# Patient Record
Sex: Male | Born: 1940
Health system: Southern US, Community
[De-identification: ages and names within clinical notes are randomized; demographics above are authoritative.]

## PROBLEM LIST (undated history)

## (undated) DIAGNOSIS — E039 Hypothyroidism, unspecified: Secondary | ICD-10-CM

## (undated) DIAGNOSIS — M791 Myalgia, unspecified site: Secondary | ICD-10-CM

## (undated) DIAGNOSIS — N200 Calculus of kidney: Secondary | ICD-10-CM

## (undated) DIAGNOSIS — J189 Pneumonia, unspecified organism: Secondary | ICD-10-CM

## (undated) DIAGNOSIS — R233 Spontaneous ecchymoses: Secondary | ICD-10-CM

## (undated) DIAGNOSIS — T4145XA Adverse effect of unspecified anesthetic, initial encounter: Secondary | ICD-10-CM

## (undated) DIAGNOSIS — K219 Gastro-esophageal reflux disease without esophagitis: Secondary | ICD-10-CM

## (undated) DIAGNOSIS — Z87442 Personal history of urinary calculi: Secondary | ICD-10-CM

## (undated) DIAGNOSIS — H269 Unspecified cataract: Secondary | ICD-10-CM

## (undated) DIAGNOSIS — G629 Polyneuropathy, unspecified: Secondary | ICD-10-CM

## (undated) DIAGNOSIS — R350 Frequency of micturition: Secondary | ICD-10-CM

## (undated) DIAGNOSIS — I739 Peripheral vascular disease, unspecified: Secondary | ICD-10-CM

## (undated) DIAGNOSIS — R600 Localized edema: Secondary | ICD-10-CM

## (undated) DIAGNOSIS — T8859XA Other complications of anesthesia, initial encounter: Secondary | ICD-10-CM

## (undated) DIAGNOSIS — K635 Polyp of colon: Secondary | ICD-10-CM

## (undated) DIAGNOSIS — R197 Diarrhea, unspecified: Secondary | ICD-10-CM

## (undated) DIAGNOSIS — IMO0002 Reserved for concepts with insufficient information to code with codable children: Secondary | ICD-10-CM

## (undated) DIAGNOSIS — I2510 Atherosclerotic heart disease of native coronary artery without angina pectoris: Secondary | ICD-10-CM

## (undated) DIAGNOSIS — J302 Other seasonal allergic rhinitis: Secondary | ICD-10-CM

## (undated) DIAGNOSIS — M549 Dorsalgia, unspecified: Secondary | ICD-10-CM

## (undated) DIAGNOSIS — R238 Other skin changes: Secondary | ICD-10-CM

## (undated) DIAGNOSIS — R609 Edema, unspecified: Secondary | ICD-10-CM

## (undated) DIAGNOSIS — R0600 Dyspnea, unspecified: Secondary | ICD-10-CM

## (undated) DIAGNOSIS — E785 Hyperlipidemia, unspecified: Secondary | ICD-10-CM

## (undated) DIAGNOSIS — M21372 Foot drop, left foot: Secondary | ICD-10-CM

## (undated) DIAGNOSIS — M1990 Unspecified osteoarthritis, unspecified site: Secondary | ICD-10-CM

## (undated) DIAGNOSIS — I10 Essential (primary) hypertension: Secondary | ICD-10-CM

## (undated) DIAGNOSIS — N289 Disorder of kidney and ureter, unspecified: Secondary | ICD-10-CM

## (undated) HISTORY — DX: Essential (primary) hypertension: I10

## (undated) HISTORY — PX: CARDIAC CATHETERIZATION: SHX172

## (undated) HISTORY — PX: TONSILLECTOMY: SUR1361

## (undated) HISTORY — PX: SHOULDER ARTHROSCOPY W/ ROTATOR CUFF REPAIR: SHX2400

## (undated) HISTORY — DX: Hyperlipidemia, unspecified: E78.5

## (undated) HISTORY — DX: Diarrhea, unspecified: R19.7

## (undated) HISTORY — DX: Peripheral vascular disease, unspecified: I73.9

## (undated) HISTORY — PX: COLONOSCOPY: SHX174

## (undated) HISTORY — PX: OTHER SURGICAL HISTORY: SHX169

## (undated) HISTORY — DX: Myalgia, unspecified site: M79.10

---

## 1998-03-31 ENCOUNTER — Emergency Department (HOSPITAL_COMMUNITY): Admission: EM | Admit: 1998-03-31 | Discharge: 1998-03-31 | Payer: Self-pay | Admitting: *Deleted

## 1998-03-31 ENCOUNTER — Encounter: Payer: Self-pay | Admitting: *Deleted

## 1999-05-23 ENCOUNTER — Ambulatory Visit (HOSPITAL_COMMUNITY): Admission: RE | Admit: 1999-05-23 | Discharge: 1999-05-23 | Payer: Self-pay | Admitting: *Deleted

## 2001-03-02 ENCOUNTER — Ambulatory Visit (HOSPITAL_COMMUNITY): Admission: RE | Admit: 2001-03-02 | Discharge: 2001-03-02 | Payer: Self-pay | Admitting: Internal Medicine

## 2001-06-21 ENCOUNTER — Encounter: Payer: Self-pay | Admitting: Interventional Cardiology

## 2001-06-21 ENCOUNTER — Ambulatory Visit (HOSPITAL_COMMUNITY): Admission: RE | Admit: 2001-06-21 | Discharge: 2001-06-21 | Payer: Self-pay | Admitting: Interventional Cardiology

## 2001-07-07 ENCOUNTER — Encounter: Payer: Self-pay | Admitting: Interventional Cardiology

## 2001-07-07 ENCOUNTER — Ambulatory Visit (HOSPITAL_COMMUNITY): Admission: RE | Admit: 2001-07-07 | Discharge: 2001-07-07 | Payer: Self-pay | Admitting: Interventional Cardiology

## 2001-08-03 ENCOUNTER — Encounter: Admission: RE | Admit: 2001-08-03 | Discharge: 2001-08-03 | Payer: Self-pay | Admitting: *Deleted

## 2001-08-03 ENCOUNTER — Encounter: Payer: Self-pay | Admitting: *Deleted

## 2003-04-24 ENCOUNTER — Ambulatory Visit (HOSPITAL_COMMUNITY): Admission: RE | Admit: 2003-04-24 | Discharge: 2003-04-24 | Payer: Self-pay | Admitting: Orthopedic Surgery

## 2003-05-22 ENCOUNTER — Encounter (INDEPENDENT_AMBULATORY_CARE_PROVIDER_SITE_OTHER): Payer: Self-pay | Admitting: Specialist

## 2003-05-22 ENCOUNTER — Ambulatory Visit (HOSPITAL_COMMUNITY): Admission: RE | Admit: 2003-05-22 | Discharge: 2003-05-22 | Payer: Self-pay | Admitting: Gastroenterology

## 2005-04-28 ENCOUNTER — Ambulatory Visit (HOSPITAL_COMMUNITY): Admission: RE | Admit: 2005-04-28 | Discharge: 2005-04-28 | Payer: Self-pay | Admitting: Internal Medicine

## 2005-09-10 ENCOUNTER — Encounter: Admission: RE | Admit: 2005-09-10 | Discharge: 2005-09-10 | Payer: Self-pay | Admitting: *Deleted

## 2005-09-18 ENCOUNTER — Ambulatory Visit (HOSPITAL_COMMUNITY): Admission: RE | Admit: 2005-09-18 | Discharge: 2005-09-18 | Payer: Self-pay | Admitting: *Deleted

## 2005-10-28 ENCOUNTER — Encounter (INDEPENDENT_AMBULATORY_CARE_PROVIDER_SITE_OTHER): Payer: Self-pay | Admitting: *Deleted

## 2005-10-28 ENCOUNTER — Inpatient Hospital Stay (HOSPITAL_COMMUNITY): Admission: RE | Admit: 2005-10-28 | Discharge: 2005-11-05 | Payer: Self-pay | Admitting: *Deleted

## 2006-04-08 ENCOUNTER — Encounter: Admission: RE | Admit: 2006-04-08 | Discharge: 2006-04-08 | Payer: Self-pay | Admitting: Orthopedic Surgery

## 2006-06-24 ENCOUNTER — Ambulatory Visit (HOSPITAL_BASED_OUTPATIENT_CLINIC_OR_DEPARTMENT_OTHER): Admission: RE | Admit: 2006-06-24 | Discharge: 2006-06-25 | Payer: Self-pay | Admitting: Orthopedic Surgery

## 2007-09-22 ENCOUNTER — Encounter: Admission: RE | Admit: 2007-09-22 | Discharge: 2007-09-22 | Payer: Self-pay | Admitting: Interventional Cardiology

## 2007-10-20 ENCOUNTER — Ambulatory Visit: Payer: Self-pay | Admitting: *Deleted

## 2007-11-24 ENCOUNTER — Ambulatory Visit: Payer: Self-pay | Admitting: *Deleted

## 2008-05-31 ENCOUNTER — Ambulatory Visit: Payer: Self-pay | Admitting: *Deleted

## 2008-11-30 ENCOUNTER — Ambulatory Visit: Payer: Self-pay | Admitting: *Deleted

## 2009-06-21 ENCOUNTER — Ambulatory Visit: Payer: Self-pay | Admitting: Vascular Surgery

## 2009-07-19 ENCOUNTER — Ambulatory Visit: Payer: Self-pay | Admitting: Vascular Surgery

## 2009-12-03 ENCOUNTER — Encounter: Admission: RE | Admit: 2009-12-03 | Discharge: 2009-12-03 | Payer: Self-pay | Admitting: Orthopedic Surgery

## 2009-12-05 ENCOUNTER — Ambulatory Visit (HOSPITAL_BASED_OUTPATIENT_CLINIC_OR_DEPARTMENT_OTHER): Admission: RE | Admit: 2009-12-05 | Discharge: 2009-12-05 | Payer: Self-pay | Admitting: Orthopedic Surgery

## 2010-03-05 ENCOUNTER — Ambulatory Visit: Payer: Self-pay | Admitting: Vascular Surgery

## 2010-08-10 LAB — BASIC METABOLIC PANEL
BUN: 27 mg/dL — ABNORMAL HIGH (ref 6–23)
CO2: 27 mEq/L (ref 19–32)
Chloride: 105 mEq/L (ref 96–112)
Creatinine, Ser: 1.34 mg/dL (ref 0.4–1.5)
GFR calc Af Amer: >60 mL/min (ref >60)
GFR calc non Af Amer: 53 mL/min — ABNORMAL LOW (ref >60)
Glucose, Bld: 91 mg/dL (ref 70–99)
Potassium: 4.5 mEq/L (ref 3.5–5.1)
Sodium: 137 mEq/L (ref 135–145)

## 2010-08-10 LAB — GLUCOSE, CAPILLARY: Glucose-Capillary: 88 mg/dL (ref 70–99)

## 2010-09-09 ENCOUNTER — Other Ambulatory Visit: Payer: Self-pay

## 2010-09-11 ENCOUNTER — Other Ambulatory Visit (INDEPENDENT_AMBULATORY_CARE_PROVIDER_SITE_OTHER): Payer: Medicare Other

## 2010-09-11 DIAGNOSIS — I6529 Occlusion and stenosis of unspecified carotid artery: Secondary | ICD-10-CM

## 2010-09-18 NOTE — Procedures (Unsigned)
CAROTID DUPLEX EXAM  INDICATION:  Carotid disease.  HISTORY: Diabetes:  Yes. Cardiac:  Angina. Hypertension:  Yes. Smoking:  Previous. Previous Surgery:  No. CV History:  Currently asymptomatic. Amaurosis Fugax No, Paresthesias No, Hemiparesis No                                      RIGHT             LEFT Brachial systolic pressure:         138               142 Brachial Doppler waveforms:         Normal            Normal Vertebral direction of flow:        Antegrade         Antegrade DUPLEX VELOCITIES (cm/sec) CCA peak systolic                   81                70 ECA peak systolic                   220               [id number] ICA peak systolic                   486               0000000 ICA end diastolic                   90                76 PLAQUE MORPHOLOGY:                  Mixed             Mixed PLAQUE AMOUNT:                      Severe            Severe PLAQUE LOCATION:                    ICA / ECA / CCA   ICA / ECA / CCA  IMPRESSION: 1. Doppler velocities suggest high end 60% to 79% stenoses of the     bilateral proximal internal carotid arteries. 2. Bilateral external carotid artery stenoses noted. 3. No significant change noted when compared to the previous exam on     03/05/2010.  ___________________________________________ Rosetta Posner, M.D.  CH/MEDQ  D:  09/12/2010  T:  09/12/2010  Job:  [id number]

## 2010-10-07 NOTE — Procedures (Signed)
CAROTID DUPLEX EXAM   INDICATION:  Follow up known carotid artery disease.   HISTORY:  Diabetes:  Yes.  Cardiac:  Angina.  Hypertension:  Yes.  Smoking:  Quit in 2007.  Previous Surgery:  No.  CV History:  Blurred vision.  Amaurosis Fugax , Paresthesias , Hemiparesis                                       RIGHT             LEFT  Brachial systolic pressure:         148               146  Brachial Doppler waveforms:         Biphasic          Biphasic  Vertebral direction of flow:        Antegrade         Antegrade  DUPLEX VELOCITIES (cm/sec)  CCA peak systolic                   81                88  ECA peak systolic                   205               [id number]  ICA peak systolic                   478               [id number]  ICA end diastolic                   60                75  PLAQUE MORPHOLOGY:                  Heterogenous      Heterogenous  PLAQUE AMOUNT:                      Moderate-to-severe                  Moderate-to-severe  PLAQUE LOCATION:                    ICA, CCA, ECA     ICA, ECA, CCA   IMPRESSION:  1. High end of 60% to 79% stenosis noted in bilateral internal carotid      arteries.  2. Antegrade vertebrals bilaterally.        ___________________________________________  Rosetta Posner, M.D.   OD/MEDQ  D:  03/05/2010  T:  03/05/2010  Job:  [id number]

## 2010-10-07 NOTE — Procedures (Signed)
CAROTID DUPLEX EXAM   INDICATION:  Follow up known carotid artery disease.   HISTORY:  Diabetes:  Yes.  Cardiac:  Angina.  Hypertension:  Yes.  Smoking:  Quit in 2007.  Previous Surgery:  CV History:  Amaurosis Fugax No, Paresthesias No, Hemiparesis No.                                       RIGHT             LEFT  Brachial systolic pressure:         150               158  Brachial Doppler waveforms:         Biphasic          Biphasic  Vertebral direction of flow:        Antegrade         Not well  visualized  DUPLEX VELOCITIES (cm/sec)  CCA peak systolic                   78                85  ECA peak systolic                   610               [id number]  ICA peak systolic                   458               [id number]  ICA end diastolic                   88                93  PLAQUE MORPHOLOGY:                  Calcified         Calcified  PLAQUE AMOUNT:                      Moderate-to-severe                  Moderate-to-severe  PLAQUE LOCATION:                    ICA, ECA, CCA     ICA, ECA, CCA   IMPRESSION:  1. High-end of 60-79% stenosis noted in bilateral internal carotid      arteries.  2. Antegrade bilateral vertebral arteries.        ___________________________________________  Rosetta Posner, M.D.   MG/MEDQ  D:  06/21/2009  T:  06/21/2009  Job:  [id number]

## 2010-10-07 NOTE — Assessment & Plan Note (Signed)
OFFICE VISIT   [name], [name]  DOB:  [birth year]                                       10/20/2007  [id number]   PRIMARY CARE PHYSICIAN:  Dr. Lavone Orn.   CHIEF COMPLAINT:  Bilateral lower extremity claudication.   HISTORY:  The patient is a 70 year old gentleman with a history of  atherosclerotic vascular disease.  He has known coronary artery disease.  He underwent abdominal aortic aneurysm repair with aortobifemoral bypass  on 10/28/2005.   He has a long history of claudication symptoms.  In reviewing his notes  I see were he was evaluated by Dr. Kellie Simmering in November of 1993 with  bilateral calf claudication.  Ambulation distance at that time with one-  half to one block.  His ankle brachial disease at that time measure 0.70  bilaterally.   Following his aortic surgery in 2007 his ankle brachial indices measure  0.55 on the right leg, 0.51 in the left leg.   He can currently walk about one block and has to discontinued due to  pain in his calves.  He has no history of rest pain or night pain.  No  nonhealing wounds on his feet.   PAST MEDICAL HISTORY:  1. Coronary artery disease.  2. Obesity.  3. Type 2 diabetes.  4. Erectile dysfunction.  5. Abdominal aortic aneurysm.  6. Rotator cuff tears.   PAST SURGICAL HISTORY:  1. Tonsillectomy.  2. Shoulder surgery.  3. AAA repair with aortobifemoral bypass.   MEDICATIONS:  Aspirin 325 mg daily, TriCor 145 mg daily, metformin 1000  mg b.i.d., glipizide 10 mg b.i.d., Actos 30 mg daily, cilostazol 100 mg  b.i.d. nadolol 80 mg daily, Caduet 10/20 one tablet daily, Altace 10 mg  daily, Zyrtec p.r.n. nasal spray p.r.n. temazepam 1 tablet nightly  p.r.n.   ALLERGIES:  Augmentin causes nausea.   FAMILY HISTORY:  Father died of heart disease.  Mother died age 77.   SOCIAL HISTORY:  Patient has discontinued tobacco use in the past couple  of years.  Was previously a one-pack per day smoker.   Denies any  significant alcohol intake.  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  Children  are grown, he is married.   PHYSICAL EXAMINATION:  Obese 70 year old male alert and oriented, in no  acute distress.  Vital Signs:  BP 139/66, pulse 55 per minute,  respirations 18 per minute.  Abdomen:  Well-healed midline scar.  Obese.  Soft and nontender.  No masses or organomegaly.  Lower extremities 2+  femoral pulses bilaterally.  No popliteal, posterior tibial, dorsalis  pedis pulses.  No significant ankle edema.  No skin ulceration or  pigmentation.   INVESTIGATIONS:  The patient has undergone a CT angiography of the  abdomen along with lower extremity runoff.  Aortobifemoral graft is  patent without apparent complications.  Bilateral superficial femoral  artery occlusions.  Three-vessel tibial runoff of the right leg, two-  vessel tibial runoff of the left leg.  Reconstitution above knee  popliteal artery bilaterally.   IMPRESSION:  1. Limiting bilateral lower extremity claudication secondary to      superficial femoral artery occlusion.  2. Coronary disease.  3. Type 2 diabetes.  4. Obesity.   MEDICAL DECISION MAKING:  I have discussed with the patient potential  options for treatment of his claudication.  I  do feel this has been  reasonably stable over the past 15 years.  No significant progression of  his symptoms.   Treatment options are primarily limited to bilateral femoral popliteal  bypass.  Gore-Tex prosthetic graft would be an option as he does have  above knee segments that are open.   All in all, I do think he is remained, however, quite stable, and  recommended to him no treatment at this time.  He seems agreeable to  this.   Incidental note is made of bilateral carotid disease which has not and  reexamine for 2 years, he will undergo a carotid Doppler evaluation.   Dorothea Glassman, M.D.  Electronically Signed   PGH/MEDQ  D:  10/20/2007  T:  10/21/2007  Job:  [id number]    cc:   Delanna Ahmadi, M.D.  Jettie Booze, MD

## 2010-10-07 NOTE — Assessment & Plan Note (Signed)
OFFICE VISIT   [name]  DOB:  07-26-1940                                       07/19/2009  CHART#:[id number]   Patient presents today for follow-up of diffuse peripheral vascular  occlusive disease.  He is a former patient of Dr. Drucie Opitz.  He had  undergone prior open repair of an infrarenal abdominal aortic aneurysm  with an aortofemoral bypass in June 2007.  He does have known bilateral  calf claudication and also has moderate-to-severe bilateral internal  carotid artery disease which is asymptomatic.  He continues to have  significant claudication symptoms.  He reports this is calf claudication  and is easy reproducible with walking, and it is relieved with rest.  He  does not have any tissue loss.  He does have a history of non-insulin  diabetes, elevated cholesterol, prior coronary artery disease.   FAMILY HISTORY:  Significant for premature atherosclerotic disease in  his mother and father.   SOCIAL HISTORY:  He is married with 2 children.  He is retired.  He does  not smoke, having quit in 2007, and has 1 or 2 alcohol drinks every 2-3  days.   REVIEW OF SYSTEMS:  Positive for weight gain up to 280 pounds.  He is 5  feet 11 inches tall.  CARDIAC:  Positive for chest pain, shortness breath with exertion.  PULMONARY:  Shortness of breath.  GI:  Occasional diarrhea.  GU:  Negative.  VASCULAR:  Claudication.  NEUROLOGIC:  Without blackouts, headaches, or seizures.  MUSCULOSKELETAL:  Chronic muscle pain.  PSYCHIATRIC:  Negative.  HEENT:  Negative.  HEMATOLOGIC:  No bleeding difficulties.   PHYSICAL EXAMINATION:  A well-developed, well-nourished, obese white  male in no acute distress.  Blood pressure 118/65, pulse 49,  respirations 16, O2 saturations are 96% on room air.  HEENT is normal.  Neck is without bruits bilaterally.  Heart is a regular rate and rhythm.  Chest:  Clear bilaterally.  Abdominal exam reveals a well-healed  midline  incision.  He does have obesity with no masses.  Musculoskeletal:  No  major deformities or cyanosis.  Neurologic:  No focal weakness or  paresthesias.  Skin without ulcers or rashes.   He underwent a repeat carotid duplex in our office on 06/21/2009.  I  have discussed this with him.  This does show the high end of the 60-79%  stenosis bilaterally with bilateral vertebral arterial flow.  We had a  discussion of this, and he knows to notify us should he develop any  neurologic deficits, otherwise we will continue with 48-month interval  duplex of his carotids.  Also, he reports that he is not comfortable  with his level of claudication.  I explained that his prior CT angiogram  in 2007 revealed that he had complete superficial femoral artery  occlusion at the origin down to the abductor canal with popliteal  reconstitution and 3-vessel runoff.  I explained that he would have to  have staged bilateral femoral to popliteal bypasses for improvement of  his walking and certainly would recommend this magnitude of undertaking  only for limiting claudication.  He wishes to continue observation only  with this.  All of his questions are answered and plan to see him again  in 6 months with a carotid duplex follow-up.     Arvilla Meres.  Early, M.D.  Electronically Signed   TFE/MEDQ  D:  07/19/2009  T:  07/19/2009  Job:  [id number]   cc:   Delanna Ahmadi, M.D.  Ninetta Lights, M.D.  Belva Crome, M.D.

## 2010-10-07 NOTE — Procedures (Signed)
CAROTID DUPLEX EXAM   INDICATION:  Followup carotid artery disease.   HISTORY:  Diabetes:  No.  Cardiac:  Periodic angina.  Hypertension:  Yes.  Smoking:  Previous.  Previous Surgery:  No.  CV History:  No.  Amaurosis Fugax No, Paresthesias No, Hemiparesis No                                       RIGHT             LEFT  Brachial systolic pressure:         150               146  Brachial Doppler waveforms:         Normal            Normal  Vertebral direction of flow:        Antegrade         Not visualized  DUPLEX VELOCITIES (cm/sec)  CCA peak systolic                   100               [id number]  ECA peak systolic                   359               [id number]  ICA peak systolic                   324               [id number]  ICA end diastolic                   55                41  PLAQUE MORPHOLOGY:                  Calcific          Calcific  PLAQUE AMOUNT:                      Moderate/severe   Moderate/severe  PLAQUE LOCATION:                    ICA/ECA           ICA/ECA/CCA   IMPRESSION:  1. 60-79% stenosis of the bilateral internal carotid arteries.  2. Bilateral external carotid artery stenosis noted.  3. No flow was visualized in the left vertebral artery.  4. No significant change noted from previous exam on 10/26/2005.  5. Doppler velocities may be underestimated due to technical      limitations.   ___________________________________________  P. Drucie Opitz, M.D.   CH/MEDQ  D:  11/24/2007  T:  11/24/2007  Job:  [id number]

## 2010-10-07 NOTE — Procedures (Signed)
CAROTID DUPLEX EXAM   INDICATION:  Follow-up evaluation of known carotid artery.   HISTORY:  Diabetes:  Yes.  Cardiac:  Periodic angina.  Hypertension:  Yes.  Smoking:  Quit in 2007.  Previous Surgery:  No.  CV History:  No.  Amaurosis Fugax No, Paresthesias No, Hemiparesis No.                                       RIGHT             LEFT  Brachial systolic pressure:         132               138  Brachial Doppler waveforms:         WNL               WNL  Vertebral direction of flow:        Antegrade         Not visualized  DUPLEX VELOCITIES (cm/sec)  CCA peak systolic                   128               [id number]  ECA peak systolic                   499               [id number]  ICA peak systolic                   455               [id number]  ICA end diastolic                   92                98  PLAQUE MORPHOLOGY:                  Calcified         Calcified  PLAQUE AMOUNT:                      Moderate to severe                  Moderate to severe  PLAQUE LOCATION:                    CCA/ICA/ECA       CCA/ICA/ECA   IMPRESSION:  Bilateral high-end 60-79% internal carotid artery stenosis.    ___________________________________________  P. Drucie Opitz, M.D.   AC/MEDQ  D:  11/30/2008  T:  11/30/2008  Job:  [id number]

## 2010-10-07 NOTE — Procedures (Signed)
CAROTID DUPLEX EXAM   INDICATION:  Follow up carotid artery disease.   HISTORY:  Diabetes:  Yes.  Cardiac:  Periodic angina.  Hypertension:  Yes.  Smoking:  Quit.  Previous Surgery:  No.  CV History:  No.  Amaurosis Fugax No.  Paresthesias No.  Hemiparesis No.                                       RIGHT             LEFT  Brachial systolic pressure:         132.              140.  Brachial Doppler waveforms:         Within normal limits.               Within normal limits.  Vertebral direction of flow:        Not visualized.   Not visualized  DUPLEX VELOCITIES (cm/sec)  CCA peak systolic                   89.               109.  ECA peak systolic                   424.              626.  ICA peak systolic                   373.              378.  ICA end diastolic                   91.               87.  PLAQUE MORPHOLOGY:                  Calcific right.   Calcific left.  PLAQUE AMOUNT:                      Moderate sized, severe.             Moderate sized, severe.  PLAQUE LOCATION:                    ICA/ECA.          ICA/ECA/CCA.   IMPRESSION:  1. Bilateral internal carotid arteries still show evidence of 60% to      79% stenosis; however, velocities have increased from previous      study.  2. Bilateral external carotid artery stenosis.  3. Right vertebral artery flow appears antegrade.  Left vertebral      artery flow was not visualized.  4. Dr. Amedeo Plenty was informed of results.       ___________________________________________  P. Belenda Cruise. Amedeo Plenty, M.D.   AS/MEDQ  D:  05/31/2008  T:  05/31/2008  Job:  [id number]

## 2010-10-10 NOTE — H&P (Signed)
NAME:  [name], [name] NO.:  [id number]   MEDICAL RECORD NO.:  [id number]           PATIENT TYPE:   LOCATION:                                 FACILITY:   PHYSICIAN:  Dorothea Glassman, M.D.         DATE OF BIRTH:   DATE OF ADMISSION:  DATE OF DISCHARGE:                                HISTORY & PHYSICAL   PRIMARY PHYSICIAN:  Dr. Lavone Orn   CARDIOLOGIST:  Dr. Daneen Schick   CHIEF COMPLAINT:  A 5.5 cm abdominal aortic aneurysm.   HISTORY OF PRESENT ILLNESS:  [name] is a 70 year old Caucasian male who  has been followed by Dr. Amedeo Plenty since October of 2003 for an abdominal aortic  aneurysm which was an incidental finding during a cardiac catheterization in  January of 2003.  At that time, his aneurysm measured 4 cm and he was  asymptomatic.  He had been followed by serial abdominal ultrasounds  approximately every 6 months.  He has also been followed for moderate  claudication since he 33 which has been treated conservatively.  He has  had little change in his abdominal aortic aneurysm; and the size of his  abdominal aortic aneurysm until the latest ultrasound August 13, 2005 which  showed the aneurysm measurements increasing from 4.6 cm to 5.3 cm.  A follow  up CT scan actually made to the aneurysm at 5.5 cm with a 2.6 cm infrarenal  aortic neck.  An arteriogram was recommended to further delineate his  anatomy; and see if he was stent graft repair candidate.  This was done on  September 18, 2005; and showed his aortic neck measuring 2.4 cm, but also showed  left hypogastric artery occlusion and high-grade stenosis of the right  hypogastric artery origin with calcified aortic bifurcation.  Due to his  hypogastric arterial disease, it was felt he would be at increased risk for  ischemic colitis; and, therefore, open repair of his aneurysm was  recommended.  After Dr. Amedeo Plenty discussed risks, benefits, and alternatives  with Mr. Tapp, he agreed to proceed.  Today,  October 26, 2005, he is at the  [facility] office for his preoperative history and physical.  He continues to be  asymptomatic, although he did describe some intermittent groin and  suprapubic pain since his Cardiolite on May 10.  He reports that initially  he had a sensation of a balloon blowing and then popping in his right groin,  soon after his Cardiolite study.  This was associated with mild pain.  He  then had a similar pain in his left groin.  This pain subsided  spontaneously, but ever since he has had some intermittent suprapubic pain  that he describes as dull and grade 2/10.  He is having none at present.  He  also denies nausea, vomiting, or any chronic back pain.  He does continue to  have claudication symptoms; and feels that he may be developing some rest  pain, as he is having some lower extremity aching at night.  He is not  having to dangle his legs  for relief, however.  He has previously been able  to walk two blocks before developing claudication symptoms.   He does get mild pedal edema with prolonged standing.  He denies any  hematochezia or dysuria.  He gets occasional constipation.  He rarely gets  reflux symptoms.  He has no history of arrhythmias, but does report  occasional pounding sensation is his chest particularly at night.  He does  occasionally get angina symptoms, which he reports maybe one over the last 2  months, for which he takes nitroglycerin p.r.n.  He has known coronary  artery disease for the past 20 years; and did get cardiac clearance by Dr.  Daneen Schick, after undergoing an adenosine Cardiolite study on Oct 01, 2005.  This was abnormal, but there was no significant change in the perfusion  pattern since his previous Cardiolite in April of 2007.  He had a normal  wall motion study with an ejection fraction of 63%.  He does report some  shortness of breath but this is mainly with lying and reports he does have  to sleep on 2 pillows.   PAST MEDICAL  HISTORY:  1.  Abdominal aortic aneurysm as described above.  2.  Coronary artery disease with history of previous inferior myocardial      infarction and known total occlusion of the right coronary artery.  3.  Peripheral vascular occlusive disease with claudication. Today's ankle-      brachial indices showing 0.55 on the right and 0.51 on the left.  4.  Diabetes mellitus type 2.  5.  Hyperlipidemia/hypertriglyceridemia.  6.  History of nasal polyps with chronic nasal congestion.  7.  Mild erectile dysfunction.  8.  Nephrolithiasis.  9.  History of left rotator cuff tendonitis.  10. Osteoarthritis.  11. Ongoing tobacco use.  12. Carotid artery disease with 60-80% stenosis bilaterally in October of      2002 with repeat carotid duplex, today, showing 60-79% bilateral disease      with an occluded left vertebral artery.  13. Recent left thumb nailbed  infection successfully treated 6 months ago.   PAST SURGICAL HISTORY:  1.  Left rotator cuff surgery in 1997.  2.  Tonsillectomy in [place].  3.  Surgery for ingrown toenails in the past.   ALLERGIES:  He develops severe nausea, vomiting with AUGMENTIN.   MEDICATIONS:  1.  Aspirin 325 mg p.o. q.a.m.  2.  TriCor 145 mg p.o. q.a.m.  3.  Metformin 500 mg 2 p.o. q.a.m. and 2 p.o. q.p.m.  4.  Glipizide 10 mg p.o. q.a.m. and q.p.m.  5.  Actos 50 mg p.o. q.a.m.  6.  Cilostazol 100 mg p.o. q.a.m. and 1 q.p.m.  7.  Nadolol 80 mg p.o. q.a.m.  8.  Caduet 10/20 mg p.o. q.a.m.  9.  Altace 10 mg 1 p.o. q.a.m., 1 p.o. q.p.m.   REVIEW OF SYSTEMS:  See history of present illness for pertinent positives  and negatives.  In addition, per the [facility]TS is review of systems form, he does  report some dry skin.  He also had a foot fracture as a teenager he believes  it was his right foot.  He has several sprains in his ankles in the past.  He also reports what he thinks is muscle wasting and has intolerance to cold secondary to peripheral vascular disease.   He wears glasses, and just  recently had a diabetic ophthalmic examination.  He has chronic nasal  congestion with nasal polyps.  He also had some recent diarrhea which is now  resolved; and was felt secondary to viral gastroenteritis.  He has some  peridontic disease and a mild nocturia.   SOCIAL HISTORY:  He is married.  He has 2 children and 3 stepchildren.  He  has smoked 1 pack a day since 1960.  He drinks 2-4 ounces of liquor a day, 1-  2 beers per month, and 1-2 ounces of wine per week.  He is an Forensic psychologist at  [employer].   FAMILY HISTORY:  His mother died at age 71; had a history of coronary artery  disease and diabetes mellitus.  His father died at age 95; and had a history  of coronary artery disease.  Maternal grandfather and grandmother both had a  history of coronary artery disease; and died around age 25.  His paternal  grandmother died in her mid-66s and had a history of diabetes mellitus.   PHYSICAL EXAM:  VITAL SIGNS:  Blood pressure 128/70, heart rate 60,  respirations 20.  GENERAL APPEARANCE:  This is a 70 year old Caucasian male who is alert,  cooperative, in no acute distress.  He is moderately obese.  HEENT:  His head is normocephalic, atraumatic.  Pupils are equal, round, and  reactive to light.  Sclerae nonicteric.  His oral mucosa is pink and moist.  He has fair dentition with evidence of mild periodontal disease.  No abscess  was noted.  NECK:  His neck was supple.  He has palpable carotid pulses.  There did  appear to be a soft bruit on the left, none on the right.  There was no  lymphadenopathy or goiter noted.  LUNGS:  Lung sounds are clear, symmetrical on inspiration, but diminished  overall.  CARDIAC:  His heart has a regular rate and rhythm. No murmur, rub or gallop  was noted, but heart sounds were rather distant.  ABDOMEN:  His abdomen is  soft, nontender, nondistended and obese.  Bowel sounds were normoactive.  I  was unable to appreciate  any hepatomegaly.  I was unable to palpate his  aneurysm.  There was no suprapubic pain on palpation.  GENITOURINARY/RECTAL:  These exams were deferred.  EXTREMITIES:  Extremities showed no edema or ulcerations.  His feet are  relatively warm, but his left forefoot is somewhat cold.  He has 2+ radial  pulses.  He has a 1+ femoral pulse on the right and 2+ femoral pulse on the  left.  He has 1+ dorsalis pedis pulse on the right; and barely palpable left  posterior tibial pulse.  NEUROLOGIC EXAM:  His neurologic exam is grossly intact.  He is alert and  oriented x4.  His speech is clear.  His gait is steady; and his muscle  strength is 5/5 in his upper and lower extremities   ASSESSMENT:  A 5.5 cm infrarenal abdominal aortic aneurysm.   PLAN:  He will be electively admitted to [facility] on October 28, 2005  to undergo open repair of his abdominal aortic aneurysm by Dr. Gordy Clement.  He saw and examined Mr. Richardson prior to this admission and  explained the risks and benefits of the above procedure. Although he is  having some suprapubic pain.  There is none at present; and is not seem  related to his abdominal aortic and aneurysm.  I discussed this with the  office surgeon, Dr. Donnetta Hutching who was in agreement.      Jacinta Shoe, P.A.  Dorothea Glassman, M.D.  Electronically Signed    AWZ/MEDQ  D:  10/26/2005  T:  10/26/2005  Job:  [id number]   cc:   Elyse Jarvis. Amedeo Plenty, M.D.  Fax: [phone]

## 2010-10-10 NOTE — Op Note (Signed)
NAME:  [name], [name] NO.:  [id number]   MEDICAL RECORD NO.:  [id number]          PATIENT TYPE:  AMB   LOCATION:  [place]                          FACILITY:  [facility]   PHYSICIAN:  Ninetta Lights, M.D. DATE OF BIRTH:  16-Sep-1940   DATE OF PROCEDURE:  06/24/2006  DATE OF DISCHARGE:                               OPERATIVE REPORT   PREOPERATIVE DIAGNOSIS:  Right shoulder longstanding impingement,  complete rotator cuff tear, supraspinatus as well as top of  subscapularis.  Tear long head of biceps tendon.  Degenerative  arthritis, shoulder.  Labral tear.  Grade 4 degenerative joint disease,  acromioclavicular joint.   POSTOPERATIVE DIAGNOSIS:  Right shoulder longstanding impingement,  complete rotator cuff tear, supraspinatus as well as top of  subscapularis.  Tear long head of biceps tendon.  Degenerative  arthritis, shoulder.  Labral tear.  Grade 4 degenerative joint disease,  acromioclavicular joint.  Massive partially retracted but still  reparable cuff tear.   PROCEDURE:  1. Right shoulder exam under anesthesia.  2. Arthroscopy.  3. Debridement of labrum, glenohumeral joint, rotator cuff.  4. Acromioplasty.  5. CA ligament release.  6. Bursectomy.  7. Excision distal clavicle.  8. Mini open repair rotator cuff tear with numerous FiberWire suture,      Concept Repair System.  9. Reimplantation biceps tendon into bicipital groove for tenodesis.   SURGEON:  Ninetta Lights, M.D.   ASSISTANT:  Alyson Locket. Velora Heckler.   ANESTHESIA:  General.   BLOOD LOSS:  Minimal.   SPECIMENS:  None.   CULTURES:  None.   COMPLICATIONS:  None.   DRESSING:  Soft compression with shoulder immobilizer.   PROCEDURE:  The patient was brought to the operating room and placed on  the operating table in the supine position.  After adequate anesthesia  had been obtained, right shoulder examined.  Full motion, good  stability.  Placed in the beach chair position and prepped and  draped in  the usual sterile fashion.  Three standard portals, anterior, posterior,  lateral.  Shoulder entered with blunt obturator.  Distended.  Arthroscope was introduced and the shoulder inspected.  Diffuse grade 2,  mild grade 3 changes in the shoulder.  Not extreme.  All of this  debrided.  Marked circumferential tearing, labrum debrided.  Complete  rupture of long head biceps tendon.  Stump of this was debrided off the  top of the glenoid.  Complete avulsion entire supraspinatus tendon and  just the very top part of the subscapularis tendon with little interval  tearing between the two.  A lot of intratendinous tearing and  degeneration.  All of this debrided.  Despite the size of the tear, this  was still relatively mobile.  After cleaning out the inside of the  shoulder, subacromial space was accessed.  Type 2 to 3 acromion.  Obvious longstanding impingement on top of this new completion of this  cuff tear.  Bursa resected.  Acromioplasty to the top of the acromion  with chamfer nicely.  Bur release of CA ligament with cautery.  Distal  clavicle grade 4 changes, marked  spurring.  Periarticular spurs and  lateral centimeter of clavicle resected.  Adequacy of decompression,  clavicle excision confirmed viewing from multiple portals.  Instruments  and fluid removed.  Deltoid-splitting incision through the lateral  portal.  Cuff was mobilized and captured with numerous FiberWire  sutures.  At the front edge I used 1 FiberWire to close the interval  tear between the subscapularis top edge and the front of the  supraspinatus.  The bicipital groove was then opened, capturing the  biceps tendon, which was still in the groove just outside the shoulder.  This was mobilized and captured with a weave FiberWire suture.  The  tuberosity freshened up and a bony trough created.  A series of drill  holes made and the sutures from the cuff brought through numerous drill  holes with the Concept  Repair System.  Arm was abducted and then the  cuff firmly sutured back in place, tying all sutures over a bony bridge.  This yielded a nice, firm watertight closure of the cuff without undue  tension even through full motion.  Bicipital groove was then freshened.  A large drill hole made there.  Two small exiting drill holes.  The  FiberWire from the biceps tendon brought in the big hole and then one  out of each of the smaller holes.  The elbow was flexed.  Sutures were  pulled tight and pulled the biceps into the large drill hole and then  the sutures tied over the top of this, insuring reimplantation  tenodesis.  This had a nice appearance.  Good reimplantation even with  the elbow extended.  The wound copiously irrigated.  Adequacy of  decompression confirmed digitally at the time of cuff repair.  Deltoid  closed with Vicryl.  Portal was closed with nylon.  The incision was  closed with subcutaneous and subcuticular Vicryl.  Margins of wound  injected with Marcaine.  Sterile compressive dressing applied.  Shoulder  immobilizer applied.  Anesthesia reversed.  Brought to recovery room.  Tolerated surgery well.  No complications.      Ninetta Lights, M.D.  Electronically Signed     DFM/MEDQ  D:  06/24/2006  T:  06/25/2006  Job:  [id number]

## 2010-10-10 NOTE — Cardiovascular Report (Signed)
[place]  Patient:    [name], [name] Visit Number: [id number] MRN: [id number]          Service Type: CAT Location: [place] 01 Attending Physician:  Belva Crome. Iii Dictated by:   Illene Labrador, M.D. Proc. Date: 06/21/01 Admit Date:  06/21/2001   CC:         Benita Stabile, M.D.  Gordy Clement, M.D.  Nelda Severe Kellie Simmering, M.D.   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 Cardiac catheterization in 1988 demonstrated total occlusion of the right coronary. The recent Cardiolite study demonstrated possible lateral wall ischemia. This study is being done to rule out significant progression of coronary artery disease. The patient also has claudication and abdominal aortography was performed because of difficulty transversing the iliac system.  PROCEDURES PERFORMED: 1. Left heart catheterization. 2. Selective coronary angiography. 3. Left ventriculography. 4. Abdominal aortography.  DESCRIPTION OF PROCEDURE: After informed consent, a 6 French sheath was inserted into the right femoral artery using the modified Seldinger technique. The 6 French A2 multipurpose catheter was used for hemodynamic recordings, left ventriculography by hand injection and selective coronary angiography. We had some difficulty getting the guide wire into the descending aorta, and ultimately had to use a Wholey wire.  After coronary angiography and left ventriculography we performed abdominal aortography using a straight 6 French pigtail catheter at 12 cc/sec. for a total of 40 cc.  Angiography was started right above the origin of the renal arteries. The pigtail catheter was placed in the abdominal aorta over a guide wire. No complications occurred during the procedure.  RESULTS:  I: HEMODYNAMIC DATA:     a. The aortic pressure 155/62 mmHg.     b.  Left ventricular pressure 155/22 mmHg.  II: LEFT VENTRICULOGRAPHY: The left ventricle is dilated. Contractility is normal. EF is at least 50%. No MR is noted.  III: SELECTIVE CORONARY ANGIOGRAPHY: Of significant clinical note, is heavy coronary  calcification involving both the right coronary, left circumflex, left main and left anterior descending. artery is normal.     a. Left main: Widely patent.     b. Left anterior descending coronary artery: This is a large vessel        that wraps around the left ventricular apex. It contains proximal        50% narrowing. No high-grade LAD focal obstruction is noted. Three        small diagonal branches arise and contain no significant obstruction.     c. Circumflex artery: The circumflex artery is large. It is tortuous. It        is heavily calcified. There is proximal 40-50% narrowing. The first        dominant obtuse marginal branch contains a relatively focal 60%        proximal stenosis. The second obtuse marginal branch is large and also        contains 50-60% distal narrowing. No high-grade or hemodynamically        significant obstruction is felt to be present in this circumflex        system.     d. Right coronary artery: The right coronary artery is heavily calcified,        totally occluded proximally.  Left to right collaterals supply        relatively intact right coronary system into the mid vessel.  IV:  ABDOMINAL AORTOGRAPHY: Abdominal aorta is atherosclerotic. Distal abdominal aortic aneurysm is noted below the renal arteries and just above the iliacs bilaterally. The right iliac contains an intimal dissection/region of obstruction that obstructs the artery by up to 50%. There is also evidence of a intimal atheromatous plaque noted in the left iliac.  CONCLUSIONS: 1. Significant coronary atherosclerotic heart disease with total occlusion    of the right coronary in the proximal vessel. There is evidence of at    least moderate  diffuse left coronary atherosclerosis with heavy    calcification noted. There is 50-60% obstruction in the each of the    obtuse marginal branches and 50% obstruction in the proximal left anterior    descending. 2. Normal left ventricular function. 3. Abdominal aortic aneurysm, distal. 4. Evidence of bilateral iliac athero-intimal obstructive disease.  PLAN: 1. Refer to vascular surgery for consideration and followup of abdominal    aortic aneurysm and iliac disease. 2. Continue medical therapy for coronary disease including aggressive    risk factor modification. Dictated by:   Illene Labrador, M.D. Attending Physician:  Belva Crome. Iii DD:  06/21/01 TD:  06/21/01 Job: [id number]

## 2010-10-10 NOTE — Discharge Summary (Signed)
NAMEJOURDEN, [name] NO.:  [id number]   MEDICAL RECORD NO.:  [id number]          PATIENT TYPE:  INP   LOCATION:  2041                         FACILITY:  [facility]   PHYSICIAN:  Dorothea Glassman, M.D.    DATE OF BIRTH:  Dec 20, 1940   DATE OF ADMISSION:  10/28/2005  DATE OF DISCHARGE:                                 DISCHARGE SUMMARY   PRIMARY DIAGNOSIS:  5.5 cm abdominal aortic aneurysm.   IN HOSPITAL DIAGNOSIS:  Postoperative confusion.   SECONDARY DIAGNOSES:  1.  Coronary artery disease with history of previous inferior myocardial      infarction and known total occlusion of the right coronary artery.  2.  Peripheral vascular occlusive disease with claudication.  ABIs showing      0.55 on the right and 0.51 on the left.  3.  Diabetes mellitus type 2.  4.  Hyperlipidemia.  5.  History of nasal polyps with chronic antral congestion.  6.  Mild erectile dysfunction.  7.  Nephrolithiasis.  8.  History of left rotator cuff tendonitis.  9.  Osteoarthritis.  10. Ongoing tobacco use.  11. History of carotid artery disease with 60-80% stenosis bilaterally with      occluded left vertebral artery.  12. Recent left thumbnail bed infection successfully treated.  13. Status post left rotator cuff surgery.  14. Status post tonsillectomy.   ALLERGIES:  Allergic to AUGMENTIN.   IN HOUSE OPERATIONS AND PROCEDURES:  Repair of infrarenal abdominal aortic  aneurysm with aortobifemoral bypass graft with reimplantation inferior  mesenteric artery using a 16 x 8 Hemashield graft.   HISTORY AND PHYSICAL AND HOSPITAL COURSE:  [name] is a 70 year old male  with obesity, diabetes and tobacco abuse. He has a known abdominal aortic  aneurysm which was recently enlarged. an abdominal aortic aneurysm which was  recently enlarged.  He has undergone complete workup including Cardiolite  evaluation, CT angiogram an arteriography.  Treatment options were discussed  with the patient in  detail including aortic stenting versus open operation.  Risks and benefits were discussed.  The patient acknowledged an  understanding and agreed to proceed. The surgery was scheduled for October 28, 2005.   The patient was taken to the operating room October 28, 2005 where he underwent  repair of infrarenal abdominal aortic aneurysm with aortobifemoral bypass  graft using a 16 x 8 Hemashield graft using reimplantation of inferior  mesenteric artery.  The patient tolerated this procedure well and was  transferred up to the intensive care unit in stable condition.  Immediately  following surgery, the patient was seen to be hemodynamically stable.  He  was extubated shortly after surgery.  The patient was seen to be alert and  oriented x3 following extubation.  During the patient's postoperative  course, he did develop postoperative ICU confusion.  This started on postop  day #2.  The patient started getting confused and agitated. He was started  on Valium.  Over the next several days, the Valium was decreased and the  patient's neuro status improved. On postop day fifth, the patient was seen  to be alert and oriented x4.  The remainder of the patient's postoperative  course was pretty much unremarkable.  He remained hemodynamically stable.  He was out of bed ambulating well.  Incisions were clean, dry and intact and  healing well.  Vital signs were monitored and seemed to be stable.  He was  seen to be afebrile.  He was able to be weaned off oxygen satting greater  than 90% on room air.  The patient was transferred out to 2000 on postop day  #4.  NG tube was discontinued postop day #1.  Clear liquids were started  postop day #4 and was advanced to regular diet by postop day #6.  The  patient tolerated a regular diet well.  No nausea or vomiting noted.  Bowel  movements within normal limits prior to discharge home.  The patient's blood  sugars were monitored during his hospital course and were  seen to be stable.  He was restarted on his home diabetic oral medication.   The patient is tentatively ready for discharge home postop day #7, November 05, 2005.  Follow-up appointment has been arranged with Dr. Amedeo [name] for November 26, 2005 11:30 a.m. [name] received instructions on diet, activity level,  and incisional care.  He was told no driving until released to do so,  no  heavy lifting over 10 pounds.  The patient was told he was allowed to shower  washing his incisions using soap and water.  He is to contact the office if  he develops any drainage or opening from any of his incision sites.  The  patient is told to ambulate 3-4 times per day, progress as tolerated and to  continue his breathing exercises.  He was educated on diet to be low-fat,  low-salt.   DISCHARGE MEDICATIONS:  1.  Oxycodone 5 mg 1-2 tablets q. 4-6 hours p.r.n. pain.  2.  Altace 10 mg b.i.d.  3.  Glipizide 10 mg b.i.d.  4.  Glucophage 1000 mg b.i.d.  5.  Actos 15 mg daily.  6.  Aspirin 325 mg daily.  7.  Tricor 145 mg daily.  8.  __________ 10/20 mg daily.  9.  Pletal 100 mg b.i.d.  10. Corgard 80 mg daily.  11. Nicotine patch change daily.      Faylene Million Dominick, PA      P. Drucie Opitz, M.D.  Electronically Signed    KMD/MEDQ  D:  11/04/2005  T:  11/04/2005  Job:  [id number]

## 2010-10-10 NOTE — Op Note (Signed)
NAME:  [name], [name] NO.:  [id number]   MEDICAL RECORD NO.:  [id number]          PATIENT TYPE:  INP   LOCATION:  2315                         FACILITY:  [facility]   PHYSICIAN:  Dorothea Glassman, M.D.    DATE OF BIRTH:  June 30, 1940   DATE OF PROCEDURE:  10/28/2005  DATE OF DISCHARGE:                                 OPERATIVE REPORT   SURGEON:  Dorothea Glassman, MD   ASSISTANT:  Jadene Pierini, PA.   ANESTHETIC:  General endotracheal.   ANESTHESIOLOGIST:  Glynda Jaeger, M.D.   PREOPERATIVE DIAGNOSIS:  5.5 cm abdominal aortic aneurysm.   POSTOPERATIVE DIAGNOSIS:  5.5 cm abdominal aortic aneurysm.   PROCEDURE.:  1.  Repair of infrarenal abdominal aortic aneurysm.  2.  Aortobifemoral bypass.  3.  Reimplantation of inferior mesenteric artery.   CLINICAL NOTE:  Birch Schetter is a 70 year old male with obesity, diabetes  and tobacco abuse.  He has a known abdominal aortic aneurysm which was  recently enlarged.  He has undergone complete workup including Cardiolite  evaluation, CT angiogram and arteriography.  Treatment options were  discussed with the patient in detail including aortic stenting versus open  operation.  The patient is brought to the operating at this time for repair  electively of the enlarging infrarenal abdominal aortic aneurysm.  Risks of  the operative procedure 3-5% for major morbidity and mortality.   OPERATIVE PROCEDURE:  The patient left the operating room stable hemodynamic  condition.  General endotracheal anesthesia induced.  Foley catheter,  arterial line, Swan-Ganz catheter in place.  The abdomen and both legs  prepped and draped in sterile fashion in the supine position.   Midline skin incision made from xiphoid to pubis.  Dissection carried  through subcutaneous tissue with electrocautery.  Linea alba incised.  Peritoneal cavity entered without difficulty.   Full laparotomy evaluation carried out.  Liver, gallbladder, bile ducts,  pancreas were normal.  There were few filmy adhesions to the gallbladder  which was mildly thickened.  No stones were felt.  The stomach and duodenum  normal.  Large bowel revealed no abnormalities.  Small intestine was normal.   Retroperitoneum did reveal an infrarenal abdominal aortic aneurysm.  Heavy  calcific iliac disease was present bilaterally extending from the aortic  bifurcation throughout the length of the iliac vessels.   The transverse colon brought superiorly.  Small bowel brought to the right.  Retroperitoneum incised over the infrarenal aorta.  Dissection carried  proximally up to skeletonize the left renal vein.  The infrarenal aorta was  cleared.  The origin of the right renal artery clearly identified.   Dissection was then carried along the aneurysm sac to the origin of the  inferior mesenteric artery which was freed and encircled with vessel loop.  The retroperitoneum was then incised along the right common iliac artery.  The right ureter was reflected inferiorly.  The distal right common iliac  artery was dissected out encircled with a heavy Tycron tie.   The peritoneal reflection along the sigmoid mesocolon was incised.  The  retroperitoneum entered lateral to the  sigmoid colon and the left common  iliac bifurcation exposed.  The left iliac system was also heavily  calcified.  Left common iliac artery encircled with a heavy Tycron tie at  its termination.   Bilateral oblique groin incisions were then made at the inguinal ligament  level.  Subcutaneous tissue and lymphatics divided electrocautery.  Bridging  veins ligated with 2-0 silk and divided.  Common femoral artery bilaterally  was freed and encircled with vessel loop.  Distal dissection carried down to  expose the origin of the profunda and superficial femoral arteries which  were each freed and encircled with vessel loops.  Common femoral arteries  bilaterally were heavily diseased with plaque.  There was  however, soft area  anteriorly.   Retroperitoneal tunnels created from the aortic bifurcation to each groin.   The patient administered 25 grams mannitol intravenously.  Received a total  of 9000 units heparin intravenously.  The infrarenal aorta controlled aortic  DeBakey clamp.  Common iliac arteries bilaterally ligated with Tycron tie.   The aneurysm sac opened longitudinally.  The laminated thrombus removed.  Backbleeding lumbar vessels controlled for gray 2-0 silk suture.  The  aneurysm sac opened up to the neck where the aorta was opened.  There was an  adequate infrarenal neck for grafting.  The 16 x 8 bifurcated Hemashield  graft was chosen.  This was anastomosed end-to-end to the infrarenal aorta  using running 3-0 Prolene suture.  At completion of the proximal anastomosis  the graft was flushed.  Each limb of the graft controlled with Fogarty  clamp.  The proximal clamp removed.  Some bleeding from the right lateral  wall was easily controlled at the anastomosis with a pledgeted 3-0 Prolene  suture.   Each limb of graft was then tunneled to respective groin.  Femoral vessels  controlled bilaterally with clamps.  Longitudinal arteriotomy made in the  common femoral artery bilaterally.  Moderate to severe plaque was noted.  Each limb of the graft was beveled and anastomosed end-to-side to respective  common femoral artery using running 5-0 Prolene suture.  At completion of  each femoral anastomosis the graft was flushed.  Femoral vessels flushed.  Clamps removed reperfusing each leg.   Attention then placed on the inferior mesenteric artery.  This was  chronically occluded.  It was resected from the aortic wall and  endarterectomized and patency reestablished.  A partial occlusion clamp  placed on the body of the aortic graft.  The graft opened 11 blade and 4  punch.  The inferior mesenteric artery was reimplanted into the graft in an end-to-side fashion using running 6-0  Prolene suture.  The completion of  this graft, the clamps removed.  Excellent flow present through the IMA.  Adequate hemostasis obtained.   The patient administered 50 mg protamine intravenously.  Hemostasis aided  with Gelfoam and thrombin.  Sponge and instrument counts correct.   Aneurysm sac was closed over the graft using running 3-0 Vicryl suture.  Retroperitoneum reapproximated with running 3-0 Vicryl suture.   The abdomen examination to ensure no retained instruments or sponges.  The  midline fascia closed with running #1 PDS suture.  Subcutaneous tissue  reapproximated loosely with interrupted 3-0 Vicryl suture.  Staples applied  to skin.  Sterile dressings applied.   The groin incisions were irrigated with saline solution.  Deep layer of the  groin incision was closed with interrupted 2-0 Vicryl suture.  Subcutaneous  layer closed with running 2-0 Vicryl suture.  Staples applied to skin.  Sterile dressings applied.   The patient tolerated procedure well.  No apparent complications.  Transferred directly to surgical intensive care unit stable hemodynamic  condition.      Dorothea Glassman, M.D.  Electronically Signed     PGH/MEDQ  D:  10/28/2005  T:  10/29/2005  Job:  [id number]   cc:   Belva Crome, M.D.  Fax: [phone]   Delanna Ahmadi, M.D.  Fax: [phone]

## 2010-10-10 NOTE — Op Note (Signed)
[name], [name]              ACCOUNT NO.:  [id number]   MEDICAL RECORD NO.:  [id number]          PATIENT TYPE:  AMB   LOCATION:  SDS                          FACILITY:  [facility]   PHYSICIAN:  Dorothea Glassman, M.D.    DATE OF BIRTH:  20-Dec-1940   DATE OF PROCEDURE:  09/18/2005  DATE OF DISCHARGE:                                 OPERATIVE REPORT   SURGEON:  Dorothea Glassman, M.D.   DISCHARGE DIAGNOSIS:  5.5 cm infrarenal abdominal aortic aneurysm.   PROCEDURE:  1.  Abdominal aortogram with pelvic runoff arteriography.  2.  Selective right hypogastric arteriogram.  3.  IVUS abdominal aorta and right common iliac artery.   ACCESSORY:  Common femoral artery 8-French sheath.   CONTRAST:  160 mL Visipaque.   COMPLICATIONS:  None apparent.   CLINICAL NOTE:  Kaden Johl is a 70 year old diabetic male with a known  abdominal aortic aneurysm.  This recently, by CT scan, was found be 5.5 cm  in size.Marland Kitchen  He is brought to the cath lab at this time for diagnostic  arteriography of intravascular ultrasound.   PROCEDURE NOTE:  The patient was brought to the cath lab in stable  condition.  She was placed in a supine position.  The right groin was  prepped and draped in a sterile fashion.  The patient received a total of  100 mcg fentanyl and 1 mg of Versed during the procedure intravenously.  The  skin and subcutaneous tissue of the right groin was instilled with 1%  Xylocaine.  The needle was easily introduced in the right common femoral  artery.  A 0.035 J-wire was passed through the needle into the mid abdominal  aorta under fluoroscopy.  The needle was removed, an 8-French sheath  advanced over guidewire, dilator removed and the sheath flushed with heparin  saline solution.   The J-wire was then advanced up into the mid abdominal aorta further into  the suprarenal aorta.  A graduated pigtail catheter was then advanced over  the guidewire.  A mid abdominal aortogram obtained.  This  revealed single  widely patent bilateral renal arteries.  The right renal artery origin was  inferior to the left renal artery origin.  There was brisk flow noted  through the superior mesenteric artery.  There was moderate angulation of  the aorta to the left estimated to be approximately 30 degrees.  There was a  2-2.5 cm infrarenal aortic neck present.   A biplane lateral aortogram was obtained.  This revealed anterior angulation  of the infrarenal neck.  The pigtail catheter was brought down to the aortic  bifurcation.  AP and bilateral oblique pelvic arteriograms were obtained.  This revealed calcified common iliac arteries bilaterally.  Moderate ectasia  of the common iliac arteries bilaterally.  The left hypogastric artery was  occluded.  The right hypogastric artery was patent, however, there was a  large calcified plaque at the origin of the left hypogastric artery.   To further delineate the disease in the left hypogastric artery, the  guidewire was then reinserted and the pigtail catheter removed.  An IMA  catheter was then placed over the guidewire and positioned in the origin of  the right hypogastric artery.  Selective right hypogastric arteriography was  obtained.  This verified a complex plaque at the origin of the right  hypogastric artery with a moderate to severe stenosis.   The guidewire was then reinserted into the abdominal aorta and the  intravascular ultrasound catheter was advanced over the guidewire.  Using  manual pullback technique, measurements were obtained of the infrarenal  aortic neck.  This measured 2.4 cm in maximal diameter.  The pullback was  brought into the right common iliac artery which measured approximately 1.1  to 1.2 cm in maximal diameter.   This completed the procedure.  The guidewire and the catheter were removed.  The right femoral sheath was removed with no apparent complications.   FINAL IMPRESSION:  1.  5.5 cm infrarenal abdominal  aortic aneurysm.  2.  Single widely patent bilateral renal arteries.  3.  Left hypogastric artery occlusion.  4.  High-grade stenosis right hypogastric artery origin.  5.  Calcified aortic bifurcation.  6.  Infrarenal aortic neck measuring 2.4 cm in diameter by IVUS.   DISPOSITION:  These results will be reviewed further with the patient in the  office for the decision made regarding management of his abdominal aortic  aneurysm.      Dorothea Glassman, M.D.  Electronically Signed     PGH/MEDQ  D:  09/18/2005  T:  09/18/2005  Job:  [id number]   cc:   Delanna Ahmadi, M.D.  Fax: [phone]   Belva Crome, M.D.  Fax: [phone]

## 2010-12-22 DIAGNOSIS — E118 Type 2 diabetes mellitus with unspecified complications: Secondary | ICD-10-CM | POA: Insufficient documentation

## 2010-12-31 ENCOUNTER — Encounter: Payer: Self-pay | Admitting: Vascular Surgery

## 2011-01-06 ENCOUNTER — Encounter: Payer: Self-pay | Admitting: Vascular Surgery

## 2011-01-06 ENCOUNTER — Ambulatory Visit (INDEPENDENT_AMBULATORY_CARE_PROVIDER_SITE_OTHER): Payer: Medicare Other | Admitting: Vascular Surgery

## 2011-01-06 ENCOUNTER — Encounter (INDEPENDENT_AMBULATORY_CARE_PROVIDER_SITE_OTHER): Payer: Medicare Other

## 2011-01-06 VITALS — BP 129/67 | HR 49 | Ht 70.0 in | Wt 297.0 lb

## 2011-01-06 DIAGNOSIS — I70219 Atherosclerosis of native arteries of extremities with intermittent claudication, unspecified extremity: Secondary | ICD-10-CM

## 2011-01-06 DIAGNOSIS — E1159 Type 2 diabetes mellitus with other circulatory complications: Secondary | ICD-10-CM

## 2011-01-06 DIAGNOSIS — R0989 Other specified symptoms and signs involving the circulatory and respiratory systems: Secondary | ICD-10-CM

## 2011-01-06 NOTE — Progress Notes (Signed)
Subjective:     Patient ID: [name], male   DOB: 23-Feb-1941, 70 y.o.   MRN: [id number]  HPI The patient presents today for continued followup of lower extremity arterial insufficiency. He had undergone aortofemoral bypass grafting for repair of an abdominal aortic aneurysm in 2007. He has had known bilateral superficial femoral artery occlusions since 2007. He recently underwent evaluation for left foot pain was felt to be neurogenic in order and this is resolved with Neurontin. He does not describe any true arterial rest pain. He does have limiting claudication bilaterally and this is equal in his right and left calf. He also has known internal carotid artery stenoses in the 60-79% range bilaterally and is followed with serial studies in our office.  Review of Systems  Constitutional: Negative.   HENT: Positive for ear pain and neck pain.   Eyes: Negative.   Respiratory: Positive for shortness of breath.   Cardiovascular: Positive for chest pain and leg swelling.  Gastrointestinal: Negative.   Genitourinary: Positive for urgency.  Musculoskeletal: Positive for myalgias, back pain, arthralgias and gait problem.  Neurological: Negative.   Hematological: Bruises/bleeds easily.  Psychiatric/Behavioral: Negative.    Past Medical History  Diagnosis Date  . Diabetes mellitus   . PVD (peripheral vascular disease)   . Chest pain   . Muscle pain   . Diarrhea   . Hypertension   . Hyperlipidemia     History  Substance Use Topics  . Smoking status: Former Smoker    Types: Cigarettes    Quit date: 10/27/2005  . Smokeless tobacco: Not on file  . Alcohol Use: Yes     occasional    Family History  Problem Relation Age of Onset  . Heart disease Mother   . Heart disease Father     Allergies  Allergen Reactions  . Augmentin Nausea Only    Current outpatient prescriptions:aspirin 325 MG tablet, Take 325 mg by mouth daily.  , Disp: , Rfl: ;  Cetirizine HCl (ZYRTEC ALLERGY PO),  Take by mouth as needed.  , Disp: , Rfl: ;  cilostazol (PLETAL) 100 MG tablet, Take 100 mg by mouth 2 (two) times daily.  , Disp: , Rfl: ;  doxycycline (VIBRA-TABS) 100 MG tablet, Take 100 mg by mouth daily. , Disp: , Rfl: ;  fenofibrate (TRICOR) 145 MG tablet, Take 145 mg by mouth daily.  , Disp: , Rfl:  gabapentin (NEURONTIN) 300 MG capsule, Take 300 mg by mouth 2 (two) times daily. , Disp: , Rfl: ;  glipiZIDE (GLUCOTROL) 10 MG tablet, Take 10 mg by mouth daily.  , Disp: , Rfl: ;  metFORMIN (GLUCOPHAGE) 500 MG tablet, Take 1,000 mg by mouth 2 (two) times daily at 8 am and 4 pm.  , Disp: , Rfl: ;  nadolol (CORGARD) 80 MG tablet, Take 80 mg by mouth daily.  , Disp: , Rfl: ;  NITROGLYCERIN PO, Take by mouth as needed.  , Disp: , Rfl:  pioglitazone (ACTOS) 30 MG tablet, Take 30 mg by mouth daily.  , Disp: , Rfl: ;  ramipril (ALTACE) 10 MG tablet, Take 10 mg by mouth 2 (two) times daily.  , Disp: , Rfl: ;  amLODipine-atorvastatin (CADUET) 5-20 MG per tablet, , Disp: , Rfl:   Filed Vitals:   01/06/11 1415  Height: 5\' 10"  (1.778 m)  Weight: 297 lb (134.718 kg)    Body mass index is 42.61 kg/(m^2).         Objective:   Physical  Exam Well-developed obese white male in no acute distress. 2+ radial pulses bilaterally. 2+ femoral pulses bilaterally. Absent popliteal and distal pulses bilaterally. Neurologically grossly intact.  Her extremity vascular lab: significant drop in left leg ABI 0.23. Able ABI on the right at 0.58    Assessment:     Stable claudication bilateral lower extremities. Significant drop in left leg ABI. The patient has no tissue loss and no arterial rest pain. He was notify us immediately should he develop any tissue loss or worsening symptoms. Otherwise we will continue to watch him as an outpatient.    Plan:     Lower extremity arterial study and office visit in 6 months

## 2011-03-09 ENCOUNTER — Emergency Department (HOSPITAL_COMMUNITY): Payer: Medicare Other

## 2011-03-09 ENCOUNTER — Emergency Department (HOSPITAL_COMMUNITY)
Admission: EM | Admit: 2011-03-09 | Discharge: 2011-03-09 | Disposition: A | Discharge status: Home / Self Care | Payer: Medicare Other | Attending: Emergency Medicine | Admitting: Emergency Medicine

## 2011-03-09 DIAGNOSIS — S0280XA Fracture of other specified skull and facial bones, unspecified side, initial encounter for closed fracture: Secondary | ICD-10-CM | POA: Insufficient documentation

## 2011-03-09 DIAGNOSIS — Z7982 Long term (current) use of aspirin: Secondary | ICD-10-CM | POA: Insufficient documentation

## 2011-03-09 DIAGNOSIS — R079 Chest pain, unspecified: Secondary | ICD-10-CM | POA: Insufficient documentation

## 2011-03-09 DIAGNOSIS — H5710 Ocular pain, unspecified eye: Secondary | ICD-10-CM | POA: Insufficient documentation

## 2011-03-09 DIAGNOSIS — Y9229 Other specified public building as the place of occurrence of the external cause: Secondary | ICD-10-CM | POA: Insufficient documentation

## 2011-03-09 DIAGNOSIS — Z79899 Other long term (current) drug therapy: Secondary | ICD-10-CM | POA: Insufficient documentation

## 2011-03-09 DIAGNOSIS — W010XXA Fall on same level from slipping, tripping and stumbling without subsequent striking against object, initial encounter: Secondary | ICD-10-CM | POA: Insufficient documentation

## 2011-03-09 DIAGNOSIS — M79609 Pain in unspecified limb: Secondary | ICD-10-CM | POA: Insufficient documentation

## 2011-03-09 DIAGNOSIS — M25539 Pain in unspecified wrist: Secondary | ICD-10-CM | POA: Insufficient documentation

## 2011-03-09 DIAGNOSIS — H1130 Conjunctival hemorrhage, unspecified eye: Secondary | ICD-10-CM | POA: Insufficient documentation

## 2011-03-09 DIAGNOSIS — S0510XA Contusion of eyeball and orbital tissues, unspecified eye, initial encounter: Secondary | ICD-10-CM | POA: Insufficient documentation

## 2011-03-09 LAB — DIFFERENTIAL
Lymphocytes Relative: 17 % (ref 12–46)
Lymphs Abs: 1 10*3/uL (ref 0.7–4.0)
Monocytes Absolute: 0.5 10*3/uL (ref 0.1–1.0)
Monocytes Relative: 9 % (ref 3–12)
Neutro Abs: 4 10*3/uL (ref 1.7–7.7)

## 2011-03-09 LAB — CBC
HCT: 37.6 % — ABNORMAL LOW (ref 39.0–52.0)
Hemoglobin: 12.8 g/dL — ABNORMAL LOW (ref 13.0–17.0)
MCH: 32.5 pg (ref 26.0–34.0)
MCHC: 34 g/dL (ref 30.0–36.0)

## 2011-03-09 LAB — POCT I-STAT, CHEM 8
Chloride: 107 mEq/L (ref 96–112)
Glucose, Bld: 64 mg/dL — ABNORMAL LOW (ref 70–99)
HCT: 41 % (ref 39.0–52.0)
Sodium: 140 mEq/L (ref 135–145)
TCO2: 24 mmol/L (ref 0–100)

## 2011-03-19 ENCOUNTER — Encounter: Payer: Self-pay | Admitting: Vascular Surgery

## 2011-03-19 ENCOUNTER — Ambulatory Visit (INDEPENDENT_AMBULATORY_CARE_PROVIDER_SITE_OTHER): Payer: Medicare Other | Admitting: Vascular Surgery

## 2011-03-19 ENCOUNTER — Other Ambulatory Visit (INDEPENDENT_AMBULATORY_CARE_PROVIDER_SITE_OTHER): Payer: Medicare Other

## 2011-03-19 ENCOUNTER — Other Ambulatory Visit (INDEPENDENT_AMBULATORY_CARE_PROVIDER_SITE_OTHER): Payer: Medicare Other | Admitting: *Deleted

## 2011-03-19 VITALS — BP 136/70 | HR 47 | Ht 71.0 in | Wt 308.0 lb

## 2011-03-19 DIAGNOSIS — I6529 Occlusion and stenosis of unspecified carotid artery: Secondary | ICD-10-CM

## 2011-03-19 DIAGNOSIS — R600 Localized edema: Secondary | ICD-10-CM

## 2011-03-19 DIAGNOSIS — R609 Edema, unspecified: Secondary | ICD-10-CM

## 2011-03-19 DIAGNOSIS — Z9181 History of falling: Secondary | ICD-10-CM

## 2011-03-19 DIAGNOSIS — M7989 Other specified soft tissue disorders: Secondary | ICD-10-CM

## 2011-03-19 NOTE — Progress Notes (Signed)
[facility] [place] HISTORY AND PHYSICAL   History of Present Illness:  Patient is a 70 y.o. year old male who presents for follow-up evaluation for carotid stenosis.  He is on Aspirin for antiplatelet therapy.  His atherosclerotic risk factors remain diabetes, elevated cholesterol, hypertension, smoking (quit 2007), and coronary artery disease (recent negative stress test per pt at Dr. Jeralene Peters Smith's office).  These are all currently stable.  He denies any new neurologic events including amaurosis, numbness, or weakness.  However, he has had several falls.  He has fallen twice in the last 9 days.  He states that he tends to trip over his right foot.  He denies syncope.  He does have a history of peripheral neuropathy.  He has had a known moderate carotid stenosis but was found to have bilateral high grade ICA stenosis on surveillance duplex today.  He also complains of swelling in the right leg after dropping a board on his right foot several days ago.  He states that he has a lot of dark discoloration of the skin initially but this has faded while the swelling has persisted.  He has history of an aortobifemoral bypass for AAA by Dr. Amedeo Plenty in 2007.  Past Medical History  Diagnosis Date  . Diabetes mellitus   . PVD (peripheral vascular disease)   . Chest pain   . Muscle pain   . Diarrhea   . Hypertension   . Hyperlipidemia     Past Surgical History  Procedure Date  . Aortobifemoral bypass   . Reimplantation of inferior mesenteric artery   . Repair of infrarenal abdominal aortic aneurysm   . Shoulder arthroscopy w/ rotator cuff repair     right    Review of Systems:  Neurologic: as above Cardiac:denies shortness of breath or chest pain Pulmonary: denies cough or wheeze Musculoskeletal: as above Skin: no rash HEENT:negative GI: no bleeding recently General: no recent wt loss or gain  Social History History  Substance Use Topics  . Smoking status: Former  Smoker    Types: Cigarettes    Quit date: 10/27/2005  . Smokeless tobacco: Not on file  . Alcohol Use: Yes     occasional    Allergies  Allergies  Allergen Reactions  . Augmentin Nausea Only     Current Outpatient Prescriptions  Medication Sig Dispense Refill  . amLODipine-atorvastatin (CADUET) 5-20 MG per tablet       . aspirin 325 MG tablet Take 325 mg by mouth daily.        . Cetirizine HCl (ZYRTEC ALLERGY PO) Take by mouth as needed.        . cilostazol (PLETAL) 100 MG tablet Take 100 mg by mouth 2 (two) times daily.        Marland Kitchen doxycycline (VIBRA-TABS) 100 MG tablet Take 100 mg by mouth daily.       . fenofibrate (TRICOR) 145 MG tablet Take 145 mg by mouth daily.        Marland Kitchen gabapentin (NEURONTIN) 300 MG capsule Take 300 mg by mouth 2 (two) times daily.       Marland Kitchen glipiZIDE (GLUCOTROL) 10 MG tablet Take 10 mg by mouth daily.        . metFORMIN (GLUCOPHAGE) 500 MG tablet Take 1,000 mg by mouth 2 (two) times daily at 8 am and 4 pm.        . nadolol (CORGARD) 80 MG tablet Take 80 mg by mouth daily.        Marland Kitchen  naproxen (NAPROSYN) 375 MG tablet Take 375 mg by mouth 2 (two) times daily with a meal.        . NITROGLYCERIN PO Take by mouth as needed.        . pioglitazone (ACTOS) 30 MG tablet Take 30 mg by mouth daily.        . ramipril (ALTACE) 10 MG tablet Take 10 mg by mouth 2 (two) times daily.          Physical Examination  Filed Vitals:   03/19/11 1622 03/19/11 1623  BP: 131/73 136/70  Pulse: 45 47  Height: 5\' 11"  (1.803 m)   Weight: 308 lb (139.708 kg)   SpO2: 92%     Body mass index is 42.96 kg/(m^2).  General:  Alert and oriented, no acute distress HEENT: Normal Neck: No bruit or JVD Pulmonary: Clear to auscultation bilaterally Cardiac: Regular Rate and Rhythm without murmur Neurologic: Upper and lower extremity motor 5/5 and symmetric  DATA: A duplex scan today was reviewed and interpreted. Right internal carotid artery stenosis was greater than 80% with a peak  systolic velocity of XX123456 cm/s and end-diastolic velocity of XX123456 cm/s, left side had a peak systolic velocity of 0000000 cm second with end-diastolic velocity of 0000000 cm segment but these lesions were heavily calcified. Both were greater than 80%. There is also evidence of common carotid disease as well. The internal carotid artery did appear normal above the level of stenosis.  The patient also had a venous duplex exam to rule out DVT in his right lower extremity which I also reviewed and interpreted. This showed no evidence of DVT.   ASSESSMENT: Bilateral high grade internal carotid artery stenosis which I would consider currently to be asymptomatic. I do not believe his falls are related to his carotid disease butcertainly he is at high risk of stroke.   PLAN: We'll obtain a CT angiogram of the neck to assess his great vessels proximally. Based on the findings of the scan will make a determination whether he is a candidate for carotid endarterectomy. He will followup next week. He will continue to take his aspirin.  Ruta Hinds, MD [facility] [place] Office: [phone] Pager: [phone]

## 2011-03-20 NOTE — Progress Notes (Signed)
Addended by: Mena Goes on: 03/20/2011 10:32 AM   Modules accepted: Orders

## 2011-03-20 NOTE — Progress Notes (Signed)
Addended by: Mena Goes on: 03/20/2011 09:30 AM   Modules accepted: Orders

## 2011-03-23 ENCOUNTER — Ambulatory Visit: Payer: Medicare Other | Attending: Internal Medicine | Admitting: Physical Therapy

## 2011-03-23 DIAGNOSIS — R262 Difficulty in walking, not elsewhere classified: Secondary | ICD-10-CM | POA: Insufficient documentation

## 2011-03-23 DIAGNOSIS — IMO0001 Reserved for inherently not codable concepts without codable children: Secondary | ICD-10-CM | POA: Insufficient documentation

## 2011-03-25 ENCOUNTER — Encounter: Payer: Self-pay | Admitting: Vascular Surgery

## 2011-03-25 NOTE — Procedures (Unsigned)
CAROTID DUPLEX EXAM  INDICATION:  Follow up carotid disease.  HISTORY: Diabetes:  Yes. Cardiac:  No. Hypertension:  Yes. Smoking:  Yes. Previous Surgery:  No. CV History: Amaurosis Fugax No, Paresthesias No, Hemiparesis No.                                      RIGHT                   LEFT Brachial systolic pressure: Brachial Doppler waveforms: Vertebral direction of flow:        Abnormal antegrade      Abnormal antegrade DUPLEX VELOCITIES (cm/sec) CCA peak systolic                   76                      65 ECA peak systolic                   503                     [id number] ICA peak systolic                   570                     0000000 ICA end diastolic                   100 - 140               128 PLAQUE MORPHOLOGY:                  Calcific                Calcific PLAQUE AMOUNT:                      Severe                  Severe PLAQUE LOCATION:                    CCA/ECA/ICA/subclavian CCA/ECA/ICA/subclavian  IMPRESSION: 1. 80% to 99% bilateral internal carotid artery stenosis with calcific     plaque in the bifurcation.  Velocities were beyond the spectral     Doppler limits.  Significant post-stenotic turbulence is observed     bilaterally; however, the distal internal carotid arteries are both     patent. 2. Bilateral common carotid artery waveforms demonstrate mid systolic     deceleration. 3. Bilateral vertebral arterial waveforms are abnormal. 4. Bilateral external carotid artery stenosis is observed. 5. There is significant bilateral subclavian stenosis with velocities     on the right of 407 cm/s and velocities on the left of 256 cm/s. 6. Results were given to Dr. Oneida Alar in the clinic on 03/19/2011.  ___________________________________________ Rosetta Posner, M.D.  LT/MEDQ  D:  03/19/2011  T:  03/19/2011  Job:  [id number]

## 2011-03-25 NOTE — Procedures (Unsigned)
DUPLEX DEEP VENOUS EXAM - LOWER EXTREMITY  INDICATION:  Right lower extremity edema with ecchymosis.  HISTORY:  Edema:  Right lower extremity. Trauma/Surgery:  The patient has fallen several times in the past 10 days. Pain:  Yes. PE:  No. Previous DVT:  No. Anticoagulants: Other:  DUPLEX EXAM:               CFV   SFV   PopV  PTV    GSV               R  L  R  L  R  L  R   L  R  L Thrombosis    o  o  o     o     o      o Spontaneous   +  +  +     + Phasic        +  +  +     + Augmentation  +  +  +     + Compressible  +  +  +     +     + Competent     o     +     +  Legend:  + - yes  o - no  p - partial  D - decreased  IMPRESSION: 1. No evidence of right lower extremity deep venous thrombosis or     superficial thrombophlebitis. 2. Note:  A technically difficult examination secondary to the     patient's large body habitus and the presence of moderate right     lower extremity edema.   _____________________________ Jessy Oto. Fields, MD  CI/MEDQ  D:  03/19/2011  T:  03/19/2011  Job:  [id number]

## 2011-03-26 ENCOUNTER — Encounter: Payer: Self-pay | Admitting: Vascular Surgery

## 2011-03-26 ENCOUNTER — Ambulatory Visit (INDEPENDENT_AMBULATORY_CARE_PROVIDER_SITE_OTHER): Payer: Medicare Other | Admitting: Vascular Surgery

## 2011-03-26 ENCOUNTER — Ambulatory Visit
Admission: RE | Admit: 2011-03-26 | Discharge: 2011-03-26 | Disposition: A | Discharge status: Home / Self Care | Payer: Medicare Other | Source: Ambulatory Visit | Attending: Vascular Surgery | Admitting: Vascular Surgery

## 2011-03-26 VITALS — BP 143/59 | HR 54 | Ht 71.0 in | Wt 306.0 lb

## 2011-03-26 DIAGNOSIS — I6529 Occlusion and stenosis of unspecified carotid artery: Secondary | ICD-10-CM

## 2011-03-26 DIAGNOSIS — E1159 Type 2 diabetes mellitus with other circulatory complications: Secondary | ICD-10-CM

## 2011-03-26 DIAGNOSIS — I70219 Atherosclerosis of native arteries of extremities with intermittent claudication, unspecified extremity: Secondary | ICD-10-CM

## 2011-03-26 DIAGNOSIS — Z9181 History of falling: Secondary | ICD-10-CM

## 2011-03-26 MED ORDER — IOHEXOL 350 MG/ML SOLN
100.0000 mL | Freq: Once | INTRAVENOUS | Status: AC | PRN
  Administered 2011-03-26: 100 mL via INTRAVENOUS

## 2011-03-26 NOTE — Progress Notes (Signed)
[facility] [place] HISTORY AND PHYSICAL   History of Present Illness:  Patient is a 70 y.o. year old male who presents for follow-up evaluation for carotid stenosis.  He is on Aspirin for antiplatelet therapy.  His atherosclerotic risk factors remain diabetes, elevated cholesterol, and hypertension..  These are all currently stable and followed by his primary care physician.  He denies any new neurologic events including amaurosis, numbness, or weakness. He has had no further falls since his last office visit. He returns today after a CT angiogram of the neck.  Past Medical History  Diagnosis Date  . Diabetes mellitus   . PVD (peripheral vascular disease)   . Chest pain   . Muscle pain   . Diarrhea   . Hypertension   . Hyperlipidemia     Past Surgical History  Procedure Date  . Aortobifemoral bypass   . Reimplantation of inferior mesenteric artery   . Repair of infrarenal abdominal aortic aneurysm   . Shoulder arthroscopy w/ rotator cuff repair     right    Review of Systems:  Neurologic: as above Cardiac:denies shortness of breath or chest pain Pulmonary: denies cough or wheeze  Social History History  Substance Use Topics  . Smoking status: Former Smoker    Types: Cigarettes    Quit date: 10/27/2005  . Smokeless tobacco: Not on file  . Alcohol Use: Yes     occasional    Allergies  Allergies  Allergen Reactions  . Augmentin Nausea Only     Current Outpatient Prescriptions  Medication Sig Dispense Refill  . amLODipine-atorvastatin (CADUET) 5-20 MG per tablet       . aspirin 325 MG tablet Take 325 mg by mouth daily.        . Cetirizine HCl (ZYRTEC ALLERGY PO) Take by mouth as needed.        . cilostazol (PLETAL) 100 MG tablet Take 100 mg by mouth 2 (two) times daily.        Marland Kitchen doxycycline (VIBRA-TABS) 100 MG tablet Take 100 mg by mouth daily.       . fenofibrate (TRICOR) 145 MG tablet Take 145 mg by mouth daily.        Marland Kitchen gabapentin  (NEURONTIN) 300 MG capsule Take 300 mg by mouth 2 (two) times daily.       Marland Kitchen glipiZIDE (GLUCOTROL) 10 MG tablet Take 10 mg by mouth daily.        . metFORMIN (GLUCOPHAGE) 500 MG tablet Take 1,000 mg by mouth 2 (two) times daily at 8 am and 4 pm.        . nadolol (CORGARD) 80 MG tablet Take 80 mg by mouth daily.        . naproxen (NAPROSYN) 375 MG tablet Take 375 mg by mouth 2 (two) times daily with a meal.        . NITROGLYCERIN PO Take by mouth as needed.        . pioglitazone (ACTOS) 30 MG tablet Take 30 mg by mouth daily.        . ramipril (ALTACE) 10 MG tablet Take 10 mg by mouth 2 (two) times daily.         No current facility-administered medications for this visit.   Facility-Administered Medications Ordered in Other Visits  Medication Dose Route Frequency Provider Last Rate Last Dose  . iohexol (OMNIPAQUE) 350 MG/ML injection 100 mL  100 mL Intravenous Once PRN Medication Radiologist   100 mL at 03/26/11 1525  Physical Examination  Filed Vitals:   03/26/11 1604  BP: 143/59  Pulse: 54  Height: 5\' 11"  (1.803 m)  Weight: 306 lb (138.801 kg)  SpO2: 95%    Body mass index is 42.68 kg/(m^2).  General:  Alert and oriented, no acute distress HEENT: Normal Neck: No bruit or JVD Neurologic: Upper and lower extremity motor 5/5 and symmetric Extremities: Persistent edema right lower extremity  DATA: CT scan of the head dated 03/09/2011 as well as CT angiogram of the neck dated 03/26/2011 were reviewed today. The CT scan of the head shows no evidence of acute infarct. He CT angiogram shows a 50% stenosis of the proximal right common carotid artery with a high-grade stenosis at the carotid bifurcation which is heavily calcified. The left carotid bifurcation is also heavily calcified. There does not appear to be significant proximal left common carotid stenosis.   ASSESSMENT: Bilateral calcified high grade internal carotid artery stenosis. He also has some component of common carotid  stenosis proximally on the right side.   PLAN: Lengthy discussion was held with the patient today regarding treatment options of his internal carotid artery stenosis. I discussed with him medical management versus surgical management of his carotid stenosis. I informed him his risk of stroke from carotid endarterectomy would be on the order of about 2-3% due to to the severe nature of his carotid disease bilaterally. I discussed with him that his risk of stroke with medical management including antiplatelet therapy and risk factor modification would be on the order of 5-10% per year. I also discussed with them that some of his falls recently could be related to global cerebral hypoperfusion. I discussed and thorough detail the carotid endarterectomy procedure. I believe the best option at this point would be to perform a left carotid endarterectomy. We would then follow this with a carotid angiogram several weeks later to fully evaluate his right common carotid stenosis as well as his right internal carotid artery stenosis. I believe the left side can be treated through a cervical approach alone. The right side may take a combination of common carotid stenting and carotid endarterectomy or possible central reconstruction. I also discussed with the patient today other risks such as myocardial events 3-5%, cranial nerve injury 10-15%, bleeding and infection 1-2%.  This discussion was held over approximately 15-30 minutes. At the conclusion of our discussion the patient has decided to undergo left carotid endarterectomy. This is scheduled for 04/07/2011. Ruta Hinds, MD [facility] [place] Office: [phone] Pager: [phone]

## 2011-04-02 ENCOUNTER — Encounter (HOSPITAL_COMMUNITY): Payer: Self-pay | Admitting: Pharmacy Technician

## 2011-04-03 ENCOUNTER — Encounter (HOSPITAL_COMMUNITY)
Admission: RE | Admit: 2011-04-03 | Discharge: 2011-04-03 | Disposition: A | Discharge status: Home / Self Care | Payer: Medicare Other | Source: Ambulatory Visit | Attending: Vascular Surgery | Admitting: Vascular Surgery

## 2011-04-03 ENCOUNTER — Other Ambulatory Visit: Payer: Self-pay

## 2011-04-03 ENCOUNTER — Other Ambulatory Visit: Payer: Self-pay | Admitting: *Deleted

## 2011-04-03 ENCOUNTER — Encounter (HOSPITAL_COMMUNITY): Payer: Self-pay

## 2011-04-03 HISTORY — DX: Polyneuropathy, unspecified: G62.9

## 2011-04-03 HISTORY — DX: Frequency of micturition: R35.0

## 2011-04-03 HISTORY — DX: Disorder of kidney and ureter, unspecified: N28.9

## 2011-04-03 HISTORY — DX: Atherosclerotic heart disease of native coronary artery without angina pectoris: I25.10

## 2011-04-03 HISTORY — DX: Unspecified osteoarthritis, unspecified site: M19.90

## 2011-04-03 HISTORY — DX: Other seasonal allergic rhinitis: J30.2

## 2011-04-03 HISTORY — DX: Edema, unspecified: R60.9

## 2011-04-03 HISTORY — DX: Pneumonia, unspecified organism: J18.9

## 2011-04-03 HISTORY — DX: Polyp of colon: K63.5

## 2011-04-03 HISTORY — DX: Dorsalgia, unspecified: M54.9

## 2011-04-03 HISTORY — DX: Gastro-esophageal reflux disease without esophagitis: K21.9

## 2011-04-03 HISTORY — DX: Calculus of kidney: N20.0

## 2011-04-03 LAB — URINALYSIS, MICROSCOPIC ONLY
Glucose, UA: NEGATIVE mg/dL
Hgb urine dipstick: NEGATIVE
Ketones, ur: NEGATIVE mg/dL
Leukocytes, UA: NEGATIVE
Protein, ur: NEGATIVE mg/dL
Urobilinogen, UA: 0.2 mg/dL (ref 0.0–1.0)

## 2011-04-03 LAB — COMPREHENSIVE METABOLIC PANEL
AST: 20 U/L (ref 0–37)
CO2: 25 mEq/L (ref 19–32)
Chloride: 104 mEq/L (ref 96–112)
Creatinine, Ser: 1.05 mg/dL (ref 0.50–1.35)
GFR calc Af Amer: 80 mL/min — ABNORMAL LOW (ref >90)
GFR calc non Af Amer: 69 mL/min — ABNORMAL LOW (ref >90)
Glucose, Bld: 111 mg/dL — ABNORMAL HIGH (ref 70–99)
Total Bilirubin: 0.3 mg/dL (ref 0.3–1.2)

## 2011-04-03 LAB — CBC
HCT: 37.6 % — ABNORMAL LOW (ref 39.0–52.0)
Hemoglobin: 12.3 g/dL — ABNORMAL LOW (ref 13.0–17.0)
MCH: 31.1 pg (ref 26.0–34.0)
MCV: 95.2 fL (ref 78.0–100.0)
Platelets: 256 10*3/uL (ref 150–400)
RBC: 3.95 MIL/uL — ABNORMAL LOW (ref 4.22–5.81)
WBC: 5.4 10*3/uL (ref 4.0–10.5)

## 2011-04-03 LAB — PROTIME-INR: INR: 1.06 (ref 0.00–1.49)

## 2011-04-03 NOTE — Progress Notes (Signed)
Pt states that when he lays flat he has a difficult time

## 2011-04-03 NOTE — Pre-Procedure Instructions (Signed)
[place]  04/03/2011   Your procedure is scheduled on:Wed.Nov 14th @ 0830 Report to [facility] at  [phone].  Call this number if you have problems the morning of surgery: [phone]   Remember:   Do not eat food:After Midnight.  Do not drink clear liquids: 4 Hours before arrival.  Take these medicines the morning of surgery with A SIP OF WATER: Amlodipine,Zyrtec,Neuontin,Nadolol,pain pill(if needed)   Do not wear jewelry, make-up or nail polish.  Do not wear lotions, powders, or perfumes. You may wear deodorant.  Do not shave 48 hours prior to surgery.  Do not bring valuables to the hospital.  Contacts, dentures or bridgework may not be worn into surgery.  Leave suitcase in the car. After surgery it may be brought to your room.  For patients admitted to the hospital, checkout time is 11:00 AM the day of discharge.   Patients discharged the day of surgery will not be allowed to drive home.  Name and phone number of your driver:wife  Special Instructions: CHG Shower Use Special Wash: 1/2 bottle night before surgery and 1/2 bottle morning of surgery.   Please read over the following fact sheets that you were given: Pain Booklet, Coughing and Deep Breathing, Blood Transfusion Information, MRSA Information and Surgical Site Infection Prevention

## 2011-04-03 NOTE — Progress Notes (Signed)
Cardiologist is dr.smith

## 2011-04-07 MED ORDER — SODIUM CHLORIDE 0.9 % IV SOLN: INTRAVENOUS | Status: DC

## 2011-04-07 MED ORDER — VANCOMYCIN HCL 500 MG IV SOLR: 500.0000 mg | Freq: Once | INTRAVENOUS | Status: DC

## 2011-04-07 MED ORDER — VANCOMYCIN HCL 1000 MG IV SOLR
1500.0000 mg | Freq: Once | INTRAVENOUS | Status: AC
  Administered 2011-04-08: 1500 mg via INTRAVENOUS
  Filled 2011-04-07 (×2): qty 1500

## 2011-04-08 ENCOUNTER — Other Ambulatory Visit: Payer: Self-pay | Admitting: Vascular Surgery

## 2011-04-08 ENCOUNTER — Inpatient Hospital Stay (HOSPITAL_COMMUNITY)
Admission: RE | Admit: 2011-04-08 | Discharge: 2011-04-09 | DRG: 039 | Disposition: A | Discharge status: Home / Self Care | Payer: Medicare Other | Source: Ambulatory Visit | Attending: Vascular Surgery | Admitting: Vascular Surgery

## 2011-04-08 ENCOUNTER — Encounter (HOSPITAL_COMMUNITY)
Admission: RE | Disposition: A | Discharge status: Home / Self Care | Payer: Self-pay | Source: Ambulatory Visit | Attending: Vascular Surgery

## 2011-04-08 ENCOUNTER — Inpatient Hospital Stay (HOSPITAL_COMMUNITY): Payer: Medicare Other | Admitting: Anesthesiology

## 2011-04-08 ENCOUNTER — Encounter (HOSPITAL_COMMUNITY): Payer: Self-pay | Admitting: *Deleted

## 2011-04-08 ENCOUNTER — Encounter (HOSPITAL_COMMUNITY): Payer: Self-pay

## 2011-04-08 ENCOUNTER — Encounter (HOSPITAL_COMMUNITY): Payer: Self-pay | Admitting: Anesthesiology

## 2011-04-08 DIAGNOSIS — M129 Arthropathy, unspecified: Secondary | ICD-10-CM | POA: Diagnosis present

## 2011-04-08 DIAGNOSIS — Z87891 Personal history of nicotine dependence: Secondary | ICD-10-CM

## 2011-04-08 DIAGNOSIS — Z860100 Personal history of colon polyps, unspecified: Secondary | ICD-10-CM

## 2011-04-08 DIAGNOSIS — I739 Peripheral vascular disease, unspecified: Secondary | ICD-10-CM | POA: Diagnosis present

## 2011-04-08 DIAGNOSIS — Z79899 Other long term (current) drug therapy: Secondary | ICD-10-CM

## 2011-04-08 DIAGNOSIS — I2510 Atherosclerotic heart disease of native coronary artery without angina pectoris: Secondary | ICD-10-CM | POA: Diagnosis present

## 2011-04-08 DIAGNOSIS — Z9181 History of falling: Secondary | ICD-10-CM

## 2011-04-08 DIAGNOSIS — I6529 Occlusion and stenosis of unspecified carotid artery: Principal | ICD-10-CM | POA: Diagnosis present

## 2011-04-08 DIAGNOSIS — Z7982 Long term (current) use of aspirin: Secondary | ICD-10-CM

## 2011-04-08 DIAGNOSIS — Z01812 Encounter for preprocedural laboratory examination: Secondary | ICD-10-CM

## 2011-04-08 DIAGNOSIS — R350 Frequency of micturition: Secondary | ICD-10-CM | POA: Diagnosis present

## 2011-04-08 DIAGNOSIS — E119 Type 2 diabetes mellitus without complications: Secondary | ICD-10-CM | POA: Diagnosis present

## 2011-04-08 DIAGNOSIS — E785 Hyperlipidemia, unspecified: Secondary | ICD-10-CM | POA: Diagnosis present

## 2011-04-08 DIAGNOSIS — I10 Essential (primary) hypertension: Secondary | ICD-10-CM | POA: Diagnosis present

## 2011-04-08 DIAGNOSIS — I658 Occlusion and stenosis of other precerebral arteries: Secondary | ICD-10-CM | POA: Diagnosis present

## 2011-04-08 DIAGNOSIS — Z01810 Encounter for preprocedural cardiovascular examination: Secondary | ICD-10-CM

## 2011-04-08 DIAGNOSIS — G609 Hereditary and idiopathic neuropathy, unspecified: Secondary | ICD-10-CM | POA: Diagnosis present

## 2011-04-08 HISTORY — PX: ENDARTERECTOMY: SHX5162

## 2011-04-08 HISTORY — PX: CAROTID ENDARTERECTOMY: SUR193

## 2011-04-08 LAB — GLUCOSE, CAPILLARY
Glucose-Capillary: 105 mg/dL — ABNORMAL HIGH (ref 70–99)
Glucose-Capillary: 95 mg/dL (ref 70–99)
Glucose-Capillary: 89 mg/dL (ref 70–99)

## 2011-04-08 LAB — TYPE AND SCREEN: Antibody Screen: NEGATIVE

## 2011-04-08 SURGERY — ENDARTERECTOMY, CAROTID
Anesthesia: General | Site: Neck | Laterality: Left | Wound class: Clean

## 2011-04-08 MED ORDER — NITROGLYCERIN IN D5W 200-5 MCG/ML-% IV SOLN: 5.0000 ug/min | INTRAVENOUS | Status: DC

## 2011-04-08 MED ORDER — SOLIFENACIN SUCCINATE 5 MG PO TABS
5.0000 mg | ORAL_TABLET | Freq: Every day | ORAL | Status: DC
  Filled 2011-04-08: qty 1

## 2011-04-08 MED ORDER — SODIUM CHLORIDE 0.9 % IR SOLN
Status: DC | PRN
  Administered 2011-04-08: 11:00:00

## 2011-04-08 MED ORDER — NAPROXEN 250 MG PO TABS
250.0000 mg | ORAL_TABLET | Freq: Two times a day (BID) | ORAL | Status: DC
  Administered 2011-04-08 – 2011-04-09 (×2): 250 mg via ORAL
  Filled 2011-04-08 (×3): qty 1

## 2011-04-08 MED ORDER — ACETAMINOPHEN 325 MG PO TABS: 325.0000 mg | ORAL_TABLET | ORAL | Status: DC | PRN

## 2011-04-08 MED ORDER — NADOLOL 80 MG PO TABS
80.0000 mg | ORAL_TABLET | Freq: Every day | ORAL | Status: DC
  Administered 2011-04-09: 80 mg via ORAL
  Filled 2011-04-08 (×2): qty 1

## 2011-04-08 MED ORDER — NAPROXEN SODIUM ER 500 MG PO TB24: 500.0000 mg | ORAL_TABLET | Freq: Every day | ORAL | Status: DC

## 2011-04-08 MED ORDER — POTASSIUM CHLORIDE CRYS ER 20 MEQ PO TBCR: 20.0000 meq | EXTENDED_RELEASE_TABLET | Freq: Once | ORAL | Status: AC | PRN

## 2011-04-08 MED ORDER — BISACODYL 10 MG RE SUPP: 10.0000 mg | Freq: Every day | RECTAL | Status: DC | PRN

## 2011-04-08 MED ORDER — DARIFENACIN HYDROBROMIDE ER 7.5 MG PO TB24
7.5000 mg | ORAL_TABLET | Freq: Every day | ORAL | Status: DC
  Administered 2011-04-09: 7.5 mg via ORAL
  Filled 2011-04-08 (×2): qty 1

## 2011-04-08 MED ORDER — DEXTROSE 5 % IV SOLN
1.5000 g | Freq: Two times a day (BID) | INTRAVENOUS | Status: AC
  Administered 2011-04-08 – 2011-04-09 (×2): 1.5 g via INTRAVENOUS
  Filled 2011-04-08 (×2): qty 1.5

## 2011-04-08 MED ORDER — POLYETHYLENE GLYCOL 3350 17 G PO PACK
17.0000 g | PACK | Freq: Every day | ORAL | Status: DC | PRN
  Filled 2011-04-08: qty 1

## 2011-04-08 MED ORDER — METFORMIN HCL 500 MG PO TABS
1000.0000 mg | ORAL_TABLET | Freq: Two times a day (BID) | ORAL | Status: DC
  Administered 2011-04-08 – 2011-04-09 (×2): 1000 mg via ORAL
  Filled 2011-04-08 (×4): qty 2

## 2011-04-08 MED ORDER — SODIUM CHLORIDE 0.9 % IV SOLN
INTRAVENOUS | Status: DC
  Administered 2011-04-08: 10:00:00 via INTRAVENOUS

## 2011-04-08 MED ORDER — HYDRALAZINE HCL 20 MG/ML IJ SOLN
10.0000 mg | INTRAMUSCULAR | Status: DC | PRN
  Filled 2011-04-08: qty 0.5

## 2011-04-08 MED ORDER — B COMPLEX VITAMINS PO CAPS: 1.0000 | ORAL_CAPSULE | Freq: Every day | ORAL | Status: DC

## 2011-04-08 MED ORDER — POTASSIUM CHLORIDE IN NACL 20-0.9 MEQ/L-% IV SOLN
INTRAVENOUS | Status: DC
  Administered 2011-04-08: 18:00:00 via INTRAVENOUS
  Filled 2011-04-08 (×3): qty 1000

## 2011-04-08 MED ORDER — AMLODIPINE-ATORVASTATIN 5-20 MG PO TABS
1.0000 | ORAL_TABLET | Freq: Every day | ORAL | Status: DC
Start: 2011-04-08 — End: 2011-04-08

## 2011-04-08 MED ORDER — ASPIRIN 325 MG PO TABS
325.0000 mg | ORAL_TABLET | Freq: Once | ORAL | Status: AC
  Administered 2011-04-08: 325 mg via ORAL
  Filled 2011-04-08: qty 1

## 2011-04-08 MED ORDER — CILOSTAZOL 100 MG PO TABS
100.0000 mg | ORAL_TABLET | Freq: Two times a day (BID) | ORAL | Status: DC
Start: 2011-04-08 — End: 2011-04-09
  Administered 2011-04-08 – 2011-04-09 (×2): 100 mg via ORAL
  Filled 2011-04-08 (×3): qty 1

## 2011-04-08 MED ORDER — GABAPENTIN 300 MG PO CAPS
300.0000 mg | ORAL_CAPSULE | Freq: Two times a day (BID) | ORAL | Status: DC
  Administered 2011-04-08 – 2011-04-09 (×2): 300 mg via ORAL
  Filled 2011-04-08 (×3): qty 1

## 2011-04-08 MED ORDER — B COMPLEX-C PO TABS
1.0000 | ORAL_TABLET | Freq: Every day | ORAL | Status: DC
  Administered 2011-04-08 – 2011-04-09 (×2): 1 via ORAL
  Filled 2011-04-08 (×2): qty 1

## 2011-04-08 MED ORDER — DOPAMINE-DEXTROSE 3.2-5 MG/ML-% IV SOLN: 3.0000 ug/kg/min | INTRAVENOUS | Status: DC

## 2011-04-08 MED ORDER — ZOLPIDEM TARTRATE 5 MG PO TABS: 5.0000 mg | ORAL_TABLET | Freq: Every evening | ORAL | Status: DC | PRN

## 2011-04-08 MED ORDER — ALUM & MAG HYDROXIDE-SIMETH 400-400-40 MG/5ML PO SUSP
15.0000 mL | ORAL | Status: DC | PRN
  Filled 2011-04-08: qty 30

## 2011-04-08 MED ORDER — MAGNESIUM HYDROXIDE 400 MG/5ML PO SUSP: 30.0000 mL | ORAL | Status: DC | PRN

## 2011-04-08 MED ORDER — FAMOTIDINE IN NACL 20-0.9 MG/50ML-% IV SOLN
20.0000 mg | Freq: Two times a day (BID) | INTRAVENOUS | Status: DC
  Administered 2011-04-08 – 2011-04-09 (×2): 20 mg via INTRAVENOUS
  Filled 2011-04-08 (×2): qty 50

## 2011-04-08 MED ORDER — FENTANYL CITRATE 0.05 MG/ML IJ SOLN
25.0000 ug | INTRAMUSCULAR | Status: DC | PRN
  Administered 2011-04-08 (×2): 50 ug via INTRAVENOUS

## 2011-04-08 MED ORDER — ROCURONIUM BROMIDE 100 MG/10ML IV SOLN
INTRAVENOUS | Status: DC | PRN
  Administered 2011-04-08: 50 mg via INTRAVENOUS

## 2011-04-08 MED ORDER — ONDANSETRON HCL 4 MG PO TABS: 4.0000 mg | ORAL_TABLET | Freq: Three times a day (TID) | ORAL | Status: DC | PRN

## 2011-04-08 MED ORDER — DOCUSATE SODIUM 100 MG PO CAPS
100.0000 mg | ORAL_CAPSULE | Freq: Every day | ORAL | Status: DC
  Administered 2011-04-09: 100 mg via ORAL
  Filled 2011-04-08: qty 1

## 2011-04-08 MED ORDER — FENOFIBRATE 54 MG PO TABS
54.0000 mg | ORAL_TABLET | Freq: Every day | ORAL | Status: DC
  Administered 2011-04-08 – 2011-04-09 (×2): 54 mg via ORAL
  Filled 2011-04-08 (×2): qty 1

## 2011-04-08 MED ORDER — LACTATED RINGERS IV SOLN
INTRAVENOUS | Status: DC | PRN
  Administered 2011-04-08 (×2): via INTRAVENOUS

## 2011-04-08 MED ORDER — MORPHINE SULFATE 2 MG/ML IJ SOLN: 2.0000 mg | INTRAMUSCULAR | Status: DC | PRN

## 2011-04-08 MED ORDER — OXYCODONE HCL 5 MG PO TABS
5.0000 mg | ORAL_TABLET | ORAL | Status: DC | PRN
  Administered 2011-04-08: 10 mg via ORAL
  Administered 2011-04-08: 5 mg via ORAL
  Administered 2011-04-09: 10 mg via ORAL
  Filled 2011-04-08: qty 2
  Filled 2011-04-08 (×2): qty 1
  Filled 2011-04-08: qty 2

## 2011-04-08 MED ORDER — LACTATED RINGERS IV SOLN: 1000.0000 mL | INTRAVENOUS | Status: DC

## 2011-04-08 MED ORDER — RAMIPRIL 10 MG PO CAPS
10.0000 mg | ORAL_CAPSULE | Freq: Two times a day (BID) | ORAL | Status: DC
  Administered 2011-04-08 – 2011-04-09 (×2): 10 mg via ORAL
  Filled 2011-04-08 (×3): qty 1

## 2011-04-08 MED ORDER — DOPAMINE-DEXTROSE 3.2-5 MG/ML-% IV SOLN: 3.0000 ug/kg/min | INTRAVENOUS | Status: DC | PRN

## 2011-04-08 MED ORDER — PHENOL 1.4 % MT LIQD: 1.0000 | OROMUCOSAL | Status: DC | PRN

## 2011-04-08 MED ORDER — GLIPIZIDE 10 MG PO TABS
10.0000 mg | ORAL_TABLET | Freq: Every day | ORAL | Status: DC
  Administered 2011-04-08 – 2011-04-09 (×2): 10 mg via ORAL
  Filled 2011-04-08 (×2): qty 1

## 2011-04-08 MED ORDER — ONDANSETRON HCL 4 MG/2ML IJ SOLN
INTRAMUSCULAR | Status: DC | PRN
  Administered 2011-04-08: 4 mg via INTRAVENOUS

## 2011-04-08 MED ORDER — PROMETHAZINE HCL 25 MG/ML IJ SOLN
6.2500 mg | INTRAMUSCULAR | Status: DC | PRN
  Administered 2011-04-08: 6.25 mg via INTRAVENOUS

## 2011-04-08 MED ORDER — SODIUM CHLORIDE 0.9 % IV SOLN: 500.0000 mL | Freq: Once | INTRAVENOUS | Status: AC | PRN

## 2011-04-08 MED ORDER — NEOSTIGMINE METHYLSULFATE 1 MG/ML IJ SOLN
INTRAMUSCULAR | Status: DC | PRN
  Administered 2011-04-08: 3 mg via INTRAVENOUS

## 2011-04-08 MED ORDER — MORPHINE SULFATE 2 MG/ML IJ SOLN: 0.0500 mg/kg | INTRAMUSCULAR | Status: DC | PRN

## 2011-04-08 MED ORDER — GLYCOPYRROLATE 0.2 MG/ML IJ SOLN
INTRAMUSCULAR | Status: DC | PRN
  Administered 2011-04-08: .6 mg via INTRAVENOUS

## 2011-04-08 MED ORDER — FENTANYL CITRATE 0.05 MG/ML IJ SOLN
INTRAMUSCULAR | Status: DC | PRN
  Administered 2011-04-08: 250 ug via INTRAVENOUS

## 2011-04-08 MED ORDER — AMLODIPINE BESYLATE 5 MG PO TABS
5.0000 mg | ORAL_TABLET | Freq: Every day | ORAL | Status: DC
  Administered 2011-04-09: 5 mg via ORAL
  Filled 2011-04-08 (×2): qty 1

## 2011-04-08 MED ORDER — CETIRIZINE HCL 10 MG PO TABS
5.0000 mg | ORAL_TABLET | Freq: Every day | ORAL | Status: DC
  Administered 2011-04-09: 10 mg via ORAL
  Filled 2011-04-08 (×3): qty 1

## 2011-04-08 MED ORDER — ACETAMINOPHEN 650 MG RE SUPP: 325.0000 mg | RECTAL | Status: DC | PRN

## 2011-04-08 MED ORDER — DM-GUAIFENESIN ER 30-600 MG PO TB12
1.0000 | ORAL_TABLET | Freq: Two times a day (BID) | ORAL | Status: DC
  Administered 2011-04-08 – 2011-04-09 (×2): 1 via ORAL
  Filled 2011-04-08 (×3): qty 1

## 2011-04-08 MED ORDER — HEPARIN SODIUM (PORCINE) 1000 UNIT/ML IJ SOLN
INTRAMUSCULAR | Status: DC | PRN
  Administered 2011-04-08: 10000 [IU] via INTRAVENOUS

## 2011-04-08 MED ORDER — METOPROLOL TARTRATE 1 MG/ML IV SOLN: 2.0000 mg | INTRAVENOUS | Status: DC | PRN

## 2011-04-08 MED ORDER — INSULIN ASPART 100 UNIT/ML ~~LOC~~ SOLN
0.0000 [IU] | Freq: Three times a day (TID) | SUBCUTANEOUS | Status: DC
  Filled 2011-04-08: qty 3

## 2011-04-08 MED ORDER — PIOGLITAZONE HCL 30 MG PO TABS
30.0000 mg | ORAL_TABLET | Freq: Every day | ORAL | Status: DC
Start: 2011-04-08 — End: 2011-04-09
  Administered 2011-04-08 – 2011-04-09 (×2): 30 mg via ORAL
  Filled 2011-04-08 (×2): qty 1

## 2011-04-08 MED ORDER — LABETALOL HCL 5 MG/ML IV SOLN: 10.0000 mg | INTRAVENOUS | Status: DC | PRN

## 2011-04-08 MED ORDER — SIMVASTATIN 40 MG PO TABS
40.0000 mg | ORAL_TABLET | Freq: Every day | ORAL | Status: DC
  Administered 2011-04-08 – 2011-04-09 (×2): 40 mg via ORAL
  Filled 2011-04-08 (×2): qty 1

## 2011-04-08 MED ORDER — NITROGLYCERIN IN D5W 200-5 MCG/ML-% IV SOLN: 5.0000 ug/min | INTRAVENOUS | Status: DC | PRN

## 2011-04-08 MED ORDER — TEMAZEPAM 15 MG PO CAPS: 15.0000 mg | ORAL_CAPSULE | Freq: Every evening | ORAL | Status: DC | PRN

## 2011-04-08 MED ORDER — MIDAZOLAM HCL 5 MG/5ML IJ SOLN
INTRAMUSCULAR | Status: DC | PRN
  Administered 2011-04-08: 1 mg via INTRAVENOUS

## 2011-04-08 MED ORDER — NITROGLYCERIN IN D5W 200-5 MCG/ML-% IV SOLN
INTRAVENOUS | Status: DC | PRN
  Administered 2011-04-08: 5 ug/min via INTRAVENOUS

## 2011-04-08 MED ORDER — SODIUM CHLORIDE 0.9 % IR SOLN
Status: DC | PRN
  Administered 2011-04-08: 2000 mL

## 2011-04-08 MED ORDER — LOPERAMIDE HCL 1 MG/5ML PO LIQD
1.0000 mg | Freq: Four times a day (QID) | ORAL | Status: DC | PRN
  Filled 2011-04-08: qty 5

## 2011-04-08 MED ORDER — PSEUDOEPHEDRINE-GUAIFENESIN ER 60-600 MG PO TB12: 1.0000 | ORAL_TABLET | Freq: Two times a day (BID) | ORAL | Status: DC

## 2011-04-08 MED ORDER — DOXYCYCLINE HYCLATE 100 MG PO TABS
100.0000 mg | ORAL_TABLET | Freq: Every day | ORAL | Status: DC
  Administered 2011-04-08 – 2011-04-09 (×2): 100 mg via ORAL
  Filled 2011-04-08 (×2): qty 1

## 2011-04-08 MED ORDER — ASPIRIN 325 MG PO TABS
325.0000 mg | ORAL_TABLET | Freq: Every day | ORAL | Status: DC
  Administered 2011-04-09: 325 mg via ORAL
  Filled 2011-04-08: qty 1

## 2011-04-08 MED ORDER — ONDANSETRON HCL 4 MG/2ML IJ SOLN: 4.0000 mg | Freq: Four times a day (QID) | INTRAMUSCULAR | Status: DC | PRN

## 2011-04-08 MED ORDER — PROTAMINE SULFATE 10 MG/ML IV SOLN
INTRAVENOUS | Status: DC | PRN
  Administered 2011-04-08: 100 mg via INTRAVENOUS

## 2011-04-08 MED ORDER — PROPOFOL 10 MG/ML IV EMUL
INTRAVENOUS | Status: DC | PRN
  Administered 2011-04-08: 200 mg via INTRAVENOUS

## 2011-04-08 MED ORDER — GUAIFENESIN-DM 100-10 MG/5ML PO SYRP: 15.0000 mL | ORAL_SOLUTION | ORAL | Status: DC | PRN

## 2011-04-08 MED ORDER — PHENYLEPHRINE HCL 10 MG/ML IJ SOLN
INTRAMUSCULAR | Status: DC | PRN
  Administered 2011-04-08 (×4): 40 ug via INTRAVENOUS
  Administered 2011-04-08: 120 ug via INTRAVENOUS
  Administered 2011-04-08 (×2): 40 ug via INTRAVENOUS
  Administered 2011-04-08: 80 ug via INTRAVENOUS

## 2011-04-08 MED ORDER — OXYCODONE-ACETAMINOPHEN 5-325 MG PO TABS: 1.0000 | ORAL_TABLET | Freq: Four times a day (QID) | ORAL | Status: DC | PRN

## 2011-04-08 MED ORDER — NITROGLYCERIN 0.4 MG SL SUBL: 0.4000 mg | SUBLINGUAL_TABLET | SUBLINGUAL | Status: DC | PRN

## 2011-04-08 SURGICAL SUPPLY — 57 items
ADH SKN CLS APL DERMABOND .7 (GAUZE/BANDAGES/DRESSINGS) ×1
ADH SKN CLS LQ APL DERMABOND (GAUZE/BANDAGES/DRESSINGS) ×1
CANISTER SUCTION 2500CC (MISCELLANEOUS) ×2 IMPLANT
CANNULA VESSEL W/WING WO/VALVE (CANNULA) ×2 IMPLANT
CATH ROBINSON RED A/P 18FR (CATHETERS) ×2 IMPLANT
CLIP TI MEDIUM 24 (CLIP) ×2 IMPLANT
CLIP TI WIDE RED SMALL 24 (CLIP) ×2 IMPLANT
CLOTH BEACON ORANGE TIMEOUT ST (SAFETY) ×2 IMPLANT
COVER SURGICAL LIGHT HANDLE (MISCELLANEOUS) ×4 IMPLANT
CRADLE DONUT ADULT HEAD (MISCELLANEOUS) ×2 IMPLANT
DECANTER SPIKE VIAL GLASS SM (MISCELLANEOUS) IMPLANT
DERMABOND ADHESIVE PROPEN (GAUZE/BANDAGES/DRESSINGS) ×1
DERMABOND ADVANCED (GAUZE/BANDAGES/DRESSINGS) ×1
DERMABOND ADVANCED .7 DNX12 (GAUZE/BANDAGES/DRESSINGS) ×1 IMPLANT
DERMABOND ADVANCED .7 DNX6 (GAUZE/BANDAGES/DRESSINGS) IMPLANT
DRAIN HEMOVAC 1/8 X 5 (WOUND CARE) IMPLANT
DRAPE WARM FLUID 44X44 (DRAPE) ×2 IMPLANT
DRSG COVADERM 4X8 (GAUZE/BANDAGES/DRESSINGS) ×1 IMPLANT
ELECT REM PT RETURN 9FT ADLT (ELECTROSURGICAL) ×2
ELECTRODE REM PT RTRN 9FT ADLT (ELECTROSURGICAL) ×1 IMPLANT
EVACUATOR SILICONE 100CC (DRAIN) IMPLANT
GEL ULTRASOUND 20GR AQUASONIC (MISCELLANEOUS) IMPLANT
GLOVE BIO SURGEON STRL SZ 6.5 (GLOVE) ×1 IMPLANT
GLOVE BIO SURGEON STRL SZ7.5 (GLOVE) ×2 IMPLANT
GLOVE BIOGEL PI IND STRL 6.5 (GLOVE) IMPLANT
GLOVE BIOGEL PI IND STRL 7.0 (GLOVE) IMPLANT
GLOVE BIOGEL PI INDICATOR 6.5 (GLOVE) ×3
GLOVE BIOGEL PI INDICATOR 7.0 (GLOVE) ×2
GLOVE SS BIOGEL STRL SZ 6.5 (GLOVE) IMPLANT
GLOVE SUPERSENSE BIOGEL SZ 6.5 (GLOVE) ×1
GLOVE SURG SS PI 6.5 STRL IVOR (GLOVE) ×1 IMPLANT
GOWN PREVENTION PLUS XLARGE (GOWN DISPOSABLE) ×2 IMPLANT
GOWN STRL NON-REIN LRG LVL3 (GOWN DISPOSABLE) ×4 IMPLANT
KIT BASIN OR (CUSTOM PROCEDURE TRAY) ×2 IMPLANT
KIT ROOM TURNOVER OR (KITS) ×2 IMPLANT
LOOP VESSEL MINI RED (MISCELLANEOUS) IMPLANT
NDL HYPO 25GX1X1/2 BEV (NEEDLE) IMPLANT
NEEDLE HYPO 25GX1X1/2 BEV (NEEDLE) IMPLANT
NS IRRIG 1000ML POUR BTL (IV SOLUTION) ×4 IMPLANT
PACK CAROTID (CUSTOM PROCEDURE TRAY) ×2 IMPLANT
PAD ARMBOARD 7.5X6 YLW CONV (MISCELLANEOUS) ×2 IMPLANT
PATCH HEMASHIELD 8X75 (Vascular Products) ×1 IMPLANT
SHUNT CAROTID BYPASS 10 (VASCULAR PRODUCTS) ×1 IMPLANT
SHUNT CAROTID BYPASS 12FRX15.5 (VASCULAR PRODUCTS) IMPLANT
SPECIMEN JAR SMALL (MISCELLANEOUS) ×2 IMPLANT
SPONGE SURGIFOAM ABS GEL 100 (HEMOSTASIS) IMPLANT
SUT ETHILON 3 0 PS 1 (SUTURE) IMPLANT
SUT PROLENE 6 0 CC (SUTURE) ×2 IMPLANT
SUT PROLENE 7 0 BV 1 (SUTURE) ×1 IMPLANT
SUT VIC AB 3-0 SH 27 (SUTURE) ×2
SUT VIC AB 3-0 SH 27X BRD (SUTURE) ×1 IMPLANT
SUT VICRYL 4-0 PS2 18IN ABS (SUTURE) ×2 IMPLANT
SYR CONTROL 10ML LL (SYRINGE) IMPLANT
TOWEL OR 17X24 6PK STRL BLUE (TOWEL DISPOSABLE) ×2 IMPLANT
TOWEL OR 17X26 10 PK STRL BLUE (TOWEL DISPOSABLE) ×2 IMPLANT
TRAY FOLEY CATH 14FRSI W/METER (CATHETERS) ×2 IMPLANT
WATER STERILE IRR 1000ML POUR (IV SOLUTION) ×2 IMPLANT

## 2011-04-08 NOTE — Transfer of Care (Signed)
Immediate Anesthesia Transfer of Care Note  Patient: [name]  Procedure(s) Performed:  ENDARTERECTOMY CAROTID - with patch angioplasty  Patient Location: PACU  Anesthesia Type: General  Level of Consciousness: awake, alert  and oriented  Airway & Oxygen Therapy: Patient Spontanous Breathing and Patient connected to nasal cannula oxygen  Post-op Assessment: Report given to PACU RN  Post vital signs: stable  Complications: No apparent anesthesia complications

## 2011-04-08 NOTE — Op Note (Signed)
Procedure Left carotid endarterectomy  Preoperative diagnosis: High-grade asymptomatic left internal carotid artery stenosis  Postoperative diagnosis: Same  Anesthesia General  Asst.: Evorn Gong, PAC, Waldron Labs, RNFA  Operative findings: #1 greater than 90% left internal carotid artery stenosis with calcified plaque  #2 10 French shunt  #3 Dacron patch  Operative details: After obtaining informed consent, the patient was taken to the operating room. The patient was placed in a supine position on the operating room table. After induction of general anesthesia and endotracheal intubation a Foley catheter was placed. Next the patient's entire neck and chest was prepped and draped in usual sterile fashion. An oblique incision was made on the left aspect of the patient's neck anterior to the border the left sternocleidomastoid muscle. The incision was carried into the subcutaneous tissues and through the platysma. Sternocleidomastoid muscle was identified and reflected laterally. The omohyoid muscle was identified this was divided with cautery. Common carotid artery was then found at the base of the incision this was dissected free circumferentially. The vagus nerve was identified and protected. Dissection was then carried up to the level carotid bifurcation. The Ansa cervicalis was identified and traced up to its insertion into the hypoglossal nerve.  The ansa was divided at this level to assist with exposure.  The hyperglossal nerve was identified and protected. The internal carotid artery was dissected free circumferentially just below the level of the hypoglossal nerve and it was soft in character at this location and above any palpable disease. A vessel loop was placed around this. Next the external carotid and superior thyroid arteries were dissected free circumferentially and vessel loops were placed around these. The patient was given 10000 units of intravenous heparin. After 2 minutes  of circulation time and raising the mean arterial pressure to 85 mm mercury, the distal internal carotid artery was controlled with small bulldog clamp. The external carotid and superior thyroid arteries were controlled with vessel loops. The common carotid artery was controlled peripheral DeBakey clamp. A longitudinal opening was made in the common carotid artery just below the bifurcation. The arteriotomy was extended distally up in the internal carotid with Potts scissors. There was a large calcified plaque with greater than 90% stenosis at the carotid bifurcation. The arteriotomy was extended passed the level of stenosis. Next a 44 [place] shunt was brought in the operative field was threaded in the distal internal carotid artery and allowed to backbleed thoroughly. There was brisk backbleeding from the internal carotid artery around the shunt requiring a distal Javid shunt clamp.  This shunt was then threaded into the common carotid artery and secured with a Rummel tourniquet. Shunt was inspected and found to be free of air. Flow was then restored to the brain after approximately 6 minutes of ischemia time.  There was also some bleeding around the proximal shunt so this was also controlled with a Javid shunt clamp. Attention was then turned to the common carotid artery once again. A suitable endarterectomy plane was obtained and endarterectomy was begun in the common carotid artery and a good proximal endpoint was obtained. An eversion endarterectomy was performed on the external carotid artery and a good endpoint was obtained. The plaque was then elevated in the internal carotid artery and a nice feathered distal endpoint was also obtained. The plaque was passed off the table as a specimen. All loose debris was then removed from the carotid bed and everything was thoroughly irrigated with heparinized saline. The Dacron patch was then brought out on  to the operative field this was sewn on as a patch angioplasty  using running 6-0 Prolene suture. Prior to completion of the anastomosis the shunt was reoccluded and brought down out of the distal internal carotid artery. The internal carotid artery was thoroughly backbled. This was then controlled again with small bulldog clamp. The shunt was then removed from the proximal common carotid artery and this is again secured with a peripheral DeBakey clamp after forward bleeding. The external carotid was also thoroughly backbled.  The remainder of the patch was completed and the anastomosis was secured. Flow was then restored first retrograde from the external carotid into the carotid bed then antegrade from the common carotid to the external carotid artery and after approximately 5 cardiac cycles to the internal carotid artery. Doppler was used to evaluate the external/internal and common carotid arteries and these all had good Doppler flow. Hemostasis was obtained with an additional repair stitch on the medial wall of the artery. The heparin was reversed with 100 mg Protamine.   Hemostasis was obtained.  The platysma muscle was reapproximated using a running 3-0 Vicryl suture. The skin was closed with 4 Vicryl subcuticular stitch.  The patient tolerated the procedure well except for blood pressure lability.  The patient was awakened in the operating room and was following commands and moving upper extremities symmetrically prior to transfer to the PACU.   Ruta Hinds, MD [facility] [place] Office: [phone] Pager: [phone]

## 2011-04-08 NOTE — OR Nursing (Signed)
Patient Displayed equal strength in bilateral hands and feet, tongue midline; pre-op and post-op.

## 2011-04-08 NOTE — Progress Notes (Signed)
Pt.'s family updated on status and room assignment.

## 2011-04-08 NOTE — Preoperative (Signed)
Beta Blockers   Reason not to administer Beta Blockers:Not Applicable 

## 2011-04-08 NOTE — Progress Notes (Signed)
  The patient has been re-examined.  The patient's history and physical has been reviewed and is unchanged from 03/26/11.  There is no change in the plan of care.  Ruta Hinds, MD

## 2011-04-08 NOTE — Anesthesia Postprocedure Evaluation (Signed)
  Anesthesia Post-op Note  Patient: [name]  Procedure(s) Performed:  ENDARTERECTOMY CAROTID - with patch angioplasty  Patient Location: PACU  Anesthesia Type: General  Level of Consciousness: awake and alert   Airway and Oxygen Therapy: Patient Spontanous Breathing and Patient connected to nasal cannula oxygen  Post-op Pain: none  Post-op Assessment: Post-op Vital signs reviewed, Patient's Cardiovascular Status Stable, Respiratory Function Stable, Patent Airway, No signs of Nausea or vomiting and Pain level controlled  Post-op Vital Signs: Reviewed and stable  Complications: No apparent anesthesia complications

## 2011-04-08 NOTE — Anesthesia Preprocedure Evaluation (Addendum)
Anesthesia Evaluation  Patient identified by MRN, date of birth, ID band Patient awake    Reviewed: Allergy & Precautions, H&P , NPO status , Patient's Chart, lab work & pertinent test results, reviewed documented beta blocker date and time   Airway Mallampati: II TM Distance: >3 FB Neck ROM: Full    Dental  (+) Teeth Intact and Dental Advisory Given   Pulmonary COPDformer smoker (Quit 2007) clear to auscultation  Pulmonary exam normal       Cardiovascular hypertension (Took nadolol today), Pt. on medications and Pt. on home beta blockers + angina (cannot remember last chest pain) with exertion + CAD ('03 Cath:  total RCA occlusion, 60% LAD, EF 50%, 8/12 stress test:  mod  inferior scar with mild peri-infarct ischemia, EF 59%  cannot remember last chest pain) and + Peripheral Vascular Disease (s/p Aorto-bifem, has carotid stenosis) Regular Normal '03 Cath:  Total RCA occlusion, 60% LAD, EF 50%   Neuro/Psych    GI/Hepatic GERD-  Poorly Controlled,  Endo/Other  Diabetes mellitus- (glu this am 54), Well Controlled, Type 2Morbid obesity  Renal/GU      Musculoskeletal   Abdominal (+) obese,   Peds  Hematology   Anesthesia Other Findings   Reproductive/Obstetrics                        Anesthesia Physical Anesthesia Plan  ASA: III  Anesthesia Plan: General   Post-op Pain Management:    Induction: Intravenous  Airway Management Planned: Oral ETT  Additional Equipment: Arterial line  Intra-op Plan:   Post-operative Plan: Extubation in OR  Informed Consent: I have reviewed the patients History and Physical, chart, labs and discussed the procedure including the risks, benefits and alternatives for the proposed anesthesia with the patient or authorized representative who has indicated his/her understanding and acceptance.   Dental advisory given  Plan Discussed with: CRNA and Surgeon  Anesthesia  Plan Comments: (Plan routine monitors, A line, GETA)        Anesthesia Quick Evaluation

## 2011-04-08 NOTE — Plan of Care (Signed)
Problem: Consults Goal: Diagnosis CEA/CES/AAA Stent Outcome: Completed/Met Date Met:  04/08/11 Carotid Endarterectomy (CEA)

## 2011-04-08 NOTE — Progress Notes (Signed)
BLOOD SUGAR WAS  93

## 2011-04-08 NOTE — Anesthesia Procedure Notes (Signed)
Procedure Name: Intubation Date/Time: 04/08/2011 10:43 AM Performed by: Barrington Ellison Pre-anesthesia Checklist: Patient identified, Emergency Drugs available, Suction available, Patient being monitored and Timeout performed Patient Re-evaluated:Patient Re-evaluated prior to inductionOxygen Delivery Method: Circle System Utilized Preoxygenation: Pre-oxygenation with 100% oxygen Intubation Type: IV induction Ventilation: Two handed mask ventilation required and Oral airway inserted - appropriate to patient size Laryngoscope Size: Mac and 4 Grade View: Grade I Tube type: Oral Tube size: 7.5 mm Number of attempts: 1 Airway Equipment and Method: stylet Placement Confirmation: ETT inserted through vocal cords under direct vision,  positive ETCO2,  CO2 detector and breath sounds checked- equal and bilateral Secured at: 23 cm Tube secured with: Tape Dental Injury: Teeth and Oropharynx as per pre-operative assessment

## 2011-04-09 LAB — BASIC METABOLIC PANEL
BUN: 28 mg/dL — ABNORMAL HIGH (ref 6–23)
Calcium: 8 mg/dL — ABNORMAL LOW (ref 8.4–10.5)
Creatinine, Ser: 1.09 mg/dL (ref 0.50–1.35)
GFR calc Af Amer: 77 mL/min — ABNORMAL LOW (ref >90)
GFR calc non Af Amer: 66 mL/min — ABNORMAL LOW (ref >90)
Potassium: 5.1 mEq/L (ref 3.5–5.1)

## 2011-04-09 LAB — CBC
HCT: 32.4 % — ABNORMAL LOW (ref 39.0–52.0)
MCHC: 32.4 g/dL (ref 30.0–36.0)
RDW: 15.3 % (ref 11.5–15.5)

## 2011-04-09 LAB — GLUCOSE, CAPILLARY

## 2011-04-09 MED ORDER — SODIUM CHLORIDE 0.9 % IV SOLN
INTRAVENOUS | Status: DC
  Administered 2011-04-09: 08:00:00 via INTRAVENOUS

## 2011-04-09 NOTE — Discharge Summary (Signed)
[facility] Discharge Summary   Patient ID:  [name] MRN: [id number] DOB/AGE: [birth year] 70 y.o.  Admit date: 04/08/2011 Discharge date: 04/09/2011 Date of Surgery: 04/08/2011 Surgeon: Surgeon(s): Elam Dutch, MD  Admission Diagnosis: LEFT ICA STENOSIS  Discharge Diagnoses:  LEFT ICA STENOSIS  Secondary Diagnoses: Past Medical History  Diagnosis Date  . Diabetes mellitus   . Chest pain   . Muscle pain   . Diarrhea   . Hypertension   . Hyperlipidemia   . Hyperlipidemia   . Atherosclerotic heart disease   . PVD (peripheral vascular disease)   . Peripheral neuropathy   . Seasonal allergies   . Nasal polyps     hx of  . Fall     3wks ago and has a bruise under left eye  . Edema     right leg knee down swollen since fall 3 wks ago  . Back pain   . GERD (gastroesophageal reflux disease)     Rolaids as needed-occ reflux  . Colon polyps   . Renal insufficiency   . Urinary frequency     urgency/takes Vesicare daily  . Kidney stone     35+yrs ago  . Arthritis   . Pneumonia     as a child    Procedures: Procedure(s): ENDARTERECTOMY CAROTID _ Left  Discharged Condition: good  HPI:  [facility] [place]  HISTORY AND PHYSICAL  History of Present Illness: Patient is a 70 y.o. year old male who presents for follow-up evaluation for carotid stenosis. He is on Aspirin for antiplatelet therapy. His atherosclerotic risk factors remain diabetes, elevated cholesterol, and hypertension.. These are all currently stable and followed by his primary care physician. He denies any new neurologic events including amaurosis, numbness, or weakness. He has had no further falls since his last office visit. He returns today after a CT angiogram of the neck.  CT scan of the head dated 03/09/2011 as well as CT angiogram of the neck dated 03/26/2011 were reviewed today. The CT scan of the head shows no evidence of acute infarct. He CT  angiogram shows a 50% stenosis of the proximal right common carotid artery with a high-grade stenosis at the carotid bifurcation which is heavily calcified. The left carotid bifurcation is also heavily calcified. There does not appear to be significant proximal left common carotid stenosis.  ASSESSMENT: Bilateral calcified high grade internal carotid artery stenosis. He also has some component of common carotid stenosis proximally on the right side. We will proceed with a left carotid endarterectomy on this admission   Hospital Course:  [name] is a 70 y.o. male is S/P Left ENDARTERECTOMY CAROTID Extubated: POD # 0 Post-op wounds healing well Pt. Ambulating, voiding and taking PO diet without difficulty. Pt denies dificulty swallowing Pt pain controlled with PO pain meds. Labs as below Complications:none  Consults:     Significant Diagnostic Studies: CBC    Component Value Date/Time   WBC 6.2 04/09/2011 0500   RBC 3.38* 04/09/2011 0500   HGB 10.5* 04/09/2011 0500   HCT 32.4* 04/09/2011 0500   PLT 192 04/09/2011 0500   MCV 95.9 04/09/2011 0500   MCH 31.1 04/09/2011 0500   MCHC 32.4 04/09/2011 0500   RDW 15.3 04/09/2011 0500   LYMPHSABS 1.0 03/09/2011 1418   MONOABS 0.5 03/09/2011 1418   EOSABS 0.2 03/09/2011 1418   BASOSABS 0.0 03/09/2011 1418    BMET    Component Value Date/Time   NA  135 04/09/2011 0500   K 5.1 04/09/2011 0500   CL 106 04/09/2011 0500   CO2 20 04/09/2011 0500   GLUCOSE 91 04/09/2011 0500   BUN 28* 04/09/2011 0500   CREATININE 1.09 04/09/2011 0500   CALCIUM 8.0* 04/09/2011 0500   GFRNONAA 66* 04/09/2011 0500   GFRAA 77* 04/09/2011 0500    COAG Lab Results  Component Value Date   INR 1.06 04/03/2011     Disposition:  Discharge to :Home Discharge Orders    Future Orders Please Complete By Expires   Resume previous diet      Driving Restrictions      Comments:   No driving for 2 weeks   Lifting restrictions      Comments:   No  lifting for 4 weeks   Call MD for:  temperature >100.5      Call MD for:  redness, tenderness, or signs of infection (pain, swelling, bleeding, redness, odor or green/yellow discharge around incision site)      Call MD for:  severe or increased pain, loss or decreased feeling  in affected limb(s)      Increase activity slowly      Comments:   Walk with assistance use walker or cane as needed   May shower       Scheduling Instructions:   Saturday   may wash over wound with mild soap and water      No dressing needed      CAROTID Sugery: Call MD for difficulty swallowing or speaking; weakness in arms or legs that is a new symtom; severe headache.  If you have increased swelling in the neck and/or  are having difficulty breathing, CALL 911         Jonn, Milia  Home Medication Instructions [id number]   Printed on:04/09/11 [id number]  Medication Information                    aspirin 325 MG tablet Take 325 mg by mouth daily.            fenofibrate (TRICOR) 145 MG tablet Take 145 mg by mouth daily.            metFORMIN (GLUCOPHAGE) 500 MG tablet Take 1,000 mg by mouth 2 (two) times daily with a meal.            glipiZIDE (GLUCOTROL) 10 MG tablet Take 10 mg by mouth daily.            pioglitazone (ACTOS) 30 MG tablet Take 30 mg by mouth daily.            cilostazol (PLETAL) 100 MG tablet Take 100 mg by mouth 2 (two) times daily.            nadolol (CORGARD) 80 MG tablet Take 80 mg by mouth daily.            ramipril (ALTACE) 10 MG tablet Take 10 mg by mouth 2 (two) times daily.            Cetirizine HCl (ZYRTEC ALLERGY PO) Take 1 tablet by mouth daily as needed. For allergies           gabapentin (NEURONTIN) 300 MG capsule Take 300 mg by mouth 2 (two) times daily.            doxycycline (VIBRA-TABS) 100 MG tablet Take 100 mg by mouth daily.            amLODipine-atorvastatin (CADUET) 5-20 MG per tablet  Take 1 tablet by mouth daily.            VESICARE 5 MG  tablet Take 5 mg by mouth Daily.           oxyCODONE-acetaminophen (PERCOCET) 5-325 MG per tablet Take 1 tablet by mouth Every 8 hours as needed. For pain           ondansetron (ZOFRAN) 4 MG tablet Take 1 tablet by mouth Every 8 hours as needed. For nausea           NAPRELAN 500 MG 24 hr tablet Take 500 mg by mouth Twice daily as needed. For pain           nitroGLYCERIN (NITROSTAT) 0.4 MG SL tablet Place 0.4 mg under the tongue every 5 (five) minutes as needed. For chest pain            b complex vitamins capsule Take 1 capsule by mouth daily.             pseudoephedrine-guaifenesin (MUCINEX D) 60-600 MG per tablet Take 1 tablet by mouth every 12 (twelve) hours.             temazepam (RESTORIL) 15 MG capsule Take 15 mg by mouth at bedtime as needed.            loperamide (IMODIUM) 1 MG/5ML solution Take by mouth 4 (four) times daily as needed.              Verbal and written Discharge instructions given to the patient. Wound care per Discharge AVS  Signed: Richrd Prime [facility] 04/09/2011, 7:34 AM

## 2011-04-09 NOTE — Progress Notes (Signed)
[facility] Progress Note  04/09/2011 7:12 AM POD 1  OOB in chair.  "Anticipating pain and therefore took 2 pain pills".  Not trouble swallowing liquids, but has a lot of mucous.  Filed Vitals:   04/09/11 0410  BP: 122/37  Pulse: 48  Temp: 98.4 F (36.9 C)  Resp: 22    Incisions:  No hematoma, but dried blood along the incision.  No drainage Neuro:  In tact; no deficits.  CBC    Component Value Date/Time   WBC 6.2 04/09/2011 0500   RBC 3.38* 04/09/2011 0500   HGB 10.5* 04/09/2011 0500   HCT 32.4* 04/09/2011 0500   PLT 192 04/09/2011 0500   MCV 95.9 04/09/2011 0500   MCH 31.1 04/09/2011 0500   MCHC 32.4 04/09/2011 0500   RDW 15.3 04/09/2011 0500   LYMPHSABS 1.0 03/09/2011 1418   MONOABS 0.5 03/09/2011 1418   EOSABS 0.2 03/09/2011 1418   BASOSABS 0.0 03/09/2011 1418    BMET    Component Value Date/Time   NA 135 04/09/2011 0500   K 5.1 04/09/2011 0500   CL 106 04/09/2011 0500   CO2 20 04/09/2011 0500   GLUCOSE 91 04/09/2011 0500   BUN 28* 04/09/2011 0500   CREATININE 1.09 04/09/2011 0500   CALCIUM 8.0* 04/09/2011 0500   GFRNONAA 66* 04/09/2011 0500   GFRAA 77* 04/09/2011 0500    INR    Component Value Date/Time   INR 1.06 04/03/2011 1325     Intake/Output Summary (Last 24 hours) at 04/09/11 [id number] Last data filed at 04/09/11 0600  Gross per 24 hour  Intake   2935 ml  Output   1505 ml  Net   1430 ml     Assessment/Plan:  70 y.o. male is s/p Left CEA POD 1  Doing well.  Foley at at 0600 this am.  Needs to void and walk. K+ 5.1 this am-d/c potassium in IVF Possible d/c home later today if he voids and tolerates a diet this am. Acute surgical blood loss anemia-tolerating.   Evorn Gong, PA-C [facility] [phone] 04/09/2011 7:12 AM

## 2011-04-09 NOTE — Progress Notes (Signed)
  [facility] [place]  Subjective  - POD #1 left CEA, a little lethargic, otherwise doing well  Objective 131/50 50 98.8 F (37.1 C) (Oral) 18 95%  Intake/Output Summary (Last 24 hours) at 04/09/11 0840 Last data filed at 04/09/11 0600  Gross per 24 hour  Intake   2935 ml  Output   1505 ml  Net   1430 ml   Left neck incision clean, mild edema, no hematoma Neuro intact UE LE 5/5 tongue midline  Assessment/Planning: Doing well post CEA D/C home when voids Follow-up 2-3 weeks to consider right CEA ASA qd  FIELDS,CHARLES E 04/09/2011 8:40 AM --  Laboratory Lab Results:  Basename 04/09/11 0500  WBC 6.2  HGB 10.5*  HCT 32.4*  PLT 192   BMET  Basename 04/09/11 0500  NA 135  K 5.1  CL 106  CO2 20  GLUCOSE 91  BUN 28*  CREATININE 1.09  CALCIUM 8.0*    COAG Lab Results  Component Value Date   INR 1.06 04/03/2011   No results found for this basename: PTT    Antibiotics Anti-infectives     Start     Dose/Rate Route Frequency Ordered Stop   04/08/11 2200   cefUROXime (ZINACEF) 1.5 g in dextrose 5 % 50 mL IVPB        1.5 g 100 mL/hr over 30 Minutes Intravenous Every 12 hours 04/08/11 1700 04/09/11 2159   04/08/11 1800   doxycycline (VIBRA-TABS) tablet 100 mg        100 mg Oral Daily 04/08/11 1659     04/08/11 0000   vancomycin (VANCOCIN) 1,500 mg in sodium chloride 0.9 % 500 mL IVPB     Comments: For weight > or equal to   185lbs give 1500mg ; if 120-185 lbs given 1250mg ;  If less than 120 lbs give 1000mg  iv to or      1,500 mg 250 mL/hr over 120 Minutes Intravenous  Once 04/07/11 1533 04/08/11 1027

## 2011-04-09 NOTE — Discharge Summary (Signed)
Ruta Hinds, MD [facility] [place] Office: [phone] Pager: [phone]

## 2011-04-10 ENCOUNTER — Encounter: Payer: Self-pay | Admitting: Vascular Surgery

## 2011-04-10 ENCOUNTER — Ambulatory Visit (INDEPENDENT_AMBULATORY_CARE_PROVIDER_SITE_OTHER): Payer: Medicare Other | Admitting: Vascular Surgery

## 2011-04-10 VITALS — BP 138/45 | HR 58 | Temp 98.6°F | Ht 71.0 in | Wt 314.0 lb

## 2011-04-10 DIAGNOSIS — I6529 Occlusion and stenosis of unspecified carotid artery: Secondary | ICD-10-CM

## 2011-04-10 DIAGNOSIS — Z9889 Other specified postprocedural states: Secondary | ICD-10-CM

## 2011-04-10 NOTE — Progress Notes (Signed)
[facility] [place]  Postoperative Visit  History of Present Illness  [name] is a 70 y.o. male who presents for postoperative follow-up for: L CEA with Dr. Oneida Alar (Date: 04/08/11).  The patient's neck incision is healing.  The patient has had no stroke or TIA symptoms.  The pt is complaining of URI sx and increased neck swelling and left jawline numbness.  Physical Examination  Filed Vitals:   04/10/11 1434  BP: 138/45  Pulse: 58  Temp:     L Neck: Incision is c/d/i, the left neck is echymotic with a limited amt of swelling, no frankly ballotable fluid collection, no frank LAD Oropharynx: erythema without exudate Pulm: no stridor, intermittent rales, no rhonchi Neuro: CN 2-12 are intact , Motor strength is 5/5 bilaterally, sensation is grossly intact  Medical Decision Making  [name] is a 70 y.o. male who presents s/p L CEA and likely URI  The patient's neck incision is healing with no stroke symptoms. The patient's neck swelling is appropriate and not of significant concern for airway compromise I recommended use of mucolytic like guafenisin to help with sx The patient already has follow up scheduled with Dr. Oneida Alar and will follow up at that time  Thank you for allowing [place] to participate in this patient's care.  Adele Barthel, MD [facility] [place] Office: [phone] Pager: [phone]

## 2011-04-14 ENCOUNTER — Encounter (HOSPITAL_COMMUNITY): Payer: Self-pay | Admitting: Vascular Surgery

## 2011-04-29 ENCOUNTER — Encounter: Payer: Self-pay | Admitting: Vascular Surgery

## 2011-04-30 ENCOUNTER — Ambulatory Visit (INDEPENDENT_AMBULATORY_CARE_PROVIDER_SITE_OTHER): Payer: Medicare Other | Admitting: Vascular Surgery

## 2011-04-30 ENCOUNTER — Encounter: Payer: Self-pay | Admitting: Vascular Surgery

## 2011-04-30 VITALS — BP 179/72 | HR 49 | Temp 97.8°F | Ht 71.0 in | Wt 290.0 lb

## 2011-04-30 DIAGNOSIS — Z9889 Other specified postprocedural states: Secondary | ICD-10-CM

## 2011-04-30 DIAGNOSIS — I739 Peripheral vascular disease, unspecified: Secondary | ICD-10-CM

## 2011-04-30 DIAGNOSIS — I6529 Occlusion and stenosis of unspecified carotid artery: Secondary | ICD-10-CM

## 2011-04-30 NOTE — Progress Notes (Signed)
Mr. Rasler returns today for followup after his recent left carotid endarterectomy in late November. He denies any symptoms of TIA amaurosis or stroke. He has a known high-grade carotid stenosis on the right side as well as possible common carotid stenosis. He remains asymptomatic. Several questions regarding his operation were answered today.  Blood pressure 179/72, pulse 49, temperature 97.8 F (36.6 C), temperature source Oral, height 5\' 11"  (1.803 m), weight 290 lb (131.543 kg). Neck: Healing left neck incision with no surrounding erythema or drainage. No carotid bruits bilaterally.  Neuro: Upper extremity and lower extremity motor strength is 5/5 cranial nerves 2-12 intact  Assessment: Recovering well from previous left carotid endarterectomy  Plan: He will have a carotid angiogram to further evaluate his right common carotid stenosis as well as his bifurcation stenosis and we have scheduled this for Friday, December 28. We will tentatively do his carotid endarterectomy on the following Wednesday. He may need to have a note for a judge to have a continuance of a pending trial for which he is involved. Risks benefits possible complications and procedure details of the carotid angiogram were explained the patient today he understands and agrees to proceed.

## 2011-05-15 ENCOUNTER — Other Ambulatory Visit: Payer: Self-pay

## 2011-05-21 ENCOUNTER — Other Ambulatory Visit: Payer: Self-pay | Admitting: *Deleted

## 2011-05-29 ENCOUNTER — Encounter (HOSPITAL_COMMUNITY): Payer: Self-pay | Admitting: Respiratory Therapy

## 2011-06-04 MED ORDER — VANCOMYCIN HCL 1000 MG IV SOLR
1500.0000 mg | INTRAVENOUS | Status: DC
  Filled 2011-06-04: qty 1500

## 2011-06-04 MED ORDER — SODIUM CHLORIDE 0.9 % IV SOLN
INTRAVENOUS | Status: DC
  Administered 2011-06-05: 08:00:00 via INTRAVENOUS

## 2011-06-04 MED ORDER — SODIUM CHLORIDE 0.9 % IV SOLN
INTRAVENOUS | Status: DC
  Administered 2011-06-05: 07:00:00 via INTRAVENOUS

## 2011-06-05 ENCOUNTER — Ambulatory Visit (HOSPITAL_COMMUNITY)
Admission: RE | Admit: 2011-06-05 | Discharge: 2011-06-05 | Disposition: A | Discharge status: Home / Self Care | Payer: Medicare Other | Source: Ambulatory Visit | Attending: Vascular Surgery | Admitting: Vascular Surgery

## 2011-06-05 ENCOUNTER — Other Ambulatory Visit (HOSPITAL_COMMUNITY): Payer: Medicare Other

## 2011-06-05 ENCOUNTER — Encounter (HOSPITAL_COMMUNITY)
Admission: RE | Disposition: A | Discharge status: Home / Self Care | Payer: Self-pay | Source: Ambulatory Visit | Attending: Vascular Surgery

## 2011-06-05 DIAGNOSIS — E785 Hyperlipidemia, unspecified: Secondary | ICD-10-CM | POA: Insufficient documentation

## 2011-06-05 DIAGNOSIS — E119 Type 2 diabetes mellitus without complications: Secondary | ICD-10-CM | POA: Insufficient documentation

## 2011-06-05 DIAGNOSIS — I6529 Occlusion and stenosis of unspecified carotid artery: Secondary | ICD-10-CM | POA: Insufficient documentation

## 2011-06-05 DIAGNOSIS — I739 Peripheral vascular disease, unspecified: Secondary | ICD-10-CM | POA: Insufficient documentation

## 2011-06-05 DIAGNOSIS — I10 Essential (primary) hypertension: Secondary | ICD-10-CM | POA: Insufficient documentation

## 2011-06-05 DIAGNOSIS — E669 Obesity, unspecified: Secondary | ICD-10-CM | POA: Insufficient documentation

## 2011-06-05 HISTORY — PX: CAROTID ANGIOGRAM: SHX5504

## 2011-06-05 SURGERY — CAROTID ANGIOGRAM
Anesthesia: LOCAL

## 2011-06-05 MED ORDER — LIDOCAINE HCL (PF) 1 % IJ SOLN
INTRAMUSCULAR | Status: AC
  Filled 2011-06-05: qty 30

## 2011-06-05 MED ORDER — ACETAMINOPHEN 325 MG PO TABS
325.0000 mg | ORAL_TABLET | ORAL | Status: DC | PRN
  Administered 2011-06-05: 650 mg via ORAL

## 2011-06-05 MED ORDER — MORPHINE SULFATE 2 MG/ML IJ SOLN: 2.0000 mg | INTRAMUSCULAR | Status: DC | PRN

## 2011-06-05 MED ORDER — LABETALOL HCL 5 MG/ML IV SOLN: 10.0000 mg | INTRAVENOUS | Status: DC | PRN

## 2011-06-05 MED ORDER — ONDANSETRON HCL 4 MG/2ML IJ SOLN: 4.0000 mg | Freq: Four times a day (QID) | INTRAMUSCULAR | Status: DC | PRN

## 2011-06-05 MED ORDER — PHENOL 1.4 % MT LIQD: 1.0000 | OROMUCOSAL | Status: DC | PRN

## 2011-06-05 MED ORDER — HEPARIN (PORCINE) IN NACL 2-0.9 UNIT/ML-% IJ SOLN
INTRAMUSCULAR | Status: AC
  Filled 2011-06-05: qty 1000

## 2011-06-05 MED ORDER — ACETAMINOPHEN 325 MG RE SUPP: 325.0000 mg | RECTAL | Status: DC | PRN

## 2011-06-05 MED ORDER — SODIUM CHLORIDE 0.45 % IV SOLN: INTRAVENOUS | Status: DC

## 2011-06-05 MED ORDER — OXYCODONE HCL 5 MG PO TABS: 5.0000 mg | ORAL_TABLET | ORAL | Status: DC | PRN

## 2011-06-05 MED ORDER — METOPROLOL TARTRATE 1 MG/ML IV SOLN: 2.0000 mg | INTRAVENOUS | Status: DC | PRN

## 2011-06-05 MED ORDER — SODIUM CHLORIDE 0.9 % IV SOLN: 500.0000 mL | Freq: Once | INTRAVENOUS | Status: DC | PRN

## 2011-06-05 MED ORDER — GUAIFENESIN-DM 100-10 MG/5ML PO SYRP: 15.0000 mL | ORAL_SOLUTION | ORAL | Status: DC | PRN

## 2011-06-05 MED ORDER — HYDRALAZINE HCL 20 MG/ML IJ SOLN: 10.0000 mg | INTRAMUSCULAR | Status: DC | PRN

## 2011-06-05 MED ORDER — ACETAMINOPHEN 325 MG PO TABS
ORAL_TABLET | ORAL | Status: AC
  Filled 2011-06-05: qty 2

## 2011-06-05 NOTE — Op Note (Signed)
Procedure: Arch Aortogram                   Limited right lower extremity arteriogram                   Ultrasound of right groin  Preoperative diagnosis: High-grade asymptomatic right carotid stenosis  Postoperative diagnosis: Same  Anesthesia: Local  Operative details: After obtaining informed consent, the patient was taken to the [place] lab. The patient was placed in supine position the Angio table. Patient's right groin was prepped and draped in usual sterile fashion. Local anesthesia was infiltrated over the area the right common femoral artery. Due to the patient's obesity and calcification of the vessels the femoral pulse was fairly vague. Ultrasound was brought in the operative field and the right common femoral artery was identified. There was severe calcification. An attempt was made using micropuncture needle to cannulate the right common femoral artery. However upon cannulation of the artery I was not able to advance the guidewire. At this point fluoroscopy was used to identify the right femoral head. There was a easily identifiable calcification of the right common femoral artery. This was used as a landmark to cannulate the right common femoral artery under fluoroscopy. There was good pulsatile back flow. An 035 Amplatz wire was then threaded into the right femoral system. A 5 French sheath was then placed over this and thoroughly flushed with heparinized saline. Next a 5 French pigtail catheter was placed over the [facility] wire these were advanced as a unit into the aortic arch. Arch aortogram was then obtained. The patient's arch is anatomically normal. However, there is a high-grade greater than 70% stenosis at the origin of the innominate artery. There is some calcification at the origin of the right common carotid artery but this is not flow limiting. There is a high-grade stenosis greater than 90% with calcification at the carotid bifurcation extending into the internal carotid artery on  the right side. The right and left subclavian arteries are patent. The left common carotid artery is patent. The patient's recent left carotid endarterectomy is patent. Several oblique views were obtained to determine the extent of the plaque in the distal internal carotid artery and this extends up to the mandible but should be approachable from a cervical approach.  There is some calcification of the aortic arch and general. The vertebral arteries are patent bilaterally with antegrade flow the origins of these are not very well visualized. The right vertebral is dominant.  At this point I did not feel it was safe to cross the innominate lesion with a wire to get selective views. At this point the pigtail catheter was pulled back over a guidewire. An oblique view was then performed of the right groin to make sure where the needle stick site was in the common femoral artery. The cannulation site was just above the femoral bifurcation and again noted is calcification of the femoral vessel. The needle puncture site is in the vicinity of the femoral head.  The 5 French sheath was thoroughly flushed in the right groin. The patient was taken to the holding area in stable condition.  Operative findings: High-grade stenosis of the innominate artery as well as the right carotid bifurcation with severe calcification  Patient management: I will review the images and discuss a 10 to operative plan with Dr. Arvid Right from cardiac surgery to consider innominate reconstruction simultaneously with right carotid endarterectomy.  Ruta Hinds, MD [facility]  [facility] Office: [phone] Pager: [phone]

## 2011-06-05 NOTE — H&P (Signed)
[facility] [place]  HISTORY AND PHYSICAL  History of Present Illness: Patient is a 71 y.o. year old male who presents for follow-up evaluation for carotid stenosis. He is on Aspirin for antiplatelet therapy. His atherosclerotic risk factors remain diabetes, elevated cholesterol, hypertension, smoking (quit 2007), and coronary artery disease (recent negative stress test per pt at Dr. Jeralene Peters Smith's office). These are all currently stable. He denies any new neurologic events including amaurosis, numbness, or weakness. He recently underwent an uneventful left carotid endarterectomy. He returns for followup evaluation for a high-grade right carotid stenosis. He thinks at this point he is ready to proceed with right carotid endarterectomy. CT angiogram and duplex ultrasound suggested some proximal common carotid stenosis as well.   He has history of an aortobifemoral bypass for AAA by Dr. Amedeo Plenty in 2007.  Past Medical History   Diagnosis  Date   .  Diabetes mellitus    .  PVD (peripheral vascular disease)    .  Chest pain    .  Muscle pain    .  Diarrhea    .  Hypertension    .  Hyperlipidemia     Past Surgical History   Procedure  Date   .  Aortobifemoral bypass    .  Reimplantation of inferior mesenteric artery    .  Repair of infrarenal abdominal aortic aneurysm    .  Shoulder arthroscopy w/ rotator cuff repair      right   Review of Systems:  Neurologic: as above  Cardiac:denies shortness of breath or chest pain  Pulmonary: denies cough or wheeze  Musculoskeletal: as above  Skin: no rash  HEENT:negative  GI: no bleeding recently  General: no recent wt loss or gain  Social History  History   Substance Use Topics   .  Smoking status:  Former Smoker     Types:  Cigarettes     Quit date:  10/27/2005   .  Smokeless tobacco:  Not on file   .  Alcohol Use:  Yes      occasional   Allergies  Allergies   Allergen  Reactions   .  Augmentin  Nausea Only     Current Outpatient Prescriptions   Medication  Sig  Dispense  Refill   .  amLODipine-atorvastatin (CADUET) 5-20 MG per tablet      .  aspirin 325 MG tablet  Take 325 mg by mouth daily.     .  Cetirizine HCl (ZYRTEC ALLERGY PO)  Take by mouth as needed.     .  cilostazol (PLETAL) 100 MG tablet  Take 100 mg by mouth 2 (two) times daily.     Marland Kitchen  doxycycline (VIBRA-TABS) 100 MG tablet  Take 100 mg by mouth daily.     .  fenofibrate (TRICOR) 145 MG tablet  Take 145 mg by mouth daily.     Marland Kitchen  gabapentin (NEURONTIN) 300 MG capsule  Take 300 mg by mouth 2 (two) times daily.     Marland Kitchen  glipiZIDE (GLUCOTROL) 10 MG tablet  Take 10 mg by mouth daily.     .  metFORMIN (GLUCOPHAGE) 500 MG tablet  Take 1,000 mg by mouth 2 (two) times daily at 8 am and 4 pm.     .  nadolol (CORGARD) 80 MG tablet  Take 80 mg by mouth daily.     .  naproxen (NAPROSYN) 375 MG tablet  Take 375 mg by mouth 2 (two) times daily  with a meal.     .  NITROGLYCERIN PO  Take by mouth as needed.     .  pioglitazone (ACTOS) 30 MG tablet  Take 30 mg by mouth daily.     .  ramipril (ALTACE) 10 MG tablet  Take 10 mg by mouth 2 (two) times daily.     Physical Examination  Blood pressure 160/62, pulse 55, temperature 97.1 F (36.2 C), temperature source Oral, resp. rate 18, height 5\' 11"  (1.803 m), weight 282 lb (127.914 kg), SpO2 99.00%. General: Alert and oriented, no acute distress  HEENT: Normal  Neck: No bruit or JVD, healing neck incision left side Pulmonary: Clear to auscultation bilaterally  Cardiac: Regular Rate and Rhythm without murmur  Neurologic: Upper and lower extremity motor 5/5 and symmetric   Assessment: Possible right common carotid stenosis scheduled today for carotid angiogram for preoperative planning for right carotid endarterectomy risks benefits possible complications procedure details including but limited to bleeding infection embolic stroke were explained the patient today he understands and agrees to  proceed  Ruta Hinds, MD  [facility] [place]  Office: [phone]

## 2011-06-08 LAB — POCT I-STAT, CHEM 8
BUN: 26 mg/dL — ABNORMAL HIGH (ref 6–23)
Creatinine, Ser: 1.1 mg/dL (ref 0.50–1.35)
Hemoglobin: 13.3 g/dL (ref 13.0–17.0)
Potassium: 5.2 mEq/L — ABNORMAL HIGH (ref 3.5–5.1)
Sodium: 142 mEq/L (ref 135–145)

## 2011-06-09 ENCOUNTER — Encounter (HOSPITAL_COMMUNITY): Admission: RE | Payer: Self-pay | Source: Ambulatory Visit

## 2011-06-09 ENCOUNTER — Ambulatory Visit (HOSPITAL_COMMUNITY): Admission: RE | Admit: 2011-06-09 | Payer: Medicare Other | Source: Ambulatory Visit | Admitting: Vascular Surgery

## 2011-06-09 SURGERY — ENDARTERECTOMY, CAROTID
Anesthesia: General | Site: Neck | Laterality: Right

## 2011-06-10 ENCOUNTER — Encounter: Payer: Self-pay | Admitting: Vascular Surgery

## 2011-06-11 ENCOUNTER — Encounter: Payer: Self-pay | Admitting: Vascular Surgery

## 2011-06-11 ENCOUNTER — Ambulatory Visit (INDEPENDENT_AMBULATORY_CARE_PROVIDER_SITE_OTHER): Payer: Medicare Other | Admitting: Vascular Surgery

## 2011-06-11 VITALS — BP 155/74 | HR 52 | Resp 18 | Ht 71.0 in | Wt 300.8 lb

## 2011-06-11 DIAGNOSIS — I6529 Occlusion and stenosis of unspecified carotid artery: Secondary | ICD-10-CM

## 2011-06-11 NOTE — Progress Notes (Signed)
[facility] [place] HISTORY AND PHYSICAL   History of Present Illness:  Patient is a 71 y.o. year old male who presents for follow-up evaluation for carotid stenosis.  He recently had and arch aortogram which showed a 70% stenosis at the origin of his innominate which was heavily calcified.  This also confirmed a high grade internal carotid artery stenosis. He has recovered from his recent left carotid endarterectomy. He is on Aspirin for antiplatelet therapy.  His atherosclerotic risk factors remain diabetes, elevated cholesterol, hypertension, and coronary artery disease.  These are all currently stable and followed by his primary care physician.  He denies any new neurologic events including amaurosis, numbness, or weakness. He returns to the office today to review the operative findings of his arteriogram as well as to discuss operative plan for revascularization of his right carotid system.  Past Medical History  Diagnosis Date  . Diabetes mellitus   . Chest pain   . Muscle pain   . Diarrhea   . Hypertension   . Hyperlipidemia   . Hyperlipidemia   . Atherosclerotic heart disease   . PVD (peripheral vascular disease)   . Peripheral neuropathy   . Seasonal allergies   . Nasal polyps     hx of  . Fall     3wks ago and has a bruise under left eye  . Edema     right leg knee down swollen since fall 3 wks ago  . Back pain   . GERD (gastroesophageal reflux disease)     Rolaids as needed-occ reflux  . Colon polyps   . Renal insufficiency   . Urinary frequency     urgency/takes Vesicare daily  . Kidney stone     35+yrs ago  . Arthritis   . Pneumonia     as a child    Past Surgical History  Procedure Date  . Aortobifemoral bypass   . Reimplantation of inferior mesenteric artery   . Repair of infrarenal abdominal aortic aneurysm   . Shoulder arthroscopy w/ rotator cuff repair     right  . Tonsillectomy     at age 45  . Knee arthroscopy 2011  . Cataract  surgery     left  . Wisdom teeth extracted     as a teenager  . Cardiac catheterization 2003  . Carotid endarterectomy 04/08/2011    left CEA  . Endarterectomy 04/08/2011    Procedure: ENDARTERECTOMY CAROTID;  Surgeon: Elam Dutch, MD;  Location: [facility] OR;  Service: Vascular;  Laterality: Left;  with patch angioplasty    Review of Systems:  Neurologic: as above Cardiac:denies shortness of breath or chest pain Pulmonary: denies cough or wheeze  Social History History  Substance Use Topics  . Smoking status: Former Smoker -- 40 years    Types: Cigarettes    Quit date: 10/27/2005  . Smokeless tobacco: Not on file  . Alcohol Use: 0.0 oz/week    2-3 Cans of beer per week     occasional    Allergies  Allergies  Allergen Reactions  . Augmentin Nausea Only     Current Outpatient Prescriptions  Medication Sig Dispense Refill  . amLODipine-atorvastatin (CADUET) 5-20 MG per tablet Take 1 tablet by mouth daily.       Marland Kitchen aspirin 325 MG tablet Take 325 mg by mouth daily.       Marland Kitchen b complex vitamins capsule Take 1 capsule by mouth daily.        Marland Kitchen  bisacodyl (DULCOLAX) 10 MG suppository Place 10 mg rectally as needed.        . Cetirizine HCl (ZYRTEC ALLERGY PO) Take 1 tablet by mouth daily as needed. For allergies      . cilostazol (PLETAL) 100 MG tablet Take 100 mg by mouth 2 (two) times daily.       Marland Kitchen docusate sodium (COLACE) 100 MG capsule Take 100 mg by mouth 2 (two) times daily as needed.        . doxycycline (VIBRA-TABS) 100 MG tablet Take 100 mg by mouth daily.       . fenofibrate (TRICOR) 145 MG tablet Take 145 mg by mouth daily.       Marland Kitchen gabapentin (NEURONTIN) 300 MG capsule Take 300 mg by mouth 2 (two) times daily.       Marland Kitchen glipiZIDE (GLUCOTROL) 10 MG tablet Take 10 mg by mouth daily.       Marland Kitchen loperamide (IMODIUM) 1 MG/5ML solution Take by mouth 4 (four) times daily as needed.        . metFORMIN (GLUCOPHAGE) 500 MG tablet Take 1,000 mg by mouth 2 (two) times daily with a  meal.       . nadolol (CORGARD) 80 MG tablet Take 80 mg by mouth daily.       . nitroGLYCERIN (NITROSTAT) 0.4 MG SL tablet Place 0.4 mg under the tongue every 5 (five) minutes as needed. For chest pain       . ondansetron (ZOFRAN) 4 MG tablet Take 1 tablet by mouth Every 8 hours as needed. For nausea      . oxyCODONE-acetaminophen (PERCOCET) 5-325 MG per tablet Take 1 tablet by mouth Every 8 hours as needed. For pain      . pioglitazone (ACTOS) 30 MG tablet Take 15 mg by mouth daily.       . pseudoephedrine-guaifenesin (MUCINEX D) 60-600 MG per tablet Take 1 tablet by mouth as needed.       . ramipril (ALTACE) 10 MG tablet Take 10 mg by mouth 2 (two) times daily.       . temazepam (RESTORIL) 15 MG capsule Take 15 mg by mouth at bedtime as needed.       Marland Kitchen NAPRELAN 500 MG 24 hr tablet Take 500 mg by mouth Twice daily as needed. For pain        Physical Examination  Filed Vitals:   06/11/11 1545  BP: 155/74  Pulse: 52  Resp: 18  Height: 5\' 11"  (1.803 m)  Weight: 300 lb 12.8 oz (136.442 kg)    Body mass index is 41.95 kg/(m^2).  General:  Alert and oriented, no acute distress HEENT: Normal Neck: Left neck incision healed Neurologic: Upper and lower extremity motor 5/5 and symmetric  Lengthy discussion held with the patient and his daughter and wife today. I discussed that we will need to do an aorto to innominate bypass requiring partial or. Not any as well as a combined right carotid endarterectomy. I told them that I have are discussed this with Dr. Arvid Right who would be a co-surgeon on the procedure. I also informed them that I spoke with Dr. Pernell Dupre earlier today and he would need repeat cardiac catheterization prior to that to determine whether or not combined cardiac bypass grafting would need to be performed as well.   ASSESSMENT: High-grade innominate artery stenosis requiring bypass as well as high-grade right internal carotid artery stenosis requiring  endarterectomy.   PLAN: Dr. Tamala Julian will contact the  patient regarding scheduling for a cardiac catheterization. Based on the cardiac cath findings will decide whether or not combined coronary bypass grafting would be necessary. After we received this information will discuss with Dr. Cyndia Bent appropriate operative date. Risks benefits possible complications and procedure details of carotid endarterectomy combined with aorto innominate bypass were discussed with the patient today. These were including but not limited to bleeding death stroke myocardial infarction wound problems. He understands and agrees to proceed.  Ruta Hinds, MD [facility] [place] Office: [phone] Pager: [phone]

## 2011-06-19 ENCOUNTER — Inpatient Hospital Stay (HOSPITAL_BASED_OUTPATIENT_CLINIC_OR_DEPARTMENT_OTHER): Admit: 2011-06-19 | Payer: Self-pay | Admitting: Interventional Cardiology

## 2011-06-19 ENCOUNTER — Encounter (HOSPITAL_BASED_OUTPATIENT_CLINIC_OR_DEPARTMENT_OTHER): Payer: Self-pay

## 2011-06-19 SURGERY — JV LEFT HEART CATHETERIZATION WITH CORONARY ANGIOGRAM
Anesthesia: Moderate Sedation

## 2011-07-07 ENCOUNTER — Other Ambulatory Visit: Payer: Self-pay | Admitting: Interventional Cardiology

## 2011-07-08 NOTE — Progress Notes (Signed)
Addended by: Daneen Schick III on: 07/08/2011 08:04 PM   Modules accepted: Orders

## 2011-07-09 ENCOUNTER — Ambulatory Visit (HOSPITAL_COMMUNITY)
Admission: RE | Admit: 2011-07-09 | Discharge: 2011-07-09 | Disposition: A | Discharge status: Home / Self Care | Payer: Medicare Other | Source: Ambulatory Visit | Attending: Interventional Cardiology | Admitting: Interventional Cardiology

## 2011-07-09 ENCOUNTER — Encounter (HOSPITAL_COMMUNITY)
Admission: RE | Disposition: A | Discharge status: Home / Self Care | Payer: Self-pay | Source: Ambulatory Visit | Attending: Interventional Cardiology

## 2011-07-09 DIAGNOSIS — I2510 Atherosclerotic heart disease of native coronary artery without angina pectoris: Secondary | ICD-10-CM | POA: Insufficient documentation

## 2011-07-09 HISTORY — PX: LEFT HEART CATHETERIZATION WITH CORONARY ANGIOGRAM: SHX5451

## 2011-07-09 LAB — GLUCOSE, CAPILLARY
Glucose-Capillary: 143 mg/dL — ABNORMAL HIGH (ref 70–99)
Glucose-Capillary: 117 mg/dL — ABNORMAL HIGH (ref 70–99)

## 2011-07-09 SURGERY — LEFT HEART CATHETERIZATION WITH CORONARY ANGIOGRAM
Anesthesia: LOCAL

## 2011-07-09 MED ORDER — SODIUM CHLORIDE 0.9 % IV SOLN: 250.0000 mL | INTRAVENOUS | Status: DC | PRN

## 2011-07-09 MED ORDER — SODIUM CHLORIDE 0.9 % IV SOLN
INTRAVENOUS | Status: DC
  Administered 2011-07-09: 09:00:00 via INTRAVENOUS

## 2011-07-09 MED ORDER — HEPARIN (PORCINE) IN NACL 2-0.9 UNIT/ML-% IJ SOLN
INTRAMUSCULAR | Status: AC
  Filled 2011-07-09: qty 2000

## 2011-07-09 MED ORDER — HEPARIN SODIUM (PORCINE) 1000 UNIT/ML IJ SOLN
INTRAMUSCULAR | Status: AC
  Filled 2011-07-09: qty 1

## 2011-07-09 MED ORDER — SODIUM CHLORIDE 0.9 % IJ SOLN: 3.0000 mL | Freq: Two times a day (BID) | INTRAMUSCULAR | Status: DC

## 2011-07-09 MED ORDER — VERAPAMIL HCL 2.5 MG/ML IV SOLN
INTRAVENOUS | Status: AC
  Filled 2011-07-09: qty 2

## 2011-07-09 MED ORDER — LIDOCAINE HCL (PF) 1 % IJ SOLN
INTRAMUSCULAR | Status: AC
  Filled 2011-07-09: qty 30

## 2011-07-09 MED ORDER — MIDAZOLAM HCL 2 MG/2ML IJ SOLN
INTRAMUSCULAR | Status: AC
  Filled 2011-07-09: qty 2

## 2011-07-09 MED ORDER — FENTANYL CITRATE 0.05 MG/ML IJ SOLN
INTRAMUSCULAR | Status: AC
  Filled 2011-07-09: qty 2

## 2011-07-09 MED ORDER — SODIUM CHLORIDE 0.9 % IJ SOLN: 3.0000 mL | INTRAMUSCULAR | Status: DC | PRN

## 2011-07-09 MED ORDER — DIAZEPAM 5 MG PO TABS
5.0000 mg | ORAL_TABLET | ORAL | Status: AC
  Administered 2011-07-09: 5 mg via ORAL
  Filled 2011-07-09: qty 1

## 2011-07-09 MED ORDER — ASPIRIN 81 MG PO CHEW
324.0000 mg | CHEWABLE_TABLET | ORAL | Status: AC
  Administered 2011-07-09: 81 mg via ORAL
  Filled 2011-07-09: qty 4

## 2011-07-09 MED ORDER — NITROGLYCERIN 0.2 MG/ML ON CALL CATH LAB
INTRAVENOUS | Status: AC
  Filled 2011-07-09: qty 1

## 2011-07-09 NOTE — Brief Op Note (Signed)
07/09/2011  10:03 AM  PATIENT:  [name]  71 y.o. male  PRE-OPERATIVE DIAGNOSIS:  Chest pain  POST-OPERATIVE DIAGNOSIS:  CAD PROCEDURE:  Procedure(s) (LRB): LEFT HEART CATHETERIZATION WITH CORONARY ANGIOGRAM (N/A)  SURGEON:  Surgeon(s) and Role:    * Sinclair Grooms, MD - Primary  PHYSICIAN ASSISTANT:   ASSISTANTS: none   ANESTHESIA:   Moderate IV sedation

## 2011-07-09 NOTE — H&P (Signed)
  Saw patient in the short stay area. No change in status since OV and no angina. Discussed the procedure and risks, including death, MI, CVA, limb ischemia, etc. He consents to proceeding.

## 2011-07-09 NOTE — Op Note (Signed)
     Diagnostic Cardiac Catheterization Report  CRAWFORD GUADAGNINO  71 y.o.  male [birth year]  Procedure Date: 07/09/2011  Referring Physician: Ruta Hinds M.D. / Gilford Raid MD  Primary Cardiologist:: Rosanna Randy, III, M.D.   PROCEDURE:  Left heart catheterization with selective coronary angiography, left ventriculogram.  INDICATIONS:  Preop evaluation prior to carotid endarterectomy and thoracic bypass of right innominate artery obstruction. The patient has a history of coronary artery disease with chronic total occlusion of the right coronary and intermediate disease in the LAD dating back many years.  The risks, benefits, and details of the procedure were explained to the patient.  The patient verbalized understanding and wanted to proceed.  Informed written consent was obtained.  PROCEDURE TECHNIQUE:  After Xylocaine anesthesia a 5 French sheath was placed in the left radial artery with a single anterior needle wall stick.   Coronary angiography was done using a 5 [place] A2 multipurpose, 5 [place] #4 left Judkins, and the 5 [place] JR 4 catheters.  Left ventriculography was done using a 5 [place] A2 multipurpose catheter.    CONTRAST:  Total of 125 cc.  COMPLICATIONS:  None.    HEMODYNAMICS:  Aortic pressure was 162/58 mmHg; LV pressure was 162/8 mmHg; LVEDP 17 mm mercury.  There was no gradient between the left ventricle and aorta.    ANGIOGRAPHIC DATA:   The left main coronary artery is widely patent. Moderate calcification is noted..  The left anterior descending artery is transapical. The first septal perforator has 80% ostial narrowing. There is an eccentric heavily calcified proximal LAD stenosis in the 50-70% range..  The left circumflex artery is large vessel that gives collaterals to the distal right coronary via the left atrial recurrent. There is generalized 50% narrowing in the proximal circumflex, 40% narrowing in the ostium of the first obtuse marginal, and 50-60%  stenosis in the proximal portion of the second obtuse marginal. The proximal circumflex is heavily calcified..  The right coronary artery is totally occluded proximally. Heavy calcification is noted throughout the remainder of the vessel. The distal vessel fills by collaterals from the left circumflex.  LEFT VENTRICULOGRAM:  Left ventricular angiogram was done in the 30 RAO projection and revealed normal left ventricular wall motion and systolic function with an estimated ejection fraction of 55%  IMPRESSIONS:  1. Significant coronary disease with chronic total occlusion of the right coronary, moderately severe proximal LAD (eccentric 50-70%), and mild to moderate circumflex disease. The right coronary is collateralized from the left circumflex.  2. Normal left ventricular function, LVEF 55%   RECOMMENDATION:  Will discuss with Dr. Oneida Alar and Dr. Cyndia Bent.Marland Kitchen

## 2011-07-09 NOTE — Discharge Instructions (Signed)
Radial Site Care Refer to this sheet in the next few weeks. These instructions provide you with information on caring for yourself after your procedure. Your caregiver may also give you more specific instructions. Your treatment has been planned according to current medical practices, but problems sometimes occur. Call your caregiver if you have any problems or questions after your procedure. HOME CARE INSTRUCTIONS  You may shower the day after the procedure.Remove the bandage (dressing) and gently wash the site with plain soap and water.Gently pat the site dry.   Do not apply powder or lotion to the site.   Do not submerge the affected site in water for 3 to 5 days.   Inspect the site at least twice daily.   Do not flex or bend the affected arm for 24 hours.   No lifting over 5 pounds (2.3 kg) for 5 days after your procedure.   Do not drive home if you are discharged the same day of the procedure. Have someone else drive you.   You may drive 24 hours after the procedure unless otherwise instructed by your caregiver.  What to expect:  Any bruising will usually fade within 1 to 2 weeks.   Blood that collects in the tissue (hematoma) may be painful to the touch. It should usually decrease in size and tenderness within 1 to 2 weeks.  SEEK IMMEDIATE MEDICAL CARE IF:  You have unusual pain at the radial site.   You have redness, warmth, swelling, or pain at the radial site.   You have drainage (other than a small amount of blood on the dressing).   You have chills.   You have a fever or persistent symptoms for more than 72 hours.   You have a fever and your symptoms suddenly get worse.   Your arm becomes pale, cool, tingly, or numb.   You have heavy bleeding from the site. Hold pressure on the site.  Document Released: 06/13/2010 Document Revised: 01/21/2011 Document Reviewed: 06/13/2010 [employer] Patient Information 2012 [employer]. 

## 2011-08-11 ENCOUNTER — Institutional Professional Consult (permissible substitution) (INDEPENDENT_AMBULATORY_CARE_PROVIDER_SITE_OTHER): Payer: Medicare Other | Admitting: Surgery

## 2011-08-11 ENCOUNTER — Encounter: Payer: Self-pay | Admitting: Surgery

## 2011-08-11 VITALS — BP 134/61 | HR 55 | Resp 18 | Ht 71.0 in | Wt 299.0 lb

## 2011-08-11 DIAGNOSIS — I2510 Atherosclerotic heart disease of native coronary artery without angina pectoris: Secondary | ICD-10-CM

## 2011-08-11 DIAGNOSIS — I771 Stricture of artery: Secondary | ICD-10-CM

## 2011-08-12 ENCOUNTER — Encounter: Payer: Self-pay | Admitting: Surgery

## 2011-08-12 NOTE — Progress Notes (Signed)
[address]            [place] [address]          [phone]      PCP is Irven Shelling, MD, MD Referring Provider is Anthony Dutch, MD  Chief Complaint  Patient presents with  . 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 Oneida [name] in November of 2012. His workup also showed that he had a high-grade right internal carotid artery stenosis and a calcified 70% stenosis at the origin of the innominate artery. According to his family he does have much clearer mental status since his left carotid endarterectomy and is otherwise asymptomatic at this time. He underwent cardiac catheterization by Dr. Tamala [name] in preparation for planned right carotid endarterectomy and aorto- innominate bypass. This showed significant three-vessel coronary disease.  Past Medical History  Diagnosis Date  . Diabetes mellitus   . Chest pain   . Muscle pain   . Diarrhea   . Hypertension   . Hyperlipidemia   . Hyperlipidemia   . Atherosclerotic heart disease   . PVD (peripheral vascular disease)   . Peripheral neuropathy   . Seasonal allergies   . Nasal polyps     hx of  . Fall     3wks ago and has a bruise under left eye  . Edema     right leg knee down swollen since fall 3 wks ago  . Back pain   . GERD (gastroesophageal reflux disease)     Rolaids as needed-occ reflux  . Colon polyps   . Renal insufficiency   . Urinary frequency     urgency/takes Vesicare daily  . Kidney stone     35+yrs ago  . Arthritis   . Pneumonia     as a child    Past Surgical History  Procedure Date  . Aortobifemoral bypass   . Reimplantation of inferior mesenteric artery   . Repair of infrarenal abdominal aortic aneurysm   . Shoulder  arthroscopy w/ rotator cuff repair     right  . Tonsillectomy     at age 46  . Knee arthroscopy 2011  . Cataract surgery     left  . Wisdom teeth extracted     as a teenager  . Cardiac catheterization 2003  . Carotid endarterectomy 04/08/2011    left CEA  . Endarterectomy 04/08/2011    Procedure: ENDARTERECTOMY CAROTID;  Surgeon: Anthony Dutch, MD;  Location: [facility] OR;  Service: Vascular;  Laterality: Left;  with patch angioplasty    Family History  Problem Relation Age of Onset  . Heart disease Mother   . Heart disease Father   . Anesthesia problems Neg Hx   . Hypotension Neg Hx   . Malignant hyperthermia Neg Hx   . Pseudochol deficiency Neg Hx     Social History History  Substance Use Topics  . Smoking status: Former Smoker -- 40 years    Types: Cigarettes    Quit date: 10/27/2005  . Smokeless tobacco: Not on file  . Alcohol Use: 0.0 oz/week    2-3 Cans of beer per week  occasional    Current Outpatient Prescriptions  Medication Sig Dispense Refill  . amLODipine (NORVASC) 5 MG tablet Take 5 mg by mouth daily.      Marland Kitchen aspirin 325 MG tablet Take 325 mg by mouth daily.       Marland Kitchen atorvastatin (LIPITOR) 20 MG tablet Take 20 mg by mouth daily.      Marland Kitchen b complex vitamins capsule Take 1 capsule by mouth daily.        . bisacodyl (DULCOLAX) 10 MG suppository Place 10 mg rectally as needed.        . Cetirizine HCl (ZYRTEC ALLERGY PO) Take 1 tablet by mouth daily as needed. For allergies      . cilostazol (PLETAL) 100 MG tablet Take 100 mg by mouth 2 (two) times daily.       Marland Kitchen doxycycline (VIBRA-TABS) 100 MG tablet Take 100 mg by mouth daily.       . fenofibrate (TRICOR) 145 MG tablet Take 145 mg by mouth daily.       Marland Kitchen gabapentin (NEURONTIN) 300 MG capsule Take 300 mg by mouth 2 (two) times daily.       Marland Kitchen glipiZIDE (GLUCOTROL) 10 MG tablet Take 10 mg by mouth daily.       Marland Kitchen loperamide (IMODIUM) 1 MG/5ML solution Take by mouth 4 (four) times daily as needed.        .  metFORMIN (GLUCOPHAGE) 500 MG tablet Take 1,000 mg by mouth 2 (two) times daily with a meal.       . nadolol (CORGARD) 80 MG tablet Take 80 mg by mouth daily.       . nitroGLYCERIN (NITROSTAT) 0.4 MG SL tablet Place 0.4 mg under the tongue every 5 (five) minutes as needed. For chest pain       . pioglitazone (ACTOS) 30 MG tablet Take 15 mg by mouth daily.       . pseudoephedrine-guaifenesin (MUCINEX D) 60-600 MG per tablet Take 1 tablet by mouth as needed.       . ramipril (ALTACE) 10 MG tablet Take 10 mg by mouth 2 (two) times daily.       . temazepam (RESTORIL) 15 MG capsule Take 15 mg by mouth at bedtime as needed. For sleep        Allergies  Allergen Reactions  . Augmentin Nausea Only    Review of Systems:  General: He denies any fever or chills. He does report some weight gain and said that he likes to eat. He denies fatigue. Eyes: Negative ENT: Negative Endocrine: He has adult onset diabetes. Cardiovascular: He denies any chest pressure or pain. He denies PND and orthopnea. He denies exertional dyspnea but is not very active due to lower extremity  claudication. He denies peripheral edema. Respiratory: Denies cough and sputum production. GI: He denies nausea and vomiting. He denies melena and bright red blood per rectum. GU: Denies dysuria and hematuria. He does have frequent urination. Neurological: He denies any focal weakness or numbness. He denies dizziness and syncope. Vascular: He reports claudication in both calves when walking less than one half block. This resolves with resting. He denies any history of DVT, phlebitis, or vein stripping. Musculoskeletal: He does report some myalgias.  BP 134/61  Pulse 55  Resp 18  Ht 5\' 11"  (1.803 m)  Wt 299 lb (135.626 kg)  BMI 41.70 kg/m2  SpO2 92% Physical Exam:  He is an obese white male in no distress. HEENT: Normocephalic and atraumatic. Pupils are  equal and reactive to light. Extraocular muscles are intact. Oropharynx is  clear. Neck: Carotid pulses are palpable bilaterally. There is a right cervical bruit. There is a left carotid endarterectomy scar. There is no adenopathy or thyromegaly. Heart: Regular rate and rhythm with normal S1 and S2. There is no murmur, rub, or gallop. Lungs: Clear Abdomen: Bowel sounds are present. Soft and nontender. Obese. There are no palpable masses or organomegaly. Extremities: There is no peripheral edema. Pedal pulses are not palpable. Neurological: Alert and oriented x3. Motor and sensory exams are grossly normal.  Diagnostic Tests:  CT ANGIOGRAPHY NECK   Technique:  Multidetector CT imaging of the neck was performed using the standard protocol during bolus administration of intravenous contrast.  Multiplanar CT image reconstructions including MIPs were obtained to evaluate the vascular anatomy. Carotid stenosis measurements (when applicable) are obtained utilizing NASCET criteria, using the distal internal carotid diameter as the denominator.   Contrast: 171mL OMNIPAQUE IOHEXOL 350 MG/ML IV SOLN   Comparison:  Head and face CT 03/09/2011.  Carotid Doppler ultrasound report 03/19/2011 (no images available).   Findings:  Negative visualized lung parenchyma.  Negative thyroid, larynx, pharyngeal mucosal spaces, parapharyngeal spaces, retropharyngeal space, sublingual space, submandibular glands and parotid glands.  No cervical lymphadenopathy.  No superior mediastinal lymphadenopathy.  Degenerative changes in the cervical spine. No acute osseous abnormality identified.  Chronic maxillary sinus disease with complete opacification of the visualized sinuses.   Vascular Findings: Calcified atherosclerosis in the aortic arch affecting the great vessel origins.  The no hemodynamically significant subclavian artery stenosis.   Proximal right common carotid artery stenosis due to calcified plaque at the thoracic inlet measures 50 % with respect to the distal vessel.  Beyond this level the right common carotid artery is only mildly atherosclerotic to the right carotid bifurcation. At the right carotid bifurcation there is dense calcified plaque resulting in a radiographic string sign (series 401 image 187). Calcified plaque continues into the medial right ICA origin and bulb.  Beyond the level of cervical right ICA is tortuous and has a partially retropharyngeal course.  There is calcified plaque in the posterior right ICA at the skull base and continuing into the right ICA siphon.   The right vertebral artery origin is heavily calcified with at least moderate stenosis.  Circumferential calcified plaque occurs in the right V2 segment with severe stenosis (C4-C5 level, series 4 and on image 156).  The right vertebral artery remains patent throughout the neck.  There is circumferential calcified plaque again at its crossing into the skull base (image 229) and in the visualized right V4 segment.  The right PICA is patent.   No significant stenosis at the left common carotid artery origin or proximal course.   At the left carotid bifurcation there is dense plaque similar to that on the right resulting in a radiographic string sign (image 189).  Calcified plaque continues into the left ICA origin and along the posterior bulb and is more elongated but not on the right.  The left ICA remains patent and has a partially retropharyngeal course.  There is calcified plaque at the skull base and throughout the visualized left ICA siphon similar to that on the right.  The left vertebral artery origin is heavily calcified and at least moderately stenotic.  The left vertebral is nondominant.  There is intermittent calcified atherosclerosis throughout its course.  Is patent to the skull base and functionally terminates in the left PICA which is patent.  Review of the MIP images confirms the above findings.   IMPRESSION: 1.  Dense, confluent calcified plaque  at both carotid bifurcations resulting in radiographic string signs at both ICA origins. Calcified plaque continues into the proximal aspects of both ICAs, worse on the left. Extensive calcified plaque also in the visualized ICA siphons. 2.  Calcified plaque in the proximal right common carotid artery resulting in 50% stenosis with respect to the distal vessel.  3.  Dominant right vertebral artery with moderate origin stenosis and severe right V2 segment stenosis (C4-C5 level). 4.  Nondominant left vertebral artery which functionally terminating PICA.   Original Report Authenticated By: Randall An, M.D. PROCEDURE:  Left heart catheterization with selective coronary angiography, left ventriculogram.  INDICATIONS:  Preop evaluation prior to carotid endarterectomy and thoracic bypass of right innominate artery obstruction. The patient has a history of coronary artery disease with chronic total occlusion of the right coronary and intermediate disease in the LAD dating back many years.  The risks, benefits, and details of the procedure were explained to the patient.  The patient verbalized understanding and wanted to proceed.  Informed written consent was obtained.  PROCEDURE TECHNIQUE:  After Xylocaine anesthesia a 5 French sheath was placed in the left radial artery with a single anterior needle wall stick.   Coronary angiography was done using a 5 [place] A2 multipurpose, 5 [place] #4 left Judkins, and the 5 [place] JR 4 catheters.  Left ventriculography was done using a 5 [place] A2 multipurpose catheter.      CONTRAST:  Total of 125 cc.  COMPLICATIONS:  None.    HEMODYNAMICS:  Aortic pressure was 162/58 mmHg; LV pressure was 162/8 mmHg; LVEDP 17 mm mercury.  There was no gradient between the left ventricle and aorta.    ANGIOGRAPHIC DATA:   The left main coronary artery is widely patent. Moderate calcification is noted..  The left anterior descending artery is transapical. The first  septal perforator has 80% ostial narrowing. There is an eccentric heavily calcified proximal LAD stenosis in the 50-70% range..  The left circumflex artery is large vessel that gives collaterals to the distal right coronary via the left atrial recurrent. There is generalized 50% narrowing in the proximal circumflex, 40% narrowing in the ostium of the first obtuse marginal, and 50-60% stenosis in the proximal portion of the second obtuse marginal. The proximal circumflex is heavily calcified..  The right coronary artery is totally occluded proximally. Heavy calcification is noted throughout the remainder of the vessel. The distal vessel fills by collaterals from the left circumflex.  LEFT VENTRICULOGRAM:  Left ventricular angiogram was done in the 30 RAO projection and revealed normal left ventricular wall motion and systolic function with an estimated ejection fraction of 55%  IMPRESSIONS:  1. Significant coronary disease with chronic total occlusion of the right coronary, moderately severe proximal LAD (eccentric 50-70%), and mild to moderate circumflex disease. The right coronary is collateralized from the left circumflex.  2. Normal left ventricular function, LVEF 55%     Impression:  [name] has high-grade right internal carotid artery stenosis and 70% calcified stenosis at the origin of his innominate artery. He requires right carotid endarterectomy and aorto- innominate bypass. He also had significant three-vessel coronary disease and will require coronary bypass graft surgery at the same time. I discussed the operative procedure with the patient and family including alternatives, benefits and risks; including but not limited to bleeding, blood transfusion, infection, stroke, myocardial infarction, graft failure, heart block  requiring a permanent pacemaker, organ dysfunction, and death.  [name] understands and agrees to proceed.   Plan:  I will coordinate the timing of his  surgery with Dr. Juanda [place]. The patient would like to wait until after 08/31/11.

## 2011-08-17 ENCOUNTER — Other Ambulatory Visit: Payer: Self-pay

## 2011-08-17 ENCOUNTER — Other Ambulatory Visit: Payer: Self-pay | Admitting: Surgery

## 2011-08-17 DIAGNOSIS — I771 Stricture of artery: Secondary | ICD-10-CM

## 2011-08-17 DIAGNOSIS — I2510 Atherosclerotic heart disease of native coronary artery without angina pectoris: Secondary | ICD-10-CM

## 2011-08-20 ENCOUNTER — Other Ambulatory Visit: Payer: Self-pay

## 2011-09-07 ENCOUNTER — Encounter (HOSPITAL_COMMUNITY)
Admission: RE | Admit: 2011-09-07 | Discharge: 2011-09-07 | Disposition: A | Discharge status: Home / Self Care | Payer: Medicare Other | Source: Ambulatory Visit | Attending: Surgery | Admitting: Surgery

## 2011-09-07 ENCOUNTER — Encounter (HOSPITAL_COMMUNITY): Payer: Self-pay | Admitting: Pharmacy Technician

## 2011-09-07 ENCOUNTER — Other Ambulatory Visit (HOSPITAL_COMMUNITY): Payer: Self-pay | Admitting: Radiology

## 2011-09-07 ENCOUNTER — Encounter (HOSPITAL_COMMUNITY): Payer: Self-pay

## 2011-09-07 ENCOUNTER — Ambulatory Visit (HOSPITAL_COMMUNITY)
Admission: RE | Admit: 2011-09-07 | Discharge: 2011-09-07 | Disposition: A | Discharge status: Home / Self Care | Payer: Medicare Other | Source: Ambulatory Visit | Attending: Surgery | Admitting: Surgery

## 2011-09-07 ENCOUNTER — Inpatient Hospital Stay (HOSPITAL_COMMUNITY)
Admission: RE | Admit: 2011-09-07 | Discharge: 2011-09-07 | Disposition: A | Discharge status: Home / Self Care | Payer: Medicare Other | Source: Ambulatory Visit | Attending: Surgery | Admitting: Surgery

## 2011-09-07 ENCOUNTER — Ambulatory Visit (HOSPITAL_COMMUNITY): Payer: Medicare Other

## 2011-09-07 VITALS — BP 162/59 | HR 57 | Temp 97.7°F | Resp 20 | Ht 71.0 in | Wt 295.3 lb

## 2011-09-07 DIAGNOSIS — I771 Stricture of artery: Secondary | ICD-10-CM

## 2011-09-07 DIAGNOSIS — Z01818 Encounter for other preprocedural examination: Secondary | ICD-10-CM | POA: Insufficient documentation

## 2011-09-07 DIAGNOSIS — Z01810 Encounter for preprocedural cardiovascular examination: Secondary | ICD-10-CM | POA: Insufficient documentation

## 2011-09-07 DIAGNOSIS — I2510 Atherosclerotic heart disease of native coronary artery without angina pectoris: Secondary | ICD-10-CM

## 2011-09-07 DIAGNOSIS — Z01812 Encounter for preprocedural laboratory examination: Secondary | ICD-10-CM | POA: Insufficient documentation

## 2011-09-07 HISTORY — DX: Personal history of urinary calculi: Z87.442

## 2011-09-07 HISTORY — DX: Edema, unspecified: R60.9

## 2011-09-07 HISTORY — DX: Other skin changes: R23.8

## 2011-09-07 HISTORY — DX: Reserved for concepts with insufficient information to code with codable children: IMO0002

## 2011-09-07 HISTORY — DX: Unspecified cataract: H26.9

## 2011-09-07 HISTORY — DX: Localized edema: R60.0

## 2011-09-07 HISTORY — DX: Spontaneous ecchymoses: R23.3

## 2011-09-07 LAB — PULMONARY FUNCTION TEST

## 2011-09-07 LAB — PROTIME-INR
INR: 1 (ref 0.00–1.49)
Prothrombin Time: 13.4 seconds (ref 11.6–15.2)

## 2011-09-07 LAB — URINALYSIS, ROUTINE W REFLEX MICROSCOPIC
Bilirubin Urine: NEGATIVE
Hgb urine dipstick: NEGATIVE
Protein, ur: 100 mg/dL — AB
Urobilinogen, UA: 0.2 mg/dL (ref 0.0–1.0)

## 2011-09-07 LAB — COMPREHENSIVE METABOLIC PANEL
BUN: 22 mg/dL (ref 6–23)
Calcium: 9.8 mg/dL (ref 8.4–10.5)
GFR calc Af Amer: >90 mL/min (ref >90)
Glucose, Bld: 128 mg/dL — ABNORMAL HIGH (ref 70–99)
Total Protein: 6.5 g/dL (ref 6.0–8.3)

## 2011-09-07 LAB — CBC
HCT: 40.3 % (ref 39.0–52.0)
Hemoglobin: 13.2 g/dL (ref 13.0–17.0)
MCH: 30.7 pg (ref 26.0–34.0)
MCHC: 32.8 g/dL (ref 30.0–36.0)

## 2011-09-07 LAB — SURGICAL PCR SCREEN: Staphylococcus aureus: NEGATIVE

## 2011-09-07 LAB — URINE MICROSCOPIC-ADD ON

## 2011-09-07 MED ORDER — CHLORHEXIDINE GLUCONATE 4 % EX LIQD: 30.0000 mL | CUTANEOUS | Status: DC

## 2011-09-07 MED ORDER — ALBUTEROL SULFATE (5 MG/ML) 0.5% IN NEBU
2.5000 mg | INHALATION_SOLUTION | Freq: Once | RESPIRATORY_TRACT | Status: AC
  Administered 2011-09-07: 2.5 mg via RESPIRATORY_TRACT

## 2011-09-07 NOTE — Progress Notes (Signed)
Pre-op Cardiac Surgery    Upper Extremity Right Left  Brachial Pressures 145-Biphasic 159-Biphasic  Radial Waveforms Biphasic Biphasic  Ulnar Waveforms Biphasic Biphasic  Palmar Arch (Allen's Test) Doppler signal obliterates with radial compression, doppler signal remains unchanged with ulnar compression. Doppler signal remains unchanged with radial compression, doppler signal obliterates with ulnar compression.      Lower  Extremity Right Left  Dorsalis Pedis 84-Monophasic Unable to insonate  Anterior Tibial    Posterior Tibial 80-Dampened monophasic 66- Severely dampened monophasic  Ankle/Brachial Indices 0.53 0.42    Findings:  Right ABI indicates moderate disease, left ABI indicates severe disease.

## 2011-09-07 NOTE — Progress Notes (Signed)
Confirmed that PFT and Dopplers were done 09/07/11

## 2011-09-07 NOTE — Progress Notes (Signed)
Dr.[name] is cardiologist with last visit in epic from 06/2011  Stress test done about a yr ago  Denies ever having an echo  Several heart caths with last one in 05/2011-results in [place] is Dr.[name]  @ [place] at [employer]

## 2011-09-07 NOTE — Pre-Procedure Instructions (Signed)
[place]  09/07/2011   Your procedure is scheduled on:  Wed, April 17 @ 0830  Report to [facility] [place].  Call this number if you have problems the morning of surgery: [phone]   Remember:   Do not eat food:After Midnight.  May have clear liquids: up to 4 Hours before arrival.(until 2:30 am)  Clear liquids include soda, tea, black coffee, apple or grape juice, broth,water  Take these medicines the morning of surgery with A SIP OF WATER: Amlodipine,Nadolol,and Gabapentin   Do not wear jewelry  Do not wear lotions, powders, or perfumes.   Do not bring valuables to the hospital.  Contacts, dentures or bridgework may not be worn into surgery.  Leave suitcase in the car. After surgery it may be brought to your room.  For patients admitted to the hospital, checkout time is 11:00 AM the day of discharge.   Special Instructions: Incentive Spirometry - Practice and bring it with you on the day of surgery. and CHG Shower Use Special Wash: 1/2 bottle night before surgery and 1/2 bottle morning of surgery.   Please read over the following fact sheets that you were given: Pain Booklet, Coughing and Deep Breathing, Blood Transfusion Information, Open Heart Packet, MRSA Information and Surgical Site Infection Prevention

## 2011-09-08 LAB — HEMOGLOBIN A1C
Hgb A1c MFr Bld: 7.8 % — ABNORMAL HIGH (ref <5.7)
Mean Plasma Glucose: 177 mg/dL — ABNORMAL HIGH (ref <117)

## 2011-09-08 MED ORDER — VANCOMYCIN HCL 1000 MG IV SOLR
1500.0000 mg | INTRAVENOUS | Status: AC
  Administered 2011-09-09: 1500 mg via INTRAVENOUS
  Filled 2011-09-08: qty 1500

## 2011-09-08 MED ORDER — DEXTROSE 5 % IV SOLN
750.0000 mg | INTRAVENOUS | Status: DC
  Filled 2011-09-08 (×3): qty 750

## 2011-09-08 MED ORDER — SODIUM CHLORIDE 0.9 % IV SOLN
0.1000 ug/kg/h | INTRAVENOUS | Status: AC
  Administered 2011-09-09: .2 ug/kg/h via INTRAVENOUS
  Filled 2011-09-08: qty 4

## 2011-09-08 MED ORDER — DEXTROSE 5 % IV SOLN
1.5000 g | INTRAVENOUS | Status: AC
  Administered 2011-09-09: .75 g via INTRAVENOUS
  Administered 2011-09-09: 1.5 g via INTRAVENOUS
  Filled 2011-09-08: qty 1.5

## 2011-09-08 MED ORDER — TRANEXAMIC ACID 100 MG/ML IV SOLN
1.5000 mg/kg/h | INTRAVENOUS | Status: AC
  Administered 2011-09-09: 1.5 mg/kg/h via INTRAVENOUS
  Filled 2011-09-08: qty 25

## 2011-09-08 MED ORDER — DOPAMINE-DEXTROSE 3.2-5 MG/ML-% IV SOLN
2.0000 ug/kg/min | INTRAVENOUS | Status: DC
  Filled 2011-09-08: qty 250

## 2011-09-08 MED ORDER — INSULIN REGULAR HUMAN 100 UNIT/ML IJ SOLN
INTRAMUSCULAR | Status: AC
  Administered 2011-09-09: 2.9 [IU]/h via INTRAVENOUS
  Administered 2011-09-09: 1 [IU]/h via INTRAVENOUS
  Filled 2011-09-08: qty 1

## 2011-09-08 MED ORDER — PHENYLEPHRINE HCL 10 MG/ML IJ SOLN
30.0000 ug/min | INTRAVENOUS | Status: DC
  Filled 2011-09-08: qty 2

## 2011-09-08 MED ORDER — EPINEPHRINE HCL 1 MG/ML IJ SOLN
0.5000 ug/min | INTRAVENOUS | Status: DC
  Filled 2011-09-08: qty 4

## 2011-09-08 MED ORDER — TRANEXAMIC ACID (OHS) PUMP PRIME SOLUTION
2.0000 mg/kg | INTRAVENOUS | Status: DC
  Filled 2011-09-08 (×2): qty 2.68

## 2011-09-08 MED ORDER — MAGNESIUM SULFATE 50 % IJ SOLN
40.0000 meq | INTRAMUSCULAR | Status: DC
  Filled 2011-09-08: qty 10

## 2011-09-08 MED ORDER — METOPROLOL TARTRATE 12.5 MG HALF TABLET: 12.5000 mg | ORAL_TABLET | Freq: Once | ORAL | Status: DC

## 2011-09-08 MED ORDER — SODIUM BICARBONATE 8.4 % IV SOLN
INTRAVENOUS | Status: AC
  Administered 2011-09-09: 13:00:00
  Filled 2011-09-08: qty 2.5

## 2011-09-08 MED ORDER — TRANEXAMIC ACID (OHS) BOLUS VIA INFUSION
15.0000 mg/kg | INTRAVENOUS | Status: AC
  Administered 2011-09-09: 1338 mg via INTRAVENOUS
  Filled 2011-09-08: qty 2007

## 2011-09-08 MED ORDER — NITROGLYCERIN IN D5W 200-5 MCG/ML-% IV SOLN
2.0000 ug/min | INTRAVENOUS | Status: AC
  Administered 2011-09-09: 5 ug/min via INTRAVENOUS
  Filled 2011-09-08 (×2): qty 250

## 2011-09-08 MED ORDER — POTASSIUM CHLORIDE 2 MEQ/ML IV SOLN
80.0000 meq | INTRAVENOUS | Status: DC
  Filled 2011-09-08: qty 40

## 2011-09-08 NOTE — H&P (Signed)
[address]            [place] [address]          [phone]      PCP is Irven Shelling, MD, MD  Referring Provider is Elam Dutch, MD   Chief Complaint   Patient presents with   .  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 Oneida Alar in November of 2012. His workup also showed that he had a high-grade right internal carotid artery stenosis and a calcified 70% stenosis at the origin of the innominate artery. According to his family he does have much clearer mental status since his left carotid endarterectomy and is otherwise asymptomatic at this time. He underwent cardiac catheterization by Dr. Tamala Julian in preparation for planned right carotid endarterectomy and aorto- innominate bypass. This showed significant three-vessel coronary disease.  Past Medical History   Diagnosis  Date   .  Diabetes mellitus    .  Chest pain    .  Muscle pain    .  Diarrhea    .  Hypertension    .  Hyperlipidemia    .  Hyperlipidemia    .  Atherosclerotic heart disease    .  PVD (peripheral vascular disease)    .  Peripheral neuropathy    .  Seasonal allergies    .  Nasal polyps      hx of   .  Fall      3wks ago and has a bruise under left eye   .  Edema      right leg knee down swollen since fall 3 wks ago   .  Back pain    .  GERD (gastroesophageal reflux disease)      Rolaids as needed-occ reflux   .  Colon polyps    .  Renal insufficiency    .  Urinary frequency      urgency/takes Vesicare daily   .  Kidney stone      35+yrs ago   .  Arthritis    .  Pneumonia      as a child    Past Surgical History   Procedure  Date   .  Aortobifemoral bypass    .  Reimplantation of inferior mesenteric artery    .   Repair of infrarenal abdominal aortic aneurysm    .  Shoulder arthroscopy w/ rotator cuff repair      right   .  Tonsillectomy      at age 67   .  Knee arthroscopy  2011   .  Cataract surgery      left   .  Wisdom teeth extracted      as a teenager   .  Cardiac catheterization  2003   .  Carotid endarterectomy  04/08/2011     left CEA   .  Endarterectomy  04/08/2011     Procedure: ENDARTERECTOMY CAROTID; Surgeon: Elam Dutch, MD; Location: [facility] OR; Service: Vascular; Laterality: Left; with patch angioplasty    Family History   Problem  Relation  Age of Onset   .  Heart disease  Mother    .  Heart disease  Father    .  Anesthesia problems  Neg Hx    .  Hypotension  Neg Hx    .  Malignant hyperthermia  Neg Hx    .  Pseudochol deficiency  Neg Hx    Social History  History   Substance Use Topics   .  Smoking status:  Former Smoker -- 40 years     Types:  Cigarettes     Quit date:  10/27/2005   .  Smokeless tobacco:  Not on file   .  Alcohol Use:  0.0 oz/week     2-3 Cans of beer per week      occasional    Current Outpatient Prescriptions   Medication  Sig  Dispense  Refill   .  amLODipine (NORVASC) 5 MG tablet  Take 5 mg by mouth daily.     Marland Kitchen  aspirin 325 MG tablet  Take 325 mg by mouth daily.     Marland Kitchen  atorvastatin (LIPITOR) 20 MG tablet  Take 20 mg by mouth daily.     Marland Kitchen  b complex vitamins capsule  Take 1 capsule by mouth daily.     .  bisacodyl (DULCOLAX) 10 MG suppository  Place 10 mg rectally as needed.     .  Cetirizine HCl (ZYRTEC ALLERGY PO)  Take 1 tablet by mouth daily as needed. For allergies     .  cilostazol (PLETAL) 100 MG tablet  Take 100 mg by mouth 2 (two) times daily.     Marland Kitchen  doxycycline (VIBRA-TABS) 100 MG tablet  Take 100 mg by mouth daily.     .  fenofibrate (TRICOR) 145 MG tablet  Take 145 mg by mouth daily.     Marland Kitchen  gabapentin (NEURONTIN) 300 MG capsule  Take 300 mg by mouth 2 (two) times daily.     Marland Kitchen  glipiZIDE (GLUCOTROL) 10 MG tablet  Take 10 mg by  mouth daily.     Marland Kitchen  loperamide (IMODIUM) 1 MG/5ML solution  Take by mouth 4 (four) times daily as needed.     .  metFORMIN (GLUCOPHAGE) 500 MG tablet  Take 1,000 mg by mouth 2 (two) times daily with a meal.     .  nadolol (CORGARD) 80 MG tablet  Take 80 mg by mouth daily.     .  nitroGLYCERIN (NITROSTAT) 0.4 MG SL tablet  Place 0.4 mg under the tongue every 5 (five) minutes as needed. For chest pain     .  pioglitazone (ACTOS) 30 MG tablet  Take 15 mg by mouth daily.     .  pseudoephedrine-guaifenesin (MUCINEX D) 60-600 MG per tablet  Take 1 tablet by mouth as needed.     .  ramipril (ALTACE) 10 MG tablet  Take 10 mg by mouth 2 (two) times daily.     .  temazepam (RESTORIL) 15 MG capsule  Take 15 mg by mouth at bedtime as needed. For sleep      Allergies   Allergen  Reactions   .  Augmentin  Nausea Only    Review of Systems:  General: He denies any fever or chills. He does report some weight gain and said that he likes to eat. He denies fatigue.  Eyes: Negative  ENT: Negative  Endocrine: He has adult onset diabetes.  Cardiovascular: He denies any chest pressure or pain. He denies PND and orthopnea. He denies exertional dyspnea  but is not very active due to lower extremity claudication. He denies peripheral edema.  Respiratory: Denies cough and sputum production.  GI: He denies nausea and vomiting. He denies melena and bright red blood per rectum.  GU: Denies dysuria and hematuria. He does have frequent urination.  Neurological: He denies any focal weakness or numbness. He denies dizziness and syncope.  Vascular: He reports claudication in both calves when walking less than one half block. This resolves with resting. He denies any history of DVT, phlebitis, or vein stripping.  Musculoskeletal: He does report some myalgias.    BP 134/61  Pulse 55  Resp 18  Ht 5\' 11"  (1.803 m)  Wt 299 lb (135.626 kg)  BMI 41.70 kg/m2  SpO2 92%  Physical Exam:  He is an obese white male in no  distress.  HEENT: Normocephalic and atraumatic. Pupils are equal and reactive to light. Extraocular muscles are intact. Oropharynx is clear.  Neck: Carotid pulses are palpable bilaterally. There is a right cervical bruit. There is a left carotid endarterectomy scar. There is no adenopathy or thyromegaly.  Heart: Regular rate and rhythm with normal S1 and S2. There is no murmur, rub, or gallop.  Lungs: Clear  Abdomen: Bowel sounds are present. Soft and nontender. Obese. There are no palpable masses or organomegaly.  Extremities: There is no peripheral edema. Pedal pulses are not palpable.  Neurological: Alert and oriented x3. Motor and sensory exams are grossly normal.    Diagnostic Tests:  CT ANGIOGRAPHY NECK  Technique: Multidetector CT imaging of the neck was performed  using the standard protocol during bolus administration of  intravenous contrast. Multiplanar CT image reconstructions  including MIPs were obtained to evaluate the vascular anatomy.  Carotid stenosis measurements (when applicable) are obtained  utilizing NASCET criteria, using the distal internal carotid  diameter as the denominator.  Contrast: 122mL OMNIPAQUE IOHEXOL 350 MG/ML IV SOLN  Comparison: Head and face CT 03/09/2011. Carotid Doppler  ultrasound report 03/19/2011 (no images available).  Findings: Negative visualized lung parenchyma. Negative thyroid,  larynx, pharyngeal mucosal spaces, parapharyngeal spaces,  retropharyngeal space, sublingual space, submandibular glands and  parotid glands. No cervical lymphadenopathy. No superior  mediastinal lymphadenopathy. Degenerative changes in the cervical  spine. No acute osseous abnormality identified. Chronic maxillary  sinus disease with complete opacification of the visualized  sinuses.  Vascular Findings: Calcified atherosclerosis in the aortic arch  affecting the great vessel origins. The no hemodynamically  significant subclavian artery stenosis.  Proximal  right common carotid artery stenosis due to calcified  plaque at the thoracic inlet measures 50 % with respect to the  distal vessel. Beyond this level the right common carotid artery is  only mildly atherosclerotic to the right carotid bifurcation.  At the right carotid bifurcation there is dense calcified plaque  resulting in a radiographic string sign (series 401 image 187).  Calcified plaque continues into the medial right ICA origin and  bulb. Beyond the level of cervical right ICA is tortuous and has a  partially retropharyngeal course. There is calcified plaque in the  posterior right ICA at the skull base and continuing into the right  ICA siphon.  The right vertebral artery origin is heavily calcified with at  least moderate stenosis. Circumferential calcified plaque occurs  in the right V2 segment with severe stenosis (C4-C5 level, series 4  and on image 156). The right vertebral artery remains patent  throughout the neck. There is circumferential calcified plaque  again at its crossing into  the skull base (image 229) and in the  visualized right V4 segment. The right PICA is patent.  No significant stenosis at the left common carotid artery origin or  proximal course.  At the left carotid bifurcation there is dense plaque similar to  that on the right resulting in a radiographic string sign (image  189). Calcified plaque continues into the left ICA origin and  along the posterior bulb and is more elongated but not on the  right. The left ICA remains patent and has a partially  retropharyngeal course. There is calcified plaque at the skull  base and throughout the visualized left ICA siphon similar to that  on the right. The left vertebral artery origin is heavily  calcified and at least moderately stenotic. The left vertebral is  nondominant. There is intermittent calcified atherosclerosis  throughout its course. Is patent to the skull base and  functionally terminates in  the left PICA which is patent.  Review of the MIP images confirms the above findings.  IMPRESSION:  1. Dense, confluent calcified plaque at both carotid bifurcations  resulting in radiographic string signs at both ICA origins.  Calcified plaque continues into the proximal aspects of both ICAs,  worse on the left.  Extensive calcified plaque also in the visualized ICA siphons.  2. Calcified plaque in the proximal right common carotid artery  resulting in 50% stenosis with respect to the distal vessel.  3. Dominant right vertebral artery with moderate origin stenosis  and severe right V2 segment stenosis (C4-C5 level).  4. Nondominant left vertebral artery which functionally  terminating PICA.  Original Report Authenticated By: Randall An, M.D.    PROCEDURE: Left heart catheterization with selective coronary angiography, left ventriculogram.  INDICATIONS: Preop evaluation prior to carotid endarterectomy and thoracic bypass of right innominate artery obstruction. The patient has a history of coronary artery disease with chronic total occlusion of the right coronary and intermediate disease in the LAD dating back many years.  The risks, benefits, and details of the procedure were explained to the patient. The patient verbalized understanding and wanted to proceed. Informed written consent was obtained.  PROCEDURE TECHNIQUE: After Xylocaine anesthesia a 5 French sheath was placed in the left radial artery with a single anterior needle wall stick. Coronary angiography was done using a 5 [place] A2 multipurpose, 5 [place] #4 left Judkins, and the 5 [place] JR 4 catheters. Left ventriculography was done using a 5 [place] A2 multipurpose catheter.  CONTRAST: Total of 125 cc.  COMPLICATIONS: None.  HEMODYNAMICS: Aortic pressure was 162/58 mmHg; LV pressure was 162/8 mmHg; LVEDP 17 mm mercury. There was no gradient between the left ventricle and aorta.  ANGIOGRAPHIC DATA: The left main coronary artery  is widely patent. Moderate calcification is noted..  The left anterior descending artery is transapical. The first septal perforator has 80% ostial narrowing. There is an eccentric heavily calcified proximal LAD stenosis in the 50-70% range..  The left circumflex artery is large vessel that gives collaterals to the distal right coronary via the left atrial recurrent. There is generalized 50% narrowing in the proximal circumflex, 40% narrowing in the ostium of the first obtuse marginal, and 50-60% stenosis in the proximal portion of the second obtuse marginal. The proximal circumflex is heavily calcified..  The right coronary artery is totally occluded proximally. Heavy calcification is noted throughout the remainder of the vessel. The distal vessel fills by collaterals from the left circumflex.  LEFT VENTRICULOGRAM: Left ventricular angiogram was done in  the 30 RAO projection and revealed normal left ventricular wall motion and systolic function with an estimated ejection fraction of 55%  IMPRESSIONS: 1. Significant coronary disease with chronic total occlusion of the right coronary, moderately severe proximal LAD (eccentric 50-70%), and mild to moderate circumflex disease. The right coronary is collateralized from the left circumflex.  2. Normal left ventricular function, LVEF 55%    Impression:   Mr. Woodworth has high-grade right internal carotid artery stenosis and 70% calcified stenosis at the origin of his innominate artery. He requires right carotid endarterectomy and aorto- innominate bypass. He also had significant three-vessel coronary disease and will require coronary bypass graft surgery at the same time. I discussed the operative procedure with the patient and family including alternatives, benefits and risks; including but not limited to bleeding, blood transfusion, infection, stroke, myocardial infarction, graft failure, heart block requiring a permanent pacemaker, organ dysfunction, and  death. Harrison Mons understands and agrees to proceed.   Plan:   Right Carotid Endarterectomy by Dr. Oneida Alar folowed by Aorto-Innominate artery bypass and coronary artery bypass graft surgery on Wednesday, 09/09/2011.

## 2011-09-08 NOTE — Consult Note (Signed)
Anesthesia:  Patient is a 71 year old male scheduled for a CABG, aorta-innominate bypass, and right CEA on 09/09/11.  History includes CAD, former smoker, HLD, PVD, edema, GERD, CRI, HTN, kidney stones, DM2, cataracts, prior history of AFBG for AAA repair and left CEA.  Cardiologist is Dr. Daneen Schick.  PCP is Dr. Lavone Orn.  Cardiac cath from 07/09/11 showed: 1. Significant coronary disease with chronic total occlusion of the right coronary, moderately severe proximal LAD (eccentric 50-70%), and mild to moderate circumflex disease. The right coronary is collateralized from the left circumflex.  2. Normal left ventricular function, LVEF 55%.  PAT RN has requested his last stress test from [facility], but this is still pending.  EKG shows SB, right BBB.  CXR on 09/07/11 showed no acute disease.  Labs noted.  Plan to proceed.

## 2011-09-08 NOTE — Progress Notes (Signed)
Called Dr. Lemmie Evens Smith's office, and spoke with Butch Penny in Medical Records.  She will fax stress test to [phone].

## 2011-09-09 ENCOUNTER — Encounter (HOSPITAL_COMMUNITY)
Admission: RE | Disposition: A | Discharge status: Home Health | Payer: Self-pay | Source: Ambulatory Visit | Attending: Surgery

## 2011-09-09 ENCOUNTER — Inpatient Hospital Stay (HOSPITAL_COMMUNITY)
Admission: RE | Admit: 2011-09-09 | Discharge: 2011-09-29 | DRG: 235 | Disposition: A | Discharge status: Home Health | Payer: Medicare Other | Source: Ambulatory Visit | Attending: Surgery | Admitting: Surgery

## 2011-09-09 ENCOUNTER — Encounter (HOSPITAL_COMMUNITY): Payer: Self-pay | Admitting: Vascular Surgery

## 2011-09-09 ENCOUNTER — Encounter (HOSPITAL_COMMUNITY): Payer: Self-pay | Admitting: *Deleted

## 2011-09-09 ENCOUNTER — Inpatient Hospital Stay (HOSPITAL_COMMUNITY): Payer: Medicare Other

## 2011-09-09 ENCOUNTER — Other Ambulatory Visit (HOSPITAL_COMMUNITY): Payer: Self-pay | Admitting: Radiology

## 2011-09-09 ENCOUNTER — Ambulatory Visit (HOSPITAL_COMMUNITY): Payer: Medicare Other | Admitting: Vascular Surgery

## 2011-09-09 DIAGNOSIS — R5381 Other malaise: Secondary | ICD-10-CM | POA: Diagnosis not present

## 2011-09-09 DIAGNOSIS — Z87891 Personal history of nicotine dependence: Secondary | ICD-10-CM

## 2011-09-09 DIAGNOSIS — E119 Type 2 diabetes mellitus without complications: Secondary | ICD-10-CM | POA: Diagnosis present

## 2011-09-09 DIAGNOSIS — Z951 Presence of aortocoronary bypass graft: Secondary | ICD-10-CM

## 2011-09-09 DIAGNOSIS — I10 Essential (primary) hypertension: Secondary | ICD-10-CM | POA: Diagnosis present

## 2011-09-09 DIAGNOSIS — R0902 Hypoxemia: Secondary | ICD-10-CM | POA: Diagnosis not present

## 2011-09-09 DIAGNOSIS — G9349 Other encephalopathy: Secondary | ICD-10-CM | POA: Diagnosis not present

## 2011-09-09 DIAGNOSIS — L03119 Cellulitis of unspecified part of limb: Secondary | ICD-10-CM | POA: Diagnosis not present

## 2011-09-09 DIAGNOSIS — D72829 Elevated white blood cell count, unspecified: Secondary | ICD-10-CM | POA: Diagnosis not present

## 2011-09-09 DIAGNOSIS — I6529 Occlusion and stenosis of unspecified carotid artery: Secondary | ICD-10-CM | POA: Diagnosis present

## 2011-09-09 DIAGNOSIS — F99 Mental disorder, not otherwise specified: Secondary | ICD-10-CM | POA: Diagnosis not present

## 2011-09-09 DIAGNOSIS — B370 Candidal stomatitis: Secondary | ICD-10-CM | POA: Diagnosis not present

## 2011-09-09 DIAGNOSIS — G609 Hereditary and idiopathic neuropathy, unspecified: Secondary | ICD-10-CM | POA: Diagnosis present

## 2011-09-09 DIAGNOSIS — A498 Other bacterial infections of unspecified site: Secondary | ICD-10-CM | POA: Diagnosis not present

## 2011-09-09 DIAGNOSIS — I771 Stricture of artery: Secondary | ICD-10-CM

## 2011-09-09 DIAGNOSIS — R350 Frequency of micturition: Secondary | ICD-10-CM | POA: Diagnosis present

## 2011-09-09 DIAGNOSIS — E785 Hyperlipidemia, unspecified: Secondary | ICD-10-CM | POA: Diagnosis present

## 2011-09-09 DIAGNOSIS — D62 Acute posthemorrhagic anemia: Secondary | ICD-10-CM | POA: Diagnosis not present

## 2011-09-09 DIAGNOSIS — Z79899 Other long term (current) drug therapy: Secondary | ICD-10-CM

## 2011-09-09 DIAGNOSIS — I4891 Unspecified atrial fibrillation: Secondary | ICD-10-CM | POA: Diagnosis not present

## 2011-09-09 DIAGNOSIS — I708 Atherosclerosis of other arteries: Secondary | ICD-10-CM | POA: Diagnosis present

## 2011-09-09 DIAGNOSIS — I5031 Acute diastolic (congestive) heart failure: Secondary | ICD-10-CM | POA: Diagnosis not present

## 2011-09-09 DIAGNOSIS — I82622 Acute embolism and thrombosis of deep veins of left upper extremity: Secondary | ICD-10-CM

## 2011-09-09 DIAGNOSIS — I2510 Atherosclerotic heart disease of native coronary artery without angina pectoris: Principal | ICD-10-CM | POA: Diagnosis present

## 2011-09-09 DIAGNOSIS — I82629 Acute embolism and thrombosis of deep veins of unspecified upper extremity: Secondary | ICD-10-CM | POA: Diagnosis not present

## 2011-09-09 DIAGNOSIS — I2582 Chronic total occlusion of coronary artery: Secondary | ICD-10-CM | POA: Diagnosis present

## 2011-09-09 DIAGNOSIS — Z9889 Other specified postprocedural states: Secondary | ICD-10-CM

## 2011-09-09 DIAGNOSIS — L02419 Cutaneous abscess of limb, unspecified: Secondary | ICD-10-CM | POA: Diagnosis not present

## 2011-09-09 DIAGNOSIS — Z7982 Long term (current) use of aspirin: Secondary | ICD-10-CM

## 2011-09-09 DIAGNOSIS — N390 Urinary tract infection, site not specified: Secondary | ICD-10-CM | POA: Diagnosis not present

## 2011-09-09 DIAGNOSIS — I700 Atherosclerosis of aorta: Secondary | ICD-10-CM | POA: Diagnosis present

## 2011-09-09 HISTORY — PX: ENDARTERECTOMY: SHX5162

## 2011-09-09 HISTORY — PX: CORONARY ARTERY BYPASS GRAFT: SHX141

## 2011-09-09 LAB — POCT I-STAT 4, (NA,K, GLUC, HGB,HCT)
Glucose, Bld: 156 mg/dL — ABNORMAL HIGH (ref 70–99)
Glucose, Bld: 120 mg/dL — ABNORMAL HIGH (ref 70–99)
Glucose, Bld: 102 mg/dL — ABNORMAL HIGH (ref 70–99)
Glucose, Bld: 146 mg/dL — ABNORMAL HIGH (ref 70–99)
Glucose, Bld: 165 mg/dL — ABNORMAL HIGH (ref 70–99)
Glucose, Bld: 153 mg/dL — ABNORMAL HIGH (ref 70–99)
Glucose, Bld: 175 mg/dL — ABNORMAL HIGH (ref 70–99)
Glucose, Bld: 155 mg/dL — ABNORMAL HIGH (ref 70–99)
Glucose, Bld: 149 mg/dL — ABNORMAL HIGH (ref 70–99)
HCT: 38 % — ABNORMAL LOW (ref 39.0–52.0)
HCT: 26 % — ABNORMAL LOW (ref 39.0–52.0)
HCT: 20 % — ABNORMAL LOW (ref 39.0–52.0)
HCT: 29 % — ABNORMAL LOW (ref 39.0–52.0)
HCT: 28 % — ABNORMAL LOW (ref 39.0–52.0)
Hemoglobin: 12.9 g/dL — ABNORMAL LOW (ref 13.0–17.0)
Hemoglobin: 8.8 g/dL — ABNORMAL LOW (ref 13.0–17.0)
Hemoglobin: 6.8 g/dL — CL (ref 13.0–17.0)
Hemoglobin: 6.8 g/dL — CL (ref 13.0–17.0)
Hemoglobin: 6.5 g/dL — CL (ref 13.0–17.0)
Hemoglobin: 9.5 g/dL — ABNORMAL LOW (ref 13.0–17.0)
Potassium: 4.6 mEq/L (ref 3.5–5.1)
Potassium: 4.8 mEq/L (ref 3.5–5.1)
Potassium: 5.8 mEq/L — ABNORMAL HIGH (ref 3.5–5.1)
Potassium: 6.3 mEq/L (ref 3.5–5.1)
Potassium: 4.9 mEq/L (ref 3.5–5.1)
Potassium: 4.3 mEq/L (ref 3.5–5.1)
Potassium: 4.1 mEq/L (ref 3.5–5.1)
Sodium: 132 mEq/L — ABNORMAL LOW (ref 135–145)
Sodium: 135 mEq/L (ref 135–145)
Sodium: 137 mEq/L (ref 135–145)
Sodium: 140 mEq/L (ref 135–145)

## 2011-09-09 LAB — CBC
HCT: 26.4 % — ABNORMAL LOW (ref 39.0–52.0)
HCT: 27.2 % — ABNORMAL LOW (ref 39.0–52.0)
Hemoglobin: 9.4 g/dL — ABNORMAL LOW (ref 13.0–17.0)
MCH: 30.6 pg (ref 26.0–34.0)
MCH: 30.7 pg (ref 26.0–34.0)
MCHC: 34.6 g/dL (ref 30.0–36.0)
MCV: 87.7 fL (ref 78.0–100.0)
MCV: 88.9 fL (ref 78.0–100.0)
Platelets: 134 10*3/uL — ABNORMAL LOW (ref 150–400)
RBC: 3.01 MIL/uL — ABNORMAL LOW (ref 4.22–5.81)
RDW: 16.2 % — ABNORMAL HIGH (ref 11.5–15.5)
WBC: 11.1 10*3/uL — ABNORMAL HIGH (ref 4.0–10.5)

## 2011-09-09 LAB — BLOOD GAS, ARTERIAL
Bicarbonate: 22.1 mEq/L (ref 20.0–24.0)
Bicarbonate: 22.9 mEq/L (ref 20.0–24.0)
Drawn by: 242311
FIO2: 0.21 %
FIO2: 0.21 %
O2 Saturation: 95.1 %
TCO2: 23.1 mmol/L (ref 0–100)
pCO2 arterial: 34.1 mmHg — ABNORMAL LOW (ref 35.0–45.0)
pH, Arterial: 7.433 (ref 7.350–7.450)
pO2, Arterial: 73.6 mmHg — ABNORMAL LOW (ref 80.0–100.0)
pO2, Arterial: 69.3 mmHg — ABNORMAL LOW (ref 80.0–100.0)

## 2011-09-09 LAB — POCT I-STAT 3, ART BLOOD GAS (G3+)
Acid-base deficit: 3 mmol/L — ABNORMAL HIGH (ref 0.0–2.0)
Acid-base deficit: 3 mmol/L — ABNORMAL HIGH (ref 0.0–2.0)
Bicarbonate: 23.6 mEq/L (ref 20.0–24.0)
Bicarbonate: 23.6 mEq/L (ref 20.0–24.0)
Bicarbonate: 22.8 mEq/L (ref 20.0–24.0)
Bicarbonate: 25.2 mEq/L — ABNORMAL HIGH (ref 20.0–24.0)
O2 Saturation: 94 %
Patient temperature: 36.9
TCO2: 24 mmol/L (ref 0–100)
TCO2: 27 mmol/L (ref 0–100)
pCO2 arterial: 47.3 mmHg — ABNORMAL HIGH (ref 35.0–45.0)
pCO2 arterial: 36.7 mmHg (ref 35.0–45.0)
pH, Arterial: 7.288 — ABNORMAL LOW (ref 7.350–7.450)
pH, Arterial: 7.305 — ABNORMAL LOW (ref 7.350–7.450)
pH, Arterial: 7.401 (ref 7.350–7.450)
pH, Arterial: 7.226 — ABNORMAL LOW (ref 7.350–7.450)
pO2, Arterial: 348 mmHg — ABNORMAL HIGH (ref 80.0–100.0)
pO2, Arterial: 73 mmHg — ABNORMAL LOW (ref 80.0–100.0)

## 2011-09-09 LAB — PLATELET COUNT: Platelets: 132 10*3/uL — ABNORMAL LOW (ref 150–400)

## 2011-09-09 LAB — POCT I-STAT GLUCOSE
Glucose, Bld: 124 mg/dL — ABNORMAL HIGH (ref 70–99)
Operator id: 178832

## 2011-09-09 LAB — GLUCOSE, CAPILLARY: Glucose-Capillary: 172 mg/dL — ABNORMAL HIGH (ref 70–99)

## 2011-09-09 LAB — HEMOGLOBIN AND HEMATOCRIT, BLOOD: Hemoglobin: 8.9 g/dL — ABNORMAL LOW (ref 13.0–17.0)

## 2011-09-09 LAB — APTT: aPTT: 41 seconds — ABNORMAL HIGH (ref 24–37)

## 2011-09-09 SURGERY — CORONARY ARTERY BYPASS GRAFTING (CABG)
Anesthesia: General | Site: Neck | Laterality: Right | Wound class: Clean

## 2011-09-09 MED ORDER — PANTOPRAZOLE SODIUM 40 MG PO TBEC
40.0000 mg | DELAYED_RELEASE_TABLET | Freq: Every day | ORAL | Status: DC
  Administered 2011-09-13 – 2011-09-14 (×2): 40 mg via ORAL
  Filled 2011-09-09 (×2): qty 1

## 2011-09-09 MED ORDER — INSULIN REGULAR BOLUS VIA INFUSION
0.0000 [IU] | Freq: Three times a day (TID) | INTRAVENOUS | Status: DC
Start: 2011-09-10 — End: 2011-09-14
  Filled 2011-09-09: qty 10

## 2011-09-09 MED ORDER — PHENYLEPHRINE HCL 10 MG/ML IJ SOLN
0.0000 ug/min | INTRAVENOUS | Status: DC
  Filled 2011-09-09: qty 2

## 2011-09-09 MED ORDER — DEXTROSE 5 % IV SOLN
1.5000 g | Freq: Two times a day (BID) | INTRAVENOUS | Status: AC
  Administered 2011-09-09 – 2011-09-11 (×4): 1.5 g via INTRAVENOUS
  Filled 2011-09-09 (×4): qty 1.5

## 2011-09-09 MED ORDER — BISACODYL 10 MG RE SUPP
10.0000 mg | Freq: Every day | RECTAL | Status: DC
  Administered 2011-09-11: 10 mg via RECTAL
  Filled 2011-09-09: qty 1

## 2011-09-09 MED ORDER — PROTAMINE SULFATE 10 MG/ML IV SOLN
INTRAVENOUS | Status: DC | PRN
  Administered 2011-09-09: 30 mg via INTRAVENOUS
  Administered 2011-09-09 (×2): 20 mg via INTRAVENOUS
  Administered 2011-09-09: 25 mg via INTRAVENOUS
  Administered 2011-09-09 (×2): 30 mg via INTRAVENOUS
  Administered 2011-09-09: 25 mg via INTRAVENOUS
  Administered 2011-09-09 (×8): 20 mg via INTRAVENOUS
  Administered 2011-09-09: 30 mg via INTRAVENOUS

## 2011-09-09 MED ORDER — THROMBIN 20000 UNITS EX SOLR
CUTANEOUS | Status: DC | PRN
  Administered 2011-09-09: 20000 [IU] via TOPICAL

## 2011-09-09 MED ORDER — FENOFIBRATE 160 MG PO TABS
160.0000 mg | ORAL_TABLET | Freq: Every day | ORAL | Status: DC
  Administered 2011-09-11 – 2011-09-29 (×18): 160 mg via ORAL
  Filled 2011-09-09 (×21): qty 1

## 2011-09-09 MED ORDER — DOPAMINE-DEXTROSE 3.2-5 MG/ML-% IV SOLN
INTRAVENOUS | Status: DC | PRN
  Administered 2011-09-09: 3 ug/kg/min via INTRAVENOUS

## 2011-09-09 MED ORDER — MORPHINE SULFATE 2 MG/ML IJ SOLN
1.0000 mg | INTRAMUSCULAR | Status: AC | PRN
  Administered 2011-09-10: 2 mg via INTRAVENOUS
  Filled 2011-09-09: qty 1

## 2011-09-09 MED ORDER — SODIUM CHLORIDE 0.45 % IV SOLN: INTRAVENOUS | Status: DC

## 2011-09-09 MED ORDER — VANCOMYCIN HCL IN DEXTROSE 1-5 GM/200ML-% IV SOLN
1000.0000 mg | Freq: Once | INTRAVENOUS | Status: AC
  Administered 2011-09-10: 1000 mg via INTRAVENOUS
  Filled 2011-09-09: qty 200

## 2011-09-09 MED ORDER — VECURONIUM BROMIDE 10 MG IV SOLR
INTRAVENOUS | Status: DC | PRN
  Administered 2011-09-09 (×2): 10 mg via INTRAVENOUS

## 2011-09-09 MED ORDER — LACTATED RINGERS IV SOLN
INTRAVENOUS | Status: DC | PRN
  Administered 2011-09-09 (×4): via INTRAVENOUS

## 2011-09-09 MED ORDER — GABAPENTIN 300 MG PO CAPS
300.0000 mg | ORAL_CAPSULE | Freq: Two times a day (BID) | ORAL | Status: DC
  Administered 2011-09-11 – 2011-09-29 (×36): 300 mg via ORAL
  Filled 2011-09-09 (×42): qty 1

## 2011-09-09 MED ORDER — METOPROLOL TARTRATE 25 MG/10 ML ORAL SUSPENSION
12.5000 mg | Freq: Two times a day (BID) | ORAL | Status: DC
  Filled 2011-09-09 (×3): qty 5

## 2011-09-09 MED ORDER — ALBUMIN HUMAN 5 % IV SOLN
250.0000 mL | INTRAVENOUS | Status: AC | PRN
  Administered 2011-09-09 – 2011-09-10 (×3): 250 mL via INTRAVENOUS
  Filled 2011-09-09: qty 250

## 2011-09-09 MED ORDER — FENTANYL CITRATE 0.05 MG/ML IJ SOLN
INTRAMUSCULAR | Status: DC | PRN
  Administered 2011-09-09 (×2): 50 ug via INTRAVENOUS
  Administered 2011-09-09 (×2): 100 ug via INTRAVENOUS
  Administered 2011-09-09: 50 ug via INTRAVENOUS
  Administered 2011-09-09 (×2): 150 ug via INTRAVENOUS
  Administered 2011-09-09: 100 ug via INTRAVENOUS
  Administered 2011-09-09: 200 ug via INTRAVENOUS
  Administered 2011-09-09 (×2): 100 ug via INTRAVENOUS
  Administered 2011-09-09: 50 ug via INTRAVENOUS
  Administered 2011-09-09: 250 ug via INTRAVENOUS
  Administered 2011-09-09: 150 ug via INTRAVENOUS

## 2011-09-09 MED ORDER — MIDAZOLAM HCL 2 MG/2ML IJ SOLN
2.0000 mg | INTRAMUSCULAR | Status: DC | PRN
  Administered 2011-09-10 – 2011-09-11 (×4): 2 mg via INTRAVENOUS
  Filled 2011-09-09 (×4): qty 2

## 2011-09-09 MED ORDER — ACETAMINOPHEN 160 MG/5ML PO SOLN: 650.0000 mg | ORAL | Status: AC

## 2011-09-09 MED ORDER — 0.9 % SODIUM CHLORIDE (POUR BTL) OPTIME
TOPICAL | Status: DC | PRN
  Administered 2011-09-09: 2000 mL
  Administered 2011-09-09: 6000 mL

## 2011-09-09 MED ORDER — SODIUM CHLORIDE 0.9 % IJ SOLN: 3.0000 mL | INTRAMUSCULAR | Status: DC | PRN

## 2011-09-09 MED ORDER — MIDAZOLAM HCL 5 MG/5ML IJ SOLN
INTRAMUSCULAR | Status: DC | PRN
  Administered 2011-09-09 (×2): 2 mg via INTRAVENOUS
  Administered 2011-09-09: 1 mg via INTRAVENOUS
  Administered 2011-09-09: 2 mg via INTRAVENOUS
  Administered 2011-09-09: 1 mg via INTRAVENOUS
  Administered 2011-09-09 (×2): 2 mg via INTRAVENOUS

## 2011-09-09 MED ORDER — SODIUM CHLORIDE 0.9 % IV SOLN: 250.0000 mL | INTRAVENOUS | Status: DC

## 2011-09-09 MED ORDER — NITROGLYCERIN IN D5W 200-5 MCG/ML-% IV SOLN: 0.0000 ug/min | INTRAVENOUS | Status: DC

## 2011-09-09 MED ORDER — ASPIRIN EC 325 MG PO TBEC
325.0000 mg | DELAYED_RELEASE_TABLET | Freq: Every day | ORAL | Status: DC
  Administered 2011-09-12 – 2011-09-14 (×3): 325 mg via ORAL
  Filled 2011-09-09 (×5): qty 1

## 2011-09-09 MED ORDER — SODIUM CHLORIDE 0.9 % IJ SOLN
OROMUCOSAL | Status: DC | PRN
  Administered 2011-09-09: 18:00:00 via TOPICAL

## 2011-09-09 MED ORDER — HEMOSTATIC AGENTS (NO CHARGE) OPTIME
TOPICAL | Status: DC | PRN
  Administered 2011-09-09: 1 via TOPICAL

## 2011-09-09 MED ORDER — NOREPINEPHRINE BITARTRATE 1 MG/ML IJ SOLN
4000.0000 ug | INTRAVENOUS | Status: DC | PRN
  Administered 2011-09-09: 4 ug/min via INTRAVENOUS

## 2011-09-09 MED ORDER — ACETAMINOPHEN 160 MG/5ML PO SOLN
975.0000 mg | Freq: Four times a day (QID) | ORAL | Status: DC
  Administered 2011-09-10 – 2011-09-11 (×6): 975 mg
  Filled 2011-09-09 (×3): qty 40.6
  Filled 2011-09-09: qty 20.3
  Filled 2011-09-09: qty 40.6

## 2011-09-09 MED ORDER — LACTATED RINGERS IV SOLN: INTRAVENOUS | Status: DC

## 2011-09-09 MED ORDER — THROMBIN 20000 UNITS EX KIT
PACK | CUTANEOUS | Status: DC | PRN
  Administered 2011-09-09: 12:00:00 via TOPICAL

## 2011-09-09 MED ORDER — BISACODYL 5 MG PO TBEC
10.0000 mg | DELAYED_RELEASE_TABLET | Freq: Every day | ORAL | Status: DC
  Administered 2011-09-12 – 2011-09-14 (×2): 10 mg via ORAL
  Filled 2011-09-09: qty 1
  Filled 2011-09-09 (×2): qty 2

## 2011-09-09 MED ORDER — PHENYLEPHRINE HCL 10 MG/ML IJ SOLN
20.0000 mg | INTRAVENOUS | Status: DC | PRN
  Administered 2011-09-09: 13.33 ug/min via INTRAVENOUS

## 2011-09-09 MED ORDER — EPHEDRINE SULFATE 50 MG/ML IJ SOLN
INTRAMUSCULAR | Status: DC | PRN
  Administered 2011-09-09: 10 mg via INTRAVENOUS

## 2011-09-09 MED ORDER — SODIUM CHLORIDE 0.9 % IV SOLN
0.4000 ug/kg/h | INTRAVENOUS | Status: DC
  Filled 2011-09-09: qty 4

## 2011-09-09 MED ORDER — LACTATED RINGERS IV SOLN: 500.0000 mL | Freq: Once | INTRAVENOUS | Status: AC | PRN

## 2011-09-09 MED ORDER — LIDOCAINE HCL (CARDIAC) 20 MG/ML IV SOLN
INTRAVENOUS | Status: DC | PRN
  Administered 2011-09-09: 60 mg via INTRAVENOUS

## 2011-09-09 MED ORDER — ASPIRIN 81 MG PO CHEW
324.0000 mg | CHEWABLE_TABLET | Freq: Every day | ORAL | Status: DC
  Administered 2011-09-11: 324 mg
  Filled 2011-09-09: qty 4

## 2011-09-09 MED ORDER — SODIUM CHLORIDE 0.9 % IV SOLN
INTRAVENOUS | Status: DC
  Administered 2011-09-10: 5.6 [IU]/h via INTRAVENOUS
  Filled 2011-09-09 (×2): qty 1

## 2011-09-09 MED ORDER — PROPOFOL 10 MG/ML IV EMUL
INTRAVENOUS | Status: DC | PRN
  Administered 2011-09-09: 100 mg via INTRAVENOUS

## 2011-09-09 MED ORDER — METOPROLOL TARTRATE 12.5 MG HALF TABLET
12.5000 mg | ORAL_TABLET | Freq: Two times a day (BID) | ORAL | Status: DC
  Filled 2011-09-09 (×3): qty 1

## 2011-09-09 MED ORDER — NOREPINEPHRINE BITARTRATE 1 MG/ML IJ SOLN
2.0000 ug/min | INTRAVENOUS | Status: AC
  Administered 2011-09-10: 7 ug/min via INTRAVENOUS
  Filled 2011-09-09: qty 4

## 2011-09-09 MED ORDER — THROMBIN 20000 UNITS EX KIT
PACK | OROMUCOSAL | Status: DC | PRN
  Administered 2011-09-09: 11:00:00 via TOPICAL

## 2011-09-09 MED ORDER — ACETAMINOPHEN 650 MG RE SUPP
650.0000 mg | RECTAL | Status: AC
  Administered 2011-09-09: 650 mg via RECTAL

## 2011-09-09 MED ORDER — POTASSIUM CHLORIDE 10 MEQ/50ML IV SOLN: 10.0000 meq | INTRAVENOUS | Status: AC

## 2011-09-09 MED ORDER — OXYCODONE HCL 5 MG PO TABS
5.0000 mg | ORAL_TABLET | ORAL | Status: DC | PRN
  Administered 2011-09-11 – 2011-09-14 (×6): 10 mg via ORAL
  Filled 2011-09-09 (×6): qty 2

## 2011-09-09 MED ORDER — HEPARIN SODIUM (PORCINE) 1000 UNIT/ML IJ SOLN
INTRAMUSCULAR | Status: DC | PRN
  Administered 2011-09-09: 15000 [IU] via INTRAVENOUS
  Administered 2011-09-09: 36000 [IU] via INTRAVENOUS

## 2011-09-09 MED ORDER — DOCUSATE SODIUM 100 MG PO CAPS
200.0000 mg | ORAL_CAPSULE | Freq: Every day | ORAL | Status: DC
  Administered 2011-09-11 – 2011-09-14 (×3): 200 mg via ORAL
  Filled 2011-09-09 (×2): qty 2
  Filled 2011-09-09: qty 1
  Filled 2011-09-09: qty 2

## 2011-09-09 MED ORDER — METOPROLOL TARTRATE 1 MG/ML IV SOLN: 2.5000 mg | INTRAVENOUS | Status: DC | PRN

## 2011-09-09 MED ORDER — ACETAMINOPHEN 500 MG PO TABS
1000.0000 mg | ORAL_TABLET | Freq: Four times a day (QID) | ORAL | Status: DC
  Administered 2011-09-11 – 2011-09-14 (×8): 1000 mg via ORAL
  Filled 2011-09-09 (×18): qty 2

## 2011-09-09 MED ORDER — CALCIUM CHLORIDE 10 % IV SOLN
INTRAVENOUS | Status: DC | PRN
  Administered 2011-09-09: 1 g via INTRAVENOUS

## 2011-09-09 MED ORDER — DOPAMINE-DEXTROSE 3.2-5 MG/ML-% IV SOLN
0.0000 ug/kg/min | INTRAVENOUS | Status: DC
  Administered 2011-09-10: 5 ug/kg/min via INTRAVENOUS
  Administered 2011-09-11: 3 ug/kg/min via INTRAVENOUS
  Filled 2011-09-09 (×2): qty 250

## 2011-09-09 MED ORDER — MAGNESIUM SULFATE 40 MG/ML IJ SOLN
4.0000 g | Freq: Once | INTRAMUSCULAR | Status: AC
  Administered 2011-09-09: 4 g via INTRAVENOUS
  Filled 2011-09-09: qty 100

## 2011-09-09 MED ORDER — SODIUM CHLORIDE 0.9 % IV SOLN: INTRAVENOUS | Status: DC

## 2011-09-09 MED ORDER — FUROSEMIDE 10 MG/ML IJ SOLN
40.0000 mg | Freq: Once | INTRAMUSCULAR | Status: AC
  Administered 2011-09-09: 40 mg via INTRAVENOUS
  Filled 2011-09-09: qty 4

## 2011-09-09 MED ORDER — MORPHINE SULFATE 4 MG/ML IJ SOLN
2.0000 mg | INTRAMUSCULAR | Status: DC | PRN
  Administered 2011-09-10 – 2011-09-11 (×4): 4 mg via INTRAVENOUS
  Filled 2011-09-09 (×4): qty 1

## 2011-09-09 MED ORDER — FAMOTIDINE IN NACL 20-0.9 MG/50ML-% IV SOLN
20.0000 mg | Freq: Two times a day (BID) | INTRAVENOUS | Status: AC
  Administered 2011-09-09 – 2011-09-10 (×2): 20 mg via INTRAVENOUS
  Filled 2011-09-09: qty 50

## 2011-09-09 MED ORDER — ONDANSETRON HCL 4 MG/2ML IJ SOLN
4.0000 mg | Freq: Four times a day (QID) | INTRAMUSCULAR | Status: DC | PRN
  Administered 2011-09-11: 4 mg via INTRAVENOUS
  Filled 2011-09-09 (×3): qty 2

## 2011-09-09 MED ORDER — SIMVASTATIN 40 MG PO TABS
40.0000 mg | ORAL_TABLET | Freq: Every day | ORAL | Status: DC
  Administered 2011-09-11: 40 mg via ORAL
  Filled 2011-09-09 (×2): qty 1

## 2011-09-09 MED ORDER — ROCURONIUM BROMIDE 100 MG/10ML IV SOLN
INTRAVENOUS | Status: DC | PRN
  Administered 2011-09-09 (×3): 50 mg via INTRAVENOUS

## 2011-09-09 MED ORDER — SODIUM CHLORIDE 0.9 % IJ SOLN
3.0000 mL | Freq: Two times a day (BID) | INTRAMUSCULAR | Status: DC
  Administered 2011-09-10 – 2011-09-13 (×8): 3 mL via INTRAVENOUS

## 2011-09-09 MED ORDER — ALBUMIN HUMAN 5 % IV SOLN
INTRAVENOUS | Status: DC | PRN
  Administered 2011-09-09 (×2): via INTRAVENOUS

## 2011-09-09 MED ORDER — TRANEXAMIC ACID 100 MG/ML IV SOLN
1.5000 mg/kg/h | INTRAVENOUS | Status: DC
  Filled 2011-09-09: qty 25

## 2011-09-09 MED ORDER — SODIUM CHLORIDE 0.9 % IR SOLN
Status: DC | PRN
  Administered 2011-09-09: 11:00:00

## 2011-09-09 MED ORDER — SODIUM CHLORIDE 0.9 % IV SOLN
0.1000 ug/kg/h | INTRAVENOUS | Status: DC
  Administered 2011-09-09: 0.6 ug/kg/h via INTRAVENOUS
  Administered 2011-09-10: 0.2 ug/kg/h via INTRAVENOUS
  Administered 2011-09-10: 0.5 ug/kg/h via INTRAVENOUS
  Filled 2011-09-09 (×8): qty 2

## 2011-09-09 SURGICAL SUPPLY — 160 items
ADH SKN CLS APL DERMABOND .7 (GAUZE/BANDAGES/DRESSINGS) ×4
APL SKNCLS STERI-STRIP NONHPOA (GAUZE/BANDAGES/DRESSINGS) ×4
ATTRACTOMAT 16X20 MAGNETIC DRP (DRAPES) ×5 IMPLANT
BAG DECANTER FOR FLEXI CONT (MISCELLANEOUS) ×5 IMPLANT
BANDAGE ELASTIC 4 VELCRO ST LF (GAUZE/BANDAGES/DRESSINGS) ×3 IMPLANT
BANDAGE ELASTIC 6 VELCRO ST LF (GAUZE/BANDAGES/DRESSINGS) ×7 IMPLANT
BANDAGE GAUZE ELAST BULKY 4 IN (GAUZE/BANDAGES/DRESSINGS) ×5 IMPLANT
BASKET HEART (ORDER IN 25'S) (MISCELLANEOUS) ×1
BASKET HEART (ORDER IN 25S) (MISCELLANEOUS) ×4 IMPLANT
BENZOIN TINCTURE PRP APPL 2/3 (GAUZE/BANDAGES/DRESSINGS) ×2 IMPLANT
BLADE STERNUM SYSTEM 6 (BLADE) ×5 IMPLANT
BLADE SURG 11 STRL SS (BLADE) ×2 IMPLANT
BOOT SUTURE AID YELLOW STND (SUTURE) ×2 IMPLANT
CANISTER SUCTION 2500CC (MISCELLANEOUS) ×10 IMPLANT
CANNULA ARTERIAL NVNT 3/8 22FR (MISCELLANEOUS) ×2 IMPLANT
CANNULA VESSEL W/WING WO/VALVE (CANNULA) ×7 IMPLANT
CATH ROBINSON RED A/P 18FR (CATHETERS) ×15 IMPLANT
CATH THORACIC 28FR (CATHETERS) ×5 IMPLANT
CATH THORACIC 28FR RT ANG (CATHETERS) IMPLANT
CATH THORACIC 36FR (CATHETERS) ×5 IMPLANT
CATH THORACIC 36FR RT ANG (CATHETERS) ×5 IMPLANT
CLIP TI MEDIUM 24 (CLIP) ×9 IMPLANT
CLIP TI WIDE RED SMALL 24 (CLIP) ×9 IMPLANT
CLIP TI WIDE RED SMALL 6 (CLIP) ×8 IMPLANT
CLOTH BEACON ORANGE TIMEOUT ST (SAFETY) ×10 IMPLANT
CLSR STERI-STRIP ANTIMIC 1/2X4 (GAUZE/BANDAGES/DRESSINGS) ×2 IMPLANT
CONN ST 1/4X3/8  BEN (MISCELLANEOUS)
CONN ST 1/4X3/8 BEN (MISCELLANEOUS) IMPLANT
CONN Y 3/8X3/8X3/8  BEN (MISCELLANEOUS)
CONN Y 3/8X3/8X3/8 BEN (MISCELLANEOUS) IMPLANT
COVER MAYO STAND STRL (DRAPES) ×2 IMPLANT
COVER SURGICAL LIGHT HANDLE (MISCELLANEOUS) ×20 IMPLANT
CRADLE DONUT ADULT HEAD (MISCELLANEOUS) ×10 IMPLANT
DECANTER SPIKE VIAL GLASS SM (MISCELLANEOUS) IMPLANT
DERMABOND ADVANCED (GAUZE/BANDAGES/DRESSINGS) ×1
DERMABOND ADVANCED .7 DNX12 (GAUZE/BANDAGES/DRESSINGS) ×4 IMPLANT
DRAIN CHANNEL 10F 3/8 F FF (DRAIN) ×4 IMPLANT
DRAIN CHANNEL 32F RND 10.7 FF (WOUND CARE) IMPLANT
DRAIN HEMOVAC 1/8 X 5 (WOUND CARE) IMPLANT
DRAPE CARDIOVASCULAR INCISE (DRAPES) ×5
DRAPE INCISE IOBAN 66X45 STRL (DRAPES) ×12 IMPLANT
DRAPE SLUSH MACHINE 52X66 (DRAPES) IMPLANT
DRAPE SLUSH/WARMER DISC (DRAPES) IMPLANT
DRAPE SRG 135X102X78XABS (DRAPES) ×4 IMPLANT
DRAPE WARM FLUID 44X44 (DRAPE) ×5 IMPLANT
DRSG COVADERM 4X14 (GAUZE/BANDAGES/DRESSINGS) ×5 IMPLANT
DRSG COVADERM 4X8 (GAUZE/BANDAGES/DRESSINGS) ×2 IMPLANT
ELECT CAUTERY BLADE 6.4 (BLADE) ×7 IMPLANT
ELECT REM PT RETURN 9FT ADLT (ELECTROSURGICAL) ×15
ELECTRODE REM PT RTRN 9FT ADLT (ELECTROSURGICAL) ×12 IMPLANT
EVACUATOR SILICONE 100CC (DRAIN) ×6 IMPLANT
GAUZE SPONGE 4X4 16PLY XRAY LF (GAUZE/BANDAGES/DRESSINGS) ×2 IMPLANT
GEL ULTRASOUND 20GR AQUASONIC (MISCELLANEOUS) IMPLANT
GLOVE BIO SURGEON STRL SZ 6 (GLOVE) IMPLANT
GLOVE BIO SURGEON STRL SZ 6.5 (GLOVE) ×10 IMPLANT
GLOVE BIO SURGEON STRL SZ7 (GLOVE) IMPLANT
GLOVE BIO SURGEON STRL SZ7.5 (GLOVE) ×7 IMPLANT
GLOVE BIOGEL PI IND STRL 6 (GLOVE) ×5 IMPLANT
GLOVE BIOGEL PI IND STRL 6.5 (GLOVE) IMPLANT
GLOVE BIOGEL PI IND STRL 7.0 (GLOVE) ×1 IMPLANT
GLOVE BIOGEL PI INDICATOR 6 (GLOVE) ×5
GLOVE BIOGEL PI INDICATOR 6.5 (GLOVE)
GLOVE BIOGEL PI INDICATOR 7.0 (GLOVE) ×1
GLOVE EUDERMIC 7 POWDERFREE (GLOVE) ×10 IMPLANT
GLOVE ORTHO TXT STRL SZ7.5 (GLOVE) IMPLANT
GLOVE SS BIOGEL STRL SZ 7 (GLOVE) ×1 IMPLANT
GLOVE SUPERSENSE BIOGEL SZ 7 (GLOVE) ×1
GOWN PREVENTION PLUS XLARGE (GOWN DISPOSABLE) ×10 IMPLANT
GOWN STRL NON-REIN LRG LVL3 (GOWN DISPOSABLE) ×35 IMPLANT
GRAFT HEMASHIELD 12MM (Vascular Products) ×5 IMPLANT
GRAFT VASC STRG 30X12KNIT (Vascular Products) ×1 IMPLANT
HEMOSTAT POWDER SURGIFOAM 1G (HEMOSTASIS) ×20 IMPLANT
HEMOSTAT SURGICEL 2X14 (HEMOSTASIS) ×5 IMPLANT
INSERT FOGARTY 61MM (MISCELLANEOUS) IMPLANT
INSERT FOGARTY XLG (MISCELLANEOUS) IMPLANT
KIT BASIN OR (CUSTOM PROCEDURE TRAY) ×10 IMPLANT
KIT CATH CPB BARTLE (MISCELLANEOUS) ×5 IMPLANT
KIT ROOM TURNOVER OR (KITS) ×10 IMPLANT
KIT SUCTION CATH 14FR (SUCTIONS) ×5 IMPLANT
KIT VASOVIEW W/TROCAR VH 2000 (KITS) ×5 IMPLANT
LOOP VESSEL MAXI BLUE (MISCELLANEOUS) ×6 IMPLANT
LOOP VESSEL MINI RED (MISCELLANEOUS) ×4 IMPLANT
NDL 18GX1X1/2 (RX/OR ONLY) (NEEDLE) IMPLANT
NDL HYPO 25GX1X1/2 BEV (NEEDLE) IMPLANT
NEEDLE 18GX1X1/2 (RX/OR ONLY) (NEEDLE) ×5 IMPLANT
NEEDLE 27GAX1X1/2 (NEEDLE) ×2 IMPLANT
NEEDLE HYPO 25GX1X1/2 BEV (NEEDLE) IMPLANT
NS IRRIG 1000ML POUR BTL (IV SOLUTION) ×39 IMPLANT
PACK AORTA (CUSTOM PROCEDURE TRAY) ×5 IMPLANT
PACK CAROTID (CUSTOM PROCEDURE TRAY) ×5 IMPLANT
PACK OPEN HEART (CUSTOM PROCEDURE TRAY) ×5 IMPLANT
PAD ARMBOARD 7.5X6 YLW CONV (MISCELLANEOUS) ×20 IMPLANT
PATCH HEMASHIELD 8X75 (Vascular Products) ×2 IMPLANT
PENCIL BUTTON HOLSTER BLD 10FT (ELECTRODE) ×5 IMPLANT
PUNCH AORTIC ROTATE 4.0MM (MISCELLANEOUS) IMPLANT
PUNCH AORTIC ROTATE 4.5MM 8IN (MISCELLANEOUS) ×5 IMPLANT
PUNCH AORTIC ROTATE 5MM 8IN (MISCELLANEOUS) IMPLANT
SEALANT SURG COSEAL 8ML (VASCULAR PRODUCTS) ×2 IMPLANT
SET CARDIOPLEGIA MPS 5001102 (MISCELLANEOUS) ×2 IMPLANT
SHUNT CAROTID BYPASS 10 (VASCULAR PRODUCTS) ×2 IMPLANT
SHUNT CAROTID BYPASS 12FRX15.5 (VASCULAR PRODUCTS) IMPLANT
SPECIMEN JAR SMALL (MISCELLANEOUS) ×5 IMPLANT
SPONGE GAUZE 4X4 12PLY (GAUZE/BANDAGES/DRESSINGS) ×12 IMPLANT
SPONGE INTESTINAL PEANUT (DISPOSABLE) IMPLANT
SPONGE LAP 18X18 X RAY DECT (DISPOSABLE) ×18 IMPLANT
SPONGE LAP 4X18 X RAY DECT (DISPOSABLE) ×5 IMPLANT
SPONGE SURGIFOAM ABS GEL 100 (HEMOSTASIS) IMPLANT
STAPLER VISISTAT 35W (STAPLE) ×4 IMPLANT
SUT BONE WAX W31G (SUTURE) ×5 IMPLANT
SUT ETHILON 3 0 FSL (SUTURE) ×2 IMPLANT
SUT ETHILON 3 0 PS 1 (SUTURE) IMPLANT
SUT MNCRL AB 4-0 PS2 18 (SUTURE) ×4 IMPLANT
SUT PROLENE 3 0 SH DA (SUTURE) ×5 IMPLANT
SUT PROLENE 3 0 SH1 36 (SUTURE) ×5 IMPLANT
SUT PROLENE 4 0 RB 1 (SUTURE) ×20
SUT PROLENE 4 0 SH DA (SUTURE) IMPLANT
SUT PROLENE 4-0 RB1 .5 CRCL 36 (SUTURE) ×10 IMPLANT
SUT PROLENE 5 0 C 1 24 (SUTURE) ×5 IMPLANT
SUT PROLENE 5 0 C 1 36 (SUTURE) ×22 IMPLANT
SUT PROLENE 6 0 C 1 30 (SUTURE) ×10 IMPLANT
SUT PROLENE 6 0 CC (SUTURE) ×7 IMPLANT
SUT PROLENE 7 0 BV 1 (SUTURE) IMPLANT
SUT PROLENE 7 0 BV1 MDA (SUTURE) ×7 IMPLANT
SUT PROLENE 8 0 BV175 6 (SUTURE) IMPLANT
SUT SILK  1 MH (SUTURE) ×3
SUT SILK 1 MH (SUTURE) ×12 IMPLANT
SUT SILK 2 0 SH CR/8 (SUTURE) ×9 IMPLANT
SUT SILK 2 0 TIES 10X30 (SUTURE) ×2 IMPLANT
SUT SILK 3 0 (SUTURE) ×5
SUT SILK 3-0 18XBRD TIE 12 (SUTURE) ×1 IMPLANT
SUT STEEL 6MS V (SUTURE) ×10 IMPLANT
SUT STEEL STERNAL CCS#1 18IN (SUTURE) IMPLANT
SUT STEEL SZ 6 DBL 3X14 BALL (SUTURE) IMPLANT
SUT VIC AB 1 CTX 18 (SUTURE) ×5 IMPLANT
SUT VIC AB 1 CTX 36 (SUTURE) ×15
SUT VIC AB 1 CTX36XBRD ANBCTR (SUTURE) ×9 IMPLANT
SUT VIC AB 2-0 CT1 27 (SUTURE) ×35
SUT VIC AB 2-0 CT1 TAPERPNT 27 (SUTURE) ×7 IMPLANT
SUT VIC AB 2-0 CTX 27 (SUTURE) IMPLANT
SUT VIC AB 2-0 CTX 36 (SUTURE) ×12 IMPLANT
SUT VIC AB 3-0 SH 27 (SUTURE) ×10
SUT VIC AB 3-0 SH 27X BRD (SUTURE) ×5 IMPLANT
SUT VIC AB 3-0 X1 27 (SUTURE) ×12 IMPLANT
SUT VICRYL 4-0 PS2 18IN ABS (SUTURE) ×7 IMPLANT
SUTURE E-PAK OPEN HEART (SUTURE) ×5 IMPLANT
SYR 20CC LL (SYRINGE) ×2 IMPLANT
SYR CONTROL 10ML LL (SYRINGE) IMPLANT
SYSTEM SAHARA CHEST DRAIN ATS (WOUND CARE) ×5 IMPLANT
TAPE CLOTH SURG 4X10 WHT LF (GAUZE/BANDAGES/DRESSINGS) ×2 IMPLANT
TAPE UMBILICAL COTTON 1/8X30 (MISCELLANEOUS) ×2 IMPLANT
TOWEL OR 17X24 6PK STRL BLUE (TOWEL DISPOSABLE) ×15 IMPLANT
TOWEL OR 17X26 10 PK STRL BLUE (TOWEL DISPOSABLE) ×15 IMPLANT
TRAY FOLEY CATH 14FRSI W/METER (CATHETERS) ×5 IMPLANT
TRAY FOLEY IC TEMP SENS 14FR (CATHETERS) ×5 IMPLANT
TUBE CONNECTING 12X1/4 (SUCTIONS) ×4 IMPLANT
TUBE SUCT INTRACARD DLP 20F (MISCELLANEOUS) ×5 IMPLANT
TUBING INSUFFLATION 10FT LAP (TUBING) ×5 IMPLANT
UNDERPAD 30X30 INCONTINENT (UNDERPADS AND DIAPERS) ×5 IMPLANT
WATER STERILE IRR 1000ML POUR (IV SOLUTION) ×15 IMPLANT
YANKAUER SUCT BULB TIP NO VENT (SUCTIONS) ×2 IMPLANT

## 2011-09-09 NOTE — Op Note (Signed)
Procedure Right carotid endarterectomy, Aorto-innominate artery bypass, innominate artery endarterectomy  Preoperative diagnosis: High-grade asymptomatic right internal carotid artery stenosis  Postoperative diagnosis: Same  Anesthesia General  Asst.: Gilford Raid, MD, Gerri Lins, [facility]  Operative findings: #1 greater than 80% right internal carotid artery stenosis with calcified plaque  #2 10 French shunt  #3 Dacron patch #4 12 mm Dacron graft for innominate bypass  Operative details: After obtaining informed consent, the patient was taken to the operating room. The patient was placed in a supine position on the operating room table. After induction of general anesthesia and endotracheal intubation a Foley catheter was placed. Next the patient's entire neck and chest was prepped and draped in the usual sterile fashion. An oblique incision was made on the right aspect of the patient's neck anterior to the border the right sternocleidomastoid muscle. The incision was carried into the subcutaneous tissues and through the platysma. The sternocleidomastoid muscle was identified and reflected laterally. The omohyoid muscle was identified and this was divided with cautery. The common carotid artery was then found at the base of the incision this was dissected free circumferentially. The vagus nerve was identified and protected. Dissection was then carried up to the level carotid bifurcation. The Ansa cervicalis was identified and traced up to its insertion into the hypoglossal nerve.  The ansa was divided at this level to assist with exposure.  The hyperglossal nerve was identified and protected. The internal carotid artery was dissected free circumferentially just below the level of the hypoglossal nerve and it was soft in character at this location and above any palpable disease. A vessel loop was placed around this. Next the external carotid and superior thyroid arteries were dissected free  circumferentially and vessel loops were placed around these. The patient was given 15000 units of intravenous heparin. After 2 minutes of circulation time and raising the mean arterial pressure to 85 mm mercury, the distal internal carotid artery was controlled with small bulldog clamp. The external carotid and superior thyroid arteries were controlled with vessel loops. The common carotid artery was controlled with a peripheral DeBakey clamp. A longitudinal opening was made in the common carotid artery just below the bifurcation. The arteriotomy was extended distally up into the internal carotid with Potts scissors. There was a large calcified plaque with greater than 80% stenosis at the carotid bifurcation. The arteriotomy was extended past the level of stenosis. Next a 28 [place] shunt was brought in the operative field was threaded into the distal internal carotid artery and allowed to backbleed thoroughly. There was brisk backbleeding from the internal carotid artery around the shunt requiring a distal Javid shunt clamp.  This shunt was then threaded into the common carotid artery and secured with a Rummel tourniquet. The shunt was inspected and found to be free of air. Flow was then restored to the brain after approximately 6 minutes of ischemia time.  There was also some bleeding around the proximal shunt so this was also controlled with a Javid shunt clamp. Attention was then turned to the common carotid artery once again. A suitable endarterectomy plane was obtained and endarterectomy was begun in the common carotid artery and a good proximal endpoint was obtained. An eversion endarterectomy was performed on the external carotid artery and a good endpoint was obtained. The plaque was then elevated in the internal carotid artery and a nice feathered distal endpoint was also obtained. The plaque was passed off the table as a specimen. All loose debris was then  removed from the carotid bed and everything was  thoroughly irrigated with heparinized saline. A Dacron patch was then brought on to the operative field and this was sewn on as a patch angioplasty using a running 6-0 Prolene suture. Prior to completion of the anastomosis the shunt was reoccluded and brought down out of the distal internal carotid artery. The internal carotid artery was thoroughly backbled. This was then controlled again with small bulldog clamp. The shunt was then removed from the proximal common carotid artery and this is again secured with a peripheral DeBakey clamp after forward bleeding. The external carotid was also thoroughly backbled.  The remainder of the patch was completed and the anastomosis was secured. Flow was then restored first retrograde from the external carotid into the carotid bed then antegrade from the common carotid to the external carotid artery and after approximately 5 cardiac cycles to the internal carotid artery. Doppler was used to evaluate the external/internal and common carotid arteries and these all had good Doppler flow. Hemostasis was obtained with an additional repair stitch on the medial wall of the artery as well as thrombin and gelfoam.   At this point, Dr Cyndia Bent began his portion of the case and the CABG procedure is dictated separately.  After Dr Cyndia Bent had made a sternotomy, gone on bypass, and created distal anastomoses for the coronary grafts, the innominate, right common carotid, and right subclavian arteries were dissected free circumferentially.  There was dense calcification in the proximal innominate artery with minimal pulse.  The ascending aorta was opened longitudinally just below the aortic cannulation site and a 12 mm dacron graft was brought onto the field.  This was beveled on its proximal end and sewn end of graft to side of aorta with a running 5 0 prolene.  At completion of the anastomosis, the graft was clamped until the proximal coronary grafts were completed.  Next the right common  carotid and right subclavian arteries were clamped with Henley clamps.  The proximal innominate was controlled with a peripheral Debakey clamp.  The innominate was transected just proximal to the bifurcation.  The was some calcified plaque also at this level and an endarterectomy was performed to decrease stenosis of the subclavian orifice as well as create a reasonable sewing surface. A reasonable endpoint was obtained on the subclavian and the common carotid was endarterectomized by eversion technique so the distal endpoint could easily be inspected and this was a good endpoint.  The graft was then cut to length and sewn end to end to the innominate using a running 5 0 Prolene.  Prior to completion everything was forebled and backbled.  Flow was then first restored retrograde from the subclavian and then antegrade from the proximal graft to the subclavian and after 5 cardiac cycles to the common carotid.  A few pledgeted repair sutures were placed along the posterior wall to achieve hemostasis.  There was a good pulse in the proximal carotid and subclavian after coming off bypass.  The remainder of the chest closure is dictated as a separate operative note by Dr Cyndia Bent.   At conclusion of the case the thrombin and gelfoam were removed from the neck incision.  A 10 flat JP drain was brought out through a separate stab incision and secured to the skin with a stitch.   The platysma muscle was reapproximated using a running 3-0 Vicryl suture. The skin was closed with 4 0 Vicryl subcuticular stitch.  Anthony Hinds, MD [facility] [employer] Office: [phone] Pager: [phone]

## 2011-09-09 NOTE — Brief Op Note (Addendum)
09/09/2011  6:13 PM  PATIENT:  Harrison Mons  71 y.o. male  PRE-OPERATIVE DIAGNOSIS: 1. Multivessel CAD 2.Right carotid artery stenosis 3.Innominate artery stenosis  POST-OPERATIVE DIAGNOSIS:    1. Multivessel CAD 2.Right carotid artery stenosis 3.Innominate artery stenosis  PROCEDURE: CORONARY ARTERY BYPASS GRAFTING (CABG)x3 (LIMA to LAD,SVG to Circumflex, and SVG to PDA) with EVH from right thigh and partial calf  SURGEON:  Surgeon(s) and Role:  Gaye Pollack, MD - Primary  Assistant:  Ruta Hinds, MD   PHYSICIAN ASSISTANTS: Gerri Lins PA-C assisted Dr. Oneida Alar and Lars Pinks PA-C assisted Dr. Cyndia Bent   ANESTHESIA:   general  EBL:  Total I/O In: 2920 [I.V.:2000; Blood:920] Out: 1300 [Urine:1300]  DRAINS:  Chest Tube(s) in the Mediastinal and Pleural Spaces    COUNTS CORRECT:  YES  DICTATION: .Dragon Dictation  PLAN OF CARE: Admit to inpatient   PATIENT DISPOSITION:  ICU - intubated and hemodynamically stable.   Delay start of Pharmacological VTE agent (>24hrs) due to surgical blood loss or risk of bleeding: yes  PRE OP WEIGHT: 136 kg

## 2011-09-09 NOTE — Interval H&P Note (Signed)
History and Physical Interval Note:  09/09/2011 8:00 AM  [name]  has presented today for surgery, with the diagnosis of CAD  The various methods of treatment have been discussed with the patient and family. After consideration of risks, benefits and other options for treatment, the patient has consented to  Procedure(s) (LRB): CORONARY ARTERY BYPASS GRAFTING (CABG) (N/A) ENDARTERECTOMY CAROTID (Right) AORTA -INNOMIATE BYPASS (N/A) as a surgical intervention .  The patients' history has been reviewed, patient examined, no change in status, stable for surgery.  I have reviewed the patients' chart and labs.  Questions were answered to the patient's satisfaction.     Gaye Pollack

## 2011-09-09 NOTE — OR Nursing (Signed)
All charting from [id number] done by [facility]

## 2011-09-09 NOTE — Transfer of Care (Signed)
Immediate Anesthesia Transfer of Care Note  Patient: [name]  Procedure(s) Performed: Procedure(s) (LRB): ENDARTERECTOMY CAROTID (Right) CORONARY ARTERY BYPASS GRAFTING (CABG) (N/A) AORTA -INNOMIATE BYPASS ()  Patient Location: SICU  Anesthesia Type: General  Level of Consciousness: Patient remains intubated per anesthesia plan  Airway & Oxygen Therapy: Patient remains intubated per anesthesia plan and Patient placed on Ventilator (see vital sign flow sheet for setting)  Post-op Assessment: Report given to PACU RN and Post -op Vital signs reviewed and stable  Post vital signs: Reviewed and stable  Complications: No apparent anesthesia complications

## 2011-09-09 NOTE — Anesthesia Preprocedure Evaluation (Addendum)
Anesthesia Evaluation  Patient identified by MRN, date of birth, ID band Patient awake    Reviewed: Allergy & Precautions, H&P , NPO status , Patient's Chart, lab work & pertinent test results  History of Anesthesia Complications Negative for: history of anesthetic complications  Airway Mallampati: II      Dental  (+) Teeth Intact   Pulmonary pneumonia ,  breath sounds clear to auscultation        Cardiovascular hypertension, Pt. on medications + CAD and + Peripheral Vascular Disease Rhythm:Regular Rate:Bradycardia     Neuro/Psych  Neuromuscular disease    GI/Hepatic Neg liver ROS, GERD-  Controlled,  Endo/Other  Diabetes mellitus-, Type 2, Oral Hypoglycemic Agents  Renal/GU negative Renal ROS     Musculoskeletal negative musculoskeletal ROS (+)   Abdominal   Peds  Hematology negative hematology ROS (+)   Anesthesia Other Findings   Reproductive/Obstetrics                         Anesthesia Physical Anesthesia Plan  ASA: III  Anesthesia Plan: General   Post-op Pain Management:    Induction: Intravenous  Airway Management Planned: Oral ETT  Additional Equipment: PA Cath, Arterial line and Ultrasound Guidance Line Placement  Intra-op Plan:   Post-operative Plan: Post-operative intubation/ventilation  Informed Consent: I have reviewed the patients History and Physical, chart, labs and discussed the procedure including the risks, benefits and alternatives for the proposed anesthesia with the patient or authorized representative who has indicated his/her understanding and acceptance.   Dental advisory given  Plan Discussed with: Anesthesiologist and Surgeon  Anesthesia Plan Comments:        Anesthesia Quick Evaluation

## 2011-09-09 NOTE — Progress Notes (Signed)
Filed Vitals:   09/09/11 2030 09/09/11 2045 09/09/11 2100 09/09/11 2115  BP:   99/57   Pulse: 90 90 90 90  Temp: 98.6 F (37 C) 98.8 F (37.1 C) 98.4 F (36.9 C) 98.2 F (36.8 C)  TempSrc:      Resp: 16 16 15 19   Weight:      SpO2: 100% 100% 100% 100%   CI= 2.3 on dop 5, levophed 4.    Still asleep on vent  CBC    Component Value Date/Time   WBC 10.5 09/09/2011 1955   RBC 3.06* 09/09/2011 1955   HGB 9.4* 09/09/2011 1955   HCT 27.2* 09/09/2011 1955   PLT 146* 09/09/2011 1955   MCV 88.9 09/09/2011 1955   MCH 30.7 09/09/2011 1955   MCHC 34.6 09/09/2011 1955   RDW 16.2* 09/09/2011 1955   LYMPHSABS 1.0 03/09/2011 1418   MONOABS 0.5 03/09/2011 1418   EOSABS 0.2 03/09/2011 1418   BASOSABS 0.0 03/09/2011 1418    BMET    Component Value Date/Time   NA 140 09/09/2011 1844   K 4.1 09/09/2011 1844   CL 104 09/07/2011 1647   CO2 26 09/07/2011 1647   GLUCOSE 155* 09/09/2011 1844   BUN 22 09/07/2011 1647   CREATININE 0.91 09/07/2011 1647   CALCIUM 9.8 09/07/2011 1647   GFRNONAA 83* 09/07/2011 1647   GFRAA >90 09/07/2011 1647    CT output low Urine output ok.  A/P:  Hemodynamically stable postop.  Wean vasopressors as tolerated.  He has a lot of facial edema and had a long surgery so will wait until the am to extubate, assuming he is ready then.

## 2011-09-09 NOTE — Progress Notes (Signed)
Pt reports falling against corner of wood table on Saturday. Pt reports having increased pain with respiration in right side and point tenderness noted lateral to mid axillary line at level of 5th - 6th intercostal space. Dr. Cyndia Bent notified no further orders given.

## 2011-09-09 NOTE — Anesthesia Procedure Notes (Addendum)
Procedures [phone]:. Procedures: Left IJ Gordy Councilman Catheter Insertion: After talking to the patient in detail with preanesthesia consultation and questions, the patient gave consent for the procedure and the line placements. The patient had questions about the Left IJ procedure and risks and all questions were answered and consent given. The patient was then identified again and consent obtained.  TO was performed, and full barrier precautions were used.  The skin was anesthetized with lidocaine-4cc plain with 25g needle.  Once the vein was located with the 22 ga. needle using ultrasound guidance , the wire was inserted into the vein.  The wire location was confirmed with ultrasound.  The tissue was dilated and the 8.5 [place] cordis catheter was carefully inserted. Afterwards Gordy Councilman catheter was inserted. PA catheter at 55cm.  The patient tolerated the procedure well.     1000: Gordy Councilman readjusted. PA rezeroed. Pa 54cm. CE

## 2011-09-09 NOTE — OR Nursing (Signed)
09:58am Dr. Oneida Alar started Carotid endarterectomy - procedure ended @ 12:00 noon. Dr. Cyndia Bent began CABG procedure @ 12:15pm - vol. Desk notified @ 12:20pm to inform family of start of 2nd procedure.

## 2011-09-10 ENCOUNTER — Inpatient Hospital Stay (HOSPITAL_COMMUNITY): Payer: Medicare Other

## 2011-09-10 LAB — PREPARE FRESH FROZEN PLASMA: Unit division: 0

## 2011-09-10 LAB — POCT I-STAT 3, ART BLOOD GAS (G3+)
Acid-base deficit: 1 mmol/L (ref 0.0–2.0)
Bicarbonate: 23.4 mEq/L (ref 20.0–24.0)
O2 Saturation: 83 %
O2 Saturation: 83 %
Patient temperature: 37
TCO2: 25 mmol/L (ref 0–100)
TCO2: 24 mmol/L (ref 0–100)
pCO2 arterial: 34 mmHg — ABNORMAL LOW (ref 35.0–45.0)
pH, Arterial: 7.314 — ABNORMAL LOW (ref 7.350–7.450)
pH, Arterial: 7.432 (ref 7.350–7.450)
pO2, Arterial: 60 mmHg — ABNORMAL LOW (ref 80.0–100.0)

## 2011-09-10 LAB — GLUCOSE, CAPILLARY
Glucose-Capillary: 127 mg/dL — ABNORMAL HIGH (ref 70–99)
Glucose-Capillary: 104 mg/dL — ABNORMAL HIGH (ref 70–99)
Glucose-Capillary: 120 mg/dL — ABNORMAL HIGH (ref 70–99)
Glucose-Capillary: 100 mg/dL — ABNORMAL HIGH (ref 70–99)
Glucose-Capillary: 73 mg/dL (ref 70–99)
Glucose-Capillary: 123 mg/dL — ABNORMAL HIGH (ref 70–99)
Glucose-Capillary: 111 mg/dL — ABNORMAL HIGH (ref 70–99)
Glucose-Capillary: 114 mg/dL — ABNORMAL HIGH (ref 70–99)
Glucose-Capillary: 140 mg/dL — ABNORMAL HIGH (ref 70–99)
Glucose-Capillary: 98 mg/dL (ref 70–99)
Glucose-Capillary: 89 mg/dL (ref 70–99)
Glucose-Capillary: 96 mg/dL (ref 70–99)

## 2011-09-10 LAB — PREPARE PLATELET PHERESIS

## 2011-09-10 LAB — PREPARE CRYOPRECIPITATE: Unit division: 0

## 2011-09-10 LAB — POCT I-STAT, CHEM 8
Calcium, Ion: 1.11 mmol/L — ABNORMAL LOW (ref 1.12–1.32)
HCT: 24 % — ABNORMAL LOW (ref 39.0–52.0)
Hemoglobin: 8.2 g/dL — ABNORMAL LOW (ref 13.0–17.0)
TCO2: 23 mmol/L (ref 0–100)

## 2011-09-10 LAB — BASIC METABOLIC PANEL
BUN: 18 mg/dL (ref 6–23)
CO2: 23 mEq/L (ref 19–32)
Calcium: 7.8 mg/dL — ABNORMAL LOW (ref 8.4–10.5)
GFR calc non Af Amer: 81 mL/min — ABNORMAL LOW (ref >90)
Glucose, Bld: 152 mg/dL — ABNORMAL HIGH (ref 70–99)
Sodium: 139 mEq/L (ref 135–145)

## 2011-09-10 LAB — CBC
HCT: 22.9 % — ABNORMAL LOW (ref 39.0–52.0)
Hemoglobin: 8.1 g/dL — ABNORMAL LOW (ref 13.0–17.0)
MCH: 30.6 pg (ref 26.0–34.0)
MCH: 30.7 pg (ref 26.0–34.0)
MCHC: 35.4 g/dL (ref 30.0–36.0)
MCHC: 34.8 g/dL (ref 30.0–36.0)
MCV: 86.4 fL (ref 78.0–100.0)
MCV: 88.3 fL (ref 78.0–100.0)
Platelets: 100 10*3/uL — ABNORMAL LOW (ref 150–400)
RBC: 2.65 MIL/uL — ABNORMAL LOW (ref 4.22–5.81)
RBC: 2.83 MIL/uL — ABNORMAL LOW (ref 4.22–5.81)

## 2011-09-10 LAB — POCT I-STAT GLUCOSE: Operator id: 235491

## 2011-09-10 LAB — PREPARE RBC (CROSSMATCH)

## 2011-09-10 MED ORDER — FUROSEMIDE 10 MG/ML IJ SOLN
80.0000 mg | Freq: Once | INTRAMUSCULAR | Status: AC
  Administered 2011-09-10: 80 mg via INTRAVENOUS
  Filled 2011-09-10: qty 8

## 2011-09-10 MED ORDER — DIGOXIN 0.25 MG/ML IJ SOLN
0.5000 mg | Freq: Once | INTRAMUSCULAR | Status: AC
  Administered 2011-09-10: 0.5 mg via INTRAVENOUS
  Filled 2011-09-10: qty 2

## 2011-09-10 MED ORDER — NOREPINEPHRINE BITARTRATE 1 MG/ML IJ SOLN
2.0000 ug/min | INTRAVENOUS | Status: AC
  Filled 2011-09-10 (×2): qty 4

## 2011-09-10 MED ORDER — AMIODARONE LOAD VIA INFUSION
150.0000 mg | Freq: Once | INTRAVENOUS | Status: DC
  Filled 2011-09-10: qty 83.34

## 2011-09-10 MED ORDER — AMIODARONE HCL IN DEXTROSE 360-4.14 MG/200ML-% IV SOLN: 30.0000 mg/h | INTRAVENOUS | Status: DC

## 2011-09-10 MED ORDER — AMIODARONE HCL IN DEXTROSE 360-4.14 MG/200ML-% IV SOLN: 60.0000 mg/h | INTRAVENOUS | Status: DC

## 2011-09-10 MED ORDER — DIGOXIN 0.25 MG/ML IJ SOLN
0.2500 mg | Freq: Every day | INTRAMUSCULAR | Status: DC
  Administered 2011-09-11 – 2011-09-14 (×4): 0.25 mg via INTRAVENOUS
  Filled 2011-09-10 (×6): qty 1

## 2011-09-10 MED ORDER — AMIODARONE HCL IN DEXTROSE 360-4.14 MG/200ML-% IV SOLN
INTRAVENOUS | Status: AC
  Filled 2011-09-10: qty 200

## 2011-09-10 MED FILL — Potassium Chloride Inj 2 mEq/ML: INTRAVENOUS | Qty: 40 | Status: AC

## 2011-09-10 MED FILL — Magnesium Sulfate Inj 50%: INTRAMUSCULAR | Qty: 10 | Status: AC

## 2011-09-10 NOTE — Anesthesia Postprocedure Evaluation (Signed)
  Anesthesia Post-op Note  Patient: [name]  Procedure(s) Performed: Procedure(s) (LRB): ENDARTERECTOMY CAROTID (Right) CORONARY ARTERY BYPASS GRAFTING (CABG) (N/A) AORTA -INNOMIATE BYPASS ()  Patient Location: SICU  Anesthesia Type: General  Level of Consciousness: sedated, unresponsive and Patient remains intubated per anesthesia plan  Airway and Oxygen Therapy: Patient remains intubated per anesthesia plan and Patient placed on Ventilator (see vital sign flow sheet for setting)  Post-op Pain: none  Post-op Assessment: Post-op Vital signs reviewed and Patient's Cardiovascular Status Stable  Post-op Vital Signs: stable  Complications: No apparent anesthesia complications

## 2011-09-10 NOTE — Op Note (Signed)
NAMESAI, LICURSI NO.:  [id number]  MEDICAL RECORD NO.:  [id number]  LOCATION:  2302                         FACILITY:  [facility]  PHYSICIAN:  Gilford Raid, M.D.     DATE OF BIRTH:  07-Jun-1940  DATE OF PROCEDURE:  09/09/2011 DATE OF DISCHARGE:                              OPERATIVE REPORT   PREOPERATIVE DIAGNOSIS:  Severe multivessel coronary artery disease.  POSTOPERATIVE DIAGNOSIS:  Severe multivessel coronary artery disease.  OPERATIVE PROCEDURES:  Median sternotomy, extracorporeal circulation, coronary artery bypass graft surgery x3 using a left internal mammary artery graft to left anterior descending coronary artery, with a saphenous vein graft to the obtuse marginal branch of the left circumflex coronary artery, and a saphenous vein graft to the posterior descending branch of the right coronary artery.  Endoscopic vein harvesting from the right leg.  ATTENDING SURGEON:  Gilford Raid, MD  ASSISTANTS: 1. Jessy Oto. Fields, MD 2. Lars Pinks, PA-C  ANESTHESIA:  General endotracheal.  CLINICAL HISTORY:  This patient is a 71 year old gentleman with a long history of vascular disease, who has previously undergone aortobifemoral bypass and then left carotid endarterectomy in November 2012.  He had a known high-grade right internal carotid artery stenosis that was asymptomatic.  His workup revealed calcified 70% stenosis at the origin of the innominate artery.  He was seen by Cardiology for preoperative clearance which revealed significant multivessel coronary artery disease by catheterization with an occluded right coronary artery with collaterals to the distal vessel from the left circumflex.  The LAD had eccentric 70% proximal stenosis.  The left circumflex was diffusely diseased with 50% proximal stenosis as well as 50-60% stenosis in the proximal portion of the major obtuse marginal branch.  There is heavy calcification throughout the  vessels.  Left ventricular ejection fraction is about 55%.  After review of all these studies, it was felt the best option for this patient was to perform coronary artery bypass graft surgery along with aorto-innominate bypass and right carotid endarterectomy.  Dr. Oneida Alar and I discussed the procedures with the patient.  I discussed the alternatives, benefits, and risks including, but not limited to bleeding, blood transfusion, infection, stroke, myocardial infarction, graft failure, and death.  He understood all this and agreed to proceed.  OPERATIVE PROCEDURE:  The patient was taken to the operating room and placed on the table in a supine position.  After induction of general endotracheal anesthesia, a Foley catheter was placed in bladder using sterile technique.  Then, the neck, chest, abdomen, and both lower extremities were prepped and draped in usual sterile manner.  The right carotid endarterectomy was performed first by Dr. Oneida Alar and will be dictated by him.  At the conclusion of that procedure, the neck wound was packed with gauze sponges and closed with some interrupted silk sutures to maintain hemostasis.  Then, attention was turned to the chest.  The chest was entered through a median sternotomy incision.  The pericardium was opened in midline.  Examination of the heart showed good ventricular contractility.  The heart was fairly large.  The ascending aorta was of normal size but relatively short, probably due to the patient's obesity.  There was heavy  calcified plaque at the ostial and proximal portion of the innominate artery as well as continuing throughout the aortic arch  as far as I could feel.  Proximal to the innominate artery, the aorta was soft.  Then, the left internal mammary artery was harvested from the chest wall as a pedicle graft.  This was a large caliber vessel with excellent blood flow through it.  At the same time, a segment of greater saphenous vein  was harvested from the right leg using endoscopic vein harvest technique.  This vein was of medium to large caliber and fair quality with some mild varicosities present.  Then, the innominate artery was dissected superior to the innominate vein.  Dissection was continued out into the proximal portion of the right common carotid and the right subclavian artery.  Then, the patient was heparinized, and when an adequate ACT was obtained, the distal ascending aorta was cannulated using a 22-French aortic cannula for arterial inflow.  Venous outflow was achieved using a two-stage venous cannula for the right atrial appendage.  An antegrade cardioplegia and vent cannula was inserted in the aortic root.  The patient was placed on cardiopulmonary bypass and the distal coronary identified.  The LAD was a large graftable vessel.  The obtuse marginal was also a large graftable vessel.  The right coronary artery was diffusely diseased.  The posterior descending branch was smaller but was suitable for grafting.  Then, the aorta was cross-clamped and 1000 mL of cold blood antegrade cardioplegia was administered in the aortic root with quick arrest of the heart.  Systemic hypothermia to 32 degrees Centigrade and topical hypothermic iced saline was used.  A temperature probe was placed in the septum and insulating pad in the pericardium.  Then, the first distal anastomosis was performed to the obtuse marginal branch.  The internal diameter was 1.75 mm.  The conduit used was a segment of greater saphenous vein and the anastomosis was performed in end-to-side manner using continuous 7-0 Prolene suture.  Flow was noted through the graft and was excellent.  The second distal anastomosis was performed to the posterior descending coronary artery.  The internal diameter was about 1.6 mm.  The conduit used was a second segment of greater saphenous vein and the anastomosis was performed in end-to-side manner  using continuous 7-0 Prolene suture. Flow was noted through the graft and was excellent.  The third distal anastomosis was performed to the distal LAD.  The internal diameter of this vessel was about 1.75-2 mm.  The conduit used was a left internal mammary graft and this was brought through an opening in left pericardium anterior to the phrenic nerve.  This was anastomosed to the LAD in end-to-side manner using continuous 8-0 Prolene suture.  The pedicle was sutured to the epicardium with 6-0 Prolene sutures.  Then, the patient was given another dose of cardioplegia.  Then, attention was turned to the aorto-innominate bypass.  The proximal anastomosis of this bypass was performed to the proximal ascending aorta along the right lateral wall.  This will be dictated by Dr. Oneida Alar. After the proximal end of the graft was anastomosed to the aorta, I anastomosed the 2 vein grafts to the proximal ascending aorta along the anterior and left lateral surfaces of the aorta.  These were performed in end-to-side manner using continuous 6-0 Prolene suture.  Then, the clamp was removed from the mammary pedicle.  There was rapid warming of the ventricular septum and return of spontaneous ventricular fibrillation.  The cross-clamp was then removed with a time of 113 minutes, and the patient spontaneously converted to sinus rhythm.  We re- warmed to 37 degrees Centigrade.  While we were rewarming, the distal anastomosis of the aorto-innominate bypass was performed to the innominate artery just proximal to the bifurcation into the right carotid and right subclavian arteries.  The proximal stump of the innominate artery was oversewn with a double layer of continuous 5-0 Prolene suture.  When the patient had re-warmed to 37 degrees Centigrade, he was weaned from cardiopulmonary bypass on low-dose dopamine.  Total bypass time was 207 minutes.  Cardiac function appeared excellent with cardiac output of 6  liters per minute.  Then, protamine was given and the venous and aortic cannulas removed without difficulty. Hemostasis was achieved.  We did give him 2 units of fresh frozen plasma and a unit of platelets due to some obvious coagulopathy.  We then sent off more coagulation studies which revealed low fibrinogen and we gave him cryoprecipitate which was not given until we reached the intensive care unit.  After obtaining hemostasis in the operating room, three chest tubes were placed with the tube in the posterior pericardium, one in the left pleural space, and one in the anterior mediastinum.  The pericardium and thymic tissue were then reapproximated over the aorto- innominate graft.  This graft was lying anterior to the innominate vein. Then, the sternum was reapproximated with double #6 stainless steel wires.  The fascia was closed with continuous #1 Vicryl suture. Subcutaneous tissue was closed with continuous 2-0 Vicryl, and the skin with a 3-0 Vicryl subcuticular closure.  It should be noted that we did use 2 right atrial and 2 right ventricular pacing leads.  Then, attention was turned to the right neck.  The wound was opened and the gauze packing removed.  The wound was irrigated with saline solution.  The wound appeared hemostatic.  The Jackson-Pratt drain was brought through a separate stab incision and positioned in the carotid bed.  Then, the platysma muscle was reapproximated as was the skin with subcuticular suture.  The vein harvest tunnel was also bleeding a lot during the procedure, and our physician assistant spent a considerable amount of time trying to obtain hemostasis within the tunnel.  This required making a few counterincisions along the length of the tunnel to control bleeding sites.  At the conclusion of the procedure, the tunnel appeared hemostatic.  A Jackson-Pratt drain was left within the tunnel and brought through a separate stab incision.  The incisions  were closed in layers.  The sponge, needle, and instrument counts were correct according to the scrub nurse.  Dry sterile dressings were applied over the incision and around the chest tubes which were hooked to Pleur-Evac suction.  The patient remained hemodynamically stable and was transferred to the SICU in guarded but stable condition.     Gilford Raid, M.D.     BB/MEDQ  D:  09/10/2011  T:  09/10/2011  Job:  [id number]

## 2011-09-10 NOTE — Progress Notes (Signed)
Patient ID: MARKEZ ULRICH, male   DOB: Apr 05, 1941, 71 y.o.   MRN: [id number] POD # 1   Still intubated, sedated  BP 95/41  Pulse 90  Temp(Src) 99.9 F (37.7 C) (Core (Comment))  Resp 15  Ht 5' 10.87" (1.8 m)  Wt 307 lb 8.7 oz (139.5 kg)  BMI 43.06 kg/m2  SpO2 97%   Intake/Output Summary (Last 24 hours) at 09/10/11 1732 Last data filed at 09/10/11 1600  Gross per 24 hour  Intake 12743.24 ml  Output   5791 ml  Net 6952.24 ml   Only moderate response to lasix  Creatinine up to 1.4  Transient atrial fib- digoxin ordered

## 2011-09-10 NOTE — Progress Notes (Addendum)
VASCULAR AND VEIN SURGERY POST - OP CEA PROGRESS NOTE  Date of Surgery: 09/09/2011 Surgeon: Surgeon(s): Gaye Pollack, MD Elam Dutch, MD 1 Day Post-Op right Carotid Endarterectomy . Right Dacron graft for innominate bypass.      HPI: [name] is a 71 y.o. male who is 1 Day Post-Op right Carotid Endarterectomy and Right Dacron graft for innominate bypass.   . Patient is doing well. Pre-operative symptoms are Improved perfusion.  IMAGING: Dg Chest Portable 1 View  09/09/2011  *RADIOLOGY REPORT*  Clinical Data: Status post CABG.  PORTABLE CHEST - 1 VIEW  Comparison: PA and lateral chest 09/07/2011.  Findings: Endotracheal tube is in place with tip in good position just below the clavicular heads.  NG tube courses into the stomach and below the inferior margin of the film.  A left IJ approach Swan- Ganz catheter is in place with the tip of the Swan just within the right main pulmonary artery.  Left chest tube is identified.  The patient has some basilar atelectasis, greater on the left.  No pneumothorax.  No pulmonary edema.  Cardiomegaly.  IMPRESSION:  1.  Support apparatus in good position.  No pneumothorax. 2.  Basilar atelectasis, greater on the left.  Original Report Authenticated By: Arvid Right. Luther Parody, M.D.    Significant Diagnostic Studies: CBC Lab Results  Component Value Date   WBC 9.4 09/10/2011   HGB 8.1* 09/10/2011   HCT 22.9* 09/10/2011   MCV 86.4 09/10/2011   PLT 125* 09/10/2011    BMET    Component Value Date/Time   NA 139 09/10/2011 0316   K 4.0 09/10/2011 0316   CL 105 09/10/2011 0316   CO2 23 09/10/2011 0316   GLUCOSE 152* 09/10/2011 0316   BUN 18 09/10/2011 0316   CREATININE 0.96 09/10/2011 0316   CALCIUM 7.8* 09/10/2011 0316   GFRNONAA 81* 09/10/2011 0316   GFRAA >90 09/10/2011 0316    COAG Lab Results  Component Value Date   INR 1.48 09/09/2011   INR 1.63* 09/09/2011   INR 1.00 09/07/2011   No results found for this basename: PTT       Intake/Output Summary (Last 24 hours) at 09/10/11 0742 Last data filed at 09/10/11 0702  Gross per 24 hour  Intake 11420.9 ml  Output   5841 ml  Net 5579.9 ml    Physical Exam:  BP Readings from Last 3 Encounters:  09/10/11 89/48  09/10/11 89/48  09/07/11 162/59   Temp Readings from Last 3 Encounters:  09/10/11 99.3 F (37.4 C) Core (Comment)  09/10/11 99.3 F (37.4 C) Core (Comment)  09/07/11 97.7 F (36.5 C) Oral   SpO2 Readings from Last 3 Encounters:  09/10/11 93%  09/10/11 93%  09/07/11 96%   Pulse Readings from Last 3 Encounters:  09/10/11 90  09/10/11 90  09/07/11 57   PE:  Patient does open his and follows commands to wiggle his toes.  Doppler right radial pulse left is A-line.  Doppler both DP. Swollen face/airway compromise due to length of surgery. Patient is still intubated.  Plan monitor with Cardiac surgeon Dr. Wray Kearns dressing intact as well as drains. They will try to wean him off intubation today      [name] [facility] [id number] 09/10/2011 7:42 AM   No neck hematoma JP minimal Sedated but does follow commands and moves all extremities, hands cool but symmetric, feet cool but symmetric  D/C right neck JP Will get better neuro eval when sedation  off and extubated  Ruta Hinds, MD [facility] Office: [phone] Pager: [phone]

## 2011-09-10 NOTE — Progress Notes (Signed)
1 Day Post-Op Procedure(s) (LRB): ENDARTERECTOMY CAROTID (Right) CORONARY ARTERY BYPASS GRAFTING (CABG) (N/A) AORTA -INNOMIATE BYPASS () Subjective: Intubated and sedated  Objective: Vital signs in last 24 hours: Temp:  [98.2 F (36.8 C)-99.3 F (37.4 C)] 99.3 F (37.4 C) (04/18 0734) Pulse Rate:  [83-92] 90  (04/18 0716) Cardiac Rhythm:  [-] Atrial paced (04/18 0715) Resp:  [9-93] 93  (04/18 0716) BP: (80-109)/(45-57) 89/48 mmHg (04/18 0716) SpO2:  [93 %-100 %] 93 % (04/18 0716) FiO2 (%):  [50 %-100 %] 50 % (04/18 0716) Weight:  [134 kg (295 lb 6.7 oz)-139.5 kg (307 lb 8.7 oz)] 139.5 kg (307 lb 8.7 oz) (04/18 0215)  Hemodynamic parameters for last 24 hours: PAP: (36-45)/(21-30) 36/21 mmHg CO:  [3.8 L/min-6 L/min] 6 L/min CI:  [1.5 L/min/m2-2.4 L/min/m2] 2.4 L/min/m2  Intake/Output from previous day: 04/17 0701 - 04/18 0700 In: 11385.9 [I.V.:6533.9; [id number]; IV [id number] Out: [id number] [Urine:2640; Drains:60; Blood:2911; Chest Tube:230] Intake/Output this shift: Total I/O In: 35 [I.V.:35] Out: -   General appearance: still waking up from anesthesia Neurologic: moving all ext to command Heart: regular rate and rhythm, S1, S2 normal, no murmur, click, rub or gallop Lungs: clear to auscultation bilaterally Abdomen: soft, non-tender; bowel sounds normal; no masses,  no organomegaly Extremities: edema anasarca Wound: dressings dry  Lab Results:  [facility] 09/10/11 0316 09/09/11 2012 09/09/11 1955  WBC 9.4 -- 10.5  HGB 8.1* 9.5* --  HCT 22.9* 28.0* --  PLT 125* -- 146*   BMET:  Basename 09/10/11 0316 09/09/11 2012 09/07/11 1647  NA 139 140 --  K 4.0 4.1 --  CL 105 -- 104  CO2 23 -- 26  GLUCOSE 152* 149* --  BUN 18 -- 22  CREATININE 0.96 -- 0.91  CALCIUM 7.8* -- 9.8    PT/INR:  Basename 09/09/11 1955  LABPROT 18.2*  INR 1.48   ABG    Component Value Date/Time   PHART 7.432 09/10/2011 0329   HCO3 22.6 09/10/2011 0329   TCO2 24 09/10/2011 0329   ACIDBASEDEF 1.0 09/10/2011 0329   O2SAT 91.0 09/10/2011 0329   CBG (last 3)   Basename 09/10/11 0003 09/09/11 2259 09/09/11 2157  GLUCAP 94 104* 127*    Assessment/Plan: S/P Procedure(s) (LRB): ENDARTERECTOMY CAROTID (Right) CORONARY ARTERY BYPASS GRAFTING (CABG) (N/A) AORTA -INNOMIATE BYPASS () Acute blood loss anemia:  Transfuse 2 units prbc's since still on levophed and dopamine Volume excess:  Diurese once transfusion starts Wean vent as tolerated as he awakens.  He had moderate diffusion defect on preop pft's with 59% predicted.   LOS: 1 day    Hussam Muniz K 09/10/2011

## 2011-09-10 NOTE — Progress Notes (Signed)
Rt note  Pt failed wean protocol. MIn cuff leak, unable to do NIF, VC 400, ABG out of parameters. Placed back on full support. DR Cyndia Bent wants to see pt before we proceed with wean again.

## 2011-09-11 ENCOUNTER — Inpatient Hospital Stay (HOSPITAL_COMMUNITY): Payer: Medicare Other

## 2011-09-11 ENCOUNTER — Encounter (HOSPITAL_COMMUNITY): Payer: Self-pay | Admitting: Surgery

## 2011-09-11 LAB — TYPE AND SCREEN
ABO/RH(D): A POS
Antibody Screen: NEGATIVE
Unit division: 0
Unit division: 0

## 2011-09-11 LAB — GLUCOSE, CAPILLARY
Glucose-Capillary: 109 mg/dL — ABNORMAL HIGH (ref 70–99)
Glucose-Capillary: 125 mg/dL — ABNORMAL HIGH (ref 70–99)
Glucose-Capillary: 150 mg/dL — ABNORMAL HIGH (ref 70–99)
Glucose-Capillary: 158 mg/dL — ABNORMAL HIGH (ref 70–99)
Glucose-Capillary: 87 mg/dL (ref 70–99)

## 2011-09-11 LAB — POCT I-STAT 3, ART BLOOD GAS (G3+)
Bicarbonate: 24.4 mEq/L — ABNORMAL HIGH (ref 20.0–24.0)
Bicarbonate: 23.9 mEq/L (ref 20.0–24.0)
O2 Saturation: 87 %
TCO2: 26 mmol/L (ref 0–100)
pCO2 arterial: 38.8 mmHg (ref 35.0–45.0)
pCO2 arterial: 37.3 mmHg (ref 35.0–45.0)
pH, Arterial: 7.408 (ref 7.350–7.450)
pO2, Arterial: 60 mmHg — ABNORMAL LOW (ref 80.0–100.0)

## 2011-09-11 LAB — BASIC METABOLIC PANEL
Calcium: 7.8 mg/dL — ABNORMAL LOW (ref 8.4–10.5)
GFR calc Af Amer: 82 mL/min — ABNORMAL LOW (ref >90)
GFR calc non Af Amer: 71 mL/min — ABNORMAL LOW (ref >90)
Glucose, Bld: 111 mg/dL — ABNORMAL HIGH (ref 70–99)
Sodium: 140 mEq/L (ref 135–145)

## 2011-09-11 LAB — CBC
MCH: 29.7 pg (ref 26.0–34.0)
Platelets: 91 10*3/uL — ABNORMAL LOW (ref 150–400)
RBC: 2.79 MIL/uL — ABNORMAL LOW (ref 4.22–5.81)

## 2011-09-11 MED ORDER — FENTANYL CITRATE 0.05 MG/ML IJ SOLN: 50.0000 ug | INTRAMUSCULAR | Status: DC | PRN

## 2011-09-11 MED ORDER — ENOXAPARIN SODIUM 40 MG/0.4ML ~~LOC~~ SOLN
40.0000 mg | Freq: Every day | SUBCUTANEOUS | Status: DC
  Administered 2011-09-11 – 2011-09-21 (×11): 40 mg via SUBCUTANEOUS
  Filled 2011-09-11 (×11): qty 0.4

## 2011-09-11 MED ORDER — INSULIN GLARGINE 100 UNIT/ML ~~LOC~~ SOLN
20.0000 [IU] | Freq: Every day | SUBCUTANEOUS | Status: DC
  Administered 2011-09-11 – 2011-09-22 (×12): 20 [IU] via SUBCUTANEOUS

## 2011-09-11 MED ORDER — ROSUVASTATIN CALCIUM 10 MG PO TABS
10.0000 mg | ORAL_TABLET | Freq: Every day | ORAL | Status: DC
  Administered 2011-09-12 – 2011-09-28 (×15): 10 mg via ORAL
  Filled 2011-09-11 (×19): qty 1

## 2011-09-11 MED ORDER — INSULIN ASPART 100 UNIT/ML ~~LOC~~ SOLN
0.0000 [IU] | SUBCUTANEOUS | Status: AC
  Administered 2011-09-11: 2 [IU] via SUBCUTANEOUS

## 2011-09-11 MED ORDER — FUROSEMIDE 10 MG/ML IJ SOLN
80.0000 mg | Freq: Two times a day (BID) | INTRAMUSCULAR | Status: DC
  Administered 2011-09-11 – 2011-09-17 (×14): 80 mg via INTRAVENOUS
  Filled 2011-09-11 (×17): qty 8

## 2011-09-11 MED ORDER — INSULIN ASPART 100 UNIT/ML ~~LOC~~ SOLN
0.0000 [IU] | SUBCUTANEOUS | Status: DC
  Administered 2011-09-11: 8 [IU] via SUBCUTANEOUS
  Administered 2011-09-11: 4 [IU] via SUBCUTANEOUS
  Administered 2011-09-11: 2 [IU] via SUBCUTANEOUS
  Administered 2011-09-12: 1 [IU] via SUBCUTANEOUS
  Administered 2011-09-12: 4 [IU] via SUBCUTANEOUS
  Administered 2011-09-12 (×2): 2 [IU] via SUBCUTANEOUS
  Administered 2011-09-12: 4 [IU] via SUBCUTANEOUS
  Administered 2011-09-13 (×2): 2 [IU] via SUBCUTANEOUS
  Administered 2011-09-14: 4 [IU] via SUBCUTANEOUS
  Administered 2011-09-14 (×2): 2 [IU] via SUBCUTANEOUS

## 2011-09-11 MED ORDER — INSULIN ASPART 100 UNIT/ML ~~LOC~~ SOLN: 0.0000 [IU] | SUBCUTANEOUS | Status: DC

## 2011-09-11 MED FILL — Heparin Sodium (Porcine) Inj 1000 Unit/ML: INTRAMUSCULAR | Qty: 30 | Status: AC

## 2011-09-11 MED FILL — Sodium Chloride IV Soln 0.9%: INTRAVENOUS | Qty: 1000 | Status: AC

## 2011-09-11 MED FILL — Electrolyte-R (PH 7.4) Solution: INTRAVENOUS | Qty: 7000 | Status: AC

## 2011-09-11 MED FILL — Mannitol IV Soln 20%: INTRAVENOUS | Qty: 500 | Status: AC

## 2011-09-11 MED FILL — Heparin Sodium (Porcine) Inj 1000 Unit/ML: INTRAMUSCULAR | Qty: 10 | Status: AC

## 2011-09-11 MED FILL — Sodium Bicarbonate IV Soln 8.4%: INTRAVENOUS | Qty: 100 | Status: AC

## 2011-09-11 MED FILL — Albumin, Human Inj 5%: INTRAVENOUS | Qty: 250 | Status: AC

## 2011-09-11 NOTE — Procedures (Signed)
Extubation Procedure Note  Patient Details:   Name: ISMEAL WEHRS DOB: 1941-01-05 MRN: [id number]   Airway Documentation:   Pt extubated to 50%VM per Dr.Bartle. NIF-20, VC 750. No stridor post extubation. Pt able to vocalize. Bilateral breath sounds present.  Evaluation  O2 sats: stable throughout and currently acceptable Complications: No apparent complications Patient did tolerate procedure well. Bilateral Breath Sounds: Clear;Diminished Suctioning: Airway   Lissa Merlin 09/11/2011, 12:57 PM

## 2011-09-11 NOTE — Progress Notes (Addendum)
VASCULAR AND VEIN SURGERY POST - OP CEA PROGRESS NOTE  Date of Surgery: 09/09/2011 Surgeon: Surgeon(s): Gaye Pollack, MD Elam Dutch, MD 2 Days Post-Op right Carotid Endarterectomy .  HPI: [name] is a 71 y.o. male who is 2 Days Post-Op right Carotid Endarterectomy . Patient is doing well.Patient still intubated. Following commands Moving all extremities  Significant Diagnostic Studies: CBC Lab Results  Component Value Date   WBC 8.3 09/11/2011   HGB 8.3* 09/11/2011   HCT 24.4* 09/11/2011   MCV 87.5 09/11/2011   PLT 91* 09/11/2011    BMET    Component Value Date/Time   NA 140 09/11/2011 0356   K 3.9 09/11/2011 0356   CL 106 09/11/2011 0356   CO2 25 09/11/2011 0356   GLUCOSE 111* 09/11/2011 0356   BUN 23 09/11/2011 0356   CREATININE 1.03 09/11/2011 0356   CALCIUM 7.8* 09/11/2011 0356   GFRNONAA 71* 09/11/2011 0356   GFRAA 82* 09/11/2011 0356    COAG Lab Results  Component Value Date   INR 1.48 09/09/2011   INR 1.63* 09/09/2011   INR 1.00 09/07/2011   No results found for this basename: PTT      Intake/Output Summary (Last 24 hours) at 09/11/11 0803 Last data filed at 09/11/11 0700  Gross per 24 hour  Intake 2603.23 ml  Output   2465 ml  Net 138.23 ml    Physical Exam:  BP Readings from Last 3 Encounters:  09/11/11 141/57  09/11/11 141/57  09/07/11 162/59   Temp Readings from Last 3 Encounters:  09/11/11 99.3 F (37.4 C)   09/11/11 99.3 F (37.4 C)   09/07/11 97.7 F (36.5 C) Oral   SpO2 Readings from Last 3 Encounters:  09/11/11 95%  09/11/11 95%  09/07/11 96%   Pulse Readings from Last 3 Encounters:  09/11/11 90  09/11/11 90  09/07/11 57    Pt is A&O x 3  right Neck Wound is healing well Patient with  Negative facial droop Pt has good and equal strength in all extremities  Assessment: [name] is a 71 y.o. male is S/P Right Carotid endarterectomy following commands and moving all extremities well - stable  neurologically.    Richrd Prime [id number] 09/11/2011 8:03 AM   Right neck incision healing Upper and lower extremity motor 5/5 Off vent, holding conversation, cognitively at [place], MD [facility] [place] Office: [phone] Pager: [phone]

## 2011-09-11 NOTE — Progress Notes (Signed)
The patient is awake today. He has been extubated. He denies chest pain.  BP 158/70 mmHg and A paced at 70 bpm.  Had brief A fib with RVR last PM  Baseline conduction is impaired with RBBB pattern.  He was started on digoxin last PM, so we will need to monitor for bradycardia/AV block. Hopefully, atrial fib will not recur. If it does and amiodarone becomes necessary, he may require pacing. No beta blocker therapy for now.

## 2011-09-11 NOTE — Progress Notes (Signed)
TCTS BRIEF SICU PROGRESS NOTE  2 Days Post-Op  S/P Procedure(s) (LRB): ENDARTERECTOMY CAROTID (Right) CORONARY ARTERY BYPASS GRAFTING (CABG) (N/A) AORTA -INNOMIATE BYPASS ()   Stable day Extubated uneventfully AAI paced, BP stable O2 sats 97% on 4 L/min via [place] UOP 50-60 mL/hr  Plan: Continue current plan  Roald Lukacs H 09/11/2011 7:28 PM

## 2011-09-11 NOTE — Progress Notes (Signed)
2 Days Post-Op Procedure(s) (LRB): ENDARTERECTOMY CAROTID (Right) CORONARY ARTERY BYPASS GRAFTING (CABG) (N/A) AORTA -INNOMIATE BYPASS () Subjective: Intubated but alert and following commands  Objective: Vital signs in last 24 hours: Temp:  [99.3 F (37.4 C)-100.4 F (38 C)] 99.3 F (37.4 C) (04/19 0754) Pulse Rate:  [79-112] 90  (04/19 0754) Cardiac Rhythm:  [-] Atrial paced (04/19 0600) Resp:  [14-34] 23  (04/19 0754) BP: (82-141)/(39-68) 141/57 mmHg (04/19 0754) SpO2:  [90 %-100 %] 95 % (04/19 0754) FiO2 (%):  [40 %-50 %] 40 % (04/19 0754) Weight:  [139.9 kg (308 lb 6.8 oz)] 139.9 kg (308 lb 6.8 oz) (04/19 0400)  Hemodynamic parameters for last 24 hours: PAP: (33-48)/(16-29) 45/25 mmHg CO:  [6.5 L/min-7.1 L/min] 6.5 L/min CI:  [2.5 L/min/m2-2.7 L/min/m2] 2.5 L/min/m2  Intake/Output from previous day: 04/18 0701 - 04/19 0700 In: 2731.6 [I.V.:1853.1; Blood:712.5; IV Piggyback:166] Out: 2615 [Urine:1850; Emesis/NG output:200; Drains:165; Chest Tube:400] Intake/Output this shift:    General appearance: alert and cooperative Neurologic: intact Heart: regular rate and rhythm, S1, S2 normal, no murmur, click, rub or gallop Lungs: clear to auscultation bilaterally Extremities: edema moderate anasarca Wound: some serosanguinous drainage from legs incisions  Lab Results:  Basename 09/11/11 0356 09/10/11 1724  WBC 8.3 8.8  HGB 8.3* 8.7*  HCT 24.4* 25.0*  PLT 91* 100*   BMET:  Basename 09/11/11 0356 09/10/11 1724 09/10/11 1720 09/10/11 0316  NA 140 -- 141 --  K 3.9 -- 4.1 --  CL 106 -- 104 --  CO2 25 -- -- 23  GLUCOSE 111* -- 137* --  BUN 23 -- 21 --  CREATININE 1.03 1.25 -- --  CALCIUM 7.8* -- -- 7.8*    PT/INR:  Basename 09/09/11 1955  LABPROT 18.2*  INR 1.48   ABG    Component Value Date/Time   PHART 7.355 09/10/2011 1046   HCO3 24.8* 09/10/2011 1046   TCO2 23 09/10/2011 1720   ACIDBASEDEF 1.0 09/10/2011 1046   O2SAT 83.0 09/10/2011 1046   CBG (last 3)     Basename 09/11/11 0554 09/11/11 0353 09/11/11 0010  GLUCAP 125* 111* 87   CXR:  Bilateral lower lobe atel  Assessment/Plan: S/P Procedure(s) (LRB): ENDARTERECTOMY CAROTID (Right) CORONARY ARTERY BYPASS GRAFTING (CABG) (N/A) AORTA -INNOMIATE BYPASS () Diuresis Diabetes control d/c tubes/lines Continue foley due to patient in ICU and urinary output monitoring Extubate.  May need bipap.   LOS: 2 days    Gilford Raid K 09/11/2011

## 2011-09-11 NOTE — Progress Notes (Signed)
INITIAL ADULT NUTRITION ASSESSMENT Date: 09/11/2011   Time: 12:00 PM  Reason for Assessment: VDRF  ASSESSMENT: Male 71 y.o.  Dx: s/p CABG, carotid endarterectomy, aorta-innomiate bypass  Hx:  Past Medical History  Diagnosis Date  . Chest pain   . Muscle pain   . Diarrhea   . Hyperlipidemia     takes Tricor and Lipitor daily  . Hyperlipidemia   . Atherosclerotic heart disease   . PVD (peripheral vascular disease)   . Peripheral neuropathy   . Seasonal allergies     takes Mucinex and Zyrtec prn  . Nasal polyps     hx of  . Edema     right leg knee down swollen since fall 3 wks ago  . Back pain   . GERD (gastroesophageal reflux disease)     Rolaids as needed-occ reflux  . Colon polyps   . Renal insufficiency   . Urinary frequency     urgency/takes Vesicare daily  . Kidney stone     35+yrs ago  . Pneumonia     as a child  . Hypertension     takes Altace,Amlodipine,and Nadolol daily  . Peripheral edema     wears knee length hose-told by podiatrist to wear these  . Peripheral neuropathy     takes Gabapentin daily  . Arthritis   . Bruises easily     takes Pletal daily and ASA 325mg  daily  . Hemorrhoids   . Cyst     on perineum;takes Doxycycline daily  . History of kidney stones     at age 51   . Diabetes mellitus     takes Glipizide,Metformin,and Actos daily  . Cataract immature     right    Related Meds:     . acetaminophen  1,000 mg Oral Q6H   Or  . acetaminophen (TYLENOL) oral liquid 160 mg/5 mL  975 mg Per Tube Q6H  . amiodarone (NEXTERONE PREMIX) 360 mg/200 mL dextrose      . aspirin EC  325 mg Oral Daily   Or  . aspirin  324 mg Per Tube Daily  . bisacodyl  10 mg Oral Daily   Or  . bisacodyl  10 mg Rectal Daily  . cefUROXime (ZINACEF)  IV  1.5 g Intravenous Q12H  . digoxin  0.25 mg Intravenous Daily  . digoxin  0.5 mg Intravenous Once  . docusate sodium  200 mg Oral Daily  . enoxaparin  40 mg Subcutaneous Daily  . fenofibrate  160 mg Oral  Daily  . furosemide  80 mg Intravenous Once  . furosemide  80 mg Intravenous BID  . gabapentin  300 mg Oral BID  . insulin aspart  0-24 Units Subcutaneous Q2H   Followed by  . insulin aspart  0-24 Units Subcutaneous Q4H  . insulin glargine  20 Units Subcutaneous Daily  . insulin regular  0-10 Units Intravenous TID WC  . pantoprazole  40 mg Oral Q1200  . simvastatin  40 mg Oral Daily  . sodium chloride  3 mL Intravenous Q12H  . DISCONTD: amiodarone  150 mg Intravenous Once  . DISCONTD: insulin aspart  0-24 Units Subcutaneous Q4H  . DISCONTD: metoprolol tartrate  12.5 mg Per Tube BID  . DISCONTD: metoprolol tartrate  12.5 mg Oral BID    Ht: 5' 10.87" (180 cm) (on 09/07/11)  Wt: 308 lb 6.8 oz (139.9 kg)  Ideal Wt: 75.4 kg % Ideal Wt: 185%  Usual Wt: 300 lb -- per office visit  record January 2013 % Usual Wt: 103%  Body mass index is 43.18 kg/(m^2).  Food/Nutrition Related Hx: no triggers per admission nutrition screen  Labs:  CMP     Component Value Date/Time   NA 140 09/11/2011 0356   K 3.9 09/11/2011 0356   CL 106 09/11/2011 0356   CO2 25 09/11/2011 0356   GLUCOSE 111* 09/11/2011 0356   BUN 23 09/11/2011 0356   CREATININE 1.03 09/11/2011 0356   CALCIUM 7.8* 09/11/2011 0356   PROT 6.5 09/07/2011 1647   ALBUMIN 3.2* 09/07/2011 1647   AST 14 09/07/2011 1647   ALT 16 09/07/2011 1647   ALKPHOS 56 09/07/2011 1647   BILITOT 0.2* 09/07/2011 1647   GFRNONAA 71* 09/11/2011 0356   GFRAA 82* 09/11/2011 0356     Intake/Output Summary (Last 24 hours) at 09/11/11 1205 Last data filed at 09/11/11 1106  Gross per 24 hour  Intake 2103.23 ml  Output   2640 ml  Net -536.77 ml    CBG (last 3)   Basename 09/11/11 0751 09/11/11 0554 09/11/11 0353  GLUCAP 109* 125* 111*    Diet Order: NPO  Supplements/Tube Feeding: N/A  IVF:    sodium chloride Last Rate: Stopped (09/11/11 0915)  sodium chloride   sodium chloride   DOPamine Last Rate: 3 mcg/kg/min (09/11/11 1100)  insulin  (NOVOLIN-R) infusion Last Rate: Stopped (09/11/11 0015)  lactated ringers Last Rate: 20 mL/hr at 09/11/11 0600  nitroGLYCERIN   norepinephrine (LEVOPHED) Adult infusion Last Rate: Stopped (09/11/11 0300)  DISCONTD: amiodarone (NEXTERONE PREMIX) 360 mg/200 mL dextrose   DISCONTD: amiodarone (NEXTERONE PREMIX) 360 mg/200 mL dextrose   DISCONTD: dexmedetomidine (PRECEDEX) IV infusion Last Rate: 0.5 mcg/kg/hr (09/11/11 0600)  DISCONTD: phenylephrine (NEO-SYNEPHRINE) Adult infusion Last Rate: 5 mcg/min (09/09/11 2015)    Estimated Nutritional Needs:   Kcal: 2500 Protein: 140-150 gm Fluid: 2.5 L  RD unable to obtain nutrition hx from pt -- intubated and sedated; s/p right carotid endarterectomy 4/16, aorta-innominate artery bypass & CABG 4/17; OGT in place; noted pt with several surgical incisions; pt at nutrition risk due to post-op state and inability to eat with VDRF; if prolonged intubation period expected, would initiate EN support; please see recommendations below   NUTRITION DIAGNOSIS: -Inadequate oral intake (NI-2.1).  Status: Ongoing  RELATED TO: inability to eat, VDRF  AS EVIDENCE BY: NPO status  MONITORING/EVALUATION(Goals): Goal: Initiate EN support within next 24-48 hours; EN to provide 60-70% of estimated calorie needs (22-25 kcals/kg ideal body weight) and 100% of estimated protein needs, based on ASPEN guidelines for permissive underfeeding in critically ill obese individuals Monitor: EN initiation, respiratory status, weight, labs, I/O's  EDUCATION NEEDS: -No education needs identified at this time  INTERVENTION:  If EN started, recommend initiation of Promote formula at 20 ml/hr and increase 10 ml every 4 hours until goal rate of 60 ml/hr with Prostat liquid protein 30 ml QID via tube to provide 1728 total kcals (69% of estimated kcal needs), 150 gm protein (100% of estimated protein needs), 1208 ml of free water  RD to follow for nutrition care plan  Dietitian #:  [id number]  DOCUMENTATION CODES Per approved criteria  -Morbid Obesity    Lady Deutscher 09/11/2011, 12:00 PM

## 2011-09-12 ENCOUNTER — Inpatient Hospital Stay (HOSPITAL_COMMUNITY): Payer: Medicare Other

## 2011-09-12 LAB — CBC
HCT: 24 % — ABNORMAL LOW (ref 39.0–52.0)
Hemoglobin: 8 g/dL — ABNORMAL LOW (ref 13.0–17.0)
RBC: 2.67 MIL/uL — ABNORMAL LOW (ref 4.22–5.81)
WBC: 8.6 10*3/uL (ref 4.0–10.5)

## 2011-09-12 LAB — GLUCOSE, CAPILLARY
Glucose-Capillary: 110 mg/dL — ABNORMAL HIGH (ref 70–99)
Glucose-Capillary: 124 mg/dL — ABNORMAL HIGH (ref 70–99)
Glucose-Capillary: 180 mg/dL — ABNORMAL HIGH (ref 70–99)

## 2011-09-12 LAB — BASIC METABOLIC PANEL
Chloride: 104 mEq/L (ref 96–112)
GFR calc Af Amer: >90 mL/min (ref >90)
GFR calc non Af Amer: 82 mL/min — ABNORMAL LOW (ref >90)
Potassium: 3.8 mEq/L (ref 3.5–5.1)
Sodium: 141 mEq/L (ref 135–145)

## 2011-09-12 MED ORDER — ONDANSETRON HCL 4 MG/2ML IJ SOLN
4.0000 mg | INTRAMUSCULAR | Status: DC | PRN
  Administered 2011-09-12: 4 mg via INTRAVENOUS

## 2011-09-12 MED ORDER — PROMETHAZINE HCL 25 MG/ML IJ SOLN: 12.5000 mg | Freq: Four times a day (QID) | INTRAMUSCULAR | Status: DC | PRN

## 2011-09-12 MED ORDER — PROMETHAZINE HCL 25 MG/ML IJ SOLN
25.0000 mg | Freq: Four times a day (QID) | INTRAMUSCULAR | Status: DC | PRN
  Administered 2011-09-12: 12.5 mg via INTRAVENOUS
  Filled 2011-09-12: qty 1

## 2011-09-12 NOTE — Progress Notes (Addendum)
VASCULAR AND VEIN SURGERY POST - OP CEA PROGRESS NOTE  Date of Surgery: 09/09/2011 Surgeon: Surgeon(s): Gaye Pollack, MD Elam Dutch, MD 3 Days Post-Op right Carotid Endarterectomy .  HPI: [name] is a 71 y.o. male who is 3 Days Post-Op right Carotid Endarterectomy . Patient is doing well. Patient denies headache; Patient denies difficulty swallowing; denies weakness in upper or lower extremities; Pt. C/O nausea this am after soda and crackers  Significant Diagnostic Studies: CBC Lab Results  Component Value Date   WBC 8.6 09/12/2011   HGB 8.0* 09/12/2011   HCT 24.0* 09/12/2011   MCV 89.9 09/12/2011   PLT 103* 09/12/2011    BMET    Component Value Date/Time   NA 141 09/12/2011 0455   K 3.8 09/12/2011 0455   CL 104 09/12/2011 0455   CO2 32 09/12/2011 0455   GLUCOSE 126* 09/12/2011 0455   BUN 31* 09/12/2011 0455   CREATININE 0.94 09/12/2011 0455   CALCIUM 8.0* 09/12/2011 0455   GFRNONAA 82* 09/12/2011 0455   GFRAA >90 09/12/2011 0455    COAG Lab Results  Component Value Date   INR 1.48 09/09/2011   INR 1.63* 09/09/2011   INR 1.00 09/07/2011   No results found for this basename: PTT      Intake/Output Summary (Last 24 hours) at 09/12/11 0756 Last data filed at 09/12/11 0700  Gross per 24 hour  Intake 1198.5 ml  Output   2170 ml  Net -971.5 ml    Physical Exam:  BP Readings from Last 3 Encounters:  09/12/11 147/58  09/12/11 147/58  09/07/11 162/59   Temp Readings from Last 3 Encounters:  09/12/11 98.7 F (37.1 C) Oral  09/12/11 98.7 F (37.1 C) Oral  09/07/11 97.7 F (36.5 C) Oral   SpO2 Readings from Last 3 Encounters:  09/12/11 91%  09/12/11 91%  09/07/11 96%   Pulse Readings from Last 3 Encounters:  09/12/11 90  09/12/11 90  09/07/11 57    Pt is A&O x 3 Speech is fluent right Neck Wound is healing well with mod swelling which is soft Patient with Negative tongue deviation and Negative facial droop Pt has good and equal strength in  all extremities  Assessment: DELONE HECKLE is a 71 y.o. male is S/P Right Carotid endarterectomy Post-op nausea Post-op acute blood loss anemia - per TCTS Neuro exam stable and intact   Plan: per cardiology Follow-up in 2 weeks with Dr. Oneida Alar - office will arrange  Richrd Prime [id number] 09/12/2011 7:56 AM  Right neck healing well Neuro UE/LE 5/5 Hands well perfused no palpable radial bilat  Intermittent nausea ? Secondary to pain med or dopamine Increase frequency of Zofran Phenergan as backup Continue to mobilize  Ruta Hinds, MD [facility] [place] Office: [phone] Pager: [phone]

## 2011-09-12 NOTE — Evaluation (Signed)
Physical Therapy Evaluation Patient Details Name: [name] MRN: [id number] DOB: [birth year] Today's Date: 09/12/2011 Time: [id number] PT Time Calculation (min): 55 min  PT Assessment / Plan / Recommendation Clinical Impression  Patient s/p CEA and CABG with decr mobility secondary to incr pain and decr endurance.  Will benefit from Rehab c/s as patient needs to have sternal precautions reinforced to protect incision.  MD:  Please order Rehab consult if you agree.     PT Assessment  Patient needs continued PT services    Follow Up Recommendations  Inpatient Rehab;Supervision/Assistance - 24 hour    Equipment Recommendations  Defer to next venue    Frequency Min 3X/week    Precautions / Restrictions Precautions Precautions: Sternal;Fall Restrictions Weight Bearing Restrictions: No   Pertinent Vitals/Pain VSS/ some chest incision pain      Mobility  Bed Mobility Bed Mobility: Sit to Sidelying Left;Sit to Supine;Scooting to [facility] Sit to Supine: 1: +2 Total assist;HOB flat Sit to Supine: Patient Percentage: 40% Sit to Sidelying Left: 1: +2 Total assist;With rail;HOB flat Sit to Sidelying Left: Patient Percentage: 40% Scooting to HOB: 1: +2 Total assist Scooting to [facility]: Patient Percentage: 10% Details for Bed Mobility Assistance: Patient needs constant cues for sternal precautions with all bed mobility.  Transfers Transfers: Sit to Stand;Stand to Sit;Stand Pivot Transfers Sit to Stand: 1: +2 Total assist;Without upper extremity assist;From chair/3-in-1 Sit to Stand: Patient Percentage: 50% Stand to Sit: 1: +2 Total assist;To bed;To chair/3-in-1;Without upper extremity assist Stand to Sit: Patient Percentage: 50% Stand Pivot Transfers: 1: +2 Total assist Stand Pivot Transfers: Patient Percentage: 70% Details for Transfer Assistance: Patient needed cues for hand placement with sit to stand constantly.  Patient encouraged to use hands on knees or hug pillow.  Patient able  to acheive fairly full stand and pivoted with wide BOS and assist to move RW and sequence steps.  Slightly flexed posture.   Ambulation/Gait Ambulation/Gait Assistance: Not tested (comment) Stairs: No Wheelchair Mobility Wheelchair Mobility: No    Exercises General Exercises - Lower Extremity Ankle Circles/Pumps: AROM;Both;5 reps;Supine Short Arc Quad: AROM;Both;5 reps;Supine Heel Slides: AROM;Both;5 reps;Supine   PT Goals Acute Rehab PT Goals PT Goal Formulation: With patient Time For Goal Achievement: 09/26/11 Potential to Achieve Goals: Good Pt will Roll Supine to Left Side: with supervision PT Goal: Rolling Supine to Left Side - Progress: Goal set today Pt will go Supine/Side to Sit: with supervision PT Goal: Supine/Side to Sit - Progress: Goal set today Pt will go Sit to Supine/Side: with min assist PT Goal: Sit to Supine/Side - Progress: Goal set today Pt will go Sit to Stand: with supervision;without upper extremity assist;with cues (comment type and amount) PT Goal: Sit to Stand - Progress: Goal set today Pt will go Stand to Sit: with supervision;without upper extremity assist;with cues (comment type and amount) PT Goal: Stand to Sit - Progress: Goal set today Pt will Transfer Bed to Chair/Chair to Bed: with modified independence PT Transfer Goal: Bed to Chair/Chair to Bed - Progress: Goal set today Pt will Ambulate: 51 - 150 feet;with supervision;with least restrictive assistive device PT Goal: Ambulate - Progress: Goal set today Pt will Go Up / Down Stairs: Flight;with min assist;with least restrictive assistive device PT Goal: Up/Down Stairs - Progress: Goal set today Pt will Perform Home Exercise Program: with supervision, verbal cues required/provided PT Goal: Perform Home Exercise Program - Progress: Goal set today  Visit Information  Last PT Received On: 09/12/11 Assistance Needed: +2  Subjective Data  Subjective: "I need to know how I am going to  move." Patient Stated Goal: To be independent   Prior [place] Lives With: Spouse Available Help at Discharge: Family;Available 24 hours/day (wife works 3 days a week) Type of Home: House Home Access: Stairs to enter [employer] of Steps: 12 Entrance Stairs-Rails: Can reach both Home Layout: Two level Bathroom Shower/Tub: Multimedia programmer: Handicapped height Home Adaptive Equipment: Straight cane;Walker - rolling Additional Comments: independent with cane but was having some falls.   Prior Function Level of Independence: Independent with assistive device(s) Able to Take Stairs?: Yes Driving: Yes Vocation: Full time employment Communication Communication: No difficulties    Cognition  Overall Cognitive Status: Impaired Area of Impairment: Attention;Memory;Following commands;Safety/judgement;Awareness of deficits;Problem solving Arousal/Alertness: Awake/alert Orientation Level: Appears intact for tasks assessed Behavior During Session: [facility] for tasks performed Current Attention Level: Focused Attention - Other Comments: for up to a minute Memory: Decreased recall of precautions Memory Deficits: poor recall of sternal precautions.  Constant need to cue patient for simple tasks and needs repetition constantly for precautions. Following Commands: Follows one step commands consistently;Follows one step commands with increased time;Follows multi-step commands inconsistently;Follows multi-step commands with increased time Safety/Judgement: Decreased awareness of safety precautions;Decreased safety judgement for tasks assessed Awareness of Deficits: Poor safety awareness    Extremity/Trunk Assessment Right Upper Extremity Assessment RUE ROM/Strength/Tone: WFL for tasks assessed RUE Sensation: WFL - Light Touch RUE Coordination: WFL - gross/fine motor Left Upper Extremity Assessment LUE ROM/Strength/Tone: WFL for tasks assessed LUE Sensation:  WFL - Light Touch LUE Coordination: WFL - gross/fine motor Right Lower Extremity Assessment RLE ROM/Strength/Tone: WFL for tasks assessed RLE Sensation: WFL - Light Touch RLE Coordination: WFL - gross/fine motor Left Lower Extremity Assessment LLE ROM/Strength/Tone: WFL for tasks assessed LLE Sensation: WFL - Light Touch LLE Coordination: WFL - gross/fine motor Trunk Assessment Trunk Assessment: Normal   Balance Balance Balance Assessed: Yes Static Standing Balance Static Standing - Balance Support: Bilateral upper extremity supported;During functional activity Static Standing - Level of Assistance: 1: +2 Total assist (pt = 75%) Static Standing - Comment/# of Minutes: Stood 2 minutes to be cleaned of stool with assist.    End of Session PT - End of Session Equipment Utilized During Treatment: Gait belt Activity Tolerance: Patient limited by fatigue Patient left: with call bell/phone within reach;in bed;with nursing in room;with family/visitor present Nurse Communication: Mobility status   INGOLD,Zoriyah Scheidegger 09/12/2011, 4:48 PM  [facility] [phone] (pager)

## 2011-09-12 NOTE — Progress Notes (Addendum)
   CARDIOTHORACIC SURGERY PROGRESS NOTE   R3 Days Post-Op Procedure(s) (LRB): ENDARTERECTOMY CAROTID (Right) CORONARY ARTERY BYPASS GRAFTING (CABG) (N/A) AORTA -INNOMIATE BYPASS ()  Subjective: Some shortness of breath overnight.  Mild nausea.  Otherwise feels okay.  Dopamine still on due to marginal BP.  Objective: Vital signs: BP Readings from Last 1 Encounters:  09/12/11 124/53   Pulse Readings from Last 1 Encounters:  09/12/11 66   Resp Readings from Last 1 Encounters:  09/12/11 30   Temp Readings from Last 1 Encounters:  09/12/11 98.7 F (37.1 C) Oral    Hemodynamics: PAP: (44-47)/(23-25) 47/25 mmHg  Physical Exam:  Rhythm:   sinus  Breath sounds: clear  Heart sounds:  RRR  Incisions:  Clean and dry  Abdomen:  soft  Extremities:  Warm, swollen   Intake/Output from previous day: 04/19 0701 - 04/20 0700 In: 1198.5 [P.O.:360; I.V.:712.5; NG/GT:60; IV Piggyback:66] Out: 2170 [Urine:2080; Drains:90] Intake/Output this shift: Total I/O In: 35.5 [I.V.:27.5; IV Piggyback:8] Out: 50 [Urine:50]  Lab Results:  [facility] 09/12/11 0455 09/11/11 0356  WBC 8.6 8.3  HGB 8.0* 8.3*  HCT 24.0* 24.4*  PLT 103* 91*   BMET:  Basename 09/12/11 0455 09/11/11 0356  NA 141 140  K 3.8 3.9  CL 104 106  CO2 32 25  GLUCOSE 126* 111*  BUN 31* 23  CREATININE 0.94 1.03  CALCIUM 8.0* 7.8*    CBG (last 3)   Basename 09/12/11 0717 09/12/11 0418 09/11/11 2335  GLUCAP 110* 124* 180*   ABG    Component Value Date/Time   PHART 7.414 09/11/2011 1231   HCO3 23.9 09/11/2011 1231   TCO2 25 09/11/2011 1231   ACIDBASEDEF 1.0 09/11/2011 1231   O2SAT 91.0 09/11/2011 1231   CXR: RADIOLOGY REPORT*  Clinical Data: Postop cardiac surgery  PORTABLE CHEST - 1 VIEW  Comparison: 09/11/2011; 09/10/2011; 09/09/2011  Findings:  Grossly unchanged enlarged cardiac silhouette and mediastinal  contours post median sternotomy. Interval extubation and removal  of enteric tube. Interval  removal of left jugular approach central  venous catheter with vascular sheath tip remaining over the left  innominate vein. Interval removal of left-sided chest tube and  mediastinal drains. No definite pneumothorax. Overall improved  inspiration with minimal bibasilar heterogeneous opacities.  Suspect small left-sided pleural effusion. Unchanged bones.  IMPRESSION:  1. Interval removal of support apparatus. No definite  pneumothorax.  2. Improved inspiratory effort with persistent bibasilar opacities  favored to represent atelectasis.  Original Report Authenticated By: Rachel Moulds, M.D.   Assessment/Plan: S/P Procedure(s) (LRB): ENDARTERECTOMY CAROTID (Right) CORONARY ARTERY BYPASS GRAFTING (CABG) (N/A) AORTA -INNOMIATE BYPASS ()  Stable POD3 Expected post op acute blood loss anemia, mild, stable Expected post op volume excess, moderate Marginal BP - still on low dose dopamine Morbid obesity   Mobilize  Wean dopamine as tolerated  Diuresis  PT consult to assist with mobility  Karimah Winquist H 09/12/2011 8:55 AM

## 2011-09-12 NOTE — Progress Notes (Signed)
TCTS BRIEF SICU PROGRESS NOTE  3 Days Post-Op  S/P Procedure(s) (LRB): ENDARTERECTOMY CAROTID (Right) CORONARY ARTERY BYPASS GRAFTING (CABG) (N/A) AORTA -INNOMIATE BYPASS ()   Stable day Nauseated earlier.  Feels better now.  Plan: Continue current plan  Rexene Alberts 09/12/2011 5:37 PM

## 2011-09-12 NOTE — Progress Notes (Signed)
SUBJECTIVE:  POD#3 from right CEA and CABG doing well  OBJECTIVE:   Vitals:   Filed Vitals:   09/12/11 0500 09/12/11 0600 09/12/11 0700 09/12/11 0729  BP: 148/64 163/58 147/58   Pulse: 90 90 90   Temp:    98.7 F (37.1 C)  TempSrc:    Oral  Resp: 28 26 27    Height:      Weight: 139.7 kg (307 lb 15.7 oz)     SpO2: 94% 93% 91%    I&O's:   Intake/Output Summary (Last 24 hours) at 09/12/11 0805 Last data filed at 09/12/11 0700  Gross per 24 hour  Intake   1116 ml  Output   2070 ml  Net   -954 ml   TELEMETRY: Reviewed telemetry pt in NSR:     PHYSICAL EXAM General: Well developed, well nourished, in no acute distress Lungs:   Clear bilaterally to auscultation and percussion. Heart:   HRRR S1 S2 Pulses are 2+ & equal.. Abdomen: Bowel sounds are positive, abdomen soft and non-tender without masses Neuro: Alert and oriented X 3. Psych:  Good affect, responds appropriately   LABS: Basic Metabolic Panel:  Basename 09/12/11 0455 09/11/11 0356 09/10/11 1724 09/10/11 0316  NA 141 140 -- --  K 3.8 3.9 -- --  CL 104 106 -- --  CO2 32 25 -- --  GLUCOSE 126* 111* -- --  BUN 31* 23 -- --  CREATININE 0.94 1.03 -- --  CALCIUM 8.0* 7.8* -- --  MG -- -- 2.6* 2.6*  PHOS -- -- -- --   Liver Function Tests: No results found for this basename: AST:2,ALT:2,ALKPHOS:2,BILITOT:2,PROT:2,ALBUMIN:2 in the last 72 hours No results found for this basename: LIPASE:2,AMYLASE:2 in the last 72 hours CBC:  Basename 09/12/11 0455 09/11/11 0356  WBC 8.6 8.3  NEUTROABS -- --  HGB 8.0* 8.3*  HCT 24.0* 24.4*  MCV 89.9 87.5  PLT 103* 91*    Lab Results  Component Value Date   INR 1.48 09/09/2011   INR 1.63* 09/09/2011   INR 1.00 09/07/2011    RADIOLOGY: Dg Chest 2 View  09/07/2011  *RADIOLOGY REPORT*  Clinical Data: Preoperative respiratory films.  CHEST - 2 VIEW  Comparison: Chest 06/17/2011 and 03/09/2011.  Findings: Lungs are clear.  No pneumothorax or pleural effusion. Heart size is  upper normal.  IMPRESSION: No acute disease.  Original Report Authenticated By: Arvid Right. D'ALESSIO, M.D.   Dg Chest Portable 1 View In Am  09/12/2011  *RADIOLOGY REPORT*  Clinical Data: Postop cardiac surgery  PORTABLE CHEST - 1 VIEW  Comparison: 09/11/2011; 09/10/2011; 09/09/2011  Findings:  Grossly unchanged enlarged cardiac silhouette and mediastinal contours post median sternotomy.  Interval extubation and removal of enteric tube.  Interval removal of left jugular approach central venous catheter with vascular sheath tip remaining over the left innominate vein.  Interval removal of left-sided chest tube and mediastinal drains.  No definite pneumothorax.  Overall improved inspiration with minimal bibasilar heterogeneous opacities. Suspect small left-sided pleural effusion.  Unchanged bones.  IMPRESSION: 1.  Interval removal of support apparatus.  No definite pneumothorax. 2.  Improved inspiratory effort with persistent bibasilar opacities favored to represent atelectasis.  Original Report Authenticated By: Rachel Moulds, M.D.   Dg Chest Port 1 View  09/11/2011  *RADIOLOGY REPORT*  Clinical Data: Recent open heart surgery.  PORTABLE CHEST - 1 VIEW  Comparison: 09/10/2011  Findings: Endotracheal tube, NG tube, Swan-Ganz catheter, and chest tubes remain. No pneumothorax.  Small bilateral pleural  effusions. Pulmonary vascularity is normal.  IMPRESSION: No significant change.  No pneumothorax.  Original Report Authenticated By: Larey Seat, M.D.   Dg Chest Portable 1 View In Am  09/10/2011  *RADIOLOGY REPORT*  Clinical Data: Postop  PORTABLE CHEST - 1 VIEW  Comparison: 09/09/2011  Findings: Cardiomediastinal silhouette is stable.  Again noted status post CABG.  Endotracheal tube in place with tip 3.6 cm above the carina.  Stable left IJ Swan-Ganz catheter position.  Stable left chest tube position and mediastinal drain.  Central mild vascular congestion without convincing edema.  Bilateral basilar  atelectasis or infiltrate right greater than left.  Question small right pleural effusion.  IMPRESSION: Again noted status post CABG.  Endotracheal tube in place with tip 3.6 cm above the carina.  Stable left IJ Swan-Ganz catheter position.  Stable left chest tube position and mediastinal drain. Central mild vascular congestion without convincing edema. Bilateral basilar atelectasis or infiltrate right greater than left.  Question small right pleural effusion.  Original Report Authenticated By: Lahoma Crocker, M.D.   Dg Chest Portable 1 View  09/09/2011  *RADIOLOGY REPORT*  Clinical Data: Status post CABG.  PORTABLE CHEST - 1 VIEW  Comparison: PA and lateral chest 09/07/2011.  Findings: Endotracheal tube is in place with tip in good position just below the clavicular heads.  NG tube courses into the stomach and below the inferior margin of the film.  A left IJ approach Swan- Ganz catheter is in place with the tip of the Swan just within the right main pulmonary artery.  Left chest tube is identified.  The patient has some basilar atelectasis, greater on the left.  No pneumothorax.  No pulmonary edema.  Cardiomegaly.  IMPRESSION:  1.  Support apparatus in good position.  No pneumothorax. 2.  Basilar atelectasis, greater on the left.  Original Report Authenticated By: Arvid Right. D'ALESSIO, M.D.      ASSESSMENT:  1.  Severe multivessel ASCAD s/p CABG  2.  Carotid artery stenosis s/p Right CEA 3.  Post op afib with no reoccurrence - NSR underlying a paced rhythm at 60bpm  PLAN:   1.  Continue current med therapy 2.  Follow for afib  Sueanne Margarita, MD  09/12/2011  8:05 AM

## 2011-09-13 ENCOUNTER — Inpatient Hospital Stay (HOSPITAL_COMMUNITY): Payer: Medicare Other

## 2011-09-13 LAB — GLUCOSE, CAPILLARY
Glucose-Capillary: 117 mg/dL — ABNORMAL HIGH (ref 70–99)
Glucose-Capillary: 103 mg/dL — ABNORMAL HIGH (ref 70–99)
Glucose-Capillary: 142 mg/dL — ABNORMAL HIGH (ref 70–99)
Glucose-Capillary: 123 mg/dL — ABNORMAL HIGH (ref 70–99)

## 2011-09-13 LAB — BASIC METABOLIC PANEL
BUN: 43 mg/dL — ABNORMAL HIGH (ref 6–23)
CO2: 30 mEq/L (ref 19–32)
Chloride: 104 mEq/L (ref 96–112)
Glucose, Bld: 116 mg/dL — ABNORMAL HIGH (ref 70–99)
Potassium: 4 mEq/L (ref 3.5–5.1)

## 2011-09-13 LAB — CBC
HCT: 22.8 % — ABNORMAL LOW (ref 39.0–52.0)
Hemoglobin: 7.4 g/dL — ABNORMAL LOW (ref 13.0–17.0)
MCHC: 32.5 g/dL (ref 30.0–36.0)
RBC: 2.48 MIL/uL — ABNORMAL LOW (ref 4.22–5.81)
WBC: 6.8 10*3/uL (ref 4.0–10.5)

## 2011-09-13 MED ORDER — LABETALOL HCL 5 MG/ML IV SOLN
10.0000 mg | INTRAVENOUS | Status: DC | PRN
  Administered 2011-09-14 (×5): 10 mg via INTRAVENOUS
  Filled 2011-09-13 (×3): qty 4

## 2011-09-13 NOTE — Progress Notes (Signed)
Physical Therapy Cancellation (Late entry for 10:00 a.m.) Patient Details Name: [name] MRN: [id number] DOB: 30-Apr-1941 Today's Date: 09/13/2011 Time:  -     Reason Eval/Treat Not Completed: Pt just walked with RN and just back to bed.                 [name] 09/13/2011, 4:44 PM

## 2011-09-13 NOTE — Progress Notes (Signed)
[facility]  Subjective  - Nausea improved overall feels better  Objective 135/42 53 97.6 F (36.4 C) (Oral) 18 97%  Intake/Output Summary (Last 24 hours) at 09/13/11 0904 Last data filed at 09/13/11 0806  Gross per 24 hour  Intake  365.5 ml  Output   1095 ml  Net -729.5 ml   Neck incision healing Neuro intact  Assessment/Planning: Overall improved Continue to mobilize Blood loss anemia per Cardiac Surgery  Karishma Unrein E 09/13/2011 9:04 AM --  Laboratory Lab Results:  Basename 09/13/11 0555 09/12/11 0455  WBC 6.8 8.6  HGB 7.4* 8.0*  HCT 22.8* 24.0*  PLT 138* 103*   BMET  Basename 09/13/11 0555 09/12/11 0455  NA 141 141  K 4.0 3.8  CL 104 104  CO2 30 32  GLUCOSE 116* 126*  BUN 43* 31*  CREATININE 1.10 0.94  CALCIUM 7.8* 8.0*    COAG Lab Results  Component Value Date   INR 1.48 09/09/2011   INR 1.63* 09/09/2011   INR 1.00 09/07/2011   No results found for this basename: PTT

## 2011-09-13 NOTE — Progress Notes (Signed)
SUBJECTIVE:  Sitting up in chair  OBJECTIVE:   Vitals:   Filed Vitals:   09/13/11 0500 09/13/11 0600 09/13/11 0700 09/13/11 0743  BP: 156/41 125/42 135/44   Pulse: 57 51 52   Temp:    97.6 F (36.4 C)  TempSrc:    Oral  Resp: 24 23 22    Height:      Weight: 142.1 kg (313 lb 4.4 oz)     SpO2: 91% 95% 98%    I&O's:   Intake/Output Summary (Last 24 hours) at 09/13/11 0754 Last data filed at 09/13/11 0600  Gross per 24 hour  Intake  420.5 ml  Output   1205 ml  Net -784.5 ml   TELEMETRY: Reviewed telemetry pt in NSR:     PHYSICAL EXAM General: Well developed, well nourished, in no acute distress Head: Eyes PERRLA, No xanthomas.   Normal cephalic and atramatic  Lungs:   Clear bilaterally to auscultation and percussion. Heart:   HRRR S1 S2 Pulses are 2+ & equal. Abdomen: Bowel sounds are positive, abdomen soft and non-tender without masses  Extremities:   1+ edema bilaterally Neuro: Alert and oriented X 3. Psych:  Good affect, responds appropriately   LABS: Basic Metabolic Panel:  Basename 09/13/11 0555 09/12/11 0455 09/10/11 1724  NA 141 141 --  K 4.0 3.8 --  CL 104 104 --  CO2 30 32 --  GLUCOSE 116* 126* --  BUN 43* 31* --  CREATININE 1.10 0.94 --  CALCIUM 7.8* 8.0* --  MG -- -- 2.6*  PHOS -- -- --   Liver Function Tests: No results found for this basename: AST:2,ALT:2,ALKPHOS:2,BILITOT:2,PROT:2,ALBUMIN:2 in the last 72 hours No results found for this basename: LIPASE:2,AMYLASE:2 in the last 72 hours CBC:  Basename 09/13/11 0555 09/12/11 0455  WBC 6.8 8.6  NEUTROABS -- --  HGB 7.4* 8.0*  HCT 22.8* 24.0*  MCV 91.9 89.9  PLT 138* 103*   Coag Panel:   Lab Results  Component Value Date   INR 1.48 09/09/2011   INR 1.63* 09/09/2011   INR 1.00 09/07/2011    RADIOLOGY: Dg Chest 2 View  09/07/2011  *RADIOLOGY REPORT*  Clinical Data: Preoperative respiratory films.  CHEST - 2 VIEW  Comparison: Chest 06/17/2011 and 03/09/2011.  Findings: Lungs are clear.  No  pneumothorax or pleural effusion. Heart size is upper normal.  IMPRESSION: No acute disease.  Original Report Authenticated By: Arvid Right. Luther Parody, M.D.   Dg Chest Port 1 View  09/13/2011  *RADIOLOGY REPORT*  Clinical Data: Postop (heart surgery)  PORTABLE CHEST - 1 VIEW  Comparison: 09/12/2011; 09/11/2011; 09/10/2011  Findings: Grossly unchanged enlarged cardiac silhouette and mediastinal contours post median sternotomy.  Stable position of support apparatus.  No definite pneumothorax.  Grossly unchanged bibasilar heterogeneous opacities.  No definite evidence of pulmonary edema.  Small bilateral pleural effusions are suspected. Grossly unchanged bones.  IMPRESSION: Unchanged bibasilar opacities favored to represent atelectasis.  Original Report Authenticated By: Rachel Moulds, M.D.   Dg Chest Portable 1 View In Am  09/12/2011  *RADIOLOGY REPORT*  Clinical Data: Postop cardiac surgery  PORTABLE CHEST - 1 VIEW  Comparison: 09/11/2011; 09/10/2011; 09/09/2011  Findings:  Grossly unchanged enlarged cardiac silhouette and mediastinal contours post median sternotomy.  Interval extubation and removal of enteric tube.  Interval removal of left jugular approach central venous catheter with vascular sheath tip remaining over the left innominate vein.  Interval removal of left-sided chest tube and mediastinal drains.  No definite pneumothorax.  Overall  improved inspiration with minimal bibasilar heterogeneous opacities. Suspect small left-sided pleural effusion.  Unchanged bones.  IMPRESSION: 1.  Interval removal of support apparatus.  No definite pneumothorax. 2.  Improved inspiratory effort with persistent bibasilar opacities favored to represent atelectasis.  Original Report Authenticated By: Rachel Moulds, M.D.   Dg Chest Port 1 View  09/11/2011  *RADIOLOGY REPORT*  Clinical Data: Recent open heart surgery.  PORTABLE CHEST - 1 VIEW  Comparison: 09/10/2011  Findings: Endotracheal tube, NG tube, Swan-Ganz  catheter, and chest tubes remain. No pneumothorax.  Small bilateral pleural effusions. Pulmonary vascularity is normal.  IMPRESSION: No significant change.  No pneumothorax.  Original Report Authenticated By: Larey Seat, M.D.   Dg Chest Portable 1 View In Am  09/10/2011  *RADIOLOGY REPORT*  Clinical Data: Postop  PORTABLE CHEST - 1 VIEW  Comparison: 09/09/2011  Findings: Cardiomediastinal silhouette is stable.  Again noted status post CABG.  Endotracheal tube in place with tip 3.6 cm above the carina.  Stable left IJ Swan-Ganz catheter position.  Stable left chest tube position and mediastinal drain.  Central mild vascular congestion without convincing edema.  Bilateral basilar atelectasis or infiltrate right greater than left.  Question small right pleural effusion.  IMPRESSION: Again noted status post CABG.  Endotracheal tube in place with tip 3.6 cm above the carina.  Stable left IJ Swan-Ganz catheter position.  Stable left chest tube position and mediastinal drain. Central mild vascular congestion without convincing edema. Bilateral basilar atelectasis or infiltrate right greater than left.  Question small right pleural effusion.  Original Report Authenticated By: Lahoma Crocker, M.D.   Dg Chest Portable 1 View  09/09/2011  *RADIOLOGY REPORT*  Clinical Data: Status post CABG.  PORTABLE CHEST - 1 VIEW  Comparison: PA and lateral chest 09/07/2011.  Findings: Endotracheal tube is in place with tip in good position just below the clavicular heads.  NG tube courses into the stomach and below the inferior margin of the film.  A left IJ approach Swan- Ganz catheter is in place with the tip of the Swan just within the right main pulmonary artery.  Left chest tube is identified.  The patient has some basilar atelectasis, greater on the left.  No pneumothorax.  No pulmonary edema.  Cardiomegaly.  IMPRESSION:  1.  Support apparatus in good position.  No pneumothorax. 2.  Basilar atelectasis, greater on the left.   Original Report Authenticated By: Arvid Right. D'ALESSIO, M.D.      ASSESSMENT:  1. Severe multivessel ASCAD s/p CABG  2. Carotid artery stenosis s/p Right CEA  3. Post op afib with no reoccurrence - NSR underlying a paced rhythm at 60bpm  4. Post op anemia 5.  Volume overload   PLAN:   1. Continue current med therapy  2. Follow for afib 3.  Continue Lasix   Sueanne Margarita, MD  09/13/2011  7:54 AM

## 2011-09-13 NOTE — Progress Notes (Signed)
TCTS BRIEF SICU PROGRESS NOTE  4 Days Post-Op  S/P Procedure(s) (LRB): ENDARTERECTOMY CAROTID (Right) CORONARY ARTERY BYPASS GRAFTING (CABG) (N/A) AORTA -INNOMIATE BYPASS ()   Stable day  Plan: Continue current plan  Rexene Alberts 09/13/2011 6:31 PM

## 2011-09-13 NOTE — Progress Notes (Addendum)
   CARDIOTHORACIC SURGERY PROGRESS NOTE   R4 Days Post-Op Procedure(s) (LRB): ENDARTERECTOMY CAROTID (Right) CORONARY ARTERY BYPASS GRAFTING (CABG) (N/A) AORTA -INNOMIATE BYPASS ()  Subjective: Feels okay.  No SOB.  Difficult to mobilize.  Objective: Vital signs: BP Readings from Last 1 Encounters:  09/13/11 135/42   Pulse Readings from Last 1 Encounters:  09/13/11 53   Resp Readings from Last 1 Encounters:  09/13/11 18   Temp Readings from Last 1 Encounters:  09/13/11 97.6 F (36.4 C) Oral    Hemodynamics:    Physical Exam:  Rhythm:   sinus  Breath sounds: clear  Heart sounds:  RRR  Incisions:  Clean and dry  Abdomen:  soft  Extremities:  Warm, well perfused.  JP drain in right thigh still draining serosanguinous fluid which is also draining through incision   Intake/Output from previous day: 04/20 0701 - 04/21 0700 In: 420.5 [P.O.:200; I.V.:202.5; IV Piggyback:18] Out: [id number] [Urine:1085; Drains:120] Intake/Output this shift: Total I/O In: 8 [IV Piggyback:8] Out: 40 [Urine:40]  Lab Results:  [facility] 09/13/11 0555 09/12/11 0455  WBC 6.8 8.6  HGB 7.4* 8.0*  HCT 22.8* 24.0*  PLT 138* 103*   BMET:  Basename 09/13/11 0555 09/12/11 0455  NA 141 141  K 4.0 3.8  CL 104 104  CO2 30 32  GLUCOSE 116* 126*  BUN 43* 31*  CREATININE 1.10 0.94  CALCIUM 7.8* 8.0*    CBG (last 3)   Basename 09/13/11 0745 09/13/11 0320 09/12/11 2339  GLUCAP 103* 117* 117*   ABG    Component Value Date/Time   PHART 7.414 09/11/2011 1231   HCO3 23.9 09/11/2011 1231   TCO2 25 09/11/2011 1231   ACIDBASEDEF 1.0 09/11/2011 1231   O2SAT 91.0 09/11/2011 1231   CXR: *RADIOLOGY REPORT*  Clinical Data: Postop (heart surgery)  PORTABLE CHEST - 1 VIEW  Comparison: 09/12/2011; 09/11/2011; 09/10/2011  Findings: Grossly unchanged enlarged cardiac silhouette and  mediastinal contours post median sternotomy. Stable position of  support apparatus. No definite pneumothorax. Grossly  unchanged  bibasilar heterogeneous opacities. No definite evidence of  pulmonary edema. Small bilateral pleural effusions are suspected.  Grossly unchanged bones.  IMPRESSION:  Unchanged bibasilar opacities favored to represent atelectasis.  Original Report Authenticated By: Rachel Moulds, M.D.   Assessment/Plan: S/P Procedure(s) (LRB): ENDARTERECTOMY CAROTID (Right) CORONARY ARTERY BYPASS GRAFTING (CABG) (N/A) AORTA -INNOMIATE BYPASS ()  Overall stable Expected post op acute blood loss anemia, slightly worse Expected post op volume excess, diuresing Right thigh wound still draining Morbid obesity with somewhat limited mobility Type II diabetes mellitus, glycemic control excellent   Mobilize  Diuresis  Watch Hgb  Leave drain until output decreased  , H 09/13/2011 8:49 AM

## 2011-09-14 LAB — CBC
MCH: 30.5 pg (ref 26.0–34.0)
Platelets: 181 10*3/uL (ref 150–400)
RBC: 2.62 MIL/uL — ABNORMAL LOW (ref 4.22–5.81)
RDW: 15.6 % — ABNORMAL HIGH (ref 11.5–15.5)
WBC: 6.9 10*3/uL (ref 4.0–10.5)

## 2011-09-14 LAB — GLUCOSE, CAPILLARY
Glucose-Capillary: 110 mg/dL — ABNORMAL HIGH (ref 70–99)
Glucose-Capillary: 127 mg/dL — ABNORMAL HIGH (ref 70–99)
Glucose-Capillary: 115 mg/dL — ABNORMAL HIGH (ref 70–99)
Glucose-Capillary: 117 mg/dL — ABNORMAL HIGH (ref 70–99)
Glucose-Capillary: 147 mg/dL — ABNORMAL HIGH (ref 70–99)

## 2011-09-14 LAB — BASIC METABOLIC PANEL
Calcium: 8.1 mg/dL — ABNORMAL LOW (ref 8.4–10.5)
Creatinine, Ser: 0.85 mg/dL (ref 0.50–1.35)
GFR calc non Af Amer: 85 mL/min — ABNORMAL LOW (ref >90)
Sodium: 138 mEq/L (ref 135–145)

## 2011-09-14 MED ORDER — LABETALOL HCL 5 MG/ML IV SOLN
20.0000 mg | INTRAVENOUS | Status: DC | PRN
  Administered 2011-09-14: 10 mg via INTRAVENOUS
  Filled 2011-09-14: qty 4

## 2011-09-14 MED ORDER — TRAMADOL HCL 50 MG PO TABS
50.0000 mg | ORAL_TABLET | ORAL | Status: DC | PRN
  Administered 2011-09-15: 50 mg via ORAL
  Administered 2011-09-15 – 2011-09-19 (×2): 100 mg via ORAL
  Filled 2011-09-14 (×2): qty 2
  Filled 2011-09-14: qty 1

## 2011-09-14 MED ORDER — ONDANSETRON HCL 4 MG/2ML IJ SOLN: 4.0000 mg | Freq: Four times a day (QID) | INTRAMUSCULAR | Status: DC | PRN

## 2011-09-14 MED ORDER — ONDANSETRON HCL 4 MG PO TABS: 4.0000 mg | ORAL_TABLET | Freq: Four times a day (QID) | ORAL | Status: DC | PRN

## 2011-09-14 MED ORDER — POTASSIUM CHLORIDE CRYS ER 20 MEQ PO TBCR
EXTENDED_RELEASE_TABLET | ORAL | Status: AC
  Filled 2011-09-14: qty 2

## 2011-09-14 MED ORDER — MOVING RIGHT ALONG BOOK
Freq: Once | Status: DC
  Filled 2011-09-14: qty 1

## 2011-09-14 MED ORDER — BISACODYL 5 MG PO TBEC
10.0000 mg | DELAYED_RELEASE_TABLET | Freq: Every day | ORAL | Status: DC | PRN
  Administered 2011-09-23: 10 mg via ORAL
  Filled 2011-09-14: qty 2

## 2011-09-14 MED ORDER — POTASSIUM CHLORIDE CRYS ER 20 MEQ PO TBCR
20.0000 meq | EXTENDED_RELEASE_TABLET | Freq: Two times a day (BID) | ORAL | Status: DC
  Administered 2011-09-14 – 2011-09-17 (×6): 20 meq via ORAL
  Filled 2011-09-14 (×14): qty 1

## 2011-09-14 MED ORDER — SODIUM CHLORIDE 0.9 % IJ SOLN: 3.0000 mL | INTRAMUSCULAR | Status: DC | PRN

## 2011-09-14 MED ORDER — SODIUM CHLORIDE 0.9 % IV SOLN: 250.0000 mL | INTRAVENOUS | Status: DC | PRN

## 2011-09-14 MED ORDER — OXYCODONE HCL 5 MG PO TABS
5.0000 mg | ORAL_TABLET | ORAL | Status: DC | PRN
  Administered 2011-09-14: 5 mg via ORAL
  Filled 2011-09-14: qty 1

## 2011-09-14 MED ORDER — PANTOPRAZOLE SODIUM 40 MG PO TBEC
40.0000 mg | DELAYED_RELEASE_TABLET | Freq: Every day | ORAL | Status: DC
  Administered 2011-09-17 – 2011-09-21 (×5): 40 mg via ORAL
  Filled 2011-09-14 (×7): qty 1

## 2011-09-14 MED ORDER — POTASSIUM CHLORIDE 20 MEQ/15ML (10%) PO LIQD
ORAL | Status: AC
  Filled 2011-09-14: qty 15

## 2011-09-14 MED ORDER — POTASSIUM CHLORIDE CRYS ER 20 MEQ PO TBCR
40.0000 meq | EXTENDED_RELEASE_TABLET | Freq: Once | ORAL | Status: AC
  Administered 2011-09-14: 40 meq via ORAL

## 2011-09-14 MED ORDER — METOPROLOL TARTRATE 25 MG PO TABS
25.0000 mg | ORAL_TABLET | Freq: Two times a day (BID) | ORAL | Status: DC
  Administered 2011-09-14 – 2011-09-20 (×13): 25 mg via ORAL
  Filled 2011-09-14 (×19): qty 1

## 2011-09-14 MED ORDER — BISACODYL 10 MG RE SUPP: 10.0000 mg | Freq: Every day | RECTAL | Status: DC | PRN

## 2011-09-14 MED ORDER — ASPIRIN EC 325 MG PO TBEC
325.0000 mg | DELAYED_RELEASE_TABLET | Freq: Every day | ORAL | Status: DC
  Administered 2011-09-15 – 2011-09-29 (×14): 325 mg via ORAL
  Filled 2011-09-14 (×16): qty 1

## 2011-09-14 MED ORDER — INSULIN ASPART 100 UNIT/ML ~~LOC~~ SOLN
0.0000 [IU] | Freq: Three times a day (TID) | SUBCUTANEOUS | Status: DC
  Administered 2011-09-14 – 2011-09-15 (×3): 2 [IU] via SUBCUTANEOUS
  Administered 2011-09-15: 4 [IU] via SUBCUTANEOUS
  Administered 2011-09-15 – 2011-09-16 (×2): 2 [IU] via SUBCUTANEOUS
  Administered 2011-09-16: 4 [IU] via SUBCUTANEOUS
  Administered 2011-09-16 – 2011-09-17 (×5): 2 [IU] via SUBCUTANEOUS
  Administered 2011-09-18: 4 [IU] via SUBCUTANEOUS
  Administered 2011-09-18 (×2): 2 [IU] via SUBCUTANEOUS
  Administered 2011-09-19: 4 [IU] via SUBCUTANEOUS
  Administered 2011-09-19 – 2011-09-20 (×5): 2 [IU] via SUBCUTANEOUS
  Administered 2011-09-20 – 2011-09-21 (×3): 4 [IU] via SUBCUTANEOUS

## 2011-09-14 MED ORDER — SODIUM CHLORIDE 0.9 % IJ SOLN
3.0000 mL | Freq: Two times a day (BID) | INTRAMUSCULAR | Status: DC
  Administered 2011-09-14 – 2011-09-20 (×8): 3 mL via INTRAVENOUS

## 2011-09-14 MED ORDER — DOCUSATE SODIUM 100 MG PO CAPS
200.0000 mg | ORAL_CAPSULE | Freq: Every day | ORAL | Status: DC
  Administered 2011-09-15 – 2011-09-29 (×14): 200 mg via ORAL
  Filled 2011-09-14 (×14): qty 2

## 2011-09-14 MED ORDER — NITROPRUSSIDE SODIUM 25 MG/ML IV SOLN
0.2500 ug/kg/min | INTRAVENOUS | Status: DC
  Administered 2011-09-14: 0.5 ug/kg/min via INTRAVENOUS
  Filled 2011-09-14 (×3): qty 2

## 2011-09-14 MED ORDER — AMLODIPINE BESYLATE 5 MG PO TABS
5.0000 mg | ORAL_TABLET | Freq: Every day | ORAL | Status: DC
  Administered 2011-09-14 – 2011-09-17 (×3): 5 mg via ORAL
  Filled 2011-09-14 (×5): qty 1

## 2011-09-14 NOTE — Progress Notes (Signed)
5 Days Post-Op Procedure(s) (LRB): ENDARTERECTOMY CAROTID (Right) CORONARY ARTERY BYPASS GRAFTING (CABG) (N/A) AORTA -INNOMIATE BYPASS () Subjective: No complaints   Objective: Vital signs in last 24 hours: Temp:  [98.2 F (36.8 C)-99.2 F (37.3 C)] 98.2 F (36.8 C) (04/22 1148) Pulse Rate:  [57-100] 64  (04/22 1245) Cardiac Rhythm:  [-] Normal sinus rhythm (04/22 0800) Resp:  [18-36] 25  (04/22 1245) BP: (80-194)/(41-116) 80/59 mmHg (04/22 1245) SpO2:  [63 %-100 %] 92 % (04/22 1245) FiO2 (%):  [50 %] 50 % (04/22 0330) Weight:  [137.2 kg (302 lb 7.5 oz)] 137.2 kg (302 lb 7.5 oz) (04/22 0500)  Hemodynamic parameters for last 24 hours:    Intake/Output from previous day: 04/21 0701 - 04/22 0700 In: 616 [P.O.:600; IV Piggyback:16] Out: 3360 [Urine:3180; Drains:180] Intake/Output this shift: Total I/O In: 140.2 [I.V.:62.2; Other:70; IV Piggyback:8] Out: 975 [Urine:975]  General appearance: alert and cooperative Neurologic: intact Heart: regular rate and rhythm, S1, S2 normal, no murmur, click, rub or gallop Lungs: clear to auscultation bilaterally Extremities: edema moderate peripheral edema Wound: some serosanguinous drainage from leg incisions  Lab Results:  Basename 09/14/11 0508 09/13/11 0555  WBC 6.9 6.8  HGB 8.0* 7.4*  HCT 24.3* 22.8*  PLT 181 138*   BMET:  Basename 09/14/11 0508 09/13/11 0555  NA 138 141  K 3.8 4.0  CL 98 104  CO2 30 30  GLUCOSE 133* 116*  BUN 32* 43*  CREATININE 0.85 1.10  CALCIUM 8.1* 7.8*    PT/INR: No results found for this basename: LABPROT,INR in the last 72 hours ABG    Component Value Date/Time   PHART 7.414 09/11/2011 1231   HCO3 23.9 09/11/2011 1231   TCO2 25 09/11/2011 1231   ACIDBASEDEF 1.0 09/11/2011 1231   O2SAT 91.0 09/11/2011 1231   CBG (last 3)   Basename 09/14/11 1153 09/14/11 0739 09/14/11 0733  GLUCAP 178* 140* 115*    Assessment/Plan: S/P Procedure(s) (LRB): ENDARTERECTOMY CAROTID (Right) CORONARY ARTERY  BYPASS GRAFTING (CABG) (N/A) AORTA -INNOMIATE BYPASS () Mobilize Diuresis Diabetes control Plan for transfer to step-down: see transfer orders   LOS: 5 days    Tevis Dunavan K 09/14/2011

## 2011-09-14 NOTE — Progress Notes (Signed)
Paged Dr. Roxy Manns in referenced to pt increased BP (160-170/50).  Received order for Labetalol 10mg  IV Q1 hour as needed for SBP >150.  Will continue to monitor pt.

## 2011-09-14 NOTE — Progress Notes (Signed)
   CARE MANAGEMENT NOTE 09/14/2011  Patient:  HEBER, BRADFIELD   Account Number:  [id number]  Date Initiated:  09/14/2011  Documentation initiated by:  Espen Bethel  Subjective/Objective Assessment:   PT S/P CABG X3 ON 09/09/11.  PTA, PT INDEPENDENT, LIVES WITH SPOUSE.     Action/Plan:   WIFE AND CHILDREN TO PROVIDE 24 HR CARE AT DISCHARGE.  WILL FOLLOW FOR HOME NEEDS AS PT PROGRESSES.   Anticipated DC Date:  09/17/2011   Anticipated DC Plan:  [place]  CM consult      Choice offered to / List presented to:             Status of service:  In process, will continue to follow Medicare Important Message given?   (If response is "NO", the following Medicare IM given date fields will be blank) Date Medicare IM given:   Date Additional Medicare IM given:    Discharge Disposition:    Per UR Regulation:    If discussed at Long Length of Stay Meetings, dates discussed:    Comments:    Lionel December, RN, BSN Phone [phone]

## 2011-09-14 NOTE — Progress Notes (Signed)
UR Completed.  [name] [id number] 09/14/2011

## 2011-09-14 NOTE — Progress Notes (Signed)
Physical Therapy Treatment Patient Details Name: [name] MRN: [id number] DOB: 06-25-1940 Today's Date: 09/14/2011 Time: 1000-1027 PT Time Calculation (min): 27 min  PT Assessment / Plan / Recommendation Comments on Treatment Session  [name], aorta bypass and CEA with decr mobility secondary to weakness post surgery as well as post op confusion.  Continue to recommend REhab consult - MD - please order Rehab consult if you agree.  Thanks.    Follow Up Recommendations  Inpatient Rehab;Supervision/Assistance - 24 hour    Equipment Recommendations  Defer to next venue    Frequency Min 3X/week   Plan Discharge plan remains appropriate;Frequency remains appropriate    Precautions / Restrictions Precautions Precautions: Fall;Sternal Restrictions Weight Bearing Restrictions: No   Pertinent Vitals/Pain VSS/ No pain    Mobility  Bed Mobility Bed Mobility: Not assessed Sit to Supine: Not Tested (comment) Sit to Sidelying Left: Not Tested (comment) Scooting to [facility] Not tested (comment) Transfers Transfers: Sit to Stand;Stand to Sit Sit to Stand: 4: Min assist;Without upper extremity assist;From chair/3-in-1 Stand to Sit: 4: Min assist;Without upper extremity assist;To chair/3-in-1 Stand Pivot Transfers: Not tested (comment) Details for Transfer Assistance: Patient needed cues for hand placement with sit to stand constantly.  Patient encouraged to use hands on knees or hug pillow but needed max and constant cues secondary to confusion.  Patient able to stand fully upright but needs constant cuing to maintain full erect posture.   Ambulation/Gait Ambulation/Gait Assistance: 1: +2 Total assist (Needed 2 persons for safety) Ambulation/Gait: Patient Percentage: 70 Ambulation Distance (Feet): 175 Feet Assistive device: Rolling walker Ambulation/Gait Assistance Details: Patient needed cues to stay close to RW, take more equal steps and for fully erect posture. Gait Pattern:  Step-to pattern;Decreased stride length;Decreased hip/knee flexion - right;Decreased hip/knee flexion - left;Shuffle;Antalgic;Trunk flexed;Wide base of support Stairs: No Wheelchair Mobility Wheelchair Mobility: No    Exercises General Exercises - Lower Extremity Long Arc Quad: AROM;Both;10 reps;Seated   PT Goals Acute Rehab PT Goals PT Goal: Sit to Stand - Progress: Progressing toward goal PT Goal: Stand to Sit - Progress: Progressing toward goal PT Transfer Goal: Bed to Chair/Chair to Bed - Progress: Progressing toward goal PT Goal: Ambulate - Progress: Progressing toward goal PT Goal: Perform Home Exercise Program - Progress: Progressing toward goal  Visit Information  Last PT Received On: 09/14/11 Assistance Needed: +2    Subjective Data  Subjective: "I know how to do it.  You don't have to help me.  I pull up on this thing."  PT reinforcing sternal precautions constantly as patient is very confused.     Cognition  Overall Cognitive Status: Impaired Area of Impairment: Attention;Memory;Following commands;Safety/judgement;Awareness of deficits;Problem solving Arousal/Alertness: Awake/alert Orientation Level: Appears intact for tasks assessed Behavior During Session: [facility] for tasks performed Current Attention Level: Focused Attention - Other Comments: for up to a minute Memory: Decreased recall of precautions Memory Deficits: continues with poor recall of sternal precautions.  Constant need to cue patient for simple tasks and needs repetition constantly for precautions Following Commands: Follows one step commands consistently;Follows one step commands with increased time;Follows multi-step commands inconsistently;Follows multi-step commands with increased time Safety/Judgement: Decreased awareness of safety precautions;Decreased safety judgement for tasks assessed    Balance  Static Standing Balance Static Standing - Level of Assistance: Not tested (comment)  End of Session  PT - End of Session Equipment Utilized During Treatment: Gait belt Activity Tolerance: Patient limited by fatigue Patient left: in chair;with call bell/phone within reach;with nursing  in room;with family/visitor present Nurse Communication: Mobility status;Need for lift equipment;Precautions    INGOLD,Mylasia Vorhees 09/14/2011, 12:41 PM [facility] Acute Rehabilitation [phone] (pager)

## 2011-09-14 NOTE — Progress Notes (Signed)
Pt continues to improve Still issues with hypoxia requiring supplemental O2 Hypertension on intermittent Nipride  Neck some ecchymosis, no hematoma 2+ right radial pulse Neuro-UE/LE sensory intact  A/P Appreciate Dr Tamala Julian involvement with BP control O2 sat should cont to improve with diuresis and pulm toilet  Ruta Hinds, MD [facility]: [phone] Pager: [phone]  BMET    Component Value Date/Time   NA 138 09/14/2011 0508   K 3.8 09/14/2011 0508   CL 98 09/14/2011 0508   CO2 30 09/14/2011 0508   GLUCOSE 133* 09/14/2011 0508   BUN 32* 09/14/2011 0508   CREATININE 0.85 09/14/2011 0508   CALCIUM 8.1* 09/14/2011 0508   GFRNONAA 85* 09/14/2011 0508   GFRAA >90 09/14/2011 0508   CBC    Component Value Date/Time   WBC 6.9 09/14/2011 0508   RBC 2.62* 09/14/2011 0508   HGB 8.0* 09/14/2011 0508   HCT 24.3* 09/14/2011 0508   PLT 181 09/14/2011 0508   MCV 92.7 09/14/2011 0508   MCH 30.5 09/14/2011 0508   MCHC 32.9 09/14/2011 0508   RDW 15.6* 09/14/2011 0508   LYMPHSABS 1.0 03/09/2011 1418   MONOABS 0.5 03/09/2011 1418   EOSABS 0.2 03/09/2011 1418   BASOSABS 0.0 03/09/2011 1418

## 2011-09-14 NOTE — Progress Notes (Signed)
Paged Dr. Roxy Manns advise BP remaining high as well as agitation and confusion.  Received orders to begin Nipride IV and increase labetalol to 20 mg/IV.  Will continue to monitor.

## 2011-09-14 NOTE — Progress Notes (Signed)
Patient Name: [name] Date of Encounter: 09/14/2011    SUBJECTIVE: Had some difficulty with low oxygen saturations on yesterday.  TELEMETRY:  Normal sinus rhythm without pacemaker requirement for the past 24 hours.: Filed Vitals:   09/14/11 0736 09/14/11 0745 09/14/11 0800 09/14/11 0815  BP:   165/60 127/48  Pulse:  96 70 69  Temp: 98.7 F (37.1 C)     TempSrc: Oral     Resp:  35 31 28  Height:      Weight:      SpO2:  94% 95% 97%    Intake/Output Summary (Last 24 hours) at 09/14/11 0849 Last data filed at 09/14/11 0800  Gross per 24 hour  Intake    633 ml  Output   3320 ml  Net  -2687 ml    LABS: Basic Metabolic Panel:  Basename 09/14/11 0508 09/13/11 0555  NA 138 141  K 3.8 4.0  CL 98 104  CO2 30 30  GLUCOSE 133* 116*  BUN 32* 43*  CREATININE 0.85 1.10  CALCIUM 8.1* 7.8*  MG -- --  PHOS -- --   CBC:  Basename 09/14/11 0508 09/13/11 0555  WBC 6.9 6.8  NEUTROABS -- --  HGB 8.0* 7.4*  HCT 24.3* 22.8*  MCV 92.7 91.9  PLT 181 138*    Radiology/Studies:  RADIOLOGY REPORT*  Clinical Data: Postop (heart surgery)  PORTABLE CHEST - 1 VIEW  Comparison: 09/12/2011; 09/11/2011; 09/10/2011  Findings: Grossly unchanged enlarged cardiac silhouette and  mediastinal contours post median sternotomy. Stable position of  support apparatus. No definite pneumothorax. Grossly unchanged  bibasilar heterogeneous opacities. No definite evidence of  pulmonary edema. Small bilateral pleural effusions are suspected.  Grossly unchanged bones.  IMPRESSION:  Unchanged bibasilar opacities favored to represent atelectasis.  Original Report Authenticated By: Darrold Junker   Physical Exam: Blood pressure 127/48, pulse 69, temperature 98.7 F (37.1 C), temperature source Oral, resp. rate 28, height 5' 10.87" (1.8 m), weight 137.2 kg (302 lb 7.5 oz), SpO2 97.00%. Weight change: -4.9 kg (-10 lb 12.8 oz)   Decreased breath sounds bilaterally. No wheezing is  heard.  The cardiac exam reveals no gallop or rub.  Neck exam reveals a large ecchymosis in the right supraclavicular and lateral neck region.  ASSESSMENT:  1. Poor blood pressure control  2. Volume overload/acute diastolic heart failure is improved with diuresis over the weekend.  3. Bradycardia has resolved.  4. No ischemic chest pain or neurological symptoms in this patient with operated/revascularized CAD and carotid disease.   Plan:  1. Because we're having such difficulty with his blood pressure, I will start to resume some of his typical antihypertensive agents including amlodipine and a low-dose beta blocker. I will hold ACE inhibitor therapy until his mind status is stable.  Demetrios Isaacs 09/14/2011, 8:49 AM

## 2011-09-14 NOTE — Progress Notes (Signed)
Pt O2 Saturations on 2L [place] drop into 70s and rebound into 90s while pt resting.  Change [place] to 50% venti mask to accommodate pt mouth breathing and possible sleep apnea.  Will continue to monitor.

## 2011-09-14 NOTE — Progress Notes (Signed)
CARDIAC REHAB PHASE I   PRE:  Rate/Rhythm: 67 SR    BP: sitting 133/73    SaO2: 85 RA, 94 2L  MODE:  Ambulation: 150 ft   POST:  Rate/Rhythm: 75    BP: sitting 160/60     SaO2: 89 2L  Used assist x2 with RW and 2L O2. Pt fairly controlled. C/o steering troubles due to RW. SOB/fatigued after walk. Difficult to stand due to bad legs and size. Will continue to as x2. [id number]  Darrick Meigs CES, ACSM

## 2011-09-14 NOTE — Progress Notes (Signed)
Pt becoming increasingly agitated and confused.  Pt attempting to get out of bed on his own.  Pt continually reminded not to push or pull with his arms but pt continues to ignore reminders.  Pt refused to let lab draw morning labs.  After 2 nurses discussed importance of lab draws with him, pt finally agreed to having them done.  Pt BP continues remains elevated.

## 2011-09-15 LAB — GLUCOSE, CAPILLARY
Glucose-Capillary: 128 mg/dL — ABNORMAL HIGH (ref 70–99)
Glucose-Capillary: 135 mg/dL — ABNORMAL HIGH (ref 70–99)

## 2011-09-15 LAB — BASIC METABOLIC PANEL
Calcium: 8.4 mg/dL (ref 8.4–10.5)
GFR calc non Af Amer: >90 mL/min (ref >90)
Sodium: 139 mEq/L (ref 135–145)

## 2011-09-15 LAB — CBC
MCH: 29.6 pg (ref 26.0–34.0)
MCHC: 32.1 g/dL (ref 30.0–36.0)
Platelets: 222 10*3/uL (ref 150–400)
RBC: 2.74 MIL/uL — ABNORMAL LOW (ref 4.22–5.81)

## 2011-09-15 MED ORDER — METOPROLOL TARTRATE 25 MG PO TABS
25.0000 mg | ORAL_TABLET | Freq: Once | ORAL | Status: AC
  Administered 2011-09-15: 25 mg via ORAL

## 2011-09-15 MED ORDER — ADULT MULTIVITAMIN W/MINERALS CH
1.0000 | ORAL_TABLET | Freq: Every day | ORAL | Status: DC
  Administered 2011-09-15 – 2011-09-20 (×5): 1 via ORAL
  Filled 2011-09-15 (×7): qty 1

## 2011-09-15 MED ORDER — HALOPERIDOL LACTATE 5 MG/ML IJ SOLN
2.0000 mg | Freq: Once | INTRAMUSCULAR | Status: AC
  Administered 2011-09-15: 2 mg via INTRAVENOUS
  Filled 2011-09-15: qty 0.4

## 2011-09-15 MED ORDER — SODIUM CHLORIDE 0.9 % IV SOLN: 250.0000 mL | INTRAVENOUS | Status: DC | PRN

## 2011-09-15 MED ORDER — NITROGLYCERIN IN D5W 200-5 MCG/ML-% IV SOLN
2.0000 ug/min | INTRAVENOUS | Status: DC
  Administered 2011-09-15: 5 ug/min via INTRAVENOUS
  Administered 2011-09-16 – 2011-09-17 (×3): 100 ug/min via INTRAVENOUS
  Filled 2011-09-15 (×6): qty 250

## 2011-09-15 MED ORDER — HYDRALAZINE HCL 20 MG/ML IJ SOLN
10.0000 mg | Freq: Once | INTRAMUSCULAR | Status: AC
  Administered 2011-09-15: 10 mg via INTRAVENOUS
  Filled 2011-09-15: qty 0.5

## 2011-09-15 MED ORDER — HALOPERIDOL LACTATE 5 MG/ML IJ SOLN
2.0000 mg | Freq: Four times a day (QID) | INTRAMUSCULAR | Status: DC | PRN
  Administered 2011-09-16 – 2011-09-17 (×3): 2 mg via INTRAVENOUS
  Filled 2011-09-15 (×3): qty 1

## 2011-09-15 MED ORDER — RAMIPRIL 5 MG PO CAPS
5.0000 mg | ORAL_CAPSULE | Freq: Two times a day (BID) | ORAL | Status: DC
  Administered 2011-09-15 – 2011-09-16 (×3): 5 mg via ORAL
  Filled 2011-09-15 (×6): qty 1

## 2011-09-15 MED ORDER — VITAMIN B-1 100 MG PO TABS
100.0000 mg | ORAL_TABLET | Freq: Every day | ORAL | Status: DC
  Administered 2011-09-15 – 2011-09-29 (×14): 100 mg via ORAL
  Filled 2011-09-15 (×16): qty 1

## 2011-09-15 MED ORDER — LORAZEPAM 1 MG PO TABS
1.0000 mg | ORAL_TABLET | Freq: Every day | ORAL | Status: DC
  Administered 2011-09-15 – 2011-09-17 (×3): 1 mg via ORAL
  Filled 2011-09-15 (×3): qty 1

## 2011-09-15 NOTE — Progress Notes (Signed)
Physical medicine and rehabilitation consult requested. Chart has been reviewed. Patient is status post carotid surgery as well as CABG. Noted very difficult night rapid responses been called x2 in regards to decrease in oxygen saturation as well as severe agitation and restlessness. I spoke to his daughter who requested this time to hold on formal rehabilitation consult until patient's restlessness has improved. Please reconsult when appropriate to establish discharge plan for formal rehabilitation consult.

## 2011-09-15 NOTE — Progress Notes (Addendum)
6 Days Post-Op Procedure(s) (LRB): ENDARTERECTOMY CAROTID (Right) CORONARY ARTERY BYPASS GRAFTING (CABG) (N/A) AORTA -INNOMIATE BYPASS ()  Subjective: Patient very agitated and confused. Daughter states this started yesterday but has progressively gotten worse.He will not let me examen him.  Objective: Vital signs in last 24 hours: Patient Vitals for the past 24 hrs:  BP Temp Temp src Pulse Resp SpO2 Weight  09/15/11 0500 - - - - - - 304 lb (137.893 kg)  09/15/11 0413 190/76 mmHg 98.4 F (36.9 C) Oral 73  24  95 % -  09/15/11 0354 - - Oral - - - -  09/15/11 0345 - - - - - - 304 lb 4.8 oz (138.03 kg)  09/14/11 2054 142/62 mmHg 98.3 F (36.8 C) Oral 73  20  - -  09/14/11 1443 133/73 mmHg 98.5 F (36.9 C) Oral 65  22  98 % -  09/14/11 1400 90/68 mmHg - - 64  28  95 % -  09/14/11 1300 108/55 mmHg - - 64  23  93 % -  09/14/11 1245 80/59 mmHg - - 64  25  92 % -  09/14/11 1230 150/46 mmHg - - 64  21  94 % -  09/14/11 1226 - - - - - 96 % -  09/14/11 1225 152/49 mmHg - - 65  30  91 % -  09/14/11 1215 142/115 mmHg - - 66  24  96 % -  09/14/11 1200 155/64 mmHg - - 74  18  90 % -  09/14/11 1148 - 98.2 F (36.8 C) Oral - - - -  09/14/11 1145 106/68 mmHg - - 70  25  93 % -  09/14/11 1130 107/81 mmHg - - 70  22  92 % -  09/14/11 1115 136/91 mmHg - - 68  24  100 % -  09/14/11 1100 145/55 mmHg - - 70  26  98 % -  09/14/11 1045 170/61 mmHg - - 69  28  98 % -  09/14/11 1030 124/105 mmHg - - 72  - 92 % -  09/14/11 1015 - - - 79  31  100 % -  09/14/11 1000 180/59 mmHg - - 73  32  96 % -  09/14/11 0945 129/48 mmHg - - 74  22  92 % -  09/14/11 0935 107/94 mmHg - - 71  - - -  09/14/11 0930 107/94 mmHg - - 73  28  96 % -  09/14/11 0915 135/116 mmHg - - 70  19  85 % -  09/14/11 0900 98/63 mmHg - - 70  25  95 % -  09/14/11 0845 150/50 mmHg - - 71  30  96 % -  09/14/11 0830 129/41 mmHg - - 70  23  98 % -  09/14/11 0815 127/48 mmHg - - 69  28  97 % -  09/14/11 0800 165/60 mmHg - - 70  31  95 % -    09/14/11 0745 - - - 96  35  94 % -  09/14/11 0736 - 98.7 F (37.1 C) Oral - - - -  09/14/11 0730 171/56 mmHg - - 100  36  63 % -   Pre op weight  135.6 kg Current Weight  09/15/11 304 lb (137.893 kg)      Intake/Output from previous day: 04/22 0701 - 04/23 0700 In: 155.2 [I.V.:62.2; IV Piggyback:8] Out: [id number] [Urine:1150; Drains:70]   Physical Exam:  Cardiovascular: RRR on tele Extremities:  Bilateral lower extremity edema. Wounds: Clean and dry.  No erythema or signs of infection.  Lab Results: CBC: Basename 09/14/11 0508 09/13/11 0555  WBC 6.9 6.8  HGB 8.0* 7.4*  HCT 24.3* 22.8*  PLT 181 138*   BMET:  Basename 09/14/11 0508 09/13/11 0555  NA 138 141  K 3.8 4.0  CL 98 104  CO2 30 30  GLUCOSE 133* 116*  BUN 32* 43*  CREATININE 0.85 1.10  CALCIUM 8.1* 7.8*    PT/INR: No results found for this basename: LABPROT,INR in the last 72 hours ABG:  INR: Will add last result for INR, ABG once components are confirmed Will add last 4 CBG results once components are confirmed  Assessment/Plan:  1. CV - Hypertensive this am.On Norvasc 5 daily, Lopressor 25 bid.Will give Hydralazine IV now. Then will place on Nitro gttp for hypertension. 2.  Pulmonary - Encourage incentive spirometer. 3. Volume Overload - Continue with diuresis. 4.  Acute blood loss anemia - Last H/H  8/24.3. 5.Confusion and agitation-given Haldol 2 mg at around 6 am. 6.Per discussion with Dr. Cyndia Bent, will transfer back to 2300.  7.Apparently, no history of etoh abuse, but will give thiamine and multivitamin.     ZIMMERMAN,DONIELLE MPA-C 09/15/2011    Chart reviewed, patient examined, agree with above. He is back in SICU now and alert, more cooperative and oriented to person, place, and time.  Still says some unusual things.  Hypertensive so will restart his ramipril. I think he just has postop delerium.

## 2011-09-15 NOTE — Progress Notes (Signed)
SUBJECTIVE:  Doing well with no complaints  OBJECTIVE:   Vitals:   Filed Vitals:   09/15/11 0915 09/15/11 0930 09/15/11 1000 09/15/11 1128  BP: 193/63 185/65 165/58   Pulse: 71 70 66   Temp:    97.3 F (36.3 C)  TempSrc:    Oral  Resp: 28 27 25    Height:      Weight:      SpO2: 96% 98% 98%    I&O's:   Intake/Output Summary (Last 24 hours) at 09/15/11 1241 Last data filed at 09/15/11 1000  Gross per 24 hour  Intake  348.5 ml  Output    175 ml  Net  173.5 ml   TELEMETRY: Reviewed telemetry pt in nsr:     PHYSICAL EXAM General: Well developed, well nourished, in no acute distress Head: Eyes PERRLA, No xanthomas.   Normal cephalic and atramatic  Lungs:   Clear bilaterally to auscultation and percussion. Heart:   HRRR S1 S2 Pulses are 2+ & equal. Abdomen: Bowel sounds are positive, abdomen soft and non-tender without masses  Extremities:   No clubbing, cyanosis or edema.  DP +1 Neuro: Alert and oriented X 3. Psych:  Good affect, responds appropriately   LABS: Basic Metabolic Panel:  Basename 09/15/11 0903 09/14/11 0508  NA 139 138  K 4.3 3.8  CL 100 98  CO2 29 30  GLUCOSE 141* 133*  BUN 24* 32*  CREATININE 0.68 0.85  CALCIUM 8.4 8.1*  MG -- --  PHOS -- --   Liver Function Tests: No results found for this basename: AST:2,ALT:2,ALKPHOS:2,BILITOT:2,PROT:2,ALBUMIN:2 in the last 72 hours No results found for this basename: LIPASE:2,AMYLASE:2 in the last 72 hours CBC:  Basename 09/15/11 0903 09/14/11 0508  WBC 9.2 6.9  NEUTROABS -- --  HGB 8.1* 8.0*  HCT 25.2* 24.3*  MCV 92.0 92.7  PLT 222 181   Cardiac Enzymes: No results found for this basename: CKTOTAL:3,CKMB:3,CKMBINDEX:3,TROPONINI:3 in the last 72 hours BNP: No components found with this basename: POCBNP:3 D-Dimer: No results found for this basename: DDIMER:2 in the last 72 hours Hemoglobin A1C: No results found for this basename: HGBA1C in the last 72 hours Fasting Lipid Panel: No results found  for this basename: CHOL,HDL,LDLCALC,TRIG,CHOLHDL,LDLDIRECT in the last 72 hours Thyroid Function Tests: No results found for this basename: TSH,T4TOTAL,FREET3,T3FREE,THYROIDAB in the last 72 hours Anemia Panel: No results found for this basename: VITAMINB12,FOLATE,FERRITIN,TIBC,IRON,RETICCTPCT in the last 72 hours Coag Panel:   Lab Results  Component Value Date   INR 1.48 09/09/2011   INR 1.63* 09/09/2011   INR 1.00 09/07/2011    RADIOLOGY: Dg Chest 2 View  09/07/2011  *RADIOLOGY REPORT*  Clinical Data: Preoperative respiratory films.  CHEST - 2 VIEW  Comparison: Chest 06/17/2011 and 03/09/2011.  Findings: Lungs are clear.  No pneumothorax or pleural effusion. Heart size is upper normal.  IMPRESSION: No acute disease.  Original Report Authenticated By: Arvid Right. Luther Parody, M.D.   Dg Chest Port 1 View  09/13/2011  *RADIOLOGY REPORT*  Clinical Data: Postop (heart surgery)  PORTABLE CHEST - 1 VIEW  Comparison: 09/12/2011; 09/11/2011; 09/10/2011  Findings: Grossly unchanged enlarged cardiac silhouette and mediastinal contours post median sternotomy.  Stable position of support apparatus.  No definite pneumothorax.  Grossly unchanged bibasilar heterogeneous opacities.  No definite evidence of pulmonary edema.  Small bilateral pleural effusions are suspected. Grossly unchanged bones.  IMPRESSION: Unchanged bibasilar opacities favored to represent atelectasis.  Original Report Authenticated By: Rachel Moulds, M.D.   Dg Chest Portable  1 View In Am  09/12/2011  *RADIOLOGY REPORT*  Clinical Data: Postop cardiac surgery  PORTABLE CHEST - 1 VIEW  Comparison: 09/11/2011; 09/10/2011; 09/09/2011  Findings:  Grossly unchanged enlarged cardiac silhouette and mediastinal contours post median sternotomy.  Interval extubation and removal of enteric tube.  Interval removal of left jugular approach central venous catheter with vascular sheath tip remaining over the left innominate vein.  Interval removal of left-sided  chest tube and mediastinal drains.  No definite pneumothorax.  Overall improved inspiration with minimal bibasilar heterogeneous opacities. Suspect small left-sided pleural effusion.  Unchanged bones.  IMPRESSION: 1.  Interval removal of support apparatus.  No definite pneumothorax. 2.  Improved inspiratory effort with persistent bibasilar opacities favored to represent atelectasis.  Original Report Authenticated By: Rachel Moulds, M.D.   Dg Chest Port 1 View  09/11/2011  *RADIOLOGY REPORT*  Clinical Data: Recent open heart surgery.  PORTABLE CHEST - 1 VIEW  Comparison: 09/10/2011  Findings: Endotracheal tube, NG tube, Swan-Ganz catheter, and chest tubes remain. No pneumothorax.  Small bilateral pleural effusions. Pulmonary vascularity is normal.  IMPRESSION: No significant change.  No pneumothorax.  Original Report Authenticated By: Larey Seat, M.D.   Dg Chest Portable 1 View In Am  09/10/2011  *RADIOLOGY REPORT*  Clinical Data: Postop  PORTABLE CHEST - 1 VIEW  Comparison: 09/09/2011  Findings: Cardiomediastinal silhouette is stable.  Again noted status post CABG.  Endotracheal tube in place with tip 3.6 cm above the carina.  Stable left IJ Swan-Ganz catheter position.  Stable left chest tube position and mediastinal drain.  Central mild vascular congestion without convincing edema.  Bilateral basilar atelectasis or infiltrate right greater than left.  Question small right pleural effusion.  IMPRESSION: Again noted status post CABG.  Endotracheal tube in place with tip 3.6 cm above the carina.  Stable left IJ Swan-Ganz catheter position.  Stable left chest tube position and mediastinal drain. Central mild vascular congestion without convincing edema. Bilateral basilar atelectasis or infiltrate right greater than left.  Question small right pleural effusion.  Original Report Authenticated By: Lahoma Crocker, M.D.   Dg Chest Portable 1 View  09/09/2011  *RADIOLOGY REPORT*  Clinical Data: Status post CABG.   PORTABLE CHEST - 1 VIEW  Comparison: PA and lateral chest 09/07/2011.  Findings: Endotracheal tube is in place with tip in good position just below the clavicular heads.  NG tube courses into the stomach and below the inferior margin of the film.  A left IJ approach Swan- Ganz catheter is in place with the tip of the Swan just within the right main pulmonary artery.  Left chest tube is identified.  The patient has some basilar atelectasis, greater on the left.  No pneumothorax.  No pulmonary edema.  Cardiomegaly.  IMPRESSION:  1.  Support apparatus in good position.  No pneumothorax. 2.  Basilar atelectasis, greater on the left.  Original Report Authenticated By: Arvid Right. D'ALESSIO, M.D.      ASSESSMENT:  1. Severe multivessel ASCAD s/p CABG  2. Carotid artery stenosis s/p Right CEA  3. Post op afib with no reoccurrence - NSR underlying a paced rhythm at 60bpm  4. Post op anemia  5.  Confusion and combativeness c/w postop delerium 6.  HTN with increased BP last PM now improved on NTG gtt 7.  Volume overload on IV diuretics    PLAN:   Adjust meds as needed for BP control - consider increasing dose of Ramipril - he was on 10mg  BID  at home and renal function is stable  Sueanne Margarita, MD  09/15/2011  12:41 PM

## 2011-09-15 NOTE — Progress Notes (Signed)
Asked by RT to see patient after RT had been called by bedside RN regarding increased O2 needs. Upon assessment, patient alert and resting comfortably in the bed on 3L [place]. Lungs are clear bilaterally, productive cough, no shortness of breath, encouraged patient and patient's daughter to perform IS more frequently. Patient stated he had not used it since dinner (about 5p). I stressed the importance of using the IS ten times every hour while awake, patient and daughter verbalized understanding. Advised bedside RN To call with any further needs, will continue to monitor

## 2011-09-15 NOTE — Progress Notes (Addendum)
VASCULAR AND VEIN SURGERY POST - OP CEA PROGRESS NOTE  Date of Surgery: 09/09/2011 Surgeon: Surgeon(s): Gaye Pollack, MD Elam Dutch, MD 6 Days Post-Op right Carotid Endarterectomy .  HPI: [name] is a 71 y.o. male who is 6 Days Post-Op left Carotid Endarterectomy and AORTA -INNOMIATE BYPASS   . Patient is doing well. Pre-operative symptoms are Improved Patient denies headache; Patient denies difficulty swallowing; denies weakness in upper or lower extremities; Pt. denies other symptoms of stroke or TIA. Patient is somewhat confused and angry. IMAGING: No results found.  Significant Diagnostic Studies: CBC Lab Results  Component Value Date   WBC 6.9 09/14/2011   HGB 8.0* 09/14/2011   HCT 24.3* 09/14/2011   MCV 92.7 09/14/2011   PLT 181 09/14/2011    BMET    Component Value Date/Time   NA 138 09/14/2011 0508   K 3.8 09/14/2011 0508   CL 98 09/14/2011 0508   CO2 30 09/14/2011 0508   GLUCOSE 133* 09/14/2011 0508   BUN 32* 09/14/2011 0508   CREATININE 0.85 09/14/2011 0508   CALCIUM 8.1* 09/14/2011 0508   GFRNONAA 85* 09/14/2011 0508   GFRAA >90 09/14/2011 0508    COAG Lab Results  Component Value Date   INR 1.48 09/09/2011   INR 1.63* 09/09/2011   INR 1.00 09/07/2011   No results found for this basename: PTT      Intake/Output Summary (Last 24 hours) at 09/15/11 0745 Last data filed at 09/15/11 0116  Gross per 24 hour  Intake 155.15 ml  Output   1220 ml  Net -1064.85 ml    Physical Exam:  BP Readings from Last 3 Encounters:  09/15/11 190/76  09/15/11 190/76  09/07/11 162/59   Temp Readings from Last 3 Encounters:  09/15/11 98.4 F (36.9 C) Oral  09/15/11 98.4 F (36.9 C) Oral  09/07/11 97.7 F (36.5 C) Oral   SpO2 Readings from Last 3 Encounters:  09/15/11 95%  09/15/11 95%  09/07/11 96%   Pulse Readings from Last 3 Encounters:  09/15/11 73  09/15/11 73  09/07/11 57    Pt is A&O x 3 Speech is rapid left Neck Wound is clean, dry,  intact or healing well Patient with Negative tongue deviation and Negative facial droop Pt has good and equal strength in all extremities  Assessment: [name] is a 71 y.o. male is S/P Left Carotid endarterectomy Pt is voiding, ambulating and taking po well   Plan: Hypertensive without good control and he will be moved to the intensive care unit for IV hypertensive treatment and close observation  Roxy Horseman [id number] 09/15/2011 7:45 AM   Some confusion today.  "Concerned about kidnapping and daughter plotting with other patients"  According to wife had some similar problems after his prior CEA.  Also hypertensive this a.m. Requiring transfer to ICU On my exam he has subtle confusion apparently was combative last pm.  He has improved some this morning according to wife.  Spoke with her and updated her on pt condition  Neck ecchymosis without hematoma unchanged 2+ right radial pulse Neuro-UE LE 5/5 follows commands  Post op delirium Cont to follow Intermittent Haldol for agitation  Ruta Hinds, MD [facility] Office: [phone] Pager: [phone]

## 2011-09-15 NOTE — Progress Notes (Signed)
Pt noted with increased confusion and agitation, on call physician Festus Barren, MD notified, order recieved to transfer pt to PCTU, will continue plan of care.

## 2011-09-15 NOTE — Progress Notes (Signed)
Nutrition Follow-up  Pt extubated 4/19. States his appetite is OK; not as good as it was prior to surgery. PO intake variable at 25-50% per flowsheet records. Noted had experienced some post-op delirium. Declining addition of supplements at this time.  Diet Order:  Carbohydrate Modified Medium Calorie  Meds: Scheduled Meds:   . amLODipine  5 mg Oral Daily  . aspirin EC  325 mg Oral Daily  . docusate sodium  200 mg Oral Daily  . enoxaparin  40 mg Subcutaneous Daily  . fenofibrate  160 mg Oral Daily  . furosemide  80 mg Intravenous BID  . gabapentin  300 mg Oral BID  . haloperidol lactate  2 mg Intravenous Once  . hydrALAZINE  10 mg Intravenous Once  . insulin aspart  0-24 Units Subcutaneous TID AC & HS  . insulin glargine  20 Units Subcutaneous Daily  . metoprolol tartrate  25 mg Oral BID  . metoprolol tartrate  25 mg Oral Once  . moving right along book   Does not apply Once  . mulitivitamin with minerals  1 tablet Oral Daily  . pantoprazole  40 mg Oral QAC breakfast  . potassium chloride  20 mEq Oral BID  . ramipril  5 mg Oral BID  . rosuvastatin  10 mg Oral q1800  . sodium chloride  3 mL Intravenous Q12H  . thiamine  100 mg Oral Daily  . DISCONTD: acetaminophen (TYLENOL) oral liquid 160 mg/5 mL  975 mg Per Tube Q6H  . DISCONTD: acetaminophen  1,000 mg Oral Q6H  . DISCONTD: aspirin  324 mg Per Tube Daily  . DISCONTD: aspirin EC  325 mg Oral Daily  . DISCONTD: bisacodyl  10 mg Oral Daily  . DISCONTD: bisacodyl  10 mg Rectal Daily  . DISCONTD: digoxin  0.25 mg Intravenous Daily  . DISCONTD: docusate sodium  200 mg Oral Daily  . DISCONTD: insulin aspart  0-24 Units Subcutaneous Q4H  . DISCONTD: pantoprazole  40 mg Oral Q1200  . DISCONTD: sodium chloride  3 mL Intravenous Q12H   Continuous Infusions:   . nitroGLYCERIN 5 mcg/min (09/15/11 0829)  . DISCONTD: sodium chloride Stopped (09/11/11 0915)  . DISCONTD: sodium chloride    . DISCONTD: sodium chloride    . DISCONTD:  DOPamine Stopped (09/12/11 1100)  . DISCONTD: nitroPRUSSide Stopped (09/14/11 0950)   PRN Meds:.sodium chloride, bisacodyl, bisacodyl, ondansetron (ZOFRAN) IV, ondansetron, sodium chloride, traMADol, DISCONTD: sodium chloride, DISCONTD: fentaNYL, DISCONTD: labetalol, DISCONTD: ondansetron (ZOFRAN) IV, DISCONTD: oxyCODONE, DISCONTD: oxyCODONE, DISCONTD: promethazine, DISCONTD: sodium chloride  Labs:  CMP     Component Value Date/Time   NA 139 09/15/2011 0903   K 4.3 09/15/2011 0903   CL 100 09/15/2011 0903   CO2 29 09/15/2011 0903   GLUCOSE 141* 09/15/2011 0903   BUN 24* 09/15/2011 0903   CREATININE 0.68 09/15/2011 0903   CALCIUM 8.4 09/15/2011 0903   PROT 6.5 09/07/2011 1647   ALBUMIN 3.2* 09/07/2011 1647   AST 14 09/07/2011 1647   ALT 16 09/07/2011 1647   ALKPHOS 56 09/07/2011 1647   BILITOT 0.2* 09/07/2011 1647   GFRNONAA >90 09/15/2011 0903   GFRAA >90 09/15/2011 0903     Intake/Output Summary (Last 24 hours) at 09/15/11 1458 Last data filed at 09/15/11 1300  Gross per 24 hour  Intake    353 ml  Output   1900 ml  Net  -1547 ml    CBG (last 3)   Basename 09/15/11 1124 09/15/11 0857 09/14/11 2057  GLUCAP  135* 128* 147*    Weight Status:  137.8 kg (4/23) -- down likely due to diuresis  Re-estimated needs:  2300-2500 kcals, 115-125 gm protein  Nutrition Dx:  Inadequate Oral Intake related to decreased appetite as evidenced by PO intake 25-50%.  Status: Ongoing.  New Goal:  Oral intake to meet >90% of estimated nutrition needs, unmet Monitor: PO intake, desire for supplements, weight, labs, I/O's  Intervention:    Recommend Carbohydrate Modified High Calorie diet  No nutrition intervention at this time -- pt declined  RD to follow for nutrition care plan  Lady Deutscher Pager #:  [phone]

## 2011-09-15 NOTE — Progress Notes (Signed)
Called by bedside RN to speak with patient's daughter regarding her concern about her father's increased confusion and agitation. Advised bedside RN to call MD as this was not the first time daughter had expressed concern about her father's condition. Upon arrival patient alert, oriented, but had been having several instances of urinary incontinence. Spoke with daughter at length about ICU psychosis in which she mentioned her father had issues with in previous hospital admissions. Patient moved to rooms closer to nurses station, daughter at bedside, bed alarm on, advised bedside RN to call with further issues, will continue to monitor

## 2011-09-16 ENCOUNTER — Inpatient Hospital Stay (HOSPITAL_COMMUNITY): Payer: Medicare Other

## 2011-09-16 LAB — GLUCOSE, CAPILLARY
Glucose-Capillary: 193 mg/dL — ABNORMAL HIGH (ref 70–99)
Glucose-Capillary: 160 mg/dL — ABNORMAL HIGH (ref 70–99)
Glucose-Capillary: 118 mg/dL — ABNORMAL HIGH (ref 70–99)

## 2011-09-16 MED ORDER — HALOPERIDOL LACTATE 5 MG/ML IJ SOLN
4.0000 mg | Freq: Once | INTRAMUSCULAR | Status: AC
  Administered 2011-09-16: 4 mg via INTRAVENOUS

## 2011-09-16 MED ORDER — LABETALOL HCL 5 MG/ML IV SOLN
20.0000 mg | Freq: Once | INTRAVENOUS | Status: AC
  Administered 2011-09-16: 20 mg via INTRAVENOUS
  Filled 2011-09-16: qty 4

## 2011-09-16 MED ORDER — LABETALOL HCL 5 MG/ML IV SOLN
20.0000 mg | INTRAVENOUS | Status: DC | PRN
  Administered 2011-09-16 – 2011-09-17 (×2): 20 mg via INTRAVENOUS

## 2011-09-16 NOTE — Progress Notes (Signed)
Pt progressively becoming confused, agitated, and hypertensive. NTG gtt titrated to 100 mcg/min. Pt given ativan and haldol per MD orders.B/P remains elevated. Pt attempting to pull all tubes & lines. Pt attempting to bet out of bed. All attempts to reorient and redirect unsuccessful. Pt with periods of desaturation. MD notified, pt restrained. Oxygen titrated to 50% venturi mask. Orders for antihypertensives and antipsychotics received

## 2011-09-16 NOTE — Progress Notes (Addendum)
VASCULAR AND VEIN SURGERY POST - OP CEA PROGRESS NOTE  Date of Surgery: 09/09/2011 Surgeon: Surgeon(s): Gaye Pollack, MD Elam Dutch, MD 7 Days Post-Op left Carotid Endarterectomy and AORTA -INNOMIATE BYPASS    HPI: [name] is a 71 y.o. male who is 7 Days Post-Op right Carotid Endarterectomy . Patient is doing well. Pre-operative symptoms are Improved Patient is sedated due to agitation.  IMAGING: No results found.  Significant Diagnostic Studies: CBC Lab Results  Component Value Date   WBC 9.2 09/15/2011   HGB 8.1* 09/15/2011   HCT 25.2* 09/15/2011   MCV 92.0 09/15/2011   PLT 222 09/15/2011    BMET    Component Value Date/Time   NA 139 09/15/2011 0903   K 4.3 09/15/2011 0903   CL 100 09/15/2011 0903   CO2 29 09/15/2011 0903   GLUCOSE 141* 09/15/2011 0903   BUN 24* 09/15/2011 0903   CREATININE 0.68 09/15/2011 0903   CALCIUM 8.4 09/15/2011 0903   GFRNONAA >90 09/15/2011 0903   GFRAA >90 09/15/2011 0903    COAG Lab Results  Component Value Date   INR 1.48 09/09/2011   INR 1.63* 09/09/2011   INR 1.00 09/07/2011   No results found for this basename: PTT      Intake/Output Summary (Last 24 hours) at 09/16/11 0854 Last data filed at 09/16/11 0600  Gross per 24 hour  Intake 1133.75 ml  Output   4025 ml  Net -2891.25 ml    Physical Exam:  BP Readings from Last 3 Encounters:  09/16/11 102/57  09/16/11 102/57  09/07/11 162/59   Temp Readings from Last 3 Encounters:  09/16/11 98.7 F (37.1 C) Axillary  09/16/11 98.7 F (37.1 C) Axillary  09/07/11 97.7 F (36.5 C) Oral   SpO2 Readings from Last 3 Encounters:  09/16/11 100%  09/16/11 100%  09/07/11 96%   Pulse Readings from Last 3 Encounters:  09/16/11 78  09/16/11 78  09/07/11 57      right Neck Wound is clean, dry, intact or healing well   Assessment: [name] is a 71 y.o. male is S/P Right Carotid endarterectomy AORTA -INNOMIATE BYPASS   Patient is sedated due to agitation      Plan: Continue acute care directed by Dr. Jeanelle Malling, EMMA [facility] [id number] 09/16/2011 8:54 AM   Pt still confused Some slurred speech No focal neuro findings Neck incision healing  Probably post op delirium but will check head CT to rule out infarct or bleed  Ruta Hinds, MD [facility] [place]: [phone] Pager: [phone]

## 2011-09-16 NOTE — Progress Notes (Signed)
Patient ID: BANKS QU, male   DOB: 1940/12/01, 71 y.o.   MRN: [id number]  Filed Vitals:   09/16/11 2000 09/16/11 2100 09/16/11 2200 09/16/11 2230  BP: 175/85 131/75 156/47 180/57  Pulse: 78 71 73 77  Temp:      TempSrc:      Resp: 29 28 25 26   Height:      Weight:      SpO2: 94% 99% 100% 95%   On 136mcg NTG drip  Has remained delerious today but slept most of the day.  Diuresing well.

## 2011-09-16 NOTE — Progress Notes (Signed)
PT Cancellation Note  Treatment cancelled today due to medical issues with patient which prohibited therapy.  INGOLD,Tarence Searcy 09/16/2011, 11:45 AM  [facility] [phone] (pager)

## 2011-09-16 NOTE — Progress Notes (Signed)
7 Days Post-Op Procedure(s) (LRB): ENDARTERECTOMY CAROTID (Right) CORONARY ARTERY BYPASS GRAFTING (CABG) (N/A) AORTA -INNOMIATE BYPASS () Subjective: Sedated but responds  Objective: Vital signs in last 24 hours: Temp:  [97.3 F (36.3 C)-98.8 F (37.1 C)] 98.7 F (37.1 C) (04/24 0804) Pulse Rate:  [65-85] 78  (04/24 0700) Cardiac Rhythm:  [-] Normal sinus rhythm (04/23 2000) Resp:  [15-35] 28  (04/24 0700) BP: (102-194)/(39-131) 102/57 mmHg (04/24 0700) SpO2:  [92 %-100 %] 100 % (04/24 0700) FiO2 (%):  [50 %] 50 % (04/24 0300) Weight:  [137.2 kg (302 lb 7.5 oz)] 137.2 kg (302 lb 7.5 oz) (04/24 0600)  Hemodynamic parameters for last 24 hours:    Intake/Output from previous day: 04/23 0701 - 04/24 0700 In: 1135.3 [P.O.:480; I.V.:639.3; IV Piggyback:16] Out: 4025 [Urine:4025] Intake/Output this shift:    General appearance: delirious Heart: regular rate and rhythm, S1, S2 normal, no murmur, click, rub or gallop Lungs: clear to auscultation bilaterally Extremities: edema mild Wound: serosanguinous drainage from leg incisions  Lab Results:  [facility] 09/15/11 0903 09/14/11 0508  WBC 9.2 6.9  HGB 8.1* 8.0*  HCT 25.2* 24.3*  PLT 222 181   BMET:  Basename 09/15/11 0903 09/14/11 0508  NA 139 138  K 4.3 3.8  CL 100 98  CO2 29 30  GLUCOSE 141* 133*  BUN 24* 32*  CREATININE 0.68 0.85  CALCIUM 8.4 8.1*    PT/INR: No results found for this basename: LABPROT,INR in the last 72 hours ABG    Component Value Date/Time   PHART 7.414 09/11/2011 1231   HCO3 23.9 09/11/2011 1231   TCO2 25 09/11/2011 1231   ACIDBASEDEF 1.0 09/11/2011 1231   O2SAT 91.0 09/11/2011 1231   CBG (last 3)   Basename 09/16/11 0746 09/15/11 2133 09/15/11 1704  GLUCAP 193* 138* 170*    Assessment/Plan: S/P Procedure(s) (LRB): ENDARTERECTOMY CAROTID (Right) CORONARY ARTERY BYPASS GRAFTING (CABG) (N/A) AORTA -INNOMIATE BYPASS () Postoperative delerium.  Haldol prn Continue diuresis HTN:   Continue oral agents at current level and NTG drip as needed Acute blood loss anemia: stable, repeat Hgb in am.  LOS: 7 days    Ryott Rafferty K 09/16/2011

## 2011-09-17 ENCOUNTER — Inpatient Hospital Stay (HOSPITAL_COMMUNITY): Payer: Medicare Other

## 2011-09-17 LAB — CBC
MCH: 29.3 pg (ref 26.0–34.0)
MCV: 93.1 fL (ref 78.0–100.0)
Platelets: 321 10*3/uL (ref 150–400)
RBC: 2.59 MIL/uL — ABNORMAL LOW (ref 4.22–5.81)

## 2011-09-17 LAB — BASIC METABOLIC PANEL
CO2: 34 mEq/L — ABNORMAL HIGH (ref 19–32)
Calcium: 8.3 mg/dL — ABNORMAL LOW (ref 8.4–10.5)
Creatinine, Ser: 0.78 mg/dL (ref 0.50–1.35)
Glucose, Bld: 133 mg/dL — ABNORMAL HIGH (ref 70–99)

## 2011-09-17 LAB — GLUCOSE, CAPILLARY: Glucose-Capillary: 153 mg/dL — ABNORMAL HIGH (ref 70–99)

## 2011-09-17 LAB — PREPARE RBC (CROSSMATCH)

## 2011-09-17 MED ORDER — FUROSEMIDE 10 MG/ML IJ SOLN
80.0000 mg | Freq: Once | INTRAMUSCULAR | Status: AC
  Administered 2011-09-17: 80 mg via INTRAVENOUS
  Filled 2011-09-17: qty 8

## 2011-09-17 MED ORDER — RAMIPRIL 10 MG PO CAPS
10.0000 mg | ORAL_CAPSULE | Freq: Two times a day (BID) | ORAL | Status: DC
  Administered 2011-09-17 – 2011-09-29 (×25): 10 mg via ORAL
  Filled 2011-09-17 (×27): qty 1

## 2011-09-17 MED ORDER — AMLODIPINE BESYLATE 10 MG PO TABS
10.0000 mg | ORAL_TABLET | Freq: Every day | ORAL | Status: DC
  Administered 2011-09-18 – 2011-09-29 (×12): 10 mg via ORAL
  Filled 2011-09-17 (×12): qty 1

## 2011-09-17 MED ORDER — HYDRALAZINE HCL 20 MG/ML IJ SOLN
10.0000 mg | INTRAMUSCULAR | Status: DC | PRN
  Administered 2011-09-17 – 2011-09-18 (×2): 10 mg via INTRAVENOUS
  Filled 2011-09-17 (×3): qty 0.5

## 2011-09-17 NOTE — Progress Notes (Signed)
Patient ID: [name], male   DOB: Jul 13, 1940, 71 y.o.   MRN: [id number]                   [address]            [place] [address]          [phone]     8 Days Post-Op Procedure(s) (LRB): ENDARTERECTOMY CAROTID (Right) CORONARY ARTERY BYPASS GRAFTING (CABG) (N/A) AORTA -INNOMIATE BYPASS ()  Total Length of Stay:  LOS: 8 days  BP 195/77  Pulse 65  Temp(Src) 98.3 F (36.8 C) (Oral)  Resp 29  Ht 5' 10.87" (1.8 m)  Wt 288 lb 2.3 oz (130.7 kg)  BMI 40.34 kg/m2  SpO2 93%     . DISCONTD: nitroGLYCERIN Stopped (09/17/11 1100)     Lab Results  Component Value Date   WBC 6.5 09/17/2011   HGB 7.6* 09/17/2011   HCT 24.1* 09/17/2011   PLT 321 09/17/2011   GLUCOSE 133* 09/17/2011   ALT 16 09/07/2011   AST 14 09/07/2011   NA 139 09/17/2011   K 4.3 09/17/2011   CL 97 09/17/2011   CREATININE 0.78 09/17/2011   BUN 24* 09/17/2011   CO2 34* 09/17/2011   INR 1.48 09/09/2011   HGBA1C 7.8* 09/07/2011    Still confused  [name] MD  Beeper [phone] Office [phone] 09/17/2011 6:59 PM

## 2011-09-17 NOTE — Progress Notes (Signed)
UR Completed.  [name] [id number] 09/17/2011

## 2011-09-17 NOTE — Progress Notes (Signed)
BP not accurate on cuff, patient continues to tighten his arm when the cuff inflates, unable to follow commands ("relax your arm").

## 2011-09-17 NOTE — Progress Notes (Signed)
Pt agitated, hydralazine administered per parameters for bp.

## 2011-09-17 NOTE — Progress Notes (Signed)
Pt pulling at lines and pinching at sitter and RN.  Safety mittens applied to prevent dislodgement of IV.

## 2011-09-17 NOTE — Progress Notes (Signed)
He appears lethargic and speech somewhat slurred. He has no CV instability. Neuro status is the current issue. He also appears to have sleep apnea. Spoke to family.

## 2011-09-17 NOTE — Progress Notes (Signed)
Unable to ambulate due to lethargy.

## 2011-09-17 NOTE — Progress Notes (Signed)
Patient ID: [name], male   DOB: Apr 10, 1941, 71 y.o.   MRN: [id number]                   [address]            [place] [address]          [phone]     8 Days Post-Op Procedure(s) (LRB): ENDARTERECTOMY CAROTID (Right) CORONARY ARTERY BYPASS GRAFTING (CABG) (N/A) AORTA -INNOMIATE BYPASS ()  Total Length of Stay:  LOS: 8 days  BP 195/77  Pulse 65  Temp(Src) 98.3 F (36.8 C) (Oral)  Resp 29  Ht 5' 10.87" (1.8 m)  Wt 288 lb 2.3 oz (130.7 kg)  BMI 40.34 kg/m2  SpO2 93%     . DISCONTD: nitroGLYCERIN Stopped (09/17/11 1100)     Lab Results  Component Value Date   WBC 6.5 09/17/2011   HGB 7.6* 09/17/2011   HCT 24.1* 09/17/2011   PLT 321 09/17/2011   GLUCOSE 133* 09/17/2011   ALT 16 09/07/2011   AST 14 09/07/2011   NA 139 09/17/2011   K 4.3 09/17/2011   CL 97 09/17/2011   CREATININE 0.78 09/17/2011   BUN 24* 09/17/2011   CO2 34* 09/17/2011   INR 1.48 09/09/2011   HGBA1C 7.8* 09/07/2011   Still asleep not weaning yet Not bleeding  CI 1.9  [name] MD  Beeper [phone] Office [phone] 09/17/2011 6:56 PM

## 2011-09-17 NOTE — Progress Notes (Signed)
Still confused, requiring some Haldol overnight Blood pressure 110/79, pulse 68, temperature 98.1 F (36.7 C), temperature source Oral, resp. rate 26, height 5' 10.87" (1.8 m), weight 288 lb 2.3 oz (130.7 kg), SpO2 99.00%.  Right neck incision healing 2+ right radial pulse Alert and oriented to person only  Head CT no obvious infarct or bleed  CBC    Component Value Date/Time   WBC PENDING 09/17/2011 0445   RBC 2.59* 09/17/2011 0445   HGB 7.6* 09/17/2011 0445   HCT 24.1* 09/17/2011 0445   PLT PENDING 09/17/2011 0445   MCV 93.1 09/17/2011 0445   MCH 29.3 09/17/2011 0445   MCHC 31.5 09/17/2011 0445   RDW 15.8* 09/17/2011 0445   LYMPHSABS 1.0 03/09/2011 1418   MONOABS 0.5 03/09/2011 1418   EOSABS 0.2 03/09/2011 1418   BASOSABS 0.0 03/09/2011 1418    BMET    Component Value Date/Time   NA 139 09/17/2011 0445   K 4.3 09/17/2011 0445   CL 97 09/17/2011 0445   CO2 34* 09/17/2011 0445   GLUCOSE 133* 09/17/2011 0445   BUN 24* 09/17/2011 0445   CREATININE 0.78 09/17/2011 0445   CALCIUM 8.3* 09/17/2011 0445   GFRNONAA 88* 09/17/2011 0445   GFRAA >90 09/17/2011 0445    Post op delirium Continue to monitor for sign/symptoms of infection Hopefully confusion will clear over the next few days  Ruta Hinds, MD [facility] [place] Office: [phone] Pager: [phone]

## 2011-09-17 NOTE — Progress Notes (Signed)
VASCULAR AND VEIN SURGERY POST - OP CEA PROGRESS NOTE  Date of Surgery: 09/09/2011 Surgeon: Surgeon(s): Gaye Pollack, MD Elam Dutch, MD 8 Days Post-Op right Carotid Endarterectomy with  .INNOMIATE BYPASS      HPI: [name] is a 71 y.o. male who is 8 Days Post-Op right Carotid Endarterectomy . Patient is doing well. Pre-operative symptoms are Improved Patient continues to be agitated post op. He is on the unit secondary to hypertension and agitation currently on nitro infusion. IMAGING: Ct Head Wo Contrast  09/16/2011  *RADIOLOGY REPORT*  Clinical Data: 71 year old male with confusion.  CT HEAD WITHOUT CONTRAST  Technique:  Contiguous axial images were obtained from the base of the skull through the vertex without contrast.  Comparison: 03/09/2011 and earlier.  Findings: No acute orbit or scalp soft tissue findings.  Chronic paranasal sinus opacification is stable.  Mastoids are clear. No acute osseous abnormality identified.  Calcified atherosclerosis at the skull base.  Stable cerebral volume.  No ventriculomegaly. No midline shift, mass effect, or evidence of mass lesion.  No acute intracranial hemorrhage identified.  Confluent periventricular white matter hypodensity is stable. No evidence of cortically based acute infarction identified.  No suspicious intracranial vascular hyperdensity.  IMPRESSION: Stable noncontrast CT appearance of the brain with chronic periventricular white matter changes.  Original Report Authenticated By: Randall An, M.D.    Significant Diagnostic Studies: CBC Lab Results  Component Value Date   WBC PENDING 09/17/2011   HGB 7.6* 09/17/2011   HCT 24.1* 09/17/2011   MCV 93.1 09/17/2011   PLT PENDING 09/17/2011    BMET    Component Value Date/Time   NA 139 09/17/2011 0445   K 4.3 09/17/2011 0445   CL 97 09/17/2011 0445   CO2 34* 09/17/2011 0445   GLUCOSE 133* 09/17/2011 0445   BUN 24* 09/17/2011 0445   CREATININE 0.78 09/17/2011 0445   CALCIUM  8.3* 09/17/2011 0445   GFRNONAA 88* 09/17/2011 0445   GFRAA >90 09/17/2011 0445    COAG Lab Results  Component Value Date   INR 1.48 09/09/2011   INR 1.63* 09/09/2011   INR 1.00 09/07/2011   No results found for this basename: PTT      Intake/Output Summary (Last 24 hours) at 09/17/11 0749 Last data filed at 09/17/11 0700  Gross per 24 hour  Intake 1600.88 ml  Output   1460 ml  Net 140.88 ml    Physical Exam:  BP Readings from Last 3 Encounters:  09/17/11 110/79  09/17/11 110/79  09/07/11 162/59   Temp Readings from Last 3 Encounters:  09/17/11 98.1 F (36.7 C) Oral  09/17/11 98.1 F (36.7 C) Oral  09/07/11 97.7 F (36.5 C) Oral   SpO2 Readings from Last 3 Encounters:  09/17/11 99%  09/17/11 99%  09/07/11 96%   Pulse Readings from Last 3 Encounters:  09/17/11 68  09/17/11 68  09/07/11 57   Right neck incision C/D/I Patient alert  Bilateral upper extremity pulses intact and equal    Plan: Discharge to: Home when stable per cardiac team and confusion has subsided.  Laurence Slate [facility] [id number] 09/17/2011 7:49 AM

## 2011-09-17 NOTE — Progress Notes (Signed)
PT Cancellation Note  Treatment cancelled today due to medical issues with patient which prohibited therapy.  Patient was too lethargic per nursing and confused.  Will attempt to return in pm.  Thanks.   INGOLD,Kallista Pae 09/17/2011, 11:33 AM  [facility] [phone] (pager)

## 2011-09-17 NOTE — Progress Notes (Signed)
Pt bp labile due to him tensing his arms when cuff inflates, pt confused and unable to reason with concerning his tense arms.

## 2011-09-17 NOTE — Progress Notes (Signed)
8 Days Post-Op Procedure(s) (LRB): ENDARTERECTOMY CAROTID (Right) CORONARY ARTERY BYPASS GRAFTING (CABG) (N/A) AORTA -INNOMIATE BYPASS () Subjective: No complaints  Objective: Vital signs in last 24 hours: Temp:  [97.6 F (36.4 C)-98.6 F (37 C)] 98.6 F (37 C) (04/25 1300) Pulse Rate:  [32-117] 62  (04/25 1330) Cardiac Rhythm:  [-] Normal sinus rhythm (04/25 0800) Resp:  [16-35] 30  (04/25 1330) BP: (102-197)/(15-133) 179/77 mmHg (04/25 1330) SpO2:  [84 %-100 %] 94 % (04/25 1330) Weight:  [130.7 kg (288 lb 2.3 oz)] 130.7 kg (288 lb 2.3 oz) (04/25 0600)  Hemodynamic parameters for last 24 hours:    Intake/Output from previous day: 04/24 0701 - 04/25 0700 In: 1600.9 [P.O.:200; I.V.:1384.9; IV Piggyback:16] Out: 1460 [Urine:1460] Intake/Output this shift: Total I/O In: 474 [P.O.:60; I.V.:20; Blood:390; IV Piggyback:4] Out: 300 [Urine:300]  General appearance: delirious and slowed mentation Neurologic: intact Heart: regular rate and rhythm, S1, S2 normal, no murmur, click, rub or gallop Lungs: clear to auscultation bilaterally Abdomen: soft, non-tender; bowel sounds normal; no masses,  no organomegaly Extremities: edema mild Wound: some serosanguinous drainage from leg incisions. Chest incision ok  Lab Results:  Basename 09/17/11 0445 09/15/11 0903  WBC 6.5 9.2  HGB 7.6* 8.1*  HCT 24.1* 25.2*  PLT 321 222   BMET:  Basename 09/17/11 0445 09/15/11 0903  NA 139 139  K 4.3 4.3  CL 97 100  CO2 34* 29  GLUCOSE 133* 141*  BUN 24* 24*  CREATININE 0.78 0.68  CALCIUM 8.3* 8.4    PT/INR: No results found for this basename: LABPROT,INR in the last 72 hours ABG    Component Value Date/Time   PHART 7.414 09/11/2011 1231   HCO3 23.9 09/11/2011 1231   TCO2 25 09/11/2011 1231   ACIDBASEDEF 1.0 09/11/2011 1231   O2SAT 91.0 09/11/2011 1231   CBG (last 3)   Basename 09/17/11 1136 09/17/11 0732 09/16/11 2115  GLUCAP 153* 153* 132*   CXR:  Left base  atelectasis Assessment/Plan: S/P Procedure(s) (LRB): ENDARTERECTOMY CAROTID (Right) CORONARY ARTERY BYPASS GRAFTING (CABG) (N/A) AORTA -INNOMIATE BYPASS () Diuresis Diabetes control Acute blood loss anemia:  Will transfuse a unit of PRBC's HTN:  Increase Norvasc to 10 and Altace to 10 bid.   LOS: 8 days    [name] K 09/17/2011

## 2011-09-17 NOTE — Progress Notes (Signed)
Physical Therapy Cancellation Note: attempted to see pt at 1505 but lethargic and confused actively being put in mittens secondary to unable to follow commands. Pt still not appropriate for therapy this afternoon. Thanks Elwyn Reach, [place]

## 2011-09-18 LAB — CBC
HCT: 32.2 % — ABNORMAL LOW (ref 39.0–52.0)
Hemoglobin: 10.1 g/dL — ABNORMAL LOW (ref 13.0–17.0)
RDW: 18.1 % — ABNORMAL HIGH (ref 11.5–15.5)
WBC: 8.1 10*3/uL (ref 4.0–10.5)

## 2011-09-18 LAB — TYPE AND SCREEN: Unit division: 0

## 2011-09-18 LAB — BASIC METABOLIC PANEL
BUN: 19 mg/dL (ref 6–23)
Chloride: 96 mEq/L (ref 96–112)
GFR calc Af Amer: >90 mL/min (ref >90)
GFR calc non Af Amer: 89 mL/min — ABNORMAL LOW (ref >90)
Glucose, Bld: 111 mg/dL — ABNORMAL HIGH (ref 70–99)
Potassium: 3.9 mEq/L (ref 3.5–5.1)

## 2011-09-18 LAB — GLUCOSE, CAPILLARY
Glucose-Capillary: 118 mg/dL — ABNORMAL HIGH (ref 70–99)
Glucose-Capillary: 124 mg/dL — ABNORMAL HIGH (ref 70–99)
Glucose-Capillary: 169 mg/dL — ABNORMAL HIGH (ref 70–99)

## 2011-09-18 MED ORDER — TEMAZEPAM 15 MG PO CAPS: 15.0000 mg | ORAL_CAPSULE | Freq: Every evening | ORAL | Status: DC | PRN

## 2011-09-18 NOTE — Progress Notes (Signed)
   CARE MANAGEMENT NOTE 09/18/2011  Patient:  [name], [name]   Account Number:  [id number]  Date Initiated:  09/14/2011  Documentation initiated by:  Edwar Coe  Subjective/Objective Assessment:   PT S/P CABG X3 ON 09/09/11.  PTA, PT INDEPENDENT, LIVES WITH SPOUSE.     Action/Plan:   WIFE AND CHILDREN TO PROVIDE 24 HR CARE AT DISCHARGE.  WILL FOLLOW FOR HOME NEEDS AS PT PROGRESSES.   Anticipated DC Date:  09/17/2011   Anticipated DC Plan:  [place]  CM consult      Choice offered to / List presented to:             Status of service:  In process, will continue to follow Medicare Important Message given?   (If response is "NO", the following Medicare IM given date fields will be blank) Date Medicare IM given:   Date Additional Medicare IM given:    Discharge Disposition:    Per UR Regulation:    If discussed at Long Length of Stay Meetings, dates discussed:    Comments:  09/18/11 [employer].  PT RECOMMENDING IP REHAB.  WILL OBTAIN OT CONSULT.  WILL FOLLOW...MAY NEED SNF IF NOT A CANDIDATE FOR CIR.  WILL FOLLOW. Phone [phone]

## 2011-09-18 NOTE — Progress Notes (Signed)
9 Days Post-Op Procedure(s) (LRB): ENDARTERECTOMY CAROTID (Right) CORONARY ARTERY BYPASS GRAFTING (CABG) (N/A) AORTA -INNOMIATE BYPASS () Subjective: No complaints  Objective: Vital signs in last 24 hours: Temp:  [97.6 F (36.4 C)-99.3 F (37.4 C)] 99 F (37.2 C) (04/26 0732) Pulse Rate:  [32-98] 71  (04/26 0700) Cardiac Rhythm:  [-] Normal sinus rhythm (04/26 0000) Resp:  [16-35] 24  (04/26 0700) BP: (73-210)/(15-139) 108/52 mmHg (04/26 0700) SpO2:  [84 %-100 %] 100 % (04/26 0700) Weight:  [125.6 kg (276 lb 14.4 oz)] 125.6 kg (276 lb 14.4 oz) (04/26 0700)  Hemodynamic parameters for last 24 hours:    Intake/Output from previous day: 04/25 0701 - 04/26 0700 In: 657 [P.O.:60; I.V.:70; Blood:515; IV Piggyback:12] Out: 5070 [Urine:5070] Intake/Output this shift:    General appearance: cooperative and slowed mentation Neurologic: intact Heart: regular rate and rhythm, S1, S2 normal, no murmur, click, rub or gallop Lungs: clear to auscultation bilaterally Extremities: edema mild swelling left hand, otherwise no edema Wound: chest incision ok.  leg incisions ok. drainage minimal  Lab Results:  Basename 09/18/11 0405 09/17/11 0445  WBC 8.1 6.5  HGB 10.1* 7.6*  HCT 32.2* 24.1*  PLT 333 321   BMET:  Basename 09/18/11 0405 09/17/11 0445  NA 141 139  K 3.9 4.3  CL 96 97  CO2 37* 34*  GLUCOSE 111* 133*  BUN 19 24*  CREATININE 0.76 0.78  CALCIUM 8.9 8.3*    PT/INR: No results found for this basename: LABPROT,INR in the last 72 hours ABG    Component Value Date/Time   PHART 7.414 09/11/2011 1231   HCO3 23.9 09/11/2011 1231   TCO2 25 09/11/2011 1231   ACIDBASEDEF 1.0 09/11/2011 1231   O2SAT 91.0 09/11/2011 1231   CBG (last 3)   Basename 09/18/11 0734 09/17/11 2130 09/17/11 1557  GLUCAP 118* 123* 129*    Assessment/Plan: S/P Procedure(s) (LRB): ENDARTERECTOMY CAROTID (Right) CORONARY ARTERY BYPASS GRAFTING (CABG) (N/A) AORTA -INNOMIATE BYPASS () Mobilize with  physical therapy Diabetes control He has diuresed below preop wt so will stop lasix for now. Postop delerium: improved   LOS: 9 days    Jacobus Colvin K 09/18/2011

## 2011-09-18 NOTE — Progress Notes (Signed)
Physical Therapy Treatment Patient Details Name: [name] MRN: [id number] DOB: [birth year] Today's Date: 09/18/2011 Time: [id number] PT Time Calculation (min): 17 min  PT Assessment / Plan / Recommendation Comments on Treatment Session  Pt s/p CABG, aorta bypass and CEA with AMS limiting safe mobility. Pt unable to recall any precautions or follow commands for LE HEP in sitting. In standing pt maintains alertness but once sitting returns to sleeping. Pt educated for precautions but unable to recall.     Follow Up Recommendations       Equipment Recommendations       Frequency     Plan Discharge plan remains appropriate    Precautions / Restrictions Precautions Precautions: Sternal;Fall Restrictions Weight Bearing Restrictions: No   Pertinent Vitals/Pain None reported HR 86-92 throughout session    Mobility  Transfers Transfers: Sit to Stand;Stand to Sit Sit to Stand: 4: Min assist;From chair/3-in-1 Stand to Sit: 4: Min assist;To chair/3-in-1 Details for Transfer Assistance: cueing for hand placement on thighs, anterior weight shift and assist to position self and surface and control descent before sitting Ambulation/Gait Ambulation/Gait Assistance: 1: +2 Total assist (2 people for chair, lines, safety) Ambulation/Gait: Patient Percentage: 80 Ambulation Distance (Feet): 300 Feet Assistive device: Rolling walker Ambulation/Gait Assistance Details: Assist to turn and direct RW as well as cueing for stepping into RW and extended trunk  Gait Pattern: Step-through pattern;Decreased stride length Stairs: No    Exercises General Exercises - Lower Extremity Long Arc Quad: AAROM;Right;5 reps   PT Goals Acute Rehab PT Goals PT Goal: Sit to Stand - Progress: Progressing toward goal PT Goal: Stand to Sit - Progress: Progressing toward goal PT Transfer Goal: Bed to Chair/Chair to Bed - Progress: Progressing toward goal PT Goal: Ambulate - Progress: Progressing toward  goal PT Goal: Up/Down Stairs - Progress: Progressing toward goal  Visit Information  Last PT Received On: 09/18/11 Assistance Needed: +2    Subjective Data  Subjective: "so, am I ready to go or what?"   Cognition  Overall Cognitive Status: Impaired Area of Impairment: Memory;Safety/judgement;Following commands;Problem solving;Awareness of errors Arousal/Alertness: Awake/alert Orientation Level: Disoriented X4;Situation;Time Behavior During Session: [facility] for tasks performed Current Attention Level: Sustained Memory: Decreased recall of precautions Following Commands: Follows one step commands consistently Safety/Judgement: Decreased safety judgement for tasks assessed;Decreased awareness of need for assistance    Balance     End of Session PT - End of Session Equipment Utilized During Treatment: Gait belt Activity Tolerance: Patient tolerated treatment well Patient left: in chair;with call bell/phone within [name];Other (comment) (with sitter present) Nurse Communication: Mobility status    [name] 09/18/2011, 9:44 AM [name], [place]

## 2011-09-18 NOTE — Progress Notes (Signed)
[facility] [place]  Subjective  - Still confused, alert and oriented x person, sometimes place  Objective 145/48 76 99 F (37.2 C) (Oral) 29 100%  Intake/Output Summary (Last 24 hours) at 09/18/11 1028 Last data filed at 09/18/11 1015  Gross per 24 hour  Intake   1053 ml  Output   4770 ml  Net  -3717 ml   Right neck incision healing, Motor UE/LE intact Assessment/Planning: Post right CEA and innominate bypass Post op delirium Cont to follow  Charon Smedberg E 09/18/2011 10:28 AM --  Laboratory Lab Results:  Basename 09/18/11 0405 09/17/11 0445  WBC 8.1 6.5  HGB 10.1* 7.6*  HCT 32.2* 24.1*  PLT 333 321   BMET  Basename 09/18/11 0405 09/17/11 0445  NA 141 139  K 3.9 4.3  CL 96 97  CO2 37* 34*  GLUCOSE 111* 133*  BUN 19 24*  CREATININE 0.76 0.78  CALCIUM 8.9 8.3*    COAG Lab Results  Component Value Date   INR 1.48 09/09/2011   INR 1.63* 09/09/2011   INR 1.00 09/07/2011   No results found for this basename: PTT

## 2011-09-18 NOTE — Progress Notes (Signed)
Patient ID: [name], male   DOB: 1940-07-07, 71 y.o.   MRN: [id number] Some confusion  BP 122/55  Pulse 69  Temp(Src) 98.6 F (37 C) (Oral)  Resp 27  Ht 5' 10.87" (1.8 m)  Wt 276 lb 14.4 oz (125.6 kg)  BMI 38.77 kg/m2  SpO2 93%  Intake/Output Summary (Last 24 hours) at 09/18/11 1840 Last data filed at 09/18/11 1418  Gross per 24 hour  Intake    720 ml  Output   2240 ml  Net  -1520 ml  continue present care

## 2011-09-18 NOTE — Progress Notes (Signed)
Patient Name: [name] Date of Encounter: 09/18/2011    SUBJECTIVE:Confused but no complaints of pain or dyspnea.  TELEMETRY:  No atrial fibrillation: Filed Vitals:   09/18/11 0630 09/18/11 0700 09/18/11 0730 09/18/11 0732  BP: 148/52 108/52 124/58   Pulse: 65 71 71   Temp:    99 F (37.2 C)  TempSrc:    Oral  Resp: 26 24 30    Height:      Weight:  125.6 kg (276 lb 14.4 oz)    SpO2: 94% 100% 100%     Intake/Output Summary (Last 24 hours) at 09/18/11 0919 Last data filed at 09/18/11 0600  Gross per 24 hour  Intake    573 ml  Output   5070 ml  Net  -4497 ml    LABS: Basic Metabolic Panel:  Basename 09/18/11 0405 09/17/11 0445  NA 141 139  K 3.9 4.3  CL 96 97  CO2 37* 34*  GLUCOSE 111* 133*  BUN 19 24*  CREATININE 0.76 0.78  CALCIUM 8.9 8.3*  MG -- --  PHOS -- --   CBC:  Basename 09/18/11 0405 09/17/11 0445  WBC 8.1 6.5  NEUTROABS -- --  HGB 10.1* 7.6*  HCT 32.2* 24.1*  MCV 92.3 93.1  PLT 333 321    Radiology/Studies:  Port CXR : IMPRESSION:  Worsened bibasilar pulmonary opacities, likely atelectasis. No  pneumothorax.    Physical Exam: Blood pressure 124/58, pulse 71, temperature 99 F (37.2 C), temperature source Oral, resp. rate 30, height 5' 10.87" (1.8 m), weight 125.6 kg (276 lb 14.4 oz), SpO2 100.00%. Weight change: -5.1 kg (-11 lb 3.9 oz)   No rub or gallop.  ASSESSMENT:  1. Blood pressure now well controlled.   Plan:  1. Continue current therapy 2. Will see as needed.  Demetrios Isaacs 09/18/2011, 9:19 AM

## 2011-09-19 LAB — GLUCOSE, CAPILLARY

## 2011-09-19 MED ORDER — PHENOL 1.4 % MT LIQD: 1.0000 | OROMUCOSAL | Status: DC | PRN

## 2011-09-19 MED ORDER — MENTHOL 3 MG MT LOZG
1.0000 | LOZENGE | OROMUCOSAL | Status: DC | PRN
  Filled 2011-09-19: qty 9

## 2011-09-19 NOTE — Progress Notes (Signed)
10 Days Post-Op Procedure(s) (LRB): ENDARTERECTOMY CAROTID (Right) CORONARY ARTERY BYPASS GRAFTING (CABG) (N/A) AORTA -INNOMIATE BYPASS () Subjective: Alert Appropriate in conversation  Objective: Vital signs in last 24 hours: Temp:  [98.4 F (36.9 C)-99.2 F (37.3 C)] 99.2 F (37.3 C) (04/27 0811) Pulse Rate:  [62-90] 75  (04/27 0900) Cardiac Rhythm:  [-] Normal sinus rhythm (04/27 0700) Resp:  [16-31] 24  (04/27 0900) BP: (92-186)/(28-84) 101/51 mmHg (04/27 0900) SpO2:  [90 %-100 %] 100 % (04/27 0900) Weight:  [286 lb 2.5 oz (129.8 kg)] 286 lb 2.5 oz (129.8 kg) (04/27 0600)  Hemodynamic parameters for last 24 hours:    Intake/Output from previous day: 04/26 0701 - 04/27 0700 In: 930 [P.O.:930] Out: 900 [Urine:900] Intake/Output this shift:    General appearance: alert and no distress Neurologic: intact and oriented Heart: regular rate and rhythm Lungs: diminished breath sounds bibasilar Abdomen: normal findings: soft, non-tender Extremities: edema trace Wound: clean and dry  Lab Results:  Basename 09/18/11 0405 09/17/11 0445  WBC 8.1 6.5  HGB 10.1* 7.6*  HCT 32.2* 24.1*  PLT 333 321   BMET:  Basename 09/18/11 0405 09/17/11 0445  NA 141 139  K 3.9 4.3  CL 96 97  CO2 37* 34*  GLUCOSE 111* 133*  BUN 19 24*  CREATININE 0.76 0.78  CALCIUM 8.9 8.3*    PT/INR: No results found for this basename: LABPROT,INR in the last 72 hours ABG    Component Value Date/Time   PHART 7.414 09/11/2011 1231   HCO3 23.9 09/11/2011 1231   TCO2 25 09/11/2011 1231   ACIDBASEDEF 1.0 09/11/2011 1231   O2SAT 91.0 09/11/2011 1231   CBG (last 3)   Basename 09/19/11 0809 09/18/11 2237 09/18/11 1529  GLUCAP 156* 146* 169*    Assessment/Plan: S/P Procedure(s) (LRB): ENDARTERECTOMY CAROTID (Right) CORONARY ARTERY BYPASS GRAFTING (CABG) (N/A) AORTA -INNOMIATE BYPASS () - Confusion continues to improve CV- stable RESP -pulmonary toilet RENAL - suspect yesterday's weight  inaccurate, still below preop CBG reasonable control Deconditioning- continue ambulation   LOS: 10 days    [name] C 09/19/2011

## 2011-09-19 NOTE — Progress Notes (Signed)
   Subjective:   Less "groggy", less combative. Confusion mildly improved.      Objective:  Vital Signs in the last 24 hours: Temp:  [98.4 F (36.9 C)-99.2 F (37.3 C)] 99.2 F (37.3 C) (04/27 0811) Pulse Rate:  [62-90] 75  (04/27 0900) Resp:  [16-31] 24  (04/27 0900) BP: (92-186)/(28-84) 101/51 mmHg (04/27 0900) SpO2:  [90 %-100 %] 100 % (04/27 0900) Weight:  [129.8 kg (286 lb 2.5 oz)] 129.8 kg (286 lb 2.5 oz) (04/27 0600)  Intake/Output from previous day: 04/26 0701 - 04/27 0700 In: 8 [P.O.:930] Out: 900 [Urine:900]  GEN: more alert, in NAD, in chair CV: RRR no m/r/g Lungs: clear bilat.   Hg 10.1 Creat 0.76  Assessment/Plan:  HTN - better control.  NSR CAD-CABG HL - meds reviewed. Crestor , fenofibrate. Goal LDL 70.  Endarterectomy Confusion - hopefully improving.   Let us know if we can be of assistance.   [name], [place] 09/19/2011, 9:14 AM

## 2011-09-19 NOTE — Progress Notes (Signed)
Patient ID: [name], male   DOB: 05-02-1941, 71 y.o.   MRN: [id number] Ambulated 3 x today BP 140/48  Pulse 75  Temp(Src) 97.6 F (36.4 C) (Oral)  Resp 13  Ht 5' 10.87" (1.8 m)  Wt 286 lb 2.5 oz (129.8 kg)  BMI 40.06 kg/m2  SpO2 98%  Intake/Output Summary (Last 24 hours) at 09/19/11 1740 Last data filed at 09/19/11 1700  Gross per 24 hour  Intake    720 ml  Output   1035 ml  Net   -315 ml   Continues to slowly improve

## 2011-09-20 LAB — CBC
HCT: 30.4 % — ABNORMAL LOW (ref 39.0–52.0)
MCH: 28.4 pg (ref 26.0–34.0)
MCV: 91.8 fL (ref 78.0–100.0)
Platelets: 333 10*3/uL (ref 150–400)
RBC: 3.31 MIL/uL — ABNORMAL LOW (ref 4.22–5.81)

## 2011-09-20 LAB — GLUCOSE, CAPILLARY
Glucose-Capillary: 168 mg/dL — ABNORMAL HIGH (ref 70–99)
Glucose-Capillary: 158 mg/dL — ABNORMAL HIGH (ref 70–99)

## 2011-09-20 LAB — BASIC METABOLIC PANEL
BUN: 27 mg/dL — ABNORMAL HIGH (ref 6–23)
CO2: 35 mEq/L — ABNORMAL HIGH (ref 19–32)
Calcium: 8.9 mg/dL (ref 8.4–10.5)
Creatinine, Ser: 0.88 mg/dL (ref 0.50–1.35)
Glucose, Bld: 150 mg/dL — ABNORMAL HIGH (ref 70–99)

## 2011-09-20 MED ORDER — BENZONATATE 100 MG PO CAPS
200.0000 mg | ORAL_CAPSULE | Freq: Three times a day (TID) | ORAL | Status: DC | PRN
  Filled 2011-09-20: qty 2

## 2011-09-20 MED ORDER — GUAIFENESIN ER 600 MG PO TB12
1200.0000 mg | ORAL_TABLET | Freq: Two times a day (BID) | ORAL | Status: AC
  Administered 2011-09-20 – 2011-09-24 (×10): 1200 mg via ORAL
  Filled 2011-09-20 (×10): qty 2

## 2011-09-20 NOTE — Progress Notes (Signed)
Patient ID: [name], male   DOB: 1940/11/21, 70 y.o.   MRN: [id number] Asking questions about possible "brain injury" Explained postop delirium He c/o some short term memory lapses. BP 146/48  Pulse 64  Temp(Src) 99.1 F (37.3 C) (Oral)  Resp 17  Ht 5' 10.87" (1.8 m)  Wt 287 lb 4.2 oz (130.3 kg)  BMI 40.22 kg/m2  SpO2 95%  Intake/Output Summary (Last 24 hours) at 09/20/11 1825 Last data filed at 09/20/11 1800  Gross per 24 hour  Intake    900 ml  Output   1555 ml  Net   -655 ml   Confusion continues to improve Continue current care

## 2011-09-20 NOTE — Progress Notes (Signed)
11 Days Post-Op Procedure(s) (LRB): ENDARTERECTOMY CAROTID (Right) CORONARY ARTERY BYPASS GRAFTING (CABG) (N/A) AORTA -INNOMIATE BYPASS () Subjective: Some confusion still persists, but continues to slowly improve Denies pain  Objective: Vital signs in last 24 hours: Temp:  [97.6 F (36.4 C)-99.1 F (37.3 C)] 97.7 F (36.5 C) (04/28 0730) Pulse Rate:  [63-75] 72  (04/28 0800) Cardiac Rhythm:  [-] Normal sinus rhythm (04/28 0800) Resp:  [13-34] 19  (04/28 0800) BP: (63-158)/(34-89) 155/62 mmHg (04/28 0800) SpO2:  [90 %-99 %] 96 % (04/28 0800) Weight:  [287 lb 4.2 oz (130.3 kg)] 287 lb 4.2 oz (130.3 kg) (04/28 0600)  Hemodynamic parameters for last 24 hours:    Intake/Output from previous day: 04/27 0701 - 04/28 0700 In: 810 [P.O.:810] Out: 1485 [Urine:1485] Intake/Output this shift: Total I/O In: 360 [P.O.:360] Out: 30 [Urine:30]  Neurologic: right sided weakness Heart: regular rate and rhythm, S1, S2 normal, no murmur, click, rub or gallop Lungs: diminished breath sounds bibasilar Extremities: left arm edematous, brisk cap refill, neuro intact Wound: clean and dry  Lab Results:  Basename 09/20/11 0412 09/18/11 0405  WBC 10.4 8.1  HGB 9.4* 10.1*  HCT 30.4* 32.2*  PLT 333 333   BMET:  Basename 09/20/11 0412 09/18/11 0405  NA 137 141  K 4.0 3.9  CL 94* 96  CO2 35* 37*  GLUCOSE 150* 111*  BUN 27* 19  CREATININE 0.88 0.76  CALCIUM 8.9 8.9    PT/INR: No results found for this basename: LABPROT,INR in the last 72 hours ABG    Component Value Date/Time   PHART 7.414 09/11/2011 1231   HCO3 23.9 09/11/2011 1231   TCO2 25 09/11/2011 1231   ACIDBASEDEF 1.0 09/11/2011 1231   O2SAT 91.0 09/11/2011 1231   CBG (last 3)   Basename 09/20/11 0728 09/19/11 2132 09/19/11 1654  GLUCAP 168* 154* 132*    Assessment/Plan: S/P Procedure(s) (LRB): ENDARTERECTOMY CAROTID (Right) CORONARY ARTERY BYPASS GRAFTING (CABG) (N/A) AORTA -INNOMIATE BYPASS () - Continues to make  slow progress Confusion persists but is markedly improved Left arm edema present since surgery, no evidence of neuro or vascular compromise, elevate CV- stable RESP- persistent cough will address RENAL- BUN up creatinine stable, off diuretics   LOS: 11 days    HENDRICKSON,STEVEN C 09/20/2011

## 2011-09-21 ENCOUNTER — Inpatient Hospital Stay (HOSPITAL_COMMUNITY): Payer: Medicare Other

## 2011-09-21 DIAGNOSIS — M7989 Other specified soft tissue disorders: Secondary | ICD-10-CM

## 2011-09-21 LAB — BASIC METABOLIC PANEL
BUN: 22 mg/dL (ref 6–23)
CO2: 37 mEq/L — ABNORMAL HIGH (ref 19–32)
Calcium: 8.9 mg/dL (ref 8.4–10.5)
Glucose, Bld: 141 mg/dL — ABNORMAL HIGH (ref 70–99)
Potassium: 3.7 mEq/L (ref 3.5–5.1)
Sodium: 136 mEq/L (ref 135–145)

## 2011-09-21 LAB — GLUCOSE, CAPILLARY
Glucose-Capillary: 146 mg/dL — ABNORMAL HIGH (ref 70–99)
Glucose-Capillary: 146 mg/dL — ABNORMAL HIGH (ref 70–99)
Glucose-Capillary: 178 mg/dL — ABNORMAL HIGH (ref 70–99)

## 2011-09-21 LAB — CBC
Hemoglobin: 9.9 g/dL — ABNORMAL LOW (ref 13.0–17.0)
MCH: 29.7 pg (ref 26.0–34.0)
MCHC: 32 g/dL (ref 30.0–36.0)
MCV: 92.8 fL (ref 78.0–100.0)
RBC: 3.33 MIL/uL — ABNORMAL LOW (ref 4.22–5.81)

## 2011-09-21 MED ORDER — SODIUM CHLORIDE 0.9 % IV SOLN: 250.0000 mL | INTRAVENOUS | Status: DC | PRN

## 2011-09-21 MED ORDER — INSULIN ASPART 100 UNIT/ML ~~LOC~~ SOLN
0.0000 [IU] | Freq: Three times a day (TID) | SUBCUTANEOUS | Status: DC
  Administered 2011-09-21: 4 [IU] via SUBCUTANEOUS
  Administered 2011-09-21: 2 [IU] via SUBCUTANEOUS
  Administered 2011-09-22 (×2): 4 [IU] via SUBCUTANEOUS
  Administered 2011-09-22: 2 [IU] via SUBCUTANEOUS
  Administered 2011-09-22 – 2011-09-23 (×3): 4 [IU] via SUBCUTANEOUS
  Administered 2011-09-23 – 2011-09-24 (×4): 2 [IU] via SUBCUTANEOUS
  Administered 2011-09-24: 4 [IU] via SUBCUTANEOUS
  Administered 2011-09-25: 2 [IU] via SUBCUTANEOUS
  Administered 2011-09-25: 4 [IU] via SUBCUTANEOUS
  Administered 2011-09-26 (×2): 2 [IU] via SUBCUTANEOUS
  Administered 2011-09-26: 4 [IU] via SUBCUTANEOUS
  Administered 2011-09-27 – 2011-09-28 (×3): 2 [IU] via SUBCUTANEOUS
  Administered 2011-09-28: 4 [IU] via SUBCUTANEOUS
  Administered 2011-09-29: 2 [IU] via SUBCUTANEOUS

## 2011-09-21 MED ORDER — SODIUM CHLORIDE 0.9 % IJ SOLN
3.0000 mL | Freq: Two times a day (BID) | INTRAMUSCULAR | Status: DC
  Administered 2011-09-21 – 2011-09-29 (×7): 3 mL via INTRAVENOUS

## 2011-09-21 MED ORDER — MOVING RIGHT ALONG BOOK
Freq: Once | Status: AC
  Administered 2011-09-21: 11:00:00
  Filled 2011-09-21: qty 1

## 2011-09-21 MED ORDER — PANTOPRAZOLE SODIUM 40 MG PO TBEC
40.0000 mg | DELAYED_RELEASE_TABLET | Freq: Every day | ORAL | Status: DC
  Administered 2011-09-22 – 2011-09-29 (×8): 40 mg via ORAL
  Filled 2011-09-21 (×8): qty 1

## 2011-09-21 MED ORDER — TRAMADOL HCL 50 MG PO TABS
50.0000 mg | ORAL_TABLET | ORAL | Status: DC | PRN
  Administered 2011-09-25: 100 mg via ORAL
  Administered 2011-09-25 – 2011-09-28 (×5): 50 mg via ORAL
  Administered 2011-09-28 – 2011-09-29 (×2): 100 mg via ORAL
  Filled 2011-09-21 (×3): qty 1
  Filled 2011-09-21: qty 2
  Filled 2011-09-21 (×2): qty 1
  Filled 2011-09-21: qty 2
  Filled 2011-09-21: qty 1
  Filled 2011-09-21: qty 2

## 2011-09-21 MED ORDER — METOPROLOL TARTRATE 25 MG PO TABS
25.0000 mg | ORAL_TABLET | Freq: Two times a day (BID) | ORAL | Status: DC
  Administered 2011-09-21 – 2011-09-25 (×10): 25 mg via ORAL
  Filled 2011-09-21 (×12): qty 1

## 2011-09-21 MED ORDER — SODIUM CHLORIDE 0.9 % IJ SOLN: 3.0000 mL | INTRAMUSCULAR | Status: DC | PRN

## 2011-09-21 MED ORDER — HEPARIN (PORCINE) IN NACL 100-0.45 UNIT/ML-% IJ SOLN
1700.0000 [IU]/h | INTRAMUSCULAR | Status: DC
  Administered 2011-09-21 – 2011-09-25 (×6): 1400 [IU]/h via INTRAVENOUS
  Administered 2011-09-26: 1700 [IU]/h via INTRAVENOUS
  Administered 2011-09-26: 1400 [IU]/h via INTRAVENOUS
  Filled 2011-09-21 (×15): qty 250

## 2011-09-21 MED ORDER — ONDANSETRON HCL 4 MG/2ML IJ SOLN: 4.0000 mg | Freq: Four times a day (QID) | INTRAMUSCULAR | Status: DC | PRN

## 2011-09-21 MED ORDER — ACETAMINOPHEN 325 MG PO TABS
650.0000 mg | ORAL_TABLET | Freq: Four times a day (QID) | ORAL | Status: DC | PRN
  Administered 2011-09-26 – 2011-09-27 (×4): 650 mg via ORAL
  Filled 2011-09-21 (×4): qty 2

## 2011-09-21 MED ORDER — ONDANSETRON HCL 4 MG PO TABS: 4.0000 mg | ORAL_TABLET | Freq: Four times a day (QID) | ORAL | Status: DC | PRN

## 2011-09-21 NOTE — Progress Notes (Signed)
ANTICOAGULATION CONSULT NOTE - Follow Up Consult  Pharmacy Consult for Heparin Indication: DVT (LUE)  Assessment: 71 yo male s/p CABG and R CEA on 4/18 found to have a LUE DVT and heparin therapy was initiated today. First heparin level is therapeutic. No bleeding reported.   Goal of Therapy:  Heparin level 0.3-0.7 units/ml   Plan:  1. Continue heparin at 1400 units/hr 2. Confirmatory heparin level with am labs 3. Daily heparin level and CBC   Allergies  Allergen Reactions  . Amoxicillin-Pot Clavulanate Nausea Only    Patient Measurements: Height: 5' 10.87" (180 cm) (on 09/07/11) Weight: 286 lb 6 oz (129.9 kg) IBW/kg (Calculated) : 74.99  Heparin Dosing Weight: 105 kg  Vital Signs: Temp: 99.8 F (37.7 C) (04/29 2045) Temp src: Oral (04/29 2045) BP: 152/56 mmHg (04/29 2045) Pulse Rate: 71  (04/29 2045)  Labs:  Flo Shanks 09/21/11 2103 09/21/11 0332 09/20/11 0412  HGB -- 9.9* 9.4*  HCT -- 30.9* 30.4*  PLT -- 333 333  APTT -- -- --  LABPROT -- -- --  INR -- -- --  HEPARINUNFRC 0.60 -- --  CREATININE -- 0.83 0.88  CKTOTAL -- -- --  CKMB -- -- --  TROPONINI -- -- --   Estimated Creatinine Clearance: 110.4 ml/min (by C-G formula based on Cr of 0.83).   Medications:  Infusions:    . heparin 1,400 Units/hr (09/21/11 1500)    Renold Genta Danielle 09/21/2011,10:05 PM

## 2011-09-21 NOTE — Progress Notes (Signed)
Nutrition Follow-up  Patient states his appetite is good; much less confused. PO intake variable at 25-100% per flowsheet records. Continues to decline supplements at this time.  Diet Order:  Carbohydrate Modified Medium Calorie  Meds: Scheduled Meds:   . amLODipine  10 mg Oral Daily  . aspirin EC  325 mg Oral Daily  . docusate sodium  200 mg Oral Daily  . enoxaparin  40 mg Subcutaneous Daily  . fenofibrate  160 mg Oral Daily  . gabapentin  300 mg Oral BID  . guaiFENesin  1,200 mg Oral BID  . insulin aspart  0-24 Units Subcutaneous TID AC & HS  . insulin glargine  20 Units Subcutaneous Daily  . metoprolol tartrate  25 mg Oral BID  . moving right along book   Does not apply Once  . pantoprazole  40 mg Oral QAC breakfast  . ramipril  10 mg Oral BID  . rosuvastatin  10 mg Oral q1800  . sodium chloride  3 mL Intravenous Q12H  . thiamine  100 mg Oral Daily  . DISCONTD: insulin aspart  0-24 Units Subcutaneous TID AC & HS  . DISCONTD: metoprolol tartrate  25 mg Oral BID  . DISCONTD: moving right along book   Does not apply Once  . DISCONTD: mulitivitamin with minerals  1 tablet Oral Daily  . DISCONTD: pantoprazole  40 mg Oral QAC breakfast  . DISCONTD: sodium chloride  3 mL Intravenous Q12H   Continuous Infusions:  PRN Meds:.sodium chloride, acetaminophen, bisacodyl, bisacodyl, ondansetron (ZOFRAN) IV, ondansetron, sodium chloride, traMADol, DISCONTD: sodium chloride, DISCONTD: benzonatate, DISCONTD: hydrALAZINE, DISCONTD: menthol-cetylpyridinium, DISCONTD: ondansetron (ZOFRAN) IV, DISCONTD: ondansetron, DISCONTD: phenol, DISCONTD: sodium chloride, DISCONTD: temazepam, DISCONTD: traMADol  Labs:  CMP     Component Value Date/Time   NA 136 09/21/2011 0332   K 3.7 09/21/2011 0332   CL 93* 09/21/2011 0332   CO2 37* 09/21/2011 0332   GLUCOSE 141* 09/21/2011 0332   BUN 22 09/21/2011 0332   CREATININE 0.83 09/21/2011 0332   CALCIUM 8.9 09/21/2011 0332   PROT 6.5 09/07/2011 1647   ALBUMIN 3.2*  09/07/2011 1647   AST 14 09/07/2011 1647   ALT 16 09/07/2011 1647   ALKPHOS 56 09/07/2011 1647   BILITOT 0.2* 09/07/2011 1647   GFRNONAA 86* 09/21/2011 0332   GFRAA >90 09/21/2011 0332     Intake/Output Summary (Last 24 hours) at 09/21/11 1049 Last data filed at 09/21/11 0900  Gross per 24 hour  Intake    900 ml  Output   1795 ml  Net   -895 ml    CBG (last 3)   Basename 09/21/11 0748 09/20/11 2143 09/20/11 1644  GLUCAP 163* 167* 124*    Weight Status:  129.9 kg (4/29) -- down likely due to diuresis  Re-estimated needs:  2300-2500 kcals, 115-125 gm protein  Nutrition Dx:  Inadequate Oral Intake, ongoing  Goal:  Oral intake to meet >90% of estimated nutrition needs, unmet Monitor: PO intake, weight, labs, I/O's  Intervention:    Recommend Carbohydrate Modified High Calorie diet to better meet estimated nutrition needs  No nutrition intervention at this time -- pt declined  RD to follow for nutrition care plan  Lady Deutscher Pager #:  [phone]

## 2011-09-21 NOTE — Progress Notes (Signed)
Acknowledge order to DC foley; Pt has only a condom cath not an indwelling catheter.  Given Pt's persistent confusion and poor strength/balance, nursing decision in communication with family is to leave condom cath in place at this time.

## 2011-09-21 NOTE — Progress Notes (Signed)
PT Cancellation Note  Treatment cancelled today due to pt with DVT L Internal Jugular, Subclavian and Axillary.  Will hold PT at this time and check back another time.  MD Please advise activity level appropriate for pt.  Thanks.    Catarina Hartshorn, [place] 09/21/2011, 2:27 PM

## 2011-09-21 NOTE — Progress Notes (Addendum)
VASCULAR AND VEIN SURGERY POST - OP CEA PROGRESS NOTE  Date of Surgery: 09/09/2011 Surgeon: Surgeon(s): Gaye Pollack, MD Elam Dutch, MD 12 Days Post-Op right Carotid Endarterectomy with .INNOMIATE BYPASS.    HPI: [name] is a 71 y.o. male who is 12 Days Post-Op right Carotid Endarterectomy . Patient is doing well. Pre-operative symptoms are Improved Patient denies headache; Patient denies difficulty swallowing; denies weakness in upper or lower extremities; Pt. denies other symptoms of stroke or TIA.  IMAGING: Dg Chest Port 1 View  09/21/2011  *RADIOLOGY REPORT*  Clinical Data: Post CABG  PORTABLE CHEST - 1 VIEW  Comparison: Portable chest x-ray of 09/17/2011  Findings: The lungs are better aerated.  Cardiomegaly is stable. There may be mild atelectasis at the lung bases.  Median sternotomy sutures again are noted.  IMPRESSION: Improved aeration.  Only minimal basilar atelectasis remains.  Original Report Authenticated By: Joretta Bachelor, M.D.    Significant Diagnostic Studies: CBC Lab Results  Component Value Date   WBC 11.3* 09/21/2011   HGB 9.9* 09/21/2011   HCT 30.9* 09/21/2011   MCV 92.8 09/21/2011   PLT 333 09/21/2011    BMET    Component Value Date/Time   NA 136 09/21/2011 0332   K 3.7 09/21/2011 0332   CL 93* 09/21/2011 0332   CO2 37* 09/21/2011 0332   GLUCOSE 141* 09/21/2011 0332   BUN 22 09/21/2011 0332   CREATININE 0.83 09/21/2011 0332   CALCIUM 8.9 09/21/2011 0332   GFRNONAA 86* 09/21/2011 0332   GFRAA >90 09/21/2011 0332    COAG Lab Results  Component Value Date   INR 1.48 09/09/2011   INR 1.63* 09/09/2011   INR 1.00 09/07/2011   No results found for this basename: PTT      Intake/Output Summary (Last 24 hours) at 09/21/11 0814 Last data filed at 09/21/11 0600  Gross per 24 hour  Intake    900 ml  Output   1695 ml  Net   -795 ml    Physical Exam:  BP Readings from Last 3 Encounters:  09/21/11 134/65  09/21/11 134/65  09/07/11 162/59    Temp Readings from Last 3 Encounters:  09/21/11 97.9 F (36.6 C) Oral  09/21/11 97.9 F (36.6 C) Oral  09/07/11 97.7 F (36.5 C) Oral   SpO2 Readings from Last 3 Encounters:  09/21/11 96%  09/21/11 96%  09/07/11 96%   Pulse Readings from Last 3 Encounters:  09/21/11 83  09/21/11 83  09/07/11 57    Pt is Alert and following instruction, still confused Speech is normal right Neck Wound is clean, dry, intact or healing well Patient with Negative tongue deviation and Negative facial droop Pt has good and equal strength in all extremities  Assessment: [name] is a 71 y.o. male is S/P Right Carotid endarterectomy Pt is voiding, ambulating and taking po well   Plan: Discharge to: Pending plan Follow-up in 3 weeks after discharge   Laurence Slate [facility] [id number] 09/21/2011 8:14 AM   Confusion slowly improving, oriented today to person and situation Persistant left arm edema, left hand warm,edema from left hand to shoulder Incisions healing, Neuro motor sensory intact  A/p Overall delirium improving Will duplex left arm to rule out dvt  Ruta Hinds, MD [facility] [place] Office: [phone] Pager: [phone]  Ultrasound shows left arm DVT Will start heparin May need central access if we cannot find a peripheral IV Family updated  Ruta Hinds, MD  [facility] [place] Office: [phone] Pager: [phone]

## 2011-09-21 NOTE — Progress Notes (Signed)
Patient ID: [name], male   DOB: [birth year], 71 y.o.   MRN: [id number] TCTS DAILY PROGRESS NOTE                   [place]            [place] [address]          [phone]      12 Days Post-Op Procedure(s) (LRB): ENDARTERECTOMY CAROTID (Right) CORONARY ARTERY BYPASS GRAFTING (CABG) (N/A) AORTA -INNOMIATE BYPASS ()  Total Length of Stay:  LOS: 12 days   Subjective: Much less confused, aware of person and place  Objective: Vital signs in last 24 hours: Temp:  [97.9 F (36.6 C)-99.1 F (37.3 C)] 97.9 F (36.6 C) (04/29 0747) Pulse Rate:  [63-83] 83  (04/29 0700) Cardiac Rhythm:  [-] Normal sinus rhythm (04/29 0700) Resp:  [12-27] 16  (04/29 0700) BP: (124-169)/(26-92) 134/65 mmHg (04/29 0700) SpO2:  [90 %-98 %] 96 % (04/29 0700) Weight:  [286 lb 6 oz (129.9 kg)] 286 lb 6 oz (129.9 kg) (04/29 0600)  Filed Weights   09/19/11 0600 09/20/11 0600 09/21/11 0600  Weight: 286 lb 2.5 oz (129.8 kg) 287 lb 4.2 oz (130.3 kg) 286 lb 6 oz (129.9 kg)    Weight change: -14.1 oz (-0.4 kg)   Hemodynamic parameters for last 24 hours:    Intake/Output from previous day: 04/28 0701 - 04/29 0700 In: 900 [P.O.:900] Out: 1725 [Urine:1725]  Intake/Output this shift:    Current Meds: Scheduled Meds:   . amLODipine  10 mg Oral Daily  . aspirin EC  325 mg Oral Daily  . docusate sodium  200 mg Oral Daily  . enoxaparin  40 mg Subcutaneous Daily  . fenofibrate  160 mg Oral Daily  . gabapentin  300 mg Oral BID  . guaiFENesin  1,200 mg Oral BID  . insulin aspart  0-24 Units Subcutaneous TID AC & HS  . insulin glargine  20 Units Subcutaneous Daily  . metoprolol tartrate  25 mg Oral BID  . mulitivitamin with minerals  1 tablet Oral Daily  . pantoprazole  40 mg Oral QAC breakfast  . ramipril  10 mg Oral BID  . rosuvastatin  10 mg Oral q1800  . sodium chloride  3 mL Intravenous Q12H  . thiamine  100 mg Oral Daily  . DISCONTD: moving right along book   Does  not apply Once   Continuous Infusions:  PRN Meds:.sodium chloride, benzonatate, bisacodyl, bisacodyl, hydrALAZINE, menthol-cetylpyridinium, ondansetron (ZOFRAN) IV, ondansetron, phenol, sodium chloride, temazepam, traMADol  General appearance: alert, cooperative and no distress Neurologic: intact Heart: regular rate and rhythm, S1, S2 normal, no murmur, click, rub or gallop and normal apical impulse Lungs: clear to auscultation bilaterally and normal percussion bilaterally Abdomen: soft, non-tender; bowel sounds normal; no masses,  no organomegaly Extremities: bruising at rt leg vein harvest site, staples intact Wound: sternum stable  Lab Results: CBC: Basename 09/21/11 0332 09/20/11 0412  WBC 11.3* 10.4  HGB 9.9* 9.4*  HCT 30.9* 30.4*  PLT 333 333   BMET:  Basename 09/21/11 0332 09/20/11 0412  NA 136 137  K 3.7 4.0  CL 93* 94*  CO2 37* 35*  GLUCOSE 141* 150*  BUN 22 27*  CREATININE 0.83 0.88  CALCIUM 8.9 8.9    PT/INR: No results found for this basename: LABPROT,INR in the last 72 hours Radiology: Dg Chest Port 1 View  09/21/2011  *RADIOLOGY REPORT*  Clinical Data: Post CABG  PORTABLE CHEST - 1 VIEW  Comparison: Portable chest x-ray of 09/17/2011  Findings: The lungs are better aerated.  Cardiomegaly is stable. There may be mild atelectasis at the lung bases.  Median sternotomy sutures again are noted.  IMPRESSION: Improved aeration.  Only minimal basilar atelectasis remains.  Original Report Authenticated By: Joretta Bachelor, M.D.     Assessment/Plan: S/P Procedure(s) (LRB): ENDARTERECTOMY CAROTID (Right) CORONARY ARTERY BYPASS GRAFTING (CABG) (N/A) AORTA -INNOMIATE BYPASS () Mobilize Diuresis Diabetes control Plan for transfer to step-down: see transfer orders See progression orders     [name] MD  Beeper [phone] Office [phone] 09/21/2011 8:08 AM

## 2011-09-21 NOTE — Progress Notes (Signed)
UR Completed.  [name] [id number] 09/21/2011

## 2011-09-21 NOTE — Progress Notes (Signed)
ANTICOAGULATION CONSULT NOTE - Initial Consult  Pharmacy Consult for Heparin Indication: DVT (LUE)  Allergies  Allergen Reactions  . Augmentin Nausea Only    Patient Measurements: Height: 5' 10.87" (180 cm) (on 09/07/11) Weight: 286 lb 6 oz (129.9 kg) IBW/kg (Calculated) : 74.99  Heparin Dosing Weight: 105 kg  Vital Signs: Temp: 97.9 F (36.6 C) (04/29 0747) Temp src: Oral (04/29 0747) BP: 135/34 mmHg (04/29 1000) Pulse Rate: 68  (04/29 1000)  Labs:  Basename 09/21/11 0332 09/20/11 0412  HGB 9.9* 9.4*  HCT 30.9* 30.4*  PLT 333 333  APTT -- --  LABPROT -- --  INR -- --  HEPARINUNFRC -- --  CREATININE 0.83 0.88  CKTOTAL -- --  CKMB -- --  TROPONINI -- --   Estimated Creatinine Clearance: 110.4 ml/min (by C-G formula based on Cr of 0.83).  Medical History: Past Medical History  Diagnosis Date  . Chest pain   . Muscle pain   . Diarrhea   . Hyperlipidemia     takes Tricor and Lipitor daily  . Hyperlipidemia   . Atherosclerotic heart disease   . PVD (peripheral vascular disease)   . Peripheral neuropathy   . Seasonal allergies     takes Mucinex and Zyrtec prn  . Nasal polyps     hx of  . Edema     right leg knee down swollen since fall 3 wks ago  . Back pain   . GERD (gastroesophageal reflux disease)     Rolaids as needed-occ reflux  . Colon polyps   . Renal insufficiency   . Urinary frequency     urgency/takes Vesicare daily  . Kidney stone     35+yrs ago  . Pneumonia     as a child  . Hypertension     takes Altace,Amlodipine,and Nadolol daily  . Peripheral edema     wears knee length hose-told by podiatrist to wear these  . Peripheral neuropathy     takes Gabapentin daily  . Arthritis   . Bruises easily     takes Pletal daily and ASA 325mg  daily  . Hemorrhoids   . Cyst     on perineum;takes Doxycycline daily  . History of kidney stones     at age 71   . Diabetes mellitus     takes Glipizide,Metformin,and Actos daily  . Cataract immature      right    Medications:  Scheduled:    . amLODipine  10 mg Oral Daily  . aspirin EC  325 mg Oral Daily  . docusate sodium  200 mg Oral Daily  . enoxaparin  40 mg Subcutaneous Daily  . fenofibrate  160 mg Oral Daily  . gabapentin  300 mg Oral BID  . guaiFENesin  1,200 mg Oral BID  . insulin aspart  0-24 Units Subcutaneous TID AC & HS  . insulin glargine  20 Units Subcutaneous Daily  . metoprolol tartrate  25 mg Oral BID  . moving right along book   Does not apply Once  . pantoprazole  40 mg Oral QAC breakfast  . ramipril  10 mg Oral BID  . rosuvastatin  10 mg Oral q1800  . sodium chloride  3 mL Intravenous Q12H  . thiamine  100 mg Oral Daily  . DISCONTD: insulin aspart  0-24 Units Subcutaneous TID AC & HS  . DISCONTD: metoprolol tartrate  25 mg Oral BID  . DISCONTD: moving right along book   Does not apply Once  .  DISCONTD: mulitivitamin with minerals  1 tablet Oral Daily  . DISCONTD: pantoprazole  40 mg Oral QAC breakfast  . DISCONTD: sodium chloride  3 mL Intravenous Q12H    **Patient received Lovenox 40mg  SQ ~ 1045 this AM  Assessment: 71 yo M s/p CABG and R CEA on 4/18 with continued LUE extremity swelling.  Doppler revealed DVT in the left internal jugular vein, subclavian vein and axillary vein in the left upper extremity.  To begin heparin infusion for VTE treatment.  **Note: Patient received Lovenox 40mg  SQ ~ 1045 this AM.  Goal of Therapy:  Heparin level 0.3-0.7 units/ml   Plan:  D/C Lovenox. Begin heparin infusion at 1400 units/hr (no bolus 2/2 recent Lovenox administration) Heparin level in 8 hours.  Heparin level and CBC daily while on heparin.  [employer], Pharm.D., BCPS Clinical Pharmacist Pager [phone] 09/21/2011 11:50 AM

## 2011-09-21 NOTE — Progress Notes (Signed)
Patient transferred from Unit 2300 to 2000 after report was given to receiving RN.  Patient's chart, medications, and personal belongings all went with patient.  Doran Clay, RN

## 2011-09-21 NOTE — Progress Notes (Addendum)
OT Cancellation Note  Treatment cancelled today due to medical issues with patient which prohibited therapy: pt + for DVT in left internal jugular vein, subclavian vein and axillary vein in the left upper extremity. Pt already on Lovenox- RN unsure at this time if different anticoagulant will be started. Will hold at this time pending decision. **MD** please advise on desired activity level. Thank you. Darci Current, OTR/L Pager: [phone] 09/21/2011   [name] 09/21/2011, 10:02 AM

## 2011-09-21 NOTE — Progress Notes (Signed)
VASCULAR LAB PRELIMINARY  PRELIMINARY  PRELIMINARY  PRELIMINARY  Left upper extremity venous Doppler completed.    Preliminary report:  There is DVT noted in the left internal jugular vein, subclavian vein and axillary vein in the left upper extremity.  Sharion Dove [place], 09/21/2011, 9:55 AM

## 2011-09-22 ENCOUNTER — Encounter: Payer: Self-pay | Admitting: Surgery

## 2011-09-22 DIAGNOSIS — I2510 Atherosclerotic heart disease of native coronary artery without angina pectoris: Secondary | ICD-10-CM

## 2011-09-22 DIAGNOSIS — R5381 Other malaise: Secondary | ICD-10-CM

## 2011-09-22 DIAGNOSIS — G92 Toxic encephalopathy: Secondary | ICD-10-CM

## 2011-09-22 LAB — CBC
HCT: 30.9 % — ABNORMAL LOW (ref 39.0–52.0)
Hemoglobin: 9.5 g/dL — ABNORMAL LOW (ref 13.0–17.0)
MCH: 28.2 pg (ref 26.0–34.0)
MCHC: 30.7 g/dL (ref 30.0–36.0)
MCV: 91.7 fL (ref 78.0–100.0)
Platelets: 364 10*3/uL (ref 150–400)
RBC: 3.37 MIL/uL — ABNORMAL LOW (ref 4.22–5.81)
RDW: 16.5 % — ABNORMAL HIGH (ref 11.5–15.5)
WBC: 14 10*3/uL — ABNORMAL HIGH (ref 4.0–10.5)

## 2011-09-22 LAB — BASIC METABOLIC PANEL
BUN: 17 mg/dL (ref 6–23)
CO2: 32 mEq/L (ref 19–32)
Calcium: 8.6 mg/dL (ref 8.4–10.5)
Chloride: 96 mEq/L (ref 96–112)
Creatinine, Ser: 0.71 mg/dL (ref 0.50–1.35)
GFR calc Af Amer: >90 mL/min (ref >90)
GFR calc non Af Amer: >90 mL/min (ref >90)
Glucose, Bld: 173 mg/dL — ABNORMAL HIGH (ref 70–99)
Potassium: 3.9 mEq/L (ref 3.5–5.1)
Sodium: 137 mEq/L (ref 135–145)

## 2011-09-22 LAB — HEPARIN LEVEL (UNFRACTIONATED): Heparin Unfractionated: 0.55 IU/mL (ref 0.30–0.70)

## 2011-09-22 LAB — GLUCOSE, CAPILLARY
Glucose-Capillary: 163 mg/dL — ABNORMAL HIGH (ref 70–99)
Glucose-Capillary: 159 mg/dL — ABNORMAL HIGH (ref 70–99)
Glucose-Capillary: 164 mg/dL — ABNORMAL HIGH (ref 70–99)

## 2011-09-22 MED ORDER — WARFARIN VIDEO
Freq: Once | Status: AC
  Administered 2011-09-22: 11:00:00

## 2011-09-22 MED ORDER — LABETALOL HCL 5 MG/ML IV SOLN
20.0000 mg | INTRAVENOUS | Status: DC | PRN
  Administered 2011-09-22 – 2011-09-27 (×5): 20 mg via INTRAVENOUS
  Filled 2011-09-22 (×3): qty 4

## 2011-09-22 MED ORDER — WARFARIN SODIUM 2.5 MG PO TABS
2.5000 mg | ORAL_TABLET | Freq: Every day | ORAL | Status: DC
  Administered 2011-09-22 – 2011-09-23 (×2): 2.5 mg via ORAL
  Filled 2011-09-22 (×3): qty 1

## 2011-09-22 MED ORDER — WARFARIN - PHYSICIAN DOSING INPATIENT: Freq: Every day | Status: DC

## 2011-09-22 NOTE — Consult Note (Signed)
Physical Medicine and Rehabilitation Consult Reason for Consult: deconditioning  Referring Phsyician:  Dr. Cyndia Bent   HPI: [name] is an 71 y.o. male with history of ASPVD with R-CAS and CAD,  admitted  04/17 for R-CEA by Dr. Oneida Alar and CABG X 2 by Dr. Cyndia Bent. Extubated without difficulty on 4/19.  Transfused with 2 U PRBC and diuresed for fluid overload. Did develop confusion and agitation with hypoxia on 04/22. Continues with confusion who poor safety. Family reported history of ICU psychosis with last admission. Dopplers done due to Left arm edema on 4/29 and revealed DVT left IJ, subclavian and axillary vein. Started on IV heparin for treatment. Started on keflex for RLE cellulits today. MD, PT recommending CIR.   Review of Systems  Unable to perform ROS: mental acuity  Musculoskeletal: Positive for falls (frequent falls since last fall- less since L-CEA.).   Past Medical History  Diagnosis Date  . Chest pain   . Muscle pain   . Diarrhea   . Hyperlipidemia     takes Tricor and Lipitor daily  . Hyperlipidemia   . Atherosclerotic heart disease   . PVD (peripheral vascular disease)   . Peripheral neuropathy   . Seasonal allergies     takes Mucinex and Zyrtec prn  . Nasal polyps     hx of  . Edema     right leg knee down swollen since fall 3 wks ago  . Back pain   . GERD (gastroesophageal reflux disease)     Rolaids as needed-occ reflux  . Colon polyps   . Renal insufficiency   . Urinary frequency     urgency/takes Vesicare daily  . Kidney stone     35+yrs ago  . Pneumonia     as a child  . Hypertension     takes Altace,Amlodipine,and Nadolol daily  . Peripheral edema     wears knee length hose-told by podiatrist to wear these  . Peripheral neuropathy     takes Gabapentin daily  . Arthritis   . Bruises easily     takes Pletal daily and ASA 325mg  daily  . Hemorrhoids   . Cyst     on perineum;takes Doxycycline daily  . History of kidney stones     at age 45     . Diabetes mellitus     takes Glipizide,Metformin,and Actos daily  . Cataract immature     right   Past Surgical History  Procedure Date  . Aortobifemoral bypass   . Reimplantation of inferior mesenteric artery   . Repair of infrarenal abdominal aortic aneurysm   . Shoulder arthroscopy w/ rotator cuff repair     right and left  . Tonsillectomy     at age 17  . Knee arthroscopy 2011  . Cataract surgery     left  . Wisdom teeth extracted     as a teenager  . Carotid endarterectomy 04/08/2011    left CEA  . Endarterectomy 04/08/2011    Procedure: ENDARTERECTOMY CAROTID;  Surgeon: Elam Dutch, MD;  Location: [facility] OR;  Service: Vascular;  Laterality: Left;  with patch angioplasty  . Cardiac catheterization most recent 05/2011    total of 4  . Colonoscopy   . Coronary artery bypass graft 09/09/2011    Procedure: CORONARY ARTERY BYPASS GRAFTING (CABG);  Surgeon: Gaye Pollack, MD;  Location: [facility];  Service: Open Heart Surgery;  Laterality: N/A;  Coronary artery bypass grafting  x three with Right saphenous vein  harvested endoscopically and left internal mammary artery  . Endarterectomy 09/09/2011    Procedure: ENDARTERECTOMY CAROTID;  Surgeon: Elam Dutch, MD;  Location: [facility] OR;  Service: Vascular;  Laterality: Right;  with patch angioplasty   Family History  Problem Relation Age of Onset  . Heart disease Mother   . Heart disease Father   . Anesthesia problems Neg Hx   . Hypotension Neg Hx   . Malignant hyperthermia Neg Hx   . Pseudochol deficiency Neg Hx    Social History: Married. He was working out of home-sporadically per wife. Per reports that he quit smoking about 5 years ago. His smoking use included Cigarettes. He quit after 40 years of use. He does not have any smokeless tobacco history on file. Wife reports glass of wine with supper occasionally.  He reports that he does not use illicit drugs.   Allergies  Allergen Reactions  . Amoxicillin-Pot Clavulanate Nausea  Only   Medications Prior to Admission  Medication Sig Dispense Refill  . amLODipine (NORVASC) 5 MG tablet Take 5 mg by mouth daily.      Marland Kitchen aspirin 325 MG EC tablet Take 325 mg by mouth daily.      Marland Kitchen atorvastatin (LIPITOR) 20 MG tablet Take 20 mg by mouth daily.      Marland Kitchen b complex vitamins capsule Take 1 capsule by mouth daily.        . Cetirizine HCl (ZYRTEC ALLERGY PO) Take 1 tablet by mouth daily as needed. For allergies      . cilostazol (PLETAL) 100 MG tablet Take 100 mg by mouth 2 (two) times daily.       Marland Kitchen doxycycline (VIBRA-TABS) 100 MG tablet Take 100 mg by mouth daily.       . fenofibrate (TRICOR) 145 MG tablet Take 145 mg by mouth daily.      Marland Kitchen gabapentin (NEURONTIN) 300 MG capsule Take 300 mg by mouth 2 (two) times daily.       Marland Kitchen glipiZIDE (GLUCOTROL) 10 MG tablet Take 10 mg by mouth daily.       . Loperamide HCl (IMODIUM PO) Take 1 tablet by mouth daily as needed. For diarrhea      . metFORMIN (GLUCOPHAGE) 500 MG tablet Take 500 mg by mouth 2 (two) times daily with a meal.       . nadolol (CORGARD) 80 MG tablet Take 80 mg by mouth daily.       . nitroGLYCERIN (NITROSTAT) 0.4 MG SL tablet Place 0.4 mg under the tongue every 5 (five) minutes as needed. For chest pain       . pioglitazone (ACTOS) 30 MG tablet Take 15 mg by mouth daily.       . pseudoephedrine-guaifenesin (MUCINEX D) 60-600 MG per tablet Take 1 tablet by mouth as needed. Congestion      . ramipril (ALTACE) 10 MG tablet Take 10 mg by mouth 2 (two) times daily.       . temazepam (RESTORIL) 15 MG capsule Take 15 mg by mouth at bedtime as needed. For sleep        Home: Home Living Lives With: Spouse Available Help at Discharge: Family;Available 24 hours/day (wife works 3 days a week) Type of Home: House Home Access: Stairs to enter [employer] of Steps: 12 Entrance Stairs-Rails: Can reach both Home Layout: Two level Bathroom Shower/Tub: Multimedia programmer: Handicapped height Home Adaptive  Equipment: Straight cane;Walker - rolling Additional Comments: independent with cane but was having some  falls.    Functional History: Prior Function Able to Take Stairs?: Yes Driving: Yes Vocation: Full time employment Functional Status:  Mobility: Bed Mobility Bed Mobility: Not assessed Sit to Supine: Not Tested (comment) Sit to Supine: Patient Percentage: 40% Sit to Sidelying Left: Not Tested (comment) Sit to Sidelying Left: Patient Percentage: 40% Scooting to HOB: Not tested (comment) Scooting to [facility]: Patient Percentage: 10% Transfers Transfers: Sit to Stand;Stand to Sit Sit to Stand: 4: Min assist;From chair/3-in-1 Sit to Stand: Patient Percentage: 50% Stand to Sit: 4: Min assist;To chair/3-in-1 Stand to Sit: Patient Percentage: 50% Stand Pivot Transfers: Not tested (comment) Stand Pivot Transfers: Patient Percentage: 70% Ambulation/Gait Ambulation/Gait Assistance: 1: +2 Total assist (2 people for chair, lines, safety) Ambulation/Gait: Patient Percentage: 80 Ambulation Distance (Feet): 300 Feet Assistive device: Rolling walker Ambulation/Gait Assistance Details: Assist to turn and direct RW as well as cueing for stepping into RW and extended trunk  Gait Pattern: Step-through pattern;Decreased stride length Stairs: No Wheelchair Mobility Wheelchair Mobility: No  ADL:    Cognition: Cognition Arousal/Alertness: Awake/alert Orientation Level: Oriented to person;Oriented to place;Oriented to time;Disoriented to situation Cognition Overall Cognitive Status: Impaired Area of Impairment: Memory;Safety/judgement;Following commands;Problem solving;Awareness of errors Arousal/Alertness: Awake/alert Orientation Level: Disoriented X4;Situation;Time Behavior During Session: [facility] for tasks performed Current Attention Level: Sustained Attention - Other Comments: for up to a minute Memory: Decreased recall of precautions Memory Deficits: continues with poor recall of sternal  precautions.  Constant need to cue patient for simple tasks and needs repetition constantly for precautions Following Commands: Follows one step commands consistently Safety/Judgement: Decreased safety judgement for tasks assessed;Decreased awareness of need for assistance Awareness of Deficits: Poor safety awareness  Blood pressure 147/55, pulse 74, temperature 99.8 F (37.7 C), temperature source Oral, resp. rate 20, height 5' 10.87" (1.8 m), weight 131.4 kg (289 lb 11 oz), SpO2 92.00%. Physical Exam  Nursing note and vitals reviewed. Constitutional: He appears well-developed and well-nourished.       Sitter in room.  HENT:  Head: Normocephalic and atraumatic.  Eyes: Pupils are equal, round, and reactive to light.  Neck: Normal range of motion. Neck supple.  Cardiovascular: Normal rate and regular rhythm.   Pulmonary/Chest: Effort normal. No respiratory distress. He has decreased breath sounds in the right lower field and the left lower field. He has no wheezes.  Abdominal: Soft. Bowel sounds are normal. He exhibits no distension.  Musculoskeletal: He exhibits edema (LUE 2+. RLE with edema, ecchymosis and erythema at incision sites.).  Neurological:       Oriented to self and place.  Confused and attempting to get out of bed.  Confabulatory, confused speech with perseveration about a meeting at [place] basic commands without difficulty. Needs occasional redirection. Did not wake up on followup evaluation although did stir once when cued tactilely.  Skin:       Chest and right neck incisions are clean and intact.    Results for orders placed during the hospital encounter of 09/09/11 (from the past 24 hour(s))  GLUCOSE, CAPILLARY     Status: Abnormal   Collection Time   09/21/11 12:12 PM      Component Value Range   Glucose-Capillary 146 (*) 70 - 99 (mg/dL)  GLUCOSE, CAPILLARY     Status: Abnormal   Collection Time   09/21/11  4:28 PM      Component Value Range    Glucose-Capillary 146 (*) 70 - 99 (mg/dL)  GLUCOSE, CAPILLARY     Status: Abnormal  Collection Time   09/21/11  8:50 PM      Component Value Range   Glucose-Capillary 178 (*) 70 - 99 (mg/dL)  HEPARIN LEVEL (UNFRACTIONATED)     Status: Normal   Collection Time   09/21/11  9:03 PM      Component Value Range   Heparin Unfractionated 0.60  0.30 - 0.70 (IU/mL)  GLUCOSE, CAPILLARY     Status: Abnormal   Collection Time   09/22/11  5:51 AM      Component Value Range   Glucose-Capillary 163 (*) 70 - 99 (mg/dL)  CBC     Status: Abnormal   Collection Time   09/22/11  7:30 AM      Component Value Range   WBC 14.0 (*) 4.0 - 10.5 (K/uL)   RBC 3.37 (*) 4.22 - 5.81 (MIL/uL)   Hemoglobin 9.5 (*) 13.0 - 17.0 (g/dL)   HCT 30.9 (*) 39.0 - 52.0 (%)   MCV 91.7  78.0 - 100.0 (fL)   MCH 28.2  26.0 - 34.0 (pg)   MCHC 30.7  30.0 - 36.0 (g/dL)   RDW 16.5 (*) 11.5 - 15.5 (%)   Platelets 364  150 - 400 (K/uL)  BASIC METABOLIC PANEL     Status: Abnormal   Collection Time   09/22/11  7:30 AM      Component Value Range   Sodium 137  135 - 145 (mEq/L)   Potassium 3.9  3.5 - 5.1 (mEq/L)   Chloride 96  96 - 112 (mEq/L)   CO2 32  19 - 32 (mEq/L)   Glucose, Bld 173 (*) 70 - 99 (mg/dL)   BUN 17  6 - 23 (mg/dL)   Creatinine, Ser 0.71  0.50 - 1.35 (mg/dL)   Calcium 8.6  8.4 - 10.5 (mg/dL)   GFR calc non Af Amer >90  >90 (mL/min)   GFR calc Af Amer >90  >90 (mL/min)  HEPARIN LEVEL (UNFRACTIONATED)     Status: Normal   Collection Time   09/22/11  7:30 AM      Component Value Range   Heparin Unfractionated 0.55  0.30 - 0.70 (IU/mL)  GLUCOSE, CAPILLARY     Status: Abnormal   Collection Time   09/22/11 11:16 AM      Component Value Range   Glucose-Capillary 193 (*) 70 - 99 (mg/dL)   Dg Chest Port 1 View  09/21/2011  *RADIOLOGY REPORT*  Clinical Data: Post CABG  PORTABLE CHEST - 1 VIEW  Comparison: Portable chest x-ray of 09/17/2011  Findings: The lungs are better aerated.  Cardiomegaly is stable. There may be  mild atelectasis at the lung bases.  Median sternotomy sutures again are noted.  IMPRESSION: Improved aeration.  Only minimal basilar atelectasis remains.  Original Report Authenticated By: Joretta Bachelor, M.D.    Assessment/Plan: Diagnosis: Weakness and encephalopathy after CABG and CEA 1. Does the need for close, 24 hr/day medical supervision in concert with the patient's rehab needs make it unreasonable for this patient to be served in a less intensive setting? Yes 2. Co-Morbidities requiring supervision/potential complications: Diabetes, pain management 3. Due to bladder management, bowel management, safety, skin/wound care, disease management, medication administration, pain management and patient education, does the patient require 24 hr/day rehab nursing? Yes 4. Does the patient require coordinated care of a physician, rehab nurse, PT (1-2 hrs/day, 5 days/week), OT (1-2 hrs/day, 5 days/week) and SLP (1-2 hrs/day, 5 days/week) to address physical and functional deficits in the context of the above medical  diagnosis(es)? Yes Addressing deficits in the following areas: balance, endurance, locomotion, strength, transferring, bowel/bladder control, bathing, dressing, feeding, grooming, toileting, cognition, speech, language and psychosocial support 5. Can the patient actively participate in an intensive therapy program of at least 3 hrs of therapy per day at least 5 days per week? Yes and Potentially 6. The potential for patient to make measurable gains while on inpatient rehab is good 7. Anticipated functional outcomes upon discharge from inpatient rehab are supervision with PT, supervision to minimal assistance with OT, vision a minimal assistance with SLP. 8. Estimated rehab length of stay to reach the above functional goals is: 1 week to 2 weeks depending on cognitive recovery 9. Does the patient have adequate social supports to accommodate these discharge functional goals? Yes 10. Anticipated  D/C setting: Home 11. Anticipated post D/C treatments: [facility] therapy 12. Overall Rehab/Functional Prognosis: excellent  RECOMMENDATIONS: This patient's condition is appropriate for continued rehabilitative care in the following setting: CIR Patient has agreed to participate in recommended program. Potentially Note that insurance prior authorization may be required for reimbursement for recommended care.  Comment: Patient was fairly independent prior to arrival. Cognition is far from baseline. Would do well on inpatient rehabilitation setting. Has good social supports. Rehabilitation nurse will followup.   Meredith Staggers M.D. 09/22/2011

## 2011-09-22 NOTE — Progress Notes (Signed)
   CARE MANAGEMENT NOTE 09/22/2011  Patient:  [name], [name]   Account Number:  [id number]  Date Initiated:  09/14/2011  Documentation initiated by:  Shaylyn Bawa  Subjective/Objective Assessment:   PT S/P CABG X3 ON 09/09/11.  PTA, PT INDEPENDENT, LIVES WITH SPOUSE.     Action/Plan:   WIFE AND CHILDREN TO PROVIDE 24 HR CARE AT DISCHARGE.  WILL FOLLOW FOR HOME NEEDS AS PT PROGRESSES.   Anticipated DC Date:  09/17/2011   Anticipated DC Plan:  [place]  CM consult      Choice offered to / List presented to:             Status of service:  In process, will continue to follow Medicare Important Message given?   (If response is "NO", the following Medicare IM given date fields will be blank) Date Medicare IM given:   Date Additional Medicare IM given:    Discharge Disposition:    Per UR Regulation:    If discussed at Long Length of Stay Meetings, dates discussed:    Comments:  09/22/11 Zanai Mallari,RN,BSN 1200 PT IMPROVING, NOW ON ACUTE UNIT.  CONFUSION MUCH IMPROVED. PT AND OT RE-ORDERED.  REHAB MD TO SEE FOR POSSIBLE INPATIENT REHAB ADMISSIONS.  WILL FOLLOW. Phone [phone]   09/18/11 Xoe Hoe,RN,BSN PT STILL WITH POST OP CONFUSION, IMPROVING.  PT RECOMMENDING IP REHAB.  WILL OBTAIN OT CONSULT.  WILL FOLLOW...MAY NEED SNF IF NOT A CANDIDATE FOR CIR.  WILL FOLLOW.

## 2011-09-22 NOTE — Progress Notes (Signed)
ANTICOAGULATION CONSULT NOTE - Follow Up Consult  Pharmacy Consult for Heparin Indication: DVT (LUE)  Assessment: 71 yo male s/p CABG and R CEA on 4/18 found to have a LUE DVT and heparin therapy was initiated today. Heparin level is therapeutic. No bleeding reported.   Goal of Therapy:  Heparin level 0.3-0.7 units/ml   Plan:  1. Continue heparin at 1400 units/hr 2. Confirmatory heparin level with am labs 3. Daily heparin level and CBC   Allergies  Allergen Reactions  . Amoxicillin-Pot Clavulanate Nausea Only    Patient Measurements: Height: 5' 10.87" (180 cm) (on 09/07/11) Weight: 289 lb 11 oz (131.4 kg) IBW/kg (Calculated) : 74.99  Heparin Dosing Weight: 105 kg  Vital Signs: BP: 147/55 mmHg (04/30 0542) Pulse Rate: 74  (04/30 0542)  Labs:  Basename 09/22/11 0730 09/21/11 2103 09/21/11 0332 09/20/11 0412  HGB 9.5* -- 9.9* --  HCT 30.9* -- 30.9* 30.4*  PLT 364 -- 333 333  APTT -- -- -- --  LABPROT -- -- -- --  INR -- -- -- --  HEPARINUNFRC 0.55 0.60 -- --  CREATININE 0.71 -- 0.83 0.88  CKTOTAL -- -- -- --  CKMB -- -- -- --  TROPONINI -- -- -- --   Estimated Creatinine Clearance: 115.2 ml/min (by C-G formula based on Cr of 0.71).   Medications:  Infusions:     . heparin 1,400 Units/hr (09/21/11 1500)    Tad Moore 09/22/2011,8:51 AM

## 2011-09-22 NOTE — Progress Notes (Signed)
Physical Therapy Treatment Patient Details Name: [name] MRN: [id number] DOB: [birth year] Today's Date: 09/22/2011 Time: 1441-1510 PT Time Calculation (min): 29 min  PT Assessment / Plan / Recommendation Comments on Treatment Session  Pt s/p CABG, aorta bypass and CEA with AMS that continues to limit safe progression of activity. Pt unable to recall specific surgery or precautions. Pt was able to briefly follow commands to perform bil LE exercises of quad activation x 5 before attention and awarness impeded any further HEP. Wife present and educated for precautions.     Follow Up Recommendations       Equipment Recommendations       Frequency     Plan Discharge plan remains appropriate    Precautions / Restrictions Precautions Precautions: Sternal;Fall   Pertinent Vitals/Pain None reported    Mobility  Bed Mobility Bed Mobility: Rolling Left;Left Sidelying to Sit;Sitting - Scoot to Edge of Bed Rolling Left: 3: Mod assist Left Sidelying to Sit: 3: Mod assist;HOB flat Sitting - Scoot to Edge of Bed: 3: Mod assist Details for Bed Mobility Assistance: cueing and assist for sequence, precautions and transfers  Transfers Transfers: Sit to Stand;Stand to Sit Sit to Stand: 4: Min assist;From bed Stand to Sit: 4: Min assist;To chair/3-in-1 Details for Transfer Assistance: cueing for hand placement on thighs, positioning at surface before sitting and control of descent Ambulation/Gait Ambulation/Gait Assistance: 1: +2 Total assist Ambulation/Gait: Patient Percentage: 80 Ambulation Distance (Feet): 300 Feet Assistive device: Rolling walker Ambulation/Gait Assistance Details: cueing for direction, VC to step into RW and extend trunk, VC to look up Gait Pattern: Step-through pattern;Decreased stride length Stairs: No    Exercises General Exercises - Lower Extremity Short Arc Quad: AROM;Left;5 reps;Seated Long Arc Quad: AROM;Right;5 reps;Seated   PT Goals Acute Rehab PT  Goals PT Goal: Rolling Supine to Left Side - Progress: Progressing toward goal Pt will go Supine/Side to Sit: with min assist PT Goal: Supine/Side to Sit - Progress: Revised due to lack of progress PT Goal: Sit to Supine/Side - Progress: Progressing toward goal PT Goal: Sit to Stand - Progress: Progressing toward goal PT Goal: Stand to Sit - Progress: Progressing toward goal PT Goal: Ambulate - Progress: Progressing toward goal PT Goal: Perform Home Exercise Program - Progress: Progressing toward goal  Visit Information  Last PT Received On: 09/22/11 Assistance Needed: +2    Subjective Data  Subjective: get the books from my [place] they will roll better   Cognition  Overall Cognitive Status: Impaired Area of Impairment: Memory;Attention;Safety/judgement;Following commands;Problem solving;Awareness of errors;Awareness of deficits Arousal/Alertness: Awake/alert Orientation Level: Disoriented to;Time;Situation (pt aware of surgery but couldn't accurately state) Behavior During Session: [facility] for tasks performed Current Attention Level: Sustained Memory: Decreased recall of precautions Memory Deficits: pt unable to recall any precautions Following Commands: Follows one step commands inconsistently Safety/Judgement: Decreased awareness of safety precautions;Decreased awareness of need for assistance    Balance     End of Session PT - End of Session Equipment Utilized During Treatment: Gait belt Activity Tolerance: Patient tolerated treatment well Patient left: in chair;with call bell/phone within reach;with family/visitor present;Other (comment) (with sitter) Nurse Communication: Mobility status    Melford Aase 09/22/2011, 4:06 PM

## 2011-09-22 NOTE — Progress Notes (Signed)
[facility] [place]  Subjective  - Still confused, thinks he is at [facility], remembers carotid but not heart operation  Objective 147/55 74 99.8 F (37.7 C) (Oral) 20 92%  Intake/Output Summary (Last 24 hours) at 09/22/11 0927 Last data filed at 09/22/11 0700  Gross per 24 hour  Intake    235 ml  Output    640 ml  Net   -405 ml   Neuro-follows commands motor 5/5 Persistant left arm edema  Assessment/Planning: DVT left arm continue heparin, will need oral anticoagulation for 6 months. Ok to start this at any point from my standpoint but will defer to cardiac surgery service  RCEA and innominate bypass incisions cont to heal  Delirium no significant change over last 24 hrs  [name] E 09/22/2011 9:27 AM --  Laboratory Lab Results:  Basename 09/22/11 0730 09/21/11 0332  WBC 14.0* 11.3*  HGB 9.5* 9.9*  HCT 30.9* 30.9*  PLT 364 333   BMET  Basename 09/22/11 0730 09/21/11 0332  NA 137 136  K 3.9 3.7  CL 96 93*  CO2 32 37*  GLUCOSE 173* 141*  BUN 17 22  CREATININE 0.71 0.83  CALCIUM 8.6 8.9    COAG Lab Results  Component Value Date   INR 1.48 09/09/2011   INR 1.63* 09/09/2011   INR 1.00 09/07/2011   No results found for this basename: PTT    Antibiotics Anti-infectives     Start     Dose/Rate Route Frequency Ordered Stop   09/10/11 0230   vancomycin (VANCOCIN) IVPB 1000 mg/200 mL premix        1,000 mg 200 mL/hr over 60 Minutes Intravenous  Once 09/09/11 2014 09/10/11 0343   09/09/11 2200   cefUROXime (ZINACEF) 1.5 g in dextrose 5 % 50 mL IVPB        1.5 g 100 mL/hr over 30 Minutes Intravenous Every 12 hours 09/09/11 2014 09/11/11 1136   09/09/11 0400   vancomycin (VANCOCIN) 1,500 mg in sodium chloride 0.9 % 250 mL IVPB        1,500 mg 125 mL/hr over 120 Minutes Intravenous To Surgery 09/08/11 1411 09/09/11 0930   09/09/11 0400   cefUROXime (ZINACEF) 1.5 g in dextrose 5 % 50 mL IVPB        1.5 g 100 mL/hr over  30 Minutes Intravenous To Surgery 09/08/11 1411 09/09/11 1730   09/09/11 0400   cefUROXime (ZINACEF) 750 mg in dextrose 5 % 50 mL IVPB  Status:  Discontinued        750 mg 100 mL/hr over 30 Minutes Intravenous To Surgery 09/08/11 1411 09/09/11 1748

## 2011-09-22 NOTE — Progress Notes (Signed)
Pt ambulated 150 ft with 2 assist and front wheel walker on 2L of O2.  Pt tolerated well.  Pt was easily distracted and needed to be refocused multiple times. Returned pt to chair.  Bedside sitter and family at bedside.  Denies needs at this time.  Will continue to monitor.

## 2011-09-22 NOTE — Progress Notes (Addendum)
13 Days Post-Op Procedure(s) (LRB): ENDARTERECTOMY CAROTID (Right) CORONARY ARTERY BYPASS GRAFTING (CABG) (N/A) AORTA -INNOMIATE BYPASS () Subjective:  [name] states he is feeling better this morning.  He is febrile per morning vitlas  Objective: Vital signs in last 24 hours: Temp:  [98.3 F (36.8 C)-99.8 F (37.7 C)] 99.8 F (37.7 C) (04/29 2045) Pulse Rate:  [67-98] 74  (04/30 0542) Cardiac Rhythm:  [-] Normal sinus rhythm (04/29 2015) Resp:  [15-27] 20  (04/30 0406) BP: (96-202)/(34-90) 147/55 mmHg (04/30 0542) SpO2:  [92 %-99 %] 92 % (04/30 0406) Weight:  [289 lb 11 oz (131.4 kg)] 289 lb 11 oz (131.4 kg) (04/30 0406)  Intake/Output from previous day: 04/29 0701 - 04/30 0700 In: 275 [P.O.:240; I.V.:35] Out: 290 [Urine:290]  General appearance: alert, cooperative and no distress Heart: regular rate and rhythm Lungs: clear to auscultation bilaterally Abdomen: soft, non-tender; bowel sounds normal; no masses,  no organomegaly Extremities: edema 1+ RLE, trace on Left Wound: RLE incisions appear cellulitic, skin taut and warm, no evidence of purulent drainage  Lab Results:  Basename 09/21/11 0332 09/20/11 0412  WBC 11.3* 10.4  HGB 9.9* 9.4*  HCT 30.9* 30.4*  PLT 333 333   BMET:  Basename 09/21/11 0332 09/20/11 0412  NA 136 137  K 3.7 4.0  CL 93* 94*  CO2 37* 35*  GLUCOSE 141* 150*  BUN 22 27*  CREATININE 0.83 0.88  CALCIUM 8.9 8.9    PT/INR: No results found for this basename: LABPROT,INR in the last 72 hours ABG    Component Value Date/Time   PHART 7.414 09/11/2011 1231   HCO3 23.9 09/11/2011 1231   TCO2 25 09/11/2011 1231   ACIDBASEDEF 1.0 09/11/2011 1231   O2SAT 91.0 09/11/2011 1231   CBG (last 3)   Basename 09/22/11 0551 09/21/11 2050 09/21/11 1628  GLUCAP 163* 178* 146*    Assessment/Plan: S/P Procedure(s) (LRB): ENDARTERECTOMY CAROTID (Right) CORONARY ARTERY BYPASS GRAFTING (CABG) (N/A) AORTA -INNOMIATE BYPASS ()  2. CV- NSR rate in the  70s, mildly hypertensive, on Metoprolol 25mg  BID, will follow may need to increase dosage 3. LUE DVT- currently on Heparin, will start Coumadin when appropriate with staff 4. ? RLE cellulitis- patient is febrile this morning, exam RLE is erythematous with taut skin, warm to touch, will start Keflex 500mg  QID, staples in incisions will remove every other one 5. DM- CBGs controlled with current insulin regimen    LOS: 13 days    BARRETT, ERIN 09/22/2011   Start coumadin, on heparin Daily pro time I have seen and examined [name] and agree with the above assessment  and plan.  [name] MD [facility] [phone] Office [phone] 09/22/2011 11:00 AM

## 2011-09-22 NOTE — Progress Notes (Addendum)
I met with patient and his wife at bedside. Patient would benefit from an inpt rehab stay, but [facility] will not approve inpt rehab at this time. They recommend SNF. I received a phone call from his daughter, and she is requesting an appeal to the insurance decision. I will initiate this per her request. Abby Steelhammer is also requesting Dr. Oneida Alar and Dr. Cyndia Bent to contact her to discuss her concerns with his recovery and his d/c disposition. Her cell is [phone]. I will discuss with RN CM. Please call [phone] with questions. Daughter would like to discuss a neurology consult related to his confusion.

## 2011-09-23 DIAGNOSIS — G9340 Encephalopathy, unspecified: Secondary | ICD-10-CM

## 2011-09-23 DIAGNOSIS — R4182 Altered mental status, unspecified: Secondary | ICD-10-CM

## 2011-09-23 LAB — CBC
HCT: 32.2 % — ABNORMAL LOW (ref 39.0–52.0)
RBC: 3.49 MIL/uL — ABNORMAL LOW (ref 4.22–5.81)
RDW: 16.5 % — ABNORMAL HIGH (ref 11.5–15.5)
WBC: 11.4 10*3/uL — ABNORMAL HIGH (ref 4.0–10.5)

## 2011-09-23 LAB — URINALYSIS, MICROSCOPIC ONLY
Bilirubin Urine: NEGATIVE
Hgb urine dipstick: NEGATIVE
Nitrite: NEGATIVE
Protein, ur: 100 mg/dL — AB
Specific Gravity, Urine: 1.017 (ref 1.005–1.030)
Urobilinogen, UA: 4 mg/dL — ABNORMAL HIGH (ref 0.0–1.0)

## 2011-09-23 LAB — HEPARIN LEVEL (UNFRACTIONATED): Heparin Unfractionated: 0.62 IU/mL (ref 0.30–0.70)

## 2011-09-23 LAB — GLUCOSE, CAPILLARY: Glucose-Capillary: 122 mg/dL — ABNORMAL HIGH (ref 70–99)

## 2011-09-23 LAB — PROTIME-INR: INR: 1.13 (ref 0.00–1.49)

## 2011-09-23 MED ORDER — CEPHALEXIN 500 MG PO CAPS
500.0000 mg | ORAL_CAPSULE | Freq: Three times a day (TID) | ORAL | Status: DC
  Administered 2011-09-23 – 2011-09-26 (×10): 500 mg via ORAL
  Filled 2011-09-23 (×13): qty 1

## 2011-09-23 MED ORDER — HYDROCHLOROTHIAZIDE 25 MG PO TABS
25.0000 mg | ORAL_TABLET | Freq: Every day | ORAL | Status: DC
  Administered 2011-09-23 – 2011-09-29 (×7): 25 mg via ORAL
  Filled 2011-09-23 (×7): qty 1

## 2011-09-23 MED ORDER — INSULIN GLARGINE 100 UNIT/ML ~~LOC~~ SOLN
30.0000 [IU] | Freq: Every day | SUBCUTANEOUS | Status: DC
  Administered 2011-09-23: 30 [IU] via SUBCUTANEOUS

## 2011-09-23 MED ORDER — WARFARIN VIDEO
Freq: Once | Status: AC
  Administered 2011-09-23: 14:00:00

## 2011-09-23 MED ORDER — PATIENT'S GUIDE TO USING COUMADIN BOOK
Freq: Once | Status: AC
  Administered 2011-09-23: 14:00:00
  Filled 2011-09-23: qty 1

## 2011-09-23 NOTE — Progress Notes (Signed)
CARDIAC REHAB PHASE I   PRE:  Rate/Rhythm: 73SR  BP:  Supine:   Sitting: 109/52  Standing:    SaO2: 97%3L  MODE:  Ambulation: 350 ft   POST:  Rate/Rhythem: 83Sr  BP:  Supine:   Sitting: 124/46  Standing:    SaO2: 95%2L 0842-917 Pt walked 350 ft on 2L with rolling walker and asst x 2.  Sitter followed with chair. Pt sat down once to rest. Needed verbal cues. Encouraged pt to not walk so close to wall on left side.  Encouraged  pt not to put pressure on arms when walking. Needs asst x 2 due to unsteadiness. Tired by end of walk but cooperative. Encouraged IS. Left on 2L.  Jeani Sow

## 2011-09-23 NOTE — Progress Notes (Addendum)
VASCULAR AND VEIN SURGERY POST - OP CEA PROGRESS NOTE  Date of Surgery: 09/09/2011 Surgeon: Surgeon(s): Gaye Pollack, MD Elam Dutch, MD 14 Days Post-Op right Carotid Endarterectomy .  HPI: [name] is a 71 y.o. male who is 14 Days Post-Op right Carotid Endarterectomy . Patient is doing well. Pre-operative symptoms are Improved Patient denies headache; Patient denies difficulty swallowing; denies weakness in upper or lower extremities; Pt. denies other symptoms of stroke or TIA.  IMAGING: No results found.  Significant Diagnostic Studies: CBC Lab Results  Component Value Date   WBC 11.4* 09/23/2011   HGB 10.0* 09/23/2011   HCT 32.2* 09/23/2011   MCV 92.3 09/23/2011   PLT 303 09/23/2011    BMET    Component Value Date/Time   NA 137 09/22/2011 0730   K 3.9 09/22/2011 0730   CL 96 09/22/2011 0730   CO2 32 09/22/2011 0730   GLUCOSE 173* 09/22/2011 0730   BUN 17 09/22/2011 0730   CREATININE 0.71 09/22/2011 0730   CALCIUM 8.6 09/22/2011 0730   GFRNONAA >90 09/22/2011 0730   GFRAA >90 09/22/2011 0730    COAG Lab Results  Component Value Date   INR 1.13 09/23/2011   INR 1.48 09/09/2011   INR 1.63* 09/09/2011   No results found for this basename: PTT      Intake/Output Summary (Last 24 hours) at 09/23/11 0801 Last data filed at 09/23/11 0429  Gross per 24 hour  Intake    300 ml  Output   1675 ml  Net  -1375 ml    Physical Exam:  BP Readings from Last 3 Encounters:  09/23/11 138/53  09/23/11 138/53  09/07/11 162/59   Temp Readings from Last 3 Encounters:  09/23/11 98.7 F (37.1 C) Oral  09/23/11 98.7 F (37.1 C) Oral  09/07/11 97.7 F (36.5 C) Oral   SpO2 Readings from Last 3 Encounters:  09/23/11 93%  09/23/11 93%  09/07/11 96%   Pulse Readings from Last 3 Encounters:  09/23/11 72  09/23/11 72  09/07/11 57    Pt is A&O improved mental status. Gait is normal with stand by assistance Speech is normal right Neck Wound is healing well Patient with  Negative tongue deviation and Negative facial droop Pt has good and equal strength in all extremities  Assessment: [name] is a 71 y.o. male is S/P Right Carotid endarterectomy Pt is voiding, ambulating and taking po well   Plan: Discharge to: Home depending on mental status.   Laurence Slate [facility] [id number] 09/23/2011 8:01 AM  Details as above.  Still confused.  Knows he is at [facility] today but needed prompting on circumstances Incisions continue to heal Neuro follows commands 5/5 motor UE/LE Mild erythema right leg, inicision clean no drainage  Daughter requesting Neuro consult at this time since delirium has not cleared at this point.  She was reassured that this should continue to improve.  Spoke with Dr Hazeline Junker Neuro who will see pt today.  Will stress importance to staff to continue PT OT ambulation with nurses and incentive spirometry  DVT- heparin coumadin  Now on Keflex for leg cellulitis from vein harvest site Ruta Hinds, MD [facility] [place]: [phone] Pager: [phone]

## 2011-09-23 NOTE — Evaluation (Signed)
Occupational Therapy Evaluation Patient Details Name: [name] MRN: [id number] DOB: 1940-08-06 Today's Date: 09/23/2011 Time: [id number] OT Time Calculation (min): 38 min  OT Assessment / Plan / Recommendation Clinical Impression  Pt s/p CEA and CABG and presents with below problem list compounded by cognitive deficits related to ?encephalopathy? Pt will benefit from skilled OT in the acute setting followed by inpatient rehab to maximize I with ADL and ADL mobility prior to d/c home with family    OT Assessment  Patient needs continued OT Services    Follow Up Recommendations  Inpatient Rehab    Equipment Recommendations  Defer to next venue    Frequency Min 2X/week    Precautions / Restrictions Precautions Precautions: Sternal;Fall Restrictions Weight Bearing Restrictions: No   Pertinent Vitals/Pain Pt c/o of low back pain standing at toilet for hygiene. Stated this resolved after ambulating    ADL  Eating/Feeding: Not assessed Grooming: Performed;Moderate assistance Where Assessed - Grooming: Standing at sink Upper Body Bathing: Simulated;Moderate assistance Where Assessed - Upper Body Bathing: Sitting, bed Lower Body Bathing: Simulated;Maximal assistance Where Assessed - Lower Body Bathing: Standing at sink Upper Body Dressing: Simulated;Moderate assistance Where Assessed - Upper Body Dressing: Sitting, bed Lower Body Dressing: Simulated;Maximal assistance Where Assessed - Lower Body Dressing: Sit to stand from bed Toilet Transfer: Performed;Minimal assistance Toilet Transfer Method: Ambulating Toilet Transfer Equipment: Regular height toilet;Grab bars Toileting - Clothing Manipulation: Performed;+1 Total assistance (pt not aware of need to get gown out of way before sitting) Where Assessed - Toileting Clothing Manipulation: Standing Toileting - Hygiene: Performed;+1 Total assistance Where Assessed - Toileting Hygiene: Standing Tub/Shower Transfer: Not  assessed Ambulation Related to ADLs: Min A with RW ambulation. Pt required VC and physical assist to stay within confines of RW and to navigate/manipulate RW in tight spaces (in bathroom). Pt easily frustrated telling therapist, "well you just do it!"  ADL Comments: Pt responds better to one-step SIMPLE instructions. He gets easily frustrated when more steps are introduced or he is asked to dual task    OT Goals Acute Rehab OT Goals OT Goal Formulation: With patient Time For Goal Achievement: 10/07/11 Potential to Achieve Goals: Good ADL Goals Pt Will Perform Grooming: with supervision;Standing at sink ADL Goal: Grooming - Progress: Goal set today Pt Will Perform Upper Body Dressing: with set-up;with supervision;Sitting, bed;Sitting, chair ADL Goal: Upper Body Dressing - Progress: Goal set today Pt Will Perform Lower Body Dressing: with mod assist;Sit to stand from chair;Sit to stand from bed ADL Goal: Lower Body Dressing - Progress: Goal set today Pt Will Transfer to Toilet: with supervision;Ambulation;with DME;3-in-1 ADL Goal: Toilet Transfer - Progress: Goal set today Pt Will Perform Toileting - Clothing Manipulation: with supervision;Standing ADL Goal: Toileting - Clothing Manipulation - Progress: Goal set today Pt Will Perform Toileting - Hygiene: with min assist;Sitting on 3-in-1 or toilet;Standing at 3-in-1/toilet ADL Goal: Toileting - Hygiene - Progress: Goal set today Additional ADL Goal #1: Pt will verbalize/generalize all sternal precautions with Min VC's ADL Goal: Additional Goal #1 - Progress: Goal set today  Visit Information  Last OT Received On: 09/23/11 Assistance Needed: +2    Subjective Data  Subjective: You just do it! (referring to navigating RW in bathroom) Patient Stated Goal: Return home   Prior [place] Lives With: Spouse Available Help at Discharge: Family;Available 24 hours/day Type of Home: House Home Access: Stairs to enter [employer] of Steps: 12 Entrance Stairs-Rails: Can reach both Home Layout: Two level Bathroom  Shower/Tub: Multimedia programmer: Handicapped height Home Adaptive Equipment: Straight cane;Walker - rolling Additional Comments: independent with cane but was having some falls.   Prior Function Level of Independence: Independent with assistive device(s) Able to Take Stairs?: Yes Driving: Yes Vocation: Full time employment Communication Communication: No difficulties Dominant Hand: Right    Cognition  Arousal/Alertness: Awake/alert Orientation Level: Disoriented to;Time;Situation Behavior During Session: Agitated Current Attention Level: Sustained Attention - Other Comments: for up to a minute Memory: Decreased recall of precautions Memory Deficits: pt unable to recall any precautions Following Commands: Follows one step commands inconsistently Safety/Judgement: Decreased awareness of safety precautions;Decreased awareness of need for assistance Awareness of Deficits: Poor safety awareness Cognition - Other Comments: Pt responds better to one-step SIMPLE instructions. He gets easily frustrated when more steps are introduced or he is asked to dual task    Extremity/Trunk Assessment Right Upper Extremity Assessment RUE ROM/Strength/Tone: [facility] for tasks assessed (dec endurance) RUE Sensation: WFL - Light Touch RUE Coordination: WFL - gross motor Left Upper Extremity Assessment LUE ROM/Strength/Tone: WFL for tasks assessed (dec endurance) LUE Sensation: WFL - Light Touch LUE Coordination: WFL - gross motor   Mobility Bed Mobility Sit to Supine: 3: Mod assist;HOB flat;With rail Details for Bed Mobility Assistance: cueing for sequence. assist to bring bilateral LE into bed Transfers Sit to Stand: From chair/3-in-1;From toilet;4: Min assist;With upper extremity assist Stand to Sit: 4: Min assist;To toilet;To bed Details for Transfer Assistance: cueing for hand placement on  thighs and for control of descent           End of Session OT - End of Session Equipment Utilized During Treatment: Gait belt Activity Tolerance: Patient limited by fatigue (easily distracted and frustrated) Patient left: in bed;with call bell/phone within reach;with family/visitor present Nurse Communication: Mobility status   Kamil Mchaffie 09/23/2011, 1:37 PM

## 2011-09-23 NOTE — Progress Notes (Signed)
Pt walked 150 ft with 2 assist on 2L of O2.  Took several standing rest breaks.  Pt tolerated well.  Returned pt to chair.  Family at bedside.  Will continue to monitor.

## 2011-09-23 NOTE — Progress Notes (Signed)
   CARE MANAGEMENT NOTE 09/23/2011  Patient:  [name], [name]   Account Number:  [id number]  Date Initiated:  09/14/2011  Documentation initiated by:  Jonica Bickhart  Subjective/Objective Assessment:   PT S/P CABG X3 ON 09/09/11.  PTA, PT INDEPENDENT, LIVES WITH SPOUSE.     Action/Plan:   WIFE AND CHILDREN TO PROVIDE 24 HR CARE AT DISCHARGE.  WILL FOLLOW FOR HOME NEEDS AS PT PROGRESSES.   Anticipated DC Date:  09/17/2011   Anticipated DC Plan:  [place]  CM consult      Choice offered to / List presented to:             Status of service:  In process, will continue to follow Medicare Important Message given?   (If response is "NO", the following Medicare IM given date fields will be blank) Date Medicare IM given:   Date Additional Medicare IM given:    Discharge Disposition:    Per UR Regulation:    If discussed at Long Length of Stay Meetings, dates discussed:    Comments:  09/23/11 [place] DID NOT APPROVE PT FOR INPT REHAB STAY. DTR/WIFE INSISTANT ON APPEAL.  NEURO CONSULT PENDING.  WILL FOLLOW FOR RESULTS OF CONSULTS AND INSURANCE APPEAL. Phone [phone]   09/22/11 Maxmillian Carsey,RN,BSN 1200 PT IMPROVING, NOW ON ACUTE UNIT.  CONFUSION MUCH IMPROVED. PT AND OT RE-ORDERED.  REHAB MD TO SEE FOR POSSIBLE INPATIENT REHAB ADMISSIONS.  WILL FOLLOW.  09/18/11 Dyneshia Baccam,RN,BSN PT STILL WITH POST OP CONFUSION, IMPROVING.  PT RECOMMENDING IP REHAB.  WILL OBTAIN OT CONSULT.  WILL FOLLOW...MAY NEED SNF IF NOT A CANDIDATE FOR CIR.  WILL FOLLOW.

## 2011-09-23 NOTE — Progress Notes (Addendum)
I have initiated appeal process with insurance provider. Await neurology consult as well. I will follow up later today. [id number] I contacted pt' s wife and she is aware of plan of care.

## 2011-09-23 NOTE — Consult Note (Signed)
[facility] CONSULT [facility]     Reason for Consult: TIA    HPI:    [name] is an 71 y.o. male who is 14 days  Post-Op right Carotid Endarterectomy and CABG. Developed confusion and agitation with hypoxia on 4/22.  On 09/21/11 patient showed left internal jugular vein, subclavian vein and axillary vein in the left upper extremity. Patient was started on heparin to bridge coumadin 09/21/11.  On last admission patient was noted to have ICU psychosis.  During this admission patient has remained confused and neurology was asked to evaluate.  OF note: patients daughter does state they have noted some possible memory problems prior to hospitalization but patient has not been formally diagnosed with any neurodegenerative disease.      Past Medical History  Diagnosis Date  . Chest pain   . Muscle pain   . Diarrhea   . Hyperlipidemia     takes Tricor and Lipitor daily  . Hyperlipidemia   . Atherosclerotic heart disease   . PVD (peripheral vascular disease)   . Peripheral neuropathy   . Seasonal allergies     takes Mucinex and Zyrtec prn  . Nasal polyps     hx of  . Edema     right leg knee down swollen since fall 3 wks ago  . Back pain   . GERD (gastroesophageal reflux disease)     Rolaids as needed-occ reflux  . Colon polyps   . Renal insufficiency   . Urinary frequency     urgency/takes Vesicare daily  . Kidney stone     35+yrs ago  . Pneumonia     as a child  . Hypertension     takes Altace,Amlodipine,and Nadolol daily  . Peripheral edema     wears knee length hose-told by podiatrist to wear these  . Peripheral neuropathy     takes Gabapentin daily  . Arthritis   . Bruises easily     takes Pletal daily and ASA 325mg  daily  . Hemorrhoids   . Cyst     on perineum;takes Doxycycline daily  . History of kidney stones     at age 47   . Diabetes mellitus     takes Glipizide,Metformin,and Actos daily  . Cataract immature     right     Past Surgical History  Procedure Date  . Aortobifemoral bypass   . Reimplantation of inferior mesenteric artery   . Repair of infrarenal abdominal aortic aneurysm   . Shoulder arthroscopy w/ rotator cuff repair     right and left  . Tonsillectomy     at age 38  . Knee arthroscopy 2011  . Cataract surgery     left  . Wisdom teeth extracted     as a teenager  . Carotid endarterectomy 04/08/2011    left CEA  . Endarterectomy 04/08/2011    Procedure: ENDARTERECTOMY CAROTID;  Surgeon: Elam Dutch, MD;  Location: [facility] OR;  Service: Vascular;  Laterality: Left;  with patch angioplasty  . Cardiac catheterization most recent 05/2011    total of 4  . Colonoscopy   . Coronary artery bypass graft 09/09/2011    Procedure: CORONARY ARTERY BYPASS GRAFTING (CABG);  Surgeon: Gaye Pollack, MD;  Location: [facility];  Service: Open Heart Surgery;  Laterality: N/A;  Coronary artery bypass grafting  x three with Right saphenous  vein harvested endoscopically and left internal mammary artery  . Endarterectomy 09/09/2011    Procedure: ENDARTERECTOMY CAROTID;  Surgeon: Elam Dutch, MD;  Location: [facility] OR;  Service: Vascular;  Laterality: Right;  with patch angioplasty    Family History  Problem Relation Age of Onset  . Heart disease Mother   . Heart disease Father   . Anesthesia problems Neg Hx   . Hypotension Neg Hx   . Malignant hyperthermia Neg Hx   . Pseudochol deficiency Neg Hx     Social History:  reports that he quit smoking about 5 years ago. His smoking use included Cigarettes. He quit after 40 years of use. He does not have any smokeless tobacco history on file. He reports that he drinks alcohol. He reports that he does not use illicit drugs.  Allergies  Allergen Reactions  . Amoxicillin-Pot Clavulanate Nausea Only    Medications:    Prior to Admission:  Prescriptions prior to admission  Medication Sig Dispense Refill  . amLODipine (NORVASC) 5 MG tablet Take 5 mg by mouth  daily.      Marland Kitchen aspirin 325 MG EC tablet Take 325 mg by mouth daily.      Marland Kitchen atorvastatin (LIPITOR) 20 MG tablet Take 20 mg by mouth daily.      Marland Kitchen b complex vitamins capsule Take 1 capsule by mouth daily.        . Cetirizine HCl (ZYRTEC ALLERGY PO) Take 1 tablet by mouth daily as needed. For allergies      . cilostazol (PLETAL) 100 MG tablet Take 100 mg by mouth 2 (two) times daily.       Marland Kitchen doxycycline (VIBRA-TABS) 100 MG tablet Take 100 mg by mouth daily.       . fenofibrate (TRICOR) 145 MG tablet Take 145 mg by mouth daily.      Marland Kitchen gabapentin (NEURONTIN) 300 MG capsule Take 300 mg by mouth 2 (two) times daily.       Marland Kitchen glipiZIDE (GLUCOTROL) 10 MG tablet Take 10 mg by mouth daily.       . Loperamide HCl (IMODIUM PO) Take 1 tablet by mouth daily as needed. For diarrhea      . metFORMIN (GLUCOPHAGE) 500 MG tablet Take 500 mg by mouth 2 (two) times daily with a meal.       . nadolol (CORGARD) 80 MG tablet Take 80 mg by mouth daily.       . nitroGLYCERIN (NITROSTAT) 0.4 MG SL tablet Place 0.4 mg under the tongue every 5 (five) minutes as needed. For chest pain       . pioglitazone (ACTOS) 30 MG tablet Take 15 mg by mouth daily.       . pseudoephedrine-guaifenesin (MUCINEX D) 60-600 MG per tablet Take 1 tablet by mouth as needed. Congestion      . ramipril (ALTACE) 10 MG tablet Take 10 mg by mouth 2 (two) times daily.       . temazepam (RESTORIL) 15 MG capsule Take 15 mg by mouth at bedtime as needed. For sleep       Scheduled:   . amLODipine  10 mg Oral Daily  . aspirin EC  325 mg Oral Daily  . cephALEXin  500 mg Oral Q8H  . docusate sodium  200 mg Oral Daily  . fenofibrate  160 mg Oral Daily  . gabapentin  300 mg Oral BID  . guaiFENesin  1,200 mg Oral BID  . hydrochlorothiazide  25 mg Oral Daily  .  insulin aspart  0-24 Units Subcutaneous TID AC & HS  . insulin glargine  30 Units Subcutaneous Daily  . metoprolol tartrate  25 mg Oral BID  . pantoprazole  40 mg Oral QAC breakfast  . patient's  guide to using coumadin book   Does not apply Once  . ramipril  10 mg Oral BID  . rosuvastatin  10 mg Oral q1800  . sodium chloride  3 mL Intravenous Q12H  . thiamine  100 mg Oral Daily  . warfarin  2.5 mg Oral q1800  . warfarin   Does not apply Once  . Warfarin - Physician Dosing Inpatient   Does not apply q1800  . DISCONTD: insulin glargine  20 Units Subcutaneous Daily    Review of Systems - General ROS: negative for - chills, fatigue, fever or hot flashes Hematological and Lymphatic ROS: negative for - bruising, fatigue, jaundice or pallor Endocrine ROS: negative for - hair pattern changes, hot flashes, mood swings or skin changes Respiratory ROS: negative for - cough, hemoptysis, orthopnea or wheezing Cardiovascular ROS: negative for - dyspnea on exertion, orthopnea, palpitations or shortness of breath Gastrointestinal ROS: negative for - abdominal pain, appetite loss, blood in stools, diarrhea or hematemesis Musculoskeletal ROS: negative for - joint pain, joint stiffness, joint swelling or muscle pain Neurological ROS: positive for - confusion Dermatological ROS: negative for dry skin, pruritus and rash   Blood pressure 138/53, pulse 72, temperature 98.7 F (37.1 C), temperature source Oral, resp. rate 22, height 5' 10.87" (1.8 m), weight 127.6 kg (281 lb 4.9 oz), SpO2 93.00%.   Neurologic Examination:  Mental Status: Alert, oriented, to [facility] believes with is 3015, able to tell me he had a CABG but could not think of other surgery he had.  He showed short attention span and often would not make coherent sentences.  His speech was fluent, able to name objects and repeat words given to him. thought content disorganized.   Able to follow 3 step commands without difficulty. Cranial Nerves: II-Visual fields grossly intact. III/IV/VI-Extraocular movements intact.  Pupils reactive bilaterally. V/VII-Smile symmetric VIII-grossly intact IX/X-normal gag XI-bilateral shoulder  shrug XII-midline tongue extension Motor: 5/5 bilaterally UE and 4/5 bilaterally LE.  No drift noted on UE or LE. Normal tone and bulk. No tremor Sensory: Pinprick and light touch intact throughout, bilaterally however when testing DSS in LE he often would neglect his left leg.  Deep Tendon Reflexes: 1+ and symmetric throughout Plantars: Downgoing bilaterally Cerebellar: Normal finger-to-nose   No results found for this basename: cbc, bmp, coags, chol, tri, ldl, hga1c    Results for orders placed during the hospital encounter of 09/09/11 (from the past 48 hour(s))  GLUCOSE, CAPILLARY     Status: Abnormal   Collection Time   09/21/11  4:28 PM      Component Value Range Comment   Glucose-Capillary 146 (*) 70 - 99 (mg/dL)   GLUCOSE, CAPILLARY     Status: Abnormal   Collection Time   09/21/11  8:50 PM      Component Value Range Comment   Glucose-Capillary 178 (*) 70 - 99 (mg/dL)   HEPARIN LEVEL (UNFRACTIONATED)     Status: Normal   Collection Time   09/21/11  9:03 PM      Component Value Range Comment   Heparin Unfractionated 0.60  0.30 - 0.70 (IU/mL)   GLUCOSE, CAPILLARY     Status: Abnormal   Collection Time   09/22/11  5:51 AM      Component  Value Range Comment   Glucose-Capillary 163 (*) 70 - 99 (mg/dL)   CBC     Status: Abnormal   Collection Time   09/22/11  7:30 AM      Component Value Range Comment   WBC 14.0 (*) 4.0 - 10.5 (K/uL)    RBC 3.37 (*) 4.22 - 5.81 (MIL/uL)    Hemoglobin 9.5 (*) 13.0 - 17.0 (g/dL)    HCT 30.9 (*) 39.0 - 52.0 (%)    MCV 91.7  78.0 - 100.0 (fL)    MCH 28.2  26.0 - 34.0 (pg)    MCHC 30.7  30.0 - 36.0 (g/dL)    RDW 16.5 (*) 11.5 - 15.5 (%)    Platelets 364  150 - 400 (K/uL)   BASIC METABOLIC PANEL     Status: Abnormal   Collection Time   09/22/11  7:30 AM      Component Value Range Comment   Sodium 137  135 - 145 (mEq/L)    Potassium 3.9  3.5 - 5.1 (mEq/L)    Chloride 96  96 - 112 (mEq/L)    CO2 32  19 - 32 (mEq/L)    Glucose, Bld 173 (*) 70 -  99 (mg/dL)    BUN 17  6 - 23 (mg/dL)    Creatinine, Ser 0.71  0.50 - 1.35 (mg/dL)    Calcium 8.6  8.4 - 10.5 (mg/dL)    GFR calc non Af Amer >90  >90 (mL/min)    GFR calc Af Amer >90  >90 (mL/min)   HEPARIN LEVEL (UNFRACTIONATED)     Status: Normal   Collection Time   09/22/11  7:30 AM      Component Value Range Comment   Heparin Unfractionated 0.55  0.30 - 0.70 (IU/mL)   GLUCOSE, CAPILLARY     Status: Abnormal   Collection Time   09/22/11 11:16 AM      Component Value Range Comment   Glucose-Capillary 193 (*) 70 - 99 (mg/dL)   GLUCOSE, CAPILLARY     Status: Abnormal   Collection Time   09/22/11  5:00 PM      Component Value Range Comment   Glucose-Capillary 159 (*) 70 - 99 (mg/dL)   GLUCOSE, CAPILLARY     Status: Abnormal   Collection Time   09/22/11  9:08 PM      Component Value Range Comment   Glucose-Capillary 164 (*) 70 - 99 (mg/dL)   HEPARIN LEVEL (UNFRACTIONATED)     Status: Normal   Collection Time   09/23/11  5:20 AM      Component Value Range Comment   Heparin Unfractionated 0.62  0.30 - 0.70 (IU/mL)   CBC     Status: Abnormal   Collection Time   09/23/11  5:20 AM      Component Value Range Comment   WBC 11.4 (*) 4.0 - 10.5 (K/uL)    RBC 3.49 (*) 4.22 - 5.81 (MIL/uL)    Hemoglobin 10.0 (*) 13.0 - 17.0 (g/dL)    HCT 32.2 (*) 39.0 - 52.0 (%)    MCV 92.3  78.0 - 100.0 (fL)    MCH 28.7  26.0 - 34.0 (pg)    MCHC 31.1  30.0 - 36.0 (g/dL)    RDW 16.5 (*) 11.5 - 15.5 (%)    Platelets 303  150 - 400 (K/uL)   PROTIME-INR     Status: Normal   Collection Time   09/23/11  5:20 AM      Component Value  Range Comment   Prothrombin Time 14.7  11.6 - 15.2 (seconds)    INR 1.13  0.00 - 1.49    GLUCOSE, CAPILLARY     Status: Abnormal   Collection Time   09/23/11  5:31 AM      Component Value Range Comment   Glucose-Capillary 122 (*) 70 - 99 (mg/dL)     No results found.   Assessment/Plan:   71 YO male 14 days S/P CABG and Right CEA.  Since admission patient has remained  confused.  Per family patient does have history of ICU psychosis during last admission. CT head on 09/16/11 shows stable noncontrast CT appearance of the brain with chronic periventricular white matter changes.   Left internal jugular vein, subclavian vein and axillary vein DVT on [place] now on coumadin with heparin bridge per pharmacy with INR 1.13 currently.  Given his recent CABG and CEA cannot R/O micro embolic infarcts not seen on CT scan in addition to Delirium V.S underlying neurodegenerative disease worsened by in hospital setting.   Recommend: 1) MRI brain when ok by surgery as patient has staples and sternal wires 2) EEG 3) TSH and UA  I have discussed patient with Dr. Sheran Spine and she has seen the patient and agrees with the above mentioned.   Etta Quill PA-C [facility] [phone]  09/23/2011, 1:00 PM

## 2011-09-23 NOTE — Progress Notes (Addendum)
[address]            [place] [address]          [phone]     14 Days Post-Op  Procedure(s) (LRB): ENDARTERECTOMY CAROTID (Right) CORONARY ARTERY BYPASS GRAFTING (CABG) (N/A) AORTA -INNOMIATE BYPASS () Subjective: Slowly feeling stronger  Objective  Telemetry SR  Temp:  [98.2 F (36.8 C)-99.1 F (37.3 C)] 98.7 F (37.1 C) (05/01 0405) Pulse Rate:  [64-80] 72  (05/01 0723) Resp:  [18-24] 22  (05/01 0723) BP: (138-176)/(47-66) 138/53 mmHg (05/01 0723) SpO2:  [92 %-95 %] 93 % (05/01 0609) Weight:  [281 lb 4.9 oz (127.6 kg)] 281 lb 4.9 oz (127.6 kg) (05/01 0538)   Intake/Output Summary (Last 24 hours) at 09/23/11 0801 Last data filed at 09/23/11 0429  Gross per 24 hour  Intake    300 ml  Output   1675 ml  Net  -1375 ml       General appearance: alert, cooperative and no distress Heart: regular rate and rhythm and S1, S2 normal Lungs: fair air movement- diminished in bases Abdomen: + BS, soft Extremities: RLE edema, erethema Wound: incisions stable with some cellulitis to R LE  Lab Results:  [facility] 09/22/11 0730 09/21/11 0332  NA 137 136  K 3.9 3.7  CL 96 93*  CO2 32 37*  GLUCOSE 173* 141*  BUN 17 22  CREATININE 0.71 0.83  CALCIUM 8.6 8.9  MG -- --  PHOS -- --   No results found for this basename: AST:2,ALT:2,ALKPHOS:2,BILITOT:2,PROT:2,ALBUMIN:2 in the last 72 hours No results found for this basename: LIPASE:2,AMYLASE:2 in the last 72 hours  Basename 09/23/11 0520 09/22/11 0730  WBC 11.4* 14.0*  NEUTROABS -- --  HGB 10.0* 9.5*  HCT 32.2* 30.9*  MCV 92.3 91.7  PLT 303 364   No results found for this basename: CKTOTAL:4,CKMB:4,TROPONINI:4 in the last 72 hours No components found with this basename: POCBNP:3 No results found for this basename: DDIMER in the last 72 hours No results found for this basename: HGBA1C in the last 72 hours No results found for this basename: CHOL,HDL,LDLCALC,TRIG,CHOLHDL in the  last 72 hours No results found for this basename: TSH,T4TOTAL,FREET3,T3FREE,THYROIDAB in the last 72 hours No results found for this basename: VITAMINB12,FOLATE,FERRITIN,TIBC,IRON,RETICCTPCT in the last 72 hours  Medications: Scheduled    . amLODipine  10 mg Oral Daily  . aspirin EC  325 mg Oral Daily  . docusate sodium  200 mg Oral Daily  . fenofibrate  160 mg Oral Daily  . gabapentin  300 mg Oral BID  . guaiFENesin  1,200 mg Oral BID  . insulin aspart  0-24 Units Subcutaneous TID AC & HS  . insulin glargine  20 Units Subcutaneous Daily  . metoprolol tartrate  25 mg Oral BID  . pantoprazole  40 mg Oral QAC breakfast  . ramipril  10 mg Oral BID  . rosuvastatin  10 mg Oral q1800  . sodium chloride  3 mL Intravenous Q12H  . thiamine  100 mg Oral Daily  . warfarin  2.5 mg Oral q1800  . warfarin   Does not apply Once  . Warfarin - Physician Dosing Inpatient   Does not apply q1800     Radiology/Studies:  No results found. - INR:1.13 Will add last result for INR, ABG once components are confirmed Will add last 4 CBG results once components are confirmed  Assessment/Plan: S/P Procedure(s) (LRB): ENDARTERECTOMY CAROTID (Right) CORONARY ARTERY BYPASS GRAFTING (CABG) (N/A) AORTA -INNOMIATE BYPASS ()  1. Cont current ac rx-heparin /coumadin 2 keflex for cellulitis 3 hypertension - on ace/betablocker,ccb. Will add hctz 4 pulm toilet 5 rehab 6 cbg 122 -193, increase lantus, ?restart oral agents   LOS: 14 days    [name],[name] 5/1/20138:01 AM  Still confused Foul smelling urine/ ua/urine culture pending On Keflex for leg cellulitis On coumadin I have seen and examined [name] and agree with the above assessment  and plan.  [name] MD Beeper [phone] Office [phone] 09/23/2011 7:19 PM

## 2011-09-23 NOTE — Progress Notes (Signed)
ANTICOAGULATION CONSULT NOTE - Follow Up Consult  Pharmacy Consult for Heparin Indication: DVT (LUE)  Assessment: 71 yo male s/p CABG and R CEA on 4/18 found to have a LUE DVT and heparin therapy was initiated 4/29. Heparin level of 0.62 is therapeutic on  1400 units/hr.  Coumadin per MD day#2/5 minimum overlap required for VTE. Marland Kitchen No bleeding reported. CBC stable.  Goal of Therapy:  Heparin level 0.3-0.7 units/ml   Plan:  1. Continue heparin at 1400 units/hr 2. Coumadin per MD - on 2.5 mg po daily 3. Daily heparin level and CBC 4. Will order coumadin book and video for caregivers. Eudelia Bunch, Pharm.D. [id number] 09/23/2011 10:33 AM   Allergies  Allergen Reactions  . Amoxicillin-Pot Clavulanate Nausea Only    Patient Measurements: Height: 5' 10.87" (180 cm) (on 09/07/11) Weight: 281 lb 4.9 oz (127.6 kg) IBW/kg (Calculated) : 74.99  Heparin Dosing Weight: 105 kg  Vital Signs: Temp: 98.7 F (37.1 C) (05/01 0405) Temp src: Oral (05/01 0405) BP: 138/53 mmHg (05/01 0723) Pulse Rate: 72  (05/01 0723)  Labs:  Basename 09/23/11 0520 09/22/11 0730 09/21/11 2103 09/21/11 0332  HGB 10.0* 9.5* -- --  HCT 32.2* 30.9* -- 30.9*  PLT 303 364 -- 333  APTT -- -- -- --  LABPROT 14.7 -- -- --  INR 1.13 -- -- --  HEPARINUNFRC 0.62 0.55 0.60 --  CREATININE -- 0.71 -- 0.83  CKTOTAL -- -- -- --  CKMB -- -- -- --  TROPONINI -- -- -- --   Estimated Creatinine Clearance: 113.3 ml/min (by C-G formula based on Cr of 0.71).   Medications:  Infusions:     . heparin 1,400 Units/hr (09/23/11 0134)

## 2011-09-24 ENCOUNTER — Inpatient Hospital Stay (HOSPITAL_COMMUNITY): Payer: Medicare Other

## 2011-09-24 DIAGNOSIS — G9340 Encephalopathy, unspecified: Secondary | ICD-10-CM

## 2011-09-24 LAB — GLUCOSE, CAPILLARY
Glucose-Capillary: 137 mg/dL — ABNORMAL HIGH (ref 70–99)
Glucose-Capillary: 160 mg/dL — ABNORMAL HIGH (ref 70–99)
Glucose-Capillary: 170 mg/dL — ABNORMAL HIGH (ref 70–99)

## 2011-09-24 LAB — CBC
HCT: 29.9 % — ABNORMAL LOW (ref 39.0–52.0)
Hemoglobin: 9.4 g/dL — ABNORMAL LOW (ref 13.0–17.0)
MCV: 91.2 fL (ref 78.0–100.0)
RBC: 3.28 MIL/uL — ABNORMAL LOW (ref 4.22–5.81)
WBC: 12.7 10*3/uL — ABNORMAL HIGH (ref 4.0–10.5)

## 2011-09-24 LAB — PROTIME-INR: INR: 1.17 (ref 0.00–1.49)

## 2011-09-24 MED ORDER — WARFARIN SODIUM 5 MG PO TABS
5.0000 mg | ORAL_TABLET | Freq: Every day | ORAL | Status: DC
  Administered 2011-09-24: 5 mg via ORAL
  Filled 2011-09-24 (×2): qty 1

## 2011-09-24 MED ORDER — MAGIC MOUTHWASH
5.0000 mL | Freq: Three times a day (TID) | ORAL | Status: DC | PRN
  Administered 2011-09-25 – 2011-09-27 (×3): 5 mL via ORAL
  Filled 2011-09-24 (×3): qty 5

## 2011-09-24 MED ORDER — GLIPIZIDE 10 MG PO TABS
10.0000 mg | ORAL_TABLET | Freq: Every day | ORAL | Status: DC
  Administered 2011-09-25 – 2011-09-29 (×5): 10 mg via ORAL
  Filled 2011-09-24 (×6): qty 1

## 2011-09-24 MED ORDER — METFORMIN HCL 500 MG PO TABS
500.0000 mg | ORAL_TABLET | Freq: Two times a day (BID) | ORAL | Status: DC
  Administered 2011-09-24 – 2011-09-29 (×11): 500 mg via ORAL
  Filled 2011-09-24 (×13): qty 1

## 2011-09-24 NOTE — Progress Notes (Addendum)
ANTICOAGULATION CONSULT NOTE - Follow Up Consult  Pharmacy Consult for Heparin Indication: DVT (LUE)  Assessment: 71 yo male s/p CABG and R CEA on 4/18 found to have a LUE DVT and heparin therapy was initiated 4/29. Heparin level of 0.58 is therapeutic on  1400 units/hr.  Coumadin per MD day#3/5 minimum overlap required for VTE. INS 1.17 after 2 doses of coumadin 2.5 mg per MD.  No bleeding reported. CBC stable.  Goal of Therapy:  Heparin level 0.3-0.7 units/ml   Plan:  1. Continue heparin at 1400 units/hr 2. Coumadin per MD - dose increased to 5 mg po daily 3. Daily heparin level and CBC 4. Coumadin education completed 5/1 with family members Eudelia Bunch, Pharm.D. [id number] 09/24/2011 2:05 PM   Allergies  Allergen Reactions  . Amoxicillin-Pot Clavulanate Nausea Only    Patient Measurements: Height: 5' 10.87" (180 cm) (on 09/07/11) Weight: 282 lb 8 oz (128.141 kg) IBW/kg (Calculated) : 74.99  Heparin Dosing Weight: 105 kg  Vital Signs: Temp: 99.1 F (37.3 C) (05/02 0646) Temp src: Oral (05/02 0646) BP: 156/47 mmHg (05/02 1006) Pulse Rate: 74  (05/02 1006)  Labs:  Basename 09/24/11 0630 09/24/11 0550 09/23/11 0520 09/22/11 0730  HGB -- 9.4* 10.0* --  HCT -- 29.9* 32.2* 30.9*  PLT -- 283 303 364  APTT -- -- -- --  LABPROT 15.1 -- 14.7 --  INR 1.17 -- 1.13 --  HEPARINUNFRC 0.58 -- 0.62 0.55  CREATININE -- -- -- 0.71  CKTOTAL -- -- -- --  CKMB -- -- -- --  TROPONINI -- -- -- --   Estimated Creatinine Clearance: 113.6 ml/min (by C-G formula based on Cr of 0.71).   Medications:  Infusions:     . heparin 1,400 Units/hr (09/24/11 1310)

## 2011-09-24 NOTE — Progress Notes (Signed)
CARDIAC REHAB PHASE I   PRE:  Rate/Rhythm: 69SR  BP:  Supine:   Sitting: 117/50  Standing:    SaO2: 97%3L  MODE:  Ambulation: 350 ft   POST:  Rate/Rhythem: 79SR  BP:  Supine:   Sitting: 135/46  Standing:    SaO2: 97%2L [id number] Pt very sleepy. Walked 350 ft on 2L with rolling walker and asst x 2. Followed with chair. Sat down once when he had to void with foley. Slow to respond to questions but could answer questions correctly. Needs encouragement to mobilize. C/o legs hurting at incisions. Also said he would like for the doctor to look in his mouth because it is sore. Call bell in reach. Helped to set up breakfast.  Jeani Sow

## 2011-09-24 NOTE — Progress Notes (Signed)
Physical Therapy Cancellation Note: pt asleep in chair after having just ambulated with nursing in the hall. Will attempt later if time allows. Thanks Elwyn Reach, [place]

## 2011-09-24 NOTE — Procedures (Signed)
EEG NUMBER:  [phone].  This routine EEG was requested in this 71 year old man who is status post a right CEA.  He has now had delirium, agitation and confusion.  His medications include no anticonvulsants.  The EEG was done with the patient awake and drowsy.   During periods of maximal wakefulness, background activities are composed of mixed frequency theta activities that appeared symmetric, but were moderately disorganized.  There was no clear alpha rhythm.  Photic stimulation produced a symmetric driving response.  Hyperventilation was not performed.   The patient did become drowsy for a short period of time.  This was characterized by the onset of roving eye movements and attenuation of muscle activities.  Also note was noted were decreased in amplitudes of the background activities.  Rare vertex sharp waves were seen.  No stage 2 sleep was reached. EKG revealed normal sinus rhythm.  CLINICAL INTERPRETATION:  This routine EEG done with the patient awake, but confused and drowsy is abnormal.  Background activities in the high- frequency theta range suggest a mild-to-moderate encephalopathy of nonspecific etiology.  No interictal epileptiform discharges, electrographic seizures or nonconvulsive status epilepticus was seen.          ______________________________ Neena Rhymes, MD    [id number] D:  09/24/2011 17:52:55  T:  09/24/2011 18:33:02  Job #:  [id number]

## 2011-09-24 NOTE — Progress Notes (Signed)
Visit with the patient. I had a long conversation with the wife who is concerned about his neurological status

## 2011-09-24 NOTE — Consult Note (Signed)
PM&R F/U NOTE:  Chart reviewed.  It appears that Mr. Corio has progressed quite a bit from a mobility standpoint. He's walking from unit to unit with little assistance.  He does need some help with transfers due to his sternal precautions. Mental status is not back to baseline.  At this point, because of his substantial mobility progress, he does not require the intensity of CIR.  Recommend home with twenty-four hour assistance or short term SNF.  Oval Linsey, MD

## 2011-09-24 NOTE — Progress Notes (Signed)
[facility] PROGRESS NOTE    SUBJECTIVE   No complaints. Believes he is in his office.   OBJECTIVE   Vital signs in last 24 hours: Temp:  [98.6 F (37 C)-99.1 F (37.3 C)] 99.1 F (37.3 C) (05/02 0646) Pulse Rate:  [67-74] 74  (05/02 1006) Resp:  [20] 20  (05/02 0500) BP: (127-159)/(47-98) 156/47 mmHg (05/02 1006) SpO2:  [94 %-95 %] 94 % (05/02 0646) Weight:  [128.141 kg (282 lb 8 oz)] 128.141 kg (282 lb 8 oz) (05/02 0646)  Intake/Output from previous day: 05/01 0701 - 05/02 0700 In: 240 [P.O.:240] Out: 550 [Urine:550] Intake/Output this shift:   Nutritional status: Carb Control  Past Medical History  Diagnosis Date  . Chest pain   . Muscle pain   . Diarrhea   . Hyperlipidemia     takes Tricor and Lipitor daily  . Hyperlipidemia   . Atherosclerotic heart disease   . PVD (peripheral vascular disease)   . Peripheral neuropathy   . Seasonal allergies     takes Mucinex and Zyrtec prn  . Nasal polyps     hx of  . Edema     right leg knee down swollen since fall 3 wks ago  . Back pain   . GERD (gastroesophageal reflux disease)     Rolaids as needed-occ reflux  . Colon polyps   . Renal insufficiency   . Urinary frequency     urgency/takes Vesicare daily  . Kidney stone     35+yrs ago  . Pneumonia     as a child  . Hypertension     takes Altace,Amlodipine,and Nadolol daily  . Peripheral edema     wears knee length hose-told by podiatrist to wear these  . Peripheral neuropathy     takes Gabapentin daily  . Arthritis   . Bruises easily     takes Pletal daily and ASA 325mg  daily  . Hemorrhoids   . Cyst     on perineum;takes Doxycycline daily  . History of kidney stones     at age 75   . Diabetes mellitus     takes Glipizide,Metformin,and Actos daily  . Cataract immature     right    Neurologic Exam:  Mental Status:  Alert, oriented, to [facility] at times but other times during conversation believes  he is in his office. Continues to have short attention span. His speech was fluent, able to name objects and repeat words given to him. thought content disorganized. Able to follow 3 step commands without difficulty.  Cranial Nerves:  II-Visual fields grossly intact.  III/IV/VI-Extraocular movements intact. Pupils reactive bilaterally.  V/VII-Smile symmetric  VIII-grossly intact  IX/X-normal gag  XI-bilateral shoulder shrug  XII-midline tongue extension  Motor: 5/5 bilaterally UE and 4/5 bilaterally LE. No drift noted on UE or LE. Normal tone and bulk. No tremor  Sensory: Pinprick and light touch intact throughout, bilaterally however when testing DSS he continues to be inconsistent Deep Tendon Reflexes: 1+ and symmetric throughout  Plantars: Downgoing bilaterally  Cerebellar: Normal finger-to-nose   Lab Results: Results for orders placed during the hospital encounter of 09/09/11 (from the past 24 hour(s))  URINALYSIS, WITH MICROSCOPIC     Status: Abnormal   Collection Time   09/23/11  1:35 PM  Component Value Range   Color, Urine YELLOW  YELLOW    APPearance CLOUDY (*) CLEAR    Specific Gravity, Urine 1.017  1.005 - 1.030    pH 7.5  5.0 - 8.0    Glucose, UA NEGATIVE  NEGATIVE (mg/dL)   Hgb urine dipstick NEGATIVE  NEGATIVE    Bilirubin Urine NEGATIVE  NEGATIVE    Ketones, ur NEGATIVE  NEGATIVE (mg/dL)   Protein, ur 100 (*) NEGATIVE (mg/dL)   Urobilinogen, UA 4.0 (*) 0.0 - 1.0 (mg/dL)   Nitrite NEGATIVE  NEGATIVE    Leukocytes, UA SMALL (*) NEGATIVE    RBC / HPF 3-6  <3 (RBC/hpf)   Bacteria, UA FEW (*) RARE    Squamous Epithelial / LPF FEW (*) RARE    Urine-Other AMORPHOUS URATES/PHOSPHATES    TSH     Status: Normal   Collection Time   09/23/11  2:00 PM      Component Value Range   TSH 4.149  0.350 - 4.500 (uIU/mL)  GLUCOSE, CAPILLARY     Status: Abnormal   Collection Time   09/23/11  4:33 PM      Component Value Range   Glucose-Capillary 174 (*) 70 - 99 (mg/dL)    GLUCOSE, CAPILLARY     Status: Abnormal   Collection Time   09/23/11  9:21 PM      Component Value Range   Glucose-Capillary 179 (*) 70 - 99 (mg/dL)  HEPARIN LEVEL (UNFRACTIONATED)     Status: Normal   Collection Time   09/24/11  6:30 AM      Component Value Range   Heparin Unfractionated 0.58  0.30 - 0.70 (IU/mL)  PROTIME-INR     Status: Normal   Collection Time   09/24/11  6:30 AM      Component Value Range   Prothrombin Time 15.1  11.6 - 15.2 (seconds)   INR 1.17  0.00 - 1.49   GLUCOSE, CAPILLARY     Status: Abnormal   Collection Time   09/24/11  6:31 AM      Component Value Range   Glucose-Capillary 137 (*) 70 - 99 (mg/dL)   Comment 1 Notify RN    GLUCOSE, CAPILLARY     Status: Abnormal   Collection Time   09/24/11 12:05 PM      Component Value Range   Glucose-Capillary 160 (*) 70 - 99 (mg/dL)   Comment 1 Notify RN     Comment 2 Documented in Chart     Lipid Panel No results found for this basename: CHOL,TRIG,HDL,CHOLHDL,VLDL,LDLCALC in the last 72 hours  Studies/Results: No results found.  Medications:     Scheduled:   . amLODipine  10 mg Oral Daily  . aspirin EC  325 mg Oral Daily  . cephALEXin  500 mg Oral Q8H  . docusate sodium  200 mg Oral Daily  . fenofibrate  160 mg Oral Daily  . gabapentin  300 mg Oral BID  . glipiZIDE  10 mg Oral QAC breakfast  . guaiFENesin  1,200 mg Oral BID  . hydrochlorothiazide  25 mg Oral Daily  . insulin aspart  0-24 Units Subcutaneous TID AC & HS  . metFORMIN  500 mg Oral BID WC  . metoprolol tartrate  25 mg Oral BID  . pantoprazole  40 mg Oral QAC breakfast  . patient's guide to using coumadin book   Does not apply Once  . ramipril  10 mg Oral BID  . rosuvastatin  10 mg Oral q1800  .  sodium chloride  3 mL Intravenous Q12H  . thiamine  100 mg Oral Daily  . warfarin  5 mg Oral q1800  . warfarin   Does not apply Once  . Warfarin - Physician Dosing Inpatient   Does not apply q1800  . DISCONTD: insulin glargine  30 Units  Subcutaneous Daily  . DISCONTD: warfarin  2.5 mg Oral q1800    Assessment/Plan:   71 YO male 14 days S/P CABG and Right CEA. Since admission patient has remained confused. Per family patient does have history of ICU psychosis during last admission. CT head on 09/16/11 shows stable noncontrast CT appearance of the brain with chronic periventricular white matter changes. Left internal jugular vein, subclavian vein and axillary vein DVT on [place] now on coumadin with heparin bridge per pharmacy with INR 1.17 today.   Given his recent CABG and CEA cannot R/O micro embolic infarcts not seen on CT scan in addition to Delirium V.S underlying neurodegenerative disease worsened by in hospital setting.   EEG done and will be read later today TSH and UA normal MRI when surgery feels he is able to have.  Will follow EEG.    Etta Quill PA-C [facility] [phone]  09/24/2011, 1:00 PM

## 2011-09-24 NOTE — Progress Notes (Addendum)
[address]            [place] [address]          [phone]     15 Days Post-Op  Procedure(s) (LRB): ENDARTERECTOMY CAROTID (Right) CORONARY ARTERY BYPASS GRAFTING (CABG) (N/A) AORTA -INNOMIATE BYPASS () Subjective: C/O tongue discomfort, some confusion persists but does seem more lucid  Objective  Telemetry SR  Temp:  [98.6 F (37 C)-99.1 F (37.3 C)] 99.1 F (37.3 C) (05/02 0646) Pulse Rate:  [67-70] 70  (05/02 0646) Resp:  [20] 20  (05/02 0500) BP: (127-159)/(65-98) 159/67 mmHg (05/02 0500) SpO2:  [94 %-95 %] 94 % (05/02 0646) Weight:  [282 lb 8 oz (128.141 kg)] 282 lb 8 oz (128.141 kg) (05/02 0646)   Intake/Output Summary (Last 24 hours) at 09/24/11 0830 Last data filed at 09/24/11 0500  Gross per 24 hour  Intake    120 ml  Output    550 ml  Net   -430 ml       General appearance: alert, cooperative and no distress Heart: regular rate and rhythm and S1, S2 normal Lungs: fair air exchange throughout Abdomen: soft, non-tender; bowel sounds normal; no masses,  no organomegaly Extremities: + edema Wound: incisions healing well, erethema improved RLE  Lab Results:  Basename 09/22/11 0730  NA 137  K 3.9  CL 96  CO2 32  GLUCOSE 173*  BUN 17  CREATININE 0.71  CALCIUM 8.6  MG --  PHOS --   No results found for this basename: AST:2,ALT:2,ALKPHOS:2,BILITOT:2,PROT:2,ALBUMIN:2 in the last 72 hours No results found for this basename: LIPASE:2,AMYLASE:2 in the last 72 hours  Basename 09/24/11 0550 09/23/11 0520  WBC 12.7* 11.4*  NEUTROABS -- --  HGB 9.4* 10.0*  HCT 29.9* 32.2*  MCV 91.2 92.3  PLT 283 303   No results found for this basename: CKTOTAL:4,CKMB:4,TROPONINI:4 in the last 72 hours No components found with this basename: POCBNP:3 No results found for this basename: DDIMER in the last 72 hours No results found for this basename: HGBA1C in the last 72 hours No results found for this basename:  CHOL,HDL,LDLCALC,TRIG,CHOLHDL in the last 72 hours  Basename 09/23/11 1400  TSH 4.149  T4TOTAL --  T3FREE --  THYROIDAB --   No results found for this basename: VITAMINB12,FOLATE,FERRITIN,TIBC,IRON,RETICCTPCT in the last 72 hours  Medications: Scheduled    . amLODipine  10 mg Oral Daily  . aspirin EC  325 mg Oral Daily  . cephALEXin  500 mg Oral Q8H  . docusate sodium  200 mg Oral Daily  . fenofibrate  160 mg Oral Daily  . gabapentin  300 mg Oral BID  . glipiZIDE  10 mg Oral QAC breakfast  . guaiFENesin  1,200 mg Oral BID  . hydrochlorothiazide  25 mg Oral Daily  . insulin aspart  0-24 Units Subcutaneous TID AC & HS  . metFORMIN  500 mg Oral BID WC  . metoprolol tartrate  25 mg Oral BID  . pantoprazole  40 mg Oral QAC breakfast  . patient's guide to using coumadin book   Does not apply Once  . ramipril  10 mg Oral BID  . rosuvastatin  10 mg Oral q1800  . sodium chloride  3 mL Intravenous Q12H  . thiamine  100 mg Oral Daily  . warfarin  2.5 mg Oral q1800  . warfarin   Does not apply Once  .  Warfarin - Physician Dosing Inpatient   Does not apply q1800  . DISCONTD: insulin glargine  30 Units Subcutaneous Daily     Radiology/Studies:  No results found.  INR:1.17 Will add last result for INR, ABG once components are confirmed Will add last 4 CBG results once components are confirmed  Assessment/Plan: S/P Procedure(s) (LRB): ENDARTERECTOMY CAROTID (Right) CORONARY ARTERY BYPASS GRAFTING (CABG) (N/A) AORTA -INNOMIATE BYPASS ()  1. Appreciate neuro input, MRI in future, for eeg 2 cont AC RX 3 keflex for cellulitis 4 magic mouthwash ordered 5 sugars controlled 6 bp a little better control   LOS: 15 days    GOLD,WAYNE E 5/2/20138:30 AM     Chart reviewed, patient examined, agree with above. He is slowly improving.  Mental status is much better than when I last saw him Friday of last week.   Right leg incisions look ok.  I don't see any significant cellulitis  at this time.  Would continue keflex. Waiting on INR to reach therapeutic. I would not perform MRI at this time.  It is not going to change management and has some risk. Discussed status with wife at bedside.

## 2011-09-24 NOTE — Progress Notes (Signed)
Patient ambulated approximately 350 feet with walker x2 assist stoping to rest multiple times. At end of walk needing to be wheeled in chair back to room. Pt in chair will monitor patient. [name], Bettina Gavia RN

## 2011-09-24 NOTE — Care Management Note (Addendum)
Page 1 of 2   09/28/2011     5:01:48 PM   CARE MANAGEMENT NOTE 09/28/2011  Patient:  [name], [name]   Account Number:  [id number]  Date Initiated:  09/14/2011  Documentation initiated by:  Earl Losee  Subjective/Objective Assessment:   PT S/P CABG X3 ON 09/09/11.  PTA, PT INDEPENDENT, LIVES WITH SPOUSE.     Action/Plan:   WIFE AND CHILDREN TO PROVIDE 24 HR CARE AT DISCHARGE.  WILL FOLLOW FOR HOME NEEDS AS PT PROGRESSES.   Anticipated DC Date:  09/28/2011   Anticipated DC Plan:  SKILLED NURSING FACILITY  In-house referral  Clinical Social Worker      DC Forensic scientist  CM consult      [facility] Choice  HOME HEALTH   Choice offered to / List presented to:  [name]   DME arranged  [place]      DME agency  [place] arranged  HH-1 RN  [place] [facility]      [place].   Status of service:  Completed, signed off Medicare Important Message given?   (If response is "NO", the following Medicare IM given date fields will be blank) Date Medicare IM given:   Date Additional Medicare IM given:    Discharge Disposition:  [place]  Per UR Regulation:    If discussed at Long Length of Stay Meetings, dates discussed:   09/23/2011    Comments:  09/28/11 Zeferino Mounts,RN,BSN 1630 EXTENSIVE CONVERSATION WITH DAUGHTER ABBY:  SHE IS AGAIN UPSET WITH COMMUNICATION BETWEEN THOSE CARING FOR HER FATHER.  I EXPLAINED THAT NOW THAT DC PLAN HAD CHANGED, I WAS COMMUNICATING EXCLUSIVELY WITH PT'S WIFE AND FELT SURE THAT SHE WOULD FILL HER IN ON OUR CONVERSATION.  SHE IS NOT HAPPY WITH THIS AND WANTS INFO ON HOW TO FILE A FORMAL [place].  NUMBER GIVEN TO OFFICE OF PT EXPERIENCE.  SHE ALSO ELLUDED THAT SHE DID NOT BELIEVE THAT THE PEER TO PEER WAS NOT CARRIED OUT, AS PREVIOUSLY NOTED.  CALLED [employer]...DOCUMENTATION EXISTS THAT PEER TO PEER INITIAL CONTACT DONE 5/2 IN THE EVENING  BETWEEN DR CHO AND DR Reynaldo Minium.  I GAVE ABBY THIS INFORMATION.  09/28/11 Elisavet Buehrer,RN,BSN [id number] MET WITH PT, WIFE AND CSW TO DISCUSS AND FINALIZE DISCHARGE PLANS.  WIFE ULTIMATELY THINKS THAT HOME IS BEST OPTION AND IS HOPEFUL THAT GETTING PT HOME WILL CLEAR CONFUSION.  WIFE AND STEPDAUGHTER TOURED [place] [address] THIS W/E, AND THOUGH THEY WERE QUITE POSITIVE ABOUT FACILITY, THEY DO THINK THAT PT WILL DO BETTER AT HOME.  WIFE STATES THAT THEIR 2 SONS HAVE MADE PLANS TO COME AND ASSIST AT DC.  PT WILL NEED [facility] SET UP, 3 IN 1 AND RW-WIFE PREFERS [facility].  REFERRAL TO [facility]--START OF CARE 24-48H POST DC DATE.  HOPEFUL FOR DC ON WED, 5/8.  WILL FOLLOW FOR ADDITIONAL NEEDS.  09/25/11  10:30 Ricki Miller, RN BSN Spoke with patient's wife regarding Home Halth versus SNF. She states she can not take him home until he is stronger, so they will discuss SNF. She said she was interested in [facility]. Contacted Social Worker-Cora regarding this.  09/24/11 Meygan Kyser,RN,BSN 1650 CONVERSATION WITH PT'S DAUGHTER ABBY:  SHE IS UPSET WITH REHAB NOT ACCEPTING NOT PT.  RECOMMEND PEER TO PEER WITH CM MEDICAL DIRECTOR AND [facility] MEDICAL DIRECTOR.  CALLED IN TO  DR Myna Bright WILL CONTACT DR CHO.  09/24/11 Mayara Paulson,RN,BSN 1430 PT NOW DOING TOO WELL FOR INPT REHAB, PER [facility] CRITERIA.  MET WITH PT AND FAMILY TO DISCUSS ALTERNATIVE DC PLANS.  THEY ARE NOT HAPPY ABOUT REHAB NOT ACCEPTING, BUT ARE AGREEABLE TO BEING FAXED OUT FOR SKILLED NURSING FACILITY FOR REHAB.  WILL CONSULT CSW TO FACILITATE DC TO SNF FOR SHORT TERM REHAB WHEN MEDICALLY STABLE.  WIFE GIVEN LIST OF AREA SKILLED NURSING FACILITIES FOR REFERENCE, PER HER REQUEST.  09/23/11 Pratyush Ammon,RN,BSN INSURANCE CO DID NOT APPROVE PT FOR INPT REHAB STAY. DTR/WIFE INSISTANT ON APPEAL.  NEURO CONSULT PENDING.  WILL FOLLOW FOR RESULTS OF CONSULTS AND INSURANCE APPEAL.  09/22/11 Mrytle Bento,RN,BSN 1200 PT IMPROVING, NOW ON ACUTE UNIT.  CONFUSION MUCH IMPROVED. PT  AND OT RE-ORDERED.  REHAB MD TO SEE FOR POSSIBLE INPATIENT REHAB ADMISSIONS.  WILL FOLLOW.  09/18/11 Haniyah Maciolek,RN,BSN PT STILL WITH POST OP CONFUSION, IMPROVING.  PT RECOMMENDING IP REHAB.  WILL OBTAIN OT CONSULT.  WILL FOLLOW...MAY NEED SNF IF NOT A CANDIDATE FOR CIR.  WILL FOLLOW.

## 2011-09-24 NOTE — Progress Notes (Signed)
Portable EEG completed

## 2011-09-24 NOTE — Progress Notes (Signed)
Received a call from pt daugther who had concerns about pts discharge plan, requesting to speak with case manager/social Research officer, trade union.  Message given to appropriate partys with the understanding that they would call the daughter.  Will continue to monitor patient. Bastien Strawser, Bettina Gavia RN

## 2011-09-24 NOTE — Progress Notes (Signed)
[facility]   Subjective  - No significant change  Objective 159/67 70 99.1 F (37.3 C) (Oral) 20 94%  Intake/Output Summary (Last 24 hours) at 09/24/11 0818 Last data filed at 09/24/11 0500  Gross per 24 hour  Intake    120 ml  Output    550 ml  Net   -430 ml   Neck incision healing Neuro motor intact  Assessment/Planning: Appreciate Neuro input.  Head MRI when ok with cardiac surgery.  Otherwise continue PT/OT ambulation. Urinalysis negative. TSH normal.   Mild leukocytosis on keflex for vein harvest site.  On heparin/coumadin for DVT.  Daughter updated.  [name] E 09/24/2011 8:18 AM --  Laboratory Lab Results:  Basename 09/24/11 0550 09/23/11 0520  WBC 12.7* 11.4*  HGB 9.4* 10.0*  HCT 29.9* 32.2*  PLT 283 303   BMET  Basename 09/22/11 0730  NA 137  K 3.9  CL 96  CO2 32  GLUCOSE 173*  BUN 17  CREATININE 0.71  CALCIUM 8.6    COAG Lab Results  Component Value Date   INR 1.17 09/24/2011   INR 1.13 09/23/2011   INR 1.48 09/09/2011   No results found for this basename: PTT

## 2011-09-24 NOTE — Progress Notes (Signed)
I walked with pt's wife and son following pt in hallway while pt ambulated with nurse and NT from room 2018 to unit 2100. I explained that aarp medicare denial for admission to inpt rehab remains. I recommend at this time that patient discharges to SNF rehab for  Rehabilitation at a lower level of intensity if family feels not functionally ready to d/c home with home health and family assist. Discussed that patient has improved functionally to a level to not need the intensity of inpt hospital rehab at this time.  Attending MD can contact me to initiate peer to peer discussion with the Medical Director at [facility] if you choose to appeal this decision. I have contacted Dr. Naaman Plummer and he is aware of the functional progress patient has made at this time. RN CM is aware of my recommendations. Contact me at [phone] with questions.

## 2011-09-24 NOTE — Progress Notes (Signed)
Physical Therapy Treatment Patient Details Name: [name] MRN: [id number] DOB: 06/16/1940 Today's Date: 09/24/2011 Time: [id number] PT Time Calculation (min): 38 min  PT Assessment / Plan / Recommendation Comments on Treatment Session  Pt s/p CABG, aorta bypass and CEA with AMS that is improving but continues to limit safe activity progression. Pt able to recall surgeries performed today but not precautions and educated for all sternal precautions. Deferred HEP due to pt fatigue. Pt still unable to safely judge his ability to perform tasks or assist needed. Discussed with wife differences in discharge options and therapy frequency. Will continue to follow to progress mobility.     Follow Up Recommendations       Equipment Recommendations       Frequency     Plan Discharge plan remains appropriate    Precautions / Restrictions Precautions Precautions: Sternal;Fall Precaution Comments: oxygen   Pertinent Vitals/Pain Pt reports RLE pain with movement Attempted trial on RA but sats dropped to 88% sitting EOB    Mobility  Bed Mobility Bed Mobility: Rolling Left;Left Sidelying to Sit;Sit to Sidelying Left Rolling Left: 3: Mod assist Left Sidelying to Sit: 2: Max assist;HOB flat Sitting - Scoot to Edge of Bed: 3: Mod assist Sit to Supine: Not Tested (comment) Sit to Sidelying Left: 2: Max assist;HOB flat Scooting to HOB: Not tested (comment) Transfers Transfers: Sit to Stand;Stand to Sit Sit to Stand: 4: Min assist;From bed Stand to Sit: 4: Min assist;To bed Stand Pivot Transfers: Not tested (comment) Details for Transfer Assistance: cueing for hand placement on thighs, control of descent, sequence and backing to surface Ambulation/Gait Ambulation/Gait Assistance: 1: +2 Total assist (+1 for lines and safety) Ambulation/Gait: Patient Percentage: 80 Ambulation Distance (Feet): 150 Feet Assistive device: Rolling walker Ambulation/Gait Assistance Details: cues throughout for  direction, stepping into RW and to look up as pt maintains head down Gait Pattern: Step-through pattern;Trunk flexed;Decreased stride length Stairs: No    Exercises     PT Goals Acute Rehab PT Goals Pt will Roll Supine to Left Side: with min assist PT Goal: Rolling Supine to Left Side - Progress: Revised due to lack of progress PT Goal: Supine/Side to Sit - Progress: Progressing toward goal PT Goal: Sit to Supine/Side - Progress: Progressing toward goal PT Goal: Sit to Stand - Progress: Progressing toward goal PT Goal: Stand to Sit - Progress: Progressing toward goal PT Goal: Ambulate - Progress: Progressing toward goal PT Goal: Up/Down Stairs - Progress: Not progressing PT Goal: Perform Home Exercise Program - Progress: Progressing toward goal  Visit Information  Last PT Received On: 09/24/11 Assistance Needed: +2    Subjective Data  Subjective: i don't understand this test   Cognition  Overall Cognitive Status: Impaired Area of Impairment: Attention;Memory;Following commands;Safety/judgement;Awareness of errors;Awareness of deficits;Problem solving Arousal/Alertness: Awake/alert Orientation Level: Appears intact for tasks assessed Behavior During Session: [facility] for tasks performed Current Attention Level: Sustained Memory: Decreased recall of precautions Memory Deficits: pt able to recall one of the sternal precautions Following Commands: Follows one step commands inconsistently Safety/Judgement: Decreased awareness of safety precautions;Decreased awareness of need for assistance Cognition - Other Comments: Pt able to follow two step commands at times. Pt more alert and able to recall parts of session by end which he has been unable to do lately.    Balance     End of Session PT - End of Session Equipment Utilized During Treatment: Gait belt Activity Tolerance: Patient tolerated treatment well Patient left: in bed;with call bell/phone  within reach;with family/visitor  present Nurse Communication: Mobility status    Melford Aase 09/24/2011, 4:03 PM Elwyn Reach, [place]

## 2011-09-25 DIAGNOSIS — G9340 Encephalopathy, unspecified: Secondary | ICD-10-CM

## 2011-09-25 LAB — URINE CULTURE: Colony Count: >=100000

## 2011-09-25 LAB — CBC
HCT: 29.9 % — ABNORMAL LOW (ref 39.0–52.0)
Hemoglobin: 9.3 g/dL — ABNORMAL LOW (ref 13.0–17.0)
MCV: 92.6 fL (ref 78.0–100.0)
Platelets: 418 10*3/uL — ABNORMAL HIGH (ref 150–400)
RBC: 3.23 MIL/uL — ABNORMAL LOW (ref 4.22–5.81)
WBC: 10.6 10*3/uL — ABNORMAL HIGH (ref 4.0–10.5)

## 2011-09-25 LAB — HEMOGLOBIN A1C
Hgb A1c MFr Bld: 6.4 % — ABNORMAL HIGH (ref <5.7)
Mean Plasma Glucose: 137 mg/dL — ABNORMAL HIGH (ref <117)

## 2011-09-25 LAB — GLUCOSE, CAPILLARY: Glucose-Capillary: 146 mg/dL — ABNORMAL HIGH (ref 70–99)

## 2011-09-25 MED ORDER — PIOGLITAZONE HCL 15 MG PO TABS
15.0000 mg | ORAL_TABLET | Freq: Every day | ORAL | Status: DC
  Administered 2011-09-25 – 2011-09-28 (×4): 15 mg via ORAL
  Filled 2011-09-25 (×5): qty 1

## 2011-09-25 MED ORDER — FLUCONAZOLE 100 MG PO TABS
100.0000 mg | ORAL_TABLET | Freq: Every day | ORAL | Status: DC
  Administered 2011-09-25 – 2011-09-29 (×5): 100 mg via ORAL
  Filled 2011-09-25 (×5): qty 1

## 2011-09-25 MED ORDER — WARFARIN SODIUM 7.5 MG PO TABS
7.5000 mg | ORAL_TABLET | Freq: Every day | ORAL | Status: DC
  Administered 2011-09-25 – 2011-09-26 (×2): 7.5 mg via ORAL
  Filled 2011-09-25 (×3): qty 1

## 2011-09-25 NOTE — Progress Notes (Signed)
Physical Therapy Treatment Patient Details Name: [name] MRN: [id number] DOB: 1940-06-02 Today's Date: 09/25/2011 Time: [id number] PT Time Calculation (min): 51 min  PT Assessment / Plan / Recommendation Comments on Treatment Session  Pt s/p CABG, aorta bybass, and CEA with AMS who is continuing to progress with activity and mentation but limited by fatigue. Pt still unable to recall precautions and sequence of transfers but performing with decreased assist needed. Pt fatigued after bed mobility and stairs and required seated rest in stairwell before return ambulation to room and having to sit in chair just before  returning to room. Pt and wife educated throughout for precautions and safety.     Follow Up Recommendations  Skilled nursing facility;Supervision/Assistance - 24 hour    Equipment Recommendations  Defer to next venue (vs TBD)    Frequency     Plan Discharge plan needs to be updated;Frequency remains appropriate    Precautions / Restrictions Precautions Precautions: Sternal;Fall Precaution Comments: o Restrictions Weight Bearing Restrictions: No   Pertinent Vitals/Pain RLE pain, RN notified and end of session sore back from activity HR 73-82 throughout session    Mobility  Bed Mobility Bed Mobility: Rolling Left;Left Sidelying to Sit;Sitting - Scoot to [employer] of Bed;Sit to Sidelying Left Rolling Left: 4: Min assist;4: Min guard Left Sidelying to Sit: 4: Min assist;4: Min guard;HOB flat Sitting - Scoot to [employer] of Bed: 4: Min guard Sit to Sidelying Left: 5: Supervision;HOB flat Details for Bed Mobility Assistance: pt performed rolling to left and side to sit x 2 trials with progression from min assist to minguard with cueing to sequence adhering to precautions. Pt able to return to sidely and supine with supervision for safety but without cueing Transfers Transfers: Sit to Stand;Stand to Sit Sit to Stand: 4: Min assist;1: +2 Total assist;From bed;Other (comment)  (pt +2 to stand from stair well after pt became fatigued) Sit to Stand: Patient Percentage: 50% Stand to Sit: 4: Min guard;To chair/3-in-1;Other (comment) (step in stairwell) Details for Transfer Assistance: cueing for hand placement and control of descent Ambulation/Gait Ambulation/Gait Assistance: 4: Min guard Ambulation Distance (Feet): 150 Feet (150', 100' over 2 trials after seated rest) Assistive device: Rolling walker Ambulation/Gait Assistance Details: cues for head up, pt able to maintain position in RW without cueing today with multiple standing rests Gait Pattern: Step-through pattern;Decreased stride length Gait velocity: decreased Stairs: Yes Stairs Assistance: 4: Min assist Stairs Assistance Details (indicate cue type and reason): Pt initially performed one step with rail with min assist but then deferred this method as pt trying to pull up on rail. Performed two steps with RW backward with min assist and wife present but pt fatigued and sat end of stair trial Stair Management Technique: One rail Right;Backwards;Forwards;With walker Number of Stairs: 2     Exercises     PT Goals Acute Rehab PT Goals Pt will Roll Supine to Left Side: with supervision PT Goal: Rolling Supine to Left Side - Progress: Updated due to goal met Pt will go Supine/Side to Sit: with supervision PT Goal: Supine/Side to Sit - Progress: Updated due to goal met Pt will go Sit to Supine/Side: with modified independence PT Goal: Sit to Supine/Side - Progress: Updated due to goal met PT Goal: Sit to Stand - Progress: Progressing toward goal PT Goal: Stand to Sit - Progress: Progressing toward goal PT Goal: Ambulate - Progress: Progressing toward goal Pt will Go Up / Down Stairs: 3-5 stairs;with min assist;with least restrictive assistive device  PT Goal: Up/Down Stairs - Progress: Progressing toward goal  Visit Information  Last PT Received On: 09/25/11 Assistance Needed: +1    Subjective Data   Subjective: i'm worn out   Cognition  Overall Cognitive Status: Impaired Area of Impairment: Memory;Safety/judgement;Problem solving Arousal/Alertness: Awake/alert Orientation Level: Appears intact for tasks assessed Behavior During Session: [facility] for tasks performed Current Attention Level: Selective Attention - Other Comments: for up to ~5min Memory: Decreased recall of precautions Memory Deficits: pt unable to state sternal precautions but did understand the purpose of preventing dehiscence Following Commands: Follows one step commands consistently Safety/Judgement: Decreased awareness of need for assistance Cognition - Other Comments: Performed multi-step tasks sitting EOB. Pt easily frustrated, but following commands much more consistently. If task took longer than 4-75min, but required VC to complete steps    Balance     End of Session PT - End of Session Equipment Utilized During Treatment: Gait belt Activity Tolerance: Patient limited by fatigue Patient left: in chair;with call bell/phone within reach;with family/visitor present Nurse Communication: Mobility status    Melford Aase 09/25/2011, 4:43 PM Elwyn Reach, [place]

## 2011-09-25 NOTE — Progress Notes (Signed)
Clinical Social Work Department CLINICAL SOCIAL WORK PLACEMENT NOTE 09/25/2011  Patient:  CHAMBERLAIN, MARKSON  Account Number:  [id number] Admit date:  09/09/2011  Clinical Social Worker:  Jerral Ralph, LCSWA  Date/time:  09/23/2011 04:00 PM  Clinical Social Work is seeking post-discharge placement for this patient at the following level of care:   SKILLED NURSING   (*CSW will update this form in Epic as items are completed)   09/25/2011  Patient/family provided with [place] Department of Clinical Social Work's list of facilities offering this level of care within the geographic area requested by the patient (or if unable, by the patient's family).  09/25/2011  Patient/family informed of their freedom to choose among providers that offer the needed level of care, that participate in Medicare, Medicaid or managed care program needed by the patient, have an available bed and are willing to accept the patient.  09/25/2011  Patient/family informed of [facility]' ownership interest in [facility], as well as of the fact that they are under no obligation to receive care at this facility.  PASARR submitted to EDS on 09/23/2011 PASARR number received from EDS on   FL2 transmitted to all facilities in geographic area requested by pt/family on  09/23/2011 FL2 transmitted to all facilities within larger geographic area on   Patient informed that his/her managed care company has contracts with or will negotiate with  certain facilities, including the following:     Patient/family informed of bed offers received:  09/25/2011 Patient chooses bed at  Physician recommends and patient chooses bed at    Patient to be transferred to  on   Patient to be transferred to facility by   The following physician request were entered in Epic:   Additional Comments:

## 2011-09-25 NOTE — Progress Notes (Signed)
Occupational Therapy Treatment Patient Details Name: [name] MRN: [id number] DOB: Nov 18, 1940 Today's Date: 09/25/2011 Time: [id number] OT Time Calculation (min): 19 min  OT Assessment / Plan / Recommendation Comments on Treatment Session Progressing. Although still demonstrating deficits, especially with attention and ability to perform multiple steps, cognition appears somewhat improved today.     Follow Up Recommendations  Home health OT;Supervision/Assistance - 24 hour (vs SNF)    Equipment Recommendations  Defer to next venue (vs TBD)    Frequency     Plan Discharge plan needs to be updated (pt denied by CIR)    Precautions / Restrictions Precautions Precautions: Sternal;Fall Restrictions Weight Bearing Restrictions: No   Pertinent Vitals/Pain Pt. C/o RLE pain. Did not rate, but RN made aware and brought pain meds    ADL  Lower Body Dressing: Performed;Supervision/safety (doffed left sock) Where Assessed - Lower Body Dressing: Sitting, bed Ambulation Related to ADLs: Min A for 2 side steps up to [facility] ADL Comments: Pt had just finished with cardiac rehab and refused ambulation, but was agreeable to EOB activities. Performed multi-step tasks sitting EOB to work on attention. Pt's RLE incisions draining through bandage and pt unable to concentrate on therapy any longer. Returned to supine for nsg to change dsg.    OT Goals ADL Goals ADL Goal: Lower Body Dressing - Progress: Progressing toward goals ADL Goal: Additional Goal #1 - Progress: Progressing toward goals  Visit Information  Last OT Received On: 09/25/11 Assistance Needed: +1    Subjective Data      Prior Functioning       Cognition  Orientation Level: Appears intact for tasks assessed Behavior During Session: Anxious Current Attention Level: Sustained Attention - Other Comments: for up to ~15min Memory: Decreased recall of precautions Following Commands: Follows one step commands consistently;Follows  multi-step commands inconsistently Cognition - Other Comments: Performed multi-step tasks sitting EOB. Pt easily frustrated, but following commands much more consistently. If task took longer than 4-7min, but required VC to complete steps    Mobility Bed Mobility Rolling Left: With rail;4: Min assist Left Sidelying to Sit: 4: Min assist;HOB flat;With rails Sit to Sidelying Left: 3: Mod assist;With rail;HOB flat Details for Bed Mobility Assistance: Pt easily distracted when going to sit EOB and asking for step-by-step directions. Had pt return to side-lying and instructed him to, "sit up." Pt performed this much more fluidly. Transfers Sit to Stand: 4: Min assist;From bed;With upper extremity assist Stand to Sit: 4: Min assist;To bed;With upper extremity assist   Exercises    Balance    End of Session OT - End of Session Activity Tolerance:  (limited by drainage from leg distracting pt) Patient left: in bed;with call bell/phone within reach;with nursing in room Nurse Communication: Mobility status   Dorissa Stinnette 09/25/2011, 3:01 PM

## 2011-09-25 NOTE — Progress Notes (Signed)
CARDIAC REHAB PHASE I   PRE:  Rate/Rhythm: 71 SR    BP: sitting 123/56    SaO2: 92 RA  MODE:  Ambulation: 440 ft   POST:  Rate/Rhythm: 83 SR    BP: sitting 125/49     SaO2: 93-95 RA  Pt remembered me and what I came to do upon entering room. Able to converse appropriately today. Very grouchy and sarcastic. Did not want to walk or be told what to do. Ambulated well with minimum assist (assist x2 to stand and supervision x2 walking, assist x2 to get into bed). Did not require O2. To bed after walk for nap. Sts he does not want to walk with PT later. Will f/u tom.  [place], [place], ACSM

## 2011-09-25 NOTE — Progress Notes (Signed)
Clinical Social Work Department BRIEF PSYCHOSOCIAL ASSESSMENT 09/25/2011  Patient:  [name], [name]     Account Number:  [id number]     Admit date:  09/09/2011  Clinical Social Worker:  Cephus Slater  Date/Time:  09/25/2011 03:00 PM  Referred by:  Care Management  Date Referred:  09/25/2011 Referred for  SNF Placement   Other Referral:   Interview type:  Family Other interview type:   Spoke with pt, pt wife and daughter    PSYCHOSOCIAL DATA Living Status:  FAMILY Admitted from facility:   Level of care:   Primary support name:  Sabien, Makarewicz B Primary support relationship to patient:  SPOUSE Degree of support available:   Strong    CURRENT CONCERNS Current Concerns  Post-Acute Placement   Other Concerns:    SOCIAL WORK ASSESSMENT / PLAN CSW asked to assist this pt and family with determining appropriate disposition. Pt was hopeful for CIR, but unable to secure this arrangement due to insurance barriers and pt progression with PT.    CSW spoke with pt and pt wife regarding SNF placement. Provided list of bed offers and answered many questions. Pt wife appreciative and plans to consider her options for facilities.    CSW contact by pt daughter by phone who expressed multiple concerns regarding pt disposition and expressed some amount of discontent regarding communication. Pt daughter states she wants to discuss "Plan A" (CIR) with RNCM on Monday, and was unwilling to discuss this with CSW. CSW provided information on SNF placement, as this is "Plan B" and attempted to provide information on "Plan C" (home health services), however pt daughter seemed unable to receive information at this time.  CSW advised pt daughter pt would likely be ready for d/c on Monday, and pt daughter confirmed that she and pt wife would be looking at facilities this weekend. CSW reviewed pt wife concerns about providing care for pt in the home.  CSW will advise RNCM of pt daughter's demand to  speak on Monday morning and will follow to facilitate d/c to SNF if this is, indeed, the disposition decided on by the family.   Assessment/plan status:  Information/Referral to [employer] Other assessment/ plan:   Information/referral to community resources:    PATIENT'S/FAMILY'S RESPONSE TO PLAN OF CARE: Pt and pt wife receptive to intervention and appreciative of CSW support and assistance with d/c planning. Pt daughter agitated and aggressive in her communication and primarily expressed frustration and unhappiness with her options.

## 2011-09-25 NOTE — Progress Notes (Addendum)
ANTICOAGULATION CONSULT NOTE - Follow Up Consult  Pharmacy Consult for Heparin Indication: DVT (LUE)  Assessment: 71 yo male s/p CABG and R CEA on 4/18 found to have a LUE DVT and heparin therapy was initiated 4/29. Heparin level of 0.44 is therapeutic on  1400 units/hr.  Coumadin per MD day#4/5 minimum overlap required for VTE. INR 1.14 after coumadin 2.5-2.5-5 mg per MD.  New diflucan started for thrush which may increase INR.  No bleeding reported. CBC stable.  Goal of Therapy:  Heparin level 0.3-0.7 units/ml   Plan:  1. Continue heparin at 1400 units/hr 2. Coumadin per MD - dose increased to 7.5 mg po daily 3. Daily heparin level and CBC 4. Coumadin education completed 5/1 with family members Eudelia Bunch, Pharm.D. [id number] 09/25/2011 11:17 AM   Allergies  Allergen Reactions  . Amoxicillin-Pot Clavulanate Nausea Only    Patient Measurements: Height: 5' 10.87" (180 cm) (on 09/07/11) Weight: 282 lb 8 oz (128.141 kg) IBW/kg (Calculated) : 74.99  Heparin Dosing Weight: 105 kg  Vital Signs: Temp: 99.2 F (37.3 C) (05/03 0604) BP: 152/49 mmHg (05/03 1049) Pulse Rate: 70  (05/03 1049)  Labs:  Basename 09/25/11 0555 09/24/11 0630 09/24/11 0550 09/23/11 0520  HGB 9.3* -- 9.4* --  HCT 29.9* -- 29.9* 32.2*  PLT 418* -- 283 303  APTT -- -- -- --  LABPROT 14.8 15.1 -- 14.7  INR 1.14 1.17 -- 1.13  HEPARINUNFRC 0.44 0.58 -- 0.62  CREATININE -- -- -- --  CKTOTAL -- -- -- --  CKMB -- -- -- --  TROPONINI -- -- -- --   Estimated Creatinine Clearance: 113.6 ml/min (by C-G formula based on Cr of 0.71).   Medications:  Infusions:     . heparin 1,400 Units/hr (09/25/11 0852)

## 2011-09-25 NOTE — Progress Notes (Addendum)
VASCULAR AND VEIN SURGERY POST - OP CEA PROGRESS NOTE  Date of Surgery: 09/09/2011 Surgeon: Surgeon(s): Gaye Pollack, MD Elam Dutch, MD 16 Days Post-Op right Carotid Endarterectomy .  HPI: [name] is a 71 y.o. male who is 16 Days Post-Op right Carotid Endarterectomy . Patient is doing well. Pre-operative symptoms are Improved Patient denies headache; Patient denies difficulty swallowing; denies weakness in upper or lower extremities; Pt. denies other symptoms of stroke or TIA.  IMAGING: No results found.  Significant Diagnostic Studies: CBC Lab Results  Component Value Date   WBC 10.6* 09/25/2011   HGB 9.3* 09/25/2011   HCT 29.9* 09/25/2011   MCV 92.6 09/25/2011   PLT 418* 09/25/2011    BMET    Component Value Date/Time   NA 137 09/22/2011 0730   K 3.9 09/22/2011 0730   CL 96 09/22/2011 0730   CO2 32 09/22/2011 0730   GLUCOSE 173* 09/22/2011 0730   BUN 17 09/22/2011 0730   CREATININE 0.71 09/22/2011 0730   CALCIUM 8.6 09/22/2011 0730   GFRNONAA >90 09/22/2011 0730   GFRAA >90 09/22/2011 0730    COAG Lab Results  Component Value Date   INR 1.14 09/25/2011   INR 1.17 09/24/2011   INR 1.13 09/23/2011   No results found for this basename: PTT      Intake/Output Summary (Last 24 hours) at 09/25/11 0849 Last data filed at 09/25/11 [id number]  Gross per 24 hour  Intake 982.33 ml  Output   2200 ml  Net -1217.67 ml    Physical Exam:  BP Readings from Last 3 Encounters:  09/25/11 153/66  09/25/11 153/66  09/07/11 162/59   Temp Readings from Last 3 Encounters:  09/25/11 99.2 F (37.3 C)   09/25/11 99.2 F (37.3 C)   09/07/11 97.7 F (36.5 C) Oral   SpO2 Readings from Last 3 Encounters:  09/25/11 94%  09/25/11 94%  09/07/11 96%   Pulse Readings from Last 3 Encounters:  09/25/11 73  09/25/11 73  09/07/11 57    Pt is Alert, still slightly confused Gait is normal Speech is normal right Neck Wound is healing well Patient with Negative tongue deviation and  Negative facial droop Pt has good and equal strength in all extremities Left upper extremity edema due to DVT Assessment: [name] is a 71 y.o. male is S/P Right Carotid endarterectomy Pt is voiding, ambulating and taking po well   Plan 1.:DVT LUE-Continue heparin gttp and Coumadin  2.Leukocytosis-WBC decreased from 12.7 to 10.6.On Keflex for posible RLE cellulitis.Preliminary UC shows >100,000 gram negative rods. Will await final culture for antibiotic sensitivity.  3. Neuro-EEG done yesterday and results suggest mild to moderate encephalopathy of nonspecific etiology but no seizures 4. Continue PT  Laurence Slate [facility] [id number] 09/25/2011 8:49 AM   No significant change will recheck on Monday.  Ruta Hinds, MD [facility] Office: [phone] Pager: [phone]

## 2011-09-25 NOTE — Progress Notes (Signed)
CSW provided bed offers to pt wife, explained SNF placement process, and answered many questions. Pt wife had been hopeful for [facility], but pt is out of network for this facility. CSW provided empathy and support for the difficult situation this family is in. Will follow to facilitate d/c to SNF likely Monday.  Jerral Ralph, MSW [phone]

## 2011-09-25 NOTE — Progress Notes (Signed)
[facility] PROGRESS NOTE    SUBJECTIVE   Mental status has improved per wife. HE is able to tell me where he is, year, month and hold a conversation.   OBJECTIVE   Vital signs in last 24 hours: Temp:  [98.4 F (36.9 C)-99.2 F (37.3 C)] 99.2 F (37.3 C) (05/03 0604) Pulse Rate:  [64-83] 70  (05/03 1049) Resp:  [18-20] 18  (05/03 0604) BP: (145-153)/(49-69) 152/49 mmHg (05/03 1049) SpO2:  [94 %-96 %] 94 % (05/03 0604)  Intake/Output from previous day: 05/02 0701 - 05/03 0700 In: 982.3 [I.V.:982.3] Out: 2200 [Urine:2200] Intake/Output this shift:   Nutritional status: Carb Control  Past Medical History  Diagnosis Date  . Chest pain   . Muscle pain   . Diarrhea   . Hyperlipidemia     takes Tricor and Lipitor daily  . Hyperlipidemia   . Atherosclerotic heart disease   . PVD (peripheral vascular disease)   . Peripheral neuropathy   . Seasonal allergies     takes Mucinex and Zyrtec prn  . Nasal polyps     hx of  . Edema     right leg knee down swollen since fall 3 wks ago  . Back pain   . GERD (gastroesophageal reflux disease)     Rolaids as needed-occ reflux  . Colon polyps   . Renal insufficiency   . Urinary frequency     urgency/takes Vesicare daily  . Kidney stone     35+yrs ago  . Pneumonia     as a child  . Hypertension     takes Altace,Amlodipine,and Nadolol daily  . Peripheral edema     wears knee length hose-told by podiatrist to wear these  . Peripheral neuropathy     takes Gabapentin daily  . Arthritis   . Bruises easily     takes Pletal daily and ASA 325mg  daily  . Hemorrhoids   . Cyst     on perineum;takes Doxycycline daily  . History of kidney stones     at age 62   . Diabetes mellitus     takes Glipizide,Metformin,and Actos daily  . Cataract immature     right    Neurologic Exam:  Mental Status:  Alert, oriented, to [place]. He is able to hold a conversation and tell me he is  in the hospital. He desires to go home. Able to follow 3 step commands without difficulty.  Cranial Nerves:  II-Visual fields grossly intact.  III/IV/VI-Extraocular movements intact. Pupils reactive bilaterally.  V/VII-Smile symmetric  VIII-grossly intact  IX/X-normal gag  XI-bilateral shoulder shrug  XII-midline tongue extension  Motor: 5/5 bilaterally UE and 4/5 bilaterally LE. No drift noted on UE or LE. Normal tone and bulk. No tremor  Sensory: Pinprick and light touch intact throughout, bilaterally Deep Tendon Reflexes: 1+ and symmetric throughout  Plantars: Downgoing bilaterally  Cerebellar: Normal finger-to-nose   Lab Results: No results found for this basename: cbc, bmp, coags, chol, tri, ldl, hga1c   Lipid Panel No results found for this basename: CHOL,TRIG,HDL,CHOLHDL,VLDL,LDLCALC in the last 72 hours  Studies/Results: No results found.  Medications:     Scheduled:   . amLODipine  10 mg Oral Daily  . aspirin EC  325 mg Oral Daily  . cephALEXin  500 mg Oral Q8H  . docusate sodium  200 mg Oral Daily  . fenofibrate  160 mg Oral Daily  . fluconazole  100 mg Oral Daily  . gabapentin  300 mg Oral BID  . glipiZIDE  10 mg Oral QAC breakfast  . guaiFENesin  1,200 mg Oral BID  . hydrochlorothiazide  25 mg Oral Daily  . insulin aspart  0-24 Units Subcutaneous TID AC & HS  . metFORMIN  500 mg Oral BID WC  . metoprolol tartrate  25 mg Oral BID  . pantoprazole  40 mg Oral QAC breakfast  . pioglitazone  15 mg Oral Daily  . ramipril  10 mg Oral BID  . rosuvastatin  10 mg Oral q1800  . sodium chloride  3 mL Intravenous Q12H  . thiamine  100 mg Oral Daily  . warfarin  7.5 mg Oral q1800  . Warfarin - Physician Dosing Inpatient   Does not apply q1800  . DISCONTD: warfarin  5 mg Oral q1800   EEG: This routine EEG done with the patient awake, but confused and drowsy is abnormal. Background activities in the high- frequency theta range suggest a mild-to-moderate encephalopathy  of  nonspecific etiology. No interictal epileptiform discharges, electrographic seizures or nonconvulsive status epilepticus was seen.  Assessment/Plan:   71 YO male 14 days S/P CABG and Right CEA. Since admission patient has remained confused. Per family patient does have history of ICU psychosis during last admission. CT head on 09/16/11 shows stable noncontrast CT appearance of the brain with chronic periventricular white matter changes. Left internal jugular vein, subclavian vein and axillary vein DVT on [place] now on coumadin with heparin bridge per pharmacy with INR 1.14 today.   EEG shows slowing but no seizure activity.   Unable to obtain MRI while in hospital. At this time mentation is improving, He is on coumadin with heparin bridge.   Recommend:  1) Out patient Neurology F/U if mentation does not improve after discharge.   2) Keep sedating medications to a minimum  Neurology will sign off.    Etta Quill PA-C [facility] [phone]  09/25/2011, 2:00 PM

## 2011-09-25 NOTE — Progress Notes (Addendum)
16 Days Post-Op Procedure(s) (LRB): ENDARTERECTOMY CAROTID (Right) CORONARY ARTERY BYPASS GRAFTING (CABG) (N/A) AORTA -INNOMIATE BYPASS ()  Subjective: Patient still with occasional confusion.  Objective: Vital signs in last 24 hours: Patient Vitals for the past 24 hrs:  BP Temp Temp src Pulse Resp SpO2  09/25/11 0604 153/66 mmHg 99.2 F (37.3 C) - 73  18  94 %  09/24/11 2109 146/69 mmHg 98.5 F (36.9 C) Oral 83  20  96 %  09/24/11 1553 - - - 73  - 95 %  09/24/11 1417 145/62 mmHg 98.4 F (36.9 C) Oral 64  20  95 %  09/24/11 1006 156/47 mmHg - - 74  - -   Pre op weight 135.6 kg Current Weight  09/24/11 282 lb 8 oz (128.141 kg)      Intake/Output from previous day: 05/02 0701 - 05/03 0700 In: 982.3 [I.V.:982.3] Out: 2200 [Urine:2200]   Physical Exam:  Cardiovascular: RRR; no murmurs, gallops, or rubs. Pulmonary: Slightly diminished at bases ; no rales, wheezes, or rhonchi. Abdomen: Soft, non tender, bowel sounds present. Extremities: Bilateral lower extremity edema.Serosanginous ooze from right thigh wound. Wounds: Sternal wound is clean and dry.  Lab Results: CBC: Basename 09/25/11 0555 09/24/11 0550  WBC 10.6* 12.7*  HGB 9.3* 9.4*  HCT 29.9* 29.9*  PLT 418* 283   BMET: No results found for this basename: NA:2,K:2,CL:2,CO2:2,GLUCOSE:2,BUN:2,CREATININE:2,CALCIUM:2 in the last 72 hours  PT/INR:  Basename 09/25/11 0555  LABPROT 14.8  INR 1.14   ABG:  INR: Will add last result for INR, ABG once components are confirmed Will add last 4 CBG results once components are confirmed  Assessment/Plan:  1. CV - SR.Continue with Lopressor 25 bid, Ramipril 10 bid, and Norvasc10 daily.Monitor SBP as may need to adjust medications. 2.  Pulmonary - Encourage incentive spirometer. 3.Continue PT. 4.  Acute blood loss anemia - H/H this am satble at 9.3/29.9. 5.DM-CBGs 132/170/193.Await HGA1C results. Continue Glipizide and Metformin.Will restart his Actos for better  glucose control. 6.DVT LUE-Continue heparin gttp and Coumadin. 7.Leukocytosis-WBC decreased from 12.7 to 10.6.On Keflex for posible RLE cellulitis.Preliminary UC shows >100,000 gram negative rods. Will await final culture for antibiotic sensitivity. 8.Neuro-EEG done yesterday and results suggest mild to moderate encephalopathy of nonspecific etiology but no seizures. 9.Remove EPW in am.    ZIMMERMAN,DONIELLE MPA-C 09/25/2011   He continues to improve. I removed some of staples from thigh incision to allow the serosanguinous fluid to drain completely.  Hopefully this will avoid an infected hematoma.  I would continue antibiotic but may need to switch antibiotic after ID and sens of urine culture are back. Increase coumadin dose to 7.5mg . Start diflucan for thrush. Watch INR while on coumadin and diflucan since diflucan may decrease hepatic metabolism.

## 2011-09-26 LAB — PROTIME-INR
INR: 1.29 (ref 0.00–1.49)
Prothrombin Time: 16.3 seconds — ABNORMAL HIGH (ref 11.6–15.2)

## 2011-09-26 LAB — GLUCOSE, CAPILLARY
Glucose-Capillary: 130 mg/dL — ABNORMAL HIGH (ref 70–99)
Glucose-Capillary: 164 mg/dL — ABNORMAL HIGH (ref 70–99)

## 2011-09-26 MED ORDER — CIPROFLOXACIN HCL 500 MG PO TABS
500.0000 mg | ORAL_TABLET | Freq: Two times a day (BID) | ORAL | Status: DC
  Administered 2011-09-26 – 2011-09-29 (×7): 500 mg via ORAL
  Filled 2011-09-26 (×10): qty 1

## 2011-09-26 MED ORDER — METOPROLOL TARTRATE 50 MG PO TABS
50.0000 mg | ORAL_TABLET | Freq: Two times a day (BID) | ORAL | Status: DC
  Administered 2011-09-26 – 2011-09-29 (×7): 50 mg via ORAL
  Filled 2011-09-26 (×8): qty 1

## 2011-09-26 NOTE — Progress Notes (Addendum)
                    [address]            [place] [address]          [phone]     17 Days Post-Op Procedure(s) (LRB): ENDARTERECTOMY CAROTID (Right) CORONARY ARTERY BYPASS GRAFTING (CABG) (N/A) AORTA -INNOMIATE BYPASS ()  Subjective: Comfortable at present.  Still has episodes of confusion.  No new complaints.  Objective: Vital signs in last 24 hours: Patient Vitals for the past 24 hrs:  BP Temp Temp src Pulse Resp SpO2 Weight  09/26/11 0624 160/78 mmHg - - - - - 128.277 kg (282 lb 12.8 oz)  09/26/11 0448 187/69 mmHg 100 F (37.8 C) Oral 76  19  91 % -  09/25/11 2020 158/70 mmHg 99.1 F (37.3 C) Oral 67  18  91 % -  09/25/11 1632 - - - 73  - - -  09/25/11 1439 125/65 mmHg 97.9 F (36.6 C) Oral 69  18  93 % -  09/25/11 1049 152/49 mmHg - - 70  - - -   Current Weight  09/26/11 128.277 kg (282 lb 12.8 oz)  Pre op weight 135.6 kg   Intake/Output from previous day: 05/03 0701 - 05/04 0700 In: 587.7 [P.O.:240; I.V.:347.7] Out: 1725 [Urine:1725]  CBGs [phone]  PHYSICAL EXAM:  Heart: RRR Lungs: clear Wound: sternal wound clean and dry, R thigh wound dressing removed with small amt serosanguinous drainage, no purulence, mild erythema. Extremities: +LE edema bilaterally   Lab Results: CBC: Basename 09/25/11 0555 09/24/11 0550  WBC 10.6* 12.7*  HGB 9.3* 9.4*  HCT 29.9* 29.9*  PLT 418* 283   BMET: No results found for this basename: NA:2,K:2,CL:2,CO2:2,GLUCOSE:2,BUN:2,CREATININE:2,CALCIUM:2 in the last 72 hours  PT/INR:  Basename 09/26/11 0525  LABPROT 16.3*  INR 1.29   Urine Cx- E. coli  Assessment/Plan: S/P Procedure(s) (LRB): ENDARTERECTOMY CAROTID (Right) CORONARY ARTERY BYPASS GRAFTING (CABG) (N/A) AORTA -INNOMIATE BYPASS ()  CV- HTN. Currently on Lopressor 25 bid, Ramipril 10 bid, HCTZ 25 mg daily and Norvasc10 daily.  Will increase beta blocker and monitor.    LUE DVT- Continue Heparin/Coumadin.  E. Coli UTI- Will  need to change abx coverage for UTI and wound infection. D/W MD. Need to be cautious with abx, as pt on Diflucan and Coumadin.  Neuro- continue to follow status.    DM- back on all home meds at this point.  CBGs stable.  Continue CRPI/PT, reconditioning.    LOS: 17 days    COLLINS,GINA H 09/26/2011   will hold heparin to remove pacing wires then resume INR still 1.2 Still issues with family, inpatient rehab and insurance co I have seen and examined [name] and agree with the above assessment  and plan.  [name] MD Beeper [phone] Office [phone] 09/26/2011 12:02 PM

## 2011-09-26 NOTE — Progress Notes (Signed)
ANTICOAGULATION CONSULT NOTE - Follow Up Consult  Pharmacy Consult for Heparin Indication: DVT (LUE)  Assessment: 71 yo male s/p CABG and R CEA on 4/18 found to have a LUE DVT and heparin therapy was initiated 4/29. Heparin level of 0.18 is below goal - current rate is  1400 units/hr.  Coumadin per MD day#5/5 minimum overlap required for VTE. INR 1.29.  New diflucan started for thrush which may increase INR.  No bleeding reported. CBC stable.  Goal of Therapy:  Heparin level 0.3-0.7 units/ml   Plan:  1. Increase heparin to 1700 units/hr.  Check heparin level 6 hours later. 2. Coumadin per MD - dose increased to 7.5 mg po daily 3. Daily heparin level and CBC 4. Coumadin education completed 5/1 with family members Heide Guile, PharmD, [facility] Clinical Pharmacist Pager [phone]   09/26/2011 7:06 AM   Allergies  Allergen Reactions  . Amoxicillin-Pot Clavulanate Nausea Only    Patient Measurements: Height: 5' 10.87" (180 cm) (on 09/07/11) Weight: 282 lb 12.8 oz (128.277 kg) IBW/kg (Calculated) : 74.99  Heparin Dosing Weight: 105 kg  Vital Signs: Temp: 100 F (37.8 C) (05/04 0448) Temp src: Oral (05/04 0448) BP: 160/78 mmHg (05/04 0624) Pulse Rate: 76  (05/04 0448)  Labs:  Flo Shanks 09/26/11 0525 09/25/11 0555 09/24/11 0630 09/24/11 0550  HGB -- 9.3* -- 9.4*  HCT -- 29.9* -- 29.9*  PLT -- 418* -- 283  APTT -- -- -- --  LABPROT 16.3* 14.8 15.1 --  INR 1.29 1.14 1.17 --  HEPARINUNFRC 0.18* 0.44 0.58 --  CREATININE -- -- -- --  CKTOTAL -- -- -- --  CKMB -- -- -- --  TROPONINI -- -- -- --   Estimated Creatinine Clearance: 113.7 ml/min (by C-G formula based on Cr of 0.71).

## 2011-09-26 NOTE — Progress Notes (Signed)
Slowly improving neurological status. No acute cardiac

## 2011-09-26 NOTE — Progress Notes (Signed)
CARDIAC REHAB PHASE I   PRE:  Rate/Rhythm: 73 NSR  BP:  Supine:   Sitting: 140/62  Standing:    SaO2: 95 2L  MODE:  Ambulation: 350 ft   POST:  Rate/Rhythem: 88 NSR  BP:  Supine:   Sitting: 152/74  Standing:    SaO2: 93 2L   Patient ambulated x2 assist with RW. Patient took several rest breaks. VSS. Patient gait slow and steady. Will continue to follow. Patient back in recliner with call bell in reach.    BROWN, Karmyn Lowman L, MS, NASM, CES

## 2011-09-26 NOTE — Progress Notes (Addendum)
ANTICOAGULATION CONSULT NOTE - Follow Up Consult  Pharmacy Consult for Heparin Indication: DVT (LUE)  Assessment: 71 yo male s/p CABG and R CEA on 4/18 found to have a LUE DVT and heparin therapy was initiated 4/29. Heparin level is at goal - current rate is  1400 units/hr.  Coumadin per MD day#5/5 minimum overlap required for VTE. INR 1.29.  New diflucan started for thrush which may increase INR.  No bleeding reported. CBC stable.  Goal of Therapy:  Heparin level 0.3-0.7 units/ml   Plan:  1. Continue heparin at 1700 units / hr 2. Follow up AM labs  Thank you. Anette Guarneri, PharmD Clinical Pharmacist [phone] 09/26/2011 3:16 PM   Allergies  Allergen Reactions  . Amoxicillin-Pot Clavulanate Nausea Only    Patient Measurements: Height: 5' 10.87" (180 cm) (on 09/07/11) Weight: 282 lb 12.8 oz (128.277 kg) IBW/kg (Calculated) : 74.99  Heparin Dosing Weight: 105 kg  Vital Signs: Temp: 100 F (37.8 C) (05/04 0448) Temp src: Oral (05/04 0448) BP: 152/70 mmHg (05/04 1103) Pulse Rate: 67  (05/04 1103)  Labs:  Basename 09/26/11 1450 09/26/11 0525 09/25/11 0555 09/24/11 0630 09/24/11 0550  HGB -- -- 9.3* -- 9.4*  HCT -- -- 29.9* -- 29.9*  PLT -- -- 418* -- 283  APTT -- -- -- -- --  LABPROT -- 16.3* 14.8 15.1 --  INR -- 1.29 1.14 1.17 --  HEPARINUNFRC 0.35 0.18* 0.44 -- --  CREATININE -- -- -- -- --  CKTOTAL -- -- -- -- --  CKMB -- -- -- -- --  TROPONINI -- -- -- -- --   Estimated Creatinine Clearance: 113.7 ml/min (by C-G formula based on Cr of 0.71).

## 2011-09-27 LAB — GLUCOSE, CAPILLARY
Glucose-Capillary: 116 mg/dL — ABNORMAL HIGH (ref 70–99)
Glucose-Capillary: 141 mg/dL — ABNORMAL HIGH (ref 70–99)
Glucose-Capillary: 158 mg/dL — ABNORMAL HIGH (ref 70–99)

## 2011-09-27 LAB — HEPARIN LEVEL (UNFRACTIONATED): Heparin Unfractionated: 0.31 IU/mL (ref 0.30–0.70)

## 2011-09-27 LAB — PROTIME-INR
INR: 1.79 — ABNORMAL HIGH (ref 0.00–1.49)
Prothrombin Time: 21.1 seconds — ABNORMAL HIGH (ref 11.6–15.2)

## 2011-09-27 MED ORDER — WARFARIN SODIUM 5 MG PO TABS
5.0000 mg | ORAL_TABLET | Freq: Every day | ORAL | Status: DC
  Administered 2011-09-27 – 2011-09-28 (×2): 5 mg via ORAL
  Filled 2011-09-27 (×3): qty 1

## 2011-09-27 NOTE — Progress Notes (Signed)
09/27/2011 1100 Nursing note EPW d/c per orders and per protocol. Patient tolerated well. End intact. Frequent vital signs collected per protocol. Pt. Advised of bedrest x 1 hr post removal. Bed alarm active, wife at bedside. Call bell within reach. Will continue to monitor.  Elis Rawlinson, Arville Lime

## 2011-09-27 NOTE — Progress Notes (Signed)
09/27/2011 6:24 PM Nursing note Patient ambulated two times during shift with two person assist, rolling walker and on RA. Patient tolerated ambulation well, stopping once each walk for rest break.  Encouraged one more walk this evening. Will continue to monitor.  Antoninette Lerner, Arville Lime

## 2011-09-27 NOTE — Progress Notes (Signed)
ANTICOAGULATION CONSULT NOTE - Follow Up Consult  Pharmacy Consult for Heparin Indication: DVT (LUE)  Assessment: 71 yo male s/p CABG and R CEA on 4/18 found to have a LUE DVT and heparin therapy was initiated 4/29.   Heparin level therapeutic this AM, Heparin held this AM for removal of EPWs, to resume 4 hours later.  Coumadin per MD, dose decreased this AM to 5 mg daily (on both Diflucan and Cipro), INR=1.79  Goal of Therapy:  Heparin level 0.3-0.7 units/ml   Plan:  1. Continue heparin at 1700 units / hr 2.Check evening heparin level to ensure therapeutic  Thank you. Anette Guarneri, PharmD Clinical Pharmacist [phone] 09/27/2011 11:00 AM   Allergies  Allergen Reactions  . Amoxicillin-Pot Clavulanate Nausea Only    Patient Measurements: Height: 5' 10.87" (180 cm) (on 09/07/11) Weight: 282 lb 6.4 oz (128.096 kg) IBW/kg (Calculated) : 74.99  Heparin Dosing Weight: 105 kg  Vital Signs: Temp: 98.8 F (37.1 C) (05/05 0554) Temp src: Oral (05/05 0554) BP: 160/42 mmHg (05/05 0554) Pulse Rate: 66  (05/05 0554)  Labs:  Basename 09/27/11 0510 09/26/11 1450 09/26/11 0525 09/25/11 0555  HGB -- -- -- 9.3*  HCT -- -- -- 29.9*  PLT -- -- -- 418*  APTT -- -- -- --  LABPROT 21.1* -- 16.3* 14.8  INR 1.79* -- 1.29 1.14  HEPARINUNFRC 0.39 0.35 0.18* --  CREATININE -- -- -- --  CKTOTAL -- -- -- --  CKMB -- -- -- --  TROPONINI -- -- -- --   Estimated Creatinine Clearance: 113.6 ml/min (by C-G formula based on Cr of 0.71).

## 2011-09-27 NOTE — Progress Notes (Addendum)
                    [address]            ,[place] [id number]          [phone]     18 Days Post-Op Procedure(s) (LRB): ENDARTERECTOMY CAROTID (Right) CORONARY ARTERY BYPASS GRAFTING (CABG) (N/A) AORTA -INNOMIATE BYPASS ()  Subjective: OOB in chair.  More comfortable and better mobility this am.  Objective: Vital signs in last 24 hours: Patient Vitals for the past 24 hrs:  BP Temp Temp src Pulse Resp SpO2 Weight  09/27/11 0554 160/42 mmHg 98.8 F (37.1 C) Oral 66  19  90 % 128.096 kg (282 lb 6.4 oz)  09/26/11 2035 156/55 mmHg 99 F (37.2 C) Oral 69  18  95 % -  09/26/11 1545 111/60 mmHg 98.6 F (37 C) Oral 66  20  96 % -  09/26/11 1103 152/70 mmHg - - 67  - - -   Current Weight  09/27/11 128.096 kg (282 lb 6.4 oz)   Pre op weight 135.6 kg   Intake/Output from previous day: 05/04 0701 - 05/05 0700 In: 360 [P.O.:360] Out: 2600 [Urine:2600]  CBGs 131-130-117-164-116  PHYSICAL EXAM:  Heart: RRR Lungs: clear Wound: RLE with mild erythema, small amount serous drainage from thigh, superficial separation of thigh wound with granulation tissue Extremities: +LE edema, but appears improved from yesterday   Lab Results: CBC: Basename 09/25/11 0555  WBC 10.6*  HGB 9.3*  HCT 29.9*  PLT 418*   BMET: No results found for this basename: NA:2,K:2,CL:2,CO2:2,GLUCOSE:2,BUN:2,CREATININE:2,CALCIUM:2 in the last 72 hours  PT/INR:  Basename 09/27/11 0510  LABPROT 21.1*  INR 1.79*     Assessment/Plan: S/P Procedure(s) (LRB): ENDARTERECTOMY CAROTID (Right) CORONARY ARTERY BYPASS GRAFTING (CABG) (N/A) AORTA -INNOMIATE BYPASS () CV- BPs some better after meds adjusted. Continue to monitor. LUE DVT- Continue Heparin/Coumadin. Will need to decrease Coumadin since he is on Diflucan and Cipro.  INR bumped from 1.2-1.7. E. Coli UTI- D#2 Cipro.  Watch INR while on abx. Neuro- stable, returning to baseline. Continue to follow status.  DM- back on all home  meds at this point. CBGs stable.  R leg stable, decreased erythema/drainage. Continue CRPI/PT, reconditioning. Heparin stopped at 0500 -will d/c EPWs this am and resume Heparin in 4 hours.      LOS: 18 days    COLLINS,GINA H 09/27/2011   I have seen and examined [name] and agree with the above assessment  and plan.  [name] MD Beeper [phone] Office [phone] 09/27/2011 12:47 PM

## 2011-09-27 NOTE — Progress Notes (Signed)
ANTICOAGULATION CONSULT NOTE - Follow Up Consult  Pharmacy Consult for Heparin Indication: DVT (LUE)  Assessment: 71 yo male s/p CABG and R CEA on 4/18 found to have a LUE DVT and heparin therapy was initiated 4/29.   Heparin level therapeutic this AM, Heparin held this AM for removal of EPWs, to resume 4 hours later.  PM heparin level after resuming = 0.31  Coumadin per MD, dose decreased this AM to 5 mg daily (on both Diflucan and Cipro), INR=1.79  Goal of Therapy:  Heparin level 0.3-0.7 units/ml   Plan:  1. Continue heparin at 1700 units / hr 2.Check evening heparin level to ensure therapeutic  Thank you. Anette Guarneri, PharmD Clinical Pharmacist [phone] 09/27/2011 9:21 PM   Allergies  Allergen Reactions  . Amoxicillin-Pot Clavulanate Nausea Only    Patient Measurements: Height: 5' 10.87" (180 cm) (on 09/07/11) Weight: 282 lb 6.4 oz (128.096 kg) IBW/kg (Calculated) : 74.99  Heparin Dosing Weight: 105 kg  Vital Signs: Temp: 98.4 F (36.9 C) (05/05 2045) Temp src: Oral (05/05 2045) BP: 175/73 mmHg (05/05 2045) Pulse Rate: 75  (05/05 2045)  Labs:  Flo Shanks 09/27/11 2037 09/27/11 0510 09/26/11 1450 09/26/11 0525 09/25/11 0555  HGB -- -- -- -- 9.3*  HCT -- -- -- -- 29.9*  PLT -- -- -- -- 418*  APTT -- -- -- -- --  LABPROT -- 21.1* -- 16.3* 14.8  INR -- 1.79* -- 1.29 1.14  HEPARINUNFRC 0.31 0.39 0.35 -- --  CREATININE -- -- -- -- --  CKTOTAL -- -- -- -- --  CKMB -- -- -- -- --  TROPONINI -- -- -- -- --   Estimated Creatinine Clearance: 113.6 ml/min (by C-G formula based on Cr of 0.71).

## 2011-09-28 ENCOUNTER — Encounter: Payer: Self-pay | Admitting: Surgery

## 2011-09-28 LAB — CBC
HCT: 32.4 % — ABNORMAL LOW (ref 39.0–52.0)
Hemoglobin: 10 g/dL — ABNORMAL LOW (ref 13.0–17.0)
MCH: 27.8 pg (ref 26.0–34.0)
MCHC: 30.9 g/dL (ref 30.0–36.0)
MCV: 90 fL (ref 78.0–100.0)
Platelets: 429 10*3/uL — ABNORMAL HIGH (ref 150–400)
RBC: 3.6 MIL/uL — ABNORMAL LOW (ref 4.22–5.81)
RDW: 16.2 % — ABNORMAL HIGH (ref 11.5–15.5)
WBC: 7.7 10*3/uL (ref 4.0–10.5)

## 2011-09-28 LAB — PROTIME-INR
INR: 2.01 — ABNORMAL HIGH (ref 0.00–1.49)
Prothrombin Time: 23.1 seconds — ABNORMAL HIGH (ref 11.6–15.2)

## 2011-09-28 LAB — GLUCOSE, CAPILLARY
Glucose-Capillary: 85 mg/dL (ref 70–99)
Glucose-Capillary: 114 mg/dL — ABNORMAL HIGH (ref 70–99)

## 2011-09-28 LAB — HEPARIN LEVEL (UNFRACTIONATED): Heparin Unfractionated: 0.42 IU/mL (ref 0.30–0.70)

## 2011-09-28 LAB — BASIC METABOLIC PANEL
BUN: 12 mg/dL (ref 6–23)
CO2: 28 mEq/L (ref 19–32)
Calcium: 9.4 mg/dL (ref 8.4–10.5)
Chloride: 99 mEq/L (ref 96–112)
Creatinine, Ser: 0.82 mg/dL (ref 0.50–1.35)
GFR calc Af Amer: >90 mL/min (ref >90)
GFR calc non Af Amer: 86 mL/min — ABNORMAL LOW (ref >90)
Glucose, Bld: 180 mg/dL — ABNORMAL HIGH (ref 70–99)
Potassium: 3.8 mEq/L (ref 3.5–5.1)
Sodium: 137 mEq/L (ref 135–145)

## 2011-09-28 NOTE — Progress Notes (Signed)
CARDIAC REHAB PHASE I   PRE:  Rate/Rhythm: 73SR  BP:  Supine:   Sitting: 91/74  Standing:    SaO2: 96%RA  MODE:  Ambulation: 440 ft   POST:  Rate/Rhythem: 89SR  BP:  Supine:   Sitting: 126/85  Standing:    SaO2: 95%RA [id number] Pt needed encouragement to walk. C/o dizziness and leg cramps during walk. Several standing rest stops. Walked 442ft on RA with rolling walker and asst x 2. To recliner with chair alarm. RN in room. BP better with walk.  [name]

## 2011-09-28 NOTE — Progress Notes (Signed)
Nutrition Follow-up  Patient states his appetite is fair. PO intake variable at 10-75% per flowsheet records; does not like hospital food. Declining supplements.  Diet Order:  Carbohydrate Modified Medium Calorie  Meds: Scheduled Meds:   . amLODipine  10 mg Oral Daily  . aspirin EC  325 mg Oral Daily  . ciprofloxacin  500 mg Oral BID  . docusate sodium  200 mg Oral Daily  . fenofibrate  160 mg Oral Daily  . fluconazole  100 mg Oral Daily  . gabapentin  300 mg Oral BID  . glipiZIDE  10 mg Oral QAC breakfast  . hydrochlorothiazide  25 mg Oral Daily  . insulin aspart  0-24 Units Subcutaneous TID AC & HS  . metFORMIN  500 mg Oral BID WC  . metoprolol tartrate  50 mg Oral BID  . pantoprazole  40 mg Oral QAC breakfast  . pioglitazone  15 mg Oral Daily  . ramipril  10 mg Oral BID  . rosuvastatin  10 mg Oral q1800  . sodium chloride  3 mL Intravenous Q12H  . thiamine  100 mg Oral Daily  . warfarin  5 mg Oral q1800  . Warfarin - Physician Dosing Inpatient   Does not apply q1800   Continuous Infusions:   . heparin 1,700 Units/hr (09/27/11 1502)   PRN Meds:.sodium chloride, acetaminophen, bisacodyl, bisacodyl, magic mouthwash, ondansetron (ZOFRAN) IV, ondansetron, sodium chloride, traMADol, DISCONTD: labetalol  Labs:  CMP     Component Value Date/Time   NA 137 09/28/2011 0525   K 3.8 09/28/2011 0525   CL 99 09/28/2011 0525   CO2 28 09/28/2011 0525   GLUCOSE 180* 09/28/2011 0525   BUN 12 09/28/2011 0525   CREATININE 0.82 09/28/2011 0525   CALCIUM 9.4 09/28/2011 0525   PROT 6.5 09/07/2011 1647   ALBUMIN 3.2* 09/07/2011 1647   AST 14 09/07/2011 1647   ALT 16 09/07/2011 1647   ALKPHOS 56 09/07/2011 1647   BILITOT 0.2* 09/07/2011 1647   GFRNONAA 86* 09/28/2011 0525   GFRAA >90 09/28/2011 0525     Intake/Output Summary (Last 24 hours) at 09/28/11 1007 Last data filed at 09/28/11 [id number]  Gross per 24 hour  Intake    240 ml  Output   1350 ml  Net  -1110 ml    CBG (last 3)   Basename 09/28/11 0728  09/27/11 2048 09/27/11 1627  GLUCAP 172* 158* 141*    Weight Status:  126.6 kg (5/6) -- down likely due to diuresis  Re-estimated needs:  2300-2500 kcals, 115-125 gm protein  Nutrition Dx:  Inadequate Oral Intake, ongoing  Goal:  Oral intake to meet >90% of estimated nutrition needs, unmet Monitor: PO intake, weight, labs, I/O's  Intervention:    Recommend Carbohydrate Modified High Calorie diet  RD to follow for nutrition care plan  Lady Deutscher Pager #:  [phone]

## 2011-09-28 NOTE — Progress Notes (Signed)
ANTICOAGULATION CONSULT NOTE - Follow Up Consult  Pharmacy Consult for Heparin Indication: DVT (LUE)  Assessment: 71 yo male s/p CABG and R CEA on 4/18 found to have a LUE DVT and heparin therapy was initiated 4/29.   Heparin level therapeutic at 0.4, no bleeding complications noted. Consider continuing heparin 24h for INR>2 for 48 hours total.   Coumadin per MD, dose to continue at 5 mg daily (on both Diflucan and Cipro), INR=1.79>>2.01. Watch INR closely on offending abx  Goal of Therapy:  Heparin level 0.3-0.7 units/ml   Plan:  1. Continue heparin at 1700 units / hr 2.Continue to follow INR closely 3.Warfarin education has been completed.  Thank you. Erin Hearing PharmD Clinical Pharmacist [phone] 09/28/2011 8:45 AM    Labs:  [name] 09/28/11 0525 09/27/11 2037 09/27/11 0510 09/26/11 0525  HGB 10.0* -- -- --  HCT 32.4* -- -- --  PLT 429* -- -- --  APTT -- -- -- --  LABPROT 23.1* -- 21.1* 16.3*  INR 2.01* -- 1.79* 1.29  HEPARINUNFRC 0.42 0.31 0.39 --  CREATININE 0.82 -- -- --  CKTOTAL -- -- -- --  CKMB -- -- -- --  TROPONINI -- -- -- --   Estimated Creatinine Clearance: 110.1 ml/min (by C-G formula based on Cr of 0.82).

## 2011-09-28 NOTE — Progress Notes (Signed)
Pt ambulated approximately 400 feet x1 assist with walker. Stopped to rest a few times, will continue to monitor patient. Tristy Udovich, Bettina Gavia RN

## 2011-09-28 NOTE — Progress Notes (Addendum)
19 Days Post-Op Procedure(s) (LRB): ENDARTERECTOMY CAROTID (Right) CORONARY ARTERY BYPASS GRAFTING (CABG) (N/A) AORTA -INNOMIATE BYPASS () Subjective:  Mr. [name] remains confused this morning.  Patients ambulation has improved.  Objective: Vital signs in last 24 hours: Temp:  [98.3 F (36.8 C)-98.4 F (36.9 C)] 98.3 F (36.8 C) (05/06 0421) Pulse Rate:  [58-75] 73  (05/06 0421) Cardiac Rhythm:  [-] Normal sinus rhythm (05/06 0728) Resp:  [20-21] 20  (05/06 0421) BP: (102-175)/(42-73) 142/60 mmHg (05/06 0421) SpO2:  [89 %-95 %] 92 % (05/06 0421) Weight:  [279 lb 1.6 oz (126.6 kg)] 279 lb 1.6 oz (126.6 kg) (05/06 0421)  Intake/Output from previous day: 05/05 0701 - 05/06 0700 In: 240 [P.O.:240] Out: 950 [Urine:950]   General appearance: alert, cooperative and no distress Heart: regular rate and rhythm Lungs: clear to auscultation bilaterally Abdomen: soft, non-tender; bowel sounds normal; no masses,  no organomegaly Extremities: edema 1+ bilateral LE Wound: RLE wound some staples remain in place, minimal erythema present, small opening present in middle of right thigh incision, wound appears clean  Lab Results:  [facility] 09/28/11 0525  WBC 7.7  HGB 10.0*  HCT 32.4*  PLT 429*   BMET:  Basename 09/28/11 0525  NA 137  K 3.8  CL 99  CO2 28  GLUCOSE 180*  BUN 12  CREATININE 0.82  CALCIUM 9.4    PT/INR:  Basename 09/28/11 0525  LABPROT 23.1*  INR 2.01*   ABG    Component Value Date/Time   PHART 7.414 09/11/2011 1231   HCO3 23.9 09/11/2011 1231   TCO2 25 09/11/2011 1231   ACIDBASEDEF 1.0 09/11/2011 1231   O2SAT 91.0 09/11/2011 1231   CBG (last 3)   Basename 09/28/11 0728 09/27/11 2048 09/27/11 1627  GLUCAP 172* 158* 141*    Assessment/Plan: S/P Procedure(s) (LRB): ENDARTERECTOMY CAROTID (Right) CORONARY ARTERY BYPASS GRAFTING (CABG) (N/A) AORTA -INNOMIATE BYPASS ()  1. CV- NSR, blood pressure better controlled, on Norvasc, Lopressor, Altace 2. LUE  DVT- INR therapeutic at 2.01, can d/c Heparin and will continue Coumadin 5mg  daily, will need to closely monitor due to administration of antibiotics 3. UTI- + E. Coli, Continue Cipro Day #3/7 4. Neuro- stable, patient continues to have confusion, but improved since initial onset, Neurology following 5. DM- CBGs controlled, currently on home regimen 6. RLE Cellulitis- improving, small opening in upper thigh incision wound is clean 7. Deconditioning- patient will need SNF at d/c 8. Dispo- patient continues to improve, family was supposed to visit SNF this weekend and decide which they felt appropriate   LOS: 19 days    BARRETT, ERIN 09/28/2011    Chart reviewed, patient examined, agree with above. He has therapeutic INR.  Will continue heparin until tomorrow am. I removed the remainder of leg staples.  There is small amount of serous drainage from the open area in thigh but overall it looks good.  He should be ready for discharge in the next couple days.  His wife is considering taking him home with home health PT and nursing.

## 2011-09-29 DIAGNOSIS — I82622 Acute embolism and thrombosis of deep veins of left upper extremity: Secondary | ICD-10-CM

## 2011-09-29 DIAGNOSIS — Z951 Presence of aortocoronary bypass graft: Secondary | ICD-10-CM

## 2011-09-29 DIAGNOSIS — Z9889 Other specified postprocedural states: Secondary | ICD-10-CM

## 2011-09-29 LAB — HEPARIN LEVEL (UNFRACTIONATED): Heparin Unfractionated: 0.53 IU/mL (ref 0.30–0.70)

## 2011-09-29 MED ORDER — WARFARIN SODIUM 5 MG PO TABS: 5.0000 mg | ORAL_TABLET | Freq: Every day | ORAL | Status: DC

## 2011-09-29 MED ORDER — AMLODIPINE BESYLATE 10 MG PO TABS: 10.0000 mg | ORAL_TABLET | Freq: Every day | ORAL | Status: DC

## 2011-09-29 MED ORDER — CIPROFLOXACIN HCL 500 MG PO TABS: 500.0000 mg | ORAL_TABLET | Freq: Two times a day (BID) | ORAL | Status: AC

## 2011-09-29 MED ORDER — FLUCONAZOLE 100 MG PO TABS: 100.0000 mg | ORAL_TABLET | Freq: Every day | ORAL | Status: AC

## 2011-09-29 MED ORDER — WARFARIN SODIUM 2.5 MG PO TABS
2.5000 mg | ORAL_TABLET | Freq: Every day | ORAL | Status: DC
  Filled 2011-09-29: qty 1

## 2011-09-29 MED ORDER — TRAMADOL HCL 50 MG PO TABS: 50.0000 mg | ORAL_TABLET | ORAL | Status: AC | PRN

## 2011-09-29 MED ORDER — METOPROLOL TARTRATE 50 MG PO TABS: 50.0000 mg | ORAL_TABLET | Freq: Two times a day (BID) | ORAL | Status: DC

## 2011-09-29 MED ORDER — WARFARIN SODIUM 2.5 MG PO TABS: 2.5000 mg | ORAL_TABLET | Freq: Every day | ORAL | Status: DC

## 2011-09-29 NOTE — Progress Notes (Signed)
CSW and RNCM met with pt and pt wife at bedside to discuss disposition. This family has had a difficult time agreeing on a plan for numerous reasons, but have decided at this time to take pt home at time of d/c with home health services. Pt wife is hopeful that familiar surroundings will support resolution of pt delirium. CSW and RNCM validated concerns and provided emotional support to pt wife. CSW explained that pt wife could still exercise option for SNF if having pt at home proved too difficult in the coming weeks.  No other social work needs identified at this time, [facility] signing off.  Jerral Ralph, MSW [phone]

## 2011-09-29 NOTE — Discharge Summary (Signed)
Physician Discharge Summary  Patient ID: [name] MRN: [id number] DOB/AGE: [birth year] 72 y.o.  Admit date: 09/09/2011 Discharge date: 09/29/2011  Admission Diagnoses:  Patient Active Problem List  Diagnoses  . Diabetes mellitus  . Occlusion and stenosis of carotid artery without mention of cerebral infarction  . DVT of upper extremity (deep vein thrombosis), left  . S/P CABG (coronary artery bypass graft)  . H/O carotid endarterectomy     Discharge Diagnoses:   Patient Active Problem List  Diagnoses  . Diabetes mellitus  . Occlusion and stenosis of carotid artery without mention of cerebral infarction  . DVT of upper extremity (deep vein thrombosis), left  . S/P CABG (coronary artery bypass graft)  . H/O carotid endarterectomy    Discharged Condition: good  History of Present Illness:   [name] is a 71 yo white male with known history of aortoiliac and peripheral vascular disease, and carotid occlusive disease S/P Left Carotid Endarterectomy.  His original workup also revealed high grade right internal carotid stenosis.  The patient has been followed by Dr. Oneida Alar since his original procedure.  In January the patient felt that he was ready to undergo endarterectomy of his Right Carotid.  Preoperative evaluation was performed and on the patients cardiac catheterization he was found to have significant 3 vessel CAD.  Due to these findings he was referred to Cardiothoracic surgery for possible bypass surgery.  Dr. Cyndia Bent evaluated the patient on 08/12/2011 and felt that he wound require bypass surgery.  The risks and benefits of the procedure were explained to the patient and his wife and he was agreeable to proceed.  After speaking with Dr. Oneida Alar the patient set up for a joint procedure scheduled for 09/09/2011.  Hospital Course:   On 09/09/2011 the patient presented to [facility] and underwent Right sided Carotid Endarterectomy, Innominate artery bypass, CABG x3  utilizing LIMA to LAD, SVG to Cx, and SVG to PDA, and endoscopic vein harvest from right leg.  The patient tolerated the procedure and was taken to the ICU intubated and in stable condition.  POD #0 the patient remained on pressors.  POD #1 patient received 2 units of PRBCs for blood loss anemia.  He remained on levophed and dopamine.  The patient developed transient atrial fibrillation, he was treated with digoxin.  The patients right sided neck JP drain was removed.  POD #2 the patient's chest tubes and lines were removed.  He was extubated without difficulty.  POD #3 the patient remained on Dopamine.  The patient was evaluated by PT who feels who would benefit from additional rehab at [facility].  POD #5 the patient developed issues with hypertension and was treated with Labetolol IV.  The patient developed agitation and confusion.  The patient was transferred to the step down unit in stable condition.  POD #6 the patient's confusion and agitation worsened.  He continued to have issues with hypertension requiring Hydralazine and NTG drip.  He was transferred back to the ICU.  POD #7 the patients agitation continued.  He required prn Haldol and remained sedated and somnolent throughout the day.  The patient also underwent CT scan of the head which did not reveal any acute changes.  POD #8 the patient required one unit of PRBCs for post operative anemia.  He continued to have issues with HTN, his Norvasc and Altace were increased.  POD #9 the patient continued to be confused, however began to improve.  POD #11 the patient developed LUE edema.  POD #12 the patients confusion was much improved.  His LUE was evaluated duplex studied confirmed the presence of a DVT in the left internal jugular vein, subclavian vein and axillary vein.  He was subsequently placed on Heparin.  He was transferred to step down in stable condition.  POD #13  The patient was febrile on evaluation.  RLE incisions appeared to be cellulitic.  He was  placed on Keflex and some of his staples were removed.  He was started on coumadin for his DVT.  The patient was evaluated by Physical medicine who felt he would do well with inpatient rehab.  POD #14 the patients urine was foul smelling and was sent for UA.  It was positive for UTI and cutlure report E. Coli.  He was placed on Ciprofloxacin.  Neurology was consulted to evaluate continued confusion.  Workup did not reveal any acute issues.  MRI was recommended, however due to recent surgery would recommend postponement of evaluation at this time.  POD #16 the patient was found to have thrush and was placed on Diflucan.  The remaining staples in his RLE incisions were removed.  POD #20 the patient continues to improve.  He does still have some mild confusion.  His INR is 2.41 this morning.  He will require 6 months of anticoagulation.  He will be discharged home on 2.5mg .  He remains on Ciprofloxacin for treatment of UTI Day 4/7.  He will remain on Diflucan for his oral thrush and will require 14 days of therapy.  He is to follow up with Dr. Oneida Alar office in 2 weeks and we will see him in Dr. Vivi Martens office in 3 weeks.    Disposition: 01-Home or Self Care  Discharge Orders    Future Appointments: Provider: Department: Dept Phone: Center:   10/08/2011 1:45 PM Elam Dutch, MD [facility] [phone] [facility]   10/20/2011 11:00 AM Gaye Pollack, MD [facility] [phone] [facility]     Medication List  As of 09/29/2011  8:57 AM   STOP taking these medications         nadolol 80 MG tablet      nitroGLYCERIN 0.4 MG SL tablet         TAKE these medications         amLODipine 10 MG tablet   Commonly known as: NORVASC   Take 1 tablet (10 mg total) by mouth daily.      aspirin 325 MG EC tablet   Take 325 mg by mouth daily.      atorvastatin 20 MG tablet   Commonly known as: LIPITOR   Take 20 mg by mouth daily.      b complex vitamins capsule   Take 1 capsule by mouth daily.      cilostazol  100 MG tablet   Commonly known as: PLETAL   Take 100 mg by mouth 2 (two) times daily.      ciprofloxacin 500 MG tablet   Commonly known as: CIPRO   Take 1 tablet (500 mg total) by mouth 2 (two) times daily.      doxycycline 100 MG tablet   Commonly known as: VIBRA-TABS   Take 100 mg by mouth daily.      fenofibrate 145 MG tablet   Commonly known as: TRICOR   Take 145 mg by mouth daily.      fluconazole 100 MG tablet   Commonly known as: DIFLUCAN   Take 1 tablet (100 mg total) by mouth daily.  gabapentin 300 MG capsule   Commonly known as: NEURONTIN   Take 300 mg by mouth 2 (two) times daily.      glipiZIDE 10 MG tablet   Commonly known as: GLUCOTROL   Take 10 mg by mouth daily.      IMODIUM PO   Take 1 tablet by mouth daily as needed. For diarrhea      metFORMIN 500 MG tablet   Commonly known as: GLUCOPHAGE   Take 500 mg by mouth 2 (two) times daily with a meal.      metoprolol 50 MG tablet   Commonly known as: LOPRESSOR   Take 1 tablet (50 mg total) by mouth 2 (two) times daily.      pioglitazone 30 MG tablet   Commonly known as: ACTOS   Take 15 mg by mouth daily.      pseudoephedrine-guaifenesin 60-600 MG per tablet   Commonly known as: MUCINEX D   Take 1 tablet by mouth as needed. Congestion      ramipril 10 MG tablet   Commonly known as: ALTACE   Take 10 mg by mouth 2 (two) times daily.      temazepam 15 MG capsule   Commonly known as: RESTORIL   Take 15 mg by mouth at bedtime as needed. For sleep      traMADol 50 MG tablet   Commonly known as: ULTRAM   Take 1-2 tablets (50-100 mg total) by mouth every 4 (four) hours as needed.      warfarin 2.5 MG tablet   Commonly known as: COUMADIN   Take 1 tablet (2.5 mg total) by mouth daily at 6 PM.      ZYRTEC ALLERGY PO   Take 1 tablet by mouth daily as needed. For allergies           Follow-up Information    Follow up with Ruta Hinds in 2 weeks. (office will arrange )       Follow up with  Gaye Pollack, MD on 10/20/2011. (Appointment is at 11:00AM)    Contact information:   [place] [phone]       Follow up with [place] on 10/20/2011. (Please get chest xray performed 1 hour prior to appointment with Dr. Cyndia Bent)    Contact information:   [address], [address]      Follow up with Home Health. (Please draw PT/INR on Thursday 10/01/2011, please call  [facility] with result [phone] Attn: Jolene)          Signed: Ellwood Handler 09/29/2011, 8:57 AM

## 2011-09-29 NOTE — Progress Notes (Signed)
[id number] Cardiac Rehab Completed discharge education with pt and wife. Pt and wife agrees to [facility]. CRP in [place], will send referral.

## 2011-09-29 NOTE — Progress Notes (Addendum)
20 Days Post-Op Procedure(s) (LRB): ENDARTERECTOMY CAROTID (Right) CORONARY ARTERY BYPASS GRAFTING (CABG) (N/A) AORTA -INNOMIATE BYPASS () Subjective:  [name] has no new complaints this morning.  Patient was originally planning on rehab, however patients wife states she may prefer to take him home.  Objective: Vital signs in last 24 hours: Temp:  [98.2 F (36.8 C)-98.7 F (37.1 C)] 98.7 F (37.1 C) (05/07 0611) Pulse Rate:  [62-68] 64  (05/07 0611) Cardiac Rhythm:  [-] Normal sinus rhythm (05/06 2045) Resp:  [18-20] 19  (05/07 0611) BP: (101-160)/(52-70) 101/64 mmHg (05/07 0611) SpO2:  [91 %-93 %] 93 % (05/07 0611) Weight:  [276 lb 7.3 oz (125.4 kg)] 276 lb 7.3 oz (125.4 kg) (05/07 [id number])  Intake/Output from previous day: 05/06 0701 - 05/07 0700 In: 1908.3 [P.O.:720; I.V.:1188.3] Out: 400 [Urine:400]    General appearance: alert, cooperative and no distress Heart: regular rate and rhythm Lungs: clear to auscultation bilaterally Abdomen: soft, non-tender; bowel sounds normal; no masses,  no organomegaly Extremities: edema 1+ Wound: multiple incisions on RLE, upper thigh wound has small opening with some drainage, sternotomy C/D/I  Lab Results:  Basename 09/28/11 0525  WBC 7.7  HGB 10.0*  HCT 32.4*  PLT 429*   BMET:  Basename 09/28/11 0525  NA 137  K 3.8  CL 99  CO2 28  GLUCOSE 180*  BUN 12  CREATININE 0.82  CALCIUM 9.4    PT/INR:  Basename 09/29/11 0515  LABPROT 26.6*  INR 2.41*   ABG    Component Value Date/Time   PHART 7.414 09/11/2011 1231   HCO3 23.9 09/11/2011 1231   TCO2 25 09/11/2011 1231   ACIDBASEDEF 1.0 09/11/2011 1231   O2SAT 91.0 09/11/2011 1231   CBG (last 3)   Basename 09/29/11 0617 09/28/11 2046 09/28/11 1641  GLUCAP 125* 114* 85    Assessment/Plan: S/P Procedure(s) (LRB): ENDARTERECTOMY CAROTID (Right) CORONARY ARTERY BYPASS GRAFTING (CABG) (N/A) AORTA -INNOMIATE BYPASS ()  CV- NSR, pressure better controlled, will continue  Norvasc, Lopressor, Altace LUE DVT- INR 2.41, will d/c Heparin, continue Coumadin UTI- + E.Coli, on Cipro Day 4/7 Neuro- stable DM- CBGS well controlled RLE Cellulitis- improving Deconditioning- PT/OT recommends SNF, however patients wife states may want to take him home Dispo- will d/c Heparin, patient continues to improve, awaiting family's decision on taking patient home of having him go to SNF, will plan for d/c once appropriate decisions have been made   LOS: 20 days    [name], [name] 09/29/2011    Chart reviewed, patient examined, agree with above. His INR is therapeutic and rising.  DC heparin. I think he should be on 2.5 mg coumadin at discharge since he is on cipro and diflucan. His INR will require outpatient followup. HHRN can draw INR Thursday with result to [facility] in our office. He and wife have decided to go home so I think he can go today.  He will need HHRN to change dressing over leg incisions daily until healed.  He should stay on cipro for 7 days total and diflucan for 14 days.  I want to see him in the office in 2 weeks with cxr.

## 2011-09-29 NOTE — Progress Notes (Signed)
CSW arranged for [facility] to transport pt home, per request of pt wife.  Jerral Ralph, MSW [phone]

## 2011-09-29 NOTE — Progress Notes (Signed)
09/29/2011 12:37 PM Nursing note Discharge avs form, rx, medications already taken today and those due this evening given and explained to pt. And wife. F/u appointments and when to call the doctor reviewed. Coumadin education provided. Dressing change for rt leg education provided to wife. Pt. And family viewed [place] #[phone]. All restrictions and incision care reviewed. D/c iv. D/c tele. D/c home via ambulance per orders.  Hailly Fess, Arville Lime

## 2011-09-29 NOTE — Progress Notes (Addendum)
VASCULAR AND VEIN SURGERY POST - OP CEA PROGRESS NOTE  Date of Surgery: 09/09/2011 Surgeon: Surgeon(s): Gaye Pollack, MD Elam Dutch, MD 20 Days Post-Op right Carotid Endarterectomy .  HPI: [name] is a 71 y.o. male who is 20 Days Post-Op right Carotid Endarterectomy . Patient is doing well. Pre-operative symptoms are Improved Patient denies headache; Patient denies difficulty swallowing; denies weakness in upper or lower extremities; Pt. denies other symptoms of stroke or TIA.  IMAGING: No results found.  Significant Diagnostic Studies: CBC Lab Results  Component Value Date   WBC 7.7 09/28/2011   HGB 10.0* 09/28/2011   HCT 32.4* 09/28/2011   MCV 90.0 09/28/2011   PLT 429* 09/28/2011    BMET    Component Value Date/Time   NA 137 09/28/2011 0525   K 3.8 09/28/2011 0525   CL 99 09/28/2011 0525   CO2 28 09/28/2011 0525   GLUCOSE 180* 09/28/2011 0525   BUN 12 09/28/2011 0525   CREATININE 0.82 09/28/2011 0525   CALCIUM 9.4 09/28/2011 0525   GFRNONAA 86* 09/28/2011 0525   GFRAA >90 09/28/2011 0525    COAG Lab Results  Component Value Date   INR 2.41* 09/29/2011   INR 2.01* 09/28/2011   INR 1.79* 09/27/2011   No results found for this basename: PTT      Intake/Output Summary (Last 24 hours) at 09/29/11 0716 Last data filed at 09/28/11 1700  Gross per 24 hour  Intake 1908.3 ml  Output    400 ml  Net 1508.3 ml    Physical Exam:  BP Readings from Last 3 Encounters:  09/29/11 101/64  09/29/11 101/64  09/07/11 162/59   Temp Readings from Last 3 Encounters:  09/29/11 98.7 F (37.1 C) Oral  09/29/11 98.7 F (37.1 C) Oral  09/07/11 97.7 F (36.5 C) Oral   SpO2 Readings from Last 3 Encounters:  09/29/11 93%  09/29/11 93%  09/07/11 96%   Pulse Readings from Last 3 Encounters:  09/29/11 64  09/29/11 64  09/07/11 57    Pt is A&O x 3 Gait is improving Speech is normal right Neck Wound is healing well Patient with Negative tongue deviation and Negative facial  droop Pt has good and equal strength in all extremities Left upper extremity with edema, non painful and bilateral upper palpable pulse equal Assessment: [name] is a 71 y.o. male is S/P Right Carotid endarterectomy Pt is voiding, ambulating and taking po well   Plan: Discharge to: Skilled nursing facility Follow-up in 4 weeks   [place], [place] 09/29/2011 7:16 AM   INR 2.4 Slowly improving overall Needs coumadin for 6 months Will arrange follow up with me as outpt  Ruta Hinds, MD [facility]: [phone] Pager: [phone]

## 2011-09-29 NOTE — Discharge Instructions (Signed)
Coronary Artery Bypass Grafting Care After Refer to this sheet in the next few weeks. These instructions provide you with information on caring for yourself after your procedure. Your caregiver may also give you more specific instructions. Your treatment has been planned according to current medical practices, but problems sometimes occur. Call your caregiver if you have any problems or questions after your procedure.  Recovery from open heart surgery will be different for everyone. Some people feel well after 3 or 4 weeks, while for others it takes longer. After heart surgery, it may be normal to:  Not have an appetite, feel nauseated by the smell of food, or only want to eat a small amount.   Be constipated because of changes in your diet, activity, and medicines. Eat foods high in fiber. Add fresh fruits and vegetables to your diet. Stool softeners may be helpful.   Feel sad or unhappy. You may be frustrated or cranky. You may have good days and bad days. Do not give up. Talk to your caregiver if you do not feel better.   Feel weakness and fatigue. You many need physical therapy or cardiac rehabilitation to get your strength back.   Develop an irregular heartbeat called atrial fibrillation. Symptoms of atrial fibrillation are a fast, irregular heartbeat or feelings of fluttery heartbeats, shortness of breath, low blood pressure, and dizziness. If these symptoms develop, see your caregiver right away.  MEDICATION  Have a list of all the medicines you will be taking when you leave the hospital. For every medicine, know the following:   Name.   Exact dose.   Time of day to be taken.   How often it should be taken.   Why you are taking it.   Ask which medicines should or should not be taken together. If you take more than one heart medicine, ask if it is okay to take them together. Some heart medicines should not be taken at the same time because they may lower your blood pressure too  much.   Narcotic pain medicine can cause constipation. Eat fresh fruits and vegetables. Add fiber to your diet. Stool softener medicine may help relieve constipation.   Keep a copy of your medicines with you at all times.   Do not add or stop taking any medicine until you check with your caregiver.   Medicines can have side effects. Call your caregiver who prescribed the medicine if you:   Start throwing up, have diarrhea, or have stomach pain.   Feel dizzy or lightheaded when you stand up.   Feel your heart is skipping beats or is beating too fast or too slow.   Develop a rash.   Notice unusual bruising or bleeding.  HOME CARE INSTRUCTIONS  After heart surgery, it is important to learn how to take your pulse. Have your caregiver show you how to take your pulse.   Use your incentive spirometer. Ask your caregiver how long after surgery you need to use it.  Care of your chest incision  Tell your caregiver right away if you notice clicking in your chest (sternum).   Support your chest with a pillow or your arms when you take deep breaths and cough.   Follow your caregiver's instructions about when you can bathe or swim.   Protect your incision from sunlight during the first year to keep the scar from getting dark.   Tell your caregiver if you notice:   Increased tenderness of your incision.   Increased redness  or swelling around your incision.   Drainage or pus from your incision.  Care of your leg incision(s)  Avoid crossing your legs.   Avoid sitting for long periods of time. Change positions every half hour.   Elevate your leg(s) when you are sitting.   Check your leg(s) daily for swelling. Check the incisions for redness or drainage.   Wear your elastic stockings as told by your caregiver. Take them off at bedtime.  Diet  Diet is very important to heart health.   Eat plenty of fresh fruits and vegetables. Meats should be lean cut. Avoid canned, processed, and  fried foods.   Talk to a dietician. They can teach you how to make healthy food and drink choices.  Weight  Weigh yourself every day. This is important because it helps to know if you are retaining fluid that may make your heart and lungs work harder.   Use the same scale each time.   Weigh yourself every morning at the same time. You should do this after you go to the bathroom, but before you eat breakfast.   Your weight will be more accurate if you do not wear any clothes.   Record your weight.   Tell your caregiver if you have gained 2 pounds or more overnight.  Activity Stop any activity at once if you have chest pain, shortness of breath, irregular heartbeats, or dizziness. Get help right away if you have any of these symptoms.  Bathing.  Avoid soaking in a bath or hot tub until your incisions are healed.   Rest. You need a balance of rest and activity.   Exercise. Exercise per your caregiver's advice. You may need physical therapy or cardiac rehabilitation to help strengthen your muscles and build your endurance.   Climbing stairs. Unless your caregiver tells you not to climb stairs, go up stairs slowly and rest if you tire. Do not pull yourself up by the handrail.   Driving a car. Follow your caregiver's advice on when you may drive. You may ride as a passenger at any time. When traveling for long periods of time in a car, get out of the car and walk around for a few minutes every 2 hours.   Lifting. Avoid lifting, pushing, or pulling anything heavier than 10 pounds for 6 weeks after surgery or as told by your caregiver.   Returning to work. Check with your caregiver. People heal at different rates. Most people will be able to go back to work 6 to 12 weeks after surgery.   Sexual activity. You may resume sexual relations as told by your caregiver.  SEEK MEDICAL CARE IF:  Any of your incisions are red, painful, or have any type of drainage coming from them.   You have an  oral temperature above 102 F (38.9 C).   You have ankle or leg swelling.   You have pain in your legs.   You have weight gain of 2 or more pounds a day.   You feel dizzy or lightheaded when you stand up.  SEEK IMMEDIATE MEDICAL CARE IF:  You have angina or chest pain that goes to your jaw or arms. Call your local emergency services right away.   You have shortness of breath at rest or with activity.   You have a fast or irregular heartbeat (arrhythmia).   There is a "clicking" in your sternum when you move.   You have numbness or weakness in your arms or legs.  MAKE SURE YOU:  Understand these instructions.   Will watch your condition.   Will get help right away if you are not doing well or get worse.  Document Released: 11/28/2004 Document Revised: 04/30/2011 Document Reviewed: 07/16/2010 [facility] Patient Information 2012 [place].  Endoscopic Saphenous Vein Harvesting Care After Refer to this sheet in the next few weeks. These instructions provide you with information on caring for yourself after your procedure. Your caregiver may also give you more specific instructions. Your treatment has been planned according to current medical practices, but problems sometimes occur. Call your caregiver if you have any problems or questions after your procedure. HOME CARE INSTRUCTIONS Medicine  Take whatever pain medicine your surgeon prescribes. Follow the directions carefully. Do not take over-the-counter pain medicine unless your surgeon says it is okay. Some pain medicine can cause bleeding problems for several weeks after surgery.   Follow your surgeon's instructions about driving. You will probably not be permitted to drive after heart surgery.   Take any medicines your surgeon prescribes. Any medicines you took before your heart surgery should be checked with your caregiver before you start taking them again.  Wound care  Ask your surgeon how long you should keep  wearing your elastic bandage or stocking.   Check the area around your surgical cuts (incisions) whenever your bandages (dressings) are changed. Look for any redness or swelling.   You will need to return to have the stitches (sutures) or staples taken out. Ask your surgeon when to do that.   Ask your surgeon when you can shower or bathe.  Activity  Try to keep your legs raised when you are sitting.   Do any exercises your caregivers have given you. These may include deep breathing exercises, coughing, walking, or other exercises.  SEEK MEDICAL CARE IF:  You have any questions about your medicines.   You have more leg pain, especially if your pain medicine stops working.   New or growing bruises develop on your leg.   Your leg swells, feels tight, or becomes red.   You have numbness in your leg.  SEEK IMMEDIATE MEDICAL CARE IF:  Your pain gets much worse.   Blood or fluid leaks from any of the incisions.   Your incisions become warm, swollen, or red.   You have chest pain.   You have trouble breathing.   You have a fever.   You have more pain near your leg incision.  MAKE SURE YOU:  Understand these instructions.   Will watch your condition.   Will get help right away if you are not doing well or get worse.  Document Released: 01/21/2011 Document Revised: 04/30/2011 Document Reviewed: 01/21/2011 [facility] Patient Information 2012 [place].  Deep Vein Thrombosis A deep vein thrombosis (DVT) is a blood clot (thrombus) that develops in a deep vein. A DVT is a clot in the deep, larger veins of the leg, arm, or pelvis. These are more dangerous than clots that might form in veins on the surface of the body. Deep vein thrombosis can lead to complications if the clot breaks off and travels in the bloodstream to the lungs. CAUSES Blood clots form in a vein for different reasons. Usually several things cause blood clots. They include:  The flow of blood slows down.     The inside of the vein is damaged in some way.   The person has a condition that makes blood clot more easily. These conditions may include:   Older  age (especially over 48 years old).   Having a history of blood clots.   Having major or lengthy surgery. Hip surgery is particularly high-risk.   Breaking a hip or leg.   Sitting or lying still for a long time.   Cancer or cancer treatment.   Having a long, thin tube (catheter) placed inside a vein during a medical procedure.   Being overweight (obese).   Pregnancy and childbirth.   Medicines with estrogen.   Smoking.   Other circulation or heart problems.  SYMPTOMS When a clot forms, it can either partially or totally block the blood flow in that vein. Symptoms of a DVT can include:  Swelling of the leg or arm, especially if one side is much worse.   Warmth and redness of the leg or arm, especially if one side is much worse.   Pain in an arm or leg. If the clot is in the leg, symptoms may be more noticeable or worse when standing or walking.  If the blood clot travels to the lung, it may cause:  Shortness of breath.   Chest pain. The pain may be worsened by deep breaths.   Coughing up thick mucus (phlegm), possibly flecked with blood.  Anyone with these symptoms should get emergency medical treatment right away. Call your local emergency services (911 in [place] if you have these symptoms. DIAGNOSIS If a DVT is suspected, your caregiver will take a full medical history. He or she will also perform a physical exam. Tests that also may be required include:  Studies of the clotting properties of the blood.   An ultrasound scan.   X-rays to show the flow of blood when special dye is injected into the veins (venography).   Studies of your lungs if you have any chest symptoms.  PREVENTION  Exercise the legs regularly. Take a brisk 30 minute walk every day.   Maintain a weight that is appropriate for your height.    Avoid sitting or lying in bed for long periods of time without moving your legs.   Women, particularly those over the age of 1, should consider the risks and benefits of taking estrogen medicines, including birth control pills.   Do not smoke, especially if you take estrogen medicines.   Long-distance travel can increase your risk. You should exercise your legs by walking or pumping the muscles every hour.   In hospital prevention:   Prevention may include medical and nonmedical measures.  TREATMENT  The most common treatment for DVT is blood thinning (anticoagulant) medicine, which reduces the blood's tendency to clot. Anticoagulants can stop new blood clots from forming and old ones from growing. They cannot dissolve existing clots. Your body does this by itself over time. Anticoagulants can be given by mouth, by intravenous (IV) access, or by injection. Your caregiver will determine the best program for you.   Less commonly, clot-dissolving drugs (thrombolytics) are used to dissolve a DVT. They carry a high risk of bleeding, so they are used mainly in severe cases.   Very rarely, a blood clot in the leg needs to be removed surgically.   If you are unable to take anticoagulants, your caregiver may arrange for you to have a filter placed in a main vein in your belly (abdomen). This filter prevents clots from traveling to your lungs.  HOME CARE INSTRUCTIONS  Take all medicines prescribed by your caregiver. Follow the directions carefully.   You will most likely continue taking anticoagulants after you  leave the hospital. Your caregiver will advise you on the length of treatment (usually 3 to 6 months, sometimes for life).   Taking too much or too little of an anticoagulant is dangerous. While taking this type of medicine, you will need to have regular blood tests to be sure the dose is correct. The dose can change for many reasons. It is critically important that you take this medicine  exactly as prescribed, and that you have blood tests exactly as directed.   Many foods can interfere with anticoagulants. These include foods high in vitamin K, such as spinach, kale, broccoli, cabbage, collard and turnip greens, Brussels sprouts, peas, cauliflower, seaweed, parsley, beef and pork liver, green tea, and soybean oil. Your caregiver should discuss limits on these foods with you or you should arrange a visit with a dietician to answer your questions.   Many medicines can interfere with anticoagulants. You must tell your caregiver about any and all medicines you take. This includes all vitamins and supplements. Be especially cautious with aspirin and anti-inflammatory medicines. Ask your caregiver before taking these.   Anticoagulants can have side effects, mostly excessive bruising or bleeding. You will need to hold pressure over cuts for longer than usual. Avoid alcoholic drinks or consume only very small amounts while taking this medicine.   If you are taking an anticoagulant:   Wear a medical alert bracelet.   Notify your dentist or other caregivers before procedures.   Avoid contact sports.   Ask your caregiver how soon you can go back to normal activities. Not being active can lead to new clots. Ask for a list of what you should and should not do.   Exercise your lower leg muscles. This is important while traveling.   You may need to wear compression stockings. These are tight elastic stockings that apply pressure to the lower legs. This can help keep the blood in the legs from clotting.   If you are a smoker, you should quit.   Learn as much as you can about DVT.  SEEK MEDICAL CARE IF:  You have unusual bruising or any bleeding problems.   The swelling or pain in your affected arm or leg is not gradually improving.   You anticipate surgery or long-distance travel. You should get specific advice on DVT prevention.   You discover other family members with blood  clots. This may require further testing for inherited diseases or conditions.  SEEK IMMEDIATE MEDICAL CARE IF:  You develop chest pain.   You develop severe shortness of breath.   You begin to cough up bloody mucus or phlegm (sputum).   You feel dizzy or faint.   You develop swelling or pain in the leg.   You have breathing problems after traveling.  MAKE SURE YOU:  Understand these instructions.   Will watch your condition.   Will get help right away if you are not doing well or get worse.  Document Released: 05/11/2005 Document Revised: 04/30/2011 Document Reviewed: 07/03/2010 [facility] Patient Information 2012 [place].  Warfarin  Warfarin is a blood thinner (anticoagulant) medicine. It is used to keep clots from forming in your blood. When you take warfarin, you may bruise easily. You may also bleed a little longer if you cut yourself.  Before taking warfarin, tell your doctor if:  You take any other medicine for your heart or blood pressure.   You are pregnant.   You plan to get pregnant.   You are breastfeeding.  You are allergic to any medicine.  HOME CARE  Take warfarin as told by your doctor. Do not stop the medicine unless your doctor tells you to.   Take your medicine at the same time every day.   Do not take anything that has aspirin in it unless your doctor says it is okay.   Do not drink alcohol.   Tell all your doctors and dentists that you are taking warfarin before they treat you or give you any medicines.   Keep all your appointments for doctor visits and blood tests.   Keep warfarin out of reach of children. Do not share warfarin with anyone.   Eat about the same amount of vitamin K foods every day.   High vitamin K foods: Beef liver. Pork liver. Green tea. Broccoli. Brussels sprouts. Cauliflower. Chickpeas. Kale. Spinach. Turnip greens.   Medium vitamin K foods: Chicken liver. Pork tenderloin. Cheddar cheese. Rolled oats. Coffee.  Asparagus. Cabbage. Iceberg lettuce.   Low vitamin K foods: Apples. Butter. Bananas. Skim or 1% milk. Orange rice. Canned pears. White bread. Strawberries. Corn. Tomatoes. Green beans. Eggs. Potatoes. Berniece Salines. Pumpkin. Chicken breasts. Ground beef. Oil (except soybean oil).  GET HELP RIGHT AWAY IF:  You miss a dose of warfarin. Do not take 2 doses at the same time.   You have a skin rash.   You have heavy or unusual bleeding.   There is blood in your pee (urine) or poop (stool).   You have side effects from medicine that do not get better after a few days.  MAKE SURE YOU:  Understand these instructions.   Will watch your condition.   Will get help right away if you are not doing well or get worse.  Document Released: 06/13/2010 Document Revised: 04/30/2011 Document Reviewed: 06/13/2010 [facility] Patient Information 2012 [place].  Carotid Stenosis and Carotid Endarterectomy Care After Refer to this sheet in the next few weeks. These discharge instructions provide you with general information on caring for yourself after you leave the hospital. Your caregiver may also give you specific instructions. Your treatment has been planned according to the most current medical practices available, but unavoidable complications sometimes occur. If you have any problems or questions after discharge, please call your caregiver. HOME CARE INSTRUCTIONS   It is normal to be sore for a couple weeks after surgery. See your caregiver if this seems to be getting worse rather than better.   Take showers, not baths, for a few days after surgery or until instructed otherwise by your caregiver. Do not take baths or swim until directed by your surgeon.   Only take over-the-counter or prescription medicines for pain, discomfort, or fever as directed by your caregiver.   A blood thinner (anticoagulant) may be prescribed after surgery. This medicine should be taken exactly as directed.   Change bandages  (dressings) as directed by your caregiver.   Resume your normal activities as directed by your caregiver.   Avoid lifting until you are instructed otherwise.   Make an appointment to see your caregiver for stitches (suture) or staple removal when instructed.   Stop smoking if you smoke. This is a grave risk factor.   Stop taking the pill (oral contraceptives) unless your caregiver recommends otherwise.   Maintain good blood pressure control.   Exercise regularly or as instructed.   Lower blood lipids (cholesterol and triglycerides).   Eat a heart-healthy diet. Manage heart problems if they are contributing to your risk.  [place]  IF:   There is increased bleeding from the wound.   You notice redness, swelling, or increasing pain in the wound.   You notice swelling in your neck or have difficulty breathing or talking.   You notice a bad smell or pus coming from the wound or dressing.   You have an oral temperature above 102 F (38.9 C).  SEEK IMMEDIATE MEDICAL CARE IF:   Your initial symptoms are getting worse rather than better.   You develop any abnormal bruising or bleeding.   You develop a rash.   You have difficulty breathing.   You develop any reaction or side effects to medicine given.   You develop chest pain, shortness of breath, or pain or swelling in your legs.   You have a return of symptoms or problems that caused you to have this surgery.   You develop a temporary loss of vision.   You develop temporary numbness on one side.   You develop a temporary inability to speak (aphasia).   You develop temporary areas of weakness.   You have problems or concerns that have not been answered.  MAKE SURE YOU:   Understand these instructions.   Will watch your condition.   Will get help right away if you are not doing well or get worse.  Document Released: 11/28/2004 Document Revised: 04/30/2011 Document Reviewed: 05/09/2008 [facility] Patient  Information 2012 [place].

## 2011-09-30 ENCOUNTER — Other Ambulatory Visit: Payer: Self-pay | Admitting: *Deleted

## 2011-09-30 DIAGNOSIS — E785 Hyperlipidemia, unspecified: Secondary | ICD-10-CM

## 2011-09-30 MED ORDER — FENOFIBRATE 145 MG PO TABS: 145.0000 mg | ORAL_TABLET | Freq: Every day | ORAL | Status: DC

## 2011-10-01 ENCOUNTER — Telehealth: Payer: Self-pay

## 2011-10-01 NOTE — Telephone Encounter (Signed)
Per Dr Cyndia Bent Pt was instructed to HOLD coumadin until Monday 10/05/2011 and Home Health ([facility]) with re check PT/INR. Barbara with [facility] notified with new orders

## 2011-10-01 NOTE — Telephone Encounter (Signed)
S/P CABG x 3 and Aorta Innomiate bypass ( Dr Oneida Alar assisted) on 09/09/2011

## 2011-10-05 ENCOUNTER — Other Ambulatory Visit: Payer: Self-pay | Admitting: *Deleted

## 2011-10-05 ENCOUNTER — Ambulatory Visit: Payer: Self-pay | Admitting: *Deleted

## 2011-10-05 DIAGNOSIS — R609 Edema, unspecified: Secondary | ICD-10-CM

## 2011-10-05 MED ORDER — POTASSIUM CHLORIDE CRYS ER 20 MEQ PO TBCR: 20.0000 meq | EXTENDED_RELEASE_TABLET | Freq: Two times a day (BID) | ORAL | Status: DC

## 2011-10-05 MED ORDER — FUROSEMIDE 40 MG PO TABS: 40.0000 mg | ORAL_TABLET | Freq: Every day | ORAL | Status: DC

## 2011-10-05 NOTE — Progress Notes (Unsigned)
Erroneous encounter

## 2011-10-07 ENCOUNTER — Encounter: Payer: Self-pay | Admitting: Vascular Surgery

## 2011-10-07 NOTE — Progress Notes (Unsigned)
Erroneous encounter

## 2011-10-08 ENCOUNTER — Ambulatory Visit (INDEPENDENT_AMBULATORY_CARE_PROVIDER_SITE_OTHER): Payer: Medicare Other | Admitting: Vascular Surgery

## 2011-10-08 ENCOUNTER — Encounter: Payer: Self-pay | Admitting: Vascular Surgery

## 2011-10-08 VITALS — BP 118/49 | HR 76 | Temp 98.5°F | Resp 16 | Ht 71.0 in | Wt 282.8 lb

## 2011-10-08 DIAGNOSIS — I771 Stricture of artery: Secondary | ICD-10-CM

## 2011-10-08 DIAGNOSIS — I739 Peripheral vascular disease, unspecified: Secondary | ICD-10-CM | POA: Insufficient documentation

## 2011-10-08 DIAGNOSIS — I6529 Occlusion and stenosis of unspecified carotid artery: Secondary | ICD-10-CM

## 2011-10-08 DIAGNOSIS — Z48812 Encounter for surgical aftercare following surgery on the circulatory system: Secondary | ICD-10-CM

## 2011-10-08 NOTE — Progress Notes (Signed)
Patient is status post recent right carotid endarterectomy and aorta to innominate bypass at the same time that Dr. Cyndia Bent did coronary artery bypass grafting. Patient has had remote left carotid endarterectomy. He returns today for postoperative followup. His stay was complicated by some postoperative delirium as well as her left arm DVT. He is currently on Coumadin. His wife states that his memory is continuing to improve. He still has some left upper extremity swelling and right leg swelling. He also complains of some difficulty urinating. He apparently had a urinary tract infection during his hospital stay. He does not describe any dysuria or fever.  The patient denies any symptoms of TIA amaurosis or stroke.  Physical exam: Filed Vitals:   10/08/11 1438  BP: 118/49  Pulse: 76  Temp: 98.5 F (36.9 C)  TempSrc: Oral  Resp: 16  Height: 5\' 11"  (1.803 m)  Weight: 282 lb 12.8 oz (128.277 kg)  SpO2: 92%   Right neck incision and sternotomy well-healed, no carotid bruit  Right lower extremity trace edema saphenectomy wound clean with no erythema  Left upper extremity diffuse edema with warmth in the mid forearm but no erythema and well-perfused  Right upper extremity 2+ right radial pulse  Assessment: Patent aorto innominate bypass and right carotid endarterectomy, diffuse swelling left upper extremity secondary to left arm DVT improved right leg saphenectomy incision.  Urinary retention symptoms  Plan: The patient will continue his Coumadin for a total of 6 months. He'll return for a CT Angio of the neck in 6 months.  If his urinary symptoms continue he will need a repeat urinalysis to make sure that he does not have an infection. If he has noticed a urinary tract infection we would need to consider urology referral. He apparently has seen a urologist in the past but could not recall the name today.  Ruta Hinds, MD [facility] Office:  [phone] Pager: [phone]

## 2011-10-09 ENCOUNTER — Other Ambulatory Visit: Payer: Self-pay | Admitting: *Deleted

## 2011-10-09 ENCOUNTER — Other Ambulatory Visit: Payer: Self-pay | Admitting: Surgery

## 2011-10-09 DIAGNOSIS — I2510 Atherosclerotic heart disease of native coronary artery without angina pectoris: Secondary | ICD-10-CM

## 2011-10-09 DIAGNOSIS — B370 Candidal stomatitis: Secondary | ICD-10-CM

## 2011-10-09 MED ORDER — FLUCONAZOLE 100 MG PO TABS: 100.0000 mg | ORAL_TABLET | Freq: Every day | ORAL | Status: DC

## 2011-10-09 NOTE — Telephone Encounter (Signed)
Mr. Kondo PT/INR today was 20.8/1.7 and was reported to Dr. Cyndia Bent.  The new dose for Coumadin is 5 mgms on M-W-F and 2.5 mgms all other days, repeating the blood work in one week.  I called his wife with the new dosing and the plan for continuing Diflucan for 2 more weeks. Also, in regards to his need for more doxycycline, Dr. Cyndia Bent requests he contact the original prescriber for this med.  She understands. I called home health to repeat the blood work in one week.

## 2011-10-09 NOTE — Progress Notes (Signed)
Addended by: Mena Goes on: 10/09/2011 07:46 AM   Modules accepted: Orders

## 2011-10-20 ENCOUNTER — Ambulatory Visit: Payer: Self-pay | Admitting: Surgery

## 2011-10-20 ENCOUNTER — Ambulatory Visit: Payer: Medicare Other | Admitting: Surgery

## 2011-10-22 ENCOUNTER — Encounter (HOSPITAL_COMMUNITY)
Admission: RE | Admit: 2011-10-22 | Discharge: 2011-10-22 | Disposition: A | Discharge status: Home / Self Care | Payer: Medicare Other | Source: Ambulatory Visit | Attending: Interventional Cardiology | Admitting: Interventional Cardiology

## 2011-10-22 NOTE — Progress Notes (Signed)
Cardiac Rehab Medication Review by a Pharmacist  Does the patient  feel that his/her medications are working for him/her?  yes  Has the patient been experiencing any side effects to the medications prescribed?  no  Does the patient measure his/her own blood pressure or blood glucose at home?  yes   Does the patient have any problems obtaining medications due to transportation or finances?   no  Understanding of regimen: good Understanding of indications: good Potential of compliance: good    Pharmacist comments: [name] is doing well and has no complaints. He reports no side effects and checks his blood pressure and blood glucose through home health.     Gerrit Halls, PharmD 10/22/2011 9:02 AM

## 2011-10-23 ENCOUNTER — Ambulatory Visit
Admission: RE | Admit: 2011-10-23 | Discharge: 2011-10-23 | Disposition: A | Discharge status: Home / Self Care | Payer: Medicare Other | Source: Ambulatory Visit | Attending: Surgery | Admitting: Surgery

## 2011-10-23 ENCOUNTER — Encounter: Payer: Self-pay | Admitting: Surgery

## 2011-10-23 ENCOUNTER — Ambulatory Visit (INDEPENDENT_AMBULATORY_CARE_PROVIDER_SITE_OTHER): Payer: Self-pay | Admitting: Surgery

## 2011-10-23 VITALS — BP 106/50 | HR 80 | Resp 20 | Ht 71.0 in | Wt 282.0 lb

## 2011-10-23 DIAGNOSIS — I6529 Occlusion and stenosis of unspecified carotid artery: Secondary | ICD-10-CM

## 2011-10-23 DIAGNOSIS — I771 Stricture of artery: Secondary | ICD-10-CM

## 2011-10-23 DIAGNOSIS — I2510 Atherosclerotic heart disease of native coronary artery without angina pectoris: Secondary | ICD-10-CM

## 2011-10-23 DIAGNOSIS — Z951 Presence of aortocoronary bypass graft: Secondary | ICD-10-CM

## 2011-10-24 ENCOUNTER — Encounter: Payer: Self-pay | Admitting: Surgery

## 2011-10-24 NOTE — Progress Notes (Signed)
HPI:  Patient returns for routine postoperative follow-up having undergone coronary bypass graft surgery x3 in conjunction with aorto- innominate bypass and right carotid endarterectomy by Dr. Oneida Alar on 09/09/2011. The patient's early postoperative recovery while in the hospital was notable for postoperative delirium which slowed down his recovery. He also developed a deep venous thrombosis in the left subclavian vein and was started on heparin and Coumadin. He has been on Coumadin since discharge. His INR was checked today and was 2.6. He is currently on 5 mg of Coumadin on Monday, Wednesday, and Friday and 2.5 mg on the other days. Since hospital discharge the patient reports that he is doing much better. His wife feels that his mental status is back to normal although he still has some difficulty focusing and some memory disturbance at times. This is continuing to improve though. He denies any chest pain or pressure. He has had no shortness of breath.   Current Outpatient Prescriptions  Medication Sig Dispense Refill  . amLODipine (NORVASC) 10 MG tablet Take 1 tablet (10 mg total) by mouth daily.  30 tablet  1  . aspirin 325 MG EC tablet Take 325 mg by mouth daily.      Marland Kitchen atorvastatin (LIPITOR) 20 MG tablet Take 20 mg by mouth daily.      Marland Kitchen b complex vitamins capsule Take 1 capsule by mouth daily.        . Cetirizine HCl (ZYRTEC ALLERGY PO) Take 1 tablet by mouth daily as needed. For allergies      . cilostazol (PLETAL) 100 MG tablet Take 100 mg by mouth 2 (two) times daily.       Marland Kitchen doxycycline (VIBRA-TABS) 100 MG tablet Take 100 mg by mouth daily.       . fenofibrate (TRICOR) 145 MG tablet Take 1 tablet (145 mg total) by mouth daily.  30 tablet  0  . fluconazole (DIFLUCAN) 100 MG tablet Take 1 tablet (100 mg total) by mouth daily.  14 tablet  0  . furosemide (LASIX) 40 MG tablet Take 1 tablet (40 mg total) by mouth daily.  14 tablet  0  . gabapentin (NEURONTIN) 300 MG capsule Take 300 mg by  mouth 2 (two) times daily.       Marland Kitchen glipiZIDE (GLUCOTROL) 10 MG tablet Take 10 mg by mouth daily.       . Loperamide HCl (IMODIUM PO) Take 1 tablet by mouth daily as needed. For diarrhea      . metFORMIN (GLUCOPHAGE) 500 MG tablet Take 500 mg by mouth 2 (two) times daily with a meal.       . metoprolol (LOPRESSOR) 50 MG tablet Take 1 tablet (50 mg total) by mouth 2 (two) times daily.  60 tablet  1  . pioglitazone (ACTOS) 30 MG tablet Take 15 mg by mouth daily.       . potassium chloride SA (K-DUR,KLOR-CON) 20 MEQ tablet Take 20 mEq by mouth daily.      . pseudoephedrine-guaifenesin (MUCINEX D) 60-600 MG per tablet Take 1 tablet by mouth as needed. Congestion      . ramipril (ALTACE) 10 MG tablet Take 10 mg by mouth 2 (two) times daily.       . temazepam (RESTORIL) 15 MG capsule Take 15 mg by mouth at bedtime as needed. For sleep      . traMADol (ULTRAM) 50 MG tablet Take 50 mg by mouth every 6 (six) hours as needed.       . warfarin (  COUMADIN) 2.5 MG tablet Take 1 tablet (2.5 mg total) by mouth daily at 6 PM.  30 tablet  1    Physical Exam: BP 106/50  Pulse 80  Resp 20  Ht 5\' 11"  (1.803 m)  Wt 282 lb (127.914 kg)  BMI 39.33 kg/m2  SpO2 94% He looks well. The right neck incision is healing well. Cardiac exam shows a regular rate and rhythm with normal heart sounds. His lung exam is clear. The chest incision is healing well and the sternum is stable. The right leg incisions are healing well and there is mild bilateral lower extremity edema.  Diagnostic Tests:  RADIOLOGY REPORT*   Clinical Data: CABG 3 weeks here, follow-up, some dizziness   CHEST - 2 VIEW   Comparison: Portable chest x-ray of 09/21/2011   Findings: The lungs are clear.  No focal infiltrate or effusion is seen.  Cardiomegaly is stable.  Median sternotomy sutures are noted.  There are degenerative changes in the thoracic spine.   IMPRESSION: Stable cardiomegaly.  No active lung disease.   Original Report  Authenticated By: Joretta Bachelor, M.D.                         Signed  Date/Time    Phone  Pager     Joretta Bachelor  10/23/2011  12:23 PM EDT        Impression:  Overall I think he is making a good recovery following his surgery especially considering his preoperative condition and postoperative delirium. He will need to remain on Coumadin for about 6 months for the LUE DVT. We will check his INR again in about 2 weeks. The goal should be 2.5-3.0. I encouraged him to continue walking as much as possible. He is planning to start cardiac rehabilitation next week. I told him he can return to driving a car at this time but he should refrain from lifting anything heavier than 10 pounds for a total of 3 months from the date of surgery.  Plan:  I will plan to see him back in one month for followup.

## 2011-10-26 ENCOUNTER — Encounter (HOSPITAL_COMMUNITY): Payer: Medicare Other

## 2011-10-27 ENCOUNTER — Other Ambulatory Visit: Payer: Self-pay | Admitting: *Deleted

## 2011-10-27 DIAGNOSIS — B370 Candidal stomatitis: Secondary | ICD-10-CM

## 2011-10-27 MED ORDER — FLUCONAZOLE 100 MG PO TABS: 100.0000 mg | ORAL_TABLET | Freq: Every day | ORAL | Status: DC

## 2011-10-28 ENCOUNTER — Encounter (HOSPITAL_COMMUNITY): Payer: Medicare Other

## 2011-10-28 ENCOUNTER — Encounter (HOSPITAL_COMMUNITY)
Admission: RE | Admit: 2011-10-28 | Discharge: 2011-10-28 | Disposition: A | Discharge status: Home / Self Care | Payer: Medicare Other | Source: Ambulatory Visit | Attending: Interventional Cardiology | Admitting: Interventional Cardiology

## 2011-10-28 DIAGNOSIS — Z86718 Personal history of other venous thrombosis and embolism: Secondary | ICD-10-CM | POA: Insufficient documentation

## 2011-10-28 DIAGNOSIS — Z951 Presence of aortocoronary bypass graft: Secondary | ICD-10-CM | POA: Insufficient documentation

## 2011-10-28 DIAGNOSIS — I6529 Occlusion and stenosis of unspecified carotid artery: Secondary | ICD-10-CM | POA: Insufficient documentation

## 2011-10-28 DIAGNOSIS — Z5189 Encounter for other specified aftercare: Secondary | ICD-10-CM | POA: Insufficient documentation

## 2011-10-28 DIAGNOSIS — I2510 Atherosclerotic heart disease of native coronary artery without angina pectoris: Secondary | ICD-10-CM | POA: Insufficient documentation

## 2011-10-28 DIAGNOSIS — E119 Type 2 diabetes mellitus without complications: Secondary | ICD-10-CM | POA: Insufficient documentation

## 2011-10-28 LAB — GLUCOSE, CAPILLARY: Glucose-Capillary: 145 mg/dL — ABNORMAL HIGH (ref 70–99)

## 2011-10-28 NOTE — Progress Notes (Signed)
Pt started cardiac rehab today.  Pt tolerated light exercise with some difficulty while ambulating around the track.  Pt develops calf pain with ambulation.  Pt able to walk 200 feet around the track before the calf pain began.  Pt provided a rolling walker for increased stability.  Monitor showed SR with RBBB which is present on his EKG from Dr. Thompson Caul office on June 3rd.  Pt with  medication changes - amlodipine decreased to 5mg  daily and nadolol discontinued due to orthostatic symptoms.  Continue to monitor and encourage pt.

## 2011-10-30 ENCOUNTER — Encounter (HOSPITAL_COMMUNITY)
Admission: RE | Admit: 2011-10-30 | Discharge: 2011-10-30 | Disposition: A | Discharge status: Home / Self Care | Payer: Medicare Other | Source: Ambulatory Visit | Attending: Interventional Cardiology | Admitting: Interventional Cardiology

## 2011-10-30 ENCOUNTER — Encounter (HOSPITAL_COMMUNITY): Payer: Medicare Other

## 2011-10-30 LAB — GLUCOSE, CAPILLARY
Glucose-Capillary: 133 mg/dL — ABNORMAL HIGH (ref 70–99)
Glucose-Capillary: 129 mg/dL — ABNORMAL HIGH (ref 70–99)

## 2011-11-02 ENCOUNTER — Encounter (HOSPITAL_COMMUNITY)
Admission: RE | Admit: 2011-11-02 | Discharge: 2011-11-02 | Disposition: A | Discharge status: Home / Self Care | Payer: Medicare Other | Source: Ambulatory Visit | Attending: Interventional Cardiology | Admitting: Interventional Cardiology

## 2011-11-02 ENCOUNTER — Encounter (HOSPITAL_COMMUNITY): Payer: Medicare Other

## 2011-11-02 LAB — GLUCOSE, CAPILLARY: Glucose-Capillary: 141 mg/dL — ABNORMAL HIGH (ref 70–99)

## 2011-11-04 ENCOUNTER — Encounter (HOSPITAL_COMMUNITY): Payer: Medicare Other

## 2011-11-04 ENCOUNTER — Encounter (HOSPITAL_COMMUNITY)
Admission: RE | Admit: 2011-11-04 | Discharge: 2011-11-04 | Disposition: A | Discharge status: Home / Self Care | Payer: Medicare Other | Source: Ambulatory Visit | Attending: Interventional Cardiology | Admitting: Interventional Cardiology

## 2011-11-04 LAB — GLUCOSE, CAPILLARY: Glucose-Capillary: 93 mg/dL (ref 70–99)

## 2011-11-06 ENCOUNTER — Encounter (HOSPITAL_COMMUNITY)
Admission: RE | Admit: 2011-11-06 | Discharge: 2011-11-06 | Disposition: A | Discharge status: Home / Self Care | Payer: Medicare Other | Source: Ambulatory Visit | Attending: Interventional Cardiology | Admitting: Interventional Cardiology

## 2011-11-06 ENCOUNTER — Encounter (HOSPITAL_COMMUNITY): Payer: Medicare Other

## 2011-11-06 NOTE — Progress Notes (Signed)
Hal saw Dr Laurann Montana earlier this week. Dr Laurann Montana discontinued Hals' lasix, potassium and actos. Medications reconcilled

## 2011-11-09 ENCOUNTER — Other Ambulatory Visit: Payer: Self-pay | Admitting: Surgery

## 2011-11-09 ENCOUNTER — Encounter (HOSPITAL_COMMUNITY)
Admission: RE | Admit: 2011-11-09 | Discharge: 2011-11-09 | Disposition: A | Discharge status: Home / Self Care | Payer: Medicare Other | Source: Ambulatory Visit | Attending: Interventional Cardiology | Admitting: Interventional Cardiology

## 2011-11-09 ENCOUNTER — Encounter (HOSPITAL_COMMUNITY): Payer: Medicare Other

## 2011-11-09 LAB — PROTIME-INR: INR: 2.28 — ABNORMAL HIGH (ref <1.50)

## 2011-11-09 NOTE — Progress Notes (Signed)
Reviewed home exercise with pt today.  Pt plans to walk (as tolerated), walk in the pool at home, and go to the fitness room at the [facility] for exercise.  Reviewed THR, pulse, RPE, sign and symptoms, and when to call 911 or MD.  Pt voiced understanding. Alberteen Sam, MA, ACSM RCEP

## 2011-11-11 ENCOUNTER — Encounter (HOSPITAL_COMMUNITY): Payer: Medicare Other

## 2011-11-11 ENCOUNTER — Encounter (HOSPITAL_COMMUNITY)
Admission: RE | Admit: 2011-11-11 | Discharge: 2011-11-11 | Disposition: A | Discharge status: Home / Self Care | Payer: Medicare Other | Source: Ambulatory Visit | Attending: Interventional Cardiology | Admitting: Interventional Cardiology

## 2011-11-11 NOTE — Progress Notes (Signed)
Harrison Mons 71 y.o. male Nutrition Note Spoke with pt.  Nutrition Plan, Nutrition Survey, and cholesterol goals reviewed with pt. Pt wants to lose wt. Per pt, his UBW pre-hospitalization was 299#. Pt wt today was 268#, which is a 31# decrease over the past 2 months. Rate of wt loss greater than desired rate. Some wt loss likely due to loss of muscle mass post-operatively. Pt wt is down 10# over the past 2 weeks. Pt's rapid wt loss discussed with pt. Pt is now 90% UBW. Pt encouraged to slow rate of wt loss down and reasoning explained. Pt expressed understanding. There are many areas for improvement in pt diet for pt diet to become heart healthy. Pt's MEDFICTS survey results reviewed. This Probation officer went over Diabetes Education test results and answers missed explained. Pt states he checks his CBG's "once in a blue moon" because his A1c has been "so good my doctor has taken me off of some of my DM medication." Pt unaware of recommended CBG ranges before meals or 1-2 hours after meals. Recommended ranges reviewed with pt. Pt is on Coumadin. Pt states he was unaware of the need to consume a consistent amount of vitamin K with Coumadin.  Consistent Vitamin K intake and Coumadin reviewed with pt. Pt expressed understanding.  Nutrition Diagnosis   Food-and nutrition-related knowledge deficit related to lack of exposure to information as related to diagnosis of: ? CVD ? DM (A1c 6.4)     Obesity related to excessive energy intake as evidenced by a BMI of 41.8  Nutrition RX/ Estimated Daily Nutrition Needs for: wt loss  1600-2100 Kcal, 40-55 gm fat, 12-16 gm sat fat, 1.6-2.1 gm trans-fat, <1500 mg sodium , 175-250 gm CHO   Nutrition Intervention   Pt's individual nutrition plan including cholesterol goals reviewed with pt.   Benefits of adopting Therapeutic Lifestyle Changes discussed when Medficts reviewed.   Pt to attend the Portion Distortion class   Pt to attend the  ? Nutrition I class      ? Nutrition II class        ? Diabetes Blitz class       ? Diabetes Q & A class   Pt given handouts for: ? wt loss ? DM ? Consistent vit K diet    Continue client-centered nutrition education by RD, as part of interdisciplinary care. Goal(s)   Pt to identify and limit food sources of saturated fat, trans fat, and cholesterol   Pt to identify food quantities necessary to achieve: ? wt loss to a goal wt of 254-266 lb (115.5-120.9 kg) at graduation from cardiac rehab.    Pt able to name foods rich in vitamin K. (Pt taking Coumadin/Warfarin). Monitor and Evaluate progress toward nutrition goal with team.

## 2011-11-13 ENCOUNTER — Encounter (HOSPITAL_COMMUNITY): Payer: Medicare Other

## 2011-11-13 ENCOUNTER — Encounter (HOSPITAL_COMMUNITY)
Admission: RE | Admit: 2011-11-13 | Discharge: 2011-11-13 | Disposition: A | Discharge status: Home / Self Care | Payer: Medicare Other | Source: Ambulatory Visit | Attending: Interventional Cardiology | Admitting: Interventional Cardiology

## 2011-11-16 ENCOUNTER — Encounter (HOSPITAL_COMMUNITY): Payer: Medicare Other

## 2011-11-16 ENCOUNTER — Encounter (HOSPITAL_COMMUNITY)
Admission: RE | Admit: 2011-11-16 | Discharge: 2011-11-16 | Disposition: A | Discharge status: Home / Self Care | Payer: Medicare Other | Source: Ambulatory Visit | Attending: Interventional Cardiology | Admitting: Interventional Cardiology

## 2011-11-17 ENCOUNTER — Ambulatory Visit (INDEPENDENT_AMBULATORY_CARE_PROVIDER_SITE_OTHER): Payer: Self-pay | Admitting: Surgery

## 2011-11-17 ENCOUNTER — Encounter: Payer: Self-pay | Admitting: Surgery

## 2011-11-17 VITALS — BP 125/71 | HR 71 | Resp 20 | Ht 71.0 in | Wt 282.0 lb

## 2011-11-17 DIAGNOSIS — I6529 Occlusion and stenosis of unspecified carotid artery: Secondary | ICD-10-CM

## 2011-11-17 DIAGNOSIS — I2510 Atherosclerotic heart disease of native coronary artery without angina pectoris: Secondary | ICD-10-CM

## 2011-11-17 DIAGNOSIS — I771 Stricture of artery: Secondary | ICD-10-CM

## 2011-11-17 DIAGNOSIS — Z951 Presence of aortocoronary bypass graft: Secondary | ICD-10-CM

## 2011-11-17 NOTE — Progress Notes (Signed)
[address]            [place] [address]          [phone]      HPI:  Patient returns for routine postoperative follow-up having undergone coronary bypass graft surgery x3 in conjunction with aorto- innominate bypass and right carotid endarterectomy by Dr. Oneida Alar on 09/09/2011.  The patient's early postoperative recovery while in the hospital was notable for postoperative delirium which slowed down his recovery. He also developed a deep venous thrombosis in the left subclavian vein and was started on heparin and Coumadin. He has been on Coumadin since discharge. His last INR was 2.3 on 11/09/2011. He said he continues to improve. He still notes some mild short-term memory disturbance. He is participating in cardiac rehabilitation which he feels is helping him a lot.   Current Outpatient Prescriptions  Medication Sig Dispense Refill  . aspirin 325 MG EC tablet Take 325 mg by mouth daily.      Marland Kitchen atorvastatin (LIPITOR) 20 MG tablet Take 20 mg by mouth daily.      Marland Kitchen b complex vitamins capsule Take 1 capsule by mouth daily.        . Cetirizine HCl (ZYRTEC ALLERGY PO) Take 1 tablet by mouth daily as needed. For allergies      . cilostazol (PLETAL) 100 MG tablet Take 100 mg by mouth 2 (two) times daily.       . fenofibrate (TRICOR) 145 MG tablet Take 1 tablet (145 mg total) by mouth daily.  30 tablet  0  . ferrous gluconate (FERGON) 325 MG tablet Take 325 mg by mouth daily with breakfast.      . gabapentin (NEURONTIN) 300 MG capsule Take 300 mg by mouth 2 (two) times daily.       Marland Kitchen glipiZIDE (GLUCOTROL) 10 MG tablet Take 10 mg by mouth daily.       . Loperamide HCl (IMODIUM PO) Take 1 tablet by mouth daily as needed. For diarrhea      . metFORMIN (GLUCOPHAGE) 500 MG tablet Take 500 mg by mouth 2 (two) times daily with a meal.       . metoprolol (LOPRESSOR) 50 MG tablet Take 1 tablet (50 mg total) by mouth 2 (two) times daily.  60 tablet  1  .  pseudoephedrine-guaifenesin (MUCINEX D) 60-600 MG per tablet Take 1 tablet by mouth as needed. Congestion      . ramipril (ALTACE) 10 MG tablet Take 10 mg by mouth 2 (two) times daily.       . temazepam (RESTORIL) 15 MG capsule Take 15 mg by mouth at bedtime as needed. For sleep      . warfarin (COUMADIN) 2.5 MG tablet Take 1 tablet (2.5 mg total) by mouth daily at 6 PM.  30 tablet  1     Physical Exam:  BP 125/71  Pulse 71  Resp 20  Ht 5\' 11"  (1.803 m)  Wt 282 lb (127.914 kg)  BMI 39.33 kg/m2  SpO2 96% He looks well. Lung exam is clear. Cardiac exam shows a regular rate and rhythm with normal heart sounds. There is mild left upper extremity edema do to his DVT. There is no significant lower extremity edema.   Impression:  He continues to improve postoperatively. He will remain on Coumadin for about 6 months postoperatively for left upper extremity DVT. I will  continue to follow his INR and this will be checked again on Friday of this week.  Plan:  We will followup his INR later this week. I will see him back in about 2 months.

## 2011-11-18 ENCOUNTER — Encounter (HOSPITAL_COMMUNITY)
Admission: RE | Admit: 2011-11-18 | Discharge: 2011-11-18 | Disposition: A | Discharge status: Home / Self Care | Payer: Medicare Other | Source: Ambulatory Visit | Attending: Interventional Cardiology | Admitting: Interventional Cardiology

## 2011-11-18 ENCOUNTER — Encounter (HOSPITAL_COMMUNITY): Payer: Medicare Other

## 2011-11-18 LAB — GLUCOSE, CAPILLARY: Glucose-Capillary: 167 mg/dL — ABNORMAL HIGH (ref 70–99)

## 2011-11-19 NOTE — Progress Notes (Addendum)
[name]'s weight was up 2 kg yesterday from Monday. Positive right lower extremity and left hand and arm edema present. [name] mention experiencing some shortness of breath but no more than usual. SA02 99% on room air.  Lung fields with diminished breath sounds in the right base otherwise clear. Patient also complained of cramping in his legs when walking since Monday.  Dr Thompson Caul office called and notified via phone and fax.  Dyann Ruddle Dr [facility] nurse  recommended that I call Dr Delene Ruffini office. I Faxed the information over to Dr Delene Ruffini office and left a message with [employer].  I talked with [employer] this morning.  [employer] has talked with [name].  Dr Laurann Montana restarted his lasix and his potassium. Patient will return to exercise tomorrow. Will continue to monitor the patient throughout  the program.

## 2011-11-20 ENCOUNTER — Encounter (HOSPITAL_COMMUNITY): Payer: Medicare Other

## 2011-11-20 ENCOUNTER — Encounter (HOSPITAL_COMMUNITY)
Admission: RE | Admit: 2011-11-20 | Discharge: 2011-11-20 | Disposition: A | Discharge status: Home / Self Care | Payer: Medicare Other | Source: Ambulatory Visit | Attending: Interventional Cardiology | Admitting: Interventional Cardiology

## 2011-11-20 ENCOUNTER — Other Ambulatory Visit: Payer: Self-pay | Admitting: Surgery

## 2011-11-20 LAB — GLUCOSE, CAPILLARY: Glucose-Capillary: 132 mg/dL — ABNORMAL HIGH (ref 70–99)

## 2011-11-20 NOTE — Progress Notes (Signed)
[name] weight was down today.  Less swelling noted.  No complaints with exercise today. Dr Laurann Montana instructed [name] to take Lasix and Potassium for 2 days.

## 2011-11-23 ENCOUNTER — Encounter (HOSPITAL_COMMUNITY): Payer: Medicare Other

## 2011-11-23 ENCOUNTER — Encounter (HOSPITAL_COMMUNITY)
Admission: RE | Admit: 2011-11-23 | Discharge: 2011-11-23 | Disposition: A | Discharge status: Home / Self Care | Payer: Medicare Other | Source: Ambulatory Visit | Attending: Interventional Cardiology | Admitting: Interventional Cardiology

## 2011-11-23 DIAGNOSIS — Z951 Presence of aortocoronary bypass graft: Secondary | ICD-10-CM | POA: Insufficient documentation

## 2011-11-23 DIAGNOSIS — Z5189 Encounter for other specified aftercare: Secondary | ICD-10-CM | POA: Insufficient documentation

## 2011-11-23 DIAGNOSIS — E119 Type 2 diabetes mellitus without complications: Secondary | ICD-10-CM | POA: Insufficient documentation

## 2011-11-23 DIAGNOSIS — I6529 Occlusion and stenosis of unspecified carotid artery: Secondary | ICD-10-CM | POA: Insufficient documentation

## 2011-11-23 DIAGNOSIS — Z86718 Personal history of other venous thrombosis and embolism: Secondary | ICD-10-CM | POA: Insufficient documentation

## 2011-11-23 DIAGNOSIS — I2510 Atherosclerotic heart disease of native coronary artery without angina pectoris: Secondary | ICD-10-CM | POA: Insufficient documentation

## 2011-11-23 NOTE — Progress Notes (Signed)
[name] said his hit his toe at home and thinks he may have broken it. [name] has an appointment with Dr Lynann Bologna today at 2:00pm. [name] will not be here today for exercise.

## 2011-11-24 ENCOUNTER — Other Ambulatory Visit: Payer: Self-pay | Admitting: Surgery

## 2011-11-25 ENCOUNTER — Encounter (HOSPITAL_COMMUNITY): Payer: Medicare Other

## 2011-11-27 ENCOUNTER — Encounter (HOSPITAL_COMMUNITY): Payer: Medicare Other

## 2011-11-30 ENCOUNTER — Encounter (HOSPITAL_COMMUNITY): Payer: Medicare Other

## 2011-12-02 ENCOUNTER — Encounter (HOSPITAL_COMMUNITY): Payer: Medicare Other

## 2011-12-04 ENCOUNTER — Encounter (HOSPITAL_COMMUNITY): Payer: Medicare Other

## 2011-12-07 ENCOUNTER — Encounter (HOSPITAL_COMMUNITY): Payer: Medicare Other

## 2011-12-09 ENCOUNTER — Encounter (HOSPITAL_COMMUNITY): Payer: Medicare Other

## 2011-12-11 ENCOUNTER — Encounter (HOSPITAL_COMMUNITY): Payer: Medicare Other

## 2011-12-14 ENCOUNTER — Encounter (HOSPITAL_COMMUNITY): Payer: Medicare Other

## 2011-12-16 ENCOUNTER — Encounter (HOSPITAL_COMMUNITY): Payer: Medicare Other

## 2011-12-18 ENCOUNTER — Encounter (HOSPITAL_COMMUNITY): Payer: Medicare Other

## 2011-12-21 ENCOUNTER — Encounter (HOSPITAL_COMMUNITY): Payer: Medicare Other

## 2011-12-21 ENCOUNTER — Other Ambulatory Visit: Payer: Self-pay | Admitting: Surgery

## 2011-12-22 LAB — PROTIME-INR: Prothrombin Time: 15.4 seconds — ABNORMAL HIGH (ref 11.6–15.2)

## 2011-12-23 ENCOUNTER — Encounter (HOSPITAL_COMMUNITY): Payer: Medicare Other

## 2011-12-24 ENCOUNTER — Telehealth (HOSPITAL_COMMUNITY): Payer: Self-pay | Admitting: *Deleted

## 2011-12-25 ENCOUNTER — Encounter (HOSPITAL_COMMUNITY): Admission: RE | Admit: 2011-12-25 | Payer: Medicare Other | Source: Ambulatory Visit

## 2011-12-25 ENCOUNTER — Encounter (HOSPITAL_COMMUNITY): Payer: Medicare Other

## 2011-12-28 ENCOUNTER — Encounter (HOSPITAL_COMMUNITY)
Admission: RE | Admit: 2011-12-28 | Discharge: 2011-12-28 | Disposition: A | Discharge status: Home / Self Care | Payer: Medicare Other | Source: Ambulatory Visit | Attending: Interventional Cardiology | Admitting: Interventional Cardiology

## 2011-12-28 ENCOUNTER — Encounter (HOSPITAL_COMMUNITY): Payer: Medicare Other

## 2011-12-28 DIAGNOSIS — I2510 Atherosclerotic heart disease of native coronary artery without angina pectoris: Secondary | ICD-10-CM | POA: Insufficient documentation

## 2011-12-28 DIAGNOSIS — I6529 Occlusion and stenosis of unspecified carotid artery: Secondary | ICD-10-CM | POA: Insufficient documentation

## 2011-12-28 DIAGNOSIS — E119 Type 2 diabetes mellitus without complications: Secondary | ICD-10-CM | POA: Insufficient documentation

## 2011-12-28 DIAGNOSIS — Z86718 Personal history of other venous thrombosis and embolism: Secondary | ICD-10-CM | POA: Insufficient documentation

## 2011-12-28 DIAGNOSIS — Z951 Presence of aortocoronary bypass graft: Secondary | ICD-10-CM | POA: Insufficient documentation

## 2011-12-28 DIAGNOSIS — Z5189 Encounter for other specified aftercare: Secondary | ICD-10-CM | POA: Insufficient documentation

## 2011-12-29 NOTE — Progress Notes (Signed)
[name] returned to exercise today. Initial blood pressure 92/40. Patient asymptomatic repeat blood pressure 93/55 via dinemapp. [name] was able to complete exercise without further complaints.  Exit blood pressure 104/62. Will notify Dr Thompson Caul office about [name]'s blood pressure via fax. Will continue to monitor the patient throughout  the program.

## 2011-12-30 ENCOUNTER — Encounter (HOSPITAL_COMMUNITY): Payer: Medicare Other

## 2011-12-30 ENCOUNTER — Encounter (HOSPITAL_COMMUNITY)
Admission: RE | Admit: 2011-12-30 | Discharge: 2011-12-30 | Disposition: A | Discharge status: Home / Self Care | Payer: Medicare Other | Source: Ambulatory Visit | Attending: Interventional Cardiology | Admitting: Interventional Cardiology

## 2011-12-30 NOTE — Progress Notes (Signed)
Post exercise CBG 283. Myka says he ate rye toast for breakfast and some pineapple.  Will continue to monitor CBG's. Bennye Alm ANP-C reviewed Hal's blood pressures.  No change in therapy.

## 2012-01-01 ENCOUNTER — Encounter (HOSPITAL_COMMUNITY)
Admission: RE | Admit: 2012-01-01 | Discharge: 2012-01-01 | Disposition: A | Discharge status: Home / Self Care | Payer: Medicare Other | Source: Ambulatory Visit | Attending: Interventional Cardiology | Admitting: Interventional Cardiology

## 2012-01-01 ENCOUNTER — Encounter (HOSPITAL_COMMUNITY): Payer: Medicare Other

## 2012-01-01 LAB — GLUCOSE, CAPILLARY: Glucose-Capillary: 251 mg/dL — ABNORMAL HIGH (ref 70–99)

## 2012-01-02 ENCOUNTER — Other Ambulatory Visit: Payer: Self-pay | Admitting: Surgery

## 2012-01-04 ENCOUNTER — Encounter (HOSPITAL_COMMUNITY)
Admission: RE | Admit: 2012-01-04 | Discharge: 2012-01-04 | Disposition: A | Discharge status: Home / Self Care | Payer: Medicare Other | Source: Ambulatory Visit | Attending: Interventional Cardiology | Admitting: Interventional Cardiology

## 2012-01-04 ENCOUNTER — Encounter (HOSPITAL_COMMUNITY): Payer: Medicare Other

## 2012-01-06 ENCOUNTER — Encounter (HOSPITAL_COMMUNITY): Payer: Medicare Other

## 2012-01-06 ENCOUNTER — Encounter (HOSPITAL_COMMUNITY)
Admission: RE | Admit: 2012-01-06 | Discharge: 2012-01-06 | Disposition: A | Discharge status: Home / Self Care | Payer: Medicare Other | Source: Ambulatory Visit | Attending: Interventional Cardiology | Admitting: Interventional Cardiology

## 2012-01-06 LAB — GLUCOSE, CAPILLARY: Glucose-Capillary: 185 mg/dL — ABNORMAL HIGH (ref 70–99)

## 2012-01-08 ENCOUNTER — Encounter (HOSPITAL_COMMUNITY)
Admission: RE | Admit: 2012-01-08 | Discharge: 2012-01-08 | Disposition: A | Discharge status: Home / Self Care | Payer: Medicare Other | Source: Ambulatory Visit | Attending: Interventional Cardiology | Admitting: Interventional Cardiology

## 2012-01-08 ENCOUNTER — Encounter (HOSPITAL_COMMUNITY): Payer: Medicare Other

## 2012-01-10 ENCOUNTER — Other Ambulatory Visit: Payer: Self-pay | Admitting: Surgery

## 2012-01-11 ENCOUNTER — Encounter (HOSPITAL_COMMUNITY)
Admission: RE | Admit: 2012-01-11 | Discharge: 2012-01-11 | Disposition: A | Discharge status: Home / Self Care | Payer: Medicare Other | Source: Ambulatory Visit | Attending: Interventional Cardiology | Admitting: Interventional Cardiology

## 2012-01-11 ENCOUNTER — Encounter (HOSPITAL_COMMUNITY): Payer: Medicare Other

## 2012-01-11 LAB — GLUCOSE, CAPILLARY: Glucose-Capillary: 194 mg/dL — ABNORMAL HIGH (ref 70–99)

## 2012-01-13 ENCOUNTER — Encounter (HOSPITAL_COMMUNITY)
Admission: RE | Admit: 2012-01-13 | Discharge: 2012-01-13 | Disposition: A | Discharge status: Home / Self Care | Payer: Medicare Other | Source: Ambulatory Visit | Attending: Interventional Cardiology | Admitting: Interventional Cardiology

## 2012-01-13 ENCOUNTER — Encounter (HOSPITAL_COMMUNITY): Payer: Medicare Other

## 2012-01-13 LAB — GLUCOSE, CAPILLARY: Glucose-Capillary: 182 mg/dL — ABNORMAL HIGH (ref 70–99)

## 2012-01-15 ENCOUNTER — Encounter (HOSPITAL_COMMUNITY): Payer: Medicare Other

## 2012-01-15 ENCOUNTER — Encounter (HOSPITAL_COMMUNITY)
Admission: RE | Admit: 2012-01-15 | Discharge: 2012-01-15 | Disposition: A | Discharge status: Home / Self Care | Payer: Medicare Other | Source: Ambulatory Visit | Attending: Interventional Cardiology | Admitting: Interventional Cardiology

## 2012-01-18 ENCOUNTER — Encounter (HOSPITAL_COMMUNITY): Payer: Medicare Other

## 2012-01-18 ENCOUNTER — Other Ambulatory Visit: Payer: Self-pay | Admitting: Surgery

## 2012-01-18 ENCOUNTER — Encounter (HOSPITAL_COMMUNITY)
Admission: RE | Admit: 2012-01-18 | Discharge: 2012-01-18 | Disposition: A | Discharge status: Home / Self Care | Payer: Medicare Other | Source: Ambulatory Visit | Attending: Interventional Cardiology | Admitting: Interventional Cardiology

## 2012-01-18 LAB — PROTIME-INR
INR: 1.08 (ref <1.50)
Prothrombin Time: 14.4 seconds (ref 11.6–15.2)

## 2012-01-18 NOTE — Progress Notes (Signed)
Pt arrived to cardiac rehab for exercise.  Pt reported that over the wknd he had two episodes of angina radiating to his jaw that was relieved with 2 NTG. Pt reports the discomfort was very similar to the pain he experienced before he had his CABG on 09/09/11. Presently pt is pain free.  Pt BP 120/80, Blood sugar 266.  Mariann Laster at Dr. Thompson Caul office called.  Pt  Scheduled for an appointment for 2:00 this afternoon to see Dr. Tamala Julian in f/u.  Pt advised not to exercise until seen and cleared by Dr. Tamala Julian.  Pt verbalized understanding.  Rehab report faxed to office for MD review.

## 2012-01-19 ENCOUNTER — Ambulatory Visit (INDEPENDENT_AMBULATORY_CARE_PROVIDER_SITE_OTHER): Payer: Medicare Other | Admitting: Surgery

## 2012-01-19 VITALS — BP 121/59 | HR 62 | Resp 18 | Ht 71.0 in | Wt 276.9 lb

## 2012-01-19 DIAGNOSIS — Z09 Encounter for follow-up examination after completed treatment for conditions other than malignant neoplasm: Secondary | ICD-10-CM

## 2012-01-19 DIAGNOSIS — I2510 Atherosclerotic heart disease of native coronary artery without angina pectoris: Secondary | ICD-10-CM

## 2012-01-20 ENCOUNTER — Encounter (HOSPITAL_COMMUNITY): Payer: Medicare Other

## 2012-01-22 ENCOUNTER — Encounter (HOSPITAL_COMMUNITY): Payer: Medicare Other

## 2012-01-22 ENCOUNTER — Telehealth (HOSPITAL_COMMUNITY): Payer: Self-pay | Admitting: *Deleted

## 2012-01-22 NOTE — Telephone Encounter (Signed)
Received ok from Dr. Tamala Julian office to return to rehab.  Pt called and spoke to him.  Pt will resume exercise on Wednesday due to the holiday closing on Monday for Labor Day.

## 2012-01-25 ENCOUNTER — Encounter (HOSPITAL_COMMUNITY): Payer: Medicare Other

## 2012-01-25 ENCOUNTER — Encounter: Payer: Self-pay | Admitting: Surgery

## 2012-01-25 NOTE — Progress Notes (Signed)
[address]            [place] [address]          [phone]      HPI:  Patient returns for routine postoperative follow-up having undergone coronary bypass graft surgery x3 in conjunction with aorto- innominate bypass and right carotid endarterectomy by Dr. Oneida Alar on 09/09/2011.   The patient's early postoperative recovery while in the hospital was notable for postoperative delirium which slowed down his recovery. He also developed a deep venous thrombosis in the left subclavian vein and was started on heparin and Coumadin. He has been on Coumadin since discharge. His last INR was normal. He still notes some mild short-term memory disturbance especially with names. He is participating in cardiac rehabilitation the last time he went to rehabilitation he told him about a few episodes of chest discomfort radiating up into his neck and jaw for which she took nitroglycerin and a told him not come back to rehabilitation until he been seen by Dr. Tamala Julian. He says he has been walking daily but recently had a few episodes of upper chest discomfort radiating onto his neck that concerned him. These were relieved by sublingual nitroglycerin.    Current Outpatient Prescriptions  Medication Sig Dispense Refill  . aspirin 325 MG EC tablet Take 325 mg by mouth daily.      Marland Kitchen atorvastatin (LIPITOR) 20 MG tablet Take 20 mg by mouth daily.      Marland Kitchen b complex vitamins capsule Take 1 capsule by mouth daily.        . Cetirizine HCl (ZYRTEC ALLERGY PO) Take 1 tablet by mouth daily as needed. For allergies      . cilostazol (PLETAL) 100 MG tablet Take 100 mg by mouth 2 (two) times daily.       . fenofibrate (TRICOR) 145 MG tablet Take 1 tablet (145 mg total) by mouth daily.  30 tablet  0  . ferrous gluconate (FERGON) 325 MG tablet Take 325 mg by mouth 2 (two) times daily.       . furosemide (LASIX) 40 MG tablet TAKE ONE TABLET BY MOUTH IN THE MORNING  30 tablet  0  . gabapentin  (NEURONTIN) 300 MG capsule Take 300 mg by mouth 2 (two) times daily.       Marland Kitchen glipiZIDE (GLUCOTROL) 10 MG tablet Take 10 mg by mouth daily.       . Loperamide HCl (IMODIUM PO) Take 1 tablet by mouth daily as needed. For diarrhea      . metFORMIN (GLUCOPHAGE) 500 MG tablet Take 500 mg by mouth 2 (two) times daily with a meal.       . metoprolol (LOPRESSOR) 50 MG tablet Take 1 tablet (50 mg total) by mouth 2 (two) times daily.  60 tablet  1  . pseudoephedrine-guaifenesin (MUCINEX D) 60-600 MG per tablet Take 1 tablet by mouth as needed. Congestion      . ramipril (ALTACE) 10 MG tablet Take 10 mg by mouth daily.       . temazepam (RESTORIL) 15 MG capsule Take 15 mg by mouth at bedtime as needed. For sleep      . warfarin (COUMADIN) 2.5 MG tablet Take 2.5 mg by mouth daily at 6 PM. TAKE AS DIRECTED         Physical Exam: BP 121/59  Pulse 62  Resp 18  Ht  5\' 11"  (1.803 m)  Wt 276 lb 14.4 oz (125.6 kg)  BMI 38.62 kg/m2  SpO2 90% He looks well. He has lost considerable weight since surgery. Cardiac exam shows regular rate and rhythm with normal heart sounds. Lung exam is clear. The chest incision is well healed and sternum is stable. There is a strong palpable right carotid, brachial, and radial pulse. There is no peripheral edema. There is no significant edema in the left arm.  Impression/Plan:  I think he is making reasonably good progress over the past 4 months considering his preoperative physical condition. I would expect his cognitive state to continue improving over the first year after surgery. His left arm has no significant residual edema following subclavian DVT and I think he could discontinue his Coumadin at this time. I am concerned about his chest discomfort radiating up into his neck. This could be angina although it is not exertionally related. He is going to followup with Dr. Tamala Julian in the near future concerning this. I told him that I did not need to see him back in followup  since everything is healing well. He will continue following up with Dr. Tamala Julian and Dr.Fields from vascular surgery.

## 2012-01-27 ENCOUNTER — Encounter (HOSPITAL_COMMUNITY): Payer: Medicare Other

## 2012-01-27 ENCOUNTER — Encounter (HOSPITAL_COMMUNITY)
Admission: RE | Admit: 2012-01-27 | Discharge: 2012-01-27 | Disposition: A | Discharge status: Home / Self Care | Payer: Medicare Other | Source: Ambulatory Visit | Attending: Interventional Cardiology | Admitting: Interventional Cardiology

## 2012-01-27 DIAGNOSIS — Z5189 Encounter for other specified aftercare: Secondary | ICD-10-CM | POA: Insufficient documentation

## 2012-01-27 DIAGNOSIS — E119 Type 2 diabetes mellitus without complications: Secondary | ICD-10-CM | POA: Insufficient documentation

## 2012-01-27 DIAGNOSIS — Z86718 Personal history of other venous thrombosis and embolism: Secondary | ICD-10-CM | POA: Insufficient documentation

## 2012-01-27 DIAGNOSIS — I6529 Occlusion and stenosis of unspecified carotid artery: Secondary | ICD-10-CM | POA: Insufficient documentation

## 2012-01-27 DIAGNOSIS — I2510 Atherosclerotic heart disease of native coronary artery without angina pectoris: Secondary | ICD-10-CM | POA: Insufficient documentation

## 2012-01-27 DIAGNOSIS — Z951 Presence of aortocoronary bypass graft: Secondary | ICD-10-CM | POA: Insufficient documentation

## 2012-01-27 NOTE — Progress Notes (Signed)
Patient returned to cardiac rehab per Dr Tamala Julian.  No complaints voiced today during exercise. Will continue to monitor the patient throughout  the program.

## 2012-01-29 ENCOUNTER — Encounter (HOSPITAL_COMMUNITY)
Admission: RE | Admit: 2012-01-29 | Discharge: 2012-01-29 | Disposition: A | Discharge status: Home / Self Care | Payer: Medicare Other | Source: Ambulatory Visit | Attending: Interventional Cardiology | Admitting: Interventional Cardiology

## 2012-01-29 ENCOUNTER — Encounter (HOSPITAL_COMMUNITY): Payer: Medicare Other

## 2012-02-01 ENCOUNTER — Encounter (HOSPITAL_COMMUNITY)
Admission: RE | Admit: 2012-02-01 | Discharge: 2012-02-01 | Disposition: A | Discharge status: Home / Self Care | Payer: Medicare Other | Source: Ambulatory Visit | Attending: Interventional Cardiology | Admitting: Interventional Cardiology

## 2012-02-03 ENCOUNTER — Encounter (HOSPITAL_COMMUNITY)
Admission: RE | Admit: 2012-02-03 | Discharge: 2012-02-03 | Disposition: A | Discharge status: Home / Self Care | Payer: Medicare Other | Source: Ambulatory Visit | Attending: Interventional Cardiology | Admitting: Interventional Cardiology

## 2012-02-03 NOTE — Progress Notes (Signed)
[name] has a follow up appointment with Dr Laurann Montana tomorrow. Faxed [name]'s exercise flow sheets to Dr Delene Ruffini office for review. [name]'s morning blood sugars have been above 200 recently.

## 2012-02-05 ENCOUNTER — Encounter (HOSPITAL_COMMUNITY)
Admission: RE | Admit: 2012-02-05 | Discharge: 2012-02-05 | Disposition: A | Discharge status: Home / Self Care | Payer: Medicare Other | Source: Ambulatory Visit | Attending: Interventional Cardiology | Admitting: Interventional Cardiology

## 2012-02-08 ENCOUNTER — Encounter (HOSPITAL_COMMUNITY)
Admission: RE | Admit: 2012-02-08 | Discharge: 2012-02-08 | Disposition: A | Discharge status: Home / Self Care | Payer: Medicare Other | Source: Ambulatory Visit | Attending: Interventional Cardiology | Admitting: Interventional Cardiology

## 2012-02-10 ENCOUNTER — Encounter (HOSPITAL_COMMUNITY)
Admission: RE | Admit: 2012-02-10 | Discharge: 2012-02-10 | Disposition: A | Discharge status: Home / Self Care | Payer: Medicare Other | Source: Ambulatory Visit | Attending: Interventional Cardiology | Admitting: Interventional Cardiology

## 2012-02-12 ENCOUNTER — Encounter (HOSPITAL_COMMUNITY)
Admission: RE | Admit: 2012-02-12 | Discharge: 2012-02-12 | Disposition: A | Discharge status: Home / Self Care | Payer: Medicare Other | Source: Ambulatory Visit | Attending: Interventional Cardiology | Admitting: Interventional Cardiology

## 2012-02-15 ENCOUNTER — Encounter (HOSPITAL_COMMUNITY)
Admission: RE | Admit: 2012-02-15 | Discharge: 2012-02-15 | Disposition: A | Discharge status: Home / Self Care | Payer: Medicare Other | Source: Ambulatory Visit | Attending: Interventional Cardiology | Admitting: Interventional Cardiology

## 2012-02-15 LAB — GLUCOSE, CAPILLARY: Glucose-Capillary: 184 mg/dL — ABNORMAL HIGH (ref 70–99)

## 2012-02-17 ENCOUNTER — Encounter (HOSPITAL_COMMUNITY)
Admission: RE | Admit: 2012-02-17 | Discharge: 2012-02-17 | Disposition: A | Discharge status: Home / Self Care | Payer: Medicare Other | Source: Ambulatory Visit | Attending: Interventional Cardiology | Admitting: Interventional Cardiology

## 2012-02-19 ENCOUNTER — Encounter (HOSPITAL_COMMUNITY)
Admission: RE | Admit: 2012-02-19 | Discharge: 2012-02-19 | Disposition: A | Discharge status: Home / Self Care | Payer: Medicare Other | Source: Ambulatory Visit | Attending: Interventional Cardiology | Admitting: Interventional Cardiology

## 2012-02-19 ENCOUNTER — Encounter (HOSPITAL_COMMUNITY): Payer: Medicare Other

## 2012-02-19 NOTE — Progress Notes (Signed)
Dr Marlou Porch and Bennye Alm ANP reviewed [name]'s 12 lead ECG. Dr Kingsley Plan said [name] can go home. An appointment was given for [name] to see Bennye Alm ANP on Tuesday morning at 0930 via the phone. [name] did not exercise this afternoon and was instructed not to return to exercise until he receives a note from Dr [employer] office on when to return to exercise.  Patient instructed to go the ED if he develops any chest pain or worrisome symptoms.  [name] remained symptom free upon exit from cardiac rehab. Exit blood pressure 132/70 heart rate 61. Patients wife called and notified of  Today's events. Will continue to monitor the patient throughout  the program.

## 2012-02-19 NOTE — Progress Notes (Signed)
[name] was noted to have an inverted t wave this afternoon.  [name] denies complaints or chest pain. Previous ECG tracings [name]'s T wave are not inverted.  ECG tracing's from today faxed to Dr Kingsley Plan office for review.  Dr Kingsley Plan said to get a 12 lead ECG.  12 lead ECG obtained and faxed to Dr Kingsley Plan office for review.

## 2012-02-22 ENCOUNTER — Encounter (HOSPITAL_COMMUNITY): Payer: Medicare Other

## 2012-02-24 ENCOUNTER — Encounter (HOSPITAL_COMMUNITY)
Admission: RE | Admit: 2012-02-24 | Discharge: 2012-02-24 | Disposition: A | Discharge status: Home / Self Care | Payer: Medicare Other | Source: Ambulatory Visit | Attending: Interventional Cardiology | Admitting: Interventional Cardiology

## 2012-02-24 DIAGNOSIS — Z5189 Encounter for other specified aftercare: Secondary | ICD-10-CM | POA: Insufficient documentation

## 2012-02-24 DIAGNOSIS — I2510 Atherosclerotic heart disease of native coronary artery without angina pectoris: Secondary | ICD-10-CM | POA: Insufficient documentation

## 2012-02-24 DIAGNOSIS — E119 Type 2 diabetes mellitus without complications: Secondary | ICD-10-CM | POA: Insufficient documentation

## 2012-02-24 DIAGNOSIS — I6529 Occlusion and stenosis of unspecified carotid artery: Secondary | ICD-10-CM | POA: Insufficient documentation

## 2012-02-24 DIAGNOSIS — Z951 Presence of aortocoronary bypass graft: Secondary | ICD-10-CM | POA: Insufficient documentation

## 2012-02-24 DIAGNOSIS — Z86718 Personal history of other venous thrombosis and embolism: Secondary | ICD-10-CM | POA: Insufficient documentation

## 2012-02-26 ENCOUNTER — Encounter (HOSPITAL_COMMUNITY)
Admission: RE | Admit: 2012-02-26 | Discharge: 2012-02-26 | Disposition: A | Discharge status: Home / Self Care | Payer: Medicare Other | Source: Ambulatory Visit | Attending: Interventional Cardiology | Admitting: Interventional Cardiology

## 2012-02-26 LAB — GLUCOSE, CAPILLARY: Glucose-Capillary: 174 mg/dL — ABNORMAL HIGH (ref 70–99)

## 2012-02-26 NOTE — Progress Notes (Signed)
Hal graduates today and plans to continue exercise on his own.  Jarian may consider the maintenance program in the future.

## 2012-04-13 ENCOUNTER — Encounter: Payer: Self-pay | Admitting: Vascular Surgery

## 2012-04-14 ENCOUNTER — Other Ambulatory Visit: Payer: Medicare Other

## 2012-04-14 ENCOUNTER — Ambulatory Visit (INDEPENDENT_AMBULATORY_CARE_PROVIDER_SITE_OTHER): Payer: Medicare Other | Admitting: Vascular Surgery

## 2012-04-14 ENCOUNTER — Telehealth: Payer: Self-pay | Admitting: Vascular Surgery

## 2012-04-14 ENCOUNTER — Encounter: Payer: Self-pay | Admitting: Vascular Surgery

## 2012-04-14 ENCOUNTER — Other Ambulatory Visit (INDEPENDENT_AMBULATORY_CARE_PROVIDER_SITE_OTHER): Payer: Medicare Other | Admitting: *Deleted

## 2012-04-14 VITALS — BP 124/54 | HR 56 | Ht 71.0 in | Wt 278.2 lb

## 2012-04-14 DIAGNOSIS — R4189 Other symptoms and signs involving cognitive functions and awareness: Secondary | ICD-10-CM

## 2012-04-14 DIAGNOSIS — I739 Peripheral vascular disease, unspecified: Secondary | ICD-10-CM

## 2012-04-14 DIAGNOSIS — I771 Stricture of artery: Secondary | ICD-10-CM

## 2012-04-14 DIAGNOSIS — R413 Other amnesia: Secondary | ICD-10-CM

## 2012-04-14 DIAGNOSIS — Z48812 Encounter for surgical aftercare following surgery on the circulatory system: Secondary | ICD-10-CM

## 2012-04-14 DIAGNOSIS — I6529 Occlusion and stenosis of unspecified carotid artery: Secondary | ICD-10-CM

## 2012-04-14 NOTE — Progress Notes (Signed)
Patient is a 71 year old male who has previously undergone bilateral carotid endarterectomies and aorta to innominate bypass. This was in April of 2013. He returns today for followup. He denies any symptoms of TIA amaurosis or stroke. He has had some forgetfulness. He also complains of morning headaches. He has also had some chest pain recently and this was discussed with Dr. Pernell Dupre his cardiologist.  Review of systems: He has shortness of breath with exertion. Chest pain as described above.  Physical exam: Filed Vitals:   04/14/12 1213 04/14/12 1216  BP: 131/50 124/54  Pulse: 56   Height: 5\' 11"  (1.803 m)   Weight: 278 lb 3.2 oz (126.191 kg)   SpO2: 99%    Extremities: 2+ radial pulse right 1+ radial pulse left  Neck: No carotid bruits well-healed incisions  Chest: Clear to auscultation  Cardiac: Regular rate and rhythm  Data: The patient had a carotid duplex exam today which showed evidence of left subclavian artery stenosis. The aorto innominate bypass graft was patent but with some possible proximal narrowing. Bilateral carotid endarterectomies were widely patent.  Assessment: #1 some forgetfulness. We will refer to Dr. Katherina Right with neurology for a cognitive evaluation  #2 morning headaches. The patient most likely has sleep apnea. I offered him a sleep study and possible use of CPAP to improve this. He states that he is not bothered enough by the headaches to consider this. #3 possible proximal narrowing of his aorto innominate bypass. We will arrange a CT Angio the neck and chest to evaluate this. He will followup in 2 weeks.  Ruta Hinds, MD [facility] Office: [phone] Pager: [phone]

## 2012-04-14 NOTE — Telephone Encounter (Addendum)
Message copied by Lujean Amel on Thu Apr 14, 2012  4:12 PM ------      Message from: Ruta Hinds E      Created: Thu Apr 14, 2012  2:38 PM       Can you schedule this patient for eval by Dr Erling Cruz for cognitive function and memory problems.            Thanks            Juanda Crumble  I faxed a referral to Diane @ [facility] for this pt at [facility]'s request. They usually schedule an appt and then contact the pt. Pt is aware. awt

## 2012-04-14 NOTE — Addendum Note (Signed)
Addended by: Mena Goes on: 04/14/2012 03:31 PM   Modules accepted: Orders

## 2012-04-14 NOTE — Addendum Note (Signed)
Addended by: Mena Goes on: 04/14/2012 02:54 PM   Modules accepted: Orders

## 2012-04-18 ENCOUNTER — Other Ambulatory Visit: Payer: Self-pay | Admitting: Vascular Surgery

## 2012-04-18 LAB — CREATININE, SERUM: Creat: 1.32 mg/dL (ref 0.50–1.35)

## 2012-04-18 LAB — BUN: BUN: 27 mg/dL — ABNORMAL HIGH (ref 6–23)

## 2012-04-28 ENCOUNTER — Ambulatory Visit: Payer: Medicare Other | Admitting: Vascular Surgery

## 2012-05-04 ENCOUNTER — Encounter: Payer: Self-pay | Admitting: Vascular Surgery

## 2012-05-05 ENCOUNTER — Encounter: Payer: Self-pay | Admitting: Vascular Surgery

## 2012-05-05 ENCOUNTER — Ambulatory Visit
Admission: RE | Admit: 2012-05-05 | Discharge: 2012-05-05 | Disposition: A | Discharge status: Home / Self Care | Payer: Medicare Other | Source: Ambulatory Visit | Attending: Vascular Surgery | Admitting: Vascular Surgery

## 2012-05-05 ENCOUNTER — Ambulatory Visit (INDEPENDENT_AMBULATORY_CARE_PROVIDER_SITE_OTHER): Payer: Medicare Other | Admitting: Vascular Surgery

## 2012-05-05 VITALS — BP 125/44 | HR 62 | Resp 18 | Ht 71.0 in | Wt 282.0 lb

## 2012-05-05 DIAGNOSIS — I6529 Occlusion and stenosis of unspecified carotid artery: Secondary | ICD-10-CM

## 2012-05-05 DIAGNOSIS — Z48812 Encounter for surgical aftercare following surgery on the circulatory system: Secondary | ICD-10-CM

## 2012-05-05 DIAGNOSIS — I771 Stricture of artery: Secondary | ICD-10-CM

## 2012-05-05 MED ORDER — IOHEXOL 350 MG/ML SOLN
100.0000 mL | Freq: Once | INTRAVENOUS | Status: AC | PRN
  Administered 2012-05-05: 100 mL via INTRAVENOUS

## 2012-05-05 NOTE — Progress Notes (Signed)
Patient is a 71 year old male who has previously undergone bilateral carotid endarterectomies and aorta to innominate bypass. This was in April of 2013. He returns today for followup after CT angiogram to evaluate the proximal anastomosis. This was done for a carotid duplex that suggested some decreased inflow. He denies any symptoms of TIA amaurosis or stroke. He has had some forgetfulness. He also complains of morning headaches. He has also had some chest pain recently and this was discussed with Dr. Pernell Dupre his cardiologist.   Review of systems: He has shortness of breath with exertion. Chest pain as described above.  Physical exam:  Filed Vitals:   05/05/12 1307  BP: 125/44  Pulse: 62  Resp: 18  Height: 5\' 11"  (1.803 m)  Weight: 282 lb (127.914 kg)  SpO2: 98%    Extremities: 2+ radial pulse right 1+ radial pulse left  Neck: No carotid bruits well-healed incisions, 2+ carotid pulses bilaterally  Data: I reviewed the images from the patient's CT Angio today. This shows widely patent carotid bifurcations. The distal aspect of aorto innominate graft is patent. The proximal aspect of the graft is not visualized because the CT did not scan far enough inferiorly.  There is a 30% stenosis of the left common carotid artery there was mild bilateral subclavian artery stenosis  Assessment: Patent aorto innominate bypass but nonvisualization of the proximal aspect of the bypass.  Plan: I discussed these findings with the patient. I spoke with the department of radiology regarding CT Angio. They will schedule him for a CT Angio the chest next week so that we visualized the entire aspect of the graft. I will call the patient with the results of the CT.  If the CT has no significant findings he will have a followup carotid duplex in 6 months.  Ruta Hinds, MD  [facility] [employer]  Office: [phone]  Pager: [phone]

## 2012-05-09 ENCOUNTER — Other Ambulatory Visit: Payer: Self-pay | Admitting: Diagnostic Neuroimaging

## 2012-05-09 DIAGNOSIS — R413 Other amnesia: Secondary | ICD-10-CM

## 2012-05-11 ENCOUNTER — Other Ambulatory Visit: Payer: Self-pay | Admitting: *Deleted

## 2012-05-11 DIAGNOSIS — Z48812 Encounter for surgical aftercare following surgery on the circulatory system: Secondary | ICD-10-CM

## 2012-05-20 ENCOUNTER — Other Ambulatory Visit: Payer: Medicare Other

## 2012-05-26 ENCOUNTER — Ambulatory Visit
Admission: RE | Admit: 2012-05-26 | Discharge: 2012-05-26 | Disposition: A | Discharge status: Home / Self Care | Payer: Medicare Other | Source: Ambulatory Visit | Attending: Diagnostic Neuroimaging | Admitting: Diagnostic Neuroimaging

## 2012-05-26 ENCOUNTER — Telehealth: Payer: Self-pay | Admitting: Vascular Surgery

## 2012-05-26 ENCOUNTER — Ambulatory Visit
Admission: RE | Admit: 2012-05-26 | Discharge: 2012-05-26 | Disposition: A | Discharge status: Home / Self Care | Payer: Medicare Other | Source: Ambulatory Visit | Attending: Vascular Surgery | Admitting: Vascular Surgery

## 2012-05-26 DIAGNOSIS — Z48812 Encounter for surgical aftercare following surgery on the circulatory system: Secondary | ICD-10-CM

## 2012-05-26 DIAGNOSIS — R413 Other amnesia: Secondary | ICD-10-CM

## 2012-05-26 MED ORDER — IOHEXOL 350 MG/ML SOLN
100.0000 mL | Freq: Once | INTRAVENOUS | Status: AC | PRN
  Administered 2012-05-26: 100 mL via INTRAVENOUS

## 2012-05-26 NOTE — Telephone Encounter (Signed)
Sent letter to pt re: appt, dpm

## 2012-05-26 NOTE — Telephone Encounter (Signed)
Gave pt results of CT.  Aorto innominate patent.  Some compression of innominate vein asymptomatic.  Needs office visit and repeat duplex in 6 months.  Ruta Hinds, MD [facility] [place] Office: [phone] Pager: [phone]

## 2012-05-26 NOTE — Telephone Encounter (Signed)
Message copied by Gena Fray on Thu May 26, 2012  3:57 PM ------      Message from: [name]: Thu May 26, 2012  3:44 PM       Please schedule pt for follow up visit with me not [name]  and carotid duplex in 6 months.            [name]

## 2012-06-13 ENCOUNTER — Other Ambulatory Visit: Payer: Self-pay | Admitting: *Deleted

## 2012-06-13 DIAGNOSIS — I6529 Occlusion and stenosis of unspecified carotid artery: Secondary | ICD-10-CM

## 2012-09-19 ENCOUNTER — Ambulatory Visit: Payer: Self-pay | Admitting: Diagnostic Neuroimaging

## 2012-11-30 ENCOUNTER — Encounter: Payer: Self-pay | Admitting: Vascular Surgery

## 2012-12-01 ENCOUNTER — Other Ambulatory Visit (INDEPENDENT_AMBULATORY_CARE_PROVIDER_SITE_OTHER): Payer: Medicare Other | Admitting: *Deleted

## 2012-12-01 ENCOUNTER — Encounter: Payer: Self-pay | Admitting: Vascular Surgery

## 2012-12-01 ENCOUNTER — Ambulatory Visit (INDEPENDENT_AMBULATORY_CARE_PROVIDER_SITE_OTHER): Payer: Medicare Other | Admitting: Vascular Surgery

## 2012-12-01 DIAGNOSIS — I6529 Occlusion and stenosis of unspecified carotid artery: Secondary | ICD-10-CM

## 2012-12-01 NOTE — Progress Notes (Signed)
[facility] [place] HISTORY AND PHYSICAL   CC:  Follow up  Anthony Shelling, MD  HPI: This is a 72 y.o. male  Who previously underwent bilateral CEA and aorta to innominate bypass in April 2013.  He returns today for f/u carotid duplex.  Pt states that he has a couple of episodes of dizziness, but denies amaurosis fugax, weakness in his arms or legs or difficulty speaking.  He does state that when he goes to sit down at the bar for breakfast, his legs will go numb, but this improves as he gets up moving around.  He states that he does have trouble remembering the words that he wants to say, but does not have trouble understanding or expressing his thoughts.  He does complain of pain in his legs while walking.  He states that he can walk about a half a block before he starts to have cramps in his legs.  This is relieved with rest.  He states that is is very limiting to his lifestyle.  He is able to do the recumbent stepper for usually 2x per week but sometimes 3 times.  He does state that he has SOB while lying flat and does have to sleep propped up on pillows.  He also complains of swelling in his legs, more so in his right leg.  He states that he was on Lasix, but this was discontinued to see if his kidney function would improve and it did.  He has recently been started back on his lasix, but now takes it every other day.    Past Medical History  Diagnosis Date  . Chest pain   . Muscle pain   . Diarrhea   . Hyperlipidemia     takes Tricor and Lipitor daily  . Hyperlipidemia   . Atherosclerotic heart disease   . PVD (peripheral vascular disease)   . Peripheral neuropathy   . Seasonal allergies     takes Mucinex and Zyrtec prn  . Nasal polyps     hx of  . Edema     right leg knee down swollen since fall 3 wks ago  . Back pain   . GERD (gastroesophageal reflux disease)     Rolaids as needed-occ reflux  . Colon polyps   . Renal insufficiency   . Urinary  frequency     urgency/takes Vesicare daily  . Kidney stone     35+yrs ago  . Pneumonia     as a child  . Hypertension     takes Altace,Amlodipine,and Nadolol daily  . Peripheral edema     wears knee length hose-told by podiatrist to wear these  . Peripheral neuropathy     takes Gabapentin daily  . Arthritis   . Bruises easily     takes Pletal daily and ASA 325mg  daily  . Hemorrhoids   . Cyst     on perineum;takes Doxycycline daily  . History of kidney stones     at age 10   . Diabetes mellitus     takes Glipizide,Metformin,and Actos daily  . Cataract immature     right   Past Surgical History  Procedure Laterality Date  . Aortobifemoral bypass    . Reimplantation of inferior mesenteric artery    . Repair of infrarenal abdominal aortic aneurysm    . Shoulder arthroscopy w/ rotator cuff repair      right and left  . Tonsillectomy      at age 60  .  Knee arthroscopy  2011  . Cataract surgery      left  . Wisdom teeth extracted      as a teenager  . Carotid endarterectomy  04/08/2011    left CEA  . Endarterectomy  04/08/2011    Procedure: ENDARTERECTOMY CAROTID;  Surgeon: Elam Dutch, MD;  Location: [facility] OR;  Service: Vascular;  Laterality: Left;  with patch angioplasty  . Cardiac catheterization  most recent 05/2011    total of 4  . Colonoscopy    . Coronary artery bypass graft  09/09/2011    Procedure: CORONARY ARTERY BYPASS GRAFTING (CABG);  Surgeon: Gaye Pollack, MD;  Location: [facility];  Service: Open Heart Surgery;  Laterality: N/A;  Coronary artery bypass grafting  x three with Right saphenous vein harvested endoscopically and left internal mammary artery  . Endarterectomy  09/09/2011    Procedure: ENDARTERECTOMY CAROTID;  Surgeon: Elam Dutch, MD;  Location: [facility] OR;  Service: Vascular;  Laterality: Right;  with patch angioplasty    Allergies  Allergen Reactions  . Amoxicillin-Pot Clavulanate Nausea Only    Current Outpatient Prescriptions  Medication Sig  Dispense Refill  . aspirin 325 MG EC tablet Take 325 mg by mouth daily.      Marland Kitchen atorvastatin (LIPITOR) 20 MG tablet Take 20 mg by mouth daily.      Marland Kitchen b complex vitamins capsule Take 1 capsule by mouth daily.        . cilostazol (PLETAL) 100 MG tablet Take 100 mg by mouth 2 (two) times daily.       . Cyanocobalamin (VITAMIN B 12 PO) Take 1 tablet by mouth every other day.      . fenofibrate (TRICOR) 145 MG tablet Take 1 tablet (145 mg total) by mouth daily.  30 tablet  0  . furosemide (LASIX) 40 MG tablet TAKE ONE TABLET BY MOUTH IN THE MORNING  30 tablet  0  . gabapentin (NEURONTIN) 300 MG capsule Take 300 mg by mouth 2 (two) times daily.       Marland Kitchen glipiZIDE (GLUCOTROL) 10 MG tablet Take 10 mg by mouth daily.       . metFORMIN (GLUCOPHAGE) 500 MG tablet Take 500 mg by mouth 2 (two) times daily with a meal.       . NITROSTAT 0.4 MG SL tablet Take 1 tablet by mouth.      . pioglitazone (ACTOS) 15 MG tablet Take 1 tablet by mouth daily.      . potassium chloride SA (K-DUR,KLOR-CON) 20 MEQ tablet Take 1 tablet by mouth daily.      . ramipril (ALTACE) 10 MG tablet Take 10 mg by mouth daily.       . Cetirizine HCl (ZYRTEC ALLERGY PO) Take 1 tablet by mouth daily as needed. For allergies      . ferrous gluconate (FERGON) 325 MG tablet Take 325 mg by mouth 2 (two) times daily.       . Loperamide HCl (IMODIUM PO) Take 1 tablet by mouth daily as needed. For diarrhea      . metoprolol (LOPRESSOR) 50 MG tablet Take 1 tablet (50 mg total) by mouth 2 (two) times daily.  60 tablet  1  . pseudoephedrine-guaifenesin (MUCINEX D) 60-600 MG per tablet Take 1 tablet by mouth as needed. Congestion      . temazepam (RESTORIL) 15 MG capsule Take 15 mg by mouth at bedtime as needed. For sleep      . warfarin (COUMADIN) 2.5 MG  tablet Take 2.5 mg by mouth daily at 6 PM. TAKE AS DIRECTED       No current facility-administered medications for this visit.    Family History  Problem Relation Age of Onset  . Heart disease  Mother   . Heart disease Father   . Anesthesia problems Neg Hx   . Hypotension Neg Hx   . Malignant hyperthermia Neg Hx   . Pseudochol deficiency Neg Hx     History   Social History  . Marital Status: Married    Spouse Name: N/A    Number of Children: N/A  . Years of Education: N/A   Occupational History  . Not on file.   Social History Main Topics  . Smoking status: Former Smoker -- 40 years    Types: Cigarettes    Quit date: 10/27/2005  . Smokeless tobacco: Not on file  . Alcohol Use: 0.0 oz/week    2-3 Cans of beer per week     Comment: occasional  . Drug Use: No  . Sexually Active: No   Other Topics Concern  . Not on file   Social History Narrative  . No narrative on file     ROS: [x]  Positive   [ ]  Negative   [ ]  All sytems reviewed and are negative  Cardiovascular: []  chest pain/pressure []  palpitations []  SOB lying flat []  DOE [x]  pain in legs while walking-can walk about a half a block and pain is relieved with rest []  pain in feet when lying flat []  hx of DVT []  hx of phlebitis [x]  swelling in legs-right >left []  varicose veins  Pulmonary: []  productive cough []  asthma []  wheezing  Neurologic: []  weakness in []  arms []  legs []  numbness in []  arms []  legs [] difficulty speaking or slurred speech []  temporary loss of vision in one eye []  dizziness  Hematologic: []  bleeding problems []  problems with blood clotting easily  GI []  vomiting blood []  blood in stool  GU: []  burning with urination []  blood in urine  Psychiatric: []  hx of major depression  Integumentary: []  rashes []  ulcers  Constitutional: []  fever []  chills   PHYSICAL EXAMINATION:  Filed Vitals:   12/01/12 1433  BP: 175/57  Pulse:    Body mass index is 40.91 kg/(m^2).  General:  WDWN obese in NAD Gait: Not observed HENT: WNL, normocephalic Eyes: Pupils equal Pulmonary: normal non-labored breathing , without Rales, rhonchi,  wheezing Cardiac: RRR,  without  Murmurs, rubs or gallops; without carotid bruits Abdomen: soft, NT, no masses Skin: without rashes, without ulcers  Vascular Exam/Pulses:+ palpable bilateral radial pulses;  + palpable right DP; diminished doppler signal in the left DP/PT Extremities: without ischemic changes, without Gangrene , without cellulitis; without open wounds;  Musculoskeletal: no muscle wasting or atrophy  Neurologic: A&O X 3; Appropriate Affect ; SENSATION: normal; MOTOR FUNCTION:  moving all extremities equally. Speech is fluent/normal   Non-Invasive Vascular Imaging:  Carotid duplex scan 12/01/12 1.  Patent bilateral carotid endarterectomy sites with no internal carotid artery stenoses 2.  Patent aorto-innominate bypass great, based on limited visualization   Compared to previous exam: No significant change in the bilateral carotid arteries when compared to the previous exam and 04/14/12.  Velocities of greater than 200cm/s and greater than 400cm/s were not detected near the aorto-innominate bypass graft and elft subclavian artery, respectively, as seen on the previous exam.  Additional Findings: 1.  The aorto-innoinate bypass graft was not adequately visualized due to anatomic location  however the patent right proximal subclavian and common carotid arteries indicate graft patency. 2.  Velocity of 286cm/s noted in the left proximal subclavian artery. 3.  No significant stenosis in the bilateral external or common carotid arteries.   ASSESSMENT/PLAN: 72 y.o. male s/p bilateral carotid endarterectomies and aorta to innominate bypass in April 2013.    -pt is doing well from carotid endarterectomies and aorto innominate bypass grafting.  Will have him f/u in 6 months with carotid duplex scan.  -he does have significant claudication, but does not have rest pain or any tissue loss.  When he returns in 6 months, we will obtain ABI's and a lower extremity arterial duplex scan to re-evaluate his PVD.  He also  had aortobifemoral bypass grafting in 2007.   Reviewing the op note, there were bilateral groin incisions made.    -pt may need arteriogram in the future-pt states that he does have some kidney disease that is being managed.   Leontine Locket, PA-C [facility] [phone]  Clinic MD:  Pt seen and examined in conjunction with Dr. Oneida Alar  History and exam as above.  His aortoinnominate bypass is patent.  His carotids have no restenosis.  He does have bilateral claudication which limits his lifestyle.  He was reassured that he is not at risk of limb loss. Will get arterial duplex with next office visit and consider leg intervention.  He is not a ideal candidate for bypass due to his obesity.  Ruta Hinds, MD [facility] [place] Office: [phone] Pager: [phone]   VASCULAR QUALITY INITIATIVE FOLLOW UP DATA:  Current smoker: [  ] yes  [  x] no  Living status: [  x]  Home  [  ] Nursing home  [  ] Homeless    MEDS:  ASA [ x ] yes  [  ] no- [  ] medical reason  [  ] non compliant  STATIN  [x  ] yes  [  ] no- [  ] medical reason  [  ] non compliant  Beta blocker [  ] yes  [  x] no- [  ] medical reason  [  ] non compliant  ACE inhibitor [  ] yes  [  ] no- [  ] medical reason  [  ] non compliant  P2Y12 Antagonist [ x ] none  [  ] clopidogrel-Plavix  [  ] ticlopidine-Ticlid   [  ] prasugrel-Effient  [  ] ticagrelor- Brilinta    Anticoagulant [ x ] None  [  ] warfarin  [  ] rivaroxaban-Xarelto [  ] dabigatran- Pradaxa  Neurologic event since D/C:  [ x ] no  [  ] yes: [  ] eye event  [  ] cortical event  [  ] VB event  [  ] non specific event  [  ] right  [  ] left  [  ] TIA  [  ] stroke  Date:   Modified Rankin Score: 0  MI since D/C: [ x ] no  [  ] troponin only  [  ] EKG or clinical  Cranial nerve injury: [ x ] none  [  ] resolved  [  ] persistent  Duplex CEA site: [  ] no  [ x ] yes - PSV= see report  EDV= see report  ICA/CCA  ratio: right 0.78 left 0.74  Stenosis= [x  ] <40% [  ] 40-59% [  ]  60-79%  [  ] > 80%  [  ]  Occluded  CEA site re-operation:  [x  ] no   [  ] yes- date of re-op:  CEA site PCI:   [x  ] no   [  ] yes- date of PCI:

## 2012-12-02 ENCOUNTER — Other Ambulatory Visit: Payer: Self-pay | Admitting: *Deleted

## 2012-12-02 DIAGNOSIS — Z48812 Encounter for surgical aftercare following surgery on the circulatory system: Secondary | ICD-10-CM

## 2012-12-02 DIAGNOSIS — I739 Peripheral vascular disease, unspecified: Secondary | ICD-10-CM

## 2013-01-16 IMAGING — CR DG CHEST 1V PORT
1 series · 1 of 1 positions shown · non-contrast
Comparison: Portable chest x-ray of 09/17/2011

CLINICAL DATA: Post CABG

PORTABLE CHEST - 1 VIEW

[AP]
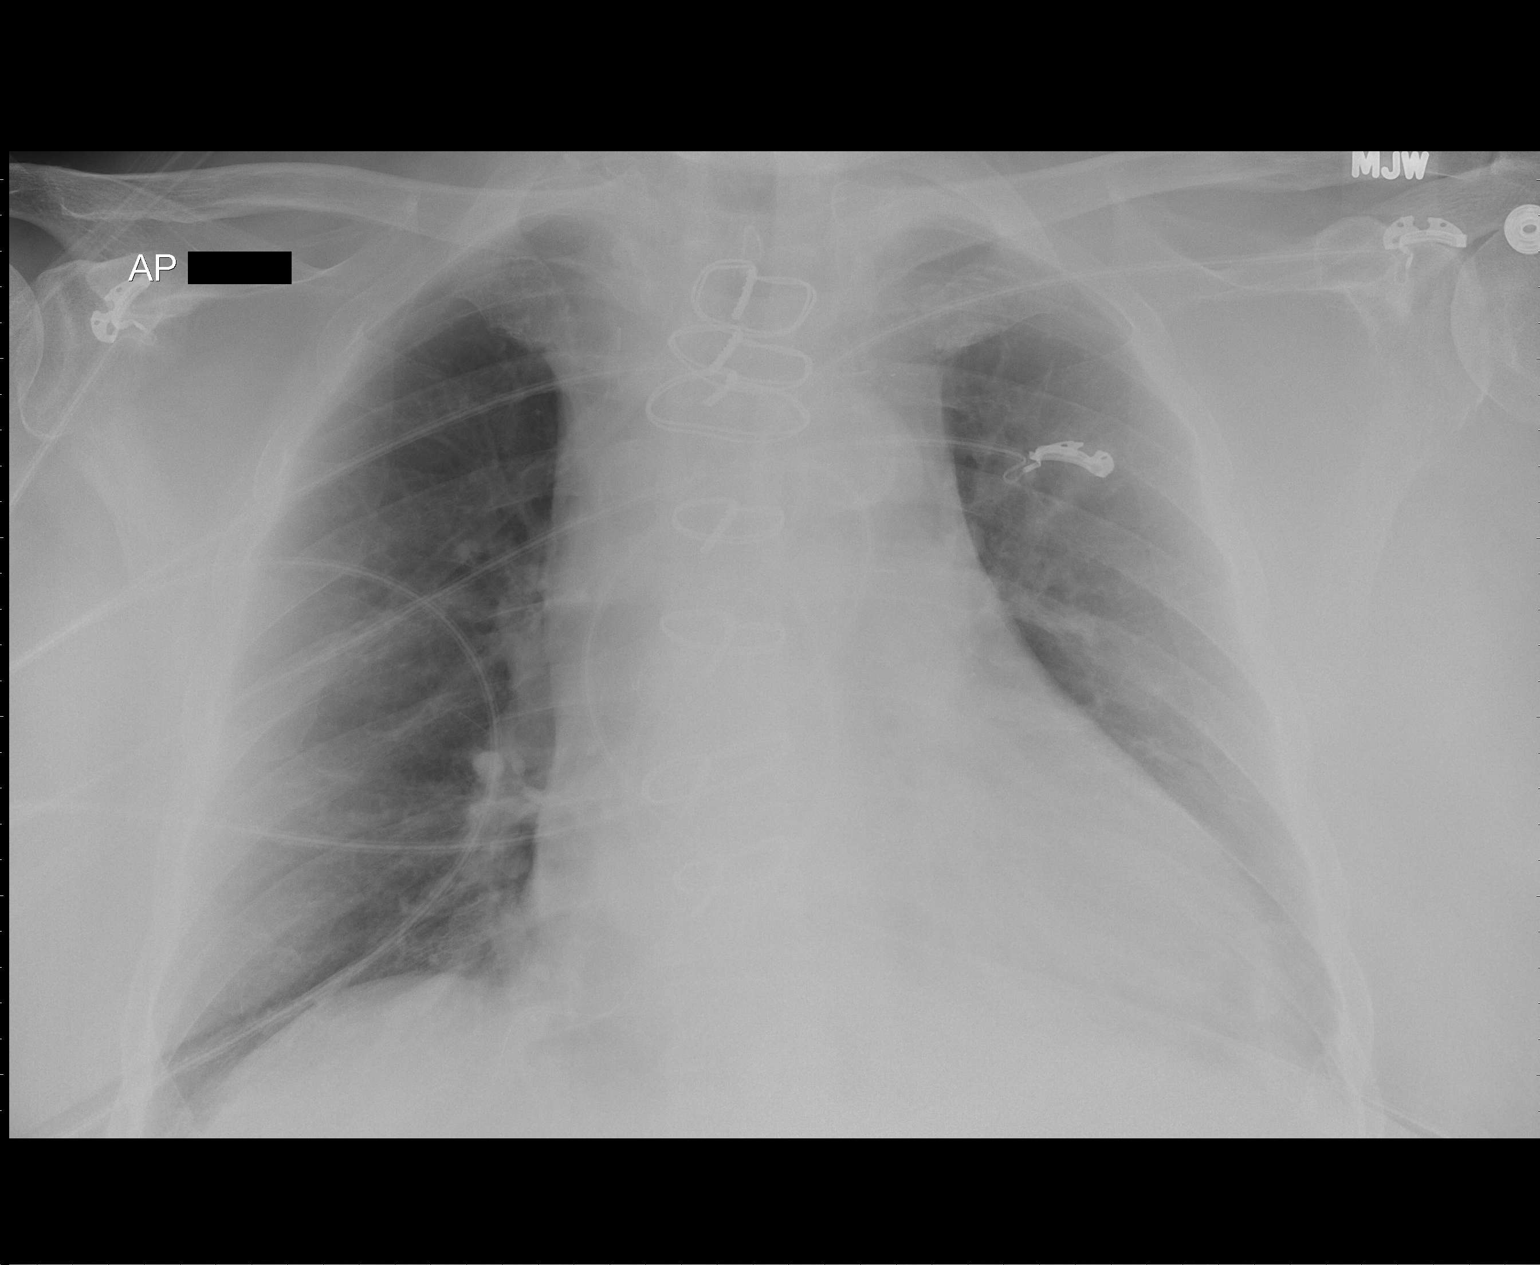

[1 of 1 positions shown; findings below may reference images not displayed]

FINDINGS: The lungs are better aerated.  Cardiomegaly is stable.
There may be mild atelectasis at the lung bases.  Median sternotomy
sutures again are noted.
IMPRESSION: Improved aeration.  Only minimal basilar atelectasis remains.

## 2013-05-22 ENCOUNTER — Other Ambulatory Visit: Payer: Self-pay | Admitting: Interventional Cardiology

## 2013-05-22 ENCOUNTER — Other Ambulatory Visit: Payer: Self-pay

## 2013-05-22 MED ORDER — POTASSIUM CHLORIDE CRYS ER 20 MEQ PO TBCR: EXTENDED_RELEASE_TABLET | ORAL | Status: DC

## 2013-06-07 ENCOUNTER — Encounter: Payer: Self-pay | Admitting: Vascular Surgery

## 2013-06-08 ENCOUNTER — Encounter: Payer: Self-pay | Admitting: Vascular Surgery

## 2013-06-08 ENCOUNTER — Encounter (INDEPENDENT_AMBULATORY_CARE_PROVIDER_SITE_OTHER): Payer: Self-pay

## 2013-06-08 ENCOUNTER — Ambulatory Visit (INDEPENDENT_AMBULATORY_CARE_PROVIDER_SITE_OTHER)
Admission: RE | Admit: 2013-06-08 | Discharge: 2013-06-08 | Disposition: A | Discharge status: Home / Self Care | Payer: Medicare Other | Source: Ambulatory Visit | Attending: Vascular Surgery | Admitting: Vascular Surgery

## 2013-06-08 ENCOUNTER — Ambulatory Visit (HOSPITAL_COMMUNITY)
Admission: RE | Admit: 2013-06-08 | Discharge: 2013-06-08 | Disposition: A | Discharge status: Home / Self Care | Payer: Medicare Other | Source: Ambulatory Visit | Attending: Vascular Surgery | Admitting: Vascular Surgery

## 2013-06-08 ENCOUNTER — Ambulatory Visit (INDEPENDENT_AMBULATORY_CARE_PROVIDER_SITE_OTHER): Payer: Medicare Other | Admitting: Vascular Surgery

## 2013-06-08 VITALS — BP 121/56 | HR 49 | Ht 71.0 in | Wt 295.0 lb

## 2013-06-08 DIAGNOSIS — I658 Occlusion and stenosis of other precerebral arteries: Secondary | ICD-10-CM | POA: Insufficient documentation

## 2013-06-08 DIAGNOSIS — I6529 Occlusion and stenosis of unspecified carotid artery: Secondary | ICD-10-CM

## 2013-06-08 DIAGNOSIS — I739 Peripheral vascular disease, unspecified: Secondary | ICD-10-CM | POA: Insufficient documentation

## 2013-06-08 DIAGNOSIS — Z48812 Encounter for surgical aftercare following surgery on the circulatory system: Secondary | ICD-10-CM | POA: Insufficient documentation

## 2013-06-08 NOTE — Progress Notes (Signed)
[facility] [place] HISTORY AND PHYSICAL   CC:  Follow up   Anthony Shelling, MD  HPI: This is a 73 y.o. male  Who previously underwent bilateral CEA and aorta to innominate bypass in April 2013.  He returns today for f/u carotid duplex.  He denies amaurosis fugax, weakness in his arms or legs or difficulty speaking.  He does state that when he goes to sit down at the bar for breakfast, his legs will go numb, but this improves as he gets up moving around.  he still has some memory problems but overall is improved. He does complain of pain in his legs while walking.  He states that he can walk about a half a block before he starts to have cramps in his legs.  This is relieved with rest.  He states that is is very limiting to his lifestyle.  He is able to do the recumbent stepper for usually 2x per week but sometimes 3 times. He denies rest pain. He has no open wounds.  He does state that he has SOB while lying flat and does have to sleep propped up on pillows.  He also complains of swelling in his legs, more so in his right leg.     Past Medical History   Diagnosis  Date   .  Chest pain     .  Muscle pain     .  Diarrhea     .  Hyperlipidemia         takes Tricor and Lipitor daily   .  Hyperlipidemia     .  Atherosclerotic heart disease     .  PVD (peripheral vascular disease)     .  Peripheral neuropathy     .  Seasonal allergies         takes Mucinex and Zyrtec prn   .  Nasal polyps         hx of   .  Edema         right leg knee down swollen since fall 3 wks ago   .  Back pain     .  GERD (gastroesophageal reflux disease)         Rolaids as needed-occ reflux   .  Colon polyps     .  Renal insufficiency     .  Urinary frequency         urgency/takes Vesicare daily   .  Kidney stone         35+yrs ago   .  Pneumonia         as a child   .  Hypertension         takes Altace,Amlodipine,and Nadolol daily   .  Peripheral edema         wears knee length  hose-told by podiatrist to wear these   .  Peripheral neuropathy         takes Gabapentin daily   .  Arthritis     .  Bruises easily         takes Pletal daily and ASA 325mg  daily   .  Hemorrhoids     .  Cyst         on perineum;takes Doxycycline daily   .  History of kidney stones         at age 58    .  Diabetes mellitus         takes Glipizide,Metformin,and  Actos daily   .  Cataract immature         right      Past Surgical History   Procedure  Laterality  Date   .  Aortobifemoral bypass       .  Reimplantation of inferior mesenteric artery       .  Repair of infrarenal abdominal aortic aneurysm       .  Shoulder arthroscopy w/ rotator cuff repair           right and left   .  Tonsillectomy           at age 58   .  Knee arthroscopy    2011   .  Cataract surgery           left   .  Wisdom teeth extracted           as a teenager   .  Carotid endarterectomy    04/08/2011       left CEA   .  Endarterectomy    04/08/2011       Procedure: ENDARTERECTOMY CAROTID;  Surgeon: Elam Dutch, MD;  Location: [facility] OR;  Service: Vascular;  Laterality: Left;  with patch angioplasty   .  Cardiac catheterization    most recent 05/2011       total of 4   .  Colonoscopy       .  Coronary artery bypass graft    09/09/2011       Procedure: CORONARY ARTERY BYPASS GRAFTING (CABG);  Surgeon: Gaye Pollack, MD;  Location: [facility];  Service: Open Heart Surgery;  Laterality: N/A;  Coronary artery bypass grafting  x three with Right saphenous vein harvested endoscopically and left internal mammary artery   .  Endarterectomy    09/09/2011       Procedure: ENDARTERECTOMY CAROTID;  Surgeon: Elam Dutch, MD;  Location: [facility] OR;  Service: Vascular;  Laterality: Right;  with patch angioplasty       Allergies   Allergen  Reactions   .  Amoxicillin-Pot Clavulanate  Nausea Only       Current Outpatient Prescriptions on File Prior to Visit  Medication Sig Dispense Refill  . aspirin 325 MG EC tablet Take  325 mg by mouth daily.      Marland Kitchen atorvastatin (LIPITOR) 20 MG tablet Take 20 mg by mouth daily.      Marland Kitchen b complex vitamins capsule Take 1 capsule by mouth daily.        . Cetirizine HCl (ZYRTEC ALLERGY PO) Take 1 tablet by mouth daily as needed. For allergies      . cilostazol (PLETAL) 100 MG tablet Take 100 mg by mouth 2 (two) times daily.       . Cyanocobalamin (VITAMIN B 12 PO) Take 1 tablet by mouth every other day.      . fenofibrate (TRICOR) 145 MG tablet Take 1 tablet (145 mg total) by mouth daily.  30 tablet  0  . furosemide (LASIX) 40 MG tablet TAKE ONE TABLET BY MOUTH IN THE MORNING  30 tablet  0  . gabapentin (NEURONTIN) 300 MG capsule Take 300 mg by mouth 2 (two) times daily.       Marland Kitchen glipiZIDE (GLUCOTROL) 10 MG tablet Take 10 mg by mouth daily.       . metFORMIN (GLUCOPHAGE) 500 MG tablet Take 500 mg by mouth 2 (two) times daily with a meal.       .  NITROSTAT 0.4 MG SL tablet Take 1 tablet by mouth.      . pioglitazone (ACTOS) 15 MG tablet Take 1 tablet by mouth daily.      . potassium chloride SA (K-DUR,KLOR-CON) 20 MEQ tablet Take one tablet every day when you take lasix  30 tablet  5  . pseudoephedrine-guaifenesin (MUCINEX D) 60-600 MG per tablet Take 1 tablet by mouth as needed. Congestion      . ramipril (ALTACE) 10 MG tablet Take 10 mg by mouth daily.       . temazepam (RESTORIL) 15 MG capsule Take 15 mg by mouth at bedtime as needed. For sleep      . ferrous gluconate (FERGON) 325 MG tablet Take 325 mg by mouth 2 (two) times daily.       . Loperamide HCl (IMODIUM PO) Take 1 tablet by mouth daily as needed. For diarrhea      . metoprolol (LOPRESSOR) 50 MG tablet Take 1 tablet (50 mg total) by mouth 2 (two) times daily.  60 tablet  1  . warfarin (COUMADIN) 2.5 MG tablet Take 2.5 mg by mouth daily at 6 PM. TAKE AS DIRECTED       No current facility-administered medications on file prior to visit.      Family History   Problem  Relation  Age of Onset   .  Heart disease  Mother      .  Heart disease  Father     .  Anesthesia problems  Neg Hx     .  Hypotension  Neg Hx     .  Malignant hyperthermia  Neg Hx     .  Pseudochol deficiency  Neg Hx         History      Social History   .  Marital Status:  Married       Spouse Name:  N/A       Number of Children:  N/A   .  Years of Education:  N/A      Occupational History   .  Not on file.      Social History Main Topics   .  Smoking status:  Former Smoker -- 40 years       Types:  Cigarettes       Quit date:  10/27/2005   .  Smokeless tobacco:  Not on file   .  Alcohol Use:  0.0 oz/week       2-3 Cans of beer per week         Comment: occasional   .  Drug Use:  No   .  Sexually Active:  No      Other Topics  Concern   .  Not on file      Social History Narrative   .  No narrative on file      ROS: [x]  Positive   [ ]  Negative   [ ]  All sytems reviewed and are negative  Cardiovascular: []  chest pain/pressure []  palpitations []  SOB lying flat []  DOE [x]  pain in legs while walking-can walk about a half a block and pain is relieved with rest []  pain in feet when lying flat []  hx of DVT []  hx of phlebitis [x]  swelling in legs-right >left []  varicose veins  Pulmonary: []  productive cough []  asthma []  wheezing  Neurologic: []  weakness in []  arms []  legs []  numbness in []  arms []  legs [] difficulty speaking or slurred speech []  temporary loss  of vision in one eye []  dizziness  Hematologic: []  bleeding problems []  problems with blood clotting easily  GI []  vomiting blood []  blood in stool  GU: []  burning with urination []  blood in urine  Psychiatric: []  hx of major depression  Integumentary: []  rashes []  ulcers  Constitutional: []  fever []  chills   PHYSICAL EXAMINATION:    Filed Vitals:   06/08/13 1533 06/08/13 1536  BP: 130/48 121/56  Pulse: 49   Height: 5\' 11"  (1.803 m)   Weight: 295 lb (133.811 kg)   SpO2: 99%    Body mass index is 40.91 kg/(m^2).  General:   WDWN obese in NAD Neck: No carotid bruits  Pulmonary: normal non-labored breathing , without Rales, rhonchi,  wheezing Cardiac: RRR, without  Murmurs, rubs or gallops Abdomen: soft, NT, no masses, obese Skin: without rashes, without ulcers   Vascular Exam/Pulses:+ palpable bilateral radial pulses;  no palpable pedal pulses femoral pulses difficult to palpate most likely secondary to obesity Extremities: without ischemic changes, without Gangrene , without cellulitis; without open wounds;   Musculoskeletal: no muscle wasting or atrophy       Neurologic: A&O X 3; Appropriate Affect ; SENSATION: normal; MOTOR FUNCTION:  moving all extremities equally. Speech is fluent/normal   Non-Invasive Vascular Imaging:    The patient had bilateral ABIs today as well as a carotid duplex exam and an arterial duplex of a lower extremities. I reviewed and interpreted these studies. ABIs were 0.44 on the left 0.6 on the right essentially unchanged over the last 3 years. Monophasic flow in the common femoral suggestive of aortoiliac occlusive disease. Also bilateral superficial femoral artery occlusions. Carotid duplex shows widely patent carotid endarterectomies  ASSESSMENT/PLAN: 73 y.o. male s/p bilateral carotid endarterectomies and aorta to innominate bypass in April 2013.    -pt is doing well from carotid endarterectomies and aorto innominate bypass grafting.  Will have him f/u in 6 months with carotid duplex scan.  -he does have significant claudication, but does not have rest pain or any tissue loss.  When he returns in 6 months, we will obtain ABI's and a lower extremity arterial duplex scan to re-evaluate his PVD.  He also had aortobifemoral bypass grafting in 2007.   Reviewing the op note, there were bilateral groin incisions made.    - He does have bilateral claudication which limits his lifestyle.  He was reassured that he is not at risk of limb loss. He is not a ideal candidate for bypass due to his  obesity.  Ruta Hinds, MD [facility] [place] Office: [phone] Pager: [phone]   VASCULAR QUALITY INITIATIVE FOLLOW UP DATA:  Current smoker: [  ] yes  [  x] no  Living status: [  x]  Home  [  ] Nursing home  [  ] Homeless     MEDS:  ASA [ x ] yes  [  ] no- [  ] medical reason  [  ] non compliant  STATIN  [x  ] yes  [  ] no- [  ] medical reason  [  ] non compliant  Beta blocker [  ] yes  [  x] no- [  ] medical reason  [  ] non compliant  ACE inhibitor [  ] yes  [  ] no- [  ] medical reason  [  ] non compliant  P2Y12 Antagonist [ x ] none  [  ] clopidogrel-Plavix  [  ] ticlopidine-Ticlid    [  ]  prasugrel-Effient  [  ] ticagrelor- Brilinta    Anticoagulant [ x ] None  [  ] warfarin  [  ] rivaroxaban-Xarelto [  ] dabigatran- Pradaxa  Neurologic event since D/C:  [ x ] no  [  ] yes: [  ] eye event  [  ] cortical event  [  ] VB event  [  ] non specific event  [  ] right  [  ] left  [  ] TIA  [  ] stroke  Date:   Modified Rankin Score: 0  MI since D/C: [ x ] no  [  ] troponin only  [  ] EKG or clinical  Cranial nerve injury: [ x ] none  [  ] resolved  [  ] persistent  Duplex CEA site: [  ] no  [ x ] yes - PSV= see report  EDV= see report  ICA/CCA ratio: right 0.78 left 0.74   Stenosis= [x  ] <40% [  ] 40-59% [  ] 60-79%  [  ] > 80%  [  ]  Occluded  CEA site re-operation:            [x  ] no   [  ] yes- date of re-op:   CEA site PCI:                          [x  ] no   [  ] yes- date of PCI:

## 2013-06-20 ENCOUNTER — Other Ambulatory Visit: Payer: Self-pay | Admitting: Gastroenterology

## 2013-06-21 ENCOUNTER — Encounter (HOSPITAL_COMMUNITY): Payer: Self-pay | Admitting: Pharmacy Technician

## 2013-06-28 ENCOUNTER — Encounter (HOSPITAL_COMMUNITY): Payer: Self-pay | Admitting: *Deleted

## 2013-07-12 ENCOUNTER — Other Ambulatory Visit: Payer: Self-pay | Admitting: Internal Medicine

## 2013-07-12 ENCOUNTER — Ambulatory Visit
Admission: RE | Admit: 2013-07-12 | Discharge: 2013-07-12 | Disposition: A | Discharge status: Home / Self Care | Payer: Medicare Other | Source: Ambulatory Visit | Attending: Internal Medicine | Admitting: Internal Medicine

## 2013-07-12 DIAGNOSIS — R071 Chest pain on breathing: Secondary | ICD-10-CM

## 2013-07-19 ENCOUNTER — Telehealth: Payer: Self-pay | Admitting: *Deleted

## 2013-07-19 NOTE — Telephone Encounter (Signed)
SPOKE WITH PT REGARDING A QUESTION ABOUT IF WE DO PEDICURES.

## 2013-07-19 NOTE — Telephone Encounter (Signed)
Pt request an appt with Dr Blenda Mounts.

## 2013-08-22 ENCOUNTER — Ambulatory Visit (HOSPITAL_COMMUNITY)
Admission: RE | Admit: 2013-08-22 | Discharge: 2013-08-22 | Disposition: A | Discharge status: Home / Self Care | Payer: Medicare Other | Source: Ambulatory Visit | Attending: Gastroenterology | Admitting: Gastroenterology

## 2013-08-22 ENCOUNTER — Ambulatory Visit (HOSPITAL_COMMUNITY): Payer: Medicare Other | Admitting: Anesthesiology

## 2013-08-22 ENCOUNTER — Encounter (HOSPITAL_COMMUNITY): Payer: Self-pay | Admitting: Anesthesiology

## 2013-08-22 ENCOUNTER — Encounter (HOSPITAL_COMMUNITY): Payer: Medicare Other | Admitting: Anesthesiology

## 2013-08-22 ENCOUNTER — Encounter (HOSPITAL_COMMUNITY)
Admission: RE | Disposition: A | Discharge status: Home / Self Care | Payer: Self-pay | Source: Ambulatory Visit | Attending: Gastroenterology

## 2013-08-22 DIAGNOSIS — Z951 Presence of aortocoronary bypass graft: Secondary | ICD-10-CM | POA: Insufficient documentation

## 2013-08-22 DIAGNOSIS — E7800 Pure hypercholesterolemia, unspecified: Secondary | ICD-10-CM | POA: Insufficient documentation

## 2013-08-22 DIAGNOSIS — Z87891 Personal history of nicotine dependence: Secondary | ICD-10-CM | POA: Insufficient documentation

## 2013-08-22 DIAGNOSIS — E1149 Type 2 diabetes mellitus with other diabetic neurological complication: Secondary | ICD-10-CM | POA: Insufficient documentation

## 2013-08-22 DIAGNOSIS — Z860100 Personal history of colon polyps, unspecified: Secondary | ICD-10-CM | POA: Insufficient documentation

## 2013-08-22 DIAGNOSIS — Z79899 Other long term (current) drug therapy: Secondary | ICD-10-CM | POA: Insufficient documentation

## 2013-08-22 DIAGNOSIS — I739 Peripheral vascular disease, unspecified: Secondary | ICD-10-CM | POA: Insufficient documentation

## 2013-08-22 DIAGNOSIS — N183 Chronic kidney disease, stage 3 unspecified: Secondary | ICD-10-CM | POA: Insufficient documentation

## 2013-08-22 DIAGNOSIS — K219 Gastro-esophageal reflux disease without esophagitis: Secondary | ICD-10-CM | POA: Insufficient documentation

## 2013-08-22 DIAGNOSIS — D175 Benign lipomatous neoplasm of intra-abdominal organs: Secondary | ICD-10-CM | POA: Insufficient documentation

## 2013-08-22 DIAGNOSIS — Z86718 Personal history of other venous thrombosis and embolism: Secondary | ICD-10-CM | POA: Insufficient documentation

## 2013-08-22 DIAGNOSIS — I129 Hypertensive chronic kidney disease with stage 1 through stage 4 chronic kidney disease, or unspecified chronic kidney disease: Secondary | ICD-10-CM | POA: Insufficient documentation

## 2013-08-22 DIAGNOSIS — I7300 Raynaud's syndrome without gangrene: Secondary | ICD-10-CM | POA: Insufficient documentation

## 2013-08-22 DIAGNOSIS — E1142 Type 2 diabetes mellitus with diabetic polyneuropathy: Secondary | ICD-10-CM | POA: Insufficient documentation

## 2013-08-22 DIAGNOSIS — Z1211 Encounter for screening for malignant neoplasm of colon: Secondary | ICD-10-CM | POA: Insufficient documentation

## 2013-08-22 HISTORY — PX: COLONOSCOPY WITH PROPOFOL: SHX5780

## 2013-08-22 LAB — GLUCOSE, CAPILLARY: GLUCOSE-CAPILLARY: 78 mg/dL (ref 70–99)

## 2013-08-22 SURGERY — COLONOSCOPY WITH PROPOFOL
Anesthesia: Monitor Anesthesia Care

## 2013-08-22 MED ORDER — PROPOFOL 10 MG/ML IV BOLUS
INTRAVENOUS | Status: AC
  Filled 2013-08-22: qty 20

## 2013-08-22 MED ORDER — LACTATED RINGERS IV SOLN
INTRAVENOUS | Status: DC | PRN
  Administered 2013-08-22: 09:00:00 via INTRAVENOUS

## 2013-08-22 MED ORDER — LACTATED RINGERS IV SOLN: INTRAVENOUS | Status: DC

## 2013-08-22 MED ORDER — PROPOFOL INFUSION 10 MG/ML OPTIME
INTRAVENOUS | Status: DC | PRN
  Administered 2013-08-22: 200 ug/kg/min via INTRAVENOUS

## 2013-08-22 MED ORDER — PROMETHAZINE HCL 25 MG/ML IJ SOLN: 6.2500 mg | INTRAMUSCULAR | Status: DC | PRN

## 2013-08-22 MED ORDER — SODIUM CHLORIDE 0.9 % IV SOLN: INTRAVENOUS | Status: DC

## 2013-08-22 SURGICAL SUPPLY — 22 items

## 2013-08-22 NOTE — Transfer of Care (Signed)
Immediate Anesthesia Transfer of Care Note  Patient: [name]  Procedure(s) Performed: Procedure(s): COLONOSCOPY WITH PROPOFOL (N/A)  Patient Location: PACU  Anesthesia Type:MAC  Level of Consciousness: awake and alert   Airway & Oxygen Therapy: Patient Spontanous Breathing and Patient connected to face mask oxygen  Post-op Assessment: Report given to PACU RN and Post -op Vital signs reviewed and stable  Post vital signs: Reviewed and stable  Complications: No apparent anesthesia complications

## 2013-08-22 NOTE — Anesthesia Postprocedure Evaluation (Signed)
Anesthesia Post Note  Patient: [name]  Procedure(s) Performed: Procedure(s) (LRB): COLONOSCOPY WITH PROPOFOL (N/A)  Anesthesia type: MAC  Patient location: PACU  Post pain: Pain level controlled  Post assessment: Post-op Vital signs reviewed  Last Vitals:  Filed Vitals:   08/22/13 1110  BP: 189/83  Pulse:   Temp:   Resp: 13    Post vital signs: Reviewed  Level of consciousness: sedated  Complications: No apparent anesthesia complications

## 2013-08-22 NOTE — Anesthesia Preprocedure Evaluation (Addendum)
Anesthesia Evaluation  Patient identified by MRN, date of birth, ID band Patient awake    Reviewed: Allergy & Precautions, H&P , NPO status , Patient's Chart, lab work & pertinent test results  History of Anesthesia Complications Negative for: history of anesthetic complications  Airway Mallampati: II TM Distance: >3 FB Neck ROM: Full    Dental  (+) Teeth Intact, Dental Advisory Given   Pulmonary pneumonia -, former smoker,  breath sounds clear to auscultation        Cardiovascular hypertension, Pt. on medications + CAD, + CABG, + Peripheral Vascular Disease and DVT Rhythm:Regular Rate:Bradycardia     Neuro/Psych  Neuromuscular disease    GI/Hepatic Neg liver ROS, GERD-  Controlled,Hx of colon polyps   Endo/Other  diabetes, Type 2, Oral Hypoglycemic Agents  Renal/GU Renal disease     Musculoskeletal negative musculoskeletal ROS (+)   Abdominal   Peds  Hematology negative hematology ROS (+)   Anesthesia Other Findings   Reproductive/Obstetrics                         Anesthesia Physical Anesthesia Plan  ASA: III  Anesthesia Plan: MAC   Post-op Pain Management:    Induction: Intravenous  Airway Management Planned: Simple Face Mask  Additional Equipment:   Intra-op Plan:   Post-operative Plan:   Informed Consent: I have reviewed the patients History and Physical, chart, labs and discussed the procedure including the risks, benefits and alternatives for the proposed anesthesia with the patient or authorized representative who has indicated his/her understanding and acceptance.   Dental advisory given  Plan Discussed with: CRNA  Anesthesia Plan Comments: (Hx of recent rib fracture.)        Anesthesia Quick Evaluation

## 2013-08-22 NOTE — Op Note (Signed)
Procedure: Surveillance colonoscopy. Personal history of adenomatous colon polyps removed colonoscopically in the past.  Endoscopist: Earle Gell  Premedication: Propofol administered by anesthesia  Procedure: The patient was placed in the left lateral decubitus position. Anal inspection and digital rectal exam were normal. The Pentax pediatric colonoscope was introduced into the rectum and advanced to the cecum. A normal-appearing ileocecal valve and appendiceal orifice were identified. Colonic preparation for the exam today was good.  Rectum. Normal. Retroflexed view of the distal rectum normal  Sigmoid colon and descending colon. Normal  Splenic flexure. Normal  Transverse colon. In the proximal transverse colon, a 1 cm lipoma was visualized with normal overlying colonic mucosa.  Hepatic flexure. Normal  Ascending colon. Normal  Cecum and ileocecal valve. Normal  Assessment: Normal surveillance proctocolonoscopy to the cecum. A 1 cm lipomatous appearing polyp was present in the proximal transverse colon.

## 2013-08-22 NOTE — H&P (Signed)
  Procedure: Surveillance colonoscopy. Personal history of adenomatous colon polyps removed colonoscopically in the past.  History: The patient is a 73 year old male born May 01, 1941. He is scheduled to undergo a screening colonoscopy today.  Past medical history: Type 2 diabetes mellitus. Diabetic neuropathy. Peripheral vascular disease with claudication. Stage III chronic kidney disease. Left carotid endarterectomy. Coronary artery disease. Hypertension. Abdominal aortic aneurysm repair in 2007. Chronic rhinitis. Hypercholesterolemia. Chronic low back pain. Right leg sciatica. Kidney stones. Raynaud's syndrome. History of adenomatous colon polyps. Left arm DVT post bypass in April 2013. Coronary artery bypass grafting. Glaucoma surgery. Left shoulder surgery. Tonsillectomy. Left knee arthroscopy.  Medication allergies: Augmentin causes nausea.  Habits: The patient quit smoking cigarettes in 2007. He consumes alcohol in moderation  Exam: The patient is alert and lying comfortably on the endoscopy stretcher. Cardiac exam reveals a regular rhythm. Lungs are clear to auscultation. Abdomen is nontender to palpation  Plan: Proceed with surveillance colonoscopy

## 2013-08-22 NOTE — Discharge Instructions (Signed)
Monitored Anesthesia Care  Monitored anesthesia care is an anesthesia service for a medical procedure. Anesthesia is the loss of the ability to feel pain. It is produced by medications called anesthetics. It may affect a small area of your body (local anesthesia), a large area of your body (regional anesthesia), or your entire body (general anesthesia). The need for monitored anesthesia care depends your procedure, your condition, and the potential need for regional or general anesthesia. It is often provided during procedures where:   General anesthesia may be needed if there are complications. This is because you need special care when you are under general anesthesia.   You will be under local or regional anesthesia. This is so that you are able to have higher levels of anesthesia if needed.   You will receive calming medications (sedatives). This is especially the case if sedatives are given to put you in a semi-conscious state of relaxation (deep sedation). This is because the amount of sedative needed to produce this state can be hard to predict. Too much of a sedative can produce general anesthesia. Monitored anesthesia care is performed by one or more caregivers who have special training in all types of anesthesia. You will need to meet with these caregivers before your procedure. During this meeting, they will ask you about your medical history. They will also give you instructions to follow. (For example, you will need to stop eating and drinking before your procedure. You may also need to stop or change medications you are taking.) During your procedure, your caregivers will stay with you. They will:   Watch your condition. This includes watching you blood pressure, breathing, and level of pain.   Diagnose and treat problems that occur.   Give medications if they are needed. These may include calming medications (sedatives) and anesthetics.   Make sure you are comfortable.  Having  monitored anesthesia care does not necessarily mean that you will be under anesthesia. It does mean that your caregivers will be able to manage anesthesia if you need it or if it occurs. It also means that you will be able to have a different type of anesthesia than you are having if you need it. When your procedure is complete, your caregivers will continue to watch your condition. They will make sure any medications wear off before you are allowed to go home.  Document Released: 02/04/2005 Document Revised: 09/05/2012 Document Reviewed: 06/22/2012 [facility] Patient Information 2014 [place], [place]. Colonoscopy Care After These instructions give you information on caring for yourself after your procedure. Your doctor may also give you more specific instructions. Call your doctor if you have any problems or questions after your procedure. HOME CARE  Take it easy for the next 24 hours.  Rest.  Walk or use warm packs on your belly (abdomen) if you have belly cramping or gas.  Do not drive for 24 hours.  You may shower.  Do not sign important papers or use machinery for 24 hours.  Drink enough fluids to keep your pee (urine) clear or pale yellow.  Resume your normal diet. Avoid heavy or fried foods.  Avoid alcohol.  Continue taking your normal medicines.  Only take medicine as told by your doctor. Do not take aspirin. If you had growths (polyps) removed:  Do not take aspirin.  Do not drink alcohol for 7 days or as told by your doctor.  Eat a soft diet for 24 hours. GET HELP RIGHT AWAY IF:  You have a fever.  You pass clumps of tissue (blood clots) or fill the toilet with blood.  You have belly pain that gets worse and medicine does not help.  Your belly is puffy (swollen).  You feel sick to your stomach (nauseous) or throw up (vomit). MAKE SURE YOU:  Understand these instructions.  Will watch your condition.  Will get help right away if you are not doing well or get  worse. Document Released: 06/13/2010 Document Revised: 08/03/2011 Document Reviewed: 01/16/2013 [facility] Patient Information 2014 [place].

## 2013-08-23 ENCOUNTER — Encounter (HOSPITAL_COMMUNITY): Payer: Self-pay | Admitting: Gastroenterology

## 2013-12-21 ENCOUNTER — Encounter (HOSPITAL_COMMUNITY): Payer: Medicare Other

## 2013-12-21 ENCOUNTER — Other Ambulatory Visit (HOSPITAL_COMMUNITY): Payer: Medicare Other

## 2013-12-21 ENCOUNTER — Ambulatory Visit: Payer: Medicare Other | Admitting: Vascular Surgery

## 2014-01-23 ENCOUNTER — Telehealth: Payer: Self-pay

## 2014-01-23 NOTE — Telephone Encounter (Signed)
pt called in to rqst annul f/u appt.pt walked in thinking he had an appt with Dr.Smith. pt appt was not moved over form [facility]. pt aware of appt sch on 9/15 @12pm . pt verbalized understanding.

## 2014-01-24 ENCOUNTER — Encounter: Payer: Self-pay | Admitting: Vascular Surgery

## 2014-01-25 ENCOUNTER — Ambulatory Visit (INDEPENDENT_AMBULATORY_CARE_PROVIDER_SITE_OTHER)
Admission: RE | Admit: 2014-01-25 | Discharge: 2014-01-25 | Disposition: A | Discharge status: Home / Self Care | Payer: Medicare Other | Source: Ambulatory Visit | Attending: Vascular Surgery | Admitting: Vascular Surgery

## 2014-01-25 ENCOUNTER — Ambulatory Visit (INDEPENDENT_AMBULATORY_CARE_PROVIDER_SITE_OTHER): Payer: Medicare Other | Admitting: Vascular Surgery

## 2014-01-25 ENCOUNTER — Ambulatory Visit (HOSPITAL_COMMUNITY)
Admission: RE | Admit: 2014-01-25 | Discharge: 2014-01-25 | Disposition: A | Discharge status: Home / Self Care | Payer: Medicare Other | Source: Ambulatory Visit | Attending: Vascular Surgery | Admitting: Vascular Surgery

## 2014-01-25 ENCOUNTER — Encounter: Payer: Self-pay | Admitting: Vascular Surgery

## 2014-01-25 VITALS — BP 145/74 | HR 52 | Temp 97.5°F | Resp 16 | Ht 71.0 in | Wt 292.0 lb

## 2014-01-25 DIAGNOSIS — I739 Peripheral vascular disease, unspecified: Secondary | ICD-10-CM

## 2014-01-25 DIAGNOSIS — I6529 Occlusion and stenosis of unspecified carotid artery: Secondary | ICD-10-CM

## 2014-01-25 DIAGNOSIS — Z48812 Encounter for surgical aftercare following surgery on the circulatory system: Secondary | ICD-10-CM

## 2014-01-25 NOTE — Progress Notes (Signed)
[facility] [place] [facility] HISTORY AND PHYSICAL   CC:  Follow up   Anthony Shelling, MD  HPI: This is a 73 y.o. male  who previously underwent bilateral CEA and aorta to innominate bypass in April 2013.  He returns today for f/u carotid duplex.  He denies amaurosis fugax, weakness in his arms or legs or difficulty speaking.  He does state that when he goes to sit down at the bar for breakfast, his legs will go numb, but this improves as he gets up moving around.  He still has some memory problems but overall is improved. He does complain of pain in his legs while walking.  He states that he can walk about a half a block before he starts to have cramps in his legs.  This is relieved with rest.  He states that is is very limiting to his lifestyle.  He is able to do the recumbent stepper for usually 2x per week but sometimes 3 times. He denies rest pain. He has no open wounds. He currently is able to walk approximately 2 blocks before he has to rest.  He does state that he has SOB while lying flat and does have to sleep propped up on pillows.  He also complains of swelling in his legs, more so in his right leg.      Past Medical History  Diagnosis Date  . Chest pain   . Muscle pain   . Diarrhea   . Hyperlipidemia     takes Tricor and Lipitor daily  . Hyperlipidemia   . Atherosclerotic heart disease   . PVD (peripheral vascular disease)   . Peripheral neuropathy   . Seasonal allergies     takes Mucinex and Zyrtec prn  . Nasal polyps     hx of  . Edema     right leg knee down swollen since fall 3 wks ago  . Back pain   . GERD (gastroesophageal reflux disease)     Rolaids as needed-occ reflux  . Colon polyps   . Renal insufficiency   . Urinary frequency     urgency/takes Vesicare daily  . Kidney stone     35+yrs ago  . Pneumonia     as a child  . Hypertension     takes Altace,Amlodipine,and Nadolol daily  . Peripheral edema     wears knee length hose-told by  podiatrist to wear these  . Peripheral neuropathy     takes Gabapentin daily  . Arthritis   . Bruises easily     takes Pletal daily and ASA 325mg  daily  . Hemorrhoids   . Cyst     on perineum;takes Doxycycline daily  . History of kidney stones     at age 89   . Diabetes mellitus     takes Glipizide,Metformin,and Actos daily  . Cataract immature     right    Past Surgical History  Procedure Laterality Date  . Aortobifemoral bypass    . Reimplantation of inferior mesenteric artery    . Repair of infrarenal abdominal aortic aneurysm    . Shoulder arthroscopy w/ rotator cuff repair      right and left-open procedures  . Tonsillectomy      at age 74  . Knee arthroscopy  2011  . Cataract surgery      left  . Wisdom teeth extracted      as a teenager  . Carotid endarterectomy  04/08/2011    left  CEA  . Endarterectomy  04/08/2011    Procedure: ENDARTERECTOMY CAROTID;  Surgeon: Elam Dutch, MD;  Location: [facility] OR;  Service: Vascular;  Laterality: Left;  with patch angioplasty  . Cardiac catheterization  most recent 05/2011    total of 4  . Colonoscopy    . Coronary artery bypass graft  09/09/2011    Procedure: CORONARY ARTERY BYPASS GRAFTING (CABG);  Surgeon: Gaye Pollack, MD;  Location: [facility];  Service: Open Heart Surgery;  Laterality: N/A;  Coronary artery bypass grafting  x three with Right saphenous vein harvested endoscopically and left internal mammary artery  . Endarterectomy  09/09/2011    Procedure: ENDARTERECTOMY CAROTID;  Surgeon: Elam Dutch, MD;  Location: [facility] OR;  Service: Vascular;  Laterality: Right;  with patch angioplasty  . Colonoscopy with propofol N/A 08/22/2013    Procedure: COLONOSCOPY WITH PROPOFOL;  Surgeon: Garlan Fair, MD;  Location: [facility] ENDOSCOPY;  Service: Endoscopy;  Laterality: N/A;    Allergies    Allergen   Reactions    .   Amoxicillin-Pot Clavulanate   Nausea Only        Current Outpatient Prescriptions on File Prior to Visit   Medication Sig Dispense Refill  . aspirin 325 MG EC tablet Take 325 mg by mouth every evening.       Marland Kitchen atorvastatin (LIPITOR) 20 MG tablet Take 20 mg by mouth every evening.       Marland Kitchen b complex vitamins capsule Take 1 capsule by mouth daily.        Marland Kitchen CALCIUM CARB-MAGNESIUM CARB PO Take 1 tablet by mouth daily as needed (heart burn).      . Cetirizine HCl (ZYRTEC ALLERGY PO) Take 10 tablets by mouth daily as needed (allergies). For allergies      . cilostazol (PLETAL) 100 MG tablet Take 100 mg by mouth 2 (two) times daily.       . Cyanocobalamin (VITAMIN B 12 PO) Take 1 tablet by mouth every other day.      . fenofibrate (TRICOR) 145 MG tablet Take 145 mg by mouth every morning.      . furosemide (LASIX) 40 MG tablet Take 40 mg by mouth every other day.      . gabapentin (NEURONTIN) 300 MG capsule Take 300 mg by mouth 2 (two) times daily. Sometimes takes addition dose      . glipiZIDE (GLUCOTROL) 10 MG tablet Take 10 mg by mouth every morning.       . Loperamide HCl (IMODIUM PO) Take 2 mg by mouth daily as needed. For diarrhea      . metFORMIN (GLUCOPHAGE) 500 MG tablet Take 500 mg by mouth 2 (two) times daily with a meal.       . methylcellulose (ARTIFICIAL TEARS) 1 % ophthalmic solution Place 1 drop into both eyes 2 (two) times daily as needed (dry eyes).      . metoprolol (LOPRESSOR) 50 MG tablet Take 1 tablet (50 mg total) by mouth 2 (two) times daily.  60 tablet  1  . metoprolol (LOPRESSOR) 50 MG tablet Take 50 mg by mouth 2 (two) times daily.      . nitroGLYCERIN (NITROSTAT) 0.4 MG SL tablet Place 0.4 mg under the tongue every 5 (five) minutes as needed for chest pain.      Marland Kitchen oxymetazoline (AFRIN) 0.05 % nasal spray Place 1 spray into both nostrils 2 (two) times daily as needed for congestion.      . pioglitazone (ACTOS)  30 MG tablet Take 30 mg by mouth every morning.      . potassium chloride SA (K-DUR,KLOR-CON) 20 MEQ tablet Take 20 mEq by mouth every other day. Takes with lasix      .  pseudoephedrine-guaifenesin (MUCINEX D) 60-600 MG per tablet Take 1 tablet by mouth every 12 (twelve) hours as needed for congestion. Congestion      . ramipril (ALTACE) 10 MG tablet Take 10 mg by mouth every evening.       . simethicone (MYLICON) 80 MG chewable tablet Chew 80-160 mg by mouth every 6 (six) hours as needed for flatulence.      . sodium chloride (OCEAN) 0.65 % SOLN nasal spray Place 1 spray into both nostrils 2 (two) times daily as needed for congestion.      . temazepam (RESTORIL) 15 MG capsule Take 15 mg by mouth at bedtime as needed. For sleep      . warfarin (COUMADIN) 2.5 MG tablet Take 2.5 mg by mouth daily at 6 PM. TAKE AS DIRECTED       No current facility-administered medications on file prior to visit.      Family History    Problem   Relation   Age of Onset    .   Heart disease   Mother       .   Heart disease   Father       .   Anesthesia problems   Neg Hx       .   Hypotension   Neg Hx       .   Malignant hyperthermia   Neg Hx       .   Pseudochol deficiency   Neg Hx           History       Social History    .   Marital Status:   Married          Spouse Name:   N/A          Number of Children:   N/A    .   Years of Education:   N/A       Occupational History    .   Not on file.       Social History Main Topics    .   Smoking status:   Former Smoker -- 40 years          Types:   Cigarettes          Quit date:   10/27/2005    .   Smokeless tobacco:   Not on file    .   Alcohol Use:   0.0 oz/week          2-3 Cans of beer per week             Comment: occasional    .   Drug Use:   No    .   Sexually Active:   No       Other Topics   Concern    .   Not on file       Social History Narrative    .   No narrative on file       ROS: [x]  Positive   [ ]  Negative   [ ]  All sytems reviewed and are negative  Cardiovascular: []  chest pain/pressure []  palpitations []  SOB lying flat []  DOE [x]  pain in legs while walking-can walk about a half a  block and pain is relieved with rest []  pain in feet when lying flat []  hx of DVT []  hx of phlebitis [x]  swelling in legs-right >left []  varicose veins  Pulmonary: []  productive cough []  asthma []  wheezing  Neurologic: []  weakness in []  arms []  legs []  numbness in []  arms []  legs [] difficulty speaking or slurred speech []  temporary loss of vision in one eye []  dizziness  Hematologic: []  bleeding problems []  problems with blood clotting easily  GI []  vomiting blood []  blood in stool  GU: []  burning with urination []  blood in urine  Psychiatric: []  hx of major depression  Integumentary: []  rashes []  ulcers  Constitutional: []  fever []  chills   PHYSICAL EXAMINATION:   General:  WDWN obese in NAD Neck: No carotid bruits  Pulmonary: normal non-labored breathing , without Rales, rhonchi,  wheezing Cardiac: RRR, without  Murmurs, rubs or gallops Abdomen: soft, NT, no masses, obese Skin: without rashes, without ulcers   Vascular Exam/Pulses:  no palpable pedal pulses femoral pulses difficult to palpate most likely secondary to obesity Extremities: without ischemic changes, without Gangrene , without cellulitis; without open wounds;   Musculoskeletal: no muscle wasting or atrophy       Neurologic: A&O X 3; Appropriate Affect ; SENSATION: normal; MOTOR FUNCTION:  moving all extremities equally. Speech is fluent/normal   Non-Invasive Vascular Imaging:    Patient had a bilateral carotid duplex exam today which showed no significant carotid stenosis. His right subclavian artery was also patent suggesting his innominate bypass is patent. He had bilateral ABIs performed which were 0.65 on the right and 1.01 on the left. However waveforms were monophasic bilaterally. The patient has known bilateral superficial femoral artery occlusions. Most likely the left ABIs artificially inflated secondary calcification.   ASSESSMENT/PLAN: 73 y.o. male s/p bilateral carotid  endarterectomies and aorta to innominate bypass in April 2013.    -pt is doing well from carotid endarterectomies and aorto innominate bypass grafting.  Will have him f/u in 6 months with carotid duplex scan.  -he does have significant claudication, but does not have rest pain or any tissue loss.  When he returns in 6 months, we will obtain ABI's  to re-evaluate his PVD.  He also had aortobifemoral bypass grafting in 2007.   Reviewing the op note, there were bilateral groin incisions made.    - He does have bilateral claudication which minimally limits his lifestyle.  He was reassured that he is not at risk of limb loss. He is not a ideal candidate for bypass due to his obesity.  He does have chronic right leg swelling most likely secondary to previous saphenous vein harvesting. He has used compression stockings in the past with some relief. I recommended he uses this in the future if the swelling becomes bothersome.  Ruta Hinds, MD [facility] [place] Office: [phone] Pager: [phone]   VASCULAR QUALITY INITIATIVE FOLLOW UP DATA:  Current smoker: [  ] yes  [  x] no  Living status: [  x]  Home  [  ] Nursing home  [  ] Homeless     MEDS:  ASA [ x ] yes  [  ] no- [  ] medical reason  [  ] non compliant  STATIN  [x  ] yes  [  ] no- [  ] medical reason  [  ] non compliant  Beta blocker [  ] yes  [  x] no- [  ] medical  reason  [  ] non compliant  ACE inhibitor [  ] yes  [  ] no- [  ] medical reason  [  ] non compliant  P2Y12 Antagonist [ x ] none  [  ] clopidogrel-Plavix  [  ] ticlopidine-Ticlid    [  ] prasugrel-Effient  [  ] ticagrelor- Brilinta    Anticoagulant [ x ] None  [  ] warfarin  [  ] rivaroxaban-Xarelto [  ] dabigatran- Pradaxa  Neurologic event since D/C:  [ x ] no  [  ] yes: [  ] eye event  [  ] cortical event  [  ] VB event  [  ] non specific event  [  ] right  [  ] left  [  ] TIA  [  ] stroke  Date:   Modified Rankin Score: 0  MI  since D/C: [ x ] no  [  ] troponin only  [  ] EKG or clinical  Cranial nerve injury: [ x ] none  [  ] resolved  [  ] persistent  Duplex CEA site: [  ] no  [ x ] yes - PSV= see report  EDV= see report  ICA/CCA ratio: right 0.78 left 0.74   Stenosis= [x  ] <40% [  ] 40-59% [  ] 60-79%  [  ] > 80%  [  ]  Occluded  CEA site re-operation:            [x  ] no   [  ] yes- date of re-op:   CEA site PCI:                          [x  ] no   [  ] yes- date of PCI:

## 2014-01-25 NOTE — Addendum Note (Signed)
Addended by: Mena Goes on: 01/25/2014 03:11 PM   Modules accepted: Orders

## 2014-02-06 ENCOUNTER — Ambulatory Visit (INDEPENDENT_AMBULATORY_CARE_PROVIDER_SITE_OTHER): Payer: Medicare Other | Admitting: Interventional Cardiology

## 2014-02-06 ENCOUNTER — Encounter: Payer: Self-pay | Admitting: Interventional Cardiology

## 2014-02-06 VITALS — BP 114/52 | HR 55 | Ht 71.0 in | Wt 290.0 lb

## 2014-02-06 DIAGNOSIS — I10 Essential (primary) hypertension: Secondary | ICD-10-CM

## 2014-02-06 DIAGNOSIS — I5032 Chronic diastolic (congestive) heart failure: Secondary | ICD-10-CM

## 2014-02-06 DIAGNOSIS — E118 Type 2 diabetes mellitus with unspecified complications: Secondary | ICD-10-CM

## 2014-02-06 DIAGNOSIS — I5043 Acute on chronic combined systolic (congestive) and diastolic (congestive) heart failure: Secondary | ICD-10-CM | POA: Insufficient documentation

## 2014-02-06 DIAGNOSIS — Z951 Presence of aortocoronary bypass graft: Secondary | ICD-10-CM

## 2014-02-06 DIAGNOSIS — N183 Chronic kidney disease, stage 3 unspecified: Secondary | ICD-10-CM | POA: Insufficient documentation

## 2014-02-06 DIAGNOSIS — Z9889 Other specified postprocedural states: Secondary | ICD-10-CM

## 2014-02-06 DIAGNOSIS — I25810 Atherosclerosis of coronary artery bypass graft(s) without angina pectoris: Secondary | ICD-10-CM

## 2014-02-06 DIAGNOSIS — I739 Peripheral vascular disease, unspecified: Secondary | ICD-10-CM

## 2014-02-06 NOTE — Patient Instructions (Signed)
Your physician recommends that you continue on your current medications as directed. Please refer to the Current Medication list given to you today.  Your physician wants you to follow-up in: 1 year. You will receive a reminder letter in the mail two months in advance. If you don't receive a letter, please call our office to schedule the follow-up appointment.  

## 2014-02-06 NOTE — Progress Notes (Signed)
Patient ID: [name], male   DOB: December 02, 1940, 73 y.o.   MRN: [id number]    [address], [address] [place], [place]  [address] Phone: [phone] Fax:  [phone]  Date:  02/06/2014   ID:  [name], DOB 01-01-1941, MRN [id number]  PCP:  Irven Shelling, MD   ASSESSMENT:  1. Coronary artery disease, status post coronary bypass surgery, stable with stable angina 2. Chronic diastolic heart failure, stable 3. Hypertension, stable 4. Chronic kidney disease, stage III, stable 5. Hypertension, controlled 6. Carotid artery disease status post carotid endarterectomy, asymptomatic 7. Peripheral arterial disease with claudication, stable  PLAN:  1. Continue current medical regimen including the current diuretic regimen instituted by Dr. Laurann Montana which is every other day furosemide  2. Aerobic activity, as much as tolerated for claudication and CAD 3. Sublingual nitroglycerin as needed. Call if increasing anginal episodes 4. Followup in one year   SUBJECTIVE: [name] is a 73 y.o. male who is doing well. He is retired from the [employer]. He denies significant episodes of angina. He takes nitroglycerin from time to time. He has not had palpitations or syncope. He does not right lower shin edema greater than left. Right worse from his ascites prior bypass graft harvesting. He has not had palpitations, syncope, or transient neurological episodes. There is two-pillow orthopnea. This is stable. He is tolerating the reduced dose of furosemide to 40 mg every other day rather than daily. This was done by Dr. Laurann Montana because of renal impairment.   Wt Readings from Last 3 Encounters:  02/06/14 290 lb (131.543 kg)  01/25/14 292 lb (132.45 kg)  08/22/13 280 lb (127.007 kg)     Past Medical History  Diagnosis Date  . Chest pain   . Muscle pain   . Diarrhea   . Hyperlipidemia     takes Tricor and Lipitor daily  . Hyperlipidemia   . Atherosclerotic heart disease   . PVD  (peripheral vascular disease)   . Peripheral neuropathy   . Seasonal allergies     takes Mucinex and Zyrtec prn  . Nasal polyps     hx of  . Edema     right leg knee down swollen since fall 3 wks ago  . Back pain   . GERD (gastroesophageal reflux disease)     Rolaids as needed-occ reflux  . Colon polyps   . Renal insufficiency   . Urinary frequency     urgency/takes Vesicare daily  . Kidney stone     35+yrs ago  . Pneumonia     as a child  . Hypertension     takes Altace,Amlodipine,and Nadolol daily  . Peripheral edema     wears knee length hose-told by podiatrist to wear these  . Peripheral neuropathy     takes Gabapentin daily  . Arthritis   . Bruises easily     takes Pletal daily and ASA 325mg  daily  . Hemorrhoids   . Cyst     on perineum;takes Doxycycline daily  . History of kidney stones     at age 66   . Diabetes mellitus     takes Glipizide,Metformin,and Actos daily  . Cataract immature     right    Current Outpatient Prescriptions  Medication Sig Dispense Refill  . aspirin 325 MG EC tablet Take 325 mg by mouth every evening.       Marland Kitchen atorvastatin (LIPITOR) 20 MG tablet Take 20 mg by mouth every evening.       Marland Kitchen  b complex vitamins capsule Take 1 capsule by mouth daily.        Marland Kitchen CALCIUM CARB-MAGNESIUM CARB PO Take 1 tablet by mouth daily as needed (heart burn).      . Cetirizine HCl (ZYRTEC ALLERGY PO) Take 10 tablets by mouth daily as needed (allergies). For allergies      . cilostazol (PLETAL) 100 MG tablet Take 100 mg by mouth 2 (two) times daily.       . Cyanocobalamin (VITAMIN B 12 PO) Take 1 tablet by mouth every other day.      . fenofibrate (TRICOR) 145 MG tablet Take 145 mg by mouth every morning.      . furosemide (LASIX) 40 MG tablet Take 40 mg by mouth every other day.      . gabapentin (NEURONTIN) 300 MG capsule Take 300 mg by mouth 2 (two) times daily. Sometimes takes addition dose      . glipiZIDE (GLUCOTROL) 10 MG tablet Take 10 mg by mouth  every morning.       . Loperamide HCl (IMODIUM PO) Take 2 mg by mouth daily as needed. For diarrhea      . metFORMIN (GLUCOPHAGE) 500 MG tablet Take 500 mg by mouth 2 (two) times daily with a meal.       . methylcellulose (ARTIFICIAL TEARS) 1 % ophthalmic solution Place 1 drop into both eyes 2 (two) times daily as needed (dry eyes).      . metoprolol (LOPRESSOR) 50 MG tablet Take 50 mg by mouth 2 (two) times daily.      . nitroGLYCERIN (NITROSTAT) 0.4 MG SL tablet Place 0.4 mg under the tongue every 5 (five) minutes as needed for chest pain.      Marland Kitchen oxymetazoline (AFRIN) 0.05 % nasal spray Place 1 spray into both nostrils 2 (two) times daily as needed for congestion.      . pioglitazone (ACTOS) 30 MG tablet Take 15 mg by mouth every morning.       . potassium chloride SA (K-DUR,KLOR-CON) 20 MEQ tablet Take 20 mEq by mouth every other day. Takes with lasix      . pseudoephedrine-guaifenesin (MUCINEX D) 60-600 MG per tablet Take 1 tablet by mouth every 12 (twelve) hours as needed for congestion. Congestion      . ramipril (ALTACE) 10 MG tablet Take 10 mg by mouth every evening.       . simethicone (MYLICON) 80 MG chewable tablet Chew 80-160 mg by mouth every 6 (six) hours as needed for flatulence.      . sodium chloride (OCEAN) 0.65 % SOLN nasal spray Place 1 spray into both nostrils 2 (two) times daily as needed for congestion.      . temazepam (RESTORIL) 15 MG capsule Take 15 mg by mouth at bedtime as needed. For sleep      . warfarin (COUMADIN) 2.5 MG tablet Take 2.5 mg by mouth daily at 6 PM. TAKE AS DIRECTED       No current facility-administered medications for this visit.    Allergies:    Allergies  Allergen Reactions  . Amoxicillin-Pot Clavulanate Nausea Only    Social History:  The patient  reports that he quit smoking about 8 years ago. His smoking use included Cigarettes. He smoked 0.00 packs per day for 40 years. He does not have any smokeless tobacco history on file. He reports  that he drinks alcohol. He reports that he does not use illicit drugs.   ROS:  Please see  the history of present illness.   No blood in urine or stool. No transient neurological complaints. Denies PND. No episodes of syncope.   All other systems reviewed and negative.   OBJECTIVE: VS:  BP 114/52  Pulse 55  Ht 5\' 10"  (1.778 m)  Wt 290 lb (131.543 kg)  BMI 41.61 kg/m2 Well nourished, well developed, in no acute distress, obese HEENT: normal Neck: JVD flat. Carotid bruit absent  Cardiac:  normal S1, S2; RRR; no murmur Lungs:  clear to auscultation bilaterally, no wheezing, rhonchi or rales Abd: soft, nontender, no hepatomegaly Ext: Edema 1+ right lower extremity edema. None on left.. Pulses diminished pedal pulses. Skin: warm and dry Neuro:  CNs 2-12 intact, no focal abnormalities noted  EKG:  SB with RBBB        Signed, Illene Labrador III, MD 02/06/2014 1:05 PM

## 2014-03-27 ENCOUNTER — Other Ambulatory Visit: Payer: Self-pay | Admitting: Interventional Cardiology

## 2014-05-03 ENCOUNTER — Encounter (HOSPITAL_COMMUNITY): Payer: Self-pay | Admitting: Vascular Surgery

## 2014-06-12 ENCOUNTER — Other Ambulatory Visit: Payer: Self-pay | Admitting: Orthopedic Surgery

## 2014-06-26 ENCOUNTER — Encounter (HOSPITAL_BASED_OUTPATIENT_CLINIC_OR_DEPARTMENT_OTHER): Payer: Self-pay | Admitting: *Deleted

## 2014-06-26 NOTE — Progress Notes (Signed)
   06/26/14 1256  OBSTRUCTIVE SLEEP APNEA  Have you ever been diagnosed with sleep apnea through a sleep study? No  Do you snore loudly (loud enough to be heard through closed doors)?  0  Do you often feel tired, fatigued, or sleepy during the daytime? 0  Has anyone observed you stop breathing during your sleep? 0  Do you have, or are you being treated for high blood pressure? 1  BMI more than 35 kg/m2? 1  Age over 75 years old? 1  Gender: 1  Obstructive Sleep Apnea Score 4  Score 4 or greater  Results sent to PCP

## 2014-06-26 NOTE — Progress Notes (Signed)
Will need istat-

## 2014-06-28 ENCOUNTER — Encounter (HOSPITAL_BASED_OUTPATIENT_CLINIC_OR_DEPARTMENT_OTHER)
Admission: RE | Disposition: A | Discharge status: Home / Self Care | Payer: Self-pay | Source: Ambulatory Visit | Attending: Orthopedic Surgery

## 2014-06-28 ENCOUNTER — Ambulatory Visit (HOSPITAL_BASED_OUTPATIENT_CLINIC_OR_DEPARTMENT_OTHER)
Admission: RE | Admit: 2014-06-28 | Discharge: 2014-06-28 | Disposition: A | Discharge status: Home / Self Care | Payer: Medicare Other | Source: Ambulatory Visit | Attending: Orthopedic Surgery | Admitting: Orthopedic Surgery

## 2014-06-28 ENCOUNTER — Encounter (HOSPITAL_BASED_OUTPATIENT_CLINIC_OR_DEPARTMENT_OTHER): Payer: Self-pay | Admitting: Anesthesiology

## 2014-06-28 ENCOUNTER — Ambulatory Visit (HOSPITAL_BASED_OUTPATIENT_CLINIC_OR_DEPARTMENT_OTHER): Payer: Medicare Other | Admitting: Anesthesiology

## 2014-06-28 DIAGNOSIS — Z6841 Body Mass Index (BMI) 40.0 and over, adult: Secondary | ICD-10-CM | POA: Diagnosis not present

## 2014-06-28 DIAGNOSIS — M1990 Unspecified osteoarthritis, unspecified site: Secondary | ICD-10-CM | POA: Diagnosis not present

## 2014-06-28 DIAGNOSIS — E119 Type 2 diabetes mellitus without complications: Secondary | ICD-10-CM | POA: Diagnosis not present

## 2014-06-28 DIAGNOSIS — M65331 Trigger finger, right middle finger: Secondary | ICD-10-CM | POA: Diagnosis present

## 2014-06-28 DIAGNOSIS — Z87442 Personal history of urinary calculi: Secondary | ICD-10-CM | POA: Insufficient documentation

## 2014-06-28 DIAGNOSIS — I739 Peripheral vascular disease, unspecified: Secondary | ICD-10-CM | POA: Insufficient documentation

## 2014-06-28 DIAGNOSIS — Z8701 Personal history of pneumonia (recurrent): Secondary | ICD-10-CM | POA: Insufficient documentation

## 2014-06-28 DIAGNOSIS — I509 Heart failure, unspecified: Secondary | ICD-10-CM | POA: Diagnosis not present

## 2014-06-28 DIAGNOSIS — Z87891 Personal history of nicotine dependence: Secondary | ICD-10-CM | POA: Insufficient documentation

## 2014-06-28 DIAGNOSIS — I2510 Atherosclerotic heart disease of native coronary artery without angina pectoris: Secondary | ICD-10-CM | POA: Diagnosis not present

## 2014-06-28 DIAGNOSIS — Z79899 Other long term (current) drug therapy: Secondary | ICD-10-CM | POA: Diagnosis not present

## 2014-06-28 DIAGNOSIS — K219 Gastro-esophageal reflux disease without esophagitis: Secondary | ICD-10-CM | POA: Insufficient documentation

## 2014-06-28 DIAGNOSIS — I25119 Atherosclerotic heart disease of native coronary artery with unspecified angina pectoris: Secondary | ICD-10-CM | POA: Insufficient documentation

## 2014-06-28 DIAGNOSIS — G629 Polyneuropathy, unspecified: Secondary | ICD-10-CM | POA: Insufficient documentation

## 2014-06-28 DIAGNOSIS — N189 Chronic kidney disease, unspecified: Secondary | ICD-10-CM | POA: Diagnosis not present

## 2014-06-28 DIAGNOSIS — I129 Hypertensive chronic kidney disease with stage 1 through stage 4 chronic kidney disease, or unspecified chronic kidney disease: Secondary | ICD-10-CM | POA: Diagnosis not present

## 2014-06-28 DIAGNOSIS — Z7982 Long term (current) use of aspirin: Secondary | ICD-10-CM | POA: Insufficient documentation

## 2014-06-28 DIAGNOSIS — E785 Hyperlipidemia, unspecified: Secondary | ICD-10-CM | POA: Diagnosis not present

## 2014-06-28 HISTORY — PX: TRIGGER FINGER RELEASE: SHX641

## 2014-06-28 LAB — POCT I-STAT, CHEM 8
BUN: 42 mg/dL — ABNORMAL HIGH (ref 6–23)
Calcium, Ion: 0.83 mmol/L — ABNORMAL LOW (ref 1.13–1.30)
Chloride: 108 mmol/L (ref 96–112)
Creatinine, Ser: 1.3 mg/dL (ref 0.50–1.35)
GLUCOSE: 130 mg/dL — AB (ref 70–99)
HEMATOCRIT: 48 % (ref 39.0–52.0)
HEMOGLOBIN: 16.3 g/dL (ref 13.0–17.0)
Potassium: 4.6 mmol/L (ref 3.5–5.1)
SODIUM: 134 mmol/L — AB (ref 135–145)
TCO2: 18 mmol/L (ref 0–100)

## 2014-06-28 LAB — GLUCOSE, CAPILLARY: Glucose-Capillary: 161 mg/dL — ABNORMAL HIGH (ref 70–99)

## 2014-06-28 SURGERY — RELEASE, A1 PULLEY, FOR TRIGGER FINGER
Anesthesia: Monitor Anesthesia Care | Site: Finger | Laterality: Right

## 2014-06-28 MED ORDER — MIDAZOLAM HCL 2 MG/2ML IJ SOLN
INTRAMUSCULAR | Status: AC
  Filled 2014-06-28: qty 2

## 2014-06-28 MED ORDER — HYDROCODONE-ACETAMINOPHEN 5-325 MG PO TABS: ORAL_TABLET | ORAL | Status: DC

## 2014-06-28 MED ORDER — ACETAMINOPHEN 325 MG PO TABS: 325.0000 mg | ORAL_TABLET | ORAL | Status: DC | PRN

## 2014-06-28 MED ORDER — ACETAMINOPHEN 160 MG/5ML PO SOLN: 325.0000 mg | ORAL | Status: DC | PRN

## 2014-06-28 MED ORDER — CHLORHEXIDINE GLUCONATE 4 % EX LIQD: 60.0000 mL | Freq: Once | CUTANEOUS | Status: DC

## 2014-06-28 MED ORDER — FENTANYL CITRATE 0.05 MG/ML IJ SOLN
50.0000 ug | INTRAMUSCULAR | Status: DC | PRN
  Administered 2014-06-28: 25 ug via INTRAVENOUS

## 2014-06-28 MED ORDER — FENTANYL CITRATE 0.05 MG/ML IJ SOLN
INTRAMUSCULAR | Status: AC
  Filled 2014-06-28: qty 4

## 2014-06-28 MED ORDER — VANCOMYCIN HCL IN DEXTROSE 500-5 MG/100ML-% IV SOLN
INTRAVENOUS | Status: AC
  Filled 2014-06-28: qty 100

## 2014-06-28 MED ORDER — OXYCODONE HCL 5 MG/5ML PO SOLN: 5.0000 mg | Freq: Once | ORAL | Status: DC | PRN

## 2014-06-28 MED ORDER — BUPIVACAINE HCL (PF) 0.25 % IJ SOLN
INTRAMUSCULAR | Status: AC
  Filled 2014-06-28: qty 30

## 2014-06-28 MED ORDER — MIDAZOLAM HCL 2 MG/2ML IJ SOLN: 1.0000 mg | INTRAMUSCULAR | Status: DC | PRN

## 2014-06-28 MED ORDER — BUPIVACAINE HCL (PF) 0.25 % IJ SOLN
INTRAMUSCULAR | Status: DC | PRN
  Administered 2014-06-28: 4 mL

## 2014-06-28 MED ORDER — LACTATED RINGERS IV SOLN
INTRAVENOUS | Status: DC | PRN
  Administered 2014-06-28: 10:00:00 via INTRAVENOUS

## 2014-06-28 MED ORDER — PROPOFOL INFUSION 10 MG/ML OPTIME
INTRAVENOUS | Status: DC | PRN
  Administered 2014-06-28: 75 ug/kg/min via INTRAVENOUS

## 2014-06-28 MED ORDER — PROPOFOL 10 MG/ML IV BOLUS
INTRAVENOUS | Status: AC
  Filled 2014-06-28: qty 20

## 2014-06-28 MED ORDER — VANCOMYCIN HCL IN DEXTROSE 1-5 GM/200ML-% IV SOLN
INTRAVENOUS | Status: AC
  Filled 2014-06-28: qty 200

## 2014-06-28 MED ORDER — SODIUM CHLORIDE 0.9 % IV SOLN
1500.0000 mg | INTRAVENOUS | Status: AC
  Administered 2014-06-28 (×2): 1500 mg via INTRAVENOUS

## 2014-06-28 MED ORDER — OXYCODONE HCL 5 MG PO TABS: 5.0000 mg | ORAL_TABLET | Freq: Once | ORAL | Status: DC | PRN

## 2014-06-28 MED ORDER — FENTANYL CITRATE 0.05 MG/ML IJ SOLN: 25.0000 ug | INTRAMUSCULAR | Status: DC | PRN

## 2014-06-28 MED ORDER — LACTATED RINGERS IV SOLN
INTRAVENOUS | Status: DC
  Administered 2014-06-28: 10:00:00 via INTRAVENOUS

## 2014-06-28 SURGICAL SUPPLY — 39 items
BANDAGE COBAN STERILE 2 (GAUZE/BANDAGES/DRESSINGS) ×3 IMPLANT
BLADE MINI RND TIP GREEN BEAV (BLADE) IMPLANT
BLADE SURG 15 STRL LF DISP TIS (BLADE) ×2 IMPLANT
BLADE SURG 15 STRL SS (BLADE) ×6
BNDG CMPR 9X4 STRL LF SNTH (GAUZE/BANDAGES/DRESSINGS)
BNDG CONFORM 2 STRL LF (GAUZE/BANDAGES/DRESSINGS) ×3 IMPLANT
BNDG ESMARK 4X9 LF (GAUZE/BANDAGES/DRESSINGS) IMPLANT
CHLORAPREP W/TINT 26ML (MISCELLANEOUS) ×3 IMPLANT
CORDS BIPOLAR (ELECTRODE) ×3 IMPLANT
COVER BACK TABLE 60X90IN (DRAPES) ×3 IMPLANT
COVER MAYO STAND STRL (DRAPES) ×3 IMPLANT
CUFF TOURNIQUET SINGLE 18IN (TOURNIQUET CUFF) ×3 IMPLANT
DRAPE EXTREMITY T 121X128X90 (DRAPE) ×3 IMPLANT
DRAPE SURG 17X23 STRL (DRAPES) ×3 IMPLANT
GAUZE SPONGE 4X4 12PLY STRL (GAUZE/BANDAGES/DRESSINGS) ×3 IMPLANT
GAUZE XEROFORM 1X8 LF (GAUZE/BANDAGES/DRESSINGS) ×3 IMPLANT
GLOVE BIO SURGEON STRL SZ 6.5 (GLOVE) ×1 IMPLANT
GLOVE BIO SURGEON STRL SZ7.5 (GLOVE) ×3 IMPLANT
GLOVE BIO SURGEONS STRL SZ 6.5 (GLOVE) ×1
GLOVE BIOGEL PI IND STRL 7.5 (GLOVE) IMPLANT
GLOVE BIOGEL PI IND STRL 8 (GLOVE) ×1 IMPLANT
GLOVE BIOGEL PI INDICATOR 7.5 (GLOVE) ×2
GLOVE BIOGEL PI INDICATOR 8 (GLOVE) ×2
GLOVE SURG SS PI 7.5 STRL IVOR (GLOVE) ×2 IMPLANT
GOWN STRL REUS W/ TWL LRG LVL3 (GOWN DISPOSABLE) ×1 IMPLANT
GOWN STRL REUS W/TWL LRG LVL3 (GOWN DISPOSABLE) ×3
GOWN STRL REUS W/TWL XL LVL3 (GOWN DISPOSABLE) ×3 IMPLANT
NDL HYPO 25X1 1.5 SAFETY (NEEDLE) IMPLANT
NEEDLE HYPO 25X1 1.5 SAFETY (NEEDLE) IMPLANT
NS IRRIG 1000ML POUR BTL (IV SOLUTION) ×3 IMPLANT
PACK BASIN DAY SURGERY FS (CUSTOM PROCEDURE TRAY) ×3 IMPLANT
PADDING CAST ABS 4INX4YD NS (CAST SUPPLIES) ×2
PADDING CAST ABS COTTON 4X4 ST (CAST SUPPLIES) ×1 IMPLANT
STOCKINETTE 4X48 STRL (DRAPES) ×3 IMPLANT
SUT ETHILON 4 0 PS 2 18 (SUTURE) ×3 IMPLANT
SYR BULB 3OZ (MISCELLANEOUS) ×3 IMPLANT
SYR CONTROL 10ML LL (SYRINGE) IMPLANT
TOWEL OR 17X24 6PK STRL BLUE (TOWEL DISPOSABLE) ×6 IMPLANT
UNDERPAD 30X30 INCONTINENT (UNDERPADS AND DIAPERS) ×3 IMPLANT

## 2014-06-28 NOTE — Discharge Instructions (Addendum)

## 2014-06-28 NOTE — Op Note (Signed)
NAMESERENA, [name] NO.:  [id number]  MEDICAL RECORD NO.:  [id number]  LOCATION:                                 FACILITY:  PHYSICIAN:  Leanora Cover, MD        DATE OF BIRTH:  1940/12/26  DATE OF PROCEDURE:  06/28/2014 DATE OF DISCHARGE:                              OPERATIVE REPORT   PREOPERATIVE DIAGNOSIS:  Right long finger trigger digit.  POSTOPERATIVE DIAGNOSIS:  Right long finger trigger digit.  PROCEDURE:  Right long finger trigger release.  SURGEON:  Leanora Cover, MD  ASSISTANT:  None.  ANESTHESIA:  Bier block with sedation.  IV FLUIDS:  Per anesthesia flow sheet.  ESTIMATED BLOOD LOSS:  Minimal.  COMPLICATIONS:  None.  SPECIMENS:  None.  TOURNIQUET TIME:  19 minutes.  DISPOSITION:  Stable to PACU.  INDICATIONS:  Mr. [name] is a 74 year old right-hand dominant male, who has had triggering of the right long finger.  It has been injected twice with recurrence.  He wished to have it released.  Risks, benefits, and alternatives of surgery were discussed including risk of blood loss, infection, damage to nerves, vessels, tendons, ligaments, bone, failure of surgery, need for additional surgery, complications with wound healing, continued pain, recurrent triggering.  He voiced understanding of these risks and elected to proceed.  OPERATIVE COURSE:  After being identified preoperatively by myself, the patient and I agreed upon procedure and site of procedure.  Surgical site was marked.  The risks, benefits, and alternatives of surgery were reviewed and he wished to proceed.  Surgical consent had been signed. He was given IV vancomycin as preoperative antibiotic prophylaxis due to penicillin allergy.  He was transferred to the operating room and placed on the operating room table in supine position with the right upper extremity on arm board.  Bier block anesthesia was induced by anesthesiologist.  The right upper extremity was prepped and  draped in normal sterile orthopedic fashion.  A surgical pause was performed between surgeons, anesthesia, and operating room staff, and all were in agreement as to the patient, procedure, and site of procedure.  The tourniquet had been inflated for the Bier block.  Incision was made over the MP joint of the right long finger volarly.  This was carried into subcutaneous tissues by spreading technique.  Bipolar electrocautery was used to obtain hemostasis.  Care was taken to ensure the neurovascular structures.  The A1 pulley was identified and sharply incised.  There was synovial fluid within the tendon sheath.  The proximal 1-2 mm of the A2 pulley were incised as well.  The tendons were brought through the wound and separated.  There was adherence between them.  There was no recurrence of triggering.  The finger was placed through a range of motion.  There was no recurrence of triggering. There was some contracture at the PIP joint that was not passively correctable.  The wound was copiously irrigated with sterile saline and closed with 4-0 nylon in a horizontal mattress fashion.  It was injected with 4.5 mL of 0.25% plain Marcaine to aid in postoperative analgesia. It was then dressed with sterile Xeroform, 4x4s, and wrapped with  a  Coban dressing lightly.  Tourniquet was deflated at 19 minutes. Fingertips were pink with brisk capillary refill after deflation of the tourniquet.  Operative drapes were broken down and the patient was awoken from anesthesia safely.  He was transferred back to stretcher and taken to PACU in stable condition.  I will see him back in the office in 1 week for postoperative followup.  I will give him Norco 5/325, 1-2 p.o. q.6 hours p.r.n. pain, dispensed #30.     Leanora Cover, MD     KK/MEDQ  D:  06/28/2014  T:  06/28/2014  Job:  [id number]

## 2014-06-28 NOTE — Anesthesia Preprocedure Evaluation (Addendum)
Anesthesia Evaluation  Patient identified by MRN, date of birth, ID band Patient awake    Reviewed: Allergy & Precautions, NPO status , Patient's Chart, lab work & pertinent test results, reviewed documented beta blocker date and time   History of Anesthesia Complications Negative for: history of anesthetic complications  Airway Mallampati: III  TM Distance: >3 FB Neck ROM: Full    Dental  (+) Teeth Intact   Pulmonary shortness of breath and with exertion, neg sleep apnea, neg COPDformer smoker,  breath sounds clear to auscultation        Cardiovascular hypertension, Pt. on medications and Pt. on home beta blockers - angina+ CAD, + CABG and + Peripheral Vascular Disease - CHF - dysrhythmias Rhythm:Regular     Neuro/Psych Peripheral neuropathy, b/l CEA  Neuromuscular disease negative psych ROS   GI/Hepatic negative GI ROS, Neg liver ROS,   Endo/Other  diabetes, Type 2Morbid obesity  Renal/GU Renal InsufficiencyRenal disease     Musculoskeletal  (+) Arthritis -,   Abdominal   Peds  Hematology   Anesthesia Other Findings   Reproductive/Obstetrics                            Anesthesia Physical Anesthesia Plan  ASA: III  Anesthesia Plan: MAC and Bier Block   Post-op Pain Management:    Induction: Intravenous  Airway Management Planned: Natural Airway and Simple Face Mask  Additional Equipment: None  Intra-op Plan:   Post-operative Plan: Extubation in OR  Informed Consent: I have reviewed the patients History and Physical, chart, labs and discussed the procedure including the risks, benefits and alternatives for the proposed anesthesia with the patient or authorized representative who has indicated his/her understanding and acceptance.   Dental advisory given  Plan Discussed with: CRNA and Surgeon  Anesthesia Plan Comments:        Anesthesia Quick Evaluation

## 2014-06-28 NOTE — Transfer of Care (Signed)
Immediate Anesthesia Transfer of Care Note  Patient: [name]  Procedure(s) Performed: Procedure(s): RIGHT LONG TRIGGER RELEASE  (Right)  Patient Location: PACU  Anesthesia Type:MAC and Bier block  Level of Consciousness: awake, alert  and oriented  Airway & Oxygen Therapy: Patient Spontanous Breathing and Patient connected to face mask oxygen  Post-op Assessment: Report given to RN and Post -op Vital signs reviewed and stable  Post vital signs: Reviewed and stable  Last Vitals:  Filed Vitals:   06/28/14 0940  BP: 100/48  Pulse: 48  Temp: 36.8 C  Resp: 16    Complications: No apparent anesthesia complications

## 2014-06-28 NOTE — Brief Op Note (Signed)
06/28/2014  10:57 AM  PATIENT:  [name]  74 y.o. male  PRE-OPERATIVE DIAGNOSIS:  right long trigger digit   POST-OPERATIVE DIAGNOSIS:  right long trigger digit   PROCEDURE:  Procedure(s): RIGHT LONG TRIGGER RELEASE  (Right)  SURGEON:  Surgeon(s) and Role:    * Leanora Cover, MD - Primary  PHYSICIAN ASSISTANT:   ASSISTANTS: none   ANESTHESIA:   Bier block with sedation  EBL:     BLOOD ADMINISTERED:none  DRAINS: none   LOCAL MEDICATIONS USED:  MARCAINE     SPECIMEN:  No Specimen  DISPOSITION OF SPECIMEN:  N/A  COUNTS:  YES  TOURNIQUET:   Total Tourniquet Time Documented: Forearm (Right) - 19 minutes Total: Forearm (Right) - 19 minutes   DICTATION: .Other Dictation: Dictation Number [id number]  PLAN OF CARE: Discharge to home after PACU  PATIENT DISPOSITION:  PACU - hemodynamically stable.

## 2014-06-28 NOTE — Anesthesia Postprocedure Evaluation (Signed)
  Anesthesia Post-op Note  Patient: [name]  Procedure(s) Performed: Procedure(s): RIGHT LONG TRIGGER RELEASE  (Right)  Patient Location: PACU  Anesthesia Type:MAC and Bier block  Level of Consciousness: awake  Airway and Oxygen Therapy: Patient Spontanous Breathing  Post-op Pain: none  Post-op Assessment: Post-op Vital signs reviewed, Patient's Cardiovascular Status Stable, Respiratory Function Stable, Patent Airway, No signs of Nausea or vomiting and Pain level controlled  Post-op Vital Signs: Reviewed  Last Vitals:  Filed Vitals:   06/28/14 1145  BP: 98/38  Pulse: 43  Temp:   Resp: 11    Complications: No apparent anesthesia complications

## 2014-06-28 NOTE — H&P (Signed)
[name] is an 74 y.o. male.   Chief Complaint: right long trigger digit HPI: 74 yo rhd male with triggering right long finger.  This has recurred after injection.  He wishes to have a trigger release of right long finger.  Past Medical History  Diagnosis Date  . Chest pain   . Muscle pain   . Diarrhea   . Hyperlipidemia     takes Tricor and Lipitor daily  . Hyperlipidemia   . Atherosclerotic heart disease   . PVD (peripheral vascular disease)   . Peripheral neuropathy   . Seasonal allergies     takes Mucinex and Zyrtec prn  . Nasal polyps     hx of  . Edema     right leg knee down swollen since fall 3 wks ago  . Back pain   . GERD (gastroesophageal reflux disease)     Rolaids as needed-occ reflux  . Colon polyps   . Renal insufficiency   . Urinary frequency     urgency/takes Vesicare daily  . Kidney stone     35+yrs ago  . Pneumonia     as a child  . Hypertension     takes Altace,Amlodipine,and Nadolol daily  . Peripheral edema     wears knee length hose-told by podiatrist to wear these  . Peripheral neuropathy     takes Gabapentin daily  . Arthritis   . Bruises easily     takes Pletal daily and ASA 325mg  daily  . Hemorrhoids   . Cyst     on perineum;takes Doxycycline daily  . History of kidney stones     at age 41   . Diabetes mellitus     takes Glipizide,Metformin,and Actos daily  . Cataract immature     right    Past Surgical History  Procedure Laterality Date  . Aortobifemoral bypass    . Reimplantation of inferior mesenteric artery    . Repair of infrarenal abdominal aortic aneurysm    . Shoulder arthroscopy w/ rotator cuff repair      right and left-open procedures  . Tonsillectomy      at age 73  . Knee arthroscopy  2011  . Cataract surgery      left  . Wisdom teeth extracted      as a teenager  . Carotid endarterectomy  04/08/2011    left CEA  . Endarterectomy  04/08/2011    Procedure: ENDARTERECTOMY CAROTID;  Surgeon: Elam Dutch, MD;  Location: [facility] OR;  Service: Vascular;  Laterality: Left;  with patch angioplasty  . Cardiac catheterization  most recent 05/2011    total of 4  . Colonoscopy    . Coronary artery bypass graft  09/09/2011    Procedure: CORONARY ARTERY BYPASS GRAFTING (CABG);  Surgeon: Gaye Pollack, MD;  Location: [place];  Service: Open Heart Surgery;  Laterality: N/A;  Coronary artery bypass grafting  x three with Right saphenous vein harvested endoscopically and left internal mammary artery  . Endarterectomy  09/09/2011    Procedure: ENDARTERECTOMY CAROTID;  Surgeon: Elam Dutch, MD;  Location: [facility] OR;  Service: Vascular;  Laterality: Right;  with patch angioplasty  . Colonoscopy with propofol N/A 08/22/2013    Procedure: COLONOSCOPY WITH PROPOFOL;  Surgeon: Garlan Fair, MD;  Location: [facility] ENDOSCOPY;  Service: Endoscopy;  Laterality: N/A;  . Carotid angiogram N/A 06/05/2011    Procedure: CAROTID ANGIOGRAM;  Surgeon: Elam Dutch, MD;  Location: [facility] CATH LAB;  Service: Cardiovascular;  Laterality: N/A;  . Left heart catheterization with coronary angiogram N/A 07/09/2011    Procedure: LEFT HEART CATHETERIZATION WITH CORONARY ANGIOGRAM;  Surgeon: Sinclair Grooms, MD;  Location: [facility] CATH LAB;  Service: Cardiovascular;  Laterality: N/A;    Family History  Problem Relation Age of Onset  . Heart disease Mother   . Heart disease Father   . Anesthesia problems Neg Hx   . Hypotension Neg Hx   . Malignant hyperthermia Neg Hx   . Pseudochol deficiency Neg Hx    Social History:  reports that he quit smoking about 8 years ago. His smoking use included Cigarettes. He quit after 40 years of use. He does not have any smokeless tobacco history on file. He reports that he drinks alcohol. He reports that he does not use illicit drugs.  Allergies:  Allergies  Allergen Reactions  . Amoxicillin-Pot Clavulanate Nausea Only    Medications Prior to Admission  Medication Sig Dispense Refill  . aspirin 325  MG EC tablet Take 325 mg by mouth every evening.     Marland Kitchen atorvastatin (LIPITOR) 20 MG tablet Take 20 mg by mouth every evening.     Marland Kitchen b complex vitamins capsule Take 1 capsule by mouth daily.      . Cetirizine HCl (ZYRTEC ALLERGY PO) Take 10 tablets by mouth daily as needed (allergies). For allergies    . cilostazol (PLETAL) 100 MG tablet Take 100 mg by mouth 2 (two) times daily.     . Cyanocobalamin (VITAMIN B 12 PO) Take 1 tablet by mouth every other day.    . fenofibrate (TRICOR) 145 MG tablet Take 145 mg by mouth every morning.    . furosemide (LASIX) 40 MG tablet Take 40 mg by mouth every other day.    . gabapentin (NEURONTIN) 300 MG capsule Take 300 mg by mouth 2 (two) times daily. Sometimes takes addition dose    . glipiZIDE (GLUCOTROL) 10 MG tablet Take 10 mg by mouth every morning.     . Loperamide HCl (IMODIUM PO) Take 2 mg by mouth daily as needed. For diarrhea    . metFORMIN (GLUCOPHAGE) 500 MG tablet Take 500 mg by mouth 2 (two) times daily with a meal.     . metoprolol (LOPRESSOR) 50 MG tablet Take 50 mg by mouth 2 (two) times daily.    . pioglitazone (ACTOS) 30 MG tablet Take 15 mg by mouth every morning.     . potassium chloride SA (K-DUR,KLOR-CON) 20 MEQ tablet Take 20 mEq by mouth every other day. Takes with lasix    . pseudoephedrine-guaifenesin (MUCINEX D) 60-600 MG per tablet Take 1 tablet by mouth every 12 (twelve) hours as needed for congestion. Congestion    . ramipril (ALTACE) 10 MG tablet Take 10 mg by mouth every evening.     . sodium chloride (OCEAN) 0.65 % SOLN nasal spray Place 1 spray into both nostrils 2 (two) times daily as needed for congestion.    . temazepam (RESTORIL) 15 MG capsule Take 15 mg by mouth at bedtime as needed. For sleep    . methylcellulose (ARTIFICIAL TEARS) 1 % ophthalmic solution Place 1 drop into both eyes 2 (two) times daily as needed (dry eyes).    Marland Kitchen NITROSTAT 0.4 MG SL tablet DISSOLVE ONE TABLET UNDER TONGUE AS DIRECTED AS NEEDED FOR CHEST  PAIN. 25 tablet 5  . simethicone (MYLICON) 80 MG chewable tablet Chew 80-160 mg by mouth every 6 (six) hours as needed  for flatulence.      Results for orders placed or performed during the hospital encounter of 06/28/14 (from the past 48 hour(s))  I-STAT, chem 8     Status: Abnormal   Collection Time: 06/28/14 10:00 AM  Result Value Ref Range   Sodium 134 (L) 135 - 145 mmol/L   Potassium 4.6 3.5 - 5.1 mmol/L   Chloride 108 96 - 112 mmol/L   BUN 42 (H) 6 - 23 mg/dL   Creatinine, Ser 1.30 0.50 - 1.35 mg/dL   Glucose, Bld 130 (H) 70 - 99 mg/dL   Calcium, Ion 0.83 (L) 1.13 - 1.30 mmol/L   TCO2 18 0 - 100 mmol/L   Hemoglobin 16.3 13.0 - 17.0 g/dL   HCT 48.0 39.0 - 52.0 %    No results found.   A comprehensive review of systems was negative except for: Eyes: positive for contacts/glasses Respiratory: positive for cough Integument/breast: positive for skin lesion(s) Hematologic/lymphatic: positive for easy bruising  Blood pressure 100/48, pulse 48, temperature 98.3 F (36.8 C), temperature source Oral, resp. rate 16, height 5\' 10"  (1.778 m), weight 127.007 kg (280 lb), SpO2 96 %.  General appearance: alert, cooperative and appears stated age Head: Normocephalic, without obvious abnormality, atraumatic Neck: supple, symmetrical, trachea midline Resp: clear to auscultation bilaterally Cardio: regular rate and rhythm GI: non tender Extremities: intact sensation and capillary refill all digits.  +epl/fpl/io. Pulses: 2+ and symmetric Skin: Skin color, texture, turgor normal. No rashes or lesions Neurologic: Grossly normal Incision/Wound: none  Assessment/Plan Right long finger trigger digit.  Non operative and operative treatment options were discussed with the patient and patient wishes to proceed with operative treatment. Risks, benefits, and alternatives of surgery were discussed and the patient agrees with the plan of care.   Latoi Giraldo R 06/28/2014, 10:07 AM

## 2014-06-28 NOTE — Op Note (Signed)
[id number] 

## 2014-06-29 ENCOUNTER — Encounter (HOSPITAL_BASED_OUTPATIENT_CLINIC_OR_DEPARTMENT_OTHER): Payer: Self-pay | Admitting: Orthopedic Surgery

## 2014-07-25 ENCOUNTER — Encounter: Payer: Self-pay | Admitting: Vascular Surgery

## 2014-07-26 ENCOUNTER — Ambulatory Visit (HOSPITAL_COMMUNITY)
Admission: RE | Admit: 2014-07-26 | Discharge: 2014-07-26 | Disposition: A | Discharge status: Home / Self Care | Payer: Medicare Other | Source: Ambulatory Visit | Attending: Vascular Surgery | Admitting: Vascular Surgery

## 2014-07-26 ENCOUNTER — Encounter: Payer: Self-pay | Admitting: Vascular Surgery

## 2014-07-26 ENCOUNTER — Ambulatory Visit (INDEPENDENT_AMBULATORY_CARE_PROVIDER_SITE_OTHER): Payer: Medicare Other | Admitting: Vascular Surgery

## 2014-07-26 ENCOUNTER — Ambulatory Visit (INDEPENDENT_AMBULATORY_CARE_PROVIDER_SITE_OTHER)
Admission: RE | Admit: 2014-07-26 | Discharge: 2014-07-26 | Disposition: A | Discharge status: Home / Self Care | Payer: Medicare Other | Source: Ambulatory Visit | Attending: Vascular Surgery | Admitting: Vascular Surgery

## 2014-07-26 VITALS — BP 146/65 | HR 55 | Resp 16 | Ht 71.0 in | Wt 287.0 lb

## 2014-07-26 DIAGNOSIS — I739 Peripheral vascular disease, unspecified: Secondary | ICD-10-CM | POA: Insufficient documentation

## 2014-07-26 DIAGNOSIS — Z9889 Other specified postprocedural states: Secondary | ICD-10-CM | POA: Insufficient documentation

## 2014-07-26 DIAGNOSIS — Z48812 Encounter for surgical aftercare following surgery on the circulatory system: Secondary | ICD-10-CM

## 2014-07-26 DIAGNOSIS — I6529 Occlusion and stenosis of unspecified carotid artery: Secondary | ICD-10-CM

## 2014-07-26 NOTE — Progress Notes (Addendum)
HISTORY AND PHYSICAL     CC:  Follow up carotid duplex scan  Referring Provider:  Irven Shelling, MD  HPI: This is a 74 y.o. male who has known carotid stenosis is here for f/u carotid duplex scan.  Denies amaurosis fugax, paresthesias, or hemiparesis.  He is s/p right CEA and aorta-innominate graft on 09/09/11 and left CEA 04/08/11.    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    He states that he does have pain, numbness, and tingling in his feet.  He has pain in his legs when he walks and he can walk ~ one city block.  He states he does have some swelling in his feet.  He does have a hx of diabetes, which he takes Metformin and Glucotrol.  He is on a statin for his hypercholesterolemia.  He does take a beta blocker and ACEI for his HTN.    He states that the [employer] just purchased a recumbent stair climber and hopes to get back into this routine 3x/week.  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 He has known short distance chronic claudication of about one block. This has been stable.  Past Medical History  Diagnosis Date  . Chest pain   . Muscle pain   . Diarrhea   . Hyperlipidemia     takes Tricor and Lipitor daily  . Hyperlipidemia   . Atherosclerotic heart disease   . PVD (peripheral vascular disease)   . Peripheral neuropathy   . Seasonal allergies     takes Mucinex and Zyrtec prn  . Nasal polyps     hx of  . Edema     right leg knee down swollen since fall 3 wks ago  . Back pain   . GERD (gastroesophageal reflux disease)     Rolaids as needed-occ reflux  . Colon polyps   . Renal insufficiency   . Urinary frequency     urgency/takes Vesicare daily  . Kidney stone     35+yrs ago  . Pneumonia     as a child  . Hypertension     takes Altace,Amlodipine,and  Nadolol daily  . Peripheral edema     wears knee length hose-told by podiatrist to wear these  . Peripheral neuropathy     takes Gabapentin daily  . Arthritis   . Bruises easily     takes Pletal daily and ASA 325mg  daily  . Hemorrhoids   . Cyst     on perineum;takes Doxycycline daily  . History of kidney stones     at age 60   . Diabetes mellitus     takes Glipizide,Metformin,and Actos daily  . Cataract immature     right    Past Surgical History  Procedure Laterality Date  . Aortobifemoral bypass    . Reimplantation of inferior mesenteric artery    . Repair of infrarenal abdominal aortic aneurysm    . Shoulder arthroscopy w/ rotator cuff repair      right and left-open procedures  . Tonsillectomy      at age 73  . Knee arthroscopy  2011  . Cataract surgery      left  . Wisdom teeth extracted      as a teenager  .  Carotid endarterectomy  04/08/2011    left CEA  . Endarterectomy  04/08/2011    Procedure: ENDARTERECTOMY CAROTID;  Surgeon: Elam Dutch, MD;  Location: [facility] OR;  Service: Vascular;  Laterality: Left;  with patch angioplasty  . Cardiac catheterization  most recent 05/2011    total of 4  . Colonoscopy    . Coronary artery bypass graft  09/09/2011    Procedure: CORONARY ARTERY BYPASS GRAFTING (CABG);  Surgeon: Gaye Pollack, MD;  Location: [place];  Service: Open Heart Surgery;  Laterality: N/A;  Coronary artery bypass grafting  x three with Right saphenous vein harvested endoscopically and left internal mammary artery  . Endarterectomy  09/09/2011    Procedure: ENDARTERECTOMY CAROTID;  Surgeon: Elam Dutch, MD;  Location: [facility] OR;  Service: Vascular;  Laterality: Right;  with patch angioplasty  . Colonoscopy with propofol N/A 08/22/2013    Procedure: COLONOSCOPY WITH PROPOFOL;  Surgeon: Garlan Fair, MD;  Location: [facility] ENDOSCOPY;  Service: Endoscopy;  Laterality: N/A;  . Carotid angiogram N/A 06/05/2011    Procedure: CAROTID ANGIOGRAM;  Surgeon: Elam Dutch, MD;  Location: [facility] CATH LAB;  Service: Cardiovascular;  Laterality: N/A;  . Left heart catheterization with coronary angiogram N/A 07/09/2011    Procedure: LEFT HEART CATHETERIZATION WITH CORONARY ANGIOGRAM;  Surgeon: Sinclair Grooms, MD;  Location: [facility] CATH LAB;  Service: Cardiovascular;  Laterality: N/A;  . Trigger finger release Right 06/28/2014    Procedure: RIGHT LONG TRIGGER RELEASE ;  Surgeon: Leanora Cover, MD;  Location: [place];  Service: Orthopedics;  Laterality: Right;    Allergies  Allergen Reactions  . Amoxicillin-Pot Clavulanate Nausea Only    Current Outpatient Prescriptions  Medication Sig Dispense Refill  . aspirin 325 MG EC tablet Take 325 mg by mouth every evening.     Marland Kitchen atorvastatin (LIPITOR) 20 MG tablet Take 20 mg by mouth every evening.     Marland Kitchen b complex vitamins capsule Take 1 capsule by mouth daily.      . Cetirizine HCl (ZYRTEC ALLERGY PO) Take 10 tablets by mouth daily as needed (allergies). For allergies    . cilostazol (PLETAL) 100 MG tablet Take 100 mg by mouth 2 (two) times daily.     . Cyanocobalamin (VITAMIN B 12 PO) Take 1 tablet by mouth every other day.    . fenofibrate (TRICOR) 145 MG tablet Take 145 mg by mouth every morning.    . furosemide (LASIX) 40 MG tablet Take 40 mg by mouth every other day.    . gabapentin (NEURONTIN) 300 MG capsule Take 300 mg by mouth 2 (two) times daily. Sometimes takes addition dose    . glipiZIDE (GLUCOTROL) 10 MG tablet Take 10 mg by mouth every morning.     Marland Kitchen HYDROcodone-acetaminophen (NORCO) 5-325 MG per tablet 1-2 tabs po q6 hours prn pain 30 tablet 0  . Loperamide HCl (IMODIUM PO) Take 2 mg by mouth daily as needed. For diarrhea    . metFORMIN (GLUCOPHAGE) 500 MG tablet Take 500 mg by mouth 2 (two) times daily with a meal.     . methylcellulose (ARTIFICIAL TEARS) 1 % ophthalmic solution Place 1 drop into both eyes 2 (two) times daily as needed (dry eyes).    . metoprolol (LOPRESSOR) 50 MG tablet  Take 50 mg by mouth 2 (two) times daily.    Marland Kitchen NITROSTAT 0.4 MG SL tablet DISSOLVE ONE TABLET UNDER TONGUE AS DIRECTED AS NEEDED FOR CHEST PAIN. 25 tablet  5  . pioglitazone (ACTOS) 30 MG tablet Take 15 mg by mouth every morning.     . potassium chloride SA (K-DUR,KLOR-CON) 20 MEQ tablet Take 20 mEq by mouth every other day. Takes with lasix    . pseudoephedrine-guaifenesin (MUCINEX D) 60-600 MG per tablet Take 1 tablet by mouth every 12 (twelve) hours as needed for congestion. Congestion    . ramipril (ALTACE) 10 MG tablet Take 10 mg by mouth every evening.     . simethicone (MYLICON) 80 MG chewable tablet Chew 80-160 mg by mouth every 6 (six) hours as needed for flatulence.    . sodium chloride (OCEAN) 0.65 % SOLN nasal spray Place 1 spray into both nostrils 2 (two) times daily as needed for congestion.    . temazepam (RESTORIL) 15 MG capsule Take 15 mg by mouth at bedtime as needed. For sleep     No current facility-administered medications for this visit.    Pt's meds include: Statin:  Yes.   Beta Blocker:  Yes.   Aspirin:  Yes.   Other antiplatelets/anticoagulants:  Yes.   Pletal    Family History  Problem Relation Age of Onset  . Heart disease Mother   . Heart disease Father   . Anesthesia problems Neg Hx   . Hypotension Neg Hx   . Malignant hyperthermia Neg Hx   . Pseudochol deficiency Neg Hx     History   Social History  . Marital Status: Married    Spouse Name: N/A  . Number of Children: N/A  . Years of Education: N/A   Occupational History  . Not on file.   Social History Main Topics  . Smoking status: Former Smoker -- 40 years    Types: Cigarettes    Quit date: 10/27/2005  . Smokeless tobacco: Never Used  . Alcohol Use: 0.0 oz/week    2-3 Cans of beer per week     Comment: occasional- weekly  . Drug Use: No  . Sexual Activity: No   Other Topics Concern  . Not on file   Social History Narrative     ROS: [x]  Positive   [ ]  Negative   [ ]  All sytems  reviewed and are negative  Cardiovascular: []  chest pain/pressure []  palpitations []  SOB lying flat []  DOE [x]  pain in legs while walking [x]  pain in feet when lying flat []  hx of DVT []  hx of phlebitis [x]  swelling in legs []  varicose veins  Pulmonary: []  productive cough []  asthma []  wheezing  Neurologic: [x]  weakness in []  arms [x]  legs [x]  numbness in []  arms [x]  legs [] difficulty speaking or slurred speech []  temporary loss of vision in one eye []  dizziness  Hematologic: []  bleeding problems []  problems with blood clotting easily  GI []  vomiting blood []  blood in stool  GU: []  burning with urination []  blood in urine  Psychiatric: []  hx of major depression  Integumentary: []  rashes []  ulcers  Constitutional: []  fever []  chills   PHYSICAL EXAMINATION:  Filed Vitals:   07/26/14 1605  BP: 146/65  Pulse: 55  Resp:    Body mass index is 40.05 kg/(m^2).  General:  WDWN in NAD Gait: Normal HENT: WNL; normocephalic Pulmonary: normal non-labored breathing , without Rales, rhonchi,  wheezing Cardiac: regular, without  Murmurs, rubs or gallops; without carotid bruits Abdomen: soft, NT, no masses Skin: without rashes,  without ulcers  Vascular Exam/Pulses:  Right Left  Radial 2+ (normal) 2+ (normal)  Carotid 2+ (normal) 2+ (normal)  DP Unable to palpate  Unable to palpate   PT Unable to palpate  Unable to palpate    Extremities: without ischemic changes, without Gangrene , without cellulitis; without open wounds;  Musculoskeletal: without muscle wasting or atrophy  Neurologic: A&O X 3; Appropriate Affect ; SENSATION: normal; MOTOR FUNCTION:  moving all extremities equally. Speech is fluent/normal   Non-Invasive Vascular Imaging: Carotid Duplex Scan:  07/26/2014  1.  No significant stenosis of the bilateral external or common carotid arteries 2.  Velocity of 287cm/s noted in the left proximal subclavian artery with antegrade left vertebral  artery flow demonstrating early systolic deceleration  -Patent bilateral carotid endarterectomy sites with no bilateral ICA stenosis -No significant change in bilateral carotid arteries noted when compared to the previoius exam on 01/25/14  ABI's 07/26/14: Right 0.65 Left:  1.01  Previous ABI's 01/25/14: Right:  0.78 Left:  0.64 ASSESSMENT: 73 y.o. male here for f/u carotid duplex scan. He is s/p right CEA and aorta-innominate graft on 09/09/11 and left CEA 04/08/11. He is s/p aortobifemoral bypass grafting in  2007.  PLAN: -pt continues to have patent bilateral carotid endarterectomy sites with no stenosis. -his ABI's are relatively stable.  He is going to find a podiatrist to help with his ingrown toenail.  Pt knows that if he develops a sore on his feet that won't heal, he is to return to see Dr. Oneida Alar.   -he will continue his walking/recumbent stair stepper regimen -he continues to do a lot reading, which hopefully will help slow his short term memory loss -we will see him back in 6 months with f/u ABI's and carotid duplex scan.   Leontine Locket, PA-C [facility] [phone]  Clinic MD:   Pt seen and examined in conjunction with Dr. Oneida Alar  History and exam findings as above. Patent innominate bypass patent carotid endarterectomy no significant recurrent stenosis stable claudication with reasonable bilateral ABIs.  Follow-up 6 months carotid duplex scan bilateral ABIs  Ruta Hinds, MD [facility] [place] Office: [phone] Pager: [phone]

## 2014-07-26 NOTE — Addendum Note (Signed)
Addended by: Mena Goes on: 07/26/2014 05:01 PM   Modules accepted: Orders

## 2014-11-19 ENCOUNTER — Other Ambulatory Visit: Payer: Self-pay

## 2015-01-24 ENCOUNTER — Other Ambulatory Visit (HOSPITAL_COMMUNITY): Payer: Medicare Other

## 2015-01-24 ENCOUNTER — Ambulatory Visit: Payer: Medicare Other | Admitting: Vascular Surgery

## 2015-01-24 ENCOUNTER — Encounter (HOSPITAL_COMMUNITY): Payer: Medicare Other

## 2015-01-30 ENCOUNTER — Encounter: Payer: Self-pay | Admitting: Vascular Surgery

## 2015-01-31 ENCOUNTER — Ambulatory Visit (HOSPITAL_COMMUNITY)
Admission: RE | Admit: 2015-01-31 | Discharge: 2015-01-31 | Disposition: A | Discharge status: Home / Self Care | Payer: Medicare Other | Source: Ambulatory Visit | Attending: Vascular Surgery | Admitting: Vascular Surgery

## 2015-01-31 ENCOUNTER — Ambulatory Visit (INDEPENDENT_AMBULATORY_CARE_PROVIDER_SITE_OTHER)
Admission: RE | Admit: 2015-01-31 | Discharge: 2015-01-31 | Disposition: A | Discharge status: Home / Self Care | Payer: Medicare Other | Source: Ambulatory Visit | Attending: Vascular Surgery | Admitting: Vascular Surgery

## 2015-01-31 ENCOUNTER — Ambulatory Visit (INDEPENDENT_AMBULATORY_CARE_PROVIDER_SITE_OTHER): Payer: Medicare Other | Admitting: Vascular Surgery

## 2015-01-31 ENCOUNTER — Encounter: Payer: Self-pay | Admitting: Vascular Surgery

## 2015-01-31 VITALS — BP 148/77 | HR 49 | Temp 97.6°F | Resp 14 | Ht 71.0 in | Wt 280.0 lb

## 2015-01-31 DIAGNOSIS — I10 Essential (primary) hypertension: Secondary | ICD-10-CM | POA: Diagnosis not present

## 2015-01-31 DIAGNOSIS — I6529 Occlusion and stenosis of unspecified carotid artery: Secondary | ICD-10-CM

## 2015-01-31 DIAGNOSIS — I739 Peripheral vascular disease, unspecified: Secondary | ICD-10-CM | POA: Diagnosis present

## 2015-01-31 DIAGNOSIS — Z95828 Presence of other vascular implants and grafts: Secondary | ICD-10-CM | POA: Insufficient documentation

## 2015-01-31 DIAGNOSIS — I6523 Occlusion and stenosis of bilateral carotid arteries: Secondary | ICD-10-CM | POA: Diagnosis not present

## 2015-01-31 DIAGNOSIS — E119 Type 2 diabetes mellitus without complications: Secondary | ICD-10-CM | POA: Insufficient documentation

## 2015-01-31 DIAGNOSIS — Z9889 Other specified postprocedural states: Secondary | ICD-10-CM

## 2015-01-31 DIAGNOSIS — Z48812 Encounter for surgical aftercare following surgery on the circulatory system: Secondary | ICD-10-CM

## 2015-01-31 NOTE — Progress Notes (Signed)
     History of Present Illness  [name] is a 74 y.o. (Nov 08, 1940) male who presents for continued follow-up of his peripheral vascular disease and carotid disease. He is s/p right CEA and aorta-innominate graft on 09/09/11 and left CEA 04/08/11.   Previous carotid studies demonstrated: patent innominate bypass graft and patent carotid endartectomies without any significant restenosis. The patient continues to struggle with short term memory. He states he is unable to remember what day it is and names of people he's known for a long time. 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   His bilateral leg pain is stable. He does have issues with going up steps without a hand rail. He stays active by using a recumbent stair climber. He has been keeping busy with doing projects in his workshop.   Physical Examination  Filed Vitals:   01/31/15 1459 01/31/15 1501 01/31/15 1512 01/31/15 1513  BP: 154/57 145/73 147/69 148/77  Pulse: 53 53 52 49  Temp:  97.6 F (36.4 C)    TempSrc:  Oral    Resp:  14    Height:  5\' 11"  (1.803 m)    Weight:  280 lb (127.007 kg)    SpO2:  96%     Body mass index is 39.07 kg/(m^2).  General: A&O x 3, WD morbidly obese male in NAD  Pulmonary: Sym exp, good air movt, CTAB, no rales, rhonchi, & wheezing  Cardiac: RRR, Nl S1, S2, no Murmurs, rubs or gallops  Vascular: 1+ femoral pulses b/l, non palpable pedal pulses, but feet are warm without ischemic changes.   Gastrointestinal: soft, NTND, obese  Musculoskeletal: No muscle wasting or atrophy  Neurologic: CN 2-12 grossly intact.   Non-Invasive Vascular Imaging  CAROTID DUPLEX (Date: 01/31/2015):   R ICA stenosis: <40%  R VA:  patent and antegrade  L ICA stenosis: <40%  L VA:  patent and antegrade/atypical   ABIs  (Date: 01/31/15)  Right: 0.66 (previously 0.78); TBI 0.33  Left: 0.77 (previously 0.64); TBI 0.15  Medical Decision Making  [name] is a 74 y.o. male who  presents s/p bilateral carotid endartectomies and aorta-innominate bypass, peripheral vascular disease s/p aortobifemoral bypass grafting (2007).   -His carotid endartectomies and aorta-innominate bypass are patent without evidence of restenosis. Follow up in one year with carotid duplex.  -His ABIs have remained stable without progression of claudication symptoms. Encouraged him to remain active. He is on maximal medical management with aspirin, statin, and plavix. He is not a good surgical candidate if his symptoms worsen due to morbid obesity and prior aortobifemoral bypass. Follow up in one year with ABIs.   Virgina Jock, PA-C [facility] Office: [phone] Pager: [phone]  01/31/2015, 3:26 PM  This patient was seen in conjunction with Dr. Oneida Alar.   History and exam details as above. Peripheral arterial disease is stable. No limb threatening ischemia currently. Carotids are widely patent. Patient will follow-up in one year. He will continue aspirin daily.  Ruta Hinds, MD [facility] Office: [phone] Pager: [phone]

## 2015-01-31 NOTE — Progress Notes (Signed)
Filed Vitals:   01/31/15 1459 01/31/15 1501 01/31/15 1512 01/31/15 1513  BP: 154/57 145/73 147/69 148/77  Pulse: 53 53 52 49  Temp:  97.6 F (36.4 C)    TempSrc:  Oral    Resp:  14    Height:  5\' 11"  (1.803 m)    Weight:  280 lb (127.007 kg)    SpO2:  96%

## 2015-02-01 NOTE — Addendum Note (Signed)
Addended by: Thresa Ross C on: 02/01/2015 11:39 AM   Modules accepted: Orders

## 2015-02-25 ENCOUNTER — Other Ambulatory Visit: Payer: Self-pay

## 2015-02-27 ENCOUNTER — Ambulatory Visit (INDEPENDENT_AMBULATORY_CARE_PROVIDER_SITE_OTHER): Payer: Medicare Other | Admitting: Interventional Cardiology

## 2015-02-27 DIAGNOSIS — Z951 Presence of aortocoronary bypass graft: Secondary | ICD-10-CM

## 2015-02-27 DIAGNOSIS — N183 Chronic kidney disease, stage 3 (moderate): Secondary | ICD-10-CM

## 2015-02-27 DIAGNOSIS — I739 Peripheral vascular disease, unspecified: Secondary | ICD-10-CM

## 2015-02-27 DIAGNOSIS — I6529 Occlusion and stenosis of unspecified carotid artery: Secondary | ICD-10-CM

## 2015-02-27 DIAGNOSIS — I5032 Chronic diastolic (congestive) heart failure: Secondary | ICD-10-CM

## 2015-02-28 ENCOUNTER — Telehealth: Payer: Self-pay | Admitting: Interventional Cardiology

## 2015-02-28 NOTE — Telephone Encounter (Signed)
Returned pt [email or web address] 

## 2015-02-28 NOTE — Telephone Encounter (Signed)
Follow Up ° °Pt returned call//  °

## 2015-02-28 NOTE — Progress Notes (Signed)
Patient ID: [name], male   DOB: February 27, 1941, 74 y.o.   MRN: [id number] No show

## 2015-02-28 NOTE — Telephone Encounter (Signed)
Work in appt scheduled with [place] for 11/9 @ 8:30am. Pt voiced appreciation and verbalized understanding.

## 2015-02-28 NOTE — Telephone Encounter (Signed)
New problem    Pt need to speak to nurse because he didn't show up for his appt 10.5.16. I advised pt that Dr Tamala Julian didn't have any available appts and he stated that's crazy and he need an appt w/Dr Tamala Julian now. Please advise pt.

## 2015-04-03 ENCOUNTER — Ambulatory Visit (INDEPENDENT_AMBULATORY_CARE_PROVIDER_SITE_OTHER): Payer: Medicare Other | Admitting: Interventional Cardiology

## 2015-04-03 ENCOUNTER — Encounter: Payer: Self-pay | Admitting: Interventional Cardiology

## 2015-04-03 VITALS — BP 104/56 | HR 57 | Ht 71.0 in | Wt 272.8 lb

## 2015-04-03 DIAGNOSIS — I739 Peripheral vascular disease, unspecified: Secondary | ICD-10-CM | POA: Diagnosis not present

## 2015-04-03 DIAGNOSIS — Z951 Presence of aortocoronary bypass graft: Secondary | ICD-10-CM

## 2015-04-03 DIAGNOSIS — N183 Chronic kidney disease, stage 3 unspecified: Secondary | ICD-10-CM

## 2015-04-03 DIAGNOSIS — I5032 Chronic diastolic (congestive) heart failure: Secondary | ICD-10-CM | POA: Diagnosis not present

## 2015-04-03 DIAGNOSIS — I10 Essential (primary) hypertension: Secondary | ICD-10-CM | POA: Diagnosis not present

## 2015-04-03 DIAGNOSIS — E118 Type 2 diabetes mellitus with unspecified complications: Secondary | ICD-10-CM

## 2015-04-03 NOTE — Patient Instructions (Signed)
Medication Instructions:  Your physician recommends that you continue on your current medications as directed. Please refer to the Current Medication list given to you today.   Labwork: None orderd  Testing/Procedures: Your physician has requested that you have an echocardiogram. Echocardiography is a painless test that uses sound waves to create images of your heart. It provides your doctor with information about the size and shape of your heart and how well your heart's chambers and valves are working. This procedure takes approximately one hour. There are no restrictions for this procedure.   Follow-Up: Your physician wants you to follow-up in: 1 year with Dr.Smith You will receive a reminder letter in the mail two months in advance. If you don't receive a letter, please call our office to schedule the follow-up appointment.   Any Other Special Instructions Will Be Listed Below (If Applicable).     If you need a refill on your cardiac medications before your next appointment, please call your pharmacy.

## 2015-04-03 NOTE — Progress Notes (Signed)
Cardiology Office Note   Date:  04/03/2015   ID:  MICCAH COCKERELL, DOB [birth year], MRN [id number]  PCP:  Irven Shelling, MD  Cardiologist:  Sinclair Grooms, MD   Chief Complaint  Patient presents with  . Coronary Artery Disease      History of Present Illness: BERTIN CACCIATORE is a 74 y.o. male who presents for  CAD, chronic combined systolic and diastolic heart failure, bilateral carotid artery disease status post endarterectomy, peripheral arterial disease with claudication, hypertension, chronic kidney disease, and hyperlipidemia.   For the last 3 days he has had a neck continuous tightness in the sub-costal region starting in his lower back radiating around her rib cage. If he lies flat it goes away. Sitting or standing for long periods of time causes of discomfort. There is no associated dyspnea. It is not aggravated by walking. He has not noticed a rash or other complaint.    Past Medical History  Diagnosis Date  . Chest pain   . Muscle pain   . Diarrhea   . Hyperlipidemia     takes Tricor and Lipitor daily  . Hyperlipidemia   . Atherosclerotic heart disease   . PVD (peripheral vascular disease) ([place])   . Peripheral neuropathy ([place])   . Seasonal allergies     takes Mucinex and Zyrtec prn  . Nasal polyps     hx of  . Edema     right leg knee down swollen since fall 3 wks ago  . Back pain   . GERD (gastroesophageal reflux disease)     Rolaids as needed-occ reflux  . Colon polyps   . Renal insufficiency   . Urinary frequency     urgency/takes Vesicare daily  . Kidney stone     35+yrs ago  . Pneumonia     as a child  . Hypertension     takes Altace,Amlodipine,and Nadolol daily  . Peripheral edema     wears knee length hose-told by podiatrist to wear these  . Peripheral neuropathy (HCC)     takes Gabapentin daily  . Arthritis   . Bruises easily     takes Pletal daily and ASA 325mg  daily  . Hemorrhoids   . Cyst     on perineum;takes Doxycycline  daily  . History of kidney stones     at age 57   . Diabetes mellitus     takes Glipizide,Metformin,and Actos daily  . Cataract immature     right    Past Surgical History  Procedure Laterality Date  . Aortobifemoral bypass    . Reimplantation of inferior mesenteric artery    . Repair of infrarenal abdominal aortic aneurysm    . Shoulder arthroscopy w/ rotator cuff repair      right and left-open procedures  . Tonsillectomy      at age 75  . Knee arthroscopy  2011  . Cataract surgery      left  . Wisdom teeth extracted      as a teenager  . Carotid endarterectomy  04/08/2011    left CEA  . Endarterectomy  04/08/2011    Procedure: ENDARTERECTOMY CAROTID;  Surgeon: Elam Dutch, MD;  Location: [facility] OR;  Service: Vascular;  Laterality: Left;  with patch angioplasty  . Cardiac catheterization  most recent 05/2011    total of 4  . Colonoscopy    . Coronary artery bypass graft  09/09/2011    Procedure: CORONARY ARTERY BYPASS GRAFTING (CABG);  Surgeon: Gaye Pollack, MD;  Location: [facility];  Service: Open Heart Surgery;  Laterality: N/A;  Coronary artery bypass grafting  x three with Right saphenous vein harvested endoscopically and left internal mammary artery  . Endarterectomy  09/09/2011    Procedure: ENDARTERECTOMY CAROTID;  Surgeon: Elam Dutch, MD;  Location: [facility] OR;  Service: Vascular;  Laterality: Right;  with patch angioplasty  . Colonoscopy with propofol N/A 08/22/2013    Procedure: COLONOSCOPY WITH PROPOFOL;  Surgeon: Garlan Fair, MD;  Location: [facility] ENDOSCOPY;  Service: Endoscopy;  Laterality: N/A;  . Carotid angiogram N/A 06/05/2011    Procedure: CAROTID ANGIOGRAM;  Surgeon: Elam Dutch, MD;  Location: [facility] CATH LAB;  Service: Cardiovascular;  Laterality: N/A;  . Left heart catheterization with coronary angiogram N/A 07/09/2011    Procedure: LEFT HEART CATHETERIZATION WITH CORONARY ANGIOGRAM;  Surgeon: Sinclair Grooms, MD;  Location: [facility] CATH LAB;  Service:  Cardiovascular;  Laterality: N/A;  . Trigger finger release Right 06/28/2014    Procedure: RIGHT LONG TRIGGER RELEASE ;  Surgeon: Leanora Cover, MD;  Location: [facility];  Service: Orthopedics;  Laterality: Right;     Current Outpatient Prescriptions  Medication Sig Dispense Refill  . aspirin 325 MG EC tablet Take 325 mg by mouth every evening.     Marland Kitchen atorvastatin (LIPITOR) 20 MG tablet Take 20 mg by mouth every evening.     Marland Kitchen b complex vitamins capsule Take 1 capsule by mouth daily.      . Cetirizine HCl (ZYRTEC ALLERGY PO) Take 10 tablets by mouth daily as needed (allergies). For allergies    . cilostazol (PLETAL) 100 MG tablet Take 100 mg by mouth 2 (two) times daily.     . Cyanocobalamin (VITAMIN B 12 PO) Take 1 tablet by mouth every other day.    . erythromycin ophthalmic ointment USE A SMALL AMOUNT IN THE LEFT EYE TID AS DIRECTED  2  . fenofibrate (TRICOR) 145 MG tablet Take 145 mg by mouth every morning.    . furosemide (LASIX) 40 MG tablet Take 40 mg by mouth daily as needed for fluid or edema.    . gabapentin (NEURONTIN) 300 MG capsule Take two (2) capsules (600 mg total) by mouth every morning and take one (1) capsule (300 mg total) by mouth every evening. Can take and additional dose one (1) capsule (300 mg total) by mouth as needed for nerve pain.    Marland Kitchen glipiZIDE (GLUCOTROL) 10 MG tablet Take 10 mg by mouth every morning.     Marland Kitchen HYDROcodone-acetaminophen (NORCO/VICODIN) 5-325 MG tablet Take 1-2 tablets by mouth every 6 (six) hours as needed for moderate pain.    Marland Kitchen levothyroxine (SYNTHROID, LEVOTHROID) 50 MCG tablet Take 50 mcg by mouth daily before breakfast.    . Loperamide HCl (IMODIUM PO) Take 2 mg by mouth daily as needed. For diarrhea    . metFORMIN (GLUCOPHAGE-XR) 500 MG 24 hr tablet TAKE 2 TABLETS TWICE DAILY WITH MEALS.  3  . methylcellulose (ARTIFICIAL TEARS) 1 % ophthalmic solution Place 1 drop into both eyes 2 (two) times daily as needed (dry eyes).    .  metoprolol (LOPRESSOR) 50 MG tablet Take 50 mg by mouth 2 (two) times daily.    Marland Kitchen NITROSTAT 0.4 MG SL tablet DISSOLVE ONE TABLET UNDER TONGUE AS DIRECTED AS NEEDED FOR CHEST PAIN. 25 tablet 5  . pioglitazone (ACTOS) 15 MG tablet Take 15 mg by mouth daily.    . potassium chloride  SA (K-DUR,KLOR-CON) 20 MEQ tablet Take 20 mEq by mouth daily as needed (Take with lasix).    . pseudoephedrine-guaifenesin (MUCINEX D) 60-600 MG per tablet Take 1 tablet by mouth every 12 (twelve) hours as needed for congestion. Congestion    . ramipril (ALTACE) 10 MG capsule Take 10 mg by mouth daily.    . simethicone (MYLICON) 80 MG chewable tablet Chew 80-160 mg by mouth every 6 (six) hours as needed for flatulence.    . sodium chloride (OCEAN) 0.65 % SOLN nasal spray Place 1 spray into both nostrils 2 (two) times daily as needed for congestion.    . temazepam (RESTORIL) 15 MG capsule Take 15 mg by mouth at bedtime as needed. For sleep    . traMADol (ULTRAM) 50 MG tablet Take 50 mg by mouth every 4 (four) hours as needed for moderate pain.     No current facility-administered medications for this visit.    Allergies:   Amoxicillin-pot clavulanate    Social History:  The patient  reports that he quit smoking about 9 years ago. His smoking use included Cigarettes. He quit after 40 years of use. He has never used smokeless tobacco. He reports that he drinks alcohol. He reports that he does not use illicit drugs.   Family History:  The patient's family history includes Heart disease in his father and mother. There is no history of Anesthesia problems, Hypotension, Malignant hyperthermia, or Pseudochol deficiency.    ROS:  Please see the history of present illness.   Otherwise, review of systems are positive for  Claudication, diminished cognitive acuity, and dyspnea on exertion..   All other systems are reviewed and negative.    PHYSICAL EXAM: VS:  BP 104/56 mmHg  Pulse 57  Ht 5\' 11"  (1.803 m)  Wt 123.741 kg (272  lb 12.8 oz)  BMI 38.06 kg/m2 , BMI Body mass index is 38.06 kg/(m^2). GEN: Well nourished, well developed, in no acute distress HEENT: normal Neck: no JVD, carotid bruits, or masses Cardiac: RRR.  There  2/6 systolic  Right upper sternal border murmur, rub, or gallop. There is  No significant edema. Respiratory:  clear to auscultation bilaterally, normal work of breathing. GI: soft, nontender, nondistended, + BS MS: no deformity or atrophy Skin: warm and dry, no rash Neuro:  Strength and sensation are intact Psych: euthymic mood, full affect   EKG:  EKG is ordered today. The ekg reveals NSR, RBBB, and otherwise okay.   Recent Labs: 06/28/2014: BUN 42*; Creatinine, Ser 1.30; Hemoglobin 16.3; Potassium 4.6; Sodium 134*    Lipid Panel No results found for: CHOL, TRIG, HDL, CHOLHDL, VLDL, LDLCALC, LDLDIRECT    Wt Readings from Last 3 Encounters:  04/03/15 123.741 kg (272 lb 12.8 oz)  01/31/15 127.007 kg (280 lb)  07/26/14 130.182 kg (287 lb)      Other studies Reviewed: Additional studies/ records that were reviewed today include:  none. The findings include  none.    ASSESSMENT AND PLAN:  1. S/P CABG (coronary artery bypass graft)  stable without angina  2. Chronic diastolic heart failure (HCC)  No volume overload  3. PVD (peripheral vascular disease) ([place])  stable claudication  4. Essential hypertension  controlled  5. Chronic kidney disease, stage III (moderate)  Chronic kidney disease being followed by primary care  6. Type 2 diabetes mellitus with complication, unspecified long term insulin use status ([place])  Being followed by primary care    Current medicines are reviewed at length with the  patient today.  The patient has the following concerns regarding medicines:  none.  The following changes/actions have been instituted:     Continue current medical regimen.   Aerobic activity as tolerated to maintain exertional endurance.   Call if  angina.  Labs/ tests ordered today include:   Orders Placed This Encounter  Procedures  . EKG 12-Lead  . Echocardiogram     Disposition:   FU with HS in 1 year  Signed, Sinclair Grooms, MD  04/03/2015 [facility] [place], [place], [place]  [address] Phone: [phone]; Fax: [phone]

## 2015-04-11 ENCOUNTER — Other Ambulatory Visit: Payer: Self-pay

## 2015-04-11 ENCOUNTER — Ambulatory Visit (HOSPITAL_COMMUNITY): Payer: Medicare Other | Attending: Interventional Cardiology

## 2015-04-11 DIAGNOSIS — I129 Hypertensive chronic kidney disease with stage 1 through stage 4 chronic kidney disease, or unspecified chronic kidney disease: Secondary | ICD-10-CM | POA: Insufficient documentation

## 2015-04-11 DIAGNOSIS — I517 Cardiomegaly: Secondary | ICD-10-CM | POA: Insufficient documentation

## 2015-04-11 DIAGNOSIS — Z951 Presence of aortocoronary bypass graft: Secondary | ICD-10-CM | POA: Insufficient documentation

## 2015-04-11 DIAGNOSIS — N183 Chronic kidney disease, stage 3 (moderate): Secondary | ICD-10-CM | POA: Insufficient documentation

## 2015-04-11 DIAGNOSIS — I5032 Chronic diastolic (congestive) heart failure: Secondary | ICD-10-CM | POA: Diagnosis present

## 2015-04-11 DIAGNOSIS — E119 Type 2 diabetes mellitus without complications: Secondary | ICD-10-CM | POA: Insufficient documentation

## 2015-04-11 DIAGNOSIS — I739 Peripheral vascular disease, unspecified: Secondary | ICD-10-CM | POA: Diagnosis not present

## 2015-09-14 ENCOUNTER — Other Ambulatory Visit: Payer: Self-pay | Admitting: Interventional Cardiology

## 2015-09-16 ENCOUNTER — Other Ambulatory Visit: Payer: Self-pay | Admitting: Interventional Cardiology

## 2015-12-03 ENCOUNTER — Other Ambulatory Visit: Payer: Self-pay | Admitting: Orthopedic Surgery

## 2015-12-03 DIAGNOSIS — M25511 Pain in right shoulder: Secondary | ICD-10-CM

## 2015-12-19 ENCOUNTER — Ambulatory Visit
Admission: RE | Admit: 2015-12-19 | Discharge: 2015-12-19 | Disposition: A | Discharge status: Home / Self Care | Payer: Medicare Other | Source: Ambulatory Visit | Attending: Orthopedic Surgery | Admitting: Orthopedic Surgery

## 2015-12-19 DIAGNOSIS — M25511 Pain in right shoulder: Secondary | ICD-10-CM

## 2015-12-19 MED ORDER — IOPAMIDOL (ISOVUE-M 200) INJECTION 41%
15.0000 mL | Freq: Once | INTRAMUSCULAR | Status: AC
  Administered 2015-12-19: 15 mL via INTRA_ARTICULAR

## 2016-01-22 ENCOUNTER — Telehealth: Payer: Self-pay

## 2016-01-22 NOTE — Telephone Encounter (Signed)
Cardiac clearance placed in MR nurse fax box to be faxed to [facility]

## 2016-02-07 ENCOUNTER — Encounter: Payer: Self-pay | Admitting: Vascular Surgery

## 2016-02-13 ENCOUNTER — Ambulatory Visit (INDEPENDENT_AMBULATORY_CARE_PROVIDER_SITE_OTHER): Payer: Medicare Other | Admitting: Vascular Surgery

## 2016-02-13 ENCOUNTER — Ambulatory Visit (HOSPITAL_COMMUNITY)
Admission: RE | Admit: 2016-02-13 | Discharge: 2016-02-13 | Disposition: A | Discharge status: Home / Self Care | Payer: Medicare Other | Source: Ambulatory Visit | Attending: Vascular Surgery | Admitting: Vascular Surgery

## 2016-02-13 ENCOUNTER — Encounter: Payer: Self-pay | Admitting: Vascular Surgery

## 2016-02-13 ENCOUNTER — Ambulatory Visit (INDEPENDENT_AMBULATORY_CARE_PROVIDER_SITE_OTHER)
Admission: RE | Admit: 2016-02-13 | Discharge: 2016-02-13 | Disposition: A | Discharge status: Home / Self Care | Payer: Medicare Other | Source: Ambulatory Visit | Attending: Vascular Surgery | Admitting: Vascular Surgery

## 2016-02-13 VITALS — BP 146/61 | HR 65 | Ht 71.0 in | Wt 287.0 lb

## 2016-02-13 DIAGNOSIS — I739 Peripheral vascular disease, unspecified: Secondary | ICD-10-CM

## 2016-02-13 DIAGNOSIS — I6529 Occlusion and stenosis of unspecified carotid artery: Secondary | ICD-10-CM | POA: Insufficient documentation

## 2016-02-13 DIAGNOSIS — I6523 Occlusion and stenosis of bilateral carotid arteries: Secondary | ICD-10-CM | POA: Diagnosis not present

## 2016-02-13 LAB — VAS US CAROTID
Left CCA dist sys: -95 cm/s
Left CCA prox sys: 156 cm/s
Left ICA dist dias: -7 cm/s
Left ICA dist sys: -54 cm/s
Left ICA prox dias: -13 cm/s
Left ICA prox sys: -71 cm/s
Right CCA prox sys: 109 cm/s
Right cca dist sys: -71 cm/s

## 2016-02-13 NOTE — Progress Notes (Signed)
History of Present Illness:  Patient is a 75 y.o. year old male who presents continued follow-up of his peripheral vascular disease and carotid disease. He is s/p right CEA and aorta-innominate graft on 09/09/11 and left CEA 04/08/11. Over all he reports no significant changes in his sensation or ambulation in regards to his lower extremities.  He states he still feels like his feet are cold and he sleeps with a heating pad.  He has no history of ulcers or rest pain since his last visit.    He reports no changes in vision or weakness on one side of his body verses the other.  He does report tripping and falling multiple times without warning.     He has had no changes in his medications since his last visit.       Past Medical History:  Diagnosis Date  . Arthritis   . Atherosclerotic heart disease   . Back pain   . Bruises easily    takes Pletal daily and ASA 325mg  daily  . Cataract immature    right  . Chest pain   . Colon polyps   . Cyst    on perineum;takes Doxycycline daily  . Diabetes mellitus    takes Glipizide,Metformin,and Actos daily  . Diarrhea   . Edema    right leg knee down swollen since fall 3 wks ago  . GERD (gastroesophageal reflux disease)    Rolaids as needed-occ reflux  . Hemorrhoids   . History of kidney stones    at age 30   . Hyperlipidemia    takes Tricor and Lipitor daily  . Hyperlipidemia   . Hypertension    takes Altace,Amlodipine,and Nadolol daily  . Kidney stone    35+yrs ago  . Muscle pain   . Nasal polyps    hx of  . Peripheral edema    wears knee length hose-told by podiatrist to wear these  . Peripheral neuropathy ([place])   . Peripheral neuropathy (HCC)    takes Gabapentin daily  . Pneumonia    as a child  . PVD (peripheral vascular disease) ([place])   . Renal insufficiency   . Seasonal allergies    takes Mucinex and Zyrtec prn  . Urinary frequency    urgency/takes Vesicare daily    Past Surgical History:  Procedure Laterality  Date  . Aortobifemoral bypass    . CARDIAC CATHETERIZATION  most recent 05/2011   total of 4  . CAROTID ANGIOGRAM N/A 06/05/2011   Procedure: CAROTID ANGIOGRAM;  Surgeon: Elam Dutch, MD;  Location: [facility] CATH LAB;  Service: Cardiovascular;  Laterality: N/A;  . CAROTID ENDARTERECTOMY  04/08/2011   left CEA  . cataract surgery     left  . COLONOSCOPY    . COLONOSCOPY WITH PROPOFOL N/A 08/22/2013   Procedure: COLONOSCOPY WITH PROPOFOL;  Surgeon: Garlan Fair, MD;  Location: [facility] ENDOSCOPY;  Service: Endoscopy;  Laterality: N/A;  . CORONARY ARTERY BYPASS GRAFT  09/09/2011   Procedure: CORONARY ARTERY BYPASS GRAFTING (CABG);  Surgeon: Gaye Pollack, MD;  Location: [place];  Service: Open Heart Surgery;  Laterality: N/A;  Coronary artery bypass grafting  x three with Right saphenous vein harvested endoscopically and left internal mammary artery  . ENDARTERECTOMY  04/08/2011   Procedure: ENDARTERECTOMY CAROTID;  Surgeon: Elam Dutch, MD;  Location: [facility] OR;  Service: Vascular;  Laterality: Left;  with patch angioplasty  . ENDARTERECTOMY  09/09/2011   Procedure: ENDARTERECTOMY CAROTID;  Surgeon:  Elam Dutch, MD;  Location: [facility] OR;  Service: Vascular;  Laterality: Right;  with patch angioplasty  . KNEE ARTHROSCOPY  2011  . LEFT HEART CATHETERIZATION WITH CORONARY ANGIOGRAM N/A 07/09/2011   Procedure: LEFT HEART CATHETERIZATION WITH CORONARY ANGIOGRAM;  Surgeon: Sinclair Grooms, MD;  Location: [facility] CATH LAB;  Service: Cardiovascular;  Laterality: N/A;  . Reimplantation of inferior mesenteric artery    . Repair of infrarenal abdominal aortic aneurysm    . SHOULDER ARTHROSCOPY W/ ROTATOR CUFF REPAIR     right and left-open procedures  . TONSILLECTOMY     at age 7  . TRIGGER FINGER RELEASE Right 06/28/2014   Procedure: RIGHT LONG TRIGGER RELEASE ;  Surgeon: Leanora Cover, MD;  Location: [place];  Service: Orthopedics;  Laterality: Right;  . wisdom teeth extracted     as a  teenager     Social History Social History  Substance Use Topics  . Smoking status: Former Smoker    Years: 40.00    Types: Cigarettes    Quit date: 10/27/2005  . Smokeless tobacco: Never Used  . Alcohol use 0.0 oz/week    2 - 3 Cans of beer per week     Comment: occasional- weekly    Family History Family History  Problem Relation Age of Onset  . Heart disease Mother   . Heart disease Father   . Anesthesia problems Neg Hx   . Hypotension Neg Hx   . Malignant hyperthermia Neg Hx   . Pseudochol deficiency Neg Hx     Allergies  Allergies  Allergen Reactions  . Amoxicillin-Pot Clavulanate Nausea Only    Augmentin      Current Outpatient Prescriptions  Medication Sig Dispense Refill  . aspirin 325 MG EC tablet Take 325 mg by mouth every evening.     Marland Kitchen atorvastatin (LIPITOR) 20 MG tablet Take 20 mg by mouth every evening.     Marland Kitchen b complex vitamins capsule Take 1 capsule by mouth daily.      . Cetirizine HCl (ZYRTEC ALLERGY PO) Take 10 tablets by mouth daily as needed (allergies). For allergies    . cilostazol (PLETAL) 100 MG tablet Take 100 mg by mouth 2 (two) times daily.     . Cyanocobalamin (VITAMIN B 12 PO) Take 1 tablet by mouth every other day.    . erythromycin ophthalmic ointment USE A SMALL AMOUNT IN THE LEFT EYE TID AS DIRECTED  2  . fenofibrate (TRICOR) 145 MG tablet Take 145 mg by mouth every morning.    . furosemide (LASIX) 40 MG tablet Take 40 mg by mouth daily as needed for fluid or edema.    . gabapentin (NEURONTIN) 300 MG capsule Take two (2) capsules (600 mg total) by mouth every morning and take one (1) capsule (300 mg total) by mouth every evening. Can take and additional dose one (1) capsule (300 mg total) by mouth as needed for nerve pain.    Marland Kitchen glipiZIDE (GLUCOTROL) 10 MG tablet Take 10 mg by mouth every morning.     Marland Kitchen HYDROcodone-acetaminophen (NORCO/VICODIN) 5-325 MG tablet Take 1-2 tablets by mouth every 6 (six) hours as needed for moderate pain.      Marland Kitchen JANUVIA 100 MG tablet TK 1 T PO QD  2  . levothyroxine (SYNTHROID, LEVOTHROID) 50 MCG tablet Take 50 mcg by mouth daily before breakfast.    . Loperamide HCl (IMODIUM PO) Take 2 mg by mouth daily as needed. For diarrhea    .  methylcellulose (ARTIFICIAL TEARS) 1 % ophthalmic solution Place 1 drop into both eyes 2 (two) times daily as needed (dry eyes).    . metoprolol (LOPRESSOR) 50 MG tablet Take 50 mg by mouth 2 (two) times daily.    . nitroGLYCERIN (NITROSTAT) 0.4 MG SL tablet PLACE 1 TABLET UNDER TONGUE EVERY 5 MINUTES AS NEEDED FOR CHEST PAIN. 75 tablet 1  . pioglitazone (ACTOS) 15 MG tablet Take 15 mg by mouth daily.    . potassium chloride SA (K-DUR,KLOR-CON) 20 MEQ tablet Take 20 mEq by mouth daily as needed (Take with lasix).    . pseudoephedrine-guaifenesin (MUCINEX D) 60-600 MG per tablet Take 1 tablet by mouth every 12 (twelve) hours as needed for congestion. Congestion    . ramipril (ALTACE) 10 MG capsule Take 10 mg by mouth daily.    . simethicone (MYLICON) 80 MG chewable tablet Chew 80-160 mg by mouth every 6 (six) hours as needed for flatulence.    . sodium chloride (OCEAN) 0.65 % SOLN nasal spray Place 1 spray into both nostrils 2 (two) times daily as needed for congestion.    . temazepam (RESTORIL) 15 MG capsule Take 15 mg by mouth at bedtime as needed. For sleep    . traMADol (ULTRAM) 50 MG tablet Take 50 mg by mouth every 4 (four) hours as needed for moderate pain.    . metFORMIN (GLUCOPHAGE-XR) 500 MG 24 hr tablet TAKE 2 TABLETS TWICE DAILY WITH MEALS.  3   No current facility-administered medications for this visit.     ROS:   General:  No weight loss, Fever, chills  HEENT: No recent headaches, no nasal bleeding, no visual changes, no sore throat  Neurologic: No dizziness, blackouts, seizures. No recent symptoms of stroke or mini- stroke. No recent episodes of slurred speech, or temporary blindness.  Cardiac: No recent episodes of chest pain/pressure, no shortness  of breath at rest.  No shortness of breath with exertion.  Denies history of atrial fibrillation or irregular heartbeat  Vascular: No history of rest pain in feet.  No history of claudication.  No history of non-healing ulcer, No history of DVT   Pulmonary: No home oxygen, no productive cough, no hemoptysis,  No asthma or wheezing  Musculoskeletal:  [x ] Arthritis, [ ]  Low back pain,  [x ] Joint pain  Hematologic:No history of hypercoagulable state.  No history of easy bleeding.  No history of anemia  Gastrointestinal: No hematochezia or melena,  No gastroesophageal reflux, no trouble swallowing  Urinary: [ ]  chronic Kidney disease, [ ]  on HD - [ ]  MWF or [ ]  TTHS, [ ]  Burning with urination, [ ]  Frequent urination, [ ]  Difficulty urinating;   Skin: No rashes  Psychological: No history of anxiety,  No history of depression   Physical Examination  Vitals:   02/13/16 1450 02/13/16 1452  BP: (!) 145/63 (!) 146/61  Pulse: 65   SpO2: 93%   Weight: 287 lb (130.2 kg)   Height: 5\' 11"  (1.803 m)     Body mass index is 40.03 kg/m.  General:  Alert and oriented, no acute distress HEENT: Normal Neck: No bruit or JVD Pulmonary: Clear to auscultation bilaterally Cardiac: Regular Rate and Rhythm without murmur Gastrointestinal: Soft, non-tender, non-distended, no mass, no scars Skin: No rash Extremity Pulses:  2+ radial, brachial pulses bilaterally,, right popliteal palpable pulse, no palpable distal pulses. Musculoskeletal: No deformity or edema  Neurologic: Upper and lower extremity motor 5/5 and symmetric  DATA:  ABIs  (  Date: 01/31/15)  Right: 0.66 (previously 0.78); TBI 0.33  Left: 0.77 (previously 0.64); TBI 0.15  ABI's 02/13/2016 Right 0.61 monophasic flow Left 0.27 monophasic flow  Carotid duplex With increased velocity 292 cm/son the left proximal subclavian artery Patent aorta-innominate artery by pass  No significant ICA stenosis   ASSESSMENT:  [name]  is a 75 y.o. male who presents s/p bilateral carotid endartectomies and aorta-innominate bypass, peripheral vascular disease s/p aortobifemoral bypass grafting (2007).     PLAN: His carotid endartectomies and aorta-innominate bypass are patent without evidence of restenosis. Follow up in one year with carotid duplex.   Changes in his left LE ABI without symptoms of claudication or rest pain.  He will follow up in 1 year for repeat ABI's.  Encouraged him to remain active. He will ask his Orthopedic physician for a referral to PT for increased nobility.  He is on maximal medical management with aspirin, statin, and plavix. He is not a good surgical candidate if his symptoms worsen due to morbid obesity and prior aortobifemoral bypass.  Theda Sers, EMMA MAUREEN PA-C [facility]  The patient was seen in conjunction with Dr. Oneida Alar today  History and exam details as above. Patient is doing well from his carotid occlusive disease. He has had no significant recurrence. He is left subclavian does have some mild narrowing and there is a 20 point discrepancy so his blood pressure should be measured in the right arm only. His we'll be a more accurate measurement of his blood pressure. He has evidence of lower extremity arterial occlusive disease bilaterally but currently this is minimally symptomatic. The patient has had several falls and some clumsiness of his lower extremities. However believe this is probably multifactorial not all related to his arterial occlusive disease. He does not describe claudication rest pain or any nonhealing wounds of his feet. As mentioned above he is not a very good operative candidate due to his previous aortobifem in his severe morbid obesity. He will follow-up in one year.  Ruta Hinds, MD [facility] [place] Office: [phone] Pager: [phone]

## 2016-04-20 ENCOUNTER — Ambulatory Visit (INDEPENDENT_AMBULATORY_CARE_PROVIDER_SITE_OTHER): Payer: Medicare Other | Admitting: Interventional Cardiology

## 2016-04-20 ENCOUNTER — Encounter: Payer: Self-pay | Admitting: Interventional Cardiology

## 2016-04-20 VITALS — BP 154/62 | HR 90 | Ht 70.5 in | Wt 283.8 lb

## 2016-04-20 DIAGNOSIS — Z951 Presence of aortocoronary bypass graft: Secondary | ICD-10-CM | POA: Diagnosis not present

## 2016-04-20 DIAGNOSIS — I739 Peripheral vascular disease, unspecified: Secondary | ICD-10-CM

## 2016-04-20 DIAGNOSIS — I10 Essential (primary) hypertension: Secondary | ICD-10-CM

## 2016-04-20 DIAGNOSIS — N183 Chronic kidney disease, stage 3 unspecified: Secondary | ICD-10-CM

## 2016-04-20 DIAGNOSIS — I5032 Chronic diastolic (congestive) heart failure: Secondary | ICD-10-CM

## 2016-04-20 DIAGNOSIS — E118 Type 2 diabetes mellitus with unspecified complications: Secondary | ICD-10-CM

## 2016-04-20 DIAGNOSIS — Z794 Long term (current) use of insulin: Secondary | ICD-10-CM

## 2016-04-20 NOTE — Progress Notes (Signed)
Cardiology Office Note    Date:  04/20/2016   ID:  [name], DOB [birth year], MRN [id number]  PCP:  Irven Shelling, MD  Cardiologist: Sinclair Grooms, MD   Chief Complaint  Patient presents with  . Coronary Artery Disease    History of Present Illness:  [name] is a 75 y.o. male who presents for CAD, chronic combined systolic and diastolic heart failure, bilateral carotid artery disease status post endarterectomy, peripheral arterial disease with claudication, hypertension, chronic kidney disease, and hyperlipidemia.   Legs get weak with ambulation. He denies chest pain. No significant palpitations. He denies orthopnea, PND, and prolonged angina. Nitroglycerin use is rare.  Past Medical History:  Diagnosis Date  . Arthritis   . Atherosclerotic heart disease   . Back pain   . Bruises easily    takes Pletal daily and ASA 325mg  daily  . Cataract immature    right  . Chest pain   . Colon polyps   . Cyst    on perineum;takes Doxycycline daily  . Diabetes mellitus    takes Glipizide,Metformin,and Actos daily  . Diarrhea   . Edema    right leg knee down swollen since fall 3 wks ago  . GERD (gastroesophageal reflux disease)    Rolaids as needed-occ reflux  . Hemorrhoids   . History of kidney stones    at age 58   . Hyperlipidemia    takes Tricor and Lipitor daily  . Hyperlipidemia   . Hypertension    takes Altace,Amlodipine,and Nadolol daily  . Kidney stone    35+yrs ago  . Muscle pain   . Nasal polyps    hx of  . Peripheral edema    wears knee length hose-told by podiatrist to wear these  . Peripheral neuropathy ([place])   . Peripheral neuropathy (HCC)    takes Gabapentin daily  . Pneumonia    as a child  . PVD (peripheral vascular disease) ([place])   . Renal insufficiency   . Seasonal allergies    takes Mucinex and Zyrtec prn  . Urinary frequency    urgency/takes Vesicare daily    Past Surgical History:  Procedure Laterality Date  .  Aortobifemoral bypass    . CARDIAC CATHETERIZATION  most recent 05/2011   total of 4  . CAROTID ANGIOGRAM N/A 06/05/2011   Procedure: CAROTID ANGIOGRAM;  Surgeon: Elam Dutch, MD;  Location: [facility] CATH LAB;  Service: Cardiovascular;  Laterality: N/A;  . CAROTID ENDARTERECTOMY  04/08/2011   left CEA  . cataract surgery     left  . COLONOSCOPY    . COLONOSCOPY WITH PROPOFOL N/A 08/22/2013   Procedure: COLONOSCOPY WITH PROPOFOL;  Surgeon: Garlan Fair, MD;  Location: [facility] ENDOSCOPY;  Service: Endoscopy;  Laterality: N/A;  . CORONARY ARTERY BYPASS GRAFT  09/09/2011   Procedure: CORONARY ARTERY BYPASS GRAFTING (CABG);  Surgeon: Gaye Pollack, MD;  Location: [place];  Service: Open Heart Surgery;  Laterality: N/A;  Coronary artery bypass grafting  x three with Right saphenous vein harvested endoscopically and left internal mammary artery  . ENDARTERECTOMY  04/08/2011   Procedure: ENDARTERECTOMY CAROTID;  Surgeon: Elam Dutch, MD;  Location: [facility] OR;  Service: Vascular;  Laterality: Left;  with patch angioplasty  . ENDARTERECTOMY  09/09/2011   Procedure: ENDARTERECTOMY CAROTID;  Surgeon: Elam Dutch, MD;  Location: [facility] OR;  Service: Vascular;  Laterality: Right;  with patch angioplasty  . KNEE ARTHROSCOPY  2011  . LEFT  HEART CATHETERIZATION WITH CORONARY ANGIOGRAM N/A 07/09/2011   Procedure: LEFT HEART CATHETERIZATION WITH CORONARY ANGIOGRAM;  Surgeon: Sinclair Grooms, MD;  Location: [facility] CATH LAB;  Service: Cardiovascular;  Laterality: N/A;  . Reimplantation of inferior mesenteric artery    . Repair of infrarenal abdominal aortic aneurysm    . SHOULDER ARTHROSCOPY W/ ROTATOR CUFF REPAIR     right and left-open procedures  . TONSILLECTOMY     at age 39  . TRIGGER FINGER RELEASE Right 06/28/2014   Procedure: RIGHT LONG TRIGGER RELEASE ;  Surgeon: Leanora Cover, MD;  Location: [place];  Service: Orthopedics;  Laterality: Right;  . wisdom teeth extracted     as a teenager     Current Medications: Outpatient Medications Prior to Visit  Medication Sig Dispense Refill  . aspirin 325 MG EC tablet Take 325 mg by mouth every evening.     Marland Kitchen atorvastatin (LIPITOR) 20 MG tablet Take 20 mg by mouth every evening.     Marland Kitchen b complex vitamins capsule Take 1 capsule by mouth daily.      . Cetirizine HCl (ZYRTEC ALLERGY PO) Take 10 tablets by mouth daily as needed (allergies). For allergies    . cilostazol (PLETAL) 100 MG tablet Take 100 mg by mouth 2 (two) times daily.     . Cyanocobalamin (VITAMIN B 12 PO) Take 1 tablet by mouth every other day.    . erythromycin ophthalmic ointment USE A SMALL AMOUNT IN THE LEFT EYE TID AS DIRECTED  2  . fenofibrate (TRICOR) 145 MG tablet Take 145 mg by mouth every morning.    . furosemide (LASIX) 40 MG tablet Take 40 mg by mouth daily as needed for fluid or edema.    . gabapentin (NEURONTIN) 300 MG capsule Take two (2) capsules (600 mg total) by mouth every morning and take one (1) capsule (300 mg total) by mouth every evening. Can take and additional dose one (1) capsule (300 mg total) by mouth as needed for nerve pain.    Marland Kitchen glipiZIDE (GLUCOTROL) 10 MG tablet Take 10 mg by mouth every morning.     Marland Kitchen HYDROcodone-acetaminophen (NORCO/VICODIN) 5-325 MG tablet Take 1-2 tablets by mouth every 6 (six) hours as needed for moderate pain.    Marland Kitchen JANUVIA 100 MG tablet TK 1 T PO QD  2  . levothyroxine (SYNTHROID, LEVOTHROID) 50 MCG tablet Take 50 mcg by mouth daily before breakfast.    . Loperamide HCl (IMODIUM PO) Take 2 mg by mouth daily as needed. For diarrhea    . methylcellulose (ARTIFICIAL TEARS) 1 % ophthalmic solution Place 1 drop into both eyes 2 (two) times daily as needed (dry eyes).    . metoprolol (LOPRESSOR) 50 MG tablet Take 50 mg by mouth 2 (two) times daily.    . nitroGLYCERIN (NITROSTAT) 0.4 MG SL tablet PLACE 1 TABLET UNDER TONGUE EVERY 5 MINUTES AS NEEDED FOR CHEST PAIN. 75 tablet 1  . pioglitazone (ACTOS) 15 MG tablet Take 15 mg by  mouth daily.    . potassium chloride SA (K-DUR,KLOR-CON) 20 MEQ tablet Take 20 mEq by mouth daily as needed (Take with lasix).    . pseudoephedrine-guaifenesin (MUCINEX D) 60-600 MG per tablet Take 1 tablet by mouth every 12 (twelve) hours as needed for congestion. Congestion    . ramipril (ALTACE) 10 MG capsule Take 10 mg by mouth daily.    . simethicone (MYLICON) 80 MG chewable tablet Chew 80-160 mg by mouth every 6 (six)  hours as needed for flatulence.    . sodium chloride (OCEAN) 0.65 % SOLN nasal spray Place 1 spray into both nostrils 2 (two) times daily as needed for congestion.    . temazepam (RESTORIL) 15 MG capsule Take 15 mg by mouth at bedtime as needed. For sleep    . traMADol (ULTRAM) 50 MG tablet Take 50 mg by mouth every 4 (four) hours as needed for moderate pain.    . metFORMIN (GLUCOPHAGE-XR) 500 MG 24 hr tablet TAKE 2 TABLETS TWICE DAILY WITH MEALS.  3   No facility-administered medications prior to visit.      Allergies:   Amoxicillin-pot clavulanate   Social History   Social History  . Marital status: Married    Spouse name: N/A  . Number of children: N/A  . Years of education: N/A   Social History Main Topics  . Smoking status: Former Smoker    Years: 40.00    Types: Cigarettes    Quit date: 10/27/2005  . Smokeless tobacco: Never Used  . Alcohol use 0.0 oz/week    2 - 3 Cans of beer per week     Comment: occasional- weekly  . Drug use: No  . Sexual activity: No   Other Topics Concern  . None   Social History Narrative  . None     Family History:  The patient's family history includes Heart disease in his father and mother.   ROS:   Please see the history of present illness.    Cough, leg swelling right greater than left, fleeting chest pain, diarrhea, muscle pain, dizziness, easy bruising, leg pain, and excessive fatigue. He seems to be somewhat forgetful. All other systems reviewed and are negative.   PHYSICAL EXAM:   VS:  BP (!) 154/62 (BP  Location: Left Arm)   Pulse 90   Ht 5' 10.5" (1.791 m)   Wt 283 lb 12.8 oz (128.7 kg)   BMI 40.15 kg/m    GEN: Well nourished, well developed, in no acute distress. Morbid obesity.  HEENT: normal  Neck: no JVD, carotid bruits, or masses Cardiac: RRR; no murmurs, rubs, or gallops,no edema  Respiratory:  clear to auscultation bilaterally, normal work of breathing GI: soft, nontender, nondistended, + BS MS: no deformity or atrophy  Skin: warm and dry, no rash Neuro:  Alert and Oriented x 3, Strength and sensation are intact Psych: euthymic mood, full affect  Wt Readings from Last 3 Encounters:  04/20/16 283 lb 12.8 oz (128.7 kg)  02/13/16 287 lb (130.2 kg)  04/03/15 272 lb 12.8 oz (123.7 kg)      Studies/Labs Reviewed:   EKG:  EKG  Right bundle, left anterior hemiblock, normal sinus rhythm.  Recent Labs: No results found for requested labs within last 8760 hours.   Lipid Panel No results found for: CHOL, TRIG, HDL, CHOLHDL, VLDL, LDLCALC, LDLDIRECT  Additional studies/ records that were reviewed today include:  Reviewed lower extremity Doppler study. He has moderate to severe bilateral lower extremity PVD based upon study done in September by Dr. Oneida Alar    ASSESSMENT:    1. Chronic diastolic heart failure ([place])   2. Essential hypertension   3. S/P CABG (coronary artery bypass graft)   4. Peripheral vascular disease ([place])   5. Chronic kidney disease, stage III (moderate)   6. Type 2 diabetes mellitus with complication, with long-term current use of insulin (HCC)      PLAN:  In order of problems listed above:  1.  No evidence of volume overload. 2 g sodium diet. 2. Adequate control. 2 g sodium diet. 3. Denies angina. Nitroglycerin prescriptions refilled. 4. He discuss lower extremity weakness and pain with ambulation. Also some difficulty with balance. This is being by Dr. Oneida Alar at the [facility].    Medication Adjustments/Labs and Tests Ordered: Current medicines are  reviewed at length with the patient today.  Concerns regarding medicines are outlined above.  Medication changes, Labs and Tests ordered today are listed in the Patient Instructions below. Patient Instructions  Medication Instructions:  None  Labwork: None  Testing/Procedures: None  Follow-Up: Your physician wants you to follow-up in: 1 year with Dr. Tamala Julian.  You will receive a reminder letter in the mail two months in advance. If you don't receive a letter, please call our office to schedule the follow-up appointment.   Any Other Special Instructions Will Be Listed Below (If Applicable).     If you need a refill on your cardiac medications before your next appointment, please call your pharmacy.      Signed, Sinclair Grooms, MD  04/20/2016 3:17 PM    [facility] [address], [place], [place]  [address] Phone: [phone]; Fax: [phone]

## 2016-04-20 NOTE — Patient Instructions (Signed)

## 2016-04-27 NOTE — Addendum Note (Signed)
Addended by: Lianne Cure A on: 04/27/2016 02:32 PM   Modules accepted: Orders

## 2016-07-23 DIAGNOSIS — E119 Type 2 diabetes mellitus without complications: Secondary | ICD-10-CM | POA: Diagnosis not present

## 2016-07-23 DIAGNOSIS — H26493 Other secondary cataract, bilateral: Secondary | ICD-10-CM | POA: Diagnosis not present

## 2016-07-23 DIAGNOSIS — H52202 Unspecified astigmatism, left eye: Secondary | ICD-10-CM | POA: Diagnosis not present

## 2016-07-23 DIAGNOSIS — H04123 Dry eye syndrome of bilateral lacrimal glands: Secondary | ICD-10-CM | POA: Diagnosis not present

## 2016-09-17 DIAGNOSIS — E1142 Type 2 diabetes mellitus with diabetic polyneuropathy: Secondary | ICD-10-CM | POA: Diagnosis not present

## 2016-09-17 DIAGNOSIS — Z79899 Other long term (current) drug therapy: Secondary | ICD-10-CM | POA: Diagnosis not present

## 2016-09-17 DIAGNOSIS — I10 Essential (primary) hypertension: Secondary | ICD-10-CM | POA: Diagnosis not present

## 2016-09-17 DIAGNOSIS — E039 Hypothyroidism, unspecified: Secondary | ICD-10-CM | POA: Diagnosis not present

## 2016-09-17 DIAGNOSIS — E1151 Type 2 diabetes mellitus with diabetic peripheral angiopathy without gangrene: Secondary | ICD-10-CM | POA: Diagnosis not present

## 2016-09-17 DIAGNOSIS — R269 Unspecified abnormalities of gait and mobility: Secondary | ICD-10-CM | POA: Diagnosis not present

## 2016-09-17 DIAGNOSIS — G3184 Mild cognitive impairment, so stated: Secondary | ICD-10-CM | POA: Diagnosis not present

## 2016-09-17 DIAGNOSIS — Z7984 Long term (current) use of oral hypoglycemic drugs: Secondary | ICD-10-CM | POA: Diagnosis not present

## 2016-09-17 DIAGNOSIS — E1165 Type 2 diabetes mellitus with hyperglycemia: Secondary | ICD-10-CM | POA: Diagnosis not present

## 2016-09-21 DIAGNOSIS — H04122 Dry eye syndrome of left lacrimal gland: Secondary | ICD-10-CM | POA: Diagnosis not present

## 2016-09-21 DIAGNOSIS — H5712 Ocular pain, left eye: Secondary | ICD-10-CM | POA: Diagnosis not present

## 2016-09-24 DIAGNOSIS — N179 Acute kidney failure, unspecified: Secondary | ICD-10-CM | POA: Diagnosis not present

## 2016-09-24 DIAGNOSIS — N183 Chronic kidney disease, stage 3 (moderate): Secondary | ICD-10-CM | POA: Diagnosis not present

## 2016-10-01 DIAGNOSIS — N183 Chronic kidney disease, stage 3 (moderate): Secondary | ICD-10-CM | POA: Diagnosis not present

## 2016-10-01 DIAGNOSIS — N179 Acute kidney failure, unspecified: Secondary | ICD-10-CM | POA: Diagnosis not present

## 2016-10-02 ENCOUNTER — Ambulatory Visit: Payer: Medicare HMO | Attending: Internal Medicine | Admitting: Physical Therapy

## 2016-10-02 DIAGNOSIS — M6281 Muscle weakness (generalized): Secondary | ICD-10-CM | POA: Insufficient documentation

## 2016-10-02 DIAGNOSIS — R2681 Unsteadiness on feet: Secondary | ICD-10-CM

## 2016-10-02 DIAGNOSIS — R2689 Other abnormalities of gait and mobility: Secondary | ICD-10-CM | POA: Insufficient documentation

## 2016-10-02 NOTE — Therapy (Signed)
[facility] [address] [place], [place], [address] Phone: [phone]   Fax:  [phone]  Physical Therapy Evaluation  Patient Details  Name: [name] MRN: [id number] Date of Birth: August 12, 1940 Referring Provider: Dr. Lavone Orn  Encounter Date: 10/02/2016      PT End of Session - 10/02/16 2035    Visit Number 1   Number of Visits 17   Date for PT Re-Evaluation 12/01/16   Authorization Type [employer] Medicare/[employer] Medicare HMO-GCODE every 10th visit   PT Start Time 1021   PT Stop Time 1111   PT Time Calculation (min) 50 min   Equipment Utilized During Treatment Gait belt   Activity Tolerance Patient tolerated treatment well   Behavior During Therapy [facility] for tasks assessed/performed      Past Medical History:  Diagnosis Date  . Arthritis   . Atherosclerotic heart disease   . Back pain   . Bruises easily    takes Pletal daily and ASA 325mg  daily  . Cataract immature    right  . Chest pain   . Colon polyps   . Cyst    on perineum;takes Doxycycline daily  . Diabetes mellitus    takes Glipizide,Metformin,and Actos daily  . Diarrhea   . Edema    right leg knee down swollen since fall 3 wks ago  . GERD (gastroesophageal reflux disease)    Rolaids as needed-occ reflux  . Hemorrhoids   . History of kidney stones    at age 51   . Hyperlipidemia    takes Tricor and Lipitor daily  . Hyperlipidemia   . Hypertension    takes Altace,Amlodipine,and Nadolol daily  . Kidney stone    35+yrs ago  . Muscle pain   . Nasal polyps    hx of  . Peripheral edema    wears knee length hose-told by podiatrist to wear these  . Peripheral neuropathy ([place])   . Peripheral neuropathy (HCC)    takes Gabapentin daily  . Pneumonia    as a child  . PVD (peripheral vascular disease) ([place])   . Renal insufficiency   . Seasonal allergies    takes Mucinex and Zyrtec prn  . Urinary frequency    urgency/takes Vesicare  daily    Past Surgical History:  Procedure Laterality Date  . Aortobifemoral bypass    . CARDIAC CATHETERIZATION  most recent 05/2011   total of 4  . CAROTID ANGIOGRAM N/A 06/05/2011   Procedure: CAROTID ANGIOGRAM;  Surgeon: Elam Dutch, MD;  Location: [facility] CATH LAB;  Service: Cardiovascular;  Laterality: N/A;  . CAROTID ENDARTERECTOMY  04/08/2011   left CEA  . cataract surgery     left  . COLONOSCOPY    . COLONOSCOPY WITH PROPOFOL N/A 08/22/2013   Procedure: COLONOSCOPY WITH PROPOFOL;  Surgeon: Garlan Fair, MD;  Location: [facility] ENDOSCOPY;  Service: Endoscopy;  Laterality: N/A;  . CORONARY ARTERY BYPASS GRAFT  09/09/2011   Procedure: CORONARY ARTERY BYPASS GRAFTING (CABG);  Surgeon: Gaye Pollack, MD;  Location: [place];  Service: Open Heart Surgery;  Laterality: N/A;  Coronary artery bypass grafting  x three with Right saphenous vein harvested endoscopically and left internal mammary artery  . ENDARTERECTOMY  04/08/2011   Procedure: ENDARTERECTOMY CAROTID;  Surgeon: Elam Dutch, MD;  Location[facility] OR;  Service: Vascular;  Laterality: Left;  with patch angioplasty  . ENDARTERECTOMY  09/09/2011   Procedure: ENDARTERECTOMY CAROTID;  Surgeon: Elam Dutch, MD;  Location: [facility] OR;  Service: Vascular;  Laterality: Right;  with patch angioplasty  . KNEE ARTHROSCOPY  2011  . LEFT HEART CATHETERIZATION WITH CORONARY ANGIOGRAM N/A 07/09/2011   Procedure: LEFT HEART CATHETERIZATION WITH CORONARY ANGIOGRAM;  Surgeon: Sinclair Grooms, MD;  Location: [facility] CATH LAB;  Service: Cardiovascular;  Laterality: N/A;  . Reimplantation of inferior mesenteric artery    . Repair of infrarenal abdominal aortic aneurysm    . SHOULDER ARTHROSCOPY W/ ROTATOR CUFF REPAIR     right and left-open procedures  . TONSILLECTOMY     at age 27  . TRIGGER FINGER RELEASE Right 06/28/2014   Procedure: RIGHT LONG TRIGGER RELEASE ;  Surgeon: Leanora Cover, MD;  Location: [facility];  Service: Orthopedics;   Laterality: Right;  . wisdom teeth extracted     as a teenager    There were no vitals filed for this visit.       Subjective Assessment - 10/02/16 1025    Subjective Pt reports falling "a lot".  Has been falling over the past two years.  Feels that most falls occur due to tripping his foot on the ground and not being able to recover.  He feels this is due to his diabetes, not balance.  Last fall was due to turning on his deck and not being able to recover.  On that last fall, he fell on his tailbone-painful, but getting better.  He uses a cane for gait-has used for 1-2 years.    Pertinent History arthritis, DM, HTN, peripheral neuropathy, PVD, CABG 2013, R RTC repair   Patient Stated Goals Pt's goal for therapy is to be able to walk wiithout cane, not to have to be so careful about walking.   Currently in Pain? Yes   Pain Score --  8/10 at worst-pt doesn't rate today,reports getting   Pain Location Sacrum   Pain Descriptors / Indicators Sore   Pain Type Acute pain   Pain Onset 1 to 4 weeks ago   Aggravating Factors  trying to move, sit down   Pain Relieving Factors changing positions     PT will monitor pain, but will not address at this time due to beyond scope of this evaluation.       [facility] PT Assessment - 10/02/16 1043      Assessment   Medical Diagnosis gait abnormality, multiple falls, leg weakness   Referring Provider Dr. Lavone Orn   Onset Date/Surgical Date 09/28/16  MD order-falling past 2 years     Precautions   Precautions Fall     Balance Screen   Has the patient fallen in the past 6 months Yes   How many times? --  multiple falls-unable to give number   Has the patient had a decrease in activity level because of a fear of falling?  No   Is the patient reluctant to leave their home because of a fear of falling?  No     Home Environment   Living Environment Private residence   Living Arrangements Spouse/significant other   Available Help at Discharge  Family   Type of [place] to enter   Entrance Stairs-Number of Steps 3   Entrance Stairs-Rails Right   [place] One level     Prior Function   Level of Independence Independent with basic ADLs;Independent with community mobility with device   Vocation Retired  Patent examiner --  Pt reports short term memory loss     Observation/Other Assessments   Focus on Therapeutic Outcomes (FOTO)  ABC score 80%, Functional Status Intake score 45%     ROM / Strength   AROM / PROM / Strength Strength     Strength   Overall Strength Deficits   Strength Assessment Site Hip;Knee;Ankle   Right/Left Hip Right;Left   Right Hip Flexion 3+/5   Left Hip Flexion 3+/5   Right/Left Knee Right;Left   Right Knee Flexion 4/5   Right Knee Extension 3+/5   Left Knee Flexion 4/5   Left Knee Extension 3+/5   Right/Left Ankle Right;Left   Right Ankle Dorsiflexion 3-/5   Left Ankle Dorsiflexion 3-/5     Transfers   Transfers Sit to Stand;Stand to Sit   Sit to Stand 6: Modified independent (Device/Increase time);With upper extremity assist;From chair/3-in-1   Stand to Sit 6: Modified independent (Device/Increase time);With armrests;To chair/3-in-1     Ambulation/Gait   Ambulation/Gait Yes   Ambulation/Gait Assistance 6: Modified independent (Device/Increase time)   Ambulation Distance (Feet) 200 Feet   Assistive device Straight cane   Gait Pattern Step-through pattern;Decreased dorsiflexion - left;Trendelenburg;Wide base of support;Poor foot clearance - left;Poor foot clearance - right   Ambulation Surface Level;Indoor   Gait velocity 15.62 sec = 2.1 ft/sec     Standardized Balance Assessment   Standardized Balance Assessment Timed Up and Go Test;Dynamic Gait Index     Dynamic Gait Index   Level Surface Moderate Impairment   Change in Gait Speed Moderate Impairment   Gait with Horizontal Head Turns Moderate Impairment   Gait with Vertical Head Turns  Moderate Impairment   Gait and Pivot Turn Mild Impairment   Step Over Obstacle Severe Impairment   Step Around Obstacles Mild Impairment   Steps Moderate Impairment   Total Score 9   DGI comment: Scores <19/24 indicate increased fall risk.     Timed Up and Go Test   Normal TUG (seconds) 26.59   TUG Comments Scores >13.5 sec indicate increased fall risk.                             PT Short Term Goals - 10/02/16 2042      PT SHORT TERM GOAL #1   Title Pt will be independent with HEP for improved balance, strength and gait.  TARGET 11/01/16   Time 4   Period Weeks   Status New     PT SHORT TERM GOAL #2   Title Pt will improve TUG score to less than or equal to 20 seconds for decreased fall risk.   Time 4   Period Weeks   Status New     PT SHORT TERM GOAL #3   Title Pt will improve Dynamic Gait Index score to at least 12/24 for decreased fall risk.   Time 4   Period Weeks   Status New     PT SHORT TERM GOAL #4   Title Sensory Organization test to be assessed, with goal to be written as appropriate.   Time 4   Period Weeks   Status New           PT Long Term Goals - 10/02/16 2043      PT LONG TERM GOAL #1   Title Pt will verbalize understanding of fall prevention in the home environment.  TARGET 12/01/16   Time 8   Period Weeks   Status  New     PT LONG TERM GOAL #2   Title Pt will improve TUG score to less than or equal to 17 seconds for decreased fall risk.   Time 8   Period Weeks   Status New     PT LONG TERM GOAL #3   Title Pt will improve Dynamic Gait Index score to at least 17/24 for decreased fall risk.   Time 8   Period Weeks   Status New     PT LONG TERM GOAL #4   Title Pt will improve gait velocity to at least 2.62 ft/sec for improved gait efficiency and safety.   Time 8   Period Weeks   Status New     PT LONG TERM GOAL #5   Title Pt will verbalize plans for continued community fitness upon D/C from PT.   Time 8    Period Weeks   Status New     Additional Long Term Goals   Additional Long Term Goals Yes     PT LONG TERM GOAL #6   Title Pt will improve FOTO status score by at least 10% to demo improved balance.   Time 8   Period Weeks   Status New               Plan - Oct 13, 2016 2035/08/27    Clinical Impression Statement Pt is a 76 year old male who presents to OP PT with dx of gait abnormality, multiple falls, leg weakness.  He has had multiple falls over the past 6 months, with falls starting about 2 years ago.  He reports he typically catches his foot on the ground and is unable to regain balance.  He presents with lower extremity weakness, decreased ankle dorsiflexor ROM, decreased balance, decreased gait speed and independence.  Pt is at increased fall risk per TUG score of 26.59 and Dynamic Gait Index score of 9/24; gait velocity of 2.1 ft/sec indicates limited community ambulator.  Pt would benefit from skilled physical therapy to address the above stated deficits to improve mobility and decrease fall risk.   Rehab Potential Good   PT Frequency 2x / week   PT Duration 8 weeks  plus eval   PT Treatment/Interventions ADLs/Self Care Home Management;Functional mobility training;Gait training;Therapeutic activities;Therapeutic exercise;Balance training;Neuromuscular re-education;Patient/family education;Orthotic Fit/Training   PT Next Visit Plan Perform Sensory Organization test, initiate HEP to address balance and strength   Consulted and Agree with Plan of Care Patient      Patient will benefit from skilled therapeutic intervention in order to improve the following deficits and impairments:  Abnormal gait, Decreased balance, Decreased mobility, Decreased range of motion, Decreased strength, Impaired flexibility, Difficulty walking  Visit Diagnosis: Other abnormalities of gait and mobility  Unsteadiness on feet  Muscle weakness (generalized)      G-Codes - 10-13-2016 26-Aug-2044    Functional  Assessment Tool Used (Outpatient Only) TUG 26.59 sec, gait velocity 2.1 ft/sec, DGI 9/24   Functional Limitation Mobility: Walking and moving around   Mobility: Walking and Moving Around Current Status [id number]) At least 40 percent but less than 60 percent impaired, limited or restricted   Mobility: Walking and Moving Around Goal Status [phone]) At least 20 percent but less than 40 percent impaired, limited or restricted       Problem List Patient Active Problem List   Diagnosis Date Noted  . Essential hypertension 02/06/2014  . Chronic diastolic heart failure ([place]) 02/06/2014  . Chronic kidney disease, stage III (moderate) 02/06/2014  .  Aftercare following surgery of the circulatory system, [place] 01/25/2014  . Carotid stenosis 06/08/2013  . Stricture of artery ([place]) 04/14/2012  . Peripheral vascular disease, unspecified ([place]) 10/08/2011  . DVT of upper extremity (deep vein thrombosis), left 09/29/2011  . S/P CABG (coronary artery bypass graft) 09/29/2011  . H/O carotid endarterectomy 09/29/2011  . Occlusion and stenosis of carotid artery without mention of cerebral infarction 06/11/2011  . Diabetes mellitus type 2 with complications ([place]) 82/95/6213    Jersi Mcmaster W. 10/02/2016, 8:53 PM  Frazier Butt., PT   [facility] [address] [place], [place], [address] Phone: [phone]   Fax:  [phone]  Name: [name] MRN: [id number] Date of Birth: 05-27-1940

## 2016-10-05 ENCOUNTER — Ambulatory Visit: Payer: Medicare HMO | Admitting: Physical Therapy

## 2016-10-05 ENCOUNTER — Encounter: Payer: Self-pay | Admitting: Physical Therapy

## 2016-10-05 DIAGNOSIS — M6281 Muscle weakness (generalized): Secondary | ICD-10-CM | POA: Diagnosis not present

## 2016-10-05 DIAGNOSIS — R2681 Unsteadiness on feet: Secondary | ICD-10-CM

## 2016-10-05 DIAGNOSIS — R2689 Other abnormalities of gait and mobility: Secondary | ICD-10-CM | POA: Diagnosis not present

## 2016-10-05 NOTE — Therapy (Signed)
[facility] [address] [place], [place], [address] Phone: [phone]   Fax:  [phone]  Physical Therapy Treatment  Patient Details  Name: [name] MRN: [id number] Date of Birth: Jul 22, 1940 Referring Provider: Dr. Lavone Orn  Encounter Date: 10/05/2016      PT End of Session - 10/05/16 1316    Visit Number 2   Number of Visits 17   Date for PT Re-Evaluation 12/01/16   Authorization Type [employer] Medicare/[employer] Medicare HMO-GCODE every 10th visit   PT Start Time 1233   PT Stop Time 1315   PT Time Calculation (min) 42 min   Equipment Utilized During Treatment Gait belt   Activity Tolerance Patient tolerated treatment well   Behavior During Therapy WFL for tasks assessed/performed      Past Medical History:  Diagnosis Date  . Arthritis   . Atherosclerotic heart disease   . Back pain   . Bruises easily    takes Pletal daily and ASA 325mg  daily  . Cataract immature    right  . Chest pain   . Colon polyps   . Cyst    on perineum;takes Doxycycline daily  . Diabetes mellitus    takes Glipizide,Metformin,and Actos daily  . Diarrhea   . Edema    right leg knee down swollen since fall 3 wks ago  . GERD (gastroesophageal reflux disease)    Rolaids as needed-occ reflux  . Hemorrhoids   . History of kidney stones    at age 79   . Hyperlipidemia    takes Tricor and Lipitor daily  . Hyperlipidemia   . Hypertension    takes Altace,Amlodipine,and Nadolol daily  . Kidney stone    35+yrs ago  . Muscle pain   . Nasal polyps    hx of  . Peripheral edema    wears knee length hose-told by podiatrist to wear these  . Peripheral neuropathy   . Peripheral neuropathy    takes Gabapentin daily  . Pneumonia    as a child  . PVD (peripheral vascular disease) ([place])   . Renal insufficiency   . Seasonal allergies    takes Mucinex and Zyrtec prn  . Urinary frequency    urgency/takes Vesicare daily     Past Surgical History:  Procedure Laterality Date  . Aortobifemoral bypass    . CARDIAC CATHETERIZATION  most recent 05/2011   total of 4  . CAROTID ANGIOGRAM N/A 06/05/2011   Procedure: CAROTID ANGIOGRAM;  Surgeon: Elam Dutch, MD;  Location: [facility] CATH LAB;  Service: Cardiovascular;  Laterality: N/A;  . CAROTID ENDARTERECTOMY  04/08/2011   left CEA  . cataract surgery     left  . COLONOSCOPY    . COLONOSCOPY WITH PROPOFOL N/A 08/22/2013   Procedure: COLONOSCOPY WITH PROPOFOL;  Surgeon: Garlan Fair, MD;  Location: [facility] ENDOSCOPY;  Service: Endoscopy;  Laterality: N/A;  . CORONARY ARTERY BYPASS GRAFT  09/09/2011   Procedure: CORONARY ARTERY BYPASS GRAFTING (CABG);  Surgeon: Gaye Pollack, MD;  Location: [facility];  Service: Open Heart Surgery;  Laterality: N/A;  Coronary artery bypass grafting  x three with Right saphenous vein harvested endoscopically and left internal mammary artery  . ENDARTERECTOMY  04/08/2011   Procedure: ENDARTERECTOMY CAROTID;  Surgeon: Elam Dutch, MD;  Location: [facility] OR;  Service: Vascular;  Laterality: Left;  with patch angioplasty  . ENDARTERECTOMY  09/09/2011   Procedure: ENDARTERECTOMY CAROTID;  Surgeon: Elam Dutch, MD;  Location: [facility]  OR;  Service: Vascular;  Laterality: Right;  with patch angioplasty  . KNEE ARTHROSCOPY  2011  . LEFT HEART CATHETERIZATION WITH CORONARY ANGIOGRAM N/A 07/09/2011   Procedure: LEFT HEART CATHETERIZATION WITH CORONARY ANGIOGRAM;  Surgeon: Sinclair Grooms, MD;  Location: [facility] CATH LAB;  Service: Cardiovascular;  Laterality: N/A;  . Reimplantation of inferior mesenteric artery    . Repair of infrarenal abdominal aortic aneurysm    . SHOULDER ARTHROSCOPY W/ ROTATOR CUFF REPAIR     right and left-open procedures  . TONSILLECTOMY     at age 42  . TRIGGER FINGER RELEASE Right 06/28/2014   Procedure: RIGHT LONG TRIGGER RELEASE ;  Surgeon: Leanora Cover, MD;  Location: [facility];  Service: Orthopedics;  Laterality:  Right;  . wisdom teeth extracted     as a teenager    There were no vitals filed for this visit.      Subjective Assessment - 10/05/16 1241    Subjective "Just walking back to the gym from the front office is like runnning a mile." "I hate to walk." Pt has a nustep at an [place] lodge close to his house that he went to intermittently before falling on his tail bone.   Pertinent History arthritis, DM, HTN, peripheral neuropathy, PVD, CABG 2013, R RTC repair   Patient Stated Goals Pt's goal for therapy is to be able to walk wiithout cane, not to have to be so careful about walking.   Currently in Pain? Yes   Pain Score 3    Pain Location Sacrum   Pain Orientation Mid   Pain Descriptors / Indicators Sore   Pain Type Acute pain   Pain Onset 1 to 4 weeks ago   Pain Frequency Intermittent                         [facility] Adult PT Treatment/Exercise - 10/05/16 0001      Knee/Hip Exercises: Aerobic   Other Aerobic Scit fit level 5 to 7  (back to level 5 due to aggravation to shoulder), all extremities, 15 min continuous.                PT Education - 10/05/16 1259    Education provided Yes   Education Details HEP for nustep for strengthening and aerobic activity. Discussed the importance of aerobic activity multiple times per week, the importance on increasing activtiy gradually since last fall and  plan for next visit.   Person(s) Educated Patient   Methods Explanation;Demonstration;Verbal cues;Handout   Comprehension Verbalized understanding;Returned demonstration;Need further instruction;Verbal cues required          PT Short Term Goals - 10/02/16 2042      PT SHORT TERM GOAL #1   Title Pt will be independent with HEP for improved balance, strength and gait.  TARGET 11/01/16   Time 4   Period Weeks   Status New     PT SHORT TERM GOAL #2   Title Pt will improve TUG score to less than or equal to 20 seconds for decreased fall risk.   Time 4   Period Weeks    Status New     PT SHORT TERM GOAL #3   Title Pt will improve Dynamic Gait Index score to at least 12/24 for decreased fall risk.   Time 4   Period Weeks   Status New     PT SHORT TERM GOAL #4   Title Customer service manager  to be assessed, with goal to be written as appropriate.   Time 4   Period Weeks   Status New           PT Long Term Goals - 10/02/16 2043      PT LONG TERM GOAL #1   Title Pt will verbalize understanding of fall prevention in the home environment.  TARGET 12/01/16   Time 8   Period Weeks   Status New     PT LONG TERM GOAL #2   Title Pt will improve TUG score to less than or equal to 17 seconds for decreased fall risk.   Time 8   Period Weeks   Status New     PT LONG TERM GOAL #3   Title Pt will improve Dynamic Gait Index score to at least 17/24 for decreased fall risk.   Time 8   Period Weeks   Status New     PT LONG TERM GOAL #4   Title Pt will improve gait velocity to at least 2.62 ft/sec for improved gait efficiency and safety.   Time 8   Period Weeks   Status New     PT LONG TERM GOAL #5   Title Pt will verbalize plans for continued community fitness upon D/C from PT.   Time 8   Period Weeks   Status New     Additional Long Term Goals   Additional Long Term Goals Yes     PT LONG TERM GOAL #6   Title Pt will improve FOTO status score by at least 10% to demo improved balance.   Time 8   Period Weeks   Status New               Plan - 10/05/16 1300    Clinical Impression Statement Initiated HEP for strengthening and aerobic activity on pt's Nustep that he has at lodge close to his house.  Pt had many questions about exercise and worked on educating pt about exercise (see pt instructions).  Pt tolerated Nustep actvitiy  but at a lower resistance level and less time then before pt had his most recent fall.                                     Rehab Potential Good   PT Frequency 2x / week   PT Duration 8 weeks  plus eval    PT Treatment/Interventions ADLs/Self Care Home Management;Functional mobility training;Gait training;Therapeutic activities;Therapeutic exercise;Balance training;Neuromuscular re-education;Patient/family education;Orthotic Fit/Training   PT Next Visit Plan Perform Sensory Organization test, initiate HEP to address balance and strength   Consulted and Agree with Plan of Care Patient      Patient will benefit from skilled therapeutic intervention in order to improve the following deficits and impairments:  Abnormal gait, Decreased balance, Decreased mobility, Decreased range of motion, Decreased strength, Impaired flexibility, Difficulty walking  Visit Diagnosis: Other abnormalities of gait and mobility  Unsteadiness on feet  Muscle weakness (generalized)     Problem List Patient Active Problem List   Diagnosis Date Noted  . Essential hypertension 02/06/2014  . Chronic diastolic heart failure ([place]) 02/06/2014  . Chronic kidney disease, stage III (moderate) 02/06/2014  . Aftercare following surgery of the circulatory system, [place] 01/25/2014  . Carotid stenosis 06/08/2013  . Stricture of artery ([place]) 04/14/2012  . Peripheral vascular disease, unspecified ([place]) 10/08/2011  . DVT of upper extremity (deep  vein thrombosis), left 09/29/2011  . S/P CABG (coronary artery bypass graft) 09/29/2011  . H/O carotid endarterectomy 09/29/2011  . Occlusion and stenosis of carotid artery without mention of cerebral infarction 06/11/2011  . Diabetes mellitus type 2 with complications ([place]) 09/38/1829    Bjorn Loser, PTA  10/05/16, 2:44 PM [facility] [address] [place], [place], [address] Phone: [phone]   Fax:  [phone]  Name: DENI LEFEVER MRN: [id number] Date of Birth: 08-Sep-1940

## 2016-10-05 NOTE — Patient Instructions (Addendum)
Nu-step:   For strengthening and aerobic fitness to increase functional activity tolerance  Aim: Challenging pace for  15 min working gradually towards 30 min  2-3x/week.  !!!!Avoid aggravating Joints so start resistance at Level 5.0

## 2016-10-07 ENCOUNTER — Ambulatory Visit: Payer: Medicare HMO | Admitting: Physical Therapy

## 2016-10-07 DIAGNOSIS — R2681 Unsteadiness on feet: Secondary | ICD-10-CM

## 2016-10-07 DIAGNOSIS — R2689 Other abnormalities of gait and mobility: Secondary | ICD-10-CM | POA: Diagnosis not present

## 2016-10-07 DIAGNOSIS — M6281 Muscle weakness (generalized): Secondary | ICD-10-CM

## 2016-10-07 NOTE — Therapy (Signed)
[facility] [address] [place], [place], [address] Phone: [phone]   Fax:  [phone]  Physical Therapy Treatment  Patient Details  Name: [name] MRN: [id number] Date of Birth: 07/11/1940 Referring Provider: Dr. Lavone Orn  Encounter Date: 10/07/2016      PT End of Session - 10/07/16 1317    Visit Number 3   Number of Visits 17   Date for PT Re-Evaluation 12/01/16   Authorization Type [employer] Medicare/[employer] HMO-GCODE every 10th visit   PT Start Time 1158   PT Stop Time 1236   PT Time Calculation (min) 38 min   Equipment Utilized During Treatment Gait belt   Activity Tolerance Patient tolerated treatment well   Behavior During Therapy WFL for tasks assessed/performed      Past Medical History:  Diagnosis Date  . Arthritis   . Atherosclerotic heart disease   . Back pain   . Bruises easily    takes Pletal daily and ASA 325mg  daily  . Cataract immature    right  . Chest pain   . Colon polyps   . Cyst    on perineum;takes Doxycycline daily  . Diabetes mellitus    takes Glipizide,Metformin,and Actos daily  . Diarrhea   . Edema    right leg knee down swollen since fall 3 wks ago  . GERD (gastroesophageal reflux disease)    Rolaids as needed-occ reflux  . Hemorrhoids   . History of kidney stones    at age 78   . Hyperlipidemia    takes Tricor and Lipitor daily  . Hyperlipidemia   . Hypertension    takes Altace,Amlodipine,and Nadolol daily  . Kidney stone    35+yrs ago  . Muscle pain   . Nasal polyps    hx of  . Peripheral edema    wears knee length hose-told by podiatrist to wear these  . Peripheral neuropathy   . Peripheral neuropathy    takes Gabapentin daily  . Pneumonia    as a child  . PVD (peripheral vascular disease) ([place])   . Renal insufficiency   . Seasonal allergies    takes Mucinex and Zyrtec prn  . Urinary frequency    urgency/takes Vesicare daily     Past Surgical History:  Procedure Laterality Date  . Aortobifemoral bypass    . CARDIAC CATHETERIZATION  most recent 05/2011   total of 4  . CAROTID ANGIOGRAM N/A 06/05/2011   Procedure: CAROTID ANGIOGRAM;  Surgeon: Elam Dutch, MD;  Location: [facility] CATH LAB;  Service: Cardiovascular;  Laterality: N/A;  . CAROTID ENDARTERECTOMY  04/08/2011   left CEA  . cataract surgery     left  . COLONOSCOPY    . COLONOSCOPY WITH PROPOFOL N/A 08/22/2013   Procedure: COLONOSCOPY WITH PROPOFOL;  Surgeon: Garlan Fair, MD;  Location: [facility] ENDOSCOPY;  Service: Endoscopy;  Laterality: N/A;  . CORONARY ARTERY BYPASS GRAFT  09/09/2011   Procedure: CORONARY ARTERY BYPASS GRAFTING (CABG);  Surgeon: Gaye Pollack, MD;  Location: [facility];  Service: Open Heart Surgery;  Laterality: N/A;  Coronary artery bypass grafting  x three with Right saphenous vein harvested endoscopically and left internal mammary artery  . ENDARTERECTOMY  04/08/2011   Procedure: ENDARTERECTOMY CAROTID;  Surgeon: Elam Dutch, MD;  Location: [facility] OR;  Service: Vascular;  Laterality: Left;  with patch angioplasty  . ENDARTERECTOMY  09/09/2011   Procedure: ENDARTERECTOMY CAROTID;  Surgeon: Elam Dutch, MD;  Location[facility]  OR;  Service: Vascular;  Laterality: Right;  with patch angioplasty  . KNEE ARTHROSCOPY  2011  . LEFT HEART CATHETERIZATION WITH CORONARY ANGIOGRAM N/A 07/09/2011   Procedure: LEFT HEART CATHETERIZATION WITH CORONARY ANGIOGRAM;  Surgeon: Sinclair Grooms, MD;  Location: [facility] CATH LAB;  Service: Cardiovascular;  Laterality: N/A;  . Reimplantation of inferior mesenteric artery    . Repair of infrarenal abdominal aortic aneurysm    . SHOULDER ARTHROSCOPY W/ ROTATOR CUFF REPAIR     right and left-open procedures  . TONSILLECTOMY     at age 15  . TRIGGER FINGER RELEASE Right 06/28/2014   Procedure: RIGHT LONG TRIGGER RELEASE ;  Surgeon: Leanora Cover, MD;  Location: [facility];  Service: Orthopedics;  Laterality:  Right;  . wisdom teeth extracted     as a teenager    There were no vitals filed for this visit.      Subjective Assessment - 10/07/16 1311    Subjective Pt reports that his neck has been bothering him since yesterday and not sure why.  Went on the Nu step Monday evening.   Pertinent History arthritis, DM, HTN, peripheral neuropathy, PVD, CABG 2013, R RTC repair   Patient Stated Goals Pt's goal for therapy is to be able to walk wiithout cane, not to have to be so careful about walking.   Currently in Pain? Yes   Pain Score 3    Pain Location Neck   Pain Orientation Posterior   Pain Descriptors / Indicators Tightness;Sharp   Pain Type Acute pain   Pain Onset Yesterday   Pain Frequency Intermittent   Aggravating Factors  Certain movements.   Pain Relieving Factors changing positions.          THERAPEUTIC ACTIVITIES: SOT SCORE above or within normall limits for condition 1,2,4 Falls for condition 5,6 Partial below and above for condition 3. Composite score 46 - below normal limits   Scored above average with somatosensory, vision, and preferential, but score less than 5% with vestibular.                    Balance Exercises - 10/07/16 1313      Balance Exercises: Standing   Standing Eyes Opened Wide (BOA);Head turns;5 reps  intermittent UE support           PT Education - 10/07/16 1316    Education provided Yes   Education Details Educated pt on results of SOT and POC for treatment and HEP.   Person(s) Educated Patient   Methods Explanation;Demonstration;Verbal cues;Handout   Comprehension Verbalized understanding;Returned demonstration;Verbal cues required;Need further instruction          PT Short Term Goals - 10/02/16 2042      PT SHORT TERM GOAL #1   Title Pt will be independent with HEP for improved balance, strength and gait.  TARGET 11/01/16   Time 4   Period Weeks   Status New     PT SHORT TERM GOAL #2   Title Pt will improve TUG  score to less than or equal to 20 seconds for decreased fall risk.   Time 4   Period Weeks   Status New     PT SHORT TERM GOAL #3   Title Pt will improve Dynamic Gait Index score to at least 12/24 for decreased fall risk.   Time 4   Period Weeks   Status New     PT SHORT TERM GOAL #4   Title Sensory  Organization test to be assessed, with goal to be written as appropriate.   Time 4   Period Weeks   Status New           PT Long Term Goals - 10/02/16 2043      PT LONG TERM GOAL #1   Title Pt will verbalize understanding of fall prevention in the home environment.  TARGET 12/01/16   Time 8   Period Weeks   Status New     PT LONG TERM GOAL #2   Title Pt will improve TUG score to less than or equal to 17 seconds for decreased fall risk.   Time 8   Period Weeks   Status New     PT LONG TERM GOAL #3   Title Pt will improve Dynamic Gait Index score to at least 17/24 for decreased fall risk.   Time 8   Period Weeks   Status New     PT LONG TERM GOAL #4   Title Pt will improve gait velocity to at least 2.62 ft/sec for improved gait efficiency and safety.   Time 8   Period Weeks   Status New     PT LONG TERM GOAL #5   Title Pt will verbalize plans for continued community fitness upon D/C from PT.   Time 8   Period Weeks   Status New     Additional Long Term Goals   Additional Long Term Goals Yes     PT LONG TERM GOAL #6   Title Pt will improve FOTO status score by at least 10% to demo improved balance.   Time 8   Period Weeks   Status New               Plan - 10/07/16 1318    Clinical Impression Statement Pt scored above average with somatosensory, vision, and preferential, but score less than 5% with vestibular. Overall composite score was below average at 46.  Initiated HEP for vestibular training; pt required intermittent UE support, standing on solid surface with eyes closed and head turns.   Rehab Potential Good   PT Frequency 2x / week   PT  Duration 8 weeks  plus eval   PT Treatment/Interventions ADLs/Self Care Home Management;Functional mobility training;Gait training;Therapeutic activities;Therapeutic exercise;Balance training;Neuromuscular re-education;Patient/family education;Orthotic Fit/Training   PT Next Visit Plan Review and add HEP to address balance and strength; vestibular training   Consulted and Agree with Plan of Care Patient      Patient will benefit from skilled therapeutic intervention in order to improve the following deficits and impairments:  Abnormal gait, Decreased balance, Decreased mobility, Decreased range of motion, Decreased strength, Impaired flexibility, Difficulty walking  Visit Diagnosis: Other abnormalities of gait and mobility  Unsteadiness on feet  Muscle weakness (generalized)     Problem List Patient Active Problem List   Diagnosis Date Noted  . Essential hypertension 02/06/2014  . Chronic diastolic heart failure ([place]) 02/06/2014  . Chronic kidney disease, stage III (moderate) 02/06/2014  . Aftercare following surgery of the circulatory system, [place] 01/25/2014  . Carotid stenosis 06/08/2013  . Stricture of artery ([place]) 04/14/2012  . Peripheral vascular disease, unspecified ([place]) 10/08/2011  . DVT of upper extremity (deep vein thrombosis), left 09/29/2011  . S/P CABG (coronary artery bypass graft) 09/29/2011  . H/O carotid endarterectomy 09/29/2011  . Occlusion and stenosis of carotid artery without mention of cerebral infarction 06/11/2011  . Diabetes mellitus type 2 with complications ([place]) 57/05/7791   Oneita Kras  Ishmael Holter, [place]  10/07/16, 3:56 PM [facility] [address] [place], [place], [address] Phone: [phone]   Fax:  [phone]  Name: BEN HABERMANN MRN: [id number] Date of Birth: [birth year]

## 2016-10-07 NOTE — Patient Instructions (Addendum)
Feet Apart, Head Motion - Eyes Closed    With eyes closed and feet shoulder width apart, move head slowly, up and down, side to side, and diagonally. Repeat _5-10___ times per session. Do __1__ sessions per day.  Copyright  VHI. All rights reserved.

## 2016-10-12 ENCOUNTER — Ambulatory Visit: Payer: Medicare HMO | Admitting: Physical Therapy

## 2016-10-14 ENCOUNTER — Encounter: Payer: Self-pay | Admitting: Physical Therapy

## 2016-10-14 ENCOUNTER — Ambulatory Visit: Payer: Medicare HMO | Admitting: Physical Therapy

## 2016-10-14 DIAGNOSIS — M6281 Muscle weakness (generalized): Secondary | ICD-10-CM | POA: Diagnosis not present

## 2016-10-14 DIAGNOSIS — R2681 Unsteadiness on feet: Secondary | ICD-10-CM

## 2016-10-14 DIAGNOSIS — R2689 Other abnormalities of gait and mobility: Secondary | ICD-10-CM | POA: Diagnosis not present

## 2016-10-14 NOTE — Therapy (Signed)
[facility] [address] [place], [place], [address] Phone: [phone]   Fax:  [phone]  Physical Therapy Treatment  Patient Details  Name: [name] MRN: [id number] Date of Birth: 12-Nov-1940 Referring Provider: Dr. Lavone Orn  Encounter Date: 10/14/2016    Past Medical History:  Diagnosis Date  . Arthritis   . Atherosclerotic heart disease   . Back pain   . Bruises easily    takes Pletal daily and ASA 325mg  daily  . Cataract immature    right  . Chest pain   . Colon polyps   . Cyst    on perineum;takes Doxycycline daily  . Diabetes mellitus    takes Glipizide,Metformin,and Actos daily  . Diarrhea   . Edema    right leg knee down swollen since fall 3 wks ago  . GERD (gastroesophageal reflux disease)    Rolaids as needed-occ reflux  . Hemorrhoids   . History of kidney stones    at age 77   . Hyperlipidemia    takes Tricor and Lipitor daily  . Hyperlipidemia   . Hypertension    takes Altace,Amlodipine,and Nadolol daily  . Kidney stone    35+yrs ago  . Muscle pain   . Nasal polyps    hx of  . Peripheral edema    wears knee length hose-told by podiatrist to wear these  . Peripheral neuropathy   . Peripheral neuropathy    takes Gabapentin daily  . Pneumonia    as a child  . PVD (peripheral vascular disease) ([place])   . Renal insufficiency   . Seasonal allergies    takes Mucinex and Zyrtec prn  . Urinary frequency    urgency/takes Vesicare daily    Past Surgical History:  Procedure Laterality Date  . Aortobifemoral bypass    . CARDIAC CATHETERIZATION  most recent 05/2011   total of 4  . CAROTID ANGIOGRAM N/A 06/05/2011   Procedure: CAROTID ANGIOGRAM;  Surgeon: Elam Dutch, MD;  Location: [facility] CATH LAB;  Service: Cardiovascular;  Laterality: N/A;  . CAROTID ENDARTERECTOMY  04/08/2011   left CEA  . cataract surgery     left  . COLONOSCOPY    . COLONOSCOPY WITH PROPOFOL N/A  08/22/2013   Procedure: COLONOSCOPY WITH PROPOFOL;  Surgeon: Garlan Fair, MD;  Location: [facility] ENDOSCOPY;  Service: Endoscopy;  Laterality: N/A;  . CORONARY ARTERY BYPASS GRAFT  09/09/2011   Procedure: CORONARY ARTERY BYPASS GRAFTING (CABG);  Surgeon: Gaye Pollack, MD;  Location: [facility];  Service: Open Heart Surgery;  Laterality: N/A;  Coronary artery bypass grafting  x three with Right saphenous vein harvested endoscopically and left internal mammary artery  . ENDARTERECTOMY  04/08/2011   Procedure: ENDARTERECTOMY CAROTID;  Surgeon: Elam Dutch, MD;  Location[facility] OR;  Service: Vascular;  Laterality: Left;  with patch angioplasty  . ENDARTERECTOMY  09/09/2011   Procedure: ENDARTERECTOMY CAROTID;  Surgeon: Elam Dutch, MD;  Location: [facility] OR;  Service: Vascular;  Laterality: Right;  with patch angioplasty  . KNEE ARTHROSCOPY  2011  . LEFT HEART CATHETERIZATION WITH CORONARY ANGIOGRAM N/A 07/09/2011   Procedure: LEFT HEART CATHETERIZATION WITH CORONARY ANGIOGRAM;  Surgeon: Sinclair Grooms, MD;  Location: [facility] CATH LAB;  Service: Cardiovascular;  Laterality: N/A;  . Reimplantation of inferior mesenteric artery    . Repair of infrarenal abdominal aortic aneurysm    . SHOULDER ARTHROSCOPY W/ ROTATOR CUFF REPAIR     right and left-open procedures  .  TONSILLECTOMY     at age 75  . TRIGGER FINGER RELEASE Right 06/28/2014   Procedure: RIGHT LONG TRIGGER RELEASE ;  Surgeon: Leanora Cover, MD;  Location: [facility];  Service: Orthopedics;  Laterality: Right;  . wisdom teeth extracted     as a teenager    There were no vitals filed for this visit.      Subjective Assessment - 10/14/16 1112    Subjective Pt has been doing HEP  (corner balance exercises) in the shower.   Pertinent History arthritis, DM, HTN, peripheral neuropathy, PVD, CABG 2013, R RTC repair   Patient Stated Goals Pt's goal for therapy is to be able to walk wiithout cane, not to have to be so careful about walking.    Currently in Pain? Yes   Pain Score 2    Pain Location Leg   Pain Orientation Right;Left   Pain Descriptors / Indicators Aching   Pain Type Chronic pain   Pain Onset More than a month ago   Pain Frequency Constant                         [facility] Adult PT Treatment/Exercise - 10/14/16 0001      Exercises   Exercises Knee/Hip     Knee/Hip Exercises: Seated   Sit to Sand 2 sets;5 reps;with UE support  progressed to a lower seating surface.     Knee/Hip Exercises: Supine   Bridges Strengthening;2 sets;10 reps   SLR  (flexion)                                                            Strengthening; 2 sets; 10reps each LE                                                                            PT Education - 10/14/16 1159    Education provided Yes   Education Details Recommend pt not do balance exercises in the shower for safety reasons.  Added LE strengthening to HEP.   Person(s) Educated Patient   Methods Explanation;Demonstration;Verbal cues;Handout   Comprehension Verbalized understanding;Returned demonstration;Verbal cues required;Need further instruction          PT Short Term Goals - 10/02/16 2042      PT SHORT TERM GOAL #1   Title Pt will be independent with HEP for improved balance, strength and gait.  TARGET 11/01/16   Time 4   Period Weeks   Status New     PT SHORT TERM GOAL #2   Title Pt will improve TUG score to less than or equal to 20 seconds for decreased fall risk.   Time 4   Period Weeks   Status New     PT SHORT TERM GOAL #3   Title Pt will improve Dynamic Gait Index score to at least 12/24 for decreased fall risk.   Time 4   Period Weeks   Status New     PT SHORT TERM GOAL #  4   Title Sensory Organization test to be assessed, with goal to be written as appropriate.   Time 4   Period Weeks   Status New           PT Long Term Goals - 10/02/16 2043      PT LONG TERM GOAL #1   Title Pt will verbalize  understanding of fall prevention in the home environment.  TARGET 12/01/16   Time 8   Period Weeks   Status New     PT LONG TERM GOAL #2   Title Pt will improve TUG score to less than or equal to 17 seconds for decreased fall risk.   Time 8   Period Weeks   Status New     PT LONG TERM GOAL #3   Title Pt will improve Dynamic Gait Index score to at least 17/24 for decreased fall risk.   Time 8   Period Weeks   Status New     PT LONG TERM GOAL #4   Title Pt will improve gait velocity to at least 2.62 ft/sec for improved gait efficiency and safety.   Time 8   Period Weeks   Status New     PT LONG TERM GOAL #5   Title Pt will verbalize plans for continued community fitness upon D/C from PT.   Time 8   Period Weeks   Status New     Additional Long Term Goals   Additional Long Term Goals Yes     PT LONG TERM GOAL #6   Title Pt will improve FOTO status score by at least 10% to demo improved balance.   Time 8   Period Weeks   Status New               Plan - 10/14/16 1122    Clinical Impression Statement Training for LE stregthening; pt was challeneged with bridging, SLR, and sit <>stands without UE support, requiring cues for technique.   Rehab Potential Good   PT Frequency 2x / week   PT Duration 8 weeks  plus eval   PT Treatment/Interventions ADLs/Self Care Home Management;Functional mobility training;Gait training;Therapeutic activities;Therapeutic exercise;Balance training;Neuromuscular re-education;Patient/family education;Orthotic Fit/Training   PT Next Visit Plan Review and gradually add HEP to address balance and strength; vestibular training; LE strengthening.   Consulted and Agree with Plan of Care Patient      Patient will benefit from skilled therapeutic intervention in order to improve the following deficits and impairments:  Abnormal gait, Decreased balance, Decreased mobility, Decreased range of motion, Decreased strength, Impaired flexibility,  Difficulty walking  Visit Diagnosis: Other abnormalities of gait and mobility  Unsteadiness on feet  Muscle weakness (generalized)     Problem List Patient Active Problem List   Diagnosis Date Noted  . Essential hypertension 02/06/2014  . Chronic diastolic heart failure ([place]) 02/06/2014  . Chronic kidney disease, stage III (moderate) 02/06/2014  . Aftercare following surgery of the circulatory system, [place] 01/25/2014  . Carotid stenosis 06/08/2013  . Stricture of artery ([place]) 04/14/2012  . Peripheral vascular disease, unspecified ([place]) 10/08/2011  . DVT of upper extremity (deep vein thrombosis), left 09/29/2011  . S/P CABG (coronary artery bypass graft) 09/29/2011  . H/O carotid endarterectomy 09/29/2011  . Occlusion and stenosis of carotid artery without mention of cerebral infarction 06/11/2011  . Diabetes mellitus type 2 with complications ([place]) 35/57/3220    Bjorn Loser, PTA  10/14/16, 12:07 PM [facility] [address]  [place], [place], [address] Phone: [phone]   Fax:  [phone]  Name: JAXIEL KINES MRN: [id number] Date of Birth: [birth year]

## 2016-10-14 NOTE — Patient Instructions (Addendum)
Bridging    Slowly raise buttocks from floor, keeping stomach tight. Repeat _10___ times per set. Do _2__ sets per session. Do _1___ sessions per day.  http://orth.exer.us/1096   Copyright  VHI. All rights reserved.  Hip Flexion / Knee Extension: Straight-Leg Raise (Eccentric)    Lie on back. Lift leg with knee straight. Slowly lower leg for 3-5 seconds. _10__ reps per set, _2__ sets  Copyright  VHI. All rights reserved.  Functional Quadriceps: Sit to Stand    Sit on edge of chair, feet flat on floor. Stand upright, extending knees fully. Repeat _3___ times per set. Do __1__ sets per session. Do _1-2___ sessions per day.  http://orth.exer.us/734   Copyright  VHI. All rights reserved.

## 2016-10-16 ENCOUNTER — Ambulatory Visit: Payer: Medicare HMO | Admitting: Physical Therapy

## 2016-10-16 DIAGNOSIS — M6281 Muscle weakness (generalized): Secondary | ICD-10-CM | POA: Diagnosis not present

## 2016-10-16 DIAGNOSIS — R2681 Unsteadiness on feet: Secondary | ICD-10-CM

## 2016-10-16 DIAGNOSIS — R2689 Other abnormalities of gait and mobility: Secondary | ICD-10-CM | POA: Diagnosis not present

## 2016-10-17 NOTE — Therapy (Signed)
[facility] [address] [place], [place], [address] Phone: [phone]   Fax:  [phone]  Physical Therapy Treatment  Patient Details  Name: [name] MRN: [id number] Date of Birth: 1940-08-10 Referring Provider: Dr. Lavone Orn  Encounter Date: 10/16/2016      PT End of Session - 10/17/16 1119    Visit Number 5   Number of Visits 17   Date for PT Re-Evaluation 12/01/16   Authorization Type [employer] Medicare/[employer] HMO-GCODE every 10th visit   PT Start Time 1023  Pt arrived late   PT Stop Time 1101   PT Time Calculation (min) 38 min   Equipment Utilized During Treatment Gait belt   Activity Tolerance Patient tolerated treatment well   Behavior During Therapy WFL for tasks assessed/performed      Past Medical History:  Diagnosis Date  . Arthritis   . Atherosclerotic heart disease   . Back pain   . Bruises easily    takes Pletal daily and ASA 325mg  daily  . Cataract immature    right  . Chest pain   . Colon polyps   . Cyst    on perineum;takes Doxycycline daily  . Diabetes mellitus    takes Glipizide,Metformin,and Actos daily  . Diarrhea   . Edema    right leg knee down swollen since fall 3 wks ago  . GERD (gastroesophageal reflux disease)    Rolaids as needed-occ reflux  . Hemorrhoids   . History of kidney stones    at age 8   . Hyperlipidemia    takes Tricor and Lipitor daily  . Hyperlipidemia   . Hypertension    takes Altace,Amlodipine,and Nadolol daily  . Kidney stone    77+yrs ago  . Muscle pain   . Nasal polyps    hx of  . Peripheral edema    wears knee length hose-told by podiatrist to wear these  . Peripheral neuropathy   . Peripheral neuropathy    takes Gabapentin daily  . Pneumonia    as a child  . PVD (peripheral vascular disease) ([place])   . Renal insufficiency   . Seasonal allergies    takes Mucinex and Zyrtec prn  . Urinary frequency    urgency/takes  Vesicare daily    Past Surgical History:  Procedure Laterality Date  . Aortobifemoral bypass    . CARDIAC CATHETERIZATION  most recent 05/2011   total of 4  . CAROTID ANGIOGRAM N/A 06/05/2011   Procedure: CAROTID ANGIOGRAM;  Surgeon: Elam Dutch, MD;  Location: [facility] CATH LAB;  Service: Cardiovascular;  Laterality: N/A;  . CAROTID ENDARTERECTOMY  04/08/2011   left CEA  . cataract surgery     left  . COLONOSCOPY    . COLONOSCOPY WITH PROPOFOL N/A 08/22/2013   Procedure: COLONOSCOPY WITH PROPOFOL;  Surgeon: Garlan Fair, MD;  Location: [facility] ENDOSCOPY;  Service: Endoscopy;  Laterality: N/A;  . CORONARY ARTERY BYPASS GRAFT  09/09/2011   Procedure: CORONARY ARTERY BYPASS GRAFTING (CABG);  Surgeon: Gaye Pollack, MD;  Location: [facility];  Service: Open Heart Surgery;  Laterality: N/A;  Coronary artery bypass grafting  x three with Right saphenous vein harvested endoscopically and left internal mammary artery  . ENDARTERECTOMY  04/08/2011   Procedure: ENDARTERECTOMY CAROTID;  Surgeon: Elam Dutch, MD;  Location: [facility] OR;  Service: Vascular;  Laterality: Left;  with patch angioplasty  . ENDARTERECTOMY  09/09/2011   Procedure: ENDARTERECTOMY CAROTID;  Surgeon: Elam Dutch,  MD;  Location: [facility] OR;  Service: Vascular;  Laterality: Right;  with patch angioplasty  . KNEE ARTHROSCOPY  2011  . LEFT HEART CATHETERIZATION WITH CORONARY ANGIOGRAM N/A 07/09/2011   Procedure: LEFT HEART CATHETERIZATION WITH CORONARY ANGIOGRAM;  Surgeon: Sinclair Grooms, MD;  Location: [facility] CATH LAB;  Service: Cardiovascular;  Laterality: N/A;  . Reimplantation of inferior mesenteric artery    . Repair of infrarenal abdominal aortic aneurysm    . SHOULDER ARTHROSCOPY W/ ROTATOR CUFF REPAIR     right and left-open procedures  . TONSILLECTOMY     at age 64  . TRIGGER FINGER RELEASE Right 06/28/2014   Procedure: RIGHT LONG TRIGGER RELEASE ;  Surgeon: Leanora Cover, MD;  Location: [facility];  Service:  Orthopedics;  Laterality: Right;  . wisdom teeth extracted     as a teenager    There were no vitals filed for this visit.      Subjective Assessment - 10/16/16 1027    Subjective No falls.  Did not do the exercises since last visit.  I will try to do them at the counter, not in the shower.   Pertinent History arthritis, DM, HTN, peripheral neuropathy, PVD, CABG 2013, R RTC repair   Patient Stated Goals Pt's goal for therapy is to be able to walk wiithout cane, not to have to be so careful about walking.   Currently in Pain? Yes   Pain Score 3    Pain Location Chest   Pain Orientation Right   Pain Descriptors / Indicators Aching   Pain Type Acute pain   Pain Onset Today   Pain Frequency Intermittent   Aggravating Factors  deep breaths   Pain Relieving Factors better since I've been up and been moving     No increase in pain, no additional c/o discomfort reported during PT session.                    [facility] Adult PT Treatment/Exercise - 10/17/16 0001      Exercises   Exercises Knee/Hip     Knee/Hip Exercises: Supine   Bridges 10 reps  Review of HEP-pt return demo understanding   Bridges Limitations Cues for abdominal activation.   Straight Leg Raises Strengthening;Right;Left;1 set;10 reps  Review of HEP-pt return demo understanding   Other Supine Knee/Hip Exercises Functional sit<>stand x 10 reps-pt return demo understanding from HEP.  Cues provided for visual target to avoid looking at ground.             Balance Exercises - 10/17/16 1114      Balance Exercises: Standing   Standing Eyes Opened Narrow base of support (BOS);Head turns;Solid surface;5 reps  Head nods    Standing Eyes Closed Wide (BOA);Solid surface;5 reps  Head turns/nods/diagonals-review of HEP-pt return demo   Wall Bumps Hip   Wall Bumps-Hips Eyes opened;Anterior/posterior;10 reps  At counter   Stepping Strategy Anterior;Posterior;Lateral;UE support;10 reps  alternating legs    Partial Tandem Stance Eyes open;Intermittent upper extremity support;5 reps  Head turns/head nods   Heel Raises Limitations x 10   Toe Raise Limitations x 10  diffulty raising toes from floor   Other Standing Exercises Reviewed standing balance HEP and again asked if corner available in home (other than shower stall due to safety concerns)-pt reports he will try at counter with chair             PT Short Term Goals - 10/02/16 2042  PT SHORT TERM GOAL #1   Title Pt will be independent with HEP for improved balance, strength and gait.  TARGET 11/01/16   Time 4   Period Weeks   Status New     PT SHORT TERM GOAL #2   Title Pt will improve TUG score to less than or equal to 20 seconds for decreased fall risk.   Time 4   Period Weeks   Status New     PT SHORT TERM GOAL #3   Title Pt will improve Dynamic Gait Index score to at least 12/24 for decreased fall risk.   Time 4   Period Weeks   Status New     PT SHORT TERM GOAL #4   Title Sensory Organization test to be assessed, with goal to be written as appropriate.   Time 4   Period Weeks   Status New           PT Long Term Goals - 10/02/16 2043      PT LONG TERM GOAL #1   Title Pt will verbalize understanding of fall prevention in the home environment.  TARGET 12/01/16   Time 8   Period Weeks   Status New     PT LONG TERM GOAL #2   Title Pt will improve TUG score to less than or equal to 17 seconds for decreased fall risk.   Time 8   Period Weeks   Status New     PT LONG TERM GOAL #3   Title Pt will improve Dynamic Gait Index score to at least 17/24 for decreased fall risk.   Time 8   Period Weeks   Status New     PT LONG TERM GOAL #4   Title Pt will improve gait velocity to at least 2.62 ft/sec for improved gait efficiency and safety.   Time 8   Period Weeks   Status New     PT LONG TERM GOAL #5   Title Pt will verbalize plans for continued community fitness upon D/C from PT.   Time 8   Period  Weeks   Status New     Additional Long Term Goals   Additional Long Term Goals Yes     PT LONG TERM GOAL #6   Title Pt will improve FOTO status score by at least 10% to demo improved balance.   Time 8   Period Weeks   Status New               Plan - 10/17/16 1120    Clinical Impression Statement Pt performs HEP given last visit and needs minimal cues for technique.  Pt is challenged by narrowed BOS exercises for balance, needing UE support.  Pt demo ankle dorsiflexor weakness, by not being able to raise toes from floor in standing heel/toe raises.  May need to address in sitting or on compliant surfaces.   Rehab Potential Good   PT Frequency 2x / week   PT Duration 8 weeks  plus eval   PT Treatment/Interventions ADLs/Self Care Home Management;Functional mobility training;Gait training;Therapeutic activities;Therapeutic exercise;Balance training;Neuromuscular re-education;Patient/family education;Orthotic Fit/Training   PT Next Visit Plan Progress HEP to address balance and strength; vestibular training; LE strengthening-seated ankle dorsiflexor strengthening   Consulted and Agree with Plan of Care Patient      Patient will benefit from skilled therapeutic intervention in order to improve the following deficits and impairments:  Abnormal gait, Decreased balance, Decreased mobility, Decreased range of motion, Decreased strength, Impaired  flexibility, Difficulty walking  Visit Diagnosis: Unsteadiness on feet  Muscle weakness (generalized)     Problem List Patient Active Problem List   Diagnosis Date Noted  . Essential hypertension 02/06/2014  . Chronic diastolic heart failure ([place]) 02/06/2014  . Chronic kidney disease, stage III (moderate) 02/06/2014  . Aftercare following surgery of the circulatory system, [place] 01/25/2014  . Carotid stenosis 06/08/2013  . Stricture of artery ([place]) 04/14/2012  . Peripheral vascular disease, unspecified ([place]) 10/08/2011  . DVT of upper  extremity (deep vein thrombosis), left 09/29/2011  . S/P CABG (coronary artery bypass graft) 09/29/2011  . H/O carotid endarterectomy 09/29/2011  . Occlusion and stenosis of carotid artery without mention of cerebral infarction 06/11/2011  . Diabetes mellitus type 2 with complications ([place]) 42/68/3419    Kimberl Vig W. 10/17/2016, 11:22 AM Frazier Butt., PT  [facility] [address] [place], [place], [address] Phone: [phone]   Fax:  [phone]  Name: REAL CONA MRN: [id number] Date of Birth: April 05, 1941

## 2016-10-21 ENCOUNTER — Ambulatory Visit: Payer: Medicare HMO | Admitting: Physical Therapy

## 2016-10-21 DIAGNOSIS — M6281 Muscle weakness (generalized): Secondary | ICD-10-CM

## 2016-10-21 DIAGNOSIS — R2681 Unsteadiness on feet: Secondary | ICD-10-CM | POA: Diagnosis not present

## 2016-10-21 DIAGNOSIS — R2689 Other abnormalities of gait and mobility: Secondary | ICD-10-CM | POA: Diagnosis not present

## 2016-10-21 NOTE — Patient Instructions (Addendum)
KNEE: Extension, Long Arc Quads - Sitting    Raise leg until knee is straight.  HOld for 3 seconds _10 reps per set, _1-2__ sets per day  Copyright  VHI. All rights reserved.  Toe Up    Gently rise up on toes (hold for 3 seconds) and then rock back on heels (hold for 3 seconds) Repeat ___10-20_ times. Do _1-2___ sessions per day.  http://gt2.exer.us/455   Copyright  VHI. All rights reserved.  Knee Raise    Lift knee and then lower it. Repeat with other knee.  Alternate legs, like marching Repeat __10__ times. Do ___1-2_ sessions per day.  http://gt2.exer.us/445   Copyright  VHI. All rights reserved.

## 2016-10-22 NOTE — Therapy (Signed)
[facility] [address] [place], [place], [address] Phone: [phone]   Fax:  [phone]  Physical Therapy Treatment  Patient Details  Name: [name] MRN: [id number] Date of Birth: 11-04-1940 Referring Provider: Dr. Lavone Orn  Encounter Date: 10/21/2016      PT End of Session - 10/22/16 1659    Visit Number 6   Number of Visits 17   Date for PT Re-Evaluation 12/01/16   Authorization Type [employer] Medicare/[employer] Medicare HMO-GCODE every 10th visit   PT Start Time 1104   PT Stop Time 1150   PT Time Calculation (min) 46 min   Equipment Utilized During Treatment Gait belt   Activity Tolerance Patient tolerated treatment well   Behavior During Therapy [facility] for tasks assessed/performed      Past Medical History:  Diagnosis Date  . Arthritis   . Atherosclerotic heart disease   . Back pain   . Bruises easily    takes Pletal daily and ASA 325mg  daily  . Cataract immature    right  . Chest pain   . Colon polyps   . Cyst    on perineum;takes Doxycycline daily  . Diabetes mellitus    takes Glipizide,Metformin,and Actos daily  . Diarrhea   . Edema    right leg knee down swollen since fall 3 wks ago  . GERD (gastroesophageal reflux disease)    Rolaids as needed-occ reflux  . Hemorrhoids   . History of kidney stones    at age 33   . Hyperlipidemia    takes Tricor and Lipitor daily  . Hyperlipidemia   . Hypertension    takes Altace,Amlodipine,and Nadolol daily  . Kidney stone    35+yrs ago  . Muscle pain   . Nasal polyps    hx of  . Peripheral edema    wears knee length hose-told by podiatrist to wear these  . Peripheral neuropathy   . Peripheral neuropathy    takes Gabapentin daily  . Pneumonia    as a child  . PVD (peripheral vascular disease) ([place])   . Renal insufficiency   . Seasonal allergies    takes Mucinex and Zyrtec prn  . Urinary frequency    urgency/takes Vesicare daily     Past Surgical History:  Procedure Laterality Date  . Aortobifemoral bypass    . CARDIAC CATHETERIZATION  most recent 05/2011   total of 4  . CAROTID ANGIOGRAM N/A 06/05/2011   Procedure: CAROTID ANGIOGRAM;  Surgeon: Elam Dutch, MD;  Location: [facility] CATH LAB;  Service: Cardiovascular;  Laterality: N/A;  . CAROTID ENDARTERECTOMY  04/08/2011   left CEA  . cataract surgery     left  . COLONOSCOPY    . COLONOSCOPY WITH PROPOFOL N/A 08/22/2013   Procedure: COLONOSCOPY WITH PROPOFOL;  Surgeon: Garlan Fair, MD;  Location: [facility] ENDOSCOPY;  Service: Endoscopy;  Laterality: N/A;  . CORONARY ARTERY BYPASS GRAFT  09/09/2011   Procedure: CORONARY ARTERY BYPASS GRAFTING (CABG);  Surgeon: Gaye Pollack, MD;  Location: [facility];  Service: Open Heart Surgery;  Laterality: N/A;  Coronary artery bypass grafting  x three with Right saphenous vein harvested endoscopically and left internal mammary artery  . ENDARTERECTOMY  04/08/2011   Procedure: ENDARTERECTOMY CAROTID;  Surgeon: Elam Dutch, MD;  Location: [facility] OR;  Service: Vascular;  Laterality: Left;  with patch angioplasty  . ENDARTERECTOMY  09/09/2011   Procedure: ENDARTERECTOMY CAROTID;  Surgeon: Elam Dutch, MD;  Location[facility]  OR;  Service: Vascular;  Laterality: Right;  with patch angioplasty  . KNEE ARTHROSCOPY  2011  . LEFT HEART CATHETERIZATION WITH CORONARY ANGIOGRAM N/A 07/09/2011   Procedure: LEFT HEART CATHETERIZATION WITH CORONARY ANGIOGRAM;  Surgeon: Sinclair Grooms, MD;  Location: [facility] CATH LAB;  Service: Cardiovascular;  Laterality: N/A;  . Reimplantation of inferior mesenteric artery    . Repair of infrarenal abdominal aortic aneurysm    . SHOULDER ARTHROSCOPY W/ ROTATOR CUFF REPAIR     right and left-open procedures  . TONSILLECTOMY     at age 66  . TRIGGER FINGER RELEASE Right 06/28/2014   Procedure: RIGHT LONG TRIGGER RELEASE ;  Surgeon: Leanora Cover, MD;  Location: [facility];  Service: Orthopedics;  Laterality:  Right;  . wisdom teeth extracted     as a teenager    There were no vitals filed for this visit.      Subjective Assessment - 10/21/16 1106    Subjective No stumbles, no falls; have some questions about the sit<>stand because I feel like I may fall forward.  Didn't do Nustep over the holiday weekend.   Pertinent History arthritis, DM, HTN, peripheral neuropathy, PVD, CABG 2013, R RTC repair   Patient Stated Goals Pt's goal for therapy is to be able to walk wiithout cane, not to have to be so careful about walking.   Currently in Pain? Yes   Pain Score 1    Pain Location --  R upper trunk/chest   Pain Orientation Right   Pain Descriptors / Indicators Aching   Pain Type Acute pain   Pain Onset Today   Pain Frequency Intermittent   Aggravating Factors  deep breaths   Pain Relieving Factors better today since I've been up and moving                         [facility] Adult PT Treatment/Exercise - 10/21/16 1119      Transfers   Transfers Sit to Stand;Stand to Sit   Sit to Stand 6: Modified independent (Device/Increase time)   Stand to Sit 6: Modified independent (Device/Increase time);With armrests;To chair/3-in-1   Number of Reps 10 reps  + multiple other reps with varied cues based on pt questions   Comments Discussed practical tips for sit<>stand, as functional lower extremity strengthening and for optimal safety and technqiue for sit<>stand transfers from varied chairs and surfaces at home.     Exercises   Exercises Knee/Hip;Ankle     Knee/Hip Exercises: Standing   Forward Step Up Right;Left;1 set;10 reps;Hand Hold: 2;Step Height: 4"  in parallel bars   Other Standing Knee Exercises Alternating step taps to 4" block, x 10 reps, to encourage increased hip flexion (versus circumduction/external rotation to lift leg to step); stagger stance forward and back weightshifting x 10 reps to encourage dynamic ankle dorsflexion (more unweighted than in full standing)      Knee/Hip Exercises: Seated   Long Arc Quad AROM;Strengthening;Right;Left;1 set;10 reps  3 second hold   Marching Strengthening;Right;Left;1 set;10 reps     Ankle Exercises: Seated   Heel Raises 10 reps;3 seconds  2 sets   Toe Raise 10 reps;3 seconds                PT Education - 10/22/16 1659    Education provided Yes   Education Details Additions to [employer] instructions   Person(s) Educated Patient   Methods Explanation;Demonstration;Handout   Comprehension Verbalized understanding;Returned demonstration;Verbal cues  required;Need further instruction          PT Short Term Goals - 10/02/16 2042      PT SHORT TERM GOAL #1   Title Pt will be independent with HEP for improved balance, strength and gait.  TARGET 11/01/16   Time 4   Period Weeks   Status New     PT SHORT TERM GOAL #2   Title Pt will improve TUG score to less than or equal to 20 seconds for decreased fall risk.   Time 4   Period Weeks   Status New     PT SHORT TERM GOAL #3   Title Pt will improve Dynamic Gait Index score to at least 12/24 for decreased fall risk.   Time 4   Period Weeks   Status New     PT SHORT TERM GOAL #4   Title Sensory Organization test to be assessed, with goal to be written as appropriate.   Time 4   Period Weeks   Status New           PT Long Term Goals - 10/02/16 2043      PT LONG TERM GOAL #1   Title Pt will verbalize understanding of fall prevention in the home environment.  TARGET 12/01/16   Time 8   Period Weeks   Status New     PT LONG TERM GOAL #2   Title Pt will improve TUG score to less than or equal to 17 seconds for decreased fall risk.   Time 8   Period Weeks   Status New     PT LONG TERM GOAL #3   Title Pt will improve Dynamic Gait Index score to at least 17/24 for decreased fall risk.   Time 8   Period Weeks   Status New     PT LONG TERM GOAL #4   Title Pt will improve gait velocity to at least 2.62 ft/sec for improved gait efficiency  and safety.   Time 8   Period Weeks   Status New     PT LONG TERM GOAL #5   Title Pt will verbalize plans for continued community fitness upon D/C from PT.   Time 8   Period Weeks   Status New     Additional Long Term Goals   Additional Long Term Goals Yes     PT LONG TERM GOAL #6   Title Pt will improve FOTO status score by at least 10% to demo improved balance.   Time 8   Period Weeks   Status New               Plan - 10/22/16 1700    Clinical Impression Statement Skilled PT session performed today to adress lower extremity strengthening.  Pt demo decreased hip flexor strength with seated marching and with standing step taps and step negotiation (per pt report).  Pt requested and PT provided clarification on sit<>stand exercises and proper technique with sit to stand in varied situations.  Pt will continue to benefit from further skilled PT to address balance, strength, and gait.     Rehab Potential Good   PT Frequency 2x / week   PT Duration 8 weeks  plus eval   PT Treatment/Interventions ADLs/Self Care Home Management;Functional mobility training;Gait training;Therapeutic activities;Therapeutic exercise;Balance training;Neuromuscular re-education;Patient/family education;Orthotic Fit/Training   PT Next Visit Plan Review HEP provided this visit; continue to work on hip flexor, quad and ankle dorsiflexor strengthening as well as balance.  Consulted and Agree with Plan of Care Patient      Patient will benefit from skilled therapeutic intervention in order to improve the following deficits and impairments:  Abnormal gait, Decreased balance, Decreased mobility, Decreased range of motion, Decreased strength, Impaired flexibility, Difficulty walking  Visit Diagnosis: Muscle weakness (generalized)  Unsteadiness on feet     Problem List Patient Active Problem List   Diagnosis Date Noted  . Essential hypertension 02/06/2014  . Chronic diastolic heart failure ([place])  02/06/2014  . Chronic kidney disease, stage III (moderate) 02/06/2014  . Aftercare following surgery of the circulatory system, [place] 01/25/2014  . Carotid stenosis 06/08/2013  . Stricture of artery ([place]) 04/14/2012  . Peripheral vascular disease, unspecified ([place]) 10/08/2011  . DVT of upper extremity (deep vein thrombosis), left 09/29/2011  . S/P CABG (coronary artery bypass graft) 09/29/2011  . H/O carotid endarterectomy 09/29/2011  . Occlusion and stenosis of carotid artery without mention of cerebral infarction 06/11/2011  . Diabetes mellitus type 2 with complications ([place]) 01/75/1025    Jolonda Gomm W. 10/22/2016, 5:08 PM  Frazier Butt., PT  [facility] [address] [place], [place], [address] Phone: [phone]   Fax:  [phone]  Name: [name] MRN: [id number] Date of Birth: June 28, 1940

## 2016-10-23 ENCOUNTER — Ambulatory Visit: Payer: Medicare HMO | Attending: Internal Medicine | Admitting: Physical Therapy

## 2016-10-23 DIAGNOSIS — R2689 Other abnormalities of gait and mobility: Secondary | ICD-10-CM | POA: Insufficient documentation

## 2016-10-23 DIAGNOSIS — M6281 Muscle weakness (generalized): Secondary | ICD-10-CM | POA: Insufficient documentation

## 2016-10-23 DIAGNOSIS — R2681 Unsteadiness on feet: Secondary | ICD-10-CM | POA: Diagnosis not present

## 2016-10-23 NOTE — Therapy (Signed)
[facility] [address] [place], [place], [address] Phone: [phone]   Fax:  [phone]  Physical Therapy Treatment  Patient Details  Name: [name] MRN: [id number] Date of Birth: Nov 24, 1940 Referring Provider: Dr. Lavone Orn  Encounter Date: 10/23/2016      PT End of Session - 10/23/16 1205    Visit Number 7   Number of Visits 17   Date for PT Re-Evaluation 12/01/16   Authorization Type [employer] Medicare/[employer] Medicare HMO-GCODE every 10th visit   PT Start Time 1112  Pt arrived late, and then was in bathroom from 1146-1158   PT Stop Time 1205  41 min of treatment time   PT Time Calculation (min) 53 min   Equipment Utilized During Treatment Gait belt   Activity Tolerance Patient tolerated treatment well   Behavior During Therapy [facility] for tasks assessed/performed      Past Medical History:  Diagnosis Date  . Arthritis   . Atherosclerotic heart disease   . Back pain   . Bruises easily    takes Pletal daily and ASA 325mg  daily  . Cataract immature    right  . Chest pain   . Colon polyps   . Cyst    on perineum;takes Doxycycline daily  . Diabetes mellitus    takes Glipizide,Metformin,and Actos daily  . Diarrhea   . Edema    right leg knee down swollen since fall 3 wks ago  . GERD (gastroesophageal reflux disease)    Rolaids as needed-occ reflux  . Hemorrhoids   . History of kidney stones    at age 2   . Hyperlipidemia    takes Tricor and Lipitor daily  . Hyperlipidemia   . Hypertension    takes Altace,Amlodipine,and Nadolol daily  . Kidney stone    35+yrs ago  . Muscle pain   . Nasal polyps    hx of  . Peripheral edema    wears knee length hose-told by podiatrist to wear these  . Peripheral neuropathy   . Peripheral neuropathy    takes Gabapentin daily  . Pneumonia    as a child  . PVD (peripheral vascular disease) ([place])   . Renal insufficiency   . Seasonal allergies    takes  Mucinex and Zyrtec prn  . Urinary frequency    urgency/takes Vesicare daily    Past Surgical History:  Procedure Laterality Date  . Aortobifemoral bypass    . CARDIAC CATHETERIZATION  most recent 05/2011   total of 4  . CAROTID ANGIOGRAM N/A 06/05/2011   Procedure: CAROTID ANGIOGRAM;  Surgeon: Elam Dutch, MD;  Location: [facility] CATH LAB;  Service: Cardiovascular;  Laterality: N/A;  . CAROTID ENDARTERECTOMY  04/08/2011   left CEA  . cataract surgery     left  . COLONOSCOPY    . COLONOSCOPY WITH PROPOFOL N/A 08/22/2013   Procedure: COLONOSCOPY WITH PROPOFOL;  Surgeon: Garlan Fair, MD;  Location: [facility] ENDOSCOPY;  Service: Endoscopy;  Laterality: N/A;  . CORONARY ARTERY BYPASS GRAFT  09/09/2011   Procedure: CORONARY ARTERY BYPASS GRAFTING (CABG);  Surgeon: Gaye Pollack, MD;  Location: [facility];  Service: Open Heart Surgery;  Laterality: N/A;  Coronary artery bypass grafting  x three with Right saphenous vein harvested endoscopically and left internal mammary artery  . ENDARTERECTOMY  04/08/2011   Procedure: ENDARTERECTOMY CAROTID;  Surgeon: Elam Dutch, MD;  Location: [facility] OR;  Service: Vascular;  Laterality: Left;  with patch angioplasty  .  ENDARTERECTOMY  09/09/2011   Procedure: ENDARTERECTOMY CAROTID;  Surgeon: Elam Dutch, MD;  Location: [facility] OR;  Service: Vascular;  Laterality: Right;  with patch angioplasty  . KNEE ARTHROSCOPY  2011  . LEFT HEART CATHETERIZATION WITH CORONARY ANGIOGRAM N/A 07/09/2011   Procedure: LEFT HEART CATHETERIZATION WITH CORONARY ANGIOGRAM;  Surgeon: Sinclair Grooms, MD;  Location: [facility] CATH LAB;  Service: Cardiovascular;  Laterality: N/A;  . Reimplantation of inferior mesenteric artery    . Repair of infrarenal abdominal aortic aneurysm    . SHOULDER ARTHROSCOPY W/ ROTATOR CUFF REPAIR     right and left-open procedures  . TONSILLECTOMY     at age 76  . TRIGGER FINGER RELEASE Right 06/28/2014   Procedure: RIGHT LONG TRIGGER RELEASE ;  Surgeon: Leanora Cover,  MD;  Location: [facility];  Service: Orthopedics;  Laterality: Right;  . wisdom teeth extracted     as a teenager    There were no vitals filed for this visit.      Subjective Assessment - 10/23/16 1114    Subjective No changes since last visit.  Late today due to car trouble.   Pertinent History arthritis, DM, HTN, peripheral neuropathy, PVD, CABG 2013, R RTC repair   Patient Stated Goals Pt's goal for therapy is to be able to walk wiithout cane, not to have to be so careful about walking.   Currently in Pain? No/denies   Pain Onset Today                         [facility] Adult PT Treatment/Exercise - 10/23/16 0001      Self-Care   Self-Care Other Self-Care Comments   Other Self-Care Comments  Discussed optimal way to negotiate deck step at home.  Pt has reported increased fear in the last few PT sessions regarding trying to negoitate the step onto and off of his deck.  Today, he reports with his exercises, he feels more confident, less fear.  Attempted to negotiate single 4" step replicating curb step, with PT providing instructions to bring cane onto curb step then step up with good leg, down with weaker leg.  PT provided cues to place cane on ground to descend, then step down with weaker leg and then strong leg.  Pt demo the way he feels most comfortable, which is leaving cane on ground, ascending with L>R LE, then stepping down leaving cane on step.  AFter pt's restroom break, practiced several times with cues for optimal cane placement and sequence.     Exercises   Exercises Knee/Hip;Ankle;Other Exercises   Other Exercises  Reviewed HEP given last visit:  Marching in place x 10 reps (needs cues due to fatigue), LAQ x 10 reps, seated heel raises x 10, then toe raises x 10 reps-pt return demo understanding with min verbal cues.     Knee/Hip Exercises: Standing   Forward Step Up Right;Left;1 set;10 reps;Hand Hold: 2;Step Height: 4"   Other Standing Knee  Exercises Alternating step taps to 4" block x 12 reps each side, with cues to increase foot clearance on step, then forward stepping over obstacle x 12 reps for improved step length and foot clearance, with bilateral UE support.                PT Education - 10/23/16 1204    Education provided Yes   Education Details Curb step negotiation for single deck step   Person(s) Educated Patient  Methods Explanation;Demonstration;Verbal cues   Comprehension Verbalized understanding;Returned demonstration;Verbal cues required;Need further instruction          PT Short Term Goals - 10/02/16 2042      PT SHORT TERM GOAL #1   Title Pt will be independent with HEP for improved balance, strength and gait.  TARGET 11/01/16   Time 4   Period Weeks   Status New     PT SHORT TERM GOAL #2   Title Pt will improve TUG score to less than or equal to 20 seconds for decreased fall risk.   Time 4   Period Weeks   Status New     PT SHORT TERM GOAL #3   Title Pt will improve Dynamic Gait Index score to at least 12/24 for decreased fall risk.   Time 4   Period Weeks   Status New     PT SHORT TERM GOAL #4   Title Sensory Organization test to be assessed, with goal to be written as appropriate.   Time 4   Period Weeks   Status New           PT Long Term Goals - 10/02/16 2043      PT LONG TERM GOAL #1   Title Pt will verbalize understanding of fall prevention in the home environment.  TARGET 12/01/16   Time 8   Period Weeks   Status New     PT LONG TERM GOAL #2   Title Pt will improve TUG score to less than or equal to 17 seconds for decreased fall risk.   Time 8   Period Weeks   Status New     PT LONG TERM GOAL #3   Title Pt will improve Dynamic Gait Index score to at least 17/24 for decreased fall risk.   Time 8   Period Weeks   Status New     PT LONG TERM GOAL #4   Title Pt will improve gait velocity to at least 2.62 ft/sec for improved gait efficiency and safety.    Time 8   Period Weeks   Status New     PT LONG TERM GOAL #5   Title Pt will verbalize plans for continued community fitness upon D/C from PT.   Time 8   Period Weeks   Status New     Additional Long Term Goals   Additional Long Term Goals Yes     PT LONG TERM GOAL #6   Title Pt will improve FOTO status score by at least 10% to demo improved balance.   Time 8   Period Weeks   Status New               Plan - 10/23/16 1206    Clinical Impression Statement Skilled PT session performed today to review HEP, continue to address lower extremity strengthening.  Also discussed/practiced sequence of step negotiation for deck step.  Pt is fearful/somewhat resistant to changing the way he performs single step negotiation.  Will continue to benefit from further skilled PT to address strength, balance, and gait.     Rehab Potential Good   PT Frequency 2x / week   PT Duration 8 weeks  plus eval   PT Treatment/Interventions ADLs/Self Care Home Management;Functional mobility training;Gait training;Therapeutic activities;Therapeutic exercise;Balance training;Neuromuscular re-education;Patient/family education;Orthotic Fit/Training   PT Next Visit Plan Begin checking LTGs and plan for either discharge or renew.   Consulted and Agree with Plan of Care Patient  Patient will benefit from skilled therapeutic intervention in order to improve the following deficits and impairments:  Abnormal gait, Decreased balance, Decreased mobility, Decreased range of motion, Decreased strength, Impaired flexibility, Difficulty walking  Visit Diagnosis: Muscle weakness (generalized)  Unsteadiness on feet     Problem List Patient Active Problem List   Diagnosis Date Noted  . Essential hypertension 02/06/2014  . Chronic diastolic heart failure ([place]) 02/06/2014  . Chronic kidney disease, stage III (moderate) 02/06/2014  . Aftercare following surgery of the circulatory system, [place] 01/25/2014  .  Carotid stenosis 06/08/2013  . Stricture of artery ([place]) 04/14/2012  . Peripheral vascular disease, unspecified ([place]) 10/08/2011  . DVT of upper extremity (deep vein thrombosis), left 09/29/2011  . S/P CABG (coronary artery bypass graft) 09/29/2011  . H/O carotid endarterectomy 09/29/2011  . Occlusion and stenosis of carotid artery without mention of cerebral infarction 06/11/2011  . Diabetes mellitus type 2 with complications ([place]) 81/82/9937    MARRIOTT,AMY W. 10/23/2016, 12:09 PM [facility] [address] [place], [place], [address] Phone: [phone]   Fax:  [phone]  Name: ADYAN PALAU MRN: [id number] Date of Birth: 12-01-1940

## 2016-10-27 ENCOUNTER — Ambulatory Visit: Payer: Medicare HMO | Admitting: Physical Therapy

## 2016-10-27 DIAGNOSIS — R2681 Unsteadiness on feet: Secondary | ICD-10-CM | POA: Diagnosis not present

## 2016-10-27 DIAGNOSIS — R2689 Other abnormalities of gait and mobility: Secondary | ICD-10-CM | POA: Diagnosis not present

## 2016-10-27 DIAGNOSIS — M6281 Muscle weakness (generalized): Secondary | ICD-10-CM | POA: Diagnosis not present

## 2016-10-27 NOTE — Therapy (Signed)
[place] [address] [place], [place], [address] Phone: [phone]   Fax:  [phone]  Physical Therapy Treatment  Patient Details  Name: [name] MRN: [id number] Date of Birth: 03/18/1941 Referring Provider: Dr. Lavone Orn  Encounter Date: 10/27/2016      PT End of Session - 10/27/16 1606    Visit Number 8   Number of Visits 17   Date for PT Re-Evaluation 12/01/16   Authorization Type [employer] Medicare/[employer] Medicare HMO-GCODE every 10th visit   PT Start Time 1150   PT Stop Time 1236   PT Time Calculation (min) 46 min   Activity Tolerance Patient tolerated treatment well   Behavior During Therapy [facility] for tasks assessed/performed      Past Medical History:  Diagnosis Date  . Arthritis   . Atherosclerotic heart disease   . Back pain   . Bruises easily    takes Pletal daily and ASA 367m daily  . Cataract immature    right  . Chest pain   . Colon polyps   . Cyst    on perineum;takes Doxycycline daily  . Diabetes mellitus    takes Glipizide,Metformin,and Actos daily  . Diarrhea   . Edema    right leg knee down swollen since fall 3 wks ago  . GERD (gastroesophageal reflux disease)    Rolaids as needed-occ reflux  . Hemorrhoids   . History of kidney stones    at age 76  . Hyperlipidemia    takes Tricor and Lipitor daily  . Hyperlipidemia   . Hypertension    takes Altace,Amlodipine,and Nadolol daily  . Kidney stone    35+yrs ago  . Muscle pain   . Nasal polyps    hx of  . Peripheral edema    wears knee length hose-told by podiatrist to wear these  . Peripheral neuropathy   . Peripheral neuropathy    takes Gabapentin daily  . Pneumonia    as a child  . PVD ([place]ipheral vascular disease) ([place]   . Renal insufficiency   . Seasonal allergies    takes Mucinex and Zyrtec prn  . Urinary frequency    urgency/takes Vesicare daily    Past Surgical History:  Procedure Laterality Date   . Aortobifemoral bypass    . CARDIAC CATHETERIZATION  most recent 05/2011   total of 4  . CAROTID ANGIOGRAM N/A 06/05/2011   Procedure: CAROTID ANGIOGRAM;  Surgeon: CElam Dutch MD;  [facility];  Service: Cardiovascular;  Laterality: N/A;  . CAROTID ENDARTERECTOMY  04/08/2011   left CEA  . cataract surgery     left  . COLONOSCOPY    . COLONOSCOPY WITH PROPOFOL N/A 08/22/2013   Procedure: COLONOSCOPY WITH PROPOFOL;  Surgeon: MGarlan Fair MD;  [facility] ENDOSCOPY;  Service: Endoscopy;  Laterality: N/A;  . CORONARY ARTERY BYPASS GRAFT  09/09/2011   Procedure: CORONARY ARTERY BYPASS GRAFTING (CABG);  Surgeon: BGaye Pollack MD;  [place] [facility]  Service: Open Heart Surgery;  Laterality: N/A;  Coronary artery bypass grafting  x three with Right saphenous vein harvested endoscopically and left internal mammary artery  . ENDARTERECTOMY  04/08/2011   Procedure: ENDARTERECTOMY CAROTID;  Surgeon: CElam Dutch MD;  [facility];  Service: Vascular;  Laterality: Left;  with patch angioplasty  . ENDARTERECTOMY  09/09/2011   Procedure: ENDARTERECTOMY CAROTID;  Surgeon: CElam Dutch MD;  [place] [facility]  Service: Vascular;  Laterality: Right;  with patch angioplasty  . KNEE ARTHROSCOPY  2011  . LEFT HEART CATHETERIZATION WITH CORONARY ANGIOGRAM N/A 07/09/2011   Procedure: LEFT HEART CATHETERIZATION WITH CORONARY ANGIOGRAM;  Surgeon: Sinclair Grooms, MD;  Location: [facility] CATH LAB;  Service: Cardiovascular;  Laterality: N/A;  . Reimplantation of inferior mesenteric artery    . Repair of infrarenal abdominal aortic aneurysm    . SHOULDER ARTHROSCOPY W/ ROTATOR CUFF REPAIR     right and left-open procedures  . TONSILLECTOMY     at age 5  . TRIGGER FINGER RELEASE Right 06/28/2014   Procedure: RIGHT LONG TRIGGER RELEASE ;  Surgeon: Leanora Cover, MD;  Location: [facility];  Service: Orthopedics;  Laterality: Right;  . wisdom teeth extracted     as a teenager     There were no vitals filed for this visit.      Subjective Assessment - 10/27/16 1154    Subjective No changes since last visit; no falls.   Pertinent History arthritis, DM, HTN, peripheral neuropathy, PVD, CABG 2013, R RTC repair   Patient Stated Goals Pt's goal for therapy is to be able to walk wiithout cane, not to have to be so careful about walking.   Currently in Pain? No/denies   Pain Onset Today                         [facility] Adult PT Treatment/Exercise - 10/27/16 1159      Standardized Balance Assessment   Standardized Balance Assessment Timed Up and Go Test;Dynamic Gait Index     Dynamic Gait Index   Level Surface Moderate Impairment   Change in Gait Speed Mild Impairment   Gait with Horizontal Head Turns Mild Impairment   Gait with Vertical Head Turns Mild Impairment   Gait and Pivot Turn Mild Impairment   Step Over Obstacle Mild Impairment   Step Around Obstacles Mild Impairment   Steps Mild Impairment   Total Score 15     Timed Up and Go Test   TUG Normal TUG   Normal TUG (seconds) 19.96     Self-Care   Self-Care Other Self-Care Comments   Other Self-Care Comments  Discussed results of objective measures and STGs.  Discussed that while pt is making progress, he is still at risk of falls per DGI and TUG scores.  Discussed that while he is improving vestibular system use for balance, vestibular system use is still lower than WNL.  Discussed need to consistently perform HEP given to address vestibular system deficits.  Discussed benefits of continuing therapy to continue to address fall risk.  Pt in agreement to  continue therapy, but he is concerned about co-pay and agrees to 1x/wk for 2 additional weeks.         Neuro re-ed: sensory organization test performed with following results: Conditions: 1: WFL  2: WFL 3: WFL  4: WFL 5: Fall trial 1 and 2, WFL trial 3 6: WFL trial 1 and 2, fall trial 3 Composite score: 64 (improved from  46) Sensory Analysis Som: WFL Vis: WFL Vest: lower than WFL (25/100) (compared to less than 5/100 at initial check) Pref: [facility]                    PT Education - 10/27/16 1605    Education provided Yes   Education Details Results of DGI, TUG and Sensory Organization test, discussed POC   Person(s) Educated Patient   Methods Explanation;Handout  Comprehension Verbalized understanding          PT Short Term Goals - 10/27/16 1155      PT SHORT TERM GOAL #1   Title Pt will be independent with HEP for improved balance, strength and gait.  TARGET 11/01/16   Time 4   Period Weeks   Status Achieved     PT SHORT TERM GOAL #2   Title Pt will improve TUG score to less than or equal to 20 seconds for decreased fall risk.   Baseline 19.96 sec   Time 4   Period Weeks   Status Achieved     PT SHORT TERM GOAL #3   Title Pt will improve Dynamic Gait Index score to at least 12/24 for decreased fall risk.   Baseline 15/24  10/27/16   Time 4   Period Weeks   Status Achieved     PT SHORT TERM GOAL #4   Title Pt will improve composite score on Sensory Organization test by at least 10% for improved overall balance. (Per SOT report on 10/07/16-composite score 46/100)   Baseline Composite score 10/27/16:  64 (WNL)   Time 4   Period Weeks   Status Achieved           PT Long Term Goals - 10/27/16 1154      PT LONG TERM GOAL #1   Title Pt will verbalize understanding of fall prevention in the home environment.  TARGET 12/01/16   Time 8   Period Weeks   Status On-going     PT LONG TERM GOAL #2   Title Pt will improve TUG score to less than or equal to 17 seconds for decreased fall risk.   Time 8   Period Weeks   Status On-going     PT LONG TERM GOAL #3   Title Pt will improve Dynamic Gait Index score to at least 17/24 for decreased fall risk.   Time 8   Period Weeks   Status On-going     PT LONG TERM GOAL #4   Title Pt will improve gait velocity to at least 2.62 ft/sec for  improved gait efficiency and safety.   Time 8   Period Weeks   Status On-going     PT LONG TERM GOAL #5   Title Pt will verbalize plans for continued community fitness upon D/C from PT.   Time 8   Period Weeks   Status On-going     PT LONG TERM GOAL #6   Title Pt will improve FOTO status score by at least 10% to demo improved balance.   Time 8   Period Weeks   Status On-going               Plan - 10/27/16 1606    Clinical Impression Statement Pt has met all STGs, and has demonstrated progress with TUG, DGI and Sensory Organization test scores.  He remains at fall risk per TUG and per DGI scores.  His composite SOT score is WNL, but vestibular system has improved (still lower than WNL).  Pt will continue to benefit from further skilled PT to address balance, strength and gait.   Rehab Potential Good   PT Frequency 2x / week   PT Duration 8 weeks  plus eval   PT Treatment/Interventions ADLs/Self Care Home Management;Functional mobility training;Gait training;Therapeutic activities;Therapeutic exercise;Balance training;Neuromuscular re-education;Patient/family education;Orthotic Fit/Training   PT Next Visit Plan Pt would like to continue PT fro 1x/wk 2 additional weeks-plan to review  and progress vestibular/balance HEP; fall prevention education and work towards [employer] and Agree with Plan of Care Patient      Patient will benefit from skilled therapeutic intervention in order to improve the following deficits and impairments:  Abnormal gait, Decreased balance, Decreased mobility, Decreased range of motion, Decreased strength, Impaired flexibility, Difficulty walking  Visit Diagnosis: Unsteadiness on feet  Other abnormalities of gait and mobility     Problem List Patient Active Problem List   Diagnosis Date Noted  . Essential hypertension 02/06/2014  . Chronic diastolic heart failure ([place]) 02/06/2014  . Chronic kidney disease, stage III (moderate) 02/06/2014   . Aftercare following surgery of the circulatory system, [place] 01/25/2014  . Carotid stenosis 06/08/2013  . Stricture of artery ([place]) 04/14/2012  . Peripheral vascular disease, unspecified ([place]) 10/08/2011  . DVT of upper extremity (deep vein thrombosis), left 09/29/2011  . S/P CABG (coronary artery bypass graft) 09/29/2011  . H/O carotid endarterectomy 09/29/2011  . Occlusion and stenosis of carotid artery without mention of cerebral infarction 06/11/2011  . Diabetes mellitus type 2 with complications ([place]) 71/21/9758    Ariez Neilan W. 10/27/2016, 4:10 PM  Frazier Butt., PT   [facility] [address] [place], [place], [address] Phone: [phone]   Fax:  [phone]  Name: [name] MRN: [id number] Date of Birth: [birth year]

## 2016-10-29 ENCOUNTER — Encounter: Payer: Self-pay | Admitting: Physical Therapy

## 2016-10-29 ENCOUNTER — Ambulatory Visit: Payer: Medicare HMO | Admitting: Physical Therapy

## 2016-10-29 DIAGNOSIS — M6281 Muscle weakness (generalized): Secondary | ICD-10-CM | POA: Diagnosis not present

## 2016-10-29 DIAGNOSIS — R2689 Other abnormalities of gait and mobility: Secondary | ICD-10-CM

## 2016-10-29 DIAGNOSIS — R2681 Unsteadiness on feet: Secondary | ICD-10-CM

## 2016-10-29 NOTE — Patient Instructions (Addendum)
Perform these in a safe environment such as next to bed/counter top with chair in front of you for safety: Feet Together, Varied Arm Positions - Eyes Closed    Stand with feet together and arms at sides. Close eyes and visualize upright position. Hold _30___ seconds. Repeat ___2-3_ times per session. Do __1-2__ sessions per day.  Copyright  VHI. All rights reserved.    Feet Together, Head Motion - Eyes Closed    With eyes closed and feet together, move head slowly: 1. Up and down x 10 reps 2. Left and right x 10 reps Do __1-2__ sessions per day.  Copyright  VHI. All rights reserved.   Feet Apart (Compliant Surface) Varied Arm Positions - Eyes Closed    Stand on compliant surface: _pillow/s_ with feet shoulder width apart and arms at sides. Close eyes and visualize upright position. Hold__30__ seconds. Repeat __2-3_ times per session. Do __1-2__ sessions per day.  Copyright  VHI. All rights reserved.  Feet Apart (Compliant Surface) Head Motion - Eyes Closed    Stand on compliant surface: _pillow/s_ with feet shoulder width apart. Close eyes and move head slowly: 1. Up and down x 10 reps 2. Left and right x 10 reps Do _1-2___ sessions per day.  Copyright  VHI. All rights reserved.

## 2016-10-30 NOTE — Therapy (Signed)
[facility] [address] [place], [place], [address] Phone: [phone]   Fax:  [phone]  Physical Therapy Treatment  Patient Details  Name: [name] MRN: [id number] Date of Birth: March 14, 1941 Referring Provider: Dr. Lavone Orn  Encounter Date: 10/29/2016      PT End of Session - 10/29/16 1109    Visit Number 9   Number of Visits 17   Date for PT Re-Evaluation 12/01/16   Authorization Type [employer] Medicare/[employer] Medicare HMO-GCODE every 10th visit   PT Start Time 1105   PT Stop Time 1150   PT Time Calculation (min) 45 min   Activity Tolerance Patient tolerated treatment well   Behavior During Therapy [facility] for tasks assessed/performed      Past Medical History:  Diagnosis Date  . Arthritis   . Atherosclerotic heart disease   . Back pain   . Bruises easily    takes Pletal daily and ASA 325mg  daily  . Cataract immature    right  . Chest pain   . Colon polyps   . Cyst    on perineum;takes Doxycycline daily  . Diabetes mellitus    takes Glipizide,Metformin,and Actos daily  . Diarrhea   . Edema    right leg knee down swollen since fall 3 wks ago  . GERD (gastroesophageal reflux disease)    Rolaids as needed-occ reflux  . Hemorrhoids   . History of kidney stones    at age 69   . Hyperlipidemia    takes Tricor and Lipitor daily  . Hyperlipidemia   . Hypertension    takes Altace,Amlodipine,and Nadolol daily  . Kidney stone    35+yrs ago  . Muscle pain   . Nasal polyps    hx of  . Peripheral edema    wears knee length hose-told by podiatrist to wear these  . Peripheral neuropathy   . Peripheral neuropathy    takes Gabapentin daily  . Pneumonia    as a child  . PVD (peripheral vascular disease) ([place])   . Renal insufficiency   . Seasonal allergies    takes Mucinex and Zyrtec prn  . Urinary frequency    urgency/takes Vesicare daily    Past Surgical History:  Procedure Laterality Date   . Aortobifemoral bypass    . CARDIAC CATHETERIZATION  most recent 05/2011   total of 4  . CAROTID ANGIOGRAM N/A 06/05/2011   Procedure: CAROTID ANGIOGRAM;  Surgeon: Elam Dutch, MD;  Location: [facility] CATH LAB;  Service: Cardiovascular;  Laterality: N/A;  . CAROTID ENDARTERECTOMY  04/08/2011   left CEA  . cataract surgery     left  . COLONOSCOPY    . COLONOSCOPY WITH PROPOFOL N/A 08/22/2013   Procedure: COLONOSCOPY WITH PROPOFOL;  Surgeon: Garlan Fair, MD;  Location: [facility] ENDOSCOPY;  Service: Endoscopy;  Laterality: N/A;  . CORONARY ARTERY BYPASS GRAFT  09/09/2011   Procedure: CORONARY ARTERY BYPASS GRAFTING (CABG);  Surgeon: Gaye Pollack, MD;  Location: [place];  Service: Open Heart Surgery;  Laterality: N/A;  Coronary artery bypass grafting  x three with Right saphenous vein harvested endoscopically and left internal mammary artery  . ENDARTERECTOMY  04/08/2011   Procedure: ENDARTERECTOMY CAROTID;  Surgeon: Elam Dutch, MD;  [facility];  Service: Vascular;  Laterality: Left;  with patch angioplasty  . ENDARTERECTOMY  09/09/2011   Procedure: ENDARTERECTOMY CAROTID;  Surgeon: Elam Dutch, MD;  Location: [place];  Service: Vascular;  Laterality: Right;  with patch angioplasty  . KNEE ARTHROSCOPY  2011  . LEFT HEART CATHETERIZATION WITH CORONARY ANGIOGRAM N/A 07/09/2011   Procedure: LEFT HEART CATHETERIZATION WITH CORONARY ANGIOGRAM;  Surgeon: Sinclair Grooms, MD;  Location: [facility] C[facility] LAB;  Service: Cardiovascular;  Laterality: N/A;  . Reimplantation of inferior mesenteric artery    . Repair of infrarenal abdominal aortic aneurysm    . SHOULDER ARTHROSCOPY W/ ROTATOR CUFF REPAIR     right and left-open procedures  . TONSILLECTOMY     at age 56  . TRIGGER FINGER RELEASE Right 06/28/2014   Procedure: RIGHT LONG TRIGGER RELEASE ;  Surgeon: Leanora Cover, MD;  Location: [place];  Service: Orthopedics;  Laterality: Right;  . wisdom teeth extracted     as a teenager     There were no vitals filed for this visit.      Subjective Assessment - 10/29/16 1108    Subjective No new complaints. No new falls or pain to report.    Pertinent History arthritis, DM, HTN, peripheral neuropathy, PVD, CABG 2013, R RTC repair   Patient Stated Goals Pt's goal for therapy is to be able to walk without cane, not to have to be so careful about walking.   Currently in Pain? No/denies   Pain Score 0-No pain     treatment: Issued the following to pt's HEP today: Perform these in a safe environment such as next to bed/counter top with chair in front of you for safety: Feet Together, Varied Arm Positions - Eyes Closed    Stand with feet together and arms at sides. Close eyes and visualize upright position. Hold _30___ seconds. Repeat ___2-3_ times per session. Do __1-2__ sessions per day.  Copyright  VHI. All rights reserved.    Feet Together, Head Motion - Eyes Closed    With eyes closed and feet together, move head slowly: 1. Up and down x 10 reps 2. Left and right x 10 reps Do __1-2__ sessions per day.  Copyright  VHI. All rights reserved.   Feet Apart (Compliant Surface) Varied Arm Positions - Eyes Closed    Stand on compliant surface: _pillow/s_ with feet shoulder width apart and arms at sides. Close eyes and visualize upright position. Hold__30__ seconds. Repeat __2-3_ times per session. Do __1-2__ sessions per day.  Copyright  VHI. All rights reserved.  Feet Apart (Compliant Surface) Head Motion - Eyes Closed    Stand on compliant surface: _pillow/s_ with feet shoulder width apart. Close eyes and move head slowly: 1. Up and down x 10 reps 2. Left and right x 10 reps Do _1-2___ sessions per day.  Copyright  VHI. All rights reserved.    10/29/16 1630  PT Education  Education provided Yes  Education Details updated/advanced HEP  Person(s) Educated Patient  Methods Explanation;Demonstration;Verbal cues;Handout  Comprehension  Verbalized understanding;Returned demonstration;Tactile cues required;Need further instruction          PT Short Term Goals - 10/27/16 1155      PT SHORT TERM GOAL #1   Title Pt will be independent with HEP for improved balance, strength and gait.  TARGET 11/01/16   Time 4   Period Weeks   Status Achieved     PT SHORT TERM GOAL #2   Title Pt will improve TUG score to less than or equal to 20 seconds for decreased fall risk.   Baseline 19.96 sec   Time 4   Period Weeks   Status Achieved  PT SHORT TERM GOAL #3   Title Pt will improve Dynamic Gait Index score to at least 12/24 for decreased fall risk.   Baseline 15/24  10/27/16   Time 4   Period Weeks   Status Achieved     PT SHORT TERM GOAL #4   Title Pt will improve composite score on Sensory Organization test by at least 10% for improved overall balance. (Per SOT report on 10/07/16-composite score 46/100)   Baseline Composite score 10/27/16:  64 (WNL)   Time 4   Period Weeks   Status Achieved           PT Long Term Goals - 10/27/16 1154      PT LONG TERM GOAL #1   Title Pt will verbalize understanding of fall prevention in the home environment.  TARGET 12/01/16   Time 8   Period Weeks   Status On-going     PT LONG TERM GOAL #2   Title Pt will improve TUG score to less than or equal to 17 seconds for decreased fall risk.   Time 8   Period Weeks   Status On-going     PT LONG TERM GOAL #3   Title Pt will improve Dynamic Gait Index score to at least 17/24 for decreased fall risk.   Time 8   Period Weeks   Status On-going     PT LONG TERM GOAL #4   Title Pt will improve gait velocity to at least 2.62 ft/sec for improved gait efficiency and safety.   Time 8   Period Weeks   Status On-going     PT LONG TERM GOAL #5   Title Pt will verbalize plans for continued community fitness upon D/C from PT.   Time 8   Period Weeks   Status On-going     PT LONG TERM GOAL #6   Title Pt will improve FOTO status score  by at least 10% to demo improved balance.   Time 8   Period Weeks   Status On-going               Plan - 10/29/16 1109    Clinical Impression Statement Today's skilled session focused on advancing pt's balance HEP with no issues reported in session. Pt is progressing towards goals and should benefit from continued PT to progress toward unmet goals.    Rehab Potential Good   PT Frequency 2x / week   PT Duration 8 weeks  plus eval   PT Treatment/Interventions ADLs/Self Care Home Management;Functional mobility training;Gait training;Therapeutic activities;Therapeutic exercise;Balance training;Neuromuscular re-education;Patient/family education;Orthotic Fit/Training   PT Next Visit Plan G-code next visit. fall prevention education and continue to work on high level balance activities   Consulted and Agree with Plan of Care Patient      Patient will benefit from skilled therapeutic intervention in order to improve the following deficits and impairments:  Abnormal gait, Decreased balance, Decreased mobility, Decreased range of motion, Decreased strength, Impaired flexibility, Difficulty walking  Visit Diagnosis: Unsteadiness on feet  Other abnormalities of gait and mobility  Muscle weakness (generalized)     Problem List Patient Active Problem List   Diagnosis Date Noted  . Essential hypertension 02/06/2014  . Chronic diastolic heart failure ([place]) 02/06/2014  . Chronic kidney disease, stage III (moderate) 02/06/2014  . Aftercare following surgery of the circulatory system, [place] 01/25/2014  . Carotid stenosis 06/08/2013  . Stricture of artery ([place]) 04/14/2012  . Peripheral vascular disease, unspecified ([place]) 10/08/2011  . DVT  of upper extremity (deep vein thrombosis), left 09/29/2011  . S/P CABG (coronary artery bypass graft) 09/29/2011  . H/O carotid endarterectomy 09/29/2011  . Occlusion and stenosis of carotid artery without mention of cerebral infarction 06/11/2011  .  Diabetes mellitus type 2 with complications ([place]) 89/38/1017    Willow Ora, PTA, [place] [address], [place], [place] [address] [phone] 10/31/16, 1:47 PM   Name: [name] MRN: [id number] Date of Birth: [birth year]

## 2016-11-09 ENCOUNTER — Ambulatory Visit: Payer: Medicare HMO | Admitting: Physical Therapy

## 2016-11-16 ENCOUNTER — Ambulatory Visit: Payer: Medicare HMO | Admitting: Physical Therapy

## 2016-11-16 DIAGNOSIS — M6281 Muscle weakness (generalized): Secondary | ICD-10-CM | POA: Diagnosis not present

## 2016-11-16 DIAGNOSIS — R2689 Other abnormalities of gait and mobility: Secondary | ICD-10-CM | POA: Diagnosis not present

## 2016-11-16 DIAGNOSIS — R2681 Unsteadiness on feet: Secondary | ICD-10-CM

## 2016-11-16 NOTE — Therapy (Signed)
[facility] [address] [place], [place], [address] Phone: [phone]   Fax:  [phone]  Physical Therapy Treatment  Patient Details  Name: [name] MRN: [id number] Date of Birth: 06-Jul-1940 Referring Provider: Dr. Lavone Orn  Encounter Date: 11/16/2016      PT End of Session - 11/16/16 2117    Visit Number 10   Number of Visits 17   Date for PT Re-Evaluation 12/01/16   Authorization Type [employer] Medicare/[employer] Medicare HMO-GCODE every 10th visit   PT Start Time 1235   PT Stop Time 1319   PT Time Calculation (min) 44 min   Activity Tolerance Patient tolerated treatment well   Behavior During Therapy [facility] for tasks assessed/performed      Past Medical History:  Diagnosis Date  . Arthritis   . Atherosclerotic heart disease   . Back pain   . Bruises easily    takes Pletal daily and ASA 361m daily  . Cataract immature    right  . Chest pain   . Colon polyps   . Cyst    on perineum;takes Doxycycline daily  . Diabetes mellitus    takes Glipizide,Metformin,and Actos daily  . Diarrhea   . Edema    right leg knee down swollen since fall 3 wks ago  . GERD (gastroesophageal reflux disease)    Rolaids as needed-occ reflux  . Hemorrhoids   . History of kidney stones    at age 76  . Hyperlipidemia    takes Tricor and Lipitor daily  . Hyperlipidemia   . Hypertension    takes Altace,Amlodipine,and Nadolol daily  . Kidney stone    35+yrs ago  . Muscle pain   . Nasal polyps    hx of  . Peripheral edema    wears knee length hose-told by podiatrist to wear these  . Peripheral neuropathy   . Peripheral neuropathy    takes Gabapentin daily  . Pneumonia    as a child  . PVD [place]) [place]   . Renal insufficiency   . Seasonal allergies    takes Mucinex and Zyrtec prn  . Urinary frequency    urgency/takes Vesicare daily    Past Surgical History:  Procedure Laterality  Date  . Aortobifemoral bypass    . CARDIAC CATHETERIZATION  most recent 05/2011   total of 4  . CAROTID ANGIOGRAM N/A 06/05/2011   Procedure: CAROTID ANGIOGRAM;  Surgeon: CElam Dutch MD;  [facility];  Service: Cardiovascular;  Laterality: N/A;  . CAROTID ENDARTERECTOMY  04/08/2011   left CEA  . cataract surgery     left  . COLONOSCOPY    . COLONOSCOPY WITH PROPOFOL N/A 08/22/2013   Procedure: COLONOSCOPY WITH PROPOFOL;  Surgeon: MGarlan Fair MD;  [facility]: [facility];  Service: Endoscopy;  Laterality: N/A;  . CORONARY ARTERY BYPASS GRAFT  09/09/2011   Procedure: CORONARY ARTERY BYPASS GRAFTING (CABG);  Surgeon: BGaye Pollack MD;  [place] [facility]  Service: Open Heart Surgery;  Laterality: N/A;  Coronary artery bypass grafting  x three with Right saphenous vein harvested endoscopically and left internal mammary artery  . ENDARTERECTOMY  04/08/2011   Procedure: ENDARTERECTOMY CAROTID;  Surgeon: CElam Dutch MD;  [facility];  Service: Vascular;  Laterality: Left;  with patch angioplasty  . ENDARTERECTOMY  09/09/2011   Procedure: ENDARTERECTOMY CAROTID;  Surgeon: CElam Dutch MD;  [place] [facility]  Service: Vascular;  Laterality: Right;  with patch angioplasty  . KNEE ARTHROSCOPY  2011  . LEFT HEART CATHETERIZATION WITH CORONARY ANGIOGRAM N/A 07/09/2011   Procedure: LEFT HEART CATHETERIZATION WITH CORONARY ANGIOGRAM;  Surgeon: Sinclair Grooms, MD;  Location: [facility] CATH LAB;  Service: Cardiovascular;  Laterality: N/A;  . Reimplantation of inferior mesenteric artery    . Repair of infrarenal abdominal aortic aneurysm    . SHOULDER ARTHROSCOPY W/ ROTATOR CUFF REPAIR     right and left-open procedures  . TONSILLECTOMY     at age 13  . TRIGGER FINGER RELEASE Right 06/28/2014   Procedure: RIGHT LONG TRIGGER RELEASE ;  Surgeon: Leanora Cover, MD;  Location: [place];  Service: Orthopedics;  Laterality: Right;  . wisdom teeth extracted     as a  teenager    There were no vitals filed for this visit.      Subjective Assessment - 11/16/16 1239    Subjective Missed an appointment last week (due to sleeping in too long).  C/o pain in R foot at times, after getting up after lying down or sitting down too long.  It's gone away since I walked back into the gym area.   Pertinent History arthritis, DM, HTN, peripheral neuropathy, PVD, CABG 2013, R RTC repair   Patient Stated Goals Pt's goal for therapy is to be able to walk without cane, not to have to be so careful about walking.   Currently in Pain? No/denies                         [facility] Adult PT Treatment/Exercise - 11/16/16 1248      Ambulation/Gait   Ambulation/Gait Yes   Ambulation/Gait Assistance 6: Modified independent (Device/Increase time)   Ambulation Distance (Feet) 300 Feet   Assistive device Straight cane   Gait Pattern Step-through pattern;Decreased dorsiflexion - left;Trendelenburg;Wide base of support;Poor foot clearance - left;Poor foot clearance - right   Ambulation Surface Level;Indoor   Gait velocity 16.19 sec= 2.03 ft/sec     Standardized Balance Assessment   Standardized Balance Assessment Timed Up and Go Test;Dynamic Gait Index     Dynamic Gait Index   Level Surface Mild Impairment   Change in Gait Speed Mild Impairment   Gait with Horizontal Head Turns Mild Impairment   Gait with Vertical Head Turns Mild Impairment   Gait and Pivot Turn Mild Impairment   Step Over Obstacle Mild Impairment   Step Around Obstacles Mild Impairment   Steps Moderate Impairment   Total Score 15     Timed Up and Go Test   TUG Normal TUG   Normal TUG (seconds) 21.66     Self-Care   Self-Care Other Self-Care Comments   Other Self-Care Comments  Discussed fall prevention in home environment.  Discussed safety concerns with pt's current set-up for HEP (balance exercises meant to be performed in corner-pt reports he has not corner other than the shower and  he would prefer to perform between counter and chair. However, during review of HEP today (pt reports that he hasn't done these exercises before)-he has signifciant LOB, moving chair and requiring therapist to assist.  Discussed pt's limited progress with PT goals (likely due to pt's inconsistent attendance and ? HEP performance at home).  Discussed continueing to perform stregnthening and basic balance HEP at home and continued use of Nu-Step at [facility]             Balance Exercises - 11/16/16 2115  Balance Exercises: Standing   Other Standing Exercises Review of HEP given last visit-pt reports not remembering doing these.  He requests to perform at counter and chair, and has LOB with EC and feet together on solid surface, requiring therapist assist for recovery.  Discussed safety concerns with pt performing these at home not in a safe place-advised pt to d/c HEP given last visit.           PT Education - 11/16/16 2117    Education provided Yes   Education Details Fall prevention, progress towards goals and plans for discharge this visit; discussed safety concerns from HEP and d/c HEP given last visit.   Person(s) Educated Patient   Methods Explanation;Demonstration;Handout;Verbal cues   Comprehension Verbalized understanding          PT Short Term Goals - 10/27/16 1155      PT SHORT TERM GOAL #1   Title Pt will be independent with HEP for improved balance, strength and gait.  TARGET 11/01/16   Time 4   Period Weeks   Status Achieved     PT SHORT TERM GOAL #2   Title Pt will improve TUG score to less than or equal to 20 seconds for decreased fall risk.   Baseline 19.96 sec   Time 4   Period Weeks   Status Achieved     PT SHORT TERM GOAL #3   Title Pt will improve Dynamic Gait Index score to at least 12/24 for decreased fall risk.   Baseline 15/24  10/27/16   Time 4   Period Weeks   Status Achieved     PT SHORT TERM GOAL #4   Title Pt will improve composite  score on Sensory Organization test by at least 10% for improved overall balance. (Per SOT report on 10/07/16-composite score 46/100)   Baseline Composite score 10/27/16:  64 (WNL)   Time 4   Period Weeks   Status Achieved           PT Long Term Goals - 11/16/16 1243      PT LONG TERM GOAL #1   Title Pt will verbalize understanding of fall prevention in the home environment.  TARGET 12/01/16   Time 8   Period Weeks   Status Achieved     PT LONG TERM GOAL #2   Title Pt will improve TUG score to less than or equal to 17 seconds for decreased fall risk.   Baseline 21.66 sec 11/16/16   Time 8   Period Weeks   Status Not Met     PT LONG TERM GOAL #3   Title Pt will improve Dynamic Gait Index score to at least 17/24 for decreased fall risk.   Time 8   Period Weeks   Status Not Met     PT LONG TERM GOAL #4   Title Pt will improve gait velocity to at least 2.62 ft/sec for improved gait efficiency and safety.   Baseline 2.03 ft/sec 11/16/16   Time 8   Period Weeks   Status Not Met     PT LONG TERM GOAL #5   Title Pt will verbalize plans for continued community fitness upon D/C from PT.   Time 8   Period Weeks   Status Achieved     PT LONG TERM GOAL #6   Title Pt will improve FOTO status score by at least 10% to demo improved balance.   Time 8   Period Weeks   Status Achieved  Plan - 11/20/16 07/24/16    Clinical Impression Statement Today was pt's last scheduled PT session, so LTGs were assessed.  Pt has met LTG 1 and 5, 6.  LTG 2, 3, 4 not met, with TUG, DGI and gait velocity largely unchanged.  Discussed safety awareness with HEP and fall prevention.  PT recommends discharge from PT this visit due to inconsistent attendance, inconsistent performance of HEP, and largely unchanged objective measures.   Rehab Potential Good   PT Frequency 2x / week   PT Duration 8 weeks  plus eval   PT Treatment/Interventions ADLs/Self Care Home Management;Functional mobility  training;Gait training;Therapeutic activities;Therapeutic exercise;Balance training;Neuromuscular re-education;Patient/family education;Orthotic Fit/Training   PT Next Visit Plan Discharge PT this visit.   Consulted and Agree with Plan of Care Patient      Patient will benefit from skilled therapeutic intervention in order to improve the following deficits and impairments:  Abnormal gait, Decreased balance, Decreased mobility, Decreased range of motion, Decreased strength, Impaired flexibility, Difficulty walking  Visit Diagnosis: Other abnormalities of gait and mobility  Unsteadiness on feet       G-Codes - 2016-11-20 07/25/19    Functional Assessment Tool Used (Outpatient Only) TUG 21.66 sec, gt vel 2.03 ft/sec, DGI 15/24   Functional Limitation Mobility: Walking and moving around   Mobility: Walking and Moving Around Goal Status [phone]) At least 20 percent but less than 40 percent impaired, limited or restricted   Mobility: Walking and Moving Around Discharge Status [phone]) At least 20 percent but less than 40 percent impaired, limited or restricted      Problem List Patient Active Problem List   Diagnosis Date Noted  . Essential hypertension 02/06/2014  . Chronic diastolic heart failure ([place]) 02/06/2014  . Chronic kidney disease, stage III (moderate) 02/06/2014  . Aftercare following surgery of the circulatory system, [place] 01/25/2014  . Carotid stenosis 06/08/2013  . Stricture of artery ([place]) 04/14/2012  . Peripheral vascular disease, unspecified ([place]) 10/08/2011  . DVT of upper extremity (deep vein thrombosis), left 09/29/2011  . S/P CABG (coronary artery bypass graft) 09/29/2011  . H/O carotid endarterectomy 09/29/2011  . Occlusion and stenosis of carotid artery without mention of cerebral infarction 06/11/2011  . Diabetes mellitus type 2 with complications ([place]) 33/82/5053    Hildreth Robart W. 2016-11-20, 9:23 PM  Frazier Butt., PT   [facility] [address] [place], [place], [address] Phone: [phone]   Fax:  [phone]  Name: MACKINLEY KIEHN MRN: [id number] Date of Birth: March 25, 1941   PHYSICAL THERAPY DISCHARGE SUMMARY  Visits from Start of Care: 10  Current functional level related to goals / functional outcomes:     PT Long Term Goals - 2016-11-20 1243      PT LONG TERM GOAL #1   Title Pt will verbalize understanding of fall prevention in the home environment.  TARGET 12/01/16   Time 8   Period Weeks   Status Achieved     PT LONG TERM GOAL #2   Title Pt will improve TUG score to less than or equal to 17 seconds for decreased fall risk.   Baseline 21.66 sec 2016/11/20   Time 8   Period Weeks   Status Not Met     PT LONG TERM GOAL #3   Title Pt will improve Dynamic Gait Index score to at least 17/24 for decreased fall risk.   Time 8   Period Weeks   Status Not Met     PT  LONG TERM GOAL #4   Title Pt will improve gait velocity to at least 2.62 ft/sec for improved gait efficiency and safety.   Baseline 2.03 ft/sec 11/16/16   Time 8   Period Weeks   Status Not Met     PT LONG TERM GOAL #5   Title Pt will verbalize plans for continued community fitness upon D/C from PT.   Time 8   Period Weeks   Status Achieved     PT LONG TERM GOAL #6   Title Pt will improve FOTO status score by at least 10% to demo improved balance.   Time 8   Period Weeks   Status Achieved     Pt has met 3 of 6 LTGs   Remaining deficits: Balance, pt at fall risk per DGI and TUG   Education / Equipment: Educated in [employer], fall prevention.   Plan: Patient agrees to discharge.  Patient goals were partially met. Patient is being discharged due to lack of progress.  ?????Pt seen for STG check 10/27/16 and has been seen for one visit since.  Pt in agreement with discharge this visit.        Mady Haagensen, PT 11/16/16 9:26 PM Phone: [phone] Fax: [phone]

## 2016-11-16 NOTE — Patient Instructions (Signed)

## 2016-12-25 DIAGNOSIS — E1129 Type 2 diabetes mellitus with other diabetic kidney complication: Secondary | ICD-10-CM | POA: Diagnosis not present

## 2016-12-25 DIAGNOSIS — E1165 Type 2 diabetes mellitus with hyperglycemia: Secondary | ICD-10-CM | POA: Diagnosis not present

## 2016-12-25 DIAGNOSIS — E1151 Type 2 diabetes mellitus with diabetic peripheral angiopathy without gangrene: Secondary | ICD-10-CM | POA: Diagnosis not present

## 2016-12-25 DIAGNOSIS — E1142 Type 2 diabetes mellitus with diabetic polyneuropathy: Secondary | ICD-10-CM | POA: Diagnosis not present

## 2016-12-25 DIAGNOSIS — E782 Mixed hyperlipidemia: Secondary | ICD-10-CM | POA: Diagnosis not present

## 2016-12-25 DIAGNOSIS — I129 Hypertensive chronic kidney disease with stage 1 through stage 4 chronic kidney disease, or unspecified chronic kidney disease: Secondary | ICD-10-CM | POA: Diagnosis not present

## 2016-12-25 DIAGNOSIS — E039 Hypothyroidism, unspecified: Secondary | ICD-10-CM | POA: Diagnosis not present

## 2016-12-25 DIAGNOSIS — N182 Chronic kidney disease, stage 2 (mild): Secondary | ICD-10-CM | POA: Diagnosis not present

## 2016-12-25 DIAGNOSIS — I2510 Atherosclerotic heart disease of native coronary artery without angina pectoris: Secondary | ICD-10-CM | POA: Diagnosis not present

## 2016-12-25 DIAGNOSIS — M12811 Other specific arthropathies, not elsewhere classified, right shoulder: Secondary | ICD-10-CM | POA: Diagnosis not present

## 2017-02-16 DIAGNOSIS — M25561 Pain in right knee: Secondary | ICD-10-CM | POA: Diagnosis not present

## 2017-02-16 DIAGNOSIS — M25571 Pain in right ankle and joints of right foot: Secondary | ICD-10-CM | POA: Diagnosis not present

## 2017-02-18 ENCOUNTER — Ambulatory Visit (HOSPITAL_COMMUNITY)
Admission: RE | Admit: 2017-02-18 | Discharge: 2017-02-18 | Disposition: A | Discharge status: Home / Self Care | Payer: Medicare HMO | Source: Ambulatory Visit | Attending: Vascular Surgery | Admitting: Vascular Surgery

## 2017-02-18 ENCOUNTER — Ambulatory Visit (INDEPENDENT_AMBULATORY_CARE_PROVIDER_SITE_OTHER)
Admission: RE | Admit: 2017-02-18 | Discharge: 2017-02-18 | Disposition: A | Discharge status: Home / Self Care | Payer: Medicare HMO | Source: Ambulatory Visit | Attending: Vascular Surgery | Admitting: Vascular Surgery

## 2017-02-18 ENCOUNTER — Ambulatory Visit (INDEPENDENT_AMBULATORY_CARE_PROVIDER_SITE_OTHER): Payer: Medicare HMO | Admitting: Vascular Surgery

## 2017-02-18 ENCOUNTER — Encounter: Payer: Self-pay | Admitting: Vascular Surgery

## 2017-02-18 VITALS — BP 154/56 | HR 55 | Temp 97.5°F | Resp 20 | Ht 70.5 in | Wt 276.0 lb

## 2017-02-18 DIAGNOSIS — I10 Essential (primary) hypertension: Secondary | ICD-10-CM | POA: Diagnosis not present

## 2017-02-18 DIAGNOSIS — I739 Peripheral vascular disease, unspecified: Secondary | ICD-10-CM

## 2017-02-18 DIAGNOSIS — I6523 Occlusion and stenosis of bilateral carotid arteries: Secondary | ICD-10-CM | POA: Diagnosis not present

## 2017-02-18 DIAGNOSIS — R0989 Other specified symptoms and signs involving the circulatory and respiratory systems: Secondary | ICD-10-CM | POA: Diagnosis present

## 2017-02-18 DIAGNOSIS — Z87891 Personal history of nicotine dependence: Secondary | ICD-10-CM | POA: Diagnosis not present

## 2017-02-18 DIAGNOSIS — E785 Hyperlipidemia, unspecified: Secondary | ICD-10-CM | POA: Insufficient documentation

## 2017-02-18 DIAGNOSIS — E1151 Type 2 diabetes mellitus with diabetic peripheral angiopathy without gangrene: Secondary | ICD-10-CM | POA: Diagnosis not present

## 2017-02-18 DIAGNOSIS — R938 Abnormal findings on diagnostic imaging of other specified body structures: Secondary | ICD-10-CM | POA: Diagnosis not present

## 2017-02-18 LAB — VAS US CAROTID
LCCADDIAS: -4 cm/s
LCCADSYS: -70 cm/s
LICADDIAS: -8 cm/s
Left CCA prox dias: 4 cm/s
Left CCA prox sys: 116 cm/s
Left ICA dist sys: -58 cm/s
Left ICA prox dias: -2 cm/s
Left ICA prox sys: -64 cm/s
RCCADSYS: -81 cm/s
RIGHT ECA DIAS: -4 cm/s
RIGHT VERTEBRAL DIAS: 7 cm/s
Right CCA prox dias: 2 cm/s
Right CCA prox sys: 89 cm/s

## 2017-02-18 NOTE — Progress Notes (Signed)
History of Present Illness:  Patient is a 76y.o. year old male who presents continued follow-up of his peripheral vascular disease and carotid disease. He is s/p right CEA and aorta-innominate graft on 09/09/11 and left CEA 04/08/11, aortobifemoral bypass grafting (2007). The patient denies symptoms of TIA, amaurosis, or stroke.  He does continue to have balance and weakness/numbness with ambulation and weakness/numbness in his legs.  He stops to rest and he can go again.  No history of non healing wounds on B LE.   The patient is currently on Aspirin 325 mg antiplatelet therapy.   Other medical problems include DM, HTN, Hypercholesterolemia and CAD.  His health has been stable with out change.     Past Medical History:  Diagnosis Date  . Arthritis   . Atherosclerotic heart disease   . Back pain   . Bruises easily    takes Pletal daily and ASA 325mg  daily  . Cataract immature    right  . Chest pain   . Colon polyps   . Cyst    on perineum;takes Doxycycline daily  . Diabetes mellitus    takes Glipizide,Metformin,and Actos daily  . Diarrhea   . Edema    right leg knee down swollen since fall 3 wks ago  . GERD (gastroesophageal reflux disease)    Rolaids as needed-occ reflux  . Hemorrhoids   . History of kidney stones    at age 4   . Hyperlipidemia    takes Tricor and Lipitor daily  . Hyperlipidemia   . Hypertension    takes Altace,Amlodipine,and Nadolol daily  . Kidney stone    35+yrs ago  . Muscle pain   . Nasal polyps    hx of  . Peripheral edema    wears knee length hose-told by podiatrist to wear these  . Peripheral neuropathy   . Peripheral neuropathy    takes Gabapentin daily  . Pneumonia    as a child  . PVD (peripheral vascular disease) ([place])   . Renal insufficiency   . Seasonal allergies    takes Mucinex and Zyrtec prn  . Urinary frequency    urgency/takes Vesicare daily    Past Surgical History:  Procedure Laterality Date  . Aortobifemoral  bypass    . CARDIAC CATHETERIZATION  most recent 05/2011   total of 4  . CAROTID ANGIOGRAM N/A 06/05/2011   Procedure: CAROTID ANGIOGRAM;  Surgeon: Elam Dutch, MD;  Location: [facility] CATH LAB;  Service: Cardiovascular;  Laterality: N/A;  . CAROTID ENDARTERECTOMY  04/08/2011   left CEA  . cataract surgery     left  . COLONOSCOPY    . COLONOSCOPY WITH PROPOFOL N/A 08/22/2013   Procedure: COLONOSCOPY WITH PROPOFOL;  Surgeon: Garlan Fair, MD;  Location: [facility] END[facility];  Service: Endoscopy;  Laterality: N/A;  . CORONARY ARTERY BYPASS GRAFT  09/09/2011   Procedure: CORONARY ARTERY BYPASS GRAFTING (CABG);  Surgeon: Gaye Pollack, MD;  Location: [facility];  Service: Open Heart Surgery;  Laterality: N/A;  Coronary artery bypass grafting  x three with Right saphenous vein harvested endoscopically and left internal mammary artery  . ENDARTERECTOMY  04/08/2011   Procedure: ENDARTERECTOMY CAROTID;  Surgeon: Elam Dutch, MD;  Location: [facility] OR;  Service: Vascular;  Laterality: Left;  with patch angioplasty  . ENDARTERECTOMY  09/09/2011   Procedure: ENDARTERECTOMY CAROTID;  Surgeon: Elam Dutch, MD;  Location: [facility] OR;  Service: Vascular;  Laterality: Right;  with patch angioplasty  .  KNEE ARTHROSCOPY  2011  . LEFT HEART CATHETERIZATION WITH CORONARY ANGIOGRAM N/A 07/09/2011   Procedure: LEFT HEART CATHETERIZATION WITH CORONARY ANGIOGRAM;  Surgeon: Sinclair Grooms, MD;  Location: [facility] CATH LAB;  Service: Cardiovascular;  Laterality: N/A;  . Reimplantation of inferior mesenteric artery    . Repair of infrarenal abdominal aortic aneurysm    . SHOULDER ARTHROSCOPY W/ ROTATOR CUFF REPAIR     right and left-open procedures  . TONSILLECTOMY     at age 31  . TRIGGER FINGER RELEASE Right 06/28/2014   Procedure: RIGHT LONG TRIGGER RELEASE ;  Surgeon: Leanora Cover, MD;  Location: [facility];  Service: Orthopedics;  Laterality: Right;  . wisdom teeth extracted     as a teenager     Social  History Social History  Substance Use Topics  . Smoking status: Former Smoker    Years: 40.00    Types: Cigarettes    Quit date: 10/27/2005  . Smokeless tobacco: Never Used  . Alcohol use 0.0 oz/week    2 - 3 Cans of beer per week     Comment: occasional- weekly    Family History Family History  Problem Relation Age of Onset  . Heart disease Mother   . Heart disease Father   . Anesthesia problems Neg Hx   . Hypotension Neg Hx   . Malignant hyperthermia Neg Hx   . Pseudochol deficiency Neg Hx     Allergies  Allergies  Allergen Reactions  . Amoxicillin-Pot Clavulanate Nausea Only    Augmentin      Current Outpatient Prescriptions  Medication Sig Dispense Refill  . aspirin 325 MG EC tablet Take 325 mg by mouth every evening.     Marland Kitchen atorvastatin (LIPITOR) 20 MG tablet Take 20 mg by mouth every evening.     Marland Kitchen b complex vitamins capsule Take 1 capsule by mouth daily.      . Cetirizine HCl (ZYRTEC ALLERGY PO) Take 10 tablets by mouth daily as needed (allergies). For allergies    . cilostazol (PLETAL) 100 MG tablet Take 100 mg by mouth 2 (two) times daily.     . Cyanocobalamin (VITAMIN B 12 PO) Take 1 tablet by mouth every other day.    . fenofibrate (TRICOR) 145 MG tablet Take 145 mg by mouth every morning.    . furosemide (LASIX) 40 MG tablet Take 40 mg by mouth daily as needed for fluid or edema.    . gabapentin (NEURONTIN) 300 MG capsule Take two (2) capsules (600 mg total) by mouth every morning and take one (1) capsule (300 mg total) by mouth every evening. Can take and additional dose one (1) capsule (300 mg total) by mouth as needed for nerve pain.    Marland Kitchen glipiZIDE (GLUCOTROL) 10 MG tablet Take 10 mg by mouth every morning.     Marland Kitchen JANUVIA 100 MG tablet TK 1 T PO QD  2  . levothyroxine (SYNTHROID, LEVOTHROID) 50 MCG tablet Take 50 mcg by mouth daily before breakfast.    . Loperamide HCl (IMODIUM PO) Take 2 mg by mouth daily as needed. For diarrhea    . methylcellulose  (ARTIFICIAL TEARS) 1 % ophthalmic solution Place 1 drop into both eyes 2 (two) times daily as needed (dry eyes).    . metoprolol (LOPRESSOR) 50 MG tablet Take 50 mg by mouth 2 (two) times daily.    . nitroGLYCERIN (NITROSTAT) 0.4 MG SL tablet PLACE 1 TABLET UNDER TONGUE EVERY 5 MINUTES AS NEEDED  FOR CHEST PAIN. 75 tablet 1  . pioglitazone (ACTOS) 15 MG tablet Take 30 mg by mouth daily.     . potassium chloride SA (K-DUR,KLOR-CON) 20 MEQ tablet Take 20 mEq by mouth daily as needed (Take with lasix).    . ramipril (ALTACE) 10 MG capsule Take 10 mg by mouth daily.    . simethicone (MYLICON) 80 MG chewable tablet Chew 80-160 mg by mouth every 6 (six) hours as needed for flatulence.    . sodium chloride (OCEAN) 0.65 % SOLN nasal spray Place 1 spray into both nostrils 2 (two) times daily as needed for congestion.    Marland Kitchen erythromycin ophthalmic ointment USE A SMALL AMOUNT IN THE LEFT EYE TID AS DIRECTED  2  . HYDROcodone-acetaminophen (NORCO/VICODIN) 5-325 MG tablet Take 1-2 tablets by mouth every 6 (six) hours as needed for moderate pain.    . pseudoephedrine-guaifenesin (MUCINEX D) 60-600 MG per tablet Take 1 tablet by mouth every 12 (twelve) hours as needed for congestion. Congestion    . temazepam (RESTORIL) 15 MG capsule Take 15 mg by mouth at bedtime as needed. For sleep     No current facility-administered medications for this visit.     ROS:   General:  No weight loss, Fever, chills  HEENT: No recent headaches, no nasal bleeding, no visual changes, no sore throat  Neurologic: No dizziness, blackouts, seizures. No recent symptoms of stroke or mini- stroke. No recent episodes of slurred speech, or temporary blindness.  Cardiac: No recent episodes of chest pain/pressure, no shortness of breath at rest.  No shortness of breath with exertion.  Denies history of atrial fibrillation or irregular heartbeat  Vascular: No history of rest pain in feet.  No history of claudication.  No history of  non-healing ulcer, No history of DVT   Pulmonary: No home oxygen, no productive cough, no hemoptysis,  No asthma or wheezing  Musculoskeletal:  [x ] Arthritis, [ ]  Low back pain,  [ ]  Joint pain  Hematologic:No history of hypercoagulable state.  No history of easy bleeding.  No history of anemia  Gastrointestinal: No hematochezia or melena,  No gastroesophageal reflux, no trouble swallowing Diarrhea   Urinary: [ ]  chronic Kidney disease, [ ]  on HD - [ ]  MWF or [ ]  TTHS, [ ]  Burning with urination, [ ]  Frequent urination, [ ]  Difficulty urinating;   Skin: No rashes  Psychological: No history of anxiety,  No history of depression   Physical Examination  Vitals:   02/18/17 1429 02/18/17 1438  BP: (!) 168/61 (!) 154/56  Pulse: (!) 55   Resp: 20   Temp: (!) 97.5 F (36.4 C)   TempSrc: Oral   SpO2: 98%   Weight: 276 lb (125.2 kg)   Height: 5' 10.5" (1.791 m)     Body mass index is 39.04 kg/m.  General:  Alert and oriented, no acute distress HEENT: Normal Neck: No bruit or JVD Pulmonary: Clear to auscultation bilaterally Cardiac: Regular Rate and Rhythm positive murmur Gastrointestinal: Soft, non-tender, non-distended, no mass, no scars Skin: No rash Extremity Pulses:  2+ radial, brachial, femoral bilaterally Musculoskeletal: No deformity, B LE edema without skin changes  Neurologic: Upper and lower extremity motor 5/5 and symmetric  DATA:  Carotid duplex Patent aorta-innominate artery by pass No significant ICA stenosis  ABI Right 0.74 monophasic flow Left 0.25 monophasic flow   ASSESSMENT:   peripheral vascular disease and carotid disease. He is s/p right CEA and aorta-innominate graft on 09/09/11 and left  CEA 04/08/11, aortobifemoral bypass grafting (2007).   PLAN: Patent aorta-innominate by pass PAD with without symptoms of claudication No significant change in his carotid duplex or ABI's since 2017.  We recommend a year f/u for repeat studies as above.  We  recommend that he stay as active as possible.     Theda Sers, EMMA MAUREEN PA-C [facility]  The patient was seen in conjunction with Dr. Oneida Alar today  History and exam details as above. Patient's carotid occlusive disease is stable he is asymptomatic. He has no significant carotid stenosis currently. #2 peripheral arterial disease. Patient has a patent aortobifemoral bypass. He does have decreased ABIs bilaterally but really has only minimal symptoms from this. He does have some leg weakness but some of this also seems like neuropathy. He does not have any nonhealing wounds. I would only consider a revascularization procedure on him if he had nonhealing wounds and was at risk of limb loss due to the fact that he would require redo operation which would be high risk due to his obesity.  Fortunately his symptoms are minimal at this point.  Patient will follow-up in one year with repeat ABIs and carotid duplex scan. He'll return sooner if he develops any wounds on his feet or has any symptoms of TIA amaurosis or stroke.  Ruta Hinds, MD [facility] Office: [phone] Pager: [phone]

## 2017-03-26 DIAGNOSIS — M79641 Pain in right hand: Secondary | ICD-10-CM | POA: Diagnosis not present

## 2017-03-26 DIAGNOSIS — S62316A Displaced fracture of base of fifth metacarpal bone, right hand, initial encounter for closed fracture: Secondary | ICD-10-CM | POA: Diagnosis not present

## 2017-03-26 DIAGNOSIS — M25641 Stiffness of right hand, not elsewhere classified: Secondary | ICD-10-CM | POA: Diagnosis not present

## 2017-03-26 DIAGNOSIS — S62316D Displaced fracture of base of fifth metacarpal bone, right hand, subsequent encounter for fracture with routine healing: Secondary | ICD-10-CM | POA: Diagnosis not present

## 2017-04-05 DIAGNOSIS — S62316D Displaced fracture of base of fifth metacarpal bone, right hand, subsequent encounter for fracture with routine healing: Secondary | ICD-10-CM | POA: Diagnosis not present

## 2017-04-13 DIAGNOSIS — M25561 Pain in right knee: Secondary | ICD-10-CM | POA: Diagnosis not present

## 2017-04-14 ENCOUNTER — Encounter: Payer: Self-pay | Admitting: Interventional Cardiology

## 2017-04-19 DIAGNOSIS — S62316A Displaced fracture of base of fifth metacarpal bone, right hand, initial encounter for closed fracture: Secondary | ICD-10-CM | POA: Diagnosis not present

## 2017-04-19 DIAGNOSIS — S62316D Displaced fracture of base of fifth metacarpal bone, right hand, subsequent encounter for fracture with routine healing: Secondary | ICD-10-CM | POA: Diagnosis not present

## 2017-04-19 DIAGNOSIS — M79641 Pain in right hand: Secondary | ICD-10-CM | POA: Diagnosis not present

## 2017-04-19 DIAGNOSIS — M25641 Stiffness of right hand, not elsewhere classified: Secondary | ICD-10-CM | POA: Diagnosis not present

## 2017-04-21 NOTE — Addendum Note (Signed)
Addended by: Lianne Cure A on: 04/21/2017 11:29 AM   Modules accepted: Orders

## 2017-04-25 NOTE — Progress Notes (Signed)
Cardiology Office Note    Date:  04/26/2017   ID:  [name], DOB 20-Feb-1941, MRN [id number]  PCP:  Lavone Orn, MD  Cardiologist: Sinclair Grooms, MD   Chief Complaint  Patient presents with  . Coronary Artery Disease  . Congestive Heart Failure    History of Present Illness:  [name] is a 76 y.o. male who presents for CAD, chronic combined systolic and diastolic heart failure, bilateral carotid artery disease status post endarterectomy, peripheral arterial disease with claudication, hypertension, chronic kidney disease, and hyperlipidemia.   He denies angina. He has mild orthopnea. Orthopnea stable over months. Right lower extremity swelling occurs since he had bypass surgery multiple years ago. No need for sublingual nitroglycerin. Denies palpitations and has not had syncope.   Past Medical History:  Diagnosis Date  . Arthritis   . Atherosclerotic heart disease   . Back pain   . Bruises easily    takes Pletal daily and ASA 325mg  daily  . Cataract immature    right  . Chest pain   . Colon polyps   . Cyst    on perineum;takes Doxycycline daily  . Diabetes mellitus    takes Glipizide,Metformin,and Actos daily  . Diarrhea   . Edema    right leg knee down swollen since fall 3 wks ago  . GERD (gastroesophageal reflux disease)    Rolaids as needed-occ reflux  . Hemorrhoids   . History of kidney stones    at age 69   . Hyperlipidemia    takes Tricor and Lipitor daily  . Hyperlipidemia   . Hypertension    takes Altace,Amlodipine,and Nadolol daily  . Kidney stone    35+yrs ago  . Muscle pain   . Nasal polyps    hx of  . Peripheral edema    wears knee length hose-told by podiatrist to wear these  . Peripheral neuropathy   . Peripheral neuropathy    takes Gabapentin daily  . Pneumonia    as a child  . PVD (peripheral vascular disease) ([place])   . Renal insufficiency   . Seasonal allergies    takes Mucinex and Zyrtec prn  . Urinary frequency     urgency/takes Vesicare daily    Past Surgical History:  Procedure Laterality Date  . Aortobifemoral bypass    . CARDIAC CATHETERIZATION  most recent 05/2011   total of 4  . CAROTID ANGIOGRAM N/A 06/05/2011   Procedure: CAROTID ANGIOGRAM;  Surgeon: Elam Dutch, MD;  Location[facility] CATH LAB;  Service: Cardiovascular;  Laterality: N/A;  . CAROTID ENDARTERECTOMY  04/08/2011   left CEA  . cataract surgery     left  . COLONOSCOPY    . COLONOSCOPY WITH PROPOFOL N/A 08/22/2013   Procedure: COLONOSCOPY WITH PROPOFOL;  Surgeon: Garlan Fair, MD;  Location: [facility];  Service: Endoscopy;  Laterality: N/A;  . CORONARY ARTERY BYPASS GRAFT  09/09/2011   Procedure: CORONARY ARTERY BYPASS GRAFTING (CABG);  Surgeon: Gaye Pollack, MD;  Location: [facility];  Service: Open Heart Surgery;  Laterality: N/A;  Coronary artery bypass grafting  x three with Right saphenous vein harvested endoscopically and left internal mammary artery  . ENDARTERECTOMY  04/08/2011   Procedure: ENDARTERECTOMY CAROTID;  Surgeon: Elam Dutch, MD;  Location: [facility] OR;  Service: Vascular;  Laterality: Left;  with patch angioplasty  . ENDARTERECTOMY  09/09/2011   Procedure: ENDARTERECTOMY CAROTID;  Surgeon: Elam Dutch, MD;  Location: [facility];  Service:  Vascular;  Laterality: Right;  with patch angioplasty  . KNEE ARTHROSCOPY  2011  . LEFT HEART CATHETERIZATION WITH CORONARY ANGIOGRAM N/A 07/09/2011   Procedure: LEFT HEART CATHETERIZATION WITH CORONARY ANGIOGRAM;  Surgeon: Sinclair Grooms, MD;  Location: [facility] CATH LAB;  Service: Cardiovascular;  Laterality: N/A;  . Reimplantation of inferior mesenteric artery    . Repair of infrarenal abdominal aortic aneurysm    . SHOULDER ARTHROSCOPY W/ ROTATOR CUFF REPAIR     right and left-open procedures  . TONSILLECTOMY     at age 35  . TRIGGER FINGER RELEASE Right 06/28/2014   Procedure: RIGHT LONG TRIGGER RELEASE ;  Surgeon: Leanora Cover, MD;  Location: [place];   Service: Orthopedics;  Laterality: Right;  . wisdom teeth extracted     as a teenager    Current Medications: Outpatient Medications Prior to Visit  Medication Sig Dispense Refill  . aspirin 325 MG EC tablet Take 325 mg by mouth every evening.     Marland Kitchen atorvastatin (LIPITOR) 20 MG tablet Take 20 mg by mouth every evening.     Marland Kitchen b complex vitamins capsule Take 1 capsule by mouth daily.      . Cetirizine HCl (ZYRTEC ALLERGY PO) Take 10 tablets by mouth daily as needed (allergies). For allergies    . cilostazol (PLETAL) 100 MG tablet Take 100 mg by mouth 2 (two) times daily.     . Cyanocobalamin (VITAMIN B 12 PO) Take 1 tablet by mouth every other day.    . erythromycin ophthalmic ointment USE A SMALL AMOUNT IN THE LEFT EYE TID AS DIRECTED  2  . fenofibrate (TRICOR) 145 MG tablet Take 145 mg by mouth every morning.    . furosemide (LASIX) 40 MG tablet Take 40 mg by mouth daily as needed for fluid or edema.    . gabapentin (NEURONTIN) 300 MG capsule Take two (2) capsules (600 mg total) by mouth every morning and take one (1) capsule (300 mg total) by mouth every evening. Can take and additional dose one (1) capsule (300 mg total) by mouth as needed for nerve pain.    Marland Kitchen glipiZIDE (GLUCOTROL XL) 2.5 MG 24 hr tablet Take 2.5 mg by mouth daily.  3  . HYDROcodone-acetaminophen (NORCO/VICODIN) 5-325 MG tablet Take 1-2 tablets by mouth every 6 (six) hours as needed for moderate pain.    Marland Kitchen levothyroxine (SYNTHROID, LEVOTHROID) 50 MCG tablet Take 50 mcg by mouth daily before breakfast.    . Loperamide HCl (IMODIUM PO) Take 2 mg by mouth daily as needed. For diarrhea    . methylcellulose (ARTIFICIAL TEARS) 1 % ophthalmic solution Place 1 drop into both eyes 2 (two) times daily as needed (dry eyes).    . metoprolol (LOPRESSOR) 50 MG tablet Take 50 mg by mouth 2 (two) times daily.    . nitroGLYCERIN (NITROSTAT) 0.4 MG SL tablet PLACE 1 TABLET UNDER TONGUE EVERY 5 MINUTES AS NEEDED FOR CHEST PAIN. 75 tablet 1    . pioglitazone (ACTOS) 15 MG tablet Take 30 mg by mouth daily.     . potassium chloride SA (K-DUR,KLOR-CON) 20 MEQ tablet Take 20 mEq by mouth daily as needed (Take with lasix).    . pseudoephedrine-guaifenesin (MUCINEX D) 60-600 MG per tablet Take 1 tablet by mouth every 12 (twelve) hours as needed for congestion. Congestion    . ramipril (ALTACE) 10 MG capsule Take 10 mg by mouth daily.    . simethicone (MYLICON) 80 MG chewable tablet Chew  80-160 mg by mouth every 6 (six) hours as needed for flatulence.    . sodium chloride (OCEAN) 0.65 % SOLN nasal spray Place 1 spray into both nostrils 2 (two) times daily as needed for congestion.    . temazepam (RESTORIL) 15 MG capsule Take 15 mg by mouth at bedtime as needed. For sleep    . glipiZIDE (GLUCOTROL) 10 MG tablet Take 10 mg by mouth every morning.     Marland Kitchen JANUVIA 100 MG tablet TK 1 T PO QD  2   No facility-administered medications prior to visit.      Allergies:   Amoxicillin-pot clavulanate   Social History   Socioeconomic History  . Marital status: Married    Spouse name: None  . Number of children: None  . Years of education: None  . Highest education level: None  Social Needs  . Financial resource strain: None  . Food insecurity - worry: None  . Food insecurity - inability: None  . Transportation needs - medical: None  . Transportation needs - non-medical: None  Occupational History  . None  Tobacco Use  . Smoking status: Former Smoker    Years: 40.00    Types: Cigarettes    Last attempt to quit: 10/27/2005    Years since quitting: 11.5  . Smokeless tobacco: Never Used  Substance and Sexual Activity  . Alcohol use: Yes    Alcohol/week: 0.0 oz    Types: 2 - 3 Cans of beer per week    Comment: occasional- weekly  . Drug use: No  . Sexual activity: No  Other Topics Concern  . None  Social History Narrative  . None     Family History:  The patient's family history includes Heart disease in his father and mother.    ROS:   Please see the history of present illness.    Right lower extremity swelling better when leg is elevated and not having prolonged standing. Two-pillow dyspnea. Wife states that most mornings he is off the pillows and breathing fine. Intermittent diarrhea, muscle pain, dizziness, easy bruising. Recent fall after his right knee locked and he had head trauma fractured his right arm and had chest wall trauma. Occasional headaches. Difficulty with balance. Leg pain.  All other systems reviewed and are negative.   PHYSICAL EXAM:   VS:  BP (!) 162/62   Pulse (!) 53   Ht 5\' 11"  (1.803 m)   Wt 274 lb 12.8 oz (124.6 kg)   BMI 38.33 kg/m    GEN: Well nourished, well developed, in no acute distress . Morbid obesity. HEENT: normal  Neck: no JVD, carotid bruits, or masses Cardiac: RRR; no murmurs, rubs, or gallops,no edema  Respiratory:  clear to auscultation bilaterally, normal work of breathing GI: soft, nontender, nondistended, + BS MS: no deformity or atrophy  Skin: warm and dry, no rash Neuro:  Alert and Oriented x 3, Strength and sensation are intact Psych: euthymic mood, full affect  Wt Readings from Last 3 Encounters:  04/26/17 274 lb 12.8 oz (124.6 kg)  02/18/17 276 lb (125.2 kg)  04/20/16 283 lb 12.8 oz (128.7 kg)      Studies/Labs Reviewed:   EKG:  EKG  normal sinus rhythm/sinus bradycardia, right bundle branch block, left axis deviation, and when compared to prior from 2017, rate is slower.  Recent Labs: No results found for requested labs within last 8760 hours.   Lipid Panel No results found for: CHOL, TRIG, HDL, CHOLHDL, VLDL, LDLCALC, LDLDIRECT  Additional studies/ records that were reviewed today include:  LDL 1 year ago was 50 80 cholesterol 120. Creatinine 1.15. Hemoglobin 14.3.    ASSESSMENT:    1. Chronic diastolic heart failure ([place])   2. Essential hypertension   3. Peripheral vascular disease, unspecified ([place])   4. Type 2 diabetes mellitus with  complication, with long-term current use of insulin ([place])   5. S/P CABG (coronary artery bypass graft)      PLAN:  In order of problems listed above:  1. No evidence of pulmonary congestion or volume overload. 2. Blood pressure was repeated and was 128/58 mmHg. 3. No complaints of claudication although very diminished exertional challenges. A lot of this is related to back discomfort and difficulty with balance. 4. Not addressed. 5. He is stable from a standpoint of coronary disease. He is status post bypass surgery. No recent angina.  Continue current medical regimen. Hemoglobin A1c target less than 7, LDL cholesterol less than 70, blood pressure less than 130/85 mmHg, weight loss, and aerobic activity as much as tolerated. Call if angina.  Medication Adjustments/Labs and Tests Ordered: Current medicines are reviewed at length with the patient today.  Concerns regarding medicines are outlined above.  Medication changes, Labs and Tests ordered today are listed in the Patient Instructions below. There are no Patient Instructions on file for this visit.   Signed, Sinclair Grooms, MD  04/26/2017 12:02 PM    [facility] [place], [place], [place]  [address] Phone: [phone]; Fax: [phone]

## 2017-04-26 ENCOUNTER — Ambulatory Visit: Payer: Medicare HMO | Admitting: Interventional Cardiology

## 2017-04-26 ENCOUNTER — Encounter: Payer: Self-pay | Admitting: Interventional Cardiology

## 2017-04-26 VITALS — BP 162/62 | HR 53 | Ht 71.0 in | Wt 274.8 lb

## 2017-04-26 DIAGNOSIS — E118 Type 2 diabetes mellitus with unspecified complications: Secondary | ICD-10-CM | POA: Diagnosis not present

## 2017-04-26 DIAGNOSIS — Z951 Presence of aortocoronary bypass graft: Secondary | ICD-10-CM

## 2017-04-26 DIAGNOSIS — Z794 Long term (current) use of insulin: Secondary | ICD-10-CM

## 2017-04-26 DIAGNOSIS — I5032 Chronic diastolic (congestive) heart failure: Secondary | ICD-10-CM

## 2017-04-26 DIAGNOSIS — I10 Essential (primary) hypertension: Secondary | ICD-10-CM | POA: Diagnosis not present

## 2017-04-26 DIAGNOSIS — I739 Peripheral vascular disease, unspecified: Secondary | ICD-10-CM

## 2017-04-26 NOTE — Patient Instructions (Signed)

## 2017-05-04 DIAGNOSIS — M25561 Pain in right knee: Secondary | ICD-10-CM | POA: Diagnosis not present

## 2017-05-04 DIAGNOSIS — R197 Diarrhea, unspecified: Secondary | ICD-10-CM | POA: Diagnosis not present

## 2017-05-05 DIAGNOSIS — M25561 Pain in right knee: Secondary | ICD-10-CM | POA: Diagnosis not present

## 2017-05-10 DIAGNOSIS — S62316D Displaced fracture of base of fifth metacarpal bone, right hand, subsequent encounter for fracture with routine healing: Secondary | ICD-10-CM | POA: Diagnosis not present

## 2017-05-11 DIAGNOSIS — M25561 Pain in right knee: Secondary | ICD-10-CM | POA: Diagnosis not present

## 2017-05-12 DIAGNOSIS — S2241XA Multiple fractures of ribs, right side, initial encounter for closed fracture: Secondary | ICD-10-CM | POA: Diagnosis not present

## 2017-05-12 DIAGNOSIS — M1711 Unilateral primary osteoarthritis, right knee: Secondary | ICD-10-CM | POA: Diagnosis not present

## 2017-05-20 DIAGNOSIS — M170 Bilateral primary osteoarthritis of knee: Secondary | ICD-10-CM | POA: Diagnosis not present

## 2017-05-20 DIAGNOSIS — Z1389 Encounter for screening for other disorder: Secondary | ICD-10-CM | POA: Diagnosis not present

## 2017-05-20 DIAGNOSIS — Z0000 Encounter for general adult medical examination without abnormal findings: Secondary | ICD-10-CM | POA: Diagnosis not present

## 2017-05-20 DIAGNOSIS — E1129 Type 2 diabetes mellitus with other diabetic kidney complication: Secondary | ICD-10-CM | POA: Diagnosis not present

## 2017-05-20 DIAGNOSIS — I10 Essential (primary) hypertension: Secondary | ICD-10-CM | POA: Diagnosis not present

## 2017-05-20 DIAGNOSIS — E1151 Type 2 diabetes mellitus with diabetic peripheral angiopathy without gangrene: Secondary | ICD-10-CM | POA: Diagnosis not present

## 2017-05-20 DIAGNOSIS — E782 Mixed hyperlipidemia: Secondary | ICD-10-CM | POA: Diagnosis not present

## 2017-05-20 DIAGNOSIS — Z5181 Encounter for therapeutic drug level monitoring: Secondary | ICD-10-CM | POA: Diagnosis not present

## 2017-05-20 DIAGNOSIS — E039 Hypothyroidism, unspecified: Secondary | ICD-10-CM | POA: Diagnosis not present

## 2017-05-20 DIAGNOSIS — I739 Peripheral vascular disease, unspecified: Secondary | ICD-10-CM | POA: Diagnosis not present

## 2017-05-20 DIAGNOSIS — E1142 Type 2 diabetes mellitus with diabetic polyneuropathy: Secondary | ICD-10-CM | POA: Diagnosis not present

## 2017-05-20 DIAGNOSIS — E538 Deficiency of other specified B group vitamins: Secondary | ICD-10-CM | POA: Diagnosis not present

## 2017-06-09 DIAGNOSIS — S62316D Displaced fracture of base of fifth metacarpal bone, right hand, subsequent encounter for fracture with routine healing: Secondary | ICD-10-CM | POA: Diagnosis not present

## 2017-06-16 DIAGNOSIS — M1711 Unilateral primary osteoarthritis, right knee: Secondary | ICD-10-CM | POA: Diagnosis not present

## 2017-07-07 DIAGNOSIS — S62316D Displaced fracture of base of fifth metacarpal bone, right hand, subsequent encounter for fracture with routine healing: Secondary | ICD-10-CM | POA: Diagnosis not present

## 2017-07-26 DIAGNOSIS — H524 Presbyopia: Secondary | ICD-10-CM | POA: Diagnosis not present

## 2017-07-26 DIAGNOSIS — E119 Type 2 diabetes mellitus without complications: Secondary | ICD-10-CM | POA: Diagnosis not present

## 2017-07-26 DIAGNOSIS — H04123 Dry eye syndrome of bilateral lacrimal glands: Secondary | ICD-10-CM | POA: Diagnosis not present

## 2017-07-26 DIAGNOSIS — H26493 Other secondary cataract, bilateral: Secondary | ICD-10-CM | POA: Diagnosis not present

## 2017-08-05 ENCOUNTER — Other Ambulatory Visit: Payer: Self-pay

## 2017-08-05 ENCOUNTER — Ambulatory Visit: Payer: Medicare HMO | Admitting: Family

## 2017-08-05 ENCOUNTER — Encounter: Payer: Self-pay | Admitting: Family

## 2017-08-05 VITALS — BP 103/47 | HR 53 | Temp 97.9°F | Resp 20 | Ht 71.0 in | Wt 271.0 lb

## 2017-08-05 DIAGNOSIS — I6523 Occlusion and stenosis of bilateral carotid arteries: Secondary | ICD-10-CM | POA: Diagnosis not present

## 2017-08-05 DIAGNOSIS — I872 Venous insufficiency (chronic) (peripheral): Secondary | ICD-10-CM

## 2017-08-05 DIAGNOSIS — I739 Peripheral vascular disease, unspecified: Secondary | ICD-10-CM

## 2017-08-05 NOTE — Patient Instructions (Addendum)
Peripheral Vascular Disease Peripheral vascular disease (PVD) is a disease of the blood vessels that are not part of your heart and brain. A simple term for PVD is poor circulation. In most cases, PVD narrows the blood vessels that carry blood from your heart to the rest of your body. This can result in a decreased supply of blood to your arms, legs, and internal organs, like your stomach or kidneys. However, it most often affects a person's lower legs and feet. There are two types of PVD.  Organic PVD. This is the more common type. It is caused by damage to the structure of blood vessels.  Functional PVD. This is caused by conditions that make blood vessels contract and tighten (spasm).  Without treatment, PVD tends to get worse over time. PVD can also lead to acute ischemic limb. This is when an arm or limb suddenly has trouble getting enough blood. This is a medical emergency. Follow these instructions at home:  Take medicines only as told by your doctor.  Do not use any tobacco products, including cigarettes, chewing tobacco, or electronic cigarettes. If you need help quitting, ask your doctor.  Lose weight if you are overweight, and maintain a healthy weight as told by your doctor.  Eat a diet that is low in fat and cholesterol. If you need help, ask your doctor.  Exercise regularly. Ask your doctor for some good activities for you.  Take good care of your feet. ? Wear comfortable shoes that fit well. ? Check your feet often for any cuts or sores. Contact a doctor if:  You have cramps in your legs while walking.  You have leg pain when you are at rest.  You have coldness in a leg or foot.  Your skin changes.  You are unable to get or have an erection (erectile dysfunction).  You have cuts or sores on your feet that are not healing. Get help right away if:  Your arm or leg turns cold and blue.  Your arms or legs become red, warm, swollen, painful, or numb.  You have  chest pain or trouble breathing.  You suddenly have weakness in your face, arm, or leg.  You become very confused or you cannot speak.  You suddenly have a very bad headache.  You suddenly cannot see. This information is not intended to replace advice given to you by your health care provider. Make sure you discuss any questions you have with your health care provider. Document Released: 08/05/2009 Document Revised: 10/17/2015 Document Reviewed: 10/19/2013 Elsevier Interactive Patient Education  2017 Else[employer] Inc[employer]     Chronic Venous Insufficiency Chronic venous insufficiency, also called venous stasis, is a condition that prevents blood from being pumped effectively through the veins in your legs. Blood may no longer be pumped effectively from the legs back to the heart. This condition can range from mild to severe. With proper treatment, you should be able to continue with an active life. What are the causes? Chronic venous insufficiency occurs when the vein walls become stretched, weakened, or damaged, or when valves within the vein are damaged. Some common causes of this include:  High blood pressure inside the veins (venous hypertension).  Increased blood pressure in the leg veins from long periods of sitting or standing.  A blood clot that blocks blood flow in a vein (deep vein thrombosis, DVT).  Inflammation of a vein (phlebitis) that causes a blood clot to form.  Tumors in the pelvis that cause blood to  back up.  What increases the risk? The following factors may make you more likely to develop this condition:  Having a family history of this condition.  Obesity.  Pregnancy.  Living without enough physical activity or exercise (sedentary lifestyle).  Smoking.  Having a job that requires long periods of standing or sitting in one place.  Being a certain age. Women in their 23s and 20s and men in their 2s are more likely to develop this condition.  What are the  signs or symptoms? Symptoms of this condition include:  Veins that are enlarged, bulging, or twisted (varicose veins).  Skin breakdown or ulcers.  Reddened or discolored skin on the front of the leg.  Brown, smooth, tight, and painful skin just above the ankle, usually on the inside of the leg (lipodermatosclerosis).  Swelling.  How is this diagnosed? This condition may be diagnosed based on:  Your medical history.  A physical exam.  Tests, such as: ? A procedure that creates an image of a blood vessel and nearby organs and provides information about blood flow through the blood vessel (duplex ultrasound). ? A procedure that tests blood flow (plethysmography). ? A procedure to look at the veins using X-ray and dye (venogram).  How is this treated? The goals of treatment are to help you return to an active life and to minimize pain or disability. Treatment depends on the severity of your condition, and it may include:  Wearing compression stockings. These can help relieve symptoms and help prevent your condition from getting worse. However, they do not cure the condition.  Sclerotherapy. This is a procedure involving an injection of a material that "dissolves" damaged veins.  Surgery. This may involve: ? Removing a diseased vein (vein stripping). ? Cutting off blood flow through the vein (laser ablation surgery). ? Repairing a valve.  Follow these instructions at home:  Wear compression stockings as told by your health care provider. These stockings help to prevent blood clots and reduce swelling in your legs.  Take over-the-counter and prescription medicines only as told by your health care provider.  Stay active by exercising, walking, or doing different activities. Ask your health care provider what activities are safe for you and how much exercise you need.  Drink enough fluid to keep your urine clear or pale yellow.  Do not use any products that contain nicotine or  tobacco, such as cigarettes and e-cigarettes. If you need help quitting, ask your health care provider.  Keep all follow-up visits as told by your health care provider. This is important. Contact a health care provider if:  You have redness, swelling, or more pain in the affected area.  You see a red streak or line that extends up or down from the affected area.  You have skin breakdown or a loss of skin in the affected area, even if the breakdown is small.  You get an injury in the affected area. Get help right away if:  You get an injury and an open wound in the affected area.  You have severe pain that does not get better with medicine.  You have sudden numbness or weakness in the foot or ankle below the affected area, or you have trouble moving your foot or ankle.  You have a fever and you have worse or persistent symptoms.  You have chest pain.  You have shortness of breath. Summary  Chronic venous insufficiency, also called venous stasis, is a condition that prevents blood from being pumped  effectively through the veins in your legs.  Chronic venous insufficiency occurs when the vein walls become stretched, weakened, or damaged, or when valves within the vein are damaged.  Treatment for this condition depends on how severe your condition is, and it may involve wearing compression stockings or having a procedure.  Make sure you stay active by exercising, walking, or doing different activities. Ask your health care provider what activities are safe for you and how much exercise you need. This information is not intended to replace advice given to you by your health care provider. Make sure you discuss any questions you have with your health care provider. Document Released: 09/14/2006 Document Revised: 03/30/2016 Document Reviewed: 03/30/2016 Elsevier Interactive Patient Education  2017 [place].     To decrease swelling in your feet and legs: Elevate feet above  slightly bent knees, feet above heart, overnight and 3-4 times per day for 20 minutes.

## 2017-08-05 NOTE — Progress Notes (Signed)
[facility] [place] HISTORY AND PHYSICAL   CC: seeking vascular clearance for knee surgery    History of Present Illness:   [name] is a 77 y.o. male who has known peripheral vascular disease and carotid disease.  He is s/p right CEA and aorta-innominate graft on 09/09/11 and left CEA 04/08/11, aortobifemoral bypass grafting (2007).  The patient denies symptoms of TIA, amaurosis, or stroke.    He does continue to have balance and weakness/numbness with ambulation and weakness/numbness in his legs.  He stops to rest and he can go again.  No history of non healing wounds on B LE.   The patient is currently on Aspirin 325 mg antiplatelet therapy.   Other medical problems include DM, HTN, Hypercholesterolemia and CAD.  His health has been stable with out change   Pt was last evaluated by Dr. Oneida Alar and Jerilynn Mages. [facility] [employer] on 02-18-17. At that time patient's carotid occlusive disease was stable and asymptomatic. He had no significant carotid stenosis. He had decreased ABIs bilaterally but really had only minimal symptoms from this. He had some leg weakness but some of this also seems like neuropathy. He did not have any nonhealing wounds. Dr. Oneida Alar would only consider a revascularization procedure on him if he had nonhealing wounds and was at risk of limb loss due to the fact that he would require redo operation which would be high risk due to his obesity.  Fortunately his symptoms were minimal at that point. Patient was to follow-up in one year with repeat ABIs and carotid duplex scan. He was to return sooner if he develops any wounds on his feet or has any symptoms of TIA amaurosis or stroke.  Pt states he needs a partial knee replacement, right first, then left. Cortisone shot in his knee did not help.  He is seeing Dr. Mardelle Matte.  Wife states pt falls a lot, in November 2018 he fell and fractured a bone in his left 5th metatarsal.  Pt states he was evaluated by a  chiropractor, and states she told him that "no wonder he is falling, his brain has no idea where his feet are".   His last serum creatinine result on file was 1.3 on 06-28-14.   He has diarrhea for about a year.   He has chronic venous insufficiency with swelling in his lower legs, no ulcers.   He is here as he is having more trouble walking and needs a knee replacement, asking for Dr. Oneida Alar opinion re if he has adequate arterial perfusion for healing.    Diabetic: Yes, states his last A1C was 6.? Tobacco use: former smoker, quit in 2007, smoked x 40 years  Pt meds include: Statin :Yes Betablocker: Yes ASA: Yes Other anticoagulants/antiplatelets: no  Current Outpatient Medications  Medication Sig Dispense Refill  . aspirin 325 MG EC tablet Take 325 mg by mouth every evening.     Marland Kitchen atorvastatin (LIPITOR) 20 MG tablet Take 20 mg by mouth every evening.     Marland Kitchen b complex vitamins capsule Take 1 capsule by mouth daily.      . Cetirizine HCl (ZYRTEC ALLERGY PO) Take 10 tablets by mouth daily as needed (allergies). For allergies    . Cyanocobalamin (VITAMIN B 12 PO) Take 1 tablet by mouth every other day.    Marland Kitchen DM-Phenylephrine-Acetaminophen (VICKS DAYQUIL COLD & FLU) 10-5-325 MG/15ML LIQD Take by mouth.    . fenofibrate (TRICOR) 145 MG tablet Take 145 mg by mouth every  morning.    . furosemide (LASIX) 40 MG tablet Take 40 mg by mouth daily as needed for fluid or edema.    . gabapentin (NEURONTIN) 300 MG capsule Take two (2) capsules (600 mg total) by mouth every morning and take one (1) capsule (300 mg total) by mouth every evening. Can take and additional dose one (1) capsule (300 mg total) by mouth as needed for nerve pain.    Marland Kitchen levothyroxine (SYNTHROID, LEVOTHROID) 50 MCG tablet Take 50 mcg by mouth daily before breakfast.    . Loperamide HCl (IMODIUM PO) Take 2 mg by mouth daily as needed. For diarrhea    . methylcellulose (ARTIFICIAL TEARS) 1 % ophthalmic solution Place 1 drop into both  eyes 2 (two) times daily as needed (dry eyes).    . metoprolol (LOPRESSOR) 50 MG tablet Take 50 mg by mouth 2 (two) times daily.    . nitroGLYCERIN (NITROSTAT) 0.4 MG SL tablet PLACE 1 TABLET UNDER TONGUE EVERY 5 MINUTES AS NEEDED FOR CHEST PAIN. 75 tablet 1  . pioglitazone (ACTOS) 15 MG tablet Take 30 mg by mouth daily. Takes 2 15mg  QD    . potassium chloride SA (K-DUR,KLOR-CON) 20 MEQ tablet Take 20 mEq by mouth daily as needed (Take with lasix).    . ramipril (ALTACE) 10 MG capsule Take 10 mg by mouth daily.    . sodium chloride (OCEAN) 0.65 % SOLN nasal spray Place 1 spray into both nostrils 2 (two) times daily as needed for congestion.    . temazepam (RESTORIL) 15 MG capsule Take 15 mg by mouth at bedtime as needed. For sleep    . erythromycin ophthalmic ointment USE A SMALL AMOUNT IN THE LEFT EYE TID AS DIRECTED  2  . pseudoephedrine-guaifenesin (MUCINEX D) 60-600 MG per tablet Take 1 tablet by mouth every 12 (twelve) hours as needed for congestion. Congestion     No current facility-administered medications for this visit.     Past Medical History:  Diagnosis Date  . Arthritis   . Atherosclerotic heart disease   . Back pain   . Bruises easily    takes Pletal daily and ASA 325mg  daily  . Cataract immature    right  . Chest pain   . Colon polyps   . Cyst    on perineum;takes Doxycycline daily  . Diabetes mellitus    takes Glipizide,Metformin,and Actos daily  . Diarrhea   . Edema    right leg knee down swollen since fall 3 wks ago  . GERD (gastroesophageal reflux disease)    Rolaids as needed-occ reflux  . Hemorrhoids   . History of kidney stones    at age 86   . Hyperlipidemia    takes Tricor and Lipitor daily  . Hyperlipidemia   . Hypertension    takes Altace,Amlodipine,and Nadolol daily  . Kidney stone    35+yrs ago  . Muscle pain   . Nasal polyps    hx of  . Peripheral edema    wears knee length hose-told by podiatrist to wear these  . Peripheral neuropathy    . Peripheral neuropathy    takes Gabapentin daily  . Pneumonia    as a child  . PVD (peripheral vascular disease) ([place])   . Renal insufficiency   . Seasonal allergies    takes Mucinex and Zyrtec prn  . Urinary frequency    urgency/takes Vesicare daily    Social History Social History   Tobacco Use  . Smoking status: Former  Smoker    Years: 40.00    Types: Cigarettes    Last attempt to quit: 10/27/2005    Years since quitting: 11.7  . Smokeless tobacco: Never Used  Substance Use Topics  . Alcohol use: Yes    Alcohol/week: 0.0 oz    Types: 2 - 3 Cans of beer per week    Comment: occasional- weekly  . Drug use: No    Family History Family History  Problem Relation Age of Onset  . Heart disease Mother   . Heart disease Father   . Anesthesia problems Neg Hx   . Hypotension Neg Hx   . Malignant hyperthermia Neg Hx   . Pseudochol deficiency Neg Hx     Surgical History Past Surgical History:  Procedure Laterality Date  . Aortobifemoral bypass    . CARDIAC CATHETERIZATION  most recent 05/2011   total of 4  . CAROTID ANGIOGRAM N/A 06/05/2011   Procedure: CAROTID ANGIOGRAM;  Surgeon: Elam Dutch, MD;  Location: [facility] CATH LAB;  Service: Cardiovascular;  Laterality: N/A;  . CAROTID ENDARTERECTOMY  04/08/2011   left CEA  . cataract surgery     left  . COLONOSCOPY    . COLONOSCOPY WITH PROPOFOL N/A 08/22/2013   Procedure: COLONOSCOPY WITH PROPOFOL;  Surgeon: Garlan Fair, MD;  Location: [facility] ENDOSCOPY;  Service: Endoscopy;  Laterality: N/A;  . CORONARY ARTERY BYPASS GRAFT  09/09/2011   Procedure: CORONARY ARTERY BYPASS GRAFTING (CABG);  Surgeon: Gaye Pollack, MD;  Location: [place];  Service: Open Heart Surgery;  Laterality: N/A;  Coronary artery bypass grafting  x three with Right saphenous vein harvested endoscopically and left internal mammary artery  . ENDARTERECTOMY  04/08/2011   Procedure: ENDARTERECTOMY CAROTID;  Surgeon: Elam Dutch, MD;  Location: [facility] OR;   Service: Vascular;  Laterality: Left;  with patch angioplasty  . ENDARTERECTOMY  09/09/2011   Procedure: ENDARTERECTOMY CAROTID;  Surgeon: Elam Dutch, MD;  Location: [facility] OR;  Service: Vascular;  Laterality: Right;  with patch angioplasty  . KNEE ARTHROSCOPY  2011  . LEFT HEART CATHETERIZATION WITH CORONARY ANGIOGRAM N/A 07/09/2011   Procedure: LEFT HEART CATHETERIZATION WITH CORONARY ANGIOGRAM;  Surgeon: Sinclair Grooms, MD;  Location: [facility] CATH LAB;  Service: Cardiovascular;  Laterality: N/A;  . Reimplantation of inferior mesenteric artery    . Repair of infrarenal abdominal aortic aneurysm    . SHOULDER ARTHROSCOPY W/ ROTATOR CUFF REPAIR     right and left-open procedures  . TONSILLECTOMY     at age 39  . TRIGGER FINGER RELEASE Right 06/28/2014   Procedure: RIGHT LONG TRIGGER RELEASE ;  Surgeon: Leanora Cover, MD;  Location: [place];  Service: Orthopedics;  Laterality: Right;  . wisdom teeth extracted     as a teenager    Allergies  Allergen Reactions  . Amoxicillin-Pot Clavulanate Nausea Only    Augmentin     Current Outpatient Medications  Medication Sig Dispense Refill  . aspirin 325 MG EC tablet Take 325 mg by mouth every evening.     Marland Kitchen atorvastatin (LIPITOR) 20 MG tablet Take 20 mg by mouth every evening.     Marland Kitchen b complex vitamins capsule Take 1 capsule by mouth daily.      . Cetirizine HCl (ZYRTEC ALLERGY PO) Take 10 tablets by mouth daily as needed (allergies). For allergies    . Cyanocobalamin (VITAMIN B 12 PO) Take 1 tablet by mouth every other day.    Marland Kitchen DM-Phenylephrine-Acetaminophen (VICKS  DAYQUIL COLD & FLU) 10-5-325 MG/15ML LIQD Take by mouth.    . fenofibrate (TRICOR) 145 MG tablet Take 145 mg by mouth every morning.    . furosemide (LASIX) 40 MG tablet Take 40 mg by mouth daily as needed for fluid or edema.    . gabapentin (NEURONTIN) 300 MG capsule Take two (2) capsules (600 mg total) by mouth every morning and take one (1) capsule (300 mg total) by  mouth every evening. Can take and additional dose one (1) capsule (300 mg total) by mouth as needed for nerve pain.    Marland Kitchen levothyroxine (SYNTHROID, LEVOTHROID) 50 MCG tablet Take 50 mcg by mouth daily before breakfast.    . Loperamide HCl (IMODIUM PO) Take 2 mg by mouth daily as needed. For diarrhea    . methylcellulose (ARTIFICIAL TEARS) 1 % ophthalmic solution Place 1 drop into both eyes 2 (two) times daily as needed (dry eyes).    . metoprolol (LOPRESSOR) 50 MG tablet Take 50 mg by mouth 2 (two) times daily.    . nitroGLYCERIN (NITROSTAT) 0.4 MG SL tablet PLACE 1 TABLET UNDER TONGUE EVERY 5 MINUTES AS NEEDED FOR CHEST PAIN. 75 tablet 1  . pioglitazone (ACTOS) 15 MG tablet Take 30 mg by mouth daily. Takes 2 15mg  QD    . potassium chloride SA (K-DUR,KLOR-CON) 20 MEQ tablet Take 20 mEq by mouth daily as needed (Take with lasix).    . ramipril (ALTACE) 10 MG capsule Take 10 mg by mouth daily.    . sodium chloride (OCEAN) 0.65 % SOLN nasal spray Place 1 spray into both nostrils 2 (two) times daily as needed for congestion.    . temazepam (RESTORIL) 15 MG capsule Take 15 mg by mouth at bedtime as needed. For sleep    . erythromycin ophthalmic ointment USE A SMALL AMOUNT IN THE LEFT EYE TID AS DIRECTED  2  . pseudoephedrine-guaifenesin (MUCINEX D) 60-600 MG per tablet Take 1 tablet by mouth every 12 (twelve) hours as needed for congestion. Congestion     No current facility-administered medications for this visit.      REVIEW OF SYSTEMS: See HPI for pertinent positives and negatives.  Physical Examination Vitals:   08/05/17 1525 08/05/17 1529  BP: 119/60 (!) 103/47  Pulse: (!) 53   Resp: 20   Temp: 97.9 F (36.6 C)   TempSrc: Oral   SpO2: 96%   Weight: 271 lb (122.9 kg)   Height: 5\' 11"  (1.803 m)    Body mass index is 37.8 kg/m.  General:  WDWN obese male in NAD Gait: slow, using walker HENT: Large neck Eyes: PERRLA Pulmonary: normal non-labored breathing, distant breath sounds in  all fields, CTAB, no rales, rhonchi, or wheezing Cardiac: RRR, + low grade murmur Abdomen: soft, NT, no masses palpated, large panus Skin: no rashes, no ulcers, no cellulitis.   VASCULAR EXAM  Carotid Bruits Right Left   Negative Negative      Radial pulses are 1+ palpable bilaterally   Adominal aortic pulse is not palpable                      VASCULAR EXAM: Extremities without ischemic changes, without Gangrene; without open wounds.Bilateral pretibial pitting edema: 1+ right, trace left.  LE Pulses Right Left       FEMORAL  not palpable, obese, large panus  not palpable        POPLITEAL  not palpable   not palpable       POSTERIOR TIBIAL  not palpable   not palpable        DORSALIS PEDIS      ANTERIOR TIBIAL not palpable  not palpable     Musculoskeletal: no muscle wasting or atrophy. M/S 4/5 throughout.  Neurologic:  A&O X 3; appropriate affect, sensation is diminished in feet; speech is normal, CN 2-12 intact, motor exam as listed above. Psychiatric: Normal thought content, mood appropriate to clinical situation.    ASSESSMENT:  [name] is a 77 y.o. male who has known peripheral vascular disease and carotid disease.  He is s/p right CEA and aorta-innominate graft on 09/09/11 and left CEA 04/08/11, aortobifemoral bypass grafting (2007).   He is contemplating knee surgery for painful knees, and wants Dr. Oneida Alar opinion re the adequacy of perfusion to his knees to heal. Dr. Oneida Alar advised pt that it appears that he has adequate perfusion to his superficial femoral arteries bilaterally, and should be able to heal adequately from knee surgery, but it is the pt's decision considering his other co morbidities and how much his knees bother him.  The patient denies symptoms of TIA, amaurosis, or stroke.    Dr. Oneida Alar spoke with pt and wife and examined pt.    DATA  None today  ABI (Date: 02-18-17):  R:   ABI: 0.48 (0.61),   PT: waveform morphology not documented, appears monophasic  DP: waveform morphology not documented, appears monophasic   TBI:  0.29  L:   ABI: 0.25 (was 0.27),   PT: waveform morphology not documented, appears monophasic  DP: waveform morphology not documented, appears monophasic   TBI: 0.00   PLAN:   Referral to [facility] to evaluate and help manage peripheral neuropathy.  Based on today's exam and non-invasive vascular lab results, the patient will follow up in 6 months with the following tests: ABI's. Carotid duplex in a year.   I discussed in depth with the patient the nature of atherosclerosis, and emphasized the importance of maximal medical management including strict control of blood pressure, blood glucose, and lipid levels, obtaining regular exercise, and cessation of smoking.  The patient is aware that without maximal medical management the underlying atherosclerotic disease process will progress, limiting the benefit of any interventions.  The patient was given information about stroke prevention and what symptoms should prompt the patient to seek immediate medical care.  The patient was given information about PAD including signs, symptoms, treatment, what symptoms should prompt the patient to seek immediate medical care, and risk reduction measures to take.  Thank you for allowing [place] to participate in this patient's care.  Clemon Chambers, RN, MSN, FNP-C [facility] Office: [phone]  Clinic MD: Detar [place] 08/05/2017 3:41 PM

## 2017-08-11 ENCOUNTER — Emergency Department (HOSPITAL_COMMUNITY): Payer: Medicare HMO

## 2017-08-11 ENCOUNTER — Other Ambulatory Visit: Payer: Self-pay

## 2017-08-11 ENCOUNTER — Encounter (HOSPITAL_COMMUNITY): Payer: Self-pay

## 2017-08-11 ENCOUNTER — Emergency Department (HOSPITAL_COMMUNITY)
Admission: EM | Admit: 2017-08-11 | Discharge: 2017-08-12 | Disposition: A | Discharge status: Home / Self Care | Payer: Medicare HMO | Attending: Emergency Medicine | Admitting: Emergency Medicine

## 2017-08-11 DIAGNOSIS — W010XXA Fall on same level from slipping, tripping and stumbling without subsequent striking against object, initial encounter: Secondary | ICD-10-CM | POA: Insufficient documentation

## 2017-08-11 DIAGNOSIS — Y998 Other external cause status: Secondary | ICD-10-CM | POA: Diagnosis not present

## 2017-08-11 DIAGNOSIS — Z7984 Long term (current) use of oral hypoglycemic drugs: Secondary | ICD-10-CM | POA: Diagnosis not present

## 2017-08-11 DIAGNOSIS — E1140 Type 2 diabetes mellitus with diabetic neuropathy, unspecified: Secondary | ICD-10-CM | POA: Diagnosis not present

## 2017-08-11 DIAGNOSIS — Y92512 Supermarket, store or market as the place of occurrence of the external cause: Secondary | ICD-10-CM | POA: Diagnosis not present

## 2017-08-11 DIAGNOSIS — Z79899 Other long term (current) drug therapy: Secondary | ICD-10-CM | POA: Insufficient documentation

## 2017-08-11 DIAGNOSIS — S62316A Displaced fracture of base of fifth metacarpal bone, right hand, initial encounter for closed fracture: Secondary | ICD-10-CM | POA: Diagnosis not present

## 2017-08-11 DIAGNOSIS — Z87891 Personal history of nicotine dependence: Secondary | ICD-10-CM | POA: Diagnosis not present

## 2017-08-11 DIAGNOSIS — M25572 Pain in left ankle and joints of left foot: Secondary | ICD-10-CM | POA: Diagnosis not present

## 2017-08-11 DIAGNOSIS — S0590XA Unspecified injury of unspecified eye and orbit, initial encounter: Secondary | ICD-10-CM

## 2017-08-11 DIAGNOSIS — Z951 Presence of aortocoronary bypass graft: Secondary | ICD-10-CM | POA: Diagnosis not present

## 2017-08-11 DIAGNOSIS — E785 Hyperlipidemia, unspecified: Secondary | ICD-10-CM | POA: Insufficient documentation

## 2017-08-11 DIAGNOSIS — S0592XA Unspecified injury of left eye and orbit, initial encounter: Secondary | ICD-10-CM | POA: Diagnosis not present

## 2017-08-11 DIAGNOSIS — M25532 Pain in left wrist: Secondary | ICD-10-CM | POA: Diagnosis not present

## 2017-08-11 DIAGNOSIS — Z7982 Long term (current) use of aspirin: Secondary | ICD-10-CM | POA: Insufficient documentation

## 2017-08-11 DIAGNOSIS — Z86718 Personal history of other venous thrombosis and embolism: Secondary | ICD-10-CM | POA: Diagnosis not present

## 2017-08-11 DIAGNOSIS — I739 Peripheral vascular disease, unspecified: Secondary | ICD-10-CM

## 2017-08-11 DIAGNOSIS — S3992XA Unspecified injury of lower back, initial encounter: Secondary | ICD-10-CM | POA: Diagnosis not present

## 2017-08-11 DIAGNOSIS — R51 Headache: Secondary | ICD-10-CM | POA: Diagnosis not present

## 2017-08-11 DIAGNOSIS — I5032 Chronic diastolic (congestive) heart failure: Secondary | ICD-10-CM | POA: Insufficient documentation

## 2017-08-11 DIAGNOSIS — S299XXA Unspecified injury of thorax, initial encounter: Secondary | ICD-10-CM | POA: Diagnosis not present

## 2017-08-11 DIAGNOSIS — S0993XA Unspecified injury of face, initial encounter: Secondary | ICD-10-CM | POA: Insufficient documentation

## 2017-08-11 DIAGNOSIS — N183 Chronic kidney disease, stage 3 (moderate): Secondary | ICD-10-CM | POA: Diagnosis not present

## 2017-08-11 DIAGNOSIS — Y939 Activity, unspecified: Secondary | ICD-10-CM | POA: Insufficient documentation

## 2017-08-11 DIAGNOSIS — I130 Hypertensive heart and chronic kidney disease with heart failure and stage 1 through stage 4 chronic kidney disease, or unspecified chronic kidney disease: Secondary | ICD-10-CM | POA: Insufficient documentation

## 2017-08-11 DIAGNOSIS — M1711 Unilateral primary osteoarthritis, right knee: Secondary | ICD-10-CM | POA: Diagnosis not present

## 2017-08-11 DIAGNOSIS — M545 Low back pain: Secondary | ICD-10-CM | POA: Diagnosis not present

## 2017-08-11 DIAGNOSIS — S0231XA Fracture of orbital floor, right side, initial encounter for closed fracture: Secondary | ICD-10-CM | POA: Diagnosis not present

## 2017-08-11 DIAGNOSIS — S6992XA Unspecified injury of left wrist, hand and finger(s), initial encounter: Secondary | ICD-10-CM | POA: Diagnosis not present

## 2017-08-11 DIAGNOSIS — S0990XA Unspecified injury of head, initial encounter: Secondary | ICD-10-CM | POA: Diagnosis present

## 2017-08-11 DIAGNOSIS — M5489 Other dorsalgia: Secondary | ICD-10-CM | POA: Diagnosis not present

## 2017-08-11 DIAGNOSIS — S62324A Displaced fracture of shaft of fourth metacarpal bone, right hand, initial encounter for closed fracture: Secondary | ICD-10-CM | POA: Diagnosis not present

## 2017-08-11 LAB — I-STAT CHEM 8, ED
BUN: 52 mg/dL — ABNORMAL HIGH (ref 6–20)
CALCIUM ION: 1.18 mmol/L (ref 1.15–1.40)
CREATININE: 2.1 mg/dL — AB (ref 0.61–1.24)
Chloride: 102 mmol/L (ref 101–111)
GLUCOSE: 136 mg/dL — AB (ref 65–99)
HEMATOCRIT: 37 % — AB (ref 39.0–52.0)
HEMOGLOBIN: 12.6 g/dL — AB (ref 13.0–17.0)
Potassium: 4.5 mmol/L (ref 3.5–5.1)
Sodium: 137 mmol/L (ref 135–145)
TCO2: 26 mmol/L (ref 22–32)

## 2017-08-11 MED ORDER — MORPHINE SULFATE 15 MG PO TABS: 15.0000 mg | ORAL_TABLET | ORAL | 0 refills | Status: DC | PRN

## 2017-08-11 MED ORDER — TETRACAINE HCL 0.5 % OP SOLN
2.0000 [drp] | Freq: Once | OPHTHALMIC | Status: AC
  Administered 2017-08-11: 2 [drp] via OPHTHALMIC
  Filled 2017-08-11: qty 4

## 2017-08-11 MED ORDER — FLUORESCEIN SODIUM 1 MG OP STRP
1.0000 | ORAL_STRIP | Freq: Once | OPHTHALMIC | Status: AC
  Administered 2017-08-11: 1 via OPHTHALMIC
  Filled 2017-08-11: qty 1

## 2017-08-11 MED ORDER — OXYCODONE HCL 5 MG PO TABS
5.0000 mg | ORAL_TABLET | Freq: Once | ORAL | Status: AC
  Administered 2017-08-11: 5 mg via ORAL
  Filled 2017-08-11: qty 1

## 2017-08-11 MED ORDER — ACETAMINOPHEN 500 MG PO TABS
1000.0000 mg | ORAL_TABLET | Freq: Once | ORAL | Status: AC
Start: 2017-08-11 — End: 2017-08-11
  Administered 2017-08-11: 1000 mg via ORAL
  Filled 2017-08-11: qty 2

## 2017-08-11 NOTE — ED Notes (Signed)
Patient transported to X-ray 

## 2017-08-11 NOTE — ED Provider Notes (Signed)
[facility] EMERGENCY DEPARTMENT Provider Note   CSN: [id number] Arrival date & time: 08/11/17  1627     History   Chief Complaint No chief complaint on file.   HPI [name] is a 77 y.o. male.  77 yo M with a chief complaint of a fall from standing.  The patient is unsure what happened.  He said he was going to the handicap area to walk into a store and then found himself on the ground.  His wife went for a normal direction and so she turned around and found him on the ground.  He is complaining of pain to the right side of his face as well as to bilateral wrist.  He denies chest pain or shortness of breath denies abdominal pain denies nausea vomiting or diarrhea.  Patient had been well prior to this event.   The history is provided by the patient.  Injury  This is a new problem. The current episode started less than 1 hour ago. The problem occurs constantly. The problem has been resolved. Associated symptoms include headaches. Pertinent negatives include no chest pain, no abdominal pain and no shortness of breath. Nothing aggravates the symptoms. Nothing relieves the symptoms. He has tried nothing for the symptoms. The treatment provided no relief.    Past Medical History:  Diagnosis Date  . Arthritis   . Atherosclerotic heart disease   . Back pain   . Bruises easily    takes Pletal daily and ASA 325mg  daily  . Cataract immature    right  . Chest pain   . Colon polyps   . Cyst    on perineum;takes Doxycycline daily  . Diabetes mellitus    takes Glipizide,Metformin,and Actos daily  . Diarrhea   . Edema    right leg knee down swollen since fall 3 wks ago  . GERD (gastroesophageal reflux disease)    Rolaids as needed-occ reflux  . Hemorrhoids   . History of kidney stones    at age 39   . Hyperlipidemia    takes Tricor and Lipitor daily  . Hyperlipidemia   . Hypertension    takes Altace,Amlodipine,and Nadolol daily  . Kidney stone    35+yrs  ago  . Muscle pain   . Nasal polyps    hx of  . Peripheral edema    wears knee length hose-told by podiatrist to wear these  . Peripheral neuropathy   . Peripheral neuropathy    takes Gabapentin daily  . Pneumonia    as a child  . PVD (peripheral vascular disease) ([place])   . Renal insufficiency   . Seasonal allergies    takes Mucinex and Zyrtec prn  . Urinary frequency    urgency/takes Vesicare daily    Patient Active Problem List   Diagnosis Date Noted  . Essential hypertension 02/06/2014  . Chronic diastolic heart failure ([place]) 02/06/2014  . Chronic kidney disease, stage III (moderate) ([place]) 02/06/2014  . Aftercare following surgery of the circulatory system, [place] 01/25/2014  . Carotid stenosis 06/08/2013  . Stricture of artery ([place]) 04/14/2012  . Peripheral vascular disease, unspecified ([place]) 10/08/2011  . DVT of upper extremity (deep vein thrombosis), left 09/29/2011  . S/P CABG (coronary artery bypass graft) 09/29/2011  . H/O carotid endarterectomy 09/29/2011  . Occlusion and stenosis of carotid artery without mention of cerebral infarction 06/11/2011  . Diabetes mellitus type 2 with complications ([place]) 99/24/2683    Past Surgical History:  Procedure Laterality Date  .  Aortobifemoral bypass    . CARDIAC CATHETERIZATION  most recent 05/2011   total of 4  . CAROTID ANGIOGRAM N/A 06/05/2011   Procedure: CAROTID ANGIOGRAM;  Surgeon: Elam Dutch, MD;  Location: [facility] CATH LAB;  Service: Cardiovascular;  Laterality: N/A;  . CAROTID ENDARTERECTOMY  04/08/2011   left CEA  . cataract surgery     left  . COLONOSCOPY    . COLONOSCOPY WITH PROPOFOL N/A 08/22/2013   Procedure: COLONOSCOPY WITH PROPOFOL;  Surgeon: Garlan Fair, MD;  Location: [facility] ENDOSCOPY;  Service: Endoscopy;  Laterality: N/A;  . CORONARY ARTERY BYPASS GRAFT  09/09/2011   Procedure: CORONARY ARTERY BYPASS GRAFTING (CABG);  Surgeon: Gaye Pollack, MD;  Location: [facility];  Service: Open Heart Surgery;   Laterality: N/A;  Coronary artery bypass grafting  x three with Right saphenous vein harvested endoscopically and left internal mammary artery  . ENDARTERECTOMY  04/08/2011   Procedure: ENDARTERECTOMY CAROTID;  Surgeon: Elam Dutch, MD;  Location: [facility] OR;  Service: Vascular;  Laterality: Left;  with patch angioplasty  . ENDARTERECTOMY  09/09/2011   Procedure: ENDARTERECTOMY CAROTID;  Surgeon: Elam Dutch, MD;  Location: [facility] OR;  Service: Vascular;  Laterality: Right;  with patch angioplasty  . KNEE ARTHROSCOPY  2011  . LEFT HEART CATHETERIZATION WITH CORONARY ANGIOGRAM N/A 07/09/2011   Procedure: LEFT HEART CATHETERIZATION WITH CORONARY ANGIOGRAM;  Surgeon: Sinclair Grooms, MD;  Location: [facility] CATH LAB;  Service: Cardiovascular;  Laterality: N/A;  . Reimplantation of inferior mesenteric artery    . Repair of infrarenal abdominal aortic aneurysm    . SHOULDER ARTHROSCOPY W/ ROTATOR CUFF REPAIR     right and left-open procedures  . TONSILLECTOMY     at age 37  . TRIGGER FINGER RELEASE Right 06/28/2014   Procedure: RIGHT LONG TRIGGER RELEASE ;  Surgeon: Leanora Cover, MD;  Location: [facility];  Service: Orthopedics;  Laterality: Right;  . wisdom teeth extracted     as a teenager       Home Medications    Prior to Admission medications   Medication Sig Start Date End Date Taking? Authorizing Provider  aspirin 325 MG EC tablet Take 325 mg by mouth every evening.     [provider]  atorvastatin (LIPITOR) 20 MG tablet Take 20 mg by mouth every evening.     [provider]  b complex vitamins capsule Take 1 capsule by mouth daily.      [provider]  Cetirizine HCl (ZYRTEC ALLERGY PO) Take 10 tablets by mouth daily as needed (allergies). For allergies    [provider]  Cyanocobalamin (VITAMIN B 12 PO) Take 1 tablet by mouth every other day.    [provider]  DM-Phenylephrine-Acetaminophen (VICKS DAYQUIL COLD & FLU) 10-5-325  MG/15ML LIQD Take by mouth.    [provider]  erythromycin ophthalmic ointment USE A SMALL AMOUNT IN THE LEFT EYE TID AS DIRECTED 12/31/14   [provider]  fenofibrate (TRICOR) 145 MG tablet Take 145 mg by mouth every morning.    [provider]  furosemide (LASIX) 40 MG tablet Take 40 mg by mouth daily as needed for fluid or edema.    [provider]  gabapentin (NEURONTIN) 300 MG capsule Take two (2) capsules (600 mg total) by mouth every morning and take one (1) capsule (300 mg total) by mouth every evening. Can take and additional dose one (1) capsule (300 mg total) by mouth as needed  for nerve pain.    [provider]  levothyroxine (SYNTHROID, LEVOTHROID) 50 MCG tablet Take 50 mcg by mouth daily before breakfast.    [provider]  Loperamide HCl (IMODIUM PO) Take 2 mg by mouth daily as needed. For diarrhea    [provider]  methylcellulose (ARTIFICIAL TEARS) 1 % ophthalmic solution Place 1 drop into both eyes 2 (two) times daily as needed (dry eyes).    [provider]  metoprolol (LOPRESSOR) 50 MG tablet Take 50 mg by mouth 2 (two) times daily.    [provider]  nitroGLYCERIN (NITROSTAT) 0.4 MG SL tablet PLACE 1 TABLET UNDER TONGUE EVERY 5 MINUTES AS NEEDED FOR CHEST PAIN. 09/17/15   Belva Crome, MD  pioglitazone (ACTOS) 15 MG tablet Take 30 mg by mouth daily. Takes 2 15mg  QD    [provider]  potassium chloride SA (K-DUR,KLOR-CON) 20 MEQ tablet Take 20 mEq by mouth daily as needed (Take with lasix).    [provider]  pseudoephedrine-guaifenesin (MUCINEX D) 60-600 MG per tablet Take 1 tablet by mouth every 12 (twelve) hours as needed for congestion. Congestion    [provider]  ramipril (ALTACE) 10 MG capsule Take 10 mg by mouth daily.    [provider]  sodium chloride (OCEAN) 0.65 % SOLN nasal spray Place 1 spray into both nostrils 2 (two) times daily as needed  for congestion.    [provider]  temazepam (RESTORIL) 15 MG capsule Take 15 mg by mouth at bedtime as needed. For sleep    [provider]    Family History Family History  Problem Relation Age of Onset  . Heart disease Mother   . Heart disease Father   . Anesthesia problems Neg Hx   . Hypotension Neg Hx   . Malignant hyperthermia Neg Hx   . Pseudochol deficiency Neg Hx     Social History Social History   Tobacco Use  . Smoking status: Former Smoker    Years: 40.00    Types: Cigarettes    Last attempt to quit: 10/27/2005    Years since quitting: 11.7  . Smokeless tobacco: Never Used  Substance Use Topics  . Alcohol use: Yes    Alcohol/week: 0.0 oz    Types: 2 - 3 Cans of beer per week    Comment: occasional- weekly  . Drug use: No     Allergies   Amoxicillin-pot clavulanate   Review of Systems Review of Systems  Constitutional: Negative for chills and fever.  HENT: Negative for congestion and facial swelling.   Eyes: Negative for discharge and visual disturbance.  Respiratory: Negative for shortness of breath.   Cardiovascular: Negative for chest pain and palpitations.  Gastrointestinal: Negative for abdominal pain, diarrhea and vomiting.  Musculoskeletal: Positive for arthralgias and myalgias.  Skin: Negative for color change and rash.  Neurological: Positive for headaches. Negative for tremors and syncope.  Psychiatric/Behavioral: Negative for confusion and dysphoric mood.     Physical Exam Updated Vital Signs BP (!) 155/43   Pulse (!) 52   Resp 17   Ht 5\' 11"  (1.803 m)   Wt 121.1 kg (267 lb)   SpO2 94%   BMI 37.24 kg/m   Physical Exam  Constitutional: He is oriented to person, place, and time. He appears well-developed and well-nourished.  HENT:  Head: Normocephalic and atraumatic.  Eyes: EOM are normal. Pupils are equal, round, and reactive to light.  Neck: Normal range of motion. Neck  supple. No JVD present.    Cardiovascular: Normal rate and regular rhythm. Exam reveals no gallop and no friction rub.  No murmur heard. Pulmonary/Chest: No respiratory distress. He has no wheezes. He exhibits no tenderness.  Abdominal: He exhibits no distension and no mass. There is no tenderness. There is no rebound and no guarding.  Musculoskeletal: Normal range of motion. He exhibits tenderness.  Mild tenderness to bilateral wrists.  Worse to the lateral aspect of the right wrist and at the base of the fifth metatarsal.  No noted swelling no breaks in the skin.  Pulse motor and sensation is intact to the right upper extremity.  No midline spinal tenderness able to rotate his head 45 degrees in either direction.  Neurological: He is alert and oriented to person, place, and time.  Skin: No rash noted. No pallor.  Psychiatric: He has a normal mood and affect. His behavior is normal.  Nursing note and vitals reviewed.    ED Treatments / Results  Labs (all labs ordered are listed, but only abnormal results are displayed) Labs Reviewed  I-STAT CHEM 8, ED - Abnormal; Notable for the following components:      Result Value   BUN 52 (*)    Creatinine, Ser 2.10 (*)    Glucose, Bld 136 (*)    Hemoglobin 12.6 (*)    HCT 37.0 (*)    All other components within normal limits    EKG  EKG Interpretation None       Radiology Dg Wrist Complete Left  Result Date: 08/11/2017 CLINICAL DATA:  Fall with wrist pain EXAM: LEFT WRIST - COMPLETE 3+ VIEW COMPARISON:  None. FINDINGS: No acute displaced fracture or malalignment. Vascular calcifications in the soft tissues. IMPRESSION: No acute osseous abnormality. Electronically Signed   By: Donavan Foil M.D.   On: 08/11/2017 18:05   Dg Wrist Complete Right  Result Date: 08/11/2017 CLINICAL DATA:  Wrist pain history of fall EXAM: RIGHT WRIST - COMPLETE 3+ VIEW COMPARISON:  03/09/2011 FINDINGS: No acute fracture or malalignment at the carpal bones. Subacute fracture proximal  fifth metacarpal with about 1/3 shaft diameter of ulnar and dorsal displacement of distal fracture fragment. Periosteal new bone formation evident. Vascular calcifications in the soft tissues. IMPRESSION: 1. Subacute displaced fracture proximal fifth metacarpal 2. No acute osseous abnormality of the carpal bones Electronically Signed   By: Donavan Foil M.D.   On: 08/11/2017 18:05   Dg Ankle Complete Left  Result Date: 08/11/2017 CLINICAL DATA:  Onset of left ankle pain today while walking. No known injury. EXAM: LEFT ANKLE COMPLETE - 3+ VIEW COMPARISON:  None. FINDINGS: There is no evidence of fracture, dislocation, or joint effusion. There is no evidence of arthropathy or other focal bone abnormality. Soft tissues are unremarkable. IMPRESSION: Negative exam. Electronically Signed   By: Inge Rise M.D.   On: 08/11/2017 20:09   Ct Head Wo Contrast  Result Date: 08/11/2017 CLINICAL DATA:  The patient suffered a blow to the right eye with onset of pain, swelling and bruising due to a trip and fall over a walker today. Initial encounter. EXAM: CT HEAD WITHOUT CONTRAST CT MAXILLOFACIAL WITHOUT CONTRAST TECHNIQUE: Multidetector CT imaging of the head and maxillofacial structures were performed using the standard protocol without intravenous contrast. Multiplanar CT image reconstructions of the maxillofacial structures were also generated. COMPARISON:  Head CT scan 09/16/2011. FINDINGS: CT HEAD FINDINGS Brain: No evidence of acute infarction, hemorrhage, hydrocephalus, extra-axial collection or mass lesion/mass effect. Cortical atrophy  and chronic microvascular ischemic change noted. Vascular: Atherosclerosis is identified. Skull: Intact. Other: None. CT MAXILLOFACIAL FINDINGS Osseous: The patient has an acute, depressed fracture of the medial wall of the right orbit. Remote fracture of the medial wall of the left orbit is noted and was present on the prior CT head. No other fracture is identified.  Mandibular condyles are located. Orbits: There is mild proptosis on the right. The globes are intact bilaterally. The patient is status post bilateral lens extraction. Sinuses: Short air-fluid level is seen in the right maxillary sinus. There is opacification of the right ethmoid air cells. The right sphenoid sinus is nearly completely opacified with some calcifications present and wall thickening. Soft tissues: Hematoma about the right eye is identified. IMPRESSION: Acute fracture of the medial wall of the right orbit. Associated mild right proptosis and soft tissue contusion about the right eye noted. No other acute finding. Atrophy and chronic microvascular ischemic change. Atherosclerosis. Remote fracture medial wall left orbit. Chronic right sphenoid sinus disease. Electronically Signed   By: Inge Rise M.D.   On: 08/11/2017 18:03   Mr Thoracic Spine Wo Contrast  Result Date: 08/11/2017 CLINICAL DATA:  Initial evaluation for acute mid and lower back pain status post fall. EXAM: MRI THORACIC AND LUMBAR SPINE WITHOUT CONTRAST TECHNIQUE: Multiplanar and multiecho pulse sequences of the thoracic and lumbar spine were obtained without intravenous contrast. COMPARISON:  None. FINDINGS: MRI THORACIC SPINE FINDINGS Alignment: Mild straightening of the normal mid thoracic kyphosis. Trace degenerative anterolisthesis of T3 on T4. No other significant listhesis or malalignment. Vertebrae: Vertebral body heights maintained without evidence for acute or chronic fracture. Scattered chronic endplate Schmorl's nodes present within the mid and lower thoracic spine. Chronic reactive endplate changes present about the T11-12 interspace anteriorly. Underlying bone marrow signal intensity within normal limits. No discrete or worrisome osseous lesions. Cord: Signal intensity within the thoracic spinal cord is normal. Conus medullaris terminates inferior to the T12 level. Paraspinal and other soft tissues: Paraspinous soft  tissues demonstrate no acute abnormality. Visualized visceral structures grossly unremarkable. Small layering bilateral pleural effusions. Partially visualized lungs are otherwise grossly clear. Disc levels: T3-4:  Minimal disc bulge.  No stenosis. T5-6: Small central disc protrusion indents the ventral thecal sac. No significant stenosis. T5-6: Mild diffuse disc bulge.  No significant stenosis. T7-8: Left paracentral disc protrusion indents the ventral thecal sac with mild flattening the left hemi cord. No significant stenosis. T8-9: Mild diffuse disc bulge.  No significant stenosis. T9-10: Mild diffuse disc bulge. Mild facet hypertrophy. Borderline mild spinal stenosis. Mild right neural foraminal narrowing. MRI LUMBAR SPINE FINDINGS Segmentation: Normal segmentation. Lowest well-formed disc labeled the L5-S1 level. Alignment: Trace 3 mm retrolisthesis of L5 on S1. Vertebral bodies otherwise normally aligned. Vertebrae: Vertebral body heights are maintained without evidence for acute or chronic fracture. Prominent endplate Schmorl's nodes noted about the L2-3 interspace with associated mild reactive marrow edema. Chronic endplate reactive changes present at L5-S1. Underlying bone marrow signal intensity within normal limits. No discrete or worrisome osseous lesions. Conus medullaris and cauda equina: Conus extends to the L1 level. Conus and cauda equina appear normal. Paraspinal and other soft tissues: Paraspinous soft tissues demonstrate no acute abnormality. Subcentimeter T2 hyperintense cyst noted within the right kidney. Kidneys are somewhat atrophic bilaterally. Aneurysmal dilatation of the infrarenal aorta up to 3 cm noted. Disc levels: L1-2: Minimal disc bulge. Mild bilateral facet hypertrophy. No stenosis. L2-3: Mild diffuse disc bulge with disc desiccation. Mild to moderate facet and  ligament flavum hypertrophy. Trace bilateral joint effusions. Mild canal with moderate bilateral subarticular stenosis.  Mild bilateral L2 foraminal narrowing. L3-4: Mild diffuse disc bulge with disc desiccation. Moderate facet and ligament flavum hypertrophy. Resultant moderate spinal stenosis. Mild bilateral L3 foraminal narrowing. L4-5: Mild diffuse disc bulge. Moderate facet and ligament flavum hypertrophy. Resultant mild bilateral lateral recess narrowing without significant canal stenosis. Mild bilateral L4 foraminal narrowing. L5-S1: Chronic intervertebral disc space narrowing with diffuse disc bulge and reactive endplate changes. Left greater than right facet hypertrophy. No significant canal stenosis. Mild left L5 foraminal narrowing. IMPRESSION: MR THORACIC SPINE IMPRESSION 1. No acute traumatic injury within the thoracic spine. 2. Mild multilevel degenerative disc bulging at T3-4 through T9-10 as above without significant stenosis. 3. Small layering bilateral pleural effusions. MR LUMBAR SPINE IMPRESSION 1. No acute traumatic injury within the lumbar spine. 2. Multilevel degenerative spondylolysis with resultant mild to moderate canal and lateral recess stenosis at L2-3 and L3-4. Mild bilateral foraminal narrowing at L2 through L4. 3. **An incidental finding of potential clinical significance has been found. Aneurysmal dilatation of the infrarenal aorta up to 3 cm. Recommend followup by ultrasound in 3 years. This recommendation follows ACR consensus guidelines: White Paper of the ACR Incidental Findings Committee II on Vascular Findings. Joellyn Rued Radiol 2013; [id number]:[id number]** Electronically Signed   By: Jeannine Boga M.D.   On: 08/11/2017 23:47   Mr Lumbar Spine Wo Contrast  Result Date: 08/11/2017 CLINICAL DATA:  Initial evaluation for acute mid and lower back pain status post fall. EXAM: MRI THORACIC AND LUMBAR SPINE WITHOUT CONTRAST TECHNIQUE: Multiplanar and multiecho pulse sequences of the thoracic and lumbar spine were obtained without intravenous contrast. COMPARISON:  None. FINDINGS: MRI THORACIC SPINE  FINDINGS Alignment: Mild straightening of the normal mid thoracic kyphosis. Trace degenerative anterolisthesis of T3 on T4. No other significant listhesis or malalignment. Vertebrae: Vertebral body heights maintained without evidence for acute or chronic fracture. Scattered chronic endplate Schmorl's nodes present within the mid and lower thoracic spine. Chronic reactive endplate changes present about the T11-12 interspace anteriorly. Underlying bone marrow signal intensity within normal limits. No discrete or worrisome osseous lesions. Cord: Signal intensity within the thoracic spinal cord is normal. Conus medullaris terminates inferior to the T12 level. Paraspinal and other soft tissues: Paraspinous soft tissues demonstrate no acute abnormality. Visualized visceral structures grossly unremarkable. Small layering bilateral pleural effusions. Partially visualized lungs are otherwise grossly clear. Disc levels: T3-4:  Minimal disc bulge.  No stenosis. T5-6: Small central disc protrusion indents the ventral thecal sac. No significant stenosis. T5-6: Mild diffuse disc bulge.  No significant stenosis. T7-8: Left paracentral disc protrusion indents the ventral thecal sac with mild flattening the left hemi cord. No significant stenosis. T8-9: Mild diffuse disc bulge.  No significant stenosis. T9-10: Mild diffuse disc bulge. Mild facet hypertrophy. Borderline mild spinal stenosis. Mild right neural foraminal narrowing. MRI LUMBAR SPINE FINDINGS Segmentation: Normal segmentation. Lowest well-formed disc labeled the L5-S1 level. Alignment: Trace 3 mm retrolisthesis of L5 on S1. Vertebral bodies otherwise normally aligned. Vertebrae: Vertebral body heights are maintained without evidence for acute or chronic fracture. Prominent endplate Schmorl's nodes noted about the L2-3 interspace with associated mild reactive marrow edema. Chronic endplate reactive changes present at L5-S1. Underlying bone marrow signal intensity within  normal limits. No discrete or worrisome osseous lesions. Conus medullaris and cauda equina: Conus extends to the L1 level. Conus and cauda equina appear normal. Paraspinal and other soft tissues: Paraspinous soft tissues demonstrate no acute  abnormality. Subcentimeter T2 hyperintense cyst noted within the right kidney. Kidneys are somewhat atrophic bilaterally. Aneurysmal dilatation of the infrarenal aorta up to 3 cm noted. Disc levels: L1-2: Minimal disc bulge. Mild bilateral facet hypertrophy. No stenosis. L2-3: Mild diffuse disc bulge with disc desiccation. Mild to moderate facet and ligament flavum hypertrophy. Trace bilateral joint effusions. Mild canal with moderate bilateral subarticular stenosis. Mild bilateral L2 foraminal narrowing. L3-4: Mild diffuse disc bulge with disc desiccation. Moderate facet and ligament flavum hypertrophy. Resultant moderate spinal stenosis. Mild bilateral L3 foraminal narrowing. L4-5: Mild diffuse disc bulge. Moderate facet and ligament flavum hypertrophy. Resultant mild bilateral lateral recess narrowing without significant canal stenosis. Mild bilateral L4 foraminal narrowing. L5-S1: Chronic intervertebral disc space narrowing with diffuse disc bulge and reactive endplate changes. Left greater than right facet hypertrophy. No significant canal stenosis. Mild left L5 foraminal narrowing. IMPRESSION: MR THORACIC SPINE IMPRESSION 1. No acute traumatic injury within the thoracic spine. 2. Mild multilevel degenerative disc bulging at T3-4 through T9-10 as above without significant stenosis. 3. Small layering bilateral pleural effusions. MR LUMBAR SPINE IMPRESSION 1. No acute traumatic injury within the lumbar spine. 2. Multilevel degenerative spondylolysis with resultant mild to moderate canal and lateral recess stenosis at L2-3 and L3-4. Mild bilateral foraminal narrowing at L2 through L4. 3. **An incidental finding of potential clinical significance has been found. Aneurysmal  dilatation of the infrarenal aorta up to 3 cm. Recommend followup by ultrasound in 3 years. This recommendation follows ACR consensus guidelines: White Paper of the ACR Incidental Findings Committee II on Vascular Findings. Joellyn Rued Radiol 2013; [id number]** Electronically Signed   By: Jeannine Boga M.D.   On: 08/11/2017 23:47   Ct Maxillofacial Wo Contrast  Result Date: 08/11/2017 CLINICAL DATA:  The patient suffered a blow to the right eye with onset of pain, swelling and bruising due to a trip and fall over a walker today. Initial encounter. EXAM: CT HEAD WITHOUT CONTRAST CT MAXILLOFACIAL WITHOUT CONTRAST TECHNIQUE: Multidetector CT imaging of the head and maxillofacial structures were performed using the standard protocol without intravenous contrast. Multiplanar CT image reconstructions of the maxillofacial structures were also generated. COMPARISON:  Head CT scan 09/16/2011. FINDINGS: CT HEAD FINDINGS Brain: No evidence of acute infarction, hemorrhage, hydrocephalus, extra-axial collection or mass lesion/mass effect. Cortical atrophy and chronic microvascular ischemic change noted. Vascular: Atherosclerosis is identified. Skull: Intact. Other: None. CT MAXILLOFACIAL FINDINGS Osseous: The patient has an acute, depressed fracture of the medial wall of the right orbit. Remote fracture of the medial wall of the left orbit is noted and was present on the prior CT head. No other fracture is identified. Mandibular condyles are located. Orbits: There is mild proptosis on the right. The globes are intact bilaterally. The patient is status post bilateral lens extraction. Sinuses: Short air-fluid level is seen in the right maxillary sinus. There is opacification of the right ethmoid air cells. The right sphenoid sinus is nearly completely opacified with some calcifications present and wall thickening. Soft tissues: Hematoma about the right eye is identified. IMPRESSION: Acute fracture of the medial wall of  the right orbit. Associated mild right proptosis and soft tissue contusion about the right eye noted. No other acute finding. Atrophy and chronic microvascular ischemic change. Atherosclerosis. Remote fracture medial wall left orbit. Chronic right sphenoid sinus disease. Electronically Signed   By: Inge Rise M.D.   On: 08/11/2017 18:03    Procedures Procedures (including critical care time)  Medications Ordered in ED Medications  tetracaine (  PONTOCAINE) 0.5 % ophthalmic solution 2 drop (2 drops Right Eye Given by Other 08/11/17 1812)  fluorescein ophthalmic strip 1 strip (1 strip Right Eye Given by Other 08/11/17 1812)  acetaminophen (TYLENOL) tablet 1,000 mg (1,000 mg Oral Given 08/11/17 1812)  oxyCODONE (Oxy IR/ROXICODONE) immediate release tablet 5 mg (5 mg Oral Given 08/11/17 1812)  oxyCODONE (Oxy IR/ROXICODONE) immediate release tablet 5 mg (5 mg Oral Given 08/11/17 2121)     Initial Impression / Assessment and Plan / ED Course  I have reviewed the triage vital signs and the nursing notes.  Pertinent labs & imaging results that were available during my care of the patient were reviewed by me and considered in my medical decision making (see chart for details).     77 yo M with a chief complaint of a fall from standing.  The patient is unsure what made him fall.  On exam the patient has significant swelling and tenderness about the right orbit.  He is unable to look medially with the right eye.  He has some significant chemosis and proptosis.  The family sister arrived and provided further history.  Per her he has had a progressive loss of sensation to bilateral lower extremities going on for the past 6 months or so.  He is now starting to have loss of bowel and bladder as well.  He denies loss of perirectal sensation when asked.  He has 5 out of 5 muscle strength bilaterally.  There is no clonus the left lower extremity there is no hyperreflexia.  With the patient's concerning  symptoms for cauda equina I will obtain an MRI.  MRI negative for cauda equina.  D/c home.  Neuro follow up.   11:55 PM:  I have discussed the diagnosis/risks/treatment options with the patient and family and believe the pt to be eligible for discharge home to follow-up with PCP, neuro. We also discussed returning to the ED immediately if new or worsening sx occur. We discussed the sx which are most concerning (e.g., sudden worsening pain, fever, inability to tolerate by mouth) that necessitate immediate return. Medications administered to the patient during their visit and any new prescriptions provided to the patient are listed below.  Medications given during this visit Medications  tetracaine (PONTOCAINE) 0.5 % ophthalmic solution 2 drop (2 drops Right Eye Given by Other 08/11/17 1812)  fluorescein ophthalmic strip 1 strip (1 strip Right Eye Given by Other 08/11/17 1812)  acetaminophen (TYLENOL) tablet 1,000 mg (1,000 mg Oral Given 08/11/17 1812)  oxyCODONE (Oxy IR/ROXICODONE) immediate release tablet 5 mg (5 mg Oral Given 08/11/17 1812)  oxyCODONE (Oxy IR/ROXICODONE) immediate release tablet 5 mg (5 mg Oral Given 08/11/17 2121)     The patient appears reasonably screen and/or stabilized for discharge and I doubt any other medical condition or other [facility] requiring further screening, evaluation, or treatment in the ED at this time prior to discharge.    Final Clinical Impressions(s) / ED Diagnoses   Final diagnoses:  Ocular trauma  Closed displaced fracture of base of fifth metacarpal bone of right hand, initial encounter    ED Discharge Orders        Ordered    Ambulatory referral to Neurology    Comments:  Frequent falls, leg weakness   08/11/17 [place], [place], DO 08/11/17 2355

## 2017-08-11 NOTE — ED Notes (Signed)
Pt ambulatory to restroom using walker with assist from this RN

## 2017-08-11 NOTE — Discharge Instructions (Signed)
Follow up with your eye doctor, and your hand surgeon.  Return for worsening symptoms.  Try to follow up with neurology.  The number for the Ear Nose and throat physician is here, call and see if they would like you to see them in the office.

## 2017-08-11 NOTE — ED Notes (Signed)
Tonopen at bedside.  

## 2017-08-11 NOTE — ED Notes (Signed)
Pt given food to eat per EDP

## 2017-08-11 NOTE — ED Notes (Signed)
ED Provider at bedside. 

## 2017-08-11 NOTE — ED Notes (Signed)
Tonopen and meds at bedside. Awaiting pt return from radiology

## 2017-08-11 NOTE — ED Notes (Signed)
Ortho at bedside.

## 2017-08-11 NOTE — ED Notes (Signed)
MRI aware pt ready for scan

## 2017-08-11 NOTE — ED Notes (Signed)
Pt now c/o L ankle pain. EDP notified

## 2017-08-11 NOTE — ED Notes (Signed)
Patient transported to CT 

## 2017-08-11 NOTE — ED Notes (Signed)
Kim, EMT at the bedside for visual acuity

## 2017-08-11 NOTE — Progress Notes (Signed)
Orthopedic Tech Progress Note Patient Details:  [name] 06/29/1940 [id number]  Ortho Devices Type of Ortho Device: Ace wrap, Ulna gutter splint Ortho Device/Splint Location: RUE Ortho Device/Splint Interventions: Ordered, Application   Post Interventions Patient Tolerated: Well Instructions Provided: Care of device   Braulio Bosch 08/11/2017, 7:21 PM

## 2017-08-11 NOTE — ED Notes (Signed)
Ortho tech paged  

## 2017-08-11 NOTE — ED Triage Notes (Signed)
Pt here via [facility] after fall.  Was walking out of [employer] using his walker and he tripped, hitting his face on his walker.  No loc.  C.o pain to R wrist and R side of face.  Swelling to R eye.  Also c/o L ankle pain.  HR 50, bp 160/54, rr 18. ao x 4.  20 G to L AC.

## 2017-08-11 NOTE — ED Notes (Signed)
Pt still in MRI at this time

## 2017-08-11 NOTE — ED Notes (Signed)
Per XR, pt able to be transported to XR after CT scan

## 2017-08-11 NOTE — ED Notes (Signed)
Patient transported to MRI 

## 2017-08-12 ENCOUNTER — Ambulatory Visit: Payer: Medicare HMO | Admitting: Neurology

## 2017-08-12 ENCOUNTER — Encounter: Payer: Self-pay | Admitting: Neurology

## 2017-08-12 DIAGNOSIS — H532 Diplopia: Secondary | ICD-10-CM | POA: Diagnosis not present

## 2017-08-12 DIAGNOSIS — Z961 Presence of intraocular lens: Secondary | ICD-10-CM | POA: Diagnosis not present

## 2017-08-12 DIAGNOSIS — S0011XA Contusion of right eyelid and periocular area, initial encounter: Secondary | ICD-10-CM | POA: Diagnosis not present

## 2017-08-12 DIAGNOSIS — H1131 Conjunctival hemorrhage, right eye: Secondary | ICD-10-CM | POA: Diagnosis not present

## 2017-08-12 NOTE — ED Notes (Signed)
Pt verbalizes understanding of d/c instructions. Pt received prescriptions. Pt taken to lobby in wheelchair at d/c with all belongings.   

## 2017-08-13 ENCOUNTER — Encounter: Payer: Self-pay | Admitting: Neurology

## 2017-08-16 ENCOUNTER — Ambulatory Visit (INDEPENDENT_AMBULATORY_CARE_PROVIDER_SITE_OTHER): Payer: Medicare HMO | Admitting: Neurology

## 2017-08-16 ENCOUNTER — Ambulatory Visit: Payer: Medicare HMO | Admitting: Neurology

## 2017-08-16 ENCOUNTER — Other Ambulatory Visit (INDEPENDENT_AMBULATORY_CARE_PROVIDER_SITE_OTHER): Payer: Medicare HMO

## 2017-08-16 ENCOUNTER — Encounter: Payer: Self-pay | Admitting: Neurology

## 2017-08-16 VITALS — BP 130/80 | Resp 16 | Ht 71.0 in | Wt 277.2 lb

## 2017-08-16 DIAGNOSIS — G63 Polyneuropathy in diseases classified elsewhere: Secondary | ICD-10-CM

## 2017-08-16 DIAGNOSIS — I739 Peripheral vascular disease, unspecified: Secondary | ICD-10-CM | POA: Diagnosis not present

## 2017-08-16 DIAGNOSIS — M21372 Foot drop, left foot: Secondary | ICD-10-CM

## 2017-08-16 DIAGNOSIS — M5416 Radiculopathy, lumbar region: Secondary | ICD-10-CM | POA: Diagnosis not present

## 2017-08-16 DIAGNOSIS — R899 Unspecified abnormal finding in specimens from other organs, systems and tissues: Secondary | ICD-10-CM

## 2017-08-16 DIAGNOSIS — S62316D Displaced fracture of base of fifth metacarpal bone, right hand, subsequent encounter for fracture with routine healing: Secondary | ICD-10-CM | POA: Diagnosis not present

## 2017-08-16 LAB — VITAMIN B12: Vitamin B-12: 890 pg/mL (ref 211–911)

## 2017-08-16 LAB — FOLATE: Folate: 13.7 ng/mL (ref >5.9)

## 2017-08-16 LAB — TSH: TSH: 6.62 u[IU]/mL — AB (ref 0.35–4.50)

## 2017-08-16 MED ORDER — AMBULATORY NON FORMULARY MEDICATION: 1.0000 [IU] | Freq: Every day | 0 refills | Status: DC

## 2017-08-16 NOTE — Patient Instructions (Addendum)
NCS/EMG of the legs  Check labs   Referral to orthotics for left ankle-foot-orthotics

## 2017-08-16 NOTE — Progress Notes (Signed)
[facility] Clinic Note - Initial Visit   Date: 08/16/17  [name] MRN: [id number] DOB: 1940-06-25   Dear Dr. Tyrone Nine:  Thank you for your kind referral of [name] for consultation of falls. Although his history is well known to you, please allow [place] to reiterate it for the purpose of our medical record. The patient was accompanied to the clinic by wife and son who also provides collateral information.     History of Present Illness: [name] is a 77 y.o. right-handed Caucasian male with CAD, PAD, bilateral carotid disease s/p CEA, hypertension, hyperlipidemia, hypothyroidism, former smoker, and diabetes mellitus presenting for evaluation of falls and gait instability.    Starting around 2015, he began having problems with imbalance which has steadily worsened over the past few years.   Also over this time, he has become aware that his legs falls asleep frequently and has lack of sensation.  He finds himself often looking at his feet when walking, so that he is aware of their position.  He has suffered 16 falls over the past 3 years; the frequency has increased over the past 3-4 months.  He has fractured his right arm twice and with his fall last week, fractured the right orbit.  He always requires assistance to stand up from a fall, and often times, someone has to manually place his legs under him, in order to stand up.  His wife has noticed that when he walks, his left foot drags and slaps the floor.  He did physical therapy for gait training summer of 2018.  He has started using a shower chair because of imbalance and takes to make positional changes slowly to minimize sensation of lightheadedness.   He has long history of peripheral arterial disease and diabetes mellitus.  His diabetes is well-controlled.  He drinks 1-2 drinks per day (liquor).    Out-side paper records, electronic medical record, and images have been reviewed where  available and summarized as:  Lab Results  Component Value Date   TSH 4.149 09/23/2011   Lab Results  Component Value Date   HGBA1C 6.4 (H) 09/24/2011   CT head 08/11/2017:  Acute fracture of the medial wall of the right orbit. Associated mild right proptosis and soft tissue contusion about the right eye noted. No other acute finding. Atrophy and chronic microvascular ischemic change. Atherosclerosis. Remote fracture medial wall left orbit. Chronic right sphenoid sinus disease.  MRI thoracic spine 08/11/2017: 1. No acute traumatic injury within the thoracic spine. 2. Mild multilevel degenerative disc bulging at T3-4 through T9-10 as above without significant stenosis. 3. Small layering bilateral pleural effusions.  MR LUMBAR SPINE 08/11/2017: 1. No acute traumatic injury within the lumbar spine. 2. Multilevel degenerative spondylolysis with resultant mild to moderate canal and lateral recess stenosis at L2-3 and L3-4. Mild bilateral foraminal narrowing at L2 through L4. 3. **An incidental finding of potential clinical significance has been found. Aneurysmal dilatation of the infrarenal aorta up to 3 cm.  Past Medical History:  Diagnosis Date  . Arthritis   . Atherosclerotic heart disease   . Back pain   . Bruises easily    takes Pletal daily and ASA 325mg  daily  . Cataract immature    right  . Chest pain   . Colon polyps   . Cyst    on perineum;takes Doxycycline daily  . Diabetes mellitus    takes Glipizide,Metformin,and Actos daily  . Diarrhea   . Edema  right leg knee down swollen since fall 3 wks ago  . GERD (gastroesophageal reflux disease)    Rolaids as needed-occ reflux  . Hemorrhoids   . History of kidney stones    at age 77   . Hyperlipidemia    takes Tricor and Lipitor daily  . Hyperlipidemia   . Hypertension    takes Altace,Amlodipine,and Nadolol daily  . Kidney stone    35+yrs ago  . Muscle pain   . Nasal polyps    hx of  . Peripheral edema     wears knee length hose-told by podiatrist to wear these  . Peripheral neuropathy   . Peripheral neuropathy    takes Gabapentin daily  . Pneumonia    as a child  . PVD (peripheral vascular disease) ([place])   . Renal insufficiency   . Seasonal allergies    takes Mucinex and Zyrtec prn  . Urinary frequency    urgency/takes Vesicare daily    Past Surgical History:  Procedure Laterality Date  . Aortobifemoral bypass    . CARDIAC CATHETERIZATION  most recent 05/2011   total of 4  . CAROTID ANGIOGRAM N/A 06/05/2011   Procedure: CAROTID ANGIOGRAM;  Surgeon: Elam Dutch, MD;  Location: [facility] CATH LAB;  Service: Cardiovascular;  Laterality: N/A;  . CAROTID ENDARTERECTOMY  04/08/2011   left CEA  . cataract surgery     left  . COLONOSCOPY    . COLONOSCOPY WITH PROPOFOL N/A 08/22/2013   Procedure: COLONOSCOPY WITH PROPOFOL;  Surgeon: Garlan Fair, MD;  Location: [facility] ENDOSCOPY;  Service: Endoscopy;  Laterality: N/A;  . CORONARY ARTERY BYPASS GRAFT  09/09/2011   Procedure: CORONARY ARTERY BYPASS GRAFTING (CABG);  Surgeon: Gaye Pollack, MD;  Location: [facility];  Service: Open Heart Surgery;  Laterality: N/A;  Coronary artery bypass grafting  x three with Right saphenous vein harvested endoscopically and left internal mammary artery  . ENDARTERECTOMY  04/08/2011   Procedure: ENDARTERECTOMY CAROTID;  Surgeon: Elam Dutch, MD;  [facility];  Service: Vascular;  Laterality: Left;  with patch angioplasty  . ENDARTERECTOMY  09/09/2011   Procedure: ENDARTERECTOMY CAROTID;  Surgeon: Elam Dutch, MD;  [facility];  Service: Vascular;  Laterality: Right;  with patch angioplasty  . KNEE ARTHROSCOPY  2011  . LEFT HEART CATHETERIZATION WITH CORONARY ANGIOGRAM N/A 07/09/2011   Procedure: LEFT HEART CATHETERIZATION WITH CORONARY ANGIOGRAM;  Surgeon: Sinclair Grooms, MD;  Location: [facility] C[facility] LAB;  Service: Cardiovascular;  Laterality: N/A;  . Reimplantation of inferior mesenteric artery    .  Repair of infrarenal abdominal aortic aneurysm    . SHOULDER ARTHROSCOPY W/ ROTATOR CUFF REPAIR     right and left-open procedures  . TONSILLECTOMY     at age 64  . TRIGGER FINGER RELEASE Right 06/28/2014   Procedure: RIGHT LONG TRIGGER RELEASE ;  Surgeon: Leanora Cover, MD;  Location: [facility];  Service: Orthopedics;  Laterality: Right;  . wisdom teeth extracted     as a teenager     Medications:  Outpatient Encounter Medications as of 08/16/2017  Medication Sig Note  . atorvastatin (LIPITOR) 20 MG tablet Take 20 mg by mouth every evening.    Marland Kitchen b complex vitamins capsule Take 1 capsule by mouth daily.     . Cetirizine HCl (ZYRTEC ALLERGY PO) Take 10 tablets by mouth daily as needed (allergies). For allergies   . Cyanocobalamin (VITAMIN B 12 PO) Take 1 tablet by mouth every other  day.   . DM-Phenylephrine-Acetaminophen (VICKS DAYQUIL COLD & FLU) 10-5-325 MG/15ML LIQD Take by mouth.   . erythromycin ophthalmic ointment USE A SMALL AMOUNT IN THE LEFT EYE TID AS DIRECTED 02/25/2015: Received from: External Pharmacy  . fenofibrate (TRICOR) 145 MG tablet Take 145 mg by mouth every morning.   . furosemide (LASIX) 40 MG tablet Take 40 mg by mouth daily as needed for fluid or edema.   . gabapentin (NEURONTIN) 300 MG capsule Take two (2) capsules (600 mg total) by mouth every morning and take one (1) capsule (300 mg total) by mouth every evening. Can take and additional dose one (1) capsule (300 mg total) by mouth as needed for nerve pain.   Marland Kitchen levothyroxine (SYNTHROID, LEVOTHROID) 50 MCG tablet Take 50 mcg by mouth daily before breakfast.   . Loperamide HCl (IMODIUM PO) Take 2 mg by mouth daily as needed. For diarrhea   . methylcellulose (ARTIFICIAL TEARS) 1 % ophthalmic solution Place 1 drop into both eyes 2 (two) times daily as needed (dry eyes).   . metoprolol (LOPRESSOR) 50 MG tablet Take 50 mg by mouth 2 (two) times daily.   Marland Kitchen morphine (MSIR) 15 MG tablet Take 1 tablet (15 mg total)  by mouth every 4 (four) hours as needed for severe pain.   . nitroGLYCERIN (NITROSTAT) 0.4 MG SL tablet PLACE 1 TABLET UNDER TONGUE EVERY 5 MINUTES AS NEEDED FOR CHEST PAIN.   Marland Kitchen pioglitazone (ACTOS) 15 MG tablet Take 30 mg by mouth daily. Takes 2 15mg  QD   . potassium chloride SA (K-DUR,KLOR-CON) 20 MEQ tablet Take 20 mEq by mouth daily as needed (Take with lasix).   . pseudoephedrine-guaifenesin (MUCINEX D) 60-600 MG per tablet Take 1 tablet by mouth every 12 (twelve) hours as needed for congestion. Congestion   . ramipril (ALTACE) 10 MG capsule Take 10 mg by mouth daily.   . sodium chloride (OCEAN) 0.65 % SOLN nasal spray Place 1 spray into both nostrils 2 (two) times daily as needed for congestion.   . temazepam (RESTORIL) 15 MG capsule Take 15 mg by mouth at bedtime as needed. For sleep   . AMBULATORY NON FORMULARY MEDICATION 1 Units by Other route daily. Left AFO   . aspirin 325 MG EC tablet Take 325 mg by mouth every evening.     No facility-administered encounter medications on file as of 08/16/2017.      Allergies:  Allergies  Allergen Reactions  . Amoxicillin-Pot Clavulanate Nausea Only    Augmentin     Family History: Family History  Problem Relation Age of Onset  . Heart disease Mother   . Heart disease Father   . Anesthesia problems Neg Hx   . Hypotension Neg Hx   . Malignant hyperthermia Neg Hx   . Pseudochol deficiency Neg Hx     Social History: Social History   Tobacco Use  . Smoking status: Former Smoker    Years: 40.00    Types: Cigarettes    Last attempt to quit: 10/27/2005    Years since quitting: 11.8  . Smokeless tobacco: Never Used  Substance Use Topics  . Alcohol use: Yes    Alcohol/week: 0.0 oz    Types: 2 - 3 Cans of beer per week    Comment: occasional- weekly  . Drug use: No   Social History   Social History Narrative   He lives with wife in a 1 story home, three steps to entire home.  2 children.  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        Review of Systems:  CONSTITUTIONAL: No fevers, chills, night sweats, or weight loss.   EYES: No visual changes +eye pain ENT: No hearing changes.  No history of nose bleeds.   RESPIRATORY: No cough, wheezing and shortness of breath.   CARDIOVASCULAR: Negative for chest pain, and palpitations.   GI: Negative for abdominal discomfort, blood in stools or black stools.  No recent change in bowel habits.   GU:  No history of incontinence.   MUSCLOSKELETAL: +history of joint pain +swelling.  No myalgias.   SKIN: Negative for lesions, rash, and itching.   HEMATOLOGY/ONCOLOGY: Negative for prolonged bleeding, bruising easily, and swollen nodes.  No history of cancer.   ENDOCRINE: Negative for cold or heat intolerance, polydipsia or goiter.   PSYCH:  No depression or anxiety symptoms.   NEURO: As Above.   Vital Signs:  BP 130/80   Resp 16   Ht 5\' 11"  (1.803 m)   Wt 277 lb 4 oz (125.8 kg)   BMI 38.67 kg/m    General Medical Exam:   General:  Well appearing, comfortable.   Eyes/ENT:  Large ecchymosis over the right maxilla, moderate swelling of the right face, R chemosis and mild proptosis Neck: No masses appreciated.  Full range of motion without tenderness.  No carotid bruits. Respiratory:  Clear to auscultation, good air entry bilaterally.   Cardiac:  Regular rate and rhythm, no murmur.   Extremities:  Bilateral pitting edema of the legs.  Right hand is immobilized in a splint Skin:  No rashes or lesions.  Neurological Exam: MENTAL STATUS including orientation to time, place, person, recent and remote memory, attention span and concentration, language, and fund of knowledge is normal.  Speech is not dysarthric.  CRANIAL NERVES: II:  No visual field defects.  Unremarkable fundi.   III-IV-VI: Pupils equal round and reactive to light.  Normal conjugate, extra-ocular eye movements in all directions of gaze.  No nystagmus.  Moderate right ptosis from swelling.   V:  Normal facial  sensation.     VII:  Normal facial symmetry and movements.  VIII:  Normal hearing and vestibular function.   IX-X:  Normal palatal movement.   XI:  Normal shoulder shrug and head rotation.   XII:  Normal tongue strength and range of motion, no deviation or fasciculation.  MOTOR:  No atrophy, fasciculations or abnormal movements.  No pronator drift.  Tone is normal.    Right Upper Extremity:    Left Upper Extremity:    Deltoid  5/5   Deltoid  5/5   Biceps  5/5   Biceps  5/5   Triceps  5/5   Triceps  5/5   Wrist extensors  5/5   Wrist extensors  5/5   Wrist flexors  5/5   Wrist flexors  5/5   Finger extensors  5/5   Finger extensors  5/5   Finger flexors  5/5   Finger flexors  5/5   Dorsal interossei  5-/5   Dorsal interossei  5-/5   Abductor pollicis  5/5   Abductor pollicis  5-/5   Tone (Ashworth scale)  0  Tone (Ashworth scale)  0   Right Lower Extremity:    Left Lower Extremity:    Hip flexors  4/5   Hip flexors  4/5   Hip extensors  5/5   Hip extensors  5/5   Knee flexors  5/5   Knee flexors  5/5  Knee extensors  5/5   Knee extensors  5/5   Dorsiflexors  5-/5   Dorsiflexors  4/5   Eversion 5/5  Eversion 4/5  Inversion 5/5  Inversion 4/5  Plantarflexors  5-/5   Plantarflexors  4/5   Toe extensors  4+/5   Toe extensors  3/5   Toe flexors  4+/5   Toe flexors  3/5   Tone (Ashworth scale)  0  Tone (Ashworth scale)  0   MSRs:  Right                                                                 Left brachioradialis 2+  brachioradialis 2+  biceps 2+  biceps 2+  triceps 2+  triceps 2+  patellar 0  Patellar 0  ankle jerk 0  ankle jerk 0  Hoffman no  Hoffman no  plantar response down  plantar response down   SENSORY: Pin prick and temperature is reduced distal to mid-calf bilaterally, vibration is trace at the knees and absent distal to ankles. Proprioception is impaired at the great toe bilaterally, intact at the ankle.  Romberg's sign positive.   COORDINATION/GAIT: Normal  finger-to- nose-finger.  Intact rapid alternating movements bilaterally.  Unable to rise from a chair without using arms. Gait is wide-based, dragging of the left foot, and assisted with rigid walker   IMPRESSION: Peripheral neuropathy due to peripheral arterial disease, contributed by alcohol and to a lesser degree diabetes, which is very well-controlled.  He has severe sensory ataxia, left > right foot weakness, and stocking-glove pattern of sensory loss. Additionally, he also has hip flexion weakness.  I have viewed patient's imaging which shows spinal canal stenosis at L2-3 and L3-4 which explains his proximal leg weakness. There is no encroachment of the left L5 nerve root, which makes his asymmetrical weakness difficult to attribute to his back.  Neuropathy tends to be symmetric, however, he does have more severe PAD in the left leg, which could explain this difference.  NCS/EMG of the leg will be ordered to better understand the severity of his neuropathy (?superimposed left peroneal neuropathy) and assess for overlapping radiculopathy.   I had extensive discussion with the patient regarding the pathogenesis, etiology, management, and natural course of neuropathy. Neuropathy tends to be slowly progressive.  I would like to test for treatable causes of neuropathy.   PLAN/RECOMMENDATIONS:  NCS/EMG of the legs Check vitamin B1, vitamin B12, copper, MMA, folate, SPEP with IFE, TSH, HbA1c  Fall precautions, including using a shower chair, using nightlights, etc discussed Do not recommend that he continue to drive given the severity of his proprioceptive loss in the feet Referral to orthotics for left AFO  Further recommendations pending results  The duration of this appointment visit was 65 minutes of face-to-face time with the patient.  Greater than 50% of this time was spent in counseling, explanation of diagnosis, planning of further management, and coordination of care.   Thank you for  allowing me to participate in patient's care.  If I can answer any additional questions, I would be pleased to do so.    Sincerely,    Tisheena Maguire K. Posey Pronto, DO

## 2017-08-17 ENCOUNTER — Telehealth: Payer: Self-pay | Admitting: *Deleted

## 2017-08-17 ENCOUNTER — Other Ambulatory Visit: Payer: Self-pay | Admitting: *Deleted

## 2017-08-17 DIAGNOSIS — E1151 Type 2 diabetes mellitus with diabetic peripheral angiopathy without gangrene: Secondary | ICD-10-CM | POA: Diagnosis not present

## 2017-08-17 DIAGNOSIS — I10 Essential (primary) hypertension: Secondary | ICD-10-CM | POA: Diagnosis not present

## 2017-08-17 DIAGNOSIS — R899 Unspecified abnormal finding in specimens from other organs, systems and tissues: Secondary | ICD-10-CM

## 2017-08-17 DIAGNOSIS — S0280XA Fracture of other specified skull and facial bones, unspecified side, initial encounter for closed fracture: Secondary | ICD-10-CM | POA: Diagnosis not present

## 2017-08-17 DIAGNOSIS — N183 Chronic kidney disease, stage 3 (moderate): Secondary | ICD-10-CM | POA: Diagnosis not present

## 2017-08-17 DIAGNOSIS — R152 Fecal urgency: Secondary | ICD-10-CM | POA: Diagnosis not present

## 2017-08-17 DIAGNOSIS — E1122 Type 2 diabetes mellitus with diabetic chronic kidney disease: Secondary | ICD-10-CM | POA: Diagnosis not present

## 2017-08-17 DIAGNOSIS — R609 Edema, unspecified: Secondary | ICD-10-CM | POA: Diagnosis not present

## 2017-08-17 DIAGNOSIS — R269 Unspecified abnormalities of gait and mobility: Secondary | ICD-10-CM | POA: Diagnosis not present

## 2017-08-17 DIAGNOSIS — E1142 Type 2 diabetes mellitus with diabetic polyneuropathy: Secondary | ICD-10-CM | POA: Diagnosis not present

## 2017-08-17 LAB — T4, FREE: FREE T4: 1.57 ng/dL (ref 0.60–1.60)

## 2017-08-17 LAB — T3, FREE: T3, Free: 2 pg/mL — ABNORMAL LOW (ref 2.3–4.2)

## 2017-08-17 NOTE — Telephone Encounter (Signed)
Labs added.

## 2017-08-17 NOTE — Telephone Encounter (Signed)
-----   Message from Alda Berthold, DO sent at 08/17/2017  8:05 AM EDT ----- Please ask lab to add free T3 and free T4, thanks.

## 2017-08-19 ENCOUNTER — Telehealth: Payer: Self-pay | Admitting: *Deleted

## 2017-08-19 DIAGNOSIS — H1131 Conjunctival hemorrhage, right eye: Secondary | ICD-10-CM | POA: Diagnosis not present

## 2017-08-19 DIAGNOSIS — Z961 Presence of intraocular lens: Secondary | ICD-10-CM | POA: Diagnosis not present

## 2017-08-19 DIAGNOSIS — S0011XD Contusion of right eyelid and periocular area, subsequent encounter: Secondary | ICD-10-CM | POA: Diagnosis not present

## 2017-08-19 DIAGNOSIS — H532 Diplopia: Secondary | ICD-10-CM | POA: Diagnosis not present

## 2017-08-19 LAB — PROTEIN ELECTROPHORESIS, SERUM
ALPHA 1: 0.4 g/dL — AB (ref 0.2–0.3)
ALPHA 2: 0.8 g/dL (ref 0.5–0.9)
Albumin ELP: 3.2 g/dL — ABNORMAL LOW (ref 3.8–4.8)
Beta 2: 0.4 g/dL (ref 0.2–0.5)
Beta Globulin: 0.5 g/dL (ref 0.4–0.6)
Gamma Globulin: 0.8 g/dL (ref 0.8–1.7)
TOTAL PROTEIN: 6.2 g/dL (ref 6.1–8.1)

## 2017-08-19 LAB — COPPER, SERUM

## 2017-08-19 LAB — IMMUNOFIXATION ELECTROPHORESIS
IGG (IMMUNOGLOBIN G), SERUM: 815 mg/dL (ref 694–1618)
IMMUNOGLOBULIN A: 320 mg/dL (ref 81–463)
IgM, Serum: 76 mg/dL (ref 48–271)
Immunofix Electr Int: NOT DETECTED

## 2017-08-19 LAB — METHYLMALONIC ACID, SERUM: Methylmalonic Acid, Quant: 283 nmol/L (ref 87–318)

## 2017-08-19 LAB — VITAMIN B1: Vitamin B1 (Thiamine): 10 nmol/L (ref 8–30)

## 2017-08-19 NOTE — Telephone Encounter (Signed)
Entered in error

## 2017-08-19 NOTE — Telephone Encounter (Signed)
-----   Message from Alda Berthold, DO sent at 08/19/2017 10:58 AM EDT ----- Please inform pt that his thyroid levels are low, fax results to Dr. Laurann Montana to review and see if any adjustments need to be made in his medications.  Remains labs are normal - doubt this is causing his neuropathy.  EMG is pending.

## 2017-08-19 NOTE — Telephone Encounter (Signed)
Patient given results and results faxed to Dr. Laurann Montana.

## 2017-08-20 DIAGNOSIS — E1122 Type 2 diabetes mellitus with diabetic chronic kidney disease: Secondary | ICD-10-CM | POA: Diagnosis not present

## 2017-08-20 DIAGNOSIS — S62306D Unspecified fracture of fifth metacarpal bone, right hand, subsequent encounter for fracture with routine healing: Secondary | ICD-10-CM | POA: Diagnosis not present

## 2017-08-20 DIAGNOSIS — I129 Hypertensive chronic kidney disease with stage 1 through stage 4 chronic kidney disease, or unspecified chronic kidney disease: Secondary | ICD-10-CM | POA: Diagnosis not present

## 2017-08-20 DIAGNOSIS — I2510 Atherosclerotic heart disease of native coronary artery without angina pectoris: Secondary | ICD-10-CM | POA: Diagnosis not present

## 2017-08-20 DIAGNOSIS — M21372 Foot drop, left foot: Secondary | ICD-10-CM | POA: Diagnosis not present

## 2017-08-20 DIAGNOSIS — S0281XD Fracture of other specified skull and facial bones, right side, subsequent encounter for fracture with routine healing: Secondary | ICD-10-CM | POA: Diagnosis not present

## 2017-08-23 DIAGNOSIS — S0281XD Fracture of other specified skull and facial bones, right side, subsequent encounter for fracture with routine healing: Secondary | ICD-10-CM | POA: Diagnosis not present

## 2017-08-23 DIAGNOSIS — S62306D Unspecified fracture of fifth metacarpal bone, right hand, subsequent encounter for fracture with routine healing: Secondary | ICD-10-CM | POA: Diagnosis not present

## 2017-08-23 DIAGNOSIS — E1122 Type 2 diabetes mellitus with diabetic chronic kidney disease: Secondary | ICD-10-CM | POA: Diagnosis not present

## 2017-08-23 DIAGNOSIS — M21372 Foot drop, left foot: Secondary | ICD-10-CM | POA: Diagnosis not present

## 2017-08-23 DIAGNOSIS — I2510 Atherosclerotic heart disease of native coronary artery without angina pectoris: Secondary | ICD-10-CM | POA: Diagnosis not present

## 2017-08-23 DIAGNOSIS — I129 Hypertensive chronic kidney disease with stage 1 through stage 4 chronic kidney disease, or unspecified chronic kidney disease: Secondary | ICD-10-CM | POA: Diagnosis not present

## 2017-08-26 DIAGNOSIS — I129 Hypertensive chronic kidney disease with stage 1 through stage 4 chronic kidney disease, or unspecified chronic kidney disease: Secondary | ICD-10-CM | POA: Diagnosis not present

## 2017-08-26 DIAGNOSIS — S62306D Unspecified fracture of fifth metacarpal bone, right hand, subsequent encounter for fracture with routine healing: Secondary | ICD-10-CM | POA: Diagnosis not present

## 2017-08-26 DIAGNOSIS — R609 Edema, unspecified: Secondary | ICD-10-CM | POA: Diagnosis not present

## 2017-08-26 DIAGNOSIS — E1122 Type 2 diabetes mellitus with diabetic chronic kidney disease: Secondary | ICD-10-CM | POA: Diagnosis not present

## 2017-08-26 DIAGNOSIS — M21372 Foot drop, left foot: Secondary | ICD-10-CM | POA: Diagnosis not present

## 2017-08-26 DIAGNOSIS — S0281XD Fracture of other specified skull and facial bones, right side, subsequent encounter for fracture with routine healing: Secondary | ICD-10-CM | POA: Diagnosis not present

## 2017-08-26 DIAGNOSIS — T148XXA Other injury of unspecified body region, initial encounter: Secondary | ICD-10-CM | POA: Diagnosis not present

## 2017-08-26 DIAGNOSIS — I2510 Atherosclerotic heart disease of native coronary artery without angina pectoris: Secondary | ICD-10-CM | POA: Diagnosis not present

## 2017-08-27 DIAGNOSIS — S0281XD Fracture of other specified skull and facial bones, right side, subsequent encounter for fracture with routine healing: Secondary | ICD-10-CM | POA: Diagnosis not present

## 2017-08-27 DIAGNOSIS — M21372 Foot drop, left foot: Secondary | ICD-10-CM | POA: Diagnosis not present

## 2017-08-27 DIAGNOSIS — E1122 Type 2 diabetes mellitus with diabetic chronic kidney disease: Secondary | ICD-10-CM | POA: Diagnosis not present

## 2017-08-27 DIAGNOSIS — I2510 Atherosclerotic heart disease of native coronary artery without angina pectoris: Secondary | ICD-10-CM | POA: Diagnosis not present

## 2017-08-27 DIAGNOSIS — I129 Hypertensive chronic kidney disease with stage 1 through stage 4 chronic kidney disease, or unspecified chronic kidney disease: Secondary | ICD-10-CM | POA: Diagnosis not present

## 2017-08-27 DIAGNOSIS — S62306D Unspecified fracture of fifth metacarpal bone, right hand, subsequent encounter for fracture with routine healing: Secondary | ICD-10-CM | POA: Diagnosis not present

## 2017-08-31 ENCOUNTER — Ambulatory Visit (INDEPENDENT_AMBULATORY_CARE_PROVIDER_SITE_OTHER): Payer: Medicare HMO | Admitting: Neurology

## 2017-08-31 DIAGNOSIS — G63 Polyneuropathy in diseases classified elsewhere: Secondary | ICD-10-CM | POA: Diagnosis not present

## 2017-08-31 DIAGNOSIS — M21372 Foot drop, left foot: Secondary | ICD-10-CM

## 2017-08-31 DIAGNOSIS — I739 Peripheral vascular disease, unspecified: Secondary | ICD-10-CM

## 2017-08-31 NOTE — Procedures (Addendum)
[facility]  [place], [place]  [place], [place] [address] Tel: [phone] Fax:  [phone] Test Date:  08/31/2017  Patient: [name] DOB: [birth year] Physician: Narda Amber, DO  Sex: Male Height: 5\' 11"  Ref Phys: Narda Amber, DO  ID#: [id number] Temp: 32.3C Technician:    Patient Complaints: This is a 77 year old man referred for evaluation of progressive gait instability, falls, and left foot drop.  NCV & EMG Findings: The study was technically challenging due to body physiognomy. Findings are as follows: 1. Bilateral sural and superficial peroneal sensory responses are absent. 2. Bilateral peroneal (EDB) and tibial motor responses are absent. Bilateral peroneal motor responses at the tibialis anterior shows severely reduced amplitude. 3. Bilateral tibial H reflex studies are absent. 4. Severe chronic motor axon loss changes are seen affecting the muscles of the lower extremity conforming to gradient pattern and is significantly worse in theleft lower extremity. Despite maximal activation, there is no motor unit recruitment in the tibialis anterior and flexor digitorum longus muscles on the left. There is no evidence of accompanied active denervation.  Impression: The electrophysiologic findings are most consistent with a chronic and severe sensorimotor axonal polyneuropathy affecting the lower extremities, which is worse on the left.   A superimposed multilevel intraspinal canal lesion process affecting the L3-S1 myotomes cannot be excluded.   ___________________________ Narda Amber, DO    Nerve Conduction Studies Anti Sensory Summary Table   Site NR Peak (ms) Norm Peak (ms) P-T Amp (V) Norm P-T Amp  Left Sup Peroneal Anti Sensory (Ant Lat Mall)  12 cm NR  <4.6  >3  Right Sup Peroneal Anti Sensory (Ant Lat Mall)  12 cm NR  <4.6  >3  Left Sural Anti Sensory (Lat Mall)  Calf NR  <4.6  >3  Right Sural Anti Sensory (Lat Mall)  Calf NR  <4.6  >3    Motor Summary Table   Site NR Onset (ms) Norm Onset (ms) O-P Amp (mV) Norm O-P Amp Site1 Site2 Delta-0 (ms) Dist (cm) Vel (m/s) Norm Vel (m/s)  Left Peroneal Motor (Ext Dig Brev)  Ankle NR  <6.0  >2.5 B Fib Ankle  0.0  >40  B Fib NR     Poplt B Fib  0.0  >40  Poplt NR            Right Peroneal Motor (Ext Dig Brev)  Ankle NR  <6.0  >2.5 B Fib Ankle  0.0  >40  B Fib NR     Poplt B Fib  0.0  >40  Poplt NR            Left Peroneal TA Motor (Tib Ant)  Lower extremity edema  Fib Head    3.0 <4.5 0.4 >3 Poplit Fib Head 1.1 10.0 91 >40  Poplit    4.1  0.3         Right Peroneal TA Motor (Tib Ant)    Lower extremity edema  Fib Head    2.3 <4.5 0.7 >3 Poplit Fib Head 2.0 8.0 40 >40  Poplit    4.3  0.6         Left Tibial Motor ([place]d Hall Brev)  Ankle NR  <6.0  >4 Knee Ankle  0.0  >40  Knee NR            Right Tibial Motor (Abd Hall Brev)  Ankle NR  <6.0  >4 Knee Ankle  0.0  >40  Knee NR  H Reflex Studies   NR H-Lat (ms) Lat Norm (ms) L-R H-Lat (ms)  Left Tibial (Gastroc)  NR  <35   Right Tibial (Gastroc)  NR  <35    EMG   Side Muscle Ins Act Fibs Psw Fasc Number Recrt Dur Dur. Amp Amp. Poly Poly. Comment  Right AntTibialis Nml Nml Nml Nml [facility] Rapid All 1+ All 1+ All 1+ N/A  Right Gastroc Nml Nml Nml Nml 3- Rapid Most 1+ Most 1+ Nml Nml N/A  Right Flex Dig Long Nml Nml Nml Nml SMU Rapid All 1+ All 1+ Nml Nml N/A  Right RectFemoris Nml Nml Nml Nml 2- Rapid Many 1+ Many 1+ Nml Nml N/A  Right GluteusMed Nml Nml Nml Nml 1- Rapid Some 1+ Some 1+ Nml Nml N/A  Left AntTibialis Nml Nml Nml Nml NE None - - - - - - N/A  Left Gastroc Nml Nml Nml Nml 2- Rapid Many 1+ Many 1+ Nml Nml N/A  Left Flex Dig Long Nml Nml Nml Nml NE None - - - - - - N/A  Left RectFemoris Nml Nml Nml Nml 2- Rapid Some 1+ Some 1+ Nml Nml N/A  Left GluteusMed Nml Nml Nml Nml 1- Rapid Some 1+ Some 1+ Nml Nml N/A      Waveforms:

## 2017-09-01 DIAGNOSIS — R0789 Other chest pain: Secondary | ICD-10-CM | POA: Diagnosis not present

## 2017-09-01 DIAGNOSIS — R609 Edema, unspecified: Secondary | ICD-10-CM | POA: Diagnosis not present

## 2017-09-01 DIAGNOSIS — T148XXA Other injury of unspecified body region, initial encounter: Secondary | ICD-10-CM | POA: Diagnosis not present

## 2017-09-03 ENCOUNTER — Other Ambulatory Visit: Payer: Self-pay | Admitting: *Deleted

## 2017-09-03 ENCOUNTER — Telehealth: Payer: Self-pay | Admitting: *Deleted

## 2017-09-03 DIAGNOSIS — R159 Full incontinence of feces: Secondary | ICD-10-CM

## 2017-09-03 DIAGNOSIS — E1122 Type 2 diabetes mellitus with diabetic chronic kidney disease: Secondary | ICD-10-CM | POA: Diagnosis not present

## 2017-09-03 DIAGNOSIS — R413 Other amnesia: Secondary | ICD-10-CM

## 2017-09-03 DIAGNOSIS — S0281XD Fracture of other specified skull and facial bones, right side, subsequent encounter for fracture with routine healing: Secondary | ICD-10-CM | POA: Diagnosis not present

## 2017-09-03 DIAGNOSIS — S62306D Unspecified fracture of fifth metacarpal bone, right hand, subsequent encounter for fracture with routine healing: Secondary | ICD-10-CM | POA: Diagnosis not present

## 2017-09-03 DIAGNOSIS — R270 Ataxia, unspecified: Secondary | ICD-10-CM

## 2017-09-03 DIAGNOSIS — M21372 Foot drop, left foot: Secondary | ICD-10-CM | POA: Diagnosis not present

## 2017-09-03 DIAGNOSIS — I129 Hypertensive chronic kidney disease with stage 1 through stage 4 chronic kidney disease, or unspecified chronic kidney disease: Secondary | ICD-10-CM | POA: Diagnosis not present

## 2017-09-03 DIAGNOSIS — I2510 Atherosclerotic heart disease of native coronary artery without angina pectoris: Secondary | ICD-10-CM | POA: Diagnosis not present

## 2017-09-03 NOTE — Telephone Encounter (Signed)
-----   Message from Alda Berthold, DO sent at 09/02/2017  4:58 PM EDT ----- Please order MRI brain without contrast to evaluate for normal pressure hydrocephalus, given his incontinence and ataxia.  Use diagnosis: memory changes, gait ataxia.  Thanks.

## 2017-09-03 NOTE — Telephone Encounter (Signed)
Patient and daughter given the results.  MRI order placed.

## 2017-09-06 DIAGNOSIS — S0281XD Fracture of other specified skull and facial bones, right side, subsequent encounter for fracture with routine healing: Secondary | ICD-10-CM | POA: Diagnosis not present

## 2017-09-06 DIAGNOSIS — S62306D Unspecified fracture of fifth metacarpal bone, right hand, subsequent encounter for fracture with routine healing: Secondary | ICD-10-CM | POA: Diagnosis not present

## 2017-09-06 DIAGNOSIS — I129 Hypertensive chronic kidney disease with stage 1 through stage 4 chronic kidney disease, or unspecified chronic kidney disease: Secondary | ICD-10-CM | POA: Diagnosis not present

## 2017-09-06 DIAGNOSIS — M21372 Foot drop, left foot: Secondary | ICD-10-CM | POA: Diagnosis not present

## 2017-09-06 DIAGNOSIS — I2510 Atherosclerotic heart disease of native coronary artery without angina pectoris: Secondary | ICD-10-CM | POA: Diagnosis not present

## 2017-09-06 DIAGNOSIS — E1122 Type 2 diabetes mellitus with diabetic chronic kidney disease: Secondary | ICD-10-CM | POA: Diagnosis not present

## 2017-09-08 DIAGNOSIS — M21372 Foot drop, left foot: Secondary | ICD-10-CM | POA: Diagnosis not present

## 2017-09-08 DIAGNOSIS — S62306D Unspecified fracture of fifth metacarpal bone, right hand, subsequent encounter for fracture with routine healing: Secondary | ICD-10-CM | POA: Diagnosis not present

## 2017-09-08 DIAGNOSIS — S0281XD Fracture of other specified skull and facial bones, right side, subsequent encounter for fracture with routine healing: Secondary | ICD-10-CM | POA: Diagnosis not present

## 2017-09-08 DIAGNOSIS — E1122 Type 2 diabetes mellitus with diabetic chronic kidney disease: Secondary | ICD-10-CM | POA: Diagnosis not present

## 2017-09-08 DIAGNOSIS — I2510 Atherosclerotic heart disease of native coronary artery without angina pectoris: Secondary | ICD-10-CM | POA: Diagnosis not present

## 2017-09-08 DIAGNOSIS — I129 Hypertensive chronic kidney disease with stage 1 through stage 4 chronic kidney disease, or unspecified chronic kidney disease: Secondary | ICD-10-CM | POA: Diagnosis not present

## 2017-09-10 DIAGNOSIS — E1122 Type 2 diabetes mellitus with diabetic chronic kidney disease: Secondary | ICD-10-CM | POA: Diagnosis not present

## 2017-09-10 DIAGNOSIS — I129 Hypertensive chronic kidney disease with stage 1 through stage 4 chronic kidney disease, or unspecified chronic kidney disease: Secondary | ICD-10-CM | POA: Diagnosis not present

## 2017-09-10 DIAGNOSIS — I2510 Atherosclerotic heart disease of native coronary artery without angina pectoris: Secondary | ICD-10-CM | POA: Diagnosis not present

## 2017-09-10 DIAGNOSIS — S62306D Unspecified fracture of fifth metacarpal bone, right hand, subsequent encounter for fracture with routine healing: Secondary | ICD-10-CM | POA: Diagnosis not present

## 2017-09-10 DIAGNOSIS — S0281XD Fracture of other specified skull and facial bones, right side, subsequent encounter for fracture with routine healing: Secondary | ICD-10-CM | POA: Diagnosis not present

## 2017-09-10 DIAGNOSIS — M21372 Foot drop, left foot: Secondary | ICD-10-CM | POA: Diagnosis not present

## 2017-09-14 DIAGNOSIS — I129 Hypertensive chronic kidney disease with stage 1 through stage 4 chronic kidney disease, or unspecified chronic kidney disease: Secondary | ICD-10-CM | POA: Diagnosis not present

## 2017-09-14 DIAGNOSIS — S0281XD Fracture of other specified skull and facial bones, right side, subsequent encounter for fracture with routine healing: Secondary | ICD-10-CM | POA: Diagnosis not present

## 2017-09-14 DIAGNOSIS — E1122 Type 2 diabetes mellitus with diabetic chronic kidney disease: Secondary | ICD-10-CM | POA: Diagnosis not present

## 2017-09-14 DIAGNOSIS — M21372 Foot drop, left foot: Secondary | ICD-10-CM | POA: Diagnosis not present

## 2017-09-14 DIAGNOSIS — S62306D Unspecified fracture of fifth metacarpal bone, right hand, subsequent encounter for fracture with routine healing: Secondary | ICD-10-CM | POA: Diagnosis not present

## 2017-09-14 DIAGNOSIS — I2510 Atherosclerotic heart disease of native coronary artery without angina pectoris: Secondary | ICD-10-CM | POA: Diagnosis not present

## 2017-09-16 DIAGNOSIS — E1122 Type 2 diabetes mellitus with diabetic chronic kidney disease: Secondary | ICD-10-CM | POA: Diagnosis not present

## 2017-09-16 DIAGNOSIS — I129 Hypertensive chronic kidney disease with stage 1 through stage 4 chronic kidney disease, or unspecified chronic kidney disease: Secondary | ICD-10-CM | POA: Diagnosis not present

## 2017-09-16 DIAGNOSIS — M21372 Foot drop, left foot: Secondary | ICD-10-CM | POA: Diagnosis not present

## 2017-09-16 DIAGNOSIS — S0281XD Fracture of other specified skull and facial bones, right side, subsequent encounter for fracture with routine healing: Secondary | ICD-10-CM | POA: Diagnosis not present

## 2017-09-16 DIAGNOSIS — I2510 Atherosclerotic heart disease of native coronary artery without angina pectoris: Secondary | ICD-10-CM | POA: Diagnosis not present

## 2017-09-16 DIAGNOSIS — S62306D Unspecified fracture of fifth metacarpal bone, right hand, subsequent encounter for fracture with routine healing: Secondary | ICD-10-CM | POA: Diagnosis not present

## 2017-09-21 ENCOUNTER — Telehealth: Payer: Self-pay | Admitting: Neurology

## 2017-09-21 ENCOUNTER — Ambulatory Visit
Admission: RE | Admit: 2017-09-21 | Discharge: 2017-09-21 | Disposition: A | Discharge status: Home / Self Care | Payer: Medicare HMO | Source: Ambulatory Visit | Attending: Neurology | Admitting: Neurology

## 2017-09-21 DIAGNOSIS — R413 Other amnesia: Secondary | ICD-10-CM

## 2017-09-21 DIAGNOSIS — R270 Ataxia, unspecified: Secondary | ICD-10-CM

## 2017-09-21 NOTE — Telephone Encounter (Signed)
Called patient with results of his MRI brain which did not show hydrocephalus.  He has generalized atrophy and white matter changes.  I also reviewed his work-up-date and suggested that the only other investigative testing is CSF analysis to evaluate for inflammatory changes (CIDP).  He had many questions about the lumbar puncture, risks of disability, benefits, and how it would change management.  I addressed all his concerns as best as I could.  If there is signs of inflammation, there may be a role for immunosuppressive medication.  I spent > 19 minutes on the phone with patient addressing his questions and concerns.  He will discuss CSF testing with his wife and call back, if he chooses to proceed.  Aerabella Galasso K. Posey Pronto, DO

## 2017-09-23 DIAGNOSIS — M21372 Foot drop, left foot: Secondary | ICD-10-CM | POA: Diagnosis not present

## 2017-09-24 ENCOUNTER — Telehealth: Payer: Self-pay | Admitting: Neurology

## 2017-09-24 NOTE — Telephone Encounter (Signed)
patient daughter called and would like the results of the MRI

## 2017-09-24 NOTE — Telephone Encounter (Signed)
Patient daughter also wants to talk to someone about the foot pain the patient is having

## 2017-09-27 NOTE — Telephone Encounter (Signed)
Called Abby back and left message that Dr. Posey Pronto is out until Wednesday but that I would give her these messages and call her back with results as soon as I get them.

## 2017-09-29 DIAGNOSIS — S92505A Nondisplaced unspecified fracture of left lesser toe(s), initial encounter for closed fracture: Secondary | ICD-10-CM | POA: Diagnosis not present

## 2017-09-29 NOTE — Telephone Encounter (Signed)
Please advise 

## 2017-09-29 NOTE — Telephone Encounter (Signed)
Called Abby and left message that it is ok for her dad to increase the gabapentin to 600 mg tid.

## 2017-09-29 NOTE — Telephone Encounter (Signed)
OK to increase gabapentin to 600mg  three times daily.

## 2017-10-04 ENCOUNTER — Encounter (INDEPENDENT_AMBULATORY_CARE_PROVIDER_SITE_OTHER): Payer: Self-pay | Admitting: Orthopedic Surgery

## 2017-10-04 ENCOUNTER — Ambulatory Visit (INDEPENDENT_AMBULATORY_CARE_PROVIDER_SITE_OTHER): Payer: Medicare HMO | Admitting: Orthopedic Surgery

## 2017-10-04 VITALS — Ht 71.0 in | Wt 277.0 lb

## 2017-10-04 DIAGNOSIS — I87323 Chronic venous hypertension (idiopathic) with inflammation of bilateral lower extremity: Secondary | ICD-10-CM | POA: Diagnosis not present

## 2017-10-04 DIAGNOSIS — I998 Other disorder of circulatory system: Secondary | ICD-10-CM

## 2017-10-04 DIAGNOSIS — I70229 Atherosclerosis of native arteries of extremities with rest pain, unspecified extremity: Secondary | ICD-10-CM

## 2017-10-04 NOTE — Progress Notes (Signed)
Office Visit Note   Patient: [name]           Date of Birth: May 18, 1941           MRN: [id number] Visit Date: 10/04/2017              Requested by: Marchia Bond, MD [address] [place], [place] [address] PCP: Lavone Orn, MD  Chief Complaint  Patient presents with  . Left Foot - Pain      HPI: Patient is a 77 year old gentleman with a long history of peripheral vascular disease he has had abdominal aortic aneurysms he has had vascular intervention procedures in the past.  He states he has seen Dr. fields in the past and he states that his last examination he was told that everything was okay.  Patient presents at this time with critical limb ischemia of the left foot with a black gangrenous ulcer of the great toe  Patient also reports a history of venous insufficiency he states that he does have compression socks but he does not wear them.  He is using Lasix recently for the swelling in his legs which he states did not work.  States that he has had weeping edema from both legs..  Patient is seen today for a small avulsion fracture of the base of the proximal phalanx third toe left foot.  Assessment & Plan: Visit Diagnoses: No diagnosis found.  Plan: We will call Dr. Oneida Alar office for evaluation anticipate patient may need arterial studies.  Recommended compression socks for his venous insufficiency of both lower extremities.  Follow-Up Instructions: No follow-ups on file.   Ortho Exam  Patient is alert, oriented, no adenopathy, well-dressed, normal affect, normal respiratory effort. Examination I cannot palpate a dorsalis pedis or posterior tibial pulse on the left.  Patient has brawny skin color changes in both legs with pitting edema up to the tibial tubercle.  There is no ulcers no drainage.  Patient has an ischemic ulcer that is approximately 2 cm in diameter over the tip of the left great toe patient states that this occurred from rubbing in his  shoe wear.  Patient's toes are cold to the touch there extremely tender to light palpation and are blue in color.  The Doppler that I used today I could not doppler a pulse possibly due to the swelling.  Imaging: No results found. No images are attached to the encounter.  Labs: Lab Results  Component Value Date   HGBA1C 6.4 (H) 09/24/2011   HGBA1C 7.8 (H) 09/07/2011   HGBA1C 6.3 (H) 04/08/2011   REPTSTATUS 09/25/2011 FINAL 09/23/2011   CULT ESCHERICHIA COLI 09/23/2011   LABORGA ESCHERICHIA COLI 09/23/2011     Lab Results  Component Value Date   ALBUMIN 3.2 (L) 09/07/2011   ALBUMIN 3.5 04/03/2011    Body mass index is 38.63 kg/m.  Orders:  No orders of the defined types were placed in this encounter.  No orders of the defined types were placed in this encounter.    Procedures: No procedures performed  Clinical Data: No additional findings.  ROS:  All other systems negative, except as noted in the HPI. Review of Systems  Objective: Vital Signs: Ht 5\' 11"  (1.803 m)   Wt 277 lb (125.6 kg)   BMI 38.63 kg/m   Specialty Comments:  No specialty comments available.  PMFS History: Patient Active Problem List   Diagnosis Date Noted  . Polyneuropathy associated with underlying disease ([place]) 08/16/2017  .  Essential hypertension 02/06/2014  . Chronic diastolic heart failure ([place]) 02/06/2014  . Chronic kidney disease, stage III (moderate) ([place]) 02/06/2014  . Aftercare following surgery of the circulatory system, [place] 01/25/2014  . Carotid stenosis 06/08/2013  . Stricture of artery ([place]) 04/14/2012  . Peripheral vascular disease, unspecified ([place]) 10/08/2011  . DVT of upper extremity (deep vein thrombosis), left 09/29/2011  . S/P CABG (coronary artery bypass graft) 09/29/2011  . H/O carotid endarterectomy 09/29/2011  . Occlusion and stenosis of carotid artery without mention of cerebral infarction 06/11/2011  . Diabetes mellitus type 2 with complications ([place])  17/51/0258   Past Medical History:  Diagnosis Date  . Arthritis   . Atherosclerotic heart disease   . Back pain   . Bruises easily    takes Pletal daily and ASA 325mg  daily  . Cataract immature    right  . Chest pain   . Colon polyps   . Cyst    on perineum;takes Doxycycline daily  . Diabetes mellitus    takes Glipizide,Metformin,and Actos daily  . Diarrhea   . Edema    right leg knee down swollen since fall 3 wks ago  . GERD (gastroesophageal reflux disease)    Rolaids as needed-occ reflux  . Hemorrhoids   . History of kidney stones    at age 89   . Hyperlipidemia    takes Tricor and Lipitor daily  . Hyperlipidemia   . Hypertension    takes Altace,Amlodipine,and Nadolol daily  . Kidney stone    35+yrs ago  . Muscle pain   . Nasal polyps    hx of  . Peripheral edema    wears knee length hose-told by podiatrist to wear these  . Peripheral neuropathy   . Peripheral neuropathy    takes Gabapentin daily  . Pneumonia    as a child  . PVD (peripheral vascular disease) ([place])   . Renal insufficiency   . Seasonal allergies    takes Mucinex and Zyrtec prn  . Urinary frequency    urgency/takes Vesicare daily    Family History  Problem Relation Age of Onset  . Heart disease Mother   . Heart disease Father   . Anesthesia problems Neg Hx   . Hypotension Neg Hx   . Malignant hyperthermia Neg Hx   . Pseudochol deficiency Neg Hx     Past Surgical History:  Procedure Laterality Date  . Aortobifemoral bypass    . CARDIAC CATHETERIZATION  most recent 05/2011   total of 4  . CAROTID ANGIOGRAM N/A 06/05/2011   Procedure: CAROTID ANGIOGRAM;  Surgeon: Elam Dutch, MD;  Location: [facility] CATH LAB;  Service: Cardiovascular;  Laterality: N/A;  . CAROTID ENDARTERECTOMY  04/08/2011   left CEA  . cataract surgery     left  . COLONOSCOPY    . COLONOSCOPY WITH PROPOFOL N/A 08/22/2013   Procedure: COLONOSCOPY WITH PROPOFOL;  Surgeon: Garlan Fair, MD;  Location: [facility] ENDOSCOPY;   Service: Endoscopy;  Laterality: N/A;  . CORONARY ARTERY BYPASS GRAFT  09/09/2011   Procedure: CORONARY ARTERY BYPASS GRAFTING (CABG);  Surgeon: Gaye Pollack, MD;  Location: [facility];  Service: Open Heart Surgery;  Laterality: N/A;  Coronary artery bypass grafting  x three with Right saphenous vein harvested endoscopically and left internal mammary artery  . ENDARTERECTOMY  04/08/2011   Procedure: ENDARTERECTOMY CAROTID;  Surgeon: Elam Dutch, MD;  Location: [facility] OR;  Service: Vascular;  Laterality: Left;  with patch angioplasty  . ENDARTERECTOMY  09/09/2011   Procedure: ENDARTERECTOMY CAROTID;  Surgeon: Elam Dutch, MD;  [facility];  Service: Vascular;  Laterality: Right;  with patch angioplasty  . KNEE ARTHROSCOPY  2011  . LEFT HEART CATHETERIZATION WITH CORONARY ANGIOGRAM N/A 07/09/2011   Procedure: LEFT HEART CATHETERIZATION WITH CORONARY ANGIOGRAM;  Surgeon: Sinclair Grooms, MD;  Location: [facility] CATH LAB;  Service: Cardiovascular;  Laterality: N/A;  . Reimplantation of inferior mesenteric artery    . Repair of infrarenal abdominal aortic aneurysm    . SHOULDER ARTHROSCOPY W/ ROTATOR CUFF REPAIR     right and left-open procedures  . TONSILLECTOMY     at age 28  . TRIGGER FINGER RELEASE Right 06/28/2014   Procedure: RIGHT LONG TRIGGER RELEASE ;  Surgeon: Leanora Cover, MD;  Location: [facility];  Service: Orthopedics;  Laterality: Right;  . wisdom teeth extracted     as a teenager   Social History   Occupational History  . Occupation: retired Chief Executive Officer  Tobacco Use  . Smoking status: Former Smoker    Years: 40.00    Types: Cigarettes    Last attempt to quit: 10/27/2005    Years since quitting: 11.9  . Smokeless tobacco: Never Used  Substance and Sexual Activity  . Alcohol use: Yes    Alcohol/week: 0.0 oz    Types: 2 - 3 Cans of beer per week    Comment: occasional- weekly  . Drug use: No  . Sexual activity: Never

## 2017-10-08 ENCOUNTER — Encounter: Payer: Self-pay | Admitting: *Deleted

## 2017-10-08 ENCOUNTER — Ambulatory Visit (INDEPENDENT_AMBULATORY_CARE_PROVIDER_SITE_OTHER): Payer: Medicare HMO | Admitting: Family

## 2017-10-08 ENCOUNTER — Other Ambulatory Visit: Payer: Self-pay | Admitting: *Deleted

## 2017-10-08 ENCOUNTER — Other Ambulatory Visit: Payer: Self-pay

## 2017-10-08 ENCOUNTER — Ambulatory Visit (HOSPITAL_COMMUNITY)
Admission: RE | Admit: 2017-10-08 | Discharge: 2017-10-08 | Disposition: A | Discharge status: Home / Self Care | Payer: Medicare HMO | Source: Ambulatory Visit | Attending: Family | Admitting: Family

## 2017-10-08 ENCOUNTER — Encounter: Payer: Self-pay | Admitting: Family

## 2017-10-08 VITALS — BP 150/62 | HR 48 | Temp 97.1°F | Resp 20 | Ht 71.0 in | Wt 261.0 lb

## 2017-10-08 DIAGNOSIS — L97521 Non-pressure chronic ulcer of other part of left foot limited to breakdown of skin: Secondary | ICD-10-CM

## 2017-10-08 DIAGNOSIS — I998 Other disorder of circulatory system: Secondary | ICD-10-CM | POA: Diagnosis not present

## 2017-10-08 DIAGNOSIS — I779 Disorder of arteries and arterioles, unspecified: Secondary | ICD-10-CM

## 2017-10-08 DIAGNOSIS — Z95828 Presence of other vascular implants and grafts: Secondary | ICD-10-CM | POA: Diagnosis not present

## 2017-10-08 DIAGNOSIS — I999 Unspecified disorder of circulatory system: Secondary | ICD-10-CM

## 2017-10-08 DIAGNOSIS — M79672 Pain in left foot: Secondary | ICD-10-CM

## 2017-10-08 NOTE — Progress Notes (Signed)
Hx. of elevated CREAT. Reported to Santiago Glad to add with booking, and to be rechecked day of procedure.  Instructed patient to drink plenty of water till NPO for procedure. Instructed to be at [facility] admitting at 10 am on 10/11/17. Verbalized understanding.

## 2017-10-08 NOTE — H&P (View-Only) (Signed)
[facility] [place]   CC: Dry ulcer left great toe, ischemic pain of left foot, hx of  peripheral artery occlusive disease  History of Present Illness [name] is a 77 y.o. male who has known peripheral vascular disease and carotid disease.  He is s/p right CEA and aorta-innominate graft on 09/09/11 and left CEA 04/08/11,aortobifemoral bypass grafting (2007).  The patient denies symptoms of TIA, amaurosis, or stroke.  He does continue to have balance and weakness/numbness with ambulation and weakness/numbness in his legs. He stops to rest and he can go again.  Other medical problems include DM, HTN, Hypercholesterolemia and CAD. His health has been stable with out change   Pt was last evaluated by Dr. Oneida Alar and Jerilynn Mages. [employer] on 02-18-17. At that time patient's carotid occlusive disease was stable and asymptomatic. He had no significant carotid stenosis. He had decreased ABIs bilaterally but really had only minimal symptoms from this. He had some leg weakness but some of this also seems like neuropathy. He did not have any nonhealing wounds. Dr. Oneida Alar would only consider a revascularization procedure on him if he had nonhealing wounds and was at risk of limb loss due to the fact that he wouldrequire redo operation which would be high risk due to his obesity. Fortunately his symptoms were minimal at that point. Patient was to follow-up in one year with repeat ABIs and carotid duplex scan. He was to return sooner if he develops any wounds on his feet or has any symptoms of TIA amaurosis or stroke.  Pt states he needs a partial knee replacement, right first, then left. Cortisone shot in his knee did not help.  He is seeing Dr. Mardelle Matte.  Wife states pt falls a lot, in November 2018 he fell and fractured a bone in his left 5th metatarsal.  Pt states he was evaluated by a chiropractor, and states she told him that "no wonder he is falling, his brain has no idea  where his feet are".   Pt returns today at the request of Dr. Sharol Given. Dr. Jess Barters note on 10-04-17 includes the following:  [Examination I cannot palpate a dorsalis pedis or posterior tibial pulse on the left.  Patient has brawny skin color changes in both legs with pitting edema up to the tibial tubercle.  There is no ulcers no drainage.  Patient has an ischemic ulcer that is approximately 2 cm in diameter over the tip of the left great toe patient states that this occurred from rubbing in his shoe wear.  Patient's toes are cold to the touch there extremely tender to light palpation and are blue in color.  The Doppler that I used today I could not doppler a pulse possibly due to the swelling.]  Pt and his wife state that about 3 weeks ago he noted "blood blister" at the tip of his left great toe, he thinks his shoe was rubbing there, pt states that wife thinks he injured his left foot a few times.   He states that he has a fractured left 3rd toe, found on x-ray by Dr. Mardelle Matte on 09-29-16, Pt then saw Dr. Sharol Given on 10-04-17.   He has seen neurology, Dr. Deliah Boston, pt states she prescribed him a prosthesis on the left for foot drop; he wore it and it hurt too much.    His last serum creatinine result on file was 2.10 on 08-11-17.   Pt takes gabapentin. He states his PCP prescribed him some  hydrocodone when he saw him recently, to help with pain that has kept him awake at night since he wore the left foot prosthsis on May 2, states that is when the worst pain started  He has chronic venous insufficiency with swelling in his lower legs.   Diabetic: Yes, states his last A1C was 6.? Tobacco use: former smoker, quit in 2007, smoked x 40 years  Pt meds include: Statin :Yes Betablocker: Yes ASA: Yes Other anticoagulants/antiplatelets: no    Past Medical History:  Diagnosis Date  . Arthritis   . Atherosclerotic heart disease   . Back pain   . Bruises easily    takes Pletal daily and ASA 325mg   daily  . Cataract immature    right  . Chest pain   . Colon polyps   . Cyst    on perineum;takes Doxycycline daily  . Diabetes mellitus    takes Glipizide,Metformin,and Actos daily  . Diarrhea   . Edema    right leg knee down swollen since fall 3 wks ago  . GERD (gastroesophageal reflux disease)    Rolaids as needed-occ reflux  . Hemorrhoids   . History of kidney stones    at age 35   . Hyperlipidemia    takes Tricor and Lipitor daily  . Hyperlipidemia   . Hypertension    takes Altace,Amlodipine,and Nadolol daily  . Kidney stone    35+yrs ago  . Muscle pain   . Nasal polyps    hx of  . Peripheral edema    wears knee length hose-told by podiatrist to wear these  . Peripheral neuropathy   . Peripheral neuropathy    takes Gabapentin daily  . Pneumonia    as a child  . PVD (peripheral vascular disease) ([place])   . Renal insufficiency   . Seasonal allergies    takes Mucinex and Zyrtec prn  . Urinary frequency    urgency/takes Vesicare daily    Social History Social History   Tobacco Use  . Smoking status: Former Smoker    Years: 40.00    Types: Cigarettes    Last attempt to quit: 10/27/2005    Years since quitting: 11.9  . Smokeless tobacco: Never Used  Substance Use Topics  . Alcohol use: Yes    Alcohol/week: 0.0 oz    Types: 2 - 3 Cans of beer per week    Comment: occasional- weekly  . Drug use: No    Family History Family History  Problem Relation Age of Onset  . Heart disease Mother   . Heart disease Father   . Anesthesia problems Neg Hx   . Hypotension Neg Hx   . Malignant hyperthermia Neg Hx   . Pseudochol deficiency Neg Hx     Past Surgical History:  Procedure Laterality Date  . Aortobifemoral bypass    . CARDIAC CATHETERIZATION  most recent 05/2011   total of 4  . CAROTID ANGIOGRAM N/A 06/05/2011   Procedure: CAROTID ANGIOGRAM;  Surgeon: Elam Dutch, MD;  Location: [facility] CATH LAB;  Service: Cardiovascular;  Laterality: N/A;  . CAROTID  ENDARTERECTOMY  04/08/2011   left CEA  . cataract surgery     left  . COLONOSCOPY    . COLONOSCOPY WITH PROPOFOL N/A 08/22/2013   Procedure: COLONOSCOPY WITH PROPOFOL;  Surgeon: Garlan Fair, MD;  Location: [facility] ENDOSCOPY;  Service: Endoscopy;  Laterality: N/A;  . CORONARY ARTERY BYPASS GRAFT  09/09/2011   Procedure: CORONARY ARTERY BYPASS GRAFTING (CABG);  Surgeon: Gaspar Bidding  Alveria Apley, MD;  Location: [facility];  Service: Open Heart Surgery;  Laterality: N/A;  Coronary artery bypass grafting  x three with Right saphenous vein harvested endoscopically and left internal mammary artery  . ENDARTERECTOMY  04/08/2011   Procedure: ENDARTERECTOMY CAROTID;  Surgeon: Elam Dutch, MD;  Location: [facility] OR;  Service: Vascular;  Laterality: Left;  with patch angioplasty  . ENDARTERECTOMY  09/09/2011   Procedure: ENDARTERECTOMY CAROTID;  Surgeon: Elam Dutch, MD;  Location: [facility] OR;  Service: Vascular;  Laterality: Right;  with patch angioplasty  . KNEE ARTHROSCOPY  2011  . LEFT HEART CATHETERIZATION WITH CORONARY ANGIOGRAM N/A 07/09/2011   Procedure: LEFT HEART CATHETERIZATION WITH CORONARY ANGIOGRAM;  Surgeon: Sinclair Grooms, MD;  Location: [facility] CATH LAB;  Service: Cardiovascular;  Laterality: N/A;  . Reimplantation of inferior mesenteric artery    . Repair of infrarenal abdominal aortic aneurysm    . SHOULDER ARTHROSCOPY W/ ROTATOR CUFF REPAIR     right and left-open procedures  . TONSILLECTOMY     at age 66  . TRIGGER FINGER RELEASE Right 06/28/2014   Procedure: RIGHT LONG TRIGGER RELEASE ;  Surgeon: Leanora Cover, MD;  Location: [facility];  Service: Orthopedics;  Laterality: Right;  . wisdom teeth extracted     as a teenager    Allergies  Allergen Reactions  . Amoxicillin-Pot Clavulanate Nausea Only    Augmentin     Current Outpatient Medications  Medication Sig Dispense Refill  . AMBULATORY NON FORMULARY MEDICATION 1 Units by Other route daily. Left AFO 1 Units 0  . aspirin 325 MG  EC tablet Take 325 mg by mouth every evening.     Marland Kitchen atorvastatin (LIPITOR) 20 MG tablet Take 20 mg by mouth every evening.     Marland Kitchen b complex vitamins capsule Take 1 capsule by mouth daily.      . Cetirizine HCl (ZYRTEC ALLERGY PO) Take 10 tablets by mouth daily as needed (allergies). For allergies    . Cyanocobalamin (VITAMIN B 12 PO) Take 1 tablet by mouth every other day.    Marland Kitchen DM-Phenylephrine-Acetaminophen (VICKS DAYQUIL COLD & FLU) 10-5-325 MG/15ML LIQD Take by mouth.    . erythromycin ophthalmic ointment USE A SMALL AMOUNT IN THE LEFT EYE TID AS DIRECTED  2  . fenofibrate (TRICOR) 145 MG tablet Take 145 mg by mouth every morning.    . furosemide (LASIX) 40 MG tablet Take 40 mg by mouth daily as needed for fluid or edema.    . gabapentin (NEURONTIN) 300 MG capsule Take two (2) capsules (600 mg total) by mouth every morning and take one (1) capsule (300 mg total) by mouth every evening. Can take and additional dose one (1) capsule (300 mg total) by mouth as needed for nerve pain.    Marland Kitchen levothyroxine (SYNTHROID, LEVOTHROID) 50 MCG tablet Take 50 mcg by mouth daily before breakfast.    . Loperamide HCl (IMODIUM PO) Take 2 mg by mouth daily as needed. For diarrhea    . methylcellulose (ARTIFICIAL TEARS) 1 % ophthalmic solution Place 1 drop into both eyes 2 (two) times daily as needed (dry eyes).    . metoprolol (LOPRESSOR) 50 MG tablet Take 50 mg by mouth 2 (two) times daily.    Marland Kitchen morphine (MSIR) 15 MG tablet Take 1 tablet (15 mg total) by mouth every 4 (four) hours as needed for severe pain. 7 tablet 0  . nitroGLYCERIN (NITROSTAT) 0.4 MG SL tablet PLACE 1 TABLET UNDER TONGUE  EVERY 5 MINUTES AS NEEDED FOR CHEST PAIN. 75 tablet 1  . pioglitazone (ACTOS) 15 MG tablet Take 30 mg by mouth daily. Takes 2 15mg  QD    . potassium chloride SA (K-DUR,KLOR-CON) 20 MEQ tablet Take 20 mEq by mouth daily as needed (Take with lasix).    . pseudoephedrine-guaifenesin (MUCINEX D) 60-600 MG per tablet Take 1 tablet by  mouth every 12 (twelve) hours as needed for congestion. Congestion    . ramipril (ALTACE) 10 MG capsule Take 10 mg by mouth daily.    . sodium chloride (OCEAN) 0.65 % SOLN nasal spray Place 1 spray into both nostrils 2 (two) times daily as needed for congestion.    . temazepam (RESTORIL) 15 MG capsule Take 15 mg by mouth at bedtime as needed. For sleep     No current facility-administered medications for this visit.     ROS: See HPI for pertinent positives and negatives.   Physical Examination  Vitals:   10/08/17 1131 10/08/17 1138  BP: (!) 152/60 (!) 150/62  Pulse: (!) 48   Resp: 20   Temp: (!) 97.1 F (36.2 C)   TempSrc: Oral   SpO2: 96%   Weight: 261 lb (118.4 kg)   Height: 5\' 11"  (1.803 m)    Body mass index is 36.4 kg/m.  General: WDWN obese male in NAD Gait: slow, using walker HENT: Large neck Eyes: PERRLA Pulmonary: normal non-labored breathing, distant breath sounds in all fields, CTAB, no rales, rhonchi, or wheezing Cardiac: RRR, + low grade murmur Abdomen: soft, NT, no masses palpated, large panus Skin: no rashes, see Extremities  VASCULAR EXAM  Carotid Bruits Right Left   Negative Negative      Radial pulses are 1+ palpable bilaterally   Adominal aortic pulse is not palpable                      VASCULAR EXAM: Extremities without ischemic changes, without Gangrene; without open wounds.Bilateral pretibial pitting edema: 1+ right, trace left.    Dusky appearance of left toes and forefoot. Dry ulcer left great toe tip (see photos). Left foot painful to touch.   Left foot                                                                                                                                                              LE Pulses Right Left       FEMORAL  not palpable, obese, large panus  not palpable        POPLITEAL  not palpable   not palpable       POSTERIOR TIBIAL  not palpable, no Doppler signal   not palpable, no Doppler  signal        DORSALIS PEDIS  ANTERIOR TIBIAL Not palpable, + Doppler signal  Not palpable, muffled Doppler signal        PERONEAL + Doppler signal No Doppler signal    Musculoskeletal: no muscle wasting or atrophy. M/S 4/5 throughout.  Neurologic:  A&O X 3; appropriate affect, sensation is diminished in feet; speech is normal, CN 2-12 intact, motor exam as listed above. Psychiatric: Thought content is normal, mood appropriate for clinical situation.     ASSESSMENT: [name] is a 77 y.o. male who has known peripheral vascular disease and carotid disease.  He is s/p right CEA and aorta-innominate graft on 09/09/11 and left CEA 04/08/11,aortobifemoral bypass grafting (2007).   He was contemplating knee surgery for painful knees, and wants Dr. Oneida Alar opinion re the adequacy of perfusion to his knees to heal. The patient denies symptoms of TIA, amaurosis, or stroke.   Aortobifemoral bypass graft appears patent on duplex today, limited visualization due to obese abdomen and pt ate today.  I discussed these results with Dr. Trula Slade, pt HPI, non palpable femoral pulses, ulcer at left great toe, and pt serum creatinine of 2.1 last month.  Will schedule aortogram with Dr. Oneida Alar for ASAP, possible intervention, for ischemic pain of left foot and ulcer left great toe.     DATA  Arterial duplex of aortobifemoral bypass graft (10/08/17): aortobifem bypass graft is patent where visualized. Due to time of day, pt not fasting, and body habitus, the proximal anastomosis was not well visualized. Turbulent flow color flow and stenotic velocities in the right and left groin native arteries.    ABI (Date: 02-18-17):  R:  ? ABI: 0.48 (0.61),  ? PT: waveform morphology not documented, appears monophasic ? DP: waveform morphology not documented, appears monophasic  ? TBI:  0.29  L:  ? ABI: 0.25 (was 0.27),  ? PT: waveform morphology not documented, appears  monophasic ? DP: waveform morphology not documented, appears monophasic  ? TBI: 0.00    PLAN:  Based on the patient's vascular studies and examination, and after discussing with Dr. Trula Slade, pt will be scheduled for aortogram with run off with Dr. Oneida Alar for ASAP, possible intervention, for ischemic pain of left foot and ulcer left great toe.   Carotid duplex in a year.   I discussed in depth with the patient the nature of atherosclerosis, and emphasized the importance of maximal medical management including strict control of blood pressure, blood glucose, and lipid levels, obtaining regular exercise, and continued cessation of smoking.  The patient is aware that without maximal medical management the underlying atherosclerotic disease process will progress, limiting the benefit of any interventions.  The patient was given information about PAD including signs, symptoms, treatment, what symptoms should prompt the patient to seek immediate medical care, and risk reduction measures to take.  Clemon Chambers, RN, MSN, FNP-C [facility] Phone: [phone]  Clinic MD: [place]  10/08/17 11:47 AM

## 2017-10-08 NOTE — Progress Notes (Signed)
[facility] [place]   CC: Dry ulcer left great toe, ischemic pain of left foot, hx of  peripheral artery occlusive disease  History of Present Illness [name] is a 77 y.o. male who has known peripheral vascular disease and carotid disease.  He is s/p right CEA and aorta-innominate graft on 09/09/11 and left CEA 04/08/11,aortobifemoral bypass grafting (2007).  The patient denies symptoms of TIA, amaurosis, or stroke.  He does continue to have balance and weakness/numbness with ambulation and weakness/numbness in his legs. He stops to rest and he can go again.  Other medical problems include DM, HTN, Hypercholesterolemia and CAD. His health has been stable with out change   Pt was last evaluated by Dr. Oneida Alar and Jerilynn Mages. [employer] on 02-18-17. At that time patient's carotid occlusive disease was stable and asymptomatic. He had no significant carotid stenosis. He had decreased ABIs bilaterally but really had only minimal symptoms from this. He had some leg weakness but some of this also seems like neuropathy. He did not have any nonhealing wounds. Dr. Oneida Alar would only consider a revascularization procedure on him if he had nonhealing wounds and was at risk of limb loss due to the fact that he wouldrequire redo operation which would be high risk due to his obesity. Fortunately his symptoms were minimal at that point. Patient was to follow-up in one year with repeat ABIs and carotid duplex scan. He was to return sooner if he develops any wounds on his feet or has any symptoms of TIA amaurosis or stroke.  Pt states he needs a partial knee replacement, right first, then left. Cortisone shot in his knee did not help.  He is seeing Dr. Mardelle Matte.  Wife states pt falls a lot, in November 2018 he fell and fractured a bone in his left 5th metatarsal.  Pt states he was evaluated by a chiropractor, and states she told him that "no wonder he is falling, his brain has no idea  where his feet are".   Pt returns today at the request of Dr. Sharol Given. Dr. Jess Barters note on 10-04-17 includes the following:  [Examination I cannot palpate a dorsalis pedis or posterior tibial pulse on the left.  Patient has brawny skin color changes in both legs with pitting edema up to the tibial tubercle.  There is no ulcers no drainage.  Patient has an ischemic ulcer that is approximately 2 cm in diameter over the tip of the left great toe patient states that this occurred from rubbing in his shoe wear.  Patient's toes are cold to the touch there extremely tender to light palpation and are blue in color.  The Doppler that I used today I could not doppler a pulse possibly due to the swelling.]  Pt and his wife state that about 3 weeks ago he noted "blood blister" at the tip of his left great toe, he thinks his shoe was rubbing there, pt states that wife thinks he injured his left foot a few times.   He states that he has a fractured left 3rd toe, found on x-ray by Dr. Mardelle Matte on 09-29-16, Pt then saw Dr. Sharol Given on 10-04-17.   He has seen neurology, Dr. Deliah Boston, pt states she prescribed him a prosthesis on the left for foot drop; he wore it and it hurt too much.    His last serum creatinine result on file was 2.10 on 08-11-17.   Pt takes gabapentin. He states his PCP prescribed him some  hydrocodone when he saw him recently, to help with pain that has kept him awake at night since he wore the left foot prosthsis on May 2, states that is when the worst pain started  He has chronic venous insufficiency with swelling in his lower legs.   Diabetic: Yes, states his last A1C was 6.? Tobacco use: former smoker, quit in 2007, smoked x 40 years  Pt meds include: Statin :Yes Betablocker: Yes ASA: Yes Other anticoagulants/antiplatelets: no    Past Medical History:  Diagnosis Date  . Arthritis   . Atherosclerotic heart disease   . Back pain   . Bruises easily    takes Pletal daily and ASA 325mg   daily  . Cataract immature    right  . Chest pain   . Colon polyps   . Cyst    on perineum;takes Doxycycline daily  . Diabetes mellitus    takes Glipizide,Metformin,and Actos daily  . Diarrhea   . Edema    right leg knee down swollen since fall 3 wks ago  . GERD (gastroesophageal reflux disease)    Rolaids as needed-occ reflux  . Hemorrhoids   . History of kidney stones    at age 57   . Hyperlipidemia    takes Tricor and Lipitor daily  . Hyperlipidemia   . Hypertension    takes Altace,Amlodipine,and Nadolol daily  . Kidney stone    35+yrs ago  . Muscle pain   . Nasal polyps    hx of  . Peripheral edema    wears knee length hose-told by podiatrist to wear these  . Peripheral neuropathy   . Peripheral neuropathy    takes Gabapentin daily  . Pneumonia    as a child  . PVD (peripheral vascular disease) ([place])   . Renal insufficiency   . Seasonal allergies    takes Mucinex and Zyrtec prn  . Urinary frequency    urgency/takes Vesicare daily    Social History Social History   Tobacco Use  . Smoking status: Former Smoker    Years: 40.00    Types: Cigarettes    Last attempt to quit: 10/27/2005    Years since quitting: 11.9  . Smokeless tobacco: Never Used  Substance Use Topics  . Alcohol use: Yes    Alcohol/week: 0.0 oz    Types: 2 - 3 Cans of beer per week    Comment: occasional- weekly  . Drug use: No    Family History Family History  Problem Relation Age of Onset  . Heart disease Mother   . Heart disease Father   . Anesthesia problems Neg Hx   . Hypotension Neg Hx   . Malignant hyperthermia Neg Hx   . Pseudochol deficiency Neg Hx     Past Surgical History:  Procedure Laterality Date  . Aortobifemoral bypass    . CARDIAC CATHETERIZATION  most recent 05/2011   total of 4  . CAROTID ANGIOGRAM N/A 06/05/2011   Procedure: CAROTID ANGIOGRAM;  Surgeon: Elam Dutch, MD;  Location: [facility] CATH LAB;  Service: Cardiovascular;  Laterality: N/A;  . CAROTID  ENDARTERECTOMY  04/08/2011   left CEA  . cataract surgery     left  . COLONOSCOPY    . COLONOSCOPY WITH PROPOFOL N/A 08/22/2013   Procedure: COLONOSCOPY WITH PROPOFOL;  Surgeon: Garlan Fair, MD;  Location: [facility] ENDOSCOPY;  Service: Endoscopy;  Laterality: N/A;  . CORONARY ARTERY BYPASS GRAFT  09/09/2011   Procedure: CORONARY ARTERY BYPASS GRAFTING (CABG);  Surgeon: Gaspar Bidding  Alveria Apley, MD;  Location: [place];  Service: Open Heart Surgery;  Laterality: N/A;  Coronary artery bypass grafting  x three with Right saphenous vein harvested endoscopically and left internal mammary artery  . ENDARTERECTOMY  04/08/2011   Procedure: ENDARTERECTOMY CAROTID;  Surgeon: Elam Dutch, MD;  Location: [facility] OR;  Service: Vascular;  Laterality: Left;  with patch angioplasty  . ENDARTERECTOMY  09/09/2011   Procedure: ENDARTERECTOMY CAROTID;  Surgeon: Elam Dutch, MD;  Location: [facility] OR;  Service: Vascular;  Laterality: Right;  with patch angioplasty  . KNEE ARTHROSCOPY  2011  . LEFT HEART CATHETERIZATION WITH CORONARY ANGIOGRAM N/A 07/09/2011   Procedure: LEFT HEART CATHETERIZATION WITH CORONARY ANGIOGRAM;  Surgeon: Sinclair Grooms, MD;  Location[facility] CATH LAB;  Service: Cardiovascular;  Laterality: N/A;  . Reimplantation of inferior mesenteric artery    . Repair of infrarenal abdominal aortic aneurysm    . SHOULDER ARTHROSCOPY W/ ROTATOR CUFF REPAIR     right and left-open procedures  . TONSILLECTOMY     at age 80  . TRIGGER FINGER RELEASE Right 06/28/2014   Procedure: RIGHT LONG TRIGGER RELEASE ;  Surgeon: Leanora Cover, MD;  Location: [place];  Service: Orthopedics;  Laterality: Right;  . wisdom teeth extracted     as a teenager    Allergies  Allergen Reactions  . Amoxicillin-Pot Clavulanate Nausea Only    Augmentin     Current Outpatient Medications  Medication Sig Dispense Refill  . AMBULATORY NON FORMULARY MEDICATION 1 Units by Other route daily. Left AFO 1 Units 0  . aspirin 325 MG  EC tablet Take 325 mg by mouth every evening.     Marland Kitchen atorvastatin (LIPITOR) 20 MG tablet Take 20 mg by mouth every evening.     Marland Kitchen b complex vitamins capsule Take 1 capsule by mouth daily.      . Cetirizine HCl (ZYRTEC ALLERGY PO) Take 10 tablets by mouth daily as needed (allergies). For allergies    . Cyanocobalamin (VITAMIN B 12 PO) Take 1 tablet by mouth every other day.    Marland Kitchen DM-Phenylephrine-Acetaminophen (VICKS DAYQUIL COLD & FLU) 10-5-325 MG/15ML LIQD Take by mouth.    . erythromycin ophthalmic ointment USE A SMALL AMOUNT IN THE LEFT EYE TID AS DIRECTED  2  . fenofibrate (TRICOR) 145 MG tablet Take 145 mg by mouth every morning.    . furosemide (LASIX) 40 MG tablet Take 40 mg by mouth daily as needed for fluid or edema.    . gabapentin (NEURONTIN) 300 MG capsule Take two (2) capsules (600 mg total) by mouth every morning and take one (1) capsule (300 mg total) by mouth every evening. Can take and additional dose one (1) capsule (300 mg total) by mouth as needed for nerve pain.    Marland Kitchen levothyroxine (SYNTHROID, LEVOTHROID) 50 MCG tablet Take 50 mcg by mouth daily before breakfast.    . Loperamide HCl (IMODIUM PO) Take 2 mg by mouth daily as needed. For diarrhea    . methylcellulose (ARTIFICIAL TEARS) 1 % ophthalmic solution Place 1 drop into both eyes 2 (two) times daily as needed (dry eyes).    . metoprolol (LOPRESSOR) 50 MG tablet Take 50 mg by mouth 2 (two) times daily.    Marland Kitchen morphine (MSIR) 15 MG tablet Take 1 tablet (15 mg total) by mouth every 4 (four) hours as needed for severe pain. 7 tablet 0  . nitroGLYCERIN (NITROSTAT) 0.4 MG SL tablet PLACE 1 TABLET UNDER TONGUE  EVERY 5 MINUTES AS NEEDED FOR CHEST PAIN. 75 tablet 1  . pioglitazone (ACTOS) 15 MG tablet Take 30 mg by mouth daily. Takes 2 15mg  QD    . potassium chloride SA (K-DUR,KLOR-CON) 20 MEQ tablet Take 20 mEq by mouth daily as needed (Take with lasix).    . pseudoephedrine-guaifenesin (MUCINEX D) 60-600 MG per tablet Take 1 tablet by  mouth every 12 (twelve) hours as needed for congestion. Congestion    . ramipril (ALTACE) 10 MG capsule Take 10 mg by mouth daily.    . sodium chloride (OCEAN) 0.65 % SOLN nasal spray Place 1 spray into both nostrils 2 (two) times daily as needed for congestion.    . temazepam (RESTORIL) 15 MG capsule Take 15 mg by mouth at bedtime as needed. For sleep     No current facility-administered medications for this visit.     ROS: See HPI for pertinent positives and negatives.   Physical Examination  Vitals:   10/08/17 1131 10/08/17 1138  BP: (!) 152/60 (!) 150/62  Pulse: (!) 48   Resp: 20   Temp: (!) 97.1 F (36.2 C)   TempSrc: Oral   SpO2: 96%   Weight: 261 lb (118.4 kg)   Height: 5\' 11"  (1.803 m)    Body mass index is 36.4 kg/m.  General: WDWN obese male in NAD Gait: slow, using walker HENT: Large neck Eyes: PERRLA Pulmonary: normal non-labored breathing, distant breath sounds in all fields, CTAB, no rales, rhonchi, or wheezing Cardiac: RRR, + low grade murmur Abdomen: soft, NT, no masses palpated, large panus Skin: no rashes, see Extremities  VASCULAR EXAM  Carotid Bruits Right Left   Negative Negative      Radial pulses are 1+ palpable bilaterally   Adominal aortic pulse is not palpable                      VASCULAR EXAM: Extremities without ischemic changes, without Gangrene; without open wounds.Bilateral pretibial pitting edema: 1+ right, trace left.    Dusky appearance of left toes and forefoot. Dry ulcer left great toe tip (see photos). Left foot painful to touch.   Left foot                                                                                                                                                              LE Pulses Right Left       FEMORAL  not palpable, obese, large panus  not palpable        POPLITEAL  not palpable   not palpable       POSTERIOR TIBIAL  not palpable, no Doppler signal   not palpable, no Doppler  signal        DORSALIS PEDIS  ANTERIOR TIBIAL Not palpable, + Doppler signal  Not palpable, muffled Doppler signal        PERONEAL + Doppler signal No Doppler signal    Musculoskeletal: no muscle wasting or atrophy. M/S 4/5 throughout.  Neurologic:  A&O X 3; appropriate affect, sensation is diminished in feet; speech is normal, CN 2-12 intact, motor exam as listed above. Psychiatric: Thought content is normal, mood appropriate for clinical situation.     ASSESSMENT: DAVIN ARCHULETTA is a 77 y.o. male who has known peripheral vascular disease and carotid disease.  He is s/p right CEA and aorta-innominate graft on 09/09/11 and left CEA 04/08/11,aortobifemoral bypass grafting (2007).   He was contemplating knee surgery for painful knees, and wants Dr. Oneida Alar opinion re the adequacy of perfusion to his knees to heal. The patient denies symptoms of TIA, amaurosis, or stroke.   Aortobifemoral bypass graft appears patent on duplex today, limited visualization due to obese abdomen and pt ate today.  I discussed these results with Dr. Trula Slade, pt HPI, non palpable femoral pulses, ulcer at left great toe, and pt serum creatinine of 2.1 last month.  Will schedule aortogram with Dr. Oneida Alar for ASAP, possible intervention, for ischemic pain of left foot and ulcer left great toe.     DATA  Arterial duplex of aortobifemoral bypass graft (10/08/17): aortobifem bypass graft is patent where visualized. Due to time of day, pt not fasting, and body habitus, the proximal anastomosis was not well visualized. Turbulent flow color flow and stenotic velocities in the right and left groin native arteries.    ABI (Date: 02-18-17):  R:  ? ABI: 0.48 (0.61),  ? PT: waveform morphology not documented, appears monophasic ? DP: waveform morphology not documented, appears monophasic  ? TBI:  0.29  L:  ? ABI: 0.25 (was 0.27),  ? PT: waveform morphology not documented, appears  monophasic ? DP: waveform morphology not documented, appears monophasic  ? TBI: 0.00    PLAN:  Based on the patient's vascular studies and examination, and after discussing with Dr. Trula Slade, pt will be scheduled for aortogram with run off with Dr. Oneida Alar for ASAP, possible intervention, for ischemic pain of left foot and ulcer left great toe.   Carotid duplex in a year.   I discussed in depth with the patient the nature of atherosclerosis, and emphasized the importance of maximal medical management including strict control of blood pressure, blood glucose, and lipid levels, obtaining regular exercise, and continued cessation of smoking.  The patient is aware that without maximal medical management the underlying atherosclerotic disease process will progress, limiting the benefit of any interventions.  The patient was given information about PAD including signs, symptoms, treatment, what symptoms should prompt the patient to seek immediate medical care, and risk reduction measures to take.  Clemon Chambers, RN, MSN, FNP-C [facility] [place] Phone: [phone]  Clinic MD: [place]  10/08/17 11:47 AM

## 2017-10-08 NOTE — Patient Instructions (Signed)

## 2017-10-11 ENCOUNTER — Telehealth: Payer: Self-pay | Admitting: Interventional Cardiology

## 2017-10-11 ENCOUNTER — Ambulatory Visit (HOSPITAL_COMMUNITY)
Admission: RE | Admit: 2017-10-11 | Discharge: 2017-10-11 | Disposition: A | Discharge status: Home / Self Care | Payer: Medicare HMO | Source: Ambulatory Visit | Attending: Vascular Surgery | Admitting: Vascular Surgery

## 2017-10-11 ENCOUNTER — Encounter

## 2017-10-11 ENCOUNTER — Ambulatory Visit (HOSPITAL_COMMUNITY)
Admission: RE | Disposition: A | Discharge status: Home / Self Care | Payer: Self-pay | Source: Ambulatory Visit | Attending: Vascular Surgery

## 2017-10-11 ENCOUNTER — Ambulatory Visit (HOSPITAL_COMMUNITY): Payer: Medicare HMO

## 2017-10-11 ENCOUNTER — Ambulatory Visit (HOSPITAL_BASED_OUTPATIENT_CLINIC_OR_DEPARTMENT_OTHER): Payer: Medicare HMO

## 2017-10-11 ENCOUNTER — Ambulatory Visit: Payer: Medicare Other | Admitting: Neurology

## 2017-10-11 DIAGNOSIS — Z01810 Encounter for preprocedural cardiovascular examination: Secondary | ICD-10-CM

## 2017-10-11 DIAGNOSIS — Z79899 Other long term (current) drug therapy: Secondary | ICD-10-CM | POA: Diagnosis not present

## 2017-10-11 DIAGNOSIS — E1142 Type 2 diabetes mellitus with diabetic polyneuropathy: Secondary | ICD-10-CM | POA: Insufficient documentation

## 2017-10-11 DIAGNOSIS — I998 Other disorder of circulatory system: Secondary | ICD-10-CM | POA: Insufficient documentation

## 2017-10-11 DIAGNOSIS — Z7982 Long term (current) use of aspirin: Secondary | ICD-10-CM | POA: Insufficient documentation

## 2017-10-11 DIAGNOSIS — L97521 Non-pressure chronic ulcer of other part of left foot limited to breakdown of skin: Secondary | ICD-10-CM | POA: Insufficient documentation

## 2017-10-11 DIAGNOSIS — E11621 Type 2 diabetes mellitus with foot ulcer: Secondary | ICD-10-CM | POA: Insufficient documentation

## 2017-10-11 DIAGNOSIS — Z79890 Hormone replacement therapy (postmenopausal): Secondary | ICD-10-CM | POA: Diagnosis not present

## 2017-10-11 DIAGNOSIS — M1990 Unspecified osteoarthritis, unspecified site: Secondary | ICD-10-CM | POA: Diagnosis not present

## 2017-10-11 DIAGNOSIS — Z86010 Personal history of colonic polyps: Secondary | ICD-10-CM | POA: Diagnosis not present

## 2017-10-11 DIAGNOSIS — R0602 Shortness of breath: Secondary | ICD-10-CM

## 2017-10-11 DIAGNOSIS — K219 Gastro-esophageal reflux disease without esophagitis: Secondary | ICD-10-CM | POA: Diagnosis not present

## 2017-10-11 DIAGNOSIS — Z9889 Other specified postprocedural states: Secondary | ICD-10-CM | POA: Insufficient documentation

## 2017-10-11 DIAGNOSIS — Z87442 Personal history of urinary calculi: Secondary | ICD-10-CM | POA: Insufficient documentation

## 2017-10-11 DIAGNOSIS — M79672 Pain in left foot: Secondary | ICD-10-CM | POA: Insufficient documentation

## 2017-10-11 DIAGNOSIS — R350 Frequency of micturition: Secondary | ICD-10-CM | POA: Insufficient documentation

## 2017-10-11 DIAGNOSIS — Z87891 Personal history of nicotine dependence: Secondary | ICD-10-CM | POA: Insufficient documentation

## 2017-10-11 DIAGNOSIS — Z880 Allergy status to penicillin: Secondary | ICD-10-CM | POA: Diagnosis not present

## 2017-10-11 DIAGNOSIS — I779 Disorder of arteries and arterioles, unspecified: Secondary | ICD-10-CM | POA: Diagnosis not present

## 2017-10-11 DIAGNOSIS — I10 Essential (primary) hypertension: Secondary | ICD-10-CM | POA: Insufficient documentation

## 2017-10-11 DIAGNOSIS — E785 Hyperlipidemia, unspecified: Secondary | ICD-10-CM | POA: Insufficient documentation

## 2017-10-11 DIAGNOSIS — Z955 Presence of coronary angioplasty implant and graft: Secondary | ICD-10-CM | POA: Diagnosis not present

## 2017-10-11 DIAGNOSIS — Z951 Presence of aortocoronary bypass graft: Secondary | ICD-10-CM | POA: Diagnosis not present

## 2017-10-11 DIAGNOSIS — Z8249 Family history of ischemic heart disease and other diseases of the circulatory system: Secondary | ICD-10-CM | POA: Diagnosis not present

## 2017-10-11 DIAGNOSIS — I70245 Atherosclerosis of native arteries of left leg with ulceration of other part of foot: Secondary | ICD-10-CM | POA: Diagnosis not present

## 2017-10-11 HISTORY — PX: LOWER EXTREMITY ANGIOGRAPHY: CATH118251

## 2017-10-11 LAB — POCT I-STAT, CHEM 8
BUN: 31 mg/dL — AB (ref 6–20)
CALCIUM ION: 1.16 mmol/L (ref 1.15–1.40)
Chloride: 105 mmol/L (ref 101–111)
Creatinine, Ser: 1.5 mg/dL — ABNORMAL HIGH (ref 0.61–1.24)
Glucose, Bld: 110 mg/dL — ABNORMAL HIGH (ref 65–99)
HEMATOCRIT: 34 % — AB (ref 39.0–52.0)
HEMOGLOBIN: 11.6 g/dL — AB (ref 13.0–17.0)
Potassium: 4.3 mmol/L (ref 3.5–5.1)
Sodium: 138 mmol/L (ref 135–145)
TCO2: 22 mmol/L (ref 22–32)

## 2017-10-11 LAB — GLUCOSE, CAPILLARY: Glucose-Capillary: 93 mg/dL (ref 65–99)

## 2017-10-11 SURGERY — LOWER EXTREMITY ANGIOGRAPHY
Anesthesia: LOCAL

## 2017-10-11 MED ORDER — SODIUM CHLORIDE 0.9 % IV SOLN
INTRAVENOUS | Status: DC
  Administered 2017-10-11: 11:00:00 via INTRAVENOUS

## 2017-10-11 MED ORDER — SODIUM CHLORIDE 0.9 % IV SOLN: 250.0000 mL | INTRAVENOUS | Status: DC | PRN

## 2017-10-11 MED ORDER — HYDROCODONE-ACETAMINOPHEN 5-325 MG PO TABS
ORAL_TABLET | ORAL | Status: AC
  Administered 2017-10-11: 1 via ORAL
  Filled 2017-10-11: qty 1

## 2017-10-11 MED ORDER — HEPARIN (PORCINE) IN NACL 2-0.9 UNITS/ML
INTRAMUSCULAR | Status: AC | PRN
  Administered 2017-10-11: 500 mL
  Administered 2017-10-11: 500 mL via INTRA_ARTERIAL

## 2017-10-11 MED ORDER — LIDOCAINE HCL (PF) 1 % IJ SOLN
INTRAMUSCULAR | Status: AC
  Filled 2017-10-11: qty 30

## 2017-10-11 MED ORDER — SODIUM CHLORIDE 0.9% FLUSH: 3.0000 mL | Freq: Two times a day (BID) | INTRAVENOUS | Status: DC

## 2017-10-11 MED ORDER — SODIUM CHLORIDE 0.9 % WEIGHT BASED INFUSION: 1.0000 mL/kg/h | INTRAVENOUS | Status: DC

## 2017-10-11 MED ORDER — HEPARIN (PORCINE) IN NACL 1000-0.9 UT/500ML-% IV SOLN
INTRAVENOUS | Status: AC
  Filled 2017-10-11: qty 1000

## 2017-10-11 MED ORDER — HYDRALAZINE HCL 20 MG/ML IJ SOLN: 5.0000 mg | INTRAMUSCULAR | Status: DC | PRN

## 2017-10-11 MED ORDER — OXYCODONE HCL 5 MG PO TABS
ORAL_TABLET | ORAL | Status: AC
  Administered 2017-10-11: 5 mg via ORAL
  Filled 2017-10-11: qty 2

## 2017-10-11 MED ORDER — MIDAZOLAM HCL 2 MG/2ML IJ SOLN
INTRAMUSCULAR | Status: AC
  Filled 2017-10-11: qty 2

## 2017-10-11 MED ORDER — OXYCODONE HCL 5 MG PO TABS
5.0000 mg | ORAL_TABLET | ORAL | Status: DC | PRN
  Administered 2017-10-11: 5 mg via ORAL

## 2017-10-11 MED ORDER — LABETALOL HCL 5 MG/ML IV SOLN
INTRAVENOUS | Status: AC
  Administered 2017-10-11: 10 mg via INTRAVENOUS
  Filled 2017-10-11: qty 4

## 2017-10-11 MED ORDER — FENTANYL CITRATE (PF) 100 MCG/2ML IJ SOLN
INTRAMUSCULAR | Status: DC | PRN
  Administered 2017-10-11: 50 ug via INTRAVENOUS

## 2017-10-11 MED ORDER — HYDRALAZINE HCL 20 MG/ML IJ SOLN
INTRAMUSCULAR | Status: DC | PRN
  Administered 2017-10-11 (×2): 10 mg via INTRAVENOUS

## 2017-10-11 MED ORDER — ACETAMINOPHEN 325 MG PO TABS: 650.0000 mg | ORAL_TABLET | ORAL | Status: DC | PRN

## 2017-10-11 MED ORDER — MORPHINE SULFATE (PF) 10 MG/ML IV SOLN: 2.0000 mg | INTRAVENOUS | Status: DC | PRN

## 2017-10-11 MED ORDER — ONDANSETRON HCL 4 MG/2ML IJ SOLN
4.0000 mg | Freq: Four times a day (QID) | INTRAMUSCULAR | Status: DC | PRN
Start: 2017-10-11 — End: 2017-10-11

## 2017-10-11 MED ORDER — HYDRALAZINE HCL 20 MG/ML IJ SOLN
INTRAMUSCULAR | Status: AC
  Filled 2017-10-11: qty 1

## 2017-10-11 MED ORDER — SODIUM CHLORIDE 0.9% FLUSH: 3.0000 mL | INTRAVENOUS | Status: DC | PRN

## 2017-10-11 MED ORDER — FENTANYL CITRATE (PF) 100 MCG/2ML IJ SOLN
INTRAMUSCULAR | Status: AC
  Filled 2017-10-11: qty 2

## 2017-10-11 MED ORDER — LABETALOL HCL 5 MG/ML IV SOLN
10.0000 mg | INTRAVENOUS | Status: DC | PRN
  Administered 2017-10-11: 10 mg via INTRAVENOUS

## 2017-10-11 MED ORDER — IODIXANOL 320 MG/ML IV SOLN
INTRAVENOUS | Status: DC | PRN
  Administered 2017-10-11: 90 mL via INTRAVENOUS

## 2017-10-11 MED ORDER — HYDROCODONE-ACETAMINOPHEN 5-325 MG PO TABS
1.0000 | ORAL_TABLET | Freq: Once | ORAL | Status: AC
  Administered 2017-10-11: 1 via ORAL

## 2017-10-11 MED ORDER — LIDOCAINE HCL (PF) 1 % IJ SOLN
INTRAMUSCULAR | Status: DC | PRN
  Administered 2017-10-11: 18 mL

## 2017-10-11 MED ORDER — MIDAZOLAM HCL 2 MG/2ML IJ SOLN
INTRAMUSCULAR | Status: DC | PRN
  Administered 2017-10-11: 1 mg via INTRAVENOUS

## 2017-10-11 SURGICAL SUPPLY — 11 items
CATH OMNI FLUSH 5F 65CM (CATHETERS) ×1 IMPLANT
COVER PRB 48X5XTLSCP FOLD TPE (BAG) IMPLANT
COVER PROBE 5X48 (BAG) ×2
HOVERMATT SINGLE USE (MISCELLANEOUS) ×1 IMPLANT
KIT MICROPUNCTURE NIT STIFF (SHEATH) ×1 IMPLANT
KIT PV (KITS) ×2 IMPLANT
SHEATH PINNACLE 5F 10CM (SHEATH) ×1 IMPLANT
SYR MEDRAD MARK V 150ML (SYRINGE) ×2 IMPLANT
TRANSDUCER W/STOPCOCK (MISCELLANEOUS) ×2 IMPLANT
TRAY PV CATH (CUSTOM PROCEDURE TRAY) ×2 IMPLANT
WIRE BENTSON .035X145CM (WIRE) ×1 IMPLANT

## 2017-10-11 NOTE — Op Note (Signed)
    Patient name: [name] MRN: [id number] DOB: 1940/07/08 Sex: male  10/11/2017 Pre-operative Diagnosis: Critical left lower extremity ischemia with left great toe ulceration Post-operative diagnosis:  Same Surgeon:  Erlene Quan C. Donzetta Matters, MD Procedure Performed: 1.  Ultrasound guided cannulation right common femoral artery 2.  CO2 aortogram with bilateral lower extremity runoff 3.  Moderate sedation with fentanyl and Versed for 20 minutes  Indications: 77 year old male history of aortobifemoral bypass grafting now has a wound on his left great toe.  He has been evaluated by Dr. Sharol Given and is now indicated for Joetta Manners with possible intervention on the left lower extremity.  Findings: Aortobifemoral bypass appears patent.  He has bilateral occlusive heavily calcified superficial femoral arteries that reconstitute popliteal arteries at the level of the knee.  On the right side there is dominant flow via the anterior tibial artery on the left side he appears to have three-vessel runoff although they are quite diminutive.  Next  Patient will need to be considered for left femoral to below-knee popliteal artery bypass grafting and vein mapping will be obtained today.   Procedure:  The patient was identified in the holding area and taken to room 8.  The patient was then placed supine on the table and prepped and draped in the usual sterile fashion.  A time out was called.  Ultrasound was used to evaluate the right common femoral artery and this was noted to be patent. An image was saved to the chart. The artery was cannulated under direct guidance  With a micropuncture needle and a sheath was placed over the wire.  We then exchanged for a Bentson wire and placed a 5 French sheath.  An Omni Flush catheter placed in the aorta at the level of L1 and CO2 aortogram was performed.  We then pulled down the bifurcation of the aortobifemoral bypass and performed CO2 contrast to the level of the knees where we  were noted to have bilateral occluded and heavily calcified SFAs.  Once we had reconstitution of the popliteals we performed contrast injections which demonstrated the above findings.   Patient will be considered for left femoral to popliteal artery bypass on the left for critical limb ischemia.  He tolerated this procedure well without any complication.  Contrast: 90cc   Lety Cullens C. Donzetta Matters, MD [facility] [place] Office: [phone] Pager: [phone]

## 2017-10-11 NOTE — Progress Notes (Signed)
Site area: Right groin a 5 french arterial sheath was removed  Site Prior to Removal:  Level 0  Pressure Applied For 20 MINUTES     Beginning at 1615am  Manual:   Yes.    Patient Status During Pull:  stable  Post Pull Groin Site:  Level 0  Post Pull Instructions Given:  Yes.    Post Pull Pulses Present:  Yes.    Dressing Applied:  Yes.    Comments:  VS remain stable

## 2017-10-11 NOTE — H&P (Signed)
   History and Physical Update  The patient was interviewed and re-examined.  The patient's previous History and Physical has been reviewed and is unchanged from recent office visit.  Plan for aortogram with possible intervention of the left lower extremity for left great toe ulcer.  Unfortunately has aortobifemoral bypass which may prohibit intervention today.  He demonstrates good understanding.  Beyonka Pitney C. Donzetta Matters, MD [facility] [place] Office: [phone] Pager: [phone]   10/11/2017, 12:49 PM

## 2017-10-11 NOTE — Discharge Instructions (Signed)
**Note -identified via Obfuscation** Femoral Site Care °Refer to this sheet in the next few weeks. These instructions provide you with information about caring for yourself after your procedure. Your health care provider may also give you more specific instructions. Your treatment has been planned according to current medical practices, but problems sometimes occur. Call your health care provider if you have any problems or questions after your procedure. °What can I expect after the procedure? °After your procedure, it is typical to have the following: °· Bruising at the site that usually fades within 1-2 weeks. °· Blood collecting in the tissue (hematoma) that may be painful to the touch. It should usually decrease in size and tenderness within 1-2 weeks. ° °Follow these instructions at home: °· Take medicines only as directed by your health care provider. °· You may shower 24-48 hours after the procedure or as directed by your health care provider. Remove the bandage (dressing) and gently wash the site with plain soap and water. Pat the area dry with a clean towel. Do not rub the site, because this may cause bleeding. °· Do not take baths, swim, or use a hot tub until your health care provider approves. °· Check your insertion site every day for redness, swelling, or drainage. °· Do not apply powder or lotion to the site. °· Limit use of stairs to twice a day for the first 2-3 days or as directed by your health care provider. °· Do not squat for the first 2-3 days or as directed by your health care provider. °· Do not lift over 10 lb (4.5 kg) for 5 days after your procedure or as directed by your health care provider. °· Ask your health care provider when it is okay to: °? Return to work or school. °? Resume usual physical activities or sports. °? Resume sexual activity. °· Do not drive home if you are discharged the same day as the procedure. Have someone else drive you. °· You may drive 24 hours after the procedure unless otherwise instructed by  your health care provider. °· Do not operate machinery or power tools for 24 hours after the procedure or as directed by your health care provider. °· If your procedure was done as an outpatient procedure, which means that you went home the same day as your procedure, a responsible adult should be with you for the first 24 hours after you arrive home. °· Keep all follow-up visits as directed by your health care provider. This is important. °Contact a health care provider if: °· You have a fever. °· You have chills. °· You have increased bleeding from the site. Hold pressure on the site. °Get help right away if: °· You have unusual pain at the site. °· You have redness, warmth, or swelling at the site. °· You have drainage (other than a small amount of blood on the dressing) from the site. °· The site is bleeding, and the bleeding does not stop after 30 minutes of holding steady pressure on the site. °· Your leg or foot becomes pale, cool, tingly, or numb. °This information is not intended to replace advice given to you by your health care provider. Make sure you discuss any questions you have with your health care provider. °Document Released: 01/12/2014 Document Revised: 10/17/2015 Document Reviewed: 11/28/2013 °Elsevier Interactive Patient Education © 2018 [employer] ° °

## 2017-10-11 NOTE — Progress Notes (Signed)
Bilateral Lower Extremity Vein Map  Right Great Saphenous Vein  Segment Diameter Comment  1. Origin 7.9 mm   2. High Thigh 3.9 mm   3. Mid Thigh mm Unable to visualize  4. Low Thigh mm Unable to visualize  5. At Knee mm Unable to visualize  6. High Calf mm Unable to visualize  7. Low Calf mm Unable to visualize  8. Ankle mm Unable to visualize    Left Great Saphenous Vein  Segment Diameter Comment  1. Origin 12.5 mm   2. High Thigh 7.4 mm Branch  3. Mid Thigh 6.4 mm Branch  4. Low Thigh 4.9 mm Branch  5. At Knee 4.2 mm   6. High Calf 3.7 mm   7. Low Calf 5.3 mm Branch  8. Ankle 4.7 mm Branch   10/11/17 6:03 PM [name] RVT

## 2017-10-11 NOTE — Telephone Encounter (Signed)
New message       [facility] Pre-operative Risk Assessment    Request for surgical clearance:  1. What type of surgery is being performed? Left Fempop   2. When is this surgery scheduled? TBD, waiting on clearance  3. What type of clearance is required (medical clearance vs. Pharmacy clearance to hold med vs. Both)? Cardiac clearance  4. Are there any medications that need to be held prior to surgery and how long? No.   5. Practice name and name of physician performing surgery? [facility]  6. What is your office phone number [phone]    7.   What is your office fax number [phone]  8.   Anesthesia type (None, local, MAC, general) ? [name] who is calling doesn't know.    [name] 10/11/2017, 4:20 PM  _________________________________________________________________   (provider comments below)

## 2017-10-11 NOTE — Progress Notes (Signed)
**Note -Identified via Obfuscation** Patient post lower extremity angiography. Patient complained of shortness of breath that is a little worse than normal. Patient's O2 sat 90-95% on 1.5L oxygen via nasal cannula. Dr. Donzetta Matters notified and chest xray ordered. Dr. Donzetta Matters visited patient prior to discharge time. Patient's blood pressure elevated; PRN labetalol 10 mg given via IV x1 (see vital sign trends). PRN oxycodone 5 mg by mouth administered for patient's complaint of foot pain. Patient stated that his shortness of breath improved and removed oxygen. Patient's O2 sat ranged from 89-94% on room air. No complications noted from procedural site. Fall/safety precautions implemented.

## 2017-10-12 ENCOUNTER — Encounter (HOSPITAL_COMMUNITY): Payer: Self-pay | Admitting: Vascular Surgery

## 2017-10-12 MED FILL — Heparin Sod (Porcine)-NaCl IV Soln 1000 Unit/500ML-0.9%: INTRAVENOUS | Qty: 1000 | Status: AC

## 2017-10-12 NOTE — Telephone Encounter (Signed)
Call placed to pt to review his condition since last visit in 04/2017. Home phone VM full. Message left on mobile to call back to preop clinic between 1:30 and 5p.

## 2017-10-13 ENCOUNTER — Other Ambulatory Visit: Payer: Self-pay | Admitting: *Deleted

## 2017-10-13 NOTE — Telephone Encounter (Signed)
   Primary Cardiologist: Sinclair Grooms, MD  Chart reviewed as part of pre-operative protocol coverage. Patient was contacted 10/13/2017 in reference to pre-operative risk assessment for pending surgery as outlined below.  [name] was last seen on 04/26/2017 by Dr. Tamala Julian.  Since that day, [name] has done well. He is easily getting > 4 mets of activity. He exercise regularly. Unknown prior anginal episode.   Therefore, based on ACC/AHA guidelines, the patient would be at acceptable risk for the planned procedure without further cardiovascular testing.   I will route this recommendation to the requesting party via Epic fax function and remove from pre-op pool.  Please call with questions.  [place], [place] 10/13/2017, 3:31 PM

## 2017-10-14 ENCOUNTER — Telehealth: Payer: Self-pay | Admitting: Interventional Cardiology

## 2017-10-14 DIAGNOSIS — S0281XD Fracture of other specified skull and facial bones, right side, subsequent encounter for fracture with routine healing: Secondary | ICD-10-CM | POA: Diagnosis not present

## 2017-10-14 DIAGNOSIS — I129 Hypertensive chronic kidney disease with stage 1 through stage 4 chronic kidney disease, or unspecified chronic kidney disease: Secondary | ICD-10-CM | POA: Diagnosis not present

## 2017-10-14 DIAGNOSIS — M21372 Foot drop, left foot: Secondary | ICD-10-CM | POA: Diagnosis not present

## 2017-10-14 DIAGNOSIS — I2510 Atherosclerotic heart disease of native coronary artery without angina pectoris: Secondary | ICD-10-CM | POA: Diagnosis not present

## 2017-10-14 DIAGNOSIS — S62306D Unspecified fracture of fifth metacarpal bone, right hand, subsequent encounter for fracture with routine healing: Secondary | ICD-10-CM | POA: Diagnosis not present

## 2017-10-14 DIAGNOSIS — E1122 Type 2 diabetes mellitus with diabetic chronic kidney disease: Secondary | ICD-10-CM | POA: Diagnosis not present

## 2017-10-14 NOTE — Telephone Encounter (Signed)
New Message:      Pt is calling back in regards to his pre-op clearance.

## 2017-10-14 NOTE — Telephone Encounter (Signed)
I have called the patient and informed him he has been cleared since yesterday. It looks like [name], our preop APP from yesterday has already electronically faxed the clearance letter to Dr. Nona Dell office.

## 2017-10-20 NOTE — Pre-Procedure Instructions (Signed)
[name]  10/20/2017      [facility] [place], [place] [address] [place] [address] Phone: [phone] Fax: [phone]  [facility] - [place], [place] - [address] AT [place] [address] [place] [address] Phone: [phone] Fax: [phone]  [facility] - [place], [address] AT [facility] [address] [place] [address] Phone: [phone] Fax: [phone]    Your procedure is scheduled on Mon. June 3  Report to [facility] Admitting at 5:30 A.M.  Call this number if you have problems the morning of surgery:  [phone]   Remember:   No food or liquids after midnight.                          Take these medicines the morning of surgery with A SIP OF WATER : zyrtec if needed, eye drops, gabapentin (neurontin), hydrocodone if needed, levothyroxine (synthroid), metoprolol (lopressor), nitroglycerine if needed, vicks sinus spray or sodium chloride nasal spray if needed              7 days prior to surgery STOP taking Aleve, Naproxen, Ibuprofen, Motrin, Advil, Goody's, BC's, all herbal medications, fish oil, and all vitamins                       How to Manage Your Diabetes Before and After Surgery  Why is it important to control my blood sugar before and after surgery? . Improving blood sugar levels before and after surgery helps healing and can limit problems. . A way of improving blood sugar control is eating a healthy diet by: o  Eating less sugar and carbohydrates o  Increasing activity/exercise o  Talking with your doctor about reaching your blood sugar goals . High blood sugars (greater than 180 mg/dL) can raise your risk of infections and slow your recovery, so you will need to focus on controlling your diabetes during the weeks before surgery. . Make sure that the doctor who takes care of your  diabetes knows about your planned surgery including the date and location.  How do I manage my blood sugar before surgery? . Check your blood sugar at least 4 times a day, starting 2 days before surgery, to make sure that the level is not too high or low. o Check your blood sugar the morning of your surgery when you wake up and every 2 hours until you get to the Short Stay unit. . If your blood sugar is less than 70 mg/dL, you will need to treat for low blood sugar: o Do not take insulin. o Treat a low blood sugar (less than 70 mg/dL) with  cup of clear juice (cranberry or apple), 4 glucose tablets, OR glucose gel. Recheck blood sugar in 15 minutes after treatment (to make sure it is greater than 70 mg/dL). If your blood sugar is not greater than 70 mg/dL on recheck, call [phone] o  for further instructions. . Report your blood sugar to the short stay nurse when you get to Short Stay.  . If you are admitted to the hospital after surgery: o Your blood sugar will be checked by the staff and you will probably be given insulin after surgery (instead of oral diabetes medicines) to make sure you have good blood sugar levels. o The  goal for blood sugar control after surgery is 80-180      WHAT DO I DO ABOUT MY DIABETES MEDICATION?   Marland Kitchen Do not take oral diabetes medicines (pills) the morning of surgery.    Do not wear jewelry.  Do not wear lotions, powders, or perfumes, or deodorant.  Do not shave 48 hours prior to surgery.  Men may shave face and neck.  Do not bring valuables to the hospital.  [facility] is not responsible for any belongings or valuables.  Contacts, dentures or bridgework may not be worn into surgery.  Leave your suitcase in the car.  After surgery it may be brought to your room.  For patients admitted to the hospital, discharge time will be determined by your treatment team.    Special instructions:  [facility]- Preparing For Surgery  Before surgery, you can  play an important role. Because skin is not sterile, your skin needs to be as free of germs as possible. You can reduce the number of germs on your skin by washing with CHG (chlorahexidine gluconate) Soap before surgery.  CHG is an antiseptic cleaner which kills germs and bonds with the skin to continue killing germs even after washing.    Oral Hygiene is also important to reduce your risk of infection.  Remember - BRUSH YOUR TEETH THE MORNING OF SURGERY WITH YOUR REGULAR TOOTHPASTE  Please do not use if you have an allergy to CHG or antibacterial soaps. If your skin becomes reddened/irritated stop using the CHG.  Do not shave (including legs and underarms) for at least 48 hours prior to first CHG shower. It is OK to shave your face.  Please follow these instructions carefully.   1. Shower the NIGHT BEFORE SURGERY and the MORNING OF SURGERY with CHG.   2. If you chose to wash your hair, wash your hair first as usual with your normal shampoo.  3. After you shampoo, rinse your hair and body thoroughly to remove the shampoo.  4. Use CHG as you would any other liquid soap. You can apply CHG directly to the skin and wash gently with a scrungie or a clean washcloth.   5. Apply the CHG Soap to your body ONLY FROM THE NECK DOWN.  Do not use on open wounds or open sores. Avoid contact with your eyes, ears, mouth and genitals (private parts). Wash Face and genitals (private parts)  with your normal soap.  6. Wash thoroughly, paying special attention to the area where your surgery will be performed.  7. Thoroughly rinse your body with warm water from the neck down.  8. DO NOT shower/wash with your normal soap after using and rinsing off the CHG Soap.  9. Pat yourself dry with a CLEAN TOWEL.  10. Wear CLEAN PAJAMAS to bed the night before surgery, wear comfortable clothes the morning of surgery  11. Place CLEAN SHEETS on your bed the night of your first shower and DO NOT SLEEP WITH PETS.    Day  of Surgery:  Do not apply any deodorants/lotions.  Please wear clean clothes to the hospital/surgery center.   Remember to brush your teeth WITH YOUR REGULAR TOOTHPASTE.    Please read over the following fact sheets that you were given. Coughing and Deep Breathing, MRSA Information and Surgical Site Infection Prevention

## 2017-10-21 ENCOUNTER — Other Ambulatory Visit: Payer: Self-pay

## 2017-10-21 ENCOUNTER — Telehealth: Payer: Self-pay | Admitting: Vascular Surgery

## 2017-10-21 ENCOUNTER — Encounter (HOSPITAL_COMMUNITY): Payer: Self-pay

## 2017-10-21 ENCOUNTER — Encounter (HOSPITAL_COMMUNITY)
Admission: RE | Admit: 2017-10-21 | Discharge: 2017-10-21 | Disposition: A | Discharge status: Home / Self Care | Payer: Medicare HMO | Source: Ambulatory Visit | Attending: Vascular Surgery | Admitting: Vascular Surgery

## 2017-10-21 DIAGNOSIS — I129 Hypertensive chronic kidney disease with stage 1 through stage 4 chronic kidney disease, or unspecified chronic kidney disease: Secondary | ICD-10-CM | POA: Diagnosis not present

## 2017-10-21 DIAGNOSIS — E1122 Type 2 diabetes mellitus with diabetic chronic kidney disease: Secondary | ICD-10-CM | POA: Diagnosis not present

## 2017-10-21 DIAGNOSIS — N189 Chronic kidney disease, unspecified: Secondary | ICD-10-CM | POA: Insufficient documentation

## 2017-10-21 DIAGNOSIS — I2510 Atherosclerotic heart disease of native coronary artery without angina pectoris: Secondary | ICD-10-CM | POA: Insufficient documentation

## 2017-10-21 DIAGNOSIS — Z79890 Hormone replacement therapy (postmenopausal): Secondary | ICD-10-CM | POA: Diagnosis not present

## 2017-10-21 DIAGNOSIS — I70212 Atherosclerosis of native arteries of extremities with intermittent claudication, left leg: Secondary | ICD-10-CM | POA: Diagnosis not present

## 2017-10-21 DIAGNOSIS — Z79899 Other long term (current) drug therapy: Secondary | ICD-10-CM | POA: Insufficient documentation

## 2017-10-21 DIAGNOSIS — Z01812 Encounter for preprocedural laboratory examination: Secondary | ICD-10-CM | POA: Diagnosis not present

## 2017-10-21 DIAGNOSIS — Z7984 Long term (current) use of oral hypoglycemic drugs: Secondary | ICD-10-CM | POA: Insufficient documentation

## 2017-10-21 DIAGNOSIS — Z7982 Long term (current) use of aspirin: Secondary | ICD-10-CM | POA: Insufficient documentation

## 2017-10-21 HISTORY — DX: Adverse effect of unspecified anesthetic, initial encounter: T41.45XA

## 2017-10-21 HISTORY — DX: Foot drop, left foot: M21.372

## 2017-10-21 HISTORY — DX: Other complications of anesthesia, initial encounter: T88.59XA

## 2017-10-21 HISTORY — DX: Dyspnea, unspecified: R06.00

## 2017-10-21 HISTORY — DX: Hypothyroidism, unspecified: E03.9

## 2017-10-21 LAB — BLOOD GAS, ARTERIAL
Acid-Base Excess: 0.2 mmol/L (ref 0.0–2.0)
Bicarbonate: 23.7 mmol/L (ref 20.0–28.0)
DRAWN BY: 470591
FIO2: 21
O2 Saturation: 95.3 %
PATIENT TEMPERATURE: 98.6
PH ART: 7.455 — AB (ref 7.350–7.450)
pCO2 arterial: 34.2 mmHg (ref 32.0–48.0)
pO2, Arterial: 76.3 mmHg — ABNORMAL LOW (ref 83.0–108.0)

## 2017-10-21 LAB — HEMOGLOBIN A1C
HEMOGLOBIN A1C: 6.2 % — AB (ref 4.8–5.6)
Mean Plasma Glucose: 131.24 mg/dL

## 2017-10-21 LAB — APTT: aPTT: 31 seconds (ref 24–36)

## 2017-10-21 LAB — URINALYSIS, ROUTINE W REFLEX MICROSCOPIC
BILIRUBIN URINE: NEGATIVE
Glucose, UA: NEGATIVE mg/dL
Hgb urine dipstick: NEGATIVE
Ketones, ur: NEGATIVE mg/dL
Leukocytes, UA: NEGATIVE
Nitrite: NEGATIVE
PH: 5 (ref 5.0–8.0)
Protein, ur: NEGATIVE mg/dL
SPECIFIC GRAVITY, URINE: 1.011 (ref 1.005–1.030)

## 2017-10-21 LAB — COMPREHENSIVE METABOLIC PANEL
ALBUMIN: 3.3 g/dL — AB (ref 3.5–5.0)
ALT: 20 U/L (ref 17–63)
AST: 37 U/L (ref 15–41)
Alkaline Phosphatase: 31 U/L — ABNORMAL LOW (ref 38–126)
Anion gap: 11 (ref 5–15)
BILIRUBIN TOTAL: 0.9 mg/dL (ref 0.3–1.2)
BUN: 43 mg/dL — ABNORMAL HIGH (ref 6–20)
CO2: 24 mmol/L (ref 22–32)
Calcium: 9.2 mg/dL (ref 8.9–10.3)
Chloride: 101 mmol/L (ref 101–111)
Creatinine, Ser: 2.16 mg/dL — ABNORMAL HIGH (ref 0.61–1.24)
GFR calc Af Amer: 32 mL/min — ABNORMAL LOW (ref >60)
GFR calc non Af Amer: 28 mL/min — ABNORMAL LOW (ref >60)
GLUCOSE: 121 mg/dL — AB (ref 65–99)
POTASSIUM: 4.4 mmol/L (ref 3.5–5.1)
SODIUM: 136 mmol/L (ref 135–145)
TOTAL PROTEIN: 6.4 g/dL — AB (ref 6.5–8.1)

## 2017-10-21 LAB — SURGICAL PCR SCREEN
MRSA, PCR: NEGATIVE
Staphylococcus aureus: POSITIVE — AB

## 2017-10-21 LAB — CBC
HEMATOCRIT: 35.5 % — AB (ref 39.0–52.0)
Hemoglobin: 11.4 g/dL — ABNORMAL LOW (ref 13.0–17.0)
MCH: 30.6 pg (ref 26.0–34.0)
MCHC: 32.1 g/dL (ref 30.0–36.0)
MCV: 95.2 fL (ref 78.0–100.0)
PLATELETS: 206 10*3/uL (ref 150–400)
RBC: 3.73 MIL/uL — AB (ref 4.22–5.81)
RDW: 15.9 % — ABNORMAL HIGH (ref 11.5–15.5)
WBC: 6.8 10*3/uL (ref 4.0–10.5)

## 2017-10-21 LAB — PROTIME-INR
INR: 1.13
Prothrombin Time: 14.4 seconds (ref 11.4–15.2)

## 2017-10-21 LAB — GLUCOSE, CAPILLARY: Glucose-Capillary: 90 mg/dL (ref 65–99)

## 2017-10-21 NOTE — Progress Notes (Signed)
   10/21/17 1158  OBSTRUCTIVE SLEEP APNEA  Have you ever been diagnosed with sleep apnea through a sleep study? No  Do you snore loudly (loud enough to be heard through closed doors)?  0  Do you often feel tired, fatigued, or sleepy during the daytime (such as falling asleep during driving or talking to someone)? 0  Has anyone observed you stop breathing during your sleep? 0  Do you have, or are you being treated for high blood pressure? 1  BMI more than 35 kg/m2? 1  Age > 50 (1-yes) 1  Neck circumference greater than:Male 16 inches or larger, Male 17inches or larger? 1  Male Gender (Yes=1) 1  Obstructive Sleep Apnea Score 5  Score 5 or greater  Results sent to PCP

## 2017-10-21 NOTE — Telephone Encounter (Signed)
Pt wife called and described bleeding from what sounds like a varicosity at the ankle left leg.  He has chronic left leg swelling  Discussed with her to keep legs elevated and stay off feet as much as possible until operation Monday.  She will call back if things worsen  Ruta Hinds, MD [facility] Office: [phone] Pager: [phone]

## 2017-10-21 NOTE — Progress Notes (Addendum)
PCP:Dr. Jola Baptist Cardiologist: Dr. Daneen Schick  Fasting sugars 100, pt. States he rarely checks,but would do so prior to surgery. Pt. Will not take aspirin day of surgery.

## 2017-10-22 MED ORDER — VANCOMYCIN HCL 10 G IV SOLR
1500.0000 mg | INTRAVENOUS | Status: AC
  Administered 2017-10-25: 1500 mg via INTRAVENOUS
  Filled 2017-10-22: qty 1500

## 2017-10-22 NOTE — Progress Notes (Signed)
Anesthesia Chart Review:   Case:  [id number] Date/Time:  10/25/17 0715   Procedure:  BYPASS GRAFT FEMORAL-BELOW KNEE POPLITEAL ARTERY (Left )   Anesthesia type:  General   Pre-op diagnosis:  claudication   [facility]  [facility] ROOM 11 / [facility] OR   Surgeon:  Elam Dutch, MD      DISCUSSION: - Pt is a 77 year old male with hx CAD (s/p CABG 09/09/11), CHF, carotid disease (s/p B CEA), PAD (s/p AFBG 2007), HTN, DM, CKD   VS: BP (!) 133/46   Pulse (!) 56   Temp (!) 36.4 C   Resp 20   Ht 5\' 11"  (1.803 m)   Wt 256 lb 9.6 oz (116.4 kg)   SpO2 95%   BMI 35.79 kg/m    PROVIDERS: - PCP is Lavone Orn, MD  - Cardiologist is Daneen Schick, MD. Last office visit 04/26/17. Pt cleared for surgery by Leanor Kail, PA on 10/13/17   LABS:  - Cr 2.16, BUN 43. Cr has ranged 1.5-2.1 in last few months in Epic.   - Pt received contrast for PV angiography 10/11/17. Dr. Oneida Alar has reviewed lab results. I routed CMP to PCP for f/u.   (all labs ordered are listed, but only abnormal results are displayed)  Labs Reviewed  SURGICAL PCR SCREEN - Abnormal; Notable for the following components:      Result Value   Staphylococcus aureus POSITIVE (*)    All other components within normal limits  BLOOD GAS, ARTERIAL - Abnormal; Notable for the following components:   pH, Arterial 7.455 (*)    pO2, Arterial 76.3 (*)    All other components within normal limits  CBC - Abnormal; Notable for the following components:   RBC 3.73 (*)    Hemoglobin 11.4 (*)    HCT 35.5 (*)    RDW 15.9 (*)    All other components within normal limits  COMPREHENSIVE METABOLIC PANEL - Abnormal; Notable for the following components:   Glucose, Bld 121 (*)    BUN 43 (*)    Creatinine, Ser 2.16 (*)    Total Protein 6.4 (*)    Albumin 3.3 (*)    Alkaline Phosphatase 31 (*)    GFR calc non Af Amer 28 (*)    GFR calc Af Amer 32 (*)    All other components within normal limits  HEMOGLOBIN A1C - Abnormal; Notable for the  following components:   Hgb A1c MFr Bld 6.2 (*)    All other components within normal limits  GLUCOSE, CAPILLARY  APTT  PROTIME-INR  URINALYSIS, ROUTINE W REFLEX MICROSCOPIC  TYPE AND SCREEN     IMAGES:  1 view CXR 10/11/17: Prior CABG. Congestive heart failure with pulmonary interstitial edema.   EKG 08/11/17: Sinus bradycardia (55 bpm). RBBB   CV:  Carotid duplex 02/18/17: Patent bilateral CEA sites with no bilateral ICA stenosis noted  Echo 04/11/15:  - Left ventricle: The cavity size was mildly dilated. There was mild concentric hypertrophy. Systolic function was normal. The estimated ejection fraction was in the range of 50% to 55%. Poor visualization of the endomyocardial border limits the accuracy of wall motion and LVEF assessment. Patient refuse contrast. Wall motion was normal; there were no regional wall motion abnormalities. Doppler parameters are consistent with abnormal left ventricular relaxation (grade 1 diastolic dysfunction). - Left atrium: The atrium was severely dilated. - Right ventricle: The cavity size was normal. Wall thickness was normal. Systolic function was normal.  Past Medical History:  Diagnosis Date  . Arthritis   . Atherosclerotic heart disease   . Back pain   . Bruises easily    takes Pletal daily and ASA 325mg  daily  . Cataract immature    right  . Chest pain   . Colon polyps   . Complication of anesthesia    hard to wake up with bypass surgery  . Cyst    on perineum;takes Doxycycline daily  . Diabetes mellitus    takes Glipizide,Metformin,and Actos daily  . Diarrhea   . Dyspnea    when lying flat  . Edema    right leg knee down swollen since fall 3 wks ago  . Foot drop, left   . GERD (gastroesophageal reflux disease)    Rolaids as needed-occ reflux  . Hemorrhoids   . History of kidney stones    at age 26   . Hyperlipidemia    takes Tricor and Lipitor daily  . Hyperlipidemia   . Hypertension    takes Altace,Amlodipine,and  Nadolol daily  . Hypothyroidism   . Kidney stone    35+yrs ago  . Muscle pain   . Nasal polyps    hx of  . Peripheral edema    wears knee length hose-told by podiatrist to wear these  . Peripheral neuropathy   . Peripheral neuropathy    takes Gabapentin daily  . Pneumonia    as a child  . PVD (peripheral vascular disease) ([place])   . Renal insufficiency   . Seasonal allergies    takes Mucinex and Zyrtec prn  . Urinary frequency    urgency/takes Vesicare daily    Past Surgical History:  Procedure Laterality Date  . Aortobifemoral bypass    . CARDIAC CATHETERIZATION  most recent 05/2011   total of 4  . CAROTID ANGIOGRAM N/A 06/05/2011   Procedure: CAROTID ANGIOGRAM;  Surgeon: Elam Dutch, MD;  Location: [facility] CATH LAB;  Service: Cardiovascular;  Laterality: N/A;  . CAROTID ENDARTERECTOMY  04/08/2011   left CEA  . cataract surgery Bilateral    left  . COLONOSCOPY    . COLONOSCOPY WITH PROPOFOL N/A 08/22/2013   Procedure: COLONOSCOPY WITH PROPOFOL;  Surgeon: Garlan Fair, MD;  Location: [facility] ENDOSCOPY;  Service: Endoscopy;  Laterality: N/A;  . CORONARY ARTERY BYPASS GRAFT  09/09/2011   Procedure: CORONARY ARTERY BYPASS GRAFTING (CABG);  Surgeon: Gaye Pollack, MD;  Location: [facility];  Service: Open Heart Surgery;  Laterality: N/A;  Coronary artery bypass grafting  x three with Right saphenous vein harvested endoscopically and left internal mammary artery  . ENDARTERECTOMY  04/08/2011   Procedure: ENDARTERECTOMY CAROTID;  Surgeon: Elam Dutch, MD;  Location: [facility] OR;  Service: Vascular;  Laterality: Left;  with patch angioplasty  . ENDARTERECTOMY  09/09/2011   Procedure: ENDARTERECTOMY CAROTID;  Surgeon: Elam Dutch, MD;  Location: [facility] OR;  Service: Vascular;  Laterality: Right;  with patch angioplasty  . LEFT HEART CATHETERIZATION WITH CORONARY ANGIOGRAM N/A 07/09/2011   Procedure: LEFT HEART CATHETERIZATION WITH CORONARY ANGIOGRAM;  Surgeon: Sinclair Grooms, MD;  Location:  [facility] CATH LAB;  Service: Cardiovascular;  Laterality: N/A;  . LOWER EXTREMITY ANGIOGRAPHY N/A 10/11/2017   Procedure: LOWER EXTREMITY ANGIOGRAPHY;  Surgeon: Waynetta Sandy, MD;  Location: [facility] CV LAB;  Service: Cardiovascular;  Laterality: N/A;  . Reimplantation of inferior mesenteric artery    . Repair of infrarenal abdominal aortic aneurysm    . SHOULDER ARTHROSCOPY W/ ROTATOR CUFF  REPAIR     right and left-open procedures  . TONSILLECTOMY     at age 89  . TRIGGER FINGER RELEASE Right 06/28/2014   Procedure: RIGHT LONG TRIGGER RELEASE ;  Surgeon: Leanora Cover, MD;  Location: [facility];  Service: Orthopedics;  Laterality: Right;  . wisdom teeth extracted     as a teenager    MEDICATIONS: . AMBULATORY NON FORMULARY MEDICATION  . aspirin 325 MG EC tablet  . atorvastatin (LIPITOR) 20 MG tablet  . cetirizine (ZYRTEC ALLERGY) 10 MG tablet  . Cyanocobalamin (VITAMIN B 12 PO)  . erythromycin ophthalmic ointment  . fenofibrate (TRICOR) 145 MG tablet  . furosemide (LASIX) 40 MG tablet  . gabapentin (NEURONTIN) 300 MG capsule  . HYDROcodone-acetaminophen (NORCO/VICODIN) 5-325 MG tablet  . levothyroxine (SYNTHROID, LEVOTHROID) 50 MCG tablet  . loperamide (IMODIUM A-D) 2 MG tablet  . methylcellulose (ARTIFICIAL TEARS) 1 % ophthalmic solution  . metoprolol (LOPRESSOR) 50 MG tablet  . morphine (MSIR) 15 MG tablet  . nitroGLYCERIN (NITROSTAT) 0.4 MG SL tablet  . NON FORMULARY  . oxymetazoline (VICKS SINEX) 0.05 % nasal spray  . pioglitazone (ACTOS) 30 MG tablet  . potassium chloride SA (K-DUR,KLOR-CON) 20 MEQ tablet  . pseudoephedrine-guaifenesin (MUCINEX D) 60-600 MG per tablet  . ramipril (ALTACE) 10 MG capsule  . sodium chloride (OCEAN) 0.65 % SOLN nasal spray  . temazepam (RESTORIL) 15 MG capsule   No current facility-administered medications for this encounter.     If no changes, I anticipate pt can proceed with surgery as scheduled.   Willeen Cass,  FNP-BC [facility] Short Stay Surgical Center/Anesthesiology Phone: [phone] 10/22/2017 3:15 PM

## 2017-10-25 ENCOUNTER — Other Ambulatory Visit: Payer: Self-pay

## 2017-10-25 ENCOUNTER — Inpatient Hospital Stay (HOSPITAL_COMMUNITY): Payer: Medicare HMO | Admitting: Anesthesiology

## 2017-10-25 ENCOUNTER — Encounter (HOSPITAL_COMMUNITY): Payer: Self-pay | Admitting: *Deleted

## 2017-10-25 ENCOUNTER — Inpatient Hospital Stay (HOSPITAL_COMMUNITY): Payer: Medicare HMO | Admitting: Emergency Medicine

## 2017-10-25 ENCOUNTER — Encounter (HOSPITAL_COMMUNITY)
Admission: RE | Disposition: A | Discharge status: Skilled Nursing Facility | Payer: Self-pay | Source: Ambulatory Visit | Attending: Vascular Surgery

## 2017-10-25 ENCOUNTER — Inpatient Hospital Stay (HOSPITAL_COMMUNITY)
Admission: RE | Admit: 2017-10-25 | Discharge: 2017-11-02 | DRG: 252 | Disposition: A | Discharge status: Skilled Nursing Facility | Payer: Medicare HMO | Source: Ambulatory Visit | Attending: Vascular Surgery | Admitting: Vascular Surgery

## 2017-10-25 DIAGNOSIS — E43 Unspecified severe protein-calorie malnutrition: Secondary | ICD-10-CM | POA: Diagnosis present

## 2017-10-25 DIAGNOSIS — E1122 Type 2 diabetes mellitus with diabetic chronic kidney disease: Secondary | ICD-10-CM | POA: Diagnosis present

## 2017-10-25 DIAGNOSIS — E785 Hyperlipidemia, unspecified: Secondary | ICD-10-CM | POA: Diagnosis present

## 2017-10-25 DIAGNOSIS — J302 Other seasonal allergic rhinitis: Secondary | ICD-10-CM | POA: Diagnosis present

## 2017-10-25 DIAGNOSIS — I130 Hypertensive heart and chronic kidney disease with heart failure and stage 1 through stage 4 chronic kidney disease, or unspecified chronic kidney disease: Secondary | ICD-10-CM | POA: Diagnosis not present

## 2017-10-25 DIAGNOSIS — L97529 Non-pressure chronic ulcer of other part of left foot with unspecified severity: Secondary | ICD-10-CM | POA: Diagnosis present

## 2017-10-25 DIAGNOSIS — Z87891 Personal history of nicotine dependence: Secondary | ICD-10-CM

## 2017-10-25 DIAGNOSIS — I5032 Chronic diastolic (congestive) heart failure: Secondary | ICD-10-CM | POA: Diagnosis not present

## 2017-10-25 DIAGNOSIS — Z9842 Cataract extraction status, left eye: Secondary | ICD-10-CM

## 2017-10-25 DIAGNOSIS — I872 Venous insufficiency (chronic) (peripheral): Secondary | ICD-10-CM | POA: Diagnosis present

## 2017-10-25 DIAGNOSIS — E1152 Type 2 diabetes mellitus with diabetic peripheral angiopathy with gangrene: Secondary | ICD-10-CM | POA: Diagnosis not present

## 2017-10-25 DIAGNOSIS — N179 Acute kidney failure, unspecified: Secondary | ICD-10-CM | POA: Diagnosis not present

## 2017-10-25 DIAGNOSIS — Z951 Presence of aortocoronary bypass graft: Secondary | ICD-10-CM

## 2017-10-25 DIAGNOSIS — M2550 Pain in unspecified joint: Secondary | ICD-10-CM | POA: Diagnosis not present

## 2017-10-25 DIAGNOSIS — M628 Other specified disorders of muscle: Secondary | ICD-10-CM | POA: Diagnosis not present

## 2017-10-25 DIAGNOSIS — M79672 Pain in left foot: Secondary | ICD-10-CM | POA: Diagnosis present

## 2017-10-25 DIAGNOSIS — E039 Hypothyroidism, unspecified: Secondary | ICD-10-CM | POA: Diagnosis present

## 2017-10-25 DIAGNOSIS — Z79899 Other long term (current) drug therapy: Secondary | ICD-10-CM

## 2017-10-25 DIAGNOSIS — E1142 Type 2 diabetes mellitus with diabetic polyneuropathy: Secondary | ICD-10-CM | POA: Diagnosis present

## 2017-10-25 DIAGNOSIS — I2510 Atherosclerotic heart disease of native coronary artery without angina pectoris: Secondary | ICD-10-CM | POA: Diagnosis present

## 2017-10-25 DIAGNOSIS — M1990 Unspecified osteoarthritis, unspecified site: Secondary | ICD-10-CM | POA: Diagnosis present

## 2017-10-25 DIAGNOSIS — N183 Chronic kidney disease, stage 3 (moderate): Secondary | ICD-10-CM | POA: Diagnosis present

## 2017-10-25 DIAGNOSIS — Z880 Allergy status to penicillin: Secondary | ICD-10-CM

## 2017-10-25 DIAGNOSIS — E875 Hyperkalemia: Secondary | ICD-10-CM | POA: Diagnosis not present

## 2017-10-25 DIAGNOSIS — Z79890 Hormone replacement therapy (postmenopausal): Secondary | ICD-10-CM

## 2017-10-25 DIAGNOSIS — I70211 Atherosclerosis of native arteries of extremities with intermittent claudication, right leg: Secondary | ICD-10-CM | POA: Diagnosis present

## 2017-10-25 DIAGNOSIS — R41841 Cognitive communication deficit: Secondary | ICD-10-CM | POA: Diagnosis not present

## 2017-10-25 DIAGNOSIS — I70261 Atherosclerosis of native arteries of extremities with gangrene, right leg: Secondary | ICD-10-CM | POA: Diagnosis not present

## 2017-10-25 DIAGNOSIS — Z8249 Family history of ischemic heart disease and other diseases of the circulatory system: Secondary | ICD-10-CM

## 2017-10-25 DIAGNOSIS — D62 Acute posthemorrhagic anemia: Secondary | ICD-10-CM | POA: Diagnosis not present

## 2017-10-25 DIAGNOSIS — R609 Edema, unspecified: Secondary | ICD-10-CM | POA: Diagnosis not present

## 2017-10-25 DIAGNOSIS — M6389 Disorders of muscle in diseases classified elsewhere, multiple sites: Secondary | ICD-10-CM | POA: Diagnosis not present

## 2017-10-25 DIAGNOSIS — E871 Hypo-osmolality and hyponatremia: Secondary | ICD-10-CM | POA: Diagnosis not present

## 2017-10-25 DIAGNOSIS — M21372 Foot drop, left foot: Secondary | ICD-10-CM | POA: Diagnosis present

## 2017-10-25 DIAGNOSIS — Z87442 Personal history of urinary calculi: Secondary | ICD-10-CM

## 2017-10-25 DIAGNOSIS — Z7982 Long term (current) use of aspirin: Secondary | ICD-10-CM

## 2017-10-25 DIAGNOSIS — E7800 Pure hypercholesterolemia, unspecified: Secondary | ICD-10-CM | POA: Diagnosis present

## 2017-10-25 DIAGNOSIS — I96 Gangrene, not elsewhere classified: Secondary | ICD-10-CM | POA: Diagnosis not present

## 2017-10-25 DIAGNOSIS — I739 Peripheral vascular disease, unspecified: Secondary | ICD-10-CM | POA: Diagnosis not present

## 2017-10-25 DIAGNOSIS — R268 Other abnormalities of gait and mobility: Secondary | ICD-10-CM | POA: Diagnosis not present

## 2017-10-25 DIAGNOSIS — Z6836 Body mass index (BMI) 36.0-36.9, adult: Secondary | ICD-10-CM

## 2017-10-25 DIAGNOSIS — I70212 Atherosclerosis of native arteries of extremities with intermittent claudication, left leg: Secondary | ICD-10-CM | POA: Diagnosis not present

## 2017-10-25 DIAGNOSIS — Z7984 Long term (current) use of oral hypoglycemic drugs: Secondary | ICD-10-CM

## 2017-10-25 DIAGNOSIS — Z7401 Bed confinement status: Secondary | ICD-10-CM | POA: Diagnosis not present

## 2017-10-25 DIAGNOSIS — Z95828 Presence of other vascular implants and grafts: Secondary | ICD-10-CM | POA: Diagnosis not present

## 2017-10-25 DIAGNOSIS — Z8701 Personal history of pneumonia (recurrent): Secondary | ICD-10-CM

## 2017-10-25 HISTORY — PX: APPLICATION OF WOUND VAC: SHX5189

## 2017-10-25 HISTORY — PX: FEMORAL-POPLITEAL BYPASS GRAFT: SHX937

## 2017-10-25 LAB — GLUCOSE, CAPILLARY
Glucose-Capillary: 76 mg/dL (ref 65–99)
Glucose-Capillary: 141 mg/dL — ABNORMAL HIGH (ref 65–99)

## 2017-10-25 LAB — CREATININE, SERUM
Creatinine, Ser: 1.68 mg/dL — ABNORMAL HIGH (ref 0.61–1.24)
GFR calc Af Amer: 43 mL/min — ABNORMAL LOW (ref >60)
GFR calc non Af Amer: 37 mL/min — ABNORMAL LOW (ref >60)

## 2017-10-25 LAB — CBC
HEMATOCRIT: 24.5 % — AB (ref 39.0–52.0)
Hemoglobin: 7.7 g/dL — ABNORMAL LOW (ref 13.0–17.0)
MCH: 31.2 pg (ref 26.0–34.0)
MCHC: 31.4 g/dL (ref 30.0–36.0)
MCV: 99.2 fL (ref 78.0–100.0)
Platelets: 185 10*3/uL (ref 150–400)
RBC: 2.47 MIL/uL — AB (ref 4.22–5.81)
RDW: 17.4 % — ABNORMAL HIGH (ref 11.5–15.5)
WBC: 9.7 10*3/uL (ref 4.0–10.5)

## 2017-10-25 LAB — PREPARE RBC (CROSSMATCH)

## 2017-10-25 SURGERY — BYPASS GRAFT FEMORAL-POPLITEAL ARTERY
Anesthesia: General | Site: Groin | Laterality: Left

## 2017-10-25 MED ORDER — PROTAMINE SULFATE 10 MG/ML IV SOLN
INTRAVENOUS | Status: DC | PRN
  Administered 2017-10-25: 20 mg via INTRAVENOUS
  Administered 2017-10-25: 35 mg via INTRAVENOUS
  Administered 2017-10-25: 20 mg via INTRAVENOUS
  Administered 2017-10-25: 10 mg via INTRAVENOUS
  Administered 2017-10-25: 20 mg via INTRAVENOUS
  Administered 2017-10-25: 5 mg via INTRAVENOUS
  Administered 2017-10-25 (×2): 20 mg via INTRAVENOUS
  Administered 2017-10-25 (×2): 10 mg via INTRAVENOUS
  Administered 2017-10-25: 30 mg via INTRAVENOUS

## 2017-10-25 MED ORDER — GABAPENTIN 300 MG PO CAPS
600.0000 mg | ORAL_CAPSULE | Freq: Every day | ORAL | Status: DC
  Administered 2017-10-26 – 2017-10-27 (×2): 600 mg via ORAL
  Filled 2017-10-25 (×2): qty 2

## 2017-10-25 MED ORDER — MUPIROCIN 2 % EX OINT
1.0000 "application " | TOPICAL_OINTMENT | Freq: Two times a day (BID) | CUTANEOUS | Status: DC
  Filled 2017-10-25: qty 22

## 2017-10-25 MED ORDER — DEXAMETHASONE SODIUM PHOSPHATE 4 MG/ML IJ SOLN
INTRAMUSCULAR | Status: DC | PRN
  Administered 2017-10-25: 4 mg via INTRAVENOUS

## 2017-10-25 MED ORDER — ALUM & MAG HYDROXIDE-SIMETH 200-200-20 MG/5ML PO SUSP: 15.0000 mL | ORAL | Status: DC | PRN

## 2017-10-25 MED ORDER — METOPROLOL TARTRATE 5 MG/5ML IV SOLN: 2.0000 mg | INTRAVENOUS | Status: DC | PRN

## 2017-10-25 MED ORDER — ACETAMINOPHEN 325 MG PO TABS: 325.0000 mg | ORAL_TABLET | ORAL | Status: DC | PRN

## 2017-10-25 MED ORDER — ONDANSETRON HCL 4 MG/2ML IJ SOLN
INTRAMUSCULAR | Status: DC | PRN
  Administered 2017-10-25: 4 mg via INTRAVENOUS

## 2017-10-25 MED ORDER — GABAPENTIN 300 MG PO CAPS
300.0000 mg | ORAL_CAPSULE | Freq: Every day | ORAL | Status: DC
  Administered 2017-10-25 – 2017-11-01 (×8): 300 mg via ORAL
  Filled 2017-10-25 (×9): qty 1

## 2017-10-25 MED ORDER — PHENOL 1.4 % MT LIQD: 1.0000 | OROMUCOSAL | Status: DC | PRN

## 2017-10-25 MED ORDER — FUROSEMIDE 40 MG PO TABS
40.0000 mg | ORAL_TABLET | Freq: Every day | ORAL | Status: DC | PRN
  Administered 2017-10-26: 40 mg via ORAL
  Filled 2017-10-25: qty 1

## 2017-10-25 MED ORDER — POTASSIUM CHLORIDE CRYS ER 20 MEQ PO TBCR: 20.0000 meq | EXTENDED_RELEASE_TABLET | Freq: Every day | ORAL | Status: DC | PRN

## 2017-10-25 MED ORDER — PROTAMINE SULFATE 10 MG/ML IV SOLN
INTRAVENOUS | Status: AC
  Filled 2017-10-25: qty 5

## 2017-10-25 MED ORDER — FUROSEMIDE 10 MG/ML IJ SOLN
20.0000 mg | Freq: Once | INTRAMUSCULAR | Status: AC
  Administered 2017-10-25: 20 mg via INTRAVENOUS
  Filled 2017-10-25: qty 2

## 2017-10-25 MED ORDER — PROPOFOL 10 MG/ML IV BOLUS
INTRAVENOUS | Status: AC
  Filled 2017-10-25: qty 20

## 2017-10-25 MED ORDER — GABAPENTIN 300 MG PO CAPS: 300.0000 mg | ORAL_CAPSULE | ORAL | Status: DC

## 2017-10-25 MED ORDER — RAMIPRIL 10 MG PO CAPS
10.0000 mg | ORAL_CAPSULE | Freq: Every day | ORAL | Status: DC
  Administered 2017-10-25 – 2017-10-27 (×2): 10 mg via ORAL
  Filled 2017-10-25 (×3): qty 1

## 2017-10-25 MED ORDER — ATORVASTATIN CALCIUM 20 MG PO TABS
20.0000 mg | ORAL_TABLET | Freq: Every evening | ORAL | Status: DC
  Administered 2017-10-25 – 2017-11-01 (×7): 20 mg via ORAL
  Filled 2017-10-25 (×8): qty 1

## 2017-10-25 MED ORDER — SODIUM CHLORIDE 0.9 % IV SOLN: INTRAVENOUS | Status: DC

## 2017-10-25 MED ORDER — HYDROCODONE-ACETAMINOPHEN 5-325 MG PO TABS
1.0000 | ORAL_TABLET | ORAL | Status: DC | PRN
  Administered 2017-10-25: 1 via ORAL
  Administered 2017-10-26 (×2): 2 via ORAL
  Administered 2017-10-26: 1 via ORAL
  Administered 2017-10-26: 2 via ORAL
  Administered 2017-10-26 – 2017-10-28 (×4): 1 via ORAL
  Administered 2017-10-28 – 2017-10-29 (×4): 2 via ORAL
  Filled 2017-10-25: qty 1
  Filled 2017-10-25: qty 2
  Filled 2017-10-25: qty 1
  Filled 2017-10-25 (×2): qty 2
  Filled 2017-10-25 (×3): qty 1
  Filled 2017-10-25 (×4): qty 2
  Filled 2017-10-25: qty 1

## 2017-10-25 MED ORDER — HEPARIN SODIUM (PORCINE) 5000 UNIT/ML IJ SOLN
5000.0000 [IU] | Freq: Three times a day (TID) | INTRAMUSCULAR | Status: DC
  Administered 2017-10-25 – 2017-10-28 (×8): 5000 [IU] via SUBCUTANEOUS
  Filled 2017-10-25 (×8): qty 1

## 2017-10-25 MED ORDER — ROCURONIUM BROMIDE 100 MG/10ML IV SOLN
INTRAVENOUS | Status: DC | PRN
  Administered 2017-10-25: 10 mg via INTRAVENOUS
  Administered 2017-10-25: 20 mg via INTRAVENOUS
  Administered 2017-10-25: 50 mg via INTRAVENOUS

## 2017-10-25 MED ORDER — LEVOTHYROXINE SODIUM 50 MCG PO TABS
50.0000 ug | ORAL_TABLET | Freq: Every day | ORAL | Status: DC
  Administered 2017-10-26 – 2017-11-02 (×8): 50 ug via ORAL
  Filled 2017-10-25 (×8): qty 1

## 2017-10-25 MED ORDER — PHENYLEPHRINE 40 MCG/ML (10ML) SYRINGE FOR IV PUSH (FOR BLOOD PRESSURE SUPPORT)
PREFILLED_SYRINGE | INTRAVENOUS | Status: DC | PRN
  Administered 2017-10-25 (×5): 80 ug via INTRAVENOUS
  Administered 2017-10-25: 40 ug via INTRAVENOUS
  Administered 2017-10-25 (×4): 80 ug via INTRAVENOUS
  Administered 2017-10-25: 40 ug via INTRAVENOUS

## 2017-10-25 MED ORDER — CHLORHEXIDINE GLUCONATE CLOTH 2 % EX PADS: 6.0000 | MEDICATED_PAD | Freq: Once | CUTANEOUS | Status: DC

## 2017-10-25 MED ORDER — ONDANSETRON HCL 4 MG/2ML IJ SOLN: 4.0000 mg | Freq: Once | INTRAMUSCULAR | Status: DC | PRN

## 2017-10-25 MED ORDER — SODIUM CHLORIDE 0.9 % IV SOLN
INTRAVENOUS | Status: AC
  Filled 2017-10-25: qty 1.2

## 2017-10-25 MED ORDER — HYDRALAZINE HCL 20 MG/ML IJ SOLN
5.0000 mg | INTRAMUSCULAR | Status: AC | PRN
Start: 2017-10-25 — End: 2017-10-31
  Administered 2017-10-25 – 2017-10-31 (×2): 5 mg via INTRAVENOUS
  Filled 2017-10-25 (×2): qty 1

## 2017-10-25 MED ORDER — PHENYLEPHRINE HCL 10 MG/ML IJ SOLN
INTRAVENOUS | Status: DC | PRN
  Administered 2017-10-25: 40 ug/min via INTRAVENOUS

## 2017-10-25 MED ORDER — DOCUSATE SODIUM 100 MG PO CAPS
100.0000 mg | ORAL_CAPSULE | Freq: Every day | ORAL | Status: DC
  Administered 2017-10-26 – 2017-11-01 (×7): 100 mg via ORAL
  Filled 2017-10-25 (×7): qty 1

## 2017-10-25 MED ORDER — HEMOSTATIC AGENTS (NO CHARGE) OPTIME
TOPICAL | Status: DC | PRN
  Administered 2017-10-25: 5 via TOPICAL

## 2017-10-25 MED ORDER — SUGAMMADEX SODIUM 500 MG/5ML IV SOLN
INTRAVENOUS | Status: AC
  Filled 2017-10-25: qty 5

## 2017-10-25 MED ORDER — PANTOPRAZOLE SODIUM 40 MG PO TBEC
40.0000 mg | DELAYED_RELEASE_TABLET | Freq: Every day | ORAL | Status: DC
  Administered 2017-10-25 – 2017-11-02 (×9): 40 mg via ORAL
  Filled 2017-10-25 (×9): qty 1

## 2017-10-25 MED ORDER — GUAIFENESIN-DM 100-10 MG/5ML PO SYRP: 15.0000 mL | ORAL_SOLUTION | ORAL | Status: DC | PRN

## 2017-10-25 MED ORDER — PHENYLEPHRINE 40 MCG/ML (10ML) SYRINGE FOR IV PUSH (FOR BLOOD PRESSURE SUPPORT)
PREFILLED_SYRINGE | INTRAVENOUS | Status: AC
  Filled 2017-10-25: qty 10

## 2017-10-25 MED ORDER — METOPROLOL TARTRATE 50 MG PO TABS
50.0000 mg | ORAL_TABLET | Freq: Two times a day (BID) | ORAL | Status: DC
  Administered 2017-10-26 – 2017-11-02 (×14): 50 mg via ORAL
  Filled 2017-10-25 (×15): qty 1

## 2017-10-25 MED ORDER — MAGNESIUM SULFATE 2 GM/50ML IV SOLN: 2.0000 g | Freq: Every day | INTRAVENOUS | Status: DC | PRN

## 2017-10-25 MED ORDER — EPHEDRINE SULFATE 50 MG/ML IJ SOLN
INTRAMUSCULAR | Status: AC
  Filled 2017-10-25: qty 1

## 2017-10-25 MED ORDER — GLYCOPYRROLATE PF 0.2 MG/ML IJ SOSY
PREFILLED_SYRINGE | INTRAMUSCULAR | Status: DC | PRN
  Administered 2017-10-25: .1 mg via INTRAVENOUS

## 2017-10-25 MED ORDER — DEXAMETHASONE SODIUM PHOSPHATE 10 MG/ML IJ SOLN
INTRAMUSCULAR | Status: AC
  Filled 2017-10-25: qty 1

## 2017-10-25 MED ORDER — HYDROMORPHONE HCL 1 MG/ML IJ SOLN
1.0000 mg | INTRAMUSCULAR | Status: DC | PRN
  Administered 2017-10-25 – 2017-10-26 (×2): 1 mg via INTRAVENOUS
  Filled 2017-10-25 (×2): qty 1

## 2017-10-25 MED ORDER — ONDANSETRON HCL 4 MG/2ML IJ SOLN
INTRAMUSCULAR | Status: AC
  Filled 2017-10-25: qty 2

## 2017-10-25 MED ORDER — HEPARIN SODIUM (PORCINE) 1000 UNIT/ML IJ SOLN
INTRAMUSCULAR | Status: AC
  Filled 2017-10-25: qty 1

## 2017-10-25 MED ORDER — FENTANYL CITRATE (PF) 100 MCG/2ML IJ SOLN: 25.0000 ug | INTRAMUSCULAR | Status: DC | PRN

## 2017-10-25 MED ORDER — ROCURONIUM BROMIDE 10 MG/ML (PF) SYRINGE
PREFILLED_SYRINGE | INTRAVENOUS | Status: AC
  Filled 2017-10-25: qty 5

## 2017-10-25 MED ORDER — ALBUMIN HUMAN 5 % IV SOLN
INTRAVENOUS | Status: DC | PRN
  Administered 2017-10-25 (×3): via INTRAVENOUS

## 2017-10-25 MED ORDER — SODIUM CHLORIDE 0.9 % IV SOLN
500.0000 mL | Freq: Once | INTRAVENOUS | Status: AC | PRN
  Administered 2017-10-25: 500 mL via INTRAVENOUS

## 2017-10-25 MED ORDER — EPHEDRINE SULFATE-NACL 50-0.9 MG/10ML-% IV SOSY
PREFILLED_SYRINGE | INTRAVENOUS | Status: DC | PRN
  Administered 2017-10-25 (×2): 5 mg via INTRAVENOUS
  Administered 2017-10-25 (×2): 10 mg via INTRAVENOUS
  Administered 2017-10-25: 5 mg via INTRAVENOUS

## 2017-10-25 MED ORDER — FENOFIBRATE 54 MG PO TABS
54.0000 mg | ORAL_TABLET | Freq: Every day | ORAL | Status: DC
  Administered 2017-10-25 – 2017-11-02 (×9): 54 mg via ORAL
  Filled 2017-10-25 (×9): qty 1

## 2017-10-25 MED ORDER — ONDANSETRON HCL 4 MG/2ML IJ SOLN: 4.0000 mg | Freq: Four times a day (QID) | INTRAMUSCULAR | Status: DC | PRN

## 2017-10-25 MED ORDER — FENTANYL CITRATE (PF) 250 MCG/5ML IJ SOLN
INTRAMUSCULAR | Status: AC
  Filled 2017-10-25: qty 5

## 2017-10-25 MED ORDER — FENTANYL CITRATE (PF) 100 MCG/2ML IJ SOLN
INTRAMUSCULAR | Status: DC | PRN
  Administered 2017-10-25 (×2): 50 ug via INTRAVENOUS
  Administered 2017-10-25 (×2): 25 ug via INTRAVENOUS
  Administered 2017-10-25: 50 ug via INTRAVENOUS
  Administered 2017-10-25: 100 ug via INTRAVENOUS
  Administered 2017-10-25 (×4): 50 ug via INTRAVENOUS
  Administered 2017-10-25: 25 ug via INTRAVENOUS

## 2017-10-25 MED ORDER — TEMAZEPAM 7.5 MG PO CAPS
15.0000 mg | ORAL_CAPSULE | Freq: Every evening | ORAL | Status: DC | PRN
  Administered 2017-10-29 – 2017-10-31 (×2): 15 mg via ORAL
  Filled 2017-10-25 (×3): qty 2

## 2017-10-25 MED ORDER — DEXTROSE 50 % IV SOLN
INTRAVENOUS | Status: AC
  Filled 2017-10-25: qty 50

## 2017-10-25 MED ORDER — PROPOFOL 10 MG/ML IV BOLUS
INTRAVENOUS | Status: DC | PRN
  Administered 2017-10-25: 140 mg via INTRAVENOUS

## 2017-10-25 MED ORDER — 0.9 % SODIUM CHLORIDE (POUR BTL) OPTIME
TOPICAL | Status: DC | PRN
  Administered 2017-10-25: 2000 mL

## 2017-10-25 MED ORDER — SODIUM CHLORIDE 0.9 % IV SOLN
Freq: Once | INTRAVENOUS | Status: AC
  Administered 2017-10-28: 15:00:00 via INTRAVENOUS

## 2017-10-25 MED ORDER — HEPARIN SODIUM (PORCINE) 5000 UNIT/ML IJ SOLN
INTRAMUSCULAR | Status: DC | PRN
  Administered 2017-10-25: 500 mL

## 2017-10-25 MED ORDER — PIOGLITAZONE HCL 30 MG PO TABS
30.0000 mg | ORAL_TABLET | Freq: Every day | ORAL | Status: DC
  Administered 2017-10-26 – 2017-11-02 (×8): 30 mg via ORAL
  Filled 2017-10-25 (×8): qty 1

## 2017-10-25 MED ORDER — PHENYLEPHRINE 40 MCG/ML (10ML) SYRINGE FOR IV PUSH (FOR BLOOD PRESSURE SUPPORT)
PREFILLED_SYRINGE | INTRAVENOUS | Status: AC
  Filled 2017-10-25: qty 20

## 2017-10-25 MED ORDER — SUGAMMADEX SODIUM 200 MG/2ML IV SOLN
INTRAVENOUS | Status: DC | PRN
  Administered 2017-10-25: 400 mg via INTRAVENOUS

## 2017-10-25 MED ORDER — LIDOCAINE 2% (20 MG/ML) 5 ML SYRINGE
INTRAMUSCULAR | Status: AC
  Filled 2017-10-25: qty 5

## 2017-10-25 MED ORDER — ACETAMINOPHEN 325 MG RE SUPP: 325.0000 mg | RECTAL | Status: DC | PRN

## 2017-10-25 MED ORDER — DEXTROSE 50 % IV SOLN
INTRAVENOUS | Status: DC | PRN
  Administered 2017-10-25: 12.5 g via INTRAVENOUS

## 2017-10-25 MED ORDER — LABETALOL HCL 5 MG/ML IV SOLN: 10.0000 mg | INTRAVENOUS | Status: DC | PRN

## 2017-10-25 MED ORDER — FENTANYL CITRATE (PF) 250 MCG/5ML IJ SOLN
INTRAMUSCULAR | Status: AC
Start: 2017-10-25 — End: ?
  Filled 2017-10-25: qty 5

## 2017-10-25 MED ORDER — ASPIRIN EC 325 MG PO TBEC
325.0000 mg | DELAYED_RELEASE_TABLET | Freq: Every evening | ORAL | Status: DC
  Administered 2017-10-25 – 2017-11-01 (×6): 325 mg via ORAL
  Filled 2017-10-25 (×7): qty 1

## 2017-10-25 MED ORDER — HEPARIN SODIUM (PORCINE) 1000 UNIT/ML IJ SOLN
INTRAMUSCULAR | Status: DC | PRN
  Administered 2017-10-25: 10000 [IU] via INTRAVENOUS
  Administered 2017-10-25 (×2): 5000 [IU] via INTRAVENOUS

## 2017-10-25 MED ORDER — CLINDAMYCIN PHOSPHATE 300 MG/50ML IV SOLN
300.0000 mg | Freq: Three times a day (TID) | INTRAVENOUS | Status: AC
  Administered 2017-10-25 – 2017-10-26 (×2): 300 mg via INTRAVENOUS
  Filled 2017-10-25 (×3): qty 50

## 2017-10-25 MED ORDER — LACTATED RINGERS IV SOLN
INTRAVENOUS | Status: DC | PRN
  Administered 2017-10-25 (×2): via INTRAVENOUS

## 2017-10-25 SURGICAL SUPPLY — 81 items
ADH SKN CLS APL DERMABOND .7 (GAUZE/BANDAGES/DRESSINGS)
AGENT HMST SPONGE THK3/8 (HEMOSTASIS) ×5
BANDAGE ESMARK 6X9 LF (GAUZE/BANDAGES/DRESSINGS) IMPLANT
BNDG CMPR 9X6 STRL LF SNTH (GAUZE/BANDAGES/DRESSINGS)
BNDG ESMARK 6X9 LF (GAUZE/BANDAGES/DRESSINGS)
CANISTER SUCT 3000ML PPV (MISCELLANEOUS) ×2 IMPLANT
CANNULA VESSEL 3MM 2 BLNT TIP (CANNULA) ×4 IMPLANT
CATH EMB 4FR 80CM (CATHETERS) ×1 IMPLANT
CATH EMB 5FR 80CM (CATHETERS) ×1 IMPLANT
CLIP VESOCCLUDE MED 24/CT (CLIP) ×3 IMPLANT
CLIP VESOCCLUDE SM WIDE 24/CT (CLIP) ×2 IMPLANT
COVER PROBE W GEL 5X96 (DRAPES) ×1 IMPLANT
CUFF TOURNIQUET SINGLE 24IN (TOURNIQUET CUFF) IMPLANT
CUFF TOURNIQUET SINGLE 34IN LL (TOURNIQUET CUFF) IMPLANT
CUFF TOURNIQUET SINGLE 44IN (TOURNIQUET CUFF) IMPLANT
DERMABOND ADVANCED (GAUZE/BANDAGES/DRESSINGS)
DERMABOND ADVANCED .7 DNX12 (GAUZE/BANDAGES/DRESSINGS) IMPLANT
DRAIN SNY WOU (WOUND CARE) IMPLANT
DRAPE HALF SHEET 40X57 (DRAPES) IMPLANT
DRAPE X-RAY CASS 24X20 (DRAPES) IMPLANT
DRESSING PEEL AND PLAC PRVNA20 (GAUZE/BANDAGES/DRESSINGS) IMPLANT
DRESSING PEEL AND PLC PRVNA 13 (GAUZE/BANDAGES/DRESSINGS) IMPLANT
DRSG COVADERM 4X10 (GAUZE/BANDAGES/DRESSINGS) ×1 IMPLANT
DRSG COVADERM 4X14 (GAUZE/BANDAGES/DRESSINGS) ×1 IMPLANT
DRSG COVADERM 4X6 (GAUZE/BANDAGES/DRESSINGS) ×1 IMPLANT
DRSG COVADERM 4X8 (GAUZE/BANDAGES/DRESSINGS) ×1 IMPLANT
DRSG PEEL AND PLACE PREVENA 13 (GAUZE/BANDAGES/DRESSINGS) ×2
DRSG PEEL AND PLACE PREVENA 20 (GAUZE/BANDAGES/DRESSINGS) ×2
ELECT CAUTERY BLADE 6.4 (BLADE) ×1 IMPLANT
ELECT REM PT RETURN 9FT ADLT (ELECTROSURGICAL) ×4
ELECTRODE REM PT RTRN 9FT ADLT (ELECTROSURGICAL) ×1 IMPLANT
EVACUATOR SILICONE 100CC (DRAIN) IMPLANT
GAUZE SPONGE 4X4 16PLY XRAY LF (GAUZE/BANDAGES/DRESSINGS) ×1 IMPLANT
GLOVE BIO SURGEON STRL SZ 6.5 (GLOVE) ×3 IMPLANT
GLOVE BIO SURGEON STRL SZ7.5 (GLOVE) ×6 IMPLANT
GLOVE BIOGEL PI IND STRL 6.5 (GLOVE) IMPLANT
GLOVE BIOGEL PI IND STRL 7.0 (GLOVE) IMPLANT
GLOVE BIOGEL PI IND STRL 8 (GLOVE) IMPLANT
GLOVE BIOGEL PI IND STRL 8.5 (GLOVE) IMPLANT
GLOVE BIOGEL PI INDICATOR 6.5 (GLOVE) ×2
GLOVE BIOGEL PI INDICATOR 7.0 (GLOVE) ×1
GLOVE BIOGEL PI INDICATOR 8 (GLOVE) ×2
GLOVE BIOGEL PI INDICATOR 8.5 (GLOVE) ×1
GLOVE SURG SS PI 6.5 STRL IVOR (GLOVE) ×1 IMPLANT
GLOVE SURG SS PI 7.0 STRL IVOR (GLOVE) ×2 IMPLANT
GOWN STRL NON-REIN LRG LVL3 (GOWN DISPOSABLE) ×1 IMPLANT
GOWN STRL REUS W/ TWL LRG LVL3 (GOWN DISPOSABLE) ×3 IMPLANT
GOWN STRL REUS W/TWL LRG LVL3 (GOWN DISPOSABLE) ×16
HEMOSTAT SPONGE AVITENE ULTRA (HEMOSTASIS) ×5 IMPLANT
KIT BASIN OR (CUSTOM PROCEDURE TRAY) ×2 IMPLANT
KIT DRSG PREVENA PLUS 7DAY 125 (MISCELLANEOUS) ×1 IMPLANT
KIT TURNOVER KIT B (KITS) ×2 IMPLANT
LOOP VESSEL MAXI BLUE (MISCELLANEOUS) ×3 IMPLANT
NS IRRIG 1000ML POUR BTL (IV SOLUTION) ×4 IMPLANT
PACK PERIPHERAL VASCULAR (CUSTOM PROCEDURE TRAY) ×2 IMPLANT
PAD ARMBOARD 7.5X6 YLW CONV (MISCELLANEOUS) ×4 IMPLANT
PENCIL BUTTON HOLSTER BLD 10FT (ELECTRODE) ×1 IMPLANT
SET COLLECT BLD 21X3/4 12 (NEEDLE) IMPLANT
SPONGE LAP 18X18 RF (DISPOSABLE) ×2 IMPLANT
SPONGE LAP 18X18 X RAY DECT (DISPOSABLE) ×4 IMPLANT
STAPLER VISISTAT 35W (STAPLE) ×3 IMPLANT
STOPCOCK 4 WAY LG BORE MALE ST (IV SETS) ×1 IMPLANT
SUT PROLENE 5 0 C 1 24 (SUTURE) ×14 IMPLANT
SUT PROLENE 6 0 CC (SUTURE) ×4 IMPLANT
SUT PROLENE 7 0 BV 1 (SUTURE) ×2 IMPLANT
SUT PROLENE 7 0 BV1 MDA (SUTURE) ×1 IMPLANT
SUT SILK 2 0 SH (SUTURE) ×2 IMPLANT
SUT SILK 3 0 (SUTURE) ×4
SUT SILK 3-0 18XBRD TIE 12 (SUTURE) IMPLANT
SUT VIC AB 2-0 SH 27 (SUTURE) ×12
SUT VIC AB 2-0 SH 27XBRD (SUTURE) ×2 IMPLANT
SUT VIC AB 3-0 SH 27 (SUTURE) ×24
SUT VIC AB 3-0 SH 27X BRD (SUTURE) ×4 IMPLANT
SUT VIC AB 4-0 PS2 27 (SUTURE) ×4 IMPLANT
SYR 3ML LL SCALE MARK (SYRINGE) ×3 IMPLANT
TAPE UMBILICAL COTTON 1/8X30 (MISCELLANEOUS) ×2 IMPLANT
TOWEL GREEN STERILE (TOWEL DISPOSABLE) ×2 IMPLANT
TRAY FOLEY MTR SLVR 16FR STAT (SET/KITS/TRAYS/PACK) ×2 IMPLANT
TUBING EXTENTION W/L.L. (IV SETS) IMPLANT
UNDERPAD 30X30 (UNDERPADS AND DIAPERS) ×2 IMPLANT
WATER STERILE IRR 1000ML POUR (IV SOLUTION) ×2 IMPLANT

## 2017-10-25 NOTE — Progress Notes (Signed)
Dr. Oneida Alar at bedside. Stated to MD that patient has been confused about where he is and the situation. MD wants an ICU bed if patient continues to be confused. Also asked about DBPs being low, 30s-40s, was ok with this as he was running low during surgery as well. Will continue to monitor patient.

## 2017-10-25 NOTE — Anesthesia Procedure Notes (Signed)
Arterial Line Insertion Start/End6/07/2017 7:55 AM, 10/25/2017 8:00 AM Performed by: Orlie Dakin, CRNA, CRNA  Patient location: OR. Preanesthetic checklist: patient identified, IV checked, site marked, risks and benefits discussed, surgical consent, monitors and equipment checked, pre-op evaluation and timeout performed Patient sedated Right, radial was placed Catheter size: 20 G Hand hygiene performed  and maximum sterile barriers used   Attempts: 1 Procedure performed without using ultrasound guided technique. Following insertion, dressing applied and Biopatch. Post procedure assessment: normal  Patient tolerated the procedure well with no immediate complications.

## 2017-10-25 NOTE — Anesthesia Preprocedure Evaluation (Signed)
Anesthesia Evaluation  Patient identified by MRN, date of birth, ID band Patient awake    Reviewed: Allergy & Precautions, NPO status , Patient's Chart, lab work & pertinent test results  Airway Mallampati: III  TM Distance: >3 FB Neck ROM: Full    Dental  (+) Teeth Intact, Dental Advisory Given   Pulmonary former smoker,    breath sounds clear to auscultation       Cardiovascular hypertension,  Rhythm:Regular Rate:Normal     Neuro/Psych    GI/Hepatic   Endo/Other  diabetes  Renal/GU      Musculoskeletal   Abdominal   Peds  Hematology   Anesthesia Other Findings   Reproductive/Obstetrics                             Anesthesia Physical Anesthesia Plan  ASA: III  Anesthesia Plan: General   Post-op Pain Management:    Induction: Intravenous  PONV Risk Score and Plan: Ondansetron and Dexamethasone  Airway Management Planned: Oral ETT  Additional Equipment: Arterial line  Intra-op Plan:   Post-operative Plan: Extubation in OR  Informed Consent: I have reviewed the patients History and Physical, chart, labs and discussed the procedure including the risks, benefits and alternatives for the proposed anesthesia with the patient or authorized representative who has indicated his/her understanding and acceptance.   Dental advisory given  Plan Discussed with: CRNA and Anesthesiologist  Anesthesia Plan Comments:         Anesthesia Quick Evaluation

## 2017-10-25 NOTE — Anesthesia Procedure Notes (Signed)
Procedure Name: Intubation Date/Time: 10/25/2017 7:44 AM Performed by: Orlie Dakin, CRNA Pre-anesthesia Checklist: Patient identified, Emergency Drugs available, Suction available, Patient being monitored and Timeout performed Patient Re-evaluated:Patient Re-evaluated prior to induction Oxygen Delivery Method: Circle system utilized Preoxygenation: Pre-oxygenation with 100% oxygen Induction Type: IV induction Ventilation: Mask ventilation without difficulty and Oral airway inserted - appropriate to patient size Laryngoscope Size: Sabra Heck and 3 Grade View: Grade I Tube type: Oral Tube size: 7.5 mm Number of attempts: 1 Airway Equipment and Method: Stylet Placement Confirmation: ETT inserted through vocal cords under direct vision,  positive ETCO2 and breath sounds checked- equal and bilateral Secured at: 22 cm Tube secured with: Tape Dental Injury: Teeth and Oropharynx as per pre-operative assessment  Comments: 4x4s bite block used.

## 2017-10-25 NOTE — Transfer of Care (Signed)
Immediate Anesthesia Transfer of Care Note  Patient: [name]  Procedure(s) Performed: Left Common Femoral Endarterectomy and perfundaplasty, Left BYPASS GRAFT FEMORAL-BELOW KNEE POPLITEAL ARTERY. (Left Groin) Wound vac placement to left groin. (Left Groin)  Patient Location: PACU  Anesthesia Type:General  Level of Consciousness: awake and patient cooperative  Airway & Oxygen Therapy: Patient Spontanous Breathing and Patient connected to face mask oxygen  Post-op Assessment: Report given to RN and Post -op Vital signs reviewed and stable  Post vital signs: Reviewed and stable  Last Vitals:  Vitals Value Taken Time  BP 142/72 10/25/2017  3:07 PM  Temp    Pulse 83 10/25/2017  3:11 PM  Resp 14 10/25/2017  3:11 PM  SpO2 100 % 10/25/2017  3:11 PM  Vitals shown include unvalidated device data.  Last Pain:  Vitals:   10/25/17 0638  TempSrc:   PainSc: 10-Worst pain ever      Patients Stated Pain Goal: 5 (32/76/14 7092)  Complications: No apparent anesthesia complications

## 2017-10-25 NOTE — Op Note (Addendum)
Procedure: Left common femoral endarterectomy with profundoplasty, left femoral to below-knee popliteal bypass with non-reversed left greater saphenous vein  Preoperative diagnosis: Gangrene right first toe  Postoperative diagnosis: Same  Anesthesia: General  Assistant: Gae Gallop, MD, Arlee Muslim, PA-C  Operative findings: #1 severe calcific disease right common femoral artery #2 good quality saphenous vein 3 to 5 mm  Operative details:  After obtaining informed consent, the patient was taken the operating room.  The patient was placed in supine position operating table.  After induction of general anesthesia and endotracheal intubation, the patient's entire left lower extremity was prepped and draped in usual sterile fashion.  Next a longitudinal incision was made in the left groin carried down to subcutaneous tissues down the level of the pre-existing aortobifemoral bypass.  There was dense scar tissue surrounding this.  This was all taken down with cautery.  I was able to get about 80% of the limb of the aortobifem dissected free circumferentially but could not separate the posterior wall completely due to fusion between the native common femoral and the graft.  Dissection was carried down the level of the common femoral artery and femoral bifurcation.  The profunda femoris and one large side branch were dissected free circumference and a Vesseloops placed around these.  The superficial femoral artery was also dissected free circumferentially and a vessel loop placed around this.  The circumflex iliac branches were dissected free circumferentially and ligated and divided between silk ties bilaterally to provide additional exposure in room for clamping proximally.  Common femoral artery was severely calcified.  There was a pulse within the left limb of aortobifemoral bypass graft.  Next I proceeded to dissect out the greater saphenous vein on the medial aspect the leg through several skip  incisions.  The vein was of good quality about 3 to 5 mm in diameter.  It was fairly tedious due to edema fluid constantly oozing from the surrounding tissues as well as several varicosities in the surrounding tissues.  The incision below the knee was deepened down in the below-knee popliteal space.  The popliteal vein was reflected posteriorly and the below-knee popliteal artery dissected free circumferentially all the way down to the level of the takeoff of the tibial vessels.  It was soft in character.   Next the tunnel was created between the heads of the gastrocnemius muscles up to the level of the groin.  The patient was given a total of 20,000 units of heparin during the case.  The distal saphenous vein was ligated with 2-0 silk tie the vein transected and the 7 femoral junction oversewn with a running 5-0 Prolene suture.  The vein was placed in a non-reversed configuration.  The limb of the aortobifemoral graft was controlled with a adult Gregory clamp.  The plan was to control the native common femoral with a Fogarty if necessary.  The profunda femoris and superficial femoral arteries were controlled with Vesseloops.  A longitudinal opening was made in the distal toe of the aortobifemoral bypass graft.  There was no flow within the graft at this point.  #4 Fogarty catheter was used to thrombectomize the left limb of the graft.  There was excellent arterial inflow after a large chunk of pseudointima was removed.  This was controlled with again with the [facility] clamp.  There is a large amount of exophytic calcified plaque within the common femoral artery so I did an endarterectomy from the common femoral artery down to the profunda.  The performed to  have been chronically occluded but I was able to do an eversion endarterectomy of this and get very good back bleeding from the profunda.  The superficial femoral artery was chronically occluded.  All loose debris was removed from the common femoral artery.   One area had torn on the medial wall and this was repaired with a running 5-0 Prolene suture.  Next the vein was placed in a non-reversed configuration and spatulated and sewn in the vein to side of artery using a running 5-0 Prolene suture.  Prior to completion of the anastomosis it was for blood back but thoroughly flushed the anastomosis was secured clamps released there was some bleeding along the medial wall adjacent to the performed at this was repaired with several 5-0 Prolene sutures.  Was then packed with Avitene.  The valves were then lysed with a valvulotome.  The vein was inspected and a few repair stitches were placed and some varicosities and small side branches.  This was done with 7-0 Prolene sutures.  The vein was marked for orientation and brought through a subsartorial tunnel down to the below-knee popliteal artery.  Below-knee palpatory was controlled proximally with a hemi-clamp and distally with a fine bulldog clamp.  It was opened longitudinally the vein cut the length and sewn in the vein to side of artery using a running 6-0 Prolene suture.  Just prior to completion of the anastomosis it was 4 by back but and thoroughly flushed.  Anastomosis was secured clamps released there is good pulsatile flow in the below-knee popliteal artery immediately.  There was biphasic posterior tibial and dorsalis pedis Doppler flow.  A completion arteriogram was not performed due to the patient's elevated creatinine.  Hemostasis was obtained with direct pressure and administration of 200 mg of protamine as well as one repair stitch at the heel of the distal anastomosis and one additional 6-0 Prolene at the area adjacent to the profunda.  All the incisions were then closed with multiple layers of running 2-0 and 3-0 Vicryl suture staples were placed in the skin at all the incisions except for the groin.  The groin was closed with a 4-0 Vicryl subcuticular stitch.  A Praveena VAC was applied to the left  groin.  Dr. Scot Dock was instrumental in assisting during the procedure as he assisted and exposing the below-knee popliteal artery and assistance with the lower portion of the greater saphenous vein as well as a portion of the femoral endarterectomy and proximal anastomosis.  Matt Eveland PA was instrumental during the case for providing adequate exposure due to the patient's severe obesity which made exposure of all of the vessels and dissection all very tedious.  The patient tolerated procedure well and there were no complications.  The estimate sponge needle count was correct at the end of the case.  The patient was taken to the recovery room in stable condition.  Ruta Hinds, MD [facility] [place] Office: [phone] Pager: [phone]

## 2017-10-25 NOTE — Interval H&P Note (Signed)
History and Physical Interval Note:  10/25/2017 7:19 AM  [name]  has presented today for surgery, with the diagnosis of claudication  The various methods of treatment have been discussed with the patient and family. After consideration of risks, benefits and other options for treatment, the patient has consented to  Procedure(s): BYPASS GRAFT FEMORAL-BELOW KNEE POPLITEAL ARTERY (Left) as a surgical intervention .  The patient's history has been reviewed, patient examined, no change in status, stable for surgery.  I have reviewed the patient's chart and labs.  Questions were answered to the patient's satisfaction.     Ruta Hinds

## 2017-10-25 NOTE — Progress Notes (Signed)
[facility] [place]  Subjective  - pt awake knows he is at [facility] did not know he had bypass   Objective (!) 113/40 (!) 52 (!) 97.2 F (36.2 C) (!) 21 100%  Intake/Output Summary (Last 24 hours) at 10/25/2017 1736 Last data filed at 10/25/2017 1509 Gross per 24 hour  Intake 2750 ml  Output 1525 ml  Net 1225 ml   Left leg no hematoma DP PT biphasic doppler  Assessment/Planning: Stable post op May get post op delirium has on several prior occasion Leave foley 48 hrs due to incontinence issues and fresh groin incision OOB tomorrow To 4E tomorrow if mental status reasonable Acute blood loss anemia will transfuse as this will most likely decline further with dilution and equilibration  Ruta Hinds 10/25/2017 5:36 PM --  Laboratory Lab Results: Recent Labs    10/25/17 1657  WBC 9.7  HGB 7.7*  HCT 24.5*  PLT 185   BMET Recent Labs    10/25/17 1657  CREATININE 1.68*    COAG Lab Results  Component Value Date   INR 1.13 10/21/2017   INR 1.08 01/18/2012   INR 1.17 12/21/2011   No results found for: PTT

## 2017-10-25 NOTE — Anesthesia Postprocedure Evaluation (Signed)
Anesthesia Post Note  Patient: [name]  Procedure(s) Performed: Left Common Femoral Endarterectomy and perfundaplasty, Left BYPASS GRAFT FEMORAL-BELOW KNEE POPLITEAL ARTERY. (Left Groin) Wound vac placement to left groin. (Left Groin)     Patient location during evaluation: PACU Anesthesia Type: General Level of consciousness: lethargic and patient cooperative Pain management: pain level controlled Vital Signs Assessment: post-procedure vital signs reviewed and stable Respiratory status: spontaneous breathing, nonlabored ventilation, respiratory function stable and patient connected to nasal cannula oxygen Cardiovascular status: blood pressure returned to baseline and stable Postop Assessment: no apparent nausea or vomiting Anesthetic complications: no    Last Vitals:  Vitals:   10/25/17 1522 10/25/17 1537  BP: (!) 112/46 (!) 118/42  Pulse: 74 66  Resp: 16 19  Temp:    SpO2: 96% 97%    Last Pain:  Vitals:   10/25/17 1537  TempSrc:   PainSc: Asleep                 [name]

## 2017-10-26 ENCOUNTER — Other Ambulatory Visit: Payer: Self-pay

## 2017-10-26 ENCOUNTER — Encounter (HOSPITAL_COMMUNITY): Payer: Self-pay | Admitting: Vascular Surgery

## 2017-10-26 ENCOUNTER — Inpatient Hospital Stay (HOSPITAL_COMMUNITY): Payer: Medicare HMO

## 2017-10-26 DIAGNOSIS — I739 Peripheral vascular disease, unspecified: Secondary | ICD-10-CM

## 2017-10-26 LAB — BASIC METABOLIC PANEL
ANION GAP: 10 (ref 5–15)
BUN: 39 mg/dL — ABNORMAL HIGH (ref 6–20)
CO2: 20 mmol/L — ABNORMAL LOW (ref 22–32)
Calcium: 8.2 mg/dL — ABNORMAL LOW (ref 8.9–10.3)
Chloride: 105 mmol/L (ref 101–111)
Creatinine, Ser: 1.76 mg/dL — ABNORMAL HIGH (ref 0.61–1.24)
GFR, EST AFRICAN AMERICAN: 41 mL/min — AB (ref >60)
GFR, EST NON AFRICAN AMERICAN: 35 mL/min — AB (ref >60)
Glucose, Bld: 144 mg/dL — ABNORMAL HIGH (ref 65–99)
POTASSIUM: 4.5 mmol/L (ref 3.5–5.1)
SODIUM: 135 mmol/L (ref 135–145)

## 2017-10-26 LAB — POCT I-STAT 4, (NA,K, GLUC, HGB,HCT)
GLUCOSE: 111 mg/dL — AB (ref 65–99)
Glucose, Bld: 114 mg/dL — ABNORMAL HIGH (ref 65–99)
Glucose, Bld: 118 mg/dL — ABNORMAL HIGH (ref 65–99)
HCT: 30 % — ABNORMAL LOW (ref 39.0–52.0)
HCT: 29 % — ABNORMAL LOW (ref 39.0–52.0)
HCT: 27 % — ABNORMAL LOW (ref 39.0–52.0)
HEMOGLOBIN: 10.2 g/dL — AB (ref 13.0–17.0)
HEMOGLOBIN: 9.9 g/dL — AB (ref 13.0–17.0)
Hemoglobin: 9.2 g/dL — ABNORMAL LOW (ref 13.0–17.0)
POTASSIUM: 3.9 mmol/L (ref 3.5–5.1)
Potassium: 4.2 mmol/L (ref 3.5–5.1)
Potassium: 4.1 mmol/L (ref 3.5–5.1)
SODIUM: 135 mmol/L (ref 135–145)
Sodium: 136 mmol/L (ref 135–145)
Sodium: 138 mmol/L (ref 135–145)

## 2017-10-26 LAB — CBC
HCT: 29.8 % — ABNORMAL LOW (ref 39.0–52.0)
HEMATOCRIT: 25.7 % — AB (ref 39.0–52.0)
Hemoglobin: 8.4 g/dL — ABNORMAL LOW (ref 13.0–17.0)
Hemoglobin: 9.9 g/dL — ABNORMAL LOW (ref 13.0–17.0)
MCH: 30.5 pg (ref 26.0–34.0)
MCH: 30.3 pg (ref 26.0–34.0)
MCHC: 32.7 g/dL (ref 30.0–36.0)
MCHC: 33.2 g/dL (ref 30.0–36.0)
MCV: 91.1 fL (ref 78.0–100.0)
MCV: 93.5 fL (ref 78.0–100.0)
PLATELETS: 148 10*3/uL — AB (ref 150–400)
PLATELETS: 150 10*3/uL (ref 150–400)
RBC: 2.75 MIL/uL — AB (ref 4.22–5.81)
RBC: 3.27 MIL/uL — AB (ref 4.22–5.81)
RDW: 16.5 % — ABNORMAL HIGH (ref 11.5–15.5)
RDW: 16.2 % — ABNORMAL HIGH (ref 11.5–15.5)
WBC: 7.4 10*3/uL (ref 4.0–10.5)
WBC: 8 10*3/uL (ref 4.0–10.5)

## 2017-10-26 LAB — POCT ACTIVATED CLOTTING TIME
ACTIVATED CLOTTING TIME: 208 s
ACTIVATED CLOTTING TIME: 263 s
ACTIVATED CLOTTING TIME: 235 s
Activated Clotting Time: 241 seconds
Activated Clotting Time: 219 seconds

## 2017-10-26 LAB — PREPARE RBC (CROSSMATCH)

## 2017-10-26 MED ORDER — SODIUM CHLORIDE 0.9 % IV SOLN
Freq: Once | INTRAVENOUS | Status: AC
  Administered 2017-10-26: 02:00:00 via INTRAVENOUS

## 2017-10-26 NOTE — Progress Notes (Addendum)
[facility] [place]  Subjective  - feels ok doesn't seem as confused   Objective (!) 99/41 61 98.1 F (36.7 C) (!) 31 100%  Intake/Output Summary (Last 24 hours) at 10/26/2017 0952 Last data filed at 10/26/2017 0800 Gross per 24 hour  Intake 3891.67 ml  Output 1690 ml  Net 2201.67 ml   Left foot pink warm edematous, biphasic DP PT doppler Calf thigh soft no obvious hematoma  Assessment/Planning: Oliguric overnight improved post transfusion Carb diet To 4e Ambulate Home when pain controlled and ambulatory  Ruta Hinds 10/26/2017 9:52 AM --  Laboratory Lab Results: Recent Labs    10/26/17 0056 10/26/17 0522  WBC 8.0 7.4  HGB 8.4* 9.9*  HCT 25.7* 29.8*  PLT 150 148*   BMET Recent Labs    10/25/17 1657 10/26/17 0522  NA  --  135  K  --  4.5  CL  --  105  CO2  --  20*  GLUCOSE  --  144*  BUN  --  39*  CREATININE 1.68* 1.76*  CALCIUM  --  8.2*    COAG Lab Results  Component Value Date   INR 1.13 10/21/2017   INR 1.08 01/18/2012   INR 1.17 12/21/2011   No results found for: PTT

## 2017-10-26 NOTE — Progress Notes (Signed)
MD notified of blood pressure with MAP's in the 40's and low urine output. RN currently running PRN NS bolus for pressure support. MD advised RN to skip lasix between the 2 units of blood and collect CBC when the 2 units had finished. Will continue to monitor closely.

## 2017-10-26 NOTE — Evaluation (Signed)
Occupational Therapy Evaluation Patient Details Name: [name] MRN: [id number] DOB: 08-04-1940 Today's Date: 10/26/2017    History of Present Illness [name] s/p left bypass graft femoral below knee popliteal artery on 10/25/17. PMH including PVD, HTN, DM, peripheral neuropathy, CABG (2013), and bilateral rotator cuff repairs.    Clinical Impression   PTA, pt was living with his wife who recently had been assisting with LB ADLs. Pt used a RW for functional mobility. Currently, pt requiring Min A for UB ADLs, Max A for LB ADLs, and Mod-Max A +2 for bed mobility. Pt performing grooming tasks at EOB with support at LLE. Pt agreeable for excercises to RLE exercises at EOB. Pt highly limited by pain in LLE. Pt will require further acute OT to facilitate safe dc. Pending pt's progress, recommend dc to SNF for further OT to optimize safety, independence with ADLs, and return to PLOF.      Follow Up Recommendations  SNF;Supervision/Assistance - 24 hour    Equipment Recommendations  Other (comment)(Defer to next venue)    Recommendations for Other Services PT consult     Precautions / Restrictions Precautions Precautions: Fall      Mobility Bed Mobility Overal bed mobility: Needs Assistance Bed Mobility: Supine to Sit;Sit to Supine     Supine to sit: Mod assist;+2 for physical assistance Sit to supine: Max assist;+2 for physical assistance   General bed mobility comments: Mod A +2 to bring LLE towards EOB and then bringing hips around and elevate trunk. Max A+2 to bring BLEs over EOB and transition to supine.   Transfers Overall transfer level: Needs assistance               General transfer comment: Defered due to pain and lethargy    Balance Overall balance assessment: Needs assistance Sitting-balance support: Feet unsupported;No upper extremity supported Sitting balance-Leahy Scale: Fair Sitting balance - Comments: Able to maintain static sitting at EOB                                    ADL either performed or assessed with clinical judgement   ADL Overall ADL's : Needs assistance/impaired Eating/Feeding: Set up;Bed level   Grooming: Wash/dry face;Min guard;Sitting;Minimal assistance Grooming Details (indicate cue type and reason): Pt performing grooming tasks at EOB with Min A to support for LLE.  Upper Body Bathing: Minimal assistance;Sitting Upper Body Bathing Details (indicate cue type and reason): MIn A for bathing back Lower Body Bathing: Maximal assistance;Bed level   Upper Body Dressing : Minimal assistance;Sitting   Lower Body Dressing: Maximal assistance;Sit to/from stand Lower Body Dressing Details (indicate cue type and reason): Max A to don socks               General ADL Comments: Pt highly focused on pain in LLE. Pt performing bed mobility and grooming at EOB. Self limiting behavior and requiring increased encouragement to participate in mobility and ADLs     Vision         Perception     Praxis      Pertinent Vitals/Pain Pain Assessment: Faces Faces Pain Scale: Hurts even more Pain Location: LLE; big toe Pain Descriptors / Indicators: Constant;Discomfort;Grimacing;Moaning Pain Intervention(s): Monitored during session;Limited activity within patient's tolerance;RN gave pain meds during session;Repositioned;Relaxation     Hand Dominance Right   Extremity/Trunk Assessment Upper Extremity Assessment Upper Extremity Assessment: Generalized weakness   Lower Extremity  Assessment Lower Extremity Assessment: Defer to PT evaluation   Cervical / Trunk Assessment Cervical / Trunk Assessment: Other exceptions Cervical / Trunk Exceptions: increased body habitus   Communication Communication Communication: No difficulties   Cognition Arousal/Alertness: Lethargic;Suspect due to medications Behavior During Therapy: [facility] for tasks assessed/performed Overall Cognitive Status: Impaired/Different  from baseline Area of Impairment: Following commands;Problem solving;Awareness                       Following Commands: Follows one step commands with increased time;Follows one step commands inconsistently   Awareness: Emergent Problem Solving: Slow processing;Requires verbal cues;Requires tactile cues General Comments: Pt with slow process and requires increased time and cues. Pt perseverating on topic of pain. Pt with disconnected conversation switching topics.    General Comments  BP before activity 150/55 and after activity 143/89. HR 68-74. RR 12-17. SpO2 97% 3L    Exercises Exercises: General Lower Extremity General Exercises - Lower Extremity Ankle Circles/Pumps: AROM;Both;5 reps;Seated;Supine Quad Sets: AROM;Right;10 reps;Seated   Shoulder Instructions      Home Living Family/patient expects to be discharged to:: Private residence Living Arrangements: Spouse/significant other Available Help at Discharge: Family;Available 24 hours/day Type of Home: House Home Access: Stairs to enter [employer] of Steps: 3 Entrance Stairs-Rails: Left Home Layout: One level     Bathroom Shower/Tub: Occupational psychologist: Handicapped height     Home Equipment: Clinical cytogeneticist - 2 wheels(shower chair doesnt fit in shower)          Prior Functioning/Environment Level of Independence: Needs assistance  Gait / Transfers Assistance Needed: RW for functional mobility ADL's / Homemaking Assistance Needed: Wife assisted with LB ADLs            OT Problem List: Decreased strength;Decreased range of motion;Decreased activity tolerance;Impaired balance (sitting and/or standing);Decreased cognition;Decreased safety awareness;Decreased knowledge of use of DME or AE;Decreased knowledge of precautions;Increased edema;Pain      OT Treatment/Interventions: Self-care/ADL training;Therapeutic exercise;Energy conservation;DME and/or AE instruction;Therapeutic  activities;Patient/family education    OT Goals(Current goals can be found in the care plan section) Acute Rehab OT Goals Patient Stated Goal: Stop pain OT Goal Formulation: With patient Time For Goal Achievement: 11/09/17 Potential to Achieve Goals: Good ADL Goals Pt Will Perform Grooming: with set-up;with supervision;sitting Pt Will Perform Upper Body Dressing: with set-up;with supervision;sitting Pt Will Perform Lower Body Dressing: with min guard assist;sit to/from stand;with adaptive equipment Pt Will Transfer to Toilet: with min guard assist;ambulating;stand pivot transfer;bedside commode Pt Will Perform Toileting - Clothing Manipulation and hygiene: with min guard assist;sit to/from stand  OT Frequency: Min 2X/week   Barriers to D/C:            Co-evaluation              AM-PAC PT "6 Clicks" Daily Activity     Outcome Measure Help from another person eating meals?: A Little Help from another person taking care of personal grooming?: A Little Help from another person toileting, which includes using toliet, bedpan, or urinal?: A Lot Help from another person bathing (including washing, rinsing, drying)?: A Lot Help from another person to put on and taking off regular upper body clothing?: A Little Help from another person to put on and taking off regular lower body clothing?: A Lot 6 Click Score: 15   End of Session Equipment Utilized During Treatment: Oxygen Nurse Communication: Mobility status;Other (comment);Patient requests pain meds(Pain level)  Activity Tolerance: Patient limited by pain Patient left:  in bed;with call bell/phone within reach  OT Visit Diagnosis: Unsteadiness on feet (R26.81);Other abnormalities of gait and mobility (R26.89);Muscle weakness (generalized) (M62.81);History of falling (Z91.81);Pain Pain - Right/Left: Left Pain - part of body: Leg;Ankle and joints of foot                Time: 3267-1245 OT Time Calculation (min): 46 min Charges:   OT General Charges $OT Visit: 1 Visit OT Evaluation $OT Eval Moderate Complexity: 1 Mod OT Treatments $Self Care/Home Management : 23-37 mins G-Codes:     [place], OTR/L Acute Rehab Pager: [phone] Office: [place] 10/26/2017, 9:09 AM

## 2017-10-26 NOTE — Evaluation (Signed)
Physical Therapy Evaluation Patient Details Name: [name] MRN: [id number] DOB: 07/27/1940 Today's Date: 10/26/2017   History of Present Illness  [name] s/p left bypass graft femoral below knee popliteal artery on 10/25/17. PMH including PVD, HTN, DM, peripheral neuropathy, CABG (2013), and bilateral rotator cuff repairs.   Clinical Impression  Pt OOB mobility greatly limited by L LE pain and swelling. Pt requires maxA for L LE management. Pt unable to tolerate L LE WBing at this time. Pt to benefit ST-SNF upon d/c to achieve supervision level of function for safe transition home with spouse.    Follow Up Recommendations SNF;Supervision/Assistance - 24 hour    Equipment Recommendations  Rolling walker with 5" wheels    Recommendations for Other Services       Precautions / Restrictions Precautions Precautions: Fall Restrictions Weight Bearing Restrictions: Yes LLE Weight Bearing: Weight bearing as tolerated Other Position/Activity Restrictions: L post op shoe to protect necrotic great toe      Mobility  Bed Mobility Overal bed mobility: Needs Assistance Bed Mobility: Supine to Sit;Sit to Supine     Supine to sit: Mod assist;+2 for physical assistance Sit to supine: Max assist;+2 for physical assistance   General bed mobility comments: maxA for L LE management, mod/maxAx2 for trunk elevation and to bring hips to EOB  Transfers Overall transfer level: Needs assistance Equipment used: Rolling walker (2 wheeled) Transfers: Sit to/from Stand Sit to Stand: Min assist;+2 safety/equipment         General transfer comment: pt pulled up on walker due to unable to pushed up from bed due to IV placement and rigid R wrist splint into extension. increased R LE WBing, PT and tech held walker down  Ambulation/Gait             General Gait Details: unable to tolerate ambulation at this time but did complete 6 side steps to [facility]. pt with minimal L LE WBing tolerance,  increased UE WBing, difficulty moving L LE due to pain  Stairs            Wheelchair Mobility    Modified Rankin (Stroke Patients Only)       Balance Overall balance assessment: Needs assistance Sitting-balance support: Feet unsupported;No upper extremity supported Sitting balance-Leahy Scale: Fair Sitting balance - Comments: Able to maintain static sitting at EOB                                     Pertinent Vitals/Pain Pain Assessment: 0-10 Faces Pain Scale: Hurts worst Pain Location: LLE; big toe(with movement and WBing, 2/10 at rest) Pain Descriptors / Indicators: Constant;Discomfort;Grimacing;Moaning Pain Intervention(s): Monitored during session    Home Living Family/patient expects to be discharged to:: Private residence Living Arrangements: Spouse/significant other Available Help at Discharge: Family;Available 24 hours/day Type of Home: House Home Access: Stairs to enter Entrance Stairs-Rails: Left Entrance Stairs-Number of Steps: 3 Home Layout: One level Home Equipment: Clinical cytogeneticist - 2 wheels(shower chair doesnt fit in shower)      Prior Function Level of Independence: Needs assistance   Gait / Transfers Assistance Needed: RW for functional mobility  ADL's / Homemaking Assistance Needed: Wife assisted with LB ADLs        Hand Dominance   Dominant Hand: Right    Extremity/Trunk Assessment   Upper Extremity Assessment Upper Extremity Assessment: Generalized weakness    Lower Extremity Assessment Lower Extremity Assessment: LLE  deficits/detail LLE Deficits / Details: able to complete L quad set, difficulty with LAQ and hip flexion due to wound vac at groin, pain, limited ankle ROM due to swelling and pain LLE Sensation: decreased light touch    Cervical / Trunk Assessment Cervical / Trunk Assessment: Other exceptions Cervical / Trunk Exceptions: increased body habitus  Communication   Communication: No difficulties   Cognition Arousal/Alertness: Awake/alert Behavior During Therapy: WFL for tasks assessed/performed Overall Cognitive Status: Within Functional Limits for tasks assessed Area of Impairment: Following commands                               General Comments: Pt HOH and distracted by pain but was able to follow all commands and executed appropriatedly      General Comments      Exercises General Exercises - Lower Extremity Ankle Circles/Pumps: AROM;Both;5 reps;Seated;Supine Quad Sets: AROM;Right;10 reps;Seated   Assessment/Plan    PT Assessment Patient needs continued PT services  PT Problem List Decreased strength;Decreased range of motion;Decreased activity tolerance;Decreased balance;Decreased mobility;Decreased coordination;Decreased cognition;Decreased knowledge of use of DME;Decreased safety awareness;Pain       PT Treatment Interventions DME instruction;Functional mobility training;Stair training;Gait training;Therapeutic activities;Therapeutic exercise;Balance training    PT Goals (Current goals can be found in the Care Plan section)  Acute Rehab PT Goals Patient Stated Goal: Stop pain PT Goal Formulation: With patient Time For Goal Achievement: 11/09/17 Potential to Achieve Goals: Good    Frequency Min 3X/week   Barriers to discharge        Co-evaluation               AM-PAC PT "6 Clicks" Daily Activity  Outcome Measure Difficulty turning over in bed (including adjusting bedclothes, sheets and blankets)?: Unable Difficulty moving from lying on back to sitting on the side of the bed? : Unable Difficulty sitting down on and standing up from a chair with arms (e.g., wheelchair, bedside commode, etc,.)?: Unable Help needed moving to and from a bed to chair (including a wheelchair)?: A Lot Help needed walking in hospital room?: A Lot Help needed climbing 3-5 steps with a railing? : Total 6 Click Score: 8    End of Session Equipment Utilized  During Treatment: Gait belt(L post op shoe) Activity Tolerance: Patient limited by pain Patient left: in bed;with call bell/phone within reach(vascular tech present for bedside procedure) Nurse Communication: Mobility status PT Visit Diagnosis: Unsteadiness on feet (R26.81);Pain Pain - Right/Left: Left Pain - part of body: Ankle and joints of foot    Time: 1105-1135 PT Time Calculation (min) (ACUTE ONLY): 30 min   Charges:   PT Evaluation $PT Eval Moderate Complexity: 1 Mod PT Treatments $Therapeutic Activity: 8-22 mins   PT G Codes:        Kittie Plater, PT, DPT Pager #: [phone] Office #: [phone]   [place] 10/26/2017, 12:42 PM

## 2017-10-26 NOTE — Clinical Social Work Note (Signed)
Clinical Social Work Assessment  Patient Details  Name: [name] MRN: [id number] Date of Birth: 04-21-1941  Date of referral:  10/26/17               Reason for consult:  Facility Placement                Permission sought to share information with:  Chartered certified accountant granted to share information::  Yes, Verbal Permission Granted  Name::     Electrical engineer::  SNF  Relationship::  wife  Contact Information:     Housing/Transportation Living arrangements for the past 2 months:  Single Family Home Source of Information:  Patient, Spouse Patient Interpreter Needed:  None Criminal Activity/Legal Involvement Pertinent to Current Situation/Hospitalization:  No - Comment as needed Significant Relationships:  Spouse, Adult Children Lives with:  Spouse Do you feel safe going back to the place where you live?  No Need for family participation in patient care:  No (Coment)  Care giving concerns:  Pt lives at home with wife but currently needing 2+ assist to mobilize which would not be manageable at home.   Social Worker assessment / plan:  CSW spoke with pt and pt wife concerning PT recommendation for SNF at time of DC.  Pt reports having been to SNF in the past and had recent home health bouts so is familiar with his options.  Employment status:  Retired Surveyor, [employer] Care PT Recommendations:  [place] / Referral to community resources:  [place]  Patient/Family's Response to care:  Pt agreeable to having referral sent out to SNFs but is unsure what he wants to do at this time.  Reports that he is too distracted by his pain to talk further at this time or make a decision regarding disposition.  Patient/Family's Understanding of and Emotional Response to Diagnosis, Current Treatment, and Prognosis:  Pt has good understanding of limitations but is hopeful that he will improve during hospital  stay.  Emotional Assessment Appearance:  Appears stated age Attitude/Demeanor/Rapport:    Affect (typically observed):  Appropriate, Pleasant Orientation:  Oriented to  Time, Oriented to Place, Oriented to Self, Oriented to Situation Alcohol / Substance use:  Not Applicable Psych involvement (Current and /or in the community):  No (Comment)  Discharge Needs  Concerns to be addressed:  Care Coordination Readmission within the last 30 days:  No Current discharge risk:  Physical Impairment Barriers to Discharge:  Continued Medical Work up   Jorge Ny, LCSW 10/26/2017, 3:45 PM

## 2017-10-26 NOTE — NC FL2 (Signed)
[place] LEVEL OF CARE SCREENING TOOL     IDENTIFICATION  Patient Name: [name] Birthdate: September 14, 1940 Sex: male Admission Date (Current Location): 10/25/2017  [facility] and [place] Number:  Herbalist and Address:  [facility] [place], [place] [address] [place], [place] [address]      Provider Number: [id number]  Attending Physician Name and Address:  Elam Dutch, MD  Relative Name and Phone Number:       Current Level of Care: Hospital Recommended Level of Care: [place] Prior Approval Number:    Date Approved/Denied:   PASRR Number:    Discharge Plan: SNF    Current Diagnoses: Patient Active Problem List   Diagnosis Date Noted  . PAD (peripheral artery disease) ([place]) 10/25/2017  . Polyneuropathy associated with underlying disease ([place]) 08/16/2017  . Essential hypertension 02/06/2014  . Chronic diastolic heart failure ([place]) 02/06/2014  . Chronic kidney disease, stage III (moderate) ([place]) 02/06/2014  . Aftercare following surgery of the circulatory system, [place] 01/25/2014  . Carotid stenosis 06/08/2013  . Stricture of artery ([place]) 04/14/2012  . Peripheral vascular disease, unspecified ([place]) 10/08/2011  . DVT of upper extremity (deep vein thrombosis), left 09/29/2011  . S/P CABG (coronary artery bypass graft) 09/29/2011  . H/O carotid endarterectomy 09/29/2011  . Occlusion and stenosis of carotid artery without mention of cerebral infarction 06/11/2011  . Diabetes mellitus type 2 with complications ([place]) 17/51/0258    Orientation RESPIRATION BLADDER Height & Weight     Self, Time, Situation, Place  O2(2L [place]) Incontinent, Indwelling catheter Weight: 256 lb (116.1 kg) Height:  5\' 11"  (180.3 cm)  BEHAVIORAL SYMPTOMS/MOOD NEUROLOGICAL BOWEL NUTRITION STATUS      Continent Diet(see DC summary)  AMBULATORY STATUS COMMUNICATION OF NEEDS Skin   Extensive Assist Verbally Surgical wounds, Wound Vac([place]  wound vac on groin incision)                       Personal Care Assistance Level of Assistance  Bathing, Dressing Bathing Assistance: Maximum assistance   Dressing Assistance: Maximum assistance     Functional Limitations Info             SPECIAL CARE FACTORS FREQUENCY  PT (By licensed PT), OT (By licensed OT)     PT Frequency: 5/wk OT Frequency: 5/wk            Contractures      Additional Factors Info  Code Status, Allergies Code Status Info: FULL Allergies Info: Amoxicillin-pot Clavulanate, Morphine And Related           Current Medications (10/26/2017):  This is the current hospital active medication list Current Facility-Administered Medications  Medication Dose Route Frequency Provider Last Rate Last Dose  . 0.9 %  sodium chloride infusion   Intravenous Once Dagoberto Ligas, PA-C      . acetaminophen (TYLENOL) tablet 325-650 mg  325-650 mg Oral Q4H PRN Dagoberto Ligas, PA-C       Or  . acetaminophen (TYLENOL) suppository 325-650 mg  325-650 mg Rectal Q4H PRN Dagoberto Ligas, PA-C      . alum & mag hydroxide-simeth (MAALOX/MYLANTA) 200-200-20 MG/5ML suspension 15-30 mL  15-30 mL Oral Q2H PRN Dagoberto Ligas, PA-C      . aspirin EC tablet 325 mg  325 mg Oral QPM Dagoberto Ligas, PA-C   325 mg at 10/25/17 1946  . atorvastatin (LIPITOR) tablet 20 mg  20 mg Oral QPM Dagoberto Ligas, PA-C  20 mg at 10/25/17 1946  . docusate sodium (COLACE) capsule 100 mg  100 mg Oral Daily Dagoberto Ligas, PA-C   100 mg at 10/26/17 1055  . fenofibrate tablet 54 mg  54 mg Oral Daily Dagoberto Ligas, PA-C   54 mg at 10/26/17 1055  . furosemide (LASIX) tablet 40 mg  40 mg Oral Daily PRN Dagoberto Ligas, PA-C   40 mg at 10/26/17 0308  . gabapentin (NEURONTIN) capsule 600 mg  600 mg Oral Daily Dagoberto Ligas, PA-C   600 mg at 10/26/17 1054   And  . gabapentin (NEURONTIN) capsule 300 mg  300 mg Oral QHS Dagoberto Ligas, PA-C   300 mg at 10/25/17 2300  .  guaiFENesin-dextromethorphan (ROBITUSSIN DM) 100-10 MG/5ML syrup 15 mL  15 mL Oral Q4H PRN Dagoberto Ligas, PA-C      . heparin injection 5,000 Units  5,000 Units Subcutaneous Q8H Dagoberto Ligas, PA-C   5,000 Units at 10/26/17 1313  . hydrALAZINE (APRESOLINE) injection 5 mg  5 mg Intravenous Q20 Min PRN Dagoberto Ligas, PA-C   5 mg at 10/25/17 1859  . HYDROcodone-acetaminophen (NORCO/VICODIN) 5-325 MG per tablet 1-2 tablet  1-2 tablet Oral Q4H PRN Dagoberto Ligas, PA-C   1 tablet at 10/26/17 0817  . HYDROmorphone (DILAUDID) injection 1 mg  1 mg Intravenous Q3H PRN Dagoberto Ligas, PA-C   1 mg at 10/25/17 2201  . labetalol (NORMODYNE,TRANDATE) injection 10 mg  10 mg Intravenous Q10 min PRN Dagoberto Ligas, PA-C      . levothyroxine (SYNTHROID, LEVOTHROID) tablet 50 mcg  50 mcg Oral QAC breakfast Dagoberto Ligas, PA-C   50 mcg at 10/26/17 5056  . magnesium sulfate IVPB 2 g 50 mL  2 g Intravenous Daily PRN Dagoberto Ligas, PA-C      . metoprolol tartrate (LOPRESSOR) injection 2-5 mg  2-5 mg Intravenous Q2H PRN Dagoberto Ligas, PA-C      . metoprolol tartrate (LOPRESSOR) tablet 50 mg  50 mg Oral BID Dagoberto Ligas, PA-C   Stopped at 10/26/17 1046  . ondansetron (ZOFRAN) injection 4 mg  4 mg Intravenous Q6H PRN Dagoberto Ligas, PA-C      . pantoprazole (PROTONIX) EC tablet 40 mg  40 mg Oral Daily Dagoberto Ligas, PA-C   40 mg at 10/26/17 1054  . phenol (CHLORASEPTIC) mouth spray 1 spray  1 spray Mouth/Throat PRN Dagoberto Ligas, PA-C      . pioglitazone (ACTOS) tablet 30 mg  30 mg Oral Daily Dagoberto Ligas, PA-C   30 mg at 10/26/17 1055  . potassium chloride SA (K-DUR,KLOR-CON) CR tablet 20 mEq  20 mEq Oral Daily PRN Dagoberto Ligas, PA-C      . potassium chloride SA (K-DUR,KLOR-CON) CR tablet 20-40 mEq  20-40 mEq Oral Daily PRN Dagoberto Ligas, PA-C      . ramipril (ALTACE) capsule 10 mg  10 mg Oral Daily Dagoberto Ligas, PA-C   Stopped at 10/26/17 1047  . temazepam (RESTORIL)  capsule 15 mg  15 mg Oral QHS PRN Dagoberto Ligas, PA-C         Discharge Medications: Please see discharge summary for a list of discharge medications.  Relevant Imaging Results:  Relevant Lab Results:   Additional Information SS#: [id number]  Jorge Ny, LCSW

## 2017-10-26 NOTE — Care Management Note (Signed)
Case Management Note Marvetta Gibbons RN,BSN Unit [facility] 1-22 Case Manager  [phone]  Patient Details  Name: [name] MRN: [id number] Date of Birth: 09-05-1940  Subjective/Objective:    Pt admitted s/p Left common femoral endarterectomy with profundoplasty, left femoral to below-knee popliteal bypass                 Action/Plan: PTA pt lived at home with spouse- PT/OT evals pending- may need STSNF- CSW consulted for possible placement needs- CM will follow  Expected Discharge Date:                  Expected Discharge Plan:  Skilled Nursing Facility  In-House Referral:  Clinical Social Work  Discharge planning Services  CM Consult  Post Acute Care Choice:    Choice offered to:     DME Arranged:    DME Agency:     HH Arranged:    [place] Agency:     Status of Service:  In process, will continue to follow  If discussed at Long Length of Stay Meetings, dates discussed:    Discharge Disposition:   Additional Comments:  Dawayne Patricia, RN 10/26/2017, 12:11 PM

## 2017-10-26 NOTE — Progress Notes (Signed)
ABI's have been completed. Right 0.58 Left 0.57  10/26/17 12:12 PM [name] RVT

## 2017-10-26 NOTE — Progress Notes (Signed)
Dr Oneida Alar made aware of CBC results post 2 units of blood, also patient's continued low blood pressure and low urine output. MD request 2 additional units and lasix in between. Will continue to monitor closely.

## 2017-10-27 LAB — CBC
HEMATOCRIT: 25.2 % — AB (ref 39.0–52.0)
Hemoglobin: 8.5 g/dL — ABNORMAL LOW (ref 13.0–17.0)
MCH: 30.9 pg (ref 26.0–34.0)
MCHC: 33.7 g/dL (ref 30.0–36.0)
MCV: 91.6 fL (ref 78.0–100.0)
Platelets: 160 10*3/uL (ref 150–400)
RBC: 2.75 MIL/uL — ABNORMAL LOW (ref 4.22–5.81)
RDW: 17.1 % — AB (ref 11.5–15.5)
WBC: 11.4 10*3/uL — AB (ref 4.0–10.5)

## 2017-10-27 LAB — BASIC METABOLIC PANEL
ANION GAP: 9 (ref 5–15)
BUN: 47 mg/dL — ABNORMAL HIGH (ref 6–20)
CALCIUM: 8.2 mg/dL — AB (ref 8.9–10.3)
CO2: 21 mmol/L — AB (ref 22–32)
CREATININE: 2.37 mg/dL — AB (ref 0.61–1.24)
Chloride: 100 mmol/L — ABNORMAL LOW (ref 101–111)
GFR, EST AFRICAN AMERICAN: 29 mL/min — AB (ref >60)
GFR, EST NON AFRICAN AMERICAN: 25 mL/min — AB (ref >60)
Glucose, Bld: 160 mg/dL — ABNORMAL HIGH (ref 65–99)
Potassium: 5.2 mmol/L — ABNORMAL HIGH (ref 3.5–5.1)
SODIUM: 130 mmol/L — AB (ref 135–145)

## 2017-10-27 LAB — GLUCOSE, CAPILLARY
GLUCOSE-CAPILLARY: 185 mg/dL — AB (ref 65–99)
GLUCOSE-CAPILLARY: 151 mg/dL — AB (ref 65–99)

## 2017-10-27 MED ORDER — SODIUM CHLORIDE 0.9 % IV BOLUS
1000.0000 mL | Freq: Once | INTRAVENOUS | Status: AC
  Administered 2017-10-27: 1000 mL via INTRAVENOUS

## 2017-10-27 NOTE — Progress Notes (Signed)
Dagoberto Ligas, PA paged at this time.

## 2017-10-27 NOTE — Progress Notes (Signed)
Dagoberto Ligas, PA paged at this time

## 2017-10-27 NOTE — Progress Notes (Signed)
Clinical Social Worker following patient for support and discharge need. CSW met patients family at the front desk of unit to discuss rehab facility. CSW gave family an updated list of the facilities in the area that has made a bed offer on patient. CSW to follow up with family once they have chosen a bed.   , MSW,  LCSWA [phone]   

## 2017-10-27 NOTE — Progress Notes (Signed)
  Progress Note    10/27/2017 7:25 AM 2 Days Post-Op  Subjective:  Patient says he was OOB and walking yesterday however the nurse states he has not been OOB and patient required 3 person assist to transfer from wheelchair to bed.   Vitals:   10/26/17 2346 10/27/17 0409  BP: (!) 151/47 (!) 138/52  Pulse: 73 80  Resp: 19 (!) 21  Temp: 97.7 F (36.5 C) 98 F (36.7 C)  SpO2: 100% 99%   Physical Exam: Lungs:  Non labored on [place] Incisions:  Incisions of LLE soft without active drainage or oozing; incisional vac on L groin Extremities:  Multiphasic PTA and DP by doppler Abdomen:  Soft Neurologic: A&O  CBC    Component Value Date/Time   WBC 7.4 10/26/2017 0522   RBC 3.27 (L) 10/26/2017 0522   HGB 9.9 (L) 10/26/2017 0522   HCT 29.8 (L) 10/26/2017 0522   PLT 148 (L) 10/26/2017 0522   MCV 91.1 10/26/2017 0522   MCH 30.3 10/26/2017 0522   MCHC 33.2 10/26/2017 0522   RDW 16.2 (H) 10/26/2017 0522   LYMPHSABS 1.0 03/09/2011 1418   MONOABS 0.5 03/09/2011 1418   EOSABS 0.2 03/09/2011 1418   BASOSABS 0.0 03/09/2011 1418    BMET    Component Value Date/Time   NA 135 10/26/2017 0522   K 4.5 10/26/2017 0522   CL 105 10/26/2017 0522   CO2 20 (L) 10/26/2017 0522   GLUCOSE 144 (H) 10/26/2017 0522   BUN 39 (H) 10/26/2017 0522   CREATININE 1.76 (H) 10/26/2017 0522   CREATININE 1.32 04/18/2012 0949   CALCIUM 8.2 (L) 10/26/2017 0522   GFRNONAA 35 (L) 10/26/2017 0522   GFRAA 41 (L) 10/26/2017 0522    INR    Component Value Date/Time   INR 1.13 10/21/2017 1233     Intake/Output Summary (Last 24 hours) at 10/27/2017 0725 Last data filed at 10/27/2017 0644 Gross per 24 hour  Intake 1010 ml  Output 725 ml  Net 285 ml     Assessment/Plan:  77 y.o. male is s/p L CFA ndarterectomy, femoral to BK pop with vein 2 Days Post-Op   Foley out; unable to place condom cath; UO 321mL overnight; recheck labs this am Encouraged OOB and participation with therapy teams Dry dressing changes  prn and daily LLE incisions; continue incisional vac L groin Patent bypass with pedal signals by doppler; continue periodic doppler checks D/c when mobility improved  DVT prophylaxis:  subq heparin   Dagoberto Ligas, PA-C [facility] [phone] 10/27/2017 7:25 AM

## 2017-10-27 NOTE — Progress Notes (Signed)
Physical Therapy Treatment Patient Details Name: JARREN PARA MRN: [id number] DOB: 1940/12/13 Today's Date: 10/27/2017    History of Present Illness [name] s/p left bypass graft femoral below knee popliteal artery on 10/25/17. PMH including PVD, HTN, DM, peripheral neuropathy, CABG (2013), and bilateral rotator cuff repairs.     PT Comments    Patient seen for activity progression. Initially lethargic, max cues to direct to task. Some confusion and reluctance noted. Moderate to max assist for all aspects of mobility. (Heavy assist). Manual facilitation of movement. Limited significantly by pain. Current  POC remains appropriate. Noted significant discoloration in LLE when mobilizing, improved with off loading and elevation.   Follow Up Recommendations  SNF;Supervision/Assistance - 24 hour     Equipment Recommendations  Rolling walker with 5" wheels    Recommendations for Other Services       Precautions / Restrictions Precautions Precautions: Fall Restrictions Weight Bearing Restrictions: Yes LLE Weight Bearing: Weight bearing as tolerated Other Position/Activity Restrictions: L post op shoe to protect necrotic great toe    Mobility  Bed Mobility Overal bed mobility: Needs Assistance Bed Mobility: Supine to Sit;Sit to Supine     Supine to sit: Mod assist;+2 for physical assistance Sit to supine: Max assist;+2 for physical assistance   General bed mobility comments: use of bed and rails for mechanical advantage. Moderate assist to elevate trunk to upright and position at EOB (+2) Max assist to return to bed.   Transfers Overall transfer level: Needs assistance Equipment used: Rolling walker (2 wheeled) Transfers: Sit to/from Stand Sit to Stand: Min assist;+2 safety/equipment         General transfer comment: Patient able to power up to RW with MAX cues and min assist of 2 from elevate surface height  Ambulation/Gait Ambulation/Gait assistance: Mod  assist Ambulation Distance (Feet): 9 Feet Assistive device: Rolling walker (2 wheeled) Gait Pattern/deviations: Step-to pattern;Decreased stride length;Decreased dorsiflexion - left;Decreased weight shift to right;Wide base of support;Trunk flexed Gait velocity: decreased Gait velocity interpretation: <1.31 ft/sec, indicative of household ambulator General Gait Details: Patient required increased physical assist for all aspects of mobility, extremely anxious. Significant time and effort to perform. Manual weight shift to the right and use of manual assist to move LLE.   Stairs             Wheelchair Mobility    Modified Rankin (Stroke Patients Only)       Balance Overall balance assessment: Needs assistance Sitting-balance support: Feet unsupported;No upper extremity supported Sitting balance-Leahy Scale: Fair Sitting balance - Comments: Able to maintain static sitting at EOB                                    Cognition Arousal/Alertness: Lethargic Behavior During Therapy: Anxious Overall Cognitive Status: Within Functional Limits for tasks assessed Area of Impairment: Following commands                       Following Commands: Follows one step commands with increased time;Follows one step commands inconsistently   Awareness: Emergent Problem Solving: Slow processing;Requires verbal cues;Requires tactile cues General Comments: HOH, anxious and max cues to direct to task      Exercises      General Comments        Pertinent Vitals/Pain Pain Assessment: 0-10 Faces Pain Scale: Hurts worst Pain Location: LLE; big toe Pain Descriptors / Indicators: Constant;Discomfort;Grimacing;Moaning  Pain Intervention(s): Monitored during session    Home Living Family/patient expects to be discharged to:: Private residence Living Arrangements: Spouse/significant other Available Help at Discharge: Family;Available 24 hours/day Type of Home:  House Home Access: Stairs to enter Entrance Stairs-Rails: Left Home Layout: One level Home Equipment: Clinical cytogeneticist - 2 wheels(shower chair doesnt fit in shower)      Prior Function Level of Independence: Needs assistance  Gait / Transfers Assistance Needed: RW for functional mobility ADL's / Homemaking Assistance Needed: Wife assisted with LB ADLs     PT Goals (current goals can now be found in the care plan section) Acute Rehab PT Goals Patient Stated Goal: Stop pain PT Goal Formulation: With patient Time For Goal Achievement: 11/09/17 Potential to Achieve Goals: Good Progress towards PT goals: Progressing toward goals    Frequency    Min 3X/week      PT Plan Current plan remains appropriate    Co-evaluation              AM-PAC PT "6 Clicks" Daily Activity  Outcome Measure  Difficulty turning over in bed (including adjusting bedclothes, sheets and blankets)?: Unable Difficulty moving from lying on back to sitting on the side of the bed? : Unable Difficulty sitting down on and standing up from a chair with arms (e.g., wheelchair, bedside commode, etc,.)?: Unable Help needed moving to and from a bed to chair (including a wheelchair)?: A Lot Help needed walking in hospital room?: A Lot Help needed climbing 3-5 steps with a railing? : Total 6 Click Score: 8    End of Session Equipment Utilized During Treatment: Gait belt(L post op shoe) Activity Tolerance: Patient limited by lethargy;Patient limited by pain Patient left: in bed;with call bell/phone within reach;with family/visitor present Nurse Communication: Mobility status PT Visit Diagnosis: Unsteadiness on feet (R26.81);Pain Pain - Right/Left: Left Pain - part of body: Ankle and joints of foot     Time: 1425-1449 PT Time Calculation (min) (ACUTE ONLY): 24 min  Charges:  $Gait Training: 8-22 mins $Therapeutic Activity: 8-22 mins                    G Codes:       Alben Deeds, PT DPT  Board  Certified Neurologic Specialist [place] 10/27/2017, 4:24 PM

## 2017-10-28 LAB — BASIC METABOLIC PANEL
ANION GAP: 6 (ref 5–15)
BUN: 47 mg/dL — ABNORMAL HIGH (ref 6–20)
CO2: 22 mmol/L (ref 22–32)
Calcium: 8.1 mg/dL — ABNORMAL LOW (ref 8.9–10.3)
Chloride: 105 mmol/L (ref 101–111)
Creatinine, Ser: 2.27 mg/dL — ABNORMAL HIGH (ref 0.61–1.24)
GFR calc Af Amer: 30 mL/min — ABNORMAL LOW (ref >60)
GFR calc non Af Amer: 26 mL/min — ABNORMAL LOW (ref >60)
GLUCOSE: 130 mg/dL — AB (ref 65–99)
POTASSIUM: 4.8 mmol/L (ref 3.5–5.1)
Sodium: 133 mmol/L — ABNORMAL LOW (ref 135–145)

## 2017-10-28 LAB — CBC
HEMATOCRIT: 24 % — AB (ref 39.0–52.0)
Hemoglobin: 7.7 g/dL — ABNORMAL LOW (ref 13.0–17.0)
MCH: 30.7 pg (ref 26.0–34.0)
MCHC: 32.1 g/dL (ref 30.0–36.0)
MCV: 95.6 fL (ref 78.0–100.0)
PLATELETS: 161 10*3/uL (ref 150–400)
RBC: 2.51 MIL/uL — AB (ref 4.22–5.81)
RDW: 17.1 % — ABNORMAL HIGH (ref 11.5–15.5)
WBC: 10.5 10*3/uL (ref 4.0–10.5)

## 2017-10-28 LAB — GLUCOSE, CAPILLARY
GLUCOSE-CAPILLARY: 152 mg/dL — AB (ref 65–99)
GLUCOSE-CAPILLARY: 171 mg/dL — AB (ref 65–99)
Glucose-Capillary: 121 mg/dL — ABNORMAL HIGH (ref 65–99)

## 2017-10-28 LAB — PREPARE RBC (CROSSMATCH)

## 2017-10-28 MED ORDER — SODIUM CHLORIDE 0.9 % IV SOLN
INTRAVENOUS | Status: DC
  Administered 2017-10-28: 08:00:00 via INTRAVENOUS

## 2017-10-28 MED ORDER — FUROSEMIDE 10 MG/ML IJ SOLN
20.0000 mg | Freq: Once | INTRAMUSCULAR | Status: AC
  Administered 2017-10-28: 20 mg via INTRAVENOUS
  Filled 2017-10-28: qty 2

## 2017-10-28 MED ORDER — SODIUM CHLORIDE 0.9 % IV SOLN
Freq: Once | INTRAVENOUS | Status: AC
  Administered 2017-10-28: 11:00:00 via INTRAVENOUS

## 2017-10-28 NOTE — Progress Notes (Signed)
CSW spoke to patients daughter Maretta Bees) in the hall way. Abby stated that patients spouse [name]) is still looking at the SNF list given to her by CSW. Abby stated she would like patient to stay in the hospital over the weekend and hope he does not get discharge. CSW will continue to follow family for support and discharge  needs.  Please let CSW know 24/hr before discharge to pull authorization through patients insurance  Rhea Pink, MSW,  [place]

## 2017-10-28 NOTE — Consult Note (Signed)
[facility] CM Primary Care Navigator  10/28/2017  [name] Jan 01, 1941 [id number]   Met withpatient and wife [name]) at the bedside toidentify possible discharge needs. Patient has been sleeping most times during the visit.  Wifereportsthatpatient had "poor circulation to left leg/ soreness, swelling mostly to right leg and difficulty walking with foot drop " which all resulted to this admission/ surgery. (peripheral artery disease, status post left common femoral endarterectomy with profundoplasty, left femoral to below- knee popliteal bypass).  Patient's wifeendorsesDr.[name] with [facility] provider.   [facility] [place] [facility] [place].   Wifereports thatpatienthas beenmanaging hisown medications at [employer] use of "pill box" system filled weekly.  Patient's wife has been recently providingtransportationto his doctors'appointments.  Patient's wife is Archivist home. His daughter (Abby- Therapist, sports) and son Holiday representative) will be able to provide assistance with his care needs after discharge.  Anticipated plan for discharge isskilled nursing facility (SNF)for rehabilitationper therapy recommendation.  Patient's wife voiced understandingto callprimarycareprovider'soffice whenpatientreturns backhome,for a post discharge follow-upvisitwithin1- 2 weeksor sooner if needs arise.Patient letter (with PCP's contact number) was provided asareminder.   Explained topatient's wiferegarding [facility] CM services available for health management and resourcesat home. Wife plans to talk with primary care provider on patient's next visit about further needs in managing his health conditionswhen he gets back home. Patient's wifeverbalizedunderstandingto seekreferral from primary care provider to [facility] care management ifdeemed necessary  and appropriatefor anyservicesin the nearfuture,once patientreturnsbackhome.   [facility] care management information was provided for futureneeds thatpatientmay have.  Primary care provider's office is listed as providing transition of care (TOC) follow-up.    For additional questions please contact:  Edwena Felty A. Alizaya Oshea, BSN, RN-BC [facility] PRIMARY CARE Navigator Cell: [phone]

## 2017-10-28 NOTE — Progress Notes (Signed)
After the 1000 mL bolus was completed, the pt only had an output of 150 mL by 00:00. A bladder scan was done around 03:33 that showed only 48 mL in the bladder. Now he's had an output of 100 mL of urine.  Thinking it could be acute kidney injury.  Lupita Dawn, RN

## 2017-10-28 NOTE — Progress Notes (Signed)
BMET CBC reviewed Renal function about the same as yesterday.  Continue with plan for IV hydration Acute blood loss anemia transfuse No pharmacologic DVT prevention for at least 24-48 hours so we do not encourage ongoing bleeding  Ruta Hinds, MD [facility] [place] Office: [phone] Pager: [phone]

## 2017-10-28 NOTE — Progress Notes (Addendum)
Progress Note    10/28/2017 7:20 AM 3 Days Post-Op  Subjective:  Still confused.  Family says he didn't get up yesterday due to confusion.    Afebrile HR 50's-70's  956'L-875'I systolic 433% [id number]  Vitals:   10/27/17 2356 10/28/17 0328  BP: (!) 130/35 (!) 121/51  Pulse:  (!) 47  Resp: (!) 27 (!) 24  Temp: 98.3 F (36.8 C) 98.7 F (37.1 C)  SpO2: 99% 94%    Physical Exam: General:  No distress but confused. Lungs:  Non labored Incisions:  Pravena vac in left groin with good seal; other incisions are clean and dry with staples in tact; scant serous drainage from distal incision. Extremities:  Left leg is edematous; left foot is warm   CBC    Component Value Date/Time   WBC 11.4 (H) 10/27/2017 1545   RBC 2.75 (L) 10/27/2017 1545   HGB 8.5 (L) 10/27/2017 1545   HCT 25.2 (L) 10/27/2017 1545   PLT 160 10/27/2017 1545   MCV 91.6 10/27/2017 1545   MCH 30.9 10/27/2017 1545   MCHC 33.7 10/27/2017 1545   RDW 17.1 (H) 10/27/2017 1545   LYMPHSABS 1.0 03/09/2011 1418   MONOABS 0.5 03/09/2011 1418   EOSABS 0.2 03/09/2011 1418   BASOSABS 0.0 03/09/2011 1418    BMET    Component Value Date/Time   NA 130 (L) 10/27/2017 1545   K 5.2 (H) 10/27/2017 1545   CL 100 (L) 10/27/2017 1545   CO2 21 (L) 10/27/2017 1545   GLUCOSE 160 (H) 10/27/2017 1545   BUN 47 (H) 10/27/2017 1545   CREATININE 2.37 (H) 10/27/2017 1545   CREATININE 1.32 04/18/2012 0949   CALCIUM 8.2 (L) 10/27/2017 1545   GFRNONAA 25 (L) 10/27/2017 1545   GFRAA 29 (L) 10/27/2017 1545    INR    Component Value Date/Time   INR 1.13 10/21/2017 1233     Intake/Output Summary (Last 24 hours) at 10/28/2017 0720 Last data filed at 10/28/2017 0520 Gross per 24 hour  Intake 1150 ml  Output 250 ml  Net 900 ml     Assessment:  77 y.o. male is s/p:  Left common femoral endarterectomy with profundoplasty, left femoral to below-knee popliteal bypass with non-reversed left greater saphenous vein  3 Days  Post-Op  Plan: -pt's left foot is warm and edematous.  Wife states his toe looks better -AKI 2.37 this am, which is up from 1.76 and 1.68.  )Creatinine was 2.1 10/21/17).  Will stop his ACEI for now.   K+ 5.2 today-will need to monitor.  Pt is on lasix 40mg  prn swelling.  Also, with mild hyponatremia-may need some gentle hydration-will d/w Dr. Oneida Alar.  May need renal consult.   -check labs in am -DVT prophylaxis:  SQ heparin    Leontine Locket, PA-C [facility] [phone] 10/28/2017 7:20 AM  Acute worsening of chronic renal failure.  Most likely prerenal with poor PO intake Recheck stat BMET this morning.  Place Foley back if creatinine or potassium continue to rise.  Acute blood loss anemia.  Trend for now.  PAD s/p bypass patent graft incisions healing so far.  Still needs to work on ambulation.  Toe ulcer drying out.  Hyperkalemia recheck this morning  Hyponatremia start normal saline  Mild protein calorie malnutrition poor PO intake.  Check prealbumin.  Lethargy.  Stop IV pain meds.  Stop daytime neurontin dose.  Continue pm dose for now.  Ruta Hinds, MD [facility] [place] Office:  [phone] Pager: [phone]

## 2017-10-29 LAB — BPAM RBC
BLOOD PRODUCT EXPIRATION DATE: 201906252359
BLOOD PRODUCT EXPIRATION DATE: 201906252359
BLOOD PRODUCT EXPIRATION DATE: 201906282359
Blood Product Expiration Date: 201906102359
Blood Product Expiration Date: 201906252359
Blood Product Expiration Date: 201906292359
ISSUE DATE / TIME: 201906032240
ISSUE DATE / TIME: 201906040309
ISSUE DATE / TIME: 201906061150
ISSUE DATE / TIME: 201906031958
ISSUE DATE / TIME: 201906040159
ISSUE DATE / TIME: 201906061620
UNIT TYPE AND RH: 6200
UNIT TYPE AND RH: 6200
Unit Type and Rh: 6200
Unit Type and Rh: 6200
Unit Type and Rh: 6200
Unit Type and Rh: 6200

## 2017-10-29 LAB — BASIC METABOLIC PANEL
Anion gap: 7 (ref 5–15)
BUN: 45 mg/dL — AB (ref 6–20)
CALCIUM: 8.2 mg/dL — AB (ref 8.9–10.3)
CHLORIDE: 104 mmol/L (ref 101–111)
CO2: 22 mmol/L (ref 22–32)
CREATININE: 1.82 mg/dL — AB (ref 0.61–1.24)
GFR calc non Af Amer: 34 mL/min — ABNORMAL LOW (ref >60)
GFR, EST AFRICAN AMERICAN: 39 mL/min — AB (ref >60)
GLUCOSE: 140 mg/dL — AB (ref 65–99)
Potassium: 4.6 mmol/L (ref 3.5–5.1)
Sodium: 133 mmol/L — ABNORMAL LOW (ref 135–145)

## 2017-10-29 LAB — TYPE AND SCREEN
ABO/RH(D): A POS
ANTIBODY SCREEN: NEGATIVE
UNIT DIVISION: 0
UNIT DIVISION: 0
UNIT DIVISION: 0
UNIT DIVISION: 0
Unit division: 0
Unit division: 0

## 2017-10-29 LAB — CBC WITH DIFFERENTIAL/PLATELET
Abs Immature Granulocytes: 0.1 10*3/uL (ref 0.0–0.1)
Basophils Absolute: 0 10*3/uL (ref 0.0–0.1)
Basophils Relative: 0 %
EOS ABS: 0.1 10*3/uL (ref 0.0–0.7)
EOS PCT: 1 %
HEMATOCRIT: 28.3 % — AB (ref 39.0–52.0)
HEMOGLOBIN: 9.2 g/dL — AB (ref 13.0–17.0)
Immature Granulocytes: 1 %
LYMPHS ABS: 0.7 10*3/uL (ref 0.7–4.0)
LYMPHS PCT: 7 %
MCH: 30.1 pg (ref 26.0–34.0)
MCHC: 32.5 g/dL (ref 30.0–36.0)
MCV: 92.5 fL (ref 78.0–100.0)
MONO ABS: 0.8 10*3/uL (ref 0.1–1.0)
MONOS PCT: 9 %
Neutro Abs: 7.6 10*3/uL (ref 1.7–7.7)
Neutrophils Relative %: 82 %
Platelets: 162 10*3/uL (ref 150–400)
RBC: 3.06 MIL/uL — AB (ref 4.22–5.81)
RDW: 17.2 % — AB (ref 11.5–15.5)
WBC: 9.3 10*3/uL (ref 4.0–10.5)

## 2017-10-29 LAB — CBC
HEMATOCRIT: 28.8 % — AB (ref 39.0–52.0)
HEMOGLOBIN: 9.4 g/dL — AB (ref 13.0–17.0)
MCH: 29.7 pg (ref 26.0–34.0)
MCHC: 32.6 g/dL (ref 30.0–36.0)
MCV: 90.9 fL (ref 78.0–100.0)
Platelets: 156 10*3/uL (ref 150–400)
RBC: 3.17 MIL/uL — ABNORMAL LOW (ref 4.22–5.81)
RDW: 17.3 % — AB (ref 11.5–15.5)
WBC: 7.4 10*3/uL (ref 4.0–10.5)

## 2017-10-29 LAB — GLUCOSE, CAPILLARY
GLUCOSE-CAPILLARY: 147 mg/dL — AB (ref 65–99)
Glucose-Capillary: 139 mg/dL — ABNORMAL HIGH (ref 65–99)
Glucose-Capillary: 174 mg/dL — ABNORMAL HIGH (ref 65–99)
Glucose-Capillary: 180 mg/dL — ABNORMAL HIGH (ref 65–99)

## 2017-10-29 LAB — PREALBUMIN: Prealbumin: 9 mg/dL — ABNORMAL LOW (ref 18–38)

## 2017-10-29 MED ORDER — GLUCERNA SHAKE PO LIQD
237.0000 mL | Freq: Three times a day (TID) | ORAL | Status: DC
  Administered 2017-10-30 – 2017-11-02 (×10): 237 mL via ORAL

## 2017-10-29 NOTE — Progress Notes (Addendum)
Progress Note    10/29/2017 7:15 AM 4 Days Post-Op  Subjective:  More awake this morning  Tm 99.3 now afebrile HR 60's-70's 741'O-878'M systolic 76% [id number]  Vitals:   10/29/17 0029 10/29/17 0420  BP: (!) 170/49 (!) 135/43  Pulse: 67 63  Resp: 18 20  Temp: 98.2 F (36.8 C) 97.7 F (36.5 C)  SpO2: 100% 99%    Physical Exam: General:  More awake this morning; no distress Lungs:  Non labored Incisions:  Dry bandages in place; pravena with good seal Extremities:  Left foot is warm; left great toe is unchanged from yesterday; less swelling in the LLE today   CBC    Component Value Date/Time   WBC 9.3 10/28/2017 2219   RBC 3.06 (L) 10/28/2017 2219   HGB 9.2 (L) 10/28/2017 2219   HCT 28.3 (L) 10/28/2017 2219   PLT 162 10/28/2017 2219   MCV 92.5 10/28/2017 2219   MCH 30.1 10/28/2017 2219   MCHC 32.5 10/28/2017 2219   RDW 17.2 (H) 10/28/2017 2219   LYMPHSABS 0.7 10/28/2017 2219   MONOABS 0.8 10/28/2017 2219   EOSABS 0.1 10/28/2017 2219   BASOSABS 0.0 10/28/2017 2219    BMET    Component Value Date/Time   NA 133 (L) 10/28/2017 0809   K 4.8 10/28/2017 0809   CL 105 10/28/2017 0809   CO2 22 10/28/2017 0809   GLUCOSE 130 (H) 10/28/2017 0809   BUN 47 (H) 10/28/2017 0809   CREATININE 2.27 (H) 10/28/2017 0809   CREATININE 1.32 04/18/2012 0949   CALCIUM 8.1 (L) 10/28/2017 0809   GFRNONAA 26 (L) 10/28/2017 0809   GFRAA 30 (L) 10/28/2017 0809    INR    Component Value Date/Time   INR 1.13 10/21/2017 1233     Intake/Output Summary (Last 24 hours) at 10/29/2017 0715 Last data filed at 10/29/2017 0454 Gross per 24 hour  Intake 745 ml  Output 2750 ml  Net -2005 ml     Assessment:  77 y.o. male is s/p:  Left common femoral endarterectomy with profundoplasty, left femoral to below-knee popliteal bypass with non-reversed left greater saphenous vein   4 Days Post-Op   Plan: -pt more awake this am -AKI--creatinine was 2.27 down a little bit from prior day.  He  had good UOP the last 24hrs.  Labs are ordered for today but not done.   -left foot is warm and appears a little less swollen today -DVT prophylaxis:  Pharmacologic DVT prophylaxis on hold at this time for 24-48 hrs given his recent blood loss and transfusion; hgb improved to 9.2 after 2 units PRBC's -needs to be out of bed and working with PT -pt will need to go to SNF and given his renal function -    Leontine Locket, PA-C [facility] [phone] 10/29/2017 7:15 AM   Agree with above Fem pop patent.  Dry gangrene toe healing Hyponatremia improve, renal function improve continue IV fluid one more day Severe protein calorie malnutrition start glucerna shakes.  PO intake has been better this morning SCDs for DVT prophylaxis Still minimal ambulation if no real improvement over weekend will plan for SNF Monday Family updated at bedside  Ruta Hinds, MD [facility] [place] Office: [phone] Pager: [phone]  CBC    Component Value Date/Time   WBC 7.4 10/29/2017 0627   RBC 3.17 (L) 10/29/2017 0627   HGB 9.4 (L) 10/29/2017 0627   HCT 28.8 (L) 10/29/2017 0627   PLT 156 10/29/2017  0627   MCV 90.9 10/29/2017 0627   MCH 29.7 10/29/2017 0627   MCHC 32.6 10/29/2017 0627   RDW 17.3 (H) 10/29/2017 0627   LYMPHSABS 0.7 10/28/2017 2219   MONOABS 0.8 10/28/2017 2219   EOSABS 0.1 10/28/2017 2219   BASOSABS 0.0 10/28/2017 2219    BMET    Component Value Date/Time   NA 133 (L) 10/29/2017 0627   K 4.6 10/29/2017 0627   CL 104 10/29/2017 0627   CO2 22 10/29/2017 0627   GLUCOSE 140 (H) 10/29/2017 0627   BUN 45 (H) 10/29/2017 0627   CREATININE 1.82 (H) 10/29/2017 0627   CREATININE 1.32 04/18/2012 0949   CALCIUM 8.2 (L) 10/29/2017 0627   GFRNONAA 34 (L) 10/29/2017 0627   GFRAA 39 (L) 10/29/2017 5844

## 2017-10-29 NOTE — Progress Notes (Signed)
Physical Therapy Treatment Patient Details Name: [name] MRN: [id number] DOB: [birth year] Today's Date: 10/29/2017    History of Present Illness [name] s/p left bypass graft femoral below knee popliteal artery on 10/25/17. PMH including PVD, HTN, DM, peripheral neuropathy, CABG (2013), and bilateral rotator cuff repairs.     PT Comments    Pt making slow progress with mobility. Continue to recommend ST-SNF for further rehab.    Follow Up Recommendations  SNF;Supervision/Assistance - 24 hour     Equipment Recommendations  Rolling walker with 5" wheels    Recommendations for Other Services       Precautions / Restrictions Precautions Precautions: Fall Restrictions Weight Bearing Restrictions: Yes LLE Weight Bearing: Weight bearing as tolerated Other Position/Activity Restrictions: L post op shoe to protect necrotic great toe    Mobility  Bed Mobility               General bed mobility comments: Pt up in chair  Transfers Overall transfer level: Needs assistance Equipment used: Rolling walker (2 wheeled) Transfers: Sit to/from Stand Sit to Stand: +2 safety/equipment;Mod assist         General transfer comment: Assist to bring hips up and to maintain balance especially when transitioning hands from armrest to walker.  Ambulation/Gait Ambulation/Gait assistance: Mod assist;+2 safety/equipment Ambulation Distance (Feet): 10 Feet Assistive device: Rolling walker (2 wheeled) Gait Pattern/deviations: Step-to pattern;Decreased stride length;Decreased dorsiflexion - left;Decreased weight shift to right;Wide base of support;Trunk flexed Gait velocity: decreased Gait velocity interpretation: <1.31 ft/sec, indicative of household ambulator General Gait Details: Assist for balance and support as well as assist to guide and move LLE. Verbal cues to stand more erect and not to step too far forward with LLE   Stairs             Wheelchair Mobility     Modified Rankin (Stroke Patients Only)       Balance Overall balance assessment: Needs assistance Sitting-balance support: Feet unsupported;No upper extremity supported Sitting balance-Leahy Scale: Fair     Standing balance support: Bilateral upper extremity supported Standing balance-Leahy Scale: Poor Standing balance comment: walker and min assist for static standing                            Cognition Arousal/Alertness: Awake/alert Behavior During Therapy: Anxious Overall Cognitive Status: Within Functional Limits for tasks assessed                               Problem Solving: Requires verbal cues;Requires tactile cues        Exercises      General Comments        Pertinent Vitals/Pain Pain Assessment: Faces Faces Pain Scale: Hurts whole lot Pain Location: LLE Pain Descriptors / Indicators: Grimacing;Moaning;Guarding;Constant Pain Intervention(s): Limited activity within patient's tolerance;Monitored during session;Repositioned    Home Living                      Prior Function            PT Goals (current goals can now be found in the care plan section) Progress towards PT goals: Progressing toward goals    Frequency    Min 3X/week      PT Plan Current plan remains appropriate    Co-evaluation              AM-PAC PT "  6 Clicks" Daily Activity  Outcome Measure  Difficulty turning over in bed (including adjusting bedclothes, sheets and blankets)?: Unable Difficulty moving from lying on back to sitting on the side of the bed? : Unable Difficulty sitting down on and standing up from a chair with arms (e.g., wheelchair, bedside commode, etc,.)?: Unable Help needed moving to and from a bed to chair (including a wheelchair)?: A Lot Help needed walking in hospital room?: A Lot Help needed climbing 3-5 steps with a railing? : Total 6 Click Score: 8    End of Session Equipment Utilized During Treatment:  Gait belt Activity Tolerance: Patient limited by pain;Patient limited by fatigue Patient left: with call bell/phone within reach;with family/visitor present;in chair Nurse Communication: Mobility status PT Visit Diagnosis: Unsteadiness on feet (R26.81);Pain Pain - Right/Left: Left Pain - part of body: Ankle and joints of foot     Time: 2811-8867 PT Time Calculation (min) (ACUTE ONLY): 20 min  Charges:  $Gait Training: 8-22 mins                    G Codes:       [facility] PT [place] 10/29/2017, 12:24 PM

## 2017-10-29 NOTE — Plan of Care (Signed)
  Problem: Education: Goal: Knowledge of General Education information will improve Outcome: Progressing   Problem: Pain Managment: Goal: General experience of comfort will improve Outcome: Progressing   Problem: Safety: Goal: Ability to remain free from injury will improve Outcome: Progressing   Problem: Skin Integrity: Goal: Risk for impaired skin integrity will decrease Outcome: Progressing

## 2017-10-29 NOTE — Plan of Care (Signed)
  Problem: Education: Goal: Knowledge of General Education information will improve Outcome: Progressing   

## 2017-10-29 NOTE — Care Management Note (Signed)
Case Management Note Marvetta Gibbons RN,BSN [facility] 1-22 Case Manager  [phone]  Patient Details  Name: [name] MRN: [id number] Date of Birth: 06-Mar-1941  Subjective/Objective:    Pt admitted s/p Left common femoral endarterectomy with profundoplasty, left femoral to below-knee popliteal bypass                 Action/Plan: PTA pt lived at home with spouse- PT/OT evals pending- may need STSNF- CSW consulted for possible placement needs- CM will follow  Expected Discharge Date:                  Expected Discharge Plan:  Skilled Nursing Facility  In-House Referral:  Clinical Social Work  Discharge planning Services  CM Consult  Post Acute Care Choice:    Choice offered to:     DME Arranged:    DME Agency:     HH Arranged:    [place] Agency:     Status of Service:  In process, will continue to follow  If discussed at Long Length of Stay Meetings, dates discussed:    Discharge Disposition:   Additional Comments:  10/29/17- 1220- Marvetta Gibbons RN, CM- per PT/OT evals recommendations for SNF, CSW to f/u for STSNF placement needs- pt has prevena wound VAC to groin. D/c planning for Monday 6/10.    Dawayne Patricia, RN 10/29/2017, 12:21 PM

## 2017-10-29 NOTE — Care Management Important Message (Signed)
Important Message  Patient Details  Name: [name] MRN: [id number] Date of Birth: 1940-12-28   Medicare Important Message Given:  Yes    Barb Merino Kiersten Coss 10/29/2017, 3:32 PM

## 2017-10-30 LAB — BASIC METABOLIC PANEL
ANION GAP: 6 (ref 5–15)
BUN: 41 mg/dL — ABNORMAL HIGH (ref 6–20)
CO2: 23 mmol/L (ref 22–32)
Calcium: 8.3 mg/dL — ABNORMAL LOW (ref 8.9–10.3)
Chloride: 104 mmol/L (ref 101–111)
Creatinine, Ser: 1.48 mg/dL — ABNORMAL HIGH (ref 0.61–1.24)
GFR, EST AFRICAN AMERICAN: 50 mL/min — AB (ref >60)
GFR, EST NON AFRICAN AMERICAN: 44 mL/min — AB (ref >60)
GLUCOSE: 137 mg/dL — AB (ref 65–99)
POTASSIUM: 5.1 mmol/L (ref 3.5–5.1)
SODIUM: 133 mmol/L — AB (ref 135–145)

## 2017-10-30 LAB — CBC
HEMATOCRIT: 29.2 % — AB (ref 39.0–52.0)
HEMOGLOBIN: 9.4 g/dL — AB (ref 13.0–17.0)
MCH: 29.3 pg (ref 26.0–34.0)
MCHC: 32.2 g/dL (ref 30.0–36.0)
MCV: 91 fL (ref 78.0–100.0)
Platelets: 193 10*3/uL (ref 150–400)
RBC: 3.21 MIL/uL — AB (ref 4.22–5.81)
RDW: 17.1 % — ABNORMAL HIGH (ref 11.5–15.5)
WBC: 5.7 10*3/uL (ref 4.0–10.5)

## 2017-10-30 LAB — GLUCOSE, CAPILLARY: Glucose-Capillary: 133 mg/dL — ABNORMAL HIGH (ref 65–99)

## 2017-10-30 MED ORDER — HYDROCODONE-ACETAMINOPHEN 5-325 MG PO TABS
1.0000 | ORAL_TABLET | Freq: Four times a day (QID) | ORAL | Status: DC | PRN
  Administered 2017-10-30 – 2017-11-02 (×8): 2 via ORAL
  Filled 2017-10-30 (×8): qty 2

## 2017-10-30 NOTE — Progress Notes (Addendum)
  Progress Note    10/30/2017 9:01 AM 5 Days Post-Op  Subjective:  Wife states patient is in and out of delirium   Vitals:   10/29/17 2344 10/30/17 0436  BP: (!) 134/57 (!) 153/55  Pulse: 72 71  Resp: (!) 29 (!) 26  Temp: 99.4 F (37.4 C) 99.4 F (37.4 C)  SpO2: 93% 93%   Physical Exam: Lungs:  Non labored on [place] Incisions:  L groin incisional vac with good seal; serous collection on dressing of LLE incisions; minimal drainage from distal most incision Extremities:  Palpable ATA Abdomen:  Soft Neurologic: baseline  CBC    Component Value Date/Time   WBC 5.7 10/30/2017 0729   RBC 3.21 (L) 10/30/2017 0729   HGB 9.4 (L) 10/30/2017 0729   HCT 29.2 (L) 10/30/2017 0729   PLT 193 10/30/2017 0729   MCV 91.0 10/30/2017 0729   MCH 29.3 10/30/2017 0729   MCHC 32.2 10/30/2017 0729   RDW 17.1 (H) 10/30/2017 0729   LYMPHSABS 0.7 10/28/2017 2219   MONOABS 0.8 10/28/2017 2219   EOSABS 0.1 10/28/2017 2219   BASOSABS 0.0 10/28/2017 2219    BMET    Component Value Date/Time   NA 133 (L) 10/30/2017 0729   K 5.1 10/30/2017 0729   CL 104 10/30/2017 0729   CO2 23 10/30/2017 0729   GLUCOSE 137 (H) 10/30/2017 0729   BUN 41 (H) 10/30/2017 0729   CREATININE 1.48 (H) 10/30/2017 0729   CREATININE 1.32 04/18/2012 0949   CALCIUM 8.3 (L) 10/30/2017 0729   GFRNONAA 44 (L) 10/30/2017 0729   GFRAA 50 (L) 10/30/2017 0729    INR    Component Value Date/Time   INR 1.13 10/21/2017 1233     Intake/Output Summary (Last 24 hours) at 10/30/2017 0901 Last data filed at 10/30/2017 0437 Gross per 24 hour  Intake 347 ml  Output 800 ml  Net -453 ml     Assessment/Plan:  77 y.o. male is s/p Left common femoral endarterectomy with profundoplasty, left femoral to below-knee popliteal bypass with non-reversed left greater saphenous vein    5 Days Post-Op   Patent bypass with palpable L ATA L GT demarcating well; no surgical intervention indicated at this time AKI- Creatinine continues to  improve; 375ml urine overnight ABL anemia: maintaining Hgb; 9.4 this morning Encouraged OOB and participation with therapy teams and nursing staff Dispo: pending improvement with mobility  Dagoberto Ligas, PA-C [facility] [phone] 10/30/2017 9:01 AM  Agree with above.  Patent bypass.  Some serous drainage from medial thigh vein harvest sites otherwise looks ok.  Renal function improved.  Saline lock IV Acute blood loss anemia stable continue SCDS rather than pharmacologic DVT proph  Still needs to ambulate more.    Most likely SNF Monday unless dramatic improvement over the weekend.  Ruta Hinds, MD [facility] [place] Office: [phone] Pager: [phone]

## 2017-10-30 NOTE — Social Work (Signed)
CSW spoke with pt wife [name], pt wife states that she is only interested in [facility] (they have not currently offered bed in [facility]). CSW will follow up with [facility] and CSW will f/u with pt family.  CSW continuing to follow.  Alexander Mt, [place] Work [phone]

## 2017-10-31 ENCOUNTER — Inpatient Hospital Stay (HOSPITAL_COMMUNITY): Payer: Medicare HMO

## 2017-10-31 DIAGNOSIS — R609 Edema, unspecified: Secondary | ICD-10-CM

## 2017-10-31 NOTE — Progress Notes (Signed)
VASCULAR LAB PRELIMINARY  PRELIMINARY  PRELIMINARY  PRELIMINARY  Left upper extremity venous duplex completed.    Preliminary report:  There is no DVT noted in the left upper extremity.  There is superficial thrombosis noted in the left cephalic from just above the IV site to just below the [facility].  Interstitial fluid noted throughout the left upper extremity.   Cady Hafen, RVT 10/31/2017, 3:04 PM

## 2017-10-31 NOTE — Progress Notes (Addendum)
  Progress Note    10/31/2017 8:46 AM 6 Days Post-Op  Subjective:  No complaints today; Per nurse patient required lift and 2 person assist to chair yesterday   Vitals:   10/31/17 0008 10/31/17 0516  BP: (!) 149/55 (!) 166/51  Pulse: 64 71  Resp: 14 16  Temp: 98.8 F (37.1 C) 98.4 F (36.9 C)  SpO2: 92% 96%   Physical Exam: Lungs:  Non labored on [place] Incisions:  L groin with incisional vac in place; LLE incisions with new dry dressings in place, without serous collection currently Extremities:  Palpable L ATA; edematous L arm Abdomen:  Soft Neurologic: baseline  CBC    Component Value Date/Time   WBC 5.7 10/30/2017 0729   RBC 3.21 (L) 10/30/2017 0729   HGB 9.4 (L) 10/30/2017 0729   HCT 29.2 (L) 10/30/2017 0729   PLT 193 10/30/2017 0729   MCV 91.0 10/30/2017 0729   MCH 29.3 10/30/2017 0729   MCHC 32.2 10/30/2017 0729   RDW 17.1 (H) 10/30/2017 0729   LYMPHSABS 0.7 10/28/2017 2219   MONOABS 0.8 10/28/2017 2219   EOSABS 0.1 10/28/2017 2219   BASOSABS 0.0 10/28/2017 2219    BMET    Component Value Date/Time   NA 133 (L) 10/30/2017 0729   K 5.1 10/30/2017 0729   CL 104 10/30/2017 0729   CO2 23 10/30/2017 0729   GLUCOSE 137 (H) 10/30/2017 0729   BUN 41 (H) 10/30/2017 0729   CREATININE 1.48 (H) 10/30/2017 0729   CREATININE 1.32 04/18/2012 0949   CALCIUM 8.3 (L) 10/30/2017 0729   GFRNONAA 44 (L) 10/30/2017 0729   GFRAA 50 (L) 10/30/2017 0729    INR    Component Value Date/Time   INR 1.13 10/21/2017 1233     Intake/Output Summary (Last 24 hours) at 10/31/2017 0846 Last data filed at 10/31/2017 0526 Gross per 24 hour  Intake 360 ml  Output 1900 ml  Net -1540 ml     Assessment/Plan:  77 y.o. male is s/p Left common femoral endarterectomy with profundoplasty, left femoral to below-knee popliteal bypass with non-reversed left greater saphenous vein   6 Days Post-Op   Patent bypass with palpable L ATA AKI resolving; will leave condom cath until patient a  little more mobile LUE edematous; check venous duplex No surgical intervention indicated for L GT currently CSW consulted for SNF early this week  Dagoberto Ligas, PA-C [facility] [phone] 10/31/2017 8:46 AM  Agree with above.   Most likely SNF [facility] per wife tomorrow if bed available Will d/c [facility] tomorrow  Ruta Hinds, MD [facility] [place] Office: [phone] Pager: [phone]

## 2017-10-31 NOTE — Progress Notes (Signed)
Physical Therapy Treatment Patient Details Name: [name] MRN: [id number] DOB: 02-May-1941 Today's Date: 10/31/2017    History of Present Illness [name] s/p left bypass graft femoral below knee popliteal artery on 10/25/17. PMH including PVD, HTN, DM, peripheral neuropathy, CABG (2013), and bilateral rotator cuff repairs.     PT Comments    Pt progressing slowly towards physical therapy goals. Focus of session was to initiate gait training. Pt was able to stand EOB and take a few side steps up towards [place]. He was able to complete with +2 assist for balance support, walker management, and occasional LLE advancement.  Saw pt today at the request of RN, as pt's family upset he was not seen by therapy yesterday (Saturday). Explained to the pt and family that PT frequency was set at 3x/week and that PT typically will see the patient on an every other day basis on the weekdays only (not on the weekend). Also explained that days of the week therapy will be seeing the patient are subject to change. At the end of the session I set the expectation that PT would be back on Tuesday to attempt another session.   Follow Up Recommendations  SNF;Supervision/Assistance - 24 hour     Equipment Recommendations  Rolling walker with 5" wheels    Recommendations for Other Services       Precautions / Restrictions Precautions Precautions: Fall Restrictions Weight Bearing Restrictions: No LLE Weight Bearing: Weight bearing as tolerated Other Position/Activity Restrictions: L post op shoe to protect necrotic great toe    Mobility  Bed Mobility Overal bed mobility: Needs Assistance Bed Mobility: Supine to Sit;Sit to Supine     Supine to sit: Mod assist;+2 for physical assistance Sit to supine: Max assist;+2 for physical assistance   General bed mobility comments: Pt required increased time and assist for LE movement towards EOB as well as trunk elevation to full sitting position. Bed pad  used to assist with scooting hips. Max assist provided for return to supine.   Transfers Overall transfer level: Needs assistance Equipment used: Rolling walker (2 wheeled) Transfers: Sit to/from Stand Sit to Stand: +2 safety/equipment;Mod assist         General transfer comment: Assist required to power-up to full standing position. Pt required increased time and assist for foot placement on the floor.   Ambulation/Gait Ambulation/Gait assistance: Mod assist;+2 safety/equipment Ambulation Distance (Feet): 2 Feet Assistive device: Rolling walker (2 wheeled) Gait Pattern/deviations: Step-to pattern;Decreased stride length;Decreased dorsiflexion - left;Decreased weight shift to right;Wide base of support;Trunk flexed Gait velocity: decreased Gait velocity interpretation: <1.8 ft/sec, indicate of risk for recurrent falls General Gait Details: VC's for sequencing and general safety. Pt required assist for balance, walker management, and occasional L foot advancement.    Stairs             Wheelchair Mobility    Modified Rankin (Stroke Patients Only)       Balance Overall balance assessment: Needs assistance Sitting-balance support: Feet unsupported;No upper extremity supported Sitting balance-Leahy Scale: Fair Sitting balance - Comments: Able to maintain static sitting at EOB   Standing balance support: Bilateral upper extremity supported Standing balance-Leahy Scale: Poor Standing balance comment: Reliant on UE support for balance.                             Cognition Arousal/Alertness: Awake/alert Behavior During Therapy: Anxious Overall Cognitive Status: Within Functional Limits for tasks assessed  Exercises General Exercises - Lower Extremity Quad Sets: 5 reps Long Arc Quad: 5 reps    General Comments        Pertinent Vitals/Pain Pain Assessment: Faces Faces Pain Scale: Hurts whole  lot Pain Location: LLE Pain Descriptors / Indicators: Grimacing;Moaning;Guarding;Constant Pain Intervention(s): Limited activity within patient's tolerance;Monitored during session;Repositioned    Home Living                      Prior Function            PT Goals (current goals can now be found in the care plan section) Acute Rehab PT Goals Patient Stated Goal: Decrease pain PT Goal Formulation: With patient Time For Goal Achievement: 11/09/17 Potential to Achieve Goals: Good Progress towards PT goals: Progressing toward goals    Frequency    Min 3X/week      PT Plan Current plan remains appropriate    Co-evaluation              AM-PAC PT "6 Clicks" Daily Activity  Outcome Measure  Difficulty turning over in bed (including adjusting bedclothes, sheets and blankets)?: Unable Difficulty moving from lying on back to sitting on the side of the bed? : Unable Difficulty sitting down on and standing up from a chair with arms (e.g., wheelchair, bedside commode, etc,.)?: Unable Help needed moving to and from a bed to chair (including a wheelchair)?: A Lot Help needed walking in hospital room?: A Lot Help needed climbing 3-5 steps with a railing? : Total 6 Click Score: 8    End of Session Equipment Utilized During Treatment: Gait belt Activity Tolerance: Patient limited by pain;Patient limited by fatigue Patient left: with call bell/phone within reach;with family/visitor present;in chair Nurse Communication: Mobility status PT Visit Diagnosis: Unsteadiness on feet (R26.81);Pain Pain - Right/Left: Left Pain - part of body: Ankle and joints of foot     Time: 6015-6153 PT Time Calculation (min) (ACUTE ONLY): 34 min  Charges:  $Gait Training: 23-37 mins                    G Codes:       Rolinda Roan, PT, DPT Acute Rehabilitation Services Pager: [phone]    Thelma Comp 10/31/2017, 2:09 PM

## 2017-11-01 ENCOUNTER — Telehealth: Payer: Self-pay | Admitting: Vascular Surgery

## 2017-11-01 MED ORDER — HYDROCODONE-ACETAMINOPHEN 5-325 MG PO TABS: 1.0000 | ORAL_TABLET | Freq: Four times a day (QID) | ORAL | 0 refills | Status: DC | PRN

## 2017-11-01 MED ORDER — BISACODYL 10 MG RE SUPP
10.0000 mg | Freq: Every day | RECTAL | Status: DC | PRN
  Administered 2017-11-02: 10 mg via RECTAL
  Filled 2017-11-01: qty 1

## 2017-11-01 MED ORDER — RAMIPRIL 10 MG PO CAPS
10.0000 mg | ORAL_CAPSULE | Freq: Every day | ORAL | Status: DC
  Administered 2017-11-01 – 2017-11-02 (×2): 10 mg via ORAL
  Filled 2017-11-01 (×3): qty 1

## 2017-11-01 MED ORDER — GLUCERNA SHAKE PO LIQD: 237.0000 mL | Freq: Three times a day (TID) | ORAL | Status: DC

## 2017-11-01 MED ORDER — CETIRIZINE HCL 10 MG PO TABS: 10.0000 mg | ORAL_TABLET | Freq: Every day | ORAL | Status: DC | PRN

## 2017-11-01 NOTE — Discharge Summary (Addendum)
Discharge Summary     DONSHAY [name] 1941/04/16 77 y.o. male  [id number]  Admission Date: 10/25/2017  Discharge Date: 11/02/17  Physician: Elam Dutch, MD  Admission Diagnosis: claudication   HPI:   This is a 77 y.o. male who has knownperipheral vascular disease and carotid disease.  He is s/p right CEA and aorta-innominate graft on 09/09/11 and left CEA 04/08/11,aortobifemoral bypass grafting (2007).  The patient denies symptoms of TIA, amaurosis, or stroke.  He does continue to have balance and weakness/numbness with ambulation and weakness/numbness in his legs. He stops to rest and he can go again.  Other medical problems include DM, HTN, Hypercholesterolemia and CAD. His health has been stable with out change  Pt was last evaluated by Dr. Oneida Alar and Jerilynn Mages. [employer] on 02-18-17. At that timepatient's carotid occlusive diseasewas stableandasymptomatic. He hadno significant carotid stenosis. He haddecreased ABIs bilaterally but really hadonly minimal symptoms from this. He hadsome leg weakness but some of this also seems like neuropathy. He didnot have any nonhealing wounds. Dr. Migdalia Dk only consider a revascularization procedure on him if he had nonhealing wounds and was at risk of limb loss due to the fact that he wouldrequire redo operation which would be high risk due to his obesity. Fortunately his symptomswere minimal at thatpoint. Patient was tofollow-up in one year with repeat ABIs and carotid duplex scan. He was toreturn sooner if he develops any wounds on his feet or has any symptoms of TIA amaurosis or stroke.  Pt states he needs a partial knee replacement, right first, then left. Cortisone shot in his knee did not help.  He is seeing Dr. Mardelle Matte.  Wife states pt falls a lot, in [place] fell and fractured a bone in his left 5th metatarsal.  Pt states he was evaluated by a chiropractor, and states she told him that "no wonder  he is falling, his brain has no idea where his feet are".   Pt returns today at the request of Dr. Sharol Given. Dr. Jess Barters note on 10-04-17 includes the following:  [Examination I cannot palpate a dorsalis pedis or posterior tibial pulse on the left. Patient has brawny skin color changes in both legs with pitting edema up to the tibial tubercle. There is no ulcers no drainage. Patient has an ischemic ulcer that is approximately 2 cm in diameter over the tip of the left great toe patient states that this occurred from rubbing in his shoe wear. Patient's toes are cold to the touch there extremely tender to light palpation and are blue in color. The Doppler that I used today I could not doppler a pulse possibly due to the swelling.]  Pt and his wife state that about 3 weeks ago he noted "blood blister" at the tip of his left great toe, he thinks his shoe was rubbing there, pt states that wife thinks he injured his left foot a few times.   He states that he has a fractured left 3rd toe, found on x-ray by Dr. Mardelle Matte on 09-29-16, Pt then saw Dr. Sharol Given on 10-04-17.   He has seen neurology, Dr. Deliah Boston, pt states she prescribed him a prosthesis on the left for foot drop; he wore it and it hurt too much.    His last serum creatinine result on file was 2.10 on 08-11-17.   Pt takes gabapentin. He states his PCP prescribed him some hydrocodone when he saw him recently, to help with pain that has kept him awake at night since  he wore the left foot prosthsis on May 2, states that is when the worst pain started  He has chronic venous insufficiency with swelling in his lower legs.  Hospital Course:  The patient was admitted to the hospital and taken to the operating room on 10/25/2017 and underwent: Left common femoral endarterectomy with profundoplasty, left femoral to below-knee popliteal bypass with non-reversed left greater saphenous vein  Operative findings: #1 severe calcific disease right common  femoral artery #2 good quality saphenous vein 3 to 5 mm  The pt tolerated the procedure well and was transported to the PACU in good condition.   In the recovery room, pt was confused.  He has had several episodes of post op delirium in the past.  He did have acute blood loss anemia and was transfused.    By POD 1, he was oliguric and transferred to [place].  He was started on a carb modified diet.  He also had acute surgical blood loss anemia and was transfused.    ABI's post op were: ABI's have been completed. Right 0.58 Left 0.57  On POD 2, his foley was removed.  Left groin with Pravena vac with good seal.  Bypass is patent.  By POD 3, his foot remained edematous and left great toe was looking better.  He did have AKI of 2.37 on 10/27/17, which is up from 1.76 and 1.68.  Her ACEI was discontinued.  He also had hyponatremia and was hydrated.  This also improved.  Her BMET from that day revealed her renal function was about the same.  He was transfused again and pharmacologic DVT prophylaxis was held as to not encourage ongoing bleeding.    On POD 4, he was more awake, his creatinine was down to 2.27 and had good UOP.  His left foot was a little less swollen. His hgb improved after two units of PRBC's.    On POD 5, his AKI continues to improve.  His IV was saline locked.  SCD's were continued rather than pharmacologic DVT prophylaxis.    On POD 6, he did have some swelling of the LUE and a venous duplex was obtained and negative for DVT.    On POD 7, he was doing well with a little confusion.  He had a palpable left DP pulse.  Incisions are tenuous but healing so far.  Will need dry gauze to left groin to wick moisture.    On POD 8, he did have some drainage from the proximal portion of the left groin incision.  Dry gauze to left groin.  Will need meticulous wound care for left groin.  CBC    Component Value Date/Time   WBC 5.7 10/30/2017 0729   RBC 3.21 (L) 10/30/2017 0729   HGB 9.4  (L) 10/30/2017 0729   HCT 29.2 (L) 10/30/2017 0729   PLT 193 10/30/2017 0729   MCV 91.0 10/30/2017 0729   MCH 29.3 10/30/2017 0729   MCHC 32.2 10/30/2017 0729   RDW 17.1 (H) 10/30/2017 0729   LYMPHSABS 0.7 10/28/2017 2219   MONOABS 0.8 10/28/2017 2219   EOSABS 0.1 10/28/2017 2219   BASOSABS 0.0 10/28/2017 2219    BMET    Component Value Date/Time   NA 133 (L) 10/30/2017 0729   K 5.1 10/30/2017 0729   CL 104 10/30/2017 0729   CO2 23 10/30/2017 0729   GLUCOSE 137 (H) 10/30/2017 0729   BUN 41 (H) 10/30/2017 0729   CREATININE 1.48 (H) 10/30/2017 7371  CREATININE 1.32 04/18/2012 0949   CALCIUM 8.3 (L) 10/30/2017 0729   GFRNONAA 44 (L) 10/30/2017 0729   GFRAA 50 (L) 10/30/2017 0729       Discharge Diagnosis:  claudication  Secondary Diagnosis: Patient Active Problem List   Diagnosis Date Noted  . PAD (peripheral artery disease) ([place]) 10/25/2017  . Polyneuropathy associated with underlying disease ([place]) 08/16/2017  . Essential hypertension 02/06/2014  . Chronic diastolic heart failure ([place]) 02/06/2014  . Chronic kidney disease, stage III (moderate) ([place]) 02/06/2014  . Aftercare following surgery of the circulatory system, [place] 01/25/2014  . Carotid stenosis 06/08/2013  . Stricture of artery ([place]) 04/14/2012  . Peripheral vascular disease, unspecified ([place]) 10/08/2011  . DVT of upper extremity (deep vein thrombosis), left 09/29/2011  . S/P CABG (coronary artery bypass graft) 09/29/2011  . H/O carotid endarterectomy 09/29/2011  . Occlusion and stenosis of carotid artery without mention of cerebral infarction 06/11/2011  . Diabetes mellitus type 2 with complications ([place]) 00/[id number]/8182   Past Medical History:  Diagnosis Date  . Arthritis   . Atherosclerotic heart disease   . Back pain   . Bruises easily    takes Pletal daily and ASA 325mg  daily  . Cataract immature    right  . Chest pain   . Colon polyps   . Complication of anesthesia    hard to wake up with bypass  surgery  . Cyst    on perineum;takes Doxycycline daily  . Diabetes mellitus    takes Glipizide,Metformin,and Actos daily  . Diarrhea   . Dyspnea    when lying flat  . Edema    right leg knee down swollen since fall 3 wks ago  . Foot drop, left   . GERD (gastroesophageal reflux disease)    Rolaids as needed-occ reflux  . Hemorrhoids   . History of kidney stones    at age 19   . Hyperlipidemia    takes Tricor and Lipitor daily  . Hyperlipidemia   . Hypertension    takes Altace,Amlodipine,and Nadolol daily  . Hypothyroidism   . Kidney stone    35+yrs ago  . Muscle pain   . Nasal polyps    hx of  . Peripheral edema    wears knee length hose-told by podiatrist to wear these  . Peripheral neuropathy   . Peripheral neuropathy    takes Gabapentin daily  . Pneumonia    as a child  . PVD (peripheral vascular disease) ([place])   . Renal insufficiency   . Seasonal allergies    takes Mucinex and Zyrtec prn  . Urinary frequency    urgency/takes Vesicare daily     Allergies as of 11/01/2017      Reactions   Amoxicillin-pot Clavulanate Nausea Only       Morphine And Related Other (See Comments)   hallucinates      Medication List    STOP taking these medications   morphine 15 MG tablet Commonly known as:  MSIR     TAKE these medications   AMBULATORY NON FORMULARY MEDICATION 1 Units by Other route daily. Left AFO   aspirin 325 MG EC tablet Take 325 mg by mouth every evening.   atorvastatin 20 MG tablet Commonly known as:  LIPITOR Take 20 mg by mouth every evening.   cetirizine 10 MG tablet Commonly known as:  ZYRTEC ALLERGY Take 1 tablet (10 mg total) by mouth daily as needed for allergies. What changed:  how much to take   erythromycin  ophthalmic ointment USE A SMALL AMOUNT IN THE LEFT EYE DAILY AS NEEDED FOR IRRITATION   feeding supplement (GLUCERNA SHAKE) Liqd Take 237 mLs by mouth 3 (three) times daily with meals.   fenofibrate 145 MG tablet Commonly  known as:  TRICOR Take 145 mg by mouth every morning.   furosemide 40 MG tablet Commonly known as:  LASIX Take 40 mg by mouth daily as needed for fluid.   gabapentin 300 MG capsule Commonly known as:  NEURONTIN Take 300-600 mg by mouth See admin instructions. Take two (2) capsules (600 mg total) by mouth every morning and take one (1) capsule (300 mg total) by mouth every evening. Can take and additional dose one (1) capsule (300 mg total) by mouth as needed for nerve pain.   HYDROcodone-acetaminophen 5-325 MG tablet Commonly known as:  NORCO/VICODIN Take 1 tablet by mouth every 6 (six) hours as needed for severe pain.   levothyroxine 50 MCG tablet Commonly known as:  SYNTHROID, LEVOTHROID Take 50 mcg by mouth daily before breakfast.   loperamide 2 MG tablet Commonly known as:  IMODIUM A-D Take 2 mg by mouth daily.   methylcellulose 1 % ophthalmic solution Commonly known as:  ARTIFICIAL TEARS Place 1 drop into both eyes 2 (two) times daily as needed (dry eyes).   metoprolol tartrate 50 MG tablet Commonly known as:  LOPRESSOR Take 50 mg by mouth 2 (two) times daily.   nitroGLYCERIN 0.4 MG SL tablet Commonly known as:  NITROSTAT PLACE 1 TABLET UNDER TONGUE EVERY 5 MINUTES AS NEEDED FOR CHEST PAIN.   NON FORMULARY Apply 1 application topically 4 (four) times daily as needed (for wrist pain). Ibuprofen15%/Baclofen1%/Lidocaine10%/Gabapentin3% Topical Cream   pioglitazone 30 MG tablet Commonly known as:  ACTOS Take 30 mg by mouth daily.   potassium chloride SA 20 MEQ tablet Commonly known as:  K-DUR,KLOR-CON Take 20 mEq by mouth daily as needed (with Lasix).   pseudoephedrine-guaifenesin 60-600 MG 12 hr tablet Commonly known as:  MUCINEX D Take 1 tablet by mouth every 12 (twelve) hours as needed for congestion.   ramipril 10 MG capsule Commonly known as:  ALTACE Take 10 mg by mouth daily.   sodium chloride 0.65 % Soln nasal spray Commonly known as:  OCEAN Place 1  spray into both nostrils 2 (two) times daily as needed for congestion.   temazepam 15 MG capsule Commonly known as:  RESTORIL Take 15 mg by mouth at bedtime as needed for sleep.   VICKS SINEX 0.05 % nasal spray Generic drug:  oxymetazoline Place 1 spray into both nostrils 2 (two) times daily as needed for congestion.   VITAMIN B 12 PO Take 1 tablet by mouth 2 (two) times a week.       Discharge Instructions: [facility] Discharge instructions Lower Extremity Bypass Surgery  Please refer to the following instruction for your post-procedure care. Your surgeon or physician assistant will discuss any changes with you.  Activity  You are encouraged to walk as much as you can. You can slowly return to normal activities during the month after your surgery. Avoid strenuous activity and heavy lifting until your doctor tells you it's OK. Avoid activities such as vacuuming or swinging a golf club. Do not drive until your doctor give the OK and you are no longer taking prescription pain medications. It is also normal to have difficulty with sleep habits, eating and bowel movement after surgery. These will go away with time.  Bathing/Showering  You may shower after you go home. Do not soak in a bathtub, hot tub, or swim until the incision heals completely.  Incision Care  Clean your incision with mild soap and water. Shower every day. Pat the area dry with a clean towel. You do not need a bandage unless otherwise instructed. Do not apply any ointments or creams to your incision. If you have open wounds you will be instructed how to care for them or a visiting nurse may be arranged for you. If you have staples or sutures along your incision they will be removed at your post-op appointment. You may have skin glue on your incision. Do not peel it off. It will come off on its own in about one week.  Will need meticulous wound care for left groin: Wash the groin wound  with soap and water daily and pat dry. (No tub bath-only shower)  Then put a dry gauze or washcloth in the groin to keep this area dry to help prevent wound infection.  Do this daily and as needed.  Do not use Vaseline or neosporin on your incisions.  Only use soap and water on your incisions and then protect and keep dry.  Diet  Resume your normal diet. There are no special food restrictions following this procedure. A low fat/ low cholesterol diet is recommended for all patients with vascular disease. In order to heal from your surgery, it is CRITICAL to get adequate nutrition. Your body requires vitamins, minerals, and protein. Vegetables are the best source of vitamins and minerals. Vegetables also provide the perfect balance of protein. Processed food has little nutritional value, so try to avoid this.  Medications  Resume taking all your medications unless your doctor or Physician Assistant tells you not to. If your incision is causing pain, you may take over-the-counter pain relievers such as acetaminophen (Tylenol). If you were prescribed a stronger pain medication, please aware these medication can cause nausea and constipation. Prevent nausea by taking the medication with a snack or meal. Avoid constipation by drinking plenty of fluids and eating foods with high amount of fiber, such as fruits, vegetables, and grains. Take Colace 100 mg (an over-the-counter stool softener) twice a day as needed for constipation. Do not take Tylenol if you are taking prescription pain medications.  Follow Up  Our office will schedule a follow up appointment 2-3 weeks following discharge.  Please call us immediately for any of the following conditions  .Severe or worsening pain in your legs or feet while at rest or while walking .Increase pain, redness, warmth, or drainage (pus) from your incision site(s) Fever of 101 degree or higher The swelling in your leg with the bypass suddenly worsens and becomes  more painful than when you were in the hospital If you have been instructed to feel your graft pulse then you should do so every day. If you can no longer feel this pulse, call the office immediately. Not all patients are given this instruction.  Leg swelling is common after leg bypass surgery.  The swelling should improve over a few months following surgery. To improve the swelling,  elevate your legs above the level of your heart while you are sitting or resting.  Reduce your risk of vascular disease  Stop smoking. If you would like help call [facility] at 1-800-QUIT-NOW 667-073-0200) or [place] at (832) 631-7266.  Manage your cholesterol Maintain a desired weight Control your diabetes weight Control your diabetes Keep your blood pressure down  If  you have any questions, please call the office at [phone]   Prescriptions given: 1.  Vicodin#30 No Refill  Disposition: SNF  Patient's condition: is Good  Follow up: 1. Dr. Oneida Alar in 2 weeks   Leontine Locket, PA-C [facility] [phone] 11/01/2017  9:38 AM  - For VQI Registry use ---   Post-op:  Wound infection: No  Graft infection: No  Transfusion: Yes    If yes, 6 units given New Arrhythmia: No Ipsilateral amputation: No, [ ]  Minor, [ ]  BKA, [ ]  AKA Discharge patency: [x ] Primary, [ ]  Primary assisted, [ ]  Secondary, [ ]  Occluded Patency judged by: [ ]  Dopper only, [ ]  Palpable graft pulse, [x]  Palpable distal pulse, [ ]  ABI inc. > 0.15, [ ]  Duplex Discharge ABI: R 0.58, L 0.57 D/C Ambulatory Status: Ambulatory with Assistance  Complications: MI: No, [ ]  Troponin only, [ ]  EKG or Clinical CHF: No Resp failure:No, [ ]  Pneumonia, [ ]  Ventilator Chg in renal function: Yes, [x ] Inc. Cr > 0.5 (but improved postoperatively), [ ]  Temp. Dialysis,  [ ]  Permanent dialysis Stroke: No, [ ]  Minor, [ ]  Major Return to OR: No  Reason for return to OR: [ ]  Bleeding, [ ]  Infection, [ ]  Thrombosis,  [ ]  Revision  Discharge medications: Statin use:  yes ASA use:  yes Plavix use:  no Beta blocker use: yes CCB use:  No ACEI use:   yes ARB use:  no Coumadin use: no

## 2017-11-01 NOTE — Progress Notes (Addendum)
  Progress Note    11/01/2017 9:05 AM 7 Days Post-Op  Subjective:  A little confused this morning.  Says he hasn't had his surgery yet and wants to know where he is but he does know the year and who the president is.  Tm 99.5 now afebrile HR 40's-110's NSR 102'H-852'D systolic 78% RA  Vitals:   11/01/17 0750 11/01/17 0846  BP: (!) 164/53   Pulse: 69 72  Resp: 20   Temp: 97.6 F (36.4 C)   SpO2: 96%     Physical Exam: Cardiac:  regular Lungs:  Non labored Incisions:  Left groin with Pravena with good seal; other incisions are clean and dry with staples in tact.   Extremities:  Easily palpable left DP pulse;  Edema has somewhat improved from when I saw him last week. No change left great toe.   CBC    Component Value Date/Time   WBC 5.7 10/30/2017 0729   RBC 3.21 (L) 10/30/2017 0729   HGB 9.4 (L) 10/30/2017 0729   HCT 29.2 (L) 10/30/2017 0729   PLT 193 10/30/2017 0729   MCV 91.0 10/30/2017 0729   MCH 29.3 10/30/2017 0729   MCHC 32.2 10/30/2017 0729   RDW 17.1 (H) 10/30/2017 0729   LYMPHSABS 0.7 10/28/2017 2219   MONOABS 0.8 10/28/2017 2219   EOSABS 0.1 10/28/2017 2219   BASOSABS 0.0 10/28/2017 2219    BMET    Component Value Date/Time   NA 133 (L) 10/30/2017 0729   K 5.1 10/30/2017 0729   CL 104 10/30/2017 0729   CO2 23 10/30/2017 0729   GLUCOSE 137 (H) 10/30/2017 0729   BUN 41 (H) 10/30/2017 0729   CREATININE 1.48 (H) 10/30/2017 0729   CREATININE 1.32 04/18/2012 0949   CALCIUM 8.3 (L) 10/30/2017 0729   GFRNONAA 44 (L) 10/30/2017 0729   GFRAA 50 (L) 10/30/2017 0729    INR    Component Value Date/Time   INR 1.13 10/21/2017 1233     Intake/Output Summary (Last 24 hours) at 11/01/2017 0905 Last data filed at 11/01/2017 0810 Gross per 24 hour  Intake 480 ml  Output 725 ml  Net -245 ml     Assessment:  77 y.o. male is s/p:  Left common femoral endarterectomy with profundoplasty, left femoral to below-knee popliteal bypass with non-reversed left  greater saphenous vein  7 Days Post-Op  Plan: -patent bypass with easily palpable left DP pulse. -possibly to SNF early this week. -DVT prophylaxis:  SCD's -renal function has improved-will restart ACEI and prn lasix   Leontine Locket, PA-C [facility] [phone] 11/01/2017 9:05 AM

## 2017-11-01 NOTE — Progress Notes (Signed)
Called to speak with pt's daughter.  She is concerned about ulcer on pt's foot (lateral aspect) and his great toe.  I informed her that I did not feel these were any different than when I saw them last week and they are certainly not worse.  I discussed with her that he does have good blood flow to his foot and it will take time for the great toe to demarkate to determine if he will need further debridement or surgery.  He continues to have an easily palpable left DP pulse.  He still has swelling and will need to keep his leg elevated if not ambulating to help with the swelling.    Pt has bed at [facility] and will plan for transfer tomorrow.   Leontine Locket, [facility] 11/01/2017 4:51 PM

## 2017-11-01 NOTE — Progress Notes (Signed)
Clinical Social Worker following patient for discharge needs. CSW meet patient and family at bedside to discuss discharge plan. Patient has bed and authorization at [facility]. Family aware that patient has a bed and authorization through insurance. Patient daughter stated she does not want patient to go today and would rather him go tomorrow morning.    Rhea Pink, MSW,  [place]

## 2017-11-01 NOTE — Telephone Encounter (Signed)
sch appt spk to wife 11/04/17 315pm staple removal f/u MD

## 2017-11-01 NOTE — Progress Notes (Signed)
[facility] [place]  Subjective  - feels ok baseline confusion   Objective (!) 164/53 72 97.6 F (36.4 C) (Oral) 20 96%  Intake/Output Summary (Last 24 hours) at 11/01/2017 0911 Last data filed at 11/01/2017 0810 Gross per 24 hour  Intake 480 ml  Output 725 ml  Net -245 ml   Left leg: [place] dc left groin incision intact small amount serosanguinous drainage Left foot pink warm less edema  Assessment/Planning: Patent bypass Healing foot Incisions tenuous but healing so far SNF when bed available Dry gauze left groin to wick moisture Restart home ACE and lasix  Ruta Hinds 11/01/2017 9:11 AM --  Laboratory Lab Results: Recent Labs    10/30/17 0729  WBC 5.7  HGB 9.4*  HCT 29.2*  PLT 193   BMET Recent Labs    10/30/17 0729  NA 133*  K 5.1  CL 104  CO2 23  GLUCOSE 137*  BUN 41*  CREATININE 1.48*  CALCIUM 8.3*    COAG Lab Results  Component Value Date   INR 1.13 10/21/2017   INR 1.08 01/18/2012   INR 1.17 12/21/2011   No results found for: PTT

## 2017-11-02 DIAGNOSIS — J9382 Other air leak: Secondary | ICD-10-CM | POA: Diagnosis not present

## 2017-11-02 DIAGNOSIS — Z79899 Other long term (current) drug therapy: Secondary | ICD-10-CM | POA: Diagnosis not present

## 2017-11-02 DIAGNOSIS — I5032 Chronic diastolic (congestive) heart failure: Secondary | ICD-10-CM | POA: Diagnosis not present

## 2017-11-02 DIAGNOSIS — N179 Acute kidney failure, unspecified: Secondary | ICD-10-CM | POA: Diagnosis not present

## 2017-11-02 DIAGNOSIS — M7981 Nontraumatic hematoma of soft tissue: Secondary | ICD-10-CM | POA: Diagnosis not present

## 2017-11-02 DIAGNOSIS — Z7982 Long term (current) use of aspirin: Secondary | ICD-10-CM | POA: Diagnosis not present

## 2017-11-02 DIAGNOSIS — R338 Other retention of urine: Secondary | ICD-10-CM | POA: Diagnosis present

## 2017-11-02 DIAGNOSIS — W1830XA Fall on same level, unspecified, initial encounter: Secondary | ICD-10-CM | POA: Diagnosis not present

## 2017-11-02 DIAGNOSIS — T8141XA Infection following a procedure, superficial incisional surgical site, initial encounter: Secondary | ICD-10-CM | POA: Diagnosis not present

## 2017-11-02 DIAGNOSIS — Y832 Surgical operation with anastomosis, bypass or graft as the cause of abnormal reaction of the patient, or of later complication, without mention of misadventure at the time of the procedure: Secondary | ICD-10-CM | POA: Diagnosis present

## 2017-11-02 DIAGNOSIS — N401 Enlarged prostate with lower urinary tract symptoms: Secondary | ICD-10-CM | POA: Diagnosis present

## 2017-11-02 DIAGNOSIS — D62 Acute posthemorrhagic anemia: Secondary | ICD-10-CM | POA: Diagnosis not present

## 2017-11-02 DIAGNOSIS — L97529 Non-pressure chronic ulcer of other part of left foot with unspecified severity: Secondary | ICD-10-CM | POA: Diagnosis not present

## 2017-11-02 DIAGNOSIS — M6389 Disorders of muscle in diseases classified elsewhere, multiple sites: Secondary | ICD-10-CM | POA: Diagnosis not present

## 2017-11-02 DIAGNOSIS — M2550 Pain in unspecified joint: Secondary | ICD-10-CM | POA: Diagnosis not present

## 2017-11-02 DIAGNOSIS — Z95828 Presence of other vascular implants and grafts: Secondary | ICD-10-CM | POA: Diagnosis not present

## 2017-11-02 DIAGNOSIS — L98499 Non-pressure chronic ulcer of skin of other sites with unspecified severity: Secondary | ICD-10-CM | POA: Diagnosis present

## 2017-11-02 DIAGNOSIS — Z951 Presence of aortocoronary bypass graft: Secondary | ICD-10-CM | POA: Diagnosis not present

## 2017-11-02 DIAGNOSIS — K219 Gastro-esophageal reflux disease without esophagitis: Secondary | ICD-10-CM | POA: Diagnosis present

## 2017-11-02 DIAGNOSIS — T8149XA Infection following a procedure, other surgical site, initial encounter: Secondary | ICD-10-CM | POA: Diagnosis not present

## 2017-11-02 DIAGNOSIS — E1122 Type 2 diabetes mellitus with diabetic chronic kidney disease: Secondary | ICD-10-CM | POA: Diagnosis present

## 2017-11-02 DIAGNOSIS — E785 Hyperlipidemia, unspecified: Secondary | ICD-10-CM | POA: Diagnosis present

## 2017-11-02 DIAGNOSIS — G63 Polyneuropathy in diseases classified elsewhere: Secondary | ICD-10-CM | POA: Diagnosis not present

## 2017-11-02 DIAGNOSIS — Z743 Need for continuous supervision: Secondary | ICD-10-CM | POA: Diagnosis not present

## 2017-11-02 DIAGNOSIS — I5033 Acute on chronic diastolic (congestive) heart failure: Secondary | ICD-10-CM | POA: Diagnosis not present

## 2017-11-02 DIAGNOSIS — E43 Unspecified severe protein-calorie malnutrition: Secondary | ICD-10-CM | POA: Diagnosis not present

## 2017-11-02 DIAGNOSIS — E1152 Type 2 diabetes mellitus with diabetic peripheral angiopathy with gangrene: Secondary | ICD-10-CM | POA: Diagnosis not present

## 2017-11-02 DIAGNOSIS — R268 Other abnormalities of gait and mobility: Secondary | ICD-10-CM | POA: Diagnosis not present

## 2017-11-02 DIAGNOSIS — R32 Unspecified urinary incontinence: Secondary | ICD-10-CM | POA: Diagnosis not present

## 2017-11-02 DIAGNOSIS — I130 Hypertensive heart and chronic kidney disease with heart failure and stage 1 through stage 4 chronic kidney disease, or unspecified chronic kidney disease: Secondary | ICD-10-CM | POA: Diagnosis not present

## 2017-11-02 DIAGNOSIS — I70212 Atherosclerosis of native arteries of extremities with intermittent claudication, left leg: Secondary | ICD-10-CM | POA: Diagnosis not present

## 2017-11-02 DIAGNOSIS — E8779 Other fluid overload: Secondary | ICD-10-CM | POA: Diagnosis not present

## 2017-11-02 DIAGNOSIS — I9789 Other postprocedural complications and disorders of the circulatory system, not elsewhere classified: Secondary | ICD-10-CM | POA: Diagnosis not present

## 2017-11-02 DIAGNOSIS — E11622 Type 2 diabetes mellitus with other skin ulcer: Secondary | ICD-10-CM | POA: Diagnosis present

## 2017-11-02 DIAGNOSIS — Z8249 Family history of ischemic heart disease and other diseases of the circulatory system: Secondary | ICD-10-CM | POA: Diagnosis not present

## 2017-11-02 DIAGNOSIS — Z7401 Bed confinement status: Secondary | ICD-10-CM | POA: Diagnosis not present

## 2017-11-02 DIAGNOSIS — D649 Anemia, unspecified: Secondary | ICD-10-CM | POA: Diagnosis not present

## 2017-11-02 DIAGNOSIS — E119 Type 2 diabetes mellitus without complications: Secondary | ICD-10-CM | POA: Diagnosis not present

## 2017-11-02 DIAGNOSIS — R41841 Cognitive communication deficit: Secondary | ICD-10-CM | POA: Diagnosis not present

## 2017-11-02 DIAGNOSIS — E1142 Type 2 diabetes mellitus with diabetic polyneuropathy: Secondary | ICD-10-CM | POA: Diagnosis present

## 2017-11-02 DIAGNOSIS — M628 Other specified disorders of muscle: Secondary | ICD-10-CM | POA: Diagnosis not present

## 2017-11-02 DIAGNOSIS — Z515 Encounter for palliative care: Secondary | ICD-10-CM | POA: Diagnosis not present

## 2017-11-02 DIAGNOSIS — R279 Unspecified lack of coordination: Secondary | ICD-10-CM | POA: Diagnosis not present

## 2017-11-02 DIAGNOSIS — E118 Type 2 diabetes mellitus with unspecified complications: Secondary | ICD-10-CM | POA: Diagnosis not present

## 2017-11-02 DIAGNOSIS — Y92230 Patient room in hospital as the place of occurrence of the external cause: Secondary | ICD-10-CM | POA: Diagnosis not present

## 2017-11-02 DIAGNOSIS — Z87891 Personal history of nicotine dependence: Secondary | ICD-10-CM | POA: Diagnosis not present

## 2017-11-02 DIAGNOSIS — Z7189 Other specified counseling: Secondary | ICD-10-CM | POA: Diagnosis not present

## 2017-11-02 DIAGNOSIS — I739 Peripheral vascular disease, unspecified: Secondary | ICD-10-CM | POA: Diagnosis not present

## 2017-11-02 DIAGNOSIS — I10 Essential (primary) hypertension: Secondary | ICD-10-CM | POA: Diagnosis not present

## 2017-11-02 DIAGNOSIS — E039 Hypothyroidism, unspecified: Secondary | ICD-10-CM | POA: Diagnosis present

## 2017-11-02 DIAGNOSIS — N183 Chronic kidney disease, stage 3 (moderate): Secondary | ICD-10-CM | POA: Diagnosis not present

## 2017-11-02 NOTE — Care Management Important Message (Signed)
Important Message  Patient Details  Name: [name] MRN: [id number] Date of Birth: 03/11/1941   Medicare Important Message Given:  Yes    Amberleigh Gerken P Evalette [place] 11/02/2017, 1:14 PM

## 2017-11-02 NOTE — Progress Notes (Signed)
Clinical Social Worker facilitated patient discharge including contacting patient family and facility to confirm patient discharge plans.  Clinical information faxed to facility and family agreeable with plan.  CSW arranged ambulance transport via [facility] to [facility].  RN to call [phone] ([phone] for  report prior to discharge.  Clinical Social Worker will sign off for now as social work intervention is no longer needed. Please consult [place] again if new need arises.  Rhea Pink, MSW, [place]

## 2017-11-02 NOTE — Care Management Important Message (Signed)
Important Message  Patient Details  Name: JOZEF EISENBEIS MRN: [id number] Date of Birth: [birth year]   Medicare Important Message Given:  Yes    Barb Merino Lidia Clavijo 11/02/2017, 3:48 PM

## 2017-11-02 NOTE — Discharge Instructions (Signed)
° °[facility] [place] ° °Discharge instructions ° °Lower Extremity Bypass Surgery ° °Please refer to the following instruction for your post-procedure care. Your surgeon or physician assistant will discuss any changes with you. ° °Activity ° °You are encouraged to walk as much as you can. You can slowly return to normal activities during the month after your surgery. Avoid strenuous activity and heavy lifting until your doctor tells you it's OK. Avoid activities such as vacuuming or swinging a golf club. Do not drive until your doctor give the OK and you are no longer taking prescription pain medications. It is also normal to have difficulty with sleep habits, eating and bowel movement after surgery. These will go away with time. ° °Bathing/Showering ° °Shower daily after you go home. Do not soak in a bathtub, hot tub, or swim until the incision heals completely. ° °Incision Care ° °Clean your incision with mild soap and water. Shower every day. Pat the area dry with a clean towel. You do not need a bandage unless otherwise instructed. Do not apply any ointments or creams to your incision. If you have open wounds you will be instructed how to care for them or a visiting nurse may be arranged for you. If you have staples or sutures along your incision they will be removed at your post-op appointment. You may have skin glue on your incision. Do not peel it off. It will come off on its own in about one week. ° °Wash the groin wound with soap and water daily and pat dry. (No tub bath-only shower)  Then put a dry gauze or washcloth in the groin to keep this area dry to help prevent wound infection.  Do this daily and as needed.  Do not use Vaseline or neosporin on your incisions.  Only use soap and water on your incisions and then protect and keep dry. ° °Diet ° °Resume your normal diet. There are no special food restrictions following this procedure. A low fat/ low cholesterol diet is  recommended for all patients with vascular disease. In order to heal from your surgery, it is CRITICAL to get adequate nutrition. Your body requires vitamins, minerals, and protein. Vegetables are the best source of vitamins and minerals. Vegetables also provide the perfect balance of protein. Processed food has little nutritional value, so try to avoid this. ° °Medications ° °Resume taking all your medications unless your doctor or physician assistant tells you not to. If your incision is causing pain, you may take over-the-counter pain relievers such as acetaminophen (Tylenol). If you were prescribed a stronger pain medication, please aware these medication can cause nausea and constipation. Prevent nausea by taking the medication with a snack or meal. Avoid constipation by drinking plenty of fluids and eating foods with high amount of fiber, such as fruits, vegetables, and grains. Take Colace 100 mg (an over-the-counter stool softener) twice a day as needed for constipation.  °Do not take Tylenol if you are taking prescription pain medications. ° °Follow Up ° °Our office will schedule a follow up appointment 2-3 weeks following discharge. ° °Please call us immediately for any of the following conditions ° °•Severe or worsening pain in your legs or feet while at rest or while walking •Increase pain, redness, warmth, or drainage (pus) from your incision site(s) °Fever of 101 degree or higher °The swelling in your leg with the bypass suddenly worsens and becomes more painful than when you were in the hospital °If you have   been instructed to feel your graft pulse then you should do so every day. If you can no longer feel this pulse, call the office immediately. Not all patients are given this instruction.  Leg swelling is common after leg bypass surgery.  The swelling should improve over a few months following surgery. To improve the swelling, elevate your legs above the level of your heart while you are sitting  or resting.   Reduce your risk of vascular disease  Stop smoking. If you would like help call [facility] at 1-800-QUIT-NOW 657-490-3361) or [facility] at (939)694-4525.  Manage your cholesterol Maintain a desired weight Control your diabetes weight Control your diabetes Keep your blood pressure down  If you have any questions, please call the office at [phone]

## 2017-11-02 NOTE — Progress Notes (Addendum)
  Progress Note    11/02/2017 7:10 AM 8 Days Post-Op  Subjective:  Says he feels better and is moving his legs and feet better.    Afebrile HR 50's-70's NSR 378'H-885'O systolic 27% [id number]  Vitals:   11/02/17 0334 11/02/17 0430  BP: (!) 125/43 (!) 153/52  Pulse: 63 61  Resp: (!) 23 20  Temp: 98.6 F (37 C) 97.9 F (36.6 C)  SpO2: 97% 98%    Physical Exam: Cardiac:  regular Lungs:  Non labored Incisions:  Saphenectomy incisions looks good with staples in tact; the left groin incision has mild serosanguinous drainage from the proximal portion of the incision.  Extremities:  Easily palpable left DP pulse; left great toe and left lateral ulcer on foot unchanged.  No drainage.  There is some ecchymosis on the left lateral thigh from groin incision that is improving.   CBC    Component Value Date/Time   WBC 5.7 10/30/2017 0729   RBC 3.21 (L) 10/30/2017 0729   HGB 9.4 (L) 10/30/2017 0729   HCT 29.2 (L) 10/30/2017 0729   PLT 193 10/30/2017 0729   MCV 91.0 10/30/2017 0729   MCH 29.3 10/30/2017 0729   MCHC 32.2 10/30/2017 0729   RDW 17.1 (H) 10/30/2017 0729   LYMPHSABS 0.7 10/28/2017 2219   MONOABS 0.8 10/28/2017 2219   EOSABS 0.1 10/28/2017 2219   BASOSABS 0.0 10/28/2017 2219    BMET    Component Value Date/Time   NA 133 (L) 10/30/2017 0729   K 5.1 10/30/2017 0729   CL 104 10/30/2017 0729   CO2 23 10/30/2017 0729   GLUCOSE 137 (H) 10/30/2017 0729   BUN 41 (H) 10/30/2017 0729   CREATININE 1.48 (H) 10/30/2017 0729   CREATININE 1.32 04/18/2012 0949   CALCIUM 8.3 (L) 10/30/2017 0729   GFRNONAA 44 (L) 10/30/2017 0729   GFRAA 50 (L) 10/30/2017 0729    INR    Component Value Date/Time   INR 1.13 10/21/2017 1233     Intake/Output Summary (Last 24 hours) at 11/02/2017 0710 Last data filed at 11/02/2017 0520 Gross per 24 hour  Intake 960 ml  Output 1950 ml  Net -990 ml     Assessment:  77 y.o. male is s/p:  Left common femoral endarterectomy with  profundoplasty, left femoral to below-knee popliteal bypass with non-reversed left greater saphenous vein  8 Days Post-Op  Plan: -pt with easily palpable left DP pulse this morning; still with swelling but seems better this morning.  Moving his legs and feet better. -pt feeling better this morning-mental status much better -left great toe and left lateral ulcer unchanged-will need time to demarcate  -some drainage from left groin-will need meticulous wound care.  Dry gauze to left groin to wick moisture.  I did discuss this with the pt. -for SNF today   Leontine Locket, PA-C [facility] [phone] 11/02/2017 7:10 AM  Agree with above.  To [facility] most likely today  Ruta Hinds, MD [facility] [place] Office: [phone] Pager: [phone]

## 2017-11-02 NOTE — Care Management Note (Signed)
Case Management Note Marvetta Gibbons RN,BSN Unit [facility] 1-22 Case Manager  [phone]  Patient Details  Name: [name] MRN: [id number] Date of Birth: 1941/04/18  Subjective/Objective:    Pt admitted s/p Left common femoral endarterectomy with profundoplasty, left femoral to below-knee popliteal bypass                 Action/Plan: PTA pt lived at home with spouse- PT/OT evals pending- may need STSNF- CSW consulted for possible placement needs- CM will follow  Expected Discharge Date:  11/02/17               Expected Discharge Plan:  Skilled Nursing Facility  In-House Referral:  Clinical Social Work  Discharge planning Services  CM Consult  Post Acute Care Choice:    Choice offered to:     DME Arranged:    DME Agency:     HH Arranged:    [place] Agency:     Status of Service:  Completed, signed off  If discussed at [employer] of Stay Meetings, dates discussed:    Discharge Disposition: skilled facility   Additional Comments:  11/01/17- 1700- Marvetta Gibbons RN, CM- CSW following for placement needs- pt has bed at [facility]- plan to d/c in AM  10/29/17- 65- Harinder Romas RN, CM- per PT/OT evals recommendations for SNF, CSW to f/u for STSNF placement needs- pt has prevena wound VAC to groin. D/c planning for Monday 6/10.    Dawayne Patricia, RN 11/02/2017, 12:38 PM

## 2017-11-04 ENCOUNTER — Inpatient Hospital Stay (HOSPITAL_COMMUNITY)
Admission: AD | Admit: 2017-11-04 | Discharge: 2017-12-03 | DRG: 856 | Disposition: A | Discharge status: Skilled Nursing Facility | Payer: Medicare HMO | Source: Ambulatory Visit | Attending: Vascular Surgery | Admitting: Vascular Surgery

## 2017-11-04 ENCOUNTER — Encounter: Payer: Self-pay | Admitting: Vascular Surgery

## 2017-11-04 ENCOUNTER — Ambulatory Visit: Payer: Medicare HMO | Admitting: Vascular Surgery

## 2017-11-04 ENCOUNTER — Other Ambulatory Visit: Payer: Self-pay | Admitting: Physician Assistant

## 2017-11-04 VITALS — BP 154/58 | HR 65 | Temp 98.8°F | Resp 16 | Ht 71.0 in | Wt 251.0 lb

## 2017-11-04 DIAGNOSIS — S31109A Unspecified open wound of abdominal wall, unspecified quadrant without penetration into peritoneal cavity, initial encounter: Secondary | ICD-10-CM | POA: Diagnosis not present

## 2017-11-04 DIAGNOSIS — G63 Polyneuropathy in diseases classified elsewhere: Secondary | ICD-10-CM | POA: Diagnosis not present

## 2017-11-04 DIAGNOSIS — I739 Peripheral vascular disease, unspecified: Secondary | ICD-10-CM

## 2017-11-04 DIAGNOSIS — N401 Enlarged prostate with lower urinary tract symptoms: Secondary | ICD-10-CM | POA: Diagnosis not present

## 2017-11-04 DIAGNOSIS — T8141XA Infection following a procedure, superficial incisional surgical site, initial encounter: Secondary | ICD-10-CM | POA: Diagnosis not present

## 2017-11-04 DIAGNOSIS — L98499 Non-pressure chronic ulcer of skin of other sites with unspecified severity: Secondary | ICD-10-CM | POA: Diagnosis present

## 2017-11-04 DIAGNOSIS — I743 Embolism and thrombosis of arteries of the lower extremities: Secondary | ICD-10-CM | POA: Diagnosis not present

## 2017-11-04 DIAGNOSIS — Z7982 Long term (current) use of aspirin: Secondary | ICD-10-CM

## 2017-11-04 DIAGNOSIS — I10 Essential (primary) hypertension: Secondary | ICD-10-CM | POA: Diagnosis present

## 2017-11-04 DIAGNOSIS — I2510 Atherosclerotic heart disease of native coronary artery without angina pectoris: Secondary | ICD-10-CM | POA: Diagnosis present

## 2017-11-04 DIAGNOSIS — Y92230 Patient room in hospital as the place of occurrence of the external cause: Secondary | ICD-10-CM | POA: Diagnosis not present

## 2017-11-04 DIAGNOSIS — M7981 Nontraumatic hematoma of soft tissue: Secondary | ICD-10-CM | POA: Diagnosis present

## 2017-11-04 DIAGNOSIS — B9620 Unspecified Escherichia coli [E. coli] as the cause of diseases classified elsewhere: Secondary | ICD-10-CM | POA: Diagnosis present

## 2017-11-04 DIAGNOSIS — R2681 Unsteadiness on feet: Secondary | ICD-10-CM | POA: Diagnosis not present

## 2017-11-04 DIAGNOSIS — Z515 Encounter for palliative care: Secondary | ICD-10-CM | POA: Diagnosis not present

## 2017-11-04 DIAGNOSIS — Z7401 Bed confinement status: Secondary | ICD-10-CM | POA: Diagnosis not present

## 2017-11-04 DIAGNOSIS — L97529 Non-pressure chronic ulcer of other part of left foot with unspecified severity: Secondary | ICD-10-CM | POA: Diagnosis not present

## 2017-11-04 DIAGNOSIS — T8149XA Infection following a procedure, other surgical site, initial encounter: Secondary | ICD-10-CM | POA: Diagnosis not present

## 2017-11-04 DIAGNOSIS — N3289 Other specified disorders of bladder: Secondary | ICD-10-CM | POA: Diagnosis not present

## 2017-11-04 DIAGNOSIS — E1152 Type 2 diabetes mellitus with diabetic peripheral angiopathy with gangrene: Secondary | ICD-10-CM | POA: Diagnosis present

## 2017-11-04 DIAGNOSIS — Z87891 Personal history of nicotine dependence: Secondary | ICD-10-CM | POA: Diagnosis not present

## 2017-11-04 DIAGNOSIS — Z794 Long term (current) use of insulin: Secondary | ICD-10-CM

## 2017-11-04 DIAGNOSIS — Z48812 Encounter for surgical aftercare following surgery on the circulatory system: Secondary | ICD-10-CM | POA: Diagnosis not present

## 2017-11-04 DIAGNOSIS — M2550 Pain in unspecified joint: Secondary | ICD-10-CM | POA: Diagnosis not present

## 2017-11-04 DIAGNOSIS — E1142 Type 2 diabetes mellitus with diabetic polyneuropathy: Secondary | ICD-10-CM | POA: Diagnosis present

## 2017-11-04 DIAGNOSIS — D62 Acute posthemorrhagic anemia: Secondary | ICD-10-CM | POA: Diagnosis present

## 2017-11-04 DIAGNOSIS — K219 Gastro-esophageal reflux disease without esophagitis: Secondary | ICD-10-CM | POA: Diagnosis present

## 2017-11-04 DIAGNOSIS — E785 Hyperlipidemia, unspecified: Secondary | ICD-10-CM | POA: Diagnosis present

## 2017-11-04 DIAGNOSIS — Z951 Presence of aortocoronary bypass graft: Secondary | ICD-10-CM

## 2017-11-04 DIAGNOSIS — E8770 Fluid overload, unspecified: Secondary | ICD-10-CM

## 2017-11-04 DIAGNOSIS — M628 Other specified disorders of muscle: Secondary | ICD-10-CM | POA: Diagnosis not present

## 2017-11-04 DIAGNOSIS — T8149XD Infection following a procedure, other surgical site, subsequent encounter: Secondary | ICD-10-CM | POA: Diagnosis not present

## 2017-11-04 DIAGNOSIS — W1830XA Fall on same level, unspecified, initial encounter: Secondary | ICD-10-CM | POA: Diagnosis not present

## 2017-11-04 DIAGNOSIS — I5032 Chronic diastolic (congestive) heart failure: Secondary | ICD-10-CM | POA: Diagnosis not present

## 2017-11-04 DIAGNOSIS — E8779 Other fluid overload: Secondary | ICD-10-CM | POA: Diagnosis not present

## 2017-11-04 DIAGNOSIS — Z7189 Other specified counseling: Secondary | ICD-10-CM | POA: Diagnosis not present

## 2017-11-04 DIAGNOSIS — M545 Low back pain: Secondary | ICD-10-CM | POA: Diagnosis not present

## 2017-11-04 DIAGNOSIS — N183 Chronic kidney disease, stage 3 unspecified: Secondary | ICD-10-CM | POA: Diagnosis present

## 2017-11-04 DIAGNOSIS — E118 Type 2 diabetes mellitus with unspecified complications: Secondary | ICD-10-CM | POA: Diagnosis not present

## 2017-11-04 DIAGNOSIS — Z79899 Other long term (current) drug therapy: Secondary | ICD-10-CM

## 2017-11-04 DIAGNOSIS — K5900 Constipation, unspecified: Secondary | ICD-10-CM | POA: Diagnosis present

## 2017-11-04 DIAGNOSIS — Y832 Surgical operation with anastomosis, bypass or graft as the cause of abnormal reaction of the patient, or of later complication, without mention of misadventure at the time of the procedure: Secondary | ICD-10-CM | POA: Diagnosis present

## 2017-11-04 DIAGNOSIS — W19XXXA Unspecified fall, initial encounter: Secondary | ICD-10-CM | POA: Diagnosis not present

## 2017-11-04 DIAGNOSIS — D649 Anemia, unspecified: Secondary | ICD-10-CM | POA: Diagnosis not present

## 2017-11-04 DIAGNOSIS — E876 Hypokalemia: Secondary | ICD-10-CM | POA: Diagnosis not present

## 2017-11-04 DIAGNOSIS — N179 Acute kidney failure, unspecified: Secondary | ICD-10-CM | POA: Diagnosis not present

## 2017-11-04 DIAGNOSIS — I5033 Acute on chronic diastolic (congestive) heart failure: Secondary | ICD-10-CM | POA: Diagnosis present

## 2017-11-04 DIAGNOSIS — R278 Other lack of coordination: Secondary | ICD-10-CM | POA: Diagnosis not present

## 2017-11-04 DIAGNOSIS — E43 Unspecified severe protein-calorie malnutrition: Secondary | ICD-10-CM | POA: Diagnosis present

## 2017-11-04 DIAGNOSIS — E039 Hypothyroidism, unspecified: Secondary | ICD-10-CM | POA: Diagnosis present

## 2017-11-04 DIAGNOSIS — Z8249 Family history of ischemic heart disease and other diseases of the circulatory system: Secondary | ICD-10-CM

## 2017-11-04 DIAGNOSIS — R41841 Cognitive communication deficit: Secondary | ICD-10-CM | POA: Diagnosis not present

## 2017-11-04 DIAGNOSIS — Z6837 Body mass index (BMI) 37.0-37.9, adult: Secondary | ICD-10-CM

## 2017-11-04 DIAGNOSIS — E11622 Type 2 diabetes mellitus with other skin ulcer: Secondary | ICD-10-CM | POA: Diagnosis present

## 2017-11-04 DIAGNOSIS — I130 Hypertensive heart and chronic kidney disease with heart failure and stage 1 through stage 4 chronic kidney disease, or unspecified chronic kidney disease: Secondary | ICD-10-CM | POA: Diagnosis present

## 2017-11-04 DIAGNOSIS — R338 Other retention of urine: Secondary | ICD-10-CM | POA: Diagnosis not present

## 2017-11-04 DIAGNOSIS — S3993XA Unspecified injury of pelvis, initial encounter: Secondary | ICD-10-CM | POA: Diagnosis not present

## 2017-11-04 DIAGNOSIS — R279 Unspecified lack of coordination: Secondary | ICD-10-CM | POA: Diagnosis not present

## 2017-11-04 DIAGNOSIS — E1122 Type 2 diabetes mellitus with diabetic chronic kidney disease: Secondary | ICD-10-CM | POA: Diagnosis not present

## 2017-11-04 DIAGNOSIS — R32 Unspecified urinary incontinence: Secondary | ICD-10-CM | POA: Diagnosis present

## 2017-11-04 DIAGNOSIS — E119 Type 2 diabetes mellitus without complications: Secondary | ICD-10-CM | POA: Diagnosis not present

## 2017-11-04 DIAGNOSIS — Z743 Need for continuous supervision: Secondary | ICD-10-CM | POA: Diagnosis not present

## 2017-11-04 DIAGNOSIS — J9382 Other air leak: Secondary | ICD-10-CM | POA: Diagnosis not present

## 2017-11-04 DIAGNOSIS — R52 Pain, unspecified: Secondary | ICD-10-CM | POA: Diagnosis not present

## 2017-11-04 DIAGNOSIS — I9789 Other postprocedural complications and disorders of the circulatory system, not elsewhere classified: Secondary | ICD-10-CM | POA: Diagnosis not present

## 2017-11-04 DIAGNOSIS — I5043 Acute on chronic combined systolic (congestive) and diastolic (congestive) heart failure: Secondary | ICD-10-CM | POA: Diagnosis present

## 2017-11-04 DIAGNOSIS — R102 Pelvic and perineal pain: Secondary | ICD-10-CM | POA: Diagnosis not present

## 2017-11-04 DIAGNOSIS — B961 Klebsiella pneumoniae [K. pneumoniae] as the cause of diseases classified elsewhere: Secondary | ICD-10-CM | POA: Diagnosis present

## 2017-11-04 DIAGNOSIS — Z86718 Personal history of other venous thrombosis and embolism: Secondary | ICD-10-CM

## 2017-11-04 LAB — TYPE AND SCREEN
ABO/RH(D): A POS
ANTIBODY SCREEN: NEGATIVE

## 2017-11-04 LAB — COMPREHENSIVE METABOLIC PANEL
ALBUMIN: 2.2 g/dL — AB (ref 3.5–5.0)
ALT: 21 U/L (ref 17–63)
AST: 28 U/L (ref 15–41)
Alkaline Phosphatase: 38 U/L (ref 38–126)
Anion gap: 9 (ref 5–15)
BILIRUBIN TOTAL: 1.3 mg/dL — AB (ref 0.3–1.2)
BUN: 29 mg/dL — AB (ref 6–20)
CO2: 26 mmol/L (ref 22–32)
CREATININE: 1.39 mg/dL — AB (ref 0.61–1.24)
Calcium: 8.5 mg/dL — ABNORMAL LOW (ref 8.9–10.3)
Chloride: 99 mmol/L — ABNORMAL LOW (ref 101–111)
GFR calc Af Amer: 54 mL/min — ABNORMAL LOW (ref >60)
GFR calc non Af Amer: 47 mL/min — ABNORMAL LOW (ref >60)
GLUCOSE: 162 mg/dL — AB (ref 65–99)
Potassium: 4.5 mmol/L (ref 3.5–5.1)
Sodium: 134 mmol/L — ABNORMAL LOW (ref 135–145)
TOTAL PROTEIN: 5.7 g/dL — AB (ref 6.5–8.1)

## 2017-11-04 LAB — GLUCOSE, CAPILLARY: Glucose-Capillary: 160 mg/dL — ABNORMAL HIGH (ref 65–99)

## 2017-11-04 LAB — CBC
HEMATOCRIT: 32.6 % — AB (ref 39.0–52.0)
HEMOGLOBIN: 10.3 g/dL — AB (ref 13.0–17.0)
MCH: 29.7 pg (ref 26.0–34.0)
MCHC: 31.6 g/dL (ref 30.0–36.0)
MCV: 93.9 fL (ref 78.0–100.0)
Platelets: 319 10*3/uL (ref 150–400)
RBC: 3.47 MIL/uL — AB (ref 4.22–5.81)
RDW: 17 % — AB (ref 11.5–15.5)
WBC: 9.5 10*3/uL (ref 4.0–10.5)

## 2017-11-04 LAB — PROTIME-INR
INR: 1.2
Prothrombin Time: 15.1 seconds (ref 11.4–15.2)

## 2017-11-04 LAB — URINALYSIS, ROUTINE W REFLEX MICROSCOPIC
Bilirubin Urine: NEGATIVE
Glucose, UA: NEGATIVE mg/dL
Hgb urine dipstick: NEGATIVE
KETONES UR: NEGATIVE mg/dL
LEUKOCYTES UA: NEGATIVE
NITRITE: NEGATIVE
PH: 5 (ref 5.0–8.0)
PROTEIN: NEGATIVE mg/dL
Specific Gravity, Urine: 1.016 (ref 1.005–1.030)

## 2017-11-04 MED ORDER — GUAIFENESIN-DM 100-10 MG/5ML PO SYRP: 15.0000 mL | ORAL_SOLUTION | ORAL | Status: DC | PRN

## 2017-11-04 MED ORDER — VANCOMYCIN HCL 10 G IV SOLR
2000.0000 mg | Freq: Once | INTRAVENOUS | Status: AC
  Administered 2017-11-04: 2000 mg via INTRAVENOUS
  Filled 2017-11-04 (×2): qty 2000

## 2017-11-04 MED ORDER — HYDRALAZINE HCL 20 MG/ML IJ SOLN
5.0000 mg | INTRAMUSCULAR | Status: DC | PRN
  Administered 2017-11-23: 5 mg via INTRAVENOUS
  Filled 2017-11-04: qty 1

## 2017-11-04 MED ORDER — LABETALOL HCL 5 MG/ML IV SOLN
10.0000 mg | INTRAVENOUS | Status: DC | PRN
  Filled 2017-11-04: qty 4

## 2017-11-04 MED ORDER — PHENOL 1.4 % MT LIQD: 1.0000 | OROMUCOSAL | Status: DC | PRN

## 2017-11-04 MED ORDER — VANCOMYCIN HCL 10 G IV SOLR
1250.0000 mg | INTRAVENOUS | Status: DC
  Administered 2017-11-05: 1250 mg via INTRAVENOUS
  Filled 2017-11-04 (×2): qty 1250

## 2017-11-04 MED ORDER — PANTOPRAZOLE SODIUM 40 MG PO TBEC
40.0000 mg | DELAYED_RELEASE_TABLET | Freq: Every day | ORAL | Status: DC
  Administered 2017-11-04 – 2017-12-03 (×30): 40 mg via ORAL
  Filled 2017-11-04 (×30): qty 1

## 2017-11-04 MED ORDER — SODIUM CHLORIDE 0.9 % IV SOLN
INTRAVENOUS | Status: DC
  Administered 2017-11-04 – 2017-11-06 (×3): via INTRAVENOUS

## 2017-11-04 MED ORDER — INSULIN ASPART 100 UNIT/ML ~~LOC~~ SOLN
0.0000 [IU] | Freq: Three times a day (TID) | SUBCUTANEOUS | Status: DC
  Administered 2017-11-05: 3 [IU] via SUBCUTANEOUS
  Administered 2017-11-06 (×2): 5 [IU] via SUBCUTANEOUS
  Administered 2017-11-06 – 2017-11-07 (×3): 3 [IU] via SUBCUTANEOUS
  Administered 2017-11-07: 2 [IU] via SUBCUTANEOUS
  Administered 2017-11-07 – 2017-11-08 (×2): 3 [IU] via SUBCUTANEOUS
  Administered 2017-11-08: 2 [IU] via SUBCUTANEOUS
  Administered 2017-11-08: 3 [IU] via SUBCUTANEOUS
  Administered 2017-11-09: 5 [IU] via SUBCUTANEOUS
  Administered 2017-11-09 (×2): 3 [IU] via SUBCUTANEOUS
  Administered 2017-11-10 – 2017-11-11 (×4): 2 [IU] via SUBCUTANEOUS
  Administered 2017-11-11 – 2017-11-12 (×2): 3 [IU] via SUBCUTANEOUS
  Administered 2017-11-12: 2 [IU] via SUBCUTANEOUS
  Administered 2017-11-12: 5 [IU] via SUBCUTANEOUS
  Administered 2017-11-13: 3 [IU] via SUBCUTANEOUS
  Administered 2017-11-13 – 2017-11-14 (×2): 2 [IU] via SUBCUTANEOUS
  Administered 2017-11-15 (×2): 3 [IU] via SUBCUTANEOUS
  Administered 2017-11-16 (×3): 2 [IU] via SUBCUTANEOUS
  Administered 2017-11-17 – 2017-11-18 (×2): 3 [IU] via SUBCUTANEOUS
  Administered 2017-11-18: 2 [IU] via SUBCUTANEOUS
  Administered 2017-11-18: 3 [IU] via SUBCUTANEOUS
  Administered 2017-11-19: 2 [IU] via SUBCUTANEOUS
  Administered 2017-11-19: 3 [IU] via SUBCUTANEOUS
  Administered 2017-11-19 – 2017-11-20 (×2): 2 [IU] via SUBCUTANEOUS
  Administered 2017-11-20 (×2): 3 [IU] via SUBCUTANEOUS
  Administered 2017-11-21 – 2017-11-22 (×5): 2 [IU] via SUBCUTANEOUS
  Administered 2017-11-23 (×2): 3 [IU] via SUBCUTANEOUS
  Administered 2017-11-23 – 2017-11-24 (×2): 2 [IU] via SUBCUTANEOUS
  Administered 2017-11-24: 5 [IU] via SUBCUTANEOUS
  Administered 2017-11-24 – 2017-11-26 (×6): 2 [IU] via SUBCUTANEOUS
  Administered 2017-11-26: 3 [IU] via SUBCUTANEOUS
  Administered 2017-11-27 – 2017-11-29 (×7): 2 [IU] via SUBCUTANEOUS
  Administered 2017-11-29 – 2017-11-30 (×4): 3 [IU] via SUBCUTANEOUS
  Administered 2017-12-01: 5 [IU] via SUBCUTANEOUS
  Administered 2017-12-01: 2 [IU] via SUBCUTANEOUS
  Administered 2017-12-02 (×2): 3 [IU] via SUBCUTANEOUS

## 2017-11-04 MED ORDER — PIPERACILLIN-TAZOBACTAM 3.375 G IVPB
3.3750 g | Freq: Three times a day (TID) | INTRAVENOUS | Status: DC
Start: 2017-11-04 — End: 2017-11-11
  Administered 2017-11-04 – 2017-11-11 (×19): 3.375 g via INTRAVENOUS
  Filled 2017-11-04 (×20): qty 50

## 2017-11-04 MED ORDER — ACETAMINOPHEN 650 MG RE SUPP: 325.0000 mg | RECTAL | Status: DC | PRN

## 2017-11-04 MED ORDER — METOPROLOL TARTRATE 5 MG/5ML IV SOLN: 2.0000 mg | INTRAVENOUS | Status: DC | PRN

## 2017-11-04 MED ORDER — ACETAMINOPHEN 325 MG PO TABS
325.0000 mg | ORAL_TABLET | ORAL | Status: DC | PRN
  Administered 2017-11-30 – 2017-12-02 (×4): 650 mg via ORAL
  Filled 2017-11-04 (×4): qty 2

## 2017-11-04 MED ORDER — POTASSIUM CHLORIDE CRYS ER 20 MEQ PO TBCR: 20.0000 meq | EXTENDED_RELEASE_TABLET | Freq: Once | ORAL | Status: DC

## 2017-11-04 MED ORDER — OXYCODONE-ACETAMINOPHEN 5-325 MG PO TABS
1.0000 | ORAL_TABLET | ORAL | Status: DC | PRN
  Administered 2017-11-05 – 2017-11-06 (×3): 2 via ORAL
  Filled 2017-11-04 (×3): qty 2

## 2017-11-04 MED ORDER — HYDROMORPHONE HCL 1 MG/ML IJ SOLN
0.5000 mg | INTRAMUSCULAR | Status: DC | PRN
  Administered 2017-11-05 – 2017-11-08 (×2): 1 mg via INTRAVENOUS
  Administered 2017-11-18: 0.5 mg via INTRAVENOUS
  Filled 2017-11-04 (×7): qty 1

## 2017-11-04 MED ORDER — ALUM & MAG HYDROXIDE-SIMETH 200-200-20 MG/5ML PO SUSP
15.0000 mL | ORAL | Status: DC | PRN
  Administered 2017-11-13: 30 mL via ORAL
  Filled 2017-11-04: qty 30

## 2017-11-04 MED ORDER — ONDANSETRON HCL 4 MG/2ML IJ SOLN
4.0000 mg | Freq: Four times a day (QID) | INTRAMUSCULAR | Status: DC | PRN
  Administered 2017-11-05: 4 mg via INTRAVENOUS
  Filled 2017-11-04: qty 2

## 2017-11-04 MED ORDER — HEPARIN SODIUM (PORCINE) 5000 UNIT/ML IJ SOLN
5000.0000 [IU] | Freq: Three times a day (TID) | INTRAMUSCULAR | Status: DC
  Administered 2017-11-04 – 2017-12-03 (×82): 5000 [IU] via SUBCUTANEOUS
  Filled 2017-11-04 (×81): qty 1

## 2017-11-04 NOTE — Progress Notes (Signed)
Pharmacy Antibiotic Note  [name] is a 77 y.o. male admitted on 11/04/2017 with wound infection.  Pharmacy has been consulted for vancomycin and zosyn dosing.  Plan: Vancomycin 2g x 1 now. Then Vancomycin 1250 mg IV every 24 hours.  Goal trough 15-20 mcg/mL. Zosyn 3.375g IV q8h (4 hour infusion).  F/u cultures, renal function and clinical course.    Temp (24hrs), Avg:98.5 F (36.9 C), Min:98.2 F (36.8 C), Max:98.8 F (37.1 C)  Recent Labs  Lab 10/28/17 2219 10/29/17 0627 10/30/17 0729  WBC 9.3 7.4 5.7  CREATININE  --  1.82* 1.48*    Estimated Creatinine Clearance: 52.8 mL/min (A) (by C-G formula based on SCr of 1.48 mg/dL (H)).    Allergies  Allergen Reactions  . Amoxicillin-Pot Clavulanate Nausea Only    Has patient had a PCN reaction causing immediate rash, facial/tongue/throat swelling, SOB or lightheadedness with hypotension: No Has patient had a PCN reaction causing severe rash involving mucus membranes or skin necrosis: No Has patient had a PCN reaction that required hospitalization: No Has patient had a PCN reaction occurring within the last 10 years: No If all of the above answers are "NO", then may proceed with Cephalosporin use.   Marland Kitchen Morphine And Related Other (See Comments)    hallucinates    Antimicrobials this admission: Vancomycin 6/13 > Zosyn 6/13 >   Dose adjustments this admission:  Microbiology results:   Thank you for allowing pharmacy to be a part of this patient's care.  Marguerite Olea, [facility] Clinical Pharmacist Pager [phone]  11/04/2017 6:51 PM

## 2017-11-04 NOTE — Progress Notes (Signed)
Patient is a 77 year old male who returns for postoperative follow-up today.  He underwent left common femoral endarterectomy and left femoral to below-knee popliteal bypass with vein 10 days ago.  [facility]  He is having some bloody drainage from his left groin.  He denies any fever or chills.  He is incontinent of urine and stool frequently.  Physical exam:  Vitals:   11/04/17 1529  BP: (!) 154/58  Pulse: 65  Resp: 16  Temp: 98.8 F (37.1 C)  TempSrc: Oral  SpO2: 97%  Weight: 251 lb (113.9 kg)  Height: 5\' 11"  (1.803 m)    Extremities: Left groin dark old appearing blood expressible from a 2 mm opening in the crease of his groin with some superficial skin necrosis adjacent to this.  No obvious erythema.  Staples are intact down the remainder of the leg with some peri-staple inflammation no obvious hematoma or erythema.  2+ left dorsalis pedis pulse.  Gangrenous tip of left first toe and ankle area are still dry and healing.  Assessment: Poorly healing groin left leg high risk for aortic graft infection  Plan: Admit today for broad-spectrum IV antibiotics.  Plan for left groin debridement in the operating room tomorrow.  Risk benefits possible complications including infection of the aortic graft were all explained to the patient and his family today.  Ruta Hinds, MD [facility] [place] Office: [phone] Pager: [phone]

## 2017-11-04 NOTE — H&P (View-Only) (Signed)
Patient is a 77 year old male who returns for postoperative follow-up today.  He underwent left common femoral endarterectomy and left femoral to below-knee popliteal bypass with vein 10 days ago.  [facility]  He is having some bloody drainage from his left groin.  He denies any fever or chills.  He is incontinent of urine and stool frequently.  Physical exam:  Vitals:   11/04/17 1529  BP: (!) 154/58  Pulse: 65  Resp: 16  Temp: 98.8 F (37.1 C)  TempSrc: Oral  SpO2: 97%  Weight: 251 lb (113.9 kg)  Height: 5\' 11"  (1.803 m)    Extremities: Left groin dark old appearing blood expressible from a 2 mm opening in the crease of his groin with some superficial skin necrosis adjacent to this.  No obvious erythema.  Staples are intact down the remainder of the leg with some peri-staple inflammation no obvious hematoma or erythema.  2+ left dorsalis pedis pulse.  Gangrenous tip of left first toe and ankle area are still dry and healing.  Assessment: Poorly healing groin left leg high risk for aortic graft infection  Plan: Admit today for broad-spectrum IV antibiotics.  Plan for left groin debridement in the operating room tomorrow.  Risk benefits possible complications including infection of the aortic graft were all explained to the patient and his family today.  Ruta Hinds, MD [facility] [place] Office: [phone] Pager: [phone]

## 2017-11-05 ENCOUNTER — Inpatient Hospital Stay (HOSPITAL_COMMUNITY): Payer: Medicare HMO | Admitting: Anesthesiology

## 2017-11-05 ENCOUNTER — Encounter (HOSPITAL_COMMUNITY): Payer: Self-pay | Admitting: Certified Registered Nurse Anesthetist

## 2017-11-05 ENCOUNTER — Inpatient Hospital Stay (HOSPITAL_COMMUNITY): Admission: RE | Admit: 2017-11-05 | Payer: Medicare HMO | Source: Ambulatory Visit | Admitting: Vascular Surgery

## 2017-11-05 ENCOUNTER — Encounter (HOSPITAL_COMMUNITY)
Admission: AD | Disposition: A | Discharge status: Skilled Nursing Facility | Payer: Self-pay | Source: Ambulatory Visit | Attending: Vascular Surgery

## 2017-11-05 DIAGNOSIS — I9789 Other postprocedural complications and disorders of the circulatory system, not elsewhere classified: Secondary | ICD-10-CM

## 2017-11-05 HISTORY — PX: WOUND DEBRIDEMENT: SHX247

## 2017-11-05 LAB — GLUCOSE, CAPILLARY
GLUCOSE-CAPILLARY: 175 mg/dL — AB (ref 65–99)
GLUCOSE-CAPILLARY: 109 mg/dL — AB (ref 65–99)
GLUCOSE-CAPILLARY: 206 mg/dL — AB (ref 65–99)
Glucose-Capillary: 111 mg/dL — ABNORMAL HIGH (ref 65–99)
Glucose-Capillary: 125 mg/dL — ABNORMAL HIGH (ref 65–99)
Glucose-Capillary: 162 mg/dL — ABNORMAL HIGH (ref 65–99)

## 2017-11-05 LAB — SURGICAL PCR SCREEN
MRSA, PCR: NEGATIVE
Staphylococcus aureus: NEGATIVE

## 2017-11-05 SURGERY — DEBRIDEMENT, WOUND
Anesthesia: General | Site: Groin | Laterality: Left

## 2017-11-05 MED ORDER — ENSURE ENLIVE PO LIQD
237.0000 mL | Freq: Three times a day (TID) | ORAL | Status: DC
  Administered 2017-11-10 – 2017-11-11 (×3): 237 mL via ORAL

## 2017-11-05 MED ORDER — ONDANSETRON HCL 4 MG/2ML IJ SOLN
INTRAMUSCULAR | Status: DC | PRN
  Administered 2017-11-05: 4 mg via INTRAVENOUS

## 2017-11-05 MED ORDER — ENSURE PO LIQD: 237.0000 mL | Freq: Three times a day (TID) | ORAL | Status: DC

## 2017-11-05 MED ORDER — EPHEDRINE SULFATE 50 MG/ML IJ SOLN
INTRAMUSCULAR | Status: DC | PRN
  Administered 2017-11-05: 10 mg via INTRAVENOUS

## 2017-11-05 MED ORDER — PROPOFOL 10 MG/ML IV BOLUS
INTRAVENOUS | Status: DC | PRN
  Administered 2017-11-05: 100 mg via INTRAVENOUS
  Administered 2017-11-05: 40 mg via INTRAVENOUS

## 2017-11-05 MED ORDER — GLUCERNA SHAKE PO LIQD
237.0000 mL | Freq: Three times a day (TID) | ORAL | Status: DC
  Filled 2017-11-05: qty 237

## 2017-11-05 MED ORDER — PHENYLEPHRINE HCL 10 MG/ML IJ SOLN
INTRAMUSCULAR | Status: DC | PRN
  Administered 2017-11-05: 50 ug/min via INTRAVENOUS

## 2017-11-05 MED ORDER — ALBUMIN HUMAN 5 % IV SOLN
INTRAVENOUS | Status: DC | PRN
  Administered 2017-11-05: 14:00:00 via INTRAVENOUS

## 2017-11-05 MED ORDER — FENTANYL CITRATE (PF) 100 MCG/2ML IJ SOLN
INTRAMUSCULAR | Status: DC | PRN
  Administered 2017-11-05 (×3): 50 ug via INTRAVENOUS
  Administered 2017-11-05 (×4): 25 ug via INTRAVENOUS

## 2017-11-05 MED ORDER — LIDOCAINE 2% (20 MG/ML) 5 ML SYRINGE
INTRAMUSCULAR | Status: DC | PRN
  Administered 2017-11-05: 100 mg via INTRAVENOUS

## 2017-11-05 MED ORDER — LACTATED RINGERS IV SOLN
INTRAVENOUS | Status: DC
  Administered 2017-11-05: 12:00:00 via INTRAVENOUS

## 2017-11-05 MED ORDER — FENTANYL CITRATE (PF) 100 MCG/2ML IJ SOLN
25.0000 ug | INTRAMUSCULAR | Status: DC | PRN
  Administered 2017-11-05 (×5): 25 ug via INTRAVENOUS

## 2017-11-05 MED ORDER — 0.9 % SODIUM CHLORIDE (POUR BTL) OPTIME
TOPICAL | Status: DC | PRN
  Administered 2017-11-05: 6000 mL

## 2017-11-05 MED ORDER — DOCUSATE SODIUM 100 MG PO CAPS
100.0000 mg | ORAL_CAPSULE | Freq: Every day | ORAL | Status: DC
  Administered 2017-11-05 – 2017-11-09 (×5): 100 mg via ORAL
  Filled 2017-11-05 (×5): qty 1

## 2017-11-05 MED ORDER — FENTANYL CITRATE (PF) 100 MCG/2ML IJ SOLN
INTRAMUSCULAR | Status: AC
  Filled 2017-11-05: qty 2

## 2017-11-05 MED ORDER — DEXAMETHASONE SODIUM PHOSPHATE 4 MG/ML IJ SOLN
INTRAMUSCULAR | Status: DC | PRN
  Administered 2017-11-05: 4 mg via INTRAVENOUS

## 2017-11-05 MED ORDER — PROMETHAZINE HCL 25 MG/ML IJ SOLN: 6.2500 mg | INTRAMUSCULAR | Status: DC | PRN

## 2017-11-05 SURGICAL SUPPLY — 26 items
CANISTER SUCT 3000ML PPV (MISCELLANEOUS) ×2 IMPLANT
COVER SURGICAL LIGHT HANDLE (MISCELLANEOUS) ×2 IMPLANT
DRSG VAC ATS SM SENSATRAC (GAUZE/BANDAGES/DRESSINGS) ×1 IMPLANT
ELECT REM PT RETURN 9FT ADLT (ELECTROSURGICAL) ×2
ELECTRODE REM PT RTRN 9FT ADLT (ELECTROSURGICAL) ×1 IMPLANT
GLOVE BIO SURGEON STRL SZ 6.5 (GLOVE) ×2 IMPLANT
GLOVE BIOGEL PI IND STRL 7.5 (GLOVE) ×1 IMPLANT
GLOVE BIOGEL PI INDICATOR 7.5 (GLOVE) ×1
GLOVE ECLIPSE 7.5 STRL STRAW (GLOVE) ×1 IMPLANT
GLOVE SURG SS PI 7.5 STRL IVOR (GLOVE) ×2 IMPLANT
GOWN STRL REUS W/ TWL LRG LVL3 (GOWN DISPOSABLE) ×3 IMPLANT
GOWN STRL REUS W/TWL 2XL LVL3 (GOWN DISPOSABLE) ×2 IMPLANT
GOWN STRL REUS W/TWL LRG LVL3 (GOWN DISPOSABLE) ×6
KIT BASIN OR (CUSTOM PROCEDURE TRAY) ×2 IMPLANT
KIT DRAINAGE VACCUM ASSIST (KITS) ×1 IMPLANT
KIT TURNOVER KIT B (KITS) ×2 IMPLANT
NS IRRIG 1000ML POUR BTL (IV SOLUTION) ×4 IMPLANT
PACK GENERAL/GYN (CUSTOM PROCEDURE TRAY) ×2 IMPLANT
PAD ARMBOARD 7.5X6 YLW CONV (MISCELLANEOUS) ×4 IMPLANT
SUT VIC AB 2-0 CT1 27 (SUTURE) ×8
SUT VIC AB 2-0 CT1 TAPERPNT 27 (SUTURE) IMPLANT
SWAB CULTURE LIQ STUART DBL (MISCELLANEOUS) ×1 IMPLANT
SWAB CULTURE LIQUID MINI MALE (MISCELLANEOUS) ×1 IMPLANT
TOWEL GREEN STERILE (TOWEL DISPOSABLE) ×2 IMPLANT
WATER STERILE IRR 1000ML POUR (IV SOLUTION) ×4 IMPLANT
YANKAUER SUCT BULB TIP NO VENT (SUCTIONS) ×1 IMPLANT

## 2017-11-05 NOTE — Interval H&P Note (Signed)
History and Physical Interval Note:  11/05/2017 12:47 PM  [name]  has presented today for surgery, with the diagnosis of infected left groin wound  The various methods of treatment have been discussed with the patient and family. After consideration of risks, benefits and other options for treatment, the patient has consented to  Procedure(s): DEBRIDEMENT WOUND LEFT GROIN (Left) as a surgical intervention .  The patient's history has been reviewed, patient examined, no change in status, stable for surgery.  I have reviewed the patient's chart and labs.  Questions were answered to the patient's satisfaction.     Ruta Hinds

## 2017-11-05 NOTE — Anesthesia Preprocedure Evaluation (Addendum)
Anesthesia Evaluation  Patient identified by MRN, date of birth, ID band Patient awake    Reviewed: Allergy & Precautions, NPO status , Patient's Chart, lab work & pertinent test results  History of Anesthesia Complications Negative for: history of anesthetic complications  Airway Mallampati: III  TM Distance: >3 FB Neck ROM: Full    Dental  (+) Teeth Intact, Dental Advisory Given   Pulmonary neg pulmonary ROS, former smoker,    breath sounds clear to auscultation       Cardiovascular hypertension, + CAD and + CABG   Rhythm:Regular Rate:Normal  Study Conclusions  - Left ventricle: The cavity size was mildly dilated. There was   mild concentric hypertrophy. Systolic function was normal. The   estimated ejection fraction was in the range of 50% to 55%. Poor   visualization of the endomyocardial border limits the accuracy of   wall motion and LVEF assessment. Patient refuse contrast. Wall   motion was normal; there were no regional wall motion   abnormalities.    Neuro/Psych negative neurological ROS  negative psych ROS   GI/Hepatic Neg liver ROS, GERD  ,  Endo/Other  diabetesHypothyroidism Morbid obesity  Renal/GU Renal InsufficiencyRenal disease     Musculoskeletal   Abdominal   Peds  Hematology   Anesthesia Other Findings   Reproductive/Obstetrics                            Anesthesia Physical  Anesthesia Plan  ASA: III  Anesthesia Plan: General   Post-op Pain Management:    Induction: Intravenous  PONV Risk Score and Plan: 3 and Ondansetron, Dexamethasone and Diphenhydramine  Airway Management Planned: LMA  Additional Equipment:   Intra-op Plan:   Post-operative Plan: Extubation in OR  Informed Consent: I have reviewed the patients History and Physical, chart, labs and discussed the procedure including the risks, benefits and alternatives for the proposed anesthesia  with the patient or authorized representative who has indicated his/her understanding and acceptance.   Dental advisory given  Plan Discussed with: CRNA and Anesthesiologist  Anesthesia Plan Comments:         Anesthesia Quick Evaluation

## 2017-11-05 NOTE — Op Note (Signed)
Procedure: Excisional debridement of left groin placement of VAC dressing  Preoperative diagnosis: Poorly healing left groin wound  Postoperative diagnosis: Same  Anesthesia: General  Assistant: Leontine Locket, PA-C  Operative findings: Fat necrosis small hematoma left groin  Specimens: Cultures from left groin  Operative details: After obtaining informed consent, the patient was taken the operating.  The patient was placed in supine position operating table.  After induction of general anesthesia and endotracheal intubation, the patient's entire left groin was prepped and draped in usual sterile fashion.  Next an elliptical incision was made around an area of infarcted skin primarily at the superior pole of the incision.  This was carried down to healthy skin edges that were bleeding.  I then proceeded to debride the deeper tissues of the groin and the subcutaneous and fat tissues were divided back to healthy appearing bleeding tissue.  There was a large amount of fat necrosis.  There was a small amount of hematoma approximately 15 cc.  This was thoroughly evacuated.  The wound was then irrigated with 6 L of normal saline solution.  The graft could be palpated at the base of the incision but this was not fully exposed.  Several interrupted 2 oh figure-of-eight sutures were placed to provide better coverage of the graft.  A VAC dressing was then applied to the left groin.  This was placed on 125 mmHg suction.  The patient tolerated procedure well and there were no complications.  The instrument sponge and needle count was correct at the end of the case.  The patient was taken to the recovery room in stable condition.  Ruta Hinds, MD [facility] [place] Office: [phone] Pager: [phone]

## 2017-11-05 NOTE — Progress Notes (Signed)
Pharmacy Antibiotic Note [name] is a 77 y.o. male admitted on 11/04/2017 with concern for left groin infection following left common femoral endarterectomy and left femoral to below-knee popliteal on 06/03. Plan for debridement in OR on 6/14. Currently on day 2 of Zosyn and vancomycin for treatment.   Plan: 1. Vancomycin 1250 mg IV every 24 hours.  Goal trough 15-20 mcg/mL. 2. Zosyn 3.375g IV q8h (4 hour infusion).  3. BMP every 72 hours while on vancomycin.   Weight: 272 lb 0.8 oz (123.4 kg)  Temp (24hrs), Avg:98.7 F (37.1 C), Min:98.1 F (36.7 C), Max:99.6 F (37.6 C)  Recent Labs  Lab 10/30/17 0729 11/04/17 1819  WBC 5.7 9.5  CREATININE 1.48* 1.39*    Estimated Creatinine Clearance: 58.5 mL/min (A) (by C-G formula based on SCr of 1.39 mg/dL (H)).    Antimicrobials this admission: 6/13 Zosyn >>  6/13 vancomycin >>   Microbiology results: 6/14 MRSA PCR: negative  Thank you for allowing pharmacy to be a part of this patient's care.  Vincenza Hews, PharmD, BCPS 11/05/2017, 11:21 AM

## 2017-11-05 NOTE — Plan of Care (Signed)
  Problem: Education: Goal: Knowledge of General Education information will improve Outcome: Progressing   Problem: Clinical Measurements: Goal: Cardiovascular complication will be avoided Outcome: Progressing   Problem: Pain Managment: Goal: General experience of comfort will improve Outcome: Progressing

## 2017-11-05 NOTE — H&P (Signed)
Patient is a 77 year old male who returns for postoperative follow-up today.  He underwent left common femoral endarterectomy and left femoral to below-knee popliteal bypass with vein 10 days ago.  [facility]  He is having some bloody drainage from his left groin.  He denies any fever or chills.  He is incontinent of urine and stool frequently.  Physical exam:     Vitals:   11/04/17 1529  BP: (!) 154/58  Pulse: 65  Resp: 16  Temp: 98.8 F (37.1 C)  TempSrc: Oral  SpO2: 97%  Weight: 251 lb (113.9 kg)  Height: 5\' 11"  (1.803 m)    Extremities: Left groin dark old appearing blood expressible from a 2 mm opening in the crease of his groin with some superficial skin necrosis adjacent to this.  No obvious erythema.  Staples are intact down the remainder of the leg with some peri-staple inflammation no obvious hematoma or erythema.  2+ left dorsalis pedis pulse.  Gangrenous tip of left first toe and ankle area are still dry and healing.  Assessment: Poorly healing groin left leg high risk for aortic graft infection  Plan: Admit today for broad-spectrum IV antibiotics.  Plan for left groin debridement in the operating room tomorrow.  Risk benefits possible complications including infection of the aortic graft were all explained to the patient and his family today.  Ruta Hinds, MD [facility] [place] Office: [phone] Pager: [phone]

## 2017-11-05 NOTE — NC FL2 (Signed)
[place] LEVEL OF CARE SCREENING TOOL     IDENTIFICATION  Patient Name: [name] Birthdate: December 04, 1940 Sex: male Admission Date (Current Location): 11/04/2017  [facility] and [place] Number:  Herbalist and Address:  [facility], [place] [address], [place], [place] [address]      Provider Number: [id number]  Attending Physician Name and Address:  Elam Dutch, MD  Relative Name and Phone Number:  Avigdor Dollar, spouse, [phone]    Current Level of Care: Hospital Recommended Level of Care: [place] Prior Approval Number:    Date Approved/Denied:   PASRR Number: [id number]  Discharge Plan: SNF    Current Diagnoses: Patient Active Problem List   Diagnosis Date Noted  . Surgical wound infection 11/04/2017  . PAD (peripheral artery disease) ([place]) 10/25/2017  . Polyneuropathy associated with underlying disease ([place]) 08/16/2017  . Essential hypertension 02/06/2014  . Chronic diastolic heart failure ([place]) 02/06/2014  . Chronic kidney disease, stage III (moderate) ([place]) 02/06/2014  . Aftercare following surgery of the circulatory system, [place] 01/25/2014  . Carotid stenosis 06/08/2013  . Stricture of artery ([place]) 04/14/2012  . Peripheral vascular disease, unspecified ([place]) 10/08/2011  . DVT of upper extremity (deep vein thrombosis), left 09/29/2011  . S/P CABG (coronary artery bypass graft) 09/29/2011  . H/O carotid endarterectomy 09/29/2011  . Occlusion and stenosis of carotid artery without mention of cerebral infarction 06/11/2011  . Diabetes mellitus type 2 with complications ([place]) 56/97/9480    Orientation RESPIRATION BLADDER Height & Weight     Self, Time, Situation, Place  Normal Incontinent, Indwelling catheter Weight: 272 lb 0.8 oz (123.4 kg) Height:     BEHAVIORAL SYMPTOMS/MOOD NEUROLOGICAL BOWEL NUTRITION STATUS      Continent Diet(please see DC summary)  AMBULATORY STATUS  COMMUNICATION OF NEEDS Skin   Extensive Assist Verbally PU Stage and Appropriate Care, Other (Comment)(closed wounds - L groin, L leg, L thigh, R hand; open wounds L foot and L great toe)                       Personal Care Assistance Level of Assistance  Bathing, Feeding, Dressing Bathing Assistance: Maximum assistance Feeding assistance: Limited assistance Dressing Assistance: Maximum assistance     Functional Limitations Info  Sight, Hearing, Speech Sight Info: Adequate Hearing Info: Adequate Speech Info: Adequate    SPECIAL CARE FACTORS FREQUENCY  PT (By licensed PT), OT (By licensed OT)     PT Frequency: 5x/week OT Frequency: 5x/week            Contractures Contractures Info: Not present    Additional Factors Info  Code Status, Allergies, Insulin Sliding Scale Code Status Info: Full Allergies Info: [phone]   Insulin Sliding Scale Info: novolog 3x/day with meals       Current Medications (11/05/2017):  This is the current hospital active medication list Current Facility-Administered Medications  Medication Dose Route Frequency Provider Last Rate Last Dose  . 0.9 %  sodium chloride infusion   Intravenous Continuous Dagoberto Ligas, PA-C 50 mL/hr at 11/04/17 2202    . acetaminophen (TYLENOL) tablet 325-650 mg  325-650 mg Oral Q4H PRN Dagoberto Ligas, PA-C       Or  . acetaminophen (TYLENOL) suppository 325-650 mg  325-650 mg Rectal Q4H PRN Dagoberto Ligas, PA-C      . alum & mag hydroxide-simeth (MAALOX/MYLANTA) 200-200-20 MG/5ML suspension 15-30 mL  15-30 mL Oral Q2H PRN Dagoberto Ligas, PA-C      .  docusate sodium (COLACE) capsule 100 mg  100 mg Oral Daily Waynetta Sandy, MD      . guaiFENesin-dextromethorphan [facility] DM) 100-10 MG/5ML syrup 15 mL  15 mL Oral Q4H PRN Dagoberto Ligas, PA-C      . heparin injection 5,000 Units  5,000 Units Subcutaneous Q8H Dagoberto Ligas, PA-C   5,000 Units at 11/04/17 2219  . hydrALAZINE (APRESOLINE)  injection 5 mg  5 mg Intravenous Q20 Min PRN Dagoberto Ligas, PA-C      . HYDROmorphone (DILAUDID) injection 0.5-1 mg  0.5-1 mg Intravenous Q2H PRN Dagoberto Ligas, PA-C      . insulin aspart (novoLOG) injection 0-15 Units  0-15 Units Subcutaneous TID WC Dagoberto Ligas, PA-C   3 Units at 11/05/17 0843  . labetalol (NORMODYNE,TRANDATE) injection 10 mg  10 mg Intravenous Q10 min PRN Dagoberto Ligas, PA-C      . metoprolol tartrate (LOPRESSOR) injection 2-5 mg  2-5 mg Intravenous Q2H PRN Dagoberto Ligas, PA-C      . ondansetron [facility]) injection 4 mg  4 mg Intravenous Q6H PRN Dagoberto Ligas, PA-C   4 mg at 11/05/17 0012  . oxyCODONE-acetaminophen (PERCOCET/ROXICET) 5-325 MG per tablet 1-2 tablet  1-2 tablet Oral Q4H PRN Dagoberto Ligas, PA-C      . pantoprazole (PROTONIX) EC tablet 40 mg  40 mg Oral Daily Dagoberto Ligas, PA-C   40 mg at 11/05/17 0842  . phenol (CHLORASEPTIC) mouth spray 1 spray  1 spray Mouth/Throat PRN Dagoberto Ligas, PA-C      . piperacillin-tazobactam (ZOSYN) IVPB 3.375 g  3.375 g Intravenous Q8H Carney, Jessica C, RPH 12.5 mL/hr at 11/05/17 0618 3.375 g at 11/05/17 0618  . potassium chloride SA (K-DUR,KLOR-CON) CR tablet 20-40 mEq  20-40 mEq Oral Once Dagoberto Ligas, PA-C      . vancomycin (VANCOCIN) 1,250 mg in sodium chloride 0.9 % 250 mL IVPB  1,250 mg Intravenous Q24H Carney, Gay Filler, [facility]         Discharge Medications: Please see discharge summary for a list of discharge medications.  Relevant Imaging Results:  Relevant Lab Results:   Additional Information SSN: [id number]  Estanislado Emms, LCSW

## 2017-11-05 NOTE — Transfer of Care (Signed)
Immediate Anesthesia Transfer of Care Note  Patient: [name]  Procedure(s) Performed: DEBRIDEMENT WOUND LEFT GROIN with wound vac placement (Left Groin)  Patient Location: PACU  Anesthesia Type:General  Level of Consciousness: awake, alert  and oriented  Airway & Oxygen Therapy: Patient Spontanous Breathing and Patient connected to nasal cannula oxygen  Post-op Assessment: Report given to RN and Post -op Vital signs reviewed and stable  Post vital signs: Reviewed and stable  Last Vitals:  Vitals Value Taken Time  BP 132/47 11/05/2017  2:13 PM  Temp    Pulse 62 11/05/2017  2:14 PM  Resp 13 11/05/2017  2:14 PM  SpO2 100 % 11/05/2017  2:14 PM  Vitals shown include unvalidated device data.  Last Pain:  Vitals:   11/05/17 1130  TempSrc:   PainSc: 6       Patients Stated Pain Goal: 2 (35/07/57 3225)  Complications: No apparent anesthesia complications

## 2017-11-05 NOTE — Anesthesia Postprocedure Evaluation (Signed)
Anesthesia Post Note  Patient: [name]  Procedure(s) Performed: DEBRIDEMENT WOUND LEFT GROIN with wound vac placement (Left Groin)     Patient location during evaluation: PACU Anesthesia Type: General Level of consciousness: sedated Pain management: pain level controlled Vital Signs Assessment: post-procedure vital signs reviewed and stable Respiratory status: spontaneous breathing and respiratory function stable Cardiovascular status: stable Postop Assessment: no apparent nausea or vomiting Anesthetic complications: no                    [name] DANIEL

## 2017-11-05 NOTE — Progress Notes (Signed)
Patient became confused tonight. Alert to self only. Asking for wife. Called wife and verified with her that the patient has been confused at night since his surgery. Wife arrived to stay the night with patient.

## 2017-11-05 NOTE — Clinical Social Work Note (Signed)
Clinical Social Work Assessment  Patient Details  Name: [name] MRN: [id number] Date of Birth: Mar 11, 1941  Date of referral:  11/05/17               Reason for consult:  Facility Placement, Discharge Planning                Permission sought to share information with:  Facility Sport and exercise psychologist, Family Supports Permission granted to share information::  Yes, Verbal Permission Granted  Name::     Michaell Grider  Agency::  [facility]  Relationship::  wife  Contact Information:  [phone]  Housing/Transportation Living arrangements for the past 2 months:  [place], [place] of Information:  Patient, Adult Children Patient Interpreter Needed:  None Criminal Activity/Legal Involvement Pertinent to Current Situation/Hospitalization:  No - Comment as needed Significant Relationships:  Adult Children, Spouse Lives with:  Spouse Do you feel safe going back to the place where you live?  Yes Need for family participation in patient care:  No (Coment)  Care giving concerns:  Patient originally from home with wife. However readmitted from [facility], where he was for rehab. Needs PT/OT evaluation to return to the SNF.   Social Worker assessment / plan: CSW met with patient and son in law at bedside. Patient alert and oriented. CSW introduced self and role and discussed disposition planning. Patient reported he was only at [facility] for two days before returning to the hospital. Patient would like to return to [facility] and continue his rehab.  CSW discussed process of discharge back to [facility] - when appropriate, patient will have to have new PT/OT evaluation here at the hospital in order to qualify to return to SNF. [facility] will need to get new [employer] authorization for readmit to their facility - patient will require authorization before he can return to facility.  CSW sent updates to [facility]. CSW to follow for medical readiness and will  support with discharge planning.  Employment status:  Retired Forensic scientist:  Managed Medicare([employer]) PT Recommendations:  Not assessed at this time Information / Referral to community resources:  [place]  Patient/Family's Response to care: Patient and family appreciative of care.  Patient/Family's Understanding of and Emotional Response to Diagnosis, Current Treatment, and Prognosis:  Patient and family with understanding of conditions and hopeful to return to SNF.  Emotional Assessment Appearance:  Appears stated age Attitude/Demeanor/Rapport:  Engaged Affect (typically observed):  Accepting, Calm, Pleasant Orientation:  Oriented to Self, Oriented to Place, Oriented to  Time, Oriented to Situation Alcohol / Substance use:  Not Applicable Psych involvement (Current and /or in the community):  No (Comment)  Discharge Needs  Concerns to be addressed:  Discharge Planning Concerns, Care Coordination Readmission within the last 30 days:  Yes Current discharge risk:  Physical Impairment Barriers to Discharge:  Continued Medical Work up, [place], LCSW 11/05/2017, 10:46 AM

## 2017-11-05 NOTE — Anesthesia Procedure Notes (Signed)
Procedure Name: LMA Insertion Date/Time: 11/05/2017 1:00 PM Performed by: Candis Shine, CRNA Pre-anesthesia Checklist: Patient identified, Emergency Drugs available, Suction available and Patient being monitored Patient Re-evaluated:Patient Re-evaluated prior to induction Oxygen Delivery Method: Circle System Utilized Preoxygenation: Pre-oxygenation with 100% oxygen Induction Type: IV induction Ventilation: Mask ventilation without difficulty LMA: LMA inserted LMA Size: 5.0 Number of attempts: 1 Placement Confirmation: positive ETCO2 Tube secured with: Tape Dental Injury: Teeth and Oropharynx as per pre-operative assessment

## 2017-11-06 ENCOUNTER — Encounter (HOSPITAL_COMMUNITY): Payer: Self-pay | Admitting: Vascular Surgery

## 2017-11-06 LAB — BASIC METABOLIC PANEL
Anion gap: 9 (ref 5–15)
BUN: 34 mg/dL — ABNORMAL HIGH (ref 6–20)
CHLORIDE: 101 mmol/L (ref 101–111)
CO2: 21 mmol/L — AB (ref 22–32)
CREATININE: 1.49 mg/dL — AB (ref 0.61–1.24)
Calcium: 7.9 mg/dL — ABNORMAL LOW (ref 8.9–10.3)
GFR calc non Af Amer: 43 mL/min — ABNORMAL LOW (ref >60)
GFR, EST AFRICAN AMERICAN: 50 mL/min — AB (ref >60)
Glucose, Bld: 229 mg/dL — ABNORMAL HIGH (ref 65–99)
Potassium: 4.8 mmol/L (ref 3.5–5.1)
Sodium: 131 mmol/L — ABNORMAL LOW (ref 135–145)

## 2017-11-06 LAB — GLUCOSE, CAPILLARY
GLUCOSE-CAPILLARY: 212 mg/dL — AB (ref 65–99)
GLUCOSE-CAPILLARY: 154 mg/dL — AB (ref 65–99)
Glucose-Capillary: 231 mg/dL — ABNORMAL HIGH (ref 65–99)
Glucose-Capillary: 184 mg/dL — ABNORMAL HIGH (ref 65–99)

## 2017-11-06 MED ORDER — ASPIRIN EC 325 MG PO TBEC: 325.0000 mg | DELAYED_RELEASE_TABLET | Freq: Every evening | ORAL | Status: DC

## 2017-11-06 MED ORDER — FENOFIBRATE 54 MG PO TABS
54.0000 mg | ORAL_TABLET | Freq: Every day | ORAL | Status: DC
  Administered 2017-11-06 – 2017-12-03 (×28): 54 mg via ORAL
  Filled 2017-11-06 (×28): qty 1

## 2017-11-06 MED ORDER — ADULT MULTIVITAMIN W/MINERALS CH
1.0000 | ORAL_TABLET | Freq: Every day | ORAL | Status: DC
  Administered 2017-11-06 – 2017-12-03 (×28): 1 via ORAL
  Filled 2017-11-06 (×28): qty 1

## 2017-11-06 MED ORDER — POLYVINYL ALCOHOL 1.4 % OP SOLN
1.0000 [drp] | Freq: Two times a day (BID) | OPHTHALMIC | Status: DC | PRN
  Filled 2017-11-06: qty 15

## 2017-11-06 MED ORDER — TEMAZEPAM 7.5 MG PO CAPS
15.0000 mg | ORAL_CAPSULE | Freq: Every evening | ORAL | Status: DC | PRN
  Administered 2017-11-06 – 2017-11-14 (×4): 15 mg via ORAL
  Filled 2017-11-06 (×5): qty 2

## 2017-11-06 MED ORDER — GABAPENTIN 300 MG PO CAPS
300.0000 mg | ORAL_CAPSULE | Freq: Every day | ORAL | Status: DC
Start: 2017-11-06 — End: 2017-12-03
  Administered 2017-11-06 – 2017-12-02 (×27): 300 mg via ORAL
  Filled 2017-11-06 (×27): qty 1

## 2017-11-06 MED ORDER — FUROSEMIDE 40 MG PO TABS
40.0000 mg | ORAL_TABLET | Freq: Every day | ORAL | Status: DC | PRN
  Administered 2017-11-06: 40 mg via ORAL
  Filled 2017-11-06: qty 1

## 2017-11-06 MED ORDER — PIOGLITAZONE HCL 30 MG PO TABS
30.0000 mg | ORAL_TABLET | Freq: Every day | ORAL | Status: DC
Start: 2017-11-06 — End: 2017-12-03
  Administered 2017-11-06 – 2017-12-02 (×24): 30 mg via ORAL
  Filled 2017-11-06 (×28): qty 1

## 2017-11-06 MED ORDER — NITROGLYCERIN 0.4 MG SL SUBL: 0.4000 mg | SUBLINGUAL_TABLET | SUBLINGUAL | Status: DC | PRN

## 2017-11-06 MED ORDER — METHYLCELLULOSE 1 % OP SOLN: 1.0000 [drp] | Freq: Two times a day (BID) | OPHTHALMIC | Status: DC | PRN

## 2017-11-06 MED ORDER — GABAPENTIN 300 MG PO CAPS: 300.0000 mg | ORAL_CAPSULE | ORAL | Status: DC

## 2017-11-06 MED ORDER — ATORVASTATIN CALCIUM 20 MG PO TABS
20.0000 mg | ORAL_TABLET | Freq: Every evening | ORAL | Status: DC
  Administered 2017-11-06 – 2017-12-02 (×27): 20 mg via ORAL
  Filled 2017-11-06 (×27): qty 1

## 2017-11-06 MED ORDER — ASPIRIN EC 325 MG PO TBEC
325.0000 mg | DELAYED_RELEASE_TABLET | Freq: Every day | ORAL | Status: DC
  Administered 2017-11-06 – 2017-12-02 (×27): 325 mg via ORAL
  Filled 2017-11-06 (×27): qty 1

## 2017-11-06 MED ORDER — LEVOTHYROXINE SODIUM 50 MCG PO TABS
50.0000 ug | ORAL_TABLET | Freq: Every day | ORAL | Status: DC
  Administered 2017-11-06 – 2017-12-03 (×27): 50 ug via ORAL
  Filled 2017-11-06 (×28): qty 1

## 2017-11-06 MED ORDER — PSEUDOEPHEDRINE-GUAIFENESIN ER 60-600 MG PO TB12: 1.0000 | ORAL_TABLET | Freq: Two times a day (BID) | ORAL | Status: DC | PRN

## 2017-11-06 MED ORDER — LORATADINE 10 MG PO TABS
10.0000 mg | ORAL_TABLET | Freq: Every day | ORAL | Status: DC
  Administered 2017-11-06 – 2017-12-03 (×26): 10 mg via ORAL
  Filled 2017-11-06 (×29): qty 1

## 2017-11-06 MED ORDER — PRO-STAT SUGAR FREE PO LIQD
30.0000 mL | Freq: Two times a day (BID) | ORAL | Status: DC
  Administered 2017-11-06 – 2017-11-18 (×15): 30 mL via ORAL
  Filled 2017-11-06 (×20): qty 30

## 2017-11-06 MED ORDER — OXYMETAZOLINE HCL 0.05 % NA SOLN
1.0000 | Freq: Two times a day (BID) | NASAL | Status: DC | PRN
  Filled 2017-11-06: qty 15

## 2017-11-06 MED ORDER — METOPROLOL TARTRATE 50 MG PO TABS
50.0000 mg | ORAL_TABLET | Freq: Two times a day (BID) | ORAL | Status: DC
  Administered 2017-11-06 – 2017-12-03 (×51): 50 mg via ORAL
  Filled 2017-11-06 (×54): qty 1

## 2017-11-06 MED ORDER — GABAPENTIN 300 MG PO CAPS
600.0000 mg | ORAL_CAPSULE | Freq: Every day | ORAL | Status: DC
  Administered 2017-11-06 – 2017-12-03 (×28): 600 mg via ORAL
  Filled 2017-11-06 (×28): qty 2

## 2017-11-06 MED ORDER — HYDROCODONE-ACETAMINOPHEN 5-325 MG PO TABS
1.0000 | ORAL_TABLET | Freq: Four times a day (QID) | ORAL | Status: DC | PRN
  Administered 2017-11-06 – 2017-12-02 (×31): 1 via ORAL
  Filled 2017-11-06 (×34): qty 1

## 2017-11-06 MED ORDER — DM-GUAIFENESIN ER 30-600 MG PO TB12
1.0000 | ORAL_TABLET | Freq: Two times a day (BID) | ORAL | Status: DC | PRN
  Administered 2017-11-11: 1 via ORAL
  Filled 2017-11-06 (×2): qty 1

## 2017-11-06 NOTE — Plan of Care (Signed)
  Problem: Education: Goal: Knowledge of General Education information will improve Outcome: Progressing   Problem: Clinical Measurements: Goal: Will remain free from infection Outcome: Progressing Goal: Cardiovascular complication will be avoided Outcome: Progressing   Problem: Activity: Goal: Risk for activity intolerance will decrease Outcome: Progressing   Problem: Pain Managment: Goal: General experience of comfort will improve Outcome: Progressing   Problem: Safety: Goal: Ability to remain free from injury will improve Outcome: Progressing

## 2017-11-06 NOTE — Progress Notes (Signed)
Initial Nutrition Assessment  DOCUMENTATION CODES:   Obesity unspecified  INTERVENTION:   Continue Ensure Enlive po BID, each supplement provides 350 kcal and 20 grams of protein  Pro-Stat 30 mL BID  Add MVI with minerals daily  Provide education on wound healing and DM on follow-up   NUTRITION DIAGNOSIS:   Increased nutrient needs related to wound healing as evidenced by estimated needs.  GOAL:   Patient will meet greater than or equal to 90% of their needs   MONITOR:   PO intake, Supplement acceptance, Labs, Weight trends  REASON FOR ASSESSMENT:   Consult Diet education  ASSESSMENT:   77 yo male post left common femoral endarterectomy and left femoral to below-knee popliteal bypass with vein 10 days ago admitted with poorly healing left groin. Pt with hx of DM, CAD s/p CABG, HTN, CHF, CKD III, PAD  6/14 excisional debridement of left groin with placement of VAC dressing  Pt not available on visit today, unable to complete physical exam Recorded po intake 30-50% of meals.   Per chart review, pt seen by RD in 2013 and weighed 126.6 kg, current wt 123 kg. Indicating no major changes in weight over the last several years.   Labs: sodium 131, Creatinine 1.49, BUN 34 Meds: NS at 50 ml/hr  NUTRITION - FOCUSED PHYSICAL EXAM: Unable to complete  Diet Order:   Diet Order           Diet regular Room service appropriate? Yes; Fluid consistency: Thin  Diet effective now          EDUCATION NEEDS:   Not appropriate for education at this time  Skin:  Skin Assessment: Skin Integrity Issues: Skin Integrity Issues:: Wound VAC, Other (Comment) Wound Vac: groin Other: venous stasis ulcers on foot, toe  Last BM:  6/14  Height:   Ht Readings from Last 1 Encounters:  11/05/17 5\' 11"  (1.803 m)    Weight:   Wt Readings from Last 1 Encounters:  11/05/17 272 lb (123.4 kg)    Ideal Body Weight:  78.2 kg  BMI:  Body mass index is 37.94 kg/m.  Estimated  Nutritional Needs:   Kcal:  2000-2200 kcals  Protein:  115-130 g  Fluid:  >/= 2 L    Kerman Passey MS, RD, LDN, CNSC [phone] Pager  [phone] Weekend/On-Call Pager

## 2017-11-06 NOTE — Evaluation (Addendum)
Physical Therapy Evaluation Patient Details Name: [name] MRN: [id number] DOB: 11-27-1940 Today's Date: 11/06/2017   History of Present Illness  77 y.o. patient who underwent left common femoral endarterectomy and left femoral to below-knee popliteal bypass 6/03, discharged to SNF, returns for follow up with left groin wound infection, debrided and wound vac placed 6/14. PMH includes: foot drop L, DM, peripheral neuropathy, PVD, L food wounds, B RTC repair     Clinical Impression  Patient is s/p above surgery resulting in functional limitations due to the deficits listed below (see PT Problem List). PTA, pt recovering at SNF. Patient as undergone a functional decline over last 4 months with multiple hospitalizations and procedures. Prior to January, patient ambulating with [facility], former Paramedic attorney. Upon eval pt presents with high levels of pain that greatly limits his mobility. Unable to progress OOB this visit, seated EOB and focused on bed level therex. Patient spoke highly of his short stay at [facility] where he felt he was finally getting on track to recovering to baseline. Will cont to follow and progress as tolerated, patient eager to return to SNF once medically ready and continue rehab.   Patient will benefit from skilled PT to increase their independence and safety with mobility to allow discharge to the venue listed below.        Follow Up Recommendations SNF;Supervision/Assistance - 24 hour    Equipment Recommendations  (TBD at next venue)    Recommendations for Other Services       Precautions / Restrictions Precautions Precautions: Fall Restrictions LLE Weight Bearing: Weight bearing as tolerated      Mobility  Bed Mobility Overal bed mobility: Needs Assistance Bed Mobility: Supine to Sit;Sit to Supine     Supine to sit: +2 for physical assistance;Max assist Sit to supine: Max assist;+2 for physical assistance   General bed mobility comments:  pt requiring max A x2 to supine to sit due to pain, trunk support, moving L leg over EOB  Transfers                 General transfer comment: unable to this visit 2/2 pain   Ambulation/Gait                Stairs            Wheelchair Mobility    Modified Rankin (Stroke Patients Only)       Balance     Sitting balance-Leahy Scale: Fair         Standing balance comment: N/T                             Pertinent Vitals/Pain Pain Assessment: Faces Faces Pain Scale: Hurts whole lot Pain Location: L groin Pain Descriptors / Indicators: Grimacing;Moaning;Guarding;Constant Pain Intervention(s): Monitored during session;Limited activity within patient's tolerance;Premedicated before session;Repositioned    Home Living Family/patient expects to be discharged to:: Skilled nursing facility                      Prior Function Level of Independence: Needs assistance   Gait / Transfers Assistance Needed: RW for functional mobility     Comments: Patient was rehab[facility] at [facility]     Hand Dominance   Dominant Hand: Right    Extremity/Trunk Assessment   Upper Extremity Assessment Upper Extremity Assessment: Defer to OT evaluation;Generalized weakness    Lower Extremity Assessment Lower Extremity Assessment: Generalized weakness  Communication   Communication: No difficulties  Cognition Arousal/Alertness: Awake/alert Behavior During Therapy: Anxious Overall Cognitive Status: Within Functional Limits for tasks assessed                                 General Comments: family describes slow subtle decline in cognitive fuction over last year, AOx4 and following commands, not lethargic this visit      General Comments      Exercises General Exercises - Lower Extremity Ankle Circles/Pumps: 10 reps Quad Sets: 10 reps Long Arc Quad: 10 reps Heel Slides: 15 reps Hip ABduction/ADduction: 15 reps Straight Leg  Raises: 15 reps   Assessment/Plan    PT Assessment Patient needs continued PT services  PT Problem List Decreased strength;Decreased range of motion;Decreased activity tolerance;Decreased balance;Decreased mobility;Decreased coordination;Decreased cognition;Decreased knowledge of use of DME;Decreased safety awareness;Pain       PT Treatment Interventions DME instruction;Functional mobility training;Stair training;Gait training;Therapeutic activities;Therapeutic exercise;Balance training    PT Goals (Current goals can be found in the Care Plan section)  Acute Rehab PT Goals Patient Stated Goal: Decrease pain PT Goal Formulation: With patient/family Time For Goal Achievement: 11/13/17 Potential to Achieve Goals: Good    Frequency Min 2X/week   Barriers to discharge        Co-evaluation               AM-PAC PT "6 Clicks" Daily Activity  Outcome Measure Difficulty turning over in bed (including adjusting bedclothes, sheets and blankets)?: Unable Difficulty moving from lying on back to sitting on the side of the bed? : Unable Difficulty sitting down on and standing up from a chair with arms (e.g., wheelchair, bedside commode, etc,.)?: Unable Help needed moving to and from a bed to chair (including a wheelchair)?: Total Help needed walking in hospital room?: Total Help needed climbing 3-5 steps with a railing? : Total 6 Click Score: 6    End of Session Equipment Utilized During Treatment: Gait belt Activity Tolerance: Patient limited by pain;Patient limited by fatigue Patient left: in bed;with call bell/phone within reach;with family/visitor present Nurse Communication: Mobility status PT Visit Diagnosis: Unsteadiness on feet (R26.81);Pain Pain - Right/Left: Left Pain - part of body: Hip    Time: 1810-1900 PT Time Calculation (min) (ACUTE ONLY): 50 min   Charges:   PT Evaluation $PT Eval Moderate Complexity: 1 Mod PT Treatments $Therapeutic Exercise: 8-22  mins $Therapeutic Activity: 8-22 mins   PT G Codes:        Reinaldo Berber, PT, DPT Acute Rehab Services Pager: [phone]    Reinaldo Berber 11/06/2017, 9:56 PM

## 2017-11-06 NOTE — Progress Notes (Signed)
Transferred patient to bariatric toilet with 3-4 person 75% assist. Patient is very weak and intolerant of being able to sit on BSC and "push to have bowel movement or even sit at all because of my incision" and points to L groin area. He states pain is 10/10 and cannot tolerate sitting in chair either. He appears agitated and frustrated when trying to transfer as he cannot move his feet well nor bear much weight.

## 2017-11-06 NOTE — Progress Notes (Addendum)
  Progress Note    11/06/2017 8:36 AM 1 Day Post-Op  Subjective:  Sitting up eating breakfast; says it's pretty tasty.    Afebrile HR 50's-70's  458'K-998'P systolic 38% [id number]  Vitals:   11/06/17 0529 11/06/17 0826  BP: (!) 143/56 (!) 135/57  Pulse:    Resp: 15 16  Temp: 97.7 F (36.5 C) 97.8 F (36.6 C)  SpO2: 96% 98%    Physical Exam: General:  Looks good this morning; alert Cardiac:  regular Lungs:  Non labored Incisions:  Left groin with wound vac with good seal; leg incisions clean with staples in tact Extremities:  Edema LLE; easily palpable DP pulse left; left lateral foot wound and left great toe dry eschar   CBC    Component Value Date/Time   WBC 9.5 11/04/2017 1819   RBC 3.47 (L) 11/04/2017 1819   HGB 10.3 (L) 11/04/2017 1819   HCT 32.6 (L) 11/04/2017 1819   PLT 319 11/04/2017 1819   MCV 93.9 11/04/2017 1819   MCH 29.7 11/04/2017 1819   MCHC 31.6 11/04/2017 1819   RDW 17.0 (H) 11/04/2017 1819   LYMPHSABS 0.7 10/28/2017 2219   MONOABS 0.8 10/28/2017 2219   EOSABS 0.1 10/28/2017 2219   BASOSABS 0.0 10/28/2017 2219    BMET    Component Value Date/Time   NA 131 (L) 11/06/2017 0206   K 4.8 11/06/2017 0206   CL 101 11/06/2017 0206   CO2 21 (L) 11/06/2017 0206   GLUCOSE 229 (H) 11/06/2017 0206   BUN 34 (H) 11/06/2017 0206   CREATININE 1.49 (H) 11/06/2017 0206   CREATININE 1.32 04/18/2012 0949   CALCIUM 7.9 (L) 11/06/2017 0206   GFRNONAA 43 (L) 11/06/2017 0206   GFRAA 50 (L) 11/06/2017 0206    INR    Component Value Date/Time   INR 1.20 11/04/2017 1819     Intake/Output Summary (Last 24 hours) at 11/06/2017 0836 Last data filed at 11/06/2017 0659 Gross per 24 hour  Intake 1360 ml  Output 650 ml  Net 710 ml   Component 1d ago  Specimen Description GROIN   Special Requests NONE   Gram Stain MODERATE WBC PRESENT,BOTH PMN AND MONONUCLEAR  FEW GRAM VARIABLE ROD  Performed at [facility], [address], [place], [place]  [address]     Culture PENDING   Report Status PENDING     Assessment:  77 y.o. male is s/p:  Excisional debridement of left groin placement of VAC dressing  1 Day Post-Op  Plan: -pt with left groin wound vac with good seal; other incisions clean and dry with staples in tact -gram stain with moderate WBC and few gram variable rods-culture is pending.  Continue current abx.  Pt is afebrile -doing well with IS; needs to be oob to chair tid with meals and hopefully can work with PT today.   -pt can get up to [facility] -DVT prophylaxis:  SQ heparin  -home meds re-ordered -stressed protein in take for wound healing   Leontine Locket, PA-C [facility] [phone] 11/06/2017 8:36 AM   I have interviewed and examined patient with PA and agree with assessment and plan above.   Annia Gomm C. Donzetta Matters, MD [facility] [place] Office: [phone] Pager: [phone]

## 2017-11-07 LAB — GLUCOSE, CAPILLARY
GLUCOSE-CAPILLARY: 131 mg/dL — AB (ref 65–99)
GLUCOSE-CAPILLARY: 142 mg/dL — AB (ref 65–99)
Glucose-Capillary: 122 mg/dL — ABNORMAL HIGH (ref 65–99)
Glucose-Capillary: 170 mg/dL — ABNORMAL HIGH (ref 65–99)
Glucose-Capillary: 178 mg/dL — ABNORMAL HIGH (ref 65–99)

## 2017-11-07 NOTE — Plan of Care (Signed)
  Problem: Education: Goal: Knowledge of General Education information will improve Outcome: Progressing   Problem: Clinical Measurements: Goal: Will remain free from infection Outcome: Progressing Goal: Cardiovascular complication will be avoided Outcome: Progressing   Problem: Pain Managment: Goal: General experience of comfort will improve Outcome: Progressing   Problem: Safety: Goal: Ability to remain free from injury will improve Outcome: Progressing   Problem: Skin Integrity: Goal: Risk for impaired skin integrity will decrease Outcome: Progressing

## 2017-11-07 NOTE — Progress Notes (Addendum)
  Progress Note    11/07/2017 8:31 AM 2 Days Post-Op  Subjective:  Sitting up in bed eating breakfast; does not want the Pro-Stat but wants the Atkins shakes  Afebrile HR 50's-60's NSR 056'P-794'I systolic 01%  Vitals:   65/53/74 2346 11/07/17 0409  BP: 130/76 112/67  Pulse:    Resp: 20 20  Temp: 98 F (36.7 C) 97.7 F (36.5 C)  SpO2:      Physical Exam: Cardiac:  regular Lungs:  Non labored Incisions:  Left groin wound vac with good seal  Extremities:  +palpable left DP; still with chronic swelling of LLE   CBC    Component Value Date/Time   WBC 9.5 11/04/2017 1819   RBC 3.47 (L) 11/04/2017 1819   HGB 10.3 (L) 11/04/2017 1819   HCT 32.6 (L) 11/04/2017 1819   PLT 319 11/04/2017 1819   MCV 93.9 11/04/2017 1819   MCH 29.7 11/04/2017 1819   MCHC 31.6 11/04/2017 1819   RDW 17.0 (H) 11/04/2017 1819   LYMPHSABS 0.7 10/28/2017 2219   MONOABS 0.8 10/28/2017 2219   EOSABS 0.1 10/28/2017 2219   BASOSABS 0.0 10/28/2017 2219    BMET    Component Value Date/Time   NA 131 (L) 11/06/2017 0206   K 4.8 11/06/2017 0206   CL 101 11/06/2017 0206   CO2 21 (L) 11/06/2017 0206   GLUCOSE 229 (H) 11/06/2017 0206   BUN 34 (H) 11/06/2017 0206   CREATININE 1.49 (H) 11/06/2017 0206   CREATININE 1.32 04/18/2012 0949   CALCIUM 7.9 (L) 11/06/2017 0206   GFRNONAA 43 (L) 11/06/2017 0206   GFRAA 50 (L) 11/06/2017 0206    INR    Component Value Date/Time   INR 1.20 11/04/2017 1819     Intake/Output Summary (Last 24 hours) at 11/07/2017 0831 Last data filed at 11/07/2017 8270 Gross per 24 hour  Intake 240 ml  Output 700 ml  Net -460 ml   Specimen Description GROIN   Special Requests NONE   Gram Stain MODERATE WBC PRESENT,BOTH PMN AND MONONUCLEAR  FEW GRAM VARIABLE ROD     Culture MODERATE GRAM NEGATIVE RODS  CULTURE REINCUBATED FOR BETTER GROWTH  Performed at [facility], [place]. [address] [place], [place] [address]     Report Status PENDING     Assessment:   77 y.o. male is s/p:  Excisional debridement of left groin placement of VAC dressing   2 Days Post-Op   Plan: -pt with palpable left DP pulse;  -moderate GNR on culture-re-incubated for better growth.  Continue Zosyn.  Vanc d/c'd -chronic swelling-will order TED hose, but unsure if he will tolerate -continue PT and trying to mobilize pt-he did not tolerate this very well yesterday but continue to encourage mobilization.  -DVT prophylaxis:  Sq heparin   Leontine Locket, PA-C [facility] [phone] 11/07/2017 8:31 AM   I have interviewed and examined patient with PA and agree with assessment and plan above.   Keymoni Mccaster C. Donzetta Matters, MD [facility] [place] Office: [phone] Pager: [phone]

## 2017-11-07 NOTE — Progress Notes (Signed)
TED hose not place per order as patient is not willing to try XL size on left leg. Left leg elevated on foam pad with heel floated.

## 2017-11-08 LAB — GLUCOSE, CAPILLARY
GLUCOSE-CAPILLARY: 161 mg/dL — AB (ref 65–99)
Glucose-Capillary: 126 mg/dL — ABNORMAL HIGH (ref 65–99)
Glucose-Capillary: 184 mg/dL — ABNORMAL HIGH (ref 65–99)
Glucose-Capillary: 154 mg/dL — ABNORMAL HIGH (ref 65–99)

## 2017-11-08 NOTE — Care Management Important Message (Signed)
Important Message  Patient Details  Name: [name] MRN: [id number] Date of Birth: 1940/09/23   Medicare Important Message Given:  Yes    [name] P [name] 11/08/2017, 5:06 PM

## 2017-11-08 NOTE — Progress Notes (Signed)
Visited with patient and his wife Sherri.  Talked about some different things and about his health concerns.  Had prayer regarding health concerns and for peace of mind and heart and for the staff caring for him.  [name], Chaplain   11/08/17 1800  Clinical Encounter Type  Visited With Patient and family together  Visit Type Initial;Spiritual support  Referral From Nurse  Consult/Referral To Chaplain  Spiritual Encounters  Spiritual Needs Prayer;Emotional  Stress Factors  Patient Stress Factors Health changes  Family Stress Factors Health changes

## 2017-11-08 NOTE — Progress Notes (Signed)
Physical Therapy Treatment Patient Details Name: [name] MRN: [id number] DOB: [birth year] Today's Date: 11/08/2017    History of Present Illness 77 y.o. patient who underwent left common femoral endarterectomy and left femoral to below-knee popliteal bypass 6/03, discharged to SNF, returns for follow up with left groin wound infection, debrided and wound vac placed 6/14. PMH includes: foot drop L, DM, peripheral neuropathy, PVD, L food wounds, B RTC repair    PT Comments    Pt making steady progress with PT. Mobility is slowly but steadily improving.    Follow Up Recommendations  SNF;Supervision/Assistance - 24 hour     Equipment Recommendations  Other (comment)(TBD at SNF)    Recommendations for Other Services       Precautions / Restrictions Precautions Precautions: Fall    Mobility  Bed Mobility Overal bed mobility: Needs Assistance Bed Mobility: Supine to Sit     Supine to sit: Max assist;+2 for physical assistance;HOB elevated;+2 for safety/equipment     General bed mobility comments: Max A +2 and requiring assistance to bring BLEs towards EOB and then bring hips towards EOB with bed pad. Pt using bed rail to pull towards EOB and then elevating trunk with assistance.   Transfers Overall transfer level: Needs assistance Equipment used: Ambulation equipment used Transfers: Sit to/from Stand Sit to Stand: +2 physical assistance;From elevated surface;Mod assist         General transfer comment: Assist to bring hips and trunk up. Incr time to complete rise to stand. Verbal cues to stand more erect  Ambulation/Gait                 Stairs             Wheelchair Mobility    Modified Rankin (Stroke Patients Only)       Balance Overall balance assessment: Needs assistance Sitting-balance support: Bilateral upper extremity supported;Feet supported Sitting balance-Leahy Scale: Poor Sitting balance - Comments: UE support sitting EOB    Standing balance support: Bilateral upper extremity supported;During functional activity Standing balance-Leahy Scale: Poor Standing balance comment: UE support on Stedy and +2 mod assist                            Cognition Arousal/Alertness: Awake/alert Behavior During Therapy: WFL for tasks assessed/performed Overall Cognitive Status: Within Functional Limits for tasks assessed                                        Exercises      General Comments        Pertinent Vitals/Pain Pain Assessment: Faces Faces Pain Scale: Hurts whole lot Pain Location: L groin Pain Descriptors / Indicators: Grimacing;Moaning;Guarding;Constant Pain Intervention(s): Limited activity within patient's tolerance;Premedicated before session;Monitored during session    Home Living                      Prior Function            PT Goals (current goals can now be found in the care plan section) Progress towards PT goals: Progressing toward goals    Frequency    Min 2X/week      PT Plan Current plan remains appropriate    Co-evaluation PT/OT/SLP Co-Evaluation/Treatment: Yes Reason for Co-Treatment: For patient/therapist safety          AM-PAC PT "6 Clicks"  Daily Activity  Outcome Measure  Difficulty turning over in bed (including adjusting bedclothes, sheets and blankets)?: Unable Difficulty moving from lying on back to sitting on the side of the bed? : Unable Difficulty sitting down on and standing up from a chair with arms (e.g., wheelchair, bedside commode, etc,.)?: Unable Help needed moving to and from a bed to chair (including a wheelchair)?: Total Help needed walking in hospital room?: Total Help needed climbing 3-5 steps with a railing? : Total 6 Click Score: 6    End of Session Equipment Utilized During Treatment: Gait belt Activity Tolerance: Patient limited by pain Patient left: in chair;with call bell/phone within reach Nurse  Communication: Mobility status;Need for lift equipment PT Visit Diagnosis: Unsteadiness on feet (R26.81);Other abnormalities of gait and mobility (R26.89);Pain Pain - Right/Left: Left Pain - part of body: Hip     Time: 1005-1100 PT Time Calculation (min) (ACUTE ONLY): 55 min  Charges:  $Therapeutic Activity: 23-37 mins                    G CodesMarland Kitchen       [facility] PT [place] 11/08/2017, 5:36 PM

## 2017-11-08 NOTE — Evaluation (Signed)
Occupational Therapy Evaluation Patient Details Name: [name] MRN: [id number] DOB: 1940/09/05 Today's Date: 11/08/2017    History of Present Illness 77 y.o. patient who underwent left common femoral endarterectomy and left femoral to below-knee popliteal bypass 6/03, discharged to SNF, returns for follow up with left groin wound infection, debrided and wound vac placed 6/14. PMH includes: foot drop L, DM, peripheral neuropathy, PVD, L food wounds, B RTC repair   Clinical Impression    PTA, pt was at [facility] for rehab after surgery on 6/3 and requiring assistance for ADLs and functional mobility. Pt currently requiring Min A for UB ADLs, Max A for LB ADLs, and Mod A +2 for functional transfers with sara stedy. Pt demonstrating generalized weakness, functional limitations due to pain, and poor activity tolerance. Pt would benefit from further acute OT to facilitate safe dc. Recommend dc to SNF to return to rehab for further OT to optimize safety, independence with ADLs, and return to PLOF.      Follow Up Recommendations  SNF;Supervision/Assistance - 24 hour    Equipment Recommendations  Other (comment)(Defer to next venue)    Recommendations for Other Services PT consult     Precautions / Restrictions Precautions Precautions: Fall      Mobility Bed Mobility Overal bed mobility: Needs Assistance Bed Mobility: Supine to Sit     Supine to sit: Max assist;+2 for physical assistance;HOB elevated;+2 for safety/equipment     General bed mobility comments: Max A +2 and requiring assistance to bring BLEs towards EOB and then bring hips towards EOB with bed pad. Pt using bed rail to pull towards EOB and then elevating trunk with assistance.   Transfers Overall transfer level: Needs assistance   Transfers: Sit to/from Stand Sit to Stand: +2 physical assistance;From elevated surface;Mod assist         General transfer comment: Pt requiring Mod A +2 to power up into  standing with use of sara stedy to pull into standing and block bilateral knees    Balance Overall balance assessment: Needs assistance Sitting-balance support: Bilateral upper extremity supported;Feet supported Sitting balance-Leahy Scale: Fair     Standing balance support: Bilateral upper extremity supported;During functional activity Standing balance-Leahy Scale: Poor Standing balance comment: Reliant on UE support                           ADL either performed or assessed with clinical judgement   ADL Overall ADL's : Needs assistance/impaired Eating/Feeding: Set up;Sitting   Grooming: Set up;Supervision/safety;Sitting   Upper Body Bathing: Minimal assistance;Sitting   Lower Body Bathing: Maximal assistance;Sit to/from stand;+2 for physical assistance   Upper Body Dressing : Minimal assistance;Sitting   Lower Body Dressing: Maximal assistance;+2 for physical assistance;Sit to/from stand   Toilet Transfer: Maximal assistance;+2 for physical assistance;BSC(sara stedy)   Toileting- Clothing Manipulation and Hygiene: Maximal assistance;+2 for physical assistance;Sit to/from stand Toileting - Clothing Manipulation Details (indicate cue type and reason): Max A for toilet hygiene with second person for standing balance using sara stedy     Functional mobility during ADLs: Maximal assistance;+2 for safety/equipment;+2 for physical assistance;Cueing for safety;Cueing for sequencing General ADL Comments: Pt with decreased functional performance due to pain, weakness, and poor activity tolerance.      Vision Baseline Vision/History: Wears glasses Patient Visual Report: No change from baseline       Perception     Praxis      Pertinent Vitals/Pain Pain Assessment: Faces Faces  Pain Scale: Hurts whole lot Pain Location: L groin Pain Descriptors / Indicators: Grimacing;Moaning;Guarding;Constant Pain Intervention(s): Monitored during session;Limited activity within  patient's tolerance;Repositioned;RN gave pain meds during session     Hand Dominance Right   Extremity/Trunk Assessment Upper Extremity Assessment Upper Extremity Assessment: Generalized weakness   Lower Extremity Assessment Lower Extremity Assessment: Defer to PT evaluation   Cervical / Trunk Assessment Cervical / Trunk Assessment: Other exceptions Cervical / Trunk Exceptions: increased body habitus   Communication Communication Communication: No difficulties   Cognition Arousal/Alertness: Awake/alert Behavior During Therapy: WFL for tasks assessed/performed Overall Cognitive Status: Within Functional Limits for tasks assessed                                 General Comments: Pt following cues and perseverating on topic of pain   General Comments       Exercises     Shoulder Instructions      Home Living Family/patient expects to be discharged to:: Skilled nursing facility                                        Prior Functioning/Environment Level of Independence: Needs assistance        Comments: At SNF for rehab. Assistance for ADLs        OT Problem List: Decreased strength;Decreased range of motion;Decreased activity tolerance;Impaired balance (sitting and/or standing);Decreased cognition;Decreased safety awareness;Decreased knowledge of use of DME or AE;Decreased knowledge of precautions;Increased edema;Pain      OT Treatment/Interventions: Self-care/ADL training;Therapeutic exercise;Energy conservation;DME and/or AE instruction;Therapeutic activities;Patient/family education    OT Goals(Current goals can be found in the care plan section) Acute Rehab OT Goals Patient Stated Goal: Decrease pain OT Goal Formulation: With patient Time For Goal Achievement: 11/22/17 Potential to Achieve Goals: Good ADL Goals Pt Will Perform Upper Body Dressing: with set-up;with supervision;sitting Pt Will Perform Lower Body Dressing: sit  to/from stand;with min guard assist;with adaptive equipment Pt Will Transfer to Toilet: with min guard assist;ambulating;bedside commode Pt Will Perform Toileting - Clothing Manipulation and hygiene: with min guard assist;sit to/from stand Additional ADL Goal #1: Pt will perform bed mobility with Min Guard A in preparation for ADLs  OT Frequency: Min 2X/week   Barriers to D/C:            Co-evaluation PT/OT/SLP Co-Evaluation/Treatment: Yes Reason for Co-Treatment: For patient/therapist safety;To address functional/ADL transfers   OT goals addressed during session: ADL's and self-care      AM-PAC PT "6 Clicks" Daily Activity     Outcome Measure Help from another person eating meals?: A Little Help from another person taking care of personal grooming?: A Little Help from another person toileting, which includes using toliet, bedpan, or urinal?: A Lot Help from another person bathing (including washing, rinsing, drying)?: A Lot Help from another person to put on and taking off regular upper body clothing?: A Little Help from another person to put on and taking off regular lower body clothing?: A Lot 6 Click Score: 15   End of Session Equipment Utilized During Treatment: Oxygen Nurse Communication: Mobility status;Patient requests pain meds;Need for lift equipment  Activity Tolerance: Patient limited by pain Patient left: in chair;with call bell/phone within reach;with chair alarm set  OT Visit Diagnosis: Unsteadiness on feet (R26.81);Other abnormalities of gait and mobility (R26.89);Muscle weakness (generalized) (M62.81);Pain Pain - Right/Left:  Left Pain - part of body: Leg                Time: 1001-1059 OT Time Calculation (min): 58 min Charges:  OT General Charges $OT Visit: 1 Visit OT Evaluation $OT Eval Moderate Complexity: 1 Mod OT Treatments $Self Care/Home Management : 8-22 mins G-Codes:     [place], OTR/L Acute Rehab Pager: [phone] Office:  [place] 11/08/2017, 11:08 AM

## 2017-11-08 NOTE — Progress Notes (Signed)
Patient spilled his breakfast in the bed and all linen and gown changed. Thorough skin assessment done of back and buttocks and no breakdown noted. Spoke frankly with patient about risk for pressure injury as well and pneumonia and need for movement and frequent position changes as well as IS use. He states he will make more of an effort and be less "grumpy" to those trying to help him. He appears to be in better spirits today and states he is motivated to get better and walk again.

## 2017-11-08 NOTE — Progress Notes (Addendum)
  Progress Note    11/08/2017 7:49 AM 3 Days Post-Op  Subjective:  Sleepy this morning  Afebrile HR 50's-70's  161'W-960'A systolic 54% RA  Vitals:   11/07/17 2338 11/08/17 0433  BP: (!) 142/66 123/62  Pulse:    Resp: 14 (!) 22  Temp: 97.7 F (36.5 C) 98.1 F (36.7 C)  SpO2: 96% 95%    Physical Exam: Cardiac:  regular Lungs:  Non labored Incisions:  Wound vac with good seal; saphenectomy incisions look good Extremities:  Chronic swelling LLE; +palpable left DP pulse   CBC    Component Value Date/Time   WBC 9.5 11/04/2017 1819   RBC 3.47 (L) 11/04/2017 1819   HGB 10.3 (L) 11/04/2017 1819   HCT 32.6 (L) 11/04/2017 1819   PLT 319 11/04/2017 1819   MCV 93.9 11/04/2017 1819   MCH 29.7 11/04/2017 1819   MCHC 31.6 11/04/2017 1819   RDW 17.0 (H) 11/04/2017 1819   LYMPHSABS 0.7 10/28/2017 2219   MONOABS 0.8 10/28/2017 2219   EOSABS 0.1 10/28/2017 2219   BASOSABS 0.0 10/28/2017 2219    BMET    Component Value Date/Time   NA 131 (L) 11/06/2017 0206   K 4.8 11/06/2017 0206   CL 101 11/06/2017 0206   CO2 21 (L) 11/06/2017 0206   GLUCOSE 229 (H) 11/06/2017 0206   BUN 34 (H) 11/06/2017 0206   CREATININE 1.49 (H) 11/06/2017 0206   CREATININE 1.32 04/18/2012 0949   CALCIUM 7.9 (L) 11/06/2017 0206   GFRNONAA 43 (L) 11/06/2017 0206   GFRAA 50 (L) 11/06/2017 0206    INR    Component Value Date/Time   INR 1.20 11/04/2017 1819     Intake/Output Summary (Last 24 hours) at 11/08/2017 0749 Last data filed at 11/08/2017 0540 Gross per 24 hour  Intake 1440 ml  Output 1200 ml  Net 240 ml   Specimen Description GROIN   Special Requests NONE   Gram Stain MODERATE WBC PRESENT,BOTH PMN AND MONONUCLEAR  FEW GRAM VARIABLE ROD  Performed at [facility], [place] [address] [place], [place] [address]     Culture MODERATE GRAM NEGATIVE RODS  IDENTIFICATION AND SUSCEPTIBILITIES TO FOLLOW  NO ANAEROBES ISOLATED; CULTURE IN PROGRESS FOR 5 DAYS        Assessment:   77 y.o. male is s/p:  Excisional debridement of left groin placement of VAC dressing  3 Days Post-Op  Plan: -pt with palpable left DP pulse; still with chronic swelling left leg.  I elevated his leg this morning.  Did not want to wear the TED hose. -DVT prophylaxis:  Sq heparin -Dr. Oneida Alar will take vac off later today to inspect the wound.  -GNR-continue Zosyn until final susceptibilities come back. -continue to try to mobilize; PT  Leontine Locket, PA-C [facility] [phone] 11/08/2017 7:49 AM  Left groin wound still with some areas of poorly healing fat necrosis Minimal granulation Emphasized nutrition Updated pt and wife he is at very high risk for graft infection limb loss and even death if aortic graft becomes infected  Recheck albumin Continue nutrition supplements and antibiotics Back to OR on Wednesday for another debridement of groin D/c VaC for now and go back to wet to dry dressings  Ruta Hinds, MD [facility] [place]: [phone] Pager: [phone]

## 2017-11-08 NOTE — Progress Notes (Signed)
Physical Therapy Treatment Patient Details Name: [name] MRN: [id number] DOB: [birth year] Today's Date: 11/08/2017    History of Present Illness 77 y.o. patient who underwent left common femoral endarterectomy and left femoral to below-knee popliteal bypass 6/03, discharged to SNF, returns for follow up with left groin wound infection, debrided and wound vac placed 6/14. PMH includes: foot drop L, DM, peripheral neuropathy, PVD, L food wounds, B RTC repair    PT Comments    Pt tolerated up in chair 2 hours. Pt with excellent effort in working to improve mobility and responding well to verbal cues.   Follow Up Recommendations  SNF;Supervision/Assistance - 24 hour     Equipment Recommendations  Other (comment)(TBD at SNF)    Recommendations for Other Services       Precautions / Restrictions Precautions Precautions: Fall    Mobility  Bed Mobility Overal bed mobility: Needs Assistance Bed Mobility: Sit to Supine     Supine to sit: Max assist;+2 for physical assistance;HOB elevated;+2 for safety/equipment Sit to supine: +2 for physical assistance;Max assist   General bed mobility comments: Assist to lower trunk and to bring legs up into bed  Transfers Overall transfer level: Needs assistance Equipment used: Ambulation equipment used Transfers: Sit to/from Stand Sit to Stand: +2 physical assistance;Max assist         General transfer comment: Heavy assist to bring hips up from low recliner. Incr time and effort to achieve standing  Ambulation/Gait                 Stairs             Wheelchair Mobility    Modified Rankin (Stroke Patients Only)       Balance Overall balance assessment: Needs assistance Sitting-balance support: Bilateral upper extremity supported;Feet supported Sitting balance-Leahy Scale: Poor Sitting balance - Comments: UE support sitting EOB   Standing balance support: Bilateral upper extremity supported;During  functional activity Standing balance-Leahy Scale: Poor Standing balance comment: UE support on Stedy and +2 mod assist                            Cognition Arousal/Alertness: Awake/alert Behavior During Therapy: WFL for tasks assessed/performed Overall Cognitive Status: Within Functional Limits for tasks assessed                                        Exercises      General Comments        Pertinent Vitals/Pain Pain Assessment: Faces Faces Pain Scale: Hurts whole lot Pain Location: L groin Pain Descriptors / Indicators: Grimacing;Moaning;Guarding;Constant Pain Intervention(s): Limited activity within patient's tolerance;Monitored during session;Repositioned    Home Living                      Prior Function            PT Goals (current goals can now be found in the care plan section) Progress towards PT goals: Progressing toward goals    Frequency    Min 2X/week      PT Plan Current plan remains appropriate    Co-evaluation PT/OT/SLP Co-Evaluation/Treatment: Yes Reason for Co-Treatment: For patient/therapist safety          AM-PAC PT "6 Clicks" Daily Activity  Outcome Measure  Difficulty turning over in bed (including adjusting bedclothes, sheets and blankets)?:  Unable Difficulty moving from lying on back to sitting on the side of the bed? : Unable Difficulty sitting down on and standing up from a chair with arms (e.g., wheelchair, bedside commode, etc,.)?: Unable Help needed moving to and from a bed to chair (including a wheelchair)?: Total Help needed walking in hospital room?: Total Help needed climbing 3-5 steps with a railing? : Total 6 Click Score: 6    End of Session Equipment Utilized During Treatment: Gait belt Activity Tolerance: Patient limited by pain Patient left: in chair;with call bell/phone within reach Nurse Communication: Mobility status;Need for lift equipment PT Visit Diagnosis: Unsteadiness  on feet (R26.81);Other abnormalities of gait and mobility (R26.89);Pain Pain - Right/Left: Left Pain - part of body: Hip     Time: 1230-1249 PT Time Calculation (min) (ACUTE ONLY): 19 min  Charges:  $Therapeutic Activity: 8-22 mins                    G Codes:       [facility] PT [place] 11/08/2017, 5:41 PM

## 2017-11-08 NOTE — Progress Notes (Signed)
Had phone conversation with daughter who requests Dr Oneida Alar to call her tonight or tomorrow. Abby-phone:  [phone]

## 2017-11-09 ENCOUNTER — Other Ambulatory Visit: Payer: Self-pay

## 2017-11-09 ENCOUNTER — Encounter (HOSPITAL_COMMUNITY): Payer: Self-pay | Admitting: General Practice

## 2017-11-09 LAB — BASIC METABOLIC PANEL
ANION GAP: 9 (ref 5–15)
BUN: 38 mg/dL — ABNORMAL HIGH (ref 6–20)
CHLORIDE: 101 mmol/L (ref 101–111)
CO2: 22 mmol/L (ref 22–32)
Calcium: 8.2 mg/dL — ABNORMAL LOW (ref 8.9–10.3)
Creatinine, Ser: 1.61 mg/dL — ABNORMAL HIGH (ref 0.61–1.24)
GFR calc non Af Amer: 39 mL/min — ABNORMAL LOW (ref >60)
GFR, EST AFRICAN AMERICAN: 46 mL/min — AB (ref >60)
Glucose, Bld: 148 mg/dL — ABNORMAL HIGH (ref 65–99)
POTASSIUM: 4.8 mmol/L (ref 3.5–5.1)
Sodium: 132 mmol/L — ABNORMAL LOW (ref 135–145)

## 2017-11-09 LAB — CBC
HEMATOCRIT: 31.3 % — AB (ref 39.0–52.0)
Hemoglobin: 9.8 g/dL — ABNORMAL LOW (ref 13.0–17.0)
MCH: 29.6 pg (ref 26.0–34.0)
MCHC: 31.3 g/dL (ref 30.0–36.0)
MCV: 94.6 fL (ref 78.0–100.0)
Platelets: 312 10*3/uL (ref 150–400)
RBC: 3.31 MIL/uL — AB (ref 4.22–5.81)
RDW: 16.8 % — ABNORMAL HIGH (ref 11.5–15.5)
WBC: 8.5 10*3/uL (ref 4.0–10.5)

## 2017-11-09 LAB — GLUCOSE, CAPILLARY
GLUCOSE-CAPILLARY: 203 mg/dL — AB (ref 65–99)
GLUCOSE-CAPILLARY: 171 mg/dL — AB (ref 65–99)
GLUCOSE-CAPILLARY: 178 mg/dL — AB (ref 65–99)
Glucose-Capillary: 162 mg/dL — ABNORMAL HIGH (ref 65–99)

## 2017-11-09 LAB — ALBUMIN: ALBUMIN: 2 g/dL — AB (ref 3.5–5.0)

## 2017-11-09 LAB — PREALBUMIN: Prealbumin: 15.9 mg/dL — ABNORMAL LOW (ref 18–38)

## 2017-11-09 MED ORDER — LACTULOSE 10 GM/15ML PO SOLN
20.0000 g | Freq: Once | ORAL | Status: AC
  Administered 2017-11-09: 20 g via ORAL
  Filled 2017-11-09: qty 30

## 2017-11-09 MED ORDER — BISACODYL 10 MG RE SUPP
10.0000 mg | Freq: Once | RECTAL | Status: AC
  Administered 2017-11-09: 10 mg via RECTAL
  Filled 2017-11-09: qty 1

## 2017-11-09 NOTE — Progress Notes (Signed)
PT Cancellation Note  Patient Details Name: DEAIRE MCWHIRTER MRN: [id number] DOB: 1941-03-08   Cancelled Treatment:    Reason Eval/Treat Not Completed: Other (comment). Entered room at 10:40 to let pt know we would be coming to get OOB at 11:00. Pt reported he was returning to surgery tomorrow so we didn't need to see him today. Explained to pt that even though he was returning to surgery tomorrow it was important to mobilize as much as possible.  Asked nurse to premedicated pt with pain medicine prior which she did. Returned to room at 11:00. Pt reported he had gotten a suppository and was on the bed pan. Myself and the rehab technician waited ~ 10 minutes outside the room. Returned to room to see if he had any success on bedpan. He had not. Suggested to the patient that we assist him up to the bsc to help facilitate BM. Explained being upright on the bsc might help. Pt refused to attempt. Returned several more times over the next 30 minutes with the same result. Wife present and aware of attempts. Left room at 11:38.   [facility] [place] 11/09/2017, 12:24 PM

## 2017-11-09 NOTE — Progress Notes (Signed)
PHARMACIST - PHYSICIAN COMMUNICATION  CONCERNING:  Zosyn   RECOMMENDATION: Streamline to Cefazolin 1 gram iv Q 8  DESCRIPTION: Groin wound culture back with Klebsiella and E coli both sensitive to Cefazolin   Thank you Anette Guarneri, PharmD [phone]

## 2017-11-09 NOTE — H&P (View-Only) (Signed)
Progress Note    11/09/2017 10:09 AM 4 Days Post-Op  Subjective:  Wants a suppository  afebrile  Vitals:   11/09/17 0416 11/09/17 0947  BP: (!) 155/60 (!) 138/53  Pulse: 61 69  Resp: (!) 22   Temp: 98.4 F (36.9 C) 97.6 F (36.4 C)  SpO2: 95%     Physical Exam: General:  No distress Lungs:  Non labored Incisions:  Decent granulation tissue distal wound, however, proximal portion of the wound with fatty necrosis; other incisions look fine Extremities:  Less edema LLE today Abdomen:  Soft, NT/ND  CBC    Component Value Date/Time   WBC 8.5 11/09/2017 0323   RBC 3.31 (L) 11/09/2017 0323   HGB 9.8 (L) 11/09/2017 0323   HCT 31.3 (L) 11/09/2017 0323   PLT 312 11/09/2017 0323   MCV 94.6 11/09/2017 0323   MCH 29.6 11/09/2017 0323   MCHC 31.3 11/09/2017 0323   RDW 16.8 (H) 11/09/2017 0323   LYMPHSABS 0.7 10/28/2017 2219   MONOABS 0.8 10/28/2017 2219   EOSABS 0.1 10/28/2017 2219   BASOSABS 0.0 10/28/2017 2219    BMET    Component Value Date/Time   NA 132 (L) 11/09/2017 0323   K 4.8 11/09/2017 0323   CL 101 11/09/2017 0323   CO2 22 11/09/2017 0323   GLUCOSE 148 (H) 11/09/2017 0323   BUN 38 (H) 11/09/2017 0323   CREATININE 1.61 (H) 11/09/2017 0323   CREATININE 1.32 04/18/2012 0949   CALCIUM 8.2 (L) 11/09/2017 0323   GFRNONAA 39 (L) 11/09/2017 0323   GFRAA 46 (L) 11/09/2017 0323    INR    Component Value Date/Time   INR 1.20 11/04/2017 1819     Intake/Output Summary (Last 24 hours) at 11/09/2017 1009 Last data filed at 11/09/2017 0419 Gross per 24 hour  Intake 1200 ml  Output 1525 ml  Net -325 ml     Assessment:  77 y.o. male is s/p:  Excisional debridement of left groin placement of VAC dressing  4 Days Post-Op  Plan: -dressing removed and wound inspected.  Decent granulation tissue distal wound, however, proximal portion of the wound with fatty necrosis -plan for further debridement in the OR tomorrow -npo after MN/consent -DVT prophylaxis:   Sq heparin  -mobilize   Leontine Locket, PA-C [facility] [phone] 11/09/2017 10:09 AM  Left groin wound cleaner than yesterday with some granulation but still foul smell Will return to OR tomorrow for further debridement  Albumin and prealbumin decreased.  Still with moderate to severe protein calorie malnutrition.  Continue to supplement  Daughter updated at length by phone for approximately 20 minutes.  Discussed risk of limb loss or death if we have to remove graft but would only remove if signs of sepsis or bleeding.    She expressed multiple concerns regarding physical therapy not being done as much as she wanted and felt like she needed more updates.  She also informed me that she had been reading up on her father's condition in the STS database and the [place].  She asked if her family could do anything to help.  I again emphasized eating and working hard to regain mobility.    She asked whether or not an Internal Medicine or Nephrology consult would be helpful and I told her that her father's kidneys though not normal Nephrology was not currently necessary and I did not feel he had a problem that the medical service could assist.  She also wished for  [place] to emphasize pain meds to her father even if he refuses them.  She states that if we explain why he may be more interested in using pain meds.  I will try to update her daily.  She has requested a palliative care consult primarily for helping her family in the patient's current situation.  We will do this but I do not feel the patient has reached the point in his clinical course that palliation is the goal  Ruta Hinds, MD [facility] [place]: [phone] Pager: [phone]

## 2017-11-09 NOTE — Plan of Care (Signed)
  Problem: Activity: Goal: Risk for activity intolerance will decrease Outcome: Progressing  Patient still in a lot of pain when LLE is manipulated in any way. Rolls from one side to the other but is not ambulating.

## 2017-11-09 NOTE — Progress Notes (Addendum)
Progress Note    11/09/2017 10:09 AM 4 Days Post-Op  Subjective:  Wants a suppository  afebrile  Vitals:   11/09/17 0416 11/09/17 0947  BP: (!) 155/60 (!) 138/53  Pulse: 61 69  Resp: (!) 22   Temp: 98.4 F (36.9 C) 97.6 F (36.4 C)  SpO2: 95%     Physical Exam: General:  No distress Lungs:  Non labored Incisions:  Decent granulation tissue distal wound, however, proximal portion of the wound with fatty necrosis; other incisions look fine Extremities:  Less edema LLE today Abdomen:  Soft, NT/ND  CBC    Component Value Date/Time   WBC 8.5 11/09/2017 0323   RBC 3.31 (L) 11/09/2017 0323   HGB 9.8 (L) 11/09/2017 0323   HCT 31.3 (L) 11/09/2017 0323   PLT 312 11/09/2017 0323   MCV 94.6 11/09/2017 0323   MCH 29.6 11/09/2017 0323   MCHC 31.3 11/09/2017 0323   RDW 16.8 (H) 11/09/2017 0323   LYMPHSABS 0.7 10/28/2017 2219   MONOABS 0.8 10/28/2017 2219   EOSABS 0.1 10/28/2017 2219   BASOSABS 0.0 10/28/2017 2219    BMET    Component Value Date/Time   NA 132 (L) 11/09/2017 0323   K 4.8 11/09/2017 0323   CL 101 11/09/2017 0323   CO2 22 11/09/2017 0323   GLUCOSE 148 (H) 11/09/2017 0323   BUN 38 (H) 11/09/2017 0323   CREATININE 1.61 (H) 11/09/2017 0323   CREATININE 1.32 04/18/2012 0949   CALCIUM 8.2 (L) 11/09/2017 0323   GFRNONAA 39 (L) 11/09/2017 0323   GFRAA 46 (L) 11/09/2017 0323    INR    Component Value Date/Time   INR 1.20 11/04/2017 1819     Intake/Output Summary (Last 24 hours) at 11/09/2017 1009 Last data filed at 11/09/2017 0419 Gross per 24 hour  Intake 1200 ml  Output 1525 ml  Net -325 ml     Assessment:  77 y.o. male is s/p:  Excisional debridement of left groin placement of VAC dressing  4 Days Post-Op  Plan: -dressing removed and wound inspected.  Decent granulation tissue distal wound, however, proximal portion of the wound with fatty necrosis -plan for further debridement in the OR tomorrow -npo after MN/consent -DVT prophylaxis:   Sq heparin  -mobilize   Leontine Locket, PA-C [facility] [phone] 11/09/2017 10:09 AM  Left groin wound cleaner than yesterday with some granulation but still foul smell Will return to OR tomorrow for further debridement  Albumin and prealbumin decreased.  Still with moderate to severe protein calorie malnutrition.  Continue to supplement  Daughter updated at length by phone for approximately 20 minutes.  Discussed risk of limb loss or death if we have to remove graft but would only remove if signs of sepsis or bleeding.    She expressed multiple concerns regarding physical therapy not being done as much as she wanted and felt like she needed more updates.  She also informed me that she had been reading up on her father's condition in the STS database and the [place].  She asked if her family could do anything to help.  I again emphasized eating and working hard to regain mobility.    She asked whether or not an Internal Medicine or Nephrology consult would be helpful and I told her that her father's kidneys though not normal Nephrology was not currently necessary and I did not feel he had a problem that the medical service could assist.  She also wished for  [place] to emphasize pain meds to her father even if he refuses them.  She states that if we explain why he may be more interested in using pain meds.  I will try to update her daily.  She has requested a palliative care consult primarily for helping her family in the patient's current situation.  We will do this but I do not feel the patient has reached the point in his clinical course that palliation is the goal  Ruta Hinds, MD [facility] [place]: [phone] Pager: [phone]

## 2017-11-10 ENCOUNTER — Inpatient Hospital Stay (HOSPITAL_COMMUNITY): Payer: Medicare HMO | Admitting: Certified Registered"

## 2017-11-10 ENCOUNTER — Encounter (HOSPITAL_COMMUNITY)
Admission: AD | Disposition: A | Discharge status: Skilled Nursing Facility | Payer: Self-pay | Source: Ambulatory Visit | Attending: Vascular Surgery

## 2017-11-10 ENCOUNTER — Encounter (HOSPITAL_COMMUNITY): Payer: Self-pay | Admitting: Certified Registered"

## 2017-11-10 DIAGNOSIS — Z951 Presence of aortocoronary bypass graft: Secondary | ICD-10-CM

## 2017-11-10 DIAGNOSIS — E8770 Fluid overload, unspecified: Secondary | ICD-10-CM

## 2017-11-10 DIAGNOSIS — E8779 Other fluid overload: Secondary | ICD-10-CM

## 2017-11-10 DIAGNOSIS — N183 Chronic kidney disease, stage 3 (moderate): Secondary | ICD-10-CM

## 2017-11-10 DIAGNOSIS — I9789 Other postprocedural complications and disorders of the circulatory system, not elsewhere classified: Secondary | ICD-10-CM

## 2017-11-10 DIAGNOSIS — Z7189 Other specified counseling: Secondary | ICD-10-CM

## 2017-11-10 DIAGNOSIS — T8149XA Infection following a procedure, other surgical site, initial encounter: Secondary | ICD-10-CM

## 2017-11-10 DIAGNOSIS — I739 Peripheral vascular disease, unspecified: Secondary | ICD-10-CM

## 2017-11-10 DIAGNOSIS — I5032 Chronic diastolic (congestive) heart failure: Secondary | ICD-10-CM

## 2017-11-10 DIAGNOSIS — Z515 Encounter for palliative care: Secondary | ICD-10-CM

## 2017-11-10 HISTORY — PX: GROIN DEBRIDEMENT: SHX5159

## 2017-11-10 HISTORY — PX: APPLICATION OF WOUND VAC: SHX5189

## 2017-11-10 LAB — GLUCOSE, CAPILLARY
GLUCOSE-CAPILLARY: 117 mg/dL — AB (ref 65–99)
GLUCOSE-CAPILLARY: 127 mg/dL — AB (ref 65–99)
GLUCOSE-CAPILLARY: 139 mg/dL — AB (ref 65–99)
Glucose-Capillary: 125 mg/dL — ABNORMAL HIGH (ref 65–99)
Glucose-Capillary: 144 mg/dL — ABNORMAL HIGH (ref 65–99)

## 2017-11-10 LAB — BASIC METABOLIC PANEL
Anion gap: 8 (ref 5–15)
BUN: 36 mg/dL — ABNORMAL HIGH (ref 6–20)
CO2: 23 mmol/L (ref 22–32)
CREATININE: 1.48 mg/dL — AB (ref 0.61–1.24)
Calcium: 8.2 mg/dL — ABNORMAL LOW (ref 8.9–10.3)
Chloride: 102 mmol/L (ref 101–111)
GFR, EST AFRICAN AMERICAN: 50 mL/min — AB (ref >60)
GFR, EST NON AFRICAN AMERICAN: 44 mL/min — AB (ref >60)
Glucose, Bld: 139 mg/dL — ABNORMAL HIGH (ref 65–99)
Potassium: 4.5 mmol/L (ref 3.5–5.1)
Sodium: 133 mmol/L — ABNORMAL LOW (ref 135–145)

## 2017-11-10 LAB — AEROBIC/ANAEROBIC CULTURE W GRAM STAIN (SURGICAL/DEEP WOUND)

## 2017-11-10 LAB — CBC
HCT: 31.8 % — ABNORMAL LOW (ref 39.0–52.0)
Hemoglobin: 9.8 g/dL — ABNORMAL LOW (ref 13.0–17.0)
MCH: 29.5 pg (ref 26.0–34.0)
MCHC: 30.8 g/dL (ref 30.0–36.0)
MCV: 95.8 fL (ref 78.0–100.0)
Platelets: 374 10*3/uL (ref 150–400)
RBC: 3.32 MIL/uL — AB (ref 4.22–5.81)
RDW: 17.2 % — ABNORMAL HIGH (ref 11.5–15.5)
WBC: 10.1 10*3/uL (ref 4.0–10.5)

## 2017-11-10 LAB — PROTIME-INR
INR: 1.27
Prothrombin Time: 15.8 seconds — ABNORMAL HIGH (ref 11.4–15.2)

## 2017-11-10 LAB — AEROBIC/ANAEROBIC CULTURE (SURGICAL/DEEP WOUND)

## 2017-11-10 SURGERY — DEBRIDEMENT, INGUINAL REGION
Anesthesia: General | Laterality: Left

## 2017-11-10 MED ORDER — POLYETHYLENE GLYCOL 3350 17 G PO PACK
17.0000 g | PACK | Freq: Every day | ORAL | Status: DC | PRN
  Administered 2017-11-13: 17 g via ORAL
  Filled 2017-11-10: qty 1

## 2017-11-10 MED ORDER — PROPOFOL 10 MG/ML IV BOLUS
INTRAVENOUS | Status: AC
  Filled 2017-11-10: qty 20

## 2017-11-10 MED ORDER — GLYCOPYRROLATE PF 0.2 MG/ML IJ SOSY
PREFILLED_SYRINGE | INTRAMUSCULAR | Status: AC
  Filled 2017-11-10: qty 1

## 2017-11-10 MED ORDER — SODIUM CHLORIDE 0.9 % IJ SOLN
INTRAMUSCULAR | Status: AC
  Filled 2017-11-10: qty 10

## 2017-11-10 MED ORDER — FENTANYL CITRATE (PF) 100 MCG/2ML IJ SOLN: 25.0000 ug | INTRAMUSCULAR | Status: DC | PRN

## 2017-11-10 MED ORDER — SUCCINYLCHOLINE CHLORIDE 20 MG/ML IJ SOLN
INTRAMUSCULAR | Status: AC
  Filled 2017-11-10: qty 1

## 2017-11-10 MED ORDER — PROPOFOL 10 MG/ML IV BOLUS
INTRAVENOUS | Status: DC | PRN
  Administered 2017-11-10: 140 mg via INTRAVENOUS

## 2017-11-10 MED ORDER — FUROSEMIDE 40 MG PO TABS
40.0000 mg | ORAL_TABLET | Freq: Every day | ORAL | Status: DC
  Administered 2017-11-11 – 2017-12-03 (×23): 40 mg via ORAL
  Filled 2017-11-10 (×23): qty 1

## 2017-11-10 MED ORDER — FENTANYL CITRATE (PF) 100 MCG/2ML IJ SOLN
25.0000 ug | INTRAMUSCULAR | Status: DC | PRN
  Administered 2017-11-10: 25 ug via INTRAVENOUS

## 2017-11-10 MED ORDER — ONDANSETRON HCL 4 MG/2ML IJ SOLN
INTRAMUSCULAR | Status: AC
  Filled 2017-11-10: qty 2

## 2017-11-10 MED ORDER — FENTANYL CITRATE (PF) 100 MCG/2ML IJ SOLN
INTRAMUSCULAR | Status: AC
  Filled 2017-11-10: qty 2

## 2017-11-10 MED ORDER — 0.9 % SODIUM CHLORIDE (POUR BTL) OPTIME
TOPICAL | Status: DC | PRN
  Administered 2017-11-10: 3000 mL
  Administered 2017-11-10: 1000 mL

## 2017-11-10 MED ORDER — SENNOSIDES-DOCUSATE SODIUM 8.6-50 MG PO TABS
1.0000 | ORAL_TABLET | Freq: Two times a day (BID) | ORAL | Status: DC
  Administered 2017-11-11 – 2017-11-14 (×7): 1 via ORAL
  Filled 2017-11-10 (×8): qty 1

## 2017-11-10 MED ORDER — EPHEDRINE SULFATE-NACL 50-0.9 MG/10ML-% IV SOSY
PREFILLED_SYRINGE | INTRAVENOUS | Status: DC | PRN
  Administered 2017-11-10: 10 mg via INTRAVENOUS

## 2017-11-10 MED ORDER — ONDANSETRON HCL 4 MG/2ML IJ SOLN
INTRAMUSCULAR | Status: DC | PRN
  Administered 2017-11-10: 4 mg via INTRAVENOUS

## 2017-11-10 MED ORDER — SODIUM CHLORIDE 0.9 % IV SOLN
INTRAVENOUS | Status: DC | PRN
  Administered 2017-11-10: 09:00:00 via INTRAVENOUS

## 2017-11-10 MED ORDER — RAMIPRIL 10 MG PO CAPS
10.0000 mg | ORAL_CAPSULE | Freq: Every day | ORAL | Status: DC
  Administered 2017-11-11 – 2017-12-01 (×21): 10 mg via ORAL
  Filled 2017-11-10 (×21): qty 1

## 2017-11-10 MED ORDER — EPHEDRINE SULFATE 50 MG/ML IJ SOLN
INTRAMUSCULAR | Status: AC
  Filled 2017-11-10: qty 1

## 2017-11-10 MED ORDER — GLYCOPYRROLATE PF 0.2 MG/ML IJ SOSY
PREFILLED_SYRINGE | INTRAMUSCULAR | Status: DC | PRN
  Administered 2017-11-10: .2 mg via INTRAVENOUS

## 2017-11-10 MED ORDER — FENTANYL CITRATE (PF) 250 MCG/5ML IJ SOLN
INTRAMUSCULAR | Status: AC
  Filled 2017-11-10: qty 5

## 2017-11-10 MED ORDER — FUROSEMIDE 10 MG/ML IJ SOLN
40.0000 mg | Freq: Once | INTRAMUSCULAR | Status: AC
  Administered 2017-11-10: 40 mg via INTRAVENOUS
  Filled 2017-11-10: qty 4

## 2017-11-10 MED ORDER — FENTANYL CITRATE (PF) 250 MCG/5ML IJ SOLN
INTRAMUSCULAR | Status: DC | PRN
  Administered 2017-11-10 (×4): 50 ug via INTRAVENOUS

## 2017-11-10 MED ORDER — LIDOCAINE 2% (20 MG/ML) 5 ML SYRINGE
INTRAMUSCULAR | Status: DC | PRN
  Administered 2017-11-10: 60 mg via INTRAVENOUS

## 2017-11-10 SURGICAL SUPPLY — 36 items
BNDG GAUZE ELAST 4 BULKY (GAUZE/BANDAGES/DRESSINGS) IMPLANT
CANISTER SUCT 3000ML PPV (MISCELLANEOUS) ×2 IMPLANT
COVER SURGICAL LIGHT HANDLE (MISCELLANEOUS) ×2 IMPLANT
DRSG VAC ATS LRG SENSATRAC (GAUZE/BANDAGES/DRESSINGS) IMPLANT
DRSG VAC ATS MED SENSATRAC (GAUZE/BANDAGES/DRESSINGS) IMPLANT
DRSG VAC ATS SM SENSATRAC (GAUZE/BANDAGES/DRESSINGS) ×1 IMPLANT
ELECT REM PT RETURN 9FT ADLT (ELECTROSURGICAL) ×2
ELECTRODE REM PT RTRN 9FT ADLT (ELECTROSURGICAL) ×1 IMPLANT
GAUZE SPONGE 4X4 12PLY STRL (GAUZE/BANDAGES/DRESSINGS) ×2 IMPLANT
GLOVE BIO SURGEON STRL SZ 6.5 (GLOVE) ×2 IMPLANT
GLOVE BIO SURGEON STRL SZ7.5 (GLOVE) ×2 IMPLANT
GLOVE BIOGEL PI IND STRL 7.5 (GLOVE) ×1 IMPLANT
GLOVE BIOGEL PI INDICATOR 7.5 (GLOVE) ×1
GOWN STRL REUS W/ TWL LRG LVL3 (GOWN DISPOSABLE) ×3 IMPLANT
GOWN STRL REUS W/TWL LRG LVL3 (GOWN DISPOSABLE) ×8
HANDPIECE INTERPULSE COAX TIP (DISPOSABLE) ×2
IV NS IRRIG 3000ML ARTHROMATIC (IV SOLUTION) IMPLANT
KIT BASIN OR (CUSTOM PROCEDURE TRAY) ×2 IMPLANT
KIT TURNOVER KIT B (KITS) ×2 IMPLANT
MANIFOLD NEPTUNE II (INSTRUMENTS) IMPLANT
NS IRRIG 1000ML POUR BTL (IV SOLUTION) ×2 IMPLANT
PACK GENERAL/GYN (CUSTOM PROCEDURE TRAY) ×2 IMPLANT
PACK UNIVERSAL I (CUSTOM PROCEDURE TRAY) ×2 IMPLANT
PAD ARMBOARD 7.5X6 YLW CONV (MISCELLANEOUS) ×4 IMPLANT
PAD NEG PRESSURE SENSATRAC (MISCELLANEOUS) ×1 IMPLANT
SET HNDPC FAN SPRY TIP SCT (DISPOSABLE) IMPLANT
STAPLER VISISTAT 35W (STAPLE) IMPLANT
SUT ETHILON 2 0 PSLX (SUTURE) ×2 IMPLANT
SUT ETHILON 3 0 PS 1 (SUTURE) IMPLANT
SUT VIC AB 2-0 CTX 36 (SUTURE) IMPLANT
SUT VIC AB 3-0 SH 27 (SUTURE)
SUT VIC AB 3-0 SH 27X BRD (SUTURE) IMPLANT
SUT VICRYL 4-0 PS2 18IN ABS (SUTURE) IMPLANT
TOWEL GREEN STERILE (TOWEL DISPOSABLE) ×2 IMPLANT
WATER STERILE IRR 1000ML POUR (IV SOLUTION) ×2 IMPLANT
WND VAC CANISTER 500ML (MISCELLANEOUS) ×1 IMPLANT

## 2017-11-10 NOTE — Progress Notes (Signed)
Escorted pt to pre-op via bed with surgery staff and Alexzandra Bilton RN, followed by pt's wife. CCMD notified. Report given to [facility] Anesthesia in the OR. Zosyn running at 12.5 cc/hr. Will report to day shift RN.

## 2017-11-10 NOTE — Consult Note (Signed)
Medical Consultation   [name]  DEY:[id number]  DOB: 03/14/1941  DOA: 11/04/2017  PCP: Lavone Orn, MD   Requesting physician: Dr. Oneida Alar, vascular  Reason for consultation: PT was admitted today after undergoing wound debridement in the OR for poorly healing left groin wound. Consulted for management of medical problems - DM, HTN, etc.    History of Present Illness: [name] is an 77 y.o. male with PVD, HTN, CKD III, hyperlipidemia, DM2, hypothyroidism, carotid artery stenosis s/p CEA, and remote h/o aortofemoral bypass who had a recent L  popliteal-femoral bypass due to a nonhealing ulcer on his L great toe. His postop course has been complicated by L groin wound infection with pansensitive E. Coli and Klebsiella pneumoniae. He underwent debridement and placement of a wound vac today in the OR and has been started on pip-tazo.   He reports he has chronic orthopnea and was more dyspneic than usual immediately after surgery but this has resolved and he is breathing at baseline now. He denies current or past chest pain. His CABG was "elective" as it was done while he was undergoing CEA and he never had any chest pain or ischemia prior to this. He does not use O2 at home. States that wearing O2 makes him paradoxically feel more short of breath.  During his most recent hospitalization he experienced postop delirium, AKI and blood loss anemia, as well as a developing L groin wound for which he is now admitted. His delirium was thought to be 2/2 morphine and resolved. His AKI resolved and Hgb stabilized prior to discharge.  He has essential HTN which has been managed with lopressor 50 mg BID and ramipril 10 mg daily as well as lasix 40 mg daily prn leg swelling. He states his BP has been well-controlled. He rarely checks his blood sugar; is on no insulin but takes pioglitazone daily and his last HbA1C was 6.2 at the end of May 30. His PCP is Lavone Orn who sees him  every 3 months. Pt has also been seen recently by Dr. Posey Pronto (neuro) for eval of short-term memory loss, incontinence, and ataxia. He had a brain MRI that showed chronic white matter / small vessel disease and mild atrophy but otherwise normal. He has decided he will proceed with LP and CSF analysis to eval for inflammatory etiology such as CIDP.    Review of Systems:  Review of Systems  Constitutional: Negative.   HENT: Negative.   Eyes: Negative.  Negative for double vision.  Respiratory: Positive for shortness of breath (stable).   Cardiovascular: Positive for orthopnea (stable).  Gastrointestinal: Positive for constipation (last week, now resolved).  Genitourinary:       Urinary incontinence  Musculoskeletal: Positive for falls (several falls since Jan 2019, from foot drop).  Skin: Negative.   Neurological: Negative.   Endo/Heme/Allergies: Negative.   Psychiatric/Behavioral: Positive for memory loss (short-term x 6-7 years).   As per HPI otherwise 10 point review of systems negative.    Past Medical History: Past Medical History:  Diagnosis Date  . Arthritis   . Atherosclerotic heart disease   . Back pain   . Bruises easily    takes Pletal daily and ASA 325mg  daily  . Cataract immature    right  . Chest pain   . Colon polyps   . Complication of anesthesia    hard to wake up with bypass surgery  .  Cyst    on perineum;takes Doxycycline daily  . Diabetes mellitus    takes Glipizide,Metformin,and Actos daily  . Diarrhea   . Dyspnea    when lying flat  . Edema    right leg knee down swollen since fall 3 wks ago  . Foot drop, left   . GERD (gastroesophageal reflux disease)    Rolaids as needed-occ reflux  . Hemorrhoids   . History of kidney stones    at age 63   . Hyperlipidemia    takes Tricor and Lipitor daily  . Hyperlipidemia   . Hypertension    takes Altace,Amlodipine,and Nadolol daily  . Hypothyroidism   . Kidney stone    35+yrs ago  . Muscle pain   .  Nasal polyps    hx of  . Peripheral edema    wears knee length hose-told by podiatrist to wear these  . Peripheral neuropathy   . Peripheral neuropathy    takes Gabapentin daily  . Pneumonia    as a child  . PVD (peripheral vascular disease) ([place])   . Renal insufficiency   . Seasonal allergies    takes Mucinex and Zyrtec prn  . Urinary frequency    urgency/takes Vesicare daily    Past Surgical History: Past Surgical History:  Procedure Laterality Date  . Aortobifemoral bypass    . APPLICATION OF WOUND VAC Left 10/25/2017   Procedure: Wound vac placement to left groin.;  Surgeon: Elam Dutch, MD;  Location: [facility] OR;  Service: Vascular;  Laterality: Left;  . CARDIAC CATHETERIZATION  most recent 05/2011   total of 4  . CAROTID ANGIOGRAM N/A 06/05/2011   Procedure: CAROTID ANGIOGRAM;  Surgeon: Elam Dutch, MD;  Location: [facility] CATH LAB;  Service: Cardiovascular;  Laterality: N/A;  . CAROTID ENDARTERECTOMY  04/08/2011   left CEA  . cataract surgery Bilateral    left  . COLONOSCOPY    . COLONOSCOPY WITH PROPOFOL N/A 08/22/2013   Procedure: COLONOSCOPY WITH PROPOFOL;  Surgeon: Garlan Fair, MD;  Location: [facility] ENDOSCOPY;  Service: Endoscopy;  Laterality: N/A;  . CORONARY ARTERY BYPASS GRAFT  09/09/2011   Procedure: CORONARY ARTERY BYPASS GRAFTING (CABG);  Surgeon: Gaye Pollack, MD;  Location: [place];  Service: Open Heart Surgery;  Laterality: N/A;  Coronary artery bypass grafting  x three with Right saphenous vein harvested endoscopically and left internal mammary artery  . ENDARTERECTOMY  04/08/2011   Procedure: ENDARTERECTOMY CAROTID;  Surgeon: Elam Dutch, MD;  [facility];  Service: Vascular;  Laterality: Left;  with patch angioplasty  . ENDARTERECTOMY  09/09/2011   Procedure: ENDARTERECTOMY CAROTID;  Surgeon: Elam Dutch, MD;  Location: [facility] OR;  Service: Vascular;  Laterality: Right;  with patch angioplasty  . FEMORAL-POPLITEAL BYPASS GRAFT Left 10/25/2017    Procedure: Left Common Femoral Endarterectomy and perfundaplasty, Left BYPASS GRAFT FEMORAL-BELOW KNEE POPLITEAL ARTERY.;  Surgeon: Elam Dutch, MD;  Location: [place];  Service: Vascular;  Laterality: Left;  . LEFT HEART CATHETERIZATION WITH CORONARY ANGIOGRAM N/A 07/09/2011   Procedure: LEFT HEART CATHETERIZATION WITH CORONARY ANGIOGRAM;  Surgeon: Sinclair Grooms, MD;  Location: [facility] CATH LAB;  Service: Cardiovascular;  Laterality: N/A;  . LOWER EXTREMITY ANGIOGRAPHY N/A 10/11/2017   Procedure: LOWER EXTREMITY ANGIOGRAPHY;  Surgeon: Waynetta Sandy, MD;  Location: [place] CV LAB;  Service: Cardiovascular;  Laterality: N/A;  . Reimplantation of inferior mesenteric artery    . Repair of infrarenal abdominal aortic aneurysm    .  SHOULDER ARTHROSCOPY W/ ROTATOR CUFF REPAIR     right and left-open procedures  . TONSILLECTOMY     at age 64  . TRIGGER FINGER RELEASE Right 06/28/2014   Procedure: RIGHT LONG TRIGGER RELEASE ;  Surgeon: Leanora Cover, MD;  Location: [place];  Service: Orthopedics;  Laterality: Right;  . wisdom teeth extracted     as a teenager  . WOUND DEBRIDEMENT Left 11/05/2017   Procedure: DEBRIDEMENT WOUND LEFT GROIN with wound vac placement;  Surgeon: Elam Dutch, MD;  Location: [place];  Service: Vascular;  Laterality: Left;     Allergies:   Allergies  Allergen Reactions  . Amoxicillin-Pot Clavulanate Nausea Only    Has patient had a PCN reaction causing immediate rash, facial/tongue/throat swelling, SOB or lightheadedness with hypotension: No Has patient had a PCN reaction causing severe rash involving mucus membranes or skin necrosis: No Has patient had a PCN reaction that required hospitalization: No Has patient had a PCN reaction occurring within the last 10 years: No If all of the above answers are "NO", then may proceed with Cephalosporin use.   [place] Kitchen Morphine And Related Other (See Comments)    hallucinates     Social History:   reports that he quit smoking about 12 years ago. His smoking use included cigarettes. He quit after 40.00 years of use. He has never used smokeless tobacco. He reports that he drinks about 1.2 - 1.8 oz of alcohol per week. He reports that he does not use drugs.   Family History: Family History  Problem Relation Age of Onset  . Heart disease Mother   . Heart disease Father   . Anesthesia problems Neg Hx   . Hypotension Neg Hx   . Malignant hyperthermia Neg Hx   . Pseudochol deficiency Neg Hx       Physical Exam: Vitals:   11/10/17 0005 11/10/17 0425 11/10/17 1019 11/10/17 1020  BP: (!) 144/63 (!) 122/43  (!) 123/49  Pulse: 65 (!) 55  65  Resp: 18 18  17   Temp: 98 F (36.7 C) 97.9 F (36.6 C) 97.7 F (36.5 C)   TempSrc: Oral Oral    SpO2: 99% 98%  (!) 89%  Weight:      Height:        Constitutional: Alert and awake, oriented x3, not in any acute distress. Eyes: PERLA, EOMI, irises appear normal, anicteric sclera,  ENMT: external ears and nose appear normal, normal hearing, Lips appear normal, oropharynx mucosa, tongue, posterior pharynx appear normal  Neck: neck appears normal, no masses, normal ROM, no thyromegaly, no JVD  CVS: well-healed sternotomy scar with some overlying varicosities; S1-S2 clear, no murmur rubs or gallops. 2+ pitting LE edema to knees bilaterally. Palpable DP pulses bilaterally Respiratory:  clear to auscultation bilaterally, no wheezing, rales or rhonchi. Respiratory effort normal. No accessory muscle use.  Abdomen: obese, soft, nontender, nondistended, normal bowel sounds Musculoskeletal: : no cyanosis, clubbing bilaterally. No obvious joint deformities. Neuro: Cranial nerves II-XII intact, strength, sensation, reflexes Psych: judgement and insight appear normal, stable mood and affect, mental status Skin: L great toe black ulcer ~ 2 cm. L groin wound vac in place. Saphenectomy incisions LLE without erythema or exudate.   Data reviewed:  I have  personally reviewed the recent labs and imaging studies  Pertinent Labs:   Glucose 139 BUN 36/Creatinine 1.48/GFR 44; 38/1.61/46 yesterday WBC 10.1 Hgb 9.8 - stable INR 1.27   Inpatient Medications:   Scheduled Meds: . [facility]  Hold] aspirin EC  325 mg Oral q1800  . [MAR Hold] atorvastatin  20 mg Oral QPM  . [MAR Hold] docusate sodium  100 mg Oral Daily  . [MAR Hold] feeding supplement (ENSURE ENLIVE)  237 mL Oral TID BM  . [MAR Hold] feeding supplement (PRO-STAT SUGAR FREE 64)  30 mL Oral BID  . [MAR Hold] fenofibrate  54 mg Oral Daily  . [MAR Hold] gabapentin  300 mg Oral QHS  . [MAR Hold] gabapentin  600 mg Oral Daily  . [MAR Hold] heparin  5,000 Units Subcutaneous Q8H  . [MAR Hold] insulin aspart  0-15 Units Subcutaneous TID WC  . [MAR Hold] levothyroxine  50 mcg Oral QAC breakfast  . [MAR Hold] loratadine  10 mg Oral Daily  . [MAR Hold] metoprolol tartrate  50 mg Oral BID  . [MAR Hold] multivitamin with minerals  1 tablet Oral Daily  . [MAR Hold] pantoprazole  40 mg Oral Daily  . [MAR Hold] pioglitazone  30 mg Oral Q lunch   Continuous Infusions: . [MAR Hold] piperacillin-tazobactam (ZOSYN)  IV 3.375 g (11/10/17 0600)     Radiological Exams on Admission: No results found.  Impression/Recommendations Active Problems:   Diabetes mellitus type 2 with complications (HCC)   S/P CABG (coronary artery bypass graft)   Peripheral vascular disease, unspecified (HCC)   Essential hypertension   Chronic diastolic heart failure (HCC)   Chronic kidney disease, stage III (moderate) (HCC)   Polyneuropathy associated with underlying disease ([place])   Surgical wound infection   Volume overload  Wound infection: continue current management by surgery team -renally dose pip-tazo  Chronic diastolic CHF: pt appears volume overloaded on exam  -IV lasix 20 mg x 1 now -instead of po lasix "prn", have changed to lasix 40 mg daily scheduled. Renal function needs to be monitored daily and  lasix may be changed back to prn once edema has returned to baseline and/or creatinine begins to rise.  -strict I/O -daily weights -daily BMP -monitor and replace K as needed  CKDIII: creat at baseline -monitor creat, lytes, I/O, daily weights -lasix as above -dose all meds for GFR -avoid nephrotoxins -pt does not see a nephrologist as outpatient; would recommend considering establishing care with one if Dr. Laurann Montana agrees.  DM2 -cont pioglitazone -SSI -carb consistent diet  HTN -cont lopressor -lasix as above -ordered pt's home ramipril 10 mg daily to start tomorrow  Constipation -changed colace to pericolace (adds senna); narcotics will make worse -miralax daily prn  Mental slowing, short term memory loss, ataxia, incontinence, h/o ICU delirium -pt being actively evaluated by Dr. Posey Pronto, neurology; CSF analysis is next step (eval for CIDP) -minimize polypharmacy, avoid benzos / sedative hypnotics if at all possible -consider outpatient sleep study given daytime sleepiness  Code status discussion: pt wishes to be FULL CODE after discussion   Thank you for this consultation.  Our [facility] hospitalist team will follow the patient with you.   Time Spent: 14 min   Janora Norlander, MD [facility] 11/10/2017

## 2017-11-10 NOTE — Anesthesia Postprocedure Evaluation (Signed)
Anesthesia Post Note  Patient: [name]  Procedure(s) Performed: Virl Son DEBRIDEMENT (Left ) APPLICATION OF WOUND VAC (Left )     Patient location during evaluation: PACU Anesthesia Type: General Vital Signs Assessment: post-procedure vital signs reviewed and stable Respiratory status: spontaneous breathing Cardiovascular status: stable Anesthetic complications: no    Last Vitals:  Vitals:   11/10/17 1135 11/10/17 1150  BP: 114/83   Pulse: 60 (!) 59  Resp: 12   Temp: 36.6 C   SpO2: 99%     Last Pain:  Vitals:   11/10/17 1135  TempSrc: Axillary  PainSc:                  Jasmia Angst

## 2017-11-10 NOTE — Progress Notes (Signed)
OT Cancellation    11/10/17 0700  OT Visit Information  Last OT Received On 11/10/17  Reason Eval/Treat Not Completed Patient at procedure or test/ unavailable (Surgery. Will return as schedule allows.)   Larna Capelle MSOT, OTR/L Acute Rehab Pager: [phone] Office: [phone]

## 2017-11-10 NOTE — Anesthesia Procedure Notes (Signed)
Procedure Name: LMA Insertion Date/Time: 11/10/2017 8:58 AM Performed by: Barrington Ellison, CRNA Pre-anesthesia Checklist: Patient identified, Emergency Drugs available, Suction available and Patient being monitored Patient Re-evaluated:Patient Re-evaluated prior to induction Oxygen Delivery Method: Circle System Utilized Preoxygenation: Pre-oxygenation with 100% oxygen Induction Type: IV induction Ventilation: Mask ventilation without difficulty LMA: LMA inserted LMA Size: 5.0 Number of attempts: 1 Placement Confirmation: positive ETCO2 Tube secured with: Tape Dental Injury: Teeth and Oropharynx as per pre-operative assessment

## 2017-11-10 NOTE — Progress Notes (Signed)
PT Cancellation Note  Patient Details Name: [name] MRN: [id number] DOB: 1941/04/15   Cancelled Treatment:    Reason Eval/Treat Not Completed: Patient at procedure or test/unavailable. Pt in OR for additional debridement.   Shary Decamp Maycok 11/10/2017, 9:46 AM  Suanne Marker PT [phone]

## 2017-11-10 NOTE — Interval H&P Note (Signed)
History and Physical Interval Note:  11/10/2017 8:31 AM  [name]  has presented today for surgery, with the diagnosis of right groin wound  The various methods of treatment have been discussed with the patient and family. After consideration of risks, benefits and other options for treatment, the patient has consented to  Procedure(s): GROIN DEBRIDEMENT (Right) APPLICATION OF WOUND VAC (Right) as a surgical intervention .  The patient's history has been reviewed, patient examined, no change in status, stable for surgery.  I have reviewed the patient's chart and labs.  Questions were answered to the patient's satisfaction.     Ruta Hinds

## 2017-11-10 NOTE — Op Note (Signed)
Procedure: Debridement left groin, placement of VAC dressing  preoperative diagnosis: Poorly healing left groin wound  Postoperative diagnosis: Same  Anesthesia: General  Assistant: Leontine Locket, PA-C  Operative findings: Areas of fat necrosis no purulent material noted debrided back to healthy appearing tissue  Specimens: Culture from left groin  Operative details: After obtaining informed consent, the patient was taken the operating.  The patient was placed in supine position operating table.  After induction of general anesthesia the patient's left groin was prepped and draped in usual sterile fashion after taping up the pannus to expose the left groin.  Next excisional debridement was performed in the left groin there were areas of fat necrosis which were debrided back to healthy appearing bleeding tissue.  The skin edges were also freshened up to healthy appearing bleeding tissue.  The wound was then thoroughly irrigated with 1 L of normal saline solution followed by 4 L of pulse lavage solution.  I then reapproximated some of the superior portion the incision with 2-0 nylon interrupted sutures to give some additional coverage over the groin.  The central portion of the groin wound was left open and a VAC dressing placed on this.  The graft was not exposed.  However, it is certainly in the near vicinity of the layers covering it currently.  The patient tolerated the procedure well and there were no complications.  The instrument sponge needle count was correct the end of the case.  The patient was taken the recovery room in stable condition.  Ruta Hinds, MD [facility] Office: [phone] Pager: [phone]

## 2017-11-10 NOTE — Anesthesia Preprocedure Evaluation (Signed)
Anesthesia Evaluation  Patient identified by MRN, date of birth, ID band Patient awake    Reviewed: Allergy & Precautions, NPO status , Patient's Chart, lab work & pertinent test results  Airway Mallampati: II  TM Distance: >3 FB     Dental   Pulmonary shortness of breath, pneumonia, former smoker,    breath sounds clear to auscultation       Cardiovascular hypertension, + CAD and + Peripheral Vascular Disease   Rhythm:Regular     Neuro/Psych    GI/Hepatic Neg liver ROS, GERD  ,  Endo/Other  diabetesHypothyroidism   Renal/GU Renal disease     Musculoskeletal   Abdominal   Peds  Hematology   Anesthesia Other Findings   Reproductive/Obstetrics                             Anesthesia Physical Anesthesia Plan  ASA: III  Anesthesia Plan: General   Post-op Pain Management:    Induction: Intravenous  PONV Risk Score and Plan: 2 and Treatment may vary due to age or medical condition  Airway Management Planned: LMA  Additional Equipment:   Intra-op Plan:   Post-operative Plan: Extubation in OR  Informed Consent: I have reviewed the patients History and Physical, chart, labs and discussed the procedure including the risks, benefits and alternatives for the proposed anesthesia with the patient or authorized representative who has indicated his/her understanding and acceptance.     Plan Discussed with: CRNA and Anesthesiologist  Anesthesia Plan Comments:         Anesthesia Quick Evaluation

## 2017-11-10 NOTE — Consult Note (Signed)
Consultation Note Date: 11/10/2017   Patient Name: [name]  DOB: November 22, 1940  MRN: [id number]  Age / Sex: 77 y.o., male  PCP: Lavone Orn, MD Referring Physician: Elam Dutch, MD  Reason for Consultation: Establishing goals of care   Clinical Assessment and Goals of Care: Patient resting in bed. Patient requested I step out and speak with daughter as patient needs new condom cath at this time. Proceeded to conference room. Health history discussed. She is a Marine scientist. She tells me of his decline. She states in 2006 he had a AAA repair, and in 2013, a quadruple bipass. Following the bipass, his hospitalization became complicated, and due to microvascular clots, he became unable to practice malpractice law. She states up until 2019, he was doing okay. He used a cane due to balance problems, but still drove, attending [employer], and organized his high school reunion. In 2019, she noticed he began falling, and had more of a cognitive decline. She states he is good at compensating. He began requiring more explanation of things than he used to, and developed short term memory and personality issues. He began using a walker and developed incontinence.  She states she has been seeing neurology for neuropathic pain. She is concerned for a wound on his foot.   We discussed his current vascular issues. States she does not want to be the bad guy, but she is a realist and has concern for his overall prognosis. She would like a [place] conversation.   In to speak with patient. He has no complaints today. Plans to revisit tomorrow.    SUMMARY OF RECOMMENDATIONS    Revisit tomorrow at 11:00.   Code Status/Advance Care Planning:  Full code    Symptom Management:   Per primary team.   Palliative Prophylaxis:   Eye Care and Oral Care   Prognosis:   Unable to determine  Discharge Planning: To  Be Determined      Primary Diagnoses: Present on Admission: . Surgical wound infection . Diabetes mellitus type 2 with complications ([place]) . Peripheral vascular disease, unspecified ([place]) . Essential hypertension . Chronic diastolic heart failure ([place]) . Chronic kidney disease, stage III (moderate) ([facility]) . Polyneuropathy associated with underlying disease ([place])   I have reviewed the medical record, interviewed the patient and family, and examined the patient. The following aspects are pertinent.  Past Medical History:  Diagnosis Date  . Arthritis   . Atherosclerotic heart disease   . Back pain   . Bruises easily    takes Pletal daily and ASA 325mg  daily  . Cataract immature    right  . Chest pain   . Colon polyps   . Complication of anesthesia    hard to wake up with bypass surgery  . Cyst    on perineum;takes Doxycycline daily  . Diabetes mellitus    takes Glipizide,Metformin,and Actos daily  . Diarrhea   . Dyspnea    when lying flat  . Edema    right leg knee down  swollen since fall 3 wks ago  . Foot drop, left   . GERD (gastroesophageal reflux disease)    Rolaids as needed-occ reflux  . Hemorrhoids   . History of kidney stones    at age 43   . Hyperlipidemia    takes Tricor and Lipitor daily  . Hypertension    takes Altace,Amlodipine,and Nadolol daily  . Hypothyroidism   . Kidney stone    35+yrs ago  . Muscle pain   . Nasal polyps    hx of  . Peripheral edema    wears knee length hose-told by podiatrist to wear these  . Peripheral neuropathy    takes Gabapentin daily  . Pneumonia    as a child  . PVD (peripheral vascular disease) ([place])   . Renal insufficiency   . Seasonal allergies    takes Mucinex and Zyrtec prn  . Urinary frequency    urgency/takes Vesicare daily   Social History   Socioeconomic History  . Marital status: Married    Spouse name: Not on file  . Number of children: 2  . Years of education: Not on file  . Highest education  level: Not on file  Occupational History  . Occupation: retired Chief Executive Officer  Social Needs  . Financial resource strain: Not on file  . Food insecurity:    Worry: Not on file    Inability: Not on file  . Transportation needs:    Medical: Not on file    Non-medical: Not on file  Tobacco Use  . Smoking status: Former Smoker    Years: 40.00    Types: Cigarettes    Last attempt to quit: 10/27/2005    Years since quitting: 12.0  . Smokeless tobacco: Never Used  Substance and Sexual Activity  . Alcohol use: Yes    Alcohol/week: 1.2 - 1.8 oz    Types: 2 - 3 Cans of beer per week    Comment: occasional- weekly  . Drug use: No  . Sexual activity: Never  Lifestyle  . Physical activity:    Days per week: Not on file    Minutes per session: Not on file  . Stress: Not on file  Relationships  . Social connections:    Talks on phone: Not on file    Gets together: Not on file    Attends religious service: Not on file    Active member of club or organization: Not on file    Attends meetings of clubs or organizations: Not on file    Relationship status: Not on file  Other Topics Concern  . Not on file  Social History Narrative   He lives with wife in a 1 story home, three steps to entire home.  2 children.  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     Family History  Problem Relation Age of Onset  . Heart disease Mother   . Heart disease Father   . Anesthesia problems Neg Hx   . Hypotension Neg Hx   . Malignant hyperthermia Neg Hx   . Pseudochol deficiency Neg Hx    Scheduled Meds: . aspirin EC  325 mg Oral q1800  . atorvastatin  20 mg Oral QPM  . feeding supplement (ENSURE ENLIVE)  237 mL Oral TID BM  . feeding supplement (PRO-STAT SUGAR FREE 64)  30 mL Oral BID  . fenofibrate  54 mg Oral Daily  . [START ON 11/11/2017] furosemide  40 mg Oral Daily  . gabapentin  300 mg Oral  QHS  . gabapentin  600 mg Oral Daily  . heparin  5,000 Units Subcutaneous Q8H  . insulin aspart  0-15 Units  Subcutaneous TID WC  . levothyroxine  50 mcg Oral QAC breakfast  . loratadine  10 mg Oral Daily  . metoprolol tartrate  50 mg Oral BID  . multivitamin with minerals  1 tablet Oral Daily  . pantoprazole  40 mg Oral Daily  . pioglitazone  30 mg Oral Q lunch  . [START ON 11/11/2017] ramipril  10 mg Oral Daily  . senna-docusate  1 tablet Oral BID   Continuous Infusions: . piperacillin-tazobactam (ZOSYN)  IV 3.375 g (11/10/17 1431)   PRN Meds:.acetaminophen **OR** acetaminophen, alum & mag hydroxide-simeth, dextromethorphan-guaiFENesin, hydrALAZINE, HYDROcodone-acetaminophen, HYDROmorphone (DILAUDID) injection, labetalol, metoprolol tartrate, nitroGLYCERIN, ondansetron, oxymetazoline, phenol, polyethylene glycol, polyvinyl alcohol, temazepam Medications Prior to Admission:  Prior to Admission medications   Medication Sig Start Date End Date Taking? Authorizing Provider  aspirin 325 MG EC tablet Take 325 mg by mouth every evening.    Yes [provider]  atorvastatin (LIPITOR) 20 MG tablet Take 20 mg by mouth every evening.    Yes [provider]  cetirizine (ZYRTEC ALLERGY) 10 MG tablet Take 1 tablet (10 mg total) by mouth daily as needed for allergies. 11/01/17  Yes Rhyne, Hulen Shouts, PA-C  Cyanocobalamin (VITAMIN B 12 PO) Take 500 mcg by mouth daily.    Yes [provider]  erythromycin ophthalmic ointment USE A SMALL AMOUNT IN THE LEFT EYE DAILY AS NEEDED FOR IRRITATION 12/31/14  Yes [provider]  feeding supplement, GLUCERNA SHAKE, (GLUCERNA SHAKE) LIQD Take 237 mLs by mouth 3 (three) times daily with meals. 11/01/17  Yes Rhyne, Hulen Shouts, PA-C  fenofibrate (TRICOR) 145 MG tablet Take 145 mg by mouth every morning.   Yes [provider]  furosemide (LASIX) 40 MG tablet Take 40 mg by mouth daily as needed for fluid.    Yes [provider]  gabapentin (NEURONTIN) 300 MG capsule Take 300-600 mg by mouth See admin instructions. Take 600 mg  every morning and 300 mg every night   Yes [provider]  HYDROcodone-acetaminophen (NORCO/VICODIN) 5-325 MG tablet Take 1 tablet by mouth every 6 (six) hours as needed for severe pain. Patient taking differently: Take 1 tablet by mouth every 6 (six) hours as needed for severe pain. **Additional Order: Take 2 tablets by mouth 30 minutes prior to therapy** 11/01/17  Yes Rhyne, Samantha J, PA-C  levothyroxine (SYNTHROID, LEVOTHROID) 50 MCG tablet Take 50 mcg by mouth daily before breakfast.   Yes [provider]  loperamide (IMODIUM A-D) 2 MG tablet Take 2 mg by mouth daily.   Yes [provider]  methylcellulose (ARTIFICIAL TEARS) 1 % ophthalmic solution Place 1 drop into both eyes 2 (two) times daily as needed (dry eyes).   Yes [provider]  metoprolol (LOPRESSOR) 50 MG tablet Take 50 mg by mouth 2 (two) times daily.   Yes [provider]  nitroGLYCERIN (NITROSTAT) 0.4 MG SL tablet PLACE 1 TABLET UNDER TONGUE EVERY 5 MINUTES AS NEEDED FOR CHEST PAIN. 09/17/15  Yes Belva Crome, MD  oxymetazoline (VICKS SINEX) 0.05 % nasal spray Place 1 spray into both nostrils 2 (two) times daily as needed for congestion.   Yes [provider]  pioglitazone (ACTOS) 30 MG tablet Take 30 mg by mouth daily.    Yes [provider]  potassium chloride SA (K-DUR,KLOR-CON) 20 MEQ tablet Take 20 mEq by  mouth daily as needed (with Lasix).    Yes [provider]  pseudoephedrine-guaifenesin (MUCINEX D) 60-600 MG per tablet Take 1 tablet by mouth every 12 (twelve) hours as needed for congestion.    Yes [provider]  ramipril (ALTACE) 10 MG capsule Take 10 mg by mouth daily.   Yes [provider]  sodium chloride (OCEAN) 0.65 % SOLN nasal spray Place 1 spray into both nostrils 2 (two) times daily as needed for congestion.   Yes [provider]  temazepam (RESTORIL) 15 MG capsule Take 15 mg by mouth at bedtime as needed for  sleep.    Yes [provider]  UNABLE TO FIND Apply 1 application topically every 6 (six) hours as needed (pain). Med Name: *Ibuprofen 15% Baclofen 1% Lidocaine 10% Gabapentin 3% Topical Cream** To wrist   Yes [provider]  AMBULATORY NON FORMULARY MEDICATION 1 Units by Other route daily. Left AFO Patient not taking: Reported on 11/04/2017 08/16/17   Narda Amber K, DO   Allergies  Allergen Reactions  . Amoxicillin-Pot Clavulanate Nausea Only    Has patient had a PCN reaction causing immediate rash, facial/tongue/throat swelling, SOB or lightheadedness with hypotension: No Has patient had a PCN reaction causing severe rash involving mucus membranes or skin necrosis: No Has patient had a PCN reaction that required hospitalization: No Has patient had a PCN reaction occurring within the last 10 years: No If all of the above answers are "NO", then may proceed with Cephalosporin use.   Marland Kitchen Morphine And Related Other (See Comments)    hallucinates   Review of Systems  All other systems reviewed and are negative.   Physical Exam  Constitutional: No distress.  Pulmonary/Chest: Effort normal.  Neurological: He is alert.  Oriented.  Skin: Skin is warm and dry.    Vital Signs: BP 114/83 (BP Location: Left Arm)   Pulse (!) 59   Temp 97.9 F (36.6 C) (Axillary)   Resp 12   Ht 5\' 11"  (1.803 m)   Wt 123.4 kg (272 lb)   SpO2 99%   BMI 37.94 kg/m  Pain Scale: 0-10   Pain Score: 3    SpO2: SpO2: 99 % O2 Device:SpO2: 99 % O2 Flow Rate: .O2 Flow Rate (L/min): 2 L/min  IO: Intake/output summary:   Intake/Output Summary (Last 24 hours) at 11/10/2017 1722 Last data filed at 11/10/2017 1600 Gross per 24 hour  Intake 291.29 ml  Output 300 ml  Net -8.71 ml    LBM: Last BM Date: 11/10/17 Baseline Weight: Weight: 123.4 kg (272 lb 0.8 oz) Most recent weight: Weight: 123.4 kg (272 lb)     Palliative Assessment/Data:     Time In: 3:50 Time Out: 5:00 Time Total: 70  min Greater than 50%  of this time was spent counseling and coordinating care related to the above assessment and plan.  Signed by: Asencion Gowda, NP   Please contact Palliative Medicine Team phone at [phone] for questions and concerns.  For individual provider: See Shea Evans

## 2017-11-10 NOTE — Progress Notes (Signed)
Pt arrived back to room from PACU. Wound vac assessed on left groin. Vitals obtained. AM medications administered. Head-to-toe assessment completed. Family at bedside. All questions answered. Pt instructed to order lunch tray. Pt refused. Pt given water, per request. Will continue current plan of care.   Ara Kussmaul BSN, RN

## 2017-11-10 NOTE — Transfer of Care (Signed)
Immediate Anesthesia Transfer of Care Note  Patient: JAMARIS THEARD  Procedure(s) Performed: Virl Son DEBRIDEMENT (Left ) APPLICATION OF WOUND VAC (Left )  Patient Location: PACU  Anesthesia Type:General  Level of Consciousness: awake, alert  and oriented  Airway & Oxygen Therapy: Patient Spontanous Breathing and Patient connected to nasal cannula oxygen  Post-op Assessment: Report given to RN  Post vital signs: Reviewed and stable  Last Vitals:  Vitals Value Taken Time  BP    Temp    Pulse 67 11/10/2017 10:19 AM  Resp 18 11/10/2017 10:19 AM  SpO2 94 % 11/10/2017 10:19 AM  Vitals shown include unvalidated device data.  Last Pain:  Vitals:   11/10/17 0425  TempSrc: Oral  PainSc: 0-No pain      Patients Stated Pain Goal: 2 (76/80/88 1103)  Complications: No apparent anesthesia complications

## 2017-11-11 ENCOUNTER — Encounter (HOSPITAL_COMMUNITY): Payer: Self-pay | Admitting: Vascular Surgery

## 2017-11-11 ENCOUNTER — Telehealth: Payer: Self-pay | Admitting: Interventional Cardiology

## 2017-11-11 DIAGNOSIS — I10 Essential (primary) hypertension: Secondary | ICD-10-CM

## 2017-11-11 DIAGNOSIS — G63 Polyneuropathy in diseases classified elsewhere: Secondary | ICD-10-CM

## 2017-11-11 DIAGNOSIS — E118 Type 2 diabetes mellitus with unspecified complications: Secondary | ICD-10-CM

## 2017-11-11 LAB — GLUCOSE, CAPILLARY
GLUCOSE-CAPILLARY: 128 mg/dL — AB (ref 65–99)
Glucose-Capillary: 140 mg/dL — ABNORMAL HIGH (ref 65–99)
Glucose-Capillary: 179 mg/dL — ABNORMAL HIGH (ref 65–99)
Glucose-Capillary: 183 mg/dL — ABNORMAL HIGH (ref 65–99)

## 2017-11-11 LAB — BASIC METABOLIC PANEL
ANION GAP: 9 (ref 5–15)
BUN: 33 mg/dL — ABNORMAL HIGH (ref 6–20)
CALCIUM: 8.3 mg/dL — AB (ref 8.9–10.3)
CO2: 27 mmol/L (ref 22–32)
Chloride: 95 mmol/L — ABNORMAL LOW (ref 101–111)
Creatinine, Ser: 1.67 mg/dL — ABNORMAL HIGH (ref 0.61–1.24)
GFR calc Af Amer: 44 mL/min — ABNORMAL LOW (ref >60)
GFR calc non Af Amer: 38 mL/min — ABNORMAL LOW (ref >60)
GLUCOSE: 186 mg/dL — AB (ref 65–99)
Potassium: 4.2 mmol/L (ref 3.5–5.1)
Sodium: 131 mmol/L — ABNORMAL LOW (ref 135–145)

## 2017-11-11 MED ORDER — CEFAZOLIN SODIUM-DEXTROSE 1-4 GM/50ML-% IV SOLN
1.0000 g | Freq: Three times a day (TID) | INTRAVENOUS | Status: DC
  Administered 2017-11-11 – 2017-11-15 (×13): 1 g via INTRAVENOUS
  Filled 2017-11-11 (×17): qty 50

## 2017-11-11 NOTE — Progress Notes (Signed)
PROGRESS NOTE    [name]  BCW:[id number] DOB: 02-24-1941 DOA: 11/04/2017 PCP: Lavone Orn, MD    Brief Narrative:  [name] is an 77 y.o. male with PVD, HTN, CKD III, hyperlipidemia, DM2, hypothyroidism, carotid artery stenosis s/p CEA, and remote h/o aortofemoral bypass who had a recent L  popliteal-femoral bypass due to a nonhealing ulcer on his L great toe. His postop course has been complicated by L groin wound infection with pansensitive E. Coli and Klebsiella pneumoniae. He underwent debridement and placement of a wound vac  in the OR and has been started on IV antibiotics.  Medical service has been consulted for management of his DM and hypertension.    Assessment & Plan:   Active Problems:   Diabetes mellitus type 2 with complications (HCC)   S/P CABG (coronary artery bypass graft)   Peripheral vascular disease, unspecified (HCC)   Essential hypertension   Chronic diastolic heart failure (HCC)   Chronic kidney disease, stage III (moderate) (HCC)   Polyneuropathy associated with underlying disease ([place])   Surgical wound infection   Volume overload   Left groin wound infection: Currently on cefazolin and further management as per vascular surgery.  Wound vac to be removed tomorrow.     Mild acute on chronic diastolic heart failure:  - getting IV lasix 40 mg daily , monitor renal parameters on lasix. Creatinine slightly up from yesterday. If it continues to rise, will have to hold the lasix.  - strict intake and output.  - diuresed about 1300 ml of urine yesterday.    Stage 3 CKD: - creatinine appears to be at baseline.  - outpatient follow up with a nephrologist.    Diabetes mellitus:  CBG (last 3)  Recent Labs    11/10/17 1639 11/10/17 2159 11/11/17 0611  GLUCAP 139* 144* 140*   cbgs well controlled. Resume SSI and home meds.     Hypertension  Sub optimal , currently on lopressor, ramipril and lasix.    Hyperlipidemia  On  lipitor.   PAD: On aspirin 325 mg daily.    Polyneuropathy:  On gabapentin. Continue the same.     Hypothyroidism:  Resume synthroid.     DVT prophylaxis: heparin Code Status: full code.  Family Communication: none at bedside.  Disposition Plan: per vascular surgery.   Procedures: L  popliteal-femoral bypass     Antimicrobials: cefazolin    Subjective:  no new complaints.   Objective: Vitals:   11/10/17 1135 11/10/17 1150 11/10/17 1955 11/11/17 0359  BP: 114/83  (!) 144/54 (!) 154/61  Pulse: 60 (!) 59 72 70  Resp: 12  17 20   Temp: 97.9 F (36.6 C)  97.7 F (36.5 C) 97.8 F (36.6 C)  TempSrc: Axillary  Oral Oral  SpO2: 99%  100% 93%  Weight:      Height:        Intake/Output Summary (Last 24 hours) at 11/11/2017 1107 Last data filed at 11/11/2017 0500 Gross per 24 hour  Intake 421.53 ml  Output 1130 ml  Net -708.47 ml   Filed Weights   11/05/17 0504 11/05/17 1213  Weight: 123.4 kg (272 lb 0.8 oz) 123.4 kg (272 lb)    Examination:  General exam: Appears calm and comfortable  Respiratory system: Clear to auscultation. Respiratory effort normal. Cardiovascular system: S1 & S2 heard, RRR. No JVD, murmurs,  Gastrointestinal system: Abdomen is nondistended, soft and nontender. No organomegaly or masses felt. Normal bowel sounds heard. Central nervous system: Alert  and oriented. No focal neurological deficits. Extremities: Symmetric 5 x 5 power. Skin: No rashes, lesions or ulcers Psychiatry:  Mood & affect appropriate.     Data Reviewed: I have personally reviewed following labs and imaging studies  CBC: Recent Labs  Lab 11/04/17 1819 11/09/17 0323 11/10/17 0336  WBC 9.5 8.5 10.1  HGB 10.3* 9.8* 9.8*  HCT 32.6* 31.3* 31.8*  MCV 93.9 94.6 95.8  PLT 319 312 037   Basic Metabolic Panel: Recent Labs  Lab 11/04/17 1819 11/06/17 0206 11/09/17 0323 11/10/17 0336  NA 134* 131* 132* 133*  K 4.5 4.8 4.8 4.5  CL 99* 101 101 102  CO2 26 21*  22 23  GLUCOSE 162* 229* 148* 139*  BUN 29* 34* 38* 36*  CREATININE 1.39* 1.49* 1.61* 1.48*  CALCIUM 8.5* 7.9* 8.2* 8.2*   GFR: Estimated Creatinine Clearance: 55 mL/min (A) (by C-G formula based on SCr of 1.48 mg/dL (H)). Liver Function Tests: Recent Labs  Lab 11/04/17 1819 11/09/17 0323  AST 28  --   ALT 21  --   ALKPHOS 38  --   BILITOT 1.3*  --   PROT 5.7*  --   ALBUMIN 2.2* 2.0*   No results for input(s): LIPASE, AMYLASE in the last 168 hours. No results for input(s): AMMONIA in the last 168 hours. Coagulation Profile: Recent Labs  Lab 11/04/17 1819 11/10/17 0336  INR 1.20 1.27   Cardiac Enzymes: No results for input(s): CKTOTAL, CKMB, CKMBINDEX, TROPONINI in the last 168 hours. BNP (last 3 results) No results for input(s): PROBNP in the last 8760 hours. HbA1C: No results for input(s): HGBA1C in the last 72 hours. CBG: Recent Labs  Lab 11/10/17 1030 11/10/17 1136 11/10/17 1639 11/10/17 2159 11/11/17 0611  GLUCAP 127* 125* 139* 144* 140*   Lipid Profile: No results for input(s): CHOL, HDL, LDLCALC, TRIG, CHOLHDL, LDLDIRECT in the last 72 hours. Thyroid Function Tests: No results for input(s): TSH, T4TOTAL, FREET4, T3FREE, THYROIDAB in the last 72 hours. Anemia Panel: No results for input(s): VITAMINB12, FOLATE, FERRITIN, TIBC, IRON, RETICCTPCT in the last 72 hours. Sepsis Labs: No results for input(s): PROCALCITON, LATICACIDVEN in the last 168 hours.  Recent Results (from the past 240 hour(s))  Surgical pcr screen     Status: None   Collection Time: 11/05/17  1:32 AM  Result Value Ref Range Status   MRSA, PCR NEGATIVE NEGATIVE Final   Staphylococcus aureus NEGATIVE NEGATIVE Final    Comment: (NOTE) The Xpert SA Assay (FDA approved for NASAL specimens in patients 11 years of age and older), is one component of a comprehensive surveillance program. It is not intended to diagnose infection nor to guide or monitor treatment. Performed at [facility], [place] [address], [place], [place] [address]   Aerobic/Anaerobic Culture (surgical/deep wound)     Status: None   Collection Time: 11/05/17  1:13 PM  Result Value Ref Range Status   Specimen Description GROIN  Final   Special Requests NONE  Final   Gram Stain   Final    MODERATE WBC PRESENT,BOTH PMN AND MONONUCLEAR FEW GRAM VARIABLE ROD    Culture   Final    MODERATE KLEBSIELLA PNEUMONIAE MODERATE ESCHERICHIA COLI NO ANAEROBES ISOLATED Performed at [facility], [place] [address], [place], [place] [address]    Report Status 11/10/2017 FINAL  Final   Organism ID, Bacteria KLEBSIELLA PNEUMONIAE  Final   Organism ID, Bacteria ESCHERICHIA COLI  Final  Susceptibility   Escherichia coli - MIC*    AMPICILLIN 8 SENSITIVE Sensitive     CEFAZOLIN <=4 SENSITIVE Sensitive     CEFEPIME <=1 SENSITIVE Sensitive     CEFTAZIDIME <=1 SENSITIVE Sensitive     CEFTRIAXONE <=1 SENSITIVE Sensitive     CIPROFLOXACIN <=0.25 SENSITIVE Sensitive     GENTAMICIN <=1 SENSITIVE Sensitive     IMIPENEM <=0.25 SENSITIVE Sensitive     TRIMETH/SULFA <=20 SENSITIVE Sensitive     AMPICILLIN/SULBACTAM 4 SENSITIVE Sensitive     PIP/TAZO <=4 SENSITIVE Sensitive     Extended ESBL NEGATIVE Sensitive     * MODERATE ESCHERICHIA COLI   Klebsiella pneumoniae - MIC*    AMPICILLIN >=32 RESISTANT Resistant     CEFAZOLIN <=4 SENSITIVE Sensitive     CEFEPIME <=1 SENSITIVE Sensitive     CEFTAZIDIME <=1 SENSITIVE Sensitive     CEFTRIAXONE <=1 SENSITIVE Sensitive     CIPROFLOXACIN <=0.25 SENSITIVE Sensitive     GENTAMICIN <=1 SENSITIVE Sensitive     IMIPENEM <=0.25 SENSITIVE Sensitive     TRIMETH/SULFA <=20 SENSITIVE Sensitive     AMPICILLIN/SULBACTAM 4 SENSITIVE Sensitive     PIP/TAZO <=4 SENSITIVE Sensitive     Extended ESBL NEGATIVE Sensitive     * MODERATE KLEBSIELLA PNEUMONIAE  Aerobic Culture (superficial specimen)     Status: None (Preliminary result)   Collection Time: 11/10/17  9:53 AM  Result  Value Ref Range Status   Specimen Description WOUND LEFT GROIN  Final   Special Requests NONE  Final   Gram Stain   Final    NO WBC SEEN NO ORGANISMS SEEN Performed at [facility], [address], [place], [place] [address]    Culture PENDING  Incomplete   Report Status PENDING  Incomplete         Radiology Studies: No results found.      Scheduled Meds: . aspirin EC  325 mg Oral q1800  . atorvastatin  20 mg Oral QPM  . feeding supplement (ENSURE ENLIVE)  237 mL Oral TID BM  . feeding supplement (PRO-STAT SUGAR FREE 64)  30 mL Oral BID  . fenofibrate  54 mg Oral Daily  . furosemide  40 mg Oral Daily  . gabapentin  300 mg Oral QHS  . gabapentin  600 mg Oral Daily  . heparin  5,000 Units Subcutaneous Q8H  . insulin aspart  0-15 Units Subcutaneous TID WC  . levothyroxine  50 mcg Oral QAC breakfast  . loratadine  10 mg Oral Daily  . metoprolol tartrate  50 mg Oral BID  . multivitamin with minerals  1 tablet Oral Daily  . pantoprazole  40 mg Oral Daily  . pioglitazone  30 mg Oral Q lunch  . ramipril  10 mg Oral Daily  . senna-docusate  1 tablet Oral BID   Continuous Infusions: .  ceFAZolin (ANCEF) IV       LOS: 7 days    Time spent: 30 MINUTES.     Hosie Poisson, MD [facility] Pager [phone]  If 7PM-7AM, please contact night-coverage [email or web address] Password [email or web address] 11/11/2017, 11:07 AM

## 2017-11-11 NOTE — Progress Notes (Signed)
Occupational Therapy Treatment Patient Details Name: [name] MRN: [id number] DOB: 06/16/1940 Today's Date: 11/11/2017    History of present illness 77 y.o. patient who underwent left common femoral endarterectomy and left femoral to below-knee popliteal bypass 6/03, discharged to SNF, returns for follow up with left groin wound infection, debrided and wound vac placed 6/14. PMH includes: foot drop L, DM, peripheral neuropathy, PVD, L food wounds, B RTC repair   OT comments  Pt making progress with functional goals. Sit - stand to [facility] with mod A + 2 for transfer to [facility] and to recliner. Pt required total A for pericare/hygiene after BM. Pt able to stand upright in [place] pushing through UEs. Pt required cues for safety and with increased time and effort to complete tasks. OT will continue to follow acutely  Follow Up Recommendations  SNF;Supervision/Assistance - 24 hour    Equipment Recommendations  Other (comment)(TBD at next venue of care)    Recommendations for Other Services      Precautions / Restrictions Precautions Precautions: Fall Restrictions Weight Bearing Restrictions: No LLE Weight Bearing: Weight bearing as tolerated       Mobility Bed Mobility Overal bed mobility: Needs Assistance Bed Mobility: Sit to Supine     Supine to sit: Max assist;+2 for physical assistance;HOB elevated;+2 for safety/equipment     General bed mobility comments: assist with LEs off EOB and to elevate trunk  Transfers Overall transfer level: Needs assistance Equipment used: Ambulation equipment used Transfers: Sit to/from Stand Sit to Stand: +2 physical assistance;Mod assist         General transfer comment: raised bed, Incr time and effort to achieve standing from bed and BSC    Balance Overall balance assessment: Needs assistance Sitting-balance support: Bilateral upper extremity supported;Feet supported Sitting balance-Leahy Scale: Fair Sitting balance - Comments: UE  support sitting EOB   Standing balance support: Bilateral upper extremity supported;During functional activity Standing balance-Leahy Scale: Poor Standing balance comment: UE support on Stedy and +2 mod assist                           ADL either performed or assessed with clinical judgement   ADL Overall ADL's : Needs assistance/impaired     Grooming: Set up;Supervision/safety;Sitting;Wash/dry hands;Wash/dry Sports administrator: +2 for physical assistance;BSC;Moderate assistance(used [employer]) Armed forces technical officer Details (indicate cue type and reason): pt requird increased effort and time Toileting- Water quality scientist and Hygiene: +2 for physical assistance;Sit to/from stand;Total assistance Toileting - Clothing Manipulation Details (indicate cue type and reason): pt in [place], total A for hygiene and peicare after BM     Functional mobility during ADLs: +2 for safety/equipment;+2 for physical assistance;Cueing for safety;Cueing for sequencing;Moderate assistance General ADL Comments: mod A +2 for sit - stand to [employer] lift     Vision Baseline Vision/History: Wears glasses Patient Visual Report: No change from baseline     Perception     Praxis      Cognition Arousal/Alertness: Awake/alert Behavior During Therapy: WFL for tasks assessed/performed Overall Cognitive Status: Within Functional Limits for tasks assessed Area of Impairment: Following commands;Problem solving                       Following Commands: Follows one step commands with increased time;Follows one step commands inconsistently     Problem Solving: Requires verbal cues;Requires  tactile cues          Exercises     Shoulder Instructions       General Comments      Pertinent Vitals/ Pain       Pain Assessment: Faces Faces Pain Scale: Hurts whole lot Pain Location: L groin, LE Pain Descriptors / Indicators: Grimacing;Moaning;Guarding;Constant Pain  Intervention(s): Limited activity within patient's tolerance;Monitored during session;Premedicated before session;Repositioned  Home Living                                          Prior Functioning/Environment              Frequency  Min 2X/week        Progress Toward Goals  OT Goals(current goals can now be found in the care plan section)  Progress towards OT goals: Progressing toward goals     Plan Discharge plan remains appropriate    Co-evaluation    PT/OT/SLP Co-Evaluation/Treatment: Yes Reason for Co-Treatment: For patient/therapist safety;To address functional/ADL transfers   OT goals addressed during session: ADL's and self-care;Proper use of Adaptive equipment and DME      AM-PAC PT "6 Clicks" Daily Activity     Outcome Measure   Help from another person eating meals?: None Help from another person taking care of personal grooming?: A Little Help from another person toileting, which includes using toliet, bedpan, or urinal?: Total Help from another person bathing (including washing, rinsing, drying)?: A Lot Help from another person to put on and taking off regular upper body clothing?: A Little Help from another person to put on and taking off regular lower body clothing?: Total 6 Click Score: 14    End of Session Equipment Utilized During Treatment: Gait belt;Other (comment)(Stedy lift)  OT Visit Diagnosis: Unsteadiness on feet (R26.81);Other abnormalities of gait and mobility (R26.89);Muscle weakness (generalized) (M62.81);Pain Pain - Right/Left: Left Pain - part of body: Leg   Activity Tolerance Patient limited by pain   Patient Left in chair;with call bell/phone within reach;with family/visitor present   Nurse Communication      Functional Assessment Tool Used: AM-PAC 6 Clicks Daily Activity   Time: 7121-9758 OT Time Calculation (min): 55 min  Charges: OT G-codes **NOT FOR INPATIENT CLASS** Functional Assessment Tool  Used: AM-PAC 6 Clicks Daily Activity OT General Charges $OT Visit: 1 Visit OT Treatments $Self Care/Home Management : 8-22 mins $Therapeutic Activity: 8-22 mins     Britt Bottom 11/11/2017, 2:24 PM

## 2017-11-11 NOTE — Progress Notes (Addendum)
Physical Therapy Treatment Patient Details Name: [name] MRN: [id number] DOB: [birth year] Today's Date: 11/11/2017    History of Present Illness 77 y.o. patient who underwent left common femoral endarterectomy and left femoral to below-knee popliteal bypass 6/03, discharged to SNF, returns for follow up with left groin wound infection, debrided and wound vac placed 6/14. PMH includes: foot drop L, DM, peripheral neuropathy, PVD, L food wounds, B RTC repair    PT Comments    Pt received in bed and agreeable to participation in therapy. Pt voices need for BM. Pt assisted OOB utilizing stedy for transfers. Pt required +2 mod assist for sit to stand x 3 trials. Pt dependent with toilet hygiene. Pt positioned in recliner with feet elevated at end of session. PT goals reviewed and remain appropriate.    Follow Up Recommendations  SNF;Supervision/Assistance - 24 hour     Equipment Recommendations  Other (comment)(defer to next venue)    Recommendations for Other Services       Precautions / Restrictions Precautions Precautions: Fall;Other (comment) Precaution Comments: wound vac L groin Restrictions Weight Bearing Restrictions: No LLE Weight Bearing: Weight bearing as tolerated    Mobility  Bed Mobility Overal bed mobility: Needs Assistance Bed Mobility: Supine to Sit     Supine to sit: Max assist;+2 for physical assistance;HOB elevated     General bed mobility comments: cues for sequencing, assist with BLE and to elevate trunk  Transfers Overall transfer level: Needs assistance Equipment used: Ambulation equipment used Transfers: Sit to/from Stand Sit to Stand: +2 physical assistance;Mod assist         General transfer comment: sit to stand multi trials for bed to [facility] to recliner with stedy. Increased assist required as pt fatigued. Increased time to attain full upright stance  Ambulation/Gait                 Stairs             Wheelchair  Mobility    Modified Rankin (Stroke Patients Only)       Balance Overall balance assessment: Needs assistance Sitting-balance support: Bilateral upper extremity supported;Feet supported Sitting balance-Leahy Scale: Fair Sitting balance - Comments: UE support sitting EOB   Standing balance support: Bilateral upper extremity supported;During functional activity Standing balance-Leahy Scale: Poor Standing balance comment: UE support on Stedy and +2 mod assist                            Cognition Arousal/Alertness: Awake/alert Behavior During Therapy: WFL for tasks assessed/performed Overall Cognitive Status: Within Functional Limits for tasks assessed Area of Impairment: Following commands;Problem solving                       Following Commands: Follows one step commands with increased time;Follows one step commands inconsistently     Problem Solving: Requires verbal cues;Requires tactile cues        Exercises      General Comments        Pertinent Vitals/Pain Pain Assessment: Faces Faces Pain Scale: Hurts whole lot Pain Location: L groin, LE Pain Descriptors / Indicators: Grimacing;Moaning;Guarding;Tender Pain Intervention(s): Limited activity within patient's tolerance;Monitored during session;Premedicated before session;Repositioned    Home Living                      Prior Function            PT Goals (current goals  can now be found in the care plan section) Acute Rehab PT Goals Patient Stated Goal: Decrease pain PT Goal Formulation: With patient/family Time For Goal Achievement: 11/22/17 Potential to Achieve Goals: Good Progress towards PT goals: Progressing toward goals    Frequency    Min 2X/week      PT Plan Current plan remains appropriate    Co-evaluation PT/OT/SLP Co-Evaluation/Treatment: Yes Reason for Co-Treatment: For patient/therapist safety;To address functional/ADL transfers PT goals addressed during  session: Mobility/safety with mobility;Balance OT goals addressed during session: ADL's and self-care;Proper use of Adaptive equipment and DME      AM-PAC PT "6 Clicks" Daily Activity  Outcome Measure  Difficulty turning over in bed (including adjusting bedclothes, sheets and blankets)?: Unable Difficulty moving from lying on back to sitting on the side of the bed? : Unable Difficulty sitting down on and standing up from a chair with arms (e.g., wheelchair, bedside commode, etc,.)?: Unable Help needed moving to and from a bed to chair (including a wheelchair)?: A Lot Help needed walking in hospital room?: Total Help needed climbing 3-5 steps with a railing? : Total 6 Click Score: 7    End of Session Equipment Utilized During Treatment: Gait belt Activity Tolerance: Patient tolerated treatment well Patient left: in chair;with call bell/phone within reach;with family/visitor present Nurse Communication: Mobility status;Need for lift equipment PT Visit Diagnosis: Unsteadiness on feet (R26.81);Other abnormalities of gait and mobility (R26.89);Pain Pain - Right/Left: Left Pain - part of body: Hip     Time: 8841-6606 PT Time Calculation (min) (ACUTE ONLY): 57 min  Charges:  $Therapeutic Activity: 23-37 mins                    G Codes:       [name], PT  Office # [phone] Pager [phone]    [name] 11/11/2017, 3:19 PM

## 2017-11-11 NOTE — Progress Notes (Addendum)
Daily Progress Note   Patient Name: [name]       Date: 11/11/2017 DOB: 07/28/1940  Age: 77 y.o. MRN#: [id number] Attending Physician: Elam Dutch, MD Primary Care Physician: Lavone Orn, MD Admit Date: 11/04/2017  Reason for Consultation/Follow-up: Establishing goals of care  Subjective: Patient is resting in bed. 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 He states he was unable to remember the punch line for his arguments. He was a Administrator for around 50 years. He states he and his wife have been married 33 years. He has 2 children from a previous marriage and she has 3. He states he enjoys going to the beach annually with his family and working in his wood working shop. He states he made furniture for is law office.   He uses a Engineer, civil (consulting) as of 2019 after around 15 falls. His wife states he was diagnosed with drop foot, and has not been able to use a brace due to pain in the leg. They voice concern over a wound on his foot. His wife is fearful of infection migrating to his AAA graft.   Inquired of spiritual support for him and his family. He states he was raised [place]. He states he took a religions course and believes in God; he believes Jesus was great man, but only a man. He states after death he feels like the spirit floats around eternally.    Discussed goals of care. He states he has a POA and asked if that discussed advanced directives. Explained hospital advanced directives and POA form that can be filled out with the chaplain, witnesses, and a notary. The patient became upset that these forms would be filled out without an attorney. He then calmed and stated that perhaps the law has changed since he practiced.  He stated he does  have a living will that discusses terminal or incurable issues.   Attempted to discuss other advanced directives such as artifical feeding and hydration, dialysis, ventilator, or code status.  The difference between an aggressive medical intervention path and a  comfort care path was dicussed.    Much time was spent attempting to explain that advanced directives encompass care prior to terminal and incurable care and a desire to know wishes for care at any  stage in a disease or illness process. It was explained that it could be helpful for family to know their loved one's wishes in the case they were even temporarily unable to make decisions such as with delirium. He asked his wife if she knew his wishes. She indicated she had some knowledge, stating she would want to focus on comfort if she were unable to care for herself. She asked him if he would want to continue living if both of his legs were amputated. He stated he would, as others have such as the famous scientist that used the computer because he could not speak. His wife indicated that this man had much more money than they did. She asked if he would want to live in a nursing home and he indicated he would if he needed to in order to live.   He states there are too many variables to discuss any planning. He asked if there was any paperwork we use besides the living will and was given a blank MOST form. He states he will review the form.   His wife stepped outside and stated that her husband is an Forensic psychologist. Discussed the importance of she and other family members knowing his wishes. She stated she understood, that she was "just the person that would be stuck having to make all the decisions".  She was appreciative of the visit.    Length of Stay: 7  Current Medications: Scheduled Meds:  . aspirin EC  325 mg Oral q1800  . atorvastatin  20 mg Oral QPM  . feeding supplement (PRO-STAT SUGAR FREE 64)  30 mL Oral BID  . fenofibrate  54 mg Oral  Daily  . furosemide  40 mg Oral Daily  . gabapentin  300 mg Oral QHS  . gabapentin  600 mg Oral Daily  . heparin  5,000 Units Subcutaneous Q8H  . insulin aspart  0-15 Units Subcutaneous TID WC  . levothyroxine  50 mcg Oral QAC breakfast  . loratadine  10 mg Oral Daily  . metoprolol tartrate  50 mg Oral BID  . multivitamin with minerals  1 tablet Oral Daily  . pantoprazole  40 mg Oral Daily  . pioglitazone  30 mg Oral Q lunch  . ramipril  10 mg Oral Daily  . senna-docusate  1 tablet Oral BID    Continuous Infusions: .  ceFAZolin (ANCEF) IV      PRN Meds: acetaminophen **OR** acetaminophen, alum & mag hydroxide-simeth, dextromethorphan-guaiFENesin, hydrALAZINE, HYDROcodone-acetaminophen, HYDROmorphone (DILAUDID) injection, labetalol, metoprolol tartrate, nitroGLYCERIN, ondansetron, oxymetazoline, phenol, polyethylene glycol, polyvinyl alcohol, temazepam  Physical Exam  Constitutional: No distress.  Pulmonary/Chest: Effort normal.  Neurological: He is alert.            Vital Signs: BP (!) 154/61 (BP Location: Left Arm)   Pulse 70   Temp 97.8 F (36.6 C) (Oral)   Resp 20   Ht 5\' 11"  (1.803 m)   Wt 123.4 kg (272 lb)   SpO2 93%   BMI 37.94 kg/m  SpO2: SpO2: 93 % O2 Device: O2 Device: Room Air O2 Flow Rate: O2 Flow Rate (L/min): 2 L/min  Intake/output summary:   Intake/Output Summary (Last 24 hours) at 11/11/2017 1221 Last data filed at 11/11/2017 1000 Gross per 24 hour  Intake 611.53 ml  Output 1481 ml  Net -869.47 ml   LBM: Last BM Date: 11/10/17 Baseline Weight: Weight: 123.4 kg (272 lb 0.8 oz) Most recent weight: Weight: 123.4 kg (272 lb)  Palliative Assessment/Data: 50%      Patient Active Problem List   Diagnosis Date Noted  . Volume overload 11/10/2017  . Surgical wound infection 11/04/2017  . PAD (peripheral artery disease) ([place]) 10/25/2017  . Polyneuropathy associated with underlying disease ([place]) 08/16/2017  . Essential hypertension  02/06/2014  . Chronic diastolic heart failure ([place]) 02/06/2014  . Chronic kidney disease, stage III (moderate) ([place]) 02/06/2014  . Aftercare following surgery of the circulatory system, [place] 01/25/2014  . Carotid stenosis 06/08/2013  . Stricture of artery ([place]) 04/14/2012  . Peripheral vascular disease, unspecified ([place]) 10/08/2011  . DVT of upper extremity (deep vein thrombosis), left 09/29/2011  . S/P CABG (coronary artery bypass graft) 09/29/2011  . H/O carotid endarterectomy 09/29/2011  . Occlusion and stenosis of carotid artery without mention of cerebral infarction 06/11/2011  . Diabetes mellitus type 2 with complications ([place]) 59/56/3875    Palliative Care Assessment & Plan   Patient Profile: Patient is a 77 year old male who returns for postoperative follow-up today. He underwent left common femoral endarterectomy and left femoral to below-knee popliteal bypass with vein 10 days ago. [facility].   Assessment/Recommendations/Plan:  Continue full scope of treatment.     Code Status:    Code Status Orders  (From admission, onward)        Start     Ordered   11/04/17 1754  Full code  Continuous     11/04/17 1754    Code Status History    Date Active Date Inactive Code Status Order ID Comments User Context   10/25/2017 1600 11/02/2017 1432 Full Code [id number]  Iline Oven Inpatient   10/11/2017 1646 10/11/2017 2348 Full Code [id number]  Waynetta Sandy, MD Inpatient   09/21/2011 0817 09/29/2011 1628 Full Code [id number]  [name], MD Inpatient   09/14/2011 1555 09/21/2011 0817 Full Code [id number]  Lemont Fillers, RN Inpatient   04/08/2011 1702 04/09/2011 1416 Full Code [id number]  Love, Kaylyn Layer, RN Inpatient    Advance Directive Documentation     Most Recent Value  Type of Advance Directive  Healthcare Power of Attorney  Pre-existing out of facility DNR order (yellow form or pink MOST form)  -    "MOST" Form in Place?  -       Prognosis:   Unable to determine  Discharge Planning:  To Be Determined   Thank you for allowing the Palliative Medicine Team to assist in the care of this patient.   Time In: 11:00 Time Out: 12:10 Total Time 70 min Prolonged Time Billed  yes      Greater than 50%  of this time was spent counseling and coordinating care related to the above assessment and plan.  Asencion Gowda, NP  Please contact Palliative Medicine Team phone at [phone] for questions and concerns.

## 2017-11-11 NOTE — Telephone Encounter (Signed)
New Message    Per referral que called patient to set up appointment to see Dr. Tamala Julian. But the patient is in the hospital at this time and wanted Dr. Tamala Julian to know just in case Dr. Tamala Julian wants to see him at the hospital.

## 2017-11-11 NOTE — Progress Notes (Addendum)
  Progress Note    11/11/2017 7:34 AM 1 Day Post-Op  Subjective:  Says his back hurts; wants to know the schedule of the vac change  Afebrile HR 60's-70's NSR 448'J-856'D systolic 149% RA  Vitals:   11/10/17 1955 11/11/17 0359  BP: (!) 144/54 (!) 154/61  Pulse: 72 70  Resp: 17 20  Temp: 97.7 F (36.5 C) 97.8 F (36.6 C)  SpO2: 100% 93%    Physical Exam: Cardiac:  regular Lungs:  Non labored Incisions:  Saphenectomy incisions are clean with staples in tact; left groin with vac with good seal. Extremities:  Easily palpable left DP pulse; +edema LLE   CBC    Component Value Date/Time   WBC 10.1 11/10/2017 0336   RBC 3.32 (L) 11/10/2017 0336   HGB 9.8 (L) 11/10/2017 0336   HCT 31.8 (L) 11/10/2017 0336   PLT 374 11/10/2017 0336   MCV 95.8 11/10/2017 0336   MCH 29.5 11/10/2017 0336   MCHC 30.8 11/10/2017 0336   RDW 17.2 (H) 11/10/2017 0336   LYMPHSABS 0.7 10/28/2017 2219   MONOABS 0.8 10/28/2017 2219   EOSABS 0.1 10/28/2017 2219   BASOSABS 0.0 10/28/2017 2219    BMET    Component Value Date/Time   NA 133 (L) 11/10/2017 0336   K 4.5 11/10/2017 0336   CL 102 11/10/2017 0336   CO2 23 11/10/2017 0336   GLUCOSE 139 (H) 11/10/2017 0336   BUN 36 (H) 11/10/2017 0336   CREATININE 1.48 (H) 11/10/2017 0336   CREATININE 1.32 04/18/2012 0949   CALCIUM 8.2 (L) 11/10/2017 0336   GFRNONAA 44 (L) 11/10/2017 0336   GFRAA 50 (L) 11/10/2017 0336    INR    Component Value Date/Time   INR 1.27 11/10/2017 0336     Intake/Output Summary (Last 24 hours) at 11/11/2017 0734 Last data filed at 11/11/2017 0500 Gross per 24 hour  Intake 421.53 ml  Output 1180 ml  Net -758.47 ml   Specimen Description WOUND LEFT GROIN   Special Requests NONE   Gram Stain NO WBC SEEN  NO ORGANISMS SEEN  Performed at [facility], [address], [place], [place] [address]     Culture PENDING   Report Status PENDING      Assessment:  77 y.o. male is s/p:  Debridement left  groin, placement of VAC dressing  1 Day Post-Op   Plan: -pt doing well this am.  Wound vac has good seal. Will change vac tomorrow. -gram stain on wound cx from OR yesterday does not have any WBC or organisms. -appreciate TRH assistance with pt -continue IV abx given high risk for aortic graft infection. -awaiting nutrition and palliative consults -DVT prophylaxis:  Sq heparin    Leontine Locket, PA-C [facility] [phone] 11/11/2017 7:34 AM  Agree with above. He seems to feel better.  VAC change tomorrow.  Continue antibiotics Appreciate medical service assistance.  Ruta Hinds, MD [facility] [place] Office: [phone] Pager: [phone]

## 2017-11-11 NOTE — Progress Notes (Signed)
Nutrition Consult/Follow Up  DOCUMENTATION CODES:   Obesity unspecified  INTERVENTION:    Atkins Shakes po TID, each supplement provides 160 kcal and 15 grams of protein  Prostat liquid protein po 30 ml BID with meals, each supplement provides 100 kcal, 15 grams protein   D/C Ensure Enlive supplements  NUTRITION DIAGNOSIS:   Increased nutrient needs related to wound healing as evidenced by estimated needs, onoging  GOAL:   Patient will meet greater than or equal to 90% of their needs, progressing  MONITOR:   PO intake, Supplement acceptance, Labs, Weight trends  ASSESSMENT:   77 yo male post left common femoral endarterectomy and left femoral to below-knee popliteal bypass with vein 10 days ago admitted with poorly healing left groin. Pt with hx of DM, CAD s/p CABG, HTN, CHF, CKD III, PAD  Pt s/p procedure 6/14: DEBRIDEMENT WOUND LEFT GROIN   RD spoke with pt at bedside. Several family members in room. Pt seems irritated with RD visit. He reports his appetite is not good. PO intake variable but averaging 75% per flowsheet records.  Doesn't like Ensure Enlive. Drinking Atkins Shakes (family is bringing). Doesn't particularly like Prostat liquid protein either. Asked RD "have you tried it?" RD stressed importance of wound healing. Family members encouraged pt to take with juice.  Palliative Medicine Team note reviewed. Labs and medications reviewed. CBG's [id number].  NUTRITION - FOCUSED PHYSICAL EXAM:  Completed. No muscle or fat depletions noticed.  Diet Order:   Diet Order           Diet regular Room service appropriate? Yes; Fluid consistency: Thin  Diet effective now         EDUCATION NEEDS:   Not appropriate for education at this time  Skin:  Skin Assessment: Skin Integrity Issues: Skin Integrity Issues:: Wound VAC, Other (Comment) Wound Vac: groin Other: venous stasis ulcers on foot, toe  Last BM:  6/19  Height:   Ht Readings from Last 1  Encounters:  11/05/17 5\' 11"  (1.803 m)    Weight:   Wt Readings from Last 1 Encounters:  11/05/17 272 lb (123.4 kg)    Ideal Body Weight:  78.2 kg  BMI:  Body mass index is 37.94 kg/m.  Estimated Nutritional Needs:   Kcal:  2000-2200 kcals  Protein:  115-130 g  Fluid:  >/= 2 L  Arthur Holms, RD, LDN Pager #: [phone] After-Hours Pager #: [phone]

## 2017-11-12 ENCOUNTER — Telehealth: Payer: Self-pay | Admitting: *Deleted

## 2017-11-12 LAB — BASIC METABOLIC PANEL
Anion gap: 9 (ref 5–15)
BUN: 35 mg/dL — AB (ref 6–20)
CALCIUM: 8.1 mg/dL — AB (ref 8.9–10.3)
CHLORIDE: 97 mmol/L — AB (ref 101–111)
CO2: 24 mmol/L (ref 22–32)
CREATININE: 1.59 mg/dL — AB (ref 0.61–1.24)
GFR calc Af Amer: 46 mL/min — ABNORMAL LOW (ref >60)
GFR calc non Af Amer: 40 mL/min — ABNORMAL LOW (ref >60)
GLUCOSE: 167 mg/dL — AB (ref 65–99)
Potassium: 4.4 mmol/L (ref 3.5–5.1)
Sodium: 130 mmol/L — ABNORMAL LOW (ref 135–145)

## 2017-11-12 LAB — GLUCOSE, CAPILLARY
GLUCOSE-CAPILLARY: 150 mg/dL — AB (ref 65–99)
GLUCOSE-CAPILLARY: 146 mg/dL — AB (ref 65–99)
Glucose-Capillary: 166 mg/dL — ABNORMAL HIGH (ref 65–99)
Glucose-Capillary: 222 mg/dL — ABNORMAL HIGH (ref 65–99)

## 2017-11-12 LAB — CBC
HEMATOCRIT: 34.5 % — AB (ref 39.0–52.0)
Hemoglobin: 11 g/dL — ABNORMAL LOW (ref 13.0–17.0)
MCH: 30.4 pg (ref 26.0–34.0)
MCHC: 31.9 g/dL (ref 30.0–36.0)
MCV: 95.3 fL (ref 78.0–100.0)
Platelets: 323 10*3/uL (ref 150–400)
RBC: 3.62 MIL/uL — ABNORMAL LOW (ref 4.22–5.81)
RDW: 17.9 % — AB (ref 11.5–15.5)
WBC: 6.7 10*3/uL (ref 4.0–10.5)

## 2017-11-12 LAB — AEROBIC CULTURE  (SUPERFICIAL SPECIMEN)

## 2017-11-12 LAB — AEROBIC CULTURE W GRAM STAIN (SUPERFICIAL SPECIMEN): Gram Stain: NONE SEEN

## 2017-11-12 MED ORDER — SODIUM CHLORIDE 0.9 % IV SOLN
INTRAVENOUS | Status: DC | PRN
Start: 2017-11-12 — End: 2017-12-03
  Administered 2017-11-12: 1000 mL via INTRAVENOUS
  Administered 2017-11-15: 600 mL via INTRAVENOUS

## 2017-11-12 MED ORDER — DOCUSATE SODIUM 100 MG PO CAPS
100.0000 mg | ORAL_CAPSULE | Freq: Two times a day (BID) | ORAL | Status: DC | PRN
  Administered 2017-11-13 – 2017-11-16 (×2): 100 mg via ORAL
  Filled 2017-11-12 (×2): qty 1

## 2017-11-12 NOTE — Telephone Encounter (Signed)
Patient's daughter called to let us know that patient has been in the hospital since June 3.  She has cancelled his July appointment but will follow up when patient is able.

## 2017-11-12 NOTE — Consult Note (Signed)
   [facility] Inpatient Consult   11/12/2017  ANCELMO HUNT 06/27/1940 [id number]  Patient screened for re hospitalization and extreme high risk score for unplanned re-admission  (30%) in the [place]. Patient is a re admission from a skilled nursing facility for left groin wound infection.  Currently PT's recommendations is for skilled nursing care. Following for disposition and needs, as appropriate.    Please place a [facility] Care Management consult or for questions contact:   Natividad Brood, RN BSN [facility] Liaison  [phone] business mobile phone Toll free office [phone]

## 2017-11-12 NOTE — Telephone Encounter (Signed)
Will route to Dr. Smith to make him aware. 

## 2017-11-12 NOTE — Progress Notes (Signed)
Physical Therapy Treatment Patient Details Name: [name] MRN: [id number] DOB: 02/15/1941 Today's Date: 11/12/2017    History of Present Illness 77 y.o. patient who underwent left common femoral endarterectomy and left femoral to below-knee popliteal bypass 6/03, discharged to SNF, returns for follow up with left groin wound infection, debrided and wound vac placed 6/14. PMH includes: foot drop L, DM, peripheral neuropathy, PVD, L food wounds, B RTC repair    PT Comments    Pt making slow, steady progress. Able to walk a few steps with walker today.    Follow Up Recommendations  SNF;Supervision/Assistance - 24 hour     Equipment Recommendations  Other (comment)(TBD at SNF)    Recommendations for Other Services       Precautions / Restrictions Precautions Precautions: Fall    Mobility  Bed Mobility Overal bed mobility: Needs Assistance Bed Mobility: Supine to Sit     Supine to sit: Mod assist     General bed mobility comments: Assist to elevate trunk into sitting, bring legs off of bed,  and bring hips to EOB  Transfers Overall transfer level: Needs assistance Equipment used: Rolling walker (2 wheeled);Ambulation equipment used Transfers: Sit to/from Stand Sit to Stand: +2 physical assistance;Mod assist         General transfer comment: Assist to bring hips up. Pt stood with walker x 2 from bed. Stood with Stedy from [facility] and used Stedy to get from [facility] to recliner.  Ambulation/Gait Ambulation/Gait assistance: +2 physical assistance;Min assist Gait Distance (Feet): 2 Feet Assistive device: Rolling walker (2 wheeled) Gait Pattern/deviations: Step-to pattern;Decreased step length - right;Decreased step length - left;Trunk flexed Gait velocity: decr Gait velocity interpretation: <1.31 ft/sec, indicative of household ambulator General Gait Details: Assist for balance and support. Verbal cues to stand more erect   Stairs             Wheelchair  Mobility    Modified Rankin (Stroke Patients Only)       Balance Overall balance assessment: Needs assistance Sitting-balance support: Feet supported;Bilateral upper extremity supported Sitting balance-Leahy Scale: Poor Sitting balance - Comments: UE support sitting EOB   Standing balance support: Bilateral upper extremity supported;During functional activity Standing balance-Leahy Scale: Poor Standing balance comment: UE support on Stedy or walker and +2 min assist                            Cognition Arousal/Alertness: Awake/alert Behavior During Therapy: WFL for tasks assessed/performed Overall Cognitive Status: Within Functional Limits for tasks assessed                                        Exercises      General Comments        Pertinent Vitals/Pain Pain Assessment: Faces Faces Pain Scale: Hurts even more Pain Location: L groin Pain Descriptors / Indicators: Grimacing;Guarding Pain Intervention(s): Limited activity within patient's tolerance;Monitored during session;Repositioned    Home Living                      Prior Function            PT Goals (current goals can now be found in the care plan section) Progress towards PT goals: Progressing toward goals    Frequency    Min 2X/week      PT Plan Current plan  remains appropriate    Co-evaluation              AM-PAC PT "6 Clicks" Daily Activity  Outcome Measure  Difficulty turning over in bed (including adjusting bedclothes, sheets and blankets)?: Unable Difficulty moving from lying on back to sitting on the side of the bed? : Unable Difficulty sitting down on and standing up from a chair with arms (e.g., wheelchair, bedside commode, etc,.)?: Unable Help needed moving to and from a bed to chair (including a wheelchair)?: Total Help needed walking in hospital room?: Total Help needed climbing 3-5 steps with a railing? : Total 6 Click Score: 6    End  of Session Equipment Utilized During Treatment: Gait belt Activity Tolerance: Patient tolerated treatment well Patient left: in chair;with call bell/phone within reach;with family/visitor present Nurse Communication: Mobility status;Need for lift equipment PT Visit Diagnosis: Unsteadiness on feet (R26.81);Other abnormalities of gait and mobility (R26.89);Pain Pain - Right/Left: Left Pain - part of body: Hip     Time: 1345-1430 PT Time Calculation (min) (ACUTE ONLY): 45 min  Charges:  $Therapeutic Activity: 38-52 mins                    G Codes:       [facility] PT [place] 11/12/2017, 3:11 PM

## 2017-11-12 NOTE — Progress Notes (Signed)
[facility] [place]  Subjective  - feels ok   Objective (!) 147/54 60 (!) 97.4 F (36.3 C) (Oral) (!) 29 95%  Intake/Output Summary (Last 24 hours) at 11/12/2017 1258 Last data filed at 11/12/2017 1139 Gross per 24 hour  Intake 330 ml  Output 1021 ml  Net -691 ml   Left groin wound with some granulation Left foot 2+ DP pulse other incisions clean  Assessment/Planning: Foley for retention last night Culture E coli and Klebsiella sensitive to zosyn Cont VAC change Monday Daughter and wife updated.  Ruta Hinds 11/12/2017 12:58 PM --  Laboratory Lab Results: Recent Labs    11/10/17 0336 11/12/17 0632  WBC 10.1 6.7  HGB 9.8* 11.0*  HCT 31.8* 34.5*  PLT 374 323   BMET Recent Labs    11/11/17 1316 11/12/17 0632  NA 131* 130*  K 4.2 4.4  CL 95* 97*  CO2 27 24  GLUCOSE 186* 167*  BUN 33* 35*  CREATININE 1.67* 1.59*  CALCIUM 8.3* 8.1*    COAG Lab Results  Component Value Date   INR 1.27 11/10/2017   INR 1.20 11/04/2017   INR 1.13 10/21/2017   No results found for: PTT

## 2017-11-12 NOTE — Care Management Important Message (Signed)
Important Message  Patient Details  Name: [name] MRN: [id number] Date of Birth: 10/24/1940   Medicare Important Message Given:  Yes    Barb Merino Tyra Michelle 11/12/2017, 12:44 PM

## 2017-11-12 NOTE — Progress Notes (Signed)
PROGRESS NOTE    [name]  LGX:[id number] DOB: 1940/08/04 DOA: 11/04/2017 PCP: Lavone Orn, MD    Brief Narrative:  [name] is an 77 y.o. male with PVD, HTN, CKD III, hyperlipidemia, DM2, hypothyroidism, carotid artery stenosis s/p CEA, and remote h/o aortofemoral bypass who had a recent L  popliteal-femoral bypass due to a nonhealing ulcer on his L great toe. His postop course has been complicated by L groin wound infection with pansensitive E. Coli and Klebsiella pneumoniae. He underwent debridement and placement of a wound vac  in the OR and has been started on IV antibiotics.  Medical service has been consulted for management of his DM and hypertension.    Assessment & Plan:   Active Problems:   Diabetes mellitus type 2 with complications (HCC)   S/P CABG (coronary artery bypass graft)   Peripheral vascular disease, unspecified (HCC)   Essential hypertension   Chronic diastolic heart failure (HCC)   Chronic kidney disease, stage III (moderate) (HCC)   Polyneuropathy associated with underlying disease ([place])   Surgical wound infection   Volume overload   Left groin wound infection: Currently on cefazolin and further management as per vascular surgery.  Wound vac to be continued till Monday.     Mild acute on chronic diastolic heart failure:  - getting IV lasix 40 mg daily , monitor renal parameters on lasix. Creatinine slightly up from yesterday at 1.5 - strict intake and output.  - diuresed about 1200 ml of urine yesterday.  - will need accurate in and out's.    Urinary retention: pt had bladder spasms earlier today and on bladder scan he had 1200 ml urine as per RN.  Recommend foley catheter for acute urinary retention.    Stage 3 CKD: - creatinine appears to be at baseline.  - outpatient follow up with a nephrologist.    Diabetes mellitus:  CBG (last 3)  Recent Labs    11/11/17 2048 11/12/17 0604 11/12/17 1134  GLUCAP 183* 166* 222*    cbgs well controlled. Resume SSI and home meds.     Hypertension  Well controlled. , currently on lopressor, ramipril and lasix.    Hyperlipidemia  On lipitor.   PAD: On aspirin 325 mg daily.    Polyneuropathy:  On gabapentin. Continue the same.     Hypothyroidism:  Resume synthroid.no change in meds.      DVT prophylaxis: heparin Code Status: full code.  Family Communication: discussed with wife at bedside.  Disposition Plan: per vascular surgery.   Procedures: L  popliteal-femoral bypass     Antimicrobials: cefazolin    Subjective: Bladder spasms. Pain in the lower abdomen   Objective: Vitals:   11/11/17 2051 11/12/17 0004 11/12/17 0403 11/12/17 0800  BP: (!) 107/45 (!) 122/45 (!) 122/50 (!) 147/54  Pulse: 65   60  Resp: (!) 25 (!) 23 (!) 22 (!) 29  Temp: 98.1 F (36.7 C) 98 F (36.7 C) 97.8 F (36.6 C) 98 F (36.7 C)  TempSrc: Oral Oral Oral Oral  SpO2: 94% 95% 95% 95%  Weight:      Height:        Intake/Output Summary (Last 24 hours) at 11/12/2017 1226 Last data filed at 11/12/2017 1139 Gross per 24 hour  Intake 330 ml  Output 1021 ml  Net -691 ml   Filed Weights   11/05/17 0504 11/05/17 1213  Weight: 123.4 kg (272 lb 0.8 oz) 123.4 kg (272 lb)    Examination:  General exam: Appears to be in mild to mod distress from bladder spasms.  Respiratory system: Clear to auscultation. Respiratory effort normal. No wheezing or rhonchi.  Cardiovascular system: S1 & S2 heard, RRR. No JVD, murmurs,  Gastrointestinal system: Abdomen is soft NT ND BS+ Central nervous system: Alert and oriented. No focal neurological deficits. Extremities: Symmetric 5 x 5 power. Skin: No rashes, lesions or ulcers Psychiatry:  ANXIOUS.     Data Reviewed: I have personally reviewed following labs and imaging studies  CBC: Recent Labs  Lab 11/09/17 0323 11/10/17 0336 11/12/17 0632  WBC 8.5 10.1 6.7  HGB 9.8* 9.8* 11.0*  HCT 31.3* 31.8* 34.5*  MCV 94.6  95.8 95.3  PLT 312 374 540   Basic Metabolic Panel: Recent Labs  Lab 11/06/17 0206 11/09/17 0323 11/10/17 0336 11/11/17 1316 11/12/17 0632  NA 131* 132* 133* 131* 130*  K 4.8 4.8 4.5 4.2 4.4  CL 101 101 102 95* 97*  CO2 21* 22 23 27 24   GLUCOSE 229* 148* 139* 186* 167*  BUN 34* 38* 36* 33* 35*  CREATININE 1.49* 1.61* 1.48* 1.67* 1.59*  CALCIUM 7.9* 8.2* 8.2* 8.3* 8.1*   GFR: Estimated Creatinine Clearance: 51.2 mL/min (A) (by C-G formula based on SCr of 1.59 mg/dL (H)). Liver Function Tests: Recent Labs  Lab 11/09/17 0323  ALBUMIN 2.0*   No results for input(s): LIPASE, AMYLASE in the last 168 hours. No results for input(s): AMMONIA in the last 168 hours. Coagulation Profile: Recent Labs  Lab 11/10/17 0336  INR 1.27   Cardiac Enzymes: No results for input(s): CKTOTAL, CKMB, CKMBINDEX, TROPONINI in the last 168 hours. BNP (last 3 results) No results for input(s): PROBNP in the last 8760 hours. HbA1C: No results for input(s): HGBA1C in the last 72 hours. CBG: Recent Labs  Lab 11/11/17 1210 11/11/17 1614 11/11/17 2048 11/12/17 0604 11/12/17 1134  GLUCAP 179* 128* 183* 166* 222*   Lipid Profile: No results for input(s): CHOL, HDL, LDLCALC, TRIG, CHOLHDL, LDLDIRECT in the last 72 hours. Thyroid Function Tests: No results for input(s): TSH, T4TOTAL, FREET4, T3FREE, THYROIDAB in the last 72 hours. Anemia Panel: No results for input(s): VITAMINB12, FOLATE, FERRITIN, TIBC, IRON, RETICCTPCT in the last 72 hours. Sepsis Labs: No results for input(s): PROCALCITON, LATICACIDVEN in the last 168 hours.  Recent Results (from the past 240 hour(s))  Surgical pcr screen     Status: None   Collection Time: 11/05/17  1:32 AM  Result Value Ref Range Status   MRSA, PCR NEGATIVE NEGATIVE Final   Staphylococcus aureus NEGATIVE NEGATIVE Final    Comment: (NOTE) The Xpert SA Assay (FDA approved for NASAL specimens in patients 1 years of age and older), is one component of a  comprehensive surveillance program. It is not intended to diagnose infection nor to guide or monitor treatment. Performed at [facility], [place] [address], [place], [place] [address]   Aerobic/Anaerobic Culture (surgical/deep wound)     Status: None   Collection Time: 11/05/17  1:13 PM  Result Value Ref Range Status   Specimen Description GROIN  Final   Special Requests NONE  Final   Gram Stain   Final    MODERATE WBC PRESENT,BOTH PMN AND MONONUCLEAR FEW GRAM VARIABLE ROD    Culture   Final    MODERATE KLEBSIELLA PNEUMONIAE MODERATE ESCHERICHIA COLI NO ANAEROBES ISOLATED Performed at [facility], [place] [address] [place], [place] [address]    Report Status 11/10/2017 FINAL  Final  Organism ID, Bacteria KLEBSIELLA PNEUMONIAE  Final   Organism ID, Bacteria ESCHERICHIA COLI  Final      Susceptibility   Escherichia coli - MIC*    AMPICILLIN 8 SENSITIVE Sensitive     CEFAZOLIN <=4 SENSITIVE Sensitive     CEFEPIME <=1 SENSITIVE Sensitive     CEFTAZIDIME <=1 SENSITIVE Sensitive     CEFTRIAXONE <=1 SENSITIVE Sensitive     CIPROFLOXACIN <=0.25 SENSITIVE Sensitive     GENTAMICIN <=1 SENSITIVE Sensitive     IMIPENEM <=0.25 SENSITIVE Sensitive     TRIMETH/SULFA <=20 SENSITIVE Sensitive     AMPICILLIN/SULBACTAM 4 SENSITIVE Sensitive     PIP/TAZO <=4 SENSITIVE Sensitive     Extended ESBL NEGATIVE Sensitive     * MODERATE ESCHERICHIA COLI   Klebsiella pneumoniae - MIC*    AMPICILLIN >=32 RESISTANT Resistant     CEFAZOLIN <=4 SENSITIVE Sensitive     CEFEPIME <=1 SENSITIVE Sensitive     CEFTAZIDIME <=1 SENSITIVE Sensitive     CEFTRIAXONE <=1 SENSITIVE Sensitive     CIPROFLOXACIN <=0.25 SENSITIVE Sensitive     GENTAMICIN <=1 SENSITIVE Sensitive     IMIPENEM <=0.25 SENSITIVE Sensitive     TRIMETH/SULFA <=20 SENSITIVE Sensitive     AMPICILLIN/SULBACTAM 4 SENSITIVE Sensitive     PIP/TAZO <=4 SENSITIVE Sensitive     Extended ESBL NEGATIVE Sensitive     * MODERATE KLEBSIELLA  PNEUMONIAE  Aerobic Culture (superficial specimen)     Status: None   Collection Time: 11/10/17  9:53 AM  Result Value Ref Range Status   Specimen Description WOUND LEFT GROIN  Final   Special Requests NONE  Final   Gram Stain   Final    NO WBC SEEN NO ORGANISMS SEEN Performed at [facility], [address] [place], [place] [address]    Culture   Final    RARE KLEBSIELLA PNEUMONIAE RARE ESCHERICHIA COLI    Report Status 11/12/2017 FINAL  Final   Organism ID, Bacteria KLEBSIELLA PNEUMONIAE  Final   Organism ID, Bacteria ESCHERICHIA COLI  Final      Susceptibility   Escherichia coli - MIC*    AMPICILLIN 8 SENSITIVE Sensitive     CEFAZOLIN <=4 SENSITIVE Sensitive     CEFEPIME <=1 SENSITIVE Sensitive     CEFTAZIDIME <=1 SENSITIVE Sensitive     CEFTRIAXONE <=1 SENSITIVE Sensitive     CIPROFLOXACIN <=0.25 SENSITIVE Sensitive     GENTAMICIN <=1 SENSITIVE Sensitive     IMIPENEM <=0.25 SENSITIVE Sensitive     TRIMETH/SULFA <=20 SENSITIVE Sensitive     AMPICILLIN/SULBACTAM <=2 SENSITIVE Sensitive     PIP/TAZO <=4 SENSITIVE Sensitive     Extended ESBL NEGATIVE Sensitive     * RARE ESCHERICHIA COLI   Klebsiella pneumoniae - MIC*    AMPICILLIN >=32 RESISTANT Resistant     CEFAZOLIN <=4 SENSITIVE Sensitive     CEFEPIME <=1 SENSITIVE Sensitive     CEFTAZIDIME <=1 SENSITIVE Sensitive     CEFTRIAXONE <=1 SENSITIVE Sensitive     CIPROFLOXACIN <=0.25 SENSITIVE Sensitive     GENTAMICIN <=1 SENSITIVE Sensitive     IMIPENEM <=0.25 SENSITIVE Sensitive     TRIMETH/SULFA <=20 SENSITIVE Sensitive     AMPICILLIN/SULBACTAM 4 SENSITIVE Sensitive     PIP/TAZO <=4 SENSITIVE Sensitive     Extended ESBL NEGATIVE Sensitive     * RARE KLEBSIELLA PNEUMONIAE         Radiology Studies: No results found.      Scheduled Meds: . aspirin  EC  325 mg Oral q1800  . atorvastatin  20 mg Oral QPM  . feeding supplement (PRO-STAT SUGAR FREE 64)  30 mL Oral BID  . fenofibrate  54 mg Oral Daily    . furosemide  40 mg Oral Daily  . gabapentin  300 mg Oral QHS  . gabapentin  600 mg Oral Daily  . heparin  5,000 Units Subcutaneous Q8H  . insulin aspart  0-15 Units Subcutaneous TID WC  . levothyroxine  50 mcg Oral QAC breakfast  . loratadine  10 mg Oral Daily  . metoprolol tartrate  50 mg Oral BID  . multivitamin with minerals  1 tablet Oral Daily  . pantoprazole  40 mg Oral Daily  . pioglitazone  30 mg Oral Q lunch  . ramipril  10 mg Oral Daily  . senna-docusate  1 tablet Oral BID   Continuous Infusions: . sodium chloride 1,000 mL (11/12/17 0806)  .  ceFAZolin (ANCEF) IV 1 g (11/11/17 2151)     LOS: 8 days    Time spent: 30 MINUTES.     Hosie Poisson, MD [facility] Pager [phone]  If 7PM-7AM, please contact night-coverage [email or web address] Password [facility] 11/12/2017, 12:26 PM

## 2017-11-12 NOTE — Progress Notes (Signed)
Clinical Social Worker following pateint for support and discharge needs. Patient at this time is not medically stable to discharge to rehab. CSW will continue to follow.   Rhea Pink, MSW,  [place]

## 2017-11-13 LAB — BASIC METABOLIC PANEL
Anion gap: 7 (ref 5–15)
BUN: 34 mg/dL — ABNORMAL HIGH (ref 6–20)
CALCIUM: 8.2 mg/dL — AB (ref 8.9–10.3)
CO2: 26 mmol/L (ref 22–32)
Chloride: 98 mmol/L — ABNORMAL LOW (ref 101–111)
Creatinine, Ser: 1.55 mg/dL — ABNORMAL HIGH (ref 0.61–1.24)
GFR calc Af Amer: 48 mL/min — ABNORMAL LOW (ref >60)
GFR, EST NON AFRICAN AMERICAN: 41 mL/min — AB (ref >60)
GLUCOSE: 129 mg/dL — AB (ref 65–99)
Potassium: 3.7 mmol/L (ref 3.5–5.1)
SODIUM: 131 mmol/L — AB (ref 135–145)

## 2017-11-13 LAB — CBC
HCT: 30.3 % — ABNORMAL LOW (ref 39.0–52.0)
Hemoglobin: 9.5 g/dL — ABNORMAL LOW (ref 13.0–17.0)
MCH: 30.3 pg (ref 26.0–34.0)
MCHC: 31.4 g/dL (ref 30.0–36.0)
MCV: 96.5 fL (ref 78.0–100.0)
PLATELETS: 324 10*3/uL (ref 150–400)
RBC: 3.14 MIL/uL — ABNORMAL LOW (ref 4.22–5.81)
RDW: 18.1 % — AB (ref 11.5–15.5)
WBC: 5.8 10*3/uL (ref 4.0–10.5)

## 2017-11-13 LAB — GLUCOSE, CAPILLARY
GLUCOSE-CAPILLARY: 132 mg/dL — AB (ref 65–99)
GLUCOSE-CAPILLARY: 160 mg/dL — AB (ref 65–99)
Glucose-Capillary: 116 mg/dL — ABNORMAL HIGH (ref 65–99)
Glucose-Capillary: 160 mg/dL — ABNORMAL HIGH (ref 65–99)

## 2017-11-13 MED ORDER — FLEET ENEMA 7-19 GM/118ML RE ENEM
1.0000 | ENEMA | Freq: Every day | RECTAL | Status: DC | PRN
  Filled 2017-11-13: qty 1

## 2017-11-13 NOTE — Progress Notes (Signed)
PROGRESS NOTE    [name]  VZD:[id number] DOB: [birth year] DOA: 11/04/2017 PCP: Lavone Orn, MD    Brief Narrative:  [name] is an 77 y.o. male with PVD, HTN, CKD III, hyperlipidemia, DM2, hypothyroidism, carotid artery stenosis s/p CEA, and remote h/o aortofemoral bypass who had a recent L  popliteal-femoral bypass due to a nonhealing ulcer on his L great toe. His postop course has been complicated by L groin wound infection with pansensitive E. Coli and Klebsiella pneumoniae. He underwent debridement and placement of a wound vac  in the OR and has been started on IV antibiotics.  Medical service has been consulted for management of his DM and hypertension.    Assessment & Plan:   Active Problems:   Diabetes mellitus type 2 with complications (HCC)   S/P CABG (coronary artery bypass graft)   Peripheral vascular disease, unspecified (HCC)   Essential hypertension   Chronic diastolic heart failure (HCC)   Chronic kidney disease, stage III (moderate) (HCC)   Polyneuropathy associated with underlying disease ([place])   Surgical wound infection   Volume overload   Left groin wound infection: Currently on cefazolin and further management as per vascular surgery.  Wound vac to be continued till Monday.     Mild acute on chronic diastolic heart failure:  - getting  lasix 40 mg daily , monitor renal parameters on lasix. Creatinine stable at 1.5 - strict intake and output.  - will need accurate in and out's.    Urinary retention: acute and has foley today.    Stage 3 CKD: - creatinine appears to be at baseline.  - outpatient follow up with a nephrologist.    Diabetes mellitus:  CBG (last 3)  Recent Labs    11/13/17 0647 11/13/17 1146 11/13/17 1617  GLUCAP 132* 116* 160*   cbgs well controlled. Resume SSI and home meds.     Hypertension  Well controlled. , currently on lopressor, ramipril and lasix.    Hyperlipidemia  On lipitor.   PAD: On  aspirin 325 mg daily.    Polyneuropathy:  On gabapentin. Continue the same.     Hypothyroidism:  Resume synthroid.no change in meds.   Constipation:  Enema ordered and constipation resolved.    DVT prophylaxis: heparin Code Status: full code.  Family Communication: none at bedside.  Disposition Plan: per vascular surgery.   Procedures: L  popliteal-femoral bypass     Antimicrobials: cefazolin    Subjective: Constipated,    Objective: Vitals:   11/12/17 1241 11/12/17 1948 11/12/17 2337 11/13/17 0357  BP:  (!) 131/44 (!) 131/49 (!) 119/48  Pulse:  91  (!) 57  Resp:  (!) 24 (!) 25 19  Temp: (!) 97.4 F (36.3 C) 98.8 F (37.1 C) 98.8 F (37.1 C) 98.1 F (36.7 C)  TempSrc: Oral Oral Oral Oral  SpO2:  96% 96% 96%  Weight:      Height:        Intake/Output Summary (Last 24 hours) at 11/13/2017 1636 Last data filed at 11/13/2017 1200 Gross per 24 hour  Intake 397.7 ml  Output 600 ml  Net -202.3 ml   Filed Weights   11/05/17 0504 11/05/17 1213  Weight: 123.4 kg (272 lb 0.8 oz) 123.4 kg (272 lb)    Examination:  General exam: Appears to be in mild distress from constipation. Respiratory system: air entry fair, no wheezing or rhonchi.  Cardiovascular system: S1 & S2 heard, RRR. No JVD, murmurs,  Gastrointestinal system: Abdomen  is soft non tender , non distended bowel sounds heard.  Central nervous system: Alert and oriented. Non focal. Extremities: Symmetric 5 x 5 power. 2+ edema Skin: No rashes, lesions or ulcers Psychiatry:  In good spirits, calm .     Data Reviewed: I have personally reviewed following labs and imaging studies  CBC: Recent Labs  Lab 11/09/17 0323 11/10/17 0336 11/12/17 0632 11/13/17 0656  WBC 8.5 10.1 6.7 5.8  HGB 9.8* 9.8* 11.0* 9.5*  HCT 31.3* 31.8* 34.5* 30.3*  MCV 94.6 95.8 95.3 96.5  PLT 312 374 323 341   Basic Metabolic Panel: Recent Labs  Lab 11/09/17 0323 11/10/17 0336 11/11/17 1316 11/12/17 0632  11/13/17 0656  NA 132* 133* 131* 130* 131*  K 4.8 4.5 4.2 4.4 3.7  CL 101 102 95* 97* 98*  CO2 22 23 27 24 26   GLUCOSE 148* 139* 186* 167* 129*  BUN 38* 36* 33* 35* 34*  CREATININE 1.61* 1.48* 1.67* 1.59* 1.55*  CALCIUM 8.2* 8.2* 8.3* 8.1* 8.2*   GFR: Estimated Creatinine Clearance: 52.5 mL/min (A) (by C-G formula based on SCr of 1.55 mg/dL (H)). Liver Function Tests: Recent Labs  Lab 11/09/17 0323  ALBUMIN 2.0*   No results for input(s): LIPASE, AMYLASE in the last 168 hours. No results for input(s): AMMONIA in the last 168 hours. Coagulation Profile: Recent Labs  Lab 11/10/17 0336  INR 1.27   Cardiac Enzymes: No results for input(s): CKTOTAL, CKMB, CKMBINDEX, TROPONINI in the last 168 hours. BNP (last 3 results) No results for input(s): PROBNP in the last 8760 hours. HbA1C: No results for input(s): HGBA1C in the last 72 hours. CBG: Recent Labs  Lab 11/12/17 1615 11/12/17 2201 11/13/17 0647 11/13/17 1146 11/13/17 1617  GLUCAP 150* 146* 132* 116* 160*   Lipid Profile: No results for input(s): CHOL, HDL, LDLCALC, TRIG, CHOLHDL, LDLDIRECT in the last 72 hours. Thyroid Function Tests: No results for input(s): TSH, T4TOTAL, FREET4, T3FREE, THYROIDAB in the last 72 hours. Anemia Panel: No results for input(s): VITAMINB12, FOLATE, FERRITIN, TIBC, IRON, RETICCTPCT in the last 72 hours. Sepsis Labs: No results for input(s): PROCALCITON, LATICACIDVEN in the last 168 hours.  Recent Results (from the past 240 hour(s))  Surgical pcr screen     Status: None   Collection Time: 11/05/17  1:32 AM  Result Value Ref Range Status   MRSA, PCR NEGATIVE NEGATIVE Final   Staphylococcus aureus NEGATIVE NEGATIVE Final    Comment: (NOTE) The Xpert SA Assay (FDA approved for NASAL specimens in patients 2 years of age and older), is one component of a comprehensive surveillance program. It is not intended to diagnose infection nor to guide or monitor treatment. Performed at [facility], [place] [address] [place], [place] [address]   Aerobic/Anaerobic Culture (surgical/deep wound)     Status: None   Collection Time: 11/05/17  1:13 PM  Result Value Ref Range Status   Specimen Description GROIN  Final   Special Requests NONE  Final   Gram Stain   Final    MODERATE WBC PRESENT,BOTH PMN AND MONONUCLEAR FEW GRAM VARIABLE ROD    Culture   Final    MODERATE KLEBSIELLA PNEUMONIAE MODERATE ESCHERICHIA COLI NO ANAEROBES ISOLATED Performed at [facility], [place] [address] [place],  [address]    Report Status 11/10/2017 FINAL  Final   Organism ID, Bacteria KLEBSIELLA PNEUMONIAE  Final   Organism ID, Bacteria ESCHERICHIA COLI  Final      Susceptibility  Escherichia coli - MIC*    AMPICILLIN 8 SENSITIVE Sensitive     CEFAZOLIN <=4 SENSITIVE Sensitive     CEFEPIME <=1 SENSITIVE Sensitive     CEFTAZIDIME <=1 SENSITIVE Sensitive     CEFTRIAXONE <=1 SENSITIVE Sensitive     CIPROFLOXACIN <=0.25 SENSITIVE Sensitive     GENTAMICIN <=1 SENSITIVE Sensitive     IMIPENEM <=0.25 SENSITIVE Sensitive     TRIMETH/SULFA <=20 SENSITIVE Sensitive     AMPICILLIN/SULBACTAM 4 SENSITIVE Sensitive     PIP/TAZO <=4 SENSITIVE Sensitive     Extended ESBL NEGATIVE Sensitive     * MODERATE ESCHERICHIA COLI   Klebsiella pneumoniae - MIC*    AMPICILLIN >=32 RESISTANT Resistant     CEFAZOLIN <=4 SENSITIVE Sensitive     CEFEPIME <=1 SENSITIVE Sensitive     CEFTAZIDIME <=1 SENSITIVE Sensitive     CEFTRIAXONE <=1 SENSITIVE Sensitive     CIPROFLOXACIN <=0.25 SENSITIVE Sensitive     GENTAMICIN <=1 SENSITIVE Sensitive     IMIPENEM <=0.25 SENSITIVE Sensitive     TRIMETH/SULFA <=20 SENSITIVE Sensitive     AMPICILLIN/SULBACTAM 4 SENSITIVE Sensitive     PIP/TAZO <=4 SENSITIVE Sensitive     Extended ESBL NEGATIVE Sensitive     * MODERATE KLEBSIELLA PNEUMONIAE  Aerobic Culture (superficial specimen)     Status: None   Collection Time: 11/10/17  9:53 AM  Result Value Ref Range  Status   Specimen Description WOUND LEFT GROIN  Final   Special Requests NONE  Final   Gram Stain   Final    NO WBC SEEN NO ORGANISMS SEEN Performed at [facility], [address] [place], [place] [address]    Culture   Final    RARE KLEBSIELLA PNEUMONIAE RARE ESCHERICHIA COLI    Report Status 11/12/2017 FINAL  Final   Organism ID, Bacteria KLEBSIELLA PNEUMONIAE  Final   Organism ID, Bacteria ESCHERICHIA COLI  Final      Susceptibility   Escherichia coli - MIC*    AMPICILLIN 8 SENSITIVE Sensitive     CEFAZOLIN <=4 SENSITIVE Sensitive     CEFEPIME <=1 SENSITIVE Sensitive     CEFTAZIDIME <=1 SENSITIVE Sensitive     CEFTRIAXONE <=1 SENSITIVE Sensitive     CIPROFLOXACIN <=0.25 SENSITIVE Sensitive     GENTAMICIN <=1 SENSITIVE Sensitive     IMIPENEM <=0.25 SENSITIVE Sensitive     TRIMETH/SULFA <=20 SENSITIVE Sensitive     AMPICILLIN/SULBACTAM <=2 SENSITIVE Sensitive     PIP/TAZO <=4 SENSITIVE Sensitive     Extended ESBL NEGATIVE Sensitive     * RARE ESCHERICHIA COLI   Klebsiella pneumoniae - MIC*    AMPICILLIN >=32 RESISTANT Resistant     CEFAZOLIN <=4 SENSITIVE Sensitive     CEFEPIME <=1 SENSITIVE Sensitive     CEFTAZIDIME <=1 SENSITIVE Sensitive     CEFTRIAXONE <=1 SENSITIVE Sensitive     CIPROFLOXACIN <=0.25 SENSITIVE Sensitive     GENTAMICIN <=1 SENSITIVE Sensitive     IMIPENEM <=0.25 SENSITIVE Sensitive     TRIMETH/SULFA <=20 SENSITIVE Sensitive     AMPICILLIN/SULBACTAM 4 SENSITIVE Sensitive     PIP/TAZO <=4 SENSITIVE Sensitive     Extended ESBL NEGATIVE Sensitive     * RARE KLEBSIELLA PNEUMONIAE         Radiology Studies: No results found.      Scheduled Meds: . aspirin EC  325 mg Oral q1800  . atorvastatin  20 mg Oral QPM  . feeding supplement (PRO-STAT SUGAR FREE 64)  30  mL Oral BID  . fenofibrate  54 mg Oral Daily  . furosemide  40 mg Oral Daily  . gabapentin  300 mg Oral QHS  . gabapentin  600 mg Oral Daily  . heparin  5,000 Units  Subcutaneous Q8H  . insulin aspart  0-15 Units Subcutaneous TID WC  . levothyroxine  50 mcg Oral QAC breakfast  . loratadine  10 mg Oral Daily  . metoprolol tartrate  50 mg Oral BID  . multivitamin with minerals  1 tablet Oral Daily  . pantoprazole  40 mg Oral Daily  . pioglitazone  30 mg Oral Q lunch  . ramipril  10 mg Oral Daily  . senna-docusate  1 tablet Oral BID   Continuous Infusions: . sodium chloride 1,000 mL (11/12/17 0806)  .  ceFAZolin (ANCEF) IV 1 g (11/13/17 1500)     LOS: 9 days    Time spent: 30 MINUTES.     Hosie Poisson, MD [facility] Pager [phone]  If 7PM-7AM, please contact night-coverage [email or web address] Password [facility] 11/13/2017, 4:36 PM

## 2017-11-13 NOTE — Progress Notes (Addendum)
  Progress Note    11/13/2017 9:23 AM 3 Days Post-Op  Subjective:  Sitting up eating-no complaints  Afebrile   Vitals:   11/12/17 2337 11/13/17 0357  BP: (!) 131/49 (!) 119/48  Pulse:  (!) 57  Resp: (!) 25 19  Temp: 98.8 F (37.1 C) 98.1 F (36.7 C)  SpO2: 96% 96%    Physical Exam: Cardiac:  regular Lungs:  Non labored Incisions:  Left groin with vac in place with good seal Extremities:  Edema has improved in left leg; easily palpable left DP pulse.   CBC    Component Value Date/Time   WBC 5.8 11/13/2017 0656   RBC 3.14 (L) 11/13/2017 0656   HGB 9.5 (L) 11/13/2017 0656   HCT 30.3 (L) 11/13/2017 0656   PLT 324 11/13/2017 0656   MCV 96.5 11/13/2017 0656   MCH 30.3 11/13/2017 0656   MCHC 31.4 11/13/2017 0656   RDW 18.1 (H) 11/13/2017 0656   LYMPHSABS 0.7 10/28/2017 2219   MONOABS 0.8 10/28/2017 2219   EOSABS 0.1 10/28/2017 2219   BASOSABS 0.0 10/28/2017 2219    BMET    Component Value Date/Time   NA 131 (L) 11/13/2017 0656   K 3.7 11/13/2017 0656   CL 98 (L) 11/13/2017 0656   CO2 26 11/13/2017 0656   GLUCOSE 129 (H) 11/13/2017 0656   BUN 34 (H) 11/13/2017 0656   CREATININE 1.55 (H) 11/13/2017 0656   CREATININE 1.32 04/18/2012 0949   CALCIUM 8.2 (L) 11/13/2017 0656   GFRNONAA 41 (L) 11/13/2017 0656   GFRAA 48 (L) 11/13/2017 0656    INR    Component Value Date/Time   INR 1.27 11/10/2017 0336     Intake/Output Summary (Last 24 hours) at 11/13/2017 0923 Last data filed at 11/13/2017 0353 Gross per 24 hour  Intake 157.7 ml  Output 800 ml  Net -642.3 ml     Assessment:  77 y.o. male is s/p:  Groin debridement with wound vac  3 Days Post-Op  Plan: -wound vac change on Monday-continue abx -continue to encourage nutrition -mobilize -DVT prophylaxis:  Sq heparin   Leontine Locket, PA-C [facility] [phone] 11/13/2017 9:23 AM  I have examined the patient, reviewed and agree with above.  Had some air leak from left  groin VAC.  I replaced a sealant over this and this did she adequate seal.  Discussed at length with the patient and his family present including his daughter.  Explained that if the [facility] continues to have difficulty could switch to wet-to-dry normal saline dressings over the weekend and have replacement by the wound nurses on Monday  Curt Jews, MD 11/13/2017 3:30 PM

## 2017-11-14 LAB — GLUCOSE, CAPILLARY
GLUCOSE-CAPILLARY: 139 mg/dL — AB (ref 65–99)
Glucose-Capillary: 155 mg/dL — ABNORMAL HIGH (ref 65–99)
Glucose-Capillary: 151 mg/dL — ABNORMAL HIGH (ref 65–99)
Glucose-Capillary: 176 mg/dL — ABNORMAL HIGH (ref 65–99)

## 2017-11-14 LAB — BASIC METABOLIC PANEL
ANION GAP: 7 (ref 5–15)
BUN: 56 mg/dL — AB (ref 6–20)
CALCIUM: 8.6 mg/dL — AB (ref 8.9–10.3)
CO2: 23 mmol/L (ref 22–32)
CREATININE: 1.33 mg/dL — AB (ref 0.61–1.24)
Chloride: 106 mmol/L (ref 101–111)
GFR calc Af Amer: 57 mL/min — ABNORMAL LOW (ref >60)
GFR calc non Af Amer: 50 mL/min — ABNORMAL LOW (ref >60)
GLUCOSE: 155 mg/dL — AB (ref 65–99)
Potassium: 4.5 mmol/L (ref 3.5–5.1)
Sodium: 136 mmol/L (ref 135–145)

## 2017-11-14 LAB — CBC
HEMATOCRIT: 36.4 % — AB (ref 39.0–52.0)
Hemoglobin: 11.6 g/dL — ABNORMAL LOW (ref 13.0–17.0)
MCH: 32.5 pg (ref 26.0–34.0)
MCHC: 31.9 g/dL (ref 30.0–36.0)
MCV: 102 fL — AB (ref 78.0–100.0)
Platelets: 228 10*3/uL (ref 150–400)
RBC: 3.57 MIL/uL — ABNORMAL LOW (ref 4.22–5.81)
RDW: 14.1 % (ref 11.5–15.5)
WBC: 13.5 10*3/uL — ABNORMAL HIGH (ref 4.0–10.5)

## 2017-11-14 NOTE — Progress Notes (Signed)
PROGRESS NOTE    [name]  TKP:[id number] DOB: [birth year] DOA: 11/04/2017 PCP: Lavone Orn, MD    Brief Narrative:  [name] is an 77 y.o. male with PVD, HTN, CKD III, hyperlipidemia, DM2, hypothyroidism, carotid artery stenosis s/p CEA, and remote h/o aortofemoral bypass who had a recent L  popliteal-femoral bypass due to a nonhealing ulcer on his L great toe. His postop course has been complicated by L groin wound infection with pansensitive E. Coli and Klebsiella pneumoniae. He underwent debridement and placement of a wound vac  in the OR and has been started on IV antibiotics.  Medical service has been consulted for management of his DM and hypertension.    Assessment & Plan:   Active Problems:   Diabetes mellitus type 2 with complications (HCC)   S/P CABG (coronary artery bypass graft)   Peripheral vascular disease, unspecified (HCC)   Essential hypertension   Chronic diastolic heart failure (HCC)   Chronic kidney disease, stage III (moderate) (HCC)   Polyneuropathy associated with underlying disease ([place])   Surgical wound infection   Volume overload   Left groin wound infection: Currently on cefazolin and further management as per vascular surgery.  Wound vac to be continued till Monday.  CULTURES FROM the groin from 6/19 growing klebsiella and E coli sensitive to cephalosporins.     Mild acute on chronic diastolic heart failure:  - getting  lasix 40 mg daily , monitor renal parameters on lasix. Creatinine stable at 1.3 - strict intake and output.  - daily weights.  - BUN increased to 56.    Urinary retention: acute and has foley catheter.    Stage 3 CKD: - creatinine better  - outpatient follow up with a nephrologist.    Diabetes mellitus:  CBG (last 3)  Recent Labs    11/13/17 1617 11/13/17 2129 11/14/17 0618  GLUCAP 160* 160* 155*   cbgs well controlled. Resume SSI and home meds.     Hypertension  Well controlled. , currently  on lopressor, ramipril and lasix.    Hyperlipidemia  On lipitor.   PAD: On aspirin 325 mg daily.    Polyneuropathy:  On gabapentin. Continue the same.     Hypothyroidism:  Resume synthroid.no change in meds.   Constipation:  Enema ordered and constipation resolved.    DVT prophylaxis: heparin Code Status: full code.  Family Communication: none at bedside.  Disposition Plan: per vascular surgery.   Procedures: L  popliteal-femoral bypass     Antimicrobials: cefazolin    Subjective: No chest pain, or sob.    Objective: Vitals:   11/13/17 1957 11/13/17 2323 11/14/17 0400 11/14/17 0403  BP: (!) 112/53 (!) 134/50 (!) 135/44   Pulse: 67 70 60   Resp: (!) 24 (!) 28 (!) 33   Temp: 98.1 F (36.7 C) 99.7 F (37.6 C) 98.9 F (37.2 C)   TempSrc: Oral Oral Oral   SpO2: 96% 98% 96% 98%  Weight:      Height:        Intake/Output Summary (Last 24 hours) at 11/14/2017 1158 Last data filed at 11/14/2017 0402 Gross per 24 hour  Intake 705.63 ml  Output 1970 ml  Net -1264.37 ml   Filed Weights   11/05/17 0504 11/05/17 1213  Weight: 123.4 kg (272 lb 0.8 oz) 123.4 kg (272 lb)    Examination:  General exam: Appears  Calm and comfortable.  Respiratory system: air entry fair,  No wheezing or rhonchi.  Cardiovascular system:  S1 & S2 heard, RRR. No JVD, murmurs,  Gastrointestinal system: Abdomen is soft NT ND BS+ Central nervous system: Alert and oriented. Non focal. Extremities: Symmetric 5 x 5 power. 2+ edema, GROIN wound connected to wound vac.  Skin: No rashes, lesions or ulcers Psychiatry: calm.     Data Reviewed: I have personally reviewed following labs and imaging studies  CBC: Recent Labs  Lab 11/09/17 0323 11/10/17 0336 11/12/17 0632 11/13/17 0656 11/14/17 0902  WBC 8.5 10.1 6.7 5.8 13.5*  HGB 9.8* 9.8* 11.0* 9.5* 11.6*  HCT 31.3* 31.8* 34.5* 30.3* 36.4*  MCV 94.6 95.8 95.3 96.5 102.0*  PLT 312 374 323 324 563   Basic Metabolic  Panel: Recent Labs  Lab 11/10/17 0336 11/11/17 1316 11/12/17 0632 11/13/17 0656 11/14/17 0902  NA 133* 131* 130* 131* 136  K 4.5 4.2 4.4 3.7 4.5  CL 102 95* 97* 98* 106  CO2 23 27 24 26 23   GLUCOSE 139* 186* 167* 129* 155*  BUN 36* 33* 35* 34* 56*  CREATININE 1.48* 1.67* 1.59* 1.55* 1.33*  CALCIUM 8.2* 8.3* 8.1* 8.2* 8.6*   GFR: Estimated Creatinine Clearance: 61.2 mL/min (A) (by C-G formula based on SCr of 1.33 mg/dL (H)). Liver Function Tests: Recent Labs  Lab 11/09/17 0323  ALBUMIN 2.0*   No results for input(s): LIPASE, AMYLASE in the last 168 hours. No results for input(s): AMMONIA in the last 168 hours. Coagulation Profile: Recent Labs  Lab 11/10/17 0336  INR 1.27   Cardiac Enzymes: No results for input(s): CKTOTAL, CKMB, CKMBINDEX, TROPONINI in the last 168 hours. BNP (last 3 results) No results for input(s): PROBNP in the last 8760 hours. HbA1C: No results for input(s): HGBA1C in the last 72 hours. CBG: Recent Labs  Lab 11/13/17 0647 11/13/17 1146 11/13/17 1617 11/13/17 2129 11/14/17 0618  GLUCAP 132* 116* 160* 160* 155*   Lipid Profile: No results for input(s): CHOL, HDL, LDLCALC, TRIG, CHOLHDL, LDLDIRECT in the last 72 hours. Thyroid Function Tests: No results for input(s): TSH, T4TOTAL, FREET4, T3FREE, THYROIDAB in the last 72 hours. Anemia Panel: No results for input(s): VITAMINB12, FOLATE, FERRITIN, TIBC, IRON, RETICCTPCT in the last 72 hours. Sepsis Labs: No results for input(s): PROCALCITON, LATICACIDVEN in the last 168 hours.  Recent Results (from the past 240 hour(s))  Surgical pcr screen     Status: None   Collection Time: 11/05/17  1:32 AM  Result Value Ref Range Status   MRSA, PCR NEGATIVE NEGATIVE Final   Staphylococcus aureus NEGATIVE NEGATIVE Final    Comment: (NOTE) The Xpert SA Assay (FDA approved for NASAL specimens in patients 5 years of age and older), is one component of a comprehensive surveillance program. It is not  intended to diagnose infection nor to guide or monitor treatment. Performed at [facility], [place] [address] [place], [place] [address]   Aerobic/Anaerobic Culture (surgical/deep wound)     Status: None   Collection Time: 11/05/17  1:13 PM  Result Value Ref Range Status   Specimen Description GROIN  Final   Special Requests NONE  Final   Gram Stain   Final    MODERATE WBC PRESENT,BOTH PMN AND MONONUCLEAR FEW GRAM VARIABLE ROD    Culture   Final    MODERATE KLEBSIELLA PNEUMONIAE MODERATE ESCHERICHIA COLI NO ANAEROBES ISOLATED Performed at [facility], [place] [address] [place], [place] [address]    Report Status 11/10/2017 FINAL  Final   Organism ID, Bacteria KLEBSIELLA PNEUMONIAE  Final  Organism ID, Bacteria ESCHERICHIA COLI  Final      Susceptibility   Escherichia coli - MIC*    AMPICILLIN 8 SENSITIVE Sensitive     CEFAZOLIN <=4 SENSITIVE Sensitive     CEFEPIME <=1 SENSITIVE Sensitive     CEFTAZIDIME <=1 SENSITIVE Sensitive     CEFTRIAXONE <=1 SENSITIVE Sensitive     CIPROFLOXACIN <=0.25 SENSITIVE Sensitive     GENTAMICIN <=1 SENSITIVE Sensitive     IMIPENEM <=0.25 SENSITIVE Sensitive     TRIMETH/SULFA <=20 SENSITIVE Sensitive     AMPICILLIN/SULBACTAM 4 SENSITIVE Sensitive     PIP/TAZO <=4 SENSITIVE Sensitive     Extended ESBL NEGATIVE Sensitive     * MODERATE ESCHERICHIA COLI   Klebsiella pneumoniae - MIC*    AMPICILLIN >=32 RESISTANT Resistant     CEFAZOLIN <=4 SENSITIVE Sensitive     CEFEPIME <=1 SENSITIVE Sensitive     CEFTAZIDIME <=1 SENSITIVE Sensitive     CEFTRIAXONE <=1 SENSITIVE Sensitive     CIPROFLOXACIN <=0.25 SENSITIVE Sensitive     GENTAMICIN <=1 SENSITIVE Sensitive     IMIPENEM <=0.25 SENSITIVE Sensitive     TRIMETH/SULFA <=20 SENSITIVE Sensitive     AMPICILLIN/SULBACTAM 4 SENSITIVE Sensitive     PIP/TAZO <=4 SENSITIVE Sensitive     Extended ESBL NEGATIVE Sensitive     * MODERATE KLEBSIELLA PNEUMONIAE  Aerobic Culture (superficial  specimen)     Status: None   Collection Time: 11/10/17  9:53 AM  Result Value Ref Range Status   Specimen Description WOUND LEFT GROIN  Final   Special Requests NONE  Final   Gram Stain   Final    NO WBC SEEN NO ORGANISMS SEEN Performed at [facility], [address], [place], [place] [address]    Culture   Final    RARE KLEBSIELLA PNEUMONIAE RARE ESCHERICHIA COLI    Report Status 11/12/2017 FINAL  Final   Organism ID, Bacteria KLEBSIELLA PNEUMONIAE  Final   Organism ID, Bacteria ESCHERICHIA COLI  Final      Susceptibility   Escherichia coli - MIC*    AMPICILLIN 8 SENSITIVE Sensitive     CEFAZOLIN <=4 SENSITIVE Sensitive     CEFEPIME <=1 SENSITIVE Sensitive     CEFTAZIDIME <=1 SENSITIVE Sensitive     CEFTRIAXONE <=1 SENSITIVE Sensitive     CIPROFLOXACIN <=0.25 SENSITIVE Sensitive     GENTAMICIN <=1 SENSITIVE Sensitive     IMIPENEM <=0.25 SENSITIVE Sensitive     TRIMETH/SULFA <=20 SENSITIVE Sensitive     AMPICILLIN/SULBACTAM <=2 SENSITIVE Sensitive     PIP/TAZO <=4 SENSITIVE Sensitive     Extended ESBL NEGATIVE Sensitive     * RARE ESCHERICHIA COLI   Klebsiella pneumoniae - MIC*    AMPICILLIN >=32 RESISTANT Resistant     CEFAZOLIN <=4 SENSITIVE Sensitive     CEFEPIME <=1 SENSITIVE Sensitive     CEFTAZIDIME <=1 SENSITIVE Sensitive     CEFTRIAXONE <=1 SENSITIVE Sensitive     CIPROFLOXACIN <=0.25 SENSITIVE Sensitive     GENTAMICIN <=1 SENSITIVE Sensitive     IMIPENEM <=0.25 SENSITIVE Sensitive     TRIMETH/SULFA <=20 SENSITIVE Sensitive     AMPICILLIN/SULBACTAM 4 SENSITIVE Sensitive     PIP/TAZO <=4 SENSITIVE Sensitive     Extended ESBL NEGATIVE Sensitive     * RARE KLEBSIELLA PNEUMONIAE         Radiology Studies: No results found.      Scheduled Meds: . aspirin EC  325 mg Oral q1800  . atorvastatin  20 mg Oral QPM  . feeding supplement (PRO-STAT SUGAR FREE 64)  30 mL Oral BID  . fenofibrate  54 mg Oral Daily  . furosemide  40 mg Oral Daily  .  gabapentin  300 mg Oral QHS  . gabapentin  600 mg Oral Daily  . heparin  5,000 Units Subcutaneous Q8H  . insulin aspart  0-15 Units Subcutaneous TID WC  . levothyroxine  50 mcg Oral QAC breakfast  . loratadine  10 mg Oral Daily  . metoprolol tartrate  50 mg Oral BID  . multivitamin with minerals  1 tablet Oral Daily  . pantoprazole  40 mg Oral Daily  . pioglitazone  30 mg Oral Q lunch  . ramipril  10 mg Oral Daily  . senna-docusate  1 tablet Oral BID   Continuous Infusions: . sodium chloride 1,000 mL (11/12/17 0806)  .  ceFAZolin (ANCEF) IV 1 g (11/14/17 0653)     LOS: 10 days    Time spent: 30 MINUTES.     Hosie Poisson, MD [facility] Pager [phone]  If 7PM-7AM, please contact night-coverage [email or web address] Password [facility] 11/14/2017, 11:58 AM

## 2017-11-14 NOTE — Progress Notes (Signed)
Patient ID: [name], male   DOB: [birth year], 77 y.o.   MRN: [id number] Afebrile vital signs stable. Sleep morning did not awaken Continue VACdressing and antibiotics

## 2017-11-14 NOTE — Progress Notes (Signed)
Daily Progress Note   Patient Name: [name]       Date: 11/14/2017 DOB: 1941-05-15  Age: 77 y.o. MRN#: [id number] Attending Physician: Elam Dutch, MD Primary Care Physician: Lavone Orn, MD Admit Date: 11/04/2017  Reason for Consultation/Follow-up: Establishing goals of care  Subjective: Patient is resting in bed. He states he is waiting for his wife to come this morning as he is bored and her visits make the day better. He states he is ready to go home. He states his family has canceled their annual beach trip, though he wishes they would go anyway, and not worry about him. He discusses the collage of beach pictures sitting in his window.    Inquired as to if he had questions regarding the MOST form provided to him last visit. He states he has not looked at the form and sent it home with his wife [name]. He states he believes his POA paperwork is sufficient.   He states his pain is controlled. No other complaints.     Length of Stay: 10  Current Medications: Scheduled Meds:  . aspirin EC  325 mg Oral q1800  . atorvastatin  20 mg Oral QPM  . feeding supplement (PRO-STAT SUGAR FREE 64)  30 mL Oral BID  . fenofibrate  54 mg Oral Daily  . furosemide  40 mg Oral Daily  . gabapentin  300 mg Oral QHS  . gabapentin  600 mg Oral Daily  . heparin  5,000 Units Subcutaneous Q8H  . insulin aspart  0-15 Units Subcutaneous TID WC  . levothyroxine  50 mcg Oral QAC breakfast  . loratadine  10 mg Oral Daily  . metoprolol tartrate  50 mg Oral BID  . multivitamin with minerals  1 tablet Oral Daily  . pantoprazole  40 mg Oral Daily  . pioglitazone  30 mg Oral Q lunch  . ramipril  10 mg Oral Daily  . senna-docusate  1 tablet Oral BID    Continuous Infusions: . sodium chloride  1,000 mL (11/12/17 0806)  .  ceFAZolin (ANCEF) IV 1 g (11/14/17 0653)    PRN Meds: sodium chloride, acetaminophen **OR** acetaminophen, alum & mag hydroxide-simeth, dextromethorphan-guaiFENesin, docusate sodium, hydrALAZINE, HYDROcodone-acetaminophen, HYDROmorphone (DILAUDID) injection, labetalol, metoprolol tartrate, nitroGLYCERIN, ondansetron, oxymetazoline, phenol, polyethylene glycol, polyvinyl alcohol, sodium phosphate, temazepam  Physical  Exam  Constitutional: No distress.  Pulmonary/Chest: Effort normal.  Neurological: He is alert.            Vital Signs: BP (!) 135/44 (BP Location: Left Arm)   Pulse 60   Temp 98.9 F (37.2 C) (Oral)   Resp (!) 33   Ht 5\' 11"  (1.803 m)   Wt 123.4 kg (272 lb)   SpO2 98%   BMI 37.94 kg/m  SpO2: SpO2: 98 % O2 Device: O2 Device: Room Air O2 Flow Rate: O2 Flow Rate (L/min): 2 L/min  Intake/output summary:   Intake/Output Summary (Last 24 hours) at 11/14/2017 0957 Last data filed at 11/14/2017 0402 Gross per 24 hour  Intake 705.63 ml  Output 1970 ml  Net -1264.37 ml   LBM: Last BM Date: 11/12/17 Baseline Weight: Weight: 123.4 kg (272 lb 0.8 oz) Most recent weight: Weight: 123.4 kg (272 lb)       Palliative Assessment/Data: 50%      Patient Active Problem List   Diagnosis Date Noted  . Volume overload 11/10/2017  . Surgical wound infection 11/04/2017  . PAD (peripheral artery disease) ([place]) 10/25/2017  . Polyneuropathy associated with underlying disease ([place]) 08/16/2017  . Essential hypertension 02/06/2014  . Chronic diastolic heart failure ([place]) 02/06/2014  . Chronic kidney disease, stage III (moderate) ([place]) 02/06/2014  . Aftercare following surgery of the circulatory system, [place] 01/25/2014  . Carotid stenosis 06/08/2013  . Stricture of artery ([place]) 04/14/2012  . Peripheral vascular disease, unspecified ([place]) 10/08/2011  . DVT of upper extremity (deep vein thrombosis), left 09/29/2011  . S/P CABG (coronary artery bypass  graft) 09/29/2011  . H/O carotid endarterectomy 09/29/2011  . Occlusion and stenosis of carotid artery without mention of cerebral infarction 06/11/2011  . Diabetes mellitus type 2 with complications ([place]) 35/32/9924    Palliative Care Assessment & Plan   Patient Profile: Patient is a 77 year old male who returns for postoperative follow-up today. He underwent left common femoral endarterectomy and left femoral to below-knee popliteal bypass with vein 10 days ago. [facility].   Assessment/Recommendations/Plan:  Continue full scope of treatment.   Please call PMT if further needs arise.     Code Status:    Code Status Orders  (From admission, onward)        Start     Ordered   11/04/17 1754  Full code  Continuous     11/04/17 1754    Code Status History    Date Active Date Inactive Code Status Order ID Comments User Context   10/25/2017 1600 11/02/2017 1432 Full Code [id number]  Iline Oven Inpatient   10/11/2017 1646 10/11/2017 2348 Full Code [id number]  Waynetta Sandy, MD Inpatient   09/21/2011 0817 09/29/2011 1628 Full Code [id number]  [name], MD Inpatient   09/14/2011 1555 09/21/2011 0817 Full Code [id number]  Lemont Fillers, RN Inpatient   04/08/2011 1702 04/09/2011 1416 Full Code [id number]  Love, Kaylyn Layer, RN Inpatient    Advance Directive Documentation     Most Recent Value  Type of Advance Directive  Healthcare Power of Attorney  Pre-existing out of facility DNR order (yellow form or pink MOST form)  -  "MOST" Form in Place?  -       Prognosis:   Unable to determine  Discharge Planning:  To Be Determined   Thank you for allowing the Palliative Medicine Team to assist in the care of this patient.   Total Time  25 min Prolonged Time Billed  No      Greater than 50%  of this time was spent counseling and coordinating care related to the above assessment and plan.  Asencion Gowda, NP  Please contact Palliative Medicine Team phone at [phone] for questions and concerns.

## 2017-11-15 ENCOUNTER — Encounter (HOSPITAL_COMMUNITY): Payer: Self-pay | Admitting: Urology

## 2017-11-15 LAB — URINALYSIS, ROUTINE W REFLEX MICROSCOPIC
Bilirubin Urine: NEGATIVE
Glucose, UA: NEGATIVE mg/dL
KETONES UR: NEGATIVE mg/dL
Nitrite: NEGATIVE
PH: 6 (ref 5.0–8.0)
Protein, ur: NEGATIVE mg/dL
SPECIFIC GRAVITY, URINE: 1.008 (ref 1.005–1.030)

## 2017-11-15 LAB — BASIC METABOLIC PANEL
Anion gap: 8 (ref 5–15)
BUN: 31 mg/dL — AB (ref 6–20)
CALCIUM: 8.3 mg/dL — AB (ref 8.9–10.3)
CO2: 28 mmol/L (ref 22–32)
Chloride: 100 mmol/L — ABNORMAL LOW (ref 101–111)
Creatinine, Ser: 1.26 mg/dL — ABNORMAL HIGH (ref 0.61–1.24)
GFR calc Af Amer: >60 mL/min (ref >60)
GFR, EST NON AFRICAN AMERICAN: 53 mL/min — AB (ref >60)
Glucose, Bld: 150 mg/dL — ABNORMAL HIGH (ref 65–99)
Potassium: 3.4 mmol/L — ABNORMAL LOW (ref 3.5–5.1)
SODIUM: 136 mmol/L (ref 135–145)

## 2017-11-15 LAB — CBC
HCT: 31.4 % — ABNORMAL LOW (ref 39.0–52.0)
Hemoglobin: 9.9 g/dL — ABNORMAL LOW (ref 13.0–17.0)
MCH: 30.6 pg (ref 26.0–34.0)
MCHC: 31.5 g/dL (ref 30.0–36.0)
MCV: 96.9 fL (ref 78.0–100.0)
PLATELETS: 289 10*3/uL (ref 150–400)
RBC: 3.24 MIL/uL — AB (ref 4.22–5.81)
RDW: 18.4 % — AB (ref 11.5–15.5)
WBC: 6.1 10*3/uL (ref 4.0–10.5)

## 2017-11-15 LAB — GLUCOSE, CAPILLARY
GLUCOSE-CAPILLARY: 155 mg/dL — AB (ref 65–99)
GLUCOSE-CAPILLARY: 173 mg/dL — AB (ref 65–99)
GLUCOSE-CAPILLARY: 134 mg/dL — AB (ref 65–99)
GLUCOSE-CAPILLARY: 174 mg/dL — AB (ref 65–99)

## 2017-11-15 MED ORDER — TAMSULOSIN HCL 0.4 MG PO CAPS
0.4000 mg | ORAL_CAPSULE | Freq: Every day | ORAL | Status: DC
  Administered 2017-11-16 – 2017-12-03 (×18): 0.4 mg via ORAL
  Filled 2017-11-15 (×18): qty 1

## 2017-11-15 MED ORDER — CIPROFLOXACIN HCL 500 MG PO TABS
500.0000 mg | ORAL_TABLET | Freq: Two times a day (BID) | ORAL | Status: DC
  Administered 2017-11-15 – 2017-11-17 (×4): 500 mg via ORAL
  Filled 2017-11-15 (×4): qty 1

## 2017-11-15 MED ORDER — POTASSIUM CHLORIDE CRYS ER 20 MEQ PO TBCR
40.0000 meq | EXTENDED_RELEASE_TABLET | Freq: Once | ORAL | Status: AC
  Administered 2017-11-15: 40 meq via ORAL
  Filled 2017-11-15: qty 2

## 2017-11-15 MED ORDER — SENNOSIDES-DOCUSATE SODIUM 8.6-50 MG PO TABS: 1.0000 | ORAL_TABLET | Freq: Every evening | ORAL | Status: DC | PRN

## 2017-11-15 NOTE — Consult Note (Signed)
Subjective: CC: Urinary retention.  Hx:  [name] is a 77 yo WM who I was asked to see in consultation by Dr. Karleen Hampshire for urinary retention.  He has PVD, HTN, CKD III, hyperlipidemia, DM2, hypothyroidism, carotid artery stenosis s/p CEA,and remote h/o aortofemoral bypasswho had arecentLpopliteal-femoral bypass due to a nonhealing ulcer on his L great toe. His postop course has been complicated by L groin wound infection with pansensitiveE. Coli and Klebsiella pneumoniae.   He was having bladder spasms on 6/21 and a PVR was noted to be 1218ml.  A foley was easily placed.   He is unable to provide a clear history of his condition.  He is tolerating the foley well.   He had been seen by Dr. Diona Fanti in 2012 for urinary urgency.      ROS:  Review of Systems  Unable to perform ROS: Mental acuity    Allergies  Allergen Reactions  . Amoxicillin-Pot Clavulanate Nausea Only    Has patient had a PCN reaction causing immediate rash, facial/tongue/throat swelling, SOB or lightheadedness with hypotension: No Has patient had a PCN reaction causing severe rash involving mucus membranes or skin necrosis: No Has patient had a PCN reaction that required hospitalization: No Has patient had a PCN reaction occurring within the last 10 years: No If all of the above answers are "NO", then may proceed with Cephalosporin use.   Marland Kitchen Morphine And Related Other (See Comments)    hallucinates    Past Medical History:  Diagnosis Date  . Arthritis   . Atherosclerotic heart disease   . Back pain   . Bruises easily    takes Pletal daily and ASA 325mg  daily  . Cataract immature    right  . Chest pain   . Colon polyps   . Complication of anesthesia    hard to wake up with bypass surgery  . Cyst    on perineum;takes Doxycycline daily  . Diabetes mellitus    takes Glipizide,Metformin,and Actos daily  . Diarrhea   . Dyspnea    when lying flat  . Edema    right leg knee down swollen since fall 3 wks  ago  . Foot drop, left   . GERD (gastroesophageal reflux disease)    Rolaids as needed-occ reflux  . Hemorrhoids   . History of kidney stones    at age 44   . Hyperlipidemia    takes Tricor and Lipitor daily  . Hypertension    takes Altace,Amlodipine,and Nadolol daily  . Hypothyroidism   . Kidney stone    35+yrs ago  . Muscle pain   . Nasal polyps    hx of  . Peripheral edema    wears knee length hose-told by podiatrist to wear these  . Peripheral neuropathy    takes Gabapentin daily  . Pneumonia    as a child  . PVD (peripheral vascular disease) ([place])   . Renal insufficiency   . Seasonal allergies    takes Mucinex and Zyrtec prn  . Urinary frequency    urgency/takes Vesicare daily    Past Surgical History:  Procedure Laterality Date  . Aortobifemoral bypass    . APPLICATION OF WOUND VAC Left 10/25/2017   Procedure: Wound vac placement to left groin.;  Surgeon: Elam Dutch, MD;  Location: [facility] OR;  Service: Vascular;  Laterality: Left;  . APPLICATION OF WOUND VAC Left 11/10/2017   Procedure: APPLICATION OF WOUND VAC;  Surgeon: Elam Dutch, MD;  Location: [place];  Service: Vascular;  Laterality: Left;  . CARDIAC CATHETERIZATION  most recent 05/2011   total of 4  . CAROTID ANGIOGRAM N/A 06/05/2011   Procedure: CAROTID ANGIOGRAM;  Surgeon: Elam Dutch, MD;  Location: [facility] CATH LAB;  Service: Cardiovascular;  Laterality: N/A;  . CAROTID ENDARTERECTOMY  04/08/2011   left CEA  . cataract surgery Bilateral    left  . COLONOSCOPY    . COLONOSCOPY WITH PROPOFOL N/A 08/22/2013   Procedure: COLONOSCOPY WITH PROPOFOL;  Surgeon: Garlan Fair, MD;  Location: [facility] ENDOSCOPY;  Service: Endoscopy;  Laterality: N/A;  . CORONARY ARTERY BYPASS GRAFT  09/09/2011   Procedure: CORONARY ARTERY BYPASS GRAFTING (CABG);  Surgeon: Gaye Pollack, MD;  Location: [place];  Service: Open Heart Surgery;  Laterality: N/A;  Coronary artery bypass grafting  x three with Right saphenous vein  harvested endoscopically and left internal mammary artery  . ENDARTERECTOMY  04/08/2011   Procedure: ENDARTERECTOMY CAROTID;  Surgeon: Elam Dutch, MD;  Location: [facility] OR;  Service: Vascular;  Laterality: Left;  with patch angioplasty  . ENDARTERECTOMY  09/09/2011   Procedure: ENDARTERECTOMY CAROTID;  Surgeon: Elam Dutch, MD;  Location: [facility] OR;  Service: Vascular;  Laterality: Right;  with patch angioplasty  . FEMORAL-POPLITEAL BYPASS GRAFT Left 10/25/2017   Procedure: Left Common Femoral Endarterectomy and perfundaplasty, Left BYPASS GRAFT FEMORAL-BELOW KNEE POPLITEAL ARTERY.;  Surgeon: Elam Dutch, MD;  Location: [place];  Service: Vascular;  Laterality: Left;  . GROIN DEBRIDEMENT Left 11/10/2017   Procedure: GROIN DEBRIDEMENT;  Surgeon: Elam Dutch, MD;  Location: [place];  Service: Vascular;  Laterality: Left;  . LEFT HEART CATHETERIZATION WITH CORONARY ANGIOGRAM N/A 07/09/2011   Procedure: LEFT HEART CATHETERIZATION WITH CORONARY ANGIOGRAM;  Surgeon: Sinclair Grooms, MD;  Location: [facility] CATH LAB;  Service: Cardiovascular;  Laterality: N/A;  . LOWER EXTREMITY ANGIOGRAPHY N/A 10/11/2017   Procedure: LOWER EXTREMITY ANGIOGRAPHY;  Surgeon: Waynetta Sandy, MD;  Location: [place] CV LAB;  Service: Cardiovascular;  Laterality: N/A;  . Reimplantation of inferior mesenteric artery    . Repair of infrarenal abdominal aortic aneurysm    . SHOULDER ARTHROSCOPY W/ ROTATOR CUFF REPAIR     right and left-open procedures  . TONSILLECTOMY     at age 45  . TRIGGER FINGER RELEASE Right 06/28/2014   Procedure: RIGHT LONG TRIGGER RELEASE ;  Surgeon: Leanora Cover, MD;  Location: [place];  Service: Orthopedics;  Laterality: Right;  . wisdom teeth extracted     as a teenager  . WOUND DEBRIDEMENT Left 11/05/2017   Procedure: DEBRIDEMENT WOUND LEFT GROIN with wound vac placement;  Surgeon: Elam Dutch, MD;  Location: [facility] OR;  Service: Vascular;  Laterality: Left;     Social History   Socioeconomic History  . Marital status: Married    Spouse name: Not on file  . Number of children: 2  . Years of education: Not on file  . Highest education level: Not on file  Occupational History  . Occupation: retired Chief Executive Officer  Social Needs  . Financial resource strain: Not on file  . Food insecurity:    Worry: Not on file    Inability: Not on file  . Transportation needs:    Medical: Not on file    Non-medical: Not on file  Tobacco Use  . Smoking status: Former Smoker    Years: 40.00    Types: Cigarettes    Last attempt to quit: 10/27/2005    Years  since quitting: 12.0  . Smokeless tobacco: Never Used  Substance and Sexual Activity  . Alcohol use: Yes    Alcohol/week: 1.2 - 1.8 oz    Types: 2 - 3 Cans of beer per week    Comment: occasional- weekly  . Drug use: No  . Sexual activity: Never  Lifestyle  . Physical activity:    Days per week: Not on file    Minutes per session: Not on file  . Stress: Not on file  Relationships  . Social connections:    Talks on phone: Not on file    Gets together: Not on file    Attends religious service: Not on file    Active member of club or organization: Not on file    Attends meetings of clubs or organizations: Not on file    Relationship status: Not on file  . Intimate partner violence:    Fear of current or ex partner: Not on file    Emotionally abused: Not on file    Physically abused: Not on file    Forced sexual activity: Not on file  Other Topics Concern  . Not on file  Social History Narrative   He lives with wife in a 1 story home, three steps to entire home.  2 children.  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      Family History  Problem Relation Age of Onset  . Heart disease Mother   . Heart disease Father   . Anesthesia problems Neg Hx   . Hypotension Neg Hx   . Malignant hyperthermia Neg Hx   . Pseudochol deficiency Neg Hx     Anti-infectives: Anti-infectives (From admission,  onward)   Start     Dose/Rate Route Frequency Ordered Stop   11/15/17 2000  ciprofloxacin (CIPRO) tablet 500 mg     500 mg Oral 2 times daily 11/15/17 1716     11/11/17 0845  ceFAZolin (ANCEF) IVPB 1 g/50 mL premix  Status:  Discontinued     1 g 100 mL/hr over 30 Minutes Intravenous Every 8 hours 11/11/17 0831 11/15/17 1716   11/05/17 2200  vancomycin (VANCOCIN) 1,250 mg in sodium chloride 0.9 % 250 mL IVPB  Status:  Discontinued     1,250 mg 166.7 mL/hr over 90 Minutes Intravenous Every 24 hours 11/04/17 1851 11/06/17 1514   11/04/17 2200  piperacillin-tazobactam (ZOSYN) IVPB 3.375 g  Status:  Discontinued     3.375 g 12.5 mL/hr over 240 Minutes Intravenous Every 8 hours 11/04/17 1851 11/11/17 0830   11/04/17 1900  vancomycin (VANCOCIN) 2,000 mg in sodium chloride 0.9 % 500 mL IVPB     2,000 mg 250 mL/hr over 120 Minutes Intravenous  Once 11/04/17 1851 11/05/17 0009      Current Facility-Administered Medications  Medication Dose Route Frequency Provider Last Rate Last Dose  . 0.9 %  sodium chloride infusion   Intravenous PRN Elam Dutch, MD 10 mL/hr at 11/15/17 1649    . acetaminophen (TYLENOL) tablet 325-650 mg  325-650 mg Oral Q4H PRN Rhyne, Samantha J, PA-C       Or  . acetaminophen (TYLENOL) suppository 325-650 mg  325-650 mg Rectal Q4H PRN Rhyne, Samantha J, PA-C      . alum & mag hydroxide-simeth (MAALOX/MYLANTA) 200-200-20 MG/5ML suspension 15-30 mL  15-30 mL Oral Q2H PRN Rhyne, Samantha J, PA-C   30 mL at 11/13/17 1034  . aspirin EC tablet 325 mg  325 mg Oral q1800 Rhyne, Hulen Shouts,  PA-C   325 mg at 11/15/17 1723  . atorvastatin (LIPITOR) tablet 20 mg  20 mg Oral QPM Rhyne, Samantha J, PA-C   20 mg at 11/15/17 1723  . ciprofloxacin (CIPRO) tablet 500 mg  500 mg Oral BID Rosetta Posner, MD   500 mg at 11/15/17 2123  . dextromethorphan-guaiFENesin (MUCINEX DM) 30-600 MG per 12 hr tablet 1 tablet  1 tablet Oral BID PRN Rhyne, Hulen Shouts, PA-C   1 tablet at 11/11/17 1056  .  docusate sodium (COLACE) capsule 100 mg  100 mg Oral BID PRN Hosie Poisson, MD   100 mg at 11/13/17 1135  . feeding supplement (PRO-STAT SUGAR FREE 64) liquid 30 mL  30 mL Oral BID Rhyne, Samantha J, PA-C   30 mL at 11/15/17 1000  . fenofibrate tablet 54 mg  54 mg Oral Daily Rhyne, Hulen Shouts, PA-C   54 mg at 11/15/17 0827  . furosemide (LASIX) tablet 40 mg  40 mg Oral Daily Janora Norlander, MD   40 mg at 11/15/17 0746  . gabapentin (NEURONTIN) capsule 300 mg  300 mg Oral QHS Rhyne, Samantha J, PA-C   300 mg at 11/15/17 2124  . gabapentin (NEURONTIN) capsule 600 mg  600 mg Oral Daily Rhyne, Hulen Shouts, PA-C   600 mg at 11/15/17 0747  . heparin injection 5,000 Units  5,000 Units Subcutaneous Q8H Rhyne, Samantha J, PA-C   5,000 Units at 11/15/17 2125  . hydrALAZINE (APRESOLINE) injection 5 mg  5 mg Intravenous Q20 Min PRN Rhyne, Hulen Shouts, PA-C      . HYDROcodone-acetaminophen (NORCO/VICODIN) 5-325 MG per tablet 1 tablet  1 tablet Oral Q6H PRN Gabriel Earing, PA-C   1 tablet at 11/15/17 2123  . HYDROmorphone (DILAUDID) injection 0.5-1 mg  0.5-1 mg Intravenous Q2H PRN Rhyne, Samantha J, PA-C   1 mg at 11/08/17 1547  . insulin aspart (novoLOG) injection 0-15 Units  0-15 Units Subcutaneous TID WC Rhyne, Samantha J, PA-C   3 Units at 11/15/17 1256  . labetalol (NORMODYNE,TRANDATE) injection 10 mg  10 mg Intravenous Q10 min PRN Rhyne, Samantha J, PA-C      . levothyroxine (SYNTHROID, LEVOTHROID) tablet 50 mcg  50 mcg Oral QAC breakfast Gabriel Earing, PA-C   50 mcg at 11/15/17 0746  . loratadine (CLARITIN) tablet 10 mg  10 mg Oral Daily Rhyne, Samantha J, PA-C   10 mg at 11/15/17 0747  . metoprolol tartrate (LOPRESSOR) injection 2-5 mg  2-5 mg Intravenous Q2H PRN Rhyne, Samantha J, PA-C      . metoprolol tartrate (LOPRESSOR) tablet 50 mg  50 mg Oral BID Rhyne, Samantha J, PA-C   50 mg at 11/15/17 2124  . multivitamin with minerals tablet 1 tablet  1 tablet Oral Daily Gabriel Earing, PA-C   1  tablet at 11/15/17 0747  . nitroGLYCERIN (NITROSTAT) SL tablet 0.4 mg  0.4 mg Sublingual Q5 min PRN Rhyne, Samantha J, PA-C      . ondansetron (ZOFRAN) injection 4 mg  4 mg Intravenous Q6H PRN Rhyne, Samantha J, PA-C   4 mg at 11/05/17 0012  . oxymetazoline (AFRIN) 0.05 % nasal spray 1 spray  1 spray Each Nare BID PRN Rhyne, Samantha J, PA-C      . pantoprazole (PROTONIX) EC tablet 40 mg  40 mg Oral Daily Rhyne, Samantha J, PA-C   40 mg at 11/15/17 0748  . phenol (CHLORASEPTIC) mouth spray 1 spray  1 spray Mouth/Throat PRN Rhyne, Hulen Shouts,  PA-C      . pioglitazone (ACTOS) tablet 30 mg  30 mg Oral Q lunch Rhyne, Samantha J, PA-C   30 mg at 11/15/17 1251  . polyethylene glycol (MIRALAX / GLYCOLAX) packet 17 g  17 g Oral Daily PRN Janora Norlander, MD   17 g at 11/13/17 1135  . polyvinyl alcohol (LIQUIFILM TEARS) 1.4 % ophthalmic solution 1 drop  1 drop Both Eyes BID PRN Rhyne, Samantha J, PA-C      . ramipril (ALTACE) capsule 10 mg  10 mg Oral Daily Janora Norlander, MD   10 mg at 11/15/17 0746  . senna-docusate (Senokot-S) tablet 1 tablet  1 tablet Oral QHS PRN Hosie Poisson, MD         Objective: Vital signs in last 24 hours: Temp:  [98.2 F (36.8 C)-98.5 F (36.9 C)] 98.4 F (36.9 C) (06/24 1950) Pulse Rate:  [59-93] 72 (06/24 1950) Resp:  [21-29] 26 (06/24 1950) BP: (137-147)/(43-80) 144/43 (06/24 1950) SpO2:  [83 %-97 %] 94 % (06/24 1950)  Intake/Output from previous day: 06/23 0701 - 06/24 0700 In: 870 [P.O.:720; IV Piggyback:150] Out: 2800 [Urine:2800] Intake/Output this shift: No intake/output data recorded.   Physical Exam  Constitutional:  Obese, WN in NAD  Abdominal: Soft. There is no tenderness.  Obese, NT  Genitourinary:  Genitourinary Comments: Foley indwelling draining clear urine.  Minimal penoscrotal edema.     Rectal not done with patient difficult to move in bed.   Neurological:  Alert but not able to clearly answer questions.     Lab Results:  Recent  Labs    11/14/17 0902 11/15/17 0342  WBC 13.5* 6.1  HGB 11.6* 9.9*  HCT 36.4* 31.4*  PLT 228 289   BMET Recent Labs    11/14/17 0902 11/15/17 0342  NA 136 136  K 4.5 3.4*  CL 106 100*  CO2 23 28  GLUCOSE 155* 150*  BUN 56* 31*  CREATININE 1.33* 1.26*  CALCIUM 8.6* 8.3*   PT/INR No results for input(s): LABPROT, INR in the last 72 hours. ABG No results for input(s): PHART, HCO3 in the last 72 hours.  Invalid input(s): PCO2, PO2  Studies/Results: No results found.   Assessment: BPH with urinary retention with foley placed for 1263ml PVR on 6/21.   Begin tamsulosin if ok from a medical standpoint.  Consider voiding trial in 2-3 days if not discharged, otherwise have him f/u in the office in 1-2 weeks for a voiding trial.   It doesn't sound like he was in prolonged retention so hopefully he doesn't have much bladder disfunction.    CC: Dr. Hosie Poisson     [name] 11/15/2017 [phone]

## 2017-11-15 NOTE — Progress Notes (Signed)
PROGRESS NOTE    [name]  YIF:[id number] DOB: 12-15-1940 DOA: 11/04/2017 PCP: Lavone Orn, MD    Brief Narrative:  [name] is an 77 y.o. male with PVD, HTN, CKD III, hyperlipidemia, DM2, hypothyroidism, carotid artery stenosis s/p CEA, and remote h/o aortofemoral bypass who had a recent L  popliteal-femoral bypass due to a nonhealing ulcer on his L great toe. His postop course has been complicated by L groin wound infection with pansensitive E. Coli and Klebsiella pneumoniae. He underwent debridement and placement of a wound vac  in the OR and has been started on IV antibiotics.  Medical service has been consulted for management of his DM and hypertension.    Assessment & Plan:   Active Problems:   Diabetes mellitus type 2 with complications (HCC)   S/P CABG (coronary artery bypass graft)   Peripheral vascular disease, unspecified (HCC)   Essential hypertension   Chronic diastolic heart failure (HCC)   Chronic kidney disease, stage III (moderate) (HCC)   Polyneuropathy associated with underlying disease ([place])   Surgical wound infection   Volume overload   Left groin wound infection: Currently on cefazolin and further management as per vascular surgery.  CULTURES FROM the groin from 6/19 growing klebsiella and E coli sensitive to cephalosporins.     Mild acute on chronic diastolic heart failure:  - getting  lasix 40 mg daily , monitor renal parameters on lasix. Creatinine stable at 1.3 - strict intake and output.  - daily weights.  BUN at 31.     Urinary retention: acute and has foley catheter. Urology consulted and UA ordered.  He see's Dr Dorina Hoyer in the office.  Dr Jeffie Pollock is on call and is aware.    Stage 3 CKD: - creatinine better  - outpatient follow up with a nephrologist.    Diabetes mellitus:  CBG (last 3)  Recent Labs    11/14/17 2131 11/15/17 0606 11/15/17 1121  GLUCAP 176* 155* 173*   cbgs well controlled. Resume SSI and home  meds. No change.     Hypertension  Well controlled. , currently on lopressor, ramipril and lasix.    Hyperlipidemia  On lipitor.   PAD: On aspirin 325 mg daily.    Polyneuropathy:  On gabapentin. Continue the same.     Hypothyroidism:  Resume synthroid.no change in meds.   Constipation:  Enema ordered and constipation resolved.    Hypokalemia; replaced. Repeat in am    DVT prophylaxis: heparin Code Status: full code.  Family Communication: none at bedside.  Disposition Plan: per vascular surgery.   Procedures: L  popliteal-femoral bypass     Antimicrobials: cefazolin    Subjective: No chest pain, or sob. Worked with PT , tired, but okay  Objective: Vitals:   11/15/17 0900 11/15/17 1100 11/15/17 1300 11/15/17 1323  BP:    137/80  Pulse:    93  Resp: (!) 23 (!) 29 (!) 25 (!) 21  Temp:    98.5 F (36.9 C)  TempSrc:    Oral  SpO2:    97%  Weight:      Height:        Intake/Output Summary (Last 24 hours) at 11/15/2017 1603 Last data filed at 11/15/2017 1200 Gross per 24 hour  Intake 1350 ml  Output 2800 ml  Net -1450 ml   Filed Weights   11/05/17 0504 11/05/17 1213  Weight: 123.4 kg (272 lb 0.8 oz) 123.4 kg (272 lb)    Examination:  General  exam: Appears  Calm and comfortable. Not in distress.  Respiratory system: bilateral air entry fair , no wheezing or rhonchi.  Cardiovascular system: S1 & S2 heard, RRR. No JVD, murmurs,  Gastrointestinal system: Abdomen is soft non tender non distended bowel sounds heard.  Central nervous system: Alert and oriented. Non focal. Extremities: Symmetric 5 x 5 power. 2+ edema, GROIN wound connected to wound vac.  Skin: No rashes, lesions or ulcers Psychiatry: calm.     Data Reviewed: I have personally reviewed following labs and imaging studies  CBC: Recent Labs  Lab 11/10/17 0336 11/12/17 0632 11/13/17 0656 11/14/17 0902 11/15/17 0342  WBC 10.1 6.7 5.8 13.5* 6.1  HGB 9.8* 11.0* 9.5* 11.6* 9.9*    HCT 31.8* 34.5* 30.3* 36.4* 31.4*  MCV 95.8 95.3 96.5 102.0* 96.9  PLT 374 323 324 228 149   Basic Metabolic Panel: Recent Labs  Lab 11/11/17 1316 11/12/17 0632 11/13/17 0656 11/14/17 0902 11/15/17 0342  NA 131* 130* 131* 136 136  K 4.2 4.4 3.7 4.5 3.4*  CL 95* 97* 98* 106 100*  CO2 27 24 26 23 28   GLUCOSE 186* 167* 129* 155* 150*  BUN 33* 35* 34* 56* 31*  CREATININE 1.67* 1.59* 1.55* 1.33* 1.26*  CALCIUM 8.3* 8.1* 8.2* 8.6* 8.3*   GFR: Estimated Creatinine Clearance: 64.6 mL/min (A) (by C-G formula based on SCr of 1.26 mg/dL (H)). Liver Function Tests: Recent Labs  Lab 11/09/17 0323  ALBUMIN 2.0*   No results for input(s): LIPASE, AMYLASE in the last 168 hours. No results for input(s): AMMONIA in the last 168 hours. Coagulation Profile: Recent Labs  Lab 11/10/17 0336  INR 1.27   Cardiac Enzymes: No results for input(s): CKTOTAL, CKMB, CKMBINDEX, TROPONINI in the last 168 hours. BNP (last 3 results) No results for input(s): PROBNP in the last 8760 hours. HbA1C: No results for input(s): HGBA1C in the last 72 hours. CBG: Recent Labs  Lab 11/14/17 1207 11/14/17 1628 11/14/17 2131 11/15/17 0606 11/15/17 1121  GLUCAP 151* 139* 176* 155* 173*   Lipid Profile: No results for input(s): CHOL, HDL, LDLCALC, TRIG, CHOLHDL, LDLDIRECT in the last 72 hours. Thyroid Function Tests: No results for input(s): TSH, T4TOTAL, FREET4, T3FREE, THYROIDAB in the last 72 hours. Anemia Panel: No results for input(s): VITAMINB12, FOLATE, FERRITIN, TIBC, IRON, RETICCTPCT in the last 72 hours. Sepsis Labs: No results for input(s): PROCALCITON, LATICACIDVEN in the last 168 hours.  Recent Results (from the past 240 hour(s))  Aerobic Culture (superficial specimen)     Status: None   Collection Time: 11/10/17  9:53 AM  Result Value Ref Range Status   Specimen Description WOUND LEFT GROIN  Final   Special Requests NONE  Final   Gram Stain   Final    NO WBC SEEN NO ORGANISMS  SEEN Performed at [facility], [address], [place], [place] [address]    Culture   Final    RARE KLEBSIELLA PNEUMONIAE RARE ESCHERICHIA COLI    Report Status 11/12/2017 FINAL  Final   Organism ID, Bacteria KLEBSIELLA PNEUMONIAE  Final   Organism ID, Bacteria ESCHERICHIA COLI  Final      Susceptibility   Escherichia coli - MIC*    AMPICILLIN 8 SENSITIVE Sensitive     CEFAZOLIN <=4 SENSITIVE Sensitive     CEFEPIME <=1 SENSITIVE Sensitive     CEFTAZIDIME <=1 SENSITIVE Sensitive     CEFTRIAXONE <=1 SENSITIVE Sensitive     CIPROFLOXACIN <=0.25 SENSITIVE Sensitive  GENTAMICIN <=1 SENSITIVE Sensitive     IMIPENEM <=0.25 SENSITIVE Sensitive     TRIMETH/SULFA <=20 SENSITIVE Sensitive     AMPICILLIN/SULBACTAM <=2 SENSITIVE Sensitive     PIP/TAZO <=4 SENSITIVE Sensitive     Extended ESBL NEGATIVE Sensitive     * RARE ESCHERICHIA COLI   Klebsiella pneumoniae - MIC*    AMPICILLIN >=32 RESISTANT Resistant     CEFAZOLIN <=4 SENSITIVE Sensitive     CEFEPIME <=1 SENSITIVE Sensitive     CEFTAZIDIME <=1 SENSITIVE Sensitive     CEFTRIAXONE <=1 SENSITIVE Sensitive     CIPROFLOXACIN <=0.25 SENSITIVE Sensitive     GENTAMICIN <=1 SENSITIVE Sensitive     IMIPENEM <=0.25 SENSITIVE Sensitive     TRIMETH/SULFA <=20 SENSITIVE Sensitive     AMPICILLIN/SULBACTAM 4 SENSITIVE Sensitive     PIP/TAZO <=4 SENSITIVE Sensitive     Extended ESBL NEGATIVE Sensitive     * RARE KLEBSIELLA PNEUMONIAE         Radiology Studies: No results found.      Scheduled Meds: . aspirin EC  325 mg Oral q1800  . atorvastatin  20 mg Oral QPM  . feeding supplement (PRO-STAT SUGAR FREE 64)  30 mL Oral BID  . fenofibrate  54 mg Oral Daily  . furosemide  40 mg Oral Daily  . gabapentin  300 mg Oral QHS  . gabapentin  600 mg Oral Daily  . heparin  5,000 Units Subcutaneous Q8H  . insulin aspart  0-15 Units Subcutaneous TID WC  . levothyroxine  50 mcg Oral QAC breakfast  . loratadine  10 mg Oral Daily   . metoprolol tartrate  50 mg Oral BID  . multivitamin with minerals  1 tablet Oral Daily  . pantoprazole  40 mg Oral Daily  . pioglitazone  30 mg Oral Q lunch  . ramipril  10 mg Oral Daily   Continuous Infusions: . sodium chloride 1,000 mL (11/12/17 0806)  .  ceFAZolin (ANCEF) IV 1 g (11/15/17 1423)     LOS: 11 days    Time spent: 30 MINUTES.     Hosie Poisson, MD [facility] Pager [phone]  If 7PM-7AM, please contact night-coverage [email or web address] Password [facility] 11/15/2017, 4:03 PM

## 2017-11-15 NOTE — Plan of Care (Signed)
  Problem: Education: Goal: Knowledge of General Education information will improve Outcome: Progressing   Problem: Health Behavior/Discharge Planning: Goal: Ability to manage health-related needs will improve Outcome: Progressing   

## 2017-11-15 NOTE — Progress Notes (Addendum)
Progress Note    11/15/2017 7:28 AM 5 Days Post-Op  Subjective:  Sleeping; wakes easily  Afebrile HR 60's NSR 188'C-166'A systolic 63% RA  Vitals:   11/15/17 0017 11/15/17 0502  BP: (!) 143/45 (!) 147/50  Pulse: (!) 59 63  Resp: (!) 23 (!) 26  Temp: 98.2 F (36.8 C) 98.4 F (36.9 C)  SpO2: 95% 92%    Physical Exam: Cardiac:  regular Lungs:  Non labored Incisions:  Leg incisions are clean and dry with staples    Extremities:  Less edema left leg; easily palpable left DP pulse Abdomen:  Soft, NT  CBC    Component Value Date/Time   WBC 6.1 11/15/2017 0342   RBC 3.24 (L) 11/15/2017 0342   HGB 9.9 (L) 11/15/2017 0342   HCT 31.4 (L) 11/15/2017 0342   PLT 289 11/15/2017 0342   MCV 96.9 11/15/2017 0342   MCH 30.6 11/15/2017 0342   MCHC 31.5 11/15/2017 0342   RDW 18.4 (H) 11/15/2017 0342   LYMPHSABS 0.7 10/28/2017 2219   MONOABS 0.8 10/28/2017 2219   EOSABS 0.1 10/28/2017 2219   BASOSABS 0.0 10/28/2017 2219    BMET    Component Value Date/Time   NA 136 11/15/2017 0342   K 3.4 (L) 11/15/2017 0342   CL 100 (L) 11/15/2017 0342   CO2 28 11/15/2017 0342   GLUCOSE 150 (H) 11/15/2017 0342   BUN 31 (H) 11/15/2017 0342   CREATININE 1.26 (H) 11/15/2017 0342   CREATININE 1.32 04/18/2012 0949   CALCIUM 8.3 (L) 11/15/2017 0342   GFRNONAA 53 (L) 11/15/2017 0342   GFRAA >60 11/15/2017 0342    INR    Component Value Date/Time   INR 1.27 11/10/2017 0336     Intake/Output Summary (Last 24 hours) at 11/15/2017 0728 Last data filed at 11/15/2017 0600 Gross per 24 hour  Intake 870 ml  Output 2800 ml  Net -1930 ml      Component 5d ago  Specimen Description WOUND LEFT GROIN   Special Requests NONE   Gram Stain NO WBC SEEN  NO ORGANISMS SEEN  Performed at [facility], [address], [place], [place] [address]     Culture RARE KLEBSIELLA PNEUMONIAE  RARE ESCHERICHIA COLI     Report Status 11/12/2017 FINAL   Organism ID, Bacteria KLEBSIELLA  PNEUMONIAE   Organism ID, Bacteria ESCHERICHIA COLI   Resulting Agency [facility]  Susceptibility    Klebsiella pneumoniae Escherichia coli    MIC MIC    AMPICILLIN >=32 RESIST... Resistant 8 SENSITIVE  Sensitive    AMPICILLIN/SULBACTAM 4 SENSITIVE  Sensitive <=2 SENSITIVE  Sensitive    CEFAZOLIN <=4 SENSITIVE  Sensitive <=4 SENSITIVE  Sensitive    CEFEPIME <=1 SENSITIVE  Sensitive <=1 SENSITIVE  Sensitive    CEFTAZIDIME <=1 SENSITIVE  Sensitive <=1 SENSITIVE  Sensitive    CEFTRIAXONE <=1 SENSITIVE  Sensitive <=1 SENSITIVE  Sensitive    CIPROFLOXACIN <=0.25 SENS... Sensitive <=0.25 SENS... Sensitive    Extended ESBL NEGATIVE  Sensitive NEGATIVE  Sensitive    GENTAMICIN <=1 SENSITIVE  Sensitive <=1 SENSITIVE  Sensitive    IMIPENEM <=0.25 SENS... Sensitive <=0.25 SENS... Sensitive    PIP/TAZO <=4 SENSITIVE  Sensitive <=4 SENSITIVE  Sensitive    TRIMETH/SULFA <=20 SENSIT... Sensitive <=20 SENSIT... Sensitive           Assessment:  77 y.o. male is s/p:  Groin debridement with wound vac   5 Days Post-Op  Plan: -pt's wound is clean with more  granulation tissue; no purulence noted -needs to continue to mobilize -DVT prophylaxis:  Sq heparin -wound cx with Klebsiella pneumoniae and E coli-sensitive to cipro.  Will d/w Dr. Donnetta Hutching.  Continue IV Cefazolin for now.   Leontine Locket, PA-C [facility] [phone] 11/15/2017 7:28 AM  I have examined the patient, reviewed and agree with above.  Comfortable..  At 5 AM and 5 PM.  Patient wife plan to discontinue Ancef and switch to oral Cipro 500 mg twice daily.  Groin culture grew Klebsiella and E. coli  Curt Jews, MD 11/15/2017 5:17 PM

## 2017-11-15 NOTE — Progress Notes (Signed)
Physical Therapy Treatment Patient Details Name: [name] MRN: [id number] DOB: 1941-04-21 Today's Date: 11/15/2017    History of Present Illness 77 y.o. patient who underwent left common femoral endarterectomy and left femoral to below-knee popliteal bypass 6/03, discharged to SNF, returns for follow up with left groin wound infection, debrided and wound vac placed 6/14. PMH includes: foot drop L, DM, peripheral neuropathy, PVD, L food wounds, B RTC repair    PT Comments    Pt making slow but steady progress. Pt able to amb to the hallway today. Sleepier initially but able to arouse and pt participatory.    Follow Up Recommendations  SNF;Supervision/Assistance - 24 hour     Equipment Recommendations  Other (comment)(TBD at SNF)    Recommendations for Other Services       Precautions / Restrictions Precautions Precautions: Fall    Mobility  Bed Mobility Overal bed mobility: Needs Assistance Bed Mobility: Supine to Sit     Supine to sit: Mod assist;+2 for physical assistance     General bed mobility comments: Assist to elevate trunk into sitting, bring legs off of bed,  and bring hips to EOB  Transfers Overall transfer level: Needs assistance Equipment used: Rolling walker (2 wheeled) Transfers: Sit to/from [facility] Sit to Stand: +2 physical assistance;Mod assist Stand pivot transfers: +2 physical assistance;Min assist       General transfer comment: Assist to bring hips up and for balance. Pt requires incr time to rise. From bed pt places hands on walker when coming to stand. From bsc and recliner pt uses hands on armrests to come up. Pt able to take pivotal steps from recliner to bed using walker.  Ambulation/Gait Ambulation/Gait assistance: +2 physical assistance;Min assist Gait Distance (Feet): 10 Feet(10'x 1, 5' x 1) Assistive device: Rolling walker (2 wheeled) Gait Pattern/deviations: Decreased step length - right;Decreased step  length - left;Trunk flexed;Step-through pattern Gait velocity: decr Gait velocity interpretation: <1.31 ft/sec, indicative of household ambulator General Gait Details: Assist for balance and support. Verbal cues to stand more erect. Pt had to use BSC during amb.   Stairs             Wheelchair Mobility    Modified Rankin (Stroke Patients Only)       Balance Overall balance assessment: Needs assistance Sitting-balance support: Feet supported;Bilateral upper extremity supported Sitting balance-Leahy Scale: Poor Sitting balance - Comments: UE support sitting EOB   Standing balance support: Bilateral upper extremity supported;During functional activity Standing balance-Leahy Scale: Poor Standing balance comment: walker and min assist once standing established.                             Cognition Arousal/Alertness: Awake/alert Behavior During Therapy: WFL for tasks assessed/performed Overall Cognitive Status: Within Functional Limits for tasks assessed                                 General Comments: Pt sleepy but able to arouse. Seemed slightly disoriented initially (thought he was on the bedpan) but then once fully aroused able to participate.      Exercises      General Comments        Pertinent Vitals/Pain Pain Assessment: Faces Faces Pain Scale: Hurts even more Pain Location: L groin Pain Descriptors / Indicators: Grimacing;Guarding Pain Intervention(s): Limited activity within patient's tolerance;Monitored during session;Repositioned    Home Living  Prior Function            PT Goals (current goals can now be found in the care plan section) Progress towards PT goals: Progressing toward goals    Frequency    Min 2X/week      PT Plan Current plan remains appropriate    Co-evaluation              AM-PAC PT "6 Clicks" Daily Activity  Outcome Measure  Difficulty turning over in  bed (including adjusting bedclothes, sheets and blankets)?: Unable Difficulty moving from lying on back to sitting on the side of the bed? : Unable Difficulty sitting down on and standing up from a chair with arms (e.g., wheelchair, bedside commode, etc,.)?: Unable Help needed moving to and from a bed to chair (including a wheelchair)?: A Lot Help needed walking in hospital room?: A Lot Help needed climbing 3-5 steps with a railing? : Total 6 Click Score: 8    End of Session Equipment Utilized During Treatment: Gait belt Activity Tolerance: Patient tolerated treatment well Patient left: with call bell/phone within reach;in bed Nurse Communication: Mobility status PT Visit Diagnosis: Unsteadiness on feet (R26.81);Other abnormalities of gait and mobility (R26.89);Pain Pain - Right/Left: Left Pain - part of body: Hip     Time: 1110-1150 PT Time Calculation (min) (ACUTE ONLY): 40 min  Charges:  $Gait Training: 23-37 mins $Therapeutic Activity: 8-22 mins                    G Codes:       [facility] PT [place] 11/15/2017, 2:03 PM

## 2017-11-15 NOTE — Progress Notes (Signed)
Pt noted with the following this am: 1.  Loose stools/questionable blood tinged 2.  Bouts of lethargy/confusion/slurred speech/mood changes.        -Family believes its Restoril and requests different sleep aid.   3.  Foley continues x 3 days   MD notified and new orders received:  Hemoccult stool UA & urinary consult for urinary retention

## 2017-11-15 NOTE — Progress Notes (Signed)
Pt family member requested that nursing not give pt his temazepam for sleep. She said that the pt is too sleepy the next day to do physical therapy. She suggested to just give him pain meds instead to help him sleep. Pt is alert and oriented x4. This RN discussed family member's concerns with pt since pt is still able to make decisions for himself. Pt still wanted his temazepam. This RN administered medication per MD order. Will continue to follow.

## 2017-11-15 NOTE — Progress Notes (Signed)
Family reported pt has changes in mood and sleepy- daughter notified that Restoril was given at his request the night before and discussed it with father and his wife- which stated he was taking it at home with no problems.  Family requests no Restoril to be giving tonight b/c he needs to be clear headed and awake for physical therapy on Monday, suggesting pain pill could be given if not able to rest.  No changes in attitude towards staff.  OOB with stedy & 5 x standing.

## 2017-11-16 LAB — CBC
HCT: 33.1 % — ABNORMAL LOW (ref 39.0–52.0)
Hemoglobin: 10.1 g/dL — ABNORMAL LOW (ref 13.0–17.0)
MCH: 30 pg (ref 26.0–34.0)
MCHC: 30.5 g/dL (ref 30.0–36.0)
MCV: 98.2 fL (ref 78.0–100.0)
PLATELETS: 268 10*3/uL (ref 150–400)
RBC: 3.37 MIL/uL — ABNORMAL LOW (ref 4.22–5.81)
RDW: 18.6 % — AB (ref 11.5–15.5)
WBC: 5.6 10*3/uL (ref 4.0–10.5)

## 2017-11-16 LAB — BASIC METABOLIC PANEL
Anion gap: 7 (ref 5–15)
BUN: 30 mg/dL — ABNORMAL HIGH (ref 8–23)
CALCIUM: 8.3 mg/dL — AB (ref 8.9–10.3)
CO2: 27 mmol/L (ref 22–32)
CREATININE: 1.3 mg/dL — AB (ref 0.61–1.24)
Chloride: 100 mmol/L (ref 98–111)
GFR, EST AFRICAN AMERICAN: 59 mL/min — AB (ref >60)
GFR, EST NON AFRICAN AMERICAN: 51 mL/min — AB (ref >60)
GLUCOSE: 127 mg/dL — AB (ref 70–99)
Potassium: 3.9 mmol/L (ref 3.5–5.1)
Sodium: 134 mmol/L — ABNORMAL LOW (ref 135–145)

## 2017-11-16 LAB — GLUCOSE, CAPILLARY
GLUCOSE-CAPILLARY: 152 mg/dL — AB (ref 70–99)
Glucose-Capillary: 137 mg/dL — ABNORMAL HIGH (ref 70–99)
Glucose-Capillary: 137 mg/dL — ABNORMAL HIGH (ref 70–99)
Glucose-Capillary: 150 mg/dL — ABNORMAL HIGH (ref 70–99)

## 2017-11-16 LAB — OCCULT BLOOD X 1 CARD TO LAB, STOOL: Fecal Occult Bld: NEGATIVE

## 2017-11-16 NOTE — Progress Notes (Addendum)
Pt transferred with walker x 1 nurse from bed to chair, then from chair to [facility] and back to bed w/o difficulty this afternoon.

## 2017-11-16 NOTE — Progress Notes (Signed)
Pt transferred via walker with assistance with nurse and nurse tech to [facility] and back to bed.

## 2017-11-16 NOTE — Progress Notes (Signed)
PROGRESS NOTE    [name]  JKD:[id number] DOB: 08-May-1941 DOA: 11/04/2017 PCP: Lavone Orn, MD    Brief Narrative:  [name] is an 77 y.o. male with PVD, HTN, CKD III, hyperlipidemia, DM2, hypothyroidism, carotid artery stenosis s/p CEA, and remote h/o aortofemoral bypass who had a recent L  popliteal-femoral bypass due to a nonhealing ulcer on his L great toe. His postop course has been complicated by L groin wound infection with pansensitive E. Coli and Klebsiella pneumoniae. He underwent debridement and placement of a wound vac  in the OR and has been started on IV antibiotics.  Medical service has been consulted for management of his DM and hypertension.    Assessment & Plan:   Active Problems:   Diabetes mellitus type 2 with complications (HCC)   S/P CABG (coronary artery bypass graft)   Peripheral vascular disease, unspecified (HCC)   Essential hypertension   Chronic diastolic heart failure (HCC)   Chronic kidney disease, stage III (moderate) (HCC)   Polyneuropathy associated with underlying disease ([place])   Surgical wound infection   Volume overload   Left groin wound infection: He was started cefazolin, transitioned to oral antibiotics to complete the course and further management as per vascular surgery.  Cultures from the groin wound  from 6/19 growing klebsiella and E coli sensitive to cephalosporins.     Mild acute on chronic diastolic heart failure:   he appears to have improved, and compensated.   getting  lasix 40 mg daily , monitor renal parameters on lasix. Creatinine stable at 1.3 strict intake and output.  daily weights.  BUN at 31.     Urinary retention: probably from BPH , acute , foley catheter placed. . Urology consulted and UA ordered. UA does not show any infection.  He see's Dr Dorina Hoyer in the office.  Dr Jeffie Pollock recommended flomax, consider voiding trial in 2 to 3 days , if not successful or get discharged before then, plan for  follow up in the office in 1 to 2 weeks for a voiding trial.    Stage 3 CKD: - creatinine better  - outpatient follow up with a nephrologist.    Diabetes mellitus:  CBG (last 3)  Recent Labs    11/15/17 2116 11/16/17 0606 11/16/17 1137  GLUCAP 174* 137* 137*   cbgs well controlled. Resume SSI and home meds. No change in meds.     Hypertension  Well controlled. , currently on lopressor, ramipril and lasix.    Hyperlipidemia  On lipitor.   PAD: On aspirin 325 mg daily.    Polyneuropathy:  On gabapentin. Continue the same.     Hypothyroidism:  Resume synthroid.no change in meds.   Constipation:  Enema ordered and constipation resolved.    Hypokalemia; replaced. Repeat in am    DVT prophylaxis: heparin Code Status: full code.  Family Communication: none at bedside.  Disposition Plan: per vascular surgery. Possibly d/c to SNF   Procedures: L  popliteal-femoral bypass    Antimicrobials: cefazolin    Subjective: No chest pain, or sob. Answered his questions regarding his foley catheter.   Objective: Vitals:   11/15/17 2310 11/16/17 0533 11/16/17 0834 11/16/17 1128  BP: (!) 134/43 (!) 145/54 (!) 158/64 (!) 150/52  Pulse: 70 66 67 65  Resp: 19 (!) 22 (!) 28 (!) 28  Temp: 98.5 F (36.9 C) 98 F (36.7 C) (!) 97.5 F (36.4 C) 97.7 F (36.5 C)  TempSrc: Oral Oral Oral Oral  SpO2:  95% 96%    Weight:      Height:        Intake/Output Summary (Last 24 hours) at 11/16/2017 1219 Last data filed at 11/16/2017 0533 Gross per 24 hour  Intake 216.33 ml  Output 500 ml  Net -283.67 ml   Filed Weights   11/05/17 0504 11/05/17 1213  Weight: 123.4 kg (272 lb 0.8 oz) 123.4 kg (272 lb)    Examination:  General exam: in good spirits, no distress.  Respiratory system: air entry fair  Bilateral. No wheezing or rhonchi heard.  Cardiovascular system: S1 & S2 heard, RRR. No JVD, murmurs,  Gastrointestinal system: Abdomen is soft NT ND BS+ Central nervous  system: Alert and oriented. Non focal. Extremities: Symmetric 5 x 5 power. 2+ edema, groin wound connected to wound vac. Leg incisions appear okay.  Skin: No rashes, lesions or ulcers Psychiatry: calm.     Data Reviewed: I have personally reviewed following labs and imaging studies  CBC: Recent Labs  Lab 11/12/17 0632 11/13/17 0656 11/14/17 0902 11/15/17 0342 11/16/17 0503  WBC 6.7 5.8 13.5* 6.1 5.6  HGB 11.0* 9.5* 11.6* 9.9* 10.1*  HCT 34.5* 30.3* 36.4* 31.4* 33.1*  MCV 95.3 96.5 102.0* 96.9 98.2  PLT 323 324 228 289 973   Basic Metabolic Panel: Recent Labs  Lab 11/12/17 0632 11/13/17 0656 11/14/17 0902 11/15/17 0342 11/16/17 0503  NA 130* 131* 136 136 134*  K 4.4 3.7 4.5 3.4* 3.9  CL 97* 98* 106 100* 100  CO2 24 26 23 28 27   GLUCOSE 167* 129* 155* 150* 127*  BUN 35* 34* 56* 31* 30*  CREATININE 1.59* 1.55* 1.33* 1.26* 1.30*  CALCIUM 8.1* 8.2* 8.6* 8.3* 8.3*   GFR: Estimated Creatinine Clearance: 62.6 mL/min (A) (by C-G formula based on SCr of 1.3 mg/dL (H)). Liver Function Tests: No results for input(s): AST, ALT, ALKPHOS, BILITOT, PROT, ALBUMIN in the last 168 hours. No results for input(s): LIPASE, AMYLASE in the last 168 hours. No results for input(s): AMMONIA in the last 168 hours. Coagulation Profile: Recent Labs  Lab 11/10/17 0336  INR 1.27   Cardiac Enzymes: No results for input(s): CKTOTAL, CKMB, CKMBINDEX, TROPONINI in the last 168 hours. BNP (last 3 results) No results for input(s): PROBNP in the last 8760 hours. HbA1C: No results for input(s): HGBA1C in the last 72 hours. CBG: Recent Labs  Lab 11/15/17 1121 11/15/17 1607 11/15/17 2116 11/16/17 0606 11/16/17 1137  GLUCAP 173* 134* 174* 137* 137*   Lipid Profile: No results for input(s): CHOL, HDL, LDLCALC, TRIG, CHOLHDL, LDLDIRECT in the last 72 hours. Thyroid Function Tests: No results for input(s): TSH, T4TOTAL, FREET4, T3FREE, THYROIDAB in the last 72 hours. Anemia Panel: No  results for input(s): VITAMINB12, FOLATE, FERRITIN, TIBC, IRON, RETICCTPCT in the last 72 hours. Sepsis Labs: No results for input(s): PROCALCITON, LATICACIDVEN in the last 168 hours.  Recent Results (from the past 240 hour(s))  Aerobic Culture (superficial specimen)     Status: None   Collection Time: 11/10/17  9:53 AM  Result Value Ref Range Status   Specimen Description WOUND LEFT GROIN  Final   Special Requests NONE  Final   Gram Stain   Final    NO WBC SEEN NO ORGANISMS SEEN Performed at [facility], [address], [place], [place] [address]    Culture   Final    RARE KLEBSIELLA PNEUMONIAE RARE ESCHERICHIA COLI    Report Status 11/12/2017 FINAL  Final   Organism ID,  Bacteria KLEBSIELLA PNEUMONIAE  Final   Organism ID, Bacteria ESCHERICHIA COLI  Final      Susceptibility   Escherichia coli - MIC*    AMPICILLIN 8 SENSITIVE Sensitive     CEFAZOLIN <=4 SENSITIVE Sensitive     CEFEPIME <=1 SENSITIVE Sensitive     CEFTAZIDIME <=1 SENSITIVE Sensitive     CEFTRIAXONE <=1 SENSITIVE Sensitive     CIPROFLOXACIN <=0.25 SENSITIVE Sensitive     GENTAMICIN <=1 SENSITIVE Sensitive     IMIPENEM <=0.25 SENSITIVE Sensitive     TRIMETH/SULFA <=20 SENSITIVE Sensitive     AMPICILLIN/SULBACTAM <=2 SENSITIVE Sensitive     PIP/TAZO <=4 SENSITIVE Sensitive     Extended ESBL NEGATIVE Sensitive     * RARE ESCHERICHIA COLI   Klebsiella pneumoniae - MIC*    AMPICILLIN >=32 RESISTANT Resistant     CEFAZOLIN <=4 SENSITIVE Sensitive     CEFEPIME <=1 SENSITIVE Sensitive     CEFTAZIDIME <=1 SENSITIVE Sensitive     CEFTRIAXONE <=1 SENSITIVE Sensitive     CIPROFLOXACIN <=0.25 SENSITIVE Sensitive     GENTAMICIN <=1 SENSITIVE Sensitive     IMIPENEM <=0.25 SENSITIVE Sensitive     TRIMETH/SULFA <=20 SENSITIVE Sensitive     AMPICILLIN/SULBACTAM 4 SENSITIVE Sensitive     PIP/TAZO <=4 SENSITIVE Sensitive     Extended ESBL NEGATIVE Sensitive     * RARE KLEBSIELLA PNEUMONIAE          Radiology Studies: No results found.      Scheduled Meds: . aspirin EC  325 mg Oral q1800  . atorvastatin  20 mg Oral QPM  . ciprofloxacin  500 mg Oral BID  . feeding supplement (PRO-STAT SUGAR FREE 64)  30 mL Oral BID  . fenofibrate  54 mg Oral Daily  . furosemide  40 mg Oral Daily  . gabapentin  300 mg Oral QHS  . gabapentin  600 mg Oral Daily  . heparin  5,000 Units Subcutaneous Q8H  . insulin aspart  0-15 Units Subcutaneous TID WC  . levothyroxine  50 mcg Oral QAC breakfast  . loratadine  10 mg Oral Daily  . metoprolol tartrate  50 mg Oral BID  . multivitamin with minerals  1 tablet Oral Daily  . pantoprazole  40 mg Oral Daily  . pioglitazone  30 mg Oral Q lunch  . ramipril  10 mg Oral Daily  . tamsulosin  0.4 mg Oral Daily   Continuous Infusions: . sodium chloride 10 mL/hr at 11/15/17 1649     LOS: 12 days    Time spent: 30 MINUTES.     Hosie Poisson, MD [facility] Pager [phone]  If 7PM-7AM, please contact night-coverage [email or web address] Password [facility] 11/16/2017, 12:19 PM

## 2017-11-16 NOTE — Progress Notes (Addendum)
  Progress Note    11/16/2017 7:27 AM 6 Days Post-Op  Subjective:  Says he doesn't want Maguayo for his nurses aide today.  He has talked to his RN about it.  Says he walked in the hall with the walker either yesterday or the day before with PT.  Afebrile HR 50's-60's NSR 381'O-175'Z systolic 02% RA  Vitals:   11/15/17 2310 11/16/17 0533  BP: (!) 134/43 (!) 145/54  Pulse: 70 66  Resp: 19 (!) 22  Temp: 98.5 F (36.9 C) 98 F (36.7 C)  SpO2: 95% 96%    Physical Exam: Cardiac:  regular Lungs:  Non labored Incisions:  Left leg incisions clean with staples in tact; left groin with vac with good seal Extremities:  Easily palpable left DP pulse; less swelling today Abdomen:  Soft, NT/ND  CBC    Component Value Date/Time   WBC 5.6 11/16/2017 0503   RBC 3.37 (L) 11/16/2017 0503   HGB 10.1 (L) 11/16/2017 0503   HCT 33.1 (L) 11/16/2017 0503   PLT 268 11/16/2017 0503   MCV 98.2 11/16/2017 0503   MCH 30.0 11/16/2017 0503   MCHC 30.5 11/16/2017 0503   RDW 18.6 (H) 11/16/2017 0503   LYMPHSABS 0.7 10/28/2017 2219   MONOABS 0.8 10/28/2017 2219   EOSABS 0.1 10/28/2017 2219   BASOSABS 0.0 10/28/2017 2219    BMET    Component Value Date/Time   NA 134 (L) 11/16/2017 0503   K 3.9 11/16/2017 0503   CL 100 11/16/2017 0503   CO2 27 11/16/2017 0503   GLUCOSE 127 (H) 11/16/2017 0503   BUN 30 (H) 11/16/2017 0503   CREATININE 1.30 (H) 11/16/2017 0503   CREATININE 1.32 04/18/2012 0949   CALCIUM 8.3 (L) 11/16/2017 0503   GFRNONAA 51 (L) 11/16/2017 0503   GFRAA 59 (L) 11/16/2017 0503    INR    Component Value Date/Time   INR 1.27 11/10/2017 0336     Intake/Output Summary (Last 24 hours) at 11/16/2017 0727 Last data filed at 11/16/2017 0533 Gross per 24 hour  Intake 696.33 ml  Output 500 ml  Net 196.33 ml     Assessment:  77 y.o. male is s/p:  Groin debridement with wound vac  6 Days Post-Op  Plan: -wound vac changed yesterday-for vac change tomorrow.   -pt abx  switched to Cipro  -DVT prophylaxis:  Sq heparin -urinary retention-appreciate urology seeing pt.  Recommend leaving foley for 2-3 days and start on Flomax.  Voiding trial in 2-3 days if still here, otherwise, f/u in their office for voiding trial.   -anticipate transfer back to [facility] in the next few days.   Leontine Locket, PA-C [facility] [phone] 11/16/2017 7:27 AM

## 2017-11-16 NOTE — Care Management Note (Signed)
Case Management Note Marvetta Gibbons RN, BSN Unit 4E-Case Manager [phone]  Patient Details  Name: [name] MRN: [id number] Date of Birth: [birth year]  Subjective/Objective:  Pt readmitted with poorly healing left groin IV abx started on admission, s/p debridement and wound vAC placement on 6/14               Action/Plan: PTA pt had transitioned to [facility] after recent surgery. Prior to that home with wife. CSW has been following for return to [facility] when medically cleared for discharge. Have been notified by dept. Director that pt's daughter would like to meet with CM and CSW at the bedside tomorrow to discuss transition plan. CM will plan to meet with pt and family along with CSW on 6/26.   Expected Discharge Date:                  Expected Discharge Plan:  Skilled Nursing Facility  In-House Referral:  Clinical Social Work  Discharge planning Services  CM Consult  Post Acute Care Choice:    Choice offered to:     DME Arranged:    DME Agency:     HH Arranged:    [place] Agency:     Status of Service:  In process, will continue to follow  If discussed at Long Length of Stay Meetings, dates discussed:    Discharge Disposition: skilled facility  Additional Comments:  Dawayne Patricia, RN 11/16/2017, 4:01 PM

## 2017-11-16 NOTE — Progress Notes (Signed)
Physical Therapy Treatment Patient Details Name: [name] MRN: [id number] DOB: 14-Jan-1941 Today's Date: 11/16/2017    History of Present Illness 77 y.o. patient who underwent left common femoral endarterectomy and left femoral to below-knee popliteal bypass 6/03, discharged to SNF, returns for follow up with left groin wound infection, debrided and wound vac placed 6/14. PMH includes: foot drop L, DM, peripheral neuropathy, PVD, L food wounds, B RTC repair    PT Comments    Pt making good progress.   Follow Up Recommendations  SNF;Supervision/Assistance - 24 hour     Equipment Recommendations  Other (comment)(TBD at next venue)    Recommendations for Other Services       Precautions / Restrictions Precautions Precautions: Fall Precaution Comments: Wound vac    Mobility  Bed Mobility Overal bed mobility: Needs Assistance Bed Mobility: Sit to Supine     Supine to sit: Mod assist;+2 for safety/equipment Sit to supine: Mod assist   General bed mobility comments: Assist to bring legs back up into bed  Transfers Overall transfer level: Needs assistance Equipment used: Rolling walker (2 wheeled) Transfers: Sit to/from [employer] Transfers Sit to Stand: +2 physical assistance;Min assist Stand pivot transfers: +2 physical assistance;Min assist       General transfer comment: Assist to bring hips up and for balance. Verbal cues for hand placement and to bring feet back underneath. Used walker for chair to bed transfer  Ambulation/Gait Ambulation/Gait assistance: Min assist;+2 safety/equipment Gait Distance (Feet): 35 Feet(35' x 1, 12' x 1) Assistive device: Rolling walker (2 wheeled) Gait Pattern/deviations: Decreased step length - right;Decreased step length - left;Trunk flexed;Step-through pattern Gait velocity: decr Gait velocity interpretation: <1.31 ft/sec, indicative of household ambulator General Gait Details: Assist for balance and support. Verbal  cues to stand more erect and look up   Stairs             Wheelchair Mobility    Modified Rankin (Stroke Patients Only)       Balance Overall balance assessment: Needs assistance Sitting-balance support: Feet supported;No upper extremity supported Sitting balance-Leahy Scale: Fair     Standing balance support: Bilateral upper extremity supported;During functional activity Standing balance-Leahy Scale: Poor Standing balance comment: walker and min assist for static standing once standing established.                             Cognition Arousal/Alertness: Awake/alert Behavior During Therapy: WFL for tasks assessed/performed Overall Cognitive Status: Within Functional Limits for tasks assessed                                        Exercises      General Comments General comments (skin integrity, edema, etc.): VSS      Pertinent Vitals/Pain Pain Assessment: Faces Faces Pain Scale: Hurts even more Pain Location: L groin with movement Pain Descriptors / Indicators: Grimacing;Guarding Pain Intervention(s): Limited activity within patient's tolerance;Monitored during session;Repositioned    Home Living                      Prior Function            PT Goals (current goals can now be found in the care plan section) Acute Rehab PT Goals Patient Stated Goal: Decrease pain Progress towards PT goals: Progressing toward goals    Frequency  Min 2X/week      PT Plan Current plan remains appropriate    Co-evaluation PT/OT/SLP Co-Evaluation/Treatment: Yes Reason for Co-Treatment: For patient/therapist safety;To address functional/ADL transfers PT goals addressed during session: Mobility/safety with mobility OT goals addressed during session: ADL's and self-care      AM-PAC PT "6 Clicks" Daily Activity  Outcome Measure  Difficulty turning over in bed (including adjusting bedclothes, sheets and blankets)?:  Unable Difficulty moving from lying on back to sitting on the side of the bed? : Unable Difficulty sitting down on and standing up from a chair with arms (e.g., wheelchair, bedside commode, etc,.)?: Unable Help needed moving to and from a bed to chair (including a wheelchair)?: A Lot Help needed walking in hospital room?: A Little Help needed climbing 3-5 steps with a railing? : Total 6 Click Score: 9    End of Session Equipment Utilized During Treatment: Gait belt Activity Tolerance: Patient tolerated treatment well Patient left: in bed;with call bell/phone within reach;with nursing/sitter in room Nurse Communication: Mobility status PT Visit Diagnosis: Unsteadiness on feet (R26.81);Other abnormalities of gait and mobility (R26.89);Pain Pain - Right/Left: Left Pain - part of body: Hip     Time: 1010-1020 PT Time Calculation (min) (ACUTE ONLY): 10 min  Charges:  $Gait Training: 8-22 mins $Therapeutic Activity: 8-22 mins                    G Codes:       [facility] PT [place] 11/16/2017, 1:29 PM

## 2017-11-16 NOTE — Progress Notes (Signed)
Clinical Social Worker following patient for support and discharge needs. Patient was at [facility] for several days prior to coming back to [facility]. CSW reached out to admission coordinator at [facility] and spoke with Andee Poles. Danielle stated patient has some barrier to coming back. Andee Poles stated she would like wound vac d/c prior to patient returning because it will allow facility to use a hoyer lift. Andee Poles stated that if wound vac is unable to be discharge then patient will need to be able to get up out of bed with minimal assistance.   CSW gave facility updated PT notes and stated per PT patient is only needing  2 physical assistance;Min assist. Andee Poles stated that she will need a RN note stating that patient is able to have min assist to get out of bed before they can offer a bed back on patient.   CSW contacted patients RN and asked RN to document how patient gets in and out of bed.  Rhea Pink, MSW,  [place]

## 2017-11-16 NOTE — Progress Notes (Signed)
Occupational Therapy Treatment Patient Details Name: [name] MRN: [id number] DOB: 1940/10/11 Today's Date: 11/16/2017    History of present illness 77 y.o. patient who underwent left common femoral endarterectomy and left femoral to below-knee popliteal bypass 6/03, discharged to SNF, returns for follow up with left groin wound infection, debrided and wound vac placed 6/14. PMH includes: foot drop L, DM, peripheral neuropathy, PVD, L food wounds, B RTC repair   OT comments  Pt progressing towards established OT goals and showing increased occupational participation. Pt performing toileting with Min-Mod A +2 for toilet transfer and toilet hygiene. Pt demonstrating increased ROM to bend forward and adjust left sock at EOB. Pt requiring increased time and effort for ADLs and functional mobility. Continue to recommend SNF for further OT and will continue to follow acutely as admitted.     Follow Up Recommendations  SNF;Supervision/Assistance - 24 hour    Equipment Recommendations  (Defer to next venue)    Recommendations for Other Services PT consult    Precautions / Restrictions Precautions Precautions: Fall Precaution Comments: Wound vac       Mobility Bed Mobility Overal bed mobility: Needs Assistance Bed Mobility: Supine to Sit;Sit to Supine     Supine to sit: Mod assist;+2 for safety/equipment Sit to supine: Mod assist   General bed mobility comments: Assist to elevate trunk into sitting, bring legs off of bed,  and bring hips to EOB  Transfers Overall transfer level: Needs assistance Equipment used: Rolling walker (2 wheeled) Transfers: Sit to/from Stand Sit to Stand: +2 physical assistance;Mod assist;Min assist         General transfer comment: Assist to bring hips up and for balance. From bed pt requires +2 mod assist due to difficulty pushing up with arms from soft surface and placing hands on walker to pull up. From [facility] and recliner (with seat built up  with blankets and pillows) pt requires +2 min assist and is able to push up from armrest with UE's. Pt requires verbal cues to bring feet back underneath him prior to standing.    Balance Overall balance assessment: Needs assistance Sitting-balance support: Feet supported;No upper extremity supported Sitting balance-Leahy Scale: Fair     Standing balance support: Bilateral upper extremity supported;During functional activity Standing balance-Leahy Scale: Poor Standing balance comment: walker and min assist for static standing once standing established.                            ADL either performed or assessed with clinical judgement   ADL Overall ADL's : Needs assistance/impaired                     Lower Body Dressing: Maximal assistance;Sit to/from stand Lower Body Dressing Details (indicate cue type and reason): Pt demonstrating increased ROM to bend forward and pull up L sock while sitting at EOB. Pt requiring increased time and effort to adjust sock. Needing Mod-Max A for balance in standing during ADLs.  Toilet Transfer: Minimal assistance;+2 for physical assistance;Stand-pivot;BSC;RW Toilet Transfer Details (indicate cue type and reason): Min A +2 for stand pivot to [facility]. Pt requiring assistance for balance and management of RW. Requiring Min A for safe descent to [facility] and then to separate BLEs. Toileting- Clothing Manipulation and Hygiene: Moderate assistance;+2 for safety/equipment;Cueing for sequencing;Sit to/from stand Toileting - Clothing Manipulation Details (indicate cue type and reason): Pt performing peri care with increase effort and time. Pt reaching back with  right UE and demonstrating difficulty and may benefit from AE for toileting. Pt requiring assistance to make sure pt cleaned.      Functional mobility during ADLs: Moderate assistance;+2 for physical assistance;Rolling walker;Cueing for sequencing General ADL Comments: Pt with decreased balance,  strength, ROM, and acitvity tolerance. Showing progress compared to prior session and demonstrating increased participation with increased encouragement.      Vision       Perception     Praxis      Cognition Arousal/Alertness: Awake/alert Behavior During Therapy: WFL for tasks assessed/performed Overall Cognitive Status: Within Functional Limits for tasks assessed                                          Exercises     Shoulder Instructions       General Comments VSS    Pertinent Vitals/ Pain       Pain Assessment: Faces Faces Pain Scale: Hurts even more Pain Location: L groin with movement Pain Descriptors / Indicators: Grimacing;Guarding Pain Intervention(s): Monitored during session;Limited activity within patient's tolerance;Repositioned  Home Living                                          Prior Functioning/Environment              Frequency  Min 2X/week        Progress Toward Goals  OT Goals(current goals can now be found in the care plan section)  Progress towards OT goals: Progressing toward goals  Acute Rehab OT Goals Patient Stated Goal: Decrease pain OT Goal Formulation: With patient Time For Goal Achievement: 11/22/17 Potential to Achieve Goals: Good ADL Goals Pt Will Perform Grooming: with set-up;with supervision;sitting Pt Will Perform Upper Body Dressing: with set-up;with supervision;sitting Pt Will Perform Lower Body Dressing: sit to/from stand;with min guard assist;with adaptive equipment Pt Will Transfer to Toilet: with min guard assist;ambulating;bedside commode Pt Will Perform Toileting - Clothing Manipulation and hygiene: with min guard assist;sit to/from stand Additional ADL Goal #1: Pt will perform bed mobility with Min Guard A in preparation for ADLs  Plan Discharge plan remains appropriate    Co-evaluation    PT/OT/SLP Co-Evaluation/Treatment: Yes Reason for Co-Treatment: For  patient/therapist safety;To address functional/ADL transfers PT goals addressed during session: Mobility/safety with mobility OT goals addressed during session: ADL's and self-care      AM-PAC PT "6 Clicks" Daily Activity     Outcome Measure   Help from another person eating meals?: None Help from another person taking care of personal grooming?: A Little Help from another person toileting, which includes using toliet, bedpan, or urinal?: A Lot Help from another person bathing (including washing, rinsing, drying)?: A Lot Help from another person to put on and taking off regular upper body clothing?: A Little Help from another person to put on and taking off regular lower body clothing?: A Lot 6 Click Score: 16    End of Session Equipment Utilized During Treatment: Gait belt;Rolling walker  OT Visit Diagnosis: Unsteadiness on feet (R26.81);Other abnormalities of gait and mobility (R26.89);Muscle weakness (generalized) (M62.81);Pain Pain - Right/Left: Left Pain - part of body: Leg   Activity Tolerance Patient tolerated treatment well   Patient Left in chair;with call bell/phone within reach;with nursing/sitter in room   Nurse  Communication Mobility status;Patient requests pain meds        Time: 4098-2867 OT Time Calculation (min): 45 min  Charges: OT General Charges $OT Visit: 1 Visit OT Treatments $Self Care/Home Management : 23-37 mins  [place], OTR/L Acute Rehab Pager: [phone] Office: [place] 11/16/2017, 10:57 AM

## 2017-11-16 NOTE — Progress Notes (Signed)
Physical Therapy Treatment Patient Details Name: [name] MRN: [id number] DOB: 13-Jun-1940 Today's Date: 11/16/2017    History of Present Illness 77 y.o. patient who underwent left common femoral endarterectomy and left femoral to below-knee popliteal bypass 6/03, discharged to SNF, returns for follow up with left groin wound infection, debrided and wound vac placed 6/14. PMH includes: foot drop L, DM, peripheral neuropathy, PVD, L food wounds, B RTC repair    PT Comments    Pt continues to make good progress. Mobilizing better each day.   Follow Up Recommendations  SNF;Supervision/Assistance - 24 hour     Equipment Recommendations  Other (comment)(TBD at SNF)    Recommendations for Other Services       Precautions / Restrictions Precautions Precautions: Fall    Mobility  Bed Mobility Overal bed mobility: Needs Assistance Bed Mobility: Supine to Sit;Sit to Supine     Supine to sit: Mod assist;+2 for safety/equipment Sit to supine: Mod assist   General bed mobility comments: Assist to elevate trunk into sitting, bring legs off of bed,  and bring hips to EOB  Transfers Overall transfer level: Needs assistance Equipment used: Rolling walker (2 wheeled) Transfers: Sit to/from Stand Sit to Stand: +2 physical assistance;Mod assist;Min assist         General transfer comment: Assist to bring hips up and for balance. From bed pt requires +2 mod assist due to difficulty pushing up with arms from soft surface and placing hands on walker to pull up. From [facility] and recliner (with seat built up with blankets and pillows) pt requires +2 min assist and is able to push up from armrest with UE's. Pt requires verbal cues to bring feet back underneath him prior to standing.  Ambulation/Gait Ambulation/Gait assistance: Min assist;+2 safety/equipment Gait Distance (Feet): 35 Feet(35' x 1, 12' x 1) Assistive device: Rolling walker (2 wheeled) Gait Pattern/deviations: Decreased step  length - right;Decreased step length - left;Trunk flexed;Step-through pattern Gait velocity: decr Gait velocity interpretation: <1.31 ft/sec, indicative of household ambulator General Gait Details: Assist for balance and support. Verbal cues to stand more erect and look up   Stairs             Wheelchair Mobility    Modified Rankin (Stroke Patients Only)       Balance Overall balance assessment: Needs assistance Sitting-balance support: Feet supported;No upper extremity supported Sitting balance-Leahy Scale: Fair     Standing balance support: Bilateral upper extremity supported;During functional activity Standing balance-Leahy Scale: Poor Standing balance comment: walker and min assist for static standing once standing established.                             Cognition Arousal/Alertness: Awake/alert Behavior During Therapy: WFL for tasks assessed/performed Overall Cognitive Status: Within Functional Limits for tasks assessed                                        Exercises      General Comments        Pertinent Vitals/Pain Pain Assessment: Faces Faces Pain Scale: Hurts even more Pain Location: L groin with movement Pain Descriptors / Indicators: Grimacing;Guarding Pain Intervention(s): Limited activity within patient's tolerance;Monitored during session;Repositioned    Home Living  Prior Function            PT Goals (current goals can now be found in the care plan section) Progress towards PT goals: Progressing toward goals    Frequency    Min 2X/week      PT Plan Current plan remains appropriate    Co-evaluation PT/OT/SLP Co-Evaluation/Treatment: Yes Reason for Co-Treatment: For patient/therapist safety PT goals addressed during session: Mobility/safety with mobility        AM-PAC PT "6 Clicks" Daily Activity  Outcome Measure  Difficulty turning over in bed (including adjusting  bedclothes, sheets and blankets)?: Unable Difficulty moving from lying on back to sitting on the side of the bed? : A Lot Difficulty sitting down on and standing up from a chair with arms (e.g., wheelchair, bedside commode, etc,.)?: Unable Help needed moving to and from a bed to chair (including a wheelchair)?: A Lot Help needed walking in hospital room?: A Little Help needed climbing 3-5 steps with a railing? : Total 6 Click Score: 10    End of Session Equipment Utilized During Treatment: Gait belt Activity Tolerance: Patient tolerated treatment well Patient left: with call bell/phone within reach;in chair Nurse Communication: Mobility status PT Visit Diagnosis: Unsteadiness on feet (R26.81);Other abnormalities of gait and mobility (R26.89);Pain Pain - Right/Left: Left Pain - part of body: Hip     Time: 6579-0383 PT Time Calculation (min) (ACUTE ONLY): 50 min  Charges:  $Gait Training: 8-22 mins                    G Codes:       [facility] PT [place] 11/16/2017, 10:33 AM

## 2017-11-16 NOTE — Care Management Important Message (Signed)
Important Message  Patient Details  Name: [name] MRN: [id number] Date of Birth: 1940/08/04   Medicare Important Message Given:  Yes    [name] Bun 11/16/2017, 2:44 PM

## 2017-11-17 DIAGNOSIS — I5033 Acute on chronic diastolic (congestive) heart failure: Secondary | ICD-10-CM

## 2017-11-17 LAB — GLUCOSE, CAPILLARY
GLUCOSE-CAPILLARY: 111 mg/dL — AB (ref 70–99)
GLUCOSE-CAPILLARY: 166 mg/dL — AB (ref 70–99)
GLUCOSE-CAPILLARY: 120 mg/dL — AB (ref 70–99)
Glucose-Capillary: 180 mg/dL — ABNORMAL HIGH (ref 70–99)

## 2017-11-17 LAB — BASIC METABOLIC PANEL
Anion gap: 7 (ref 5–15)
BUN: 27 mg/dL — AB (ref 8–23)
CHLORIDE: 99 mmol/L (ref 98–111)
CO2: 31 mmol/L (ref 22–32)
CREATININE: 1.39 mg/dL — AB (ref 0.61–1.24)
Calcium: 8.5 mg/dL — ABNORMAL LOW (ref 8.9–10.3)
GFR, EST AFRICAN AMERICAN: 54 mL/min — AB (ref >60)
GFR, EST NON AFRICAN AMERICAN: 47 mL/min — AB (ref >60)
Glucose, Bld: 122 mg/dL — ABNORMAL HIGH (ref 70–99)
POTASSIUM: 4.2 mmol/L (ref 3.5–5.1)
SODIUM: 137 mmol/L (ref 135–145)

## 2017-11-17 LAB — CBC
HCT: 30.9 % — ABNORMAL LOW (ref 39.0–52.0)
HEMOGLOBIN: 9.6 g/dL — AB (ref 13.0–17.0)
MCH: 30.2 pg (ref 26.0–34.0)
MCHC: 31.1 g/dL (ref 30.0–36.0)
MCV: 97.2 fL (ref 78.0–100.0)
PLATELETS: 246 10*3/uL (ref 150–400)
RBC: 3.18 MIL/uL — AB (ref 4.22–5.81)
RDW: 18.6 % — ABNORMAL HIGH (ref 11.5–15.5)
WBC: 5.4 10*3/uL (ref 4.0–10.5)

## 2017-11-17 MED ORDER — CEPHALEXIN 500 MG PO CAPS
500.0000 mg | ORAL_CAPSULE | Freq: Two times a day (BID) | ORAL | Status: DC
  Administered 2017-11-17 – 2017-12-03 (×33): 500 mg via ORAL
  Filled 2017-11-17 (×33): qty 1

## 2017-11-17 NOTE — Consult Note (Signed)
   [facility] Inpatient Consult   11/17/2017  [name] 1940-09-18 [id number]    Patient screened for potential [facility] Care Management due to increased unplanned risk of readmission score of 32% (extreme).  Spoke with patient and wife at bedside about [place] Management program in detail. The [place] indicate the discharge plan is for SNF- [facility].   [name] wanted to look over the [place] Management literature and information first before giving consent.   Discussed that writer will follow back up at later time regarding [place] Management engagement.   Spoke with inpatient RNCM and inpatient LCSW to make aware of bedside visit.    Marthenia Rolling, MSN-Ed, RN,BSN [facility] Liaison [phone]

## 2017-11-17 NOTE — Consult Note (Signed)
[place] Nurse wound consult note Assessment performed in [place] in the presence of the primary RN, Estill Bamberg, the patient's daughter and spouse. Reason for Consult: Change the VAC dressing Wound type: Surgical debridement site Measurement: Approximately 4 cm x 3 cm x 4.5 cm with tunneling at 12 o'clock approximately 6 cm. Wound bed: 100% clean, pink, granulation tissue Drainage (amount, consistency, odor) When the VAC dressing was removed the wound cavity immediately filled up with serous fluid, and it continued to seep out.  Dr. Donnetta Hutching came to the room to help in evaluating the wound and constructing a plan of care. Periwound: Superior margin is maroon in color and macerated. Dressing procedure/placement/frequency: Lightly pack dry gauze into the wound bed and tunnel, cover with dry gauze; tape in place.  Change daily.  All questions of patient, his daughter and spouse were answered to their expressed satisfaction. Monitor the wound area(s) for worsening of condition such as: Signs/symptoms of infection,  Increase in size,  Development of or worsening of odor, Development of pain, or increased pain at the affected locations.  Notify the medical team if any of these develop.  Thank you for the consult.  Discussed plan of care with the patient and bedside nurse.  [place] nurse will not follow at this time.  Please re-consult the [place] team if needed.  Val Riles, RN, MSN, CWOCN, CNS-BC, pager [phone]

## 2017-11-17 NOTE — Progress Notes (Addendum)
  Progress Note    11/17/2017 7:59 AM 7 Days Post-Op  Subjective:  Says he walked 54' yesterday and has a goal of 43' today.  Afebrile HR 50's-60's NSR 606'T-016'W systolic 10% RA  Vitals:   11/17/17 0013 11/17/17 0322  BP: (!) 127/43 (!) 122/45  Pulse: 61 (!) 57  Resp: 20   Temp: 98 F (36.7 C) 98.5 F (36.9 C)  SpO2: 98% 95%    Physical Exam: Cardiac:  regular Lungs:  Non labored Incisions:  LLE incisions with staples in tact-healing nicely; vac with good seal Extremities:  Easily palpable left DP pulse; still with swelling in LLE but better    CBC    Component Value Date/Time   WBC 5.4 11/17/2017 0512   RBC 3.18 (L) 11/17/2017 0512   HGB 9.6 (L) 11/17/2017 0512   HCT 30.9 (L) 11/17/2017 0512   PLT 246 11/17/2017 0512   MCV 97.2 11/17/2017 0512   MCH 30.2 11/17/2017 0512   MCHC 31.1 11/17/2017 0512   RDW 18.6 (H) 11/17/2017 0512   LYMPHSABS 0.7 10/28/2017 2219   MONOABS 0.8 10/28/2017 2219   EOSABS 0.1 10/28/2017 2219   BASOSABS 0.0 10/28/2017 2219    BMET    Component Value Date/Time   NA 137 11/17/2017 0512   K 4.2 11/17/2017 0512   CL 99 11/17/2017 0512   CO2 31 11/17/2017 0512   GLUCOSE 122 (H) 11/17/2017 0512   BUN 27 (H) 11/17/2017 0512   CREATININE 1.39 (H) 11/17/2017 0512   CREATININE 1.32 04/18/2012 0949   CALCIUM 8.5 (L) 11/17/2017 0512   GFRNONAA 47 (L) 11/17/2017 0512   GFRAA 54 (L) 11/17/2017 0512    INR    Component Value Date/Time   INR 1.27 11/10/2017 0336     Intake/Output Summary (Last 24 hours) at 11/17/2017 0759 Last data filed at 11/17/2017 0711 Gross per 24 hour  Intake -  Output 2375 ml  Net -2375 ml     Assessment:  77 y.o. male is s/p:  Groin debridement with wound vac  7 Days Post-Op  Plan: -wound vac change today by RN -continue to mobilize-he has made good progress this week  -needs to elevate legs when not oob -DVT prophylaxis:  Sq heparin -anticipate dc tomorrow or Friday-would appreciate med recs  from [facility] for discharge   Leontine Locket, PA-C [facility] [phone] 11/17/2017 7:59 AM  I have examined the patient, reviewed and agree with above.  Present during wound VAC change today.  Significant amount of serous fluid present no pus.  The tract does undermine somewhat up to the inguinal ligament but no graft exposed on digital exam.  Do not feel that the [facility] is appropriate with this in the defect and would be better with the packing.  Discussed with the wound nurse present as well.  Also discussed with the patient's daughter present and wife.  Curt Jews, MD 11/17/2017 2:43 PM

## 2017-11-17 NOTE — Progress Notes (Signed)
Physical Therapy Treatment Patient Details Name: [name] MRN: [id number] DOB: 1940-10-06 Today's Date: 11/17/2017    History of Present Illness 77 y.o. patient who underwent left common femoral endarterectomy and left femoral to below-knee popliteal bypass 6/03, discharged to SNF, returns for follow up with left groin wound infection, debrided and wound vac placed 6/14. PMH includes: foot drop L, DM, peripheral neuropathy, PVD, L food wounds, B RTC repair    PT Comments    Pt continues to make steady progress with mobility. Continue to recommend ST-SNF.    Follow Up Recommendations  SNF;Supervision/Assistance - 24 hour     Equipment Recommendations  Other (comment)(TBD at SNF)    Recommendations for Other Services       Precautions / Restrictions Precautions Precautions: Fall    Mobility  Bed Mobility Overal bed mobility: Needs Assistance Bed Mobility: Supine to Sit;Sit to Supine     Supine to sit: Mod assist;+2 for safety/equipment Sit to supine: Mod assist   General bed mobility comments: Assist to elevate trunk into sitting. Assist to bring legs back up into bed.  Transfers Overall transfer level: Needs assistance Equipment used: Rolling walker (2 wheeled) Transfers: Sit to/from [facility] Sit to Stand: +2 physical assistance;Mod assist Stand pivot transfers: +2 physical assistance;Min assist       General transfer comment: Assist to bring hips up and for balance. Vverbal cues to bring feet back underneath him prior to standing.  Ambulation/Gait Ambulation/Gait assistance: Min assist;+2 safety/equipment Gait Distance (Feet): 75 Feet(1 sitting rest break) Assistive device: Rolling walker (2 wheeled) Gait Pattern/deviations: Decreased step length - right;Decreased step length - left;Trunk flexed;Step-through pattern;Decreased dorsiflexion - left Gait velocity: decr Gait velocity interpretation: <1.31 ft/sec, indicative of household  ambulator General Gait Details: Assist for balance and support. Verbal cues to stand more erect and look up   Stairs             Wheelchair Mobility    Modified Rankin (Stroke Patients Only)       Balance Overall balance assessment: Needs assistance Sitting-balance support: Feet supported;No upper extremity supported Sitting balance-Leahy Scale: Fair     Standing balance support: Bilateral upper extremity supported;During functional activity Standing balance-Leahy Scale: Poor Standing balance comment: walker and min guard assist for static standing once standing established.                             Cognition Arousal/Alertness: Awake/alert Behavior During Therapy: WFL for tasks assessed/performed Overall Cognitive Status: Within Functional Limits for tasks assessed                                        Exercises      General Comments        Pertinent Vitals/Pain Pain Assessment: Faces Faces Pain Scale: Hurts even more Pain Location: L groin with movement Pain Descriptors / Indicators: Grimacing;Guarding Pain Intervention(s): Limited activity within patient's tolerance;Monitored during session;Repositioned    Home Living                      Prior Function            PT Goals (current goals can now be found in the care plan section) Progress towards PT goals: Progressing toward goals;Goals met and updated - see care plan    Frequency  Min 2X/week      PT Plan Current plan remains appropriate    Co-evaluation PT/OT/SLP Co-Evaluation/Treatment: Yes            AM-PAC PT "6 Clicks" Daily Activity  Outcome Measure  Difficulty turning over in bed (including adjusting bedclothes, sheets and blankets)?: Unable Difficulty moving from lying on back to sitting on the side of the bed? : Unable Difficulty sitting down on and standing up from a chair with arms (e.g., wheelchair, bedside commode, etc,.)?:  Unable Help needed moving to and from a bed to chair (including a wheelchair)?: A Lot Help needed walking in hospital room?: A Little Help needed climbing 3-5 steps with a railing? : Total 6 Click Score: 9    End of Session Equipment Utilized During Treatment: Gait belt Activity Tolerance: Patient tolerated treatment well Patient left: with call bell/phone within reach;in bed Nurse Communication: Mobility status PT Visit Diagnosis: Unsteadiness on feet (R26.81);Other abnormalities of gait and mobility (R26.89);Pain Pain - Right/Left: Left Pain - part of body: Hip     Time: 5170-0174 PT Time Calculation (min) (ACUTE ONLY): 44 min  Charges:  $Gait Training: 38-52 mins                    G Codes:       [facility] PT [place] 11/17/2017, 12:54 PM

## 2017-11-17 NOTE — Care Management Note (Signed)
Case Management Note Marvetta Gibbons RN, BSN Unit 4E-Case Manager [phone]  Patient Details  Name: GREYSYN VANDERBERG MRN: [id number] Date of Birth: 1940-11-28  Subjective/Objective:  Pt readmitted with poorly healing left groin IV abx started on admission, s/p debridement and wound vAC placement on 6/14               Action/Plan: PTA pt had transitioned to STSNF after recent surgery. Prior to that home with wife. CSW has been following for return to [facility] when medically cleared for discharge. Have been notified by dept. Director that pt's daughter would like to meet with CM and CSW at the bedside tomorrow to discuss transition plan. CM will plan to meet with pt and family along with CSW on 6/26.   Expected Discharge Date:                  Expected Discharge Plan:  Skilled Nursing Facility  In-House Referral:  Clinical Social Work  Discharge planning Services  CM Consult  Post Acute Care Choice:    Choice offered to:     DME Arranged:    DME Agency:     HH Arranged:    [place] Agency:     Status of Service:  In process, will continue to follow  If discussed at Long Length of Stay Meetings, dates discussed:    Discharge Disposition: skilled facility  Additional Comments:  11/17/17- 1430- Marvetta Gibbons RN, CM- met with pt, wife and daughter along with CSW-Ashley at the bedside to discuss transition of care plan and needs- questions and concerns addressed by both CM and CSW for transition back to [facility]- daughter would like early transfer/discharge if pt is discharged on Friday of this week so that pt can be settled into [facility] earlier in the day. CSW will f/u on pending auth for [facility] and transition to STSNF when medically cleared by medical team.  CM will follow at a distance for any f/u questions or concerns. Daughter also shared that she is meeting with [employer] for additional assistance at [place] while family is on vacation next week to support pt.   Dawayne Patricia, RN 11/17/2017, 4:57 PM

## 2017-11-17 NOTE — Progress Notes (Signed)
PROGRESS NOTE                                                                                                                                                                                                             Patient Demographics:    [name], is a 77 y.o. male, DOB - 1941/02/11, ZOX:[id number]  Admit date - 11/04/2017   Admitting Physician Elam Dutch, MD  Outpatient Primary MD for the patient is Lavone Orn, MD  LOS - 13  Outpatient Specialists:  No chief complaint on file.      Brief Narrative  Medical consult for 77 year old male with peripheral vascular disease, hypertension, chronic kidney disease stage III, hyperlipidemia, diabetes mellitus type 2, hypothyroidism, carotid artery stenosis status post CEA and remote history of aortofemoral bypass who had a recent left popliteal femoral bypass due to a nonhealing ulcer on his left great toe.  Postop course complicated by left groin wound infection with pansensitive E. coli and Klebsiella pneumonia. Patient underwent debridement and placement of a wound VAC in the OR and started on IV antibiotics. Hospital is following for management of his diabetes and hypertension.    Subjective:   Patient reports feeling better.  Was able to walk 75 feet with PT today.   Assessment  & Plan :   Diabetes mellitus type 2, controlled A1c of 6.2.  Stable on sliding scale coverage.  Continue Actos and resume upon discharge.  Acute on chronic diastolic CHF Mild symptoms.  Does have  leg swellings.  Continue strict I's/O and daily weight.   negative balance of 5.6 L since admission.  Patient was on PRN daily Lasix and would benefit from being on scheduled dose (40 mg daily) Upon discharge would continue scheduled Lasix, aspirin, statin, metoprolol and ramipril.  Essential hypertension Blood pressure stable.  Continue Lasix, metoprolol and ramipril.  Urinary  retention Secondary to BPH.  Required Foley catheter.  Urology was consulted.  UA negative for infection.  Patient sees Dr. Beatrix Fetters office.  Dr. Jeffie Pollock recommended Flomax and voiding trial.  Recommend voiding trial in a.m. and if unsuccessful patient may need to be discharged with Foley and outpatient follow-up in urology office in the next 1-2 weeks.  Stage III chronic kidney disease Creatinine stable.  Left groin wound infection On empiric cefazolin.  Wound culture from the groin  growing Klebsiella and E. coli which was sensitive to cephalosporins.  Duration of antibiotic per primary.   No change in rest of his medications.  We will sign off.  Please call for any questions.  Thank you for allowing [place] to participate in patient's care.  Code Status : Full code  Family Communication  : None at bedside  Disposition Plan  : Per primary.  PT recommends SNF.  Discussed with primary team on the phone Aldona [place], [place])  DVT Prophylaxis  :  Lovenox -  Lab Results  Component Value Date   PLT 246 11/17/2017    Antibiotics  :    Anti-infectives (From admission, onward)   Start     Dose/Rate Route Frequency Ordered Stop   11/17/17 1100  cephALEXin (KEFLEX) capsule 500 mg     500 mg Oral Every 12 hours 11/17/17 1042     11/15/17 2000  ciprofloxacin (CIPRO) tablet 500 mg  Status:  Discontinued     500 mg Oral 2 times daily 11/15/17 1716 11/17/17 1042   11/11/17 0845  ceFAZolin (ANCEF) IVPB 1 g/50 mL premix  Status:  Discontinued     1 g 100 mL/hr over 30 Minutes Intravenous Every 8 hours 11/11/17 0831 11/15/17 1716   11/05/17 2200  vancomycin (VANCOCIN) 1,250 mg in sodium chloride 0.9 % 250 mL IVPB  Status:  Discontinued     1,250 mg 166.7 mL/hr over 90 Minutes Intravenous Every 24 hours 11/04/17 1851 11/06/17 1514   11/04/17 2200  piperacillin-tazobactam (ZOSYN) IVPB 3.375 g  Status:  Discontinued     3.375 g 12.5 mL/hr over 240 Minutes Intravenous Every 8 hours 11/04/17 1851  11/11/17 0830   11/04/17 1900  vancomycin (VANCOCIN) 2,000 mg in sodium chloride 0.9 % 500 mL IVPB     2,000 mg 250 mL/hr over 120 Minutes Intravenous  Once 11/04/17 1851 11/05/17 0009        Objective:   Vitals:   11/16/17 2210 11/17/17 0013 11/17/17 0322 11/17/17 0804  BP: (!) 119/38 (!) 127/43 (!) 122/45 (!) 132/45  Pulse: 65 61 (!) 57 64  Resp:  20  20  Temp:  98 F (36.7 C) 98.5 F (36.9 C) 98.1 F (36.7 C)  TempSrc:  Oral Oral Oral  SpO2:  98% 95% (!) 85%  Weight:      Height:        Wt Readings from Last 3 Encounters:  11/05/17 123.4 kg (272 lb)  11/04/17 113.9 kg (251 lb)  11/02/17 126.6 kg (279 lb 1.6 oz)     Intake/Output Summary (Last 24 hours) at 11/17/2017 1133 Last data filed at 11/17/2017 0711 Gross per 24 hour  Intake -  Output 2375 ml  Net -2375 ml     Physical Exam  Gen: not in distress HEENT: no pallor, moist mucosa, supple neck Chest: clear b/l, no added sounds CVS: N S1&S2, no murmurs,  GI: soft, NT, ND, BS+, foley+ Musculoskeletal: warm, 1 +pitting edema b/l     Data Review:    CBC Recent Labs  Lab 11/13/17 0656 11/14/17 0902 11/15/17 0342 11/16/17 0503 11/17/17 0512  WBC 5.8 13.5* 6.1 5.6 5.4  HGB 9.5* 11.6* 9.9* 10.1* 9.6*  HCT 30.3* 36.4* 31.4* 33.1* 30.9*  PLT 324 228 289 268 246  MCV 96.5 102.0* 96.9 98.2 97.2  MCH 30.3 32.5 30.6 30.0 30.2  MCHC 31.4 31.9 31.5 30.5 31.1  RDW 18.1* 14.1 18.4* 18.6* 18.6*    Chemistries  Recent Labs  Lab 11/13/17 0656 11/14/17 0902 11/15/17 0342 11/16/17 0503 11/17/17 0512  NA 131* 136 136 134* 137  K 3.7 4.5 3.4* 3.9 4.2  CL 98* 106 100* 100 99  CO2 26 23 28 27 31   GLUCOSE 129* 155* 150* 127* 122*  BUN 34* 56* 31* 30* 27*  CREATININE 1.55* 1.33* 1.26* 1.30* 1.39*  CALCIUM 8.2* 8.6* 8.3* 8.3* 8.5*   ------------------------------------------------------------------------------------------------------------------ No results for input(s): CHOL, HDL, LDLCALC, TRIG, CHOLHDL,  LDLDIRECT in the last 72 hours.  Lab Results  Component Value Date   HGBA1C 6.2 (H) 10/21/2017   ------------------------------------------------------------------------------------------------------------------ No results for input(s): TSH, T4TOTAL, T3FREE, THYROIDAB in the last 72 hours.  Invalid input(s): FREET3 ------------------------------------------------------------------------------------------------------------------ No results for input(s): VITAMINB12, FOLATE, FERRITIN, TIBC, IRON, RETICCTPCT in the last 72 hours.  Coagulation profile No results for input(s): INR, PROTIME in the last 168 hours.  No results for input(s): DDIMER in the last 72 hours.  Cardiac Enzymes No results for input(s): CKMB, TROPONINI, MYOGLOBIN in the last 168 hours.  Invalid input(s): CK ------------------------------------------------------------------------------------------------------------------ No results found for: BNP  Inpatient Medications  Scheduled Meds: . aspirin EC  325 mg Oral q1800  . atorvastatin  20 mg Oral QPM  . cephALEXin  500 mg Oral Q12H  . feeding supplement (PRO-STAT SUGAR FREE 64)  30 mL Oral BID  . fenofibrate  54 mg Oral Daily  . furosemide  40 mg Oral Daily  . gabapentin  300 mg Oral QHS  . gabapentin  600 mg Oral Daily  . heparin  5,000 Units Subcutaneous Q8H  . insulin aspart  0-15 Units Subcutaneous TID WC  . levothyroxine  50 mcg Oral QAC breakfast  . loratadine  10 mg Oral Daily  . metoprolol tartrate  50 mg Oral BID  . multivitamin with minerals  1 tablet Oral Daily  . pantoprazole  40 mg Oral Daily  . pioglitazone  30 mg Oral Q lunch  . ramipril  10 mg Oral Daily  . tamsulosin  0.4 mg Oral Daily   Continuous Infusions: . sodium chloride 10 mL/hr at 11/15/17 1649   PRN Meds:.sodium chloride, acetaminophen **OR** acetaminophen, alum & mag hydroxide-simeth, dextromethorphan-guaiFENesin, docusate sodium, hydrALAZINE, HYDROcodone-acetaminophen,  HYDROmorphone (DILAUDID) injection, labetalol, metoprolol tartrate, nitroGLYCERIN, ondansetron, oxymetazoline, phenol, polyethylene glycol, polyvinyl alcohol, senna-docusate  Micro Results Recent Results (from the past 240 hour(s))  Aerobic Culture (superficial specimen)     Status: None   Collection Time: 11/10/17  9:53 AM  Result Value Ref Range Status   Specimen Description WOUND LEFT GROIN  Final   Special Requests NONE  Final   Gram Stain   Final    NO WBC SEEN NO ORGANISMS SEEN Performed at [facility], [address], [place], [place] [address]    Culture   Final    RARE KLEBSIELLA PNEUMONIAE RARE ESCHERICHIA COLI    Report Status 11/12/2017 FINAL  Final   Organism ID, Bacteria KLEBSIELLA PNEUMONIAE  Final   Organism ID, Bacteria ESCHERICHIA COLI  Final      Susceptibility   Escherichia coli - MIC*    AMPICILLIN 8 SENSITIVE Sensitive     CEFAZOLIN <=4 SENSITIVE Sensitive     CEFEPIME <=1 SENSITIVE Sensitive     CEFTAZIDIME <=1 SENSITIVE Sensitive     CEFTRIAXONE <=1 SENSITIVE Sensitive     CIPROFLOXACIN <=0.25 SENSITIVE Sensitive     GENTAMICIN <=1 SENSITIVE Sensitive     IMIPENEM <=0.25 SENSITIVE Sensitive     TRIMETH/SULFA <=20 SENSITIVE Sensitive  AMPICILLIN/SULBACTAM <=2 SENSITIVE Sensitive     PIP/TAZO <=4 SENSITIVE Sensitive     Extended ESBL NEGATIVE Sensitive     * RARE ESCHERICHIA COLI   Klebsiella pneumoniae - MIC*    AMPICILLIN >=32 RESISTANT Resistant     CEFAZOLIN <=4 SENSITIVE Sensitive     CEFEPIME <=1 SENSITIVE Sensitive     CEFTAZIDIME <=1 SENSITIVE Sensitive     CEFTRIAXONE <=1 SENSITIVE Sensitive     CIPROFLOXACIN <=0.25 SENSITIVE Sensitive     GENTAMICIN <=1 SENSITIVE Sensitive     IMIPENEM <=0.25 SENSITIVE Sensitive     TRIMETH/SULFA <=20 SENSITIVE Sensitive     AMPICILLIN/SULBACTAM 4 SENSITIVE Sensitive     PIP/TAZO <=4 SENSITIVE Sensitive     Extended ESBL NEGATIVE Sensitive     * RARE KLEBSIELLA PNEUMONIAE    Radiology  Reports No results found.  Time Spent in minutes  25   Mardell Suttles M.D on 11/17/2017 at 11:33 AM  Between 7am to 7pm - Pager - [phone]  After 7pm go to [email or web address] - password [email or web address]  [facility] -  Office  [phone]

## 2017-11-18 LAB — CBC
HEMATOCRIT: 32.9 % — AB (ref 39.0–52.0)
HEMOGLOBIN: 10 g/dL — AB (ref 13.0–17.0)
MCH: 29.9 pg (ref 26.0–34.0)
MCHC: 30.4 g/dL (ref 30.0–36.0)
MCV: 98.5 fL (ref 78.0–100.0)
Platelets: 250 10*3/uL (ref 150–400)
RBC: 3.34 MIL/uL — AB (ref 4.22–5.81)
RDW: 18.7 % — ABNORMAL HIGH (ref 11.5–15.5)
WBC: 5.4 10*3/uL (ref 4.0–10.5)

## 2017-11-18 LAB — BASIC METABOLIC PANEL
Anion gap: 7 (ref 5–15)
BUN: 31 mg/dL — AB (ref 8–23)
CHLORIDE: 99 mmol/L (ref 98–111)
CO2: 30 mmol/L (ref 22–32)
Calcium: 8.4 mg/dL — ABNORMAL LOW (ref 8.9–10.3)
Creatinine, Ser: 1.34 mg/dL — ABNORMAL HIGH (ref 0.61–1.24)
GFR calc Af Amer: 57 mL/min — ABNORMAL LOW (ref >60)
GFR calc non Af Amer: 49 mL/min — ABNORMAL LOW (ref >60)
Glucose, Bld: 137 mg/dL — ABNORMAL HIGH (ref 70–99)
POTASSIUM: 4.5 mmol/L (ref 3.5–5.1)
SODIUM: 136 mmol/L (ref 135–145)

## 2017-11-18 LAB — GLUCOSE, CAPILLARY
GLUCOSE-CAPILLARY: 133 mg/dL — AB (ref 70–99)
GLUCOSE-CAPILLARY: 155 mg/dL — AB (ref 70–99)
Glucose-Capillary: 167 mg/dL — ABNORMAL HIGH (ref 70–99)
Glucose-Capillary: 164 mg/dL — ABNORMAL HIGH (ref 70–99)

## 2017-11-18 NOTE — Plan of Care (Signed)
  Problem: Education: Goal: Knowledge of General Education information will improve Outcome: Progressing   Problem: Clinical Measurements: Goal: Will remain free from infection Outcome: Progressing   Problem: Pain Managment: Goal: General experience of comfort will improve Outcome: Progressing

## 2017-11-18 NOTE — Progress Notes (Signed)
Occupational Therapy Treatment Patient Details Name: [name] MRN: [id number] DOB: Aug 18, 1940 Today's Date: 11/18/2017    History of present illness 77 y.o. patient who underwent left common femoral endarterectomy and left femoral to below-knee popliteal bypass 6/03, discharged to SNF, returns for follow up with left groin wound infection, debrided and wound vac placed 6/14. PMH includes: foot drop L, DM, peripheral neuropathy, PVD, L food wounds, B RTC repair   OT comments  Pt tolerated functional mobility in room with mod assist +2 for safety; able to stand at sink to complete grooming task with min assist for washing hands, pt does fatigue quickly and unable to stand at sink without UE support. D/c plan remains appropriate. Will continue to follow acutely.   Follow Up Recommendations  SNF;Supervision/Assistance - 24 hour    Equipment Recommendations  Other (comment)(TBD at next venue)    Recommendations for Other Services      Precautions / Restrictions Precautions Precautions: Fall Restrictions Weight Bearing Restrictions: Yes LLE Weight Bearing: Weight bearing as tolerated       Mobility Bed Mobility Overal bed mobility: Needs Assistance Bed Mobility: Supine to Sit     Supine to sit: Mod assist     General bed mobility comments: Cues throughout for sequencing and technique. Heavy use of bed rails with HOB flat. Assist for trunk elevation to sitting.  Transfers Overall transfer level: Needs assistance Equipment used: Rolling walker (2 wheeled) Transfers: Sit to/from Stand Sit to Stand: Max assist;+2 physical assistance;From elevated surface         General transfer comment: Max assist +2 to boost up from EOB. Pt with bil UEs on RW and pulling up despite cues for safe hand placement. Bed elevated.     Balance Overall balance assessment: Needs assistance Sitting-balance support: Feet supported Sitting balance-Leahy Scale: Good     Standing balance  support: Bilateral upper extremity supported Standing balance-Leahy Scale: Poor Standing balance comment: Unable to stand at sink unsupported                           ADL either performed or assessed with clinical judgement   ADL Overall ADL's : Needs assistance/impaired Eating/Feeding: Set up;Sitting Eating/Feeding Details (indicate cue type and reason): pt set up with lunch at end of session Grooming: Minimal assistance;Standing;Wash/dry hands;Cueing for sequencing Grooming Details (indicate cue type and reason): pt leaning on forearms on sink throughout             Lower Body Dressing: Moderate assistance Lower Body Dressing Details (indicate cue type and reason): to adjust socks in sitting             Functional mobility during ADLs: Moderate assistance;+2 for safety/equipment;Rolling walker;Cueing for safety;Cueing for sequencing General ADL Comments: VSS throughout     Vision       Perception     Praxis      Cognition Arousal/Alertness: Awake/alert Behavior During Therapy: WFL for tasks assessed/performed Overall Cognitive Status: Impaired/Different from baseline Area of Impairment: Memory;Following commands;Safety/judgement;Problem solving                     Memory: Decreased short-term memory Following Commands: Follows one step commands with increased time Safety/Judgement: Decreased awareness of safety   Problem Solving: Slow processing;Difficulty sequencing;Requires verbal cues General Comments: Increased time to recall birthdate. Cues for sequencing washing hands        Exercises     Shoulder Instructions  General Comments      Pertinent Vitals/ Pain       Pain Assessment: Faces Faces Pain Scale: Hurts even more Pain Location: LLE Pain Descriptors / Indicators: Discomfort;Aching Pain Intervention(s): Monitored during session;Limited activity within patient's tolerance;Repositioned  Home Living                                           Prior Functioning/Environment              Frequency  Min 2X/week        Progress Toward Goals  OT Goals(current goals can now be found in the care plan section)  Progress towards OT goals: Progressing toward goals  Acute Rehab OT Goals Patient Stated Goal: Decrease pain OT Goal Formulation: With patient  Plan Discharge plan remains appropriate    Co-evaluation                 AM-PAC PT "6 Clicks" Daily Activity     Outcome Measure   Help from another person eating meals?: None Help from another person taking care of personal grooming?: A Little Help from another person toileting, which includes using toliet, bedpan, or urinal?: A Lot Help from another person bathing (including washing, rinsing, drying)?: A Lot Help from another person to put on and taking off regular upper body clothing?: A Little Help from another person to put on and taking off regular lower body clothing?: A Lot 6 Click Score: 16    End of Session Equipment Utilized During Treatment: Gait belt;Rolling walker  OT Visit Diagnosis: Unsteadiness on feet (R26.81);Other abnormalities of gait and mobility (R26.89);Muscle weakness (generalized) (M62.81);Pain Pain - Right/Left: Left Pain - part of body: Leg   Activity Tolerance Patient tolerated treatment well   Patient Left in chair;with call bell/phone within reach;with chair alarm set   Nurse Communication          Time: 1561-5379 OT Time Calculation (min): 30 min  Charges: OT General Charges $OT Visit: 1 Visit OT Treatments $Self Care/Home Management : 23-37 mins  Ashkan Chamberland A. Ulice Brilliant, M.S., OTR/L Acute Rehab Department: [phone]   Binnie Kand 11/18/2017, 2:26 PM

## 2017-11-18 NOTE — Progress Notes (Signed)
Patient transferred back to bed after sitting in chair for 120 minutes but not able to ambulate in room stating his foot drop was too bad at the moment and he was afraid he would fall. He states he is tired from being up in chair for lengthy period of time.

## 2017-11-18 NOTE — Progress Notes (Signed)
[facility]  Subjective  - overall a little better   Objective (!) 147/66 (!) 59 97.9 F (36.6 C) (Oral) 19 95%  Intake/Output Summary (Last 24 hours) at 11/18/2017 1006 Last data filed at 11/18/2017 0401 Gross per 24 hour  Intake 0 ml  Output 2726 ml  Net -2726 ml   Left foot dry gangrene slowly healing Leg incisions clean Left groin with some granulation tissue, no graft exposed  Assessment/Planning: S/p fem pop with wound issue left groin.  VAC dc'd yesterday.  Will see how well the drainage is controlled to decide on local wet to dry vs resuming VAC  Will most likely need to still be here several days to assess amount of wound drainage output  Continue antibiotics  Remove every other staple today  Cont PT/OT  Ruta Hinds 11/18/2017 10:06 AM --  Laboratory Lab Results: Recent Labs    11/17/17 0512 11/18/17 0417  WBC 5.4 5.4  HGB 9.6* 10.0*  HCT 30.9* 32.9*  PLT 246 250   BMET Recent Labs    11/17/17 0512 11/18/17 0417  NA 137 136  K 4.2 4.5  CL 99 99  CO2 31 30  GLUCOSE 122* 137*  BUN 27* 31*  CREATININE 1.39* 1.34*  CALCIUM 8.5* 8.4*    COAG Lab Results  Component Value Date   INR 1.27 11/10/2017   INR 1.20 11/04/2017   INR 1.13 10/21/2017   No results found for: PTT

## 2017-11-18 NOTE — Plan of Care (Signed)
  Problem: Education: Goal: Knowledge of General Education information will improve Outcome: Not Progressing   Problem: Health Behavior/Discharge Planning: Goal: Ability to manage health-related needs will improve Outcome: Not Progressing   Problem: Clinical Measurements: Goal: Will remain free from infection Outcome: Not Progressing   Problem: Activity: Goal: Risk for activity intolerance will decrease Outcome: Not Progressing   Problem: Elimination: Goal: Will not experience complications related to urinary retention Outcome: Not Progressing   Problem: Skin Integrity: Goal: Risk for impaired skin integrity will decrease Outcome: Not Progressing

## 2017-11-18 NOTE — Progress Notes (Signed)
Every other staple removed to incisions to left leg. Patient tolerated well with no pain. There were a few small spots with very scant bleeding due to old scabs coming off and telfa loosely taped to area.

## 2017-11-18 NOTE — Progress Notes (Signed)
Nutrition Follow Up  DOCUMENTATION CODES:   Obesity unspecified  INTERVENTION:    Atkins Shakes po TID, each supplement provides 160 kcal and 15 grams of protein   D/C Prosat liquid protein supplements  NUTRITION DIAGNOSIS:   Increased nutrient needs related to wound healing as evidenced by estimated needs, onoging  GOAL:   Patient will meet greater than or equal to 90% of their needs, progressing  MONITOR:   PO intake, Supplement acceptance, Labs, Weight trends  ASSESSMENT:   77 yo male post left common femoral endarterectomy and left femoral to below-knee popliteal bypass with vein 10 days ago admitted with poorly healing left groin. Pt with hx of DM, CAD s/p CABG, HTN, CHF, CKD III, PAD  Pt s/p procedure 6/14: DEBRIDEMENT WOUND LEFT GROIN   Pt sleeping upon RD visit. Spoke with RN and NT. Per NT pt has been eating well. His family has been bringing food from home. He is not taking his Prostat liquid protein supplements.  Palliative Medicine Team note 6/23 reviewed. Labs and medications reviewed. CBG's [id number].  Diet Order:   Diet Order           Diet regular Room service appropriate? Yes; Fluid consistency: Thin  Diet effective now         EDUCATION NEEDS:   Not appropriate for education at this time  Skin:  Skin Assessment: Skin Integrity Issues: Skin Integrity Issues:: Wound VAC, Other (Comment) Wound Vac: groin Other: venous stasis ulcers on foot, toe  Last BM:  6/27  Height:   Ht Readings from Last 1 Encounters:  11/05/17 5\' 11"  (1.803 m)    Weight:   Wt Readings from Last 1 Encounters:  11/18/17 277 lb 5.4 oz (125.8 kg)    Ideal Body Weight:  78.2 kg  BMI:  Body mass index is 38.68 kg/m.  Estimated Nutritional Needs:   Kcal:  2000-2200  Protein:  115-130 gm  Fluid:  >/= 2 L  Arthur Holms, RD, LDN Pager #: [phone] After-Hours Pager #: [phone]

## 2017-11-18 NOTE — Progress Notes (Signed)
CSW confirmed with [facility] that [facility] has given approval for patient to admit to SNF. Authorization is valid for today and tomorrow. If patient does not admit to the facility on 11/19/17, facility will have to obtain new authorization, and [employer] is not open on the weekends.   CSW to follow for medical readiness and will support with discharge planning.  Estanislado Emms, [place]

## 2017-11-19 LAB — GLUCOSE, CAPILLARY
GLUCOSE-CAPILLARY: 130 mg/dL — AB (ref 70–99)
Glucose-Capillary: 124 mg/dL — ABNORMAL HIGH (ref 70–99)
Glucose-Capillary: 153 mg/dL — ABNORMAL HIGH (ref 70–99)
Glucose-Capillary: 124 mg/dL — ABNORMAL HIGH (ref 70–99)

## 2017-11-19 NOTE — Progress Notes (Signed)
[facility] [place]  Subjective  - feels ok   Objective (!) 145/57 61 98 F (36.7 C) (Oral) 18 96%  Intake/Output Summary (Last 24 hours) at 11/19/2017 1529 Last data filed at 11/19/2017 1250 Gross per 24 hour  Intake 240 ml  Output 1470 ml  Net -1230 ml   Left foot 2+ DP pulse Left groin wound granulating but still probably about 200 cc drainage daily estimate Left foot dry gangrene healing slowly  Assessment/Planning: Still with complex high risk left groin wound.  Will continue wet to dry dressing over the weekend possibly switch back to a VAC next week.  He will most likely be in hospital at least until the end of next week.  Urinary retention hold on voiding trial until Monday  Continue antibiotics   Pt daughter updated by phone  Ruta Hinds 11/19/2017 3:29 PM --  Laboratory Lab Results: Recent Labs    11/17/17 0512 11/18/17 0417  WBC 5.4 5.4  HGB 9.6* 10.0*  HCT 30.9* 32.9*  PLT 246 250   BMET Recent Labs    11/17/17 0512 11/18/17 0417  NA 137 136  K 4.2 4.5  CL 99 99  CO2 31 30  GLUCOSE 122* 137*  BUN 27* 31*  CREATININE 1.39* 1.34*  CALCIUM 8.5* 8.4*    COAG Lab Results  Component Value Date   INR 1.27 11/10/2017   INR 1.20 11/04/2017   INR 1.13 10/21/2017   No results found for: PTT

## 2017-11-19 NOTE — Progress Notes (Signed)
Per MD, patient not yet medically ready. CSW updated admissions at [facility]. [facility] will need to obtain new insurance authorization with [facility] when patient is ready.  CSW also spoke to patient's wife on the phone and made her aware patient will not discharge today. Most of patient's family will be out of town next week, though wife indicated patient's daughter will stay in town so she can be available when patient discharges.   CSW to follow and support with disposition planning.  Estanislado Emms, [place]

## 2017-11-19 NOTE — Care Management Important Message (Signed)
Important Message  Patient Details  Name: [name] MRN: [id number] Date of Birth: Sep 16, 1940   Medicare Important Message Given:  Yes    Kaely Hollan P Neel Buffone 11/19/2017, 3:24 PM

## 2017-11-19 NOTE — Progress Notes (Signed)
Physical Therapy Treatment Patient Details Name: [name] MRN: [id number] DOB: January 22, 1941 Today's Date: 11/19/2017    History of Present Illness 77 y.o. patient who underwent left common femoral endarterectomy and left femoral to below-knee popliteal bypass 6/03, discharged to SNF, returns for follow up with left groin wound infection, debrided and wound vac placed 6/14. PMH includes: foot drop L, DM, peripheral neuropathy, PVD, L food wounds, B RTC repair    PT Comments    Pt continuing to make steady progress with mobility.   Follow Up Recommendations  SNF;Supervision/Assistance - 24 hour     Equipment Recommendations  Other (comment)(TBD at SNF)    Recommendations for Other Services       Precautions / Restrictions Precautions Precautions: Fall    Mobility  Bed Mobility Overal bed mobility: Needs Assistance Bed Mobility: Supine to Sit     Supine to sit: Mod assist     General bed mobility comments: Assist to bring legs off bed and elevate trunk into sitting  Transfers Overall transfer level: Needs assistance Equipment used: Rolling walker (2 wheeled) Transfers: Sit to/from Stand Sit to Stand: +2 physical assistance;Mod assist;Min assist         General transfer comment: Assist to bring hips up and for balance. Mod assist from bed but min assist from bsc and recliner.   Ambulation/Gait Ambulation/Gait assistance: Min assist;+2 safety/equipment Gait Distance (Feet): 80 Feet(10' to bathroom and 59' after bathroom) Assistive device: Rolling walker (2 wheeled) Gait Pattern/deviations: Decreased step length - right;Decreased step length - left;Trunk flexed;Step-through pattern;Decreased dorsiflexion - left Gait velocity: decr Gait velocity interpretation: <1.31 ft/sec, indicative of household ambulator General Gait Details: Assist for balance and support. Verbal cues to stand more erect and look up. 1 sitting rest break and 1 standing rest  break   Stairs             Wheelchair Mobility    Modified Rankin (Stroke Patients Only)       Balance Overall balance assessment: Needs assistance Sitting-balance support: Feet supported;No upper extremity supported Sitting balance-Leahy Scale: Fair     Standing balance support: Bilateral upper extremity supported;During functional activity Standing balance-Leahy Scale: Poor Standing balance comment: walker and min guard assist for static standing once standing established.                             Cognition Arousal/Alertness: Awake/alert Behavior During Therapy: WFL for tasks assessed/performed Overall Cognitive Status: Within Functional Limits for tasks assessed                                        Exercises      General Comments        Pertinent Vitals/Pain Pain Assessment: Faces Faces Pain Scale: Hurts little more Pain Location: L groin with movement Pain Descriptors / Indicators: Grimacing;Guarding Pain Intervention(s): Limited activity within patient's tolerance;Monitored during session    Home Living                      Prior Function            PT Goals (current goals can now be found in the care plan section) Acute Rehab PT Goals PT Goal Formulation: With patient Time For Goal Achievement: 12/03/17 Potential to Achieve Goals: Good Progress towards PT goals: Goals met and updated -  see care plan;Progressing toward goals    Frequency    Min 2X/week      PT Plan Current plan remains appropriate    Co-evaluation PT/OT/SLP Co-Evaluation/Treatment: Yes            AM-PAC PT "6 Clicks" Daily Activity  Outcome Measure  Difficulty turning over in bed (including adjusting bedclothes, sheets and blankets)?: Unable Difficulty moving from lying on back to sitting on the side of the bed? : Unable Difficulty sitting down on and standing up from a chair with arms (e.g., wheelchair, bedside  commode, etc,.)?: Unable Help needed moving to and from a bed to chair (including a wheelchair)?: A Lot Help needed walking in hospital room?: A Little Help needed climbing 3-5 steps with a railing? : Total 6 Click Score: 9    End of Session Equipment Utilized During Treatment: Gait belt Activity Tolerance: Patient tolerated treatment well Patient left: in chair Nurse Communication: Mobility status PT Visit Diagnosis: Unsteadiness on feet (R26.81);Other abnormalities of gait and mobility (R26.89);Pain Pain - Right/Left: Left Pain - part of body: Hip     Time: 1508-1600 PT Time Calculation (min) (ACUTE ONLY): 52 min  Charges:  $Gait Training: 23-37 mins $Therapeutic Activity: 8-22 mins                    G Codes:       [facility] PT [place] 11/19/2017, 5:36 PM

## 2017-11-20 ENCOUNTER — Encounter (HOSPITAL_COMMUNITY): Payer: Self-pay | Admitting: Vascular Surgery

## 2017-11-20 LAB — GLUCOSE, CAPILLARY
GLUCOSE-CAPILLARY: 151 mg/dL — AB (ref 70–99)
Glucose-Capillary: 165 mg/dL — ABNORMAL HIGH (ref 70–99)
Glucose-Capillary: 167 mg/dL — ABNORMAL HIGH (ref 70–99)
Glucose-Capillary: 125 mg/dL — ABNORMAL HIGH (ref 70–99)

## 2017-11-20 NOTE — Plan of Care (Signed)
  Problem: Clinical Measurements: Goal: Will remain free from infection Outcome: Progressing   Problem: Coping: Goal: Level of anxiety will decrease Outcome: Progressing   Problem: Elimination: Goal: Will not experience complications related to bowel motility Outcome: Progressing   Problem: Elimination: Goal: Will not experience complications related to urinary retention Outcome: Not Progressing

## 2017-11-20 NOTE — Addendum Note (Signed)
Addendum  created 11/20/17 2051 by Belinda Block, MD   Intraprocedure Event edited, Intraprocedure Staff edited

## 2017-11-20 NOTE — Progress Notes (Signed)
    Subjective  -   No complaints   Physical Exam:  Dressing changed.  Tissue granulating.  No graft exposed Palpable left DP pulse      Assessment/Plan:    Continue dressing changes D/c home at end of next week Continue abx Voiding trial next week  [name] 11/20/2017 1:42 PM --  Vitals:   11/20/17 1211 11/20/17 1247  BP: (!) 147/45 (!) 148/54  Pulse: (!) 59 64  Resp: 20 19  Temp:    SpO2: 94% 95%    Intake/Output Summary (Last 24 hours) at 11/20/2017 1342 Last data filed at 11/20/2017 0509 Gross per 24 hour  Intake 0 ml  Output 1550 ml  Net -1550 ml     Laboratory CBC    Component Value Date/Time   WBC 5.4 11/18/2017 0417   HGB 10.0 (L) 11/18/2017 0417   HCT 32.9 (L) 11/18/2017 0417   PLT 250 11/18/2017 0417    BMET    Component Value Date/Time   NA 136 11/18/2017 0417   K 4.5 11/18/2017 0417   CL 99 11/18/2017 0417   CO2 30 11/18/2017 0417   GLUCOSE 137 (H) 11/18/2017 0417   BUN 31 (H) 11/18/2017 0417   CREATININE 1.34 (H) 11/18/2017 0417   CREATININE 1.32 04/18/2012 0949   CALCIUM 8.4 (L) 11/18/2017 0417   GFRNONAA 49 (L) 11/18/2017 0417   GFRAA 57 (L) 11/18/2017 0417    COAG Lab Results  Component Value Date   INR 1.27 11/10/2017   INR 1.20 11/04/2017   INR 1.13 10/21/2017   No results found for: PTT  Antibiotics Anti-infectives (From admission, onward)   Start     Dose/Rate Route Frequency Ordered Stop   11/17/17 1100  cephALEXin (KEFLEX) capsule 500 mg     500 mg Oral Every 12 hours 11/17/17 1042     11/15/17 2000  ciprofloxacin (CIPRO) tablet 500 mg  Status:  Discontinued     500 mg Oral 2 times daily 11/15/17 1716 11/17/17 1042   11/11/17 0845  ceFAZolin (ANCEF) IVPB 1 g/50 mL premix  Status:  Discontinued     1 g 100 mL/hr over 30 Minutes Intravenous Every 8 hours 11/11/17 0831 11/15/17 1716   11/05/17 2200  vancomycin (VANCOCIN) 1,250 mg in sodium chloride 0.9 % 250 mL IVPB  Status:  Discontinued     1,250  mg 166.7 mL/hr over 90 Minutes Intravenous Every 24 hours 11/04/17 1851 11/06/17 1514   11/04/17 2200  piperacillin-tazobactam (ZOSYN) IVPB 3.375 g  Status:  Discontinued     3.375 g 12.5 mL/hr over 240 Minutes Intravenous Every 8 hours 11/04/17 1851 11/11/17 0830   11/04/17 1900  vancomycin (VANCOCIN) 2,000 mg in sodium chloride 0.9 % 500 mL IVPB     2,000 mg 250 mL/hr over 120 Minutes Intravenous  Once 11/04/17 1851 11/05/17 0009       V. Leia Alf, M.D. [facility] [place] Office: [phone] Pager:  [phone]

## 2017-11-20 NOTE — Progress Notes (Signed)
Changed patient's groin dressing. Dressing saturated and leaked onto patient's gown and blanket. Patient tolerated dressing change well. Will continue to monitor

## 2017-11-21 LAB — GLUCOSE, CAPILLARY
GLUCOSE-CAPILLARY: 116 mg/dL — AB (ref 70–99)
GLUCOSE-CAPILLARY: 177 mg/dL — AB (ref 70–99)
Glucose-Capillary: 124 mg/dL — ABNORMAL HIGH (ref 70–99)
Glucose-Capillary: 138 mg/dL — ABNORMAL HIGH (ref 70–99)

## 2017-11-21 NOTE — Progress Notes (Signed)
    Subjective  -   Walked yesterday Pain controlle   Physical Exam:  Dressing in place, recently changed Palpable left DP     Assessment/Plan:    Continue frequent dressing changes Continue abx Plan d/c later this week  [name] 11/21/2017 9:46 AM --  Vitals:   11/21/17 0400 11/21/17 0811  BP: 138/90 (!) 134/45  Pulse: (!) 57 (!) 58  Resp:  18  Temp: 98.4 F (36.9 C) 97.7 F (36.5 C)  SpO2: 94% 95%    Intake/Output Summary (Last 24 hours) at 11/21/2017 0946 Last data filed at 11/21/2017 0456 Gross per 24 hour  Intake 480 ml  Output 1876 ml  Net -1396 ml     Laboratory CBC    Component Value Date/Time   WBC 5.4 11/18/2017 0417   HGB 10.0 (L) 11/18/2017 0417   HCT 32.9 (L) 11/18/2017 0417   PLT 250 11/18/2017 0417    BMET    Component Value Date/Time   NA 136 11/18/2017 0417   K 4.5 11/18/2017 0417   CL 99 11/18/2017 0417   CO2 30 11/18/2017 0417   GLUCOSE 137 (H) 11/18/2017 0417   BUN 31 (H) 11/18/2017 0417   CREATININE 1.34 (H) 11/18/2017 0417   CREATININE 1.32 04/18/2012 0949   CALCIUM 8.4 (L) 11/18/2017 0417   GFRNONAA 49 (L) 11/18/2017 0417   GFRAA 57 (L) 11/18/2017 0417    COAG Lab Results  Component Value Date   INR 1.27 11/10/2017   INR 1.20 11/04/2017   INR 1.13 10/21/2017   No results found for: PTT  Antibiotics Anti-infectives (From admission, onward)   Start     Dose/Rate Route Frequency Ordered Stop   11/17/17 1100  cephALEXin (KEFLEX) capsule 500 mg     500 mg Oral Every 12 hours 11/17/17 1042     11/15/17 2000  ciprofloxacin (CIPRO) tablet 500 mg  Status:  Discontinued     500 mg Oral 2 times daily 11/15/17 1716 11/17/17 1042   11/11/17 0845  ceFAZolin (ANCEF) IVPB 1 g/50 mL premix  Status:  Discontinued     1 g 100 mL/hr over 30 Minutes Intravenous Every 8 hours 11/11/17 0831 11/15/17 1716   11/05/17 2200  vancomycin (VANCOCIN) 1,250 mg in sodium chloride 0.9 % 250 mL IVPB  Status:  Discontinued     1,250  mg 166.7 mL/hr over 90 Minutes Intravenous Every 24 hours 11/04/17 1851 11/06/17 1514   11/04/17 2200  piperacillin-tazobactam (ZOSYN) IVPB 3.375 g  Status:  Discontinued     3.375 g 12.5 mL/hr over 240 Minutes Intravenous Every 8 hours 11/04/17 1851 11/11/17 0830   11/04/17 1900  vancomycin (VANCOCIN) 2,000 mg in sodium chloride 0.9 % 500 mL IVPB     2,000 mg 250 mL/hr over 120 Minutes Intravenous  Once 11/04/17 1851 11/05/17 0009       V. Leia Alf, M.D. [facility] [place] Office: [phone] Pager:  [phone]

## 2017-11-21 NOTE — Plan of Care (Signed)
  Problem: Activity: Goal: Risk for activity intolerance will decrease Outcome: Progressing  Patient is willing to move more to get ready for discharge. He asked to use the bedside commode and tolerated the movement, to and from, well.

## 2017-11-22 LAB — GLUCOSE, CAPILLARY
GLUCOSE-CAPILLARY: 145 mg/dL — AB (ref 70–99)
GLUCOSE-CAPILLARY: 143 mg/dL — AB (ref 70–99)
Glucose-Capillary: 144 mg/dL — ABNORMAL HIGH (ref 70–99)
Glucose-Capillary: 140 mg/dL — ABNORMAL HIGH (ref 70–99)

## 2017-11-22 NOTE — Progress Notes (Addendum)
Physical Therapy Treatment Patient Details Name: [name] MRN: [id number] DOB: Jan 28, 1941 Today's Date: 11/22/2017    History of Present Illness 77 y.o. patient who underwent left common femoral endarterectomy and left femoral to below-knee popliteal bypass 6/03, discharged to SNF, returns for follow up with left groin wound infection, debrided and wound vac placed 6/14. PMH includes: foot drop L, DM, peripheral neuropathy, PVD, L food wounds, B RTC repair    PT Comments    Pt continues to make steady progress. Continue to recommend ST-SNF. Of note pt c/o tremors/jerking of UE's. Noted Friday and today they appear to be worse.   Follow Up Recommendations  SNF;Supervision/Assistance - 24 hour     Equipment Recommendations  Other (comment)(TBD at SNF)    Recommendations for Other Services       Precautions / Restrictions Precautions Precautions: Fall Restrictions Weight Bearing Restrictions: No LLE Weight Bearing: Weight bearing as tolerated    Mobility  Bed Mobility Overal bed mobility: Needs Assistance Bed Mobility: Supine to Sit     Supine to sit: Mod assist     General bed mobility comments: Assist to move LLE and to elevate trunk into sitting  Transfers Overall transfer level: Needs assistance Equipment used: Rolling walker (2 wheeled) Transfers: Sit to/from Stand Sit to Stand: +2 physical assistance;Mod assist;Min assist         General transfer comment: Assist to bring hips up and for balance. From bed and low w/c pt required 2 person min assist. From [facility] required 1 person mod assist.  Ambulation/Gait Ambulation/Gait assistance: Min assist Gait Distance (Feet): 75 Feet Assistive device: Rolling walker (2 wheeled) Gait Pattern/deviations: Decreased step length - right;Decreased step length - left;Trunk flexed;Step-through pattern;Decreased dorsiflexion - left Gait velocity: decr Gait velocity interpretation: <1.31 ft/sec, indicative of household  ambulator General Gait Details: Assist for balance and support. Verbal cues to stand more erect. Pt amb 10' to [facility] then 30' then sitting rest break and then 35' more.   Stairs             Wheelchair Mobility    Modified Rankin (Stroke Patients Only)       Balance Overall balance assessment: Needs assistance Sitting-balance support: Feet supported;No upper extremity supported Sitting balance-Leahy Scale: Fair     Standing balance support: Bilateral upper extremity supported;During functional activity Standing balance-Leahy Scale: Poor Standing balance comment: walker and min guard assist for static standing once standing established.                             Cognition Arousal/Alertness: Awake/alert Behavior During Therapy: WFL for tasks assessed/performed Overall Cognitive Status: Within Functional Limits for tasks assessed                                        Exercises      General Comments        Pertinent Vitals/Pain Pain Assessment: Faces Faces Pain Scale: Hurts little more Pain Location: L groin with movement Pain Descriptors / Indicators: Grimacing;Guarding Pain Intervention(s): Limited activity within patient's tolerance;Monitored during session    Home Living                      Prior Function            PT Goals (current goals can now be found in the  care plan section) Progress towards PT goals: Progressing toward goals;Goals met and updated - see care plan    Frequency    Min 2X/week      PT Plan Current plan remains appropriate    Co-evaluation PT/OT/SLP Co-Evaluation/Treatment: Yes            AM-PAC PT "6 Clicks" Daily Activity  Outcome Measure  Difficulty turning over in bed (including adjusting bedclothes, sheets and blankets)?: Unable Difficulty moving from lying on back to sitting on the side of the bed? : Unable Difficulty sitting down on and standing up from a chair with arms  (e.g., wheelchair, bedside commode, etc,.)?: Unable Help needed moving to and from a bed to chair (including a wheelchair)?: A Lot Help needed walking in hospital room?: A Little Help needed climbing 3-5 steps with a railing? : Total 6 Click Score: 9    End of Session Equipment Utilized During Treatment: Gait belt Activity Tolerance: Patient tolerated treatment well Patient left: with call bell/phone within reach;in chair(in w/c) Nurse Communication: Mobility status PT Visit Diagnosis: Unsteadiness on feet (R26.81);Other abnormalities of gait and mobility (R26.89);Pain Pain - Right/Left: Left Pain - part of body: Hip     Time: 8776-5486 PT Time Calculation (min) (ACUTE ONLY): 45 min  Charges:  $Gait Training: 38-52 mins                    G Codes:       [facility] PT [place] 11/22/2017, 5:46 PM

## 2017-11-22 NOTE — Progress Notes (Signed)
Patient called nurse into room. Feeling SOB. Raised HOB and placed nasal cannula with O2 at 2L/min. Will continue to monitor. Lajoyce Corners, RN

## 2017-11-22 NOTE — Progress Notes (Signed)
Palliative Medicine RN Note: Chart check done. Goals remain clear. Per PMT discussion w patient on 6/23, we will sign off at this time. Please reconsult [place] if his condition changes or he requests to speaks with [place] again.  Marjie Skiff Mando Blatz, RN, BSN, [facility] Palliative Medicine Team 11/22/2017 12:58 PM Office [phone]

## 2017-11-22 NOTE — Progress Notes (Signed)
Pt foley removed. Will continue to monitor.   Lilla Shook, BSN

## 2017-11-22 NOTE — Progress Notes (Signed)
[facility] [place]  Subjective  - feels ok   Objective (!) 139/51 60 98.4 F (36.9 C) (Oral) 17 96%  Intake/Output Summary (Last 24 hours) at 11/22/2017 0923 Last data filed at 11/22/2017 0506 Gross per 24 hour  Intake 720 ml  Output 3050 ml  Net -2330 ml   Left foot 2+ DP, dry gangrene toe and lateral ankle unchanged Left groin wound no purulence, healthy appearing granulation, no graft exposed, still a fairly deep cavity in the central portion  Assessment/Planning: Overall slow improvement.  Still at high risk for graft infection.  Wound needs to granulate more before he would be candidate for SNF.  Continue wet to dry and antibiotics  Urinary retention.  Voiding trial this morning  PT/OT to continue to get back to [facility]  Ruta Hinds 11/22/2017 9:23 AM --  Laboratory Lab Results: No results for input(s): WBC, HGB, HCT, PLT in the last 72 hours. BMET No results for input(s): NA, K, CL, CO2, GLUCOSE, BUN, CREATININE, CALCIUM in the last 72 hours.  COAG Lab Results  Component Value Date   INR 1.27 11/10/2017   INR 1.20 11/04/2017   INR 1.13 10/21/2017   No results found for: PTT

## 2017-11-23 LAB — GLUCOSE, CAPILLARY
Glucose-Capillary: 137 mg/dL — ABNORMAL HIGH (ref 70–99)
Glucose-Capillary: 160 mg/dL — ABNORMAL HIGH (ref 70–99)
Glucose-Capillary: 153 mg/dL — ABNORMAL HIGH (ref 70–99)

## 2017-11-23 NOTE — Care Management Important Message (Addendum)
Important Message  Patient Details  Name: [name] MRN: [id number] Date of Birth: 11-Dec-1940   Medicare Important Message Given:  Yes  Patent did not received Medicare Rights  Orbie Pyo 11/23/2017, 8:39 AM

## 2017-11-23 NOTE — Progress Notes (Signed)
Occupational Therapy Treatment Patient Details Name: [name] MRN: [id number] DOB: 12/14/1940 Today's Date: 11/23/2017    History of present illness 77 y.o. patient who underwent left common femoral endarterectomy and left femoral to below-knee popliteal bypass 6/03, discharged to SNF, returns for follow up with left groin wound infection, debrided and wound vac placed 6/14. PMH includes: foot drop L, DM, peripheral neuropathy, PVD, L food wounds, B RTC repair   OT comments  Pt seen for follow-up OT session to initiate BUE strengthening exercises using theraband (level 1) towards the goal of improved independence with functional transfers.   Follow Up Recommendations  SNF;Supervision/Assistance - 24 hour    Equipment Recommendations  Other (comment)(Defer to next venue)    Recommendations for Other Services      Precautions / Restrictions Precautions Precautions: Fall Restrictions Weight Bearing Restrictions: No LLE Weight Bearing: Weight bearing as tolerated       Mobility Bed Mobility                  Transfers                      Balance                                           ADL either performed or assessed with clinical judgement   ADL                                               Vision       Perception     Praxis      Cognition Arousal/Alertness: Awake/alert Behavior During Therapy: WFL for tasks assessed/performed Overall Cognitive Status: Within Functional Limits for tasks assessed                                          Exercises Exercises: General Upper Extremity;Other exercises Other Exercises Other Exercises: Issued level 1 theraband and instructed in use. bicep flexion and tricep extension. seated 1 reps.   Shoulder Instructions       General Comments      Pertinent Vitals/ Pain       Pain Assessment: Faces Faces Pain Scale: Hurts little more Pain  Location: groin with movement Pain Descriptors / Indicators: Grimacing;Guarding Pain Intervention(s): Monitored during session  Home Living                                          Prior Functioning/Environment              Frequency  Min 2X/week        Progress Toward Goals  OT Goals(current goals can now be found in the care plan section)  Progress towards OT goals: Progressing toward goals  Acute Rehab OT Goals Patient Stated Goal: Decrease pain OT Goal Formulation: With patient Time For Goal Achievement: 12/07/17 Potential to Achieve Goals: Good ADL Goals Pt Will Perform Grooming: with set-up;with supervision;sitting Pt Will Perform Upper Body Dressing: with set-up;with supervision;sitting Pt Will Perform Lower Body Dressing: sit  to/from stand;with min guard assist;with adaptive equipment Pt Will Transfer to Toilet: with min guard assist;ambulating;bedside commode Pt Will Perform Toileting - Clothing Manipulation and hygiene: with min guard assist;sit to/from stand Additional ADL Goal #1: Pt will perform bed mobility with Min Guard A in preparation for ADLs  Plan Discharge plan remains appropriate    Co-evaluation                 AM-PAC PT "6 Clicks" Daily Activity     Outcome Measure   Help from another person eating meals?: None Help from another person taking care of personal grooming?: A Little Help from another person toileting, which includes using toliet, bedpan, or urinal?: A Lot Help from another person bathing (including washing, rinsing, drying)?: A Lot Help from another person to put on and taking off regular upper body clothing?: A Little Help from another person to put on and taking off regular lower body clothing?: A Lot 6 Click Score: 16    End of Session    OT Visit Diagnosis: Unsteadiness on feet (R26.81);Other abnormalities of gait and mobility (R26.89);Muscle weakness (generalized) (M62.81);Pain Pain -  Right/Left: Left Pain - part of body: Leg   Activity Tolerance Patient tolerated treatment well   Patient Left in chair;with call bell/phone within reach   Nurse Communication          Time: 1833-5825 OT Time Calculation (min): 12 min  Charges: OT General Charges $OT Visit: 1 Visit OT Treatments $Therapeutic Exercise: 8-22 mins     Hortencia Pilar 11/23/2017, 1:28 PM

## 2017-11-23 NOTE — Progress Notes (Addendum)
Wound care done. Wet to dry dressing. No drainage and no odor. 6X9 cm hardened and reddened area at staples on inside of thigh just below wound. Warm to touch. Dr. Oneida Alar notified. Patient said he was called. Warm blanket applied. Temperature was 97.9. Patient tolerated well.   Emelda Fear, RN

## 2017-11-23 NOTE — Progress Notes (Signed)
Physical Therapy Treatment Patient Details Name: [name] MRN: [id number] DOB: 1941/02/18 Today's Date: 11/23/2017    History of Present Illness 77 y.o. patient who underwent left common femoral endarterectomy and left femoral to below-knee popliteal bypass 6/03, discharged to SNF, returns for follow up with left groin wound infection, debrided and wound vac placed 6/14. PMH includes: foot drop L, DM, peripheral neuropathy, PVD, L food wounds, B RTC repair    PT Comments    Pt continues to progress with amb. Still struggles with sit to stand.    Follow Up Recommendations  SNF;Supervision/Assistance - 24 hour     Equipment Recommendations  Other (comment)(TBD at SNF)    Recommendations for Other Services       Precautions / Restrictions Precautions Precautions: Fall Restrictions Weight Bearing Restrictions: No    Mobility  Bed Mobility Overal bed mobility: Needs Assistance Bed Mobility: Sit to Supine       Sit to supine: Mod assist   General bed mobility comments: Assist to bring legs back up into bed  Transfers Overall transfer level: Needs assistance Equipment used: Rolling walker (2 wheeled) Transfers: Sit to/from Stand Sit to Stand: +2 physical assistance;Mod assist         General transfer comment: Assist to bring hips up and for balance.   Ambulation/Gait Ambulation/Gait assistance: Min assist Gait Distance (Feet): 100 Feet Assistive device: Rolling walker (2 wheeled) Gait Pattern/deviations: Decreased step length - right;Decreased step length - left;Trunk flexed;Step-through pattern;Decreased dorsiflexion - left Gait velocity: decr Gait velocity interpretation: <1.31 ft/sec, indicative of household ambulator General Gait Details: Assist for balance and support. Verbal cues to stand more erect. No seated rest breaks   Stairs             Wheelchair Mobility    Modified Rankin (Stroke Patients Only)       Balance Overall balance  assessment: Needs assistance Sitting-balance support: Feet supported;No upper extremity supported Sitting balance-Leahy Scale: Fair     Standing balance support: Bilateral upper extremity supported;During functional activity Standing balance-Leahy Scale: Poor Standing balance comment: walker and min guard assist for static standing once standing established.                             Cognition Arousal/Alertness: Awake/alert Behavior During Therapy: WFL for tasks assessed/performed Overall Cognitive Status: Within Functional Limits for tasks assessed                                        Exercises      General Comments        Pertinent Vitals/Pain Pain Assessment: Faces Faces Pain Scale: Hurts little more Pain Location: L groin with movement Pain Descriptors / Indicators: Grimacing;Guarding Pain Intervention(s): Monitored during session    Home Living                      Prior Function            PT Goals (current goals can now be found in the care plan section) Progress towards PT goals: Progressing toward goals    Frequency    Min 2X/week      PT Plan Current plan remains appropriate    Co-evaluation PT/OT/SLP Co-Evaluation/Treatment: Yes            AM-PAC PT "6 Clicks" Daily Activity  Outcome Measure  Difficulty turning over in bed (including adjusting bedclothes, sheets and blankets)?: Unable Difficulty moving from lying on back to sitting on the side of the bed? : Unable Difficulty sitting down on and standing up from a chair with arms (e.g., wheelchair, bedside commode, etc,.)?: Unable Help needed moving to and from a bed to chair (including a wheelchair)?: A Lot Help needed walking in hospital room?: A Little Help needed climbing 3-5 steps with a railing? : Total 6 Click Score: 9    End of Session Equipment Utilized During Treatment: Gait belt Activity Tolerance: Patient tolerated treatment  well Patient left: with call bell/phone within reach;in bed(in w/c) Nurse Communication: Mobility status PT Visit Diagnosis: Unsteadiness on feet (R26.81);Other abnormalities of gait and mobility (R26.89);Pain Pain - Right/Left: Left Pain - part of body: Hip     Time: 1007-1219 PT Time Calculation (min) (ACUTE ONLY): 26 min  Charges:  $Gait Training: 23-37 mins                    G Codes:       [facility] PT [place] 11/23/2017, 5:05 PM

## 2017-11-23 NOTE — Progress Notes (Signed)
Nutrition Follow-up  DOCUMENTATION CODES:   Obesity unspecified  INTERVENTION:   - Continue Atkins Shakes po TID, each provides 160 kcal and 15 grams protein  - Encourage PO intake  NUTRITION DIAGNOSIS:   Increased nutrient needs related to wound healing as evidenced by estimated needs.  Ongoing, being addressed with oral nutrition supplements  GOAL:   Patient will meet greater than or equal to 90% of their needs  Progressing  MONITOR:   PO intake, Supplement acceptance, Labs, Weight trends  REASON FOR ASSESSMENT:   Consult Diet education  ASSESSMENT:   77 yo male post left common femoral endarterectomy and left femoral to below-knee popliteal bypass with vein 10 days ago admitted with poorly healing left groin. Pt with hx of DM, CAD s/p CABG, HTN, CHF, CKD III, PAD  6/14 - s/p debridement of L groin wound  Pt sleeping upon RD visit. RD attempted several times to wake pt but was unable to do so. Spoke with RN who reports pt has been eating well. Per RN, pt has had friends bring him outside food. RD noted [employer] cup, wrapped pastry, and large can of peanuts at pt's bedside. Per RN, pt[place] the [place] which is why friends have been checking in on him.  Pt with visitors entering room at end of RD talk with RN.  Meal Completion: 50-100%  Medications reviewed and include: 40 mg Lasix daily, sliding scale Novolog, 50 mcg levothyroxine daily, MVI with minerals daily, 40 mg Protonix daily, 30 mg Actos daily  Labs reviewed. Last CBC and BMP from 6/27. CBG's: 160, 137, 140, 143 x 24 hours  UOP: 1450 ml x 24 hours I/O's: -15.5 L since 11/09/17   Diet Order:   Diet Order           Diet regular Room service appropriate? Yes; Fluid consistency: Thin  Diet effective now          EDUCATION NEEDS:   Not appropriate for education at this time  Skin:  Skin Assessment: Skin Integrity Issues: Skin Integrity Issues:: Wound VAC, Other (Comment) Wound Vac:  groin Other: venous stasis ulcers on foot, toe  Last BM:  11/22/17  Height:   Ht Readings from Last 1 Encounters:  11/05/17 5\' 11"  (1.803 m)    Weight:   Wt Readings from Last 1 Encounters:  11/23/17 263 lb 14.3 oz (119.7 kg)    Ideal Body Weight:  78.2 kg  BMI:  Body mass index is 36.81 kg/m.  Estimated Nutritional Needs:   Kcal:  2000-2200 kcal/day  Protein:  115-130 gm/day  Fluid:  >/= 2 L/day    Gaynell Face, MS, RD, LDN Pager: [phone] Weekend/After Hours: [phone]

## 2017-11-23 NOTE — Progress Notes (Signed)
Clinical Social Worker following patient for support and discharge needs. Patient and family agreeable to discharge back to [place] once medically stable.  Rhea Pink, MSW,  [place]

## 2017-11-23 NOTE — Progress Notes (Signed)
Occupational Therapy Treatment Patient Details Name: [name] MRN: [id number] DOB: [birth year] Today's Date: 11/23/2017    History of present illness 77 y.o. patient who underwent left common femoral endarterectomy and left femoral to below-knee popliteal bypass 6/03, discharged to SNF, returns for follow up with left groin wound infection, debrided and wound vac placed 6/14. PMH includes: foot drop L, DM, peripheral neuropathy, PVD, L food wounds, B RTC repair   OT comments  Pt progressing towards acute OT goals. Focus of session was toilet transfer and pericare. Pt reporting numbness in LLE. D/c plan remains appropriate.     Follow Up Recommendations  SNF;Supervision/Assistance - 24 hour    Equipment Recommendations  Other (comment)(Defer to next venue)    Recommendations for Other Services      Precautions / Restrictions Precautions Precautions: Fall Restrictions Weight Bearing Restrictions: No LLE Weight Bearing: Weight bearing as tolerated       Mobility Bed Mobility               General bed mobility comments: up in w/c (pt preference)  Transfers Overall transfer level: Needs assistance Equipment used: Rolling walker (2 wheeled) Transfers: Sit to/from Stand Sit to Stand: +2 physical assistance;Mod assist;Min assist         General transfer comment: Assist to powerup hips and to steady. +2 mod A from w/c, mod A (+2 s/e) for pericare.     Balance Overall balance assessment: Needs assistance Sitting-balance support: Feet supported;No upper extremity supported Sitting balance-Leahy Scale: Fair Sitting balance - Comments: UE support sitting EOB   Standing balance support: Bilateral upper extremity supported;During functional activity Standing balance-Leahy Scale: Poor Standing balance comment: walker and min guard assist for static standing once standing established.                            ADL either performed or assessed with  clinical judgement   ADL Overall ADL's : Needs assistance/impaired                         Toilet Transfer: Moderate assistance;+2 for physical assistance;Stand-pivot;Ambulation;BSC;RW Toilet Transfer Details (indicate cue type and reason): pivotal steps from w/c>bsc. BSC brought behind pt due to urgency Toileting- Clothing Manipulation and Hygiene: Moderate assistance;+2 for safety/equipment;Sit to/from stand Toileting - Clothing Manipulation Details (indicate cue type and reason): pt stood with rw and assist, therapist completed pericare       General ADL Comments: Pt completed toilet transfer and pericareas detailed above.      Vision       Perception     Praxis      Cognition Arousal/Alertness: Awake/alert Behavior During Therapy: WFL for tasks assessed/performed Overall Cognitive Status: Within Functional Limits for tasks assessed                                          Exercises     Shoulder Instructions       General Comments      Pertinent Vitals/ Pain       Pain Assessment: Faces Faces Pain Scale: Hurts even more Pain Location: groin with movement Pain Descriptors / Indicators: Grimacing;Guarding Pain Intervention(s): Limited activity within patient's tolerance;Monitored during session;Repositioned  Home Living  Prior Functioning/Environment              Frequency  Min 2X/week        Progress Toward Goals  OT Goals(current goals can now be found in the care plan section)  Progress towards OT goals: Progressing toward goals  Acute Rehab OT Goals Patient Stated Goal: Decrease pain OT Goal Formulation: With patient Time For Goal Achievement: 12/07/17 Potential to Achieve Goals: Good ADL Goals Pt Will Perform Grooming: with set-up;with supervision;sitting Pt Will Perform Upper Body Dressing: with set-up;with supervision;sitting Pt Will Perform Lower Body  Dressing: sit to/from stand;with min guard assist;with adaptive equipment Pt Will Transfer to Toilet: with min guard assist;ambulating;bedside commode Pt Will Perform Toileting - Clothing Manipulation and hygiene: with min guard assist;sit to/from stand Additional ADL Goal #1: Pt will perform bed mobility with Min Guard A in preparation for ADLs  Plan Discharge plan remains appropriate    Co-evaluation                 AM-PAC PT "6 Clicks" Daily Activity     Outcome Measure   Help from another person eating meals?: None Help from another person taking care of personal grooming?: A Little Help from another person toileting, which includes using toliet, bedpan, or urinal?: A Lot Help from another person bathing (including washing, rinsing, drying)?: A Lot Help from another person to put on and taking off regular upper body clothing?: A Little Help from another person to put on and taking off regular lower body clothing?: A Lot 6 Click Score: 16    End of Session Equipment Utilized During Treatment: Gait belt;Rolling walker  OT Visit Diagnosis: Unsteadiness on feet (R26.81);Other abnormalities of gait and mobility (R26.89);Muscle weakness (generalized) (M62.81);Pain Pain - Right/Left: Left Pain - part of body: Leg   Activity Tolerance Patient tolerated treatment well   Patient Left in chair;with call bell/phone within reach   Nurse Communication          Time: 5364-6803 OT Time Calculation (min): 30 min  Charges: OT General Charges $OT Visit: 1 Visit OT Treatments $Self Care/Home Management : 23-37 mins     Hortencia Pilar 11/23/2017, 10:18 AM

## 2017-11-23 NOTE — Progress Notes (Signed)
[facility]  Subjective  - feels ok   Objective (!) 144/46 (!) 57 98.7 F (37.1 C) (Oral) 17 92%  Intake/Output Summary (Last 24 hours) at 11/23/2017 0801 Last data filed at 11/23/2017 0358 Gross per 24 hour  Intake 420 ml  Output 1451 ml  Net -1031 ml   Left leg incisions slowly healing Left groin wound about the same, some fibrinous exudate in the central deeper cavity, no purulence Left foot dry gangrene of toe  Was able to void after foley removal  Assessment/Planning: Slowly healing but tenuous groin wound Continue antibiotics, continue OTPT ambulate Possibly VAC later this week Recheck labwork tomorrow  [name] 11/23/2017 8:01 AM --  Laboratory Lab Results: No results for input(s): WBC, HGB, HCT, PLT in the last 72 hours. BMET No results for input(s): NA, K, CL, CO2, GLUCOSE, BUN, CREATININE, CALCIUM in the last 72 hours.  COAG Lab Results  Component Value Date   INR 1.27 11/10/2017   INR 1.20 11/04/2017   INR 1.13 10/21/2017   No results found for: PTT

## 2017-11-24 LAB — CBC
HCT: 32.9 % — ABNORMAL LOW (ref 39.0–52.0)
HEMOGLOBIN: 10.3 g/dL — AB (ref 13.0–17.0)
MCH: 30.9 pg (ref 26.0–34.0)
MCHC: 31.3 g/dL (ref 30.0–36.0)
MCV: 98.8 fL (ref 78.0–100.0)
Platelets: 207 10*3/uL (ref 150–400)
RBC: 3.33 MIL/uL — AB (ref 4.22–5.81)
RDW: 18.7 % — ABNORMAL HIGH (ref 11.5–15.5)
WBC: 4.8 10*3/uL (ref 4.0–10.5)

## 2017-11-24 LAB — COMPREHENSIVE METABOLIC PANEL
ALBUMIN: 2.3 g/dL — AB (ref 3.5–5.0)
ALK PHOS: 38 U/L (ref 38–126)
ALT: 11 U/L (ref 0–44)
AST: 25 U/L (ref 15–41)
Anion gap: 8 (ref 5–15)
BILIRUBIN TOTAL: 0.8 mg/dL (ref 0.3–1.2)
BUN: 22 mg/dL (ref 8–23)
CALCIUM: 8.5 mg/dL — AB (ref 8.9–10.3)
CO2: 30 mmol/L (ref 22–32)
CREATININE: 1.38 mg/dL — AB (ref 0.61–1.24)
Chloride: 99 mmol/L (ref 98–111)
GFR calc non Af Amer: 47 mL/min — ABNORMAL LOW (ref >60)
GFR, EST AFRICAN AMERICAN: 55 mL/min — AB (ref >60)
GLUCOSE: 135 mg/dL — AB (ref 70–99)
Potassium: 4.2 mmol/L (ref 3.5–5.1)
SODIUM: 137 mmol/L (ref 135–145)
TOTAL PROTEIN: 4.9 g/dL — AB (ref 6.5–8.1)

## 2017-11-24 LAB — GLUCOSE, CAPILLARY
GLUCOSE-CAPILLARY: 216 mg/dL — AB (ref 70–99)
GLUCOSE-CAPILLARY: 140 mg/dL — AB (ref 70–99)
Glucose-Capillary: 124 mg/dL — ABNORMAL HIGH (ref 70–99)
Glucose-Capillary: 137 mg/dL — ABNORMAL HIGH (ref 70–99)
Glucose-Capillary: 137 mg/dL — ABNORMAL HIGH (ref 70–99)
Glucose-Capillary: 168 mg/dL — ABNORMAL HIGH (ref 70–99)

## 2017-11-24 MED ORDER — FERROUS SULFATE 325 (65 FE) MG PO TABS
325.0000 mg | ORAL_TABLET | Freq: Three times a day (TID) | ORAL | Status: DC
  Administered 2017-11-24 – 2017-12-03 (×26): 325 mg via ORAL
  Filled 2017-11-24 (×25): qty 1

## 2017-11-24 NOTE — Progress Notes (Signed)
Performed physician ordered wet to dry dressing change to left groin. Patient sleepy, tolerated procedure well. Wound bed beefy red with moderate serous drainage. Saturation improved on 2 liters [place], oxygen saturation in the mid to upper 90s. Will continue to monitor.

## 2017-11-24 NOTE — Progress Notes (Addendum)
Performed physician ordered wet to dry dressing change to left groin. Wound bed beefy red with moderate serous drainage. Odor noted to wound. Gown damp from old drainage. Will continue to monitor.  Leontine Locket, PA paged at this time and notified of odor change.  Emelda Fear, RN

## 2017-11-24 NOTE — Progress Notes (Signed)
Physical Therapy Treatment Patient Details Name: [name] MRN: [id number] DOB: 30-Apr-1941 Today's Date: 11/24/2017    History of Present Illness 77 y.o. patient who underwent left common femoral endarterectomy and left femoral to below-knee popliteal bypass 6/03, discharged to SNF, returns for follow up with left groin wound infection, debrided and wound vac placed 6/14. PMH includes: foot drop L, DM, peripheral neuropathy, PVD, L food wounds, B RTC repair    PT Comments    Pt continues to progress with mobility.   Follow Up Recommendations  SNF;Supervision/Assistance - 24 hour     Equipment Recommendations  Other (comment)(TBD at SNF)    Recommendations for Other Services       Precautions / Restrictions Precautions Precautions: Fall Restrictions Weight Bearing Restrictions: No LLE Weight Bearing: Weight bearing as tolerated    Mobility  Bed Mobility               General bed mobility comments: Pt sitting up on BSC  Transfers Overall transfer level: Needs assistance Equipment used: Rolling walker (2 wheeled) Transfers: Sit to/from Stand Sit to Stand: +2 physical assistance;Min assist         General transfer comment: Assist to bring hips up and for balance.   Ambulation/Gait Ambulation/Gait assistance: Min assist Gait Distance (Feet): 115 Feet Assistive device: Rolling walker (2 wheeled) Gait Pattern/deviations: Decreased step length - right;Decreased step length - left;Trunk flexed;Step-through pattern;Decreased dorsiflexion - left Gait velocity: decr Gait velocity interpretation: <1.31 ft/sec, indicative of household ambulator General Gait Details: Assist for balance and support. Verbal cues to stand more erect. No seated rest breaks   Stairs             Wheelchair Mobility    Modified Rankin (Stroke Patients Only)       Balance Overall balance assessment: Needs assistance Sitting-balance support: Feet supported;No upper  extremity supported Sitting balance-Leahy Scale: Fair     Standing balance support: Bilateral upper extremity supported;During functional activity Standing balance-Leahy Scale: Poor Standing balance comment: walker and min guard assist for static standing once standing established.                             Cognition Arousal/Alertness: Awake/alert Behavior During Therapy: WFL for tasks assessed/performed Overall Cognitive Status: Within Functional Limits for tasks assessed                                        Exercises      General Comments        Pertinent Vitals/Pain Pain Assessment: Faces Faces Pain Scale: Hurts little more Pain Location: lt ankle/calf Pain Descriptors / Indicators: Grimacing;Guarding Pain Intervention(s): Limited activity within patient's tolerance;Monitored during session    Home Living                      Prior Function            PT Goals (current goals can now be found in the care plan section) Progress towards PT goals: Progressing toward goals    Frequency    Min 2X/week      PT Plan Current plan remains appropriate    Co-evaluation PT/OT/SLP Co-Evaluation/Treatment: Yes            AM-PAC PT "6 Clicks" Daily Activity  Outcome Measure  Difficulty turning over in bed (including adjusting  bedclothes, sheets and blankets)?: Unable Difficulty moving from lying on back to sitting on the side of the bed? : Unable Difficulty sitting down on and standing up from a chair with arms (e.g., wheelchair, bedside commode, etc,.)?: Unable Help needed moving to and from a bed to chair (including a wheelchair)?: A Lot Help needed walking in hospital room?: A Little Help needed climbing 3-5 steps with a railing? : Total 6 Click Score: 9    End of Session Equipment Utilized During Treatment: Gait belt Activity Tolerance: Patient tolerated treatment well Patient left: with call bell/phone within  reach(in w/c) Nurse Communication: Mobility status PT Visit Diagnosis: Unsteadiness on feet (R26.81);Other abnormalities of gait and mobility (R26.89);Pain Pain - Right/Left: Left Pain - part of body: Hip     Time: 0950-1010 PT Time Calculation (min) (ACUTE ONLY): 20 min  Charges:  $Gait Training: 8-22 mins                    G Codes:       [facility] PT [place] 11/24/2017, 10:51 AM

## 2017-11-24 NOTE — Progress Notes (Addendum)
  Progress Note    11/24/2017 8:12 AM 14 Days Post-Op  Subjective:  No complaints  Tm 99.5 now afebrile HR 50's-70's NSR 357'S-177'L systolic 39% [id number]  Vitals:   11/24/17 0308 11/24/17 0751  BP: (!) 114/52 (!) 126/44  Pulse: 60 60  Resp: (!) 23 20  Temp:  98.3 F (36.8 C)  SpO2: 95% 96%    Physical Exam: Cardiac:  Regular Lungs:  Non labored  Incisions:  Beefy red with some fibrinous tissue present in the wound base Extremities:  2+ pitting edema LLE; palpable left DP pulse   CBC    Component Value Date/Time   WBC 4.8 11/24/2017 0340   RBC 3.33 (L) 11/24/2017 0340   HGB 10.3 (L) 11/24/2017 0340   HCT 32.9 (L) 11/24/2017 0340   PLT 207 11/24/2017 0340   MCV 98.8 11/24/2017 0340   MCH 30.9 11/24/2017 0340   MCHC 31.3 11/24/2017 0340   RDW 18.7 (H) 11/24/2017 0340   LYMPHSABS 0.7 10/28/2017 2219   MONOABS 0.8 10/28/2017 2219   EOSABS 0.1 10/28/2017 2219   BASOSABS 0.0 10/28/2017 2219    BMET    Component Value Date/Time   NA 137 11/24/2017 0340   K 4.2 11/24/2017 0340   CL 99 11/24/2017 0340   CO2 30 11/24/2017 0340   GLUCOSE 135 (H) 11/24/2017 0340   BUN 22 11/24/2017 0340   CREATININE 1.38 (H) 11/24/2017 0340   CREATININE 1.32 04/18/2012 0949   CALCIUM 8.5 (L) 11/24/2017 0340   GFRNONAA 47 (L) 11/24/2017 0340   GFRAA 55 (L) 11/24/2017 0340    INR    Component Value Date/Time   INR 1.27 11/10/2017 0336     Intake/Output Summary (Last 24 hours) at 11/24/2017 3300 Last data filed at 11/23/2017 2036 Gross per 24 hour  Intake -  Output 651 ml  Net -651 ml      Plan: -pt continues to have palpable left DP pulse-left foot/leg with 2+ pitting edema today.  discussed with RN to elevate leg when he is not out of bed.  -wound bed has beefy red tissue with some fibrinous tissue present over past couple of days per Dr. Oneida Alar -continue to push nutrition/protein as albumin today is 2.3 -continues to be able to void since foley out.  -continue  abx -albumin -DVT prophylaxis:  Sq heparin   Leontine Locket, PA-C [facility] [phone] 11/24/2017 8:12 AM  As above.  Still with some fibrinous exudate and a small cavity in the central portion of the wound.  This needs to be closed up before we can consider VAC so that we don't trap the cavity No real excessive drainage at this point so continue Wet to dry Push nutrition.  Still with severe protein calorie malnutrition but albumin slowly rising. Urinary retention seems resolved. Acute and chronic blood loss anemia stable trend for now. Start iron supplement. Continue OT/PT  Ruta Hinds, MD [facility] [place] Office: [phone] Pager: [phone]

## 2017-11-24 NOTE — Progress Notes (Signed)
Dr. Oneida Alar into assess patient and do dressing change.

## 2017-11-24 NOTE — Progress Notes (Signed)
Patient has dropped saturation level 2x in last hour to 70s and mid 80s while asleep. Will administer 2L of oxygen via [place] and will continue to monitor.

## 2017-11-25 LAB — GLUCOSE, CAPILLARY
GLUCOSE-CAPILLARY: 137 mg/dL — AB (ref 70–99)
Glucose-Capillary: 136 mg/dL — ABNORMAL HIGH (ref 70–99)
Glucose-Capillary: 146 mg/dL — ABNORMAL HIGH (ref 70–99)
Glucose-Capillary: 110 mg/dL — ABNORMAL HIGH (ref 70–99)

## 2017-11-25 NOTE — Progress Notes (Addendum)
[facility] [place]  Subjective  - No new complaints.     Objective (!) 124/48 (!) 56 98 F (36.7 C) (Oral) 14 94%  Intake/Output Summary (Last 24 hours) at 11/25/2017 0801 Last data filed at 11/25/2017 0225 Gross per 24 hour  Intake 240 ml  Output 525 ml  Net -285 ml    Left groin wound bed with good granulation tissue with minimal mixed yellow fiborous tissue, SS drainage on old dressing changed this am at 4.   Doppler signals brisk PT/DP/peroneal.  Palpable DP left.   Incision healing well left LE staples intact.    Assessment/Planning: 10/25/2017 Left common femoral endarterectomy with profundoplasty, left femoral to below-knee popliteal bypass with non-reversed left greater saphenous vein  11/05/2017 Excisional debridement of left groin placement of VAC dressing for poor healing groin incision.  11/10/2017 Debridement left groin, placement of VAC dressing  Currently wet to dry dressing changes.   Continue to address nutritional needs with assistance form Dietitian.  Continue Atkins Shakes po TID, each provides 160 kcal and 15 grams protein DVT prophylaxis:  Sq heparin Mobility as tolerates Continued UO with Cr stable 1.3 last several days.     Roxy Horseman 11/25/2017 8:01 AM --  Laboratory Lab Results: Recent Labs    11/24/17 0340  WBC 4.8  HGB 10.3*  HCT 32.9*  PLT 207   BMET Recent Labs    11/24/17 0340  NA 137  K 4.2  CL 99  CO2 30  GLUCOSE 135*  BUN 22  CREATININE 1.38*  CALCIUM 8.5*    COAG Lab Results  Component Value Date   INR 1.27 11/10/2017   INR 1.20 11/04/2017   INR 1.13 10/21/2017   No results found for: PTT  I have examined the patient, reviewed and agree with above.  Curt Jews, MD 11/25/2017 10:04 AM

## 2017-11-25 NOTE — Progress Notes (Signed)
Remaining staples removed from the lower extremity. Pt tolerated well. Incision clean and intact. Will continue to monitor.  Clyde Canterbury, RN

## 2017-11-26 LAB — GLUCOSE, CAPILLARY
GLUCOSE-CAPILLARY: 146 mg/dL — AB (ref 70–99)
Glucose-Capillary: 139 mg/dL — ABNORMAL HIGH (ref 70–99)
Glucose-Capillary: 168 mg/dL — ABNORMAL HIGH (ref 70–99)
Glucose-Capillary: 164 mg/dL — ABNORMAL HIGH (ref 70–99)

## 2017-11-26 NOTE — Progress Notes (Addendum)
  Progress Note    11/26/2017 8:48 AM 16 Days Post-Op  Subjective:  No new complaints   Vitals:   11/26/17 0455 11/26/17 0724  BP: (!) 143/45 (!) 127/44  Pulse: 60 68  Resp: 18 (!) 26  Temp: 98.3 F (36.8 C) 98.6 F (37 C)  SpO2: 96% 91%   Physical Exam: Lungs:  Non labored Incisions:  L groin incision dressing left in place Extremities:  Palpable L DP; pitting edema LLE to level of knee Abdomen:  Soft Neurologic: A&O  CBC    Component Value Date/Time   WBC 4.8 11/24/2017 0340   RBC 3.33 (L) 11/24/2017 0340   HGB 10.3 (L) 11/24/2017 0340   HCT 32.9 (L) 11/24/2017 0340   PLT 207 11/24/2017 0340   MCV 98.8 11/24/2017 0340   MCH 30.9 11/24/2017 0340   MCHC 31.3 11/24/2017 0340   RDW 18.7 (H) 11/24/2017 0340   LYMPHSABS 0.7 10/28/2017 2219   MONOABS 0.8 10/28/2017 2219   EOSABS 0.1 10/28/2017 2219   BASOSABS 0.0 10/28/2017 2219    BMET    Component Value Date/Time   NA 137 11/24/2017 0340   K 4.2 11/24/2017 0340   CL 99 11/24/2017 0340   CO2 30 11/24/2017 0340   GLUCOSE 135 (H) 11/24/2017 0340   BUN 22 11/24/2017 0340   CREATININE 1.38 (H) 11/24/2017 0340   CREATININE 1.32 04/18/2012 0949   CALCIUM 8.5 (L) 11/24/2017 0340   GFRNONAA 47 (L) 11/24/2017 0340   GFRAA 55 (L) 11/24/2017 0340    INR    Component Value Date/Time   INR 1.27 11/10/2017 0336     Intake/Output Summary (Last 24 hours) at 11/26/2017 0848 Last data filed at 11/26/2017 0450 Gross per 24 hour  Intake 395 ml  Output 1950 ml  Net -1555 ml     Assessment/Plan:  77 y.o. male is s/p  10/25/2017 Left common femoral endarterectomy with profundoplasty, left femoral to below-knee popliteal bypass with non-reversed left greater saphenous vein  11/05/2017 Excisional debridement of left groin placement of VAC dressing for poor healing groin incision.  11/10/2017 Debridement left groin,placement of VAC dressing   Bypass patent with palpable L DP pulse Encouraged mobility Will change  dressing with Dr. Oneida Alar later today   Dagoberto Ligas, PA-C [facility] [phone] 11/26/2017 8:48 AM   Left groin wound inspected still with central cavity but smaller.  Some fibrinous exudate but 90% granulation Continue local wound care Most likely SNF 7/8 if continues to make progress  Ruta Hinds, MD [facility] [place]: [phone] Pager: [phone]

## 2017-11-26 NOTE — Progress Notes (Signed)
This nurse was present in room with Dr Oneida Alar during Left abdomen/groin dressing change. Dressing change occurred at approximately 1115. Patient tolerated well. Wet to dry dressing packing in abdominal/groin wound with dry 4x4s in place with Medipore tape closure. Patient declined pain medication offered. Patient assisted by this nurse to transfer back to wheel chair following dressing change.

## 2017-11-26 NOTE — Progress Notes (Signed)
Physical Therapy Treatment Patient Details Name: [name] MRN: [id number] DOB: 13-Nov-1940 Today's Date: 11/26/2017    History of Present Illness 77 y.o. patient who underwent left common femoral endarterectomy and left femoral to below-knee popliteal bypass 6/03, discharged to SNF, returns for follow up with left groin wound infection, debrided and wound vac placed 6/14. PMH includes: foot drop L, DM, peripheral neuropathy, PVD, L food wounds, B RTC repair    PT Comments    Pt continues to make slow, steady progress. Continue to recommend ST-SNF for further rehab to eventually progress to return home.    Follow Up Recommendations  SNF;Supervision/Assistance - 24 hour     Equipment Recommendations  Other (comment)(TBD at SNF)    Recommendations for Other Services       Precautions / Restrictions Precautions Precautions: Fall Restrictions Weight Bearing Restrictions: No LLE Weight Bearing: Weight bearing as tolerated    Mobility  Bed Mobility               General bed mobility comments: Pt sitting up on EOB  Transfers Overall transfer level: Needs assistance Equipment used: Rolling walker (2 wheeled) Transfers: Sit to/from Stand Sit to Stand: Min assist;From elevated surface         General transfer comment: Assist to bring hips up and for balance.   Ambulation/Gait Ambulation/Gait assistance: Min assist Gait Distance (Feet): 130 Feet Assistive device: Rolling walker (2 wheeled) Gait Pattern/deviations: Decreased step length - right;Decreased step length - left;Trunk flexed;Step-through pattern;Decreased dorsiflexion - left Gait velocity: decr Gait velocity interpretation: <1.31 ft/sec, indicative of household ambulator General Gait Details: Assist for balance and support. Verbal cues to stand more erect. No seated rest breaks   Stairs             Wheelchair Mobility    Modified Rankin (Stroke Patients Only)       Balance Overall  balance assessment: Needs assistance Sitting-balance support: Feet supported;No upper extremity supported Sitting balance-Leahy Scale: Fair     Standing balance support: Bilateral upper extremity supported;During functional activity Standing balance-Leahy Scale: Poor Standing balance comment: walker and min guard assist for static standing once standing established.                             Cognition Arousal/Alertness: Awake/alert Behavior During Therapy: WFL for tasks assessed/performed Overall Cognitive Status: Within Functional Limits for tasks assessed                                        Exercises      General Comments        Pertinent Vitals/Pain Pain Assessment: Faces Faces Pain Scale: Hurts little more Pain Location: lt ankle/calf Pain Descriptors / Indicators: Grimacing;Sore Pain Intervention(s): Limited activity within patient's tolerance;Monitored during session    Home Living                      Prior Function            PT Goals (current goals can now be found in the care plan section) Progress towards PT goals: Progressing toward goals    Frequency    Min 2X/week      PT Plan Current plan remains appropriate    Co-evaluation PT/OT/SLP Co-Evaluation/Treatment: Yes            AM-PAC PT "  6 Clicks" Daily Activity  Outcome Measure  Difficulty turning over in bed (including adjusting bedclothes, sheets and blankets)?: Unable Difficulty moving from lying on back to sitting on the side of the bed? : Unable Difficulty sitting down on and standing up from a chair with arms (e.g., wheelchair, bedside commode, etc,.)?: Unable Help needed moving to and from a bed to chair (including a wheelchair)?: A Lot Help needed walking in hospital room?: A Little Help needed climbing 3-5 steps with a railing? : Total 6 Click Score: 9    End of Session Equipment Utilized During Treatment: Gait belt Activity  Tolerance: Patient tolerated treatment well Patient left: with call bell/phone within reach(in w/c) Nurse Communication: Mobility status PT Visit Diagnosis: Unsteadiness on feet (R26.81);Other abnormalities of gait and mobility (R26.89)     Time: 8341-9622 PT Time Calculation (min) (ACUTE ONLY): 37 min  Charges:  $Gait Training: 23-37 mins                    G Codes:       [facility] PT [place] 11/26/2017, 10:20 AM

## 2017-11-27 LAB — GLUCOSE, CAPILLARY
Glucose-Capillary: 136 mg/dL — ABNORMAL HIGH (ref 70–99)
Glucose-Capillary: 140 mg/dL — ABNORMAL HIGH (ref 70–99)
Glucose-Capillary: 125 mg/dL — ABNORMAL HIGH (ref 70–99)

## 2017-11-27 NOTE — Progress Notes (Signed)
Notified by tele-monitoring, patient's oxygen saturation level dropped to 83%, upon entering the room found patient asleep and O2  In mid-80s. Placed the patient on 2 liters of oxygen via [place], patient's saturation improved to 93%. Will continue to monitor.

## 2017-11-27 NOTE — Progress Notes (Signed)
   VASCULAR SURGERY ASSESSMENT & PLAN:   His wound is granulating at the inferior aspect.  Still has a significant area that I gently packed.  ID: He continues p.o. Keflex.  Cultures from the wound grew Klebsiella and E. coli sensitive to cephalosporins.  Continue meticulous wound care.  SUBJECTIVE:   No specific complaints this morning  PHYSICAL EXAM:   Vitals:   11/26/17 1400 11/26/17 1543 11/27/17 0003 11/27/17 0435  BP: (!) 108/57 (!) 140/58 (!) 143/42 (!) 147/62  Pulse: (!) 53 (!) 57 66 64  Resp: 18 20 20 17   Temp: 97.8 F (36.6 C) 98 F (36.7 C) 98.8 F (37.1 C) 98.1 F (36.7 C)  TempSrc: Oral Oral Oral Oral  SpO2: 100% 100% 90% 97%  Weight:      Height:       I changed his dressing in the left groin.  There is good granulation tissue inferiorly.  The wound was gently packed.  There is some  moderate serous drainage.  LABS:   CBG (last 3)  Recent Labs    11/26/17 1550 11/26/17 2217 11/27/17 0617  GLUCAP 168* 164* 136*    PROBLEM LIST:    Active Problems:   Diabetes mellitus type 2 with complications (HCC)   S/P CABG (coronary artery bypass graft)   Peripheral vascular disease, unspecified (HCC)   Essential hypertension   Chronic diastolic heart failure (HCC)   Chronic kidney disease, stage III (moderate) (HCC)   Polyneuropathy associated with underlying disease ([place])   Surgical wound infection   Volume overload   CURRENT MEDS:   . aspirin EC  325 mg Oral q1800  . atorvastatin  20 mg Oral QPM  . cephALEXin  500 mg Oral Q12H  . fenofibrate  54 mg Oral Daily  . ferrous sulfate  325 mg Oral TID WC  . furosemide  40 mg Oral Daily  . gabapentin  300 mg Oral QHS  . gabapentin  600 mg Oral Daily  . heparin  5,000 Units Subcutaneous Q8H  . insulin aspart  0-15 Units Subcutaneous TID WC  . levothyroxine  50 mcg Oral QAC breakfast  . loratadine  10 mg Oral Daily  . metoprolol tartrate  50 mg Oral BID  . multivitamin with minerals  1 tablet Oral Daily   . pantoprazole  40 mg Oral Daily  . pioglitazone  30 mg Oral Q lunch  . ramipril  10 mg Oral Daily  . tamsulosin  0.4 mg Oral Daily    Deitra Mayo Beeper: [phone] Office: [phone] 11/27/2017

## 2017-11-28 LAB — GLUCOSE, CAPILLARY
GLUCOSE-CAPILLARY: 126 mg/dL — AB (ref 70–99)
Glucose-Capillary: 128 mg/dL — ABNORMAL HIGH (ref 70–99)
Glucose-Capillary: 129 mg/dL — ABNORMAL HIGH (ref 70–99)
Glucose-Capillary: 158 mg/dL — ABNORMAL HIGH (ref 70–99)

## 2017-11-28 MED ORDER — DIPHENHYDRAMINE HCL 25 MG PO CAPS
25.0000 mg | ORAL_CAPSULE | Freq: Every evening | ORAL | Status: DC | PRN
  Administered 2017-12-02: 25 mg via ORAL
  Filled 2017-11-28: qty 1

## 2017-11-28 NOTE — Progress Notes (Addendum)
[facility] [place]  Subjective  - Ambulating some with assistance.  No new complaints.  Objective (!) 147/71 67 98.8 F (37.1 C) (Oral) (!) 26 96%  Intake/Output Summary (Last 24 hours) at 11/28/2017 0813 Last data filed at 11/28/2017 0121 Gross per 24 hour  Intake -  Output 800 ml  Net -800 ml   Left LE incisions healing well, groin dressing changed this am per RN, Good granulation tissue per nursing. Palpable left DP, mod edema, elevation while in bed. Heart RRR Lungs non labored breathing  Assessment/Planning: Poorly healing left groin wound s/p irrigation and debridement Wet to dry dressing changes daily 10/25/2017  Left common femoral endarterectomy with profundoplasty, left femoral to below-knee popliteal bypass with non-reversed left greater saphenous vein  Continue daily groin dressing changes, PO Keflex antibiotics.   Mobility daily He is having problems with sleep I will order PRN Benadryl for sleep.  Roxy Horseman 11/28/2017 8:13 AM  I have interviewed the patient and examined the patient. I agree with the findings by the PA. Continue meticulous wound care. Pack central portion of wound gently.  Home when wound shows some more improvement.  On Keflex.   Gae Gallop, MD [phone]

## 2017-11-29 LAB — GLUCOSE, CAPILLARY
GLUCOSE-CAPILLARY: 147 mg/dL — AB (ref 70–99)
GLUCOSE-CAPILLARY: 114 mg/dL — AB (ref 70–99)
GLUCOSE-CAPILLARY: 124 mg/dL — AB (ref 70–99)
Glucose-Capillary: 184 mg/dL — ABNORMAL HIGH (ref 70–99)
Glucose-Capillary: 134 mg/dL — ABNORMAL HIGH (ref 70–99)

## 2017-11-29 NOTE — Progress Notes (Signed)
  Progress Note    11/29/2017 7:55 AM 19 Days Post-Op  Subjective:  Says he wants to get up and stir around but the rule is he needs two people.  He wants me to look at his right foot-says it's starting to look like a foot again.  Tm 99.2 now afebrile HR 50's-70's NSR 270'W-237'S systolic 28% RA  Vitals:   11/29/17 0342 11/29/17 0746  BP: (!) 133/54 (!) 163/61  Pulse: 71 71  Resp:  20  Temp: 97.8 F (36.6 C) 98.4 F (36.9 C)  SpO2: 96% 98%    Physical Exam: Cardiac:  regular Lungs:  Non labored Incisions:  Left groin with bandage in tact. Extremities:  Still with 2+ edema left foot/leg; palpable left DP pulse.  right foot is warm and well perfused;     CBC    Component Value Date/Time   WBC 4.8 11/24/2017 0340   RBC 3.33 (L) 11/24/2017 0340   HGB 10.3 (L) 11/24/2017 0340   HCT 32.9 (L) 11/24/2017 0340   PLT 207 11/24/2017 0340   MCV 98.8 11/24/2017 0340   MCH 30.9 11/24/2017 0340   MCHC 31.3 11/24/2017 0340   RDW 18.7 (H) 11/24/2017 0340   LYMPHSABS 0.7 10/28/2017 2219   MONOABS 0.8 10/28/2017 2219   EOSABS 0.1 10/28/2017 2219   BASOSABS 0.0 10/28/2017 2219    BMET    Component Value Date/Time   NA 137 11/24/2017 0340   K 4.2 11/24/2017 0340   CL 99 11/24/2017 0340   CO2 30 11/24/2017 0340   GLUCOSE 135 (H) 11/24/2017 0340   BUN 22 11/24/2017 0340   CREATININE 1.38 (H) 11/24/2017 0340   CREATININE 1.32 04/18/2012 0949   CALCIUM 8.5 (L) 11/24/2017 0340   GFRNONAA 47 (L) 11/24/2017 0340   GFRAA 55 (L) 11/24/2017 0340    INR    Component Value Date/Time   INR 1.27 11/10/2017 0336     Intake/Output Summary (Last 24 hours) at 11/29/2017 0755 Last data filed at 11/29/2017 3151 Gross per 24 hour  Intake 1520 ml  Output 1050 ml  Net 470 ml     Assessment:  77 y.o. male is s/p:  With poorly healing left groin wound  19 Days Post-Op  Plan: -will change dressing with Dr. Oneida Alar later this morning -otherwise, pt doing well and strength and  endurance continues to improve. -still with swelling left leg-need to continue elevation of leg when in bed.  -continue IV abx  -DVT prophylaxis:  Sq heparin   Leontine Locket, PA-C [facility] [phone] 11/29/2017 7:55 AM

## 2017-11-29 NOTE — Progress Notes (Signed)
Wound care completed to left groin area. Dressing removed in clean fashion. 2 wet-to-dry gauze packed in left groin area with folded dry 4x4s in place with medi-pore tape. Patient tolerated well.

## 2017-11-29 NOTE — Clinical Social Work Note (Signed)
Friday's CSW handoff states that patient will likely discharge to [facility] early this week. SNF admissions coordinator notified and will start insurance authorization.  Dayton Scrape, [place]

## 2017-11-29 NOTE — Progress Notes (Signed)
Physical Therapy Treatment Patient Details Name: [name] MRN: [id number] DOB: 1941/03/03 Today's Date: 11/29/2017    History of Present Illness 77 y.o. patient who underwent left common femoral endarterectomy and left femoral to below-knee popliteal bypass 6/03, discharged to SNF, returns for follow up with left groin wound infection, debrided and wound vac placed 6/14. PMH includes: foot drop L, DM, peripheral neuropathy, PVD, L food wounds, B RTC repair    PT Comments    Pt ambulated in hall with close min guard and chair to follow. He required 3 seated rest brakes secondary to fatigue, and was too fatigued after third brake to stand again. Patient would benefit from continued skilled PT to maximize activity tolerance and functional independence. Will continue to follow acutely.     Follow Up Recommendations  SNF;Supervision/Assistance - 24 hour     Equipment Recommendations  Other (comment)(TBD at SNF)    Recommendations for Other Services       Precautions / Restrictions Precautions Precautions: Fall Restrictions Weight Bearing Restrictions: No LLE Weight Bearing: Weight bearing as tolerated Other Position/Activity Restrictions: Pt with foot drop on L causing occasional stumble    Mobility  Bed Mobility Overal bed mobility: Needs Assistance Bed Mobility: Sit to Supine     Supine to sit: Mod assist     General bed mobility comments: mod A for LE management and to elevate torso. Cues for sequencing and technique  Transfers Overall transfer level: Needs assistance Equipment used: Rolling walker (2 wheeled) Transfers: Sit to/from Stand Sit to Stand: Mod assist         General transfer comment: Sit<>stand performed 4x. mod A to power up and steady once standing. On last sit<>stand attempt, pt very fatigued and unable to come into standing.  Ambulation/Gait Ambulation/Gait assistance: Min guard Gait Distance (Feet): 160 Feet Assistive device: Rolling  walker (2 wheeled) Gait Pattern/deviations: Decreased step length - right;Decreased step length - left;Trunk flexed;Step-through pattern;Decreased dorsiflexion - left Gait velocity: decr   General Gait Details: Very close min guard for safety. PTA pulling WC behind, as pt required 3 seated rest brakes. 1 LOB secondary to foot drop, pt able to correct w/o assist. Heavy reliance on RW for support. Cues for postural control.   Stairs             Wheelchair Mobility    Modified Rankin (Stroke Patients Only)       Balance Overall balance assessment: Needs assistance Sitting-balance support: Feet supported;No upper extremity supported Sitting balance-Leahy Scale: Fair Sitting balance - Comments: UE support sitting EOB   Standing balance support: Bilateral upper extremity supported;During functional activity Standing balance-Leahy Scale: Poor Standing balance comment: walker and min guard assist for static standing once standing established.                             Cognition Arousal/Alertness: Awake/alert Behavior During Therapy: WFL for tasks assessed/performed Overall Cognitive Status: Within Functional Limits for tasks assessed                                        Exercises      General Comments        Pertinent Vitals/Pain Pain Assessment: Faces Faces Pain Scale: Hurts little more Pain Location: lt ankle/calf Pain Descriptors / Indicators: Grimacing;Sore Pain Intervention(s): Monitored during session;Limited activity within patient's tolerance;Repositioned  Home Living                      Prior Function            PT Goals (current goals can now be found in the care plan section) Acute Rehab PT Goals Patient Stated Goal: Decrease pain PT Goal Formulation: With patient Time For Goal Achievement: 12/03/17 Potential to Achieve Goals: Good Progress towards PT goals: Progressing toward goals    Frequency     Min 2X/week      PT Plan Current plan remains appropriate    Co-evaluation              AM-PAC PT "6 Clicks" Daily Activity  Outcome Measure  Difficulty turning over in bed (including adjusting bedclothes, sheets and blankets)?: Unable Difficulty moving from lying on back to sitting on the side of the bed? : Unable Difficulty sitting down on and standing up from a chair with arms (e.g., wheelchair, bedside commode, etc,.)?: Unable Help needed moving to and from a bed to chair (including a wheelchair)?: A Little Help needed walking in hospital room?: A Little Help needed climbing 3-5 steps with a railing? : Total 6 Click Score: 10    End of Session Equipment Utilized During Treatment: Gait belt Activity Tolerance: Patient tolerated treatment well Patient left: with call bell/phone within reach;with nursing/sitter in room;with family/visitor present(in w/c) Nurse Communication: Mobility status PT Visit Diagnosis: Unsteadiness on feet (R26.81);Other abnormalities of gait and mobility (R26.89) Pain - Right/Left: Left Pain - part of body: Hip     Time: 3818-2993 PT Time Calculation (min) (ACUTE ONLY): 44 min  Charges:  $Gait Training: 23-37 mins $Therapeutic Activity: 8-22 mins                    G Codes:       Benjiman Core, [place] Pager [phone] Acute Rehab   Allena Katz 11/29/2017, 1:16 PM

## 2017-11-30 LAB — GLUCOSE, CAPILLARY
GLUCOSE-CAPILLARY: 165 mg/dL — AB (ref 70–99)
Glucose-Capillary: 153 mg/dL — ABNORMAL HIGH (ref 70–99)
Glucose-Capillary: 170 mg/dL — ABNORMAL HIGH (ref 70–99)
Glucose-Capillary: 182 mg/dL — ABNORMAL HIGH (ref 70–99)

## 2017-11-30 NOTE — Progress Notes (Signed)
Nutrition Follow Up  DOCUMENTATION CODES:   Obesity unspecified  INTERVENTION:    Atkins Shakes po TID, each supplement provides 160 kcal and 15 grams of protein  NUTRITION DIAGNOSIS:   Increased nutrient needs related to wound healing as evidenced by estimated needs, onoging  GOAL:   Patient will meet greater than or equal to 90% of their needs, progressing  MONITOR:   PO intake, Supplement acceptance, Labs, Weight trends  ASSESSMENT:   77 yo male post left common femoral endarterectomy and left femoral to below-knee popliteal bypass with vein 10 days ago admitted with poorly healing left groin. Pt with hx of DM, CAD s/p CABG, HTN, CHF, CKD III, PAD  Pt s/p procedure 6/14: DEBRIDEMENT WOUND LEFT GROIN   Pt's average PO intake is 60% per flowsheet records. Continues to receive L groin wound care. His family has been bringing food from home. Labs and medications reviewed. CBG's [id number].  Diet Order:   Diet Order           Diet regular Room service appropriate? Yes; Fluid consistency: Thin  Diet effective now         EDUCATION NEEDS:   Not appropriate for education at this time  Skin:  Skin Assessment: Skin Integrity Issues: Skin Integrity Issues:: Wound VAC, Other (Comment) Wound Vac: groin Other: venous stasis ulcers on foot, toe  Last BM:  7/9  Height:   Ht Readings from Last 1 Encounters:  11/05/17 5\' 11"  (1.803 m)    Weight:   Wt Readings from Last 1 Encounters:  11/29/17 257 lb 0.9 oz (116.6 kg)    Ideal Body Weight:  78.2 kg  BMI:  Body mass index is 35.85 kg/m.  Estimated Nutritional Needs:   Kcal:  2000-2200  Protein:  115-130 gm  Fluid:  >/= 2 L  Arthur Holms, RD, LDN Pager #: [phone] After-Hours Pager #: [phone]

## 2017-11-30 NOTE — Progress Notes (Signed)
Occupational Therapy Treatment Patient Details Name: [name] MRN: [id number] DOB: [birth year] Today's Date: 11/30/2017    History of present illness 77 y.o. patient who underwent left common femoral endarterectomy and left femoral to below-knee popliteal bypass 6/03, discharged to SNF, returns for follow up with left groin wound infection, debrided and wound vac placed 6/14. PMH includes: foot drop L, DM, peripheral neuropathy, PVD, L food wounds, B RTC repair   OT comments  Pt progressing towards established OT goals. Pt performing functional mobility in room and in hallway with Min A and RW for balance. Pt continues to demonstrate decreased endurance and activity toelrance. Pt requiring Min A for balance while performing hand hygiene at sink. Continue to recommend dc to SNF and will continue to follow acutely as admitted.   Follow Up Recommendations  SNF;Supervision/Assistance - 24 hour    Equipment Recommendations  Other (comment)(Defer to next venue)    Recommendations for Other Services PT consult    Precautions / Restrictions Precautions Precautions: Fall Restrictions Weight Bearing Restrictions: No LLE Weight Bearing: Weight bearing as tolerated Other Position/Activity Restrictions: Pt with foot drop on L causing occasional stumble       Mobility Bed Mobility Overal bed mobility: Needs Assistance Bed Mobility: Sit to Supine     Supine to sit: Min assist Sit to supine: Mod assist   General bed mobility comments: Min A to elevate trunk form supine. Mod A for managing BLEs  Transfers Overall transfer level: Needs assistance Equipment used: Rolling walker (2 wheeled) Transfers: Sit to/from Stand Sit to Stand: Mod assist;+2 physical assistance;From elevated surface         General transfer comment: Mod A +2 to power up into standing with bilateral feet blocked    Balance Overall balance assessment: Needs assistance Sitting-balance support: Feet  supported;No upper extremity supported Sitting balance-Leahy Scale: Fair Sitting balance - Comments: UE support sitting EOB   Standing balance support: Bilateral upper extremity supported;During functional activity Standing balance-Leahy Scale: Poor Standing balance comment: walker and min guard assist for static standing once standing established.                            ADL either performed or assessed with clinical judgement   ADL Overall ADL's : Needs assistance/impaired     Grooming: Wash/dry hands;Standing;Minimal assistance Grooming Details (indicate cue type and reason): Pt leaning forward on sink during hand hygiene. During task, pt asking "what do you want me to do now?" Requiring MIn A for standing balance                     Toileting- Clothing Manipulation and Hygiene: Minimal assistance;Sit to/from stand Toileting - Clothing Manipulation Details (indicate cue type and reason): Pt performing urination standing at toilet with Min A for standing balance.      Functional mobility during ADLs: Minimal assistance;Rolling walker General ADL Comments: Pt performing funcitonal mobiltiy to bathroom for toileting and then in hallway to follow linens for hand hygiene at sink. Pt requiring Min A thorughout for balance. Pt requiring increased cues during transitions with pt asking for comfirmation of tasks.      Vision       Perception     Praxis      Cognition Arousal/Alertness: Awake/alert Behavior During Therapy: WFL for tasks assessed/performed Overall Cognitive Status: Impaired/Different from baseline Area of Impairment: Safety/judgement;Problem solving;Memory  Memory: Decreased short-term memory Following Commands: Follows one step commands with increased time;Follows multi-step commands with increased time Safety/Judgement: Decreased awareness of safety Awareness: Emergent Problem Solving: Requires verbal  cues;Decreased initiation General Comments: During ADLs, pt stating "what do I do now" after reach part of a task. Providing questions to challenge pt problem solving and awarness. Pt presenting with decreased awareness. During fucnitonal mobiltiy, pt passing his room despite Min vcs from family. Pt requiring direct VCs to realize he had passed his room and then to locate his room.         Exercises     Shoulder Instructions       General Comments VSS. noting bruising at top of bottom; RN notified    Pertinent Vitals/ Pain       Pain Assessment: Faces Faces Pain Scale: Hurts little more Pain Location: lt ankle/calf Pain Descriptors / Indicators: Grimacing;Sore Pain Intervention(s): Monitored during session;Limited activity within patient's tolerance;Repositioned  Home Living                                          Prior Functioning/Environment              Frequency  Min 2X/week        Progress Toward Goals  OT Goals(current goals can now be found in the care plan section)  Progress towards OT goals: Progressing toward goals  Acute Rehab OT Goals Patient Stated Goal: Decrease pain OT Goal Formulation: With patient Time For Goal Achievement: 12/07/17 Potential to Achieve Goals: Good ADL Goals Pt Will Perform Grooming: with set-up;with supervision;sitting Pt Will Perform Upper Body Dressing: with set-up;with supervision;sitting Pt Will Perform Lower Body Dressing: sit to/from stand;with min guard assist;with adaptive equipment Pt Will Transfer to Toilet: with min guard assist;ambulating;bedside commode Pt Will Perform Toileting - Clothing Manipulation and hygiene: with min guard assist;sit to/from stand Additional ADL Goal #1: Pt will perform bed mobility with Min Guard A in preparation for ADLs  Plan Discharge plan remains appropriate    Co-evaluation                 AM-PAC PT "6 Clicks" Daily Activity     Outcome Measure   Help  from another person eating meals?: None Help from another person taking care of personal grooming?: A Little Help from another person toileting, which includes using toliet, bedpan, or urinal?: A Lot Help from another person bathing (including washing, rinsing, drying)?: A Lot Help from another person to put on and taking off regular upper body clothing?: A Little Help from another person to put on and taking off regular lower body clothing?: A Lot 6 Click Score: 16    End of Session Equipment Utilized During Treatment: Gait belt;Rolling walker  OT Visit Diagnosis: Unsteadiness on feet (R26.81);Other abnormalities of gait and mobility (R26.89);Muscle weakness (generalized) (M62.81);Pain Pain - Right/Left: Left Pain - part of body: Leg   Activity Tolerance Patient tolerated treatment well   Patient Left in chair;with call bell/phone within reach   Nurse Communication Mobility status;Patient requests pain meds        Time: 5284-1324 OT Time Calculation (min): 29 min  Charges: OT General Charges $OT Visit: 1 Visit OT Treatments $Self Care/Home Management : 23-37 mins  [place], OTR/L Acute Rehab Pager: [phone] Office: [place] 11/30/2017, 5:42 PM

## 2017-11-30 NOTE — Progress Notes (Signed)
[facility]  Subjective  - feels ok   Objective (!) 135/50 62 98.7 F (37.1 C) (Oral) 19 94%  Intake/Output Summary (Last 24 hours) at 11/30/2017 0827 Last data filed at 11/30/2017 0431 Gross per 24 hour  Intake 480 ml  Output 1000 ml  Net -520 ml   Left foot 2+ DP pulse dry gangrene of toe, ankle wound healing Groin wound 10% fibrinous exudate mainly central portion 90% healthy granulation  Assessment/Planning: Continue antibiotics Continue local wound care May be ready for SNF by Friday if wound continues to shrink down Continue to assess for suitability for [facility] but too many crevices currently  Ruta Hinds 11/30/2017 8:27 AM --  Laboratory Lab Results: No results for input(s): WBC, HGB, HCT, PLT in the last 72 hours. BMET No results for input(s): NA, K, CL, CO2, GLUCOSE, BUN, CREATININE, CALCIUM in the last 72 hours.  COAG Lab Results  Component Value Date   INR 1.27 11/10/2017   INR 1.20 11/04/2017   INR 1.13 10/21/2017   No results found for: PTT

## 2017-11-30 NOTE — Progress Notes (Signed)
   11/30/17 1030  What Happened  Was fall witnessed? Yes  Who witnessed fall? A Unice Bailey  Patients activity before fall ambulating-assisted  Point of contact head;buttocks  Was patient injured? Yes (skin tear to bottom)  Follow Up  MD notified Dr. Oneida Alar  Time MD notified [phone]  Additional tests No  Simple treatment Dressing  Adult Fall Risk Assessment  Risk Factor Category (scoring not indicated) High fall risk per protocol (document High fall risk);Fall has occurred during this admission (document High fall risk)  Patient's Fall Risk High Fall Risk (>13 points)  Adult Fall Risk Interventions  Required Bundle Interventions *See Row Information* High fall risk - low, moderate, and high requirements implemented  Additional Interventions Lap belt while in chair/wheelchair;Room near nurses station;Use of appropriate toileting equipment (bedpan, BSC, etc.)  Screening for Fall Injury Risk (To be completed on HIGH fall risk patients) - Assessing Need for Low Bed  Risk For Fall Injury- Low Bed Criteria None identified - Continue screening  Screening for Fall Injury Risk (To be completed on HIGH fall risk patients who do not meet crieteria for Low Bed) - Assessing Need for Floor Mats Only  Risk For Fall Injury- Criteria for Floor Mats None identified - No additional interventions needed  Vitals  ECG Heart Rate (!) 59  Resp 15  Pain Assessment  Pain Scale 0-10  Pain Score 3  Pain Type Acute pain  Pain Location Buttocks  Pain Descriptors / Indicators Sore  Pain Frequency Intermittent  Pain Onset With Activity  Pain Intervention(s) RN made aware;Repositioned  Multiple Pain Sites No  Neurological  Neuro (WDL) WDL  Level of Consciousness Alert  Orientation Level Oriented X4  Cognition Appropriate at baseline;Appropriate attention/concentration;Appropriate judgement;Appropriate safety awareness;Appropriate for developmental age;Follows commands  Speech Clear  R Hand Grip  Present;Moderate  L Hand Grip Present;Moderate   RUE Motor Response Purposeful movement;Responds to commands  LUE Motor Response Purposeful movement;Responds to commands  RLE Motor Response Purposeful movement;Responds to commands  LLE Motor Response Purposeful movement;Responds to commands

## 2017-11-30 NOTE — Progress Notes (Signed)
I came to check on patient after hearing of his fall.  He states he fell onto his R buttock from a standing position.  He has only mild soreness R buttock/flank which is reproducible on physical exam.  He would like to participate in physical therapy today and see if this area remains sore overnight before agreeing to XR.  Dagoberto Ligas, PA-C [facility] [phone] 11/30/2017  2:58 PM

## 2017-11-30 NOTE — Progress Notes (Signed)
   I was called to evaluate the patient secondary to fall while standing at the vanity this morning.  He states he was not holding on to anything and the RN was getting a wash cloth and tooth brush.  He landed on his buttocks and fell back hitting his head on the wall as well as his left hip on the foot peg of the computer stand.   He is at his baseline as far as mobility and leaning right and left in a seated position does not elicite increased hip pain.  His pupils are equal and occular motion is symmetric.  Motor is intact and equal B UE on MMT.    We will observe his daily activity.  I do not feel this fall caused any new trauma to the patient.   Roxy Horseman PA-C

## 2017-11-30 NOTE — Care Management Important Message (Signed)
Important Message  Patient Details  Name: TREYSHAWN MULDREW MRN: [id number] Date of Birth: 10-May-1941   Medicare Important Message Given:  Yes    Shields Pautz P Shelia Magallon 11/30/2017, 2:16 PM

## 2017-12-01 ENCOUNTER — Inpatient Hospital Stay (HOSPITAL_COMMUNITY): Payer: Medicare HMO

## 2017-12-01 LAB — GLUCOSE, CAPILLARY
GLUCOSE-CAPILLARY: 123 mg/dL — AB (ref 70–99)
GLUCOSE-CAPILLARY: 138 mg/dL — AB (ref 70–99)
Glucose-Capillary: 118 mg/dL — ABNORMAL HIGH (ref 70–99)
Glucose-Capillary: 211 mg/dL — ABNORMAL HIGH (ref 70–99)
Glucose-Capillary: 140 mg/dL — ABNORMAL HIGH (ref 70–99)

## 2017-12-01 NOTE — Discharge Summary (Signed)
Discharge Summary    [name] [birth year] 77 y.o. male  [id number]  Admission Date: 11/04/2017  Discharge Date: 12/03/17  Physician: Elam Dutch, MD  Admission Diagnosis: infected lt groing wounds    HPI:   This is a 77 y.o. male  who returns for postoperative follow-up today.  He underwent left common femoral endarterectomy and left femoral to below-knee popliteal bypass with vein 10 days ago.  [facility].  He is having some bloody drainage from his left groin.  He denies any fever or chills.  He is incontinent of urine and stool frequently.   Hospital Course:  The patient was admitted to the hospital and placed on broad spectrum iv abx.    He was taken to the operating room on 11/05/2017 and underwent: Excisional debridement of left groin placement of VAC dressing   Operative findings: Fat necrosis small hematoma left groin.     The pt tolerated the procedure well and was transported to the PACU in good condition.   By POD 1, he was doing well.  Stressed protein for wound healing.    Wound cx with moderate GNR and zosyn was continued and vancomycin d/c'd.    By POD 3, his left groin still had wound with some areas of poorly healing fat necrosis.  Plan for OR for further debridement.   Stress nutrition. Continued to have palpable left DP pulse.  He has chronic swelling in the left leg-he declined TED hose.  Encouraged eg elevation.  His wife is updated that the pt is at very high risk for graft infection, limb loss and even death if aortic graft becomes infected.  His vac was discontinued and wet to dry dressings restarted for now.   On POD 4, the pt's albumin & prealbumin were decreased and still with moderate to severe protein calorie nutrition.  Continue supplement.    On 11/10/17, pt was taken to the OR and underwent Debridement left groin, placement of VAC dressing.   Operative findings: Areas of fat necrosis no  purulent material noted debrided back to healthy appearing tissue  TRH was consulted to assist with medical management.  IV abx continued.   On 11/12/17, pt did have some urinary retention and foley catheter was placed.  Urology was consulted.  He is followed by Dr. Dorina Hoyer.  He was started on Flomax.  Voiding trial eventually obtained and pt did well.     Wound Culture grew E coli and Klebsiella and was sensitive to Zosyn.   His cx was sensitive to Keflex and he was started on this.    Mild acute on chronic diastolic heart failure-continuing lasix daily.  Creatinine stable at 1.3.  Pt's ambulation steadily progressed throughout his hospital stay.    On 11/17/17, wound vac removed and wound inspected.  There was a significant amount of serous fluid but no pus.  There was a tract that undermined somewhat up to the inguinal ligament but no graft exposed on exam by Dr. Donnetta Hutching.  The vac not appropriate at this time and wet to dry dressings were restarted.    It was recommended by the medical service to continue lasix, metoprolol, aspirin and ramipril.  However, his creatinine jumped a little to 1.59 and therefore the ACEI was discontinued at discharge.   On 11/18/17, every other staple was removed from his leg incisions.   On 11/19/17, still with complex high risk left groin wound.  Wet to dry dressings  continued.    On 11/24/17, albumin 2.3-slowly rising.  He is started on an iron supplement.  There is no real excessive drainage at this point-continue wet to dry dressing changes.   On 11/26/17, bypass continues to be patent with palpable left DP pulse.  Left groin wound inspected and still with central cavity but smaller.  Some fibrinous exudate but 90% granulation.    On 11/30/17, the pt pt fell onto his right buttocks from standing position.  He only had mild soreness on the right buttock/flank that was reproducible on physical exam.  On 12/01/17, pt was more sore and therefore a pelvic xray was obtained  and was negative.    On 7/11-12/19, wound continuing to heal but still tenuous.  Continue wet to dry dressing changes at SNF.    Will need meticulous wound care as he has aortic graft in his groin.  Continue antibiotics and meticulous wound care (wet to dry saline dressing changes bid) to help prevention of infection of aortic graft.   CBC    Component Value Date/Time   WBC 5.1 12/02/2017 0340   RBC 3.44 (L) 12/02/2017 0340   HGB 10.5 (L) 12/02/2017 0340   HCT 34.9 (L) 12/02/2017 0340   PLT 206 12/02/2017 0340   MCV 101.5 (H) 12/02/2017 0340   MCH 30.5 12/02/2017 0340   MCHC 30.1 12/02/2017 0340   RDW 17.7 (H) 12/02/2017 0340   LYMPHSABS 0.7 10/28/2017 2219   MONOABS 0.8 10/28/2017 2219   EOSABS 0.1 10/28/2017 2219   BASOSABS 0.0 10/28/2017 2219    BMET    Component Value Date/Time   NA 137 12/02/2017 0340   K 4.2 12/02/2017 0340   CL 101 12/02/2017 0340   CO2 26 12/02/2017 0340   GLUCOSE 121 (H) 12/02/2017 0340   BUN 24 (H) 12/02/2017 0340   CREATININE 1.59 (H) 12/02/2017 0340   CREATININE 1.32 04/18/2012 0949   CALCIUM 8.7 (L) 12/02/2017 0340   GFRNONAA 40 (L) 12/02/2017 0340   GFRAA 46 (L) 12/02/2017 0340      Discharge Instructions    Discharge patient   Complete by:  As directed    Discharge disposition:  03-[facility]   Discharge patient date:  12/03/2017      Discharge Diagnosis:  infected lt groing wounds   Secondary Diagnosis: Patient Active Problem List   Diagnosis Date Noted  . Volume overload 11/10/2017  . Surgical wound infection 11/04/2017  . PAD (peripheral artery disease) ([place]) 10/25/2017  . Polyneuropathy associated with underlying disease ([place]) 08/16/2017  . Essential hypertension 02/06/2014  . Chronic diastolic heart failure ([place]) 02/06/2014  . Chronic kidney disease, stage III (moderate) ([place]) 02/06/2014  . Aftercare following surgery of the circulatory system, [place] 01/25/2014  . Carotid stenosis 06/08/2013  . Stricture  of artery ([place]) 04/14/2012  . Peripheral vascular disease, unspecified ([place]) 10/08/2011  . DVT of upper extremity (deep vein thrombosis), left 09/29/2011  . S/P CABG (coronary artery bypass graft) 09/29/2011  . H/O carotid endarterectomy 09/29/2011  . Occlusion and stenosis of carotid artery without mention of cerebral infarction 06/11/2011  . Diabetes mellitus type 2 with complications ([place]) 42/68/3419   Past Medical History:  Diagnosis Date  . Arthritis   . Atherosclerotic heart disease   . Back pain   . Bruises easily    takes Pletal daily and ASA 358m daily  . Cataract immature    right  . Chest pain   . Colon polyps   . Complication of  anesthesia    hard to wake up with bypass surgery  . Cyst    on perineum;takes Doxycycline daily  . Diabetes mellitus    takes Glipizide,Metformin,and Actos daily  . Diarrhea   . Dyspnea    when lying flat  . Edema    right leg knee down swollen since fall 3 wks ago  . Foot drop, left   . GERD (gastroesophageal reflux disease)    Rolaids as needed-occ reflux  . Hemorrhoids   . History of kidney stones    at age 57   . Hyperlipidemia    takes Tricor and Lipitor daily  . Hypertension    takes Altace,Amlodipine,and Nadolol daily  . Hypothyroidism   . Kidney stone    35+yrs ago  . Muscle pain   . Nasal polyps    hx of  . Peripheral edema    wears knee length hose-told by podiatrist to wear these  . Peripheral neuropathy    takes Gabapentin daily  . Pneumonia    as a child  . PVD (peripheral vascular disease) ([place])   . Renal insufficiency   . Seasonal allergies    takes Mucinex and Zyrtec prn  . Urinary frequency    urgency/takes Vesicare daily     Allergies as of 12/03/2017      Reactions   Amoxicillin-pot Clavulanate Nausea Only   Has patient had a PCN reaction causing immediate rash, facial/tongue/throat swelling, SOB or lightheadedness with hypotension: No Has patient had a PCN reaction causing severe rash involving  mucus membranes or skin necrosis: No Has patient had a PCN reaction that required hospitalization: No Has patient had a PCN reaction occurring within the last 10 years: No If all of the above answers are "NO", then may proceed with Cephalosporin use.   Morphine And Related Other (See Comments)   hallucinates      Medication List    STOP taking these medications   AMBULATORY NON FORMULARY MEDICATION   feeding supplement (GLUCERNA SHAKE) Liqd   ramipril 10 MG capsule Commonly known as:  ALTACE     TAKE these medications   aspirin 325 MG EC tablet Take 325 mg by mouth every evening.   atorvastatin 20 MG tablet Commonly known as:  LIPITOR Take 20 mg by mouth every evening.   cephALEXin 500 MG capsule Commonly known as:  KEFLEX Take 1 capsule (500 mg total) by mouth every 12 (twelve) hours.   cetirizine 10 MG tablet Commonly known as:  ZYRTEC ALLERGY Take 1 tablet (10 mg total) by mouth daily as needed for allergies.   erythromycin ophthalmic ointment USE A SMALL AMOUNT IN THE LEFT EYE DAILY AS NEEDED FOR IRRITATION   fenofibrate 145 MG tablet Commonly known as:  TRICOR Take 145 mg by mouth every morning.   ferrous sulfate 325 (65 FE) MG tablet Take 1 tablet (325 mg total) by mouth 3 (three) times daily with meals.   furosemide 40 MG tablet Commonly known as:  LASIX Take 1 tablet (40 mg total) by mouth daily. What changed:    when to take this  reasons to take this   gabapentin 300 MG capsule Commonly known as:  NEURONTIN Take 1 capsule (300 mg total) by mouth at bedtime. Take 600 mg every morning and 300 mg every night What changed:    how much to take  when to take this   HYDROcodone-acetaminophen 5-325 MG tablet Commonly known as:  NORCO/VICODIN Take 1 tablet by mouth every 6 (  six) hours as needed for severe pain. What changed:  additional instructions   levothyroxine 50 MCG tablet Commonly known as:  SYNTHROID, LEVOTHROID Take 50 mcg by mouth daily  before breakfast.   loperamide 2 MG tablet Commonly known as:  IMODIUM A-D Take 2 mg by mouth daily.   methylcellulose 1 % ophthalmic solution Commonly known as:  ARTIFICIAL TEARS Place 1 drop into both eyes 2 (two) times daily as needed (dry eyes).   metoprolol tartrate 50 MG tablet Commonly known as:  LOPRESSOR Take 1 tablet (50 mg total) by mouth 2 (two) times daily.   multivitamin with minerals Tabs tablet Take 1 tablet by mouth daily.   nitroGLYCERIN 0.4 MG SL tablet Commonly known as:  NITROSTAT PLACE 1 TABLET UNDER TONGUE EVERY 5 MINUTES AS NEEDED FOR CHEST PAIN.   pioglitazone 30 MG tablet Commonly known as:  ACTOS Take 1 tablet (30 mg total) by mouth daily with lunch. What changed:  when to take this   potassium chloride SA 20 MEQ tablet Commonly known as:  K-DUR,KLOR-CON Take 20 mEq by mouth daily as needed (with Lasix).   pseudoephedrine-guaifenesin 60-600 MG 12 hr tablet Commonly known as:  MUCINEX D Take 1 tablet by mouth every 12 (twelve) hours as needed for congestion.   senna-docusate 8.6-50 MG tablet Commonly known as:  Senokot-S Take 1 tablet by mouth at bedtime as needed for mild constipation.   sodium chloride 0.65 % Soln nasal spray Commonly known as:  OCEAN Place 1 spray into both nostrils 2 (two) times daily as needed for congestion.   tamsulosin 0.4 MG Caps capsule Commonly known as:  FLOMAX Take 1 capsule (0.4 mg total) by mouth daily.   temazepam 15 MG capsule Commonly known as:  RESTORIL Take 15 mg by mouth at bedtime as needed for sleep.   UNABLE TO FIND Apply 1 application topically every 6 (six) hours as needed (pain). Med Name: *Ibuprofen 15% Baclofen 1% Lidocaine 10% Gabapentin 3% Topical Cream** To wrist   VICKS SINEX 0.05 % nasal spray Generic drug:  oxymetazoline Place 1 spray into both nostrils 2 (two) times daily as needed for congestion.   VITAMIN B 12 PO Take 500 mcg by mouth daily.       Prescriptions  given: Vicodin #20 No Refill  New medications started/changed: 1.  Keflex 521m bid 2.  Ferrous sulfate 3233mtid with meals 3.  Multivitamin daily 4.  Flomax 0.63m63maily 5.  Lasix 63m83mily (changed from prn) 6.  Sliding scale insulin per facility protocol.  Instructions: 1.  Wet to dry saline dressing changes to left groin bid.  Will need meticulous wound care as he has aortic graft in his groin.  Continue antibiotics and meticulous wound care (wet to dry saline dressing changes bid) to help prevention of infection of aortic graft.  2.  Continue Atkins shakes tid and encourage nutrition for wound healing 3.  Will need to continue on a sliding scale insulin per SNF protocol.  Disposition: SNF  Patient's condition: is Good  Follow up: 1. Dr. FielOneida Alar1 week 2. Dr. DahlDiona Fanti1 week s/p foley catheter and voiding trial (placed on Flomax during admission) 3. Dr. GrifLaurann Montanathe next week to check CBC/CMP/albumin to evaluate renal function, DM, nutritional status, etc.  OT recommendations:  12/02/17 1113  OT Visit Information  Last OT Received On 12/02/17  Assistance Needed +2 (Can be done with +1, but +2 helpful as pt fatigues)  History of Present  Illness 77 y.o. patient who underwent left common femoral endarterectomy and left femoral to below-knee popliteal bypass 6/03, discharged to SNF, returns for follow up with left groin wound infection, debrided and wound vac placed 6/14. PMH includes: foot drop L, DM, peripheral neuropathy, PVD, L food wounds, B RTC repair  Precautions  Precautions Fall  Pain Assessment  Pain Assessment Faces  Faces Pain Scale 6  Pain Location R buttocks  Pain Descriptors / Indicators Grimacing;Sore  Pain Intervention(s) Monitored during session;Repositioned  Cognition  Arousal/Alertness Awake/alert  Behavior During Therapy WFL for tasks assessed/performed  Overall Cognitive Status Impaired/Different from baseline  Area of Impairment  Safety/judgement;Problem solving;Memory  Memory Decreased short-term memory  Following Commands Follows one step commands with increased time;Follows multi-step commands with increased time  Safety/Judgement Decreased awareness of safety  Awareness Emergent  Problem Solving Requires verbal cues;Decreased initiation  Upper Extremity Assessment  Upper Extremity Assessment Overall WFL for tasks assessed  Lower Extremity Assessment  Lower Extremity Assessment Defer to PT evaluation  ADL  Overall ADL's  Needs assistance/impaired  Upper Body Dressing  Minimal assistance;Sitting  Toilet Transfer Moderate assistance;+2 for physical assistance;Stand-pivot;BSC;RW  Toileting- Clothing Manipulation and Hygiene Total assistance;Sit to/from stand  Functional mobility during ADLs Minimal assistance;Rolling walker  Bed Mobility  Overal bed mobility Needs Assistance  Bed Mobility Supine to Sit  Supine to sit Min assist;HOB elevated  General bed mobility comments Increased time and effort, min assist for LEs to EOB and light assist for trunk elevation to sitting  Balance  Overall balance assessment Needs assistance  Sitting-balance support Feet supported;No upper extremity supported  Sitting balance-Leahy Scale Fair  Sitting balance - Comments UE support sitting EOB  Standing balance support Bilateral upper extremity supported;During functional activity  Standing balance-Leahy Scale Poor  Standing balance comment walker and min guard assist for static standing once standing established.   Restrictions  Weight Bearing Restrictions No  LLE Weight Bearing WBAT  Other Position/Activity Restrictions Pt with foot drop on L causing occasional stumble  Transfers  Overall transfer level Needs assistance  Equipment used Rolling walker (2 wheeled)  Transfers Sit to/from Stand;Stand Pivot Transfers  Sit to Stand +2 physical assistance;From elevated surface;Mod assist  Stand pivot transfers Min assist;+2  safety/equipment  General transfer comment Mod A +2 to power up into standing with bilateral feet blocked. Min assist for stand pviot transfer EOB>BSC. Increased time and effort throughout  OT - End of Session  Equipment Utilized During Treatment Gait belt;Rolling walker  Activity Tolerance Patient tolerated treatment well  Patient left Other (comment) (walking with PT)  OT Assessment/Plan  OT Plan Discharge plan remains appropriate  OT Visit Diagnosis Unsteadiness on feet (R26.81);Other abnormalities of gait and mobility (R26.89);Muscle weakness (generalized) (M62.81);Pain  Pain - Right/Left Right  Pain - part of body  (buttocks)  OT Frequency (ACUTE ONLY) Min 2X/week  Follow Up Recommendations SNF;Supervision/Assistance - 24 hour  OT Equipment Other (comment) (TBD at next venue)  AM-PAC OT "6 Clicks" Daily Activity Outcome Measure  Help from another person eating meals? 4  Help from another person taking care of personal grooming? 3  Help from another person toileting, which includes using toliet, bedpan, or urinal? 2  Help from another person bathing (including washing, rinsing, drying)? 2  Help from another person to put on and taking off regular upper body clothing? 3  Help from another person to put on and taking off regular lower body clothing? 2  6 Click Score 16  ADL G Code  Conversion CK  OT Goal Progression  Progress towards OT goals Progressing toward goals  Acute Rehab OT Goals  Patient Stated Goal Decrease pain  OT Goal Formulation With patient   PT recommendations/evaluation 12/02/17: Precautions / Restrictions Precautions Precautions: Fall Restrictions Weight Bearing Restrictions: No LLE Weight Bearing: Weight bearing as tolerated Other Position/Activity Restrictions: Pt with foot drop on L causing occasional stumble    Mobility  Bed Mobility Overal bed mobility: Needs Assistance Bed Mobility: Supine to Sit Supine to sit: Min assist;HOB elevated General  bed mobility comments: Increased time and effort, min assist for LEs to EOB and light assist for trunk elevation to sitting  Transfers Overall transfer level: Needs assistance Equipment used: Rolling walker (2 wheeled) Transfers: Sit to/from Stand Sit to Stand: +2 physical assistance;From elevated surface;Mod assist  General transfer comment: Assist to bring hips up and for balance. Pt with posterior bias when rising.  Ambulation/Gait Ambulation/Gait assistance: Min guard Gait Distance (Feet): 200 Feet Assistive device: Rolling walker (2 wheeled) Gait Pattern/deviations: Decreased step length - right;Decreased step length - left;Trunk flexed;Step-through pattern;Decreased dorsiflexion - left Gait velocity: decr Gait velocity interpretation: <1.31 ft/sec, indicative of household ambulator General Gait Details: min assist for safety and balance. Pt took 3-4 brief standing rest breaks. Verbal cues to stand more erect   Stairs   Wheelchair Mobility  Modified Rankin (Stroke Patients Only)     Balance Overall balance assessment: Needs assistance Sitting-balance support: Feet supported;No upper extremity supported Sitting balance-Leahy Scale: Fair Standing balance support: Bilateral upper extremity supported;During functional activity Standing balance-Leahy Scale: Poor Standing balance comment: walker and min guard assist for static standing once standing established.     Cognition Arousal/Alertness: Awake/alert Behavior During Therapy: WFL for tasks assessed/performed Overall Cognitive Status: Impaired/Different from baseline Area of Impairment: Safety/judgement;Problem solving;Memory Memory: Decreased short-term memory Following Commands: Follows one step commands with increased time;Follows multi-step commands with increased time Safety/Judgement: Decreased awareness of safety Awareness: Emergent Problem Solving: Requires verbal cues;Decreased initiation      Exercises    General Comments      Pertinent Vitals/Pain Pain Assessment: Faces Faces Pain Scale: Hurts even more Pain Location: R buttocks Pain Descriptors / Indicators: Grimacing;Sore Pain Intervention(s): Monitored during session;Repositioned    Home Living      Prior Function       PT Goals (current goals can now be found in the care plan section) Acute Rehab PT Goals Patient Stated Goal: Decrease pain PT Goal Formulation: With patient Time For Goal Achievement: 12/16/17 Potential to Achieve Goals: Good Progress towards PT goals: Goals met and updated - see care plan;Progressing toward goals    Frequency   Min 2X/week      PT Plan Current plan remains appropriate    Co-evaluation    AM-PAC PT "6 Clicks" Daily Activity  Outcome Measure  Difficulty turning over in bed (including adjusting bedclothes, sheets and blankets)?: Unable Difficulty moving from lying on back to sitting on the side of the bed? : Unable Difficulty sitting down on and standing up from a chair with arms (e.g., wheelchair, bedside commode, etc,.)?: Unable Help needed moving to and from a bed to chair (including a wheelchair)?: A Lot Help needed walking in hospital room?: A Little Help needed climbing 3-5 steps with a railing? : Total 6 Click Score: 9      Leontine Locket, PA-C [facility] [phone] 12/03/2017  7:45 AM

## 2017-12-01 NOTE — Progress Notes (Signed)
Physical Therapy Treatment Patient Details Name: [name] MRN: [id number] DOB: 03-22-1941 Today's Date: 12/01/2017    History of Present Illness 77 y.o. patient who underwent left common femoral endarterectomy and left femoral to below-knee popliteal bypass 6/03, discharged to SNF, returns for follow up with left groin wound infection, debrided and wound vac placed 6/14. PMH includes: foot drop L, DM, peripheral neuropathy, PVD, L food wounds, B RTC repair    PT Comments    Pt admitted with above diagnosis. Pt currently with functional limitations due to balance and endurance deficits. Pt was able to ambulate with min guard assist with RW.  STill needs mod assist for sit to stand.  Safety is still an issue as well with pt unsteady at times and cues for postural control and walker safety.  Pt will benefit from skilled PT to increase their independence and safety with mobility to allow discharge to the venue listed below.     Follow Up Recommendations  SNF;Supervision/Assistance - 24 hour     Equipment Recommendations  Other (comment)(TBD at SNF)    Recommendations for Other Services       Precautions / Restrictions Precautions Precautions: Fall Restrictions Weight Bearing Restrictions: No LLE Weight Bearing: Weight bearing as tolerated Other Position/Activity Restrictions: Pt with foot drop on L causing occasional stumble    Mobility  Bed Mobility               General bed mobility comments: in chair on arrival  Transfers Overall transfer level: Needs assistance Equipment used: Rolling walker (2 wheeled) Transfers: Sit to/from Stand Sit to Stand: +2 physical assistance;From elevated surface;Mod assist         General transfer comment: Mod A +2 to power up into standing with bilateral feet blocked  Ambulation/Gait Ambulation/Gait assistance: Min guard Gait Distance (Feet): 130 Feet Assistive device: Rolling walker (2 wheeled) Gait Pattern/deviations:  Decreased step length - right;Decreased step length - left;Trunk flexed;Step-through pattern;Decreased dorsiflexion - left Gait velocity: decr Gait velocity interpretation: <1.31 ft/sec, indicative of household ambulator General Gait Details: Very close min guard for safety. Rehab tech pulling WC behind, however pt did not need  seated rest breaks. Heavy reliance on RW for support as pt fatigued. Cues for postural control.   Stairs             Wheelchair Mobility    Modified Rankin (Stroke Patients Only)       Balance Overall balance assessment: Needs assistance Sitting-balance support: Feet supported;No upper extremity supported Sitting balance-Leahy Scale: Fair Sitting balance - Comments: UE support sitting EOB   Standing balance support: Bilateral upper extremity supported;During functional activity Standing balance-Leahy Scale: Poor Standing balance comment: walker and min guard assist for static standing once standing established.                             Cognition Arousal/Alertness: Awake/alert Behavior During Therapy: WFL for tasks assessed/performed Overall Cognitive Status: Impaired/Different from baseline Area of Impairment: Safety/judgement;Problem solving;Memory                     Memory: Decreased short-term memory Following Commands: Follows one step commands with increased time;Follows multi-step commands with increased time Safety/Judgement: Decreased awareness of safety Awareness: Emergent Problem Solving: Requires verbal cues;Decreased initiation        Exercises General Exercises - Lower Extremity Ankle Circles/Pumps: 10 reps;AROM;Both;Seated Long Arc Quad: 10 reps;AROM;Both;Seated    General Comments General  comments (skin integrity, edema, etc.): VSS      Pertinent Vitals/Pain Pain Assessment: Faces Faces Pain Scale: Hurts even more Pain Location: pelvis right side Pain Descriptors / Indicators:  Grimacing;Sore Pain Intervention(s): Limited activity within patient's tolerance;Monitored during session;Repositioned    Home Living                      Prior Function            PT Goals (current goals can now be found in the care plan section) Acute Rehab PT Goals Patient Stated Goal: Decrease pain Progress towards PT goals: Progressing toward goals    Frequency    Min 2X/week      PT Plan Current plan remains appropriate    Co-evaluation              AM-PAC PT "6 Clicks" Daily Activity  Outcome Measure  Difficulty turning over in bed (including adjusting bedclothes, sheets and blankets)?: Unable Difficulty moving from lying on back to sitting on the side of the bed? : Unable Difficulty sitting down on and standing up from a chair with arms (e.g., wheelchair, bedside commode, etc,.)?: Unable Help needed moving to and from a bed to chair (including a wheelchair)?: A Little Help needed walking in hospital room?: A Little Help needed climbing 3-5 steps with a railing? : Total 6 Click Score: 10    End of Session Equipment Utilized During Treatment: Gait belt Activity Tolerance: Patient tolerated treatment well Patient left: with call bell/phone within reach;in chair(in w/c) Nurse Communication: Mobility status PT Visit Diagnosis: Unsteadiness on feet (R26.81);Other abnormalities of gait and mobility (R26.89) Pain - Right/Left: Left Pain - part of body: Hip     Time: 0951-1010 PT Time Calculation (min) (ACUTE ONLY): 19 min  Charges:  $Gait Training: 8-22 mins                    G Codes:       [place] [phone] (pager)    Denice Paradise 12/01/2017, 12:55 PM

## 2017-12-01 NOTE — Progress Notes (Signed)
PT Cancellation Note  Patient Details Name: [name] MRN: [id number] DOB: 1941/03/06   Cancelled Treatment:    Reason Eval/Treat Not Completed: Patient at procedure or test/unavailable(Pt in X-ray currently.  Await results prior to PT session )   Denice Paradise 12/01/2017, 8:32 AM [facility] [phone] (pager)

## 2017-12-01 NOTE — Progress Notes (Signed)
Clinical Social Worker following patient for support and discharge needs. Patient has bed and authorization with [place]. Facility stated authorization will expire tomorrow at 5pm. If patient does not discharge by tomorrow authorization will need to be restarted.     [name], MSW,  [place]

## 2017-12-01 NOTE — Consult Note (Addendum)
   [facility] Inpatient Consult   12/01/2017  RIGEL FILSINGER 16-Apr-1941 [id number]    [facility] Care Management follow up.  Went to bedside to speak with Mr. Floyd and wife about [facility] Care Management following post hospital discharge. Spoke with family about [place] Management earlier in hospital stay. However, they were not sure at the time.  Today both patient and wife pleasantly declined [place] Management services. Mrs. Chapdelaine states she still has the [place] Management packet with contact information to call should they change their mind in the future.  Made inpatient RNCM aware [place] Management services were declined.   Marthenia Rolling, MSN-Ed, RN,BSN [facility] Liaison [phone]

## 2017-12-01 NOTE — Progress Notes (Addendum)
  Progress Note    12/01/2017 7:30 AM 21 Days Post-Op  Subjective:  Says his buttocks is pretty sore this morning; says he has thought about it and he moved both hands off the sink to put toothpaste on the toothbrush and that is when he fell back on his butt.  Says it is bc he can't feel his feet.  Says they are propping his leg up on a couple of pillows when he's not up.  Tm 99.3 now afebrile HR 50's-70's NSR 546'T-035'W systolic 65% RA  Vitals:   12/01/17 0000 12/01/17 0400  BP: (!) 155/90 (!) 143/44  Pulse: 63 (!) 57  Resp: (!) 27 (!) 24  Temp:  98.5 F (36.9 C)  SpO2: 94% 96%    Physical Exam: Cardiac:  regular Lungs:  Non labored Incisions:  Unable to examine due to pt being in chair Extremities:  +palpable left DP pulse; LLE with pitting edema; central ecchymosis buttocks.    CBC    Component Value Date/Time   WBC 4.8 11/24/2017 0340   RBC 3.33 (L) 11/24/2017 0340   HGB 10.3 (L) 11/24/2017 0340   HCT 32.9 (L) 11/24/2017 0340   PLT 207 11/24/2017 0340   MCV 98.8 11/24/2017 0340   MCH 30.9 11/24/2017 0340   MCHC 31.3 11/24/2017 0340   RDW 18.7 (H) 11/24/2017 0340   LYMPHSABS 0.7 10/28/2017 2219   MONOABS 0.8 10/28/2017 2219   EOSABS 0.1 10/28/2017 2219   BASOSABS 0.0 10/28/2017 2219    BMET    Component Value Date/Time   NA 137 11/24/2017 0340   K 4.2 11/24/2017 0340   CL 99 11/24/2017 0340   CO2 30 11/24/2017 0340   GLUCOSE 135 (H) 11/24/2017 0340   BUN 22 11/24/2017 0340   CREATININE 1.38 (H) 11/24/2017 0340   CREATININE 1.32 04/18/2012 0949   CALCIUM 8.5 (L) 11/24/2017 0340   GFRNONAA 47 (L) 11/24/2017 0340   GFRAA 55 (L) 11/24/2017 0340    INR    Component Value Date/Time   INR 1.27 11/10/2017 0336     Intake/Output Summary (Last 24 hours) at 12/01/2017 0730 Last data filed at 12/01/2017 0446 Gross per 24 hour  Intake -  Output 600 ml  Net -600 ml     Assessment:  77 y.o. male is s/p:  Wound debridement left groin 21 Days  Post-Op  Plan: -pt fell yesterday and wanted to wait until today to see how he felt before getting xray.  He is very sore and bruised on his buttocks centrally-ordered pelvic xray complete for this morning.  Discussed with RN and will send nurse tech with pt  -pt sitting up in chair this am and unable to change dressing-will change later this morning.  -palpable left DP pulse; left leg with swelling-unchanged -continue abx -DVT prophylaxis:  Sq heparin -continue to encourage nutrition.     Leontine Locket, PA-C [facility] [phone] 12/01/2017 7:30 AM  Agree with above.  Pelvis xray negative.  Wound about the same. Will check left groin tomorrow if looks reasonable SNF Friday  Ruta Hinds, MD [facility] [place] Office: [phone] Pager: [phone]

## 2017-12-02 LAB — CBC
HEMATOCRIT: 34.9 % — AB (ref 39.0–52.0)
Hemoglobin: 10.5 g/dL — ABNORMAL LOW (ref 13.0–17.0)
MCH: 30.5 pg (ref 26.0–34.0)
MCHC: 30.1 g/dL (ref 30.0–36.0)
MCV: 101.5 fL — ABNORMAL HIGH (ref 78.0–100.0)
Platelets: 206 10*3/uL (ref 150–400)
RBC: 3.44 MIL/uL — ABNORMAL LOW (ref 4.22–5.81)
RDW: 17.7 % — AB (ref 11.5–15.5)
WBC: 5.1 10*3/uL (ref 4.0–10.5)

## 2017-12-02 LAB — BASIC METABOLIC PANEL
ANION GAP: 10 (ref 5–15)
BUN: 24 mg/dL — ABNORMAL HIGH (ref 8–23)
CALCIUM: 8.7 mg/dL — AB (ref 8.9–10.3)
CO2: 26 mmol/L (ref 22–32)
Chloride: 101 mmol/L (ref 98–111)
Creatinine, Ser: 1.59 mg/dL — ABNORMAL HIGH (ref 0.61–1.24)
GFR, EST AFRICAN AMERICAN: 46 mL/min — AB (ref >60)
GFR, EST NON AFRICAN AMERICAN: 40 mL/min — AB (ref >60)
Glucose, Bld: 121 mg/dL — ABNORMAL HIGH (ref 70–99)
Potassium: 4.2 mmol/L (ref 3.5–5.1)
Sodium: 137 mmol/L (ref 135–145)

## 2017-12-02 LAB — GLUCOSE, CAPILLARY
GLUCOSE-CAPILLARY: 107 mg/dL — AB (ref 70–99)
GLUCOSE-CAPILLARY: 178 mg/dL — AB (ref 70–99)
Glucose-Capillary: 184 mg/dL — ABNORMAL HIGH (ref 70–99)
Glucose-Capillary: 171 mg/dL — ABNORMAL HIGH (ref 70–99)

## 2017-12-02 MED ORDER — SENNOSIDES-DOCUSATE SODIUM 8.6-50 MG PO TABS: 1.0000 | ORAL_TABLET | Freq: Every evening | ORAL | Status: DC | PRN

## 2017-12-02 MED ORDER — CEPHALEXIN 500 MG PO CAPS: 500.0000 mg | ORAL_CAPSULE | Freq: Two times a day (BID) | ORAL | Status: DC

## 2017-12-02 MED ORDER — ADULT MULTIVITAMIN W/MINERALS CH: 1.0000 | ORAL_TABLET | Freq: Every day | ORAL | Status: AC

## 2017-12-02 MED ORDER — HYDROCODONE-ACETAMINOPHEN 5-325 MG PO TABS: 1.0000 | ORAL_TABLET | Freq: Four times a day (QID) | ORAL | 0 refills | Status: DC | PRN

## 2017-12-02 MED ORDER — TAMSULOSIN HCL 0.4 MG PO CAPS: 0.4000 mg | ORAL_CAPSULE | Freq: Every day | ORAL | Status: DC

## 2017-12-02 MED ORDER — FERROUS SULFATE 325 (65 FE) MG PO TABS: 325.0000 mg | ORAL_TABLET | Freq: Three times a day (TID) | ORAL | Status: DC

## 2017-12-02 NOTE — Progress Notes (Signed)
OT Treatment Note  Pt continues to improve with functional mobility and participation in ADL despite fall yesterday. Pt required mod assist +2 for sit to stand and min assist for pivot transfers and total assist for peri care. D/c plan remains appropriate. Will continue to follow acutely.    12/02/17 1113  OT Visit Information  Last OT Received On 12/02/17  Assistance Needed +2 (Can be done with +1, but +2 helpful as pt fatigues)  History of Present Illness 77 y.o. patient who underwent left common femoral endarterectomy and left femoral to below-knee popliteal bypass 6/03, discharged to SNF, returns for follow up with left groin wound infection, debrided and wound vac placed 6/14. PMH includes: foot drop L, DM, peripheral neuropathy, PVD, L food wounds, B RTC repair  Precautions  Precautions Fall  Pain Assessment  Pain Assessment Faces  Faces Pain Scale 6  Pain Location R buttocks  Pain Descriptors / Indicators Grimacing;Sore  Pain Intervention(s) Monitored during session;Repositioned  Cognition  Arousal/Alertness Awake/alert  Behavior During Therapy WFL for tasks assessed/performed  Overall Cognitive Status Impaired/Different from baseline  Area of Impairment Safety/judgement;Problem solving;Memory  Memory Decreased short-term memory  Following Commands Follows one step commands with increased time;Follows multi-step commands with increased time  Safety/Judgement Decreased awareness of safety  Awareness Emergent  Problem Solving Requires verbal cues;Decreased initiation  Upper Extremity Assessment  Upper Extremity Assessment Overall WFL for tasks assessed  Lower Extremity Assessment  Lower Extremity Assessment Defer to PT evaluation  ADL  Overall ADL's  Needs assistance/impaired  Upper Body Dressing  Minimal assistance;Sitting  Toilet Transfer Moderate assistance;+2 for physical assistance;Stand-pivot;BSC;RW  Toileting- Clothing Manipulation and Hygiene Total assistance;Sit  to/from stand  Functional mobility during ADLs Minimal assistance;Rolling walker  Bed Mobility  Overal bed mobility Needs Assistance  Bed Mobility Supine to Sit  Supine to sit Min assist;HOB elevated  General bed mobility comments Increased time and effort, min assist for LEs to EOB and light assist for trunk elevation to sitting  Balance  Overall balance assessment Needs assistance  Sitting-balance support Feet supported;No upper extremity supported  Sitting balance-Leahy Scale Fair  Sitting balance - Comments UE support sitting EOB  Standing balance support Bilateral upper extremity supported;During functional activity  Standing balance-Leahy Scale Poor  Standing balance comment walker and min guard assist for static standing once standing established.   Restrictions  Weight Bearing Restrictions No  LLE Weight Bearing WBAT  Other Position/Activity Restrictions Pt with foot drop on L causing occasional stumble  Transfers  Overall transfer level Needs assistance  Equipment used Rolling walker (2 wheeled)  Transfers Sit to/from Stand;Stand Pivot Transfers  Sit to Stand +2 physical assistance;From elevated surface;Mod assist  Stand pivot transfers Min assist;+2 safety/equipment  General transfer comment Mod A +2 to power up into standing with bilateral feet blocked. Min assist for stand pviot transfer EOB>BSC. Increased time and effort throughout  OT - End of Session  Equipment Utilized During Treatment Gait belt;Rolling walker  Activity Tolerance Patient tolerated treatment well  Patient left Other (comment) (walking with PT)  OT Assessment/Plan  OT Plan Discharge plan remains appropriate  OT Visit Diagnosis Unsteadiness on feet (R26.81);Other abnormalities of gait and mobility (R26.89);Muscle weakness (generalized) (M62.81);Pain  Pain - Right/Left Right  Pain - part of body  (buttocks)  OT Frequency (ACUTE ONLY) Min 2X/week  Follow Up Recommendations SNF;Supervision/Assistance -  24 hour  OT Equipment Other (comment) (TBD at next venue)  AM-PAC OT "6 Clicks" Daily Activity Outcome Measure  Help from  another person eating meals? 4  Help from another person taking care of personal grooming? 3  Help from another person toileting, which includes using toliet, bedpan, or urinal? 2  Help from another person bathing (including washing, rinsing, drying)? 2  Help from another person to put on and taking off regular upper body clothing? 3  Help from another person to put on and taking off regular lower body clothing? 2  6 Click Score 16  ADL G Code Conversion CK  OT Goal Progression  Progress towards OT goals Progressing toward goals  Acute Rehab OT Goals  Patient Stated Goal Decrease pain  OT Goal Formulation With patient  OT Time Calculation  OT Start Time (ACUTE ONLY) 0902  OT Stop Time (ACUTE ONLY) 0933  OT Time Calculation (min) 31 min  OT General Charges  $OT Visit 1 Visit  OT Treatments  $Self Care/Home Management  23-37 mins   Shawnell Dykes A. Ulice Brilliant, M.S., OTR/L Acute Rehab Department: [phone]

## 2017-12-02 NOTE — Progress Notes (Addendum)
Physical Therapy Treatment Patient Details Name: RAKESH DUTKO MRN: [id number] DOB: 04-May-1941 Today's Date: 12/02/2017    History of Present Illness 77 y.o. patient who underwent left common femoral endarterectomy and left femoral to below-knee popliteal bypass 6/03, discharged to SNF, returns for follow up with left groin wound infection, debrided and wound vac placed 6/14. PMH includes: foot drop L, DM, peripheral neuropathy, PVD, L food wounds, B RTC repair    PT Comments    Pt continues to make good progress. Sore from fall 2 days ago but continues to improve mobility.   Follow Up Recommendations  SNF;Supervision/Assistance - 24 hour     Equipment Recommendations  Other (comment)(To be determined at next venue)    Recommendations for Other Services       Precautions / Restrictions Precautions Precautions: Fall Restrictions Weight Bearing Restrictions: No LLE Weight Bearing: Weight bearing as tolerated Other Position/Activity Restrictions: Pt with foot drop on L causing occasional stumble    Mobility  Bed Mobility Overal bed mobility: Needs Assistance Bed Mobility: Supine to Sit     Supine to sit: Min assist;HOB elevated     General bed mobility comments: Increased time and effort, min assist for LEs to EOB and light assist for trunk elevation to sitting  Transfers Overall transfer level: Needs assistance Equipment used: Rolling walker (2 wheeled) Transfers: Sit to/from Stand Sit to Stand: +2 physical assistance;From elevated surface;Mod assist        General transfer comment: Assist to bring hips up and for balance. Pt with posterior bias when rising.  Ambulation/Gait Ambulation/Gait assistance: Min guard Gait Distance (Feet): 200 Feet Assistive device: Rolling walker (2 wheeled) Gait Pattern/deviations: Decreased step length - right;Decreased step length - left;Trunk flexed;Step-through pattern;Decreased dorsiflexion - left Gait velocity: decr Gait  velocity interpretation: <1.31 ft/sec, indicative of household ambulator General Gait Details: min assist for safety and balance. Pt took 3-4 brief standing rest breaks. Verbal cues to stand more erect   Stairs             Wheelchair Mobility    Modified Rankin (Stroke Patients Only)       Balance Overall balance assessment: Needs assistance Sitting-balance support: Feet supported;No upper extremity supported Sitting balance-Leahy Scale: Fair   Standing balance support: Bilateral upper extremity supported;During functional activity Standing balance-Leahy Scale: Poor Standing balance comment: walker and min guard assist for static standing once standing established.                             Cognition Arousal/Alertness: Awake/alert Behavior During Therapy: WFL for tasks assessed/performed Overall Cognitive Status: Impaired/Different from baseline Area of Impairment: Safety/judgement;Problem solving;Memory                     Memory: Decreased short-term memory Following Commands: Follows one step commands with increased time;Follows multi-step commands with increased time Safety/Judgement: Decreased awareness of safety Awareness: Emergent Problem Solving: Requires verbal cues;Decreased initiation        Exercises      General Comments        Pertinent Vitals/Pain Pain Assessment: Faces Faces Pain Scale: Hurts even more Pain Location: R buttocks Pain Descriptors / Indicators: Grimacing;Sore Pain Intervention(s): Monitored during session;Repositioned    Home Living                      Prior Function            PT  Goals (current goals can now be found in the care plan section) Acute Rehab PT Goals Patient Stated Goal: Decrease pain PT Goal Formulation: With patient Time For Goal Achievement: 12/16/17 Potential to Achieve Goals: Good Progress towards PT goals: Goals met and updated - see care plan;Progressing toward  goals    Frequency    Min 2X/week      PT Plan Current plan remains appropriate    Co-evaluation              AM-PAC PT "6 Clicks" Daily Activity  Outcome Measure  Difficulty turning over in bed (including adjusting bedclothes, sheets and blankets)?: Unable Difficulty moving from lying on back to sitting on the side of the bed? : Unable Difficulty sitting down on and standing up from a chair with arms (e.g., wheelchair, bedside commode, etc,.)?: Unable Help needed moving to and from a bed to chair (including a wheelchair)?: A Lot Help needed walking in hospital room?: A Little Help needed climbing 3-5 steps with a railing? : Total 6 Click Score: 9    End of Session Equipment Utilized During Treatment: Gait belt Activity Tolerance: Patient tolerated treatment well Patient left: in chair;with call bell/phone within reach Nurse Communication: Mobility status PT Visit Diagnosis: Unsteadiness on feet (R26.81);Other abnormalities of gait and mobility (R26.89) Pain - Right/Left: Left Pain - part of body: Hip     Time: 7628-3151 PT Time Calculation (min) (ACUTE ONLY): 16 min  Charges:  $Gait Training: 8-22 mins                    G Codes:       [facility] PT [place] 12/02/2017, 12:11 PM

## 2017-12-02 NOTE — Progress Notes (Addendum)
  Progress Note    12/02/2017 7:40 AM 22 Days Post-Op  Subjective:  No complaints  Afebrile HR 50's-60's  61'P-509'T systolic 26% RA  Vitals:   12/02/17 0030 12/02/17 0422  BP: (!) 97/46 (!) 126/49  Pulse: (!) 54 (!) 49  Resp: (!) 22 (!) 21  Temp: 97.6 F (36.4 C) (!) 97.4 F (36.3 C)  SpO2: (!) 89% 98%    Physical Exam: Cardiac:  regular Lungs:   Non labored Incisions:  wound beefy red overall; still with small crevice centrally; scab on lateral left foot fell off and looks good.  Left great toe continues to improve Extremities:  Chronic swelling LLE; +palpable left DP pulse   CBC    Component Value Date/Time   WBC 5.1 12/02/2017 0340   RBC 3.44 (L) 12/02/2017 0340   HGB 10.5 (L) 12/02/2017 0340   HCT 34.9 (L) 12/02/2017 0340   PLT 206 12/02/2017 0340   MCV 101.5 (H) 12/02/2017 0340   MCH 30.5 12/02/2017 0340   MCHC 30.1 12/02/2017 0340   RDW 17.7 (H) 12/02/2017 0340   LYMPHSABS 0.7 10/28/2017 2219   MONOABS 0.8 10/28/2017 2219   EOSABS 0.1 10/28/2017 2219   BASOSABS 0.0 10/28/2017 2219    BMET    Component Value Date/Time   NA 137 12/02/2017 0340   K 4.2 12/02/2017 0340   CL 101 12/02/2017 0340   CO2 26 12/02/2017 0340   GLUCOSE 121 (H) 12/02/2017 0340   BUN 24 (H) 12/02/2017 0340   CREATININE 1.59 (H) 12/02/2017 0340   CREATININE 1.32 04/18/2012 0949   CALCIUM 8.7 (L) 12/02/2017 0340   GFRNONAA 40 (L) 12/02/2017 0340   GFRAA 46 (L) 12/02/2017 0340    INR    Component Value Date/Time   INR 1.27 11/10/2017 0336     Intake/Output Summary (Last 24 hours) at 12/02/2017 0740 Last data filed at 12/02/2017 0600 Gross per 24 hour  Intake 1080 ml  Output 875 ml  Net 205 ml     Assessment:  77 y.o. male is s/p:  Wound debridement left groin  22 Days Post-Op  Plan: -pt with palpable left DP pulse this am; still with chronic swelling -dressing changed-wound beefy red overall; still with small crevice centrally-continue gentle wet to dry  saline dressing changes -DVT prophylaxis:  Sq heparin -creatinine 1.59 today -up from 1.38-hold ACEI for now; continuing to receive lasix-BUN essentially unchanged.  Potassium okay at 4.2 -plan for dc to SNF tomorrow   Leontine Locket, PA-C [facility] [phone] 12/02/2017 7:40 AM  Wound still tenuous but I believe manageable at SNF at this point Will plan d/c tomorrow  Ruta Hinds, MD [facility] [place] Office: [phone] Pager: [phone]

## 2017-12-03 ENCOUNTER — Telehealth: Payer: Self-pay | Admitting: Vascular Surgery

## 2017-12-03 DIAGNOSIS — M6281 Muscle weakness (generalized): Secondary | ICD-10-CM | POA: Diagnosis not present

## 2017-12-03 DIAGNOSIS — E8770 Fluid overload, unspecified: Secondary | ICD-10-CM | POA: Diagnosis not present

## 2017-12-03 DIAGNOSIS — R279 Unspecified lack of coordination: Secondary | ICD-10-CM | POA: Diagnosis not present

## 2017-12-03 DIAGNOSIS — I743 Embolism and thrombosis of arteries of the lower extremities: Secondary | ICD-10-CM | POA: Diagnosis not present

## 2017-12-03 DIAGNOSIS — R41841 Cognitive communication deficit: Secondary | ICD-10-CM | POA: Diagnosis not present

## 2017-12-03 DIAGNOSIS — R52 Pain, unspecified: Secondary | ICD-10-CM | POA: Diagnosis not present

## 2017-12-03 DIAGNOSIS — R3915 Urgency of urination: Secondary | ICD-10-CM | POA: Diagnosis not present

## 2017-12-03 DIAGNOSIS — I130 Hypertensive heart and chronic kidney disease with heart failure and stage 1 through stage 4 chronic kidney disease, or unspecified chronic kidney disease: Secondary | ICD-10-CM | POA: Diagnosis not present

## 2017-12-03 DIAGNOSIS — Z48812 Encounter for surgical aftercare following surgery on the circulatory system: Secondary | ICD-10-CM | POA: Diagnosis not present

## 2017-12-03 DIAGNOSIS — I70245 Atherosclerosis of native arteries of left leg with ulceration of other part of foot: Secondary | ICD-10-CM | POA: Diagnosis not present

## 2017-12-03 DIAGNOSIS — I5032 Chronic diastolic (congestive) heart failure: Secondary | ICD-10-CM | POA: Diagnosis not present

## 2017-12-03 DIAGNOSIS — M7981 Nontraumatic hematoma of soft tissue: Secondary | ICD-10-CM | POA: Diagnosis not present

## 2017-12-03 DIAGNOSIS — E1122 Type 2 diabetes mellitus with diabetic chronic kidney disease: Secondary | ICD-10-CM | POA: Diagnosis not present

## 2017-12-03 DIAGNOSIS — Z743 Need for continuous supervision: Secondary | ICD-10-CM | POA: Diagnosis not present

## 2017-12-03 DIAGNOSIS — N139 Obstructive and reflux uropathy, unspecified: Secondary | ICD-10-CM | POA: Diagnosis not present

## 2017-12-03 DIAGNOSIS — K5900 Constipation, unspecified: Secondary | ICD-10-CM | POA: Diagnosis not present

## 2017-12-03 DIAGNOSIS — R2689 Other abnormalities of gait and mobility: Secondary | ICD-10-CM | POA: Diagnosis not present

## 2017-12-03 DIAGNOSIS — E1142 Type 2 diabetes mellitus with diabetic polyneuropathy: Secondary | ICD-10-CM | POA: Diagnosis not present

## 2017-12-03 DIAGNOSIS — R278 Other lack of coordination: Secondary | ICD-10-CM | POA: Diagnosis not present

## 2017-12-03 DIAGNOSIS — I2510 Atherosclerotic heart disease of native coronary artery without angina pectoris: Secondary | ICD-10-CM | POA: Diagnosis not present

## 2017-12-03 DIAGNOSIS — Z7401 Bed confinement status: Secondary | ICD-10-CM | POA: Diagnosis not present

## 2017-12-03 DIAGNOSIS — I10 Essential (primary) hypertension: Secondary | ICD-10-CM | POA: Diagnosis not present

## 2017-12-03 DIAGNOSIS — W19XXXA Unspecified fall, initial encounter: Secondary | ICD-10-CM | POA: Diagnosis not present

## 2017-12-03 DIAGNOSIS — E1140 Type 2 diabetes mellitus with diabetic neuropathy, unspecified: Secondary | ICD-10-CM | POA: Diagnosis not present

## 2017-12-03 DIAGNOSIS — R2681 Unsteadiness on feet: Secondary | ICD-10-CM | POA: Diagnosis not present

## 2017-12-03 DIAGNOSIS — I739 Peripheral vascular disease, unspecified: Secondary | ICD-10-CM | POA: Diagnosis not present

## 2017-12-03 DIAGNOSIS — E1152 Type 2 diabetes mellitus with diabetic peripheral angiopathy with gangrene: Secondary | ICD-10-CM | POA: Diagnosis not present

## 2017-12-03 DIAGNOSIS — M628 Other specified disorders of muscle: Secondary | ICD-10-CM | POA: Diagnosis not present

## 2017-12-03 DIAGNOSIS — T8149XD Infection following a procedure, other surgical site, subsequent encounter: Secondary | ICD-10-CM | POA: Diagnosis not present

## 2017-12-03 DIAGNOSIS — L02214 Cutaneous abscess of groin: Secondary | ICD-10-CM | POA: Diagnosis not present

## 2017-12-03 DIAGNOSIS — E440 Moderate protein-calorie malnutrition: Secondary | ICD-10-CM | POA: Diagnosis not present

## 2017-12-03 DIAGNOSIS — I259 Chronic ischemic heart disease, unspecified: Secondary | ICD-10-CM | POA: Diagnosis not present

## 2017-12-03 DIAGNOSIS — E038 Other specified hypothyroidism: Secondary | ICD-10-CM | POA: Diagnosis not present

## 2017-12-03 DIAGNOSIS — I7389 Other specified peripheral vascular diseases: Secondary | ICD-10-CM | POA: Diagnosis not present

## 2017-12-03 DIAGNOSIS — N183 Chronic kidney disease, stage 3 (moderate): Secondary | ICD-10-CM | POA: Diagnosis not present

## 2017-12-03 DIAGNOSIS — N400 Enlarged prostate without lower urinary tract symptoms: Secondary | ICD-10-CM | POA: Diagnosis not present

## 2017-12-03 DIAGNOSIS — M2550 Pain in unspecified joint: Secondary | ICD-10-CM | POA: Diagnosis not present

## 2017-12-03 DIAGNOSIS — G63 Polyneuropathy in diseases classified elsewhere: Secondary | ICD-10-CM | POA: Diagnosis not present

## 2017-12-03 DIAGNOSIS — D518 Other vitamin B12 deficiency anemias: Secondary | ICD-10-CM | POA: Diagnosis not present

## 2017-12-03 LAB — GLUCOSE, CAPILLARY: Glucose-Capillary: 120 mg/dL — ABNORMAL HIGH (ref 70–99)

## 2017-12-03 MED ORDER — METOPROLOL TARTRATE 50 MG PO TABS: 50.0000 mg | ORAL_TABLET | Freq: Two times a day (BID) | ORAL | Status: DC

## 2017-12-03 MED ORDER — GABAPENTIN 300 MG PO CAPS: 300.0000 mg | ORAL_CAPSULE | Freq: Every day | ORAL | Status: DC

## 2017-12-03 MED ORDER — PIOGLITAZONE HCL 30 MG PO TABS: 30.0000 mg | ORAL_TABLET | Freq: Every day | ORAL | Status: DC

## 2017-12-03 MED ORDER — FUROSEMIDE 40 MG PO TABS: 40.0000 mg | ORAL_TABLET | Freq: Every day | ORAL | Status: DC

## 2017-12-03 NOTE — Progress Notes (Addendum)
  Progress Note    12/03/2017 7:27 AM 23 Days Post-Op  Subjective:  Sleeping-awakes easily  Tm 99.2 now afebrile HR 50's-70's NSR 544'B-201'E systolic 071% 2RF7JO  Vitals:   12/02/17 2316 12/03/17 0432  BP: (!) 156/52 (!) 144/59  Pulse: 72 (!) 55  Resp: (!) 25 (!) 22  Temp: 99.2 F (37.3 C) 98.1 F (36.7 C)  SpO2: 95% 90%    Physical Exam: Cardiac:  regular Lungs:  Non labored Incisions:  Left groin overall clean with beefy red tissue; crevice still present centrally; unable to see graft Extremities:  2+ pitting edema; +palpable left DP pulse   CBC    Component Value Date/Time   WBC 5.1 12/02/2017 0340   RBC 3.44 (L) 12/02/2017 0340   HGB 10.5 (L) 12/02/2017 0340   HCT 34.9 (L) 12/02/2017 0340   PLT 206 12/02/2017 0340   MCV 101.5 (H) 12/02/2017 0340   MCH 30.5 12/02/2017 0340   MCHC 30.1 12/02/2017 0340   RDW 17.7 (H) 12/02/2017 0340   LYMPHSABS 0.7 10/28/2017 2219   MONOABS 0.8 10/28/2017 2219   EOSABS 0.1 10/28/2017 2219   BASOSABS 0.0 10/28/2017 2219    BMET    Component Value Date/Time   NA 137 12/02/2017 0340   K 4.2 12/02/2017 0340   CL 101 12/02/2017 0340   CO2 26 12/02/2017 0340   GLUCOSE 121 (H) 12/02/2017 0340   BUN 24 (H) 12/02/2017 0340   CREATININE 1.59 (H) 12/02/2017 0340   CREATININE 1.32 04/18/2012 0949   CALCIUM 8.7 (L) 12/02/2017 0340   GFRNONAA 40 (L) 12/02/2017 0340   GFRAA 46 (L) 12/02/2017 0340    INR    Component Value Date/Time   INR 1.27 11/10/2017 0336     Intake/Output Summary (Last 24 hours) at 12/03/2017 0727 Last data filed at 12/03/2017 0701 Gross per 24 hour  Intake 1080 ml  Output 2600 ml  Net -1520 ml     Assessment:  77 y.o. male is s/p:  Wound debridement left groin x 2  23 Days Post-Op  Plan: -pt's wound is clean this morning-still have crevice central.  He will need meticulous wound care -2+ pitting edema LLE; palpable left DP pulse -DVT prophylaxis:  Sq heparin -continue mobilization at  SNF -discussed with Dr. Oneida Alar and will continue daily lasix.  Pt is to f/u with Dr.  Laurann Montana in 1 week to follow up on labs (CBC/CMP/albumin).  He will f/u with Dr. Diona Fanti in 1-2 weeks since voiding trial. -f/u with Dr. Oneida Alar in 1 week for wound check.     Leontine Locket, PA-C [facility] [phone] 12/03/2017 7:27 AM   I have interviewed and examined patient with PA and agree with assessment and plan above.   Brandon C. Donzetta Matters, MD [facility] [place] Office: [phone] Pager: [phone]

## 2017-12-03 NOTE — Progress Notes (Signed)
Pt/family given discharge instructions, medication lists, follow up appointments, and when to call the doctor.  Pt/family verbalizes understanding. [facility] transporting to [facility] via ambulance. Wife assisted with cart to take belongings to main entrance. Will call report to [facility]. Payton Emerald, RN

## 2017-12-03 NOTE — Clinical Social Work Placement (Signed)
   CLINICAL SOCIAL WORK PLACEMENT  NOTE  Date:  12/03/2017  Patient Details  Name: [name] MRN: [id number] Date of Birth: 09/11/1940  Clinical Social Work is seeking post-discharge placement for this patient at the [place] level of care (*CSW will initial, date and re-position this form in  chart as items are completed):  Yes   Patient/family provided with [place] Work Department's list of facilities offering this level of care within the geographic area requested by the patient (or if unable, by the patient's family).  Yes   Patient/family informed of their freedom to choose among providers that offer the needed level of care, that participate in Medicare, Medicaid or managed care program needed by the patient, have an available bed and are willing to accept the patient.      Patient/family informed of [facility] ownership interest in [facility] and [facility], as well as of the fact that they are under no obligation to receive care at these facilities.  PASRR submitted to EDS on       PASRR number received on       Existing PASRR number confirmed on 12/05/17     FL2 transmitted to all facilities in geographic area requested by pt/family on 12/05/17     FL2 transmitted to all facilities within larger geographic area on       Patient informed that his/her managed care company has contracts with or will negotiate with certain facilities, including the following:        Yes   Patient/family informed of bed offers received.  Patient chooses bed at [facility] [place], [place]     Physician recommends and patient chooses bed at      Patient to be transferred to [place], [place] on 12/03/17.  Patient to be transferred to facility by [facility]     Patient family notified on 12/03/17 of transfer.  Name of family member notified:  sherry, spouse     PHYSICIAN       Additional Comment:     _______________________________________________ Eileen Stanford, LCSW 12/03/2017, 9:50 AM

## 2017-12-03 NOTE — Telephone Encounter (Signed)
sch appt spk to pt 12/09/17 3pm wound check

## 2017-12-03 NOTE — Clinical Social Work Note (Signed)
Clinical Social Worker facilitated patient discharge including contacting patient family and facility to confirm patient discharge plans.  Clinical information faxed to facility and family agreeable with plan.  CSW arranged ambulance transport via [facility] to [place] --room 105 .  RN to call [phone] for report prior to discharge.  Clinical Social Worker will sign off for now as social work intervention is no longer needed. Please consult [place] again if new need arises.  Mahamad Jabbi LCSWA [phone]

## 2017-12-03 NOTE — Progress Notes (Signed)
Called report to [facility] and all questions answered. Payton Emerald, RN

## 2017-12-03 NOTE — Care Management Note (Signed)
Case Management Note Marvetta Gibbons RN, BSN Unit 4E-Case Manager [phone]  Patient Details  Name: [name] MRN: [id number] Date of Birth: 1940-11-20  Subjective/Objective:  Pt readmitted with poorly healing left groin IV abx started on admission, s/p debridement and wound vAC placement on 6/14               Action/Plan: PTA pt had transitioned to [facility] after recent surgery. Prior to that home with wife. CSW has been following for return to [facility] when medically cleared for discharge. Have been notified by dept. Director that pt's daughter would like to meet with CM and CSW at the bedside tomorrow to discuss transition plan. CM will plan to meet with pt and family along with CSW on 6/26.   Expected Discharge Date:  12/03/17               Expected Discharge Plan:  Skilled Nursing Facility  In-House Referral:  Clinical Social Work  Discharge planning Services  CM Consult  Post Acute Care Choice:    Choice offered to:     DME Arranged:    DME Agency:     HH Arranged:    [place] Agency:     Status of Service:  Completed, signed off  If discussed at [employer], dates discussed:    Discharge Disposition: skilled facility  Additional Comments:  12/03/17- 0930- Marvetta Gibbons RN, CM- pt ready for discharge today- CSW has communicated with [facility]- and updated Josem Kaufmann has been received for transition today. CSW following for transition needs   11/29/17- 1200- Michiah Mudry RN, CM- pt continues to need in house wound care per MD anticipate pt to be ready towards the end of the week- CSW continues to follow for [facility] needs- pt will have auth for bed at [facility] that will expire this Thursday 12/02/17.   11/19/17- 1400- Foster Frericks RN, CM- MD has decided pt needs further wound care in house prior to transition to SNF. CSW will continue to follow for medical readiness for SNF.   11/17/17- 1430- Marvetta Gibbons RN, CM- met with pt, wife and daughter along with  CSW-Ashley at the bedside to discuss transition of care plan and needs- questions and concerns addressed by both CM and CSW for transition back to [facility]- daughter would like early transfer/discharge if pt is discharged on Friday of this week so that pt can be settled into [facility] earlier in the day. CSW will f/u on pending auth for [facility] and transition to [facility] when medically cleared by medical team.  CM will follow at a distance for any f/u questions or concerns. Daughter also shared that she is meeting with [employer] for additional assistance at [place] while family is on vacation next week to support pt.   Dawayne Patricia, RN 12/03/2017, 9:57 AM

## 2017-12-05 DIAGNOSIS — D518 Other vitamin B12 deficiency anemias: Secondary | ICD-10-CM | POA: Diagnosis not present

## 2017-12-05 DIAGNOSIS — N400 Enlarged prostate without lower urinary tract symptoms: Secondary | ICD-10-CM | POA: Diagnosis not present

## 2017-12-05 DIAGNOSIS — R2689 Other abnormalities of gait and mobility: Secondary | ICD-10-CM | POA: Diagnosis not present

## 2017-12-05 DIAGNOSIS — E1140 Type 2 diabetes mellitus with diabetic neuropathy, unspecified: Secondary | ICD-10-CM | POA: Diagnosis not present

## 2017-12-05 DIAGNOSIS — I7389 Other specified peripheral vascular diseases: Secondary | ICD-10-CM | POA: Diagnosis not present

## 2017-12-05 DIAGNOSIS — I2510 Atherosclerotic heart disease of native coronary artery without angina pectoris: Secondary | ICD-10-CM | POA: Diagnosis not present

## 2017-12-05 DIAGNOSIS — E440 Moderate protein-calorie malnutrition: Secondary | ICD-10-CM | POA: Diagnosis not present

## 2017-12-05 DIAGNOSIS — E038 Other specified hypothyroidism: Secondary | ICD-10-CM | POA: Diagnosis not present

## 2017-12-05 DIAGNOSIS — L02214 Cutaneous abscess of groin: Secondary | ICD-10-CM | POA: Diagnosis not present

## 2017-12-07 ENCOUNTER — Telehealth: Payer: Self-pay | Admitting: *Deleted

## 2017-12-07 DIAGNOSIS — I70245 Atherosclerosis of native arteries of left leg with ulceration of other part of foot: Secondary | ICD-10-CM | POA: Diagnosis not present

## 2017-12-07 NOTE — Telephone Encounter (Signed)
Mrs. Magwood called to report that she thinks patient may have a groin wound infection again. She states that she can smell an odor that was not present at discharge.   I called [facility] in [place] [phone]) to speak with his nurse regarding this concern. Patient had left common femoral endarterectomy and left femoral to below-knee popliteal bypass with vein on 10-25-17 and then had subsequent debridement on 11-05-17. Evidently, Dr. Sharlett Iles spoke with Dr. Oneida Alar on 12-05-17 and discussed possible tunneling in wound. It was decided to switch to iodoform packing. The nurse, Tammy, said that patient has no current S&S symptoms of infection.   I called Mrs. Doolen and reassured her that this treatment plan change was OK and that she may be smelling the odor of the iodoform packing.

## 2017-12-09 ENCOUNTER — Other Ambulatory Visit: Payer: Self-pay

## 2017-12-09 ENCOUNTER — Ambulatory Visit (INDEPENDENT_AMBULATORY_CARE_PROVIDER_SITE_OTHER): Payer: Medicare HMO | Admitting: Vascular Surgery

## 2017-12-09 ENCOUNTER — Encounter: Payer: Self-pay | Admitting: Vascular Surgery

## 2017-12-09 VITALS — BP 139/60 | HR 66 | Temp 98.0°F | Resp 18 | Ht 71.0 in | Wt 257.0 lb

## 2017-12-09 DIAGNOSIS — I739 Peripheral vascular disease, unspecified: Secondary | ICD-10-CM

## 2017-12-09 DIAGNOSIS — T8149XA Infection following a procedure, other surgical site, initial encounter: Secondary | ICD-10-CM

## 2017-12-09 NOTE — Progress Notes (Addendum)
    Postoperative Visit   History of Present Illness   BUREN HAVEY is a 77 y.o. year old male who presents for postoperative follow-up for: L CFA endarterectomy and femoral to popliteal artery bypass with vein with subsequent hospitalization for wound debridement x2 and IV antibiotics.  Surgical history also significant for right lower extremity bypass in 2013 as well as a aorta bifemoral bypass in 2007.  Patient is currently residing at collapse nursing facility.  He is taking Keflex regimen religiously.  He is receiving wet-to-dry dressing changes in his left groin twice daily.  Patient's daughter is present who believes left groin wound is improving.  Patient denies any rest pain or further tissue changes of his left foot.  Left great toe with dry gangrene is unchanged.  Patient's daughter has questions about foot drop prosthetic brace, necessity for compression stockings, as well as the necessity of diabetic shoes.  Patient denies any fevers, chills, nausea/vomiting.  Appetite has improved since discharge from hospital.   For VQI Use Only   PRE-ADM LIVING: Nursing home  AMB STATUS: Ambulatory with Assistance   Physical Examination   Vitals:   12/09/17 1526  BP: 139/60  Pulse: 66  Resp: 18  Temp: 98 F (36.7 C)  TempSrc: Oral  SpO2: 96%  Weight: 257 lb (116.6 kg)  Height: 5\' 11"  (1.803 m)    LLE: Left groin wound with a healthy wound bed and some granulation tissue, 4 cm long by 3 cm wide by 3-1/2 cm deep; no exposure of graft material; incisions of medial left lower leg well-healed; palpable left DP   Medical Decision Making   BRYLIN STANISLAWSKI is a 77 y.o. year old male who presents s/p L CFA endarterectomy and femoral to popliteal artery bypass with vein with subsequent hospitalization for wound debridement x2 and IV antibiotics.   Patent bypass with palpable left DP pulse  Continue current wound care with twice daily wet-to-dry packing dressing changes  Continue  Keflex regimen; patient may call the office for refills as needed  [place] to wear compression to assist with edema of bilateral lower extremities  Ok for patient to use foot drop prosthetic as long as popliteal incision is protected  Prescription given for diabetic protective shoes  Return to office for wound check in 2 weeks  Dr. Oneida Alar was involved in the evaluation and management plan of this patient during office visit today  Dagoberto Ligas PA-C [facility] [place] Office: [phone]  History and exam findings as above.  The left groin wound continues to heal.  The central cavity is about 4 mm in diameter 4 mm in depth which is about 50% smaller.  The main wound is 5 x 5 cm and about 3 cm in depth.  All has healthy appearing granulation tissue.  He has a 2+ dorsalis pedis pulse and dry gangrenous changes left first toe.  He will follow-up with me in 2 weeks time and continue his Keflex indefinitely.  Ruta Hinds, MD [facility] [place] Office: [phone] Pager: [phone]

## 2017-12-10 DIAGNOSIS — N139 Obstructive and reflux uropathy, unspecified: Secondary | ICD-10-CM | POA: Diagnosis not present

## 2017-12-10 DIAGNOSIS — R3915 Urgency of urination: Secondary | ICD-10-CM | POA: Diagnosis not present

## 2017-12-13 ENCOUNTER — Encounter

## 2017-12-13 ENCOUNTER — Other Ambulatory Visit: Payer: Self-pay | Admitting: Vascular Surgery

## 2017-12-13 ENCOUNTER — Ambulatory Visit: Payer: Medicare HMO | Admitting: Neurology

## 2017-12-14 DIAGNOSIS — I70245 Atherosclerosis of native arteries of left leg with ulceration of other part of foot: Secondary | ICD-10-CM | POA: Diagnosis not present

## 2017-12-21 DIAGNOSIS — I70245 Atherosclerosis of native arteries of left leg with ulceration of other part of foot: Secondary | ICD-10-CM | POA: Diagnosis not present

## 2017-12-23 ENCOUNTER — Ambulatory Visit (INDEPENDENT_AMBULATORY_CARE_PROVIDER_SITE_OTHER): Payer: Self-pay | Admitting: Vascular Surgery

## 2017-12-23 ENCOUNTER — Other Ambulatory Visit: Payer: Self-pay

## 2017-12-23 ENCOUNTER — Encounter: Payer: Self-pay | Admitting: Vascular Surgery

## 2017-12-23 VITALS — BP 131/59 | HR 74 | Temp 96.8°F | Resp 18 | Ht 71.0 in | Wt 256.0 lb

## 2017-12-23 DIAGNOSIS — I739 Peripheral vascular disease, unspecified: Secondary | ICD-10-CM

## 2017-12-23 DIAGNOSIS — T8149XA Infection following a procedure, other surgical site, initial encounter: Secondary | ICD-10-CM

## 2017-12-23 NOTE — Progress Notes (Addendum)
    Postoperative Visit   History of Present Illness   [name] is a 77 y.o. year old male who presents for postoperative follow-up for: L CFA endarterectomy and fem-pop bypass with vein with subsequent hospitalization for wound debridement x2 and IV antibiotics.  Surgical history also significant for right lower extremity bypass in 2013 as well as a aorta bifemoral bypass in 2007.  He is still rehabilitating at nursing facility however will be transitioning to [facility] PT and RN in the next week per patient's daughter and wife also present today.  Wound care consists of packing L groin wound daily with wet to dry.  He is still on p.o. Antibiotics and will be until wound is healed.  L GT dry gangrene is stable.  He denies rest pain however still complains of weakness and numbness LLE.  This is currently being worked up by Neurology.  He denies fevers, chills, N/V.  Appetite continues to improve.   For VQI Use Only   PRE-ADM LIVING: Nursing home  AMB STATUS: Ambulatory with Assistance   Physical Examination   Vitals:   12/23/17 1609  BP: (!) 131/59  Pulse: 74  Resp: 18  Temp: (!) 96.8 F (36 C)  TempSrc: Oral  SpO2: 98%  Weight: 256 lb (116.1 kg)  Height: 5\' 11"  (1.803 m)    LLE: L groin open wound with healthy wound bed; 4cm w x 4cm l x 3cm d; no purulence; no graft material exposed; BLE pitting edema to level of knee; palpable L DP; L GT dry gangrene demarcated well without purulence or obvious infection   Medical Decision Making   [name] is a 77 y.o. year old male who presents s/p L CFA endarterectomy and fem-pop bypass with vein with subsequent hospitalization for wound debridement x2 and IV antibiotics .   This patient was seen in conjunction with Dr. Oneida Alar  Patent LLE bypass with palpable L DP  L GT dry gangrene with clear demarcation; no indication for surgical intervention  L groin wound healthy appearing, central cavity continues to shrink, no graft  exposure; continue current wound care  Ok from vascular surgery standpoint to transition to [facility] PT and wound care  Continue Keflex until L groin wound completely healed  Follow up for wound recheck in 4 weeks with Dr. Terri Skains PA-C [facility] [place] Office: [phone]   Agree with above.  Pt will follow up in one month.  Anthony Hinds, MD [facility] [place] Office: [phone] Pager: [phone]

## 2017-12-28 DIAGNOSIS — I70245 Atherosclerosis of native arteries of left leg with ulceration of other part of foot: Secondary | ICD-10-CM | POA: Diagnosis not present

## 2017-12-30 DIAGNOSIS — E1122 Type 2 diabetes mellitus with diabetic chronic kidney disease: Secondary | ICD-10-CM | POA: Diagnosis not present

## 2017-12-30 DIAGNOSIS — I130 Hypertensive heart and chronic kidney disease with heart failure and stage 1 through stage 4 chronic kidney disease, or unspecified chronic kidney disease: Secondary | ICD-10-CM | POA: Diagnosis not present

## 2017-12-30 DIAGNOSIS — I5032 Chronic diastolic (congestive) heart failure: Secondary | ICD-10-CM | POA: Diagnosis not present

## 2017-12-30 DIAGNOSIS — N183 Chronic kidney disease, stage 3 (moderate): Secondary | ICD-10-CM | POA: Diagnosis not present

## 2017-12-30 DIAGNOSIS — E1152 Type 2 diabetes mellitus with diabetic peripheral angiopathy with gangrene: Secondary | ICD-10-CM | POA: Diagnosis not present

## 2017-12-30 DIAGNOSIS — T8149XD Infection following a procedure, other surgical site, subsequent encounter: Secondary | ICD-10-CM | POA: Diagnosis not present

## 2017-12-31 DIAGNOSIS — E1122 Type 2 diabetes mellitus with diabetic chronic kidney disease: Secondary | ICD-10-CM | POA: Diagnosis not present

## 2017-12-31 DIAGNOSIS — T8149XD Infection following a procedure, other surgical site, subsequent encounter: Secondary | ICD-10-CM | POA: Diagnosis not present

## 2017-12-31 DIAGNOSIS — E1152 Type 2 diabetes mellitus with diabetic peripheral angiopathy with gangrene: Secondary | ICD-10-CM | POA: Diagnosis not present

## 2017-12-31 DIAGNOSIS — I5032 Chronic diastolic (congestive) heart failure: Secondary | ICD-10-CM | POA: Diagnosis not present

## 2017-12-31 DIAGNOSIS — I130 Hypertensive heart and chronic kidney disease with heart failure and stage 1 through stage 4 chronic kidney disease, or unspecified chronic kidney disease: Secondary | ICD-10-CM | POA: Diagnosis not present

## 2017-12-31 DIAGNOSIS — N183 Chronic kidney disease, stage 3 (moderate): Secondary | ICD-10-CM | POA: Diagnosis not present

## 2018-01-01 DIAGNOSIS — T8149XD Infection following a procedure, other surgical site, subsequent encounter: Secondary | ICD-10-CM | POA: Diagnosis not present

## 2018-01-01 DIAGNOSIS — I130 Hypertensive heart and chronic kidney disease with heart failure and stage 1 through stage 4 chronic kidney disease, or unspecified chronic kidney disease: Secondary | ICD-10-CM | POA: Diagnosis not present

## 2018-01-01 DIAGNOSIS — E1122 Type 2 diabetes mellitus with diabetic chronic kidney disease: Secondary | ICD-10-CM | POA: Diagnosis not present

## 2018-01-01 DIAGNOSIS — N183 Chronic kidney disease, stage 3 (moderate): Secondary | ICD-10-CM | POA: Diagnosis not present

## 2018-01-01 DIAGNOSIS — I5032 Chronic diastolic (congestive) heart failure: Secondary | ICD-10-CM | POA: Diagnosis not present

## 2018-01-01 DIAGNOSIS — E1152 Type 2 diabetes mellitus with diabetic peripheral angiopathy with gangrene: Secondary | ICD-10-CM | POA: Diagnosis not present

## 2018-01-02 DIAGNOSIS — E1122 Type 2 diabetes mellitus with diabetic chronic kidney disease: Secondary | ICD-10-CM | POA: Diagnosis not present

## 2018-01-02 DIAGNOSIS — N183 Chronic kidney disease, stage 3 (moderate): Secondary | ICD-10-CM | POA: Diagnosis not present

## 2018-01-02 DIAGNOSIS — I130 Hypertensive heart and chronic kidney disease with heart failure and stage 1 through stage 4 chronic kidney disease, or unspecified chronic kidney disease: Secondary | ICD-10-CM | POA: Diagnosis not present

## 2018-01-02 DIAGNOSIS — E1152 Type 2 diabetes mellitus with diabetic peripheral angiopathy with gangrene: Secondary | ICD-10-CM | POA: Diagnosis not present

## 2018-01-02 DIAGNOSIS — T8149XD Infection following a procedure, other surgical site, subsequent encounter: Secondary | ICD-10-CM | POA: Diagnosis not present

## 2018-01-02 DIAGNOSIS — I5032 Chronic diastolic (congestive) heart failure: Secondary | ICD-10-CM | POA: Diagnosis not present

## 2018-01-03 ENCOUNTER — Other Ambulatory Visit: Payer: Self-pay

## 2018-01-03 DIAGNOSIS — N183 Chronic kidney disease, stage 3 (moderate): Secondary | ICD-10-CM | POA: Diagnosis not present

## 2018-01-03 DIAGNOSIS — E1122 Type 2 diabetes mellitus with diabetic chronic kidney disease: Secondary | ICD-10-CM | POA: Diagnosis not present

## 2018-01-03 DIAGNOSIS — I5032 Chronic diastolic (congestive) heart failure: Secondary | ICD-10-CM | POA: Diagnosis not present

## 2018-01-03 DIAGNOSIS — E1152 Type 2 diabetes mellitus with diabetic peripheral angiopathy with gangrene: Secondary | ICD-10-CM | POA: Diagnosis not present

## 2018-01-03 DIAGNOSIS — T8149XD Infection following a procedure, other surgical site, subsequent encounter: Secondary | ICD-10-CM | POA: Diagnosis not present

## 2018-01-03 DIAGNOSIS — I130 Hypertensive heart and chronic kidney disease with heart failure and stage 1 through stage 4 chronic kidney disease, or unspecified chronic kidney disease: Secondary | ICD-10-CM | POA: Diagnosis not present

## 2018-01-03 NOTE — Patient Outreach (Signed)
[facility]) Care Management  01/03/2018  [name] 09/27/1940 [id number]   Transition of care  Referral date: 01/03/18 Referral source: discharged from [place]:  [place] call to patient regarding transition of care referral. HIPAA verified.  Explained reason for call. Patient states he was in the hospital to have fem pop surgery on his left leg.  Patient states he was transferred from hospital to nursing home for wound care and therapy.  Patient gave verbal permission to speak with his wife, [name] regarding his health information.  Wife states patients wound are healing well.  Wife states home health services with [facility] have started for patient. Wife states patient has all of his medication and is taking medication as prescribed. Wife states patient has a follow up appointment with his primary MD on 01/07/18.  Wife states patient has transportation and will require the assistance of her son for the upcoming doctor appointment. Wife states patient has a follow up with the surgeon at the end of August 2019. RNCM discussed signs of infection. Advised to follow up with doctor / surgeon for symptoms. Wife verbalized understanding.  RNCM discussed and offered [facility] care management services. Wife declined. Wife states their daughter is a Marine scientist who is following patient closely.  RNCM advised patient to notify MD of any changes in condition prior to scheduled appointment. RNCM provided contact name and number: [phone] or main office number [phone] and 24 hour nurse advise line [phone].  RNCM verified patient aware of 911 services for urgent/ emergent needs.   PLAN: RNCM; will close patient due to refusal of services RNCM will send patients primary MD closure notification  RNCM will send patient [facility] care management brochure/ magnet  Quinn Plowman RN,BSN,CCM [facility] Telephonic  [phone]

## 2018-01-04 DIAGNOSIS — I5032 Chronic diastolic (congestive) heart failure: Secondary | ICD-10-CM | POA: Diagnosis not present

## 2018-01-04 DIAGNOSIS — E1122 Type 2 diabetes mellitus with diabetic chronic kidney disease: Secondary | ICD-10-CM | POA: Diagnosis not present

## 2018-01-04 DIAGNOSIS — I130 Hypertensive heart and chronic kidney disease with heart failure and stage 1 through stage 4 chronic kidney disease, or unspecified chronic kidney disease: Secondary | ICD-10-CM | POA: Diagnosis not present

## 2018-01-04 DIAGNOSIS — T8149XD Infection following a procedure, other surgical site, subsequent encounter: Secondary | ICD-10-CM | POA: Diagnosis not present

## 2018-01-04 DIAGNOSIS — E1152 Type 2 diabetes mellitus with diabetic peripheral angiopathy with gangrene: Secondary | ICD-10-CM | POA: Diagnosis not present

## 2018-01-04 DIAGNOSIS — N183 Chronic kidney disease, stage 3 (moderate): Secondary | ICD-10-CM | POA: Diagnosis not present

## 2018-01-05 DIAGNOSIS — I130 Hypertensive heart and chronic kidney disease with heart failure and stage 1 through stage 4 chronic kidney disease, or unspecified chronic kidney disease: Secondary | ICD-10-CM | POA: Diagnosis not present

## 2018-01-05 DIAGNOSIS — I5032 Chronic diastolic (congestive) heart failure: Secondary | ICD-10-CM | POA: Diagnosis not present

## 2018-01-05 DIAGNOSIS — E1122 Type 2 diabetes mellitus with diabetic chronic kidney disease: Secondary | ICD-10-CM | POA: Diagnosis not present

## 2018-01-05 DIAGNOSIS — T8149XD Infection following a procedure, other surgical site, subsequent encounter: Secondary | ICD-10-CM | POA: Diagnosis not present

## 2018-01-05 DIAGNOSIS — E1152 Type 2 diabetes mellitus with diabetic peripheral angiopathy with gangrene: Secondary | ICD-10-CM | POA: Diagnosis not present

## 2018-01-05 DIAGNOSIS — N183 Chronic kidney disease, stage 3 (moderate): Secondary | ICD-10-CM | POA: Diagnosis not present

## 2018-01-06 DIAGNOSIS — E1122 Type 2 diabetes mellitus with diabetic chronic kidney disease: Secondary | ICD-10-CM | POA: Diagnosis not present

## 2018-01-06 DIAGNOSIS — T8149XD Infection following a procedure, other surgical site, subsequent encounter: Secondary | ICD-10-CM | POA: Diagnosis not present

## 2018-01-06 DIAGNOSIS — N183 Chronic kidney disease, stage 3 (moderate): Secondary | ICD-10-CM | POA: Diagnosis not present

## 2018-01-06 DIAGNOSIS — E1152 Type 2 diabetes mellitus with diabetic peripheral angiopathy with gangrene: Secondary | ICD-10-CM | POA: Diagnosis not present

## 2018-01-06 DIAGNOSIS — I130 Hypertensive heart and chronic kidney disease with heart failure and stage 1 through stage 4 chronic kidney disease, or unspecified chronic kidney disease: Secondary | ICD-10-CM | POA: Diagnosis not present

## 2018-01-06 DIAGNOSIS — I5032 Chronic diastolic (congestive) heart failure: Secondary | ICD-10-CM | POA: Diagnosis not present

## 2018-01-07 ENCOUNTER — Ambulatory Visit (INDEPENDENT_AMBULATORY_CARE_PROVIDER_SITE_OTHER): Payer: Medicare HMO | Admitting: Neurology

## 2018-01-07 ENCOUNTER — Encounter: Payer: Self-pay | Admitting: Neurology

## 2018-01-07 VITALS — BP 110/70 | HR 43 | Ht 71.0 in | Wt 264.0 lb

## 2018-01-07 DIAGNOSIS — G63 Polyneuropathy in diseases classified elsewhere: Secondary | ICD-10-CM | POA: Diagnosis not present

## 2018-01-07 DIAGNOSIS — E1121 Type 2 diabetes mellitus with diabetic nephropathy: Secondary | ICD-10-CM | POA: Diagnosis not present

## 2018-01-07 DIAGNOSIS — M21372 Foot drop, left foot: Secondary | ICD-10-CM | POA: Diagnosis not present

## 2018-01-07 DIAGNOSIS — I130 Hypertensive heart and chronic kidney disease with heart failure and stage 1 through stage 4 chronic kidney disease, or unspecified chronic kidney disease: Secondary | ICD-10-CM | POA: Diagnosis not present

## 2018-01-07 DIAGNOSIS — I739 Peripheral vascular disease, unspecified: Secondary | ICD-10-CM | POA: Diagnosis not present

## 2018-01-07 DIAGNOSIS — E039 Hypothyroidism, unspecified: Secondary | ICD-10-CM | POA: Diagnosis not present

## 2018-01-07 DIAGNOSIS — Z7984 Long term (current) use of oral hypoglycemic drugs: Secondary | ICD-10-CM | POA: Diagnosis not present

## 2018-01-07 DIAGNOSIS — E1151 Type 2 diabetes mellitus with diabetic peripheral angiopathy without gangrene: Secondary | ICD-10-CM | POA: Diagnosis not present

## 2018-01-07 DIAGNOSIS — E1122 Type 2 diabetes mellitus with diabetic chronic kidney disease: Secondary | ICD-10-CM | POA: Diagnosis not present

## 2018-01-07 DIAGNOSIS — E1129 Type 2 diabetes mellitus with other diabetic kidney complication: Secondary | ICD-10-CM | POA: Diagnosis not present

## 2018-01-07 DIAGNOSIS — R413 Other amnesia: Secondary | ICD-10-CM | POA: Diagnosis not present

## 2018-01-07 DIAGNOSIS — N183 Chronic kidney disease, stage 3 (moderate): Secondary | ICD-10-CM | POA: Diagnosis not present

## 2018-01-07 DIAGNOSIS — I5030 Unspecified diastolic (congestive) heart failure: Secondary | ICD-10-CM | POA: Diagnosis not present

## 2018-01-07 DIAGNOSIS — I129 Hypertensive chronic kidney disease with stage 1 through stage 4 chronic kidney disease, or unspecified chronic kidney disease: Secondary | ICD-10-CM | POA: Diagnosis not present

## 2018-01-07 DIAGNOSIS — S31109D Unspecified open wound of abdominal wall, unspecified quadrant without penetration into peritoneal cavity, subsequent encounter: Secondary | ICD-10-CM | POA: Diagnosis not present

## 2018-01-07 DIAGNOSIS — E1142 Type 2 diabetes mellitus with diabetic polyneuropathy: Secondary | ICD-10-CM | POA: Diagnosis not present

## 2018-01-07 NOTE — Progress Notes (Signed)
Follow-up Visit   Date: 01/07/18    [name] MRN: [id number] DOB: 1940/07/28   Interim History: [name] is a 77 y.o. right-handed Caucasian male with CAD, PAD, bilateral carotid disease s/p CEA, hypertension, hyperlipidemia, hypothyroidism, former smoker, and diabetes mellitus returning to the clinic for follow-up of neuropathy and memory changes.  The patient was accompanied to the clinic by wife and son who also provides collateral information.    History of present illness: Starting around 2015, he began having problems with imbalance which has steadily worsened over the past few years.   Also over this time, he has become aware that his legs falls asleep frequently and has lack of sensation.  He finds himself often looking at his feet when walking, so that he is aware of their position.  He has suffered 16 falls over the past 3 years; the frequency has increased over the past 3-4 months.  He has fractured his right arm twice and with his fall last week, fractured the right orbit.  He always requires assistance to stand up from a fall, and often times, someone has to manually place his legs under him, in order to stand up.  His wife has noticed that when he walks, his left foot drags and slaps the floor.  He did physical therapy for gait training summer of 2018.  He has started using a shower chair because of imbalance. He has long history of peripheral arterial disease and diabetes mellitus.  His diabetes is well-controlled.  He drinks 1-2 drinks per day (liquor).    UPDATE 01/07/2018:  Since his last visit in March, he was found to have critical limb ischemia of the left leg and underwent left common femoral endarterectomy, left femoral to below-knee popliteal bypass in June 2019.  This was complicated by chronic left left groin infection and he continues to be on antibiotics for this.  At his last visit, his NCS/EMG showed severe sensorimotor polyneuropathy, worse on the  left, most likely due to him having severe PAD in the left leg.  He is back at home and gets home health nursing and PT.  He is able to walk with a walker and uses a left AFO for support.  He uses a wheelchair for long distances.  He is not falling as much as before, but this is also because he is less active.    He complains of severe edema of the legs, shortness, of breath, and fatigue.  He saw Dr. Laurann Montana this morning who increased lasix to 40mg  four times daily and asked him to follow-up with Dr. Daneen Schick, his cardiologist.    He also underwent MRI brain which shows generalized atrophy and chronic microvascular changes, no findings of NPH. Wife states that he can be confused and disoriented sometimes, especially in the morning.  Patient also has noticed problems with memory and increased forgetfulness.    He continues to have problems with bowel and bladder incontinence.   Medications:  Current Outpatient Medications on File Prior to Visit  Medication Sig Dispense Refill  . aspirin 325 MG EC tablet Take 325 mg by mouth every evening.     Marland Kitchen atorvastatin (LIPITOR) 20 MG tablet Take 20 mg by mouth every evening.     . cephALEXin (KEFLEX) 500 MG capsule Take 1 capsule (500 mg total) by mouth every 12 (twelve) hours.    . cetirizine (ZYRTEC ALLERGY) 10 MG tablet Take 1 tablet (10 mg total) by mouth daily  as needed for allergies.    . Cyanocobalamin (VITAMIN B 12 PO) Take 500 mcg by mouth daily.     Marland Kitchen erythromycin ophthalmic ointment USE A SMALL AMOUNT IN THE LEFT EYE DAILY AS NEEDED FOR IRRITATION  2  . fenofibrate (TRICOR) 145 MG tablet Take 145 mg by mouth every morning.    . ferrous sulfate 325 (65 FE) MG tablet Take 1 tablet (325 mg total) by mouth 3 (three) times daily with meals.    . furosemide (LASIX) 40 MG tablet Take 1 tablet (40 mg total) by mouth daily.    Marland Kitchen gabapentin (NEURONTIN) 300 MG capsule Take 1 capsule (300 mg total) by mouth at bedtime. Take 600 mg every morning and 300 mg  every night    . HYDROcodone-acetaminophen (NORCO/VICODIN) 5-325 MG tablet Take 1 tablet by mouth every 6 (six) hours as needed for severe pain. 20 tablet 0  . levothyroxine (SYNTHROID, LEVOTHROID) 50 MCG tablet Take 50 mcg by mouth daily before breakfast.    . loperamide (IMODIUM A-D) 2 MG tablet Take 2 mg by mouth daily.    . methylcellulose (ARTIFICIAL TEARS) 1 % ophthalmic solution Place 1 drop into both eyes 2 (two) times daily as needed (dry eyes).    . metoprolol tartrate (LOPRESSOR) 50 MG tablet Take 1 tablet (50 mg total) by mouth 2 (two) times daily.    . Multiple Vitamin (MULTIVITAMIN WITH MINERALS) TABS tablet Take 1 tablet by mouth daily.    . nitroGLYCERIN (NITROSTAT) 0.4 MG SL tablet PLACE 1 TABLET UNDER TONGUE EVERY 5 MINUTES AS NEEDED FOR CHEST PAIN. 75 tablet 1  . oxymetazoline (VICKS SINEX) 0.05 % nasal spray Place 1 spray into both nostrils 2 (two) times daily as needed for congestion.    . pioglitazone (ACTOS) 30 MG tablet Take 1 tablet (30 mg total) by mouth daily with lunch.    . potassium chloride SA (K-DUR,KLOR-CON) 20 MEQ tablet Take 20 mEq by mouth daily as needed (with Lasix).     . pseudoephedrine-guaifenesin (MUCINEX D) 60-600 MG per tablet Take 1 tablet by mouth every 12 (twelve) hours as needed for congestion.     . senna-docusate (SENOKOT-S) 8.6-50 MG tablet Take 1 tablet by mouth at bedtime as needed for mild constipation.    . sodium chloride (OCEAN) 0.65 % SOLN nasal spray Place 1 spray into both nostrils 2 (two) times daily as needed for congestion.    . tamsulosin (FLOMAX) 0.4 MG CAPS capsule Take 1 capsule (0.4 mg total) by mouth daily.    . temazepam (RESTORIL) 15 MG capsule Take 15 mg by mouth at bedtime as needed for sleep.     Marland Kitchen UNABLE TO FIND Apply 1 application topically every 6 (six) hours as needed (pain). Med Name: *Ibuprofen 15% Baclofen 1% Lidocaine 10% Gabapentin 3% Topical Cream** To wrist    . zinc gluconate 50 MG tablet Take 50 mg by mouth daily.      No current facility-administered medications on file prior to visit.     Allergies:  Allergies  Allergen Reactions  . Amoxicillin-Pot Clavulanate Nausea Only    Has patient had a PCN reaction causing immediate rash, facial/tongue/throat swelling, SOB or lightheadedness with hypotension: No Has patient had a PCN reaction causing severe rash involving mucus membranes or skin necrosis: No Has patient had a PCN reaction that required hospitalization: No Has patient had a PCN reaction occurring within the last 10 years: No If all of the above answers are "NO", then may  proceed with Cephalosporin use.   Marland Kitchen Morphine And Related Other (See Comments)    hallucinates    Review of Systems:  CONSTITUTIONAL: No fevers, chills, night sweats, or weight loss.  EYES: No visual changes or eye pain ENT: No hearing changes.  No history of nose bleeds.   RESPIRATORY: No cough, wheezing +shortness of breath.   CARDIOVASCULAR: Negative for chest pain, and palpitations.   GI: Negative for abdominal discomfort, blood in stools or black stools.  No recent change in bowel habits.   GU:  +history of incontinence.   MUSCLOSKELETAL: +history of joint pain ++swelling.  No myalgias.   SKIN: Negative for lesions, rash, and itching.   ENDOCRINE: Negative for cold or heat intolerance, polydipsia or goiter.   PSYCH:  No depression or anxiety symptoms.   NEURO: As Above.   Vital Signs:  BP 110/70   Pulse (!) 43   Ht 5\' 11"  (1.803 m)   Wt 264 lb (119.7 kg)   SpO2 95%   BMI 36.82 kg/m   General Medical Exam:   General:  Well appearing, comfortable sitting in wheelchair  Eyes/ENT: see cranial nerve examination.   Neck: No masses appreciated.  No carotid bruits. Respiratory:  Bibasilar rales, good air entry bilaterally.   Cardiac:  Bradycardic rate, regular rhythm, no murmur.   Ext:  Severe pitting edema of the legs, left foot drop supported in AFO  Neurological Exam: MENTAL STATUS including  orientation to time, place, person, recent and remote memory, attention span and concentration, language, and fund of knowledge is normal.  Speech is not dysarthric. Montreal Cognitive Assessment  01/07/2018  Visuospatial/ Executive (0/5) 4  Naming (0/3) 3  Attention: Read list of digits (0/2) 2  Attention: Read list of letters (0/1) 1  Attention: Serial 7 subtraction starting at 100 (0/3) 3  Language: Repeat phrase (0/2) 2  Language : Fluency (0/1) 1  Abstraction (0/2) 2  Delayed Recall (0/5) 3  Orientation (0/6) 5  Total 26  Adjusted Score (based on education) 26    CRANIAL NERVES:   Pupils equal round and reactive to light.  Normal conjugate, extra-ocular eye movements in all directions of gaze.  No ptosis. Face is symmetric. Palate elevates symmetrically.  Tongue is midline.   MOTOR: No atrophy, fasciculations or abnormal movements.  No pronator drift.  Tone is normal.    Right Upper Extremity:    Left Upper Extremity:    Deltoid  5/5   Deltoid  5/5   Biceps  5/5   Biceps  5/5   Triceps  5/5   Triceps  5/5   Wrist extensors  5/5   Wrist extensors  5/5   Wrist flexors  5/5   Wrist flexors  5/5   Finger extensors  5/5   Finger extensors  5/5   Finger flexors  5/5   Finger flexors  5/5   Dorsal interossei  4/5   Dorsal interossei  4/5   Abductor pollicis  4/5   Abductor pollicis  4/5   Tone (Ashworth scale)  0  Tone (Ashworth scale)  0   Right Lower Extremity:    Left Lower Extremity:    Hip flexors  3+/5   Hip flexors  3+/5   Hip extensors  5/5   Hip extensors  5/5   Knee flexors  5/5   Knee flexors  5/5   Knee extensors  5/5   Knee extensors  5/5   Dorsiflexors  5-/5   Dorsiflexors  4-/5   Plantarflexors  5/5   Plantarflexors  4/5   Toe extensors  4/5   Toe extensors  3/5   Tone (Ashworth scale)  0  Tone (Ashworth scale)  0   MSRs:  Reflexes are 2+/4 in the arms and absent in the legs  SENSORY:  Vibration is absent at the knees and ankles, reduced at the hands.   Temperature is reduced distal to knees.   COORDINATION/GAIT:  Gait not tested as patient is wheelchair and it is difficult for him to stand up.   Data: NCS/EMG of the legs 08/31/2017: The electrophysiologic findings are most consistent with a chronic and severe sensorimotor axonal polyneuropathy affecting the lower extremities, which is worse on the left.  A superimposed multilevel intraspinal canal lesion process affecting the L3-S1 myotomes cannot be excluded.  MRI brain 09/21/2017:  No acute abnormality. Progression of atrophy and chronic microvascular ischemia since 2014  MRI thoracic spine 08/11/2017: 1. No acute traumatic injury within the thoracic spine. 2. Mild multilevel degenerative disc bulging at T3-4 through T9-10 as above without significant stenosis. 3. Small layering bilateral pleural effusions.  MR LUMBAR SPINE 08/11/2017: 1. No acute traumatic injury within the lumbar spine. 2. Multilevel degenerative spondylolysis with resultant mild to moderate canal and lateral recess stenosis at L2-3 and L3-4. Mild bilateral foraminal narrowing at L2 through L4. 3. **An incidental finding of potential clinical significance has been found. Aneurysmal dilatation of the infrarenal aorta up to 3 cm.   IMPRESSION/PLAN: Peripheral neuropathy due to diabetes and PAD, worse in the left leg and with residual left foot drop.  The asymmetry of weakness in the left leg is due to severe PAD involving the left leg.  Hopefully, there will be some improvement since flow is restored however, simply based on the severity of electrodiagnostic testing, prognosis is guarded.  Continue PT.   Memory changes, most consistent with delirium.  Reassured patient that hopefully once he is off antibiotics and infection has cleared, symptoms should improve.  He may also have some degree of OSA and has not had sleep study.   Although he performed well on cognitive screening evaluation (26/30), memory changes can be  underestimated in individuals with high intellectual ability and therefore will proceed with formal neurocognitive testing.   MRI brain which did not show hydrocephalus.  He has generalized atrophy and white matter changes.   Severe lymphedema secondary to CHF is also contributing to leg heaviness and weakness.  If his hip flexion weakness persists, despite adequate management of edema and physical therapy, need to consider whether his neuropathy or lumbosacral radiculopathy is getting worse.   He will be seeing cardiology for further evaluation of CHF, in the meantime, Dr Laurann Montana has asked him to increase lasix to 40mg  four times daily.  If shortness of breath gets worse, he was instructed to go to the ER.   Patient and his family had many questions, which were answered to the best of my ability.   Return to clinic after testing   The duration of this appointment visit was 40 minutes of face-to-face time with the patient.  Greater than 50% of this time was spent in counseling, explanation of diagnosis, planning of further management, and coordination of care.   Thank you for allowing me to participate in patient's care.  If I can answer any additional questions, I would be pleased to do so.    Sincerely,    Kayron Hicklin K. Posey Pronto, DO

## 2018-01-08 DIAGNOSIS — I5032 Chronic diastolic (congestive) heart failure: Secondary | ICD-10-CM | POA: Diagnosis not present

## 2018-01-08 DIAGNOSIS — E1152 Type 2 diabetes mellitus with diabetic peripheral angiopathy with gangrene: Secondary | ICD-10-CM | POA: Diagnosis not present

## 2018-01-08 DIAGNOSIS — E1122 Type 2 diabetes mellitus with diabetic chronic kidney disease: Secondary | ICD-10-CM | POA: Diagnosis not present

## 2018-01-08 DIAGNOSIS — N183 Chronic kidney disease, stage 3 (moderate): Secondary | ICD-10-CM | POA: Diagnosis not present

## 2018-01-08 DIAGNOSIS — I130 Hypertensive heart and chronic kidney disease with heart failure and stage 1 through stage 4 chronic kidney disease, or unspecified chronic kidney disease: Secondary | ICD-10-CM | POA: Diagnosis not present

## 2018-01-08 DIAGNOSIS — T8149XD Infection following a procedure, other surgical site, subsequent encounter: Secondary | ICD-10-CM | POA: Diagnosis not present

## 2018-01-10 ENCOUNTER — Encounter: Payer: Self-pay | Admitting: Psychology

## 2018-01-10 ENCOUNTER — Ambulatory Visit: Payer: Medicare HMO | Admitting: Psychology

## 2018-01-10 ENCOUNTER — Ambulatory Visit (INDEPENDENT_AMBULATORY_CARE_PROVIDER_SITE_OTHER): Payer: Medicare HMO | Admitting: Psychology

## 2018-01-10 DIAGNOSIS — N183 Chronic kidney disease, stage 3 (moderate): Secondary | ICD-10-CM | POA: Diagnosis not present

## 2018-01-10 DIAGNOSIS — E1122 Type 2 diabetes mellitus with diabetic chronic kidney disease: Secondary | ICD-10-CM | POA: Diagnosis not present

## 2018-01-10 DIAGNOSIS — I5032 Chronic diastolic (congestive) heart failure: Secondary | ICD-10-CM | POA: Diagnosis not present

## 2018-01-10 DIAGNOSIS — R413 Other amnesia: Secondary | ICD-10-CM

## 2018-01-10 DIAGNOSIS — E1152 Type 2 diabetes mellitus with diabetic peripheral angiopathy with gangrene: Secondary | ICD-10-CM | POA: Diagnosis not present

## 2018-01-10 DIAGNOSIS — T8149XD Infection following a procedure, other surgical site, subsequent encounter: Secondary | ICD-10-CM | POA: Diagnosis not present

## 2018-01-10 DIAGNOSIS — I130 Hypertensive heart and chronic kidney disease with heart failure and stage 1 through stage 4 chronic kidney disease, or unspecified chronic kidney disease: Secondary | ICD-10-CM | POA: Diagnosis not present

## 2018-01-10 NOTE — Progress Notes (Signed)
   Neuropsychology Note  [name] completed 90 minutes of neuropsychological testing with technician, Milana Kidney, BS, under the supervision of Dr. Macarthur Critchley, Licensed Psychologist. The patient did not appear overtly distressed by the testing session, per behavioral observation or via self-report to the technician. Rest breaks were offered.   Clinical Decision Making: In considering the patient's current level of functioning, level of presumed impairment, nature of symptoms, emotional and behavioral responses during the interview, level of literacy, and observed level of motivation/effort, a battery of tests was selected and communicated to the psychometrician.  Communication between the psychologist and technician was ongoing throughout the testing session and changes were made as deemed necessary based on patient performance on testing, technician observations and additional pertinent factors such as those listed above.  [name] will return within approximately 2 weeks for an interactive feedback session with Dr. Si Raider at which time his test performances, clinical impressions and treatment recommendations will be reviewed in detail. The patient understands he can contact our office should he require our assistance before this time.  35 minutes spent performing neuropsychological evaluation services/clinical decision making (psychologist). [CPT 86761] 95 minutes spent face-to-face with patient administering standardized tests, 30 minutes spent scoring (technician). [CPT [id number], 09326]  Full report to follow.

## 2018-01-10 NOTE — Progress Notes (Signed)
NEUROBEHAVIORAL STATUS EXAM   Name: TORRENCE HAMMACK Date of Birth: 08/03/1940 Date of Interview: 01/10/2018  Reason for Referral:  BECKHAM CAPISTRAN is a 77 y.o. male who is referred for neuropsychological evaluation by Dr. Narda Amber of [facility] due to concerns about memory changes. This patient is accompanied in the office by his wife who supplements the history.  History of Presenting Problem:  Mr. Ploeger has a history of CAD, PAD, bilateral carotid disease s/p CEA, hypertension, hyperlipidemia, hypothyroidism, and diabetes. He has had problems with balance since around 2015 and has had multiple falls since then. He was seen by Dr. Posey Pronto initially in March 2019 for neuropathy and memory changes. Bladder and bowel incontinence was also noted. NCS/EMG of the legs suggested chronic and severe sensorimotor axonal polyneuropathy worse on the left. Subsequently he was found to have critical limb ischemia of the left leg and underwent left common femoral endarterectomy, left femoral to below-knee popliteal bypass in June 2019.  This was complicated by chronic left groin infection. He was discharged from rehabilitation facility August 8. He followed up with Dr. Posey Pronto on 01/07/2018 and his wife reported cognitive changes possibly suggestive of delirium. Dr. Posey Pronto reassured them that hopefully once he is off antibiotics and infections has cleared cognitive symptoms should improve. MoCA on 01/07/2018 was 26/30 but Dr. Posey Pronto noted that screening measures can underestimate cognitive changes in individuals with high intellectual ability and therefore he was referred for formal neurocognitive evaluation.  At today's visit (01/10/2018), the patient reports that he stopped practicing law in 2013 secondary to short term memory loss after coronary bypass surgery. He feels memory abilities have stayed relatively stable since that time. His wife reports she also noticed memory and word-finding difficulties  after the procedure in 2013 but she feels they have gotten worse. She noted that just last week he did not recognize a physical therapist he had seen two days prior. She feels he is having trouble processing information. He may not comprehend what she is telling him the first time through. He forgets recent conversations or things he has been told, but cueing usually helps. He is not repeating himself. He is having word finding difficulty.   He has had acute episodes of confusion and disorientation while in the hospital and rehabilitation facility. He would have moments of not knowing where he was. Also, at home, he will wake up from a nap and be briefly disoriented. He may ask his wife which way to go to get to the bathroom. However his mental status clears quickly. He has not had any hallucinations during these episodes. He has had hallucinations in the past after other surgeries when he was given morphine.   The patient lives at home with his wife. He has home health nursing and PT. He had a bad fall in March 2019 and has not driven since then. He wants to return to driving. Up until recently he was managing his medications; his wife is doing this now. His wife has been doing the finances for a long time. She also prepares meals and keeps track of his appointments.  His father had dementia in his old age, prior to dying at age 74. Otherwise there is no known family history of dementia or Alzheimer's disease.  The patient continues to have falls. He fell on June 10 while in the hospital and on August 6 in the rehabilitation facility. He fell at home in the middle of the night two nights  ago and hit his arm. He did not hit his head. He denies any history of LOC in the context of any of his falls.  He denies depressed mood. He expresses irritability with PT and home health providers as he does not feel they are helping enough. He denies prior psychiatric history. He has never been treated for a mental  health condition. He denies suicidal ideation or intention. His appetite is good. He dozes off frequently during the day. His daughter has been concerned about sleep apnea but he does not wish to have a sleep study performed. He has shortness of breath especially when he is not laying upright. He does not sleep well at night. He has felt very tired lately.    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 He loved his career and "misses it every day". He retired in 2013 after coronary bypass surgery. Marital history: He has been married twice. He and his wife have been married for 40 years. He has one son and one daughter and three step sons.  Alcohol: He typically has a martini some evenings (not daily). Recently he has had much less since he has been in the hospital and nursing home.  Tobacco: Former smoker   Medical History: Past Medical History:  Diagnosis Date  . Arthritis   . Atherosclerotic heart disease   . Back pain   . Bruises easily    takes Pletal daily and ASA 325mg  daily  . Cataract immature    right  . Chest pain   . Colon polyps   . Complication of anesthesia    hard to wake up with bypass surgery  . Cyst    on perineum;takes Doxycycline daily  . Diabetes mellitus    takes Glipizide,Metformin,and Actos daily  . Diarrhea   . Dyspnea    when lying flat  . Edema    right leg knee down swollen since fall 3 wks ago  . Foot drop, left   . GERD (gastroesophageal reflux disease)    Rolaids as needed-occ reflux  . Hemorrhoids   . History of kidney stones    at age 7   . Hyperlipidemia    takes Tricor and Lipitor daily  . Hypertension    takes Altace,Amlodipine,and Nadolol daily  . Hypothyroidism   . Kidney stone    35+yrs ago  . Muscle pain   . Nasal polyps    hx of  . Peripheral edema    wears knee length hose-told by podiatrist  to wear these  . Peripheral neuropathy    takes Gabapentin daily  . Pneumonia    as a child  . PVD (peripheral vascular disease) ([place])   . Renal insufficiency   . Seasonal allergies    takes Mucinex and Zyrtec prn  . Urinary frequency    urgency/takes Vesicare daily      Current Medications:  Outpatient Encounter Medications as of 01/10/2018  Medication Sig  . aspirin 325 MG EC tablet Take 325 mg by mouth every evening.   Marland Kitchen atorvastatin (LIPITOR) 20 MG tablet Take 20 mg by mouth every evening.   . cephALEXin (KEFLEX) 500 MG capsule Take 1 capsule (500 mg total) by mouth every 12 (twelve) hours.  . cetirizine (ZYRTEC ALLERGY) 10 MG tablet Take 1 tablet (10 mg total) by mouth daily as needed for allergies.  . Cyanocobalamin (VITAMIN B 12 PO) Take 500 mcg by mouth  daily.   . erythromycin ophthalmic ointment USE A SMALL AMOUNT IN THE LEFT EYE DAILY AS NEEDED FOR IRRITATION  . fenofibrate (TRICOR) 145 MG tablet Take 145 mg by mouth every morning.  . ferrous sulfate 325 (65 FE) MG tablet Take 1 tablet (325 mg total) by mouth 3 (three) times daily with meals.  . furosemide (LASIX) 40 MG tablet Take 1 tablet (40 mg total) by mouth daily.  Marland Kitchen gabapentin (NEURONTIN) 300 MG capsule Take 1 capsule (300 mg total) by mouth at bedtime. Take 600 mg every morning and 300 mg every night  . HYDROcodone-acetaminophen (NORCO/VICODIN) 5-325 MG tablet Take 1 tablet by mouth every 6 (six) hours as needed for severe pain.  Marland Kitchen levothyroxine (SYNTHROID, LEVOTHROID) 50 MCG tablet Take 50 mcg by mouth daily before breakfast.  . loperamide (IMODIUM A-D) 2 MG tablet Take 2 mg by mouth daily.  . methylcellulose (ARTIFICIAL TEARS) 1 % ophthalmic solution Place 1 drop into both eyes 2 (two) times daily as needed (dry eyes).  . metoprolol tartrate (LOPRESSOR) 50 MG tablet Take 1 tablet (50 mg total) by mouth 2 (two) times daily.  . Multiple Vitamin (MULTIVITAMIN WITH MINERALS) TABS tablet Take 1 tablet by mouth daily.    . nitroGLYCERIN (NITROSTAT) 0.4 MG SL tablet PLACE 1 TABLET UNDER TONGUE EVERY 5 MINUTES AS NEEDED FOR CHEST PAIN.  Marland Kitchen oxymetazoline (VICKS SINEX) 0.05 % nasal spray Place 1 spray into both nostrils 2 (two) times daily as needed for congestion.  . pioglitazone (ACTOS) 30 MG tablet Take 1 tablet (30 mg total) by mouth daily with lunch.  . potassium chloride SA (K-DUR,KLOR-CON) 20 MEQ tablet Take 20 mEq by mouth daily as needed (with Lasix).   . pseudoephedrine-guaifenesin (MUCINEX D) 60-600 MG per tablet Take 1 tablet by mouth every 12 (twelve) hours as needed for congestion.   . senna-docusate (SENOKOT-S) 8.6-50 MG tablet Take 1 tablet by mouth at bedtime as needed for mild constipation.  . sodium chloride (OCEAN) 0.65 % SOLN nasal spray Place 1 spray into both nostrils 2 (two) times daily as needed for congestion.  . tamsulosin (FLOMAX) 0.4 MG CAPS capsule Take 1 capsule (0.4 mg total) by mouth daily.  . temazepam (RESTORIL) 15 MG capsule Take 15 mg by mouth at bedtime as needed for sleep.   Marland Kitchen UNABLE TO FIND Apply 1 application topically every 6 (six) hours as needed (pain). Med Name: *Ibuprofen 15% Baclofen 1% Lidocaine 10% Gabapentin 3% Topical Cream** To wrist  . zinc gluconate 50 MG tablet Take 50 mg by mouth daily.   No facility-administered encounter medications on file as of 01/10/2018.      Behavioral Observations:   Appearance: Casually dressed and appropriately groomed Gait: Seated in a wheelchair. (Required 3 people to help him transfer from wheelchair to office chair for the testing appointment.) Speech: Fluent; normal rate, rhythm and volume. No significant word finding difficulty observed during conversational speech. Thought process: Linear, goal directed Affect: Blunted. Initially somewhat irritable but softened throughout the interview. Interpersonal: Pleasant, appropriate   50 minutes spent face-to-face with patient completing neurobehavioral status exam. 30 minutes spent  integrating medical records/clinical data and completing this report. [id number] unit.   TESTING: There is medical necessity to proceed with neuropsychological assessment as the results will be used to aid in differential diagnosis and clinical decision-making and to inform specific treatment recommendations. Per the patient, his wife and medical records reviewed, there has been a change in cognitive functioning and a reasonable suspicion of neurocognitive  disorder (probable vascular cognitive impairment with superimposed encephalopathy/delirium in the context of recent infection and hospitalizations).  Clinical Decision Making: In considering the patient's current level of functioning, level of presumed impairment, nature of symptoms, emotional and behavioral responses during the interview, level of literacy, and observed level of motivation, a battery of tests was selected and communicated to the psychometrician.   Following the clinical interview/neurobehavioral status exam, the patient completed this full battery of neuropsychological testing with my psychometrician under my supervision (see separate note).   PLAN: The patient will return to see me for a follow-up session at which time his test performances and my impressions and treatment recommendations will be reviewed in detail.  Evaluation ongoing; full report to follow.

## 2018-01-11 DIAGNOSIS — E1122 Type 2 diabetes mellitus with diabetic chronic kidney disease: Secondary | ICD-10-CM | POA: Diagnosis not present

## 2018-01-11 DIAGNOSIS — T8149XD Infection following a procedure, other surgical site, subsequent encounter: Secondary | ICD-10-CM | POA: Diagnosis not present

## 2018-01-11 DIAGNOSIS — I5032 Chronic diastolic (congestive) heart failure: Secondary | ICD-10-CM | POA: Diagnosis not present

## 2018-01-11 DIAGNOSIS — E1152 Type 2 diabetes mellitus with diabetic peripheral angiopathy with gangrene: Secondary | ICD-10-CM | POA: Diagnosis not present

## 2018-01-11 DIAGNOSIS — I130 Hypertensive heart and chronic kidney disease with heart failure and stage 1 through stage 4 chronic kidney disease, or unspecified chronic kidney disease: Secondary | ICD-10-CM | POA: Diagnosis not present

## 2018-01-11 DIAGNOSIS — N183 Chronic kidney disease, stage 3 (moderate): Secondary | ICD-10-CM | POA: Diagnosis not present

## 2018-01-12 DIAGNOSIS — T8149XD Infection following a procedure, other surgical site, subsequent encounter: Secondary | ICD-10-CM | POA: Diagnosis not present

## 2018-01-12 DIAGNOSIS — E1152 Type 2 diabetes mellitus with diabetic peripheral angiopathy with gangrene: Secondary | ICD-10-CM | POA: Diagnosis not present

## 2018-01-12 DIAGNOSIS — N183 Chronic kidney disease, stage 3 (moderate): Secondary | ICD-10-CM | POA: Diagnosis not present

## 2018-01-12 DIAGNOSIS — E1122 Type 2 diabetes mellitus with diabetic chronic kidney disease: Secondary | ICD-10-CM | POA: Diagnosis not present

## 2018-01-12 DIAGNOSIS — I130 Hypertensive heart and chronic kidney disease with heart failure and stage 1 through stage 4 chronic kidney disease, or unspecified chronic kidney disease: Secondary | ICD-10-CM | POA: Diagnosis not present

## 2018-01-12 DIAGNOSIS — I5032 Chronic diastolic (congestive) heart failure: Secondary | ICD-10-CM | POA: Diagnosis not present

## 2018-01-14 DIAGNOSIS — E1152 Type 2 diabetes mellitus with diabetic peripheral angiopathy with gangrene: Secondary | ICD-10-CM | POA: Diagnosis not present

## 2018-01-14 DIAGNOSIS — I130 Hypertensive heart and chronic kidney disease with heart failure and stage 1 through stage 4 chronic kidney disease, or unspecified chronic kidney disease: Secondary | ICD-10-CM | POA: Diagnosis not present

## 2018-01-14 DIAGNOSIS — I5032 Chronic diastolic (congestive) heart failure: Secondary | ICD-10-CM | POA: Diagnosis not present

## 2018-01-14 DIAGNOSIS — N183 Chronic kidney disease, stage 3 (moderate): Secondary | ICD-10-CM | POA: Diagnosis not present

## 2018-01-14 DIAGNOSIS — E1122 Type 2 diabetes mellitus with diabetic chronic kidney disease: Secondary | ICD-10-CM | POA: Diagnosis not present

## 2018-01-14 DIAGNOSIS — T8149XD Infection following a procedure, other surgical site, subsequent encounter: Secondary | ICD-10-CM | POA: Diagnosis not present

## 2018-01-18 DIAGNOSIS — N183 Chronic kidney disease, stage 3 (moderate): Secondary | ICD-10-CM | POA: Diagnosis not present

## 2018-01-18 DIAGNOSIS — I5032 Chronic diastolic (congestive) heart failure: Secondary | ICD-10-CM | POA: Diagnosis not present

## 2018-01-18 DIAGNOSIS — I130 Hypertensive heart and chronic kidney disease with heart failure and stage 1 through stage 4 chronic kidney disease, or unspecified chronic kidney disease: Secondary | ICD-10-CM | POA: Diagnosis not present

## 2018-01-18 DIAGNOSIS — T8149XD Infection following a procedure, other surgical site, subsequent encounter: Secondary | ICD-10-CM | POA: Diagnosis not present

## 2018-01-18 DIAGNOSIS — E1122 Type 2 diabetes mellitus with diabetic chronic kidney disease: Secondary | ICD-10-CM | POA: Diagnosis not present

## 2018-01-18 DIAGNOSIS — E1152 Type 2 diabetes mellitus with diabetic peripheral angiopathy with gangrene: Secondary | ICD-10-CM | POA: Diagnosis not present

## 2018-01-18 NOTE — Progress Notes (Signed)
NEUROPSYCHOLOGICAL EVALUATION   Name:    [name]  Date of Birth:   27-Jun-1940 Date of Interview:  01/10/2018 Date of Testing:  01/10/2018   Date of Feedback:  01/20/2018       Background Information:  Reason for Referral:  [name] is a 77 y.o. male referred by Dr. Narda Amber to assess his current level of cognitive functioning and assist in differential diagnosis. The current evaluation consisted of a review of available medical records, an interview with the patient and his wife, and the completion of a neuropsychological testing battery. Informed consent was obtained.  History of Presenting Problem:  [name] has a history of CAD, PAD, bilateral carotid disease s/p CEA, hypertension, hyperlipidemia, hypothyroidism, and diabetes. He has had problems with balance since around 2015 and has had multiple falls since then. He was seen by Dr. Posey Pronto initially in March 2019 for neuropathy and memory changes. Bladder and bowel incontinence was also noted. NCS/EMG of the legs suggested chronic and severe sensorimotor axonal polyneuropathy worse on the left. Subsequently he was found to have critical limb ischemia of the left leg and underwent left common femoral endarterectomy, left femoral to below-knee popliteal bypass in June 2019. This was complicated by chronic left groin infection. He was discharged from rehabilitation facility August 8. He followed up with Dr. Posey Pronto on 01/07/2018 and his wife reported cognitive changes possibly suggestive of delirium. Dr. Posey Pronto reassured them that hopefully once he is off antibiotics and infections has cleared cognitive symptoms should improve. MoCA on 01/07/2018 was 26/30 but Dr. Posey Pronto noted that screening measures can underestimate cognitive changes in individuals with high intellectual ability and therefore he was referred for formal neurocognitive evaluation.  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 He feels memory abilities have stayed relatively stable since that time. His wife reports she also noticed memory and word-finding difficulties after the procedure in 2013 but she feels they have gotten worse. She noted that just last week he did not recognize a physical therapist he had seen two days prior. She feels he is having trouble processing information. He may not comprehend what she is telling him the first time through. He forgets recent conversations or things he has been told, but cueing usually helps. He is not repeating himself. He is having word finding difficulty.   He has had acute episodes of confusion and disorientation while in the hospital and rehabilitation facility. He would have moments of not knowing where he was. Also, at home, he will wake up from a nap and be briefly disoriented. He may ask his wife which way to go to get to the bathroom. However his mental status clears quickly. He has not had any hallucinations during these episodes. He has had hallucinations in the past after other surgeries when he was given morphine.   The patient lives at home with his wife. He has home health nursing and PT. He had a bad fall in March 2019 and has not driven since then. He wants to return to driving. Up until recently he was managing his medications; his wife is doing this now. His wife has been doing the finances for a long time. She also prepares meals and keeps track of his appointments.  His father had dementia in his old age, prior to dying at age 78. Otherwise there is no known family history  of dementia or Alzheimer's disease.  The patient continues to have falls. He fell on June 10 while in the hospital and on August 6 in the rehabilitation facility. He fell at home in the middle of the night two nights ago and hit his arm. He did not hit his head. He denies any history of LOC in the context of any of  his falls.  He denies depressed mood. He expresses irritability with PT and home health providers as he does not feel they are helping enough. He denies prior psychiatric history. He has never been treated for a mental health condition. He denies suicidal ideation or intention. His appetite is good. He dozes off frequently during the day. His daughter has been concerned about sleep apnea but he does not wish to have a sleep study performed. He has shortness of breath especially when he is not laying upright. He does not sleep well at night. He has felt very tired lately.    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 He loved his career and "misses it every day". He retired in 2013 after coronary bypass surgery. Marital history: He has been married twice. He and his wife have been married for 40 years. He has one son and one daughter and three step sons.  Alcohol: He typically has a martini some evenings (not daily). Recently he has had much less since he has been in the hospital and nursing home.  Tobacco: Former smoker   Medical History:  Past Medical History:  Diagnosis Date  . Arthritis   . Atherosclerotic heart disease   . Back pain   . Bruises easily    takes Pletal daily and ASA 325mg  daily  . Cataract immature    right  . Chest pain   . Colon polyps   . Complication of anesthesia    hard to wake up with bypass surgery  . Cyst    on perineum;takes Doxycycline daily  . Diabetes mellitus    takes Glipizide,Metformin,and Actos daily  . Diarrhea   . Dyspnea    when lying flat  . Edema    right leg knee down swollen since fall 3 wks ago  . Foot drop, left   . GERD (gastroesophageal reflux disease)    Rolaids as needed-occ reflux  . Hemorrhoids   . History of kidney stones    at age 51   . Hyperlipidemia    takes Tricor and Lipitor daily    . Hypertension    takes Altace,Amlodipine,and Nadolol daily  . Hypothyroidism   . Kidney stone    35+yrs ago  . Muscle pain   . Nasal polyps    hx of  . Peripheral edema    wears knee length hose-told by podiatrist to wear these  . Peripheral neuropathy    takes Gabapentin daily  . Pneumonia    as a child  . PVD (peripheral vascular disease) ([place])   . Renal insufficiency   . Seasonal allergies    takes Mucinex and Zyrtec prn  . Urinary frequency    urgency/takes Vesicare daily    Current medications:  Outpatient Encounter Medications as of 01/20/2018  Medication Sig  . aspirin 325 MG EC tablet Take 325 mg by mouth every evening.   Marland Kitchen atorvastatin (LIPITOR) 20 MG tablet Take 20 mg by mouth every evening.   . cephALEXin (KEFLEX) 500 MG capsule Take 1 capsule (500  mg total) by mouth every 12 (twelve) hours.  . cetirizine (ZYRTEC ALLERGY) 10 MG tablet Take 1 tablet (10 mg total) by mouth daily as needed for allergies.  . Cyanocobalamin (VITAMIN B 12 PO) Take 500 mcg by mouth daily.   Marland Kitchen erythromycin ophthalmic ointment USE A SMALL AMOUNT IN THE LEFT EYE DAILY AS NEEDED FOR IRRITATION  . fenofibrate (TRICOR) 145 MG tablet Take 145 mg by mouth every morning.  . ferrous sulfate 325 (65 FE) MG tablet Take 1 tablet (325 mg total) by mouth 3 (three) times daily with meals.  . furosemide (LASIX) 40 MG tablet Take 1 tablet (40 mg total) by mouth daily.  Marland Kitchen gabapentin (NEURONTIN) 300 MG capsule Take 1 capsule (300 mg total) by mouth at bedtime. Take 600 mg every morning and 300 mg every night  . HYDROcodone-acetaminophen (NORCO/VICODIN) 5-325 MG tablet Take 1 tablet by mouth every 6 (six) hours as needed for severe pain.  Marland Kitchen levothyroxine (SYNTHROID, LEVOTHROID) 50 MCG tablet Take 50 mcg by mouth daily before breakfast.  . loperamide (IMODIUM A-D) 2 MG tablet Take 2 mg by mouth daily.  . methylcellulose (ARTIFICIAL TEARS) 1 % ophthalmic solution Place 1 drop into both eyes 2 (two) times daily  as needed (dry eyes).  . metoprolol tartrate (LOPRESSOR) 50 MG tablet Take 1 tablet (50 mg total) by mouth 2 (two) times daily.  . Multiple Vitamin (MULTIVITAMIN WITH MINERALS) TABS tablet Take 1 tablet by mouth daily.  . nitroGLYCERIN (NITROSTAT) 0.4 MG SL tablet PLACE 1 TABLET UNDER TONGUE EVERY 5 MINUTES AS NEEDED FOR CHEST PAIN.  Marland Kitchen oxymetazoline (VICKS SINEX) 0.05 % nasal spray Place 1 spray into both nostrils 2 (two) times daily as needed for congestion.  . pioglitazone (ACTOS) 30 MG tablet Take 1 tablet (30 mg total) by mouth daily with lunch.  . potassium chloride SA (K-DUR,KLOR-CON) 20 MEQ tablet Take 20 mEq by mouth daily as needed (with Lasix).   . pseudoephedrine-guaifenesin (MUCINEX D) 60-600 MG per tablet Take 1 tablet by mouth every 12 (twelve) hours as needed for congestion.   . senna-docusate (SENOKOT-S) 8.6-50 MG tablet Take 1 tablet by mouth at bedtime as needed for mild constipation.  . sodium chloride (OCEAN) 0.65 % SOLN nasal spray Place 1 spray into both nostrils 2 (two) times daily as needed for congestion.  . tamsulosin (FLOMAX) 0.4 MG CAPS capsule Take 1 capsule (0.4 mg total) by mouth daily.  . temazepam (RESTORIL) 15 MG capsule Take 15 mg by mouth at bedtime as needed for sleep.   Marland Kitchen UNABLE TO FIND Apply 1 application topically every 6 (six) hours as needed (pain). Med Name: *Ibuprofen 15% Baclofen 1% Lidocaine 10% Gabapentin 3% Topical Cream** To wrist  . zinc gluconate 50 MG tablet Take 50 mg by mouth daily.   No facility-administered encounter medications on file as of 01/20/2018.      Current Examination:  Behavioral Observations:  Appearance: Casually dressed and appropriately groomed Gait: Seated in a wheelchair. (Required 3 people to help him transfer from wheelchair to office chair for the testing appointment.) Speech: Fluent; normal rate, rhythm and volume. No significant word finding difficulty observed during conversational speech. Thought process: Linear,  goal directed Affect: Blunted. Initially somewhat irritable but softened throughout the interview. Interpersonal: Pleasant, appropriate Orientation: Oriented to person, place and most aspects of time (did not know the current date). Accurately named the current President and his predecessor.   Tests Administered: . Test of Premorbid Functioning (TOPF) . Wechsler Adult Intelligence  Scale-Fourth Edition (WAIS-IV): Similarities, Block Design, Coding and Digit Span subtests . Wechsler Memory Scale-Fourth Edition (WMS-IV) Older Adult Version (ages 17-90): Logical Memory I, II and Recognition subtests  . [employer] Verbal Learning Test - 2nd Edition (CVLT-2) Short Form . Repeatable Battery for the Assessment of Neuropsychological Status (RBANS) Form A:  Figure Copy and Recall subtests and Semantic Fluency subtest . Boston Naming Test (BNT) . Boston Diagnostic Aphasia Examination: Complex Ideational Material subtest . Controlled Oral Word Association Test (COWAT) . Trail Making Test A and B . Clock drawing test . Beck Depression Inventory - 2nd Edition (BDI-II) . Generalized Anxiety Disorder - 7 item screener (GAD-7)  Test Results: Note: Standardized scores are presented only for use by appropriately trained professionals and to allow for any future test-retest comparison. These scores should not be interpreted without consideration of all the information that is contained in the rest of the report. The most recent standardization samples from the test publisher or other sources were used whenever possible to derive standard scores; scores were corrected for age, gender, ethnicity and education when available.   Test Scores:  Test Name Raw Score Standardized Score Descriptor  TOPF 67/70 SS= 129 Superior  WAIS-IV Subtests     Similarities 30/36 ss= 14 Superior  Block Design 36/66 ss= 12 High average  Coding 39/135 ss= 9 Average  Digit Span Forward 11/16 ss= 12 High average  Digit Span Backward  6/16 ss= 8 Low end of average  WMS-IV Subtests     LM I 24/53 ss= 8 Low end of average  LM II 16/39 ss= 10 Average  LM II Recognition 16/23 Cum %: 26-50 WNL  RBANS Subtests     Figure Copy 20/20 Z= 1.2 High average  Figure Recall 18/20 Z= 1.3 High average  Semantic Fluency 17 Z= -0.5 Average  CVLT-II Scores     Trial 1 5/9 Z= 0 Average  Trial 4 6/9 Z= -1 Low average  Trials 1-4 total 21/36 T= 45 Average  SD Free Recall 5/9 Z= -1 Low average  LD Free Recall 3/9 Z= -1 Low average  LD Cued Recall 5/9 Z= -0.5 Average  Recognition Discriminability 9/9 hits 0 false positives Z= 1 High average  Forced Choice Recognition 9/9  WNL  BNT 56/60 T= 57 High average  BDAE Complex Ideational Material 10/12    COWAT-FAS 28 T= 38 Low average  COWAT-Animals 19 T= 52 Average  Trail Making Test A  68" 0 errors T= 38 Low average  Trail Making Test B  129" 2 errors T= 48 Average  Clock Drawing   WNL  BDI-II 10/63  WNL  GAD-7 3/21  WNL      Description of Test Results:  Premorbid verbal intellectual abilities were estimated to have been within the superior range based on a test of word reading. Psychomotor processing speed was average. Basic auditory attention was high average, while more complex auditory attention (ie working memory) was low end of average. Visual-spatial construction was high average. Language abilities were within normal limits. Specifically, confrontation naming was high average, and semantic verbal fluency was average. Auditory comprehension of complex ideational material was within normal limits. With regard to verbal memory, encoding and acquisition of non-contextual information (i.e., word list) was average. After a brief distracter task, free recall was low average (5/9 items). After a delay, free recall was low average (3/9 items). Cued recall was average (5/9 items). Performance on a yes/no recognition task was high average with 100% accuracy. On  another verbal memory test,  encoding and acquisition of contextual auditory information (i.e., short stories) was low end of average. After a delay, free recall was average. Performance on a yes/no recognition task was within normal limits. With regard to non-verbal memory, delayed free recall of visual information was high average. Executive functioning was within normal limits overall. Mental flexibility and set-shifting were average on Trails B; he committed two set loss errors but quickly recovered from them. Verbal fluency with phonemic search restrictions was low average. Verbal abstract reasoning was superior. Performance on a clock drawing task was normal. On a self-report measure of mood, the patient's responses were not indicative of clinically significant depression at the present time. He also denied suicidal ideation or intention. On a self-report measure of anxiety, the patient did not endorse clinically significant generalized anxiety at the present time.    Clinical Impressions: Mild cognitive impairment (likely vascular). The patient presents with cognitive test performances within normal limits for age, but some areas of relative weakness suggest decline from baseline. Specifically, working memory, phonemic fluency, processing speed, are in the low average range. This cognitive profile is indicative of frontal-subcortical involvement. His cognitive profile, in combination with history of bypass surgery and multiple vascular risk factors, as well as MRI showing chronic microvascular ischemia, is indicative of mild vascular cognitive impairment. More recently, in the context of acute infection and hospitalization, he appears to have experienced delirium, which has largely resolved.  I do not see any evidence of neurodegenerative disorder or Alzheimer's disease at this time. He also is not reporting significant depression or anxiety.    Recommendations/Plan: Based on the findings of the present evaluation, the following  recommendations are offered:  1. The patient wishes to return to driving. I do not see evidence of cognitive impairment at this point that would impede his driving ability. However, there are clear physical limitations that would likely impact his ability to drive; this would be addressed by his medical providers.  2. The patient's daughter described concerns about sleep apnea. The patient has never been tested for this but does seem possible. If he has untreated sleep apnea that could certainly be contributing to white matter disease and cognitive impairment. He will consider whether or not he wants a sleep study and discuss further with Dr. Posey Pronto.  3. The patient would like to know if any of the antibiotics he is on could be contributing to cognitive symptoms or confusion. I stated I am not aware that they would but I would defer this question to Dr. Posey Pronto who has more expertise with medication effects. They would like her input on this.  4. If there is any change in cognitive functioning reported or observed in the future, he can certainly be referred for re-evaluation. The current test results can be utilized in test-retest comparison.   Feedback to Patient: [name], his wife and his daughter Maretta Bees) completed a feedback appointment on 01/20/2018 to review the results of his neuropsychological evaluation with this provider. 35 minutes interactive time was spent reviewing his test results, my impressions and my recommendations as detailed above. His daughter had many questions which I answered to the best of my ability.    Total time spent on this patient's case: 80 minutes for neurobehavioral status exam with psychologist (CPT code [phone]); 90 minutes of testing/scoring by psychometrician under psychologist's supervision (CPT codes [phone], [phone] units); 180 minutes for integration of patient data, interpretation of standardized test results and clinical data, clinical  decision making,  treatment planning and preparation of this report, and interactive feedback with review of results to the patient/family by psychologist (CPT codes [phone], [phone] units).     Thank you for your referral of [name]. Please feel free to contact me if you have any questions or concerns regarding this report.

## 2018-01-19 DIAGNOSIS — T8149XD Infection following a procedure, other surgical site, subsequent encounter: Secondary | ICD-10-CM | POA: Diagnosis not present

## 2018-01-19 DIAGNOSIS — N183 Chronic kidney disease, stage 3 (moderate): Secondary | ICD-10-CM | POA: Diagnosis not present

## 2018-01-19 DIAGNOSIS — E1122 Type 2 diabetes mellitus with diabetic chronic kidney disease: Secondary | ICD-10-CM | POA: Diagnosis not present

## 2018-01-19 DIAGNOSIS — I130 Hypertensive heart and chronic kidney disease with heart failure and stage 1 through stage 4 chronic kidney disease, or unspecified chronic kidney disease: Secondary | ICD-10-CM | POA: Diagnosis not present

## 2018-01-19 DIAGNOSIS — E1152 Type 2 diabetes mellitus with diabetic peripheral angiopathy with gangrene: Secondary | ICD-10-CM | POA: Diagnosis not present

## 2018-01-19 DIAGNOSIS — I5032 Chronic diastolic (congestive) heart failure: Secondary | ICD-10-CM | POA: Diagnosis not present

## 2018-01-20 ENCOUNTER — Telehealth: Payer: Self-pay | Admitting: Psychology

## 2018-01-20 ENCOUNTER — Encounter: Payer: Self-pay | Admitting: Psychology

## 2018-01-20 ENCOUNTER — Ambulatory Visit (INDEPENDENT_AMBULATORY_CARE_PROVIDER_SITE_OTHER): Payer: Medicare HMO | Admitting: Psychology

## 2018-01-20 DIAGNOSIS — R413 Other amnesia: Secondary | ICD-10-CM

## 2018-01-20 DIAGNOSIS — G3184 Mild cognitive impairment, so stated: Secondary | ICD-10-CM

## 2018-01-20 NOTE — Telephone Encounter (Signed)
[name], I learned how to do a conference call on our phones. I will call both the patient and his daughter via conference call to go over the results. Thanks for your help! mb

## 2018-01-20 NOTE — Telephone Encounter (Signed)
I spoke with the patient's daughter and she would like you to please call her after speaking with her parents. She is unable to do a conference call. Thanks

## 2018-01-20 NOTE — Telephone Encounter (Signed)
Great!

## 2018-01-21 DIAGNOSIS — N183 Chronic kidney disease, stage 3 (moderate): Secondary | ICD-10-CM | POA: Diagnosis not present

## 2018-01-21 DIAGNOSIS — T8149XD Infection following a procedure, other surgical site, subsequent encounter: Secondary | ICD-10-CM | POA: Diagnosis not present

## 2018-01-21 DIAGNOSIS — I130 Hypertensive heart and chronic kidney disease with heart failure and stage 1 through stage 4 chronic kidney disease, or unspecified chronic kidney disease: Secondary | ICD-10-CM | POA: Diagnosis not present

## 2018-01-21 DIAGNOSIS — E1122 Type 2 diabetes mellitus with diabetic chronic kidney disease: Secondary | ICD-10-CM | POA: Diagnosis not present

## 2018-01-21 DIAGNOSIS — I5032 Chronic diastolic (congestive) heart failure: Secondary | ICD-10-CM | POA: Diagnosis not present

## 2018-01-21 DIAGNOSIS — I5030 Unspecified diastolic (congestive) heart failure: Secondary | ICD-10-CM | POA: Diagnosis not present

## 2018-01-21 DIAGNOSIS — E1152 Type 2 diabetes mellitus with diabetic peripheral angiopathy with gangrene: Secondary | ICD-10-CM | POA: Diagnosis not present

## 2018-01-21 DIAGNOSIS — E039 Hypothyroidism, unspecified: Secondary | ICD-10-CM | POA: Diagnosis not present

## 2018-01-25 DIAGNOSIS — I5032 Chronic diastolic (congestive) heart failure: Secondary | ICD-10-CM | POA: Diagnosis not present

## 2018-01-25 DIAGNOSIS — T8149XD Infection following a procedure, other surgical site, subsequent encounter: Secondary | ICD-10-CM | POA: Diagnosis not present

## 2018-01-25 DIAGNOSIS — N183 Chronic kidney disease, stage 3 (moderate): Secondary | ICD-10-CM | POA: Diagnosis not present

## 2018-01-25 DIAGNOSIS — E1152 Type 2 diabetes mellitus with diabetic peripheral angiopathy with gangrene: Secondary | ICD-10-CM | POA: Diagnosis not present

## 2018-01-25 DIAGNOSIS — I130 Hypertensive heart and chronic kidney disease with heart failure and stage 1 through stage 4 chronic kidney disease, or unspecified chronic kidney disease: Secondary | ICD-10-CM | POA: Diagnosis not present

## 2018-01-25 DIAGNOSIS — E1122 Type 2 diabetes mellitus with diabetic chronic kidney disease: Secondary | ICD-10-CM | POA: Diagnosis not present

## 2018-01-26 DIAGNOSIS — R3914 Feeling of incomplete bladder emptying: Secondary | ICD-10-CM | POA: Diagnosis not present

## 2018-01-26 DIAGNOSIS — R350 Frequency of micturition: Secondary | ICD-10-CM | POA: Diagnosis not present

## 2018-01-26 DIAGNOSIS — N139 Obstructive and reflux uropathy, unspecified: Secondary | ICD-10-CM | POA: Diagnosis not present

## 2018-01-26 DIAGNOSIS — R351 Nocturia: Secondary | ICD-10-CM | POA: Diagnosis not present

## 2018-01-27 ENCOUNTER — Encounter: Payer: Self-pay | Admitting: Vascular Surgery

## 2018-01-27 ENCOUNTER — Ambulatory Visit (INDEPENDENT_AMBULATORY_CARE_PROVIDER_SITE_OTHER): Payer: Medicare HMO | Admitting: Vascular Surgery

## 2018-01-27 ENCOUNTER — Other Ambulatory Visit: Payer: Self-pay

## 2018-01-27 VITALS — BP 151/60 | HR 61 | Temp 97.0°F | Resp 18 | Ht 71.0 in | Wt 264.0 lb

## 2018-01-27 DIAGNOSIS — E1122 Type 2 diabetes mellitus with diabetic chronic kidney disease: Secondary | ICD-10-CM | POA: Diagnosis not present

## 2018-01-27 DIAGNOSIS — N183 Chronic kidney disease, stage 3 (moderate): Secondary | ICD-10-CM | POA: Diagnosis not present

## 2018-01-27 DIAGNOSIS — E1152 Type 2 diabetes mellitus with diabetic peripheral angiopathy with gangrene: Secondary | ICD-10-CM | POA: Diagnosis not present

## 2018-01-27 DIAGNOSIS — I130 Hypertensive heart and chronic kidney disease with heart failure and stage 1 through stage 4 chronic kidney disease, or unspecified chronic kidney disease: Secondary | ICD-10-CM | POA: Diagnosis not present

## 2018-01-27 DIAGNOSIS — I5032 Chronic diastolic (congestive) heart failure: Secondary | ICD-10-CM | POA: Diagnosis not present

## 2018-01-27 DIAGNOSIS — T8149XD Infection following a procedure, other surgical site, subsequent encounter: Secondary | ICD-10-CM | POA: Diagnosis not present

## 2018-01-27 DIAGNOSIS — I739 Peripheral vascular disease, unspecified: Secondary | ICD-10-CM

## 2018-01-27 MED ORDER — CEPHALEXIN 500 MG PO CAPS: 500.0000 mg | ORAL_CAPSULE | Freq: Two times a day (BID) | ORAL | 2 refills | Status: DC

## 2018-01-27 NOTE — Progress Notes (Signed)
Patient is a 77 year old male who returns for follow-up today.  He previously underwent aortobifemoral bypass in the remote past.  More recently he had a left femoral endarterectomy with left femoral to below-knee popliteal bypass with vein.  This was June 2019. He subsequently had significant wound problems in the left groin requiring VAC therapy and multiple debridements.  He returns today for further follow-up.  He still complains of bilateral lower extremity swelling.  This has been chronic.  The wound in the left groin is draining minimally.  The wound on his left foot has almost healed.  He is still on Keflex antibiotics.  He is now living at home and out of the nursing home.  He is ambulating some with a walker.  He has also had previous right greater saphenous harvesting for coronary bypass.  He has also had bilateral carotid endarterectomies in the remote past.  Past Surgical History:  Procedure Laterality Date  . Aortobifemoral bypass    . APPLICATION OF WOUND VAC Left 10/25/2017   Procedure: Wound vac placement to left groin.;  Surgeon: Elam Dutch, MD;  Location: [facility] OR;  Service: Vascular;  Laterality: Left;  . APPLICATION OF WOUND VAC Left 11/10/2017   Procedure: APPLICATION OF WOUND VAC;  Surgeon: Elam Dutch, MD;  Location: [place];  Service: Vascular;  Laterality: Left;  . CARDIAC CATHETERIZATION  most recent 05/2011   total of 4  . CAROTID ANGIOGRAM N/A 06/05/2011   Procedure: CAROTID ANGIOGRAM;  Surgeon: Elam Dutch, MD;  Location: [facility] CATH LAB;  Service: Cardiovascular;  Laterality: N/A;  . CAROTID ENDARTERECTOMY  04/08/2011   left CEA  . cataract surgery Bilateral    left  . COLONOSCOPY    . COLONOSCOPY WITH PROPOFOL N/A 08/22/2013   Procedure: COLONOSCOPY WITH PROPOFOL;  Surgeon: Garlan Fair, MD;  Location: [facility] ENDOSCOPY;  Service: Endoscopy;  Laterality: N/A;  . CORONARY ARTERY BYPASS GRAFT  09/09/2011   Procedure: CORONARY ARTERY BYPASS GRAFTING (CABG);   Surgeon: Gaye Pollack, MD;  Location: [place];  Service: Open Heart Surgery;  Laterality: N/A;  Coronary artery bypass grafting  x three with Right saphenous vein harvested endoscopically and left internal mammary artery  . ENDARTERECTOMY  04/08/2011   Procedure: ENDARTERECTOMY CAROTID;  Surgeon: Elam Dutch, MD;  Location: [facility] OR;  Service: Vascular;  Laterality: Left;  with patch angioplasty  . ENDARTERECTOMY  09/09/2011   Procedure: ENDARTERECTOMY CAROTID;  Surgeon: Elam Dutch, MD;  Location: [facility] OR;  Service: Vascular;  Laterality: Right;  with patch angioplasty  . FEMORAL-POPLITEAL BYPASS GRAFT Left 10/25/2017   Procedure: Left Common Femoral Endarterectomy and perfundaplasty, Left BYPASS GRAFT FEMORAL-BELOW KNEE POPLITEAL ARTERY.;  Surgeon: Elam Dutch, MD;  Location: [place];  Service: Vascular;  Laterality: Left;  . GROIN DEBRIDEMENT Left 11/10/2017   Procedure: GROIN DEBRIDEMENT;  Surgeon: Elam Dutch, MD;  Location: [place];  Service: Vascular;  Laterality: Left;  . LEFT HEART CATHETERIZATION WITH CORONARY ANGIOGRAM N/A 07/09/2011   Procedure: LEFT HEART CATHETERIZATION WITH CORONARY ANGIOGRAM;  Surgeon: Sinclair Grooms, MD;  Location: [facility] CATH LAB;  Service: Cardiovascular;  Laterality: N/A;  . LOWER EXTREMITY ANGIOGRAPHY N/A 10/11/2017   Procedure: LOWER EXTREMITY ANGIOGRAPHY;  Surgeon: Waynetta Sandy, MD;  Location: [place] CV LAB;  Service: Cardiovascular;  Laterality: N/A;  . Reimplantation of inferior mesenteric artery    . Repair of infrarenal abdominal aortic aneurysm    . SHOULDER ARTHROSCOPY W/ ROTATOR CUFF REPAIR  right and left-open procedures  . TONSILLECTOMY     at age 81  . TRIGGER FINGER RELEASE Right 06/28/2014   Procedure: RIGHT LONG TRIGGER RELEASE ;  Surgeon: Leanora Cover, MD;  Location: [place];  Service: Orthopedics;  Laterality: Right;  . wisdom teeth extracted     as a teenager  . WOUND DEBRIDEMENT Left 11/05/2017    Procedure: DEBRIDEMENT WOUND LEFT GROIN with wound vac placement;  Surgeon: Elam Dutch, MD;  Location: [facility] OR;  Service: Vascular;  Laterality: Left;   Physical exam:  Vitals:   01/27/18 1604 01/27/18 1610  BP: (!) 147/61 (!) 151/60  Pulse: 62 61  Resp: 18   Temp: (!) 97 F (36.1 C)   TempSrc: Oral   SpO2: 92%   Weight: 264 lb (119.7 kg)   Height: 5\' 11"  (1.803 m)     Left lower extremity healing groin incision wound is now 2 cm x 2 cm less than 1 cm depth, 2+ left dorsalis pedis pulse, dry eschar tip of the left first toe no wounds right foot 2+ edema bilateral lower extremities symmetric  Assessment: Slowly healing left groin incision currently no evidence of graft infection left first toe almost healed patent bypass graft left leg  Plan: Patient will continue local wound care with normal saline new gauze dressings to the left groin.  We will continue his Keflex 500 mg twice daily until the groin is completely healed.  The patient will follow-up with me in 1 month with bilateral ABIs and a graft duplex scan at that office visit.  Hopefully his wound will be completely healed by that point.  Ruta Hinds, MD [facility] Office: [phone] Pager: [phone]

## 2018-01-28 DIAGNOSIS — E1152 Type 2 diabetes mellitus with diabetic peripheral angiopathy with gangrene: Secondary | ICD-10-CM | POA: Diagnosis not present

## 2018-01-28 DIAGNOSIS — E1122 Type 2 diabetes mellitus with diabetic chronic kidney disease: Secondary | ICD-10-CM | POA: Diagnosis not present

## 2018-01-28 DIAGNOSIS — T8149XD Infection following a procedure, other surgical site, subsequent encounter: Secondary | ICD-10-CM | POA: Diagnosis not present

## 2018-01-28 DIAGNOSIS — I5032 Chronic diastolic (congestive) heart failure: Secondary | ICD-10-CM | POA: Diagnosis not present

## 2018-01-28 DIAGNOSIS — N183 Chronic kidney disease, stage 3 (moderate): Secondary | ICD-10-CM | POA: Diagnosis not present

## 2018-01-28 DIAGNOSIS — I130 Hypertensive heart and chronic kidney disease with heart failure and stage 1 through stage 4 chronic kidney disease, or unspecified chronic kidney disease: Secondary | ICD-10-CM | POA: Diagnosis not present

## 2018-01-31 DIAGNOSIS — E1152 Type 2 diabetes mellitus with diabetic peripheral angiopathy with gangrene: Secondary | ICD-10-CM | POA: Diagnosis not present

## 2018-01-31 DIAGNOSIS — N183 Chronic kidney disease, stage 3 (moderate): Secondary | ICD-10-CM | POA: Diagnosis not present

## 2018-01-31 DIAGNOSIS — I5032 Chronic diastolic (congestive) heart failure: Secondary | ICD-10-CM | POA: Diagnosis not present

## 2018-01-31 DIAGNOSIS — E1122 Type 2 diabetes mellitus with diabetic chronic kidney disease: Secondary | ICD-10-CM | POA: Diagnosis not present

## 2018-01-31 DIAGNOSIS — I130 Hypertensive heart and chronic kidney disease with heart failure and stage 1 through stage 4 chronic kidney disease, or unspecified chronic kidney disease: Secondary | ICD-10-CM | POA: Diagnosis not present

## 2018-01-31 DIAGNOSIS — T8149XD Infection following a procedure, other surgical site, subsequent encounter: Secondary | ICD-10-CM | POA: Diagnosis not present

## 2018-02-01 DIAGNOSIS — E1152 Type 2 diabetes mellitus with diabetic peripheral angiopathy with gangrene: Secondary | ICD-10-CM | POA: Diagnosis not present

## 2018-02-01 DIAGNOSIS — T8149XD Infection following a procedure, other surgical site, subsequent encounter: Secondary | ICD-10-CM | POA: Diagnosis not present

## 2018-02-01 DIAGNOSIS — E1122 Type 2 diabetes mellitus with diabetic chronic kidney disease: Secondary | ICD-10-CM | POA: Diagnosis not present

## 2018-02-01 DIAGNOSIS — I130 Hypertensive heart and chronic kidney disease with heart failure and stage 1 through stage 4 chronic kidney disease, or unspecified chronic kidney disease: Secondary | ICD-10-CM | POA: Diagnosis not present

## 2018-02-01 DIAGNOSIS — N183 Chronic kidney disease, stage 3 (moderate): Secondary | ICD-10-CM | POA: Diagnosis not present

## 2018-02-01 DIAGNOSIS — I5032 Chronic diastolic (congestive) heart failure: Secondary | ICD-10-CM | POA: Diagnosis not present

## 2018-02-02 NOTE — Progress Notes (Signed)
Cardiology Office Note:    Date:  02/03/2018   ID:  KROSBY RITCHIE, DOB [birth year], MRN [id number]  PCP:  Lavone Orn, MD  Cardiologist:  Sinclair Grooms, MD   Referring MD: Lavone Orn, MD   Chief Complaint  Patient presents with  . Congestive Heart Failure  . Shortness of Breath  . Leg Swelling    History of Present Illness:    [name] is a 77 y.o. male with a hx of CAD, chronic combined systolic and diastolic heart failure, bilateral carotid artery disease status post endarterectomy, peripheral arterial disease with claudication, hypertension, chronic kidney disease, and hyperlipidemia. Presents with refractory lower extremity swelling.   He is referred by Dr. Lavone Orn because of recalcitrant bilateral lower extremity edema.  He also has orthopnea since the recent left lower extremity operation.  He is sitting upright in his chair at home.  He has recently diagnosed hypothyroidism and is having therapy uptitrated.  He has been sedentary.  Bilateral lower extremity swelling is noted.  Right lower extremity swelling has been present since bypass surgery 2013.  He denies angina.  Has not had syncope.  No palpitations.  Decreased memory.  Past Medical History:  Diagnosis Date  . Arthritis   . Atherosclerotic heart disease   . Back pain   . Bruises easily    takes Pletal daily and ASA 325mg  daily  . Cataract immature    right  . Chest pain   . Colon polyps   . Complication of anesthesia    hard to wake up with bypass surgery  . Cyst    on perineum;takes Doxycycline daily  . Diabetes mellitus    takes Glipizide,Metformin,and Actos daily  . Diarrhea   . Dyspnea    when lying flat  . Edema    right leg knee down swollen since fall 3 wks ago  . Foot drop, left   . GERD (gastroesophageal reflux disease)    Rolaids as needed-occ reflux  . Hemorrhoids   . History of kidney stones    at age 71   . Hyperlipidemia    takes Tricor and Lipitor daily  .  Hypertension    takes Altace,Amlodipine,and Nadolol daily  . Hypothyroidism   . Kidney stone    35+yrs ago  . Muscle pain   . Nasal polyps    hx of  . Peripheral edema    wears knee length hose-told by podiatrist to wear these  . Peripheral neuropathy    takes Gabapentin daily  . Pneumonia    as a child  . PVD (peripheral vascular disease) ([place])   . Renal insufficiency   . Seasonal allergies    takes Mucinex and Zyrtec prn  . Urinary frequency    urgency/takes Vesicare daily    Past Surgical History:  Procedure Laterality Date  . Aortobifemoral bypass    . APPLICATION OF WOUND VAC Left 10/25/2017   Procedure: Wound vac placement to left groin.;  Surgeon: Elam Dutch, MD;  Location: [facility] OR;  Service: Vascular;  Laterality: Left;  . APPLICATION OF WOUND VAC Left 11/10/2017   Procedure: APPLICATION OF WOUND VAC;  Surgeon: Elam Dutch, MD;  Location: [place];  Service: Vascular;  Laterality: Left;  . CARDIAC CATHETERIZATION  most recent 05/2011   total of 4  . CAROTID ANGIOGRAM N/A 06/05/2011   Procedure: CAROTID ANGIOGRAM;  Surgeon: Elam Dutch, MD;  Location: [facility] CATH LAB;  Service: Cardiovascular;  Laterality: N/A;  .  CAROTID ENDARTERECTOMY  04/08/2011   left CEA  . cataract surgery Bilateral    left  . COLONOSCOPY    . COLONOSCOPY WITH PROPOFOL N/A 08/22/2013   Procedure: COLONOSCOPY WITH PROPOFOL;  Surgeon: Garlan Fair, MD;  Location: [facility] ENDOSCOPY;  Service: Endoscopy;  Laterality: N/A;  . CORONARY ARTERY BYPASS GRAFT  09/09/2011   Procedure: CORONARY ARTERY BYPASS GRAFTING (CABG);  Surgeon: Gaye Pollack, MD;  Location: [place];  Service: Open Heart Surgery;  Laterality: N/A;  Coronary artery bypass grafting  x three with Right saphenous vein harvested endoscopically and left internal mammary artery  . ENDARTERECTOMY  04/08/2011   Procedure: ENDARTERECTOMY CAROTID;  Surgeon: Elam Dutch, MD;  Location: [facility] OR;  Service: Vascular;  Laterality: Left;  with  patch angioplasty  . ENDARTERECTOMY  09/09/2011   Procedure: ENDARTERECTOMY CAROTID;  Surgeon: Elam Dutch, MD;  Location: [facility] OR;  Service: Vascular;  Laterality: Right;  with patch angioplasty  . FEMORAL-POPLITEAL BYPASS GRAFT Left 10/25/2017   Procedure: Left Common Femoral Endarterectomy and perfundaplasty, Left BYPASS GRAFT FEMORAL-BELOW KNEE POPLITEAL ARTERY.;  Surgeon: Elam Dutch, MD;  Location: [place];  Service: Vascular;  Laterality: Left;  . GROIN DEBRIDEMENT Left 11/10/2017   Procedure: GROIN DEBRIDEMENT;  Surgeon: Elam Dutch, MD;  Location: [place];  Service: Vascular;  Laterality: Left;  . LEFT HEART CATHETERIZATION WITH CORONARY ANGIOGRAM N/A 07/09/2011   Procedure: LEFT HEART CATHETERIZATION WITH CORONARY ANGIOGRAM;  Surgeon: Sinclair Grooms, MD;  Location: [facility] CATH LAB;  Service: Cardiovascular;  Laterality: N/A;  . LOWER EXTREMITY ANGIOGRAPHY N/A 10/11/2017   Procedure: LOWER EXTREMITY ANGIOGRAPHY;  Surgeon: Waynetta Sandy, MD;  Location: [place] CV LAB;  Service: Cardiovascular;  Laterality: N/A;  . Reimplantation of inferior mesenteric artery    . Repair of infrarenal abdominal aortic aneurysm    . SHOULDER ARTHROSCOPY W/ ROTATOR CUFF REPAIR     right and left-open procedures  . TONSILLECTOMY     at age 42  . TRIGGER FINGER RELEASE Right 06/28/2014   Procedure: RIGHT LONG TRIGGER RELEASE ;  Surgeon: Leanora Cover, MD;  Location: [place];  Service: Orthopedics;  Laterality: Right;  . wisdom teeth extracted     as a teenager  . WOUND DEBRIDEMENT Left 11/05/2017   Procedure: DEBRIDEMENT WOUND LEFT GROIN with wound vac placement;  Surgeon: Elam Dutch, MD;  Location: [facility] OR;  Service: Vascular;  Laterality: Left;    Current Medications: Current Meds  Medication Sig  . Ascorbic Acid (VITAMIN C PO) Take 1 tablet by mouth 2 (two) times daily.  Marland Kitchen aspirin 325 MG EC tablet Take 325 mg by mouth every evening.   Marland Kitchen atorvastatin (LIPITOR)  20 MG tablet Take 20 mg by mouth every evening.   . cephALEXin (KEFLEX) 500 MG capsule Take 1 capsule (500 mg total) by mouth every 12 (twelve) hours.  . cetirizine (ZYRTEC ALLERGY) 10 MG tablet Take 1 tablet (10 mg total) by mouth daily as needed for allergies.  . Cyanocobalamin (VITAMIN B 12 PO) Take 500 mcg by mouth daily.   Marland Kitchen erythromycin ophthalmic ointment USE A SMALL AMOUNT IN THE LEFT EYE DAILY AS NEEDED FOR IRRITATION  . fenofibrate (TRICOR) 145 MG tablet Take 145 mg by mouth every morning.  . furosemide (LASIX) 40 MG tablet Take 80 mg by mouth 2 (two) times daily.  Marland Kitchen gabapentin (NEURONTIN) 300 MG capsule Take 300 mg by mouth 2 (two) times daily.  Marland Kitchen HYDROcodone-acetaminophen (NORCO/VICODIN)  5-325 MG tablet Take 1 tablet by mouth every 6 (six) hours as needed for severe pain.  Marland Kitchen levothyroxine (SYNTHROID, LEVOTHROID) 100 MCG tablet Take 100 mcg by mouth daily before breakfast.   . loperamide (IMODIUM A-D) 2 MG tablet Take 2 mg by mouth daily as needed for diarrhea or loose stools.   . methylcellulose (ARTIFICIAL TEARS) 1 % ophthalmic solution Place 1 drop into both eyes 2 (two) times daily as needed (dry eyes).  . metolazone (ZAROXOLYN) 5 MG tablet Take 5 mg by mouth daily as needed (for edema per Dr. Laurann Montana).  . metoprolol tartrate (LOPRESSOR) 50 MG tablet Take 1 tablet (50 mg total) by mouth 2 (two) times daily.  . Multiple Vitamin (MULTIVITAMIN WITH MINERALS) TABS tablet Take 1 tablet by mouth daily.  . nitroGLYCERIN (NITROSTAT) 0.4 MG SL tablet PLACE 1 TABLET UNDER TONGUE EVERY 5 MINUTES AS NEEDED FOR CHEST PAIN.  Marland Kitchen oxymetazoline (VICKS SINEX) 0.05 % nasal spray Place 1 spray into both nostrils 2 (two) times daily as needed for congestion.  . pioglitazone (ACTOS) 30 MG tablet Take 1 tablet (30 mg total) by mouth daily with lunch.  . potassium chloride SA (K-DUR,KLOR-CON) 20 MEQ tablet Take 20 mEq by mouth daily as needed (with Lasix).   . pseudoephedrine-guaifenesin (MUCINEX D) 60-600  MG per tablet Take 1 tablet by mouth every 12 (twelve) hours as needed for congestion.   . senna-docusate (SENOKOT-S) 8.6-50 MG tablet Take 1 tablet by mouth at bedtime as needed for mild constipation.  . sodium chloride (OCEAN) 0.65 % SOLN nasal spray Place 1 spray into both nostrils 2 (two) times daily as needed for congestion.  . tamsulosin (FLOMAX) 0.4 MG CAPS capsule Take 0.4 mg by mouth every other day.  . temazepam (RESTORIL) 15 MG capsule Take 15 mg by mouth at bedtime as needed for sleep.   Marland Kitchen UNABLE TO FIND Apply 1 application topically every 6 (six) hours as needed (pain). Med Name: *Ibuprofen 15% Baclofen 1% Lidocaine 10% Gabapentin 3% Topical Cream** To wrist  . zinc gluconate 50 MG tablet Take 50 mg by mouth daily.     Allergies:   Amoxicillin-pot clavulanate and Morphine and related   Social History   Socioeconomic History  . Marital status: Married    Spouse name: Not on file  . Number of children: 2  . Years of education: Not on file  . Highest education level: Professional school degree (e.g., MD, DDS, DVM, JD)  Occupational History  . Occupation: retired Chief Executive Officer  Social Needs  . Financial resource strain: Not on file  . Food insecurity:    Worry: Not on file    Inability: Not on file  . Transportation needs:    Medical: Not on file    Non-medical: Not on file  Tobacco Use  . Smoking status: Former Smoker    Years: 40.00    Types: Cigarettes    Last attempt to quit: 10/27/2005    Years since quitting: 12.2  . Smokeless tobacco: Never Used  Substance and Sexual Activity  . Alcohol use: Yes    Alcohol/week: 2.0 - 3.0 standard drinks    Types: 2 - 3 Cans of beer per week    Comment: occasional- weekly  . Drug use: No  . Sexual activity: Never  Lifestyle  . Physical activity:    Days per week: Not on file    Minutes per session: Not on file  . Stress: Not on file  Relationships  . Social connections:  Talks on phone: Not on file    Gets together: Not on  file    Attends religious service: Not on file    Active member of club or organization: Not on file    Attends meetings of clubs or organizations: Not on file    Relationship status: Not on file  Other Topics Concern  . Not on file  Social History Narrative   He lives with wife in a 1 story home, three steps to entire home.  2 children.  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       Family History: The patient's family history includes Heart disease in his father and mother. There is no history of Anesthesia problems, Hypotension, Malignant hyperthermia, or Pseudochol deficiency.  ROS:   Please see the history of present illness.    Leg swelling, leg pain, constipation, cough, and easy bruising.  Also difficulty with balance.  All other systems reviewed and are negative.  EKGs/Labs/Other Studies Reviewed:    The following studies were reviewed today: No.  EKG:  EKG is not ordered today.  The ekg ordered today demonstrates sinus bradycardia, right bundle branch block, slight left axis deviation.  Recent Labs: 08/16/2017: TSH 6.62 11/24/2017: ALT 11 12/02/2017: BUN 24; Creatinine, Ser 1.59; Hemoglobin 10.5; Platelets 206; Potassium 4.2; Sodium 137  Recent Lipid Panel No results found for: CHOL, TRIG, HDL, CHOLHDL, VLDL, LDLCALC, LDLDIRECT  Physical Exam:    VS:  BP 138/60   Pulse (!) 55   Ht 5\' 11"  (1.803 m)   Wt 256 lb 1.9 oz (116.2 kg)   BMI 35.72 kg/m     Wt Readings from Last 3 Encounters:  02/03/18 256 lb 1.9 oz (116.2 kg)  01/27/18 264 lb (119.7 kg)  01/07/18 264 lb (119.7 kg)     GEN: Obese and chronically ill-appearing but well nourished, well developed in no acute distress HEENT: Normal NECK: No JVD. LYMPHATICS: No lymphadenopathy CARDIAC: RRR, apical 1/6 to 2/6 systolic murmur, no gallop, bilateral right greater than left 3+ edema ankles to upper femoral pitting edema. VASCULAR: Difficult to palpate pedal pulses.  Faint left bruits. RESPIRATORY:  Clear to  auscultation without rales, wheezing or rhonchi  ABDOMEN: Soft, non-tender, non-distended, No pulsatile mass, MUSCULOSKELETAL: No deformity  SKIN: Warm and dry NEUROLOGIC:  Alert and oriented x 3 PSYCHIATRIC:  Normal affect   ASSESSMENT:    1. Acute on chronic diastolic HF (heart failure) ([place])   2. Chronic kidney disease, stage III (moderate) ([facility])   3. S/P CABG (coronary artery bypass graft)   4. Peripheral vascular disease, unspecified ([place])   5. Essential hypertension    PLAN:    In order of problems listed above:  1. Not sure if this is acute decompensated heart failure.  We will step back and check an albumin, repeat TSH, BNP, kidney function, perform 2D Doppler echocardiogram.  Adjustment in diuretic regimen may be necessary. 2. Kidney function will be reassessed 3. No anginal complaints 4. Not addressed other than as to evaluate lower extremity edema 5. Relatively low blood pressure  Discordant with reference to heart failure.  Neck veins are flat with the patient sitting.  He does complain of orthopnea and has lower extremity swelling.  He is overall volume overloaded.  Need to ensure that we are not dealing with edema related to low oncotic pressure.  Thyroid is being treated so I do not believe this represents myxedema.  Further management strategies after blood work is done.   Medication  Adjustments/Labs and Tests Ordered: Current medicines are reviewed at length with the patient today.  Concerns regarding medicines are outlined above.  No orders of the defined types were placed in this encounter.  No orders of the defined types were placed in this encounter.   There are no Patient Instructions on file for this visit.   Signed, Sinclair Grooms, MD  02/03/2018 11:56 AM    [place]

## 2018-02-03 ENCOUNTER — Telehealth: Payer: Self-pay | Admitting: *Deleted

## 2018-02-03 ENCOUNTER — Encounter: Payer: Self-pay | Admitting: Interventional Cardiology

## 2018-02-03 ENCOUNTER — Ambulatory Visit: Payer: Medicare HMO | Admitting: Interventional Cardiology

## 2018-02-03 VITALS — BP 138/60 | HR 55 | Ht 71.0 in | Wt 256.1 lb

## 2018-02-03 DIAGNOSIS — I5033 Acute on chronic diastolic (congestive) heart failure: Secondary | ICD-10-CM

## 2018-02-03 DIAGNOSIS — I739 Peripheral vascular disease, unspecified: Secondary | ICD-10-CM | POA: Diagnosis not present

## 2018-02-03 DIAGNOSIS — I5030 Unspecified diastolic (congestive) heart failure: Secondary | ICD-10-CM | POA: Diagnosis not present

## 2018-02-03 DIAGNOSIS — R609 Edema, unspecified: Secondary | ICD-10-CM | POA: Diagnosis not present

## 2018-02-03 DIAGNOSIS — Z5181 Encounter for therapeutic drug level monitoring: Secondary | ICD-10-CM | POA: Diagnosis not present

## 2018-02-03 DIAGNOSIS — N183 Chronic kidney disease, stage 3 unspecified: Secondary | ICD-10-CM

## 2018-02-03 DIAGNOSIS — R400 Somnolence: Secondary | ICD-10-CM | POA: Diagnosis not present

## 2018-02-03 DIAGNOSIS — I5032 Chronic diastolic (congestive) heart failure: Secondary | ICD-10-CM

## 2018-02-03 DIAGNOSIS — Z951 Presence of aortocoronary bypass graft: Secondary | ICD-10-CM | POA: Diagnosis not present

## 2018-02-03 DIAGNOSIS — I10 Essential (primary) hypertension: Secondary | ICD-10-CM

## 2018-02-03 DIAGNOSIS — E039 Hypothyroidism, unspecified: Secondary | ICD-10-CM | POA: Diagnosis not present

## 2018-02-03 DIAGNOSIS — Z23 Encounter for immunization: Secondary | ICD-10-CM | POA: Diagnosis not present

## 2018-02-03 LAB — CBC
HEMATOCRIT: 32.5 % — AB (ref 37.5–51.0)
Hemoglobin: 10.7 g/dL — ABNORMAL LOW (ref 13.0–17.7)
MCH: 29.6 pg (ref 26.6–33.0)
MCHC: 32.9 g/dL (ref 31.5–35.7)
MCV: 90 fL (ref 79–97)
Platelets: 278 10*3/uL (ref 150–450)
RBC: 3.61 x10E6/uL — ABNORMAL LOW (ref 4.14–5.80)
RDW: 15.4 % (ref 12.3–15.4)
WBC: 5.1 10*3/uL (ref 3.4–10.8)

## 2018-02-03 LAB — BASIC METABOLIC PANEL
BUN / CREAT RATIO: 34 — AB (ref 10–24)
BUN: 66 mg/dL — ABNORMAL HIGH (ref 8–27)
CO2: 29 mmol/L (ref 20–29)
CREATININE: 1.95 mg/dL — AB (ref 0.76–1.27)
Calcium: 9.7 mg/dL (ref 8.6–10.2)
Chloride: 87 mmol/L — ABNORMAL LOW (ref 96–106)
GFR calc Af Amer: 37 mL/min/{1.73_m2} — ABNORMAL LOW (ref >59)
GFR, EST NON AFRICAN AMERICAN: 32 mL/min/{1.73_m2} — AB (ref >59)
Glucose: 144 mg/dL — ABNORMAL HIGH (ref 65–99)
POTASSIUM: 3.4 mmol/L — AB (ref 3.5–5.2)
SODIUM: 136 mmol/L (ref 134–144)

## 2018-02-03 LAB — HEPATIC FUNCTION PANEL
ALBUMIN: 3.8 g/dL (ref 3.5–4.8)
ALK PHOS: 32 IU/L — AB (ref 39–117)
ALT: 13 IU/L (ref 0–44)
AST: 32 IU/L (ref 0–40)
BILIRUBIN, DIRECT: 0.31 mg/dL (ref 0.00–0.40)
Bilirubin Total: 0.5 mg/dL (ref 0.0–1.2)
Total Protein: 6.6 g/dL (ref 6.0–8.5)

## 2018-02-03 LAB — TSH: TSH: 4.57 u[IU]/mL — AB (ref 0.450–4.500)

## 2018-02-03 LAB — PRO B NATRIURETIC PEPTIDE: NT-Pro BNP: 10666 pg/mL — ABNORMAL HIGH (ref 0–486)

## 2018-02-03 MED ORDER — TORSEMIDE 20 MG PO TABS: 60.0000 mg | ORAL_TABLET | Freq: Two times a day (BID) | ORAL | 3 refills | Status: DC

## 2018-02-03 MED ORDER — ASPIRIN EC 81 MG PO TBEC: 81.0000 mg | DELAYED_RELEASE_TABLET | Freq: Every day | ORAL | 3 refills | Status: DC

## 2018-02-03 NOTE — Telephone Encounter (Signed)
-----   Message from Belva Crome, MD sent at 02/03/2018  6:19 PM EDT ----- Let the patient know labs are abnormal. Protein in normal range. Thyroid improving, and kidneys are worse. Heart test is severely abnormal. Change to Torsemide 60 mg BID, weight daily, and check BMET Monday. A copy will be sent to Lavone Orn, MD

## 2018-02-03 NOTE — Telephone Encounter (Signed)
Spoke with wife, DPR on file.  Went over results and recommendations.  Wife verbalized understanding and was in agreement with this plan.

## 2018-02-03 NOTE — Patient Instructions (Addendum)
Medication Instructions:  1) DECREASE Aspirin to 81mg  once daily  Labwork: BMET, Liver, Pro BNP, CBC and TSH today  Testing/Procedures: Your physician has requested that you have an echocardiogram. Echocardiography is a painless test that uses sound waves to create images of your heart. It provides your doctor with information about the size and shape of your heart and how well your heart's chambers and valves are working. This procedure takes approximately one hour. There are no restrictions for this procedure.    Follow-Up: Your physician recommends that you schedule a follow-up appointment in: 1 month (Keep appointment with Cecilie Kicks, NP on 10/7)   Any Other Special Instructions Will Be Listed Below (If Applicable).     If you need a refill on your cardiac medications before your next appointment, please call your pharmacy.

## 2018-02-04 DIAGNOSIS — E1152 Type 2 diabetes mellitus with diabetic peripheral angiopathy with gangrene: Secondary | ICD-10-CM | POA: Diagnosis not present

## 2018-02-04 DIAGNOSIS — I5032 Chronic diastolic (congestive) heart failure: Secondary | ICD-10-CM | POA: Diagnosis not present

## 2018-02-04 DIAGNOSIS — I130 Hypertensive heart and chronic kidney disease with heart failure and stage 1 through stage 4 chronic kidney disease, or unspecified chronic kidney disease: Secondary | ICD-10-CM | POA: Diagnosis not present

## 2018-02-04 DIAGNOSIS — N183 Chronic kidney disease, stage 3 (moderate): Secondary | ICD-10-CM | POA: Diagnosis not present

## 2018-02-04 DIAGNOSIS — E1122 Type 2 diabetes mellitus with diabetic chronic kidney disease: Secondary | ICD-10-CM | POA: Diagnosis not present

## 2018-02-04 DIAGNOSIS — T8149XD Infection following a procedure, other surgical site, subsequent encounter: Secondary | ICD-10-CM | POA: Diagnosis not present

## 2018-02-07 ENCOUNTER — Other Ambulatory Visit: Payer: Medicare HMO | Admitting: *Deleted

## 2018-02-07 DIAGNOSIS — N183 Chronic kidney disease, stage 3 (moderate): Secondary | ICD-10-CM | POA: Diagnosis not present

## 2018-02-07 DIAGNOSIS — I5032 Chronic diastolic (congestive) heart failure: Secondary | ICD-10-CM

## 2018-02-07 DIAGNOSIS — I130 Hypertensive heart and chronic kidney disease with heart failure and stage 1 through stage 4 chronic kidney disease, or unspecified chronic kidney disease: Secondary | ICD-10-CM | POA: Diagnosis not present

## 2018-02-07 DIAGNOSIS — E1122 Type 2 diabetes mellitus with diabetic chronic kidney disease: Secondary | ICD-10-CM | POA: Diagnosis not present

## 2018-02-07 DIAGNOSIS — T8149XD Infection following a procedure, other surgical site, subsequent encounter: Secondary | ICD-10-CM | POA: Diagnosis not present

## 2018-02-07 DIAGNOSIS — E1152 Type 2 diabetes mellitus with diabetic peripheral angiopathy with gangrene: Secondary | ICD-10-CM | POA: Diagnosis not present

## 2018-02-08 ENCOUNTER — Telehealth: Payer: Self-pay | Admitting: Interventional Cardiology

## 2018-02-08 DIAGNOSIS — E1152 Type 2 diabetes mellitus with diabetic peripheral angiopathy with gangrene: Secondary | ICD-10-CM | POA: Diagnosis not present

## 2018-02-08 DIAGNOSIS — I5032 Chronic diastolic (congestive) heart failure: Secondary | ICD-10-CM | POA: Diagnosis not present

## 2018-02-08 DIAGNOSIS — N183 Chronic kidney disease, stage 3 (moderate): Secondary | ICD-10-CM | POA: Diagnosis not present

## 2018-02-08 DIAGNOSIS — I130 Hypertensive heart and chronic kidney disease with heart failure and stage 1 through stage 4 chronic kidney disease, or unspecified chronic kidney disease: Secondary | ICD-10-CM | POA: Diagnosis not present

## 2018-02-08 DIAGNOSIS — T8149XD Infection following a procedure, other surgical site, subsequent encounter: Secondary | ICD-10-CM | POA: Diagnosis not present

## 2018-02-08 DIAGNOSIS — E1122 Type 2 diabetes mellitus with diabetic chronic kidney disease: Secondary | ICD-10-CM | POA: Diagnosis not present

## 2018-02-08 LAB — BASIC METABOLIC PANEL
BUN / CREAT RATIO: 34 — AB (ref 10–24)
BUN: 63 mg/dL — ABNORMAL HIGH (ref 8–27)
CALCIUM: 9.1 mg/dL (ref 8.6–10.2)
CHLORIDE: 94 mmol/L — AB (ref 96–106)
CO2: 30 mmol/L — ABNORMAL HIGH (ref 20–29)
Creatinine, Ser: 1.84 mg/dL — ABNORMAL HIGH (ref 0.76–1.27)
GFR, EST AFRICAN AMERICAN: 40 mL/min/{1.73_m2} — AB (ref >59)
GFR, EST NON AFRICAN AMERICAN: 34 mL/min/{1.73_m2} — AB (ref >59)
Glucose: 136 mg/dL — ABNORMAL HIGH (ref 65–99)
POTASSIUM: 3.9 mmol/L (ref 3.5–5.2)
Sodium: 138 mmol/L (ref 134–144)

## 2018-02-08 NOTE — Telephone Encounter (Signed)
New Message: ° ° ° ° ° °Patient is calling for results  °

## 2018-02-08 NOTE — Telephone Encounter (Signed)
Spoke with pt and advised still waiting for Dr. Tamala Julian to review labs.  Did advise K+ back to normal and kidney function came down just slightly.  Pt states that he is still having severe LE swelling, weeping and now has a couple of sores on legs.  Asked if he has used any Metolazone.  Pt unsure as wife preps meds.  Advised to ask wife and I will speak to Dr. Tamala Julian this afternoon when he is in the office.  Pt verbalized understanding and was appreciative for call.

## 2018-02-08 NOTE — Telephone Encounter (Signed)
New message:      Pt c/o swelling: STAT is pt has developed SOB within 24 hours  1) How much weight have you gained and in what time span? 7 lbs since 02/04/18  2) If swelling, where is the swelling located? Lower extremeties  3) Are you currently taking a fluid pill? yes  4) Are you currently SOB? Only when lays flat  5) Do you have a log of your daily weights (if so, list)?   6) Have you gained 3 pounds in a day or 5 pounds in a week?   7) Have you traveled recently? No

## 2018-02-08 NOTE — Telephone Encounter (Signed)
Spoke with [name] and made her aware that I have scheduled pt to come into the office tomorrow for eval.  [name] appreciative for call.

## 2018-02-08 NOTE — Telephone Encounter (Signed)
Spoke with Dr. Tamala Julian and he said to have pt come in tomorrow for eval.  Scheduled pt to see Ermalinda Barrios, PA-C tomorrow at 2pm.  Pt verbalized understanding and was appreciative for call.

## 2018-02-09 ENCOUNTER — Ambulatory Visit (HOSPITAL_COMMUNITY): Payer: Medicare HMO | Attending: Internal Medicine

## 2018-02-09 ENCOUNTER — Ambulatory Visit: Payer: Medicare HMO | Admitting: Physician Assistant

## 2018-02-09 ENCOUNTER — Other Ambulatory Visit: Payer: Self-pay

## 2018-02-09 ENCOUNTER — Encounter: Payer: Self-pay | Admitting: Physician Assistant

## 2018-02-09 VITALS — BP 140/62 | HR 58 | Ht 71.0 in | Wt 264.0 lb

## 2018-02-09 DIAGNOSIS — R0609 Other forms of dyspnea: Secondary | ICD-10-CM | POA: Insufficient documentation

## 2018-02-09 DIAGNOSIS — I5033 Acute on chronic diastolic (congestive) heart failure: Secondary | ICD-10-CM | POA: Insufficient documentation

## 2018-02-09 DIAGNOSIS — I10 Essential (primary) hypertension: Secondary | ICD-10-CM

## 2018-02-09 DIAGNOSIS — I2510 Atherosclerotic heart disease of native coronary artery without angina pectoris: Secondary | ICD-10-CM | POA: Insufficient documentation

## 2018-02-09 DIAGNOSIS — Z87891 Personal history of nicotine dependence: Secondary | ICD-10-CM | POA: Insufficient documentation

## 2018-02-09 DIAGNOSIS — Z6836 Body mass index (BMI) 36.0-36.9, adult: Secondary | ICD-10-CM | POA: Diagnosis not present

## 2018-02-09 DIAGNOSIS — N183 Chronic kidney disease, stage 3 unspecified: Secondary | ICD-10-CM

## 2018-02-09 DIAGNOSIS — Z951 Presence of aortocoronary bypass graft: Secondary | ICD-10-CM

## 2018-02-09 DIAGNOSIS — R600 Localized edema: Secondary | ICD-10-CM | POA: Insufficient documentation

## 2018-02-09 DIAGNOSIS — E119 Type 2 diabetes mellitus without complications: Secondary | ICD-10-CM | POA: Insufficient documentation

## 2018-02-09 LAB — ECHOCARDIOGRAM COMPLETE
Height: 71 in
Weight: 4224 oz

## 2018-02-09 MED ORDER — PERFLUTREN LIPID MICROSPHERE
1.0000 mL | INTRAVENOUS | Status: AC | PRN
  Administered 2018-02-09: 2 mL via INTRAVENOUS

## 2018-02-09 NOTE — Progress Notes (Signed)
Cardiology Office Note    Date:  02/09/2018   ID:  [name], DOB 1941-01-21, MRN [id number]  PCP:  Lavone Orn, MD  Cardiologist: Sinclair Grooms, MD EPS: None  Chief Complaint  Patient presents with  . Shortness of Breath    History of Present Illness:  [name] is a 77 y.o. male with history of CAD status post CABG in 3212, chronic diastolic CHF, bilateral carotid artery disease status post endarterectomy, PAD with claudication, HTN, CKD, HLD.  Patient saw Dr. Tamala Julian 02/03/2018 with worsening bilateral lower extremity edema.  Right lower extremity swelling has been present since bypass surgery in 2013.  BNP 10,666 creatinine was 1.9 5 repeat on 9/16 creatinine 1.84.  Was on Lasix 80 mg BID and switched placed on torsemide 60 mg twice daily.  Weight was 256 that day.  He is scheduled for an echo.  Previous echo 04/11/2015 LVEF 50 to 55% with grade 1 DD and severely dilated left atrium.  Patient called yesterday with worsening shortness of breath and swelling.  He comes in today accompanied by his wife.  Weight up to 264 lbs today.  Most given metolazone by his PCP a couple weeks ago but has not used that.  Does eat out Asian quite a bit and makes his own Japanese miso soup with bouillon.  Past Medical History:  Diagnosis Date  . Arthritis   . Atherosclerotic heart disease   . Back pain   . Bruises easily    takes Pletal daily and ASA 378m daily  . Cataract immature    right  . Chest pain   . Colon polyps   . Complication of anesthesia    hard to wake up with bypass surgery  . Cyst    on perineum;takes Doxycycline daily  . Diabetes mellitus    takes Glipizide,Metformin,and Actos daily  . Diarrhea   . Dyspnea    when lying flat  . Edema    right leg knee down swollen since fall 3 wks ago  . Foot drop, left   . GERD (gastroesophageal reflux disease)    Rolaids as needed-occ reflux  . Hemorrhoids   . History of kidney stones    at age 77  .  Hyperlipidemia    takes Tricor and Lipitor daily  . Hypertension    takes Altace,Amlodipine,and Nadolol daily  . Hypothyroidism   . Kidney stone    35+yrs ago  . Muscle pain   . Nasal polyps    hx of  . Peripheral edema    wears knee length hose-told by podiatrist to wear these  . Peripheral neuropathy    takes Gabapentin daily  . Pneumonia    as a child  . PVD (peripheral vascular disease) ([place]   . Renal insufficiency   . Seasonal allergies    takes Mucinex and Zyrtec prn  . Urinary frequency    urgency/takes Vesicare daily    Past Surgical History:  Procedure Laterality Date  . Aortobifemoral bypass    . APPLICATION OF WOUND VAC Left 10/25/2017   Procedure: Wound vac placement to left groin.;  Surgeon: FElam Dutch MD;  [facility];  Service: Vascular;  Laterality: Left;  . APPLICATION OF WOUND VAC Left 11/10/2017   Procedure: APPLICATION OF WOUND VAC;  Surgeon: FElam Dutch MD;  [place] [facility]  Service: Vascular;  Laterality: Left;  . CARDIAC CATHETERIZATION  most recent 05/2011   total of 4  .  CAROTID ANGIOGRAM N/A 06/05/2011   Procedure: CAROTID ANGIOGRAM;  Surgeon: Elam Dutch, MD;  Location: [facility] CATH LAB;  Service: Cardiovascular;  Laterality: N/A;  . CAROTID ENDARTERECTOMY  04/08/2011   left CEA  . cataract surgery Bilateral    left  . COLONOSCOPY    . COLONOSCOPY WITH PROPOFOL N/A 08/22/2013   Procedure: COLONOSCOPY WITH PROPOFOL;  Surgeon: Garlan Fair, MD;  Location: [facility] ENDOSCOPY;  Service: Endoscopy;  Laterality: N/A;  . CORONARY ARTERY BYPASS GRAFT  09/09/2011   Procedure: CORONARY ARTERY BYPASS GRAFTING (CABG);  Surgeon: Gaye Pollack, MD;  Location: [place];  Service: Open Heart Surgery;  Laterality: N/A;  Coronary artery bypass grafting  x three with Right saphenous vein harvested endoscopically and left internal mammary artery  . ENDARTERECTOMY  04/08/2011   Procedure: ENDARTERECTOMY CAROTID;  Surgeon: Elam Dutch, MD;  Location:  [facility] OR;  Service: Vascular;  Laterality: Left;  with patch angioplasty  . ENDARTERECTOMY  09/09/2011   Procedure: ENDARTERECTOMY CAROTID;  Surgeon: Elam Dutch, MD;  Location: [facility] OR;  Service: Vascular;  Laterality: Right;  with patch angioplasty  . FEMORAL-POPLITEAL BYPASS GRAFT Left 10/25/2017   Procedure: Left Common Femoral Endarterectomy and perfundaplasty, Left BYPASS GRAFT FEMORAL-BELOW KNEE POPLITEAL ARTERY.;  Surgeon: Elam Dutch, MD;  Location: [place];  Service: Vascular;  Laterality: Left;  . GROIN DEBRIDEMENT Left 11/10/2017   Procedure: GROIN DEBRIDEMENT;  Surgeon: Elam Dutch, MD;  Location: [place];  Service: Vascular;  Laterality: Left;  . LEFT HEART CATHETERIZATION WITH CORONARY ANGIOGRAM N/A 07/09/2011   Procedure: LEFT HEART CATHETERIZATION WITH CORONARY ANGIOGRAM;  Surgeon: Sinclair Grooms, MD;  Location: [facility] CATH LAB;  Service: Cardiovascular;  Laterality: N/A;  . LOWER EXTREMITY ANGIOGRAPHY N/A 10/11/2017   Procedure: LOWER EXTREMITY ANGIOGRAPHY;  Surgeon: Waynetta Sandy, MD;  Location: [place] CV LAB;  Service: Cardiovascular;  Laterality: N/A;  . Reimplantation of inferior mesenteric artery    . Repair of infrarenal abdominal aortic aneurysm    . SHOULDER ARTHROSCOPY W/ ROTATOR CUFF REPAIR     right and left-open procedures  . TONSILLECTOMY     at age 5  . TRIGGER FINGER RELEASE Right 06/28/2014   Procedure: RIGHT LONG TRIGGER RELEASE ;  Surgeon: Leanora Cover, MD;  Location: [place];  Service: Orthopedics;  Laterality: Right;  . wisdom teeth extracted     as a teenager  . WOUND DEBRIDEMENT Left 11/05/2017   Procedure: DEBRIDEMENT WOUND LEFT GROIN with wound vac placement;  Surgeon: Elam Dutch, MD;  Location: [facility] OR;  Service: Vascular;  Laterality: Left;    Current Medications: Current Meds  Medication Sig  . Ascorbic Acid (VITAMIN C PO) Take 1 tablet by mouth 2 (two) times daily.  Marland Kitchen aspirin EC 81 MG tablet Take 1 tablet  (81 mg total) by mouth daily.  Marland Kitchen atorvastatin (LIPITOR) 20 MG tablet Take 20 mg by mouth every evening.   . cephALEXin (KEFLEX) 500 MG capsule Take 1 capsule (500 mg total) by mouth every 12 (twelve) hours.  . cetirizine (ZYRTEC ALLERGY) 10 MG tablet Take 1 tablet (10 mg total) by mouth daily as needed for allergies.  . Cyanocobalamin (VITAMIN B 12 PO) Take 500 mcg by mouth daily.   Marland Kitchen erythromycin ophthalmic ointment USE A SMALL AMOUNT IN THE LEFT EYE DAILY AS NEEDED FOR IRRITATION  . fenofibrate (TRICOR) 145 MG tablet Take 145 mg by mouth every morning.  . gabapentin (NEURONTIN) 300 MG capsule  Take 300 mg by mouth 2 (two) times daily.  Marland Kitchen levothyroxine (SYNTHROID, LEVOTHROID) 100 MCG tablet Take 100 mcg by mouth daily before breakfast.   . loperamide (IMODIUM A-D) 2 MG tablet Take 2 mg by mouth daily as needed for diarrhea or loose stools.   . methylcellulose (ARTIFICIAL TEARS) 1 % ophthalmic solution Place 1 drop into both eyes 2 (two) times daily as needed (dry eyes).  . metoprolol tartrate (LOPRESSOR) 50 MG tablet Take 1 tablet (50 mg total) by mouth 2 (two) times daily.  . Multiple Vitamin (MULTIVITAMIN WITH MINERALS) TABS tablet Take 1 tablet by mouth daily.  . nitroGLYCERIN (NITROSTAT) 0.4 MG SL tablet PLACE 1 TABLET UNDER TONGUE EVERY 5 MINUTES AS NEEDED FOR CHEST PAIN.  Marland Kitchen oxymetazoline (VICKS SINEX) 0.05 % nasal spray Place 1 spray into both nostrils 2 (two) times daily as needed for congestion.  . pioglitazone (ACTOS) 30 MG tablet Take 1 tablet (30 mg total) by mouth daily with lunch.  . potassium chloride SA (K-DUR,KLOR-CON) 20 MEQ tablet Take 20 mEq by mouth daily.  . pseudoephedrine-guaifenesin (MUCINEX D) 60-600 MG per tablet Take 1 tablet by mouth every 12 (twelve) hours as needed for congestion.   . senna-docusate (SENOKOT-S) 8.6-50 MG tablet Take 1 tablet by mouth at bedtime as needed for mild constipation.  . sodium chloride (OCEAN) 0.65 % SOLN nasal spray Place 1 spray into both  nostrils 2 (two) times daily as needed for congestion.  . tamsulosin (FLOMAX) 0.4 MG CAPS capsule Take 0.4 mg by mouth every other day.  . temazepam (RESTORIL) 15 MG capsule Take 15 mg by mouth at bedtime as needed for sleep.   Marland Kitchen torsemide (DEMADEX) 20 MG tablet Take 3 tablets (60 mg total) by mouth 2 (two) times daily.  Marland Kitchen UNABLE TO FIND Apply 1 application topically every 6 (six) hours as needed (pain). Med Name: *Ibuprofen 15% Baclofen 1% Lidocaine 10% Gabapentin 3% Topical Cream** To wrist  . zinc gluconate 50 MG tablet Take 50 mg by mouth daily.     Allergies:   Amoxicillin-pot clavulanate and Morphine and related   Social History   Socioeconomic History  . Marital status: Married    Spouse name: Not on file  . Number of children: 2  . Years of education: Not on file  . Highest education level: Professional school degree (e.g., MD, DDS, DVM, JD)  Occupational History  . Occupation: retired Chief Executive Officer  Social Needs  . Financial resource strain: Not on file  . Food insecurity:    Worry: Not on file    Inability: Not on file  . Transportation needs:    Medical: Not on file    Non-medical: Not on file  Tobacco Use  . Smoking status: Former Smoker    Years: 40.00    Types: Cigarettes    Last attempt to quit: 10/27/2005    Years since quitting: 12.2  . Smokeless tobacco: Never Used  Substance and Sexual Activity  . Alcohol use: Yes    Alcohol/week: 2.0 - 3.0 standard drinks    Types: 2 - 3 Cans of beer per week    Comment: occasional- weekly  . Drug use: No  . Sexual activity: Never  Lifestyle  . Physical activity:    Days per week: Not on file    Minutes per session: Not on file  . Stress: Not on file  Relationships  . Social connections:    Talks on phone: Not on file    Gets together:  Not on file    Attends religious service: Not on file    Active member of club or organization: Not on file    Attends meetings of clubs or organizations: Not on file    Relationship  status: Not on file  Other Topics Concern  . Not on file  Social History Narrative   He lives with wife in a 1 story home, three steps to entire home.  2 children.  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       Family History:  The patient's family history includes Heart disease in his father and mother.   ROS:   Please see the history of present illness.    Review of Systems  Constitution: Positive for weight gain.  Cardiovascular: Positive for dyspnea on exertion and leg swelling.  Respiratory: Positive for shortness of breath and sleep disturbances due to breathing.   Neurological: Positive for loss of balance.   All other systems reviewed and are negative.   PHYSICAL EXAM:   VS:  BP 140/62   Pulse (!) 58   Ht 5' 11"  (1.803 m)   Wt 264 lb (119.7 kg)   SpO2 94%   BMI 36.82 kg/m   Physical Exam  GEN: Obese, in no acute distress  Neck: no JVD, carotid bruits, or masses Cardiac:RRR; no murmurs, rubs, or gallops  Respiratory: Decreased breath sounds with crackles at the bases GI: soft, nontender, nondistended, + BS Ext: +4 edema bilaterally open wounds weeping Neuro:  Alert and Oriented x 3 Psych: euthymic mood, full affect  Wt Readings from Last 3 Encounters:  02/09/18 264 lb (119.7 kg)  02/03/18 256 lb 1.9 oz (116.2 kg)  01/27/18 264 lb (119.7 kg)      Studies/Labs Reviewed:   EKG:  EKG is not ordered today.  Recent Labs: 02/03/2018: ALT 13; Hemoglobin 10.7; NT-Pro BNP 10,666; Platelets 278; TSH 4.570 02/07/2018: BUN 63; Creatinine, Ser 1.84; Potassium 3.9; Sodium 138   Lipid Panel No results found for: CHOL, TRIG, HDL, CHOLHDL, VLDL, LDLCALC, LDLDIRECT  Additional studies/ records that were reviewed today include:  2D echo 04/11/2015 Study Conclusions   - Left ventricle: The cavity size was mildly dilated. There was   mild concentric hypertrophy. Systolic function was normal. The   estimated ejection fraction was in the range of 50% to 55%. Poor    visualization of the endomyocardial border limits the accuracy of   wall motion and LVEF assessment. Patient refuse contrast. Wall   motion was normal; there were no regional wall motion   abnormalities. Doppler parameters are consistent with abnormal   left ventricular relaxation (grade 1 diastolic dysfunction). - Left atrium: The atrium was severely dilated. - Right ventricle: The cavity size was normal. Wall thickness was   normal. Systolic function was normal.      ASSESSMENT:    1. Acute on chronic diastolic CHF (congestive heart failure) ([place])   2. S/P CABG (coronary artery bypass graft)   3. Essential hypertension   4. Chronic kidney disease, stage III (moderate) (HCC)      PLAN:  In order of problems listed above:  Acute on chronic diastolic CHF is gained 9 pounds since Lasix changed to Demadex.  BNP was over 10,000.  We will add metolazone 5 mg every other day but may need to take 1 tomorrow if he does not lose weight overnight.  Increase potassium to 3 daily on days he takes metolazone.  If he does not lose weight in the  next 2 days he will need to be admitted to the hospital for IV diuresis.  Getting 2D echo today to assess LV function.  2 g sodium diet given.  Check be met and BNP next week.  CAD status post CABG in 2013 without angina  Essential hypertension blood pressure controlled on losartan  CKD stage III creatinine was 1.95, 2 days ago it was 1.84  Medication Adjustments/Labs and Tests Ordered: Current medicines are reviewed at length with the patient today.  Concerns regarding medicines are outlined above.  Medication changes, Labs and Tests ordered today are listed in the Patient Instructions below. Patient Instructions   Medication Instructions:  Your physician has recommended you make the following change in your medication:   1. TAKE: metolazone 5 mg tablet: Take every other day until your appointment next week (Take 30 minutes prior to torsemide)  2.  On the days that you take metolazone, take 3 of the potassium tablets  Labwork: BMET and proBNP at your appointment next week  Testing/Procedures: Echocardiogram today  Follow-Up: Follow up next week with Ermalinda Barrios, PA on 02/16/18 at 2:00 PM  Any Other Special Instructions Will Be Listed Below (If Applicable).  IF YOUR SYMPTOMS WORSEN, YOU NEED TO GO TO [place] FOR IV DIURESIS   If you need a refill on your cardiac medications before your next appointment, please call your pharmacy.  Two Gram Sodium Diet 2000 mg  What is Sodium? Sodium is a mineral found naturally in many foods. The most significant source of sodium in the diet is table salt, which is about 40% sodium.  Processed, convenience, and preserved foods also contain a large amount of sodium.  The body needs only 500 mg of sodium daily to function,  A normal diet provides more than enough sodium even if you do not use salt.  Why Limit Sodium? A build up of sodium in the body can cause thirst, increased blood pressure, shortness of breath, and water retention.  Decreasing sodium in the diet can reduce edema and risk of heart attack or stroke associated with high blood pressure.  Keep in mind that there are many other factors involved in these health problems.  Heredity, obesity, lack of exercise, cigarette smoking, stress and what you eat all play a role.  General Guidelines:  Do not add salt at the table or in cooking.  One teaspoon of salt contains over 2 grams of sodium.  Read food labels  Avoid processed and convenience foods  Ask your dietitian before eating any foods not dicussed in the menu planning guidelines  Consult your physician if you wish to use a salt substitute or a sodium containing medication such as antacids.  Limit milk and milk products to 16 oz (2 cups) per day.  Shopping Hints:  READ LABELS!! "Dietetic" does not necessarily mean low sodium.  Salt and other sodium ingredients are often  added to foods during processing.   Menu Planning Guidelines Food Group Choose More Often Avoid  Beverages (see also the milk group All fruit juices, low-sodium, salt-free vegetables juices, low-sodium carbonated beverages Regular vegetable or tomato juices, commercially softened water used for drinking or cooking  Breads and Cereals Enriched white, wheat, rye and pumpernickel bread, hard rolls and dinner rolls; muffins, cornbread and waffles; most dry cereals, cooked cereal without added salt; unsalted crackers and breadsticks; low sodium or homemade bread crumbs Bread, rolls and crackers with salted tops; quick breads; instant hot cereals; pancakes; commercial bread stuffing; self-rising flower  and biscuit mixes; regular bread crumbs or cracker crumbs  Desserts and Sweets Desserts and sweets mad with mild should be within allowance Instant pudding mixes and cake mixes  Fats Butter or margarine; vegetable oils; unsalted salad dressings, regular salad dressings limited to 1 Tbs; light, sour and heavy cream Regular salad dressings containing bacon fat, bacon bits, and salt pork; snack dips made with instant soup mixes or processed cheese; salted nuts  Fruits Most fresh, frozen and canned fruits Fruits processed with salt or sodium-containing ingredient (some dried fruits are processed with sodium sulfites        Vegetables Fresh, frozen vegetables and low- sodium canned vegetables Regular canned vegetables, sauerkraut, pickled vegetables, and others prepared in brine; frozen vegetables in sauces; vegetables seasoned with ham, bacon or salt pork  Condiments, Sauces, Miscellaneous  Salt substitute with physician's approval; pepper, herbs, spices; vinegar, lemon or lime juice; hot pepper sauce; garlic powder, onion powder, low sodium soy sauce (1 Tbs.); low sodium condiments (ketchup, chili sauce, mustard) in limited amounts (1 tsp.) fresh ground horseradish; unsalted tortilla chips, pretzels, potato  chips, popcorn, salsa (1/4 cup) Any seasoning made with salt including garlic salt, celery salt, onion salt, and seasoned salt; sea salt, rock salt, kosher salt; meat tenderizers; monosodium glutamate; mustard, regular soy sauce, barbecue, sauce, chili sauce, teriyaki sauce, steak sauce, Worcestershire sauce, and most flavored vinegars; canned gravy and mixes; regular condiments; salted snack foods, olives, picles, relish, horseradish sauce, catsup   Food preparation: Try these seasonings Meats:    Pork Sage, onion Serve with applesauce  Chicken Poultry seasoning, thyme, parsley Serve with cranberry sauce  Lamb Curry powder, rosemary, garlic, thyme Serve with mint sauce or jelly  Veal Marjoram, basil Serve with current jelly, cranberry sauce  Beef Pepper, bay leaf Serve with dry mustard, unsalted chive butter  Fish [place] leaf, dill Serve with unsalted lemon butter, unsalted parsley butter  Vegetables:    Asparagus Lemon juice   Broccoli Lemon juice   Carrots Mustard dressing parsley, mint, nutmeg, glazed with unsalted butter and sugar   Green beans Marjoram, lemon juice, nutmeg,dill seed   Tomatoes Basil, marjoram, onion   Spice /blend for [employer]" 4 tsp ground thyme 1 tsp ground sage 3 tsp ground rosemary 4 tsp ground marjoram   Test your knowledge 1. A product that says "Salt Free" may still contain sodium. True or False 2. Garlic Powder and Hot Pepper Sauce an be used as alternative seasonings.True or False 3. Processed foods have more sodium than fresh foods.  True or False 4. Canned Vegetables have less sodium than froze True or False  WAYS TO DECREASE YOUR SODIUM INTAKE 1. Avoid the use of added salt in cooking and at the table.  Table salt (and other prepared seasonings which contain salt) is probably one of the greatest sources of sodium in the diet.  Unsalted foods can gain flavor from the sweet, sour, and butter taste sensations of herbs and spices.  Instead of using salt for  seasoning, try the following seasonings with the foods listed.  Remember: how you use them to enhance natural food flavors is limited only by your creativity... Allspice-Meat, fish, eggs, fruit, peas, red and yellow vegetables Almond Extract-Fruit baked goods Anise Seed-Sweet breads, fruit, carrots, beets, cottage cheese, cookies (tastes like licorice) Basil-Meat, fish, eggs, vegetables, rice, vegetables salads, soups, sauces Bay Leaf-Meat, fish, stews, poultry Burnet-Salad, vegetables (cucumber-like flavor) Caraway Seed-Bread, cookies, cottage cheese, meat, vegetables, cheese, rice Cardamon-Baked goods, fruit, soups Celery Powder  or seed-Salads, salad dressings, sauces, meatloaf, soup, bread.Do not use  celery salt Chervil-Meats, salads, fish, eggs, vegetables, cottage cheese (parsley-like flavor) Chili Power-Meatloaf, chicken cheese, corn, eggplant, egg dishes Chives-Salads cottage cheese, egg dishes, soups, vegetables, sauces Cilantro-Salsa, casseroles Cinnamon-Baked goods, fruit, pork, lamb, chicken, carrots Cloves-Fruit, baked goods, fish, pot roast, green beans, beets, carrots Coriander-Pastry, cookies, meat, salads, cheese (lemon-orange flavor) Cumin-Meatloaf, fish,cheese, eggs, cabbage,fruit pie (caraway flavor) [employer], fruit, eggs, fish, poultry, cottage cheese, vegetables Dill Seed-Meat, cottage cheese, poultry, vegetables, fish, salads, bread Fennel Seed-Bread, cookies, apples, pork, eggs, fish, beets, cabbage, cheese, Licorice-like flavor Garlic-(buds or powder) Salads, meat, poultry, fish, bread, butter, vegetables, potatoes.Do not  use garlic salt Ginger-Fruit, vegetables, baked goods, meat, fish, poultry Horseradish Root-Meet, vegetables, butter Lemon Juice or Extract-Vegetables, fruit, tea, baked goods, fish salads Mace-Baked goods fruit, vegetables, fish, poultry (taste like nutmeg) Maple Extract-Syrups Marjoram-Meat, chicken, fish, vegetables, breads, green  salads (taste like Sage) Mint-Tea, lamb, sherbet, vegetables, desserts, carrots, cabbage Mustard, Dry or Seed-Cheese, eggs, meats, vegetables, poultry Nutmeg-Baked goods, fruit, chicken, eggs, vegetables, desserts Onion Powder-Meat, fish, poultry, vegetables, cheese, eggs, bread, rice salads (Do not use   Onion salt) Orange Extract-Desserts, baked goods Oregano-Pasta, eggs, cheese, onions, pork, lamb, fish, chicken, vegetables, green salads Paprika-Meat, fish, poultry, eggs, cheese, vegetables Parsley Flakes-Butter, vegetables, meat fish, poultry, eggs, bread, salads (certain forms may   Contain sodium Pepper-Meat fish, poultry, vegetables, eggs Peppermint Extract-Desserts, baked goods Poppy Seed-Eggs, bread, cheese, fruit dressings, baked goods, noodles, vegetables, cottage  [employer], poultry, meat, fish, cauliflower, turnips,eggs bread Saffron-Rice, bread, veal, chicken, fish, eggs Sage-Meat, fish, poultry, onions, eggplant, tomateos, pork, stews Savory-Eggs, salads, poultry, meat, rice, vegetables, soups, pork Tarragon-Meat, poultry, fish, eggs, butter, vegetables (licorice-like flavor)  Thyme-Meat, poultry, fish, eggs, vegetables, (clover-like flavor), sauces, soups Tumeric-Salads, butter, eggs, fish, rice, vegetables (saffron-like flavor) Vanilla Extract-Baked goods, candy Vinegar-Salads, vegetables, meat marinades Walnut Extract-baked goods, candy  2. Choose your Foods Wisely   The following is a list of foods to avoid which are high in sodium:  Meats-Avoid all smoked, canned, salt cured, dried and kosher meat and fish as well as Anchovies   [employer] meats:Bologna, Liverwurst, Pastrami Canned meat or fish  Marinated herring Caviar    Pepperoni Corned Beef   Pizza Dried chipped beef  Salami Frozen breaded fish or meat Salt pork Frankfurters or hot dogs  Sardines Gefilte fish   Sausage Ham (boiled ham,  Proscuitto Smoked butt    spiced ham)   Spam      TV Dinners Vegetables Canned vegetables (Regular) Relish Canned mushrooms  Sauerkraut Olives    Tomato juice Pickles  Bakery and Dessert Products Canned puddings  Cream pies Cheesecake   Decorated cakes Cookies  Beverages/Juices Tomato juice, regular  Gatorade   V-8 vegetable juice, regular  Breads and Cereals Biscuit mixes   Salted potato chips, corn chips, pretzels Bread stuffing mixes  Salted crackers and rolls Pancake and waffle mixes Self-rising flour  Seasonings Accent    Meat sauces Barbecue sauce  Meat tenderizer Catsup    Monosodium glutamate (MSG) Celery salt   Onion salt Chili sauce   Prepared mustard Garlic salt   Salt, seasoned salt, sea salt Gravy mixes   Soy sauce Horseradish   Steak sauce Ketchup   Tartar sauce Lite salt    Teriyaki sauce Marinade mixes   Worcestershire sauce  Others Baking powder   Cocoa and cocoa mixes Baking soda   Commercial casserole mixes Candy-caramels,  chocolate  Dehydrated soups    Bars, fudge,nougats  Instant rice and pasta mixes Canned broth or soup  Maraschino cherries Cheese, aged and processed cheese and cheese spreads  Learning Assessment Quiz  Indicated T (for True) or F (for False) for each of the following statements:  1. _____ Fresh fruits and vegetables and unprocessed grains are generally low in sodium 2. _____ Water may contain a considerable amount of sodium, depending on the source 3. _____ You can always tell if a food is high in sodium by tasting it 4. _____ Certain laxatives my be high in sodium and should be avoided unless prescribed   by a physician or pharmacist 5. _____ Salt substitutes may be used freely by anyone on a sodium restricted diet 6. _____ Sodium is present in table salt, food additives and as a natural component of   most foods 7. _____ Table salt is approximately 90% sodium 8. _____ Limiting sodium intake may help prevent excess fluid  accumulation in the body 9. _____ On a sodium-restricted diet, seasonings such as bouillon soy sauce, and    cooking wine should be used in place of table salt 10. _____ On an ingredient list, a product which lists monosodium glutamate as the first   ingredient is an appropriate food to include on a low sodium diet  Circle the best answer(s) to the following statements (Hint: there may be more than one correct answer)  11. On a low-sodium diet, some acceptable snack items are:    A. Olives  F. Bean dip   K. Grapefruit juice    B. Salted Pretzels G. Commercial Popcorn   L. Canned peaches    C. Carrot Sticks  H. Bouillon   M. Unsalted nuts   D. [place] fries  I. Peanut butter crackers N. Salami   E. Sweet pickles J. Tomato Juice   O. Pizza  12.  Seasonings that may be used freely on a reduced - sodium diet include   A. Lemon wedges F.Monosodium glutamate K. Celery seed    B.Soysauce   G. Pepper   L. Mustard powder   C. Sea salt  H. Cooking wine  M. Onion flakes   D. Vinegar  E. Prepared horseradish N. Salsa   E. Sage   J. Worcestershire sauce  O. Chutney     Signed, Ermalinda Barrios, PA-C  02/09/2018 3:20 PM    [facility] [place], [place], [place]  [address] Phone: [phone]; Fax: [phone]

## 2018-02-09 NOTE — Patient Instructions (Signed)
Medication Instructions:  Your physician has recommended you make the following change in your medication:   1. TAKE: metolazone 5 mg tablet: Take every other day until your appointment next week (Take 30 minutes prior to torsemide)  2. On the days that you take metolazone, take 3 of the potassium tablets  Labwork: BMET and proBNP at your appointment next week  Testing/Procedures: Echocardiogram today  Follow-Up: Follow up next week with Ermalinda Barrios, PA on 02/16/18 at 2:00 PM  Any Other Special Instructions Will Be Listed Below (If Applicable).  IF YOUR SYMPTOMS WORSEN, YOU NEED TO GO TO [place] FOR IV DIURESIS   If you need a refill on your cardiac medications before your next appointment, please call your pharmacy.  Two Gram Sodium Diet 2000 mg  What is Sodium? Sodium is a mineral found naturally in many foods. The most significant source of sodium in the diet is table salt, which is about 40% sodium.  Processed, convenience, and preserved foods also contain a large amount of sodium.  The body needs only 500 mg of sodium daily to function,  A normal diet provides more than enough sodium even if you do not use salt.  Why Limit Sodium? A build up of sodium in the body can cause thirst, increased blood pressure, shortness of breath, and water retention.  Decreasing sodium in the diet can reduce edema and risk of heart attack or stroke associated with high blood pressure.  Keep in mind that there are many other factors involved in these health problems.  Heredity, obesity, lack of exercise, cigarette smoking, stress and what you eat all play a role.  General Guidelines:  Do not add salt at the table or in cooking.  One teaspoon of salt contains over 2 grams of sodium.  Read food labels  Avoid processed and convenience foods  Ask your dietitian before eating any foods not dicussed in the menu planning guidelines  Consult your physician if you wish to use a salt substitute or  a sodium containing medication such as antacids.  Limit milk and milk products to 16 oz (2 cups) per day.  Shopping Hints:  READ LABELS!! "Dietetic" does not necessarily mean low sodium.  Salt and other sodium ingredients are often added to foods during processing.   Menu Planning Guidelines Food Group Choose More Often Avoid  Beverages (see also the milk group All fruit juices, low-sodium, salt-free vegetables juices, low-sodium carbonated beverages Regular vegetable or tomato juices, commercially softened water used for drinking or cooking  Breads and Cereals Enriched white, wheat, rye and pumpernickel bread, hard rolls and dinner rolls; muffins, cornbread and waffles; most dry cereals, cooked cereal without added salt; unsalted crackers and breadsticks; low sodium or homemade bread crumbs Bread, rolls and crackers with salted tops; quick breads; instant hot cereals; pancakes; commercial bread stuffing; self-rising flower and biscuit mixes; regular bread crumbs or cracker crumbs  Desserts and Sweets Desserts and sweets mad with mild should be within allowance Instant pudding mixes and cake mixes  Fats Butter or margarine; vegetable oils; unsalted salad dressings, regular salad dressings limited to 1 Tbs; light, sour and heavy cream Regular salad dressings containing bacon fat, bacon bits, and salt pork; snack dips made with instant soup mixes or processed cheese; salted nuts  Fruits Most fresh, frozen and canned fruits Fruits processed with salt or sodium-containing ingredient (some dried fruits are processed with sodium sulfites        Vegetables Fresh, frozen vegetables and low- sodium  canned vegetables Regular canned vegetables, sauerkraut, pickled vegetables, and others prepared in brine; frozen vegetables in sauces; vegetables seasoned with ham, bacon or salt pork  Condiments, Sauces, Miscellaneous  Salt substitute with physician's approval; pepper, herbs, spices; vinegar, lemon or  lime juice; hot pepper sauce; garlic powder, onion powder, low sodium soy sauce (1 Tbs.); low sodium condiments (ketchup, chili sauce, mustard) in limited amounts (1 tsp.) fresh ground horseradish; unsalted tortilla chips, pretzels, potato chips, popcorn, salsa (1/4 cup) Any seasoning made with salt including garlic salt, celery salt, onion salt, and seasoned salt; sea salt, rock salt, kosher salt; meat tenderizers; monosodium glutamate; mustard, regular soy sauce, barbecue, sauce, chili sauce, teriyaki sauce, steak sauce, Worcestershire sauce, and most flavored vinegars; canned gravy and mixes; regular condiments; salted snack foods, olives, picles, relish, horseradish sauce, catsup   Food preparation: Try these seasonings Meats:    Pork Sage, onion Serve with applesauce  Chicken Poultry seasoning, thyme, parsley Serve with cranberry sauce  Lamb Curry powder, rosemary, garlic, thyme Serve with mint sauce or jelly  Veal Marjoram, basil Serve with current jelly, cranberry sauce  Beef Pepper, bay leaf Serve with dry mustard, unsalted chive butter  Fish [place] leaf, dill Serve with unsalted lemon butter, unsalted parsley butter  Vegetables:    Asparagus Lemon juice   Broccoli Lemon juice   Carrots Mustard dressing parsley, mint, nutmeg, glazed with unsalted butter and sugar   Green beans Marjoram, lemon juice, nutmeg,dill seed   Tomatoes Basil, marjoram, onion   Spice /blend for [employer]" 4 tsp ground thyme 1 tsp ground sage 3 tsp ground rosemary 4 tsp ground marjoram   Test your knowledge 1. A product that says "Salt Free" may still contain sodium. True or False 2. Garlic Powder and Hot Pepper Sauce an be used as alternative seasonings.True or False 3. Processed foods have more sodium than fresh foods.  True or False 4. Canned Vegetables have less sodium than froze True or False  WAYS TO DECREASE YOUR SODIUM INTAKE 1. Avoid the use of added salt in cooking and at the table.  Table salt (and  other prepared seasonings which contain salt) is probably one of the greatest sources of sodium in the diet.  Unsalted foods can gain flavor from the sweet, sour, and butter taste sensations of herbs and spices.  Instead of using salt for seasoning, try the following seasonings with the foods listed.  Remember: how you use them to enhance natural food flavors is limited only by your creativity... Allspice-Meat, fish, eggs, fruit, peas, red and yellow vegetables Almond Extract-Fruit baked goods Anise Seed-Sweet breads, fruit, carrots, beets, cottage cheese, cookies (tastes like licorice) Basil-Meat, fish, eggs, vegetables, rice, vegetables salads, soups, sauces Bay Leaf-Meat, fish, stews, poultry Burnet-Salad, vegetables (cucumber-like flavor) Caraway Seed-Bread, cookies, cottage cheese, meat, vegetables, cheese, rice Cardamon-Baked goods, fruit, soups Celery Powder or seed-Salads, salad dressings, sauces, meatloaf, soup, bread.Do not use  celery salt Chervil-Meats, salads, fish, eggs, vegetables, cottage cheese (parsley-like flavor) Chili Power-Meatloaf, chicken cheese, corn, eggplant, egg dishes Chives-Salads cottage cheese, egg dishes, soups, vegetables, sauces Cilantro-Salsa, casseroles Cinnamon-Baked goods, fruit, pork, lamb, chicken, carrots Cloves-Fruit, baked goods, fish, pot roast, green beans, beets, carrots Coriander-Pastry, cookies, meat, salads, cheese (lemon-orange flavor) Cumin-Meatloaf, fish,cheese, eggs, cabbage,fruit pie (caraway flavor) [employer], fruit, eggs, fish, poultry, cottage cheese, vegetables Dill Seed-Meat, cottage cheese, poultry, vegetables, fish, salads, bread Fennel Seed-Bread, cookies, apples, pork, eggs, fish, beets, cabbage, cheese, Licorice-like flavor Garlic-(buds or powder) Salads, meat, poultry, fish, bread, butter, vegetables,  potatoes.Do not  use garlic salt Ginger-Fruit, vegetables, baked goods, meat, fish, poultry Horseradish Root-Meet,  vegetables, butter Lemon Juice or Extract-Vegetables, fruit, tea, baked goods, fish salads Mace-Baked goods fruit, vegetables, fish, poultry (taste like nutmeg) Maple Extract-Syrups Marjoram-Meat, chicken, fish, vegetables, breads, green salads (taste like Sage) Mint-Tea, lamb, sherbet, vegetables, desserts, carrots, cabbage Mustard, Dry or Seed-Cheese, eggs, meats, vegetables, poultry Nutmeg-Baked goods, fruit, chicken, eggs, vegetables, desserts Onion Powder-Meat, fish, poultry, vegetables, cheese, eggs, bread, rice salads (Do not use   Onion salt) Orange Extract-Desserts, baked goods Oregano-Pasta, eggs, cheese, onions, pork, lamb, fish, chicken, vegetables, green salads Paprika-Meat, fish, poultry, eggs, cheese, vegetables Parsley Flakes-Butter, vegetables, meat fish, poultry, eggs, bread, salads (certain forms may   Contain sodium Pepper-Meat fish, poultry, vegetables, eggs Peppermint Extract-Desserts, baked goods Poppy Seed-Eggs, bread, cheese, fruit dressings, baked goods, noodles, vegetables, cottage  [employer], poultry, meat, fish, cauliflower, turnips,eggs bread Saffron-Rice, bread, veal, chicken, fish, eggs Sage-Meat, fish, poultry, onions, eggplant, tomateos, pork, stews Savory-Eggs, salads, poultry, meat, rice, vegetables, soups, pork Tarragon-Meat, poultry, fish, eggs, butter, vegetables (licorice-like flavor)  Thyme-Meat, poultry, fish, eggs, vegetables, (clover-like flavor), sauces, soups Tumeric-Salads, butter, eggs, fish, rice, vegetables (saffron-like flavor) Vanilla Extract-Baked goods, candy Vinegar-Salads, vegetables, meat marinades Walnut Extract-baked goods, candy  2. Choose your Foods Wisely   The following is a list of foods to avoid which are high in sodium:  Meats-Avoid all smoked, canned, salt cured, dried and kosher meat and fish as well as Anchovies   [employer] meats:Bologna, Liverwurst,  Pastrami Canned meat or fish  Marinated herring Caviar    Pepperoni Corned Beef   Pizza Dried chipped beef  Salami Frozen breaded fish or meat Salt pork Frankfurters or hot dogs  Sardines Gefilte fish   Sausage Ham (boiled ham, Proscuitto Smoked butt    spiced ham)   Spam      TV Dinners Vegetables Canned vegetables (Regular) Relish Canned mushrooms  Sauerkraut Olives    Tomato juice Pickles  Bakery and Dessert Products Canned puddings  Cream pies Cheesecake   Decorated cakes Cookies  Beverages/Juices Tomato juice, regular  Gatorade   V-8 vegetable juice, regular  Breads and Cereals Biscuit mixes   Salted potato chips, corn chips, pretzels Bread stuffing mixes  Salted crackers and rolls Pancake and waffle mixes Self-rising flour  Seasonings Accent    Meat sauces Barbecue sauce  Meat tenderizer Catsup    Monosodium glutamate (MSG) Celery salt   Onion salt Chili sauce   Prepared mustard Garlic salt   Salt, seasoned salt, sea salt Gravy mixes   Soy sauce Horseradish   Steak sauce Ketchup   Tartar sauce Lite salt    Teriyaki sauce Marinade mixes   Worcestershire sauce  Others Baking powder   Cocoa and cocoa mixes Baking soda   Commercial casserole mixes Candy-caramels, chocolate  Dehydrated soups    Bars, fudge,nougats  Instant rice and pasta mixes Canned broth or soup  Maraschino cherries Cheese, aged and processed cheese and cheese spreads  Learning Assessment Quiz  Indicated T (for True) or F (for False) for each of the following statements:  1. _____ Fresh fruits and vegetables and unprocessed grains are generally low in sodium 2. _____ Water may contain a considerable amount of sodium, depending on the source 3. _____ You can always tell if a food is high in sodium by tasting it 4. _____ Certain laxatives my be high in sodium and should be avoided unless prescribed  by a physician or pharmacist 5. _____ Salt substitutes may be used freely by anyone on a  sodium restricted diet 6. _____ Sodium is present in table salt, food additives and as a natural component of   most foods 7. _____ Table salt is approximately 90% sodium 8. _____ Limiting sodium intake may help prevent excess fluid accumulation in the body 9. _____ On a sodium-restricted diet, seasonings such as bouillon soy sauce, and    cooking wine should be used in place of table salt 10. _____ On an ingredient list, a product which lists monosodium glutamate as the first   ingredient is an appropriate food to include on a low sodium diet  Circle the best answer(s) to the following statements (Hint: there may be more than one correct answer)  11. On a low-sodium diet, some acceptable snack items are:    A. Olives  F. Bean dip   K. Grapefruit juice    B. Salted Pretzels G. Commercial Popcorn   L. Canned peaches    C. Carrot Sticks  H. Bouillon   M. Unsalted nuts   D. [place] fries  I. Peanut butter crackers N. Salami   E. Sweet pickles J. Tomato Juice   O. Pizza  12.  Seasonings that may be used freely on a reduced - sodium diet include   A. Lemon wedges F.Monosodium glutamate K. Celery seed    B.Soysauce   G. Pepper   L. Mustard powder   C. Sea salt  H. Cooking wine  M. Onion flakes   D. Vinegar  E. Prepared horseradish N. Salsa   E. Sage   J. Worcestershire sauce  O. Chutney

## 2018-02-10 ENCOUNTER — Encounter (HOSPITAL_COMMUNITY): Payer: Medicare HMO

## 2018-02-10 ENCOUNTER — Telehealth: Payer: Self-pay

## 2018-02-10 ENCOUNTER — Ambulatory Visit: Payer: Medicare HMO | Admitting: Family

## 2018-02-10 DIAGNOSIS — I130 Hypertensive heart and chronic kidney disease with heart failure and stage 1 through stage 4 chronic kidney disease, or unspecified chronic kidney disease: Secondary | ICD-10-CM | POA: Diagnosis not present

## 2018-02-10 DIAGNOSIS — N183 Chronic kidney disease, stage 3 (moderate): Secondary | ICD-10-CM | POA: Diagnosis not present

## 2018-02-10 DIAGNOSIS — I5032 Chronic diastolic (congestive) heart failure: Secondary | ICD-10-CM | POA: Diagnosis not present

## 2018-02-10 DIAGNOSIS — E1122 Type 2 diabetes mellitus with diabetic chronic kidney disease: Secondary | ICD-10-CM | POA: Diagnosis not present

## 2018-02-10 DIAGNOSIS — E1152 Type 2 diabetes mellitus with diabetic peripheral angiopathy with gangrene: Secondary | ICD-10-CM | POA: Diagnosis not present

## 2018-02-10 DIAGNOSIS — T8149XD Infection following a procedure, other surgical site, subsequent encounter: Secondary | ICD-10-CM | POA: Diagnosis not present

## 2018-02-10 NOTE — Telephone Encounter (Signed)
-----   Message from Imogene Burn, PA-C sent at 02/10/2018  7:55 AM EDT ----- Marye Round, I'm not sure why this echo isn't in my inbox? Maybe it went to Dr. Tamala Julian since he ordered it originally? Can you let patient know his heart function has declined to 40-45% and heart has trouble relaxing? If you can check on him later today to make sure he's diuresing? Let me know. He may end up in the hospital. Selinda Eon

## 2018-02-10 NOTE — Telephone Encounter (Signed)
Called patient and made him aware that his heart function has declined to 40-45% and heart has trouble relaxing. Patient states that he felt a little better today and has lost a few lbs. He states that the swelling in his legs are just slightly better and he was able to perform PT on his legs today. He states that he has been urinating a lot since taking the metolazone. Instructed the patient to let us know if his Sx change or worsen.

## 2018-02-11 ENCOUNTER — Other Ambulatory Visit (HOSPITAL_COMMUNITY): Payer: Medicare HMO

## 2018-02-11 NOTE — Telephone Encounter (Signed)
Thank you :)

## 2018-02-14 DIAGNOSIS — E1152 Type 2 diabetes mellitus with diabetic peripheral angiopathy with gangrene: Secondary | ICD-10-CM | POA: Diagnosis not present

## 2018-02-14 DIAGNOSIS — I5032 Chronic diastolic (congestive) heart failure: Secondary | ICD-10-CM | POA: Diagnosis not present

## 2018-02-14 DIAGNOSIS — N183 Chronic kidney disease, stage 3 (moderate): Secondary | ICD-10-CM | POA: Diagnosis not present

## 2018-02-14 DIAGNOSIS — E1122 Type 2 diabetes mellitus with diabetic chronic kidney disease: Secondary | ICD-10-CM | POA: Diagnosis not present

## 2018-02-14 DIAGNOSIS — T8149XD Infection following a procedure, other surgical site, subsequent encounter: Secondary | ICD-10-CM | POA: Diagnosis not present

## 2018-02-14 DIAGNOSIS — I130 Hypertensive heart and chronic kidney disease with heart failure and stage 1 through stage 4 chronic kidney disease, or unspecified chronic kidney disease: Secondary | ICD-10-CM | POA: Diagnosis not present

## 2018-02-15 DIAGNOSIS — E1152 Type 2 diabetes mellitus with diabetic peripheral angiopathy with gangrene: Secondary | ICD-10-CM | POA: Diagnosis not present

## 2018-02-15 DIAGNOSIS — E1122 Type 2 diabetes mellitus with diabetic chronic kidney disease: Secondary | ICD-10-CM | POA: Diagnosis not present

## 2018-02-15 DIAGNOSIS — I5032 Chronic diastolic (congestive) heart failure: Secondary | ICD-10-CM | POA: Diagnosis not present

## 2018-02-15 DIAGNOSIS — T8149XD Infection following a procedure, other surgical site, subsequent encounter: Secondary | ICD-10-CM | POA: Diagnosis not present

## 2018-02-15 DIAGNOSIS — N183 Chronic kidney disease, stage 3 (moderate): Secondary | ICD-10-CM | POA: Diagnosis not present

## 2018-02-15 DIAGNOSIS — I130 Hypertensive heart and chronic kidney disease with heart failure and stage 1 through stage 4 chronic kidney disease, or unspecified chronic kidney disease: Secondary | ICD-10-CM | POA: Diagnosis not present

## 2018-02-16 ENCOUNTER — Encounter: Payer: Self-pay | Admitting: Physician Assistant

## 2018-02-16 ENCOUNTER — Ambulatory Visit: Payer: Medicare HMO | Admitting: Physician Assistant

## 2018-02-16 VITALS — BP 130/52 | HR 60 | Ht 71.0 in | Wt 254.0 lb

## 2018-02-16 DIAGNOSIS — N183 Chronic kidney disease, stage 3 unspecified: Secondary | ICD-10-CM

## 2018-02-16 DIAGNOSIS — I5043 Acute on chronic combined systolic (congestive) and diastolic (congestive) heart failure: Secondary | ICD-10-CM

## 2018-02-16 DIAGNOSIS — I10 Essential (primary) hypertension: Secondary | ICD-10-CM

## 2018-02-16 DIAGNOSIS — Z951 Presence of aortocoronary bypass graft: Secondary | ICD-10-CM | POA: Diagnosis not present

## 2018-02-16 DIAGNOSIS — E1169 Type 2 diabetes mellitus with other specified complication: Secondary | ICD-10-CM | POA: Diagnosis not present

## 2018-02-16 MED ORDER — CARVEDILOL 12.5 MG PO TABS: 12.5000 mg | ORAL_TABLET | Freq: Two times a day (BID) | ORAL | 3 refills | Status: DC

## 2018-02-16 NOTE — Patient Instructions (Addendum)
Medication Instructions:  Your physician has recommended you make the following change in your medication:  1.  STOP the Metprolol 2.  START Carvedilol 12.5 mg taking 1 tablet twice a day  3.  CONTINUE taking the Metolazone 3X's a wee (Monday Wednesday & Friday)    Labwork: TODAY:  BMET  Testing/Procedures: None ordered  Follow-Up: You have been referred to [facility].    Your physician recommends that you schedule a follow-up appointment in: IF YOU CAN BET [place] 02/28/18, WE CAN CANCEL YOUR APPT WITH [place] ON 02/28/18.  Any Other Special Instructions Will Be Listed Below (If Applicable).     If you need a refill on your cardiac medications before your next appointment, please call your pharmacy.

## 2018-02-16 NOTE — Progress Notes (Signed)
Cardiology Office Note    Date:  02/16/2018   ID:  [name], DOB November 17, 1940, MRN [id number]  PCP:  Lavone Orn, MD  Cardiologist: Sinclair Grooms, MD EPS: None  Chief Complaint  Patient presents with  . Follow-up    History of Present Illness:  [name] is a 77 y.o. male with history of CAD status post CABG in 6789, chronic diastolic CHF, bilateral carotid artery disease status post endarterectomy, PAD with claudication, HTN, CKD, HLD.   Patient saw Dr. Tamala Julian 02/03/2018 with worsening bilateral lower extremity edema.  Right lower extremity swelling has been present since bypass surgery in 2013.  BNP 10,666 creatinine was 1.9 5 repeat on 9/16 creatinine 1.84.  Was on Lasix 80 mg BID and switched placed on torsemide 60 mg twice daily.  Weight was 256 that day.  He is scheduled for an echo.  Previous echo 04/11/2015 LVEF 50 to 55% with grade 1 DD and severely dilated left atrium.   I saw the patient 02/09/2018 with worsening edema, 9 pound weight gain and BNP over 10,000.  I added metolazone 5 mg every other day and increase his potassium.  He was eating a lot of salt and [place] food.  2 g sodium diet given.  2D echo that day showed LVEF declined to 40 to 45% with anterior hypokinesis and grade 2 DD with high LV filling pressures.  Patient comes in today for follow-up.  He has lost 10 pounds.  Does not think he feels much better.  Still short of breath and has significant edema.  Wondering if he has cellulitis although he has been on Keflex for a long time because of his groin infection.  Has some weeping from his legs.    Past Medical History:  Diagnosis Date  . Arthritis   . Atherosclerotic heart disease   . Back pain   . Bruises easily    takes Pletal daily and ASA 325mg  daily  . Cataract immature    right  . Chest pain   . Colon polyps   . Complication of anesthesia    hard to wake up with bypass surgery  . Cyst    on perineum;takes Doxycycline daily  .  Diabetes mellitus    takes Glipizide,Metformin,and Actos daily  . Diarrhea   . Dyspnea    when lying flat  . Edema    right leg knee down swollen since fall 3 wks ago  . Foot drop, left   . GERD (gastroesophageal reflux disease)    Rolaids as needed-occ reflux  . Hemorrhoids   . History of kidney stones    at age 64   . Hyperlipidemia    takes Tricor and Lipitor daily  . Hypertension    takes Altace,Amlodipine,and Nadolol daily  . Hypothyroidism   . Kidney stone    35+yrs ago  . Muscle pain   . Nasal polyps    hx of  . Peripheral edema    wears knee length hose-told by podiatrist to wear these  . Peripheral neuropathy    takes Gabapentin daily  . Pneumonia    as a child  . PVD (peripheral vascular disease) ([place])   . Renal insufficiency   . Seasonal allergies    takes Mucinex and Zyrtec prn  . Urinary frequency    urgency/takes Vesicare daily    Past Surgical History:  Procedure Laterality Date  . Aortobifemoral bypass    . APPLICATION OF WOUND VAC Left  10/25/2017   Procedure: Wound vac placement to left groin.;  Surgeon: Elam Dutch, MD;  Location: [facility] OR;  Service: Vascular;  Laterality: Left;  . APPLICATION OF WOUND VAC Left 11/10/2017   Procedure: APPLICATION OF WOUND VAC;  Surgeon: Elam Dutch, MD;  Location: [place];  Service: Vascular;  Laterality: Left;  . CARDIAC CATHETERIZATION  most recent 05/2011   total of 4  . CAROTID ANGIOGRAM N/A 06/05/2011   Procedure: CAROTID ANGIOGRAM;  Surgeon: Elam Dutch, MD;  Location: [facility] CATH LAB;  Service: Cardiovascular;  Laterality: N/A;  . CAROTID ENDARTERECTOMY  04/08/2011   left CEA  . cataract surgery Bilateral    left  . COLONOSCOPY    . COLONOSCOPY WITH PROPOFOL N/A 08/22/2013   Procedure: COLONOSCOPY WITH PROPOFOL;  Surgeon: Garlan Fair, MD;  Location: [facility] ENDOSCOPY;  Service: Endoscopy;  Laterality: N/A;  . CORONARY ARTERY BYPASS GRAFT  09/09/2011   Procedure: CORONARY ARTERY BYPASS GRAFTING (CABG);   Surgeon: Gaye Pollack, MD;  Location: [place];  Service: Open Heart Surgery;  Laterality: N/A;  Coronary artery bypass grafting  x three with Right saphenous vein harvested endoscopically and left internal mammary artery  . ENDARTERECTOMY  04/08/2011   Procedure: ENDARTERECTOMY CAROTID;  Surgeon: Elam Dutch, MD;  Location: [facility] OR;  Service: Vascular;  Laterality: Left;  with patch angioplasty  . ENDARTERECTOMY  09/09/2011   Procedure: ENDARTERECTOMY CAROTID;  Surgeon: Elam Dutch, MD;  Location: [facility] OR;  Service: Vascular;  Laterality: Right;  with patch angioplasty  . FEMORAL-POPLITEAL BYPASS GRAFT Left 10/25/2017   Procedure: Left Common Femoral Endarterectomy and perfundaplasty, Left BYPASS GRAFT FEMORAL-BELOW KNEE POPLITEAL ARTERY.;  Surgeon: Elam Dutch, MD;  Location: [place];  Service: Vascular;  Laterality: Left;  . GROIN DEBRIDEMENT Left 11/10/2017   Procedure: GROIN DEBRIDEMENT;  Surgeon: Elam Dutch, MD;  Location: [place];  Service: Vascular;  Laterality: Left;  . LEFT HEART CATHETERIZATION WITH CORONARY ANGIOGRAM N/A 07/09/2011   Procedure: LEFT HEART CATHETERIZATION WITH CORONARY ANGIOGRAM;  Surgeon: Sinclair Grooms, MD;  Location: [facility] CATH LAB;  Service: Cardiovascular;  Laterality: N/A;  . LOWER EXTREMITY ANGIOGRAPHY N/A 10/11/2017   Procedure: LOWER EXTREMITY ANGIOGRAPHY;  Surgeon: Waynetta Sandy, MD;  Location: [place] CV LAB;  Service: Cardiovascular;  Laterality: N/A;  . Reimplantation of inferior mesenteric artery    . Repair of infrarenal abdominal aortic aneurysm    . SHOULDER ARTHROSCOPY W/ ROTATOR CUFF REPAIR     right and left-open procedures  . TONSILLECTOMY     at age 74  . TRIGGER FINGER RELEASE Right 06/28/2014   Procedure: RIGHT LONG TRIGGER RELEASE ;  Surgeon: Leanora Cover, MD;  Location: [place];  Service: Orthopedics;  Laterality: Right;  . wisdom teeth extracted     as a teenager  . WOUND DEBRIDEMENT Left 11/05/2017    Procedure: DEBRIDEMENT WOUND LEFT GROIN with wound vac placement;  Surgeon: Elam Dutch, MD;  Location: [facility] OR;  Service: Vascular;  Laterality: Left;    Current Medications: Current Meds  Medication Sig  . Ascorbic Acid (VITAMIN C PO) Take 1 tablet by mouth 2 (two) times daily.  Marland Kitchen aspirin EC 81 MG tablet Take 1 tablet (81 mg total) by mouth daily.  Marland Kitchen atorvastatin (LIPITOR) 20 MG tablet Take 20 mg by mouth every evening.   . cephALEXin (KEFLEX) 500 MG capsule Take 1 capsule (500 mg total) by mouth every 12 (twelve) hours.  Marland Kitchen  cetirizine (ZYRTEC ALLERGY) 10 MG tablet Take 1 tablet (10 mg total) by mouth daily as needed for allergies.  . Cyanocobalamin (VITAMIN B 12 PO) Take 500 mcg by mouth daily.   Marland Kitchen erythromycin ophthalmic ointment USE A SMALL AMOUNT IN THE LEFT EYE DAILY AS NEEDED FOR IRRITATION  . fenofibrate (TRICOR) 145 MG tablet Take 145 mg by mouth every morning.  . gabapentin (NEURONTIN) 300 MG capsule Take 300 mg by mouth 2 (two) times daily.  Marland Kitchen levothyroxine (SYNTHROID, LEVOTHROID) 100 MCG tablet Take 100 mcg by mouth daily before breakfast.   . loperamide (IMODIUM A-D) 2 MG tablet Take 2 mg by mouth daily as needed for diarrhea or loose stools.   . methylcellulose (ARTIFICIAL TEARS) 1 % ophthalmic solution Place 1 drop into both eyes 2 (two) times daily as needed (dry eyes).  . metolazone (ZAROXOLYN) 5 MG tablet Take 5 mg by mouth every other day. Take every other day until your appointment Wednesday 9/25  . Multiple Vitamin (MULTIVITAMIN WITH MINERALS) TABS tablet Take 1 tablet by mouth daily.  . nitroGLYCERIN (NITROSTAT) 0.4 MG SL tablet PLACE 1 TABLET UNDER TONGUE EVERY 5 MINUTES AS NEEDED FOR CHEST PAIN.  Marland Kitchen oxymetazoline (VICKS SINEX) 0.05 % nasal spray Place 1 spray into both nostrils 2 (two) times daily as needed for congestion.  . pioglitazone (ACTOS) 30 MG tablet Take 1 tablet (30 mg total) by mouth daily with lunch.  . potassium chloride SA (K-DUR,KLOR-CON) 20 MEQ  tablet Take 20 mEq by mouth daily.  . pseudoephedrine-guaifenesin (MUCINEX D) 60-600 MG per tablet Take 1 tablet by mouth every 12 (twelve) hours as needed for congestion.   . senna-docusate (SENOKOT-S) 8.6-50 MG tablet Take 1 tablet by mouth at bedtime as needed for mild constipation.  . sodium chloride (OCEAN) 0.65 % SOLN nasal spray Place 1 spray into both nostrils 2 (two) times daily as needed for congestion.  . tamsulosin (FLOMAX) 0.4 MG CAPS capsule Take 0.4 mg by mouth every other day.  . temazepam (RESTORIL) 15 MG capsule Take 15 mg by mouth at bedtime as needed for sleep.   Marland Kitchen torsemide (DEMADEX) 20 MG tablet Take 3 tablets (60 mg total) by mouth 2 (two) times daily.  Marland Kitchen UNABLE TO FIND Apply 1 application topically every 6 (six) hours as needed (pain). Med Name: *Ibuprofen 15% Baclofen 1% Lidocaine 10% Gabapentin 3% Topical Cream** To wrist  . zinc gluconate 50 MG tablet Take 50 mg by mouth daily.  . [DISCONTINUED] metoprolol tartrate (LOPRESSOR) 50 MG tablet Take 1 tablet (50 mg total) by mouth 2 (two) times daily.     Allergies:   Amoxicillin-pot clavulanate and Morphine and related   Social History   Socioeconomic History  . Marital status: Married    Spouse name: Not on file  . Number of children: 2  . Years of education: Not on file  . Highest education level: Professional school degree (e.g., MD, DDS, DVM, JD)  Occupational History  . Occupation: retired Chief Executive Officer  Social Needs  . Financial resource strain: Not on file  . Food insecurity:    Worry: Not on file    Inability: Not on file  . Transportation needs:    Medical: Not on file    Non-medical: Not on file  Tobacco Use  . Smoking status: Former Smoker    Years: 40.00    Types: Cigarettes    Last attempt to quit: 10/27/2005    Years since quitting: 12.3  . Smokeless tobacco: Never Used  Substance and Sexual Activity  . Alcohol use: Yes    Alcohol/week: 2.0 - 3.0 standard drinks    Types: 2 - 3 Cans of beer per  week    Comment: occasional- weekly  . Drug use: No  . Sexual activity: Never  Lifestyle  . Physical activity:    Days per week: Not on file    Minutes per session: Not on file  . Stress: Not on file  Relationships  . Social connections:    Talks on phone: Not on file    Gets together: Not on file    Attends religious service: Not on file    Active member of club or organization: Not on file    Attends meetings of clubs or organizations: Not on file    Relationship status: Not on file  Other Topics Concern  . Not on file  Social History Narrative   He lives with wife in a 1 story home, three steps to entire home.  2 children.  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       Family History:  The patient's family history includes Heart disease in his father and mother.   ROS:   Please see the history of present illness.    Review of Systems  Cardiovascular: Positive for dyspnea on exertion and leg swelling.  Respiratory: Positive for sleep disturbances due to breathing.   Genitourinary: Positive for frequency.   All other systems reviewed and are negative.   PHYSICAL EXAM:   VS:  BP (!) 130/52   Pulse 60   Ht 5\' 11"  (1.803 m)   Wt 254 lb (115.2 kg)   BMI 35.43 kg/m   Physical Exam  GEN: Obese, in no acute distress  Neck: Slight increase JVD, no carotid bruits, or masses Cardiac:RRR; no murmurs, rubs, or gallops  Respiratory: Decreased breath sounds with fine crackles at the bases GI: soft, nontender, nondistended, + BS Ext: +4 edema all the way up with erythema and weeping  neuro:  Alert and Oriented x 3 Psych: euthymic mood, full affect  Wt Readings from Last 3 Encounters:  02/16/18 254 lb (115.2 kg)  02/09/18 264 lb (119.7 kg)  02/03/18 256 lb 1.9 oz (116.2 kg)      Studies/Labs Reviewed:   EKG:  EKG is not ordered today.   Recent Labs: 02/03/2018: ALT 13; Hemoglobin 10.7; NT-Pro BNP 10,666; Platelets 278; TSH 4.570 02/07/2018: BUN 63; Creatinine, Ser 1.84;  Potassium 3.9; Sodium 138   Lipid Panel No results found for: CHOL, TRIG, HDL, CHOLHDL, VLDL, LDLCALC, LDLDIRECT  Additional studies/ records that were reviewed today include:  2D echo 02/09/2018 Study Conclusions   - Left ventricle: The cavity size was normal. There was moderate   concentric hypertrophy. Systolic function was mildly to   moderately reduced. The estimated ejection fraction was in the   range of 40% to 45%. Anterior hypokinesis. Grade 2 DD with high   LV filling pressure. - Aortic valve: Calcified without stenosis. Mean gradient (S): 6 mm   Hg. - Left atrium: Moderately dilated. - Right atrium: The atrium was mildly dilated. - Inferior vena cava: The vessel was normal in size. The   respirophasic diameter changes were in the normal range (>= 50%),   consistent with normal central venous pressure.   Impressions:   - Technically difficult study. LVEF 40-45%, more significant   anterior hypokinesis, grade 2 DD, high LV filling pressure,   moderate LAE, mild RAE.   2D echo 04/11/2015  Study Conclusions   - Left ventricle: The cavity size was mildly dilated. There was   mild concentric hypertrophy. Systolic function was normal. The   estimated ejection fraction was in the range of 50% to 55%. Poor   visualization of the endomyocardial border limits the accuracy of   wall motion and LVEF assessment. Patient refuse contrast. Wall   motion was normal; there were no regional wall motion   abnormalities. Doppler parameters are consistent with abnormal   left ventricular relaxation (grade 1 diastolic dysfunction). - Left atrium: The atrium was severely dilated. - Right ventricle: The cavity size was normal. Wall thickness was   normal. Systolic function was normal.        ASSESSMENT:    1. Acute on chronic combined systolic and diastolic CHF (congestive heart failure) ([place])   2. Essential hypertension   3. S/P CABG (coronary artery bypass graft)   4. Chronic  kidney disease, stage III (moderate) (H[facility])      PLAN:  In order of problems listed above:  Acute on chronic systolic and diastolic CHF with new LV dysfunction 40 to 45% with grade 2 DD and anterior hypokinesis on echo 02/09/2018.  Wall motion could not be evaluated on last echo to know if this was new.  Patient is lost 10 pounds with metolazone but still has significant edema.  He looks more comfortable at rest today.  Discussed with Dr. Tamala Julian.  We will continue metolazone, check renal function today, add spironolactone if renal allows.  Continue Demadex.  Refer to heart failure clinic.  Change metoprolol to carvedilol 12.5 mg twice daily.  Instructed again on 2 g sodium diet.  He forgot and did eat out [place] 1 day.  Hold off on any ischemic work-up at this time as he does not have angina.  Essential hypertension blood pressure stable and has room for more medications if needed.  CAD status post CABG in 2013 without angina  CKD stage III last creatinine 1.84.  Will recheck today    Medication Adjustments/Labs and Tests Ordered: Current medicines are reviewed at length with the patient today.  Concerns regarding medicines are outlined above.  Medication changes, Labs and Tests ordered today are listed in the Patient Instructions below. Patient Instructions  Medication Instructions:  Your physician has recommended you make the following change in your medication:  1.  STOP the Metprolol 2.  START Carvedilol 12.5 mg taking 1 tablet twice a day  3.  CONTINUE taking the Metolazone 3X's a wee (Monday Wednesday & Friday)    Labwork: TODAY:  BMET  Testing/Procedures: None ordered  Follow-Up: You have been referred to [place].    Your physician recommends that you schedule a follow-up appointment in: IF YOU CAN BET [place] 02/28/18, WE CAN CANCEL YOUR APPT WITH [place] ON 02/28/18.  Any Other Special Instructions Will Be Listed Below (If  Applicable).     If you need a refill on your cardiac medications before your next appointment, please call your pharmacy.      Sumner Boast, PA-C  02/16/2018 2:37 PM    [facility] [address], [place], [place]  [address] Phone: [phone]; Fax: [phone]

## 2018-02-17 ENCOUNTER — Telehealth: Payer: Self-pay

## 2018-02-17 ENCOUNTER — Other Ambulatory Visit: Payer: Self-pay

## 2018-02-17 DIAGNOSIS — I739 Peripheral vascular disease, unspecified: Secondary | ICD-10-CM

## 2018-02-17 DIAGNOSIS — I5032 Chronic diastolic (congestive) heart failure: Secondary | ICD-10-CM | POA: Diagnosis not present

## 2018-02-17 DIAGNOSIS — N183 Chronic kidney disease, stage 3 unspecified: Secondary | ICD-10-CM

## 2018-02-17 DIAGNOSIS — I130 Hypertensive heart and chronic kidney disease with heart failure and stage 1 through stage 4 chronic kidney disease, or unspecified chronic kidney disease: Secondary | ICD-10-CM | POA: Diagnosis not present

## 2018-02-17 DIAGNOSIS — E1122 Type 2 diabetes mellitus with diabetic chronic kidney disease: Secondary | ICD-10-CM | POA: Diagnosis not present

## 2018-02-17 DIAGNOSIS — T8149XD Infection following a procedure, other surgical site, subsequent encounter: Secondary | ICD-10-CM | POA: Diagnosis not present

## 2018-02-17 DIAGNOSIS — E1152 Type 2 diabetes mellitus with diabetic peripheral angiopathy with gangrene: Secondary | ICD-10-CM | POA: Diagnosis not present

## 2018-02-17 DIAGNOSIS — I779 Disorder of arteries and arterioles, unspecified: Secondary | ICD-10-CM

## 2018-02-17 DIAGNOSIS — I5043 Acute on chronic combined systolic (congestive) and diastolic (congestive) heart failure: Secondary | ICD-10-CM

## 2018-02-17 LAB — BASIC METABOLIC PANEL
BUN / CREAT RATIO: 35 — AB (ref 10–24)
BUN: 79 mg/dL — AB (ref 8–27)
CHLORIDE: 84 mmol/L — AB (ref 96–106)
CO2: 31 mmol/L — ABNORMAL HIGH (ref 20–29)
Calcium: 9.9 mg/dL (ref 8.6–10.2)
Creatinine, Ser: 2.24 mg/dL — ABNORMAL HIGH (ref 0.76–1.27)
GFR calc non Af Amer: 27 mL/min/{1.73_m2} — ABNORMAL LOW (ref >59)
GFR, EST AFRICAN AMERICAN: 31 mL/min/{1.73_m2} — AB (ref >59)
Glucose: 195 mg/dL — ABNORMAL HIGH (ref 65–99)
Potassium: 3.6 mmol/L (ref 3.5–5.2)
Sodium: 136 mmol/L (ref 134–144)

## 2018-02-17 NOTE — Telephone Encounter (Signed)
Called and spoke to patient and his wife earlier. Made them aware of lab results. Instructed for the patient to hold metolazone today and tomorrow. Instructed for the patient to continue to monitor his weight daily. Made the patient aware that if he gains 2-3 pounds overnight he may take one metolazone on Saturday. Made patient aware that I have sent a message to the CHF clinic RN to try and expedite getting his appointment scheduled. Made patient aware that if he is maintaining his weight then we will hold off on taking the metolazone. Instructed patient to call me on Monday morning and give his weights through the weekend. Patient was scheduled for BMET and BNP on 10/3. Instructed for the patient to call if his Sx change or worsen. Will work on coordinating home health to help manage patient's CHF as well. Patient and his wife both verbalize understanding and thanked me for the call.

## 2018-02-17 NOTE — Telephone Encounter (Signed)
-----   Message from Imogene Burn, PA-C sent at 02/17/2018  8:19 AM EDT ----- Kidney function up. Don't take metolazone today or tomorrow. If he gains 2-3 lbs overnight he can take one on Saturday. I decreased it to Mon/Wed/Fri but if he is maintaining his weight he can hold off on taking it. Please ask them to call us with weights. He would benefit from home health to help [place] manage. Can you also check with CHF clinic. We made a referal but can we put some urgency in this? I don't see an appt. Thanks. Will need a bmet & bnp next week. Ultimately he may need to come in to the hospital but trying to avoid.

## 2018-02-21 NOTE — Telephone Encounter (Signed)
Agree with plan. Thanks

## 2018-02-21 NOTE — Telephone Encounter (Signed)
Follow Up:     Pt's wife just wanted you to know that the pt's weight today is 249 and yesterday it was 246. If not at this number, please call [phone].Marland Kitchen

## 2018-02-21 NOTE — Telephone Encounter (Signed)
See telephone encounter from 9/26

## 2018-02-21 NOTE — Telephone Encounter (Addendum)
Returned call to patient's wife. She states that the patient's weight this morning was 249 lbs up from 246 lbs yesterday. She states that the patient's SOB or LEE has not gotten any worse. She states that the patient did not take any metolazone this weekend. She states that the patient has not had any medicine today at all because they went for an anniversary party and are on the way back home. Instructed for the patient to take his metolazone as soon as he gets back home and then take his torsemide 30 minutes later. Instructed for the patient to continue to monitor weights and let us know if his Sx worsen. Wife verbalized understanding. Will forward to Ermalinda Barrios, [place] for review.    [name]  Note    Follow Up:  Pt's wife just wanted you to know that the pt's weight today is 249 and yesterday it was 246. If not at this number, please call [phone].Marland Kitchen

## 2018-02-22 ENCOUNTER — Telehealth: Payer: Self-pay | Admitting: Interventional Cardiology

## 2018-02-22 DIAGNOSIS — T8149XD Infection following a procedure, other surgical site, subsequent encounter: Secondary | ICD-10-CM | POA: Diagnosis not present

## 2018-02-22 DIAGNOSIS — I5032 Chronic diastolic (congestive) heart failure: Secondary | ICD-10-CM | POA: Diagnosis not present

## 2018-02-22 DIAGNOSIS — E1152 Type 2 diabetes mellitus with diabetic peripheral angiopathy with gangrene: Secondary | ICD-10-CM | POA: Diagnosis not present

## 2018-02-22 DIAGNOSIS — I130 Hypertensive heart and chronic kidney disease with heart failure and stage 1 through stage 4 chronic kidney disease, or unspecified chronic kidney disease: Secondary | ICD-10-CM | POA: Diagnosis not present

## 2018-02-22 DIAGNOSIS — E1122 Type 2 diabetes mellitus with diabetic chronic kidney disease: Secondary | ICD-10-CM | POA: Diagnosis not present

## 2018-02-22 DIAGNOSIS — N183 Chronic kidney disease, stage 3 (moderate): Secondary | ICD-10-CM | POA: Diagnosis not present

## 2018-02-22 NOTE — Telephone Encounter (Signed)
Spoke with Vida Roller from [place].  She states she was at pt's residence on Thursday and his weight was 242lbs, yesterday was 249lbs and today was 248lbs.  She states edema and SOB are unchanged.  Pt more fatigued today but has been out most of the day for the last 2 days.  Pt at a get together yesterday and ate foods not on his diet.  Spoke with Dr. Tamala Julian and he said to have pt take his Metolazone tomorrow and keep plan for labs 10/3 and OV 10/7.  Spoke with Vida Roller and made her aware of recommendations.  Kelli verbalized understanding and will make pt aware.

## 2018-02-22 NOTE — Telephone Encounter (Signed)
New Message:  [name] with [facility] is requesting a call back about the patient weight gain:  [phone]   Pt c/o swelling: STAT is pt has developed SOB within 24 hours  1) How much weight have you gained and in what time span?  5 lbs  2) If swelling, where is the swelling located? Legs   3) Are you currently taking a fluid pill? Yes   4) Are you currently SOB? Yes with activity   5) Do you have a log of your daily weights (if so, list)? NO   6) Have you gained 3 pounds in a day or 5 pounds in a week? Yes   7) Have you traveled recently? NO

## 2018-02-23 DIAGNOSIS — E1152 Type 2 diabetes mellitus with diabetic peripheral angiopathy with gangrene: Secondary | ICD-10-CM | POA: Diagnosis not present

## 2018-02-23 DIAGNOSIS — E1122 Type 2 diabetes mellitus with diabetic chronic kidney disease: Secondary | ICD-10-CM | POA: Diagnosis not present

## 2018-02-23 DIAGNOSIS — T8149XD Infection following a procedure, other surgical site, subsequent encounter: Secondary | ICD-10-CM | POA: Diagnosis not present

## 2018-02-23 DIAGNOSIS — I5032 Chronic diastolic (congestive) heart failure: Secondary | ICD-10-CM | POA: Diagnosis not present

## 2018-02-23 DIAGNOSIS — N183 Chronic kidney disease, stage 3 (moderate): Secondary | ICD-10-CM | POA: Diagnosis not present

## 2018-02-23 DIAGNOSIS — I130 Hypertensive heart and chronic kidney disease with heart failure and stage 1 through stage 4 chronic kidney disease, or unspecified chronic kidney disease: Secondary | ICD-10-CM | POA: Diagnosis not present

## 2018-02-24 ENCOUNTER — Ambulatory Visit: Payer: Medicare HMO | Admitting: Vascular Surgery

## 2018-02-24 ENCOUNTER — Ambulatory Visit (INDEPENDENT_AMBULATORY_CARE_PROVIDER_SITE_OTHER): Payer: Medicare HMO | Admitting: Vascular Surgery

## 2018-02-24 ENCOUNTER — Encounter: Payer: Self-pay | Admitting: Vascular Surgery

## 2018-02-24 ENCOUNTER — Other Ambulatory Visit: Payer: Medicare HMO | Admitting: *Deleted

## 2018-02-24 ENCOUNTER — Ambulatory Visit (INDEPENDENT_AMBULATORY_CARE_PROVIDER_SITE_OTHER)
Admission: RE | Admit: 2018-02-24 | Discharge: 2018-02-24 | Disposition: A | Discharge status: Home / Self Care | Payer: Medicare HMO | Source: Ambulatory Visit | Attending: Vascular Surgery | Admitting: Vascular Surgery

## 2018-02-24 ENCOUNTER — Other Ambulatory Visit: Payer: Self-pay

## 2018-02-24 ENCOUNTER — Ambulatory Visit (INDEPENDENT_AMBULATORY_CARE_PROVIDER_SITE_OTHER)
Admission: RE | Admit: 2018-02-24 | Discharge: 2018-02-24 | Disposition: A | Discharge status: Home / Self Care | Payer: Medicare HMO | Source: Ambulatory Visit

## 2018-02-24 VITALS — BP 153/67 | HR 61 | Temp 98.8°F | Resp 20 | Ht 71.0 in | Wt 251.0 lb

## 2018-02-24 DIAGNOSIS — E871 Hypo-osmolality and hyponatremia: Secondary | ICD-10-CM | POA: Diagnosis not present

## 2018-02-24 DIAGNOSIS — E039 Hypothyroidism, unspecified: Secondary | ICD-10-CM | POA: Diagnosis present

## 2018-02-24 DIAGNOSIS — I130 Hypertensive heart and chronic kidney disease with heart failure and stage 1 through stage 4 chronic kidney disease, or unspecified chronic kidney disease: Secondary | ICD-10-CM | POA: Diagnosis not present

## 2018-02-24 DIAGNOSIS — Z86718 Personal history of other venous thrombosis and embolism: Secondary | ICD-10-CM | POA: Diagnosis not present

## 2018-02-24 DIAGNOSIS — L03116 Cellulitis of left lower limb: Secondary | ICD-10-CM | POA: Diagnosis not present

## 2018-02-24 DIAGNOSIS — J302 Other seasonal allergic rhinitis: Secondary | ICD-10-CM | POA: Diagnosis present

## 2018-02-24 DIAGNOSIS — K219 Gastro-esophageal reflux disease without esophagitis: Secondary | ICD-10-CM | POA: Diagnosis present

## 2018-02-24 DIAGNOSIS — I5033 Acute on chronic diastolic (congestive) heart failure: Secondary | ICD-10-CM | POA: Diagnosis not present

## 2018-02-24 DIAGNOSIS — I5043 Acute on chronic combined systolic (congestive) and diastolic (congestive) heart failure: Secondary | ICD-10-CM | POA: Diagnosis not present

## 2018-02-24 DIAGNOSIS — L03311 Cellulitis of abdominal wall: Secondary | ICD-10-CM | POA: Diagnosis not present

## 2018-02-24 DIAGNOSIS — E876 Hypokalemia: Secondary | ICD-10-CM | POA: Diagnosis not present

## 2018-02-24 DIAGNOSIS — L089 Local infection of the skin and subcutaneous tissue, unspecified: Secondary | ICD-10-CM | POA: Diagnosis not present

## 2018-02-24 DIAGNOSIS — N183 Chronic kidney disease, stage 3 unspecified: Secondary | ICD-10-CM

## 2018-02-24 DIAGNOSIS — I739 Peripheral vascular disease, unspecified: Secondary | ICD-10-CM

## 2018-02-24 DIAGNOSIS — T8141XA Infection following a procedure, superficial incisional surgical site, initial encounter: Secondary | ICD-10-CM | POA: Diagnosis not present

## 2018-02-24 DIAGNOSIS — N179 Acute kidney failure, unspecified: Secondary | ICD-10-CM | POA: Diagnosis not present

## 2018-02-24 DIAGNOSIS — I779 Disorder of arteries and arterioles, unspecified: Secondary | ICD-10-CM

## 2018-02-24 DIAGNOSIS — M7989 Other specified soft tissue disorders: Secondary | ICD-10-CM | POA: Diagnosis not present

## 2018-02-24 DIAGNOSIS — I82812 Embolism and thrombosis of superficial veins of left lower extremities: Secondary | ICD-10-CM | POA: Diagnosis not present

## 2018-02-24 DIAGNOSIS — R0682 Tachypnea, not elsewhere classified: Secondary | ICD-10-CM | POA: Diagnosis not present

## 2018-02-24 DIAGNOSIS — M21372 Foot drop, left foot: Secondary | ICD-10-CM | POA: Diagnosis present

## 2018-02-24 DIAGNOSIS — I5023 Acute on chronic systolic (congestive) heart failure: Secondary | ICD-10-CM | POA: Diagnosis not present

## 2018-02-24 DIAGNOSIS — Z8719 Personal history of other diseases of the digestive system: Secondary | ICD-10-CM | POA: Diagnosis not present

## 2018-02-24 DIAGNOSIS — I2510 Atherosclerotic heart disease of native coronary artery without angina pectoris: Secondary | ICD-10-CM | POA: Diagnosis present

## 2018-02-24 DIAGNOSIS — I480 Paroxysmal atrial fibrillation: Secondary | ICD-10-CM | POA: Diagnosis not present

## 2018-02-24 DIAGNOSIS — E1122 Type 2 diabetes mellitus with diabetic chronic kidney disease: Secondary | ICD-10-CM | POA: Diagnosis present

## 2018-02-24 DIAGNOSIS — I4891 Unspecified atrial fibrillation: Secondary | ICD-10-CM | POA: Diagnosis not present

## 2018-02-24 DIAGNOSIS — R0602 Shortness of breath: Secondary | ICD-10-CM | POA: Diagnosis not present

## 2018-02-24 DIAGNOSIS — E1151 Type 2 diabetes mellitus with diabetic peripheral angiopathy without gangrene: Secondary | ICD-10-CM | POA: Diagnosis present

## 2018-02-24 DIAGNOSIS — E440 Moderate protein-calorie malnutrition: Secondary | ICD-10-CM | POA: Diagnosis present

## 2018-02-24 DIAGNOSIS — E1142 Type 2 diabetes mellitus with diabetic polyneuropathy: Secondary | ICD-10-CM | POA: Diagnosis present

## 2018-02-24 DIAGNOSIS — I5030 Unspecified diastolic (congestive) heart failure: Secondary | ICD-10-CM | POA: Diagnosis not present

## 2018-02-24 DIAGNOSIS — I517 Cardiomegaly: Secondary | ICD-10-CM | POA: Diagnosis not present

## 2018-02-24 DIAGNOSIS — I10 Essential (primary) hypertension: Secondary | ICD-10-CM | POA: Diagnosis not present

## 2018-02-24 DIAGNOSIS — I25810 Atherosclerosis of coronary artery bypass graft(s) without angina pectoris: Secondary | ICD-10-CM | POA: Diagnosis not present

## 2018-02-24 DIAGNOSIS — E785 Hyperlipidemia, unspecified: Secondary | ICD-10-CM | POA: Diagnosis present

## 2018-02-24 DIAGNOSIS — Z87442 Personal history of urinary calculi: Secondary | ICD-10-CM | POA: Diagnosis not present

## 2018-02-24 DIAGNOSIS — L03314 Cellulitis of groin: Secondary | ICD-10-CM | POA: Diagnosis not present

## 2018-02-24 DIAGNOSIS — R600 Localized edema: Secondary | ICD-10-CM | POA: Diagnosis not present

## 2018-02-24 DIAGNOSIS — I5040 Unspecified combined systolic (congestive) and diastolic (congestive) heart failure: Secondary | ICD-10-CM | POA: Diagnosis not present

## 2018-02-24 DIAGNOSIS — E118 Type 2 diabetes mellitus with unspecified complications: Secondary | ICD-10-CM | POA: Diagnosis not present

## 2018-02-24 MED ORDER — LEVOFLOXACIN 500 MG PO TABS: 250.0000 mg | ORAL_TABLET | Freq: Every day | ORAL | 0 refills | Status: DC

## 2018-02-24 NOTE — Progress Notes (Signed)
Patient is a 77 year old male who returns for follow-up today.  He previously underwent aortobifemoral bypass in the remote past.  This was by Dr. Amedeo Plenty.  More recently had a left femoral endarterectomy with left femoral to below-knee popliteal bypass with vein.  This was June 2019.  Subsequently had significant wound problems in the left groin requiring VAC therapy and multiple debridements.  He returns today for further follow-up.  He is still on Keflex.  He is ambulating some with a walker.  He states the wound is smaller but recently he developed erythema along the left abdominal wall.  He denies fever or chills.  He denies significant increase in drainage.  He has recently been seen by Dr. Pernell Dupre the cardiology and has some level of cardiac dysfunction and renal dysfunction.  Has chronically swollen legs.  Review of systems: He has shortness of breath with minimal exertion.  He has lost about 20 pounds in the past few months.  He denies chest pain.  Social History   Socioeconomic History  . Marital status: Married    Spouse name: Not on file  . Number of children: 2  . Years of education: Not on file  . Highest education level: Professional school degree (e.g., MD, DDS, DVM, JD)  Occupational History  . Occupation: retired Chief Executive Officer  Social Needs  . Financial resource strain: Not on file  . Food insecurity:    Worry: Not on file    Inability: Not on file  . Transportation needs:    Medical: Not on file    Non-medical: Not on file  Tobacco Use  . Smoking status: Former Smoker    Years: 40.00    Types: Cigarettes    Last attempt to quit: 10/27/2005    Years since quitting: 12.3  . Smokeless tobacco: Never Used  Substance and Sexual Activity  . Alcohol use: Yes    Alcohol/week: 2.0 - 3.0 standard drinks    Types: 2 - 3 Cans of beer per week    Comment: occasional- weekly  . Drug use: No  . Sexual activity: Never  Lifestyle  . Physical activity:    Days per week: Not on file   Minutes per session: Not on file  . Stress: Not on file  Relationships  . Social connections:    Talks on phone: Not on file    Gets together: Not on file    Attends religious service: Not on file    Active member of club or organization: Not on file    Attends meetings of clubs or organizations: Not on file    Relationship status: Not on file  . Intimate partner violence:    Fear of current or ex partner: Not on file    Emotionally abused: Not on file    Physically abused: Not on file    Forced sexual activity: Not on file  Other Topics Concern  . Not on file  Social History Narrative   He lives with wife in a 1 story home, three steps to entire home.  2 children.  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      Family History  Problem Relation Age of Onset  . Heart disease Mother   . Heart disease Father   . Anesthesia problems Neg Hx   . Hypotension Neg Hx   . Malignant hyperthermia Neg Hx   . Pseudochol deficiency Neg Hx     Current Outpatient Medications on File Prior to Visit  Medication Sig  Dispense Refill  . Ascorbic Acid (VITAMIN C PO) Take 1 tablet by mouth 2 (two) times daily.    Marland Kitchen aspirin EC 81 MG tablet Take 1 tablet (81 mg total) by mouth daily. 90 tablet 3  . atorvastatin (LIPITOR) 20 MG tablet Take 20 mg by mouth every evening.     . cephALEXin (KEFLEX) 500 MG capsule Take 1 capsule (500 mg total) by mouth every 12 (twelve) hours. 60 capsule 2  . cetirizine (ZYRTEC ALLERGY) 10 MG tablet Take 1 tablet (10 mg total) by mouth daily as needed for allergies.    . Cyanocobalamin (VITAMIN B 12 PO) Take 500 mcg by mouth daily.     Marland Kitchen erythromycin ophthalmic ointment USE A SMALL AMOUNT IN THE LEFT EYE DAILY AS NEEDED FOR IRRITATION  2  . fenofibrate (TRICOR) 145 MG tablet Take 145 mg by mouth every morning.    . gabapentin (NEURONTIN) 300 MG capsule Take 300 mg by mouth 2 (two) times daily.    Marland Kitchen levothyroxine (SYNTHROID, LEVOTHROID) 100 MCG tablet Take 100 mcg by mouth  daily before breakfast.   3  . loperamide (IMODIUM A-D) 2 MG tablet Take 2 mg by mouth daily as needed for diarrhea or loose stools.     . methylcellulose (ARTIFICIAL TEARS) 1 % ophthalmic solution Place 1 drop into both eyes 2 (two) times daily as needed (dry eyes).    . metolazone (ZAROXOLYN) 5 MG tablet Take 5 mg by mouth every other day. Take every other day until your appointment Wednesday 9/25    . Multiple Vitamin (MULTIVITAMIN WITH MINERALS) TABS tablet Take 1 tablet by mouth daily.    . nitroGLYCERIN (NITROSTAT) 0.4 MG SL tablet PLACE 1 TABLET UNDER TONGUE EVERY 5 MINUTES AS NEEDED FOR CHEST PAIN. 75 tablet 1  . oxymetazoline (VICKS SINEX) 0.05 % nasal spray Place 1 spray into both nostrils 2 (two) times daily as needed for congestion.    . pioglitazone (ACTOS) 30 MG tablet Take 1 tablet (30 mg total) by mouth daily with lunch.    . potassium chloride SA (K-DUR,KLOR-CON) 20 MEQ tablet Take 60 mEq by mouth every other day.     . pseudoephedrine-guaifenesin (MUCINEX D) 60-600 MG per tablet Take 1 tablet by mouth every 12 (twelve) hours as needed for congestion.     . senna-docusate (SENOKOT-S) 8.6-50 MG tablet Take 1 tablet by mouth at bedtime as needed for mild constipation.    . sodium chloride (OCEAN) 0.65 % SOLN nasal spray Place 1 spray into both nostrils 2 (two) times daily as needed for congestion.    . tamsulosin (FLOMAX) 0.4 MG CAPS capsule Take 0.4 mg by mouth every other day.    . temazepam (RESTORIL) 15 MG capsule Take 15 mg by mouth at bedtime as needed for sleep.     Marland Kitchen torsemide (DEMADEX) 20 MG tablet Take 3 tablets (60 mg total) by mouth 2 (two) times daily. 180 tablet 3  . UNABLE TO FIND Apply 1 application topically every 6 (six) hours as needed (pain). Med Name: *Ibuprofen 15% Baclofen 1% Lidocaine 10% Gabapentin 3% Topical Cream** To wrist    . zinc gluconate 50 MG tablet Take 50 mg by mouth daily.    . carvedilol (COREG) 12.5 MG tablet Take 1 tablet (12.5 mg total) by  mouth 2 (two) times daily. (Patient not taking: Reported on 02/24/2018) 180 tablet 3   No current facility-administered medications on file prior to visit.     Allergies  Allergen Reactions  .  Amoxicillin-Pot Clavulanate Nausea Only    Has patient had a PCN reaction causing immediate rash, facial/tongue/throat swelling, SOB or lightheadedness with hypotension: No Has patient had a PCN reaction causing severe rash involving mucus membranes or skin necrosis: No Has patient had a PCN reaction that required hospitalization: No Has patient had a PCN reaction occurring within the last 10 years: No If all of the above answers are "NO", then may proceed with Cephalosporin use.   Marland Kitchen Morphine And Related Other (See Comments)    hallucinates    Physical exam:  Vitals:   02/24/18 1140  BP: (!) 153/67  Pulse: 61  Resp: 20  Temp: 98.8 F (37.1 C)  TempSrc: Oral  SpO2: 95%  Weight: 251 lb (113.9 kg)  Height: 5\' 11"  (1.803 m)    Abdomen: Erythema covering an area about 10 cm just above the left groin incision.  There is no obvious communication with the groin incision itself.    Extremities: The left groin incision is essentially healed there is a 1.5 cm x 1.5 cm open area with a punctate sinus tract in the very center of the wound no expressible purulent or clear drainage.  Some granulation tissue was beaten back today with silver nitrate.   Left foot is edematous with no palpable pedal pulse today.  Data: Patient had a duplex ultrasound of his left leg bypass today which shows a possible inflow stenosis on the left side.  There is monophasic waveforms with reasonable velocities distally.  Bypass was patent.  It was a suboptimal study due to the depth of the vessel and lower extremity edema.  I reviewed and interpreted the study.  Assessment: Peripheral arterial disease slowly healing left groin incision still concerns for graft infection of his previous aortobifem.  The redness on his left  abdominal wall is concerning.  Hopefully this is just a skin rash from previous wound care.  This also could represent yeast.  He has a very obese abdomen.  Chronic leg swelling  Plan: #1 compression stockings to control his chronic leg swelling.  His diuretics are also being adjusted by Dr. Tamala Julian.  2.  Left abdominal wall erythema I added Levaquin to his antibiotic regimen to go with his Keflex at this point.  He will follow-up in 2 weeks time.  If at that time the redness has not resolved we may need to consider CT angiogram to rule out graft infection.  However I would try to avoid this if possible due to the patient's overlying renal dysfunction.  Ruta Hinds, MD [facility] Office: [phone] Pager: [phone]

## 2018-02-25 ENCOUNTER — Other Ambulatory Visit: Payer: Self-pay

## 2018-02-25 ENCOUNTER — Emergency Department (HOSPITAL_COMMUNITY): Payer: Medicare HMO

## 2018-02-25 ENCOUNTER — Inpatient Hospital Stay (HOSPITAL_COMMUNITY)
Admission: EM | Admit: 2018-02-25 | Discharge: 2018-03-03 | DRG: 291 | Disposition: A | Discharge status: Home Health | Payer: Medicare HMO | Attending: Internal Medicine | Admitting: Internal Medicine

## 2018-02-25 ENCOUNTER — Encounter (HOSPITAL_COMMUNITY): Payer: Self-pay | Admitting: Emergency Medicine

## 2018-02-25 DIAGNOSIS — Z6832 Body mass index (BMI) 32.0-32.9, adult: Secondary | ICD-10-CM

## 2018-02-25 DIAGNOSIS — E440 Moderate protein-calorie malnutrition: Secondary | ICD-10-CM

## 2018-02-25 DIAGNOSIS — E039 Hypothyroidism, unspecified: Secondary | ICD-10-CM | POA: Diagnosis present

## 2018-02-25 DIAGNOSIS — I2510 Atherosclerotic heart disease of native coronary artery without angina pectoris: Secondary | ICD-10-CM | POA: Diagnosis present

## 2018-02-25 DIAGNOSIS — J302 Other seasonal allergic rhinitis: Secondary | ICD-10-CM | POA: Diagnosis present

## 2018-02-25 DIAGNOSIS — I5043 Acute on chronic combined systolic (congestive) and diastolic (congestive) heart failure: Secondary | ICD-10-CM | POA: Diagnosis present

## 2018-02-25 DIAGNOSIS — I82812 Embolism and thrombosis of superficial veins of left lower extremities: Secondary | ICD-10-CM | POA: Diagnosis present

## 2018-02-25 DIAGNOSIS — N179 Acute kidney failure, unspecified: Secondary | ICD-10-CM | POA: Diagnosis present

## 2018-02-25 DIAGNOSIS — T8141XA Infection following a procedure, superficial incisional surgical site, initial encounter: Secondary | ICD-10-CM | POA: Diagnosis present

## 2018-02-25 DIAGNOSIS — I739 Peripheral vascular disease, unspecified: Secondary | ICD-10-CM | POA: Diagnosis not present

## 2018-02-25 DIAGNOSIS — E785 Hyperlipidemia, unspecified: Secondary | ICD-10-CM | POA: Diagnosis present

## 2018-02-25 DIAGNOSIS — I5033 Acute on chronic diastolic (congestive) heart failure: Secondary | ICD-10-CM | POA: Diagnosis not present

## 2018-02-25 DIAGNOSIS — I130 Hypertensive heart and chronic kidney disease with heart failure and stage 1 through stage 4 chronic kidney disease, or unspecified chronic kidney disease: Secondary | ICD-10-CM | POA: Diagnosis present

## 2018-02-25 DIAGNOSIS — I10 Essential (primary) hypertension: Secondary | ICD-10-CM | POA: Diagnosis present

## 2018-02-25 DIAGNOSIS — E118 Type 2 diabetes mellitus with unspecified complications: Secondary | ICD-10-CM | POA: Diagnosis present

## 2018-02-25 DIAGNOSIS — E876 Hypokalemia: Secondary | ICD-10-CM

## 2018-02-25 DIAGNOSIS — N183 Chronic kidney disease, stage 3 unspecified: Secondary | ICD-10-CM | POA: Diagnosis present

## 2018-02-25 DIAGNOSIS — Z8719 Personal history of other diseases of the digestive system: Secondary | ICD-10-CM

## 2018-02-25 DIAGNOSIS — L03314 Cellulitis of groin: Secondary | ICD-10-CM | POA: Diagnosis present

## 2018-02-25 DIAGNOSIS — Z86718 Personal history of other venous thrombosis and embolism: Secondary | ICD-10-CM | POA: Diagnosis not present

## 2018-02-25 DIAGNOSIS — L03116 Cellulitis of left lower limb: Secondary | ICD-10-CM | POA: Diagnosis present

## 2018-02-25 DIAGNOSIS — I255 Ischemic cardiomyopathy: Secondary | ICD-10-CM | POA: Diagnosis present

## 2018-02-25 DIAGNOSIS — Z87442 Personal history of urinary calculi: Secondary | ICD-10-CM | POA: Diagnosis not present

## 2018-02-25 DIAGNOSIS — E1142 Type 2 diabetes mellitus with diabetic polyneuropathy: Secondary | ICD-10-CM | POA: Diagnosis present

## 2018-02-25 DIAGNOSIS — Z87891 Personal history of nicotine dependence: Secondary | ICD-10-CM

## 2018-02-25 DIAGNOSIS — M21372 Foot drop, left foot: Secondary | ICD-10-CM | POA: Diagnosis present

## 2018-02-25 DIAGNOSIS — L03311 Cellulitis of abdominal wall: Secondary | ICD-10-CM | POA: Diagnosis present

## 2018-02-25 DIAGNOSIS — R600 Localized edema: Secondary | ICD-10-CM | POA: Diagnosis not present

## 2018-02-25 DIAGNOSIS — Z881 Allergy status to other antibiotic agents status: Secondary | ICD-10-CM

## 2018-02-25 DIAGNOSIS — I25810 Atherosclerosis of coronary artery bypass graft(s) without angina pectoris: Secondary | ICD-10-CM

## 2018-02-25 DIAGNOSIS — E1151 Type 2 diabetes mellitus with diabetic peripheral angiopathy without gangrene: Secondary | ICD-10-CM | POA: Diagnosis present

## 2018-02-25 DIAGNOSIS — L089 Local infection of the skin and subcutaneous tissue, unspecified: Secondary | ICD-10-CM

## 2018-02-25 DIAGNOSIS — T148XXA Other injury of unspecified body region, initial encounter: Secondary | ICD-10-CM

## 2018-02-25 DIAGNOSIS — Z9841 Cataract extraction status, right eye: Secondary | ICD-10-CM

## 2018-02-25 DIAGNOSIS — R0902 Hypoxemia: Secondary | ICD-10-CM | POA: Diagnosis present

## 2018-02-25 DIAGNOSIS — I4891 Unspecified atrial fibrillation: Secondary | ICD-10-CM

## 2018-02-25 DIAGNOSIS — K219 Gastro-esophageal reflux disease without esophagitis: Secondary | ICD-10-CM | POA: Diagnosis present

## 2018-02-25 DIAGNOSIS — R0682 Tachypnea, not elsewhere classified: Secondary | ICD-10-CM | POA: Diagnosis not present

## 2018-02-25 DIAGNOSIS — Z7982 Long term (current) use of aspirin: Secondary | ICD-10-CM

## 2018-02-25 DIAGNOSIS — M1990 Unspecified osteoarthritis, unspecified site: Secondary | ICD-10-CM | POA: Diagnosis present

## 2018-02-25 DIAGNOSIS — I5023 Acute on chronic systolic (congestive) heart failure: Secondary | ICD-10-CM | POA: Diagnosis not present

## 2018-02-25 DIAGNOSIS — I480 Paroxysmal atrial fibrillation: Secondary | ICD-10-CM | POA: Diagnosis present

## 2018-02-25 DIAGNOSIS — Z8249 Family history of ischemic heart disease and other diseases of the circulatory system: Secondary | ICD-10-CM

## 2018-02-25 DIAGNOSIS — E1122 Type 2 diabetes mellitus with diabetic chronic kidney disease: Secondary | ICD-10-CM | POA: Diagnosis present

## 2018-02-25 DIAGNOSIS — Z885 Allergy status to narcotic agent status: Secondary | ICD-10-CM

## 2018-02-25 DIAGNOSIS — R251 Tremor, unspecified: Secondary | ICD-10-CM | POA: Diagnosis present

## 2018-02-25 DIAGNOSIS — Z9842 Cataract extraction status, left eye: Secondary | ICD-10-CM

## 2018-02-25 DIAGNOSIS — E871 Hypo-osmolality and hyponatremia: Secondary | ICD-10-CM

## 2018-02-25 DIAGNOSIS — D631 Anemia in chronic kidney disease: Secondary | ICD-10-CM | POA: Diagnosis present

## 2018-02-25 DIAGNOSIS — I5040 Unspecified combined systolic (congestive) and diastolic (congestive) heart failure: Secondary | ICD-10-CM | POA: Diagnosis not present

## 2018-02-25 DIAGNOSIS — Z79890 Hormone replacement therapy (postmenopausal): Secondary | ICD-10-CM

## 2018-02-25 DIAGNOSIS — I509 Heart failure, unspecified: Secondary | ICD-10-CM

## 2018-02-25 LAB — CBC
HCT: 34.8 % — ABNORMAL LOW (ref 39.0–52.0)
HEMOGLOBIN: 11.3 g/dL — AB (ref 13.0–17.0)
MCH: 29.6 pg (ref 26.0–34.0)
MCHC: 32.5 g/dL (ref 30.0–36.0)
MCV: 91.1 fL (ref 78.0–100.0)
Platelets: 188 10*3/uL (ref 150–400)
RBC: 3.82 MIL/uL — AB (ref 4.22–5.81)
RDW: 16.6 % — ABNORMAL HIGH (ref 11.5–15.5)
WBC: 4.7 10*3/uL (ref 4.0–10.5)

## 2018-02-25 LAB — BASIC METABOLIC PANEL
ANION GAP: 15 (ref 5–15)
Anion gap: 14 (ref 5–15)
BUN / CREAT RATIO: 36 — AB (ref 10–24)
BUN: 86 mg/dL (ref 8–27)
BUN: 92 mg/dL — ABNORMAL HIGH (ref 8–23)
BUN: 89 mg/dL — AB (ref 8–23)
CALCIUM: 9.6 mg/dL (ref 8.9–10.3)
CHLORIDE: 89 mmol/L — AB (ref 98–111)
CO2: 29 mmol/L (ref 20–29)
CO2: 33 mmol/L — ABNORMAL HIGH (ref 22–32)
CO2: 31 mmol/L (ref 22–32)
CREATININE: 2.37 mg/dL — AB (ref 0.76–1.27)
Calcium: 9.7 mg/dL (ref 8.6–10.2)
Calcium: 9.1 mg/dL (ref 8.9–10.3)
Chloride: 86 mmol/L — ABNORMAL LOW (ref 96–106)
Chloride: 88 mmol/L — ABNORMAL LOW (ref 98–111)
Creatinine, Ser: 2.43 mg/dL — ABNORMAL HIGH (ref 0.61–1.24)
Creatinine, Ser: 2.33 mg/dL — ABNORMAL HIGH (ref 0.61–1.24)
GFR calc Af Amer: 28 mL/min — ABNORMAL LOW (ref >60)
GFR calc Af Amer: 29 mL/min — ABNORMAL LOW (ref >60)
GFR calc non Af Amer: 24 mL/min — ABNORMAL LOW (ref >60)
GFR calc non Af Amer: 25 mL/min — ABNORMAL LOW (ref >60)
GFR, EST AFRICAN AMERICAN: 29 mL/min/{1.73_m2} — AB (ref >59)
GFR, EST NON AFRICAN AMERICAN: 25 mL/min/{1.73_m2} — AB (ref >59)
GLUCOSE: 243 mg/dL — AB (ref 70–99)
Glucose, Bld: 197 mg/dL — ABNORMAL HIGH (ref 70–99)
Glucose: 152 mg/dL — ABNORMAL HIGH (ref 65–99)
POTASSIUM: 3.3 mmol/L — AB (ref 3.5–5.2)
POTASSIUM: 3 mmol/L — AB (ref 3.5–5.1)
Potassium: 2.6 mmol/L — CL (ref 3.5–5.1)
SODIUM: 136 mmol/L (ref 134–144)
SODIUM: 134 mmol/L — AB (ref 135–145)
Sodium: 136 mmol/L (ref 135–145)

## 2018-02-25 LAB — I-STAT TROPONIN, ED: TROPONIN I, POC: 0.01 ng/mL (ref 0.00–0.08)

## 2018-02-25 LAB — PRO B NATRIURETIC PEPTIDE: NT-Pro BNP: 17251 pg/mL — ABNORMAL HIGH (ref 0–486)

## 2018-02-25 LAB — GLUCOSE, CAPILLARY: GLUCOSE-CAPILLARY: 225 mg/dL — AB (ref 70–99)

## 2018-02-25 LAB — BRAIN NATRIURETIC PEPTIDE: B Natriuretic Peptide: 1809.7 pg/mL — ABNORMAL HIGH (ref 0.0–100.0)

## 2018-02-25 MED ORDER — ASPIRIN EC 81 MG PO TBEC
81.0000 mg | DELAYED_RELEASE_TABLET | Freq: Every day | ORAL | Status: DC
  Administered 2018-02-26 – 2018-03-03 (×6): 81 mg via ORAL
  Filled 2018-02-25 (×6): qty 1

## 2018-02-25 MED ORDER — LEVOTHYROXINE SODIUM 100 MCG PO TABS
100.0000 ug | ORAL_TABLET | Freq: Every day | ORAL | Status: DC
  Administered 2018-02-26 – 2018-03-03 (×6): 100 ug via ORAL
  Filled 2018-02-25 (×6): qty 1

## 2018-02-25 MED ORDER — POTASSIUM CHLORIDE 10 MEQ/100ML IV SOLN
10.0000 meq | INTRAVENOUS | Status: AC
  Administered 2018-02-25: 10 meq via INTRAVENOUS
  Filled 2018-02-25: qty 100

## 2018-02-25 MED ORDER — FUROSEMIDE 10 MG/ML IJ SOLN
120.0000 mg | Freq: Two times a day (BID) | INTRAVENOUS | Status: DC
  Administered 2018-02-25 – 2018-03-02 (×10): 120 mg via INTRAVENOUS
  Filled 2018-02-25: qty 10
  Filled 2018-02-25 (×2): qty 12
  Filled 2018-02-25 (×4): qty 10
  Filled 2018-02-25: qty 12
  Filled 2018-02-25 (×2): qty 10
  Filled 2018-02-25: qty 12

## 2018-02-25 MED ORDER — POTASSIUM CHLORIDE CRYS ER 20 MEQ PO TBCR
40.0000 meq | EXTENDED_RELEASE_TABLET | ORAL | Status: AC
  Administered 2018-02-25 – 2018-02-26 (×3): 40 meq via ORAL
  Filled 2018-02-25 (×3): qty 2

## 2018-02-25 MED ORDER — ATORVASTATIN CALCIUM 20 MG PO TABS
20.0000 mg | ORAL_TABLET | Freq: Every evening | ORAL | Status: DC
  Administered 2018-02-26 – 2018-03-02 (×5): 20 mg via ORAL
  Filled 2018-02-25 (×5): qty 1

## 2018-02-25 MED ORDER — ENSURE ENLIVE PO LIQD: 237.0000 mL | Freq: Two times a day (BID) | ORAL | Status: DC

## 2018-02-25 MED ORDER — CARVEDILOL 12.5 MG PO TABS
12.5000 mg | ORAL_TABLET | Freq: Two times a day (BID) | ORAL | Status: DC
  Administered 2018-02-25 – 2018-02-26 (×2): 12.5 mg via ORAL
  Filled 2018-02-25 (×3): qty 1

## 2018-02-25 MED ORDER — VITAMIN C 500 MG/5ML PO SYRP
500.0000 mg | ORAL_SOLUTION | Freq: Two times a day (BID) | ORAL | Status: DC
  Administered 2018-02-25 – 2018-03-03 (×12): 500 mg via ORAL
  Filled 2018-02-25 (×12): qty 5

## 2018-02-25 MED ORDER — POTASSIUM CHLORIDE CRYS ER 20 MEQ PO TBCR
20.0000 meq | EXTENDED_RELEASE_TABLET | Freq: Every day | ORAL | Status: DC
  Administered 2018-02-26: 20 meq via ORAL
  Filled 2018-02-25: qty 1

## 2018-02-25 MED ORDER — VANCOMYCIN HCL 10 G IV SOLR
2000.0000 mg | Freq: Once | INTRAVENOUS | Status: AC
  Administered 2018-02-25: 2000 mg via INTRAVENOUS
  Filled 2018-02-25: qty 2000

## 2018-02-25 MED ORDER — ZINC GLUCONATE 50 MG PO TABS: 50.0000 mg | ORAL_TABLET | Freq: Every day | ORAL | Status: DC

## 2018-02-25 MED ORDER — FENOFIBRATE 54 MG PO TABS
54.0000 mg | ORAL_TABLET | Freq: Every day | ORAL | Status: DC
  Administered 2018-02-26 – 2018-03-03 (×6): 54 mg via ORAL
  Filled 2018-02-25 (×6): qty 1

## 2018-02-25 MED ORDER — TAMSULOSIN HCL 0.4 MG PO CAPS
0.4000 mg | ORAL_CAPSULE | ORAL | Status: DC
  Administered 2018-02-27 – 2018-03-03 (×3): 0.4 mg via ORAL
  Filled 2018-02-25 (×4): qty 1

## 2018-02-25 MED ORDER — DIPHENHYDRAMINE HCL 50 MG/ML IJ SOLN
12.5000 mg | Freq: Once | INTRAMUSCULAR | Status: AC
  Administered 2018-02-25: 12.5 mg via INTRAVENOUS
  Filled 2018-02-25: qty 1

## 2018-02-25 MED ORDER — CYANOCOBALAMIN 500 MCG PO TABS
500.0000 ug | ORAL_TABLET | Freq: Every day | ORAL | Status: DC
  Administered 2018-02-26 – 2018-03-03 (×6): 500 ug via ORAL
  Filled 2018-02-25 (×6): qty 1

## 2018-02-25 MED ORDER — SALINE SPRAY 0.65 % NA SOLN
1.0000 | Freq: Two times a day (BID) | NASAL | Status: DC | PRN
  Administered 2018-02-26: 1 via NASAL
  Filled 2018-02-25: qty 44

## 2018-02-25 MED ORDER — ACETAMINOPHEN 325 MG PO TABS
650.0000 mg | ORAL_TABLET | Freq: Four times a day (QID) | ORAL | Status: DC | PRN
  Administered 2018-02-26 – 2018-03-02 (×3): 650 mg via ORAL
  Filled 2018-02-25 (×5): qty 2

## 2018-02-25 MED ORDER — TEMAZEPAM 15 MG PO CAPS
15.0000 mg | ORAL_CAPSULE | Freq: Every evening | ORAL | Status: DC | PRN
  Administered 2018-02-27 – 2018-03-01 (×2): 15 mg via ORAL
  Filled 2018-02-25 (×2): qty 1

## 2018-02-25 MED ORDER — HEPARIN SODIUM (PORCINE) 5000 UNIT/ML IJ SOLN
5000.0000 [IU] | Freq: Three times a day (TID) | INTRAMUSCULAR | Status: DC
  Administered 2018-02-25 – 2018-02-27 (×5): 5000 [IU] via SUBCUTANEOUS
  Filled 2018-02-25 (×5): qty 1

## 2018-02-25 MED ORDER — INSULIN ASPART 100 UNIT/ML ~~LOC~~ SOLN
0.0000 [IU] | Freq: Three times a day (TID) | SUBCUTANEOUS | Status: DC
  Administered 2018-02-26: 2 [IU] via SUBCUTANEOUS
  Administered 2018-02-26: 1 [IU] via SUBCUTANEOUS
  Administered 2018-02-26 – 2018-02-28 (×5): 2 [IU] via SUBCUTANEOUS
  Administered 2018-02-28: 1 [IU] via SUBCUTANEOUS
  Administered 2018-03-01: 2 [IU] via SUBCUTANEOUS
  Administered 2018-03-01 – 2018-03-02 (×3): 1 [IU] via SUBCUTANEOUS
  Administered 2018-03-02: 2 [IU] via SUBCUTANEOUS

## 2018-02-25 MED ORDER — FUROSEMIDE 10 MG/ML IJ SOLN: 80.0000 mg | Freq: Two times a day (BID) | INTRAMUSCULAR | Status: DC

## 2018-02-25 MED ORDER — MAGNESIUM SULFATE IN D5W 1-5 GM/100ML-% IV SOLN
1.0000 g | Freq: Once | INTRAVENOUS | Status: AC
  Administered 2018-02-25: 1 g via INTRAVENOUS
  Filled 2018-02-25: qty 100

## 2018-02-25 MED ORDER — POLYVINYL ALCOHOL 1.4 % OP SOLN
1.0000 [drp] | Freq: Two times a day (BID) | OPHTHALMIC | Status: DC | PRN
  Filled 2018-02-25: qty 15

## 2018-02-25 MED ORDER — ADULT MULTIVITAMIN W/MINERALS CH
1.0000 | ORAL_TABLET | Freq: Every day | ORAL | Status: DC
  Administered 2018-02-26 – 2018-03-03 (×6): 1 via ORAL
  Filled 2018-02-25 (×6): qty 1

## 2018-02-25 MED ORDER — GABAPENTIN 300 MG PO CAPS
300.0000 mg | ORAL_CAPSULE | Freq: Two times a day (BID) | ORAL | Status: DC
  Administered 2018-02-25 – 2018-03-03 (×12): 300 mg via ORAL
  Filled 2018-02-25 (×12): qty 1

## 2018-02-25 NOTE — ED Provider Notes (Addendum)
[place] [facility] EMERGENCY DEPARTMENT Provider Note   CSN: [id number] Arrival date & time: 02/25/18  1041     History   Chief Complaint Chief Complaint  Patient presents with  . Leg Swelling    HPI [name] is a 77 y.o. male.  77 year old male with prior medical history as detailed below presents for evaluation of increasing lower extremity edema and worsening renal function.  Patient is followed by Dr. Tamala Julian for cardiology.  He was seen yesterday for same complaints.  Labs today suggest worsening renal function.  He was instructed to come to the hospital for admission.  He complains of baseline shortness of breath that is worse with exertion.  He has had to sleep upright for the last several weeks in a recliner.  He denies fever.  He denies chest pain.  He denies shortness of breath at rest sitting upright.  Of note, patient is also recently post a femoropopliteal bypass with Dr. Ruta Hinds.  He was also seen yesterday by Dr. Oneida Alar for a wound check.  He has been taking Keflex and was started on Levaquin yesterday for possible wound infection of the left groin.  The history is provided by the patient, medical records and the spouse.  Shortness of Breath  This is a new problem. The current episode started more than 1 week ago. The problem has not changed since onset.Pertinent negatives include no fever and no chest pain.    Past Medical History:  Diagnosis Date  . Arthritis   . Atherosclerotic heart disease   . Back pain   . Bruises easily    takes Pletal daily and ASA 325mg  daily  . Cataract immature    right  . Chest pain   . Colon polyps   . Complication of anesthesia    hard to wake up with bypass surgery  . Cyst    on perineum;takes Doxycycline daily  . Diabetes mellitus    takes Glipizide,Metformin,and Actos daily  . Diarrhea   . Dyspnea    when lying flat  . Edema    right leg knee down swollen since fall 3 wks ago  . Foot drop, left     . GERD (gastroesophageal reflux disease)    Rolaids as needed-occ reflux  . Hemorrhoids   . History of kidney stones    at age 51   . Hyperlipidemia    takes Tricor and Lipitor daily  . Hypertension    takes Altace,Amlodipine,and Nadolol daily  . Hypothyroidism   . Kidney stone    35+yrs ago  . Muscle pain   . Nasal polyps    hx of  . Peripheral edema    wears knee length hose-told by podiatrist to wear these  . Peripheral neuropathy    takes Gabapentin daily  . Pneumonia    as a child  . PVD (peripheral vascular disease) ([place])   . Renal insufficiency   . Seasonal allergies    takes Mucinex and Zyrtec prn  . Urinary frequency    urgency/takes Vesicare daily    Patient Active Problem List   Diagnosis Date Noted  . Volume overload 11/10/2017  . Surgical wound infection 11/04/2017  . Polyneuropathy associated with underlying disease ([place]) 08/16/2017  . Essential hypertension 02/06/2014  . Acute on chronic combined systolic and diastolic CHF (congestive heart failure) ([place]) 02/06/2014  . Chronic kidney disease, stage III (moderate) ([place]) 02/06/2014  . Aftercare following surgery of the circulatory system, [place] 01/25/2014  .  Carotid stenosis 06/08/2013  . Stricture of artery ([place]) 04/14/2012  . Peripheral vascular disease, unspecified ([place]) 10/08/2011  . DVT of upper extremity (deep vein thrombosis), left 09/29/2011  . S/P CABG (coronary artery bypass graft) 09/29/2011  . H/O carotid endarterectomy 09/29/2011  . Occlusion and stenosis of carotid artery without mention of cerebral infarction 06/11/2011  . Diabetes mellitus type 2 with complications ([place]) 03/55/9741    Past Surgical History:  Procedure Laterality Date  . Aortobifemoral bypass    . APPLICATION OF WOUND VAC Left 10/25/2017   Procedure: Wound vac placement to left groin.;  Surgeon: Elam Dutch, MD;  Location: [facility] OR;  Service: Vascular;  Laterality: Left;  . APPLICATION OF WOUND VAC Left 11/10/2017    Procedure: APPLICATION OF WOUND VAC;  Surgeon: Elam Dutch, MD;  Location: [place];  Service: Vascular;  Laterality: Left;  . CARDIAC CATHETERIZATION  most recent 05/2011   total of 4  . CAROTID ANGIOGRAM N/A 06/05/2011   Procedure: CAROTID ANGIOGRAM;  Surgeon: Elam Dutch, MD;  Location: [facility] CATH LAB;  Service: Cardiovascular;  Laterality: N/A;  . CAROTID ENDARTERECTOMY  04/08/2011   left CEA  . cataract surgery Bilateral    left  . COLONOSCOPY    . COLONOSCOPY WITH PROPOFOL N/A 08/22/2013   Procedure: COLONOSCOPY WITH PROPOFOL;  Surgeon: Garlan Fair, MD;  Location: [facility] ENDOSCOPY;  Service: Endoscopy;  Laterality: N/A;  . CORONARY ARTERY BYPASS GRAFT  09/09/2011   Procedure: CORONARY ARTERY BYPASS GRAFTING (CABG);  Surgeon: Gaye Pollack, MD;  Location: [place];  Service: Open Heart Surgery;  Laterality: N/A;  Coronary artery bypass grafting  x three with Right saphenous vein harvested endoscopically and left internal mammary artery  . ENDARTERECTOMY  04/08/2011   Procedure: ENDARTERECTOMY CAROTID;  Surgeon: Elam Dutch, MD;  Location[facility] OR;  Service: Vascular;  Laterality: Left;  with patch angioplasty  . ENDARTERECTOMY  09/09/2011   Procedure: ENDARTERECTOMY CAROTID;  Surgeon: Elam Dutch, MD;  Location: [facility] OR;  Service: Vascular;  Laterality: Right;  with patch angioplasty  . FEMORAL-POPLITEAL BYPASS GRAFT Left 10/25/2017   Procedure: Left Common Femoral Endarterectomy and perfundaplasty, Left BYPASS GRAFT FEMORAL-BELOW KNEE POPLITEAL ARTERY.;  Surgeon: Elam Dutch, MD;  Location: [place];  Service: Vascular;  Laterality: Left;  . GROIN DEBRIDEMENT Left 11/10/2017   Procedure: GROIN DEBRIDEMENT;  Surgeon: Elam Dutch, MD;  Location: [place];  Service: Vascular;  Laterality: Left;  . LEFT HEART CATHETERIZATION WITH CORONARY ANGIOGRAM N/A 07/09/2011   Procedure: LEFT HEART CATHETERIZATION WITH CORONARY ANGIOGRAM;  Surgeon: Sinclair Grooms, MD;  Location: [facility] CATH LAB;   Service: Cardiovascular;  Laterality: N/A;  . LOWER EXTREMITY ANGIOGRAPHY N/A 10/11/2017   Procedure: LOWER EXTREMITY ANGIOGRAPHY;  Surgeon: Waynetta Sandy, MD;  Location: [place] CV LAB;  Service: Cardiovascular;  Laterality: N/A;  . Reimplantation of inferior mesenteric artery    . Repair of infrarenal abdominal aortic aneurysm    . SHOULDER ARTHROSCOPY W/ ROTATOR CUFF REPAIR     right and left-open procedures  . TONSILLECTOMY     at age 24  . TRIGGER FINGER RELEASE Right 06/28/2014   Procedure: RIGHT LONG TRIGGER RELEASE ;  Surgeon: Leanora Cover, MD;  Location: [place];  Service: Orthopedics;  Laterality: Right;  . wisdom teeth extracted     as a teenager  . WOUND DEBRIDEMENT Left 11/05/2017   Procedure: DEBRIDEMENT WOUND LEFT GROIN with wound vac placement;  Surgeon: Oneida Alar,  Jessy Oto, MD;  Location: [facility] OR;  Service: Vascular;  Laterality: Left;        Home Medications    Prior to Admission medications   Medication Sig Start Date End Date Taking? Authorizing Provider  Ascorbic Acid (VITAMIN C PO) Take 1 tablet by mouth 2 (two) times daily.    [provider]  aspirin EC 81 MG tablet Take 1 tablet (81 mg total) by mouth daily. 02/03/18   Belva Crome, MD  atorvastatin (LIPITOR) 20 MG tablet Take 20 mg by mouth every evening.     [provider]  carvedilol (COREG) 12.5 MG tablet Take 1 tablet (12.5 mg total) by mouth 2 (two) times daily. Patient not taking: Reported on 02/24/2018 02/16/18 05/17/18  Imogene Burn, PA-C  cephALEXin (KEFLEX) 500 MG capsule Take 1 capsule (500 mg total) by mouth every 12 (twelve) hours. 01/27/18   Elam Dutch, MD  cetirizine (ZYRTEC ALLERGY) 10 MG tablet Take 1 tablet (10 mg total) by mouth daily as needed for allergies. 11/01/17   Rhyne, Hulen Shouts, PA-C  Cyanocobalamin (VITAMIN B 12 PO) Take 500 mcg by mouth daily.     [provider]  erythromycin ophthalmic ointment USE A SMALL AMOUNT IN  THE LEFT EYE DAILY AS NEEDED FOR IRRITATION 12/31/14   [provider]  fenofibrate (TRICOR) 145 MG tablet Take 145 mg by mouth every morning.    [provider]  gabapentin (NEURONTIN) 300 MG capsule Take 300 mg by mouth 2 (two) times daily.    [provider]  levofloxacin (LEVAQUIN) 500 MG tablet Take 0.5 tablets (250 mg total) by mouth daily. 02/24/18   Elam Dutch, MD  levothyroxine (SYNTHROID, LEVOTHROID) 100 MCG tablet Take 100 mcg by mouth daily before breakfast.  01/07/18   [provider]  loperamide (IMODIUM A-D) 2 MG tablet Take 2 mg by mouth daily as needed for diarrhea or loose stools.     [provider]  methylcellulose (ARTIFICIAL TEARS) 1 % ophthalmic solution Place 1 drop into both eyes 2 (two) times daily as needed (dry eyes).    [provider]  metolazone (ZAROXOLYN) 5 MG tablet Take 5 mg by mouth every other day. Take every other day until your appointment Wednesday 9/25    [provider]  Multiple Vitamin (MULTIVITAMIN WITH MINERALS) TABS tablet Take 1 tablet by mouth daily. 12/03/17   Rhyne, Hulen Shouts, PA-C  nitroGLYCERIN (NITROSTAT) 0.4 MG SL tablet PLACE 1 TABLET UNDER TONGUE EVERY 5 MINUTES AS NEEDED FOR CHEST PAIN. 09/17/15   Belva Crome, MD  oxymetazoline (VICKS SINEX) 0.05 % nasal spray Place 1 spray into both nostrils 2 (two) times daily as needed for congestion.    [provider]  pioglitazone (ACTOS) 30 MG tablet Take 1 tablet (30 mg total) by mouth daily with lunch. 12/03/17   Rhyne, Hulen Shouts, PA-C  potassium chloride SA (K-DUR,KLOR-CON) 20 MEQ tablet Take 60 mEq by mouth every other day.     [provider]  pseudoephedrine-guaifenesin (MUCINEX D) 60-600 MG per tablet Take 1 tablet by mouth every 12 (twelve) hours as needed for congestion.     [provider]  senna-docusate (SENOKOT-S) 8.6-50 MG tablet Take 1 tablet by mouth at bedtime as needed for mild constipation.  12/02/17   Rhyne, Hulen Shouts, PA-C  sodium chloride (OCEAN) 0.65 % SOLN nasal spray Place 1 spray into both nostrils 2 (two) times daily as needed for congestion.  [provider]  tamsulosin (FLOMAX) 0.4 MG CAPS capsule Take 0.4 mg by mouth every other day.    [provider]  temazepam (RESTORIL) 15 MG capsule Take 15 mg by mouth at bedtime as needed for sleep.     [provider]  torsemide (DEMADEX) 20 MG tablet Take 3 tablets (60 mg total) by mouth 2 (two) times daily. 02/03/18   Belva Crome, MD  UNABLE TO FIND Apply 1 application topically every 6 (six) hours as needed (pain). Med Name: *Ibuprofen 15% Baclofen 1% Lidocaine 10% Gabapentin 3% Topical Cream** To wrist    [provider]  zinc gluconate 50 MG tablet Take 50 mg by mouth daily.    [provider]    Family History Family History  Problem Relation Age of Onset  . Heart disease Mother   . Heart disease Father   . Anesthesia problems Neg Hx   . Hypotension Neg Hx   . Malignant hyperthermia Neg Hx   . Pseudochol deficiency Neg Hx     Social History Social History   Tobacco Use  . Smoking status: Former Smoker    Years: 40.00    Types: Cigarettes    Last attempt to quit: 10/27/2005    Years since quitting: 12.3  . Smokeless tobacco: Never Used  Substance Use Topics  . Alcohol use: Yes    Alcohol/week: 2.0 - 3.0 standard drinks    Types: 2 - 3 Cans of beer per week    Comment: occasional- weekly  . Drug use: No     Allergies   Amoxicillin-pot clavulanate and Morphine and related   Review of Systems Review of Systems  Constitutional: Negative for fever.  Respiratory: Positive for shortness of breath.   Cardiovascular: Negative for chest pain.  All other systems reviewed and are negative.    Physical Exam Updated Vital Signs BP (!) 119/101   Pulse (!) 54   Temp 97.7 F (36.5 C) (Oral)   Resp 13   Ht 5\' 11"  (1.803 m)   Wt 113.4 kg   SpO2 95%   BMI  34.87 kg/m   Physical Exam  Constitutional: He is oriented to person, place, and time. He appears well-developed and well-nourished. No distress.  HENT:  Head: Normocephalic and atraumatic.  Mouth/Throat: Oropharynx is clear and moist.  Eyes: Pupils are equal, round, and reactive to light. Conjunctivae and EOM are normal.  Neck: Normal range of motion. Neck supple.  Cardiovascular: Normal rate, regular rhythm and normal heart sounds.  Pulmonary/Chest: Effort normal and breath sounds normal. No respiratory distress.  Abdominal: Soft. He exhibits no distension. There is no tenderness.  Musculoskeletal: Normal range of motion. He exhibits edema. He exhibits no deformity.  3-4 plus edema to BLE   Neurological: He is alert and oriented to person, place, and time.  Skin: Skin is warm and dry.  Mild erythema surround healing wound in left groin   Psychiatric: He has a normal mood and affect.  Nursing note and vitals reviewed.    ED Treatments / Results  Labs (all labs ordered are listed, but only abnormal results are displayed) Labs Reviewed  BASIC METABOLIC PANEL - Abnormal; Notable for the following components:      Result Value   Potassium 2.6 (*)    Chloride 88 (*)    CO2 33 (*)    Glucose, Bld 197 (*)    BUN 92 (*)    Creatinine, Ser 2.43 (*)    GFR  calc non Af Amer 24 (*)    GFR calc Af Amer 28 (*)    All other components within normal limits  CBC - Abnormal; Notable for the following components:   RBC 3.82 (*)    Hemoglobin 11.3 (*)    HCT 34.8 (*)    RDW 16.6 (*)    All other components within normal limits  CULTURE, BLOOD (ROUTINE X 2)  CULTURE, BLOOD (ROUTINE X 2)  BRAIN NATRIURETIC PEPTIDE  I-STAT TROPONIN, ED    EKG EKG Interpretation  Date/Time:  Friday February 25 2018 11:27:03 EDT Ventricular Rate:  58 PR Interval:  194 QRS Duration: 186 QT Interval:  532 QTC Calculation: 522 R Axis:   30 Text Interpretation:  Sinus bradycardia Indeterminate axis  Right bundle branch block Abnormal ECG Confirmed by Dene Gentry [phone]) on 02/25/2018 12:20:02 PM   Radiology Dg Chest 2 View  Result Date: 02/25/2018 CLINICAL DATA:  Swelling both extremities.  Prior vascular surgery. EXAM: CHEST - 2 VIEW COMPARISON:  10/11/2017. FINDINGS: Prior CABG. Cardiomegaly with mild bilateral interstitial prominence consistent CHF. No focal infiltrate. No pneumothorax. IMPRESSION: Prior CABG. Cardiomegaly with diffuse bilateral interstitial prominence consistent with CHF. Electronically Signed   By: Marcello Moores  Register   On: 02/25/2018 11:41    Procedures Procedures (including critical care time)  Medications Ordered in ED Medications  vancomycin (VANCOCIN) 2,000 mg in sodium chloride 0.9 % 500 mL IVPB (has no administration in time range)     Initial Impression / Assessment and Plan / ED Course  I have reviewed the triage vital signs and the nursing notes.  Pertinent labs & imaging results that were available during my care of the patient were reviewed by me and considered in my medical decision making (see chart for details).     MDM  Screen complete  She is presenting for evaluation of increasing lower extremity edema, shortness of breath, and worsening renal function.  Patient is well-known to cardiology.  Cardiology master Wannetta Sender) contacted and they will evaluate the patient for admission to their service.  Nursing staff with Dr. Oneida Alar is aware that the patient is going to be in the hospital. They will notify Dr. Oneida Alar that the patient requires consultation.   One dose of Vancomycin given for possible wound infection (left groin).   Hospitalist service is aware of case and will evaluate for admission.  Final Clinical Impressions(s) / ED Diagnoses   Final diagnoses:  Leg edema  Hypokalemia  Wound infection    ED Discharge Orders    None       Valarie Merino, MD 02/25/18 1311    Valarie Merino, MD 02/25/18 1622

## 2018-02-25 NOTE — ED Notes (Signed)
Vancomycin started at a lower rate that was verified by pharmacy. MD verified as well

## 2018-02-25 NOTE — Consult Note (Signed)
[facility]  Subjective  - Pt in ER for leg swelling and fluid overload   Objective (!) 162/60 63 97.7 F (36.5 C) (Oral) 16 94%  Intake/Output Summary (Last 24 hours) at 02/25/2018 1502 Last data filed at 02/25/2018 1401 Gross per 24 hour  Intake 41.81 ml  Output -  Net 41.81 ml   Left groin unchanged from yesterday persistant redness on left abdominal wall not really changed     Assessment/Planning: Prior left femoral endarterectomy left fem pop in June slow healing groin incision Remote placement of aortobifem  Details similar to my note yesterday  Will follow  Agree with IV antibiotics Will consider CTA if rash does not resolve and renal function improves  Ruta Hinds 02/25/2018 3:02 PM --  Laboratory Lab Results: Recent Labs    02/25/18 1114  WBC 4.7  HGB 11.3*  HCT 34.8*  PLT 188   BMET Recent Labs    02/24/18 1243 02/25/18 1114  NA 136 136  K 3.3* 2.6*  CL 86* 88*  CO2 29 33*  GLUCOSE 152* 197*  BUN 86* 92*  CREATININE 2.37* 2.43*  CALCIUM 9.7 9.6    COAG Lab Results  Component Value Date   INR 1.27 11/10/2017   INR 1.20 11/04/2017   INR 1.13 10/21/2017   No results found for: PTT

## 2018-02-25 NOTE — ED Notes (Signed)
Pt denies itching with current rate of vancomycin infusing. Pharmacist aware.

## 2018-02-25 NOTE — ED Notes (Signed)
[facility] [place] called and notified pt is ready for transport

## 2018-02-25 NOTE — ED Triage Notes (Signed)
States bilateral lower extremity swelling increasing overtime. Seen doctor one day ago and blood work completed. Today received a phone call and told to come to the ED for evaluation for abnormal labs. Wife at bedside stated kidney function was high and history of CHF.

## 2018-02-25 NOTE — ED Notes (Signed)
Pt expressed concern about his groin incision. Looked at the incision site and it is noticeably red and warm. MD notified

## 2018-02-25 NOTE — Consult Note (Addendum)
Cardiology Consultation:   Patient ID: [name] MRN: [id number]; DOB: 02-02-1941  Admit date: 02/25/2018 Date of Consult: 02/25/2018  Primary Care Provider: Lavone Orn, MD Primary Cardiologist: Sinclair Grooms, MD  Primary Electrophysiologist:  None    Patient Profile:   [name] is a 77 y.o. male with a hx of CAD s/p CABG in 0086, chronic diastolic heart failure, bilateral carotid artery disease s/p endarterectomy, PAD with claudication s/p aortobifemoral bypass, HTN, CKD, and HLD who is being seen today for the evaluation of lower extremity swelling and worsening renal function at the request of Dr. Francia Greaves.  History of Present Illness:   [name] underwent aortobifem bypass in July 7619 complicated by groin infection and prolonged rehab stay. He was discharged from rehab on 12/30/17 and experienced significant orthopnea and leg swelling (he generally slept in a hospital bed that was raised during rehab). He was recently seen by our office by Dr. Tamala Julian 02/03/18 with worsening bilateral lower extremity edema. BNP was > 10k with creatinine 1.95. Lasix 80 mg BID was switched to torsemide 60 mg BID. Wegith was 256 lbs. Echo on 02/09/18 with LVEF of 40-45% and grade 2 DD. Pt was seen in follow up on 02/09/18 with worsening edema and 9 lb weight gain. BNP was > 10k. Metolazone was added at that visit. Pt was seen in close follow up on 02/16/18. He lost 10 lbs on metolazone, but still with significant edema. Labs with worsening renal function. Pt questioned cellulitis, but had been on keflex for prolonged period of time for groin infection. He was referred to heart failure clinic. Lopressor changed to coreg, metolazone and demadex continued. Unfortunately, follow up labs revealed creatinine increased to 2.37 with BNP > 17k. It was felt that his CHF could no longer be managed in the outpatient setting and he was advised to present to the ER.   Pt presented to [facility]. On arrival, sCr  2.43, K is 2.6, Cl 88. CXR with cardiomegaly and diffuse bilateral interstitial prominence consistent with CHF. Pt had dietary indiscretion over the weekend when at a family gathering and was instructed to take metolazone the next day by Dr. Tamala Julian. He was seen by Dr. Oneida Alar vascular surgery yesterday and levaquin was added to his regimen for a left abdominal wall erythema; notes to consider follow up CT angiogram to rule out graft infection.   On my interview, patient is visibly short of breath and dropping sats into the 80s. I applied [place] O2 and he recovered to 98%. Sounds like patient has been compliant on medications but has not been euvolemic since prior to his August discharge from rehab. He reports DOE, orthopnea, and lower extremity swelling. He denies chest pain, palpitations, and syncope.    Past Medical History:  Diagnosis Date  . Arthritis   . Atherosclerotic heart disease   . Back pain   . Bruises easily    takes Pletal daily and ASA 325mg  daily  . Cataract immature    right  . Chest pain   . Colon polyps   . Complication of anesthesia    hard to wake up with bypass surgery  . Cyst    on perineum;takes Doxycycline daily  . Diabetes mellitus    takes Glipizide,Metformin,and Actos daily  . Diarrhea   . Dyspnea    when lying flat  . Edema    right leg knee down swollen since fall 3 wks ago  . Foot drop, left   .  GERD (gastroesophageal reflux disease)    Rolaids as needed-occ reflux  . Hemorrhoids   . History of kidney stones    at age 55   . Hyperlipidemia    takes Tricor and Lipitor daily  . Hypertension    takes Altace,Amlodipine,and Nadolol daily  . Hypothyroidism   . Kidney stone    35+yrs ago  . Muscle pain   . Nasal polyps    hx of  . Peripheral edema    wears knee length hose-told by podiatrist to wear these  . Peripheral neuropathy    takes Gabapentin daily  . Pneumonia    as a child  . PVD (peripheral vascular disease) ([place])   . Renal insufficiency     . Seasonal allergies    takes Mucinex and Zyrtec prn  . Urinary frequency    urgency/takes Vesicare daily    Past Surgical History:  Procedure Laterality Date  . Aortobifemoral bypass    . APPLICATION OF WOUND VAC Left 10/25/2017   Procedure: Wound vac placement to left groin.;  Surgeon: Elam Dutch, MD;  Location: [facility] OR;  Service: Vascular;  Laterality: Left;  . APPLICATION OF WOUND VAC Left 11/10/2017   Procedure: APPLICATION OF WOUND VAC;  Surgeon: Elam Dutch, MD;  Location: [place];  Service: Vascular;  Laterality: Left;  . CARDIAC CATHETERIZATION  most recent 05/2011   total of 4  . CAROTID ANGIOGRAM N/A 06/05/2011   Procedure: CAROTID ANGIOGRAM;  Surgeon: Elam Dutch, MD;  Location: [facility] CATH LAB;  Service: Cardiovascular;  Laterality: N/A;  . CAROTID ENDARTERECTOMY  04/08/2011   left CEA  . cataract surgery Bilateral    left  . COLONOSCOPY    . COLONOSCOPY WITH PROPOFOL N/A 08/22/2013   Procedure: COLONOSCOPY WITH PROPOFOL;  Surgeon: Garlan Fair, MD;  Location: [facility] ENDOSCOPY;  Service: Endoscopy;  Laterality: N/A;  . CORONARY ARTERY BYPASS GRAFT  09/09/2011   Procedure: CORONARY ARTERY BYPASS GRAFTING (CABG);  Surgeon: Gaye Pollack, MD;  Location: [place];  Service: Open Heart Surgery;  Laterality: N/A;  Coronary artery bypass grafting  x three with Right saphenous vein harvested endoscopically and left internal mammary artery  . ENDARTERECTOMY  04/08/2011   Procedure: ENDARTERECTOMY CAROTID;  Surgeon: Elam Dutch, MD;  Location: [facility] OR;  Service: Vascular;  Laterality: Left;  with patch angioplasty  . ENDARTERECTOMY  09/09/2011   Procedure: ENDARTERECTOMY CAROTID;  Surgeon: Elam Dutch, MD;  Location: [facility] OR;  Service: Vascular;  Laterality: Right;  with patch angioplasty  . FEMORAL-POPLITEAL BYPASS GRAFT Left 10/25/2017   Procedure: Left Common Femoral Endarterectomy and perfundaplasty, Left BYPASS GRAFT FEMORAL-BELOW KNEE POPLITEAL ARTERY.;  Surgeon: Elam Dutch, MD;  Location: [place];  Service: Vascular;  Laterality: Left;  . GROIN DEBRIDEMENT Left 11/10/2017   Procedure: GROIN DEBRIDEMENT;  Surgeon: Elam Dutch, MD;  Location: [place];  Service: Vascular;  Laterality: Left;  . LEFT HEART CATHETERIZATION WITH CORONARY ANGIOGRAM N/A 07/09/2011   Procedure: LEFT HEART CATHETERIZATION WITH CORONARY ANGIOGRAM;  Surgeon: Sinclair Grooms, MD;  Location: [facility] CATH LAB;  Service: Cardiovascular;  Laterality: N/A;  . LOWER EXTREMITY ANGIOGRAPHY N/A 10/11/2017   Procedure: LOWER EXTREMITY ANGIOGRAPHY;  Surgeon: Waynetta Sandy, MD;  Location: [place] CV LAB;  Service: Cardiovascular;  Laterality: N/A;  . Reimplantation of inferior mesenteric artery    . Repair of infrarenal abdominal aortic aneurysm    . SHOULDER ARTHROSCOPY W/ ROTATOR CUFF REPAIR  right and left-open procedures  . TONSILLECTOMY     at age 89  . TRIGGER FINGER RELEASE Right 06/28/2014   Procedure: RIGHT LONG TRIGGER RELEASE ;  Surgeon: Leanora Cover, MD;  Location: [place];  Service: Orthopedics;  Laterality: Right;  . wisdom teeth extracted     as a teenager  . WOUND DEBRIDEMENT Left 11/05/2017   Procedure: DEBRIDEMENT WOUND LEFT GROIN with wound vac placement;  Surgeon: Elam Dutch, MD;  Location: [facility] OR;  Service: Vascular;  Laterality: Left;     Home Medications:  Prior to Admission medications   Medication Sig Start Date End Date Taking? Authorizing Provider  Ascorbic Acid (VITAMIN C PO) Take 1 tablet by mouth 2 (two) times daily.   Yes [provider]  aspirin EC 81 MG tablet Take 1 tablet (81 mg total) by mouth daily. 02/03/18  Yes Belva Crome, MD  atorvastatin (LIPITOR) 20 MG tablet Take 20 mg by mouth every evening.    Yes [provider]  carvedilol (COREG) 12.5 MG tablet Take 1 tablet (12.5 mg total) by mouth 2 (two) times daily. 02/16/18 05/17/18 Yes Imogene Burn, PA-C  cephALEXin (KEFLEX) 500 MG capsule Take 1  capsule (500 mg total) by mouth every 12 (twelve) hours. 01/27/18  Yes Fields, Jessy Oto, MD  Cyanocobalamin (VITAMIN B 12 PO) Take 500 mcg by mouth daily.    Yes [provider]  fenofibrate (TRICOR) 145 MG tablet Take 145 mg by mouth every morning.   Yes [provider]  gabapentin (NEURONTIN) 300 MG capsule Take 300 mg by mouth 2 (two) times daily.   Yes [provider]  levofloxacin (LEVAQUIN) 500 MG tablet Take 0.5 tablets (250 mg total) by mouth daily. 02/24/18  Yes Fields, Jessy Oto, MD  levothyroxine (SYNTHROID, LEVOTHROID) 100 MCG tablet Take 100 mcg by mouth daily before breakfast.  01/07/18  Yes [provider]  methylcellulose (ARTIFICIAL TEARS) 1 % ophthalmic solution Place 1 drop into both eyes 2 (two) times daily as needed (dry eyes).   Yes [provider]  metolazone (ZAROXOLYN) 5 MG tablet Take 5 mg by mouth every other day. Take every other day until your appointment Wednesday 9/25   Yes [provider]  Multiple Vitamin (MULTIVITAMIN WITH MINERALS) TABS tablet Take 1 tablet by mouth daily. 12/03/17  Yes Rhyne, Samantha J, PA-C  nitroGLYCERIN (NITROSTAT) 0.4 MG SL tablet PLACE 1 TABLET UNDER TONGUE EVERY 5 MINUTES AS NEEDED FOR CHEST PAIN. Patient taking differently: Place 0.4 mg under the tongue every 5 (five) minutes as needed for chest pain.  09/17/15  Yes Belva Crome, MD  oxymetazoline (VICKS SINEX) 0.05 % nasal spray Place 1 spray into both nostrils 2 (two) times daily as needed for congestion.   Yes [provider]  pioglitazone (ACTOS) 30 MG tablet Take 1 tablet (30 mg total) by mouth daily with lunch. 12/03/17  Yes Rhyne, Samantha J, PA-C  potassium chloride SA (K-DUR,KLOR-CON) 20 MEQ tablet Take 20 mEq by mouth daily.    Yes [provider]  pseudoephedrine-guaifenesin (MUCINEX D) 60-600 MG per tablet Take 1 tablet by mouth every 12 (twelve) hours as needed for congestion.    Yes [provider]    sodium chloride (OCEAN) 0.65 % SOLN nasal spray Place 1 spray into both nostrils 2 (two) times daily as needed for congestion.   Yes [provider]  tamsulosin (FLOMAX) 0.4 MG CAPS capsule Take 0.4 mg by mouth every other  day.   Yes [provider]  temazepam (RESTORIL) 15 MG capsule Take 15 mg by mouth at bedtime as needed for sleep.    Yes [provider]  torsemide (DEMADEX) 20 MG tablet Take 3 tablets (60 mg total) by mouth 2 (two) times daily. 02/03/18  Yes Belva Crome, MD  UNABLE TO FIND Apply 1 application topically every 6 (six) hours as needed (pain). Med Name: *Ibuprofen 15% Baclofen 1% Lidocaine 10% Gabapentin 3% Topical Cream** To wrist   Yes [provider]  zinc gluconate 50 MG tablet Take 50 mg by mouth daily.   Yes [provider]  cetirizine (ZYRTEC ALLERGY) 10 MG tablet Take 1 tablet (10 mg total) by mouth daily as needed for allergies. Patient not taking: Reported on 02/25/2018 11/01/17   Gabriel Earing, PA-C  senna-docusate (SENOKOT-S) 8.6-50 MG tablet Take 1 tablet by mouth at bedtime as needed for mild constipation. Patient not taking: Reported on 02/25/2018 12/02/17   Gabriel Earing, PA-C    Inpatient Medications: Scheduled Meds:  Continuous Infusions: . vancomycin 2,000 mg (02/25/18 1424)   PRN Meds:   Allergies:    Allergies  Allergen Reactions  . Amoxicillin-Pot Clavulanate Nausea Only    Has patient had a PCN reaction causing immediate rash, facial/tongue/throat swelling, SOB or lightheadedness with hypotension: No Has patient had a PCN reaction causing severe rash involving mucus membranes or skin necrosis: No Has patient had a PCN reaction that required hospitalization: No Has patient had a PCN reaction occurring within the last 10 years: No If all of the above answers are "NO", then may proceed with Cephalosporin use.   Marland Kitchen Morphine And Related Other (See Comments)    hallucinates    Social History:    Social History   Socioeconomic History  . Marital status: Married    Spouse name: Not on file  . Number of children: 2  . Years of education: Not on file  . Highest education level: Professional school degree (e.g., MD, DDS, DVM, JD)  Occupational History  . Occupation: retired Chief Executive Officer  Social Needs  . Financial resource strain: Not on file  . Food insecurity:    Worry: Not on file    Inability: Not on file  . Transportation needs:    Medical: Not on file    Non-medical: Not on file  Tobacco Use  . Smoking status: Former Smoker    Years: 40.00    Types: Cigarettes    Last attempt to quit: 10/27/2005    Years since quitting: 12.3  . Smokeless tobacco: Never Used  Substance and Sexual Activity  . Alcohol use: Yes    Alcohol/week: 2.0 - 3.0 standard drinks    Types: 2 - 3 Cans of beer per week    Comment: occasional- weekly  . Drug use: No  . Sexual activity: Never  Lifestyle  . Physical activity:    Days per week: Not on file    Minutes per session: Not on file  . Stress: Not on file  Relationships  . Social connections:    Talks on phone: Not on file    Gets together: Not on file    Attends religious service: Not on file    Active member of club or organization: Not on file    Attends meetings of clubs or organizations: Not on file    Relationship status: Not on file  . Intimate partner violence:    Fear of current or ex partner: Not  on file    Emotionally abused: Not on file    Physically abused: Not on file    Forced sexual activity: Not on file  Other Topics Concern  . Not on file  Social History Narrative   He lives with wife in a 1 story home, three steps to entire home.  2 children.  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      Family History:    Family History  Problem Relation Age of Onset  . Heart disease Mother   . Heart disease Father   . Anesthesia problems Neg Hx   . Hypotension Neg Hx   . Malignant hyperthermia Neg Hx   . Pseudochol  deficiency Neg Hx      ROS:  Please see the history of present illness.   All other ROS reviewed and negative.     Physical Exam/Data:   Vitals:   02/25/18 1245 02/25/18 1351 02/25/18 1415 02/25/18 1417  BP: (!) 119/101 (!) 170/63 (!) 152/56   Pulse: (!) 54 (!) 58  62  Resp: 13 14  (!) 22  Temp:      TempSrc:      SpO2: 95% 96%  93%  Weight:      Height:        Intake/Output Summary (Last 24 hours) at 02/25/2018 1446 Last data filed at 02/25/2018 1401 Gross per 24 hour  Intake 41.81 ml  Output -  Net 41.81 ml   Filed Weights   02/25/18 1113  Weight: 113.4 kg   Body mass index is 34.87 kg/m.  General:  Well nourished, well developed, in no acute distress HEENT: normal Neck: + JVD Vascular: No carotid bruits Cardiac:  normal S1, S2; RRR; no murmur  Lungs:  Respirations unlabored on 2L [place], diminished with crackles in bases  Abd: soft, nontender, no hepatomegaly  Ext: + B LE edema with erythema L > R, pulses intact by doppler Musculoskeletal:  No deformities, BUE and BLE strength normal and equal Skin: warm and dry  Neuro:  CNs 2-12 intact, no focal abnormalities noted Psych:  Normal affect   EKG:  The EKG was personally reviewed and demonstrates:  Sinus brady with RBBB (not new) Telemetry:  Telemetry was personally reviewed and demonstrates:  sinus  Relevant CV Studies:  2D echo 02/09/2018 Study Conclusions - Left ventricle: The cavity size was normal. There was moderate concentric hypertrophy. Systolic function was mildly to moderately reduced. The estimated ejection fraction was in the range of 40% to 45%. Anterior hypokinesis. Grade 2 DD with high LV filling pressure. - Aortic valve: Calcified without stenosis. Mean gradient (S): 6 mm Hg. - Left atrium: Moderately dilated. - Right atrium: The atrium was mildly dilated. - Inferior vena cava: The vessel was normal in size. The respirophasic diameter changes were in the normal range (>=  50%), consistent with normal central venous pressure.  Impressions: - Technically difficult study. LVEF 40-45%, more significant anterior hypokinesis, grade 2 DD, high LV filling pressure, moderate LAE, mild RAE.   Laboratory Data:  Chemistry Recent Labs  Lab 02/24/18 1243 02/25/18 1114  NA 136 136  K 3.3* 2.6*  CL 86* 88*  CO2 29 33*  GLUCOSE 152* 197*  BUN 86* 92*  CREATININE 2.37* 2.43*  CALCIUM 9.7 9.6  GFRNONAA 25* 24*  GFRAA 29* 28*  ANIONGAP  --  15    No results for input(s): PROT, ALBUMIN, AST, ALT, ALKPHOS, BILITOT in the last 168 hours. Hematology Recent Labs  Lab  02/25/18 1114  WBC 4.7  RBC 3.82*  HGB 11.3*  HCT 34.8*  MCV 91.1  MCH 29.6  MCHC 32.5  RDW 16.6*  PLT 188   Cardiac EnzymesNo results for input(s): TROPONINI in the last 168 hours.  Recent Labs  Lab 02/25/18 1136  TROPIPOC 0.01    BNP Recent Labs  Lab 02/24/18 1243  PROBNP 17,251*    DDimer No results for input(s): DDIMER in the last 168 hours.  Radiology/Studies:  Dg Chest 2 View  Result Date: 02/25/2018 CLINICAL DATA:  Swelling both extremities.  Prior vascular surgery. EXAM: CHEST - 2 VIEW COMPARISON:  10/11/2017. FINDINGS: Prior CABG. Cardiomegaly with mild bilateral interstitial prominence consistent CHF. No focal infiltrate. No pneumothorax. IMPRESSION: Prior CABG. Cardiomegaly with diffuse bilateral interstitial prominence consistent with CHF. Electronically Signed   By: Marcello Moores  Register   On: 02/25/2018 11:41    Assessment and Plan:   1. Acute on chronic systolic and diastolic heart failure - recent echo with newly reduced EF of 40-45% and grade 2 DD - pt is volume up on exam with supporting CXR and BNP > 1800 - will start 80 mg IV lasix BID - will likely need a right and left heart cath once renal functio improves - appreciate guidance from nephrology - request consult from Advanced Heart Failure team - plan to admit to hospitalist service - stepdown  bed   2. Acute on chronic kidney disease stage III - baseline prior to vascular intervention was 1.3-1.4 - sCr has increased to 2.43 - will ask primary to request nephrology consult   3. Hypokalemia - K 2.9 - replace per primary   4. Hx of recent vascular intervention - per vascular team - receiving Vanc   5. CAD s/p CABG  - denies chest pain - new reduction on LVEF, will defer ischemic evaluation until fluid balance and renal function are restored      For questions or updates, please contact [place] Please consult [email or web address] for contact info under     Signed, Ledora Bottcher, PA  02/25/2018 2:46 PM   Patient examined chart reviewed. Discussed care with PA, patient and wife. Progressive volume overload not responsive to oral diuretics as outpatient. CRF with CR 2.65. Very poor functional status post aortobif grafting History of CABG with no recent ischemic evaluation and drop in EF to 40-45% range Exam with obese white male JVP elevated bibasilar rales post sternotomy bilateral CEA distant heart sounds. Recent abdominal surgery with persistent erythema in lower abdomen and groin at surgical sights plus 2 edema to mid thigh. Apparently Dr Jeffie Pollock has reviewed chart and did not think he was a candidate for advanced CHF Rx;s Will admit supplement K try diuresis with iv lasix but fear he will become more azotemic. At some point I feel he may be appropriate for milrinone to try and mobilize fluid without worsening renal failure but will have to see   Jenkins Rouge

## 2018-02-25 NOTE — Progress Notes (Signed)
*  Preliminary Results* Bilateral lower extremity venous duplex completed. Bilateral lower extremities are negative for deep vein thrombosis.  Left lower extremity is positive for acute superficial vein thrombosis in the great saphenous vein.  There is no evidence of Baker's cyst bilaterally.  Attempted to give results to RN, no answer on extension [id number] or [id number].  02/25/2018 3:12 PM Abram Sander

## 2018-02-25 NOTE — ED Notes (Signed)
Pt developed itching reaction to vancomycin. Vanc stopped, MD aware, orders received.

## 2018-02-25 NOTE — H&P (Signed)
TRH H&P   Patient Demographics:    [name], is a 77 y.o. male  MRN: [id number]   DOB - [birth year]  Admit Date - 02/25/2018  Outpatient Primary MD for the patient is Lavone Orn, MD  Referring MD/NP/PA: Dr Francia Greaves  Outpatient Specialists: Dr. Daneen Schick  Patient coming from: home  Chief Complaint  Patient presents with  . Leg Swelling      HPI:    [name]  is a 77 y.o. male, with a hx of CAD s/p CABG in 1025, chronic diastolic heart failure, bilateral carotid artery disease s/p endarterectomy, PAD with claudication s/p aortobifemoral bypass, HTN, CKD, and HLD, who presents today secondary to worsening lower extremity edema and progressive dyspnea, patient reports dyspnea, all day, worsening recently with worsening orthopnea as well, reports he has been sleeping sitting up for last few days, his diuresis has been adjusted recently by his cardiologist, and he still reports significant weight gain, as well he did have worsening renal function with diuresis, he denies any chest pain, cough, productive sputum fever or chills, as well he is been followed by Dr. Oneida Alar since femoral-popliteal bypass last June, recently started on oral Levaquin for surrounding cellulitis. -In ED patient was hypoxic requiring oxygen, chest x-ray significant for CHF, as well had worsening renal function from baseline, he was noted to have abdominal wall cellulitis when he was seen by Dr. Oneida Alar, patient started on cefazolin, and seen by cardiology where he was started on IV Lasix.    Review of systems:    In addition to the HPI above, No Fever-chills, No Headache, No changes with Vision or hearing, No problems swallowing food or Liquids, No Chest pain, Cough , reports progressive dyspnea, mainly orthopnea, sleeping sitting up No Abdominal pain, No Nausea or Vommitting, Bowel movements are  regular, No Blood in stool or Urine, No dysuria, No new skin rashes or bruises, No new joints pains-aches,  Denies any new focal weakness, tingling or numbness, but he does report generalized weakness No recent weight gain or loss, No polyuria, polydypsia or polyphagia, No significant Mental Stressors.  A full 10 point Review of Systems was done, except as stated above, all other Review of Systems were negative.   With Past History of the following :    Past Medical History:  Diagnosis Date  . Arthritis   . Atherosclerotic heart disease   . Back pain   . Bruises easily    takes Pletal daily and ASA 325mg  daily  . Cataract immature    right  . Chest pain   . Colon polyps   . Complication of anesthesia    hard to wake up with bypass surgery  . Cyst    on perineum;takes Doxycycline daily  . Diabetes mellitus    takes Glipizide,Metformin,and Actos daily  . Diarrhea   . Dyspnea    when lying flat  .  Edema    right leg knee down swollen since fall 3 wks ago  . Foot drop, left   . GERD (gastroesophageal reflux disease)    Rolaids as needed-occ reflux  . Hemorrhoids   . History of kidney stones    at age 24   . Hyperlipidemia    takes Tricor and Lipitor daily  . Hypertension    takes Altace,Amlodipine,and Nadolol daily  . Hypothyroidism   . Kidney stone    35+yrs ago  . Muscle pain   . Nasal polyps    hx of  . Peripheral edema    wears knee length hose-told by podiatrist to wear these  . Peripheral neuropathy    takes Gabapentin daily  . Pneumonia    as a child  . PVD (peripheral vascular disease) ([place])   . Renal insufficiency   . Seasonal allergies    takes Mucinex and Zyrtec prn  . Urinary frequency    urgency/takes Vesicare daily      Past Surgical History:  Procedure Laterality Date  . Aortobifemoral bypass    . APPLICATION OF WOUND VAC Left 10/25/2017   Procedure: Wound vac placement to left groin.;  Surgeon: Elam Dutch, MD;  Location: [facility] OR;   Service: Vascular;  Laterality: Left;  . APPLICATION OF WOUND VAC Left 11/10/2017   Procedure: APPLICATION OF WOUND VAC;  Surgeon: Elam Dutch, MD;  Location: [place];  Service: Vascular;  Laterality: Left;  . CARDIAC CATHETERIZATION  most recent 05/2011   total of 4  . CAROTID ANGIOGRAM N/A 06/05/2011   Procedure: CAROTID ANGIOGRAM;  Surgeon: Elam Dutch, MD;  Location: [facility] CATH LAB;  Service: Cardiovascular;  Laterality: N/A;  . CAROTID ENDARTERECTOMY  04/08/2011   left CEA  . cataract surgery Bilateral    left  . COLONOSCOPY    . COLONOSCOPY WITH PROPOFOL N/A 08/22/2013   Procedure: COLONOSCOPY WITH PROPOFOL;  Surgeon: Garlan Fair, MD;  Location: [facility] ENDOSCOPY;  Service: Endoscopy;  Laterality: N/A;  . CORONARY ARTERY BYPASS GRAFT  09/09/2011   Procedure: CORONARY ARTERY BYPASS GRAFTING (CABG);  Surgeon: Gaye Pollack, MD;  Location: [place];  Service: Open Heart Surgery;  Laterality: N/A;  Coronary artery bypass grafting  x three with Right saphenous vein harvested endoscopically and left internal mammary artery  . ENDARTERECTOMY  04/08/2011   Procedure: ENDARTERECTOMY CAROTID;  Surgeon: Elam Dutch, MD;  Location: [facility] OR;  Service: Vascular;  Laterality: Left;  with patch angioplasty  . ENDARTERECTOMY  09/09/2011   Procedure: ENDARTERECTOMY CAROTID;  Surgeon: Elam Dutch, MD;  Location: [facility] OR;  Service: Vascular;  Laterality: Right;  with patch angioplasty  . FEMORAL-POPLITEAL BYPASS GRAFT Left 10/25/2017   Procedure: Left Common Femoral Endarterectomy and perfundaplasty, Left BYPASS GRAFT FEMORAL-BELOW KNEE POPLITEAL ARTERY.;  Surgeon: Elam Dutch, MD;  Location: [place];  Service: Vascular;  Laterality: Left;  . GROIN DEBRIDEMENT Left 11/10/2017   Procedure: GROIN DEBRIDEMENT;  Surgeon: Elam Dutch, MD;  Location: [place];  Service: Vascular;  Laterality: Left;  . LEFT HEART CATHETERIZATION WITH CORONARY ANGIOGRAM N/A 07/09/2011   Procedure: LEFT HEART  CATHETERIZATION WITH CORONARY ANGIOGRAM;  Surgeon: Sinclair Grooms, MD;  Location: [facility] CATH LAB;  Service: Cardiovascular;  Laterality: N/A;  . LOWER EXTREMITY ANGIOGRAPHY N/A 10/11/2017   Procedure: LOWER EXTREMITY ANGIOGRAPHY;  Surgeon: Waynetta Sandy, MD;  Location: [place] CV LAB;  Service: Cardiovascular;  Laterality: N/A;  . Reimplantation of inferior mesenteric artery    .  Repair of infrarenal abdominal aortic aneurysm    . SHOULDER ARTHROSCOPY W/ ROTATOR CUFF REPAIR     right and left-open procedures  . TONSILLECTOMY     at age 21  . TRIGGER FINGER RELEASE Right 06/28/2014   Procedure: RIGHT LONG TRIGGER RELEASE ;  Surgeon: Leanora Cover, MD;  Location: [place];  Service: Orthopedics;  Laterality: Right;  . wisdom teeth extracted     as a teenager  . WOUND DEBRIDEMENT Left 11/05/2017   Procedure: DEBRIDEMENT WOUND LEFT GROIN with wound vac placement;  Surgeon: Elam Dutch, MD;  Location: [facility] OR;  Service: Vascular;  Laterality: Left;      Social History:     Social History   Tobacco Use  . Smoking status: Former Smoker    Years: 40.00    Types: Cigarettes    Last attempt to quit: 10/27/2005    Years since quitting: 12.3  . Smokeless tobacco: Never Used  Substance Use Topics  . Alcohol use: Yes    Alcohol/week: 2.0 - 3.0 standard drinks    Types: 2 - 3 Cans of beer per week    Comment: occasional- weekly     Lives - at home  Mobility - with walker    Family History :     Family History  Problem Relation Age of Onset  . Heart disease Mother   . Heart disease Father   . Anesthesia problems Neg Hx   . Hypotension Neg Hx   . Malignant hyperthermia Neg Hx   . Pseudochol deficiency Neg Hx       Home Medications:   Prior to Admission medications   Medication Sig Start Date End Date Taking? Authorizing Provider  Ascorbic Acid (VITAMIN C PO) Take 1 tablet by mouth 2 (two) times daily.   Yes [provider]  aspirin EC  81 MG tablet Take 1 tablet (81 mg total) by mouth daily. 02/03/18  Yes Belva Crome, MD  atorvastatin (LIPITOR) 20 MG tablet Take 20 mg by mouth every evening.    Yes [provider]  carvedilol (COREG) 12.5 MG tablet Take 1 tablet (12.5 mg total) by mouth 2 (two) times daily. 02/16/18 05/17/18 Yes Imogene Burn, PA-C  cephALEXin (KEFLEX) 500 MG capsule Take 1 capsule (500 mg total) by mouth every 12 (twelve) hours. 01/27/18  Yes Fields, Jessy Oto, MD  Cyanocobalamin (VITAMIN B 12 PO) Take 500 mcg by mouth daily.    Yes [provider]  fenofibrate (TRICOR) 145 MG tablet Take 145 mg by mouth every morning.   Yes [provider]  gabapentin (NEURONTIN) 300 MG capsule Take 300 mg by mouth 2 (two) times daily.   Yes [provider]  levofloxacin (LEVAQUIN) 500 MG tablet Take 0.5 tablets (250 mg total) by mouth daily. 02/24/18  Yes Fields, Jessy Oto, MD  levothyroxine (SYNTHROID, LEVOTHROID) 100 MCG tablet Take 100 mcg by mouth daily before breakfast.  01/07/18  Yes [provider]  methylcellulose (ARTIFICIAL TEARS) 1 % ophthalmic solution Place 1 drop into both eyes 2 (two) times daily as needed (dry eyes).   Yes [provider]  metolazone (ZAROXOLYN) 5 MG tablet Take 5 mg by mouth every other day. Take every other day until your appointment Wednesday 9/25   Yes [provider]  Multiple Vitamin (MULTIVITAMIN WITH MINERALS) TABS tablet Take 1 tablet by mouth daily. 12/03/17  Yes Rhyne, Hulen Shouts, PA-C  nitroGLYCERIN (NITROSTAT) 0.4 MG SL tablet PLACE 1 TABLET  UNDER TONGUE EVERY 5 MINUTES AS NEEDED FOR CHEST PAIN. Patient taking differently: Place 0.4 mg under the tongue every 5 (five) minutes as needed for chest pain.  09/17/15  Yes Belva Crome, MD  oxymetazoline (VICKS SINEX) 0.05 % nasal spray Place 1 spray into both nostrils 2 (two) times daily as needed for congestion.   Yes [provider]  pioglitazone (ACTOS) 30 MG tablet  Take 1 tablet (30 mg total) by mouth daily with lunch. 12/03/17  Yes Rhyne, Samantha J, PA-C  potassium chloride SA (K-DUR,KLOR-CON) 20 MEQ tablet Take 20 mEq by mouth daily.    Yes [provider]  pseudoephedrine-guaifenesin (MUCINEX D) 60-600 MG per tablet Take 1 tablet by mouth every 12 (twelve) hours as needed for congestion.    Yes [provider]  sodium chloride (OCEAN) 0.65 % SOLN nasal spray Place 1 spray into both nostrils 2 (two) times daily as needed for congestion.   Yes [provider]  tamsulosin (FLOMAX) 0.4 MG CAPS capsule Take 0.4 mg by mouth every other day.   Yes [provider]  temazepam (RESTORIL) 15 MG capsule Take 15 mg by mouth at bedtime as needed for sleep.    Yes [provider]  torsemide (DEMADEX) 20 MG tablet Take 3 tablets (60 mg total) by mouth 2 (two) times daily. 02/03/18  Yes Belva Crome, MD  UNABLE TO FIND Apply 1 application topically every 6 (six) hours as needed (pain). Med Name: *Ibuprofen 15% Baclofen 1% Lidocaine 10% Gabapentin 3% Topical Cream** To wrist   Yes [provider]  zinc gluconate 50 MG tablet Take 50 mg by mouth daily.   Yes [provider]  cetirizine (ZYRTEC ALLERGY) 10 MG tablet Take 1 tablet (10 mg total) by mouth daily as needed for allergies. Patient not taking: Reported on 02/25/2018 11/01/17   Gabriel Earing, PA-C  senna-docusate (SENOKOT-S) 8.6-50 MG tablet Take 1 tablet by mouth at bedtime as needed for mild constipation. Patient not taking: Reported on 02/25/2018 12/02/17   Gabriel Earing, PA-C     Allergies:     Allergies  Allergen Reactions  . Amoxicillin-Pot Clavulanate Nausea Only    Has patient had a PCN reaction causing immediate rash, facial/tongue/throat swelling, SOB or lightheadedness with hypotension: No Has patient had a PCN reaction causing severe rash involving mucus membranes or skin necrosis: No Has patient had a PCN reaction that required  hospitalization: No Has patient had a PCN reaction occurring within the last 10 years: No If all of the above answers are "NO", then may proceed with Cephalosporin use.   Marland Kitchen Morphine And Related Other (See Comments)    hallucinates     Physical Exam:   Vitals  Blood pressure 138/64, pulse 65, temperature 97.7 F (36.5 C), temperature source Oral, resp. rate 19, height 5\' 11"  (1.803 m), weight 113.4 kg, SpO2 100 %.   1. General well  developed male, pleasant, laying in bed in no apparent distress  2. Normal affect and insight, Not Suicidal or Homicidal, Awake Alert, Oriented X 3.  3. No F.N deficits, ALL C.Nerves Intact, Strength 5/5 all 4 extremities, Sensation intact all 4 extremities, Plantars down going.  4. Ears and Eyes appear Normal, Conjunctivae clear, PERRLA. Moist Oral Mucosa.  5. Supple Neck, + JVD, No cervical lymphadenopathy appriciated, No Carotid Bruits.  6. Symmetrical Chest wall movement, air entry diminished at the bases with some crackles  7. RRR, No Gallops, Rubs or Murmurs, No Parasternal  Heave.  8. Positive Bowel Sounds, Abdomen Soft, No tenderness, No organomegaly appriciated,No rebound -guarding or rigidity.  Shunt with left abdominal wall mild cellulitis, please review the picture and Dr. feels note, left groin area wound site looks clean with no discharge or oozing  9.  No Cyanosis, Normal Skin Turgor, he does have lower extremity erythema, this is chronic per wife  10. Good muscle tone,  joints appear normal , no effusions, Normal ROM.  11. No Palpable Lymph Nodes in Neck or Axillae     Data Review:    CBC Recent Labs  Lab 02/25/18 1114  WBC 4.7  HGB 11.3*  HCT 34.8*  PLT 188  MCV 91.1  MCH 29.6  MCHC 32.5  RDW 16.6*   ------------------------------------------------------------------------------------------------------------------  Chemistries  Recent Labs  Lab 02/24/18 1243 02/25/18 1114  NA 136 136  K 3.3* 2.6*  CL 86* 88*    CO2 29 33*  GLUCOSE 152* 197*  BUN 86* 92*  CREATININE 2.37* 2.43*  CALCIUM 9.7 9.6   ------------------------------------------------------------------------------------------------------------------ estimated creatinine clearance is 32.1 mL/min (A) (by C-G formula based on SCr of 2.43 mg/dL (H)). ------------------------------------------------------------------------------------------------------------------ No results for input(s): TSH, T4TOTAL, T3FREE, THYROIDAB in the last 72 hours.  Invalid input(s): FREET3  Coagulation profile No results for input(s): INR, PROTIME in the last 168 hours. ------------------------------------------------------------------------------------------------------------------- No results for input(s): DDIMER in the last 72 hours. -------------------------------------------------------------------------------------------------------------------  Cardiac Enzymes No results for input(s): CKMB, TROPONINI, MYOGLOBIN in the last 168 hours.  Invalid input(s): CK ------------------------------------------------------------------------------------------------------------------    Component Value Date/Time   BNP 1,809.7 (H) 02/25/2018 1340     ---------------------------------------------------------------------------------------------------------------  Urinalysis    Component Value Date/Time   COLORURINE YELLOW 11/15/2017 1004   APPEARANCEUR CLEAR 11/15/2017 1004   LABSPEC 1.008 11/15/2017 1004   PHURINE 6.0 11/15/2017 1004   GLUCOSEU NEGATIVE 11/15/2017 1004   HGBUR MODERATE (A) 11/15/2017 1004   BILIRUBINUR NEGATIVE 11/15/2017 1004   KETONESUR NEGATIVE 11/15/2017 1004   PROTEINUR NEGATIVE 11/15/2017 1004   UROBILINOGEN 4.0 (H) 09/23/2011 1335   NITRITE NEGATIVE 11/15/2017 1004   LEUKOCYTESUR SMALL (A) 11/15/2017 1004    ----------------------------------------------------------------------------------------------------------------    Imaging Results:    Dg Chest 2 View  Result Date: 02/25/2018 CLINICAL DATA:  Swelling both extremities.  Prior vascular surgery. EXAM: CHEST - 2 VIEW COMPARISON:  10/11/2017. FINDINGS: Prior CABG. Cardiomegaly with mild bilateral interstitial prominence consistent CHF. No focal infiltrate. No pneumothorax. IMPRESSION: Prior CABG. Cardiomegaly with diffuse bilateral interstitial prominence consistent with CHF. Electronically Signed   By: Marcello Moores  Register   On: 02/25/2018 11:41    My personal review of EKG: Rhythm NSR, Rate  58 /min, QTc 522 , right bundle branch block, no Acute ST changes   Assessment & Plan:    Active Problems:   Diabetes mellitus type 2 with complications (HCC)   Essential hypertension   Chronic kidney disease, stage III (moderate) (HCC)   CHF (congestive heart failure) (HCC)   Acute on chronic systolic/diastolic CHF -Most recent echo with EF 40 to 45%, with a grade 2 diastolic dysfunction, patient with significant volume overload, as evident on +3 lower extremity edema, and chest x-ray. -Worsening renal function remains the most limiting factor of his diuresis, management per cardiology, kidney with IV Lasix per cardiology recommendation, replete his potassium, daily weight, strict ins and out, per cardiology note at one-point may require melatonin and CHF team involvement/ -Continue with beta-blockers  AKI am CKD stage III -Continue to monitor closely as on IV diuresis,  Hypokalemia -repleting monitor closely as on high-dose IV Lasix  History of PAD -Status post recent femoropopliteal bypass, vascular surgery following  Cellulitis -Patient with abdominal wall cellulitis, continue with IV cefazolin  CAD -Continue with aspirin, denies any chest pain  Hypertension -Continue with home medication  Diabetes mellitus -Hold Actos, continue with insulin sliding scale  Prolonged QTC -Replete potassium, will give 1 g of magnesium, monitor on telemetry    DVT  Prophylaxis Heparin -   SCDs  AM Labs Ordered, also please review Full Orders  Family Communication: Admission, patients condition and plan of care including tests being ordered have been discussed with the patient and wife who indicate understanding and agree with the plan and Code Status.  Code Status Full  Likely DC to :will need PT when stable  Condition GUARDED    Consults called: cardiology, vasculae  Admission status: inpatient  Time spent in minutes : 65 minutes   Phillips Climes M.D on 02/25/2018 at 5:43 PM  Between 7am to 7pm - Pager - [phone]. After 7pm go to [email or web address] - password [email or web address]  [facility] - Office  [phone]

## 2018-02-25 NOTE — ED Notes (Signed)
CRITICAL VALUE STICKER  CRITICAL VALUE: potassium 2.6  RECEIVER (on-site recipient of call): Coats NOTIFIED: 12:40  MESSENGER (representative from lab):  MD NOTIFIED: Dr. Francia Greaves   TIME OF NOTIFICATION: 12:41  RESPONSE: MD aware and orders with be completed

## 2018-02-25 NOTE — ED Notes (Signed)
ED TO INPATIENT HANDOFF REPORT  Name/Age/Gender [name] 77 y.o. male  Code Status    Code Status Orders  (From admission, onward)         Start     Ordered   02/25/18 1721  Full code  Continuous     02/25/18 1721        Code Status History    Date Active Date Inactive Code Status Order ID Comments User Context   11/04/2017 1754 12/03/2017 1414 Full Code [id number]  Dagoberto Ligas, PA-C Inpatient   10/25/2017 1600 11/02/2017 1432 Full Code [id number]  Dagoberto Ligas, PA-C Inpatient   10/11/2017 1646 10/11/2017 2348 Full Code [id number]  Waynetta Sandy, MD Inpatient   09/21/2011 0817 09/29/2011 1628 Full Code [id number]  [name], MD Inpatient   09/14/2011 1555 09/21/2011 0817 Full Code [id number]  Lemont Fillers, RN Inpatient   04/08/2011 1702 04/09/2011 1416 Full Code [id number]  Love, Kaylyn Layer, RN Inpatient      Home/SNF/Other Home  Chief Complaint leg swelling Congestive heart failure   Level of Care/Admitting Diagnosis ED Disposition    ED Disposition Condition [facility] Area: [place] [100100]  Level of Care: Stepdown [14]  Diagnosis: CHF (congestive heart failure) [facility]) [492010]  Admitting Physician: Manfred Shirts  Attending Physician: Waldron Labs, DAWOOD S [[id number]]  Estimated length of stay: 3 - 4 days  Certification:: I certify this patient will need inpatient services for at least 2 midnights  PT Class (Do Not Modify): Inpatient [101]  PT Acc Code (Do Not Modify): Private [1]       Medical History Past Medical History:  Diagnosis Date  . Arthritis   . Atherosclerotic heart disease   . Back pain   . Bruises easily    takes Pletal daily and ASA 344m daily  . Cataract immature    right  . Chest pain   . Colon polyps   . Complication of anesthesia    hard to wake up with bypass surgery  . Cyst    on perineum;takes Doxycycline daily  . Diabetes mellitus    takes  Glipizide,Metformin,and Actos daily  . Diarrhea   . Dyspnea    when lying flat  . Edema    right leg knee down swollen since fall 3 wks ago  . Foot drop, left   . GERD (gastroesophageal reflux disease)    Rolaids as needed-occ reflux  . Hemorrhoids   . History of kidney stones    at age 77  . Hyperlipidemia    takes Tricor and Lipitor daily  . Hypertension    takes Altace,Amlodipine,and Nadolol daily  . Hypothyroidism   . Kidney stone    35+yrs ago  . Muscle pain   . Nasal polyps    hx of  . Peripheral edema    wears knee length hose-told by podiatrist to wear these  . Peripheral neuropathy    takes Gabapentin daily  . Pneumonia    as a child  . [place]   . Renal insufficiency   . Seasonal allergies    takes Mucinex and Zyrtec prn  . Urinary frequency    urgency/takes Vesicare daily    Allergies Allergies  Allergen Reactions  . Amoxicillin-Pot Clavulanate Nausea Only    Has patient had a PCN reaction causing immediate rash, facial/tongue/throat swelling, SOB or lightheadedness with hypotension: No Has patient had a PCN reaction causing severe  rash involving mucus membranes or skin necrosis: No Has patient had a PCN reaction that required hospitalization: No Has patient had a PCN reaction occurring within the last 10 years: No If all of the above answers are "NO", then may proceed with Cephalosporin use.   Marland Kitchen Morphine And Related Other (See Comments)    hallucinates    IV Location/Drains/Wounds Patient Lines/Drains/Airways Status   Active Line/Drains/Airways    Name:   Placement date:   Placement time:   Site:   Days:   Peripheral IV 11/04/17 Right;Upper Arm   11/04/17    2140    Arm   113   Peripheral IV 02/25/18 Right Antecubital   02/25/18    1733    Antecubital   less than 1   Peripheral IV 02/25/18 Left Antecubital   02/25/18    1738    Antecubital   less than 1   Negative Pressure Wound Therapy Groin Left   11/05/17    1320     -   112   Airway   11/10/17    0858     107   Incision (Closed) 10/25/17 Leg Left   10/25/17    1407     123   Incision (Closed) 11/01/17 Thigh Anterior;Left   11/01/17    1930     116   Incision (Closed) 11/01/17 Leg Left;Lower;Lateral   11/01/17    1930     116   Incision (Closed) 11/10/17 Groin Left   11/10/17    0936     107   Wound / Incision (Open or Dehisced) 11/04/17 Venous stasis ulcer Foot Left Black cirle area; size of quarter   11/04/17    1817    Foot   113   Wound / Incision (Open or Dehisced) 11/04/17 Venous stasis ulcer Toe (Comment  which one) Left Great toe; black area size of quarter   11/04/17    1822    Toe (Comment  which one)   113          Labs/Imaging Results for orders placed or performed during the hospital encounter of 02/25/18 (from the past 48 hour(s))  Basic metabolic panel     Status: Abnormal   Collection Time: 02/25/18 11:14 AM  Result Value Ref Range   Sodium 136 135 - 145 mmol/L   Potassium 2.6 (LL) 3.5 - 5.1 mmol/L    Comment: CRITICAL RESULT CALLED TO, READ BACK BY AND VERIFIED WITH: [facility] 02/25/18 1233 DAVISB    Chloride 88 (L) 98 - 111 mmol/L   CO2 33 (H) 22 - 32 mmol/L   Glucose, Bld 197 (H) 70 - 99 mg/dL   BUN 92 (H) 8 - 23 mg/dL   Creatinine, Ser 2.43 (H) 0.61 - 1.24 mg/dL   Calcium 9.6 8.9 - 10.3 mg/dL   GFR calc non Af Amer 24 (L) >60 mL/min   GFR calc Af Amer 28 (L) >60 mL/min    Comment: (NOTE) The eGFR has been calculated using the CKD EPI equation. This calculation has not been validated in all clinical situations. eGFR's persistently <60 mL/min signify possible Chronic Kidney Disease.    Anion gap 15 5 - 15    Comment: Performed at [facility] [address] [place] [address]  CBC     Status: Abnormal   Collection Time: 02/25/18 11:14 AM  Result Value Ref Range   WBC 4.7 4.0 - 10.5 K/uL   RBC 3.82 (L)  4.22 - 5.81 MIL/uL   Hemoglobin 11.3 (L) 13.0 - 17.0 g/dL   HCT 34.8 (L) 39.0 - 52.0 %   MCV 91.1  78.0 - 100.0 fL   MCH 29.6 26.0 - 34.0 pg   MCHC 32.5 30.0 - 36.0 g/dL   RDW 16.6 (H) 11.5 - 15.5 %   Platelets 188 150 - 400 K/uL    Comment: Performed at [facility] [address], [place], [place] [address]  I-stat troponin, ED     Status: None   Collection Time: 02/25/18 11:36 AM  Result Value Ref Range   Troponin i, poc 0.01 0.00 - 0.08 ng/mL   Comment 3            Comment: Due to the release kinetics of cTnI, a negative result within the first hours of the onset of symptoms does not rule out myocardial infarction with certainty. If myocardial infarction is still suspected, repeat the test at appropriate intervals.   Brain natriuretic peptide     Status: Abnormal   Collection Time: 02/25/18  1:40 PM  Result Value Ref Range   B Natriuretic Peptide 1,809.7 (H) 0.0 - 100.0 pg/mL    Comment: Performed at [facility] [address] [place], [place] [address]  Culture, blood (routine x 2)     Status: None (Preliminary result)   Collection Time: 02/25/18  1:40 PM  Result Value Ref Range   Specimen Description BLOOD SITE NOT SPECIFIED    Special Requests      BOTTLES DRAWN AEROBIC AND ANAEROBIC Blood Culture adequate volume   Culture      NO GROWTH <12 HOURS Performed at [facility], [place] [address], [place], [place] [address]    Report Status PENDING    Dg Chest 2 View  Result Date: 02/25/2018 CLINICAL DATA:  Swelling both extremities.  Prior vascular surgery. EXAM: CHEST - 2 VIEW COMPARISON:  10/11/2017. FINDINGS: Prior CABG. Cardiomegaly with mild bilateral interstitial prominence consistent CHF. No focal infiltrate. No pneumothorax. IMPRESSION: Prior CABG. Cardiomegaly with diffuse bilateral interstitial prominence consistent with CHF. Electronically Signed   By: Marcello Moores  Register   On: 02/25/2018 11:41    Pending Labs Unresulted Labs (From admission, onward)    Start     Ordered   02/26/18 2297  Basic metabolic panel  Daily,   R     02/25/18 1721    02/26/18 0500  CBC  Tomorrow morning,   R     02/25/18 1721   02/25/18 9892  Basic metabolic panel  Once-Timed,   R     02/25/18 1710   02/25/18 1721  CBC  (heparin)  Once,   R    Comments:  Baseline for heparin therapy IF NOT ALREADY DRAWN.  Notify MD if PLT < 100 K.    02/25/18 1721   02/25/18 1721  Creatinine, serum  (heparin)  Once,   R    Comments:  Baseline for heparin therapy IF NOT ALREADY DRAWN.    02/25/18 1721   02/25/18 1246  Culture, blood (routine x 2)  BLOOD CULTURE X 2,   STAT     02/25/18 1246          Vitals/Pain Today's Vitals   02/25/18 1530 02/25/18 1600 02/25/18 1630 02/25/18 1700  BP: 116/87 (!) 167/62 (!) 159/59 138/64  Pulse: 68 67 63 65  Resp: 16 (!) 30 (!) 27 19  Temp:      TempSrc:  SpO2: 92% 98% 98% 100%  Weight:      Height:      PainSc:        Isolation Precautions No active isolations  Medications Medications  vancomycin (VANCOCIN) 2,000 mg in sodium chloride 0.9 % 500 mL IVPB (2,000 mg Intravenous New Bag/Given 02/25/18 1424)  potassium chloride 10 mEq in 100 mL IVPB (10 mEq Intravenous New Bag/Given 02/25/18 1736)  furosemide (LASIX) 120 mg in dextrose 5 % 50 mL IVPB (120 mg Intravenous New Bag/Given 02/25/18 1740)  potassium chloride SA (K-DUR,KLOR-CON) CR tablet 40 mEq (40 mEq Oral Given 02/25/18 1743)  ascorbic acid (VITAMIN C) 500 MG/5ML syrup 500 mg (has no administration in time range)  aspirin EC tablet 81 mg (has no administration in time range)  atorvastatin (LIPITOR) tablet 20 mg (has no administration in time range)  carvedilol (COREG) tablet 12.5 mg (has no administration in time range)  Vitamin B 12 LOZG 500 mcg (has no administration in time range)  fenofibrate tablet 54 mg (has no administration in time range)  gabapentin (NEURONTIN) capsule 300 mg (has no administration in time range)  levothyroxine (SYNTHROID, LEVOTHROID) tablet 100 mcg (has no administration in time range)  methylcellulose (ARTIFICIAL TEARS) 1 %  ophthalmic solution 1 drop (has no administration in time range)  multivitamin with minerals tablet 1 tablet (has no administration in time range)  potassium chloride SA (K-DUR,KLOR-CON) CR tablet 20 mEq (has no administration in time range)  sodium chloride (OCEAN) 0.65 % nasal spray 1 spray (has no administration in time range)  tamsulosin (FLOMAX) capsule 0.4 mg (has no administration in time range)  temazepam (RESTORIL) capsule 15 mg (has no administration in time range)  zinc gluconate tablet 50 mg (has no administration in time range)  heparin injection 5,000 Units (has no administration in time range)  insulin aspart (novoLOG) injection 0-9 Units (has no administration in time range)  vancomycin (VANCOCIN) 2,000 mg in sodium chloride 0.9 % 500 mL IVPB (0 mg Intravenous Stopped 02/25/18 1401)  diphenhydrAMINE (BENADRYL) injection 12.5 mg (12.5 mg Intravenous Given 02/25/18 1408)    Mobility Walks with device

## 2018-02-26 DIAGNOSIS — L03116 Cellulitis of left lower limb: Secondary | ICD-10-CM

## 2018-02-26 DIAGNOSIS — I5033 Acute on chronic diastolic (congestive) heart failure: Secondary | ICD-10-CM

## 2018-02-26 LAB — CBC
HCT: 32 % — ABNORMAL LOW (ref 39.0–52.0)
HEMOGLOBIN: 10.5 g/dL — AB (ref 13.0–17.0)
MCH: 29.7 pg (ref 26.0–34.0)
MCHC: 32.8 g/dL (ref 30.0–36.0)
MCV: 90.7 fL (ref 78.0–100.0)
PLATELETS: 164 10*3/uL (ref 150–400)
RBC: 3.53 MIL/uL — ABNORMAL LOW (ref 4.22–5.81)
RDW: 16.6 % — ABNORMAL HIGH (ref 11.5–15.5)
WBC: 6.3 10*3/uL (ref 4.0–10.5)

## 2018-02-26 LAB — BASIC METABOLIC PANEL
ANION GAP: 14 (ref 5–15)
BUN: 87 mg/dL — ABNORMAL HIGH (ref 8–23)
CALCIUM: 8.9 mg/dL (ref 8.9–10.3)
CHLORIDE: 90 mmol/L — AB (ref 98–111)
CO2: 31 mmol/L (ref 22–32)
Creatinine, Ser: 2.25 mg/dL — ABNORMAL HIGH (ref 0.61–1.24)
GFR calc Af Amer: 30 mL/min — ABNORMAL LOW (ref >60)
GFR calc non Af Amer: 26 mL/min — ABNORMAL LOW (ref >60)
Glucose, Bld: 186 mg/dL — ABNORMAL HIGH (ref 70–99)
Potassium: 3.2 mmol/L — ABNORMAL LOW (ref 3.5–5.1)
Sodium: 135 mmol/L (ref 135–145)

## 2018-02-26 LAB — GLUCOSE, CAPILLARY
GLUCOSE-CAPILLARY: 160 mg/dL — AB (ref 70–99)
GLUCOSE-CAPILLARY: 155 mg/dL — AB (ref 70–99)
Glucose-Capillary: 137 mg/dL — ABNORMAL HIGH (ref 70–99)
Glucose-Capillary: 170 mg/dL — ABNORMAL HIGH (ref 70–99)

## 2018-02-26 LAB — MRSA PCR SCREENING: MRSA BY PCR: NEGATIVE

## 2018-02-26 MED ORDER — ORAL CARE MOUTH RINSE
15.0000 mL | Freq: Two times a day (BID) | OROMUCOSAL | Status: DC
  Administered 2018-02-26 – 2018-03-03 (×9): 15 mL via OROMUCOSAL

## 2018-02-26 MED ORDER — CEFAZOLIN SODIUM-DEXTROSE 1-4 GM/50ML-% IV SOLN
1.0000 g | Freq: Three times a day (TID) | INTRAVENOUS | Status: DC
  Administered 2018-02-26 – 2018-03-02 (×12): 1 g via INTRAVENOUS
  Filled 2018-02-26 (×13): qty 50

## 2018-02-26 MED ORDER — POTASSIUM CHLORIDE CRYS ER 20 MEQ PO TBCR
20.0000 meq | EXTENDED_RELEASE_TABLET | Freq: Once | ORAL | Status: AC
  Administered 2018-02-26: 20 meq via ORAL

## 2018-02-26 NOTE — Progress Notes (Signed)
Pharmacy Antibiotic Note  [name] is a 77 y.o. male admitted on 02/25/2018 with abdominal wall cellulitis.  Pharmacy has been consulted for cefazolin dosing.   Plan: Cefazolin 1g IV q8h  Height: 5\' 11"  (180.3 cm) Weight: 250 lb 3.6 oz (113.5 kg) IBW/kg (Calculated) : 75.3  Temp (24hrs), Avg:98.3 F (36.8 C), Min:98.1 F (36.7 C), Max:98.6 F (37 C)  Recent Labs  Lab 02/24/18 1243 02/25/18 1114 02/25/18 2214 02/26/18 0235  WBC  --  4.7  --  6.3  CREATININE 2.37* 2.43* 2.33* 2.25*    Estimated Creatinine Clearance: 34.7 mL/min (A) (by C-G formula based on SCr of 2.25 mg/dL (H)).    Allergies  Allergen Reactions  . Amoxicillin-Pot Clavulanate Nausea Only    Has patient had a PCN reaction causing immediate rash, facial/tongue/throat swelling, SOB or lightheadedness with hypotension: No Has patient had a PCN reaction causing severe rash involving mucus membranes or skin necrosis: No Has patient had a PCN reaction that required hospitalization: No Has patient had a PCN reaction occurring within the last 10 years: No If all of the above answers are "NO", then may proceed with Cephalosporin use.   Marland Kitchen Morphine And Related Other (See Comments)    hallucinates    Antimicrobials this admission: Vancomycin x 1 10/4 Cefazolin 10/5 >>   Dose adjustments this admission:   Microbiology results: 10/4 BCx: NG x 12 hours 10/4 MRSA PCR: negative  Thank you for allowing pharmacy to be a part of this patient's care.   Claiborne Billings, PharmD PGY2 Cardiology Pharmacy Resident Phone [phone] Please check AMION for all Pharmacist numbers by unit 02/26/2018 11:59 AM

## 2018-02-26 NOTE — Progress Notes (Signed)
Notified Md pt hr 53 bp 114/83 coreg ordered no parameters.  Saunders Revel T

## 2018-02-26 NOTE — Progress Notes (Signed)
   VASCULAR SURGERY ASSESSMENT & PLAN:   CELLULITIS LEFT LOWER QUADRANT WITH LEFT GROIN WOUND: The cellulitis in his left lower quadrant has improved compared to the photograph in the chart from yesterday.  He is on intravenous vancomycin.  He was admitted yesterday with congestive heart failure and seems to be breathing more easily now.  Dr. Oneida Alar will check back on Monday.  SUBJECTIVE:   Breathing is better.  PHYSICAL EXAM:   Vitals:   02/25/18 2134 02/25/18 2341 02/26/18 0321 02/26/18 0549  BP: (!) 167/56 (!) 157/58 (!) 133/54   Pulse: 71 72 61   Resp:  (!) 23 (!) 21   Temp:  98.2 F (36.8 C) 98.4 F (36.9 C)   TempSrc:  Oral Oral   SpO2:  95% 96%   Weight:    113.5 kg  Height:       Cellulitis in the left lower quadrant has improved. Small wound left groin with minimal drainage.  LABS:   Lab Results  Component Value Date   WBC 6.3 02/26/2018   HGB 10.5 (L) 02/26/2018   HCT 32.0 (L) 02/26/2018   MCV 90.7 02/26/2018   PLT 164 02/26/2018   Lab Results  Component Value Date   CREATININE 2.25 (H) 02/26/2018   CBG (last 3)  Recent Labs    02/25/18 2116  GLUCAP 225*    PROBLEM LIST:    Active Problems:   Diabetes mellitus type 2 with complications (HCC)   Essential hypertension   Chronic kidney disease, stage III (moderate) (HCC)   CHF (congestive heart failure) (HCC)   CURRENT MEDS:   . ascorbic acid  500 mg Oral BID  . aspirin EC  81 mg Oral Daily  . atorvastatin  20 mg Oral QPM  . carvedilol  12.5 mg Oral BID  . feeding supplement (ENSURE ENLIVE)  237 mL Oral BID BM  . fenofibrate  54 mg Oral Daily  . gabapentin  300 mg Oral BID  . heparin  5,000 Units Subcutaneous Q8H  . insulin aspart  0-9 Units Subcutaneous TID WC  . levothyroxine  100 mcg Oral QAC breakfast  . mouth rinse  15 mL Mouth Rinse BID  . multivitamin with minerals  1 tablet Oral Daily  . potassium chloride SA  20 mEq Oral Daily  . tamsulosin  0.4 mg Oral QODAY  . vitamin B-12   500 mcg Oral Daily    Deitra Mayo Beeper: [phone] Office: [phone] 02/26/2018

## 2018-02-26 NOTE — Progress Notes (Signed)
Progress Note  Patient Name: [name] Date of Encounter: 02/26/2018  Primary Cardiologist: Sinclair Grooms, MD   Subjective   [name] continues to be short of breath.  He has 1-2+ pitting edema with some erythema in both lower extremities on high-dose IV Lasix with a negative I&O but moderate renal insufficiency.  Inpatient Medications    Scheduled Meds: . ascorbic acid  500 mg Oral BID  . aspirin EC  81 mg Oral Daily  . atorvastatin  20 mg Oral QPM  . carvedilol  12.5 mg Oral BID  . feeding supplement (ENSURE ENLIVE)  237 mL Oral BID BM  . fenofibrate  54 mg Oral Daily  . gabapentin  300 mg Oral BID  . heparin  5,000 Units Subcutaneous Q8H  . insulin aspart  0-9 Units Subcutaneous TID WC  . levothyroxine  100 mcg Oral QAC breakfast  . mouth rinse  15 mL Mouth Rinse BID  . multivitamin with minerals  1 tablet Oral Daily  . potassium chloride SA  20 mEq Oral Daily  . tamsulosin  0.4 mg Oral QODAY  . vitamin B-12  500 mcg Oral Daily   Continuous Infusions: . furosemide 120 mg (02/25/18 1740)   PRN Meds: acetaminophen, polyvinyl alcohol, sodium chloride, temazepam   Vital Signs    Vitals:   02/25/18 2341 02/26/18 0321 02/26/18 0549 02/26/18 0816  BP: (!) 157/58 (!) 133/54  128/82  Pulse: 72 61  60  Resp: (!) 23 (!) 21  20  Temp: 98.2 F (36.8 C) 98.4 F (36.9 C)  98.6 F (37 C)  TempSrc: Oral Oral  Oral  SpO2: 95% 96%  93%  Weight:   113.5 kg   Height:        Intake/Output Summary (Last 24 hours) at 02/26/2018 0850 Last data filed at 02/26/2018 0700 Gross per 24 hour  Intake 944.1 ml  Output 2125 ml  Net -1180.9 ml   Filed Weights   02/25/18 1113 02/26/18 0549  Weight: 113.4 kg 113.5 kg    Telemetry    Sinus rhythm- Personally Reviewed  ECG    Not performed today- Personally Reviewed  Physical Exam   GEN: No acute distress.   Neck: No JVD Cardiac: RRR, no murmurs, rubs, or gallops.  Respiratory: Clear to auscultation  bilaterally. GI: Soft, nontender, non-distended  MS: No edema; No deformity. Neuro:  Nonfocal  Psych: Normal affect  Extremities: 2+ pitting edema with moderate erythema bilaterally  Labs    Chemistry Recent Labs  Lab 02/25/18 1114 02/25/18 2214 02/26/18 0235  NA 136 134* 135  K 2.6* 3.0* 3.2*  CL 88* 89* 90*  CO2 33* 31 31  GLUCOSE 197* 243* 186*  BUN 92* 89* 87*  CREATININE 2.43* 2.33* 2.25*  CALCIUM 9.6 9.1 8.9  GFRNONAA 24* 25* 26*  GFRAA 28* 29* 30*  ANIONGAP 15 14 14      Hematology Recent Labs  Lab 02/25/18 1114 02/26/18 0235  WBC 4.7 6.3  RBC 3.82* 3.53*  HGB 11.3* 10.5*  HCT 34.8* 32.0*  MCV 91.1 90.7  MCH 29.6 29.7  MCHC 32.5 32.8  RDW 16.6* 16.6*  PLT 188 164    Cardiac EnzymesNo results for input(s): TROPONINI in the last 168 hours.  Recent Labs  Lab 02/25/18 1136  TROPIPOC 0.01     BNP Recent Labs  Lab 02/24/18 1243 02/25/18 1340  BNP  --  1,809.7*  PROBNP 17,251*  --      DDimer  No results for input(s): DDIMER in the last 168 hours.   Radiology    Dg Chest 2 View  Result Date: 02/25/2018 CLINICAL DATA:  Swelling both extremities.  Prior vascular surgery. EXAM: CHEST - 2 VIEW COMPARISON:  10/11/2017. FINDINGS: Prior CABG. Cardiomegaly with mild bilateral interstitial prominence consistent CHF. No focal infiltrate. No pneumothorax. IMPRESSION: Prior CABG. Cardiomegaly with diffuse bilateral interstitial prominence consistent with CHF. Electronically Signed   By: Marcello Moores  Register   On: 02/25/2018 11:41    Cardiac Studies   2D echocardiogram (02/09/2018)  Study Conclusions  - Left ventricle: The cavity size was normal. There was moderate   concentric hypertrophy. Systolic function was mildly to   moderately reduced. The estimated ejection fraction was in the   range of 40% to 45%. Anterior hypokinesis. Grade 2 DD with high   LV filling pressure. - Aortic valve: Calcified without stenosis. Mean gradient (S): 6 mm   Hg. - Left  atrium: Moderately dilated. - Right atrium: The atrium was mildly dilated. - Inferior vena cava: The vessel was normal in size. The   respirophasic diameter changes were in the normal range (>= 50%),   consistent with normal central venous pressure.  Impressions:  - Technically difficult study. LVEF 40-45%, more significant   anterior hypokinesis, grade 2 DD, high LV filling pressure,   moderate LAE, mild RAE.  Patient Profile     [name] is a 77 y.o. male with a hx of CAD s/p CABG in 5916, chronic diastolic heart failure, bilateral carotid artery disease s/p endarterectomy, PAD with claudication s/p aortobifemoral bypass, HTN, CKD, and HLD who is being seen today for the evaluation of lower extremity swelling and worsening renal function at the request of Dr. Francia Greaves.  His oral diuretics were changed to high-dose intravenous furosemide.  His eyes Nahser -1.1 L and his serum creatinine is 2.2 with a BNP of 1800.  Assessment & Plan    1: Acute on chronic systolic and diastolic heart failure- recent 2D echo showed EF of 40 to 45% with grade 2 diastolic dysfunction.  He is only -1.1 L with an elevated serum creatinine.  We will continue to diurese him.  He may benefit either the addition of metolazone and/or intravenous inotropic therapy.  We will discuss this with our advanced heart failure team.  2: Acute on chronic kidney disease-baseline creatinine 1.3-1.4 with an increase of serum creatinine to 2.25 as result of diuresis.  He would benefit from a nephrology consult  3: Peripheral arterial disease.  Status post remote aortobifem with left common femoral endarterectomy and femoropopliteal bypass grafting.  He does appear to have cellulitis in his groin and abdomen on IV antibiotics  4: Coronary artery disease-history of remote CABG.  Patient denies chest pain but does have newly diminished ejection fraction.  Agree that he may benefit from right left heart cath when she is  adequately diuresed and his renal function stabilizes.  No changes in therapy at this time.  We will see again on Monday.     For questions or updates, please contact [place] Please consult [email or web address] for contact info under        Signed, Quay Burow, MD  02/26/2018, 8:50 AM

## 2018-02-26 NOTE — Evaluation (Signed)
Physical Therapy Evaluation Patient Details Name: [name] MRN: [id number] DOB: 11/24/1940 Today's Date: 02/26/2018   History of Present Illness  77 y.o. male, with a hx of CAD s/p CABG in 9476, chronic diastolic heart failure, bilateral carotid artery disease s/p endarterectomy, PAD with claudication s/p aortobifemoral bypass, HTN, CKD, and HLD, who presented secondary to worsening lower extremity edema and progressive dyspnea.  He also recently underwent a femoral-popliteal bypass in June.  This was done by Dr. Oneida Alar.  He was recently started on Levaquin for cellulitis around the surgical site and lower abdomen.     Clinical Impression  Pt admitted with above diagnosis. Pt currently with functional limitations due to the deficits listed below (see PT Problem List). PTA, pt participating in [place] at home with wife after returning home from SNF in august, limited community ambulation with RW. Upon eval pt is weaker than baseline, presents with tremors in extremities they report are new as of a month, balance and strength deficits. Walking in hospital room with hands on assistance, had had several falls over last 6 months. Patient has had a number of recent hospital admissions and set backs during his recovery, patient and family eager to see him return to prior level of function.  Pt will benefit from skilled PT to increase their independence and safety with mobility to allow discharge to the venue listed below.       Follow Up Recommendations CIR    Equipment Recommendations  None recommended by PT    Recommendations for Other Services       Precautions / Restrictions Precautions Precautions: Fall Restrictions Weight Bearing Restrictions: No      Mobility  Bed Mobility Overal bed mobility: Needs Assistance Bed Mobility: Supine to Sit     Supine to sit: Min assist     General bed mobility comments: Min A to power up  Transfers Overall transfer level: Needs  assistance Equipment used: Rolling walker (2 wheeled) Transfers: Sit to/from Stand Sit to Stand: Min assist         General transfer comment: Min A to Mod A from neurtal height bed to stand into RW.   Ambulation/Gait Ambulation/Gait assistance: Min guard Gait Distance (Feet): 30 Feet Assistive device: Rolling walker (2 wheeled) Gait Pattern/deviations: Step-to pattern Gait velocity: decreased   General Gait Details: small step, min guard for safety and chair follow   Stairs            Wheelchair Mobility    Modified Rankin (Stroke Patients Only)       Balance Overall balance assessment: Needs assistance   Sitting balance-Leahy Scale: Fair       Standing balance-Leahy Scale: Poor                               Pertinent Vitals/Pain Pain Assessment: No/denies pain    Home Living Family/patient expects to be discharged to:: Skilled nursing facility                      Prior Function Level of Independence: Needs assistance   Gait / Transfers Assistance Needed: RW for functional mobility     Comments: returning back home from SNF in august, [place] for household ambulation     Hand Dominance   Dominant Hand: Right    Extremity/Trunk Assessment   Upper Extremity Assessment Upper Extremity Assessment: Generalized weakness    Lower Extremity Assessment Lower Extremity  Assessment: Generalized weakness       Communication   Communication: No difficulties  Cognition Arousal/Alertness: Awake/alert Behavior During Therapy: WFL for tasks assessed/performed Overall Cognitive Status: Within Functional Limits for tasks assessed                                        General Comments      Exercises General Exercises - Lower Extremity Quad Sets: 10 reps   Assessment/Plan    PT Assessment Patient needs continued PT services  PT Problem List Decreased strength;Decreased range of motion;Decreased activity  tolerance;Decreased balance;Decreased mobility;Pain       PT Treatment Interventions DME instruction;Gait training;Stair training;Functional mobility training;Therapeutic activities;Therapeutic exercise;Balance training    PT Goals (Current goals can be found in the Care Plan section)  Acute Rehab PT Goals Patient Stated Goal: get stroner PT Goal Formulation: With patient/family Time For Goal Achievement: 03/12/18 Potential to Achieve Goals: Good    Frequency Min 3X/week   Barriers to discharge        Co-evaluation               AM-PAC PT "6 Clicks" Daily Activity  Outcome Measure Difficulty turning over in bed (including adjusting bedclothes, sheets and blankets)?: Unable Difficulty moving from lying on back to sitting on the side of the bed? : Unable Difficulty sitting down on and standing up from a chair with arms (e.g., wheelchair, bedside commode, etc,.)?: Unable Help needed moving to and from a bed to chair (including a wheelchair)?: A Lot Help needed walking in hospital room?: A Lot Help needed climbing 3-5 steps with a railing? : Total 6 Click Score: 8    End of Session Equipment Utilized During Treatment: Gait belt Activity Tolerance: Patient tolerated treatment well Patient left: with call bell/phone within reach;in chair;with family/visitor present Nurse Communication: Mobility status PT Visit Diagnosis: Unsteadiness on feet (R26.81);Other abnormalities of gait and mobility (R26.89);Muscle weakness (generalized) (M62.81);Repeated falls (R29.6);Difficulty in walking, not elsewhere classified (R26.2)    Time: 4854-6270 PT Time Calculation (min) (ACUTE ONLY): 43 min   Charges:   PT Evaluation $PT Eval Moderate Complexity: 1 Mod PT Treatments $Gait Training: 8-22 mins        Reinaldo Berber, PT, DPT Acute Rehabilitation Services Pager: [phone] Office: [phone]    Reinaldo Berber 02/26/2018, 5:24 PM

## 2018-02-26 NOTE — Progress Notes (Signed)
Initial Nutrition Assessment  DOCUMENTATION CODES:   Non-severe (moderate) malnutrition in context of chronic illness  INTERVENTION:   Atkins Protein Shakes (wife to bring in for patient); each provides 160 kcals, 15 g of protein  D/C Ensure Enlive po BID, each supplement provides 350 kcal and 20 grams of protein  Continue Daily MVI with minerals  All questions answered regarding the heart failure, low sodium diet. Very receptive to education  NUTRITION DIAGNOSIS:   Moderate Malnutrition related to chronic illness(chronic wound, CHF, CKD) as evidenced by mild fat depletion, mild muscle depletion.  GOAL:   Patient will meet greater than or equal to 90% of their needs  MONITOR:   PO intake, Labs, Supplement acceptance, Weight trends, Skin  REASON FOR ASSESSMENT:   Malnutrition Screening Tool    ASSESSMENT:   77 yo male admitted with acute on chronic systolic CHF, AKI on CKD III, cellulitis of LLQ with left groin wound. PMH includes CAD s/p CABG, CHF, b/l carotid artery disease s/p endarterectomy, PAD,HTN, CKD, HLD  Pt ate 100% at dinner last night. Pt ate 100% at breakfast this AM as well. Pt reports very good appetite; has been eating well at home, 3 meals per day. Pt also supplementing diet with Atkins Protein shake. Wife has brought this in for pt and pt prefers this to Ensure or other supplements. Pt has also tried Pro-Stat which he does not like Noted pt with poor appetite, not eating well on previous admission in June/July in which pt reports he was in hospital for 6 weeks.   Pt with chronic cellulitis of LLQ with left groin wound; noted pt admitted in June of this year with poorly healing left groin wound post left femoral to below-knee popliteal bypass. During that admission, wound was debrided and wound vac placed. Wound and cellulitis have improved since then; almost healed per wife.   Pt and his wife are unsure if he has experienced in weight changes that aren't  related to fluid status. Per weight encounters, weight is trending down. Current wt 113.5 kg even with significant b/l LE edema. Noted weight of 116 kg back in July/September. <3% weight loss which is not significant for time frame. However, do not know current dry weight.   Discussed low sodium, heart failure diet as pt and his wife had several questions. Pt has been educated previously and is very knowledgeable but did had some questions. Pt does not like the low sodium diet but is adjusting. Discussed different ways of seasoning food to make it more appealing to him without adding salt/sodium. Pt and his wife very receptive, adherence likely  Labs: Creatinine 2.25, BUN 87, potassium 3.2, sodium wdl, CBGs 137-225 Meds: lasix drip, fenofibrate, MVI, KCl, vitamin B12  NUTRITION - FOCUSED PHYSICAL EXAM:    Most Recent Value  Orbital Region  Mild depletion  Upper Arm Region  Mild depletion  Thoracic and Lumbar Region  No depletion  Buccal Region  Mild depletion  Temple Region  Mild depletion  Clavicle Bone Region  Moderate depletion  Clavicle and Acromion Bone Region  Mild depletion  Scapular Bone Region  Mild depletion  Dorsal Hand  Severe depletion  Patellar Region  Unable to assess [severe edema]  Anterior Thigh Region  Unable to assess [severe edema]  Posterior Calf Region  Unable to assess [severe edema]  Edema (RD Assessment)  Severe       Diet Order:   Diet Order  Diet heart healthy/carb modified Room service appropriate? Yes; Fluid consistency: Thin; Fluid restriction: 1500 mL Fluid  Diet effective now              EDUCATION NEEDS:   Education needs have been addressed  Skin:  Skin Assessment: Skin Integrity Issues: Skin Integrity Issues:: Other (Comment) Other: cellulitis of LLQ with chronic left groin wound  Last BM:  10/2  Height:   Ht Readings from Last 1 Encounters:  02/25/18 5\' 11"  (1.803 m)    Weight:   Wt Readings from Last 1 Encounters:   02/26/18 113.5 kg    Ideal Body Weight:  78.2 kg  BMI:  Body mass index is 34.9 kg/m.  Estimated Nutritional Needs:   Kcal:  2000-2200 kcals   Protein:  115-140 g  Fluid:  1.5 L   Kerman Passey MS, RD, LDN, CNSC [phone] Pager  [phone] Weekend/On-Call Pager

## 2018-02-26 NOTE — Progress Notes (Signed)
[facility] PROGRESS NOTE  [name] RJJ:[id number] DOB: Mar 04, 1941 DOA: 02/25/2018  PCP: Lavone Orn, MD  Brief History/Interval Summary: 77 y.o. male, with a hx of CAD s/p CABG in 0160, chronic diastolic heart failure, bilateral carotid artery disease s/p endarterectomy, PAD with claudication s/p aortobifemoral bypass, HTN, CKD, and HLD, who presented secondary to worsening lower extremity edema and progressive dyspnea.  He also recently underwent a femoral-popliteal bypass in June.  This was done by Dr. Oneida Alar.  He was recently started on Levaquin for cellulitis around the surgical site and lower abdomen.   Reason for Visit: Acute diastolic CHF  Consultants: Cardiology.  Vascular surgery.  Procedures:   Lower extremity Doppler study Final Interpretation: Right: There is no evidence of deep vein thrombosis in the lower extremity. However, portions of this examination were limited- see technologist comments above. No cystic structure found in the popliteal fossa. Left: Findings consistent with acute superficial vein thrombosis involving the left great saphenous vein. Portions of this examination were limited- see technologist comments above. No cystic structure found in the popliteal fossa.  Antibiotics: Cefazolin  Subjective/Interval History: Patient denies any improvement in symptoms.  Still has symptoms suggestive of orthopnea.  Denies any chest pain.  Admits to a lot of swelling in his legs.  The redness around the lower abdomen appears to have improved slightly.  ROS: Denies any nausea or vomiting  Objective:  Vital Signs  Vitals:   02/25/18 2341 02/26/18 0321 02/26/18 0549 02/26/18 0816  BP: (!) 157/58 (!) 133/54  128/82  Pulse: 72 61  60  Resp: (!) 23 (!) 21  20  Temp: 98.2 F (36.8 C) 98.4 F (36.9 C)  98.6 F (37 C)  TempSrc: Oral Oral  Oral  SpO2: 95% 96%  93%  Weight:   113.5 kg   Height:        Intake/Output Summary (Last 24 hours) at  02/26/2018 1031 Last data filed at 02/26/2018 0900 Gross per 24 hour  Intake 1184.1 ml  Output 2125 ml  Net -940.9 ml   Filed Weights   02/25/18 1113 02/26/18 0549  Weight: 113.4 kg 113.5 kg    General appearance: alert, cooperative, appears stated age and no distress Head: Normocephalic, without obvious abnormality, atraumatic Resp: Mildly tachypneic at rest.  Diminished air entry at the bases with a few crackles.  No wheezing or rhonchi. Cardio: regular rate and rhythm, S1, S2 normal, no murmur, click, rub or gallop GI: soft, non-tender; bowel sounds normal; no masses,  no organomegaly Extremities: 2+ pitting edema bilateral lower extremities.  Erythema is noted on the left lower extremity at the surgical site.  Also in the left lower leg.  Some warmth to touch.  Good peripheral pulses. Neurologic: Alert and oriented x3.  Cranial nerves II to XII intact.  Motor strength equal bilateral upper and lower extremities.  Lab Results:  Data Reviewed: I have personally reviewed following labs and imaging studies  CBC: Recent Labs  Lab 02/25/18 1114 02/26/18 0235  WBC 4.7 6.3  HGB 11.3* 10.5*  HCT 34.8* 32.0*  MCV 91.1 90.7  PLT 188 109    Basic Metabolic Panel: Recent Labs  Lab 02/24/18 1243 02/25/18 1114 02/25/18 2214 02/26/18 0235  NA 136 136 134* 135  K 3.3* 2.6* 3.0* 3.2*  CL 86* 88* 89* 90*  CO2 29 33* 31 31  GLUCOSE 152* 197* 243* 186*  BUN 86* 92* 89* 87*  CREATININE 2.37* 2.43* 2.33* 2.25*  CALCIUM 9.7 9.6  9.1 8.9    GFR: Estimated Creatinine Clearance: 34.7 mL/min (A) (by C-G formula based on SCr of 2.25 mg/dL (H)).  BNP (last 3 results) Recent Labs    02/03/18 1217 02/24/18 1243  PROBNP 10,666* 17,251*    CBG: Recent Labs  Lab 02/25/18 2116 02/26/18 0813  GLUCAP 225* 137*     Recent Results (from the past 240 hour(s))  Culture, blood (routine x 2)     Status: None (Preliminary result)   Collection Time: 02/25/18  1:40 PM  Result Value Ref  Range Status   Specimen Description BLOOD SITE NOT SPECIFIED  Final   Special Requests   Final    BOTTLES DRAWN AEROBIC AND ANAEROBIC Blood Culture adequate volume   Culture   Final    NO GROWTH <12 HOURS Performed at [facility], [place] [address] [place], [place] [address]    Report Status PENDING  Incomplete  MRSA PCR Screening     Status: None   Collection Time: 02/25/18  6:59 PM  Result Value Ref Range Status   MRSA by PCR NEGATIVE NEGATIVE Final    Comment:        The GeneXpert MRSA Assay (FDA approved for NASAL specimens only), is one component of a comprehensive MRSA colonization surveillance program. It is not intended to diagnose MRSA infection nor to guide or monitor treatment for MRSA infections. Performed at [facility], [place] [address], [place], [place] [address]       Radiology Studies: Dg Chest 2 View  Result Date: 02/25/2018 CLINICAL DATA:  Swelling both extremities.  Prior vascular surgery. EXAM: CHEST - 2 VIEW COMPARISON:  10/11/2017. FINDINGS: Prior CABG. Cardiomegaly with mild bilateral interstitial prominence consistent CHF. No focal infiltrate. No pneumothorax. IMPRESSION: Prior CABG. Cardiomegaly with diffuse bilateral interstitial prominence consistent with CHF. Electronically Signed   By: Marcello Moores  Register   On: 02/25/2018 11:41     Medications:  Scheduled: . ascorbic acid  500 mg Oral BID  . aspirin EC  81 mg Oral Daily  . atorvastatin  20 mg Oral QPM  . carvedilol  12.5 mg Oral BID  . feeding supplement (ENSURE ENLIVE)  237 mL Oral BID BM  . fenofibrate  54 mg Oral Daily  . gabapentin  300 mg Oral BID  . heparin  5,000 Units Subcutaneous Q8H  . insulin aspart  0-9 Units Subcutaneous TID WC  . levothyroxine  100 mcg Oral QAC breakfast  . mouth rinse  15 mL Mouth Rinse BID  . multivitamin with minerals  1 tablet Oral Daily  . potassium chloride SA  20 mEq Oral Daily  . tamsulosin  0.4 mg Oral QODAY  . vitamin B-12  500 mcg Oral  Daily   Continuous: . furosemide 120 mg (02/26/18 1001)   UJW:[id number], polyvinyl alcohol, sodium chloride, temazepam  Assessment/Plan:    Acute on chronic systolic and diastolic CHF Most recent echocardiogram done on 02/09/2018 showed a EF of 40 to 45% with grade 2 diastolic dysfunction.  Patient with significant lower extremity edema.  Patient also with symptoms of orthopnea.  Patient started on intravenous diuretics.  Seems to be diuresing well.  Weight has not decreased significantly yet.  Symptoms have not improved much.  Continue current treatment for now.  No ACE inhibitor or ARB due to chronic kidney disease.  Strict ins and outs and daily weights.  Cardiology is following.  Acute on chronic kidney disease stage III Appears that his creatinine was mostly around  1.5 up until September of this year when it has been mostly around 2.  Creatinine was noted to be significantly higher than his baseline at admission.  Most likely due to cardiorenal syndrome.  Creatinine slightly better this morning.  Continue to watch urine output as he is getting diuresed.  Hypokalemia Replace potassium this morning.  Monitor electrolytes closely as he is getting diuresed.  Peripheral artery disease Followed by vascular surgery.  Recent femoropopliteal bypass.  Good pulses on the left lower extremity.  Cellulitis involving lower abdomen and left lower extremity Patient is on cefazolin which will be continued.  The erythema in the lower abdomen appears to be slightly better according to patient.  It appears that the patient received vancomycin in the emergency department.  His MRSA PCR was negative.  Doppler study was done which showed acute superficial vein thrombosis involving the left great saphenous vein.  Symptomatic treatment.  Coronary artery disease Appears to be stable.  Continue with aspirin.  No chest pain.  Essential hypertension Monitor blood pressures closely.  Diabetes mellitus type  2 Holding Actos.  This will likely need to be discontinued considering his CHF.  Continue with SSI.  History of prolonged QTC Replace potassium.  He was given magnesium yesterday.  Normocytic anemia Likely due to chronic disease.  Check anemia panel in the morning.  No evidence for overt bleeding.  Hypothyroidism Continue with levothyroxine.  TSH was 4.5 on September 12.  DVT Prophylaxis: Subcutaneous heparin    Code Status: Full code Family Communication: Discussed with the patient Disposition Plan: Management as outlined above.  Start mobilizing.    LOS: 1 day   [facility] Pager [phone] 02/26/2018, 10:31 AM  If 7PM-7AM, please contact night-coverage at [email or web address], password [facility]

## 2018-02-27 DIAGNOSIS — I480 Paroxysmal atrial fibrillation: Secondary | ICD-10-CM

## 2018-02-27 DIAGNOSIS — E440 Moderate protein-calorie malnutrition: Secondary | ICD-10-CM

## 2018-02-27 LAB — BASIC METABOLIC PANEL
ANION GAP: 16 — AB (ref 5–15)
Anion gap: 13 (ref 5–15)
BUN: 82 mg/dL — ABNORMAL HIGH (ref 8–23)
BUN: 77 mg/dL — AB (ref 8–23)
CALCIUM: 9.2 mg/dL (ref 8.9–10.3)
CHLORIDE: 88 mmol/L — AB (ref 98–111)
CO2: 29 mmol/L (ref 22–32)
CO2: 34 mmol/L — AB (ref 22–32)
CREATININE: 2.11 mg/dL — AB (ref 0.61–1.24)
Calcium: 9.2 mg/dL (ref 8.9–10.3)
Chloride: 91 mmol/L — ABNORMAL LOW (ref 98–111)
Creatinine, Ser: 2.26 mg/dL — ABNORMAL HIGH (ref 0.61–1.24)
GFR calc Af Amer: 33 mL/min — ABNORMAL LOW (ref >60)
GFR calc non Af Amer: 28 mL/min — ABNORMAL LOW (ref >60)
GFR, EST AFRICAN AMERICAN: 30 mL/min — AB (ref >60)
GFR, EST NON AFRICAN AMERICAN: 26 mL/min — AB (ref >60)
GLUCOSE: 133 mg/dL — AB (ref 70–99)
GLUCOSE: 163 mg/dL — AB (ref 70–99)
POTASSIUM: 2.6 mmol/L — AB (ref 3.5–5.1)
Potassium: 3.6 mmol/L (ref 3.5–5.1)
Sodium: 136 mmol/L (ref 135–145)
Sodium: 135 mmol/L (ref 135–145)

## 2018-02-27 LAB — CBC
HEMATOCRIT: 32.7 % — AB (ref 39.0–52.0)
HEMOGLOBIN: 10.5 g/dL — AB (ref 13.0–17.0)
MCH: 29.8 pg (ref 26.0–34.0)
MCHC: 32.1 g/dL (ref 30.0–36.0)
MCV: 92.9 fL (ref 78.0–100.0)
Platelets: 185 10*3/uL (ref 150–400)
RBC: 3.52 MIL/uL — ABNORMAL LOW (ref 4.22–5.81)
RDW: 16.7 % — AB (ref 11.5–15.5)
WBC: 4.1 10*3/uL (ref 4.0–10.5)

## 2018-02-27 LAB — IRON AND TIBC
IRON: 28 ug/dL — AB (ref 45–182)
SATURATION RATIOS: 6 % — AB (ref 17.9–39.5)
TIBC: 440 ug/dL (ref 250–450)
UIBC: 412 ug/dL

## 2018-02-27 LAB — FOLATE: Folate: 19.4 ng/mL (ref >5.9)

## 2018-02-27 LAB — GLUCOSE, CAPILLARY
GLUCOSE-CAPILLARY: 198 mg/dL — AB (ref 70–99)
Glucose-Capillary: 101 mg/dL — ABNORMAL HIGH (ref 70–99)
Glucose-Capillary: 186 mg/dL — ABNORMAL HIGH (ref 70–99)
Glucose-Capillary: 189 mg/dL — ABNORMAL HIGH (ref 70–99)

## 2018-02-27 LAB — RETICULOCYTES
RBC.: 3.52 MIL/uL — ABNORMAL LOW (ref 4.22–5.81)
RETIC COUNT ABSOLUTE: 70.4 10*3/uL (ref 19.0–186.0)
Retic Ct Pct: 2 % (ref 0.4–3.1)

## 2018-02-27 LAB — VITAMIN B12: Vitamin B-12: 1106 pg/mL — ABNORMAL HIGH (ref 180–914)

## 2018-02-27 LAB — MAGNESIUM: Magnesium: 2.3 mg/dL (ref 1.7–2.4)

## 2018-02-27 LAB — FERRITIN: Ferritin: 76 ng/mL (ref 24–336)

## 2018-02-27 MED ORDER — POTASSIUM CHLORIDE CRYS ER 20 MEQ PO TBCR
40.0000 meq | EXTENDED_RELEASE_TABLET | Freq: Once | ORAL | Status: AC
  Administered 2018-02-27: 40 meq via ORAL
  Filled 2018-02-27: qty 2

## 2018-02-27 MED ORDER — APIXABAN 5 MG PO TABS
5.0000 mg | ORAL_TABLET | Freq: Two times a day (BID) | ORAL | Status: DC
  Administered 2018-02-27 – 2018-03-03 (×9): 5 mg via ORAL
  Filled 2018-02-27 (×9): qty 1

## 2018-02-27 MED ORDER — POTASSIUM CHLORIDE 10 MEQ/100ML IV SOLN
10.0000 meq | INTRAVENOUS | Status: AC
  Administered 2018-02-27 (×4): 10 meq via INTRAVENOUS
  Filled 2018-02-27 (×4): qty 100

## 2018-02-27 MED ORDER — CARVEDILOL 25 MG PO TABS
25.0000 mg | ORAL_TABLET | Freq: Two times a day (BID) | ORAL | Status: DC
  Administered 2018-02-27 – 2018-03-03 (×9): 25 mg via ORAL
  Filled 2018-02-27 (×9): qty 1

## 2018-02-27 NOTE — Progress Notes (Addendum)
[facility] PROGRESS NOTE  COOPER MORONEY ZYS:[id number] DOB: 1940-11-16 DOA: 02/25/2018  PCP: Lavone Orn, MD  Brief History/Interval Summary: 77 y.o. male, with a hx of CAD s/p CABG in 9323, chronic diastolic heart failure, bilateral carotid artery disease s/p endarterectomy, PAD with claudication s/p aortobifemoral bypass, HTN, CKD, and HLD, who presented secondary to worsening lower extremity edema and progressive dyspnea.  He also recently underwent a femoral-popliteal bypass in June.  This was done by Dr. Oneida Alar.  He was recently started on Levaquin for cellulitis around the surgical site and lower abdomen.  Patient was admitted to the hospital.  Placed on IV diuretics.  Placed on IV antibiotics.  Reason for Visit: Acute diastolic CHF  Consultants: Cardiology.  Vascular surgery.  Procedures:   Lower extremity Doppler study Final Interpretation: Right: There is no evidence of deep vein thrombosis in the lower extremity. However, portions of this examination were limited- see technologist comments above. No cystic structure found in the popliteal fossa. Left: Findings consistent with acute superficial vein thrombosis involving the left great saphenous vein. Portions of this examination were limited- see technologist comments above. No cystic structure found in the popliteal fossa.  Antibiotics: Cefazolin  Subjective/Interval History: Patient states that he feels lousy.  Mainly concerned about the pain with the IV potassium infusion.  Denies any improvement in his breathing although he looks more comfortable.  Denies any chest pain.  Patient concerned about tremors and jerking movement of his upper extremity especially the right.  Has been ongoing for at least a month.  ROS: Denies any nausea or vomiting  Objective:  Vital Signs  Vitals:   02/26/18 2153 02/26/18 2225 02/26/18 2332 02/27/18 0417  BP: (!) 114/56 114/83 (!) 148/47 (!) 165/55  Pulse: (!) 57 (!) 53 (!) 59  89  Resp: 15  (!) 21 16  Temp:   97.6 F (36.4 C) 97.9 F (36.6 C)  TempSrc:   Oral Oral  SpO2: 97%  93% 95%  Weight:    112.2 kg  Height:        Intake/Output Summary (Last 24 hours) at 02/27/2018 0832 Last data filed at 02/27/2018 0800 Gross per 24 hour  Intake 840 ml  Output 3850 ml  Net -3010 ml   Filed Weights   02/25/18 1113 02/26/18 0549 02/27/18 0417  Weight: 113.4 kg 113.5 kg 112.2 kg    General appearance: Awake alert.  In no distress Resp: Mildly tachypneic at rest.  Diminished air entry at the bases.  Few crackles present.  No wheezing or rhonchi.   Cardio: S1-S2 is irregularly irregular.  Regular.  No S3-S4.  No rubs murmurs or bruit.  Telemetry shows atrial fibrillation. GI: Abdomen soft.  Nontender nondistended.  Bowel sounds are present.  No masses organomegaly Extremities: Continues to have pitting edema bilateral lower extremities though they appear to be less swollen compared to yesterday.  Erythema in the lower abdominal wall area has improved.  Erythema in the left lower extremity has also improved.   Neurologic: Remains awake and alert.  No focal neurological deficits appreciated.    Lab Results:  Data Reviewed: I have personally reviewed following labs and imaging studies  CBC: Recent Labs  Lab 02/25/18 1114 02/26/18 0235 02/27/18 0301  WBC 4.7 6.3 4.1  HGB 11.3* 10.5* 10.5*  HCT 34.8* 32.0* 32.7*  MCV 91.1 90.7 92.9  PLT 188 164 557    Basic Metabolic Panel: Recent Labs  Lab 02/24/18 1243 02/25/18 1114 02/25/18 2214 02/26/18  0235 02/27/18 0301  NA 136 136 134* 135 136  K 3.3* 2.6* 3.0* 3.2* 2.6*  CL 86* 88* 89* 90* 91*  CO2 29 33* 31 31 29   GLUCOSE 152* 197* 243* 186* 133*  BUN 86* 92* 89* 87* 82*  CREATININE 2.37* 2.43* 2.33* 2.25* 2.26*  CALCIUM 9.7 9.6 9.1 8.9 9.2    GFR: Estimated Creatinine Clearance: 34.3 mL/min (A) (by C-G formula based on SCr of 2.26 mg/dL (H)).  BNP (last 3 results) Recent Labs    02/03/18 1217  02/24/18 1243  PROBNP 10,666* 17,251*    CBG: Recent Labs  Lab 02/25/18 2116 02/26/18 0813 02/26/18 1150 02/26/18 1536 02/26/18 2113  GLUCAP 225* 137* 170* 160* 155*     Recent Results (from the past 240 hour(s))  Culture, blood (routine x 2)     Status: None (Preliminary result)   Collection Time: 02/25/18  1:40 PM  Result Value Ref Range Status   Specimen Description BLOOD SITE NOT SPECIFIED  Final   Special Requests   Final    BOTTLES DRAWN AEROBIC AND ANAEROBIC Blood Culture adequate volume   Culture   Final    NO GROWTH 1 DAY Performed at [facility], [place] [address], [place], [place] [address]    Report Status PENDING  Incomplete  MRSA PCR Screening     Status: None   Collection Time: 02/25/18  6:59 PM  Result Value Ref Range Status   MRSA by PCR NEGATIVE NEGATIVE Final    Comment:        The GeneXpert MRSA Assay (FDA approved for NASAL specimens only), is one component of a comprehensive MRSA colonization surveillance program. It is not intended to diagnose MRSA infection nor to guide or monitor treatment for MRSA infections. Performed at [facility], [place] [address] [place], [place] [address]       Radiology Studies: Dg Chest 2 View  Result Date: 02/25/2018 CLINICAL DATA:  Swelling both extremities.  Prior vascular surgery. EXAM: CHEST - 2 VIEW COMPARISON:  10/11/2017. FINDINGS: Prior CABG. Cardiomegaly with mild bilateral interstitial prominence consistent CHF. No focal infiltrate. No pneumothorax. IMPRESSION: Prior CABG. Cardiomegaly with diffuse bilateral interstitial prominence consistent with CHF. Electronically Signed   By: Marcello Moores  Register   On: 02/25/2018 11:41     Medications:  Scheduled: . ascorbic acid  500 mg Oral BID  . aspirin EC  81 mg Oral Daily  . atorvastatin  20 mg Oral QPM  . carvedilol  25 mg Oral BID  . feeding supplement (ENSURE ENLIVE)  237 mL Oral BID BM  . fenofibrate  54 mg Oral Daily  . gabapentin  300 mg  Oral BID  . heparin  5,000 Units Subcutaneous Q8H  . insulin aspart  0-9 Units Subcutaneous TID WC  . levothyroxine  100 mcg Oral QAC breakfast  . mouth rinse  15 mL Mouth Rinse BID  . multivitamin with minerals  1 tablet Oral Daily  . potassium chloride  40 mEq Oral Once  . tamsulosin  0.4 mg Oral QODAY  . vitamin B-12  500 mcg Oral Daily   Continuous: .  ceFAZolin (ANCEF) IV 1 g (02/27/18 0523)  . furosemide 120 mg (02/26/18 1933)  . potassium chloride 10 mEq (02/27/18 0759)   RKY:[id number], polyvinyl alcohol, sodium chloride, temazepam  Assessment/Plan:    Acute on chronic systolic and diastolic CHF Most recent echocardiogram done on 02/09/2018 showed a EF of 40 to 45% with grade 2 diastolic dysfunction.  Patient with significant lower extremity edema.  Patient also with symptoms of orthopnea.  Patient started on intravenous diuretics.  Patient is diuresing well.  His weight is lower today compared to yesterday.  From a symptom standpoint he has not improved.  Continue IV diuretics for 1 more day.  No ACE inhibitor or ARB due to chronic kidney disease.  We will repeat another chest x-ray tomorrow morning.  Etiology continues to follow.  Renal function remains stable on high-dose diuretics.  Acute on chronic kidney disease stage III Appears that his creatinine was mostly around 1.5 up until September of this year when it has been mostly around 2.  Creatinine was noted to be significantly higher than his baseline at admission.  Most likely due to cardiorenal syndrome.  Patient is stable.  Continue to monitor renal function as he is getting diuresed.  High-dose diuretics for another 24 hours.    New Onset Atrial Fibrillation Has been noted to be in atrial fibrillation.  Seen by cardiology.  Recommending increasing the dose of carvedilol which has been done.  Eliquis per pharmacy.  Echocardiogram was done recently in September.  No clear reason to repeat.  TSH was 4.5 in September.  No  reason to repeat.  Hypokalemia Potassium noted to be extremely low this morning.  He is being aggressively repleted.  Recheck potassium level this afternoon.  Check magnesium as well.  Peripheral artery disease Followed by vascular surgery.  Recent femoropopliteal bypass.  Good pulses on the left lower extremity.  Cellulitis involving lower abdomen and left lower extremity Patient was noted to have cellulitis involving lower abdomen and left lower extremity at the site of his surgery.  He was given vancomycin in the ED.  He was continued on cefazolin.  Erythema especially in the left lower abdomen has improved.  Erythema is also improved in the left lower extremity.  Continue IV antibiotics for now.  MRSA PCR was negative.  Doppler study was done which showed acute superficial vein thrombosis involving the left great saphenous vein.  Symptomatic treatment.  Tremors/questionable myoclonic jerks Has been ongoing for a month according to the patient.  Etiology is unclear.  Patient followed by neurology, Dr. Posey Pronto, as an outpatient for neuropathy and memory impairment.  Underwent neuropsychological evaluation in August which suggested mild cognitive impairment thought to be vascular in etiology.  MRI of his brain done in April showed atrophy.  No acute findings were noted.  Patient currently does not have any focal neurological deficits.  We will continue to monitor him.  He would likely benefit from following up with his neurologist for these tremors.  Coronary artery disease Maintenance is stable.  No chest pain.  Continue with aspirin and statin.  Patient is on a beta-blocker.  Essential hypertension Monitor blood pressures closely.  Blood pressure is reasonably well controlled.  Diabetes mellitus type 2 Holding Actos.  This will need to be discontinued considering his CHF.  Continue with SSI.  History of prolonged QTC Replace electrolytes as discussed above.  Monitor on  telemetry.  Normocytic anemia Likely due to chronic disease.  Hemoglobin remains stable.  Anemia panel reviewed.  Ferritin 76.  Folate 19.4.  TIBC 440.  Vitamin B12 1106. No evidence for overt bleeding.  Hypothyroidism Continue with levothyroxine.  TSH was 4.5 on September 12.  DVT Prophylaxis: To be started on apixaban Code Status: Full code Family Communication: Discussed with the patient Disposition Plan: Management as outlined above.  Seen by physical and Occupational Therapy.  CIR is recommended.  Rehab consult will be placed.    LOS: 2 days   [facility] Pager [phone] 02/27/2018, 8:32 AM  If 7PM-7AM, please contact night-coverage at [email or web address], password [facility]

## 2018-02-27 NOTE — Progress Notes (Signed)
ANTICOAGULATION CONSULT NOTE - Initial Consult  Pharmacy Consult for Apixaban Indication: atrial fibrillation  Allergies  Allergen Reactions  . Amoxicillin-Pot Clavulanate Nausea Only    Has patient had a PCN reaction causing immediate rash, facial/tongue/throat swelling, SOB or lightheadedness with hypotension: No Has patient had a PCN reaction causing severe rash involving mucus membranes or skin necrosis: No Has patient had a PCN reaction that required hospitalization: No Has patient had a PCN reaction occurring within the last 10 years: No If all of the above answers are "NO", then may proceed with Cephalosporin use.   Marland Kitchen Morphine And Related Other (See Comments)    hallucinates    Patient Measurements: Height: 5\' 11"  (180.3 cm) Weight: 247 lb 5.7 oz (112.2 kg) IBW/kg (Calculated) : 75.3   Vital Signs: Temp: 97.9 F (36.6 C) (10/06 0417) Temp Source: Oral (10/06 0417) BP: 165/55 (10/06 0417) Pulse Rate: 89 (10/06 0417)  Labs: Recent Labs    02/25/18 1114 02/25/18 2214 02/26/18 0235 02/27/18 0301  HGB 11.3*  --  10.5* 10.5*  HCT 34.8*  --  32.0* 32.7*  PLT 188  --  164 185  CREATININE 2.43* 2.33* 2.25* 2.26*    Estimated Creatinine Clearance: 34.3 mL/min (A) (by C-G formula based on SCr of 2.26 mg/dL (H)).   Medical History: Past Medical History:  Diagnosis Date  . Arthritis   . Atherosclerotic heart disease   . Back pain   . Bruises easily    takes Pletal daily and ASA 325mg  daily  . Cataract immature    right  . Chest pain   . Colon polyps   . Complication of anesthesia    hard to wake up with bypass surgery  . Cyst    on perineum;takes Doxycycline daily  . Diabetes mellitus    takes Glipizide,Metformin,and Actos daily  . Diarrhea   . Dyspnea    when lying flat  . Edema    right leg knee down swollen since fall 3 wks ago  . Foot drop, left   . GERD (gastroesophageal reflux disease)    Rolaids as needed-occ reflux  . Hemorrhoids   . History  of kidney stones    at age 1   . Hyperlipidemia    takes Tricor and Lipitor daily  . Hypertension    takes Altace,Amlodipine,and Nadolol daily  . Hypothyroidism   . Kidney stone    35+yrs ago  . Muscle pain   . Nasal polyps    hx of  . Peripheral edema    wears knee length hose-told by podiatrist to wear these  . Peripheral neuropathy    takes Gabapentin daily  . Pneumonia    as a child  . PVD (peripheral vascular disease) ([place])   . Renal insufficiency   . Seasonal allergies    takes Mucinex and Zyrtec prn  . Urinary frequency    urgency/takes Vesicare daily    Medications:  Scheduled:  . ascorbic acid  500 mg Oral BID  . aspirin EC  81 mg Oral Daily  . atorvastatin  20 mg Oral QPM  . carvedilol  25 mg Oral BID  . feeding supplement (ENSURE ENLIVE)  237 mL Oral BID BM  . fenofibrate  54 mg Oral Daily  . gabapentin  300 mg Oral BID  . heparin  5,000 Units Subcutaneous Q8H  . insulin aspart  0-9 Units Subcutaneous TID WC  . levothyroxine  100 mcg Oral QAC breakfast  . mouth rinse  15 mL  Mouth Rinse BID  . multivitamin with minerals  1 tablet Oral Daily  . potassium chloride  40 mEq Oral Once  . tamsulosin  0.4 mg Oral QODAY  . vitamin B-12  500 mcg Oral Daily   Infusions:  .  ceFAZolin (ANCEF) IV 1 g (02/27/18 0523)  . furosemide 120 mg (02/26/18 1933)  . potassium chloride 10 mEq (02/27/18 0759)    Assessment: Pt is a 77 year old with new onset atrial fibrillation. Pharmacy consulted to start apixaban. Given age, weight, serum creatinine, no dosage adjustment needed. No pertinent drug interaction noted. Hemoglobin stable.    Plan:  Start apixaban 5 mg twice daily.   Thank you for allowing pharmacy to participate in the care of this patient.  Claiborne Billings, PharmD PGY2 Cardiology Pharmacy Resident Phone [phone] Please check AMION for all Pharmacist numbers by unit 02/27/2018 8:48 AM

## 2018-02-27 NOTE — Progress Notes (Addendum)
Md paged EKG obtained and made aware of pt in Afib with rate 90-100, asymptomatic.  Potassium 2.6 B12 1,106. Will continue to monitor [name] T

## 2018-02-27 NOTE — Progress Notes (Signed)
Inpatient Rehabilitation  Per PT request, patient was screened by Gunnar Fusi for appropriateness for an Inpatient Acute Rehab consult.  Note that patient currently at a Min guard gait assist level; however, given his history of falls and multiple admission we are recommending an Inpatient Rehab consult.  Please order if you are agreeable.    Carmelia Roller., CCC/SLP Admission Coordinator  [facility]  Cell [phone]

## 2018-02-27 NOTE — Progress Notes (Signed)
Progress Note  Patient Name: [name] Date of Encounter: 02/27/2018  Primary Cardiologist: Sinclair Grooms, MD   Subjective   Mr. Tschantz has improvement in her shortness of breath.  His lower extremity edema is markedly improved.  He is negative over 4 L.  He did go into A. fib last night however.  Inpatient Medications    Scheduled Meds: . ascorbic acid  500 mg Oral BID  . aspirin EC  81 mg Oral Daily  . atorvastatin  20 mg Oral QPM  . carvedilol  25 mg Oral BID  . feeding supplement (ENSURE ENLIVE)  237 mL Oral BID BM  . fenofibrate  54 mg Oral Daily  . gabapentin  300 mg Oral BID  . heparin  5,000 Units Subcutaneous Q8H  . insulin aspart  0-9 Units Subcutaneous TID WC  . levothyroxine  100 mcg Oral QAC breakfast  . mouth rinse  15 mL Mouth Rinse BID  . multivitamin with minerals  1 tablet Oral Daily  . potassium chloride  40 mEq Oral Once  . tamsulosin  0.4 mg Oral QODAY  . vitamin B-12  500 mcg Oral Daily   Continuous Infusions: .  ceFAZolin (ANCEF) IV 1 g (02/27/18 0523)  . furosemide 120 mg (02/26/18 1933)  . potassium chloride 10 mEq (02/27/18 0759)   PRN Meds: acetaminophen, polyvinyl alcohol, sodium chloride, temazepam   Vital Signs    Vitals:   02/26/18 2153 02/26/18 2225 02/26/18 2332 02/27/18 0417  BP: (!) 114/56 114/83 (!) 148/47 (!) 165/55  Pulse: (!) 57 (!) 53 (!) 59 89  Resp: 15  (!) 21 16  Temp:   97.6 F (36.4 C) 97.9 F (36.6 C)  TempSrc:   Oral Oral  SpO2: 97%  93% 95%  Weight:    112.2 kg  Height:        Intake/Output Summary (Last 24 hours) at 02/27/2018 0840 Last data filed at 02/27/2018 0800 Gross per 24 hour  Intake 840 ml  Output 3850 ml  Net -3010 ml   Filed Weights   02/25/18 1113 02/26/18 0549 02/27/18 0417  Weight: 113.4 kg 113.5 kg 112.2 kg    Telemetry    Paroxysmal atrial fibrillation with heart rates up to the 120 range.- Personally Reviewed  ECG    Atrial fibrillation with a ventricular response of  89 right bundle branch block.  This is a new finding.- Personally Reviewed  Physical Exam   GEN: No acute distress.   Neck: No JVD Cardiac: RRR, no murmurs, rubs, or gallops.  Respiratory: Clear to auscultation bilaterally. GI: Soft, nontender, non-distended  MS: No edema; No deformity. Neuro:  Nonfocal  Psych: Normal affect  Extremities: Trace edema with moderate erythema bilaterally  Labs    Chemistry Recent Labs  Lab 02/25/18 2214 02/26/18 0235 02/27/18 0301  NA 134* 135 136  K 3.0* 3.2* 2.6*  CL 89* 90* 91*  CO2 31 31 29   GLUCOSE 243* 186* 133*  BUN 89* 87* 82*  CREATININE 2.33* 2.25* 2.26*  CALCIUM 9.1 8.9 9.2  GFRNONAA 25* 26* 26*  GFRAA 29* 30* 30*  ANIONGAP 14 14 16*     Hematology Recent Labs  Lab 02/25/18 1114 02/26/18 0235 02/27/18 0301  WBC 4.7 6.3 4.1  RBC 3.82* 3.53* 3.52*  3.52*  HGB 11.3* 10.5* 10.5*  HCT 34.8* 32.0* 32.7*  MCV 91.1 90.7 92.9  MCH 29.6 29.7 29.8  MCHC 32.5 32.8 32.1  RDW 16.6* 16.6* 16.7*  PLT 188 164 185    Cardiac EnzymesNo results for input(s): TROPONINI in the last 168 hours.  Recent Labs  Lab 02/25/18 1136  TROPIPOC 0.01     BNP Recent Labs  Lab 02/24/18 1243 02/25/18 1340  BNP  --  1,809.7*  PROBNP 17,251*  --      DDimer No results for input(s): DDIMER in the last 168 hours.   Radiology    Dg Chest 2 View  Result Date: 02/25/2018 CLINICAL DATA:  Swelling both extremities.  Prior vascular surgery. EXAM: CHEST - 2 VIEW COMPARISON:  10/11/2017. FINDINGS: Prior CABG. Cardiomegaly with mild bilateral interstitial prominence consistent CHF. No focal infiltrate. No pneumothorax. IMPRESSION: Prior CABG. Cardiomegaly with diffuse bilateral interstitial prominence consistent with CHF. Electronically Signed   By: Marcello Moores  Register   On: 02/25/2018 11:41    Cardiac Studies   2D echocardiogram (02/09/2018)  Study Conclusions  - Left ventricle: The cavity size was normal. There was moderate   concentric  hypertrophy. Systolic function was mildly to   moderately reduced. The estimated ejection fraction was in the   range of 40% to 45%. Anterior hypokinesis. Grade 2 DD with high   LV filling pressure. - Aortic valve: Calcified without stenosis. Mean gradient (S): 6 mm   Hg. - Left atrium: Moderately dilated. - Right atrium: The atrium was mildly dilated. - Inferior vena cava: The vessel was normal in size. The   respirophasic diameter changes were in the normal range (>= 50%),   consistent with normal central venous pressure.  Impressions:  - Technically difficult study. LVEF 40-45%, more significant   anterior hypokinesis, grade 2 DD, high LV filling pressure,   moderate LAE, mild RAE.  Patient Profile     [name] is a 77 y.o. male with a hx of CAD s/p CABG in 3329, chronic diastolic heart failure, bilateral carotid artery disease s/p endarterectomy, PAD with claudication s/p aortobifemoral bypass, HTN, CKD, and HLD who is being seen today for the evaluation of lower extremity swelling and worsening renal function at the request of Dr. Francia Greaves.  His oral diuretics were changed to high-dose intravenous furosemide.  His eyes Nahser -1.1 L and his serum creatinine is 2.2 with a BNP of 1800.  Assessment & Plan    1: Acute on chronic systolic and diastolic heart failure- recent 2D echo showed EF of 40 to 45% with grade 2 diastolic dysfunction.  He is 4.1L  with an elevated serum creatinine.  We will continue to diurese him and follow his serum creatinine.  We can probably transition back to p.o. torsemide which she was on as an outpatient tomorrow.  2: Acute on chronic kidney disease-baseline creatinine 1.3-1.4 with an increase of serum creatinine to 2.25 as result of diuresis.  Continue to follow  3: Peripheral arterial disease.  Status post remote aortobifem with left common femoral endarterectomy and femoropopliteal bypass grafting.  He does appear to have cellulitis in his groin  and abdomen on IV antibiotics  4: Coronary artery disease-history of remote CABG.  Patient denies chest pain but does have newly diminished ejection fraction.  Agree that he may benefit from right left heart cath when she is adequately diuresed and his renal function stabilizes.  5: Hypokalemia- potassium 2.6.  Being repleted by the primary service.  6: Atrial fibrillation- this is a new finding.  He has heart rates up to the 120 range.  I would titrate his carvedilol and begin a novel oral anticoagulant.  For questions or updates, please contact [place] Please consult [email or web address] for contact info under        Signed, Quay Burow, MD  02/27/2018, 8:40 AM

## 2018-02-28 ENCOUNTER — Ambulatory Visit: Payer: Medicare HMO | Admitting: Cardiology

## 2018-02-28 ENCOUNTER — Encounter

## 2018-02-28 ENCOUNTER — Inpatient Hospital Stay (HOSPITAL_COMMUNITY): Payer: Medicare HMO

## 2018-02-28 DIAGNOSIS — I4891 Unspecified atrial fibrillation: Secondary | ICD-10-CM

## 2018-02-28 DIAGNOSIS — I739 Peripheral vascular disease, unspecified: Secondary | ICD-10-CM

## 2018-02-28 DIAGNOSIS — R600 Localized edema: Secondary | ICD-10-CM

## 2018-02-28 DIAGNOSIS — R0682 Tachypnea, not elsewhere classified: Secondary | ICD-10-CM

## 2018-02-28 DIAGNOSIS — N183 Chronic kidney disease, stage 3 (moderate): Secondary | ICD-10-CM

## 2018-02-28 DIAGNOSIS — I10 Essential (primary) hypertension: Secondary | ICD-10-CM

## 2018-02-28 DIAGNOSIS — E871 Hypo-osmolality and hyponatremia: Secondary | ICD-10-CM

## 2018-02-28 DIAGNOSIS — I25810 Atherosclerosis of coronary artery bypass graft(s) without angina pectoris: Secondary | ICD-10-CM

## 2018-02-28 DIAGNOSIS — I5040 Unspecified combined systolic (congestive) and diastolic (congestive) heart failure: Secondary | ICD-10-CM

## 2018-02-28 DIAGNOSIS — I5043 Acute on chronic combined systolic (congestive) and diastolic (congestive) heart failure: Secondary | ICD-10-CM

## 2018-02-28 DIAGNOSIS — E118 Type 2 diabetes mellitus with unspecified complications: Secondary | ICD-10-CM

## 2018-02-28 LAB — BASIC METABOLIC PANEL
Anion gap: 11 (ref 5–15)
BUN: 74 mg/dL — ABNORMAL HIGH (ref 8–23)
CO2: 33 mmol/L — ABNORMAL HIGH (ref 22–32)
Calcium: 8.9 mg/dL (ref 8.9–10.3)
Chloride: 89 mmol/L — ABNORMAL LOW (ref 98–111)
Creatinine, Ser: 2.01 mg/dL — ABNORMAL HIGH (ref 0.61–1.24)
GFR calc Af Amer: 35 mL/min — ABNORMAL LOW (ref >60)
GFR calc non Af Amer: 30 mL/min — ABNORMAL LOW (ref >60)
Glucose, Bld: 148 mg/dL — ABNORMAL HIGH (ref 70–99)
Potassium: 4 mmol/L (ref 3.5–5.1)
Sodium: 133 mmol/L — ABNORMAL LOW (ref 135–145)

## 2018-02-28 LAB — GLUCOSE, CAPILLARY
GLUCOSE-CAPILLARY: 170 mg/dL — AB (ref 70–99)
GLUCOSE-CAPILLARY: 152 mg/dL — AB (ref 70–99)
Glucose-Capillary: 134 mg/dL — ABNORMAL HIGH (ref 70–99)
Glucose-Capillary: 182 mg/dL — ABNORMAL HIGH (ref 70–99)

## 2018-02-28 NOTE — Progress Notes (Signed)
OT Cancellation Note  Patient Details Name: TYREESE THAIN MRN: [id number] DOB: [birth year]   Cancelled Treatment:    Reason Eval/Treat Not Completed: Patient at procedure or test/ unavailable(With PT. Will return as schedule allows. Thank you.)  [place], OTR/L Acute Rehab Pager: [phone] Office: [phone] 02/28/2018, 9:12 AM

## 2018-02-28 NOTE — Progress Notes (Signed)
[facility] PROGRESS NOTE  [name] KDT:[id number] DOB: 05/01/1941 DOA: 02/25/2018  PCP: Lavone Orn, MD  Brief History/Interval Summary: 77 y.o. male, with a hx of CAD s/p CABG in 9983, chronic diastolic heart failure, bilateral carotid artery disease s/p endarterectomy, PAD with claudication s/p aortobifemoral bypass, HTN, CKD, and HLD, who presented secondary to worsening lower extremity edema and progressive dyspnea.  He also recently underwent a femoral-popliteal bypass in June.  This was done by Dr. Oneida Alar.  He was recently started on Levaquin for cellulitis around the surgical site and lower abdomen.  Patient was admitted to the hospital.  Placed on IV diuretics.  Placed on IV antibiotics.  Reason for Visit: Acute diastolic CHF  Consultants: Cardiology.  Vascular surgery.  Procedures:   Lower extremity Doppler study Final Interpretation: Right: There is no evidence of deep vein thrombosis in the lower extremity. However, portions of this examination were limited- see technologist comments above. No cystic structure found in the popliteal fossa. Left: Findings consistent with acute superficial vein thrombosis involving the left great saphenous vein. Portions of this examination were limited- see technologist comments above. No cystic structure found in the popliteal fossa.  Antibiotics: Cefazolin  Subjective/Interval History: Patient states that he feels okay this morning.  Shortness of breath not much better compared to the last few days.  Denies any chest pain.  Has seen some floaters in his visual field.  Denies any weakness in any one side of his body.  No speech impairment.    ROS: Denies any nausea or vomiting  Objective:  Vital Signs  Vitals:   02/27/18 1943 02/27/18 2125 02/27/18 2328 02/28/18 0322  BP:  (!) 151/76 (!) 149/86 (!) 141/79  Pulse: 91 97 99 94  Resp: 18  14 (!) 25  Temp:   97.7 F (36.5 C) 98 F (36.7 C)  TempSrc:   Oral Oral    SpO2: 99%  99% 97%  Weight:    110.7 kg  Height:        Intake/Output Summary (Last 24 hours) at 02/28/2018 0810 Last data filed at 02/28/2018 0322 Gross per 24 hour  Intake 2000 ml  Output 2125 ml  Net -125 ml   Filed Weights   02/26/18 0549 02/27/18 0417 02/28/18 0322  Weight: 113.5 kg 112.2 kg 110.7 kg    General appearance: Awake alert.  In no distress Resp: Mildly tachypneic at rest.  Good air entry bilaterally however few crackles noted at the bases.  No wheezing or rhonchi.  No use of accessory muscles. Cardio: S1-S2 is irregularly irregular.  No S3-S4.  No rubs or bruit.  Telemetry shows atrial fibrillation. GI: Abdomen remains soft.  Nontender nondistended.  Bowel sounds are present normal.  No masses organomegaly Extremities: Improved erythema over the lower abdominal wall as well as the left lower extremity.  Swelling is also less compared to before. Neurologic: Awake alert.  Cranial nerves II to XII intact.  No pronator drift.  Motor strength equal bilateral upper and lower extremities.  Lab Results:  Data Reviewed: I have personally reviewed following labs and imaging studies  CBC: Recent Labs  Lab 02/25/18 1114 02/26/18 0235 02/27/18 0301  WBC 4.7 6.3 4.1  HGB 11.3* 10.5* 10.5*  HCT 34.8* 32.0* 32.7*  MCV 91.1 90.7 92.9  PLT 188 164 382    Basic Metabolic Panel: Recent Labs  Lab 02/25/18 2214 02/26/18 0235 02/27/18 0301 02/27/18 1414 02/28/18 0248  NA 134* 135 136 135 133*  K  3.0* 3.2* 2.6* 3.6 4.0  CL 89* 90* 91* 88* 89*  CO2 31 31 29  34* 33*  GLUCOSE 243* 186* 133* 163* 148*  BUN 89* 87* 82* 77* 74*  CREATININE 2.33* 2.25* 2.26* 2.11* 2.01*  CALCIUM 9.1 8.9 9.2 9.2 8.9  MG  --   --   --  2.3  --     GFR: Estimated Creatinine Clearance: 38.3 mL/min (A) (by C-G formula based on SCr of 2.01 mg/dL (H)).  BNP (last 3 results) Recent Labs    02/03/18 1217 02/24/18 1243  PROBNP 10,666* 17,251*    CBG: Recent Labs  Lab 02/26/18 2113  02/27/18 0908 02/27/18 1207 02/27/18 1630 02/27/18 2148  GLUCAP 155* 101* 198* 186* 189*     Recent Results (from the past 240 hour(s))  Culture, blood (routine x 2)     Status: None (Preliminary result)   Collection Time: 02/25/18  1:40 PM  Result Value Ref Range Status   Specimen Description BLOOD RIGHT WRIST  Final   Special Requests   Final    BOTTLES DRAWN AEROBIC AND ANAEROBIC Blood Culture adequate volume   Culture   Final    NO GROWTH 2 DAYS Performed at [facility], [place] [address], [place], [place] [address]    Report Status PENDING  Incomplete  Culture, blood (routine x 2)     Status: None (Preliminary result)   Collection Time: 02/25/18  1:40 PM  Result Value Ref Range Status   Specimen Description BLOOD SITE NOT SPECIFIED  Final   Special Requests   Final    BOTTLES DRAWN AEROBIC AND ANAEROBIC Blood Culture adequate volume   Culture   Final    NO GROWTH 3 DAYS Performed at [facility], [address] [place], [place] [address]    Report Status PENDING  Incomplete  MRSA PCR Screening     Status: None   Collection Time: 02/25/18  6:59 PM  Result Value Ref Range Status   MRSA by PCR NEGATIVE NEGATIVE Final    Comment:        The GeneXpert MRSA Assay (FDA approved for NASAL specimens only), is one component of a comprehensive MRSA colonization surveillance program. It is not intended to diagnose MRSA infection nor to guide or monitor treatment for MRSA infections. Performed at [facility], [place] [address], [place], [place] [address]       Radiology Studies: Dg Chest Port 1 View  Result Date: 02/28/2018 CLINICAL DATA:  CHF, shortness of breath. EXAM: PORTABLE CHEST 1 VIEW COMPARISON:  PA and lateral chest x-ray of February 25, 2018 FINDINGS: The lungs are adequately inflated. The interstitial markings are coarse. The cardiac silhouette is enlarged. The pulmonary vascularity is engorged. The patient has undergone previous median  sternotomy and CABG. There is calcification in the wall of the aortic arch. There is no significant pleural effusion and no pneumothorax. IMPRESSION: CHF with mild interstitial edema which when compared to the previous study which has worsened slightly since the previous study. This is accentuated by the AP portable technique. No alveolar pneumonia nor pleural effusion. Thoracic aortic atherosclerosis. Electronically Signed   By: David  Martinique M.D.   On: 02/28/2018 07:25     Medications:  Scheduled: . apixaban  5 mg Oral BID  . ascorbic acid  500 mg Oral BID  . aspirin EC  81 mg Oral Daily  . atorvastatin  20 mg Oral QPM  . carvedilol  25 mg Oral  BID  . feeding supplement (ENSURE ENLIVE)  237 mL Oral BID BM  . fenofibrate  54 mg Oral Daily  . gabapentin  300 mg Oral BID  . insulin aspart  0-9 Units Subcutaneous TID WC  . levothyroxine  100 mcg Oral QAC breakfast  . mouth rinse  15 mL Mouth Rinse BID  . multivitamin with minerals  1 tablet Oral Daily  . tamsulosin  0.4 mg Oral QODAY  . vitamin B-12  500 mcg Oral Daily   Continuous: .  ceFAZolin (ANCEF) IV 1 g (02/28/18 0553)  . furosemide Stopped (02/27/18 1943)   WER:[id number], polyvinyl alcohol, sodium chloride, temazepam  Assessment/Plan:    Acute on chronic systolic and diastolic CHF Most recent echocardiogram done on 02/09/2018 showed a EF of 40 to 45% with grade 2 diastolic dysfunction.  Patient with significant lower extremity edema.  Patient also with symptoms of orthopnea.  Patient started on intravenous diuretics.  Patient continues to diurese well.  His waist is decreasing.  However he does not feel much better from a symptom standpoint.  No ACE inhibitor or ARB due to chronic kidney disease.  Chest x-ray from this morning continues to show interstitial edema with comment that there is some worsening compared to before.  Await cardiology input.  Continue current dose of Lasix for now.  Strict ins and outs and daily  weights.  Renal function remains stable.  Acute on chronic kidney disease stage III Appears that his creatinine was mostly around 1.5 up until September of this year when it has been mostly around 2.  Creatinine was noted to be significantly higher than his baseline at admission.  Most likely due to cardiorenal syndrome.  Creatinine has been slowly improving as he is getting diuresed.  Continue to watch urine output.     New Onset Atrial Fibrillation Has been noted to be in atrial fibrillation.  Dose of carvedilol was increased per cardiology recommendations.  Started on Eliquis per pharmacy.  Echocardiogram was done recently in September.  No clear reason to repeat.  TSH was 4.5 in September.    Hypokalemia Potassium was aggressively repleted yesterday with improvement.  Potassium level is normal this morning.  Continue to monitor closely as he is getting diuresed.    Peripheral artery disease Followed by vascular surgery.  Recent femoropopliteal bypass.  Good pulses on the left lower extremity.  Cellulitis involving lower abdomen and left lower extremity Patient was noted to have cellulitis involving lower abdomen and left lower extremity at the site of his surgery.  He was given vancomycin in the ED.  He was continued on cefazolin.  Erythema in the left lower abdomen and left lower extremity has significantly improved.  Continue IV antibiotics for now.  MRSA PCR was negative.  Doppler study was done which showed acute superficial vein thrombosis involving the left great saphenous vein.  Symptomatic treatment.  Tremors/questionable myoclonic jerks Has been ongoing for a month according to the patient.  Etiology is unclear.  Patient followed by neurology, Dr. Posey Pronto, as an outpatient for neuropathy and memory impairment.  Underwent neuropsychological evaluation in August which suggested mild cognitive impairment thought to be vascular in etiology.  MRI of his brain done in April showed atrophy.  No  acute findings were noted.  Patient does not have any focal neurological deficits.  He was told that he will benefit from following up with his neurologist in the outpatient setting for further evaluation of his tremors.    Coronary artery  disease Denies any chest pain.  Continue with aspirin and statin.  Patient is on a beta-blocker.  Essential hypertension Monitor blood pressures closely.  Blood pressure is reasonably well controlled.  Diabetes mellitus type 2 Holding Actos.  This will need to be discontinued considering his CHF.  CBGs are reasonably well controlled.  Continue SSI.  History of prolonged QTC Continue to monitor on telemetry.  Normocytic anemia Likely due to chronic disease.  Hemoglobin remains stable.  Anemia panel reviewed.  Ferritin 76.  Folate 19.4.  TIBC 440.  Vitamin B12 1106. No evidence for overt bleeding.  Hypothyroidism Continue with levothyroxine.  TSH was 4.5 on September 12.  DVT Prophylaxis: Apixaban Code Status: Full code Family Communication: Discussed with the patient Disposition Plan: Seen by PT and OT.  Inpatient rehabilitation consulted.  Continue management as outlined above.    LOS: 3 days   [facility] Pager [phone] 02/28/2018, 8:10 AM  If 7PM-7AM, please contact night-coverage at [email or web address], password [facility]

## 2018-02-28 NOTE — Care Management (Signed)
#    2.  S/W JACKIE @ [facility] # [phone]  1. ELIQUIS   2.5 MG BID COVVER- YES CO-PAY- $ 47.00 TIER- 3 DRUG PRIOR APPROVAL- NO  2. ELIQUIS  5 MG BID COVER- YES CO-PAY- $ 47.00 TIER - 3 DRUG PRIOR APPROVAL- NO  PREFERRED PHARMACY : YES [facility]  AND [facility] 90 DAY SUPPLY FOR [facility] M/O $ 45.00

## 2018-02-28 NOTE — Progress Notes (Signed)
Inpatient Rehabilitation  Please see consult for full details.  Anticipate that once patient is medically stable will be able to return home with therapy.  However, patient has been to a SNF in the past as well if spouse is unable to assist.  CIR will follow along briefly in case of a functional decline.  Discussed with case Freight forwarder.  Call if questions.   Carmelia Roller., CCC/SLP Admission Coordinator  [facility]  Cell [phone]

## 2018-02-28 NOTE — Evaluation (Signed)
Occupational Therapy Evaluation Patient Details Name: [name] MRN: [id number] DOB: [birth year] Today's Date: 02/28/2018    History of Present Illness 77 y.o. male admitted with edema, dyspnea, CHF exacerbation. PMHx: fem pop June 2019, cellulitis, CAD s/p CABG in 0626, chronic diastolic heart failure, bilateral carotid artery disease s/p endarterectomy, PAD with claudication s/p aortobifemoral bypass, HTN, CKD, and HLD   Clinical Impression   PTA, pt was living with his wife and was performing BADLs (with increased time) and used RW for functional mobility. Pt currently requiring Min A for LB ADLs and functional mobility with RW. Pt presenting with decreased balance, strength, and endurance. Pt is highly motivated to return to independence and participate in therapy. Pt has very good family support. Pt would benefit from further acute OT to facilitate safe dc. Recommend dc to CIR for intensive OT to optimize safety, independence with ADLs, and return to PLOF as well as decrease caregiver burden.      Follow Up Recommendations  CIR;Supervision/Assistance - 24 hour    Equipment Recommendations  Other (comment)(Defer to next venue)    Recommendations for Other Services PT consult     Precautions / Restrictions Precautions Precautions: Fall Restrictions Weight Bearing Restrictions: No      Mobility Bed Mobility               General bed mobility comments: In recliner upon arrival  Transfers Overall transfer level: Needs assistance Equipment used: Rolling walker (2 wheeled) Transfers: Sit to/from Stand Sit to Stand: Min guard;From elevated surface         General transfer comment: Min Guard A for safety    Balance Overall balance assessment: Needs assistance   Sitting balance-Leahy Scale: Fair       Standing balance-Leahy Scale: Fair                             ADL either performed or assessed with clinical judgement   ADL Overall ADL's :  Needs assistance/impaired Eating/Feeding: Independent;Sitting   Grooming: Oral care;Min guard;Standing Grooming Details (indicate cue type and reason): Pt performing oral care at sink with Min Guard A for safety. Pt leaning against sink at elbows stating "That's how I have always done it. It frees up my hands."  Upper Body Bathing: Set up;Supervision/ safety;Sitting   Lower Body Bathing: Minimal assistance;Sit to/from stand   Upper Body Dressing : Set up;Supervision/safety;Sitting   Lower Body Dressing: Minimal assistance;Sit to/from stand Lower Body Dressing Details (indicate cue type and reason): Min A for power up and then dynamic balance Toilet Transfer: Minimal assistance;Ambulation;RW Toilet Transfer Details (indicate cue type and reason): Min A for safety Toileting- Clothing Manipulation and Hygiene: Minimal assistance;Sit to/from stand Toileting - Clothing Manipulation Details (indicate cue type and reason): Min A for managing clothing during urination at toilet while standing     Functional mobility during ADLs: Minimal assistance;Rolling walker General ADL Comments: Pt with decreased strength, balance, and endurance. Pt motivated to participate.     Vision         Perception     Praxis      Pertinent Vitals/Pain Pain Assessment: Faces Faces Pain Scale: Hurts a little bit Pain Location: bilateral feet Pain Descriptors / Indicators: Constant;Discomfort;Grimacing Pain Intervention(s): Monitored during session;Repositioned     Hand Dominance Right   Extremity/Trunk Assessment Upper Extremity Assessment Upper Extremity Assessment: Generalized weakness   Lower Extremity Assessment Lower Extremity Assessment: Generalized weakness  Communication Communication Communication: No difficulties   Cognition Arousal/Alertness: Awake/alert Behavior During Therapy: WFL for tasks assessed/performed Overall Cognitive Status: Within Functional Limits for tasks  assessed                                     General Comments  Wife present during session. SpO2 in high 90s on RA at rest. SpO2 dropping to 86% on RA during activity. Pt denies SOB. Returning pt to 2L O2 and SpO2 returning to 90s.     Exercises     Shoulder Instructions      Home Living Family/patient expects to be discharged to:: Private residence Living Arrangements: Spouse/significant other Available Help at Discharge: Family;Available 24 hours/day Type of Home: House Home Access: Stairs to enter [employer] of Steps: 3 Entrance Stairs-Rails: Left Home Layout: One level     Bathroom Shower/Tub: Occupational psychologist: Handicapped height     Home Equipment: Clinical cytogeneticist - 2 wheels;Grab bars - tub/shower          Prior Functioning/Environment Level of Independence: Needs assistance  Gait / Transfers Assistance Needed: RW for functional mobility ADL's / Homemaking Assistance Needed: Since dc from SNF, pt has been performing ADLs with increased time. Wife had been assisting with donning pants.    Comments: returning back home from SNF in august        OT Problem List: Decreased strength;Decreased range of motion;Decreased activity tolerance;Impaired balance (sitting and/or standing);Pain;Increased edema;Cardiopulmonary status limiting activity      OT Treatment/Interventions: Self-care/ADL training;Therapeutic exercise;Energy conservation;DME and/or AE instruction;Therapeutic activities;Patient/family education    OT Goals(Current goals can be found in the care plan section) Acute Rehab OT Goals Patient Stated Goal: "Go to my granddaughter's swim meet." OT Goal Formulation: With patient/family Time For Goal Achievement: 03/14/18 Potential to Achieve Goals: Good  OT Frequency: Min 2X/week   Barriers to D/C:            Co-evaluation              AM-PAC PT "6 Clicks" Daily Activity     Outcome Measure Help from  another person eating meals?: None Help from another person taking care of personal grooming?: A Little Help from another person toileting, which includes using toliet, bedpan, or urinal?: A Little Help from another person bathing (including washing, rinsing, drying)?: A Little Help from another person to put on and taking off regular upper body clothing?: A Little Help from another person to put on and taking off regular lower body clothing?: A Little 6 Click Score: 19   End of Session Equipment Utilized During Treatment: Rolling walker;Oxygen Nurse Communication: Mobility status  Activity Tolerance: Patient tolerated treatment well Patient left: in chair;with call bell/phone within reach;with chair alarm set;with family/visitor present  OT Visit Diagnosis: Unsteadiness on feet (R26.81);Other abnormalities of gait and mobility (R26.89);Muscle weakness (generalized) (M62.81);Pain Pain - Right/Left: Left Pain - part of body: Ankle and joints of foot                Time: 1447-1531 OT Time Calculation (min): 44 min Charges:  OT General Charges $OT Visit: 1 Visit OT Evaluation $OT Eval Moderate Complexity: 1 Mod OT Treatments $Self Care/Home Management : 23-37 mins  Anacleto Batterman MSOT, OTR/L Acute Rehab Pager: [phone] Office: [place] 02/28/2018, 4:41 PM

## 2018-02-28 NOTE — Progress Notes (Signed)
Physical Therapy Treatment Patient Details Name: [name] MRN: [id number] DOB: 20-Sep-1940 Today's Date: 02/28/2018    History of Present Illness 77 y.o. male admitted with edema, dyspnea, CHF exacerbation. PMHx: fem pop June 2019, cellulitis, CAD s/p CABG in 7939, chronic diastolic heart failure, bilateral carotid artery disease s/p endarterectomy, PAD with claudication s/p aortobifemoral bypass, HTN, CKD, and HLD    PT Comments    Pt agreeable to therapy today, happy to get out of bed. Pt able to progress ambulation to three trials of 80, 80, and 100 ft w/ need for seated rests breaks secondary to fatigue and SOB. Pt with increased steadiness, reports has orthotic brace for left foot for foot drop, but denied use today. During challenge with vertical and horizontal head turns, pt with significant decrease in speed with need to stop completely for processing. Pt will continue to benefit from skilled therapy to increase balance with challenge and activity tolerance.       Follow Up Recommendations  CIR;Supervision/Assistance - 24 hour     Equipment Recommendations  None recommended by PT    Recommendations for Other Services       Precautions / Restrictions Precautions Precautions: Fall    Mobility  Bed Mobility Overal bed mobility: Modified Independent Bed Mobility: Supine to Sit     Supine to sit: HOB elevated;Modified independent (Device/Increase time)     General bed mobility comments: increased time.   Transfers Overall transfer level: Needs assistance Equipment used: Rolling walker (2 wheeled) Transfers: Sit to/from Stand Sit to Stand: Supervision;From elevated surface         General transfer comment: two standing trials, elevated surface from bed, use of arm rests in chair.   Ambulation/Gait Ambulation/Gait assistance: Min guard Gait Distance (Feet): 100 Feet(80, 80, 100 ) Assistive device: Rolling walker (2 wheeled) Gait Pattern/deviations:  Step-through pattern;Trunk flexed Gait velocity: decreased Gait velocity interpretation: 1.31 - 2.62 ft/sec, indicative of limited community ambulator General Gait Details: pt overall steady with RW. hip hike and excess knee flexion on Lt to clear foot, pt reports recently dx of drop foot has brace that can wear during ambulation, but denied use today and reports does not wear in the home.  cues for posture in RW. pt reports fatigue of 6/10 with each seated rest break with complaint of SOB and weakness in legs.    Stairs             Wheelchair Mobility    Modified Rankin (Stroke Patients Only)       Balance Overall balance assessment: Needs assistance   Sitting balance-Leahy Scale: Fair       Standing balance-Leahy Scale: Fair               High level balance activites: Head turns;Direction changes;Sudden stops High Level Balance Comments: Pt with significantly decreased gait speed with challenge, particularly vertical head turns. Pt with increased time for processing for follow commands with walking, often stopping to process.             Cognition Arousal/Alertness: Awake/alert Behavior During Therapy: WFL for tasks assessed/performed Overall Cognitive Status: Within Functional Limits for tasks assessed                                        Exercises      General Comments        Pertinent Vitals/Pain Pain Assessment:  No/denies pain    Home Living                      Prior Function            PT Goals (current goals can now be found in the care plan section) Progress towards PT goals: Progressing toward goals    Frequency    Min 3X/week      PT Plan Current plan remains appropriate    Co-evaluation              AM-PAC PT "6 Clicks" Daily Activity  Outcome Measure  Difficulty turning over in bed (including adjusting bedclothes, sheets and blankets)?: A Little Difficulty moving from lying on back to  sitting on the side of the bed? : A Little Difficulty sitting down on and standing up from a chair with arms (e.g., wheelchair, bedside commode, etc,.)?: A Lot Help needed moving to and from a bed to chair (including a wheelchair)?: A Little Help needed walking in hospital room?: A Little Help needed climbing 3-5 steps with a railing? : A Lot 6 Click Score: 16    End of Session Equipment Utilized During Treatment: Gait belt Activity Tolerance: Patient tolerated treatment well Patient left: in chair;with call bell/phone within reach;with family/visitor present;with chair alarm set Nurse Communication: Mobility status PT Visit Diagnosis: Unsteadiness on feet (R26.81);Other abnormalities of gait and mobility (R26.89);Muscle weakness (generalized) (M62.81);Repeated falls (R29.6);Difficulty in walking, not elsewhere classified (R26.2)     Time: 7544-9201 PT Time Calculation (min) (ACUTE ONLY): 45 min  Charges:  $Gait Training: 8-22 mins $Therapeutic Activity: 23-37 mins                     Samuella Bruin, [place]  Acute Rehab [phone]    Samuella Bruin 02/28/2018, 10:06 AM

## 2018-02-28 NOTE — Progress Notes (Signed)
Progress Note  Patient Name: [name] Date of Encounter: 02/28/2018  Primary Cardiologist: Belva Crome III, MD   Subjective   Overall, feeling better but still with orthopnea. He tried lowering head of bed last night but unable to. Felt SOB. Also notes some occasional chest tightness, but currently CP free.   Inpatient Medications    Scheduled Meds: . apixaban  5 mg Oral BID  . ascorbic acid  500 mg Oral BID  . aspirin EC  81 mg Oral Daily  . atorvastatin  20 mg Oral QPM  . carvedilol  25 mg Oral BID  . feeding supplement (ENSURE ENLIVE)  237 mL Oral BID BM  . fenofibrate  54 mg Oral Daily  . gabapentin  300 mg Oral BID  . insulin aspart  0-9 Units Subcutaneous TID WC  . levothyroxine  100 mcg Oral QAC breakfast  . mouth rinse  15 mL Mouth Rinse BID  . multivitamin with minerals  1 tablet Oral Daily  . tamsulosin  0.4 mg Oral QODAY  . vitamin B-12  500 mcg Oral Daily   Continuous Infusions: .  ceFAZolin (ANCEF) IV 1 g (02/28/18 0553)  . furosemide 120 mg (02/28/18 0920)   PRN Meds: acetaminophen, polyvinyl alcohol, sodium chloride, temazepam   Vital Signs    Vitals:   02/27/18 2125 02/27/18 2328 02/28/18 0322 02/28/18 0826  BP: (!) 151/76 (!) 149/86 (!) 141/79 138/69  Pulse: 97 99 94 (!) 57  Resp:  14 (!) 25 (!) 21  Temp:  97.7 F (36.5 C) 98 F (36.7 C) 98.3 F (36.8 C)  TempSrc:  Oral Oral Oral  SpO2:  99% 97% 98%  Weight:   110.7 kg   Height:        Intake/Output Summary (Last 24 hours) at 02/28/2018 1049 Last data filed at 02/28/2018 0322 Gross per 24 hour  Intake 1560 ml  Output 2125 ml  Net -565 ml   Filed Weights   02/26/18 0549 02/27/18 0417 02/28/18 0322  Weight: 113.5 kg 112.2 kg 110.7 kg    Telemetry    Atrial fibrillation, 70s-80s, PVCs - Personally Reviewed  ECG    02/27/18 atrial fibrillation 89 bpm- Personally Reviewed  Physical Exam   GEN: moderately obese, elderly WM in No acute distress.   Neck: mild JVD Cardiac:  irregularly irregular rhythm, regular rate Respiratory: faint bibasilar crackles GI: Soft, nontender, non-distended  MS: 1+ bilateral pretibial edema Neuro:  Nonfocal  Psych: Normal affect   Labs    Chemistry Recent Labs  Lab 02/27/18 0301 02/27/18 1414 02/28/18 0248  NA 136 135 133*  K 2.6* 3.6 4.0  CL 91* 88* 89*  CO2 29 34* 33*  GLUCOSE 133* 163* 148*  BUN 82* 77* 74*  CREATININE 2.26* 2.11* 2.01*  CALCIUM 9.2 9.2 8.9  GFRNONAA 26* 28* 30*  GFRAA 30* 33* 35*  ANIONGAP 16* 13 11     Hematology Recent Labs  Lab 02/25/18 1114 02/26/18 0235 02/27/18 0301  WBC 4.7 6.3 4.1  RBC 3.82* 3.53* 3.52*  3.52*  HGB 11.3* 10.5* 10.5*  HCT 34.8* 32.0* 32.7*  MCV 91.1 90.7 92.9  MCH 29.6 29.7 29.8  MCHC 32.5 32.8 32.1  RDW 16.6* 16.6* 16.7*  PLT 188 164 185    Cardiac EnzymesNo results for input(s): TROPONINI in the last 168 hours.  Recent Labs  Lab 02/25/18 1136  TROPIPOC 0.01     BNP Recent Labs  Lab 02/24/18 1243 02/25/18 1340  BNP  --  1,809.7*  PROBNP 17,251*  --      DDimer No results for input(s): DDIMER in the last 168 hours.   Radiology    Dg Chest Port 1 View  Result Date: 02/28/2018 CLINICAL DATA:  CHF, shortness of breath. EXAM: PORTABLE CHEST 1 VIEW COMPARISON:  PA and lateral chest x-ray of February 25, 2018 FINDINGS: The lungs are adequately inflated. The interstitial markings are coarse. The cardiac silhouette is enlarged. The pulmonary vascularity is engorged. The patient has undergone previous median sternotomy and CABG. There is calcification in the wall of the aortic arch. There is no significant pleural effusion and no pneumothorax. IMPRESSION: CHF with mild interstitial edema which when compared to the previous study which has worsened slightly since the previous study. This is accentuated by the AP portable technique. No alveolar pneumonia nor pleural effusion. Thoracic aortic atherosclerosis. Electronically Signed   By: David  Martinique M.D.    On: 02/28/2018 07:25    Cardiac Studies   2D echocardiogram (02/09/2018)  Study Conclusions  - Left ventricle: The cavity size was normal. There was moderate concentric hypertrophy. Systolic function was mildly to moderately reduced. The estimated ejection fraction was in the range of 40% to 45%. Anterior hypokinesis. Grade 2 DD with high LV filling pressure. - Aortic valve: Calcified without stenosis. Mean gradient (S): 6 mm Hg. - Left atrium: Moderately dilated. - Right atrium: The atrium was mildly dilated. - Inferior vena cava: The vessel was normal in size. The respirophasic diameter changes were in the normal range (>= 50%), consistent with normal central venous pressure.  Impressions:  - Technically difficult study. LVEF 40-45%, more significant anterior hypokinesis, grade 2 DD, high LV filling pressure, moderate LAE, mild RAE.  Patient Profile     [name] a 77 y.o.malewith a hx of CAD s/p CABG in 3662, chronic diastolic heart failure, bilateral carotid artery disease s/p endarterectomy, PAD with claudication s/p aortobifemoral bypass, HTN, CKD, and HLDwho is being seen today for the evaluation of combined systolic and diastolic CHF and worsening renal function, as well as new LV dysfunction and new onset atrial fibrillation (02/26/18)at the request of Dr. Francia Greaves, Internal Medicine.   Assessment & Plan    1: Acute on chronic systolic and diastolic heart failure:  recent 2D echo showed EF of 40 to 45% with grade 2 diastolic dysfunction. Compared to prior echo in 2016, EF is reduced (previously 50-55%). Pt presented with volume overload and elevated BNP at 1,809.  Also w/ an elevated serum creatinine (2.3 on admit). Diuresing with IV Lasix.   -- good response to IV Lasix, -3.1 L out yesterday. Net I/Os negative 4L.  -- Weight down from 250 lb on admit to 244 lb today  --  Renal function improving with diuresis, SCr down from 2.3>>2.01    --  K WNL at 4.0. Na low at 133. Monitor    --  He remains volume overloaded w/ 1+ bilateral LE pretibial edema and bibasilar crackles. Also symptomatic with continued orthopnea.   -- Continue IV diuresis  -- Continue Strict I/Os, daily weights and low sodium diet  -- Continue to monitor renal function and electrolytes closely   2: Acute on chronic kidney disease-baseline creatinine 1.3-1.4 with a recent increase of serum creatinine to 2.3 on admit. Appears to be improving, 2.3>>2.26>>2.11>>2.01.  Continue to follow while diuresing.   3: Peripheral arterial disease.  Status post remote aortobifem with left common femoral endarterectomy and femoropopliteal bypass grafting.  He does appear to have cellulitis in his groin and abdomen on IV antibiotics  4: Coronary artery disease-history of remote CABG.  currently CP free but dose note some recent mild anterior chest tightness and does have newly diminished ejection fraction (40-45%, previously 50-55%) and WMA w/ more significant anterior hypokinesis.  Agree that he may benefit from right left heart cath when he is adequately diuresed and his renal function stabilizes. For now, continue medical management w/ ASA, statin and  blocker. May consider addition of Imdur.   5: Hypokalemia- resolved with supplementation. Potassium was as low as 2.6.  WNL today at 4.0. Continue to monitor while diuresing.   6: Atrial fibrillation- this is a new finding. First documented occurrence was night of 02/26/18.  His  blocker was titrated up this admit, Coreg 25 mg BID.  He has also been started on anticoagulation w/ Eliquis 5 mg BID. Consider checking TSH.   7. Cellulitis: left LE/ groin. Afebrile. On IV antibiotics. Management per IM.   8. HTN: controlled on current regimen.   9. DM: management per IM.    For questions or updates, please contact [place] Please consult [email or web address] for contact info under        Signed, Lyda Jester,  PA-C  02/28/2018, 10:49 AM

## 2018-02-28 NOTE — Consult Note (Addendum)
Physical Medicine and Rehabilitation Consult   Reason for Consult: Debility Referring Physician: Dr. Maryland Pink   HPI: [name] is a 77 y.o. male with history of T2DM, sensorimotor polyneuropathy with gait disorder, HTN, CAD s/p CABG with chronic diastolic heart failure, PAD with claudication s/p aortobifemoral bypass complicated by left groin infection and ongoing issues with cellulitis .  He had prolonged rehab stay 11/8240 complicated by cognitive issues, falls, BLE swelling and significant orthopnea due to fluid overload. He has been followed by heart failure clinic but continues to have worsening of symptoms and was admitted on 02/26/15 with DOE, hypoxia and weight gain.  He was started on IV drip diuresis for acute on chronic CHF with close monitoring of renal status.  IV cefazolin added due to ongoing issues with cellulitis and CTA recommended by Dr. Oneida Alar if rash does not resolve.    Cardiology following for input and feels that acute on chronic renal failure most likely due to cardiorenal syndrome. Bilateral lower dopplers showed acute superficial left great saphenous vein thrombosis.  He developed new onset A. fib and Eliquis added.  Patient also reported concerns about tremors/myoclonic jerks for the past month--etiology unclear and follow-up with Dr. Posey Pronto post discharge.  Physical therapy evaluations done revealing debility and CIR recommended for follow-up therapy. Patient requires assistance with bathing/dressing at baseline.   Review of Systems  Constitutional: Negative for chills and fever.  HENT: Negative for hearing loss and tinnitus.   Eyes: Negative for blurred vision and double vision.  Respiratory: Positive for shortness of breath.   Cardiovascular: Positive for leg swelling. Negative for chest pain and palpitations.  Gastrointestinal: Positive for constipation. Negative for abdominal pain, heartburn and nausea.  Genitourinary: Negative for dysuria and urgency.    Musculoskeletal: Positive for falls (20 falls since last December. ).  Skin: Negative for itching and rash.  Neurological: Positive for sensory change, focal weakness and headaches. Negative for dizziness.  Psychiatric/Behavioral: The patient does not have insomnia.   All other systems reviewed and are negative.  Past Medical History:  Diagnosis Date  . Arthritis   . Atherosclerotic heart disease   . Back pain   . Bruises easily    takes Pletal daily and ASA 325mg  daily  . Cataract immature    right  . Chest pain   . Colon polyps   . Complication of anesthesia    hard to wake up with bypass surgery  . Cyst    on perineum;takes Doxycycline daily  . Diabetes mellitus    takes Glipizide,Metformin,and Actos daily  . Diarrhea   . Dyspnea    when lying flat  . Edema    right leg knee down swollen since fall 3 wks ago  . Foot drop, left   . GERD (gastroesophageal reflux disease)    Rolaids as needed-occ reflux  . Hemorrhoids   . History of kidney stones    at age 59   . Hyperlipidemia    takes Tricor and Lipitor daily  . Hypertension    takes Altace,Amlodipine,and Nadolol daily  . Hypothyroidism   . Kidney stone    35+yrs ago  . Muscle pain   . Nasal polyps    hx of  . Peripheral edema    wears knee length hose-told by podiatrist to wear these  . Peripheral neuropathy    takes Gabapentin daily  . Pneumonia    as a child  . PVD (peripheral vascular disease) ([place])   . Renal  insufficiency   . Seasonal allergies    takes Mucinex and Zyrtec prn  . Urinary frequency    urgency/takes Vesicare daily    Past Surgical History:  Procedure Laterality Date  . Aortobifemoral bypass    . APPLICATION OF WOUND VAC Left 10/25/2017   Procedure: Wound vac placement to left groin.;  Surgeon: Elam Dutch, MD;  Location: [facility] OR;  Service: Vascular;  Laterality: Left;  . APPLICATION OF WOUND VAC Left 11/10/2017   Procedure: APPLICATION OF WOUND VAC;  Surgeon: Elam Dutch,  MD;  Location: [place];  Service: Vascular;  Laterality: Left;  . CARDIAC CATHETERIZATION  most recent 05/2011   total of 4  . CAROTID ANGIOGRAM N/A 06/05/2011   Procedure: CAROTID ANGIOGRAM;  Surgeon: Elam Dutch, MD;  Location: [facility] CATH LAB;  Service: Cardiovascular;  Laterality: N/A;  . CAROTID ENDARTERECTOMY  04/08/2011   left CEA  . cataract surgery Bilateral    left  . COLONOSCOPY    . COLONOSCOPY WITH PROPOFOL N/A 08/22/2013   Procedure: COLONOSCOPY WITH PROPOFOL;  Surgeon: Garlan Fair, MD;  Location: [facility] ENDOSCOPY;  Service: Endoscopy;  Laterality: N/A;  . CORONARY ARTERY BYPASS GRAFT  09/09/2011   Procedure: CORONARY ARTERY BYPASS GRAFTING (CABG);  Surgeon: Gaye Pollack, MD;  Location: [place];  Service: Open Heart Surgery;  Laterality: N/A;  Coronary artery bypass grafting  x three with Right saphenous vein harvested endoscopically and left internal mammary artery  . ENDARTERECTOMY  04/08/2011   Procedure: ENDARTERECTOMY CAROTID;  Surgeon: Elam Dutch, MD;  Location: [facility] OR;  Service: Vascular;  Laterality: Left;  with patch angioplasty  . ENDARTERECTOMY  09/09/2011   Procedure: ENDARTERECTOMY CAROTID;  Surgeon: Elam Dutch, MD;  Location: [facility] OR;  Service: Vascular;  Laterality: Right;  with patch angioplasty  . FEMORAL-POPLITEAL BYPASS GRAFT Left 10/25/2017   Procedure: Left Common Femoral Endarterectomy and perfundaplasty, Left BYPASS GRAFT FEMORAL-BELOW KNEE POPLITEAL ARTERY.;  Surgeon: Elam Dutch, MD;  Location: [place];  Service: Vascular;  Laterality: Left;  . GROIN DEBRIDEMENT Left 11/10/2017   Procedure: GROIN DEBRIDEMENT;  Surgeon: Elam Dutch, MD;  Location: [place];  Service: Vascular;  Laterality: Left;  . LEFT HEART CATHETERIZATION WITH CORONARY ANGIOGRAM N/A 07/09/2011   Procedure: LEFT HEART CATHETERIZATION WITH CORONARY ANGIOGRAM;  Surgeon: Sinclair Grooms, MD;  Location: [facility] CATH LAB;  Service: Cardiovascular;  Laterality: N/A;  . LOWER EXTREMITY  ANGIOGRAPHY N/A 10/11/2017   Procedure: LOWER EXTREMITY ANGIOGRAPHY;  Surgeon: Waynetta Sandy, MD;  Location: [place] CV LAB;  Service: Cardiovascular;  Laterality: N/A;  . Reimplantation of inferior mesenteric artery    . Repair of infrarenal abdominal aortic aneurysm    . SHOULDER ARTHROSCOPY W/ ROTATOR CUFF REPAIR     right and left-open procedures  . TONSILLECTOMY     at age 62  . TRIGGER FINGER RELEASE Right 06/28/2014   Procedure: RIGHT LONG TRIGGER RELEASE ;  Surgeon: Leanora Cover, MD;  Location: [place];  Service: Orthopedics;  Laterality: Right;  . wisdom teeth extracted     as a teenager  . WOUND DEBRIDEMENT Left 11/05/2017   Procedure: DEBRIDEMENT WOUND LEFT GROIN with wound vac placement;  Surgeon: Elam Dutch, MD;  Location: [facility] OR;  Service: Vascular;  Laterality: Left;    Family History  Problem Relation Age of Onset  . Heart disease Mother   . Heart disease Father   . Anesthesia problems Neg Hx   .  Hypotension Neg Hx   . Malignant hyperthermia Neg Hx   . Pseudochol deficiency Neg Hx     Social History:  Married. He  reports that he quit smoking about 10 years ago. He was using a standard walker with L-AFO. His smoking use included cigarettes. He quit after 40.00 years of use. He has never used smokeless tobacco. He reports that he drinks about wine and liquor couple of drinks daily till June. He reports that he does not use drugs.    Allergies  Allergen Reactions  . Amoxicillin-Pot Clavulanate Nausea Only    Has patient had a PCN reaction causing immediate rash, facial/tongue/throat swelling, SOB or lightheadedness with hypotension: No Has patient had a PCN reaction causing severe rash involving mucus membranes or skin necrosis: No Has patient had a PCN reaction that required hospitalization: No Has patient had a PCN reaction occurring within the last 10 years: No If all of the above answers are "NO", then may proceed with  Cephalosporin use.   Marland Kitchen Morphine And Related Other (See Comments)    hallucinates    Medications Prior to Admission  Medication Sig Dispense Refill  . Ascorbic Acid (VITAMIN C PO) Take 1 tablet by mouth 2 (two) times daily.    Marland Kitchen aspirin EC 81 MG tablet Take 1 tablet (81 mg total) by mouth daily. 90 tablet 3  . atorvastatin (LIPITOR) 20 MG tablet Take 20 mg by mouth every evening.     . carvedilol (COREG) 12.5 MG tablet Take 1 tablet (12.5 mg total) by mouth 2 (two) times daily. 180 tablet 3  . cephALEXin (KEFLEX) 500 MG capsule Take 1 capsule (500 mg total) by mouth every 12 (twelve) hours. 60 capsule 2  . Cyanocobalamin (VITAMIN B 12 PO) Take 500 mcg by mouth daily.     . fenofibrate (TRICOR) 145 MG tablet Take 145 mg by mouth every morning.    . gabapentin (NEURONTIN) 300 MG capsule Take 300 mg by mouth 2 (two) times daily.    Marland Kitchen levofloxacin (LEVAQUIN) 500 MG tablet Take 0.5 tablets (250 mg total) by mouth daily. 7 tablet 0  . levothyroxine (SYNTHROID, LEVOTHROID) 100 MCG tablet Take 100 mcg by mouth daily before breakfast.   3  . methylcellulose (ARTIFICIAL TEARS) 1 % ophthalmic solution Place 1 drop into both eyes 2 (two) times daily as needed (dry eyes).    . metolazone (ZAROXOLYN) 5 MG tablet Take 5 mg by mouth every other day. Take every other day until your appointment Wednesday 9/25    . Multiple Vitamin (MULTIVITAMIN WITH MINERALS) TABS tablet Take 1 tablet by mouth daily.    . nitroGLYCERIN (NITROSTAT) 0.4 MG SL tablet PLACE 1 TABLET UNDER TONGUE EVERY 5 MINUTES AS NEEDED FOR CHEST PAIN. (Patient taking differently: Place 0.4 mg under the tongue every 5 (five) minutes as needed for chest pain. ) 75 tablet 1  . oxymetazoline (VICKS SINEX) 0.05 % nasal spray Place 1 spray into both nostrils 2 (two) times daily as needed for congestion.    . pioglitazone (ACTOS) 30 MG tablet Take 1 tablet (30 mg total) by mouth daily with lunch.    . potassium chloride SA (K-DUR,KLOR-CON) 20 MEQ tablet  Take 20 mEq by mouth daily.     . pseudoephedrine-guaifenesin (MUCINEX D) 60-600 MG per tablet Take 1 tablet by mouth every 12 (twelve) hours as needed for congestion.     . sodium chloride (OCEAN) 0.65 % SOLN nasal spray Place 1 spray into both nostrils 2 (  two) times daily as needed for congestion.    . tamsulosin (FLOMAX) 0.4 MG CAPS capsule Take 0.4 mg by mouth every other day.    . temazepam (RESTORIL) 15 MG capsule Take 15 mg by mouth at bedtime as needed for sleep.     Marland Kitchen torsemide (DEMADEX) 20 MG tablet Take 3 tablets (60 mg total) by mouth 2 (two) times daily. 180 tablet 3  . UNABLE TO FIND Apply 1 application topically every 6 (six) hours as needed (pain). Med Name: *Ibuprofen 15% Baclofen 1% Lidocaine 10% Gabapentin 3% Topical Cream** To wrist    . zinc gluconate 50 MG tablet Take 50 mg by mouth daily.    . cetirizine (ZYRTEC ALLERGY) 10 MG tablet Take 1 tablet (10 mg total) by mouth daily as needed for allergies. (Patient not taking: Reported on 02/25/2018)    . senna-docusate (SENOKOT-S) 8.6-50 MG tablet Take 1 tablet by mouth at bedtime as needed for mild constipation. (Patient not taking: Reported on 02/25/2018)      Home: Home Living Family/patient expects to be discharged to:: Skilled nursing facility Living Arrangements: Spouse/significant other  Functional History: Prior Function Level of Independence: Needs assistance Gait / Transfers Assistance Needed: RW for functional mobility Comments: returning back home from SNF in august, [place] for household ambulation Functional Status:  Mobility: Bed Mobility Overal bed mobility: Needs Assistance Bed Mobility: Supine to Sit Supine to sit: Min assist General bed mobility comments: Min A to power up Transfers Overall transfer level: Needs assistance Equipment used: Rolling walker (2 wheeled) Transfers: Sit to/from Stand Sit to Stand: Min assist General transfer comment: Min A to Mod A from neurtal height bed to stand into RW.    Ambulation/Gait Ambulation/Gait assistance: Min guard Gait Distance (Feet): 30 Feet Assistive device: Rolling walker (2 wheeled) Gait Pattern/deviations: Step-to pattern General Gait Details: small step, min guard for safety and chair follow  Gait velocity: decreased    ADL:    Cognition: Cognition Overall Cognitive Status: Within Functional Limits for tasks assessed Orientation Level: Oriented X4 Cognition Arousal/Alertness: Awake/alert Behavior During Therapy: WFL for tasks assessed/performed Overall Cognitive Status: Within Functional Limits for tasks assessed   Blood pressure (!) 141/79, pulse 94, temperature 98 F (36.7 C), temperature source Oral, resp. rate (!) 25, height 5\' 11"  (1.803 m), weight 110.7 kg, SpO2 97 %. Physical Exam  Nursing note and vitals reviewed. Constitutional: He appears well-developed.  Obese  HENT:  Head: Normocephalic and atraumatic.  Eyes: EOM are normal. Right eye exhibits no discharge. Left eye exhibits no discharge.  Neck: Normal range of motion. Neck supple.  Cardiovascular:  Irregularly irregular  Respiratory: Effort normal and breath sounds normal.  GI: Soft. Bowel sounds are normal.  Musculoskeletal: He exhibits edema (2+ edema BLE). He exhibits no tenderness.  Neurological: He is alert.  Motor: B/l UE 5/5 proximal to distal RLE: HF, KE 2+/5, ADF 2+/5 (stronger than left) LLE: HF, KE 2+/5, ADF 1+/5 Sensation diminished to light touch b/l LE  Skin:  Stasis changes with dry flaky skin.     Results for orders placed or performed during the hospital encounter of 02/25/18 (from the past 24 hour(s))  Glucose, capillary     Status: Abnormal   Collection Time: 02/27/18  9:08 AM  Result Value Ref Range   Glucose-Capillary 101 (H) 70 - 99 mg/dL   Comment 1 Notify RN    Comment 2 Document in Chart   Glucose, capillary     Status: Abnormal   Collection  Time: 02/27/18 12:07 PM  Result Value Ref Range   Glucose-Capillary 198 (H) 70 -  99 mg/dL   Comment 1 Notify RN    Comment 2 Document in Chart   Basic metabolic panel     Status: Abnormal   Collection Time: 02/27/18  2:14 PM  Result Value Ref Range   Sodium 135 135 - 145 mmol/L   Potassium 3.6 3.5 - 5.1 mmol/L   Chloride 88 (L) 98 - 111 mmol/L   CO2 34 (H) 22 - 32 mmol/L   Glucose, Bld 163 (H) 70 - 99 mg/dL   BUN 77 (H) 8 - 23 mg/dL   Creatinine, Ser 2.11 (H) 0.61 - 1.24 mg/dL   Calcium 9.2 8.9 - 10.3 mg/dL   GFR calc non Af Amer 28 (L) >60 mL/min   GFR calc Af Amer 33 (L) >60 mL/min   Anion gap 13 5 - 15  Magnesium     Status: None   Collection Time: 02/27/18  2:14 PM  Result Value Ref Range   Magnesium 2.3 1.7 - 2.4 mg/dL  Glucose, capillary     Status: Abnormal   Collection Time: 02/27/18  4:30 PM  Result Value Ref Range   Glucose-Capillary 186 (H) 70 - 99 mg/dL   Comment 1 Notify RN    Comment 2 Document in Chart   Glucose, capillary     Status: Abnormal   Collection Time: 02/27/18  9:48 PM  Result Value Ref Range   Glucose-Capillary 189 (H) 70 - 99 mg/dL   Comment 1 Notify RN    Comment 2 Document in Chart   Basic metabolic panel     Status: Abnormal   Collection Time: 02/28/18  2:48 AM  Result Value Ref Range   Sodium 133 (L) 135 - 145 mmol/L   Potassium 4.0 3.5 - 5.1 mmol/L   Chloride 89 (L) 98 - 111 mmol/L   CO2 33 (H) 22 - 32 mmol/L   Glucose, Bld 148 (H) 70 - 99 mg/dL   BUN 74 (H) 8 - 23 mg/dL   Creatinine, Ser 2.01 (H) 0.61 - 1.24 mg/dL   Calcium 8.9 8.9 - 10.3 mg/dL   GFR calc non Af Amer 30 (L) >60 mL/min   GFR calc Af Amer 35 (L) >60 mL/min   Anion gap 11 5 - 15   Dg Chest Port 1 View  Result Date: 02/28/2018 CLINICAL DATA:  CHF, shortness of breath. EXAM: PORTABLE CHEST 1 VIEW COMPARISON:  PA and lateral chest x-ray of February 25, 2018 FINDINGS: The lungs are adequately inflated. The interstitial markings are coarse. The cardiac silhouette is enlarged. The pulmonary vascularity is engorged. The patient has undergone previous median  sternotomy and CABG. There is calcification in the wall of the aortic arch. There is no significant pleural effusion and no pneumothorax. IMPRESSION: CHF with mild interstitial edema which when compared to the previous study which has worsened slightly since the previous study. This is accentuated by the AP portable technique. No alveolar pneumonia nor pleural effusion. Thoracic aortic atherosclerosis. Electronically Signed   By: David  Martinique M.D.   On: 02/28/2018 07:25    Assessment/Plan: Diagnosis: Debility Labs independently reviewed.  Records reviewed and summated above.  1. Does the need for close, 24 hr/day medical supervision in concert with the patient's rehab needs make it unreasonable for this patient to be served in a less intensive setting? No 2. Co-Morbidities requiring supervision/potential complications: T2 DM with peripheral neuropathy (Monitor in accordance with  exercise and adjust meds as necessary), HTN (monitor and provide prns in accordance with increased physical exertion and pain), CAD s/p CABG (cont meds), severe acute on chronic heart failure (Monitor in accordance with increased physical activity and avoid UE resistance excercises), PAD with claudication s/p aortobifemoral bypass, A. fib (cont meds, monitor HR with increased mobility), tachypnea (monitor RR and O2 Sats with increased physical exertion), hyponatremia (cont to monitor, treat if necessary) 3. Due to safety, disease management and patient education, does the patient require 24 hr/day rehab nursing? Yes 4. Does the patient require coordinated care of a physician, rehab nurse, PT (1-2 hrs/day, 5 days/week) and OT (1-2 hrs/day, 5 days/week) to address physical and functional deficits in the context of the above medical diagnosis(es)? No Addressing deficits in the following areas: balance, endurance, locomotion, strength, transferring, bathing, dressing, toileting and psychosocial support 5. Can the patient actively  participate in an intensive therapy program of at least 3 hrs of therapy per day at least 5 days per week? Yes 6. The potential for patient to make measurable gains while on inpatient rehab is good and fair 7. Anticipated functional outcomes upon discharge from inpatient rehab are modified independent and supervision  with PT, modified independent and supervision with OT, n/a with SLP. 8. Estimated rehab length of stay to reach the above functional goals is: 3-5 days. 9. Anticipated D/C setting: Home 10. Anticipated post D/C treatments: Outpatient therapy and Home excercise program 11. Overall Rehab/Functional Prognosis: good  RECOMMENDATIONS: This patient's condition is appropriate for continued rehabilitative care in the following setting: Patient continues to make functional gain, predominantly limited by endurance.  Recommend Home with outpatient therapies when medically stable. Patient has agreed to participate in recommended program. Potentially Note that insurance prior authorization may be required for reimbursement for recommended care.  Comment: Rehab Admissions Coordinator to follow up.   I have personally performed a face to face diagnostic evaluation, including, but not limited to relevant history and physical exam findings, of this patient and developed relevant assessment and plan.  Additionally, I have reviewed and concur with the physician assistant's documentation above.   Delice Lesch, MD, ABPMR Bary Leriche, PA-C 02/28/2018

## 2018-03-01 LAB — BASIC METABOLIC PANEL
Anion gap: 13 (ref 5–15)
BUN: 69 mg/dL — AB (ref 8–23)
CALCIUM: 9.1 mg/dL (ref 8.9–10.3)
CO2: 34 mmol/L — ABNORMAL HIGH (ref 22–32)
CREATININE: 1.94 mg/dL — AB (ref 0.61–1.24)
Chloride: 89 mmol/L — ABNORMAL LOW (ref 98–111)
GFR calc Af Amer: 36 mL/min — ABNORMAL LOW (ref >60)
GFR, EST NON AFRICAN AMERICAN: 31 mL/min — AB (ref >60)
GLUCOSE: 127 mg/dL — AB (ref 70–99)
Potassium: 3 mmol/L — ABNORMAL LOW (ref 3.5–5.1)
Sodium: 136 mmol/L (ref 135–145)

## 2018-03-01 LAB — GLUCOSE, CAPILLARY
Glucose-Capillary: 116 mg/dL — ABNORMAL HIGH (ref 70–99)
Glucose-Capillary: 162 mg/dL — ABNORMAL HIGH (ref 70–99)
Glucose-Capillary: 128 mg/dL — ABNORMAL HIGH (ref 70–99)
Glucose-Capillary: 187 mg/dL — ABNORMAL HIGH (ref 70–99)

## 2018-03-01 MED ORDER — SODIUM CHLORIDE 0.9 % IV SOLN
INTRAVENOUS | Status: DC | PRN
  Administered 2018-03-01: 250 mL via INTRAVENOUS

## 2018-03-01 MED ORDER — POTASSIUM CHLORIDE CRYS ER 20 MEQ PO TBCR
40.0000 meq | EXTENDED_RELEASE_TABLET | Freq: Once | ORAL | Status: AC
  Administered 2018-03-01: 40 meq via ORAL

## 2018-03-01 MED ORDER — POTASSIUM CHLORIDE CRYS ER 20 MEQ PO TBCR
40.0000 meq | EXTENDED_RELEASE_TABLET | Freq: Once | ORAL | Status: AC
  Administered 2018-03-01: 40 meq via ORAL
  Filled 2018-03-01: qty 2

## 2018-03-01 MED ORDER — PRO-STAT SUGAR FREE PO LIQD
30.0000 mL | Freq: Two times a day (BID) | ORAL | Status: DC
  Administered 2018-03-01 – 2018-03-03 (×5): 30 mL via ORAL
  Filled 2018-03-01 (×5): qty 30

## 2018-03-01 MED ORDER — POTASSIUM CHLORIDE CRYS ER 10 MEQ PO TBCR
EXTENDED_RELEASE_TABLET | ORAL | Status: AC
  Filled 2018-03-01: qty 4

## 2018-03-01 NOTE — Progress Notes (Signed)
[facility] PROGRESS NOTE  [name] MEQ:[id number] DOB: 12/26/1940 DOA: 02/25/2018  PCP: Lavone Orn, MD  Brief History/Interval Summary: 77 y.o. male, with a hx of CAD s/p CABG in 2979, chronic diastolic heart failure, bilateral carotid artery disease s/p endarterectomy, PAD with claudication s/p aortobifemoral bypass, HTN, CKD, and HLD, who presented secondary to worsening lower extremity edema and progressive dyspnea.  He also recently underwent a femoral-popliteal bypass in June.  This was done by Dr. Oneida Alar.  He was recently started on Levaquin for cellulitis around the surgical site and lower abdomen.  Patient was admitted to the hospital.  Placed on IV diuretics.  Placed on IV antibiotics.  Reason for Visit: Acute diastolic CHF  Consultants: Cardiology.  Vascular surgery.  Procedures:   Lower extremity Doppler study Final Interpretation: Right: There is no evidence of deep vein thrombosis in the lower extremity. However, portions of this examination were limited- see technologist comments above. No cystic structure found in the popliteal fossa. Left: Findings consistent with acute superficial vein thrombosis involving the left great saphenous vein. Portions of this examination were limited- see technologist comments above. No cystic structure found in the popliteal fossa.  Antibiotics: Cefazolin  Subjective/Interval History: Patient states that his shortness of breath has improved slightly.  He is noted to be lying flat on the bed without any discomfort.  Denies any chest pain.    ROS: Denies any nausea or vomiting  Objective:  Vital Signs  Vitals:   02/28/18 2331 03/01/18 0324 03/01/18 0423 03/01/18 0745  BP: (!) 156/81 (!) 141/68  125/68  Pulse: 86 98  89  Resp: 18 16  (!) 21  Temp: 97.8 F (36.6 C) 97.7 F (36.5 C)  (!) 97.5 F (36.4 C)  TempSrc: Oral Oral  Oral  SpO2: 99% 99%  96%  Weight:   110 kg   Height:        Intake/Output Summary  (Last 24 hours) at 03/01/2018 1022 Last data filed at 03/01/2018 0900 Gross per 24 hour  Intake 3185.44 ml  Output 1750 ml  Net 1435.44 ml   Filed Weights   02/27/18 0417 02/28/18 0322 03/01/18 0423  Weight: 112.2 kg 110.7 kg 110 kg    General appearance: He is awake alert.  In no distress Resp: Noted to have normal effort at rest today.  Few crackles at the bases bilaterally.  No wheezing or rhonchi.   Cardio: Telemetry shows atrial fibrillation.  S1-S2 is irregularly irregular. GI: Abdomen remains soft.  Nontender nondistended. Extremities: Erythema over the lower abdominal wall in the left upper leg has almost resolved.  Slight erythema noted in the left lower leg.  Edema is also improved.   Neurologic: Awake alert.  Cranial nerves II to XII intact.  No pronator.  Motor strength equal bilateral upper and lower extremities.  Lab Results:  Data Reviewed: I have personally reviewed following labs and imaging studies  CBC: Recent Labs  Lab 02/25/18 1114 02/26/18 0235 02/27/18 0301  WBC 4.7 6.3 4.1  HGB 11.3* 10.5* 10.5*  HCT 34.8* 32.0* 32.7*  MCV 91.1 90.7 92.9  PLT 188 164 892    Basic Metabolic Panel: Recent Labs  Lab 02/26/18 0235 02/27/18 0301 02/27/18 1414 02/28/18 0248 03/01/18 0712  NA 135 136 135 133* 136  K 3.2* 2.6* 3.6 4.0 3.0*  CL 90* 91* 88* 89* 89*  CO2 31 29 34* 33* 34*  GLUCOSE 186* 133* 163* 148* 127*  BUN 87* 82* 77* 74* 69*  CREATININE 2.25* 2.26* 2.11* 2.01* 1.94*  CALCIUM 8.9 9.2 9.2 8.9 9.1  MG  --   --  2.3  --   --     GFR: Estimated Creatinine Clearance: 39.6 mL/min (A) (by C-G formula based on SCr of 1.94 mg/dL (H)).  BNP (last 3 results) Recent Labs    02/03/18 1217 02/24/18 1243  PROBNP 10,666* 17,251*    CBG: Recent Labs  Lab 02/28/18 0831 02/28/18 1155 02/28/18 1722 02/28/18 2041 03/01/18 0743  GLUCAP 134* 170* 152* 182* 116*     Recent Results (from the past 240 hour(s))  Culture, blood (routine x 2)      Status: None (Preliminary result)   Collection Time: 02/25/18  1:40 PM  Result Value Ref Range Status   Specimen Description BLOOD RIGHT WRIST  Final   Special Requests   Final    BOTTLES DRAWN AEROBIC AND ANAEROBIC Blood Culture adequate volume   Culture   Final    NO GROWTH 2 DAYS Performed at [facility], [place] [address] [place], [place] [address]    Report Status PENDING  Incomplete  Culture, blood (routine x 2)     Status: None (Preliminary result)   Collection Time: 02/25/18  1:40 PM  Result Value Ref Range Status   Specimen Description BLOOD SITE NOT SPECIFIED  Final   Special Requests   Final    BOTTLES DRAWN AEROBIC AND ANAEROBIC Blood Culture adequate volume   Culture   Final    NO GROWTH 3 DAYS Performed at [facility], [address] [place], [place] [address]    Report Status PENDING  Incomplete  MRSA PCR Screening     Status: None   Collection Time: 02/25/18  6:59 PM  Result Value Ref Range Status   MRSA by PCR NEGATIVE NEGATIVE Final    Comment:        The GeneXpert MRSA Assay (FDA approved for NASAL specimens only), is one component of a comprehensive MRSA colonization surveillance program. It is not intended to diagnose MRSA infection nor to guide or monitor treatment for MRSA infections. Performed at [facility], [place] [address], [place], [place] [address]       Radiology Studies: Dg Chest Port 1 View  Result Date: 02/28/2018 CLINICAL DATA:  CHF, shortness of breath. EXAM: PORTABLE CHEST 1 VIEW COMPARISON:  PA and lateral chest x-ray of February 25, 2018 FINDINGS: The lungs are adequately inflated. The interstitial markings are coarse. The cardiac silhouette is enlarged. The pulmonary vascularity is engorged. The patient has undergone previous median sternotomy and CABG. There is calcification in the wall of the aortic arch. There is no significant pleural effusion and no pneumothorax. IMPRESSION: CHF with mild interstitial edema which  when compared to the previous study which has worsened slightly since the previous study. This is accentuated by the AP portable technique. No alveolar pneumonia nor pleural effusion. Thoracic aortic atherosclerosis. Electronically Signed   By: David  Martinique M.D.   On: 02/28/2018 07:25     Medications:  Scheduled: . potassium chloride      . apixaban  5 mg Oral BID  . ascorbic acid  500 mg Oral BID  . aspirin EC  81 mg Oral Daily  . atorvastatin  20 mg Oral QPM  . carvedilol  25 mg Oral BID  . feeding supplement (ENSURE ENLIVE)  237 mL Oral BID BM  . fenofibrate  54 mg Oral Daily  . gabapentin  300  mg Oral BID  . insulin aspart  0-9 Units Subcutaneous TID WC  . levothyroxine  100 mcg Oral QAC breakfast  . mouth rinse  15 mL Mouth Rinse BID  . multivitamin with minerals  1 tablet Oral Daily  . tamsulosin  0.4 mg Oral QODAY  . vitamin B-12  500 mcg Oral Daily   Continuous: . sodium chloride Stopped (03/01/18 0559)  .  ceFAZolin (ANCEF) IV Stopped (03/01/18 0550)  . furosemide 120 mg (03/01/18 0806)   QHU:TMLYYT chloride, acetaminophen, polyvinyl alcohol, sodium chloride, temazepam  Assessment/Plan:    Acute on chronic systolic and diastolic CHF Most recent echocardiogram done on 02/09/2018 showed a EF of 40 to 45% with grade 2 diastolic dysfunction.  Patient with significant lower extremity edema.  Patient also with symptoms of orthopnea.  Patient started on intravenous diuretics.  Sometimes have slightly improved.  Patient continues to diurese well.  His weight is decreasing.  Does not appear like he put out a lot of urine yesterday.  However nurse tells me that he had incontinent episodes.  Cardiology continues to follow.  Chest x-ray from 10/7 continue to show interstitial edema.  Continue strict ins and outs and daily weights.  Renal function actually has improved.  No ACE inhibitor or ARB due to chronic kidney disease.  Sitter repeating chest x-ray in the next 1 to 2 days.  It  appears that cardiology is considering left and right heart catheterization if renal function improves.  Acute on chronic kidney disease stage III Appears that his creatinine was mostly around 1.5 up until September of this year when it has been mostly around 2.  Creatinine was noted to be significantly higher than his baseline at admission.  Most likely due to cardiorenal syndrome.  Creatinine has been slowly improving.  He is developing metabolic alkalosis.  Continue to watch for now.  Continue to monitor urine output.     New Onset Atrial Fibrillation Has been noted to be in atrial fibrillation.  Dose of carvedilol was increased per cardiology recommendations.  Started on Eliquis per pharmacy.  Echocardiogram was done recently in September.  No clear reason to repeat.  TSH was 4.5 in September.    Hypokalemia Potassium noted to be low again this morning.  Will be supplemented orally.    Peripheral artery disease Followed by vascular surgery.  Recent femoropopliteal bypass.  Good pulses on the left lower extremity.  Cellulitis involving lower abdomen and left lower extremity Patient was noted to have cellulitis involving lower abdomen and left lower extremity at the site of his surgery.  He was given vancomycin in the ED. then changed over to cefazolin which was continued.  Erythema in the lower abdomen as well as left lower extremity has improved.  Continue antibiotics intravenously for 1 more day.  Consider transition to oral antibiotics tomorrow.  MRSA PCR was negative.  Doppler study was done which showed acute superficial vein thrombosis involving the left great saphenous vein.  Symptomatic treatment.  Tremors/questionable myoclonic jerks Has been ongoing for a month according to the patient.  Etiology is unclear.  Patient followed by neurology, Dr. Posey Pronto, as an outpatient for neuropathy and memory impairment.  Underwent neuropsychological evaluation in August which suggested mild cognitive  impairment thought to be vascular in etiology.  MRI of his brain done in April showed atrophy.  No acute findings were noted.  Patient also mention floaters in his vision yesterday which appears to have resolved.  Patient does not have any  focal neurological deficits.  He was told that he will benefit from following up with his neurologist in the outpatient setting for further evaluation of his tremors.    Coronary artery disease Denies any chest pain.  Continue with aspirin and statin.  Patient is on a beta-blocker.  Essential hypertension Blood pressure remains reasonably well controlled.  Continue to monitor.  Diabetes mellitus type 2 Holding Actos.  This will need to be discontinued considering his CHF.  CBGs are reasonably well controlled.  Continue SSI.  History of prolonged QTC Continue to monitor on telemetry.  Normocytic anemia Likely due to chronic disease.  Hemoglobin remains stable.  Anemia panel reviewed.  Ferritin 76.  Folate 19.4.  TIBC 440.  Vitamin B12 1106. No evidence for overt bleeding.  Hypothyroidism Continue with levothyroxine.  TSH was 4.5 on September 12.  DVT Prophylaxis: Apixaban Code Status: Full code Family Communication: Discussed with the patient Disposition Plan: PT and OT.  Seen by inpatient rehab and not thought to be a good candidate for inpatient rehabilitation.  Cardiology continues to follow.      LOS: 4 days   [place] Pager [phone] 03/01/2018, 10:22 AM  If 7PM-7AM, please contact night-coverage at [email or web address], password [facility] [place]

## 2018-03-01 NOTE — NC FL2 (Signed)
[place] LEVEL OF CARE SCREENING TOOL     IDENTIFICATION  Patient Name: [name] Birthdate: 12-Mar-1941 Sex: male Admission Date (Current Location): 02/25/2018  [facility] and [place] Number:  Herbalist and Address:  [facility], [place] [address] [place], [place] [address]      Provider Number: [id number]  Attending Physician Name and Address:  Bonnielee Haff, MD  Relative Name and Phone Number:  cornel werber, [phone]    Current Level of Care: Hospital Recommended Level of Care: [place] Prior Approval Number:    Date Approved/Denied:   PASRR Number: [id number]  Discharge Plan: SNF    Current Diagnoses: Patient Active Problem List   Diagnosis Date Noted  . Coronary artery disease involving coronary bypass graft of native heart without angina pectoris   . Benign essential HTN   . PAD (peripheral artery disease) ([place])   . New onset atrial fibrillation ([place])   . Tachypnea   . Hyponatremia   . Malnutrition of moderate degree 02/27/2018  . CHF (congestive heart failure) ([place]) 02/25/2018  . Volume overload 11/10/2017  . Surgical wound infection 11/04/2017  . Polyneuropathy associated with underlying disease ([place]) 08/16/2017  . Essential hypertension 02/06/2014  . Acute on chronic combined systolic and diastolic CHF (congestive heart failure) ([place]) 02/06/2014  . Chronic kidney disease, stage III (moderate) ([place]) 02/06/2014  . Aftercare following surgery of the circulatory system, [place] 01/25/2014  . Carotid stenosis 06/08/2013  . Stricture of artery ([place]) 04/14/2012  . Peripheral vascular disease, unspecified ([place]) 10/08/2011  . DVT of upper extremity (deep vein thrombosis), left 09/29/2011  . S/P CABG (coronary artery bypass graft) 09/29/2011  . H/O carotid endarterectomy 09/29/2011  . Occlusion and stenosis of carotid artery without mention of cerebral infarction 06/11/2011  . Diabetes  mellitus type 2 with complications ([place]) 48/25/0037    Orientation RESPIRATION BLADDER Height & Weight     Self, Time, Situation, Place  O2(2L) External catheter, Incontinent Weight: 242 lb 8.1 oz (110 kg) Height:  5\' 11"  (180.3 cm)  BEHAVIORAL SYMPTOMS/MOOD NEUROLOGICAL BOWEL NUTRITION STATUS      Continent Diet(heart healthy/carb modified)  AMBULATORY STATUS COMMUNICATION OF NEEDS Skin   Limited Assist Verbally                         Personal Care Assistance Level of Assistance  Bathing, Feeding, Dressing Bathing Assistance: Limited assistance Feeding assistance: Independent Dressing Assistance: Limited assistance     Functional Limitations Info  Sight, Hearing, Speech Sight Info: Adequate Hearing Info: Adequate Speech Info: Adequate    SPECIAL CARE FACTORS FREQUENCY  PT (By licensed PT), OT (By licensed OT)     PT Frequency: 5x wk OT Frequency: 5x wk            Contractures Contractures Info: Not present    Additional Factors Info  Code Status, Allergies Code Status Info: full code Allergies Info: AMOXICILLIN-POT CLAVULANATE, MORPHINE AND RELATED            Current Medications (03/01/2018):  This is the current hospital active medication list Current Facility-Administered Medications  Medication Dose Route Frequency Provider Last Rate Last Dose  . 0.9 %  sodium chloride infusion   Intravenous PRN Bonnielee Haff, MD   Stopped at 03/01/18 0559  . acetaminophen (TYLENOL) tablet 650 mg  650 mg Oral Q6H PRN Lovey Newcomer T, NP   650 mg at 02/27/18 0653  . apixaban (  ELIQUIS) tablet 5 mg  5 mg Oral BID Lorretta Harp, MD   5 mg at 03/01/18 0949  . ascorbic acid (VITAMIN C) 500 MG/5ML syrup 500 mg  500 mg Oral BID Elgergawy, Silver Huguenin, MD   500 mg at 03/01/18 0950  . aspirin EC tablet 81 mg  81 mg Oral Daily Elgergawy, Silver Huguenin, MD   81 mg at 03/01/18 0950  . atorvastatin (LIPITOR) tablet 20 mg  20 mg Oral QPM Elgergawy, Silver Huguenin, MD   20 mg at 02/28/18  1751  . carvedilol (COREG) tablet 25 mg  25 mg Oral BID Bonnielee Haff, MD   25 mg at 03/01/18 0949  . ceFAZolin (ANCEF) IVPB 1 g/50 mL premix  1 g Intravenous Q8H Bonnielee Haff, MD   Stopped at 03/01/18 0550  . feeding supplement (PRO-STAT SUGAR FREE 64) liquid 30 mL  30 mL Oral BID Bonnielee Haff, MD      . fenofibrate tablet 54 mg  54 mg Oral Daily Elgergawy, Silver Huguenin, MD   54 mg at 03/01/18 0950  . furosemide (LASIX) 120 mg in dextrose 5 % 50 mL IVPB  120 mg Intravenous BID Elgergawy, Silver Huguenin, MD 62 mL/hr at 03/01/18 0806 120 mg at 03/01/18 0806  . gabapentin (NEURONTIN) capsule 300 mg  300 mg Oral BID Elgergawy, Silver Huguenin, MD   300 mg at 03/01/18 0950  . insulin aspart (novoLOG) injection 0-9 Units  0-9 Units Subcutaneous TID WC Elgergawy, Silver Huguenin, MD   2 Units at 02/28/18 1755  . levothyroxine (SYNTHROID, LEVOTHROID) tablet 100 mcg  100 mcg Oral QAC breakfast Elgergawy, Silver Huguenin, MD   100 mcg at 03/01/18 0805  . MEDLINE mouth rinse  15 mL Mouth Rinse BID Elgergawy, Silver Huguenin, MD   15 mL at 02/28/18 0921  . multivitamin with minerals tablet 1 tablet  1 tablet Oral Daily Elgergawy, Silver Huguenin, MD   1 tablet at 03/01/18 0950  . polyvinyl alcohol (LIQUIFILM TEARS) 1.4 % ophthalmic solution 1 drop  1 drop Both Eyes BID PRN Elgergawy, Silver Huguenin, MD      . potassium chloride SA (K-DUR,KLOR-CON) CR tablet 40 mEq  40 mEq Oral Once Bonnielee Haff, MD      . sodium chloride (OCEAN) 0.65 % nasal spray 1 spray  1 spray Each Nare BID PRN Elgergawy, Silver Huguenin, MD   1 spray at 02/26/18 0950  . tamsulosin (FLOMAX) capsule 0.4 mg  0.4 mg Oral QODAY Elgergawy, Silver Huguenin, MD   0.4 mg at 03/01/18 0950  . temazepam (RESTORIL) capsule 15 mg  15 mg Oral QHS PRN Elgergawy, Silver Huguenin, MD   15 mg at 03/01/18 0202  . vitamin B-12 (CYANOCOBALAMIN) tablet 500 mcg  500 mcg Oral Daily Elgergawy, Silver Huguenin, MD   500 mcg at 03/01/18 9326     Discharge Medications: Please see discharge summary for a list of discharge  medications.  Relevant Imaging Results:  Relevant Lab Results:   Additional Information SSN: [id number]  Anthony Neighbors, LCSW
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
## 2018-03-01 NOTE — Care Management Note (Signed)
Case Management Note  Patient Details  Name: [name] MRN: [id number] Date of Birth: 1940-08-26  Subjective/Objective:     Pt admitted with heart failure             Action/Plan:  PTA Independent from home.  CIR recommended however deemed inappropriate - CSW following as back up plan.   Expected Discharge Date:                  Expected Discharge Plan:  Skilled Nursing Facility  In-House Referral:  Clinical Social Work  Discharge planning Services  CM Consult  Post Acute Care Choice:    Choice offered to:     DME Arranged:    DME Agency:     HH Arranged:    [place] Agency:     Status of Service:  In process, will continue to follow  If discussed at Long Length of Stay Meetings, dates discussed:    Additional Comments: 03/01/2018 CM informed CSW that pt is nearing stability for discharge to SNF.  CM will continue to follow for discharge needs Maryclare Labrador, RN 03/01/2018, 11:25 AM

## 2018-03-01 NOTE — Progress Notes (Signed)
Nutrition Follow-up  DOCUMENTATION CODES:   Non-severe (moderate) malnutrition in context of chronic illness  INTERVENTION:   -D/c Ensure Enlive po BID, each supplement provides 350 kcal and 20 grams of protein, due to poor acceptance -Continue Atkins Protein shakes (pt wife has been bringing in) -30 ml Prostat BID, each supplement provides 100 kcals and 15 grams protein  NUTRITION DIAGNOSIS:   Moderate Malnutrition related to chronic illness(chronic wound, CHF, CKD) as evidenced by mild fat depletion, mild muscle depletion.  Ongoing  GOAL:   Patient will meet greater than or equal to 90% of their needs  Progressing  MONITOR:   PO intake, Labs, Supplement acceptance, Weight trends, Skin  REASON FOR ASSESSMENT:   Malnutrition Screening Tool    ASSESSMENT:   77 yo male admitted with acute on chronic systolic CHF, AKI on CKD III, cellulitis of LLQ with left groin wound. PMH includes CAD s/p CABG, CHF, b/l carotid artery disease s/p endarterectomy, PAD,HTN, CKD, HLD  Reviewed I/O''s: +2 L x 24 hours and -2 L since admission  Attempted to speak with pt x 3, however, [t in with MD at times of multiple visits. Pt wife was also on phone at time of last visit. Per RNCM notes, pt progressing towards discharge.   Per VVS notes, lt groin and abdominal wall erythema has resolved.   Pt remains with good appetite. Noted meal completion 100%. Pt has been refusing Ensure supplements that were ordered, but pt wife is bringing in preferred Atkins shakes.   Noted 3% wt loss since admission, likely related to diuresis.   Labs reviewed: CBGS: [phone] (inpatient orders for glycemic control are inpatient orders for glycemic control are 0-9 units insulin aspart TID with meals).   Diet Order:   Diet Order            Diet heart healthy/carb modified Room service appropriate? Yes; Fluid consistency: Thin; Fluid restriction: 1500 mL Fluid  Diet effective now              EDUCATION  NEEDS:   Education needs have been addressed  Skin:  Skin Assessment: Skin Integrity Issues: Skin Integrity Issues:: Other (Comment) Other: lt foot venous stasis ulcer, lt groin incision  Last BM:  02/27/18  Height:   Ht Readings from Last 1 Encounters:  02/25/18 5\' 11"  (1.803 m)    Weight:   Wt Readings from Last 1 Encounters:  03/01/18 110 kg    Ideal Body Weight:  78.2 kg  BMI:  Body mass index is 33.82 kg/m.  Estimated Nutritional Needs:   Kcal:  2000-2200 kcals   Protein:  115-140 g  Fluid:  1.5 L    Fergie Sherbert A. Jimmye Norman, RD, LDN, CDE Pager: [phone] After hours Pager: [phone]

## 2018-03-01 NOTE — Care Management Important Message (Signed)
Important Message  Patient Details  Name: [name] MRN: [id number] Date of Birth: 09-05-1940   Medicare Important Message Given:  Yes    Barb Merino Pierce Barocio 03/01/2018, 3:16 PM

## 2018-03-01 NOTE — Progress Notes (Signed)
[facility]  Subjective  - feels better   Objective 125/68 89 (!) 97.5 F (36.4 C) (Oral) (!) 21 96%  Intake/Output Summary (Last 24 hours) at 03/01/2018 1120 Last data filed at 03/01/2018 1000 Gross per 24 hour  Intake 3185.44 ml  Output 1750 ml  Net 1435.44 ml   Legs much less swollen Left groin 2 mm open wound with healthy granulation Abdomen erythema completely resolved  Assessment/Planning: Improved left groin and abdominal wall erythema resolved Will follow peripherally   Ruta Hinds 03/01/2018 11:20 AM --  Laboratory Lab Results: Recent Labs    02/27/18 0301  WBC 4.1  HGB 10.5*  HCT 32.7*  PLT 185   BMET Recent Labs    02/28/18 0248 03/01/18 0712  NA 133* 136  K 4.0 3.0*  CL 89* 89*  CO2 33* 34*  GLUCOSE 148* 127*  BUN 74* 69*  CREATININE 2.01* 1.94*  CALCIUM 8.9 9.1    COAG Lab Results  Component Value Date   INR 1.27 11/10/2017   INR 1.20 11/04/2017   INR 1.13 10/21/2017   No results found for: PTT

## 2018-03-01 NOTE — Progress Notes (Addendum)
Progress Note  Patient Name: [name] Date of Encounter: 03/01/2018  Primary Cardiologist: Sinclair Grooms, MD   Subjective   Did not have a good night last night. Could not get any rest. Remains on supplemental O2.   Inpatient Medications    Scheduled Meds: . apixaban  5 mg Oral BID  . ascorbic acid  500 mg Oral BID  . aspirin EC  81 mg Oral Daily  . atorvastatin  20 mg Oral QPM  . carvedilol  25 mg Oral BID  . feeding supplement (ENSURE ENLIVE)  237 mL Oral BID BM  . fenofibrate  54 mg Oral Daily  . gabapentin  300 mg Oral BID  . insulin aspart  0-9 Units Subcutaneous TID WC  . levothyroxine  100 mcg Oral QAC breakfast  . mouth rinse  15 mL Mouth Rinse BID  . multivitamin with minerals  1 tablet Oral Daily  . tamsulosin  0.4 mg Oral QODAY  . vitamin B-12  500 mcg Oral Daily   Continuous Infusions: . sodium chloride Stopped (03/01/18 0559)  .  ceFAZolin (ANCEF) IV Stopped (03/01/18 0550)  . furosemide 120 mg (03/01/18 0806)   PRN Meds: sodium chloride, acetaminophen, polyvinyl alcohol, sodium chloride, temazepam   Vital Signs    Vitals:   02/28/18 2331 03/01/18 0324 03/01/18 0423 03/01/18 0745  BP: (!) 156/81 (!) 141/68  125/68  Pulse: 86 98  89  Resp: 18 16  (!) 21  Temp: 97.8 F (36.6 C) 97.7 F (36.5 C)  (!) 97.5 F (36.4 C)  TempSrc: Oral Oral  Oral  SpO2: 99% 99%  96%  Weight:   110 kg   Height:        Intake/Output Summary (Last 24 hours) at 03/01/2018 0950 Last data filed at 03/01/2018 0900 Gross per 24 hour  Intake 3185.44 ml  Output 1750 ml  Net 1435.44 ml   Filed Weights   02/27/18 0417 02/28/18 0322 03/01/18 0423  Weight: 112.2 kg 110.7 kg 110 kg    Telemetry    afib 80s - Personally Reviewed  ECG    afib 89 bpm  - Personally Reviewed  Physical Exam   GEN: moderately obese, elderly WM, in no acute distress Neck: + JVD above the clavicle  Cardiac: irregularly irregular rhythm, regular rate Respiratory: faint basilar  crackles, L>R GI: Soft, nontender, non-distended  MS: trace pretibial edema bilaterally, venous stasis dermatitis Neuro:  Nonfocal  Psych: Normal affect   Labs    Chemistry Recent Labs  Lab 02/27/18 1414 02/28/18 0248 03/01/18 0712  NA 135 133* 136  K 3.6 4.0 3.0*  CL 88* 89* 89*  CO2 34* 33* 34*  GLUCOSE 163* 148* 127*  BUN 77* 74* 69*  CREATININE 2.11* 2.01* 1.94*  CALCIUM 9.2 8.9 9.1  GFRNONAA 28* 30* 31*  GFRAA 33* 35* 36*  ANIONGAP 13 11 13      Hematology Recent Labs  Lab 02/25/18 1114 02/26/18 0235 02/27/18 0301  WBC 4.7 6.3 4.1  RBC 3.82* 3.53* 3.52*  3.52*  HGB 11.3* 10.5* 10.5*  HCT 34.8* 32.0* 32.7*  MCV 91.1 90.7 92.9  MCH 29.6 29.7 29.8  MCHC 32.5 32.8 32.1  RDW 16.6* 16.6* 16.7*  PLT 188 164 185    Cardiac EnzymesNo results for input(s): TROPONINI in the last 168 hours.  Recent Labs  Lab 02/25/18 1136  TROPIPOC 0.01     BNP Recent Labs  Lab 02/24/18 1243 02/25/18 1340  BNP  --  1,809.7*  PROBNP 17,251*  --      DDimer No results for input(s): DDIMER in the last 168 hours.   Radiology    Dg Chest Port 1 View  Result Date: 02/28/2018 CLINICAL DATA:  CHF, shortness of breath. EXAM: PORTABLE CHEST 1 VIEW COMPARISON:  PA and lateral chest x-ray of February 25, 2018 FINDINGS: The lungs are adequately inflated. The interstitial markings are coarse. The cardiac silhouette is enlarged. The pulmonary vascularity is engorged. The patient has undergone previous median sternotomy and CABG. There is calcification in the wall of the aortic arch. There is no significant pleural effusion and no pneumothorax. IMPRESSION: CHF with mild interstitial edema which when compared to the previous study which has worsened slightly since the previous study. This is accentuated by the AP portable technique. No alveolar pneumonia nor pleural effusion. Thoracic aortic atherosclerosis. Electronically Signed   By: David  Martinique M.D.   On: 02/28/2018 07:25    Cardiac  Studies   2D echocardiogram (02/09/2018)  Study Conclusions  - Left ventricle: The cavity size was normal. There was moderate concentric hypertrophy. Systolic function was mildly to moderately reduced. The estimated ejection fraction was in the range of 40% to 45%. Anterior hypokinesis. Grade 2 DD with high LV filling pressure. - Aortic valve: Calcified without stenosis. Mean gradient (S): 6 mm Hg. - Left atrium: Moderately dilated. - Right atrium: The atrium was mildly dilated. - Inferior vena cava: The vessel was normal in size. The respirophasic diameter changes were in the normal range (>= 50%), consistent with normal central venous pressure.  Impressions:  - Technically difficult study. LVEF 40-45%, more significant anterior hypokinesis, grade 2 DD, high LV filling pressure, moderate LAE, mild RAE.  Patient Profile     [name] a 77 y.o.malewith a hx of CAD s/p CABG in 4270, chronic diastolic heart failure, bilateral carotid artery disease s/p endarterectomy, PAD with claudication s/p aortobifemoral bypass, HTN, CKD, and HLDwho is being seen today for the evaluation of combined systolic and diastolic CHF and worsening renal function, as well as new LV dysfunction and new onset atrial fibrillation (02/26/18)at the request of Dr. Francia Greaves, Internal Medicine.   Assessment & Plan    1: Acute on chronic systolic and diastolic heart failure: recent 2D echo showed EF of 40 to 45% with grade 2 diastolic dysfunction. Compared to prior echo in 2016, EF is reduced (previously 50-55%). Pt presented with volume overload and elevated BNP at 1,809. Also w/ an elevated serum creatinine (2.3 on admit). Diuresing well with IV Lasix.              -- I/Os are not accurate due to incontinence (difficulty w/ condom catheter yesterday, was wearing Depends most of the day. Condom cath back in place, however pt notes good urinary output.     --  Weight down from 250  lb on admit to 242 lb today  -- Lung exam improved since yesterday, although still some faint crackles in LLL, LEE also improved, with trace bilateral pretibial edema noted  -- Can try transition back to PO diuretics in the next 24 hrs             --  Renal function improving with diuresis, SCr down from 2.3>>1.94             --  K low at 3.0. Supplementation ordered. Na back WNL.               -- Continue Strict I/Os,  daily weights and low sodium diet             -- Continue to monitor renal function and electrolytes closely   2: Acute on chronic kidney disease-baseline creatinine 1.3-1.4 with a recent increase of serum creatinine to 2.3 on admit. Appears to be improving, 2.3>>2.26>>2.11>>2.01>>1.94 today.Continue to follow while diuresing.   3: Peripheral arterial disease. Status post remote aortobifem with left common femoral endarterectomy and femoropopliteal bypass grafting. He does appear to have cellulitis in his groin and abdomen on IV antibiotics. Pt was seen by Dr. Oneida Alar during cardiac examination and he is pleased with progress.   4: Coronary artery disease-history of remote CABG. Currently CP free but dose note some recent mild anterior chest tightness and does have newly diminished ejection fraction (40-45%, previously 50-55%) and WMA w/ more significant anterior hypokinesis. Agree that he may benefit from right left heart cath when he is adequately diuresed and his renal function stabilizes. For now, continue medical management w/ ASA, statin and ? blocker. May consider addition of Imdur. May defer further ischemic w/u to his primary cardiologist, Dr. Tamala Julian.   5: Hypokalemia - 3.0 today, in the setting of IV diuretic therapy for CHF. Continue to monitor while diuresing. Will order supplementation, Kdur 40 mg. F/u BMP in the AM.   6: Atrial fibrillation-this is a new finding. First documented occurrence was night of 02/26/18. His ? blocker was titrated up this admit, Coreg  25 mg BID. Rate is controlled.  He has also been started on anticoagulation w/ Eliquis 5 mg BID. Consider checking TSH.   7. Cellulitis: left LE/ groin. Afebrile. On IV antibiotics. Management per IM.   8. HTN: controlled on current regimen.  BP this morning 125/68. Continue to monitor while diuresing.   9. DM: management per IM.   10. Hyponatremia: Na WNL today at 136. Continue to monitor.      For questions or updates, please contact [place] Please consult [email or web address] for contact info under        Signed, Lyda Jester, PA-C  03/01/2018, 9:50 AM

## 2018-03-02 LAB — GLUCOSE, CAPILLARY
GLUCOSE-CAPILLARY: 225 mg/dL — AB (ref 70–99)
Glucose-Capillary: 143 mg/dL — ABNORMAL HIGH (ref 70–99)
Glucose-Capillary: 132 mg/dL — ABNORMAL HIGH (ref 70–99)
Glucose-Capillary: 152 mg/dL — ABNORMAL HIGH (ref 70–99)

## 2018-03-02 LAB — CULTURE, BLOOD (ROUTINE X 2)
Culture: NO GROWTH
SPECIAL REQUESTS: ADEQUATE

## 2018-03-02 LAB — BASIC METABOLIC PANEL
Anion gap: 12 (ref 5–15)
BUN: 75 mg/dL — AB (ref 8–23)
CHLORIDE: 91 mmol/L — AB (ref 98–111)
CO2: 32 mmol/L (ref 22–32)
Calcium: 9 mg/dL (ref 8.9–10.3)
Creatinine, Ser: 1.91 mg/dL — ABNORMAL HIGH (ref 0.61–1.24)
GFR calc Af Amer: 37 mL/min — ABNORMAL LOW (ref >60)
GFR calc non Af Amer: 32 mL/min — ABNORMAL LOW (ref >60)
GLUCOSE: 149 mg/dL — AB (ref 70–99)
POTASSIUM: 3.9 mmol/L (ref 3.5–5.1)
Sodium: 135 mmol/L (ref 135–145)

## 2018-03-02 MED ORDER — ACTIVE PARTNERSHIP FOR HEALTH OF YOUR HEART BOOK
Freq: Once | Status: DC
  Administered 2018-03-02: 18:00:00
  Filled 2018-03-02: qty 1

## 2018-03-02 MED ORDER — TORSEMIDE 20 MG PO TABS: 120.0000 mg | ORAL_TABLET | Freq: Two times a day (BID) | ORAL | Status: DC

## 2018-03-02 MED ORDER — LIVING BETTER WITH HEART FAILURE BOOK
Freq: Once | Status: AC
  Administered 2018-03-02: 18:00:00

## 2018-03-02 MED ORDER — TORSEMIDE 20 MG PO TABS
60.0000 mg | ORAL_TABLET | Freq: Two times a day (BID) | ORAL | Status: DC
  Administered 2018-03-02 – 2018-03-03 (×2): 60 mg via ORAL
  Filled 2018-03-02 (×2): qty 3

## 2018-03-02 MED ORDER — EXERCISE FOR HEART AND HEALTH BOOK
Freq: Once | Status: AC
  Administered 2018-03-02: 18:00:00
  Filled 2018-03-02: qty 1

## 2018-03-02 MED ORDER — THE SENSUOUS HEART BOOK
Freq: Once | Status: AC
  Administered 2018-03-02: 18:00:00
  Filled 2018-03-02: qty 1

## 2018-03-02 NOTE — Plan of Care (Signed)

## 2018-03-02 NOTE — Progress Notes (Signed)
PROGRESS NOTE    SAMANTHA OLIVERA  YIF:[id number] DOB: 11-Sep-1940 DOA: 02/25/2018 PCP: Lavone Orn, MD    Brief Narrative:  77 year old male who presented with lower extremity edema.  He does have significant past medical history for coronary artery disease status post bypass grafting (8676), diastolic heart failure, carotid artery disease status post endarterectomy, peripheral vascular disease status post aorto bifemoral bypass, hypertension, chronic kidney disease and dyslipidemia.  Patient reported worsening dyspnea, lower extremity edema, weight gain and orthopnea.  He was recently diagnosed with cellulitis.  Symptoms persisted despite diuretic dose adjustment as an outpatient.  On his initial physical examination blood pressure 138/64, heart rate 65, temperature 97.7, respiratory rate 19, oxygen saturation 100%.  He had positive JVD, his lungs had decreased breath sounds at bases with positive rales, heart S1-S2 present and rhythmic, no gallops rubs or murmurs, the abdomen was soft nontender, no guarding, mild cellulitis left abdominal wall and positive lower extremity edema.   Patient was admitted to the hospital with working diagnosis of acute decompensation of chronic diastolic heart failure.  Assessment & Plan:   Active Problems:   Diabetes mellitus type 2 with complications (HCC)   Essential hypertension   Chronic kidney disease, stage III (moderate) (HCC)   CHF (congestive heart failure) (HCC)   Malnutrition of moderate degree   Coronary artery disease involving coronary bypass graft of native heart without angina pectoris   Benign essential HTN   PAD (peripheral artery disease) ([place])   New onset atrial fibrillation (HCC)   Tachypnea   Hyponatremia   1. Acute on chronic diastolic heart failure decompensation. Will continue diuresis with high dose of furosemide 120 mg IV bid, urine output over last 24 hours was 2.170 ml. Continue to have rales, orthopnea and lower extremity  edema. Echocardiography from 01/2018, personally reviewed old records, with EF 40 to 45%, continue heart failure management with carvedilol.   2. AKI on CKD stage 3. Today serum cr at 1,91, from 1,93, K at 3,9 and serum bicarbonate at 32. Will continue diuresis with furosemide, follow renal panel in am, avoid hypotension and nephrotoxic medications. Urine output 2.170 ml over last 24 hours.   3. Cellulitis. Clinically rash has resolved, will hold on further antibiotic therapy. Continue to follow up clinically.   4. Atherocardiovascular disease. Continue aspirin and atorvastatin/ fenofibrate.  5. Paroxysmal atrial fibrillation. Continue anticoagulation with apixaban, continue rate control with carvedilol.   6. T2DM. Continue insulin sliding scale for glucose cover and montoring, patient tolerating po well, fasting glucose 149.   7. Hypothyroid. Continue levothyroxine.   DVT prophylaxis: apixaban   Code Status:  full Family Communication: I spoke with patient's wife at the bedside and all questions were addressed.  Disposition Plan/ discharge barriers: pending clinical improvement, will need SNF.    Consultants:    cardiology   Procedures:     Antimicrobials:       Subjective: Dyspnea has improved but still not back to baseline, continue to have lower extremity edema and orthopnea. No chest pain, no nausea or vomiting. Very weak and deconditioned.   Objective: Vitals:   03/01/18 2346 03/02/18 0409 03/02/18 0629 03/02/18 0814  BP: (!) 133/59 (!) 146/81  125/69  Pulse: 84 95    Resp: (!) 24 16    Temp: 98 F (36.7 C) (!) 97.5 F (36.4 C)  98.3 F (36.8 C)  TempSrc: Oral Oral  Oral  SpO2: 97% 100%    Weight:   109.8 kg   Height:  Intake/Output Summary (Last 24 hours) at 03/02/2018 0823 Last data filed at 03/02/2018 0600 Gross per 24 hour  Intake 1750.12 ml  Output 2170 ml  Net -419.88 ml   Filed Weights   02/28/18 0322 03/01/18 0423 03/02/18 0629  Weight:  110.7 kg 110 kg 109.8 kg    Examination:   General: deconditioned and ill looking appearing  Neurology: Awake and alert, non focal  E ENT: mild pallor, no icterus, oral mucosa moist Cardiovascular: No JVD. S1-S2 present, rhythmic, no gallops, rubs, or murmurs. ++ lower extremity edema. Pulmonary: decreased breath sounds, positive rales bilaterally at bases, , no wheezing, or rhonchi.  Gastrointestinal. Abdomen protuberant with no organomegaly, non tender, no rebound or guarding Skin. No rashes/ Left abdominal rash has resolved Musculoskeletal: no joint deformities     Data Reviewed: I have personally reviewed following labs and imaging studies  CBC: Recent Labs  Lab 02/25/18 1114 02/26/18 0235 02/27/18 0301  WBC 4.7 6.3 4.1  HGB 11.3* 10.5* 10.5*  HCT 34.8* 32.0* 32.7*  MCV 91.1 90.7 92.9  PLT 188 164 503   Basic Metabolic Panel: Recent Labs  Lab 02/27/18 0301 02/27/18 1414 02/28/18 0248 03/01/18 0712 03/02/18 0252  NA 136 135 133* 136 135  K 2.6* 3.6 4.0 3.0* 3.9  CL 91* 88* 89* 89* 91*  CO2 29 34* 33* 34* 32  GLUCOSE 133* 163* 148* 127* 149*  BUN 82* 77* 74* 69* 75*  CREATININE 2.26* 2.11* 2.01* 1.94* 1.91*  CALCIUM 9.2 9.2 8.9 9.1 9.0  MG  --  2.3  --   --   --    GFR: Estimated Creatinine Clearance: 40.2 mL/min (A) (by C-G formula based on SCr of 1.91 mg/dL (H)). Liver Function Tests: No results for input(s): AST, ALT, ALKPHOS, BILITOT, PROT, ALBUMIN in the last 168 hours. No results for input(s): LIPASE, AMYLASE in the last 168 hours. No results for input(s): AMMONIA in the last 168 hours. Coagulation Profile: No results for input(s): INR, PROTIME in the last 168 hours. Cardiac Enzymes: No results for input(s): CKTOTAL, CKMB, CKMBINDEX, TROPONINI in the last 168 hours. BNP (last 3 results) Recent Labs    02/03/18 1217 02/24/18 1243  PROBNP 10,666* 17,251*   HbA1C: No results for input(s): HGBA1C in the last 72 hours. CBG: Recent Labs  Lab  02/28/18 2041 03/01/18 0743 03/01/18 1236 03/01/18 1611 03/01/18 2044  GLUCAP 182* 116* 162* 128* 187*   Lipid Profile: No results for input(s): CHOL, HDL, LDLCALC, TRIG, CHOLHDL, LDLDIRECT in the last 72 hours. Thyroid Function Tests: No results for input(s): TSH, T4TOTAL, FREET4, T3FREE, THYROIDAB in the last 72 hours. Anemia Panel: No results for input(s): VITAMINB12, FOLATE, FERRITIN, TIBC, IRON, RETICCTPCT in the last 72 hours.    Radiology Studies: I have reviewed all of the imaging during this hospital visit personally     Scheduled Meds: . apixaban  5 mg Oral BID  . ascorbic acid  500 mg Oral BID  . aspirin EC  81 mg Oral Daily  . atorvastatin  20 mg Oral QPM  . carvedilol  25 mg Oral BID  . feeding supplement (PRO-STAT SUGAR FREE 64)  30 mL Oral BID  . fenofibrate  54 mg Oral Daily  . gabapentin  300 mg Oral BID  . insulin aspart  0-9 Units Subcutaneous TID WC  . levothyroxine  100 mcg Oral QAC breakfast  . mouth rinse  15 mL Mouth Rinse BID  . multivitamin with minerals  1 tablet Oral Daily  .  tamsulosin  0.4 mg Oral QODAY  . vitamin B-12  500 mcg Oral Daily   Continuous Infusions: . sodium chloride Stopped (03/01/18 0559)  .  ceFAZolin (ANCEF) IV 1 g (03/02/18 0507)  . furosemide 120 mg (03/02/18 0814)     LOS: 5 days        Mauricio Gerome Apley, MD [facility] Pager [phone]

## 2018-03-02 NOTE — Progress Notes (Addendum)
Physical Therapy Treatment Patient Details Name: [name] MRN: [id number] DOB: Oct 06, 1940 Today's Date: 03/02/2018    History of Present Illness 77 y.o. male admitted with edema, dyspnea, CHF exacerbation. PMHx: fem pop June 2019, cellulitis, CAD s/p CABG in 9518, chronic diastolic heart failure, bilateral carotid artery disease s/p endarterectomy, PAD with claudication s/p aortobifemoral bypass, HTN, CKD, and HLD    PT Comments    Patient presents with mobility progressing and likely could go home with HHPT if has 24 hour assist.  Wife relates has been working it out (with difficulty as she works), but currently frustrated pt will not go to inpatient rehab.  Case manger in room speaking to family end of session.  PT to follow acutely.   Follow Up Recommendations  Home health PT;Supervision/Assistance - 24 hour     Equipment Recommendations  None recommended by PT    Recommendations for Other Services       Precautions / Restrictions Precautions Precautions: Fall    Mobility  Bed Mobility               General bed mobility comments: In recliner upon arrival  Transfers Overall transfer level: Needs assistance Equipment used: Rolling walker (2 wheeled) Transfers: Sit to/from Stand Sit to Stand: Supervision         General transfer comment: assist for safety from recliner heavy UE use for standing  Ambulation/Gait Ambulation/Gait assistance: Min guard Gait Distance (Feet): 120 Feet(& 160) Assistive device: Rolling walker (2 wheeled)(L AFO) Gait Pattern/deviations: Step-through pattern;Decreased stride length;Trunk flexed;Wide base of support     General Gait Details: donned compression socks and shoes for pt with wife assist with L AFO; cues throughout for posture, forward gaze and monitoring SpO2 throughout   Stairs             Wheelchair Mobility    Modified Rankin (Stroke Patients Only)       Balance Overall balance assessment: Needs  assistance   Sitting balance-Leahy Scale: Good     Standing balance support: During functional activity Standing balance-Leahy Scale: Poor Standing balance comment: stood to rinse mouth with mouthwash and water leaning over sink with minguard and at least single UE support                            Cognition Arousal/Alertness: Awake/alert Behavior During Therapy: WFL for tasks assessed/performed Overall Cognitive Status: Within Functional Limits for tasks assessed                                        Exercises      General Comments General comments (skin integrity, edema, etc.): SpO2 90-94% on RA throughout session       Pertinent Vitals/Pain Pain Assessment: Faces Faces Pain Scale: Hurts a little bit Pain Location: bilateral feet Pain Descriptors / Indicators: Tightness Pain Intervention(s): Monitored during session;Repositioned    Home Living Family/patient expects to be discharged to:: Private residence Living Arrangements: Spouse/significant other Available Help at Discharge: Family;Available 24 hours/day Type of Home: House Home Access: Ramped entrance   Home Layout: One level Home Equipment: Clinical cytogeneticist - 2 wheels;Grab bars - tub/shower      Prior Function Level of Independence: Needs assistance  Gait / Transfers Assistance Needed: RW for functional mobility ADL's / Homemaking Assistance Needed: wife assist with lower body ADL's  PT Goals (current goals can now be found in the care plan section)      Frequency    Min 3X/week      PT Plan Discharge plan needs to be updated    Co-evaluation              AM-PAC PT "6 Clicks" Daily Activity  Outcome Measure  Difficulty turning over in bed (including adjusting bedclothes, sheets and blankets)?: A Little Difficulty moving from lying on back to sitting on the side of the bed? : A Little Difficulty sitting down on and standing up from a chair with arms  (e.g., wheelchair, bedside commode, etc,.)?: A Lot Help needed moving to and from a bed to chair (including a wheelchair)?: A Little Help needed walking in hospital room?: A Little Help needed climbing 3-5 steps with a railing? : A Lot 6 Click Score: 16    End of Session   Activity Tolerance: Other (comment)(resting between walks due to HR up ot 125) Patient left: in chair;with family/visitor present;Other (comment)(case manager in room)         Time: 1150-1230 PT Time Calculation (min) (ACUTE ONLY): 40 min  Charges:  $Gait Training: 8-22 mins $Therapeutic Activity: 8-22 mins $Self Care/Home Management: [place], [place] [phone] 03/02/2018    Reginia Naas 03/02/2018, 1:25 PM

## 2018-03-02 NOTE — Progress Notes (Signed)
Patient gave educational materials to review prior to discharge in order to review and answer all questions. Patient is forgetful at times so we called [name] during medication administration and reviewed new medications together. Will continue to monitor.

## 2018-03-02 NOTE — Progress Notes (Addendum)
Pharmacy Antibiotic Note  [name] is a 77 y.o. male admitted on 02/25/2018 with abdominal wall cellulitis around surgical site - s/p fem-pop bypass 6/19.  Pharmacy has been consulted for cefazolin dosing.  Cr improving 2.3> 1.9 WBC wnl, afebrile, Bcx remain negative, groin,abdominal site improving per MD  New Afib CVR on coreg 25mg  BID, started on apixaban 5mg  BID for anticoagulation / stroke px - dose ok - continue.  Plan: ContinueCefazolin 1g IV q8h    Height: 5\' 11"  (180.3 cm) Weight: 242 lb (109.8 kg) IBW/kg (Calculated) : 75.3  Temp (24hrs), Avg:97.7 F (36.5 C), Min:97.3 F (36.3 C), Max:98 F (36.7 C)  Recent Labs  Lab 02/25/18 1114  02/26/18 0235 02/27/18 0301 02/27/18 1414 02/28/18 0248 03/01/18 0712 03/02/18 0252  WBC 4.7  --  6.3 4.1  --   --   --   --   CREATININE 2.43*   < > 2.25* 2.26* 2.11* 2.01* 1.94* 1.91*   < > = values in this interval not displayed.    Estimated Creatinine Clearance: 40.2 mL/min (A) (by C-G formula based on SCr of 1.91 mg/dL (H)).    Allergies  Allergen Reactions  . Amoxicillin-Pot Clavulanate Nausea Only    Has patient had a PCN reaction causing immediate rash, facial/tongue/throat swelling, SOB or lightheadedness with hypotension: No Has patient had a PCN reaction causing severe rash involving mucus membranes or skin necrosis: No Has patient had a PCN reaction that required hospitalization: No Has patient had a PCN reaction occurring within the last 10 years: No If all of the above answers are "NO", then may proceed with Cephalosporin use.   Marland Kitchen Morphine And Related Other (See Comments)    hallucinates    Antimicrobials this admission: Vancomycin x 1 10/4 Cefazolin 10/5 >>   Dose adjustments this admission:   Microbiology results: 10/4 BCx: NGTD 10/4 MRSA PCR: negative  Bonnita Nasuti Pharm.D. CPP, BCPS Clinical Pharmacist [phone] 03/02/2018 7:45 AM

## 2018-03-02 NOTE — Progress Notes (Signed)
Occupational Therapy Treatment Patient Details Name: [name] MRN: [id number] DOB: 03/16/1941 Today's Date: 03/02/2018    History of present illness 77 y.o. male admitted with edema, dyspnea, CHF exacerbation. PMHx: fem pop June 2019, cellulitis, CAD s/p CABG in 9983, chronic diastolic heart failure, bilateral carotid artery disease s/p endarterectomy, PAD with claudication s/p aortobifemoral bypass, HTN, CKD, and HLD   OT comments  Pt progressing towards established OT goals. Pt performing functional mobility in hallway with RW and Min Guard A for safety. Pt reporting fatigue and SOB and requesting to sit; pt requiring one seated rest breaks. Pt requiring Min guard A for simulated toilet transfer. Pt HR at 121-23 and Spo2 95%-91% on RA during activity. Updated dc recommendation to home with [facility] and will continue to follow acutely as admitted.    Follow Up Recommendations  Home health OT;Supervision/Assistance - 24 hour    Equipment Recommendations  Other (comment)(Defer to next venue)    Recommendations for Other Services PT consult    Precautions / Restrictions Precautions Precautions: Fall Restrictions Weight Bearing Restrictions: No       Mobility Bed Mobility               General bed mobility comments: In recliner upon arrival  Transfers Overall transfer level: Needs assistance Equipment used: Rolling walker (2 wheeled) Transfers: Sit to/from Stand Sit to Stand: Min guard         General transfer comment: Min Guard A for safety    Balance Overall balance assessment: Needs assistance   Sitting balance-Leahy Scale: Good     Standing balance support: During functional activity Standing balance-Leahy Scale: Poor Standing balance comment: stood to rinse mouth with mouthwash and water leaning over sink with minguard and at least single UE support                           ADL either performed or assessed with clinical judgement   ADL  Overall ADL's : Needs assistance/impaired                         Toilet Transfer: Ambulation;RW;Min guard(Simulated to recliner) Armed forces technical officer Details (indicate cue type and reason): Min Guard A for safety         Functional mobility during ADLs: Min guard;Rolling walker General ADL Comments: Pt performing functional mobility in hallway and simulated toilet transfer at [employer] level for safety. HR 121-123. SpO2 91-95% on RA. Pt requiring seated rest break during activity due to fatgue.     Vision       Perception     Praxis      Cognition Arousal/Alertness: Awake/alert Behavior During Therapy: WFL for tasks assessed/performed Overall Cognitive Status: Within Functional Limits for tasks assessed                                 General Comments: ST memory deficits and word finding. Reported baseline        Exercises     Shoulder Instructions       General Comments SpO2 91-95% on RA.     Pertinent Vitals/ Pain       Pain Assessment: Faces Faces Pain Scale: Hurts a little bit Pain Location: bilateral feet Pain Descriptors / Indicators: Tightness Pain Intervention(s): Monitored during session;Limited activity within patient's tolerance;Repositioned  Home Living Family/patient expects to be discharged to::  Private residence Living Arrangements: Spouse/significant other Available Help at Discharge: Family;Available 24 hours/day Type of Home: House Home Access: Ramped entrance     Home Layout: One level     Bathroom Shower/Tub: Occupational psychologist: Handicapped height     Home Equipment: Clinical cytogeneticist - 2 wheels;Grab bars - tub/shower          Prior Functioning/Environment Level of Independence: Needs assistance  Gait / Transfers Assistance Needed: RW for functional mobility ADL's / Homemaking Assistance Needed: wife assist with lower body ADL's       Frequency  Min 2X/week        Progress Toward  Goals  OT Goals(current goals can now be found in the care plan section)  Progress towards OT goals: Progressing toward goals  Acute Rehab OT Goals Patient Stated Goal: "Go to my granddaughter's swim meet." OT Goal Formulation: With patient/family Time For Goal Achievement: 03/14/18 Potential to Achieve Goals: Good ADL Goals Pt Will Perform Grooming: with modified independence;standing Pt Will Perform Lower Body Dressing: with modified independence;sit to/from stand Pt Will Transfer to Toilet: with modified independence;ambulating;bedside commode Pt Will Perform Toileting - Clothing Manipulation and hygiene: with modified independence;sit to/from stand  Plan Discharge plan remains appropriate    Co-evaluation                 AM-PAC PT "6 Clicks" Daily Activity     Outcome Measure   Help from another person eating meals?: None Help from another person taking care of personal grooming?: A Little Help from another person toileting, which includes using toliet, bedpan, or urinal?: A Little Help from another person bathing (including washing, rinsing, drying)?: A Little Help from another person to put on and taking off regular upper body clothing?: A Little Help from another person to put on and taking off regular lower body clothing?: A Little 6 Click Score: 19    End of Session Equipment Utilized During Treatment: Rolling walker;Oxygen;Gait belt  OT Visit Diagnosis: Unsteadiness on feet (R26.81);Other abnormalities of gait and mobility (R26.89);Muscle weakness (generalized) (M62.81);Pain Pain - Right/Left: Left Pain - part of body: Ankle and joints of foot   Activity Tolerance Patient tolerated treatment well   Patient Left in chair;with call bell/phone within reach;with chair alarm set;with family/visitor present   Nurse Communication Mobility status        Time: 2778-2423 OT Time Calculation (min): 28 min  Charges: OT General Charges $OT Visit: 1 Visit OT  Treatments $Self Care/Home Management : 23-37 mins  [place], OTR/L Acute Rehab Pager: [phone] Office: [place] 03/02/2018, 4:58 PM

## 2018-03-02 NOTE — Progress Notes (Signed)
Inpatient Rehabilitation  Met with patient and spouse at bedside to discuss Dr. Serita Grit recommendation for home with outpatient level of therapy.  Answered questions about current level in therapy, requiring Min gaurd for transfers and Min guard for ambulation means that he is doing too well for our program.  He is not requiring significant physical assist, so family can safely provide current level of assist at home.  Patient tangential, wanting to tell me how he was unhappy with therapy he has received at SNF in the past as well as home health and that he has a NuStep he can use at his [employer].  Explained that outpatient therapy will be most appropriate building his endurance and balance.  They were appreciative of information.  Notified nurse case Freight forwarder.  Will sign off at this time.    Carmelia Roller., CCC/SLP Admission Coordinator  [facility]  Cell [phone]

## 2018-03-02 NOTE — Care Management (Signed)
CM met with pt and wife and discussed discharge planning at length.  Pt and wife are in agreement with discharging Home with HH (SNF declined and pt deemed inappropriate for CIR)- Pt active with [facility] however requested other HH options - CM provided agency list and will follow up in the am.  Pt still on IV lasix drip 

## 2018-03-02 NOTE — Progress Notes (Signed)
Progress Note  Patient Name: [name] Date of Encounter: 03/02/2018  Primary Cardiologist: Sinclair Grooms, MD   Subjective   Better night last night. Got some sleep. No complaints today. CP free. Breathing ok. Overall improved since admit.   Inpatient Medications    Scheduled Meds: . apixaban  5 mg Oral BID  . ascorbic acid  500 mg Oral BID  . aspirin EC  81 mg Oral Daily  . atorvastatin  20 mg Oral QPM  . carvedilol  25 mg Oral BID  . feeding supplement (PRO-STAT SUGAR FREE 64)  30 mL Oral BID  . fenofibrate  54 mg Oral Daily  . gabapentin  300 mg Oral BID  . insulin aspart  0-9 Units Subcutaneous TID WC  . levothyroxine  100 mcg Oral QAC breakfast  . mouth rinse  15 mL Mouth Rinse BID  . multivitamin with minerals  1 tablet Oral Daily  . tamsulosin  0.4 mg Oral QODAY  . vitamin B-12  500 mcg Oral Daily   Continuous Infusions: . sodium chloride Stopped (03/01/18 0559)  . furosemide Stopped (03/02/18 0917)   PRN Meds: sodium chloride, acetaminophen, polyvinyl alcohol, sodium chloride, temazepam   Vital Signs    Vitals:   03/02/18 0800 03/02/18 0814 03/02/18 0900 03/02/18 1000  BP:  125/69    Pulse: (!) 128  85 81  Resp: (!) 21  (!) 24 15  Temp:  98.3 F (36.8 C)    TempSrc:  Oral    SpO2: 90%  96% 97%  Weight:      Height:        Intake/Output Summary (Last 24 hours) at 03/02/2018 1038 Last data filed at 03/02/2018 1000 Gross per 24 hour  Intake 1734.12 ml  Output 1570 ml  Net 164.12 ml   Filed Weights   02/28/18 0322 03/01/18 0423 03/02/18 0629  Weight: 110.7 kg 110 kg 109.8 kg    Telemetry    Afib/flutter 80s - Personally Reviewed  ECG    New onset afib - Personally Reviewed  Physical Exam   GEN: moderately obese elderly WM in No acute distress.   Neck: No JVD Cardiac: irregularly irregular rhythm, regular rate Respiratory: Clear to auscultation bilaterally. GI: Soft, nontender, non-distended  MS: 1-2+ pretibial pitting  edema, L>R Neuro:  Nonfocal  Psych: Normal affect   Labs    Chemistry Recent Labs  Lab 02/28/18 0248 03/01/18 0712 03/02/18 0252  NA 133* 136 135  K 4.0 3.0* 3.9  CL 89* 89* 91*  CO2 33* 34* 32  GLUCOSE 148* 127* 149*  BUN 74* 69* 75*  CREATININE 2.01* 1.94* 1.91*  CALCIUM 8.9 9.1 9.0  GFRNONAA 30* 31* 32*  GFRAA 35* 36* 37*  ANIONGAP 11 13 12      Hematology Recent Labs  Lab 02/25/18 1114 02/26/18 0235 02/27/18 0301  WBC 4.7 6.3 4.1  RBC 3.82* 3.53* 3.52*  3.52*  HGB 11.3* 10.5* 10.5*  HCT 34.8* 32.0* 32.7*  MCV 91.1 90.7 92.9  MCH 29.6 29.7 29.8  MCHC 32.5 32.8 32.1  RDW 16.6* 16.6* 16.7*  PLT 188 164 185    Cardiac EnzymesNo results for input(s): TROPONINI in the last 168 hours.  Recent Labs  Lab 02/25/18 1136  TROPIPOC 0.01     BNP Recent Labs  Lab 02/24/18 1243 02/25/18 1340  BNP  --  1,809.7*  PROBNP 17,251*  --      DDimer No results for input(s): DDIMER in the last  168 hours.   Radiology    No results found.  Cardiac Studies   2D echocardiogram (02/09/2018)  Study Conclusions  - Left ventricle: The cavity size was normal. There was moderate concentric hypertrophy. Systolic function was mildly to moderately reduced. The estimated ejection fraction was in the range of 40% to 45%. Anterior hypokinesis. Grade 2 DD with high LV filling pressure. - Aortic valve: Calcified without stenosis. Mean gradient (S): 6 mm Hg. - Left atrium: Moderately dilated. - Right atrium: The atrium was mildly dilated. - Inferior vena cava: The vessel was normal in size. The respirophasic diameter changes were in the normal range (>= 50%), consistent with normal central venous pressure.  Impressions:  - Technically difficult study. LVEF 40-45%, more significant anterior hypokinesis, grade 2 DD, high LV filling pressure, moderate LAE, mild RAE.  Patient Profile     [name] a 77 y.o.malewith a hx of CAD s/p CABG  in 8675, chronic diastolic heart failure, bilateral carotid artery disease s/p endarterectomy, PAD with claudication s/p aortobifemoral bypass, HTN, CKD, and HLDwho is being seen today for the evaluation ofcombined systolic and diastolic CHFand worsening renal function, as well as new LV dysfunction and new onset atrial fibrillation (02/26/18)at the request of Dr. Francia Greaves, Internal Medicine.  Assessment & Plan    1: Acute on chronic systolic and diastolic heart failure:recent 2D echo showed EF of 40 to 45% with grade 2 diastolic dysfunction.Compared to prior echo in 2016, EF is reduced (previously 50-55%). Pt presented with volume overload and elevated BNP at 1,809. Also w/an elevated serum creatinine (2.3 on admit).   Good response to IV diuretics. Volume improved but some lower extremity edema still present. Breathing improved. 2L of urine out in past 24 hrs, however total Net I/Os not accurate due to issues with incontinence and inability to tolerate condom cath some during admission. Total weight down from 250 lb to 242 lb. Renal function significantly improved with diuresis. Continue IV Lasix, and hopeful transition back to PO torsemide soon. Continue to monitor renal function and electrolytes closely. Daily weights and low sodium diet.   2: Acute on chronic kidney disease-baseline creatinine 1.3-1.4 with arecentincrease of serum creatinine to 2.3 on admit. Renal function gradually improving. SCr drifting down , 2.3>>2.26>>2.11>>2.01>>1.94>>1.91 today.Continue to followwhile diuresing.  3: Peripheral arterial disease. Status post remote aortobifem with left common femoral endarterectomy and femoropopliteal bypass grafting. followed by vascular surgery.   4: Coronary artery disease-history of remote CABG. Newly diminished ejection fraction (40-45%, previously 50-55%) and WMA w/ more significant anterior hypokinesis on echo. Given his start of anticoagulation, lack of symptoms, and  negative troponin, would be inclined to have him follow up with Dr. Tamala Julian as an outpatient to evaluate his symptoms and allow him to recover from this acute episode. Right and left heart catheterization can be discussed at that time. For now, continue medical management w/ ASA, statin and?blocker.   5: Hypokalemia - resolved with supplementation. WNL today at 3.9.   6: Atrial fibrillation-this is a new finding.First documented occurrence was night of 02/26/18. He is fairly asymptomatic and doing ok with rate control. His?blocker was titrated up this admit, Coreg 25 mg BID. Rate is controlled. He has also been started on anticoagulation w/ Eliquis 5 mg BID for anticoagulation. tolerating ok. Will continue on discharge.   7. Cellulitis:left LE/ groin. Afebrile. He was treated with antibiotics this admission. Improved. Further Management per IM.   8. HTN: controlled on current regimen.  BP this morning 125/69  this morning.   9. DM: management per IM.   For questions or updates, please contact [facility] Please consult [email or web address] for contact info under        Signed, Lyda Jester, PA-C  03/02/2018, 10:38 AM

## 2018-03-03 ENCOUNTER — Telehealth: Payer: Self-pay | Admitting: Cardiology

## 2018-03-03 LAB — BASIC METABOLIC PANEL
Anion gap: 10 (ref 5–15)
BUN: 80 mg/dL — AB (ref 8–23)
CHLORIDE: 91 mmol/L — AB (ref 98–111)
CO2: 33 mmol/L — ABNORMAL HIGH (ref 22–32)
Calcium: 9 mg/dL (ref 8.9–10.3)
Creatinine, Ser: 2.06 mg/dL — ABNORMAL HIGH (ref 0.61–1.24)
GFR calc Af Amer: 34 mL/min — ABNORMAL LOW (ref >60)
GFR, EST NON AFRICAN AMERICAN: 29 mL/min — AB (ref >60)
GLUCOSE: 138 mg/dL — AB (ref 70–99)
POTASSIUM: 4.1 mmol/L (ref 3.5–5.1)
Sodium: 134 mmol/L — ABNORMAL LOW (ref 135–145)

## 2018-03-03 LAB — GLUCOSE, CAPILLARY
GLUCOSE-CAPILLARY: 131 mg/dL — AB (ref 70–99)
GLUCOSE-CAPILLARY: 189 mg/dL — AB (ref 70–99)

## 2018-03-03 LAB — CULTURE, BLOOD (ROUTINE X 2)
CULTURE: NO GROWTH
SPECIAL REQUESTS: ADEQUATE

## 2018-03-03 MED ORDER — CARVEDILOL 25 MG PO TABS: 25.0000 mg | ORAL_TABLET | Freq: Two times a day (BID) | ORAL | 0 refills | Status: DC

## 2018-03-03 MED ORDER — APIXABAN 5 MG PO TABS: 5.0000 mg | ORAL_TABLET | Freq: Two times a day (BID) | ORAL | 0 refills | Status: DC

## 2018-03-03 MED ORDER — FENOFIBRATE 54 MG PO TABS: 54.0000 mg | ORAL_TABLET | Freq: Every day | ORAL | 0 refills | Status: DC

## 2018-03-03 NOTE — Plan of Care (Signed)

## 2018-03-03 NOTE — Consult Note (Signed)
   [facility] Inpatient Consult   03/03/2018  [name] February 10, 1941 [id number]   Patient was reviewed for multiple admissions and high risk for unplanned readmissions with [facility].    Met with the patient and wife, [name],  regarding the benefits of [facility] services.  Wife states, "we get your information every time he comes in. Explained that [facility] is a covered benefit of insurance. Patient endorses Dr. Lavone Orn as his primary care provider.  Please note that this office provides the transition of care calls and follow up.  Patient denies any issues for complex disease care management.  They state they will have home health.  Patient denies any needs and declines services.  Wife accepted a brochure and 24 hour nurse advise line magnet and encouraged to call.     Explained that [facility] does not interfere with or replace any services arranged by the inpatient care management staff.  Patient declined services with [facility] at this time.  Updated inpatient RNCM of patient declining.  Natividad Brood, RN BSN [facility] Liaison  [phone] business mobile phone Toll free office [phone]

## 2018-03-03 NOTE — Progress Notes (Signed)
Physical Therapy Treatment Patient Details Name: [name] MRN: [id number] DOB: November 08, 1940 Today's Date: 03/03/2018    History of Present Illness 77 y.o. male admitted with edema, dyspnea, CHF exacerbation. PMHx: fem pop June 2019, cellulitis, CAD s/p CABG in 1761, chronic diastolic heart failure, bilateral carotid artery disease s/p endarterectomy, PAD with claudication s/p aortobifemoral bypass, HTN, CKD, and HLD    PT Comments    Pt pleasant on arrival today, with increased sense of humor and spirit. Pt able to complete two gait trials of 200 ft with RW and min guard with seated rest break in between. Pt HR 120-130 throughout session. Pt even engaged in "race" with other pt in the hallway. Pt with increased balance and foot clearance during swing phase of gait with use of L AFO. Pt encouraged to continue HEP and working with HHPT on personal goals like increasing walking distance.        Follow Up Recommendations  Home health PT;Supervision/Assistance - 24 hour     Equipment Recommendations  None recommended by PT    Recommendations for Other Services       Precautions / Restrictions Precautions Precautions: Fall Required Braces or Orthoses: Other Brace/Splint(L AFO ) Restrictions Weight Bearing Restrictions: No    Mobility  Bed Mobility               General bed mobility comments: in recliner on arrival   Transfers Overall transfer level: Needs assistance Equipment used: Rolling walker (2 wheeled) Transfers: Sit to/from Stand Sit to Stand: Min guard         General transfer comment: min guard for safety. two sit to stand trials. increased time and swaying momentum to power up.   Ambulation/Gait Ambulation/Gait assistance: Min guard Gait Distance (Feet): 200 Feet(two trials of 200 )   Gait Pattern/deviations: Step-through pattern;Decreased stride length;Trunk flexed;Wide base of support Gait velocity: decreased Gait velocity interpretation: 1.31 -  2.62 ft/sec, indicative of limited community ambulator General Gait Details: Two trials of 245ft each with 3 min seated rest break inbetween. HR 120-130 throughout. Gait with shoes and LLE AFO (foot drop), RW and min guard for balance. cues for upright posture in RW and positioning closer to walker. Turning turns pt would pick RW up off floor, cued to leave RW on floor and turn with it, pt able to demonstrate proper technique twice.    Stairs             Wheelchair Mobility    Modified Rankin (Stroke Patients Only)       Balance Overall balance assessment: Needs assistance         Standing balance support: During functional activity Standing balance-Leahy Scale: Poor Standing balance comment: single UE support on RW for standing balance.                             Cognition Arousal/Alertness: Awake/alert Behavior During Therapy: WFL for tasks assessed/performed Overall Cognitive Status: Within Functional Limits for tasks assessed                                        Exercises General Exercises - Lower Extremity Hip Flexion/Marching: AROM;15 reps;Both;Seated    General Comments        Pertinent Vitals/Pain Pain Assessment: Faces Faces Pain Scale: Hurts a little bit Pain Location: bil LE  Pain Descriptors /  Indicators: Tightness;Discomfort;Tender Pain Intervention(s): Limited activity within patient's tolerance;Monitored during session;Repositioned    Home Living                      Prior Function            PT Goals (current goals can now be found in the care plan section) Progress towards PT goals: Progressing toward goals    Frequency    Min 3X/week      PT Plan Current plan remains appropriate    Co-evaluation              AM-PAC PT "6 Clicks" Daily Activity  Outcome Measure  Difficulty turning over in bed (including adjusting bedclothes, sheets and blankets)?: A Little Difficulty moving from  lying on back to sitting on the side of the bed? : A Little Difficulty sitting down on and standing up from a chair with arms (e.g., wheelchair, bedside commode, etc,.)?: A Lot Help needed moving to and from a bed to chair (including a wheelchair)?: A Little Help needed walking in hospital room?: A Little Help needed climbing 3-5 steps with a railing? : A Lot 6 Click Score: 16    End of Session Equipment Utilized During Treatment: Gait belt Activity Tolerance: Patient tolerated treatment well;Patient limited by fatigue Patient left: in chair;with family/visitor present;with call bell/phone within reach Nurse Communication: Mobility status PT Visit Diagnosis: Unsteadiness on feet (R26.81);Other abnormalities of gait and mobility (R26.89);Muscle weakness (generalized) (M62.81);Repeated falls (R29.6);Difficulty in walking, not elsewhere classified (R26.2)     Time: 8546-2703 PT Time Calculation (min) (ACUTE ONLY): 30 min  Charges:  $Gait Training: 8-22 mins $Therapeutic Activity: 8-22 mins                     Samuella Bruin, [place]  Acute Rehab [phone]    Samuella Bruin 03/03/2018, 2:09 PM

## 2018-03-03 NOTE — Discharge Summary (Addendum)
Physician Discharge Summary  STEIN WINDHORST JAS:[id number] DOB: [birth year] DOA: 02/25/2018  PCP: Lavone Orn, MD  Admit date: 02/25/2018 Discharge date: 03/03/2018  Admitted From: Home  Disposition:  Home   Recommendations for Outpatient Follow-up and new medication changes:  1. Follow up with Dr. Laurann Montana in 7 days.  2. Carvedilol has been increased to 25 mg bid 3. Patient has been started on apixaban 5 mg bid 4. Fenofibrate dose was decreased 5. Continue torsemide and metolazone.  6. Follow renal function as outpatient 7. Instructed to take metolazone if weight increased 3 lbs in 24 hours or 5 lbs in 7 days. Discharge weight 110.7 kg (244 lbs).   Home Health: Yes   Equipment/Devices: no    Discharge Condition: stable  CODE STATUS: full  Diet recommendation:  Heart healthy and diabetic prudent.   Brief/Interim Summary: 77 year old male who presented with lower extremity edema.  He does have significant past medical history for coronary artery disease status post bypass grafting (4193), diastolic heart failure, carotid artery disease status post endarterectomy, peripheral vascular disease status post aorto bifemoral bypass, hypertension, chronic kidney disease and dyslipidemia.  Patient reported worsening dyspnea, lower extremity edema, weight gain and orthopnea.  He was recently diagnosed with cellulitis of the abdominal wall.  Symptoms persisted despite diuretic dose adjustment as an outpatient.  On his initial physical examination blood pressure 138/64, heart rate 65, temperature 97.7, respiratory rate 19, oxygen saturation 100%.  He had positive JVD, his lungs had decreased breath sounds at bases with positive rales, heart S1-S2 present and rhythmic, no gallops rubs or murmurs, the abdomen was soft nontender, no guarding, mild cellulitis left abdominal wall and positive lower extremity edema.   Sodium 136, potassium 2.6, chloride 88, bicarb 33, glucose 197, BUN 92, creatinine 2.4,  white count 4.7, hemoglobin 11.3, hematocrit 34.8, platelets 188, BNP 1889 troponin 0.01, chest x-ray had diffuse bilateral interstitial infiltrates with bibasilar atelectasis.  EKG sinus rhythm, normal axis, right bundle branch block, septal T wave inversions.  Patient was admitted to the hospital with the working diagnosis of acute decompensation of chronic diastolic heart failure.  1.  Acute on chronic diastolic heart failure decompensation.  Ejection fraction left ventricle 40 to 45% with anterior hypokinesis.  Ischemic cardiomyopathy, status post bypass grafting.  Patient was admitted to the stepdown unit, he received aggressive diuresis with IV furosemide, negative fluid balance was achieved, (-4,036 ml since admission) with significant improvement of his symptoms.  Patient will continue diuretic therapy with 60 mg of furosemide twice daily and metolazone every other day.  Continue beta-blockade with carvedilol, dose has been increased to 25 g twice daily.  No ACE inhibitor due to acute kidney injury.  2.  New onset atrial fibrillation.  Follow-up electrocardiography showed atrial fibrillation rhythm, patient was placed on apixaban for anticoagulation his ChadsVasc score is 6. At discharge continue atrial fibrillation rhythm.   3.  Acute kidney injury on chronic kidney disease stage III.  Patient tolerated well diuresis with high doses of IV furosemide 120 mg twice daily, his urine output over last 24 hours 2400 mL.  His discharge creatinine is 2.6, potassium 4.1, serum bicarbonate 33.  Recommend to have a close follow-up of kidney function as an outpatient.  4.  Type 2 diabetes mellitus.  Patient was placed on insulin sliding scale for glucose coverage and monitoring, his glucose remained controlled. At discharge will continue pioglitazone.   5.  Hypothyroidism.  Continue levothyroxine.  6.  Abdominal wall cellulitis, present on  admission.  Patient received antibiotic therapy in the hospital  with improvement of his symptoms.  7.  Dyslipidemia.  Continue aspirin, atorvastatin and fenofibrate.   Discharge Diagnoses:  Active Problems:   Diabetes mellitus type 2 with complications (HCC)   Essential hypertension   Chronic kidney disease, stage III (moderate) (HCC)   CHF (congestive heart failure) (HCC)   Malnutrition of moderate degree   Coronary artery disease involving coronary bypass graft of native heart without angina pectoris   Benign essential HTN   PAD (peripheral artery disease) ([place])   New onset atrial fibrillation (HCC)   Tachypnea   Hyponatremia    Discharge Instructions   Allergies as of 03/03/2018      Reactions   Amoxicillin-pot Clavulanate Nausea Only   Has patient had a PCN reaction causing immediate rash, facial/tongue/throat swelling, SOB or lightheadedness with hypotension: No Has patient had a PCN reaction causing severe rash involving mucus membranes or skin necrosis: No Has patient had a PCN reaction that required hospitalization: No Has patient had a PCN reaction occurring within the last 10 years: No If all of the above answers are "NO", then may proceed with Cephalosporin use.   Morphine And Related Other (See Comments)   hallucinates      Medication List    STOP taking these medications   cephALEXin 500 MG capsule Commonly known as:  KEFLEX   cetirizine 10 MG tablet Commonly known as:  ZYRTEC   levofloxacin 500 MG tablet Commonly known as:  LEVAQUIN   potassium chloride SA 20 MEQ tablet Commonly known as:  K-DUR,KLOR-CON   senna-docusate 8.6-50 MG tablet Commonly known as:  Senokot-S     TAKE these medications   apixaban 5 MG Tabs tablet Commonly known as:  ELIQUIS Take 1 tablet (5 mg total) by mouth 2 (two) times daily.   aspirin EC 81 MG tablet Take 1 tablet (81 mg total) by mouth daily.   atorvastatin 20 MG tablet Commonly known as:  LIPITOR Take 20 mg by mouth every evening.   carvedilol 25 MG tablet Commonly  known as:  COREG Take 1 tablet (25 mg total) by mouth 2 (two) times daily. What changed:    medication strength  how much to take   fenofibrate 54 MG tablet Take 1 tablet (54 mg total) by mouth daily. What changed:    medication strength  how much to take  when to take this   gabapentin 300 MG capsule Commonly known as:  NEURONTIN Take 300 mg by mouth 2 (two) times daily.   levothyroxine 100 MCG tablet Commonly known as:  SYNTHROID, LEVOTHROID Take 100 mcg by mouth daily before breakfast.   methylcellulose 1 % ophthalmic solution Commonly known as:  ARTIFICIAL TEARS Place 1 drop into both eyes 2 (two) times daily as needed (dry eyes).   metolazone 5 MG tablet Commonly known as:  ZAROXOLYN Take 5 mg by mouth every other day. Take every other day until your appointment Wednesday 9/25   multivitamin with minerals Tabs tablet Take 1 tablet by mouth daily.   nitroGLYCERIN 0.4 MG SL tablet Commonly known as:  NITROSTAT PLACE 1 TABLET UNDER TONGUE EVERY 5 MINUTES AS NEEDED FOR CHEST PAIN. What changed:  See the new instructions.   pioglitazone 30 MG tablet Commonly known as:  ACTOS Take 1 tablet (30 mg total) by mouth daily with lunch.   pseudoephedrine-guaifenesin 60-600 MG 12 hr tablet Commonly known as:  MUCINEX D Take 1 tablet by mouth every 12 (  twelve) hours as needed for congestion.   sodium chloride 0.65 % Soln nasal spray Commonly known as:  OCEAN Place 1 spray into both nostrils 2 (two) times daily as needed for congestion.   tamsulosin 0.4 MG Caps capsule Commonly known as:  FLOMAX Take 0.4 mg by mouth every other day.   temazepam 15 MG capsule Commonly known as:  RESTORIL Take 15 mg by mouth at bedtime as needed for sleep.   torsemide 20 MG tablet Commonly known as:  DEMADEX Take 3 tablets (60 mg total) by mouth 2 (two) times daily.   UNABLE TO FIND Apply 1 application topically every 6 (six) hours as needed (pain). Med Name: *Ibuprofen 15%  Baclofen 1% Lidocaine 10% Gabapentin 3% Topical Cream** To wrist   VICKS SINEX 0.05 % nasal spray Generic drug:  oxymetazoline Place 1 spray into both nostrils 2 (two) times daily as needed for congestion.   VITAMIN B 12 PO Take 500 mcg by mouth daily.   VITAMIN C PO Take 1 tablet by mouth 2 (two) times daily.   zinc gluconate 50 MG tablet Take 50 mg by mouth daily.       Allergies  Allergen Reactions  . Amoxicillin-Pot Clavulanate Nausea Only    Has patient had a PCN reaction causing immediate rash, facial/tongue/throat swelling, SOB or lightheadedness with hypotension: No Has patient had a PCN reaction causing severe rash involving mucus membranes or skin necrosis: No Has patient had a PCN reaction that required hospitalization: No Has patient had a PCN reaction occurring within the last 10 years: No If all of the above answers are "NO", then may proceed with Cephalosporin use.   Marland Kitchen Morphine And Related Other (See Comments)    hallucinates    Consultations:  Cardiology    Procedures/Studies: Dg Chest 2 View  Result Date: 02/25/2018 CLINICAL DATA:  Swelling both extremities.  Prior vascular surgery. EXAM: CHEST - 2 VIEW COMPARISON:  10/11/2017. FINDINGS: Prior CABG. Cardiomegaly with mild bilateral interstitial prominence consistent CHF. No focal infiltrate. No pneumothorax. IMPRESSION: Prior CABG. Cardiomegaly with diffuse bilateral interstitial prominence consistent with CHF. Electronically Signed   By: Marcello Moores  Register   On: 02/25/2018 11:41   Dg Chest Port 1 View  Result Date: 02/28/2018 CLINICAL DATA:  CHF, shortness of breath. EXAM: PORTABLE CHEST 1 VIEW COMPARISON:  PA and lateral chest x-ray of February 25, 2018 FINDINGS: The lungs are adequately inflated. The interstitial markings are coarse. The cardiac silhouette is enlarged. The pulmonary vascularity is engorged. The patient has undergone previous median sternotomy and CABG. There is calcification in the wall of  the aortic arch. There is no significant pleural effusion and no pneumothorax. IMPRESSION: CHF with mild interstitial edema which when compared to the previous study which has worsened slightly since the previous study. This is accentuated by the AP portable technique. No alveolar pneumonia nor pleural effusion. Thoracic aortic atherosclerosis. Electronically Signed   By: David  Martinique M.D.   On: 02/28/2018 07:25       Subjective: Patient is feeling better, dyspnea and edema have improved, no nausea or vomiting, no chest pain or palpitations.   Discharge Exam: Vitals:   03/03/18 0351 03/03/18 0800  BP: (!) 151/73   Pulse: 96 (!) 157  Resp: (!) 22 20  Temp: 98 F (36.7 C)   SpO2: 97% 98%   Vitals:   03/02/18 2342 03/03/18 0351 03/03/18 0625 03/03/18 0800  BP: 131/67 (!) 151/73    Pulse: 89 96  (!) 157  Resp: (!) 23 (!) 22  20  Temp: 97.9 F (36.6 C) 98 F (36.7 C)    TempSrc: Oral Oral    SpO2: 100% 97%  98%  Weight:   110.7 kg   Height:        General: Not in pain or dyspnea Neurology: Awake and alert, non focal  E ENT: mild pallor, no icterus, oral mucosa moist Cardiovascular: No JVD. S1-S2 present, irregularly irregular, no gallops, rubs, or murmurs. ++ pitting lower extremity edema. Pulmonary: vesicular breath sounds bilaterally, adequate air movement, no wheezing, rhonchi or rales. Gastrointestinal. Abdomen portuberant no organomegaly, non tender, no rebound or guarding Skin. No rashes Musculoskeletal: no joint deformities   The results of significant diagnostics from this hospitalization (including imaging, microbiology, ancillary and laboratory) are listed below for reference.     Microbiology: Recent Results (from the past 240 hour(s))  Culture, blood (routine x 2)     Status: None   Collection Time: 02/25/18  1:40 PM  Result Value Ref Range Status   Specimen Description BLOOD RIGHT WRIST  Final   Special Requests   Final    BOTTLES DRAWN AEROBIC AND  ANAEROBIC Blood Culture adequate volume   Culture   Final    NO GROWTH 5 DAYS Performed at [facility], [address], [place], [place] [address]    Report Status 03/03/2018 FINAL  Final  Culture, blood (routine x 2)     Status: None   Collection Time: 02/25/18  1:40 PM  Result Value Ref Range Status   Specimen Description BLOOD SITE NOT SPECIFIED  Final   Special Requests   Final    BOTTLES DRAWN AEROBIC AND ANAEROBIC Blood Culture adequate volume   Culture   Final    NO GROWTH 5 DAYS Performed at [facility] [address], [place], [place] [address]    Report Status 03/02/2018 FINAL  Final  MRSA PCR Screening     Status: None   Collection Time: 02/25/18  6:59 PM  Result Value Ref Range Status   MRSA by PCR NEGATIVE NEGATIVE Final    Comment:        The GeneXpert MRSA Assay (FDA approved for NASAL specimens only), is one component of a comprehensive MRSA colonization surveillance program. It is not intended to diagnose MRSA infection nor to guide or monitor treatment for MRSA infections. Performed at [facility], [place] [address] [place], [place] [address]      Labs: BNP (last 3 results) Recent Labs    02/25/18 1340  BNP 6,967.8*   Basic Metabolic Panel: Recent Labs  Lab 02/27/18 1414 02/28/18 0248 03/01/18 0712 03/02/18 0252 03/03/18 0316  NA 135 133* 136 135 134*  K 3.6 4.0 3.0* 3.9 4.1  CL 88* 89* 89* 91* 91*  CO2 34* 33* 34* 32 33*  GLUCOSE 163* 148* 127* 149* 138*  BUN 77* 74* 69* 75* 80*  CREATININE 2.11* 2.01* 1.94* 1.91* 2.06*  CALCIUM 9.2 8.9 9.1 9.0 9.0  MG 2.3  --   --   --   --    Liver Function Tests: No results for input(s): AST, ALT, ALKPHOS, BILITOT, PROT, ALBUMIN in the last 168 hours. No results for input(s): LIPASE, AMYLASE in the last 168 hours. No results for input(s): AMMONIA in the last 168 hours. CBC: Recent Labs  Lab 02/25/18 1114 02/26/18 0235 02/27/18 0301  WBC 4.7 6.3 4.1  HGB 11.3* 10.5* 10.5*   HCT 34.8* 32.0*  32.7*  MCV 91.1 90.7 92.9  PLT 188 164 185   Cardiac Enzymes: No results for input(s): CKTOTAL, CKMB, CKMBINDEX, TROPONINI in the last 168 hours. BNP: Invalid input(s): POCBNP CBG: Recent Labs  Lab 03/01/18 2044 03/02/18 0815 03/02/18 1254 03/02/18 1746 03/02/18 2119  GLUCAP 187* 143* 132* 152* 225*   D-Dimer No results for input(s): DDIMER in the last 72 hours. Hgb A1c No results for input(s): HGBA1C in the last 72 hours. Lipid Profile No results for input(s): CHOL, HDL, LDLCALC, TRIG, CHOLHDL, LDLDIRECT in the last 72 hours. Thyroid function studies No results for input(s): TSH, T4TOTAL, T3FREE, THYROIDAB in the last 72 hours.  Invalid input(s): FREET3 Anemia work up No results for input(s): VITAMINB12, FOLATE, FERRITIN, TIBC, IRON, RETICCTPCT in the last 72 hours. Urinalysis    Component Value Date/Time   COLORURINE YELLOW 11/15/2017 1004   APPEARANCEUR CLEAR 11/15/2017 1004   LABSPEC 1.008 11/15/2017 1004   PHURINE 6.0 11/15/2017 1004   GLUCOSEU NEGATIVE 11/15/2017 1004   HGBUR MODERATE (A) 11/15/2017 1004   BILIRUBINUR NEGATIVE 11/15/2017 1004   KETONESUR NEGATIVE 11/15/2017 1004   PROTEINUR NEGATIVE 11/15/2017 1004   UROBILINOGEN 4.0 (H) 09/23/2011 1335   NITRITE NEGATIVE 11/15/2017 1004   LEUKOCYTESUR SMALL (A) 11/15/2017 1004   Sepsis Labs Invalid input(s): PROCALCITONIN,  WBC,  LACTICIDVEN Microbiology Recent Results (from the past 240 hour(s))  Culture, blood (routine x 2)     Status: None   Collection Time: 02/25/18  1:40 PM  Result Value Ref Range Status   Specimen Description BLOOD RIGHT WRIST  Final   Special Requests   Final    BOTTLES DRAWN AEROBIC AND ANAEROBIC Blood Culture adequate volume   Culture   Final    NO GROWTH 5 DAYS Performed at [facility], [place] [address], [place], [place] [address]    Report Status 03/03/2018 FINAL  Final  Culture, blood (routine x 2)     Status: None   Collection Time: 02/25/18   1:40 PM  Result Value Ref Range Status   Specimen Description BLOOD SITE NOT SPECIFIED  Final   Special Requests   Final    BOTTLES DRAWN AEROBIC AND ANAEROBIC Blood Culture adequate volume   Culture   Final    NO GROWTH 5 DAYS Performed at [facility] [address], [place], [place] [address]    Report Status 03/02/2018 FINAL  Final  MRSA PCR Screening     Status: None   Collection Time: 02/25/18  6:59 PM  Result Value Ref Range Status   MRSA by PCR NEGATIVE NEGATIVE Final    Comment:        The GeneXpert MRSA Assay (FDA approved for NASAL specimens only), is one component of a comprehensive MRSA colonization surveillance program. It is not intended to diagnose MRSA infection nor to guide or monitor treatment for MRSA infections. Performed at [facility], [place] [address], [place], [place] [address]      Time coordinating discharge: 45 minutes  SIGNED:   Tawni Millers, MD  [facility] 03/03/2018, 8:04 AM Pager [phone]  If 7PM-7AM, please contact night-coverage [email or web address] Password [email or web address]

## 2018-03-03 NOTE — Progress Notes (Signed)
Discharge instructions (including medications) discussed with and copy provided to patient/caregiver 

## 2018-03-03 NOTE — Clinical Social Work Note (Signed)
PT now recommending HHPT and patient has orders to discharge home today.  CSW signing off.  Dayton Scrape, [place]

## 2018-03-03 NOTE — Care Management Note (Addendum)
Case Management Note  Patient Details  Name: KELEN LAURA MRN: [id number] Date of Birth: November 12, 1940  Subjective/Objective:     Pt admitted with heart failure             Action/Plan:  PTA Independent from home.  CIR recommended however deemed inappropriate - CSW following as back up plan.   Expected Discharge Date:  03/03/18               Expected Discharge Plan:  Skilled Nursing Facility  In-House Referral:  Clinical Social Work  Discharge planning Services  CM Consult  Post Acute Care Choice:    Choice offered to:  Patient, Spouse  DME Arranged:    DME Agency:     HH Arranged:  RN, PT [place] Agency:  [facility]  Status of Service:  Completed, signed off  If discussed at [place] of Stay Meetings, dates discussed:    Additional Comments: 03/03/2018  Pt will discharge home today - recommendation now is HH.  Pt only interested in [facility] and PT - agency list choice given and pt chose [facility].  [facility] contacted and referral accepted.  CM provided pt free 30 day Eliquis card and informed pt of ongoing copay.  Pt educated about the importance of daily weights and low salt intake.  Pts wife confirms she will be with pt 24/7 at discharge as recommended.  03/01/18 CM informed CSW that pt is nearing stability for discharge to SNF.  CM will continue to follow for discharge needs Maryclare Labrador, RN 03/03/2018, 9:01 AM

## 2018-03-03 NOTE — Telephone Encounter (Signed)
New Message   Patient has a TOC appt scheduled 10/24 with Cecilie Kicks.

## 2018-03-03 NOTE — Progress Notes (Signed)
Progress Note  Patient Name: [name] Date of Encounter: 03/03/2018  Primary Cardiologist: Sinclair Grooms, MD   Subjective   Anxious to go home. Feels that breathing and legs are vastly improved. He and his wife had many questions, all answered.  Inpatient Medications    Scheduled Meds: . apixaban  5 mg Oral BID  . ascorbic acid  500 mg Oral BID  . aspirin EC  81 mg Oral Daily  . atorvastatin  20 mg Oral QPM  . carvedilol  25 mg Oral BID  . feeding supplement (PRO-STAT SUGAR FREE 64)  30 mL Oral BID  . fenofibrate  54 mg Oral Daily  . gabapentin  300 mg Oral BID  . insulin aspart  0-9 Units Subcutaneous TID WC  . levothyroxine  100 mcg Oral QAC breakfast  . mouth rinse  15 mL Mouth Rinse BID  . multivitamin with minerals  1 tablet Oral Daily  . tamsulosin  0.4 mg Oral QODAY  . torsemide  60 mg Oral BID  . vitamin B-12  500 mcg Oral Daily   Continuous Infusions: . sodium chloride Stopped (03/01/18 0559)   PRN Meds: sodium chloride, acetaminophen, polyvinyl alcohol, sodium chloride, temazepam   Vital Signs    Vitals:   03/03/18 0625 03/03/18 0800 03/03/18 0830 03/03/18 1144  BP:   103/69 125/63  Pulse:  (!) 157 (!) 121 64  Resp:  20 20 14   Temp:   97.9 F (36.6 C) 97.8 F (36.6 C)  TempSrc:   Oral Oral  SpO2:  98% 99% 96%  Weight: 110.7 kg     Height:        Intake/Output Summary (Last 24 hours) at 03/03/2018 1151 Last data filed at 03/03/2018 0900 Gross per 24 hour  Intake 480 ml  Output 2400 ml  Net -1920 ml   Filed Weights   03/01/18 0423 03/02/18 0629 03/03/18 0625  Weight: 110 kg 109.8 kg 110.7 kg    Telemetry    Afib/flutter with artifact, rate controlled - Personally Reviewed  ECG    No new - Personally Reviewed  Physical Exam   GEN: moderately obese elderly WM in No acute distress.   Neck: No JVD Cardiac: irregularly irregular rhythm, regular rate Respiratory: Clear to auscultation bilaterally. GI: Soft, nontender,  non-distended  MS: 1-2+ pretibial pitting edema, L>R, chronic stasis changes Neuro:  Nonfocal  Psych: Normal affect   Labs    Chemistry Recent Labs  Lab 03/01/18 0712 03/02/18 0252 03/03/18 0316  NA 136 135 134*  K 3.0* 3.9 4.1  CL 89* 91* 91*  CO2 34* 32 33*  GLUCOSE 127* 149* 138*  BUN 69* 75* 80*  CREATININE 1.94* 1.91* 2.06*  CALCIUM 9.1 9.0 9.0  GFRNONAA 31* 32* 29*  GFRAA 36* 37* 34*  ANIONGAP 13 12 10      Hematology Recent Labs  Lab 02/25/18 1114 02/26/18 0235 02/27/18 0301  WBC 4.7 6.3 4.1  RBC 3.82* 3.53* 3.52*  3.52*  HGB 11.3* 10.5* 10.5*  HCT 34.8* 32.0* 32.7*  MCV 91.1 90.7 92.9  MCH 29.6 29.7 29.8  MCHC 32.5 32.8 32.1  RDW 16.6* 16.6* 16.7*  PLT 188 164 185    Cardiac EnzymesNo results for input(s): TROPONINI in the last 168 hours.  Recent Labs  Lab 02/25/18 1136  TROPIPOC 0.01     BNP Recent Labs  Lab 02/24/18 1243 02/25/18 1340  BNP  --  1,809.7*  PROBNP 17,251*  --  DDimer No results for input(s): DDIMER in the last 168 hours.   Radiology    No results found.  Cardiac Studies   2D echocardiogram (02/09/2018)  Study Conclusions  - Left ventricle: The cavity size was normal. There was moderate concentric hypertrophy. Systolic function was mildly to moderately reduced. The estimated ejection fraction was in the range of 40% to 45%. Anterior hypokinesis. Grade 2 DD with high LV filling pressure. - Aortic valve: Calcified without stenosis. Mean gradient (S): 6 mm Hg. - Left atrium: Moderately dilated. - Right atrium: The atrium was mildly dilated. - Inferior vena cava: The vessel was normal in size. The respirophasic diameter changes were in the normal range (>= 50%), consistent with normal central venous pressure.  Impressions:  - Technically difficult study. LVEF 40-45%, more significant anterior hypokinesis, grade 2 DD, high LV filling pressure, moderate LAE, mild RAE.  Patient Profile       [name] a 77 y.o.malewith a hx of CAD s/p CABG in 4401, chronic diastolic heart failure, bilateral carotid artery disease s/p endarterectomy, PAD with claudication s/p aortobifemoral bypass, HTN, CKD, and HLDwho is being seen today for the evaluation ofcombined systolic and diastolic CHFand worsening renal function, as well as new LV dysfunction and new onset atrial fibrillation (02/26/18)at the request of Dr. Francia Greaves, Internal Medicine.  Assessment & Plan    1: Acute on chronic systolic and diastolic heart failure:recent 2D echo showed EF of 40 to 45% with grade 2 diastolic dysfunction.Compared to prior echo in 2016, EF is reduced (previously 50-55%). Pt presented with volume overload and elevated BNP at 1,809. Also w/an elevated serum creatinine (2.3 on admit).  -creatinine stable, potassium stable. Reported as 1.5 L negative on oral medications but weight is up slightly. Ins/outs and weights have not been agreeing, and has he has a urine collection system the outs should be most accurate -weight overall went from 113.4 lg to 110.7 kg. Noted as negative >4 L this admission but likely more -tolerating PO torsemide. Discussed that metolazone should not be used regularly but only as instructed.  -reviewed need for daily weights, instructions on when to call Dr. Thompson Caul office.  -low sodium diet, fluid restriction  2: Acute on chronic kidney disease-baseline creatinine 1.3-1.4 with arecentincrease of serum creatinine to 2.3 on admit. SCr 2.3>>2.26>>2.11>>2.01>>1.94>>1.91>>2.06  today.  3: Peripheral arterial disease. Status post remote aortobifem with left common femoral endarterectomy and femoropopliteal bypass grafting. Followed by vascular surgery.   4: Coronary artery disease-history of remote CABG. Newly diminished ejection fraction (40-45%, previously 50-55%) and WMA w/ more significant anterior hypokinesis on echo. Given his start of anticoagulation, lack of  symptoms, and negative troponin, would be inclined to have him follow up with Dr. Tamala Julian as an outpatient to evaluate his symptoms and allow him to recover from this acute episode. Right and left heart catheterization can be discussed at that time. For now, continue medical management w/ ASA, statin and?blocker.   5: Hypokalemia - resolved with supplementation. WNL today at 4.1.   6: Atrial fibrillation-this is a new finding.First documented occurrence was night of 02/26/18. He is fairly asymptomatic and doing ok with rate control.  -continue Coreg 25 mg BID -continue Eliquis 5 mg BID for anticoagulation.  7. Cellulitis:left LE/ groin. Afebrile. He was treated with antibiotics this admission. Improved. Further Management per IM.   8. HTN: controlled  9. DM: management per IM.  [facility] will sign off in anticipation of discharge.   Medication Recommendations:  As  above Other recommendations (labs, testing, etc):  BMP in 1 week Follow up as an outpatient: with Dr. Tamala Julian, we will arrange   For questions or updates, please contact [facility] Please consult [email or web address] for contact info under    Signed, Buford Dresser, MD  03/03/2018, 11:51 AM

## 2018-03-04 ENCOUNTER — Telehealth: Payer: Self-pay | Admitting: Interventional Cardiology

## 2018-03-04 DIAGNOSIS — N183 Chronic kidney disease, stage 3 (moderate): Secondary | ICD-10-CM | POA: Diagnosis not present

## 2018-03-04 DIAGNOSIS — I5033 Acute on chronic diastolic (congestive) heart failure: Secondary | ICD-10-CM | POA: Diagnosis not present

## 2018-03-04 DIAGNOSIS — I5022 Chronic systolic (congestive) heart failure: Secondary | ICD-10-CM | POA: Diagnosis not present

## 2018-03-04 DIAGNOSIS — I130 Hypertensive heart and chronic kidney disease with heart failure and stage 1 through stage 4 chronic kidney disease, or unspecified chronic kidney disease: Secondary | ICD-10-CM | POA: Diagnosis not present

## 2018-03-04 DIAGNOSIS — I4891 Unspecified atrial fibrillation: Secondary | ICD-10-CM | POA: Diagnosis not present

## 2018-03-04 DIAGNOSIS — E1122 Type 2 diabetes mellitus with diabetic chronic kidney disease: Secondary | ICD-10-CM | POA: Diagnosis not present

## 2018-03-04 NOTE — Telephone Encounter (Signed)
Spoke with wife and she asked about K+ being d/c'ed.  Advised her if they told him not to take it then continue with that plan and we would check labs when he was back in the office.  She asked about Metolazone instructions.  Went over those and wife verbalized understanding.  Will route to Dr. Tamala Julian to see if he is ok with pt being off K+ for now?

## 2018-03-04 NOTE — Telephone Encounter (Signed)
Patient contacted regarding discharge from [facility] on March 03, 2018.  Patient understands to follow up with provider Cecilie Kicks, NP on March 17, 2018 at 2:30pm at [facility]. Patient understands discharge instructions? yes Patient understands medications and regiment? yes Patient understands to bring all medications to this visit? yes

## 2018-03-04 NOTE — Telephone Encounter (Signed)
Left message to call back  

## 2018-03-04 NOTE — Telephone Encounter (Signed)
New Message:     Please call pt's wife. Pt was discharged from the hospital yesterday. She have questions about his medicine, confused about  Instructions.

## 2018-03-05 DIAGNOSIS — I4891 Unspecified atrial fibrillation: Secondary | ICD-10-CM | POA: Diagnosis not present

## 2018-03-05 DIAGNOSIS — E1122 Type 2 diabetes mellitus with diabetic chronic kidney disease: Secondary | ICD-10-CM | POA: Diagnosis not present

## 2018-03-05 DIAGNOSIS — I130 Hypertensive heart and chronic kidney disease with heart failure and stage 1 through stage 4 chronic kidney disease, or unspecified chronic kidney disease: Secondary | ICD-10-CM | POA: Diagnosis not present

## 2018-03-05 DIAGNOSIS — N183 Chronic kidney disease, stage 3 (moderate): Secondary | ICD-10-CM | POA: Diagnosis not present

## 2018-03-05 DIAGNOSIS — I5022 Chronic systolic (congestive) heart failure: Secondary | ICD-10-CM | POA: Diagnosis not present

## 2018-03-05 DIAGNOSIS — I5033 Acute on chronic diastolic (congestive) heart failure: Secondary | ICD-10-CM | POA: Diagnosis not present

## 2018-03-06 NOTE — Telephone Encounter (Signed)
Going with game plan at discharge.

## 2018-03-07 ENCOUNTER — Inpatient Hospital Stay (HOSPITAL_COMMUNITY)
Admission: EM | Admit: 2018-03-07 | Discharge: 2018-03-11 | DRG: 378 | Disposition: A | Discharge status: Home / Self Care | Payer: Medicare HMO | Attending: Internal Medicine | Admitting: Internal Medicine

## 2018-03-07 ENCOUNTER — Telehealth: Payer: Self-pay | Admitting: Interventional Cardiology

## 2018-03-07 ENCOUNTER — Encounter (HOSPITAL_COMMUNITY): Payer: Self-pay | Admitting: Pharmacy Technician

## 2018-03-07 ENCOUNTER — Other Ambulatory Visit: Payer: Self-pay

## 2018-03-07 DIAGNOSIS — E1151 Type 2 diabetes mellitus with diabetic peripheral angiopathy without gangrene: Secondary | ICD-10-CM | POA: Diagnosis present

## 2018-03-07 DIAGNOSIS — E118 Type 2 diabetes mellitus with unspecified complications: Secondary | ICD-10-CM | POA: Diagnosis present

## 2018-03-07 DIAGNOSIS — I4891 Unspecified atrial fibrillation: Secondary | ICD-10-CM | POA: Diagnosis not present

## 2018-03-07 DIAGNOSIS — Z8719 Personal history of other diseases of the digestive system: Secondary | ICD-10-CM

## 2018-03-07 DIAGNOSIS — I2510 Atherosclerotic heart disease of native coronary artery without angina pectoris: Secondary | ICD-10-CM | POA: Diagnosis present

## 2018-03-07 DIAGNOSIS — I5030 Unspecified diastolic (congestive) heart failure: Secondary | ICD-10-CM | POA: Diagnosis not present

## 2018-03-07 DIAGNOSIS — E039 Hypothyroidism, unspecified: Secondary | ICD-10-CM | POA: Diagnosis present

## 2018-03-07 DIAGNOSIS — G8929 Other chronic pain: Secondary | ICD-10-CM

## 2018-03-07 DIAGNOSIS — K921 Melena: Secondary | ICD-10-CM | POA: Diagnosis not present

## 2018-03-07 DIAGNOSIS — Z7982 Long term (current) use of aspirin: Secondary | ICD-10-CM | POA: Diagnosis not present

## 2018-03-07 DIAGNOSIS — E785 Hyperlipidemia, unspecified: Secondary | ICD-10-CM | POA: Diagnosis not present

## 2018-03-07 DIAGNOSIS — T39015A Adverse effect of aspirin, initial encounter: Secondary | ICD-10-CM | POA: Diagnosis not present

## 2018-03-07 DIAGNOSIS — Z7901 Long term (current) use of anticoagulants: Secondary | ICD-10-CM

## 2018-03-07 DIAGNOSIS — Z9841 Cataract extraction status, right eye: Secondary | ICD-10-CM | POA: Diagnosis not present

## 2018-03-07 DIAGNOSIS — Z87891 Personal history of nicotine dependence: Secondary | ICD-10-CM

## 2018-03-07 DIAGNOSIS — I130 Hypertensive heart and chronic kidney disease with heart failure and stage 1 through stage 4 chronic kidney disease, or unspecified chronic kidney disease: Secondary | ICD-10-CM | POA: Diagnosis present

## 2018-03-07 DIAGNOSIS — K922 Gastrointestinal hemorrhage, unspecified: Secondary | ICD-10-CM | POA: Diagnosis not present

## 2018-03-07 DIAGNOSIS — I5033 Acute on chronic diastolic (congestive) heart failure: Secondary | ICD-10-CM | POA: Diagnosis not present

## 2018-03-07 DIAGNOSIS — D649 Anemia, unspecified: Secondary | ICD-10-CM

## 2018-03-07 DIAGNOSIS — N179 Acute kidney failure, unspecified: Secondary | ICD-10-CM | POA: Diagnosis not present

## 2018-03-07 DIAGNOSIS — Z9842 Cataract extraction status, left eye: Secondary | ICD-10-CM

## 2018-03-07 DIAGNOSIS — E1122 Type 2 diabetes mellitus with diabetic chronic kidney disease: Secondary | ICD-10-CM | POA: Diagnosis not present

## 2018-03-07 DIAGNOSIS — Z87442 Personal history of urinary calculi: Secondary | ICD-10-CM

## 2018-03-07 DIAGNOSIS — K3189 Other diseases of stomach and duodenum: Secondary | ICD-10-CM | POA: Diagnosis not present

## 2018-03-07 DIAGNOSIS — Z8679 Personal history of other diseases of the circulatory system: Secondary | ICD-10-CM

## 2018-03-07 DIAGNOSIS — M1990 Unspecified osteoarthritis, unspecified site: Secondary | ICD-10-CM | POA: Diagnosis present

## 2018-03-07 DIAGNOSIS — D500 Iron deficiency anemia secondary to blood loss (chronic): Secondary | ICD-10-CM

## 2018-03-07 DIAGNOSIS — K862 Cyst of pancreas: Secondary | ICD-10-CM | POA: Diagnosis not present

## 2018-03-07 DIAGNOSIS — Z86718 Personal history of other venous thrombosis and embolism: Secondary | ICD-10-CM | POA: Diagnosis not present

## 2018-03-07 DIAGNOSIS — M21372 Foot drop, left foot: Secondary | ICD-10-CM | POA: Diagnosis present

## 2018-03-07 DIAGNOSIS — E876 Hypokalemia: Secondary | ICD-10-CM | POA: Diagnosis not present

## 2018-03-07 DIAGNOSIS — M549 Dorsalgia, unspecified: Secondary | ICD-10-CM | POA: Diagnosis not present

## 2018-03-07 DIAGNOSIS — G4700 Insomnia, unspecified: Secondary | ICD-10-CM | POA: Diagnosis present

## 2018-03-07 DIAGNOSIS — K219 Gastro-esophageal reflux disease without esophagitis: Secondary | ICD-10-CM | POA: Diagnosis not present

## 2018-03-07 DIAGNOSIS — I5040 Unspecified combined systolic (congestive) and diastolic (congestive) heart failure: Secondary | ICD-10-CM | POA: Diagnosis not present

## 2018-03-07 DIAGNOSIS — I4892 Unspecified atrial flutter: Secondary | ICD-10-CM | POA: Diagnosis present

## 2018-03-07 DIAGNOSIS — I10 Essential (primary) hypertension: Secondary | ICD-10-CM | POA: Diagnosis not present

## 2018-03-07 DIAGNOSIS — I455 Other specified heart block: Secondary | ICD-10-CM | POA: Diagnosis not present

## 2018-03-07 DIAGNOSIS — Z881 Allergy status to other antibiotic agents status: Secondary | ICD-10-CM

## 2018-03-07 DIAGNOSIS — I5042 Chronic combined systolic (congestive) and diastolic (congestive) heart failure: Secondary | ICD-10-CM | POA: Diagnosis not present

## 2018-03-07 DIAGNOSIS — N401 Enlarged prostate with lower urinary tract symptoms: Secondary | ICD-10-CM | POA: Diagnosis present

## 2018-03-07 DIAGNOSIS — R350 Frequency of micturition: Secondary | ICD-10-CM | POA: Diagnosis present

## 2018-03-07 DIAGNOSIS — R932 Abnormal findings on diagnostic imaging of liver and biliary tract: Secondary | ICD-10-CM | POA: Diagnosis not present

## 2018-03-07 DIAGNOSIS — Z8249 Family history of ischemic heart disease and other diseases of the circulatory system: Secondary | ICD-10-CM

## 2018-03-07 DIAGNOSIS — I447 Left bundle-branch block, unspecified: Secondary | ICD-10-CM | POA: Diagnosis not present

## 2018-03-07 DIAGNOSIS — D62 Acute posthemorrhagic anemia: Secondary | ICD-10-CM | POA: Diagnosis present

## 2018-03-07 DIAGNOSIS — K2970 Gastritis, unspecified, without bleeding: Secondary | ICD-10-CM | POA: Diagnosis not present

## 2018-03-07 DIAGNOSIS — I4510 Unspecified right bundle-branch block: Secondary | ICD-10-CM | POA: Diagnosis present

## 2018-03-07 DIAGNOSIS — Z951 Presence of aortocoronary bypass graft: Secondary | ICD-10-CM | POA: Diagnosis not present

## 2018-03-07 DIAGNOSIS — N183 Chronic kidney disease, stage 3 unspecified: Secondary | ICD-10-CM | POA: Diagnosis present

## 2018-03-07 DIAGNOSIS — N289 Disorder of kidney and ureter, unspecified: Secondary | ICD-10-CM | POA: Diagnosis not present

## 2018-03-07 DIAGNOSIS — I480 Paroxysmal atrial fibrillation: Secondary | ICD-10-CM | POA: Diagnosis not present

## 2018-03-07 DIAGNOSIS — Z79890 Hormone replacement therapy (postmenopausal): Secondary | ICD-10-CM | POA: Diagnosis not present

## 2018-03-07 DIAGNOSIS — R58 Hemorrhage, not elsewhere classified: Secondary | ICD-10-CM | POA: Diagnosis not present

## 2018-03-07 DIAGNOSIS — Z885 Allergy status to narcotic agent status: Secondary | ICD-10-CM

## 2018-03-07 DIAGNOSIS — R195 Other fecal abnormalities: Secondary | ICD-10-CM | POA: Diagnosis not present

## 2018-03-07 DIAGNOSIS — I5022 Chronic systolic (congestive) heart failure: Secondary | ICD-10-CM | POA: Diagnosis not present

## 2018-03-07 DIAGNOSIS — R609 Edema, unspecified: Secondary | ICD-10-CM | POA: Diagnosis not present

## 2018-03-07 DIAGNOSIS — K869 Disease of pancreas, unspecified: Secondary | ICD-10-CM | POA: Diagnosis not present

## 2018-03-07 LAB — CBC WITH DIFFERENTIAL/PLATELET
ABS IMMATURE GRANULOCYTES: 0.03 10*3/uL (ref 0.00–0.07)
Basophils Absolute: 0 10*3/uL (ref 0.0–0.1)
Basophils Relative: 1 %
Eosinophils Absolute: 0.2 10*3/uL (ref 0.0–0.5)
Eosinophils Relative: 3 %
HEMATOCRIT: 29.1 % — AB (ref 39.0–52.0)
Hemoglobin: 8.8 g/dL — ABNORMAL LOW (ref 13.0–17.0)
IMMATURE GRANULOCYTES: 1 %
LYMPHS ABS: 0.9 10*3/uL (ref 0.7–4.0)
Lymphocytes Relative: 15 %
MCH: 28.2 pg (ref 26.0–34.0)
MCHC: 30.2 g/dL (ref 30.0–36.0)
MCV: 93.3 fL (ref 80.0–100.0)
MONO ABS: 0.6 10*3/uL (ref 0.1–1.0)
Monocytes Relative: 10 %
NEUTROS ABS: 4.5 10*3/uL (ref 1.7–7.7)
Neutrophils Relative %: 70 %
Platelets: 206 10*3/uL (ref 150–400)
RBC: 3.12 MIL/uL — ABNORMAL LOW (ref 4.22–5.81)
RDW: 17 % — ABNORMAL HIGH (ref 11.5–15.5)
WBC: 6.3 10*3/uL (ref 4.0–10.5)
nRBC: 0 % (ref 0.0–0.2)

## 2018-03-07 LAB — COMPREHENSIVE METABOLIC PANEL
ALK PHOS: 27 U/L — AB (ref 38–126)
ALT: 12 U/L (ref 0–44)
AST: 32 U/L (ref 15–41)
Albumin: 3 g/dL — ABNORMAL LOW (ref 3.5–5.0)
Anion gap: 9 (ref 5–15)
BILIRUBIN TOTAL: 0.6 mg/dL (ref 0.3–1.2)
BUN: 95 mg/dL — AB (ref 8–23)
CALCIUM: 8.7 mg/dL — AB (ref 8.9–10.3)
CO2: 30 mmol/L (ref 22–32)
CREATININE: 2.13 mg/dL — AB (ref 0.61–1.24)
Chloride: 95 mmol/L — ABNORMAL LOW (ref 98–111)
GFR calc Af Amer: 32 mL/min — ABNORMAL LOW (ref >60)
GFR calc non Af Amer: 28 mL/min — ABNORMAL LOW (ref >60)
Glucose, Bld: 191 mg/dL — ABNORMAL HIGH (ref 70–99)
Potassium: 4 mmol/L (ref 3.5–5.1)
Sodium: 134 mmol/L — ABNORMAL LOW (ref 135–145)
TOTAL PROTEIN: 6.2 g/dL — AB (ref 6.5–8.1)

## 2018-03-07 LAB — POC OCCULT BLOOD, ED: FECAL OCCULT BLD: POSITIVE — AB

## 2018-03-07 LAB — PROTIME-INR
INR: 1.79
Prothrombin Time: 20.7 seconds — ABNORMAL HIGH (ref 11.4–15.2)

## 2018-03-07 LAB — I-STAT TROPONIN, ED: Troponin i, poc: 0 ng/mL (ref 0.00–0.08)

## 2018-03-07 LAB — GLUCOSE, CAPILLARY: Glucose-Capillary: 187 mg/dL — ABNORMAL HIGH (ref 70–99)

## 2018-03-07 MED ORDER — SODIUM CHLORIDE 0.9 % IV SOLN
8.0000 mg/h | INTRAVENOUS | Status: DC
  Administered 2018-03-07: 8 mg/h via INTRAVENOUS
  Filled 2018-03-07 (×2): qty 80

## 2018-03-07 MED ORDER — SODIUM CHLORIDE 0.9 % IV SOLN
80.0000 mg | Freq: Once | INTRAVENOUS | Status: AC
  Administered 2018-03-07: 80 mg via INTRAVENOUS
  Filled 2018-03-07: qty 80

## 2018-03-07 MED ORDER — CARVEDILOL 12.5 MG PO TABS
25.0000 mg | ORAL_TABLET | Freq: Once | ORAL | Status: AC
  Administered 2018-03-07: 25 mg via ORAL
  Filled 2018-03-07: qty 2

## 2018-03-07 MED ORDER — PANTOPRAZOLE SODIUM 40 MG IV SOLR: 40.0000 mg | Freq: Two times a day (BID) | INTRAVENOUS | Status: DC

## 2018-03-07 NOTE — ED Notes (Signed)
Attempted report x1. RN with another pt

## 2018-03-07 NOTE — Telephone Encounter (Signed)
° °  Patient's spouse calling about blisters and swelling on legs and foot.   1) How much weight have you gained and in what time span? 2 lbs overnight  2) If swelling, where is the swelling located? Legs, ankles, feet, blisters   3) Are you currently taking a fluid pill? YES  4) Are you currently SOB? NO  5) Do you have a log of your daily weights (if so, list)? 242  6) Have you gained 3 pounds in a day or 5 pounds in a week?   7) Have you traveled recently? NO

## 2018-03-07 NOTE — Progress Notes (Signed)
Patient admitted to [facility] from ED. Patient alert and oriented x4. Plan of care explained to patient and wife. Admission assessment complete. CCMD notified. No other complaints at this time. Will continue to monitor patient.

## 2018-03-07 NOTE — ED Notes (Signed)
ED TO INPATIENT HANDOFF REPORT  Name/Age/Gender [name] 77 y.o. male  Code Status Code Status History    Date Active Date Inactive Code Status Order ID Comments User Context   02/25/2018 1721 03/03/2018 1621 Full Code [id number]  Elgergawy, Silver Huguenin, MD ED   11/04/2017 1754 12/03/2017 1414 Full Code [id number]  Dagoberto Ligas, PA-C Inpatient   10/25/2017 1600 11/02/2017 1432 Full Code [id number]  Dagoberto Ligas, PA-C Inpatient   10/11/2017 1646 10/11/2017 2348 Full Code [id number]  Waynetta Sandy, MD Inpatient   09/21/2011 0817 09/29/2011 1628 Full Code [id number]  [name], MD Inpatient   09/14/2011 1555 09/21/2011 0817 Full Code [id number]  Lemont Fillers, RN Inpatient   04/08/2011 1702 04/09/2011 1416 Full Code [id number]  Love, Kaylyn Layer, RN Inpatient      Home/SNF/Other Home  Chief Complaint GI bleed HR 120  Level of Care/Admitting Diagnosis ED Disposition    ED Disposition Condition [facility] Area: [place] [[id number]]  Level of Care: Telemetry [5]  Diagnosis: GI bleed [937902]  Admitting Physician: Shela Leff [[id number]]  Attending Physician: Shela Leff [[id number]]  Estimated length of stay: past midnight tomorrow  Certification:: I certify this patient will need inpatient services for at least 2 midnights  PT Class (Do Not Modify): Inpatient [101]  PT Acc Code (Do Not Modify): Private [1]       Medical History Past Medical History:  Diagnosis Date  . Arthritis   . Atherosclerotic heart disease   . Back pain   . Bruises easily    takes Pletal daily and ASA 372m daily  . Cataract immature    right  . Chest pain   . Colon polyps   . Complication of anesthesia    hard to wake up with bypass surgery  . Cyst    on perineum;takes Doxycycline daily  . Diabetes mellitus    takes Glipizide,Metformin,and Actos daily  . Diarrhea   . Dyspnea    when lying flat  . Edema    right leg knee down  swollen since fall 3 wks ago  . Foot drop, left   . GERD (gastroesophageal reflux disease)    Rolaids as needed-occ reflux  . Hemorrhoids   . History of kidney stones    at age 77  . Hyperlipidemia    takes Tricor and Lipitor daily  . Hypertension    takes Altace,Amlodipine,and Nadolol daily  . Hypothyroidism   . Kidney stone    35+yrs ago  . Muscle pain   . Nasal polyps    hx of  . Peripheral edema    wears knee length hose-told by podiatrist to wear these  . Peripheral neuropathy    takes Gabapentin daily  . Pneumonia    as a child  . P[place]ipheral vascular disease[place]   . Renal insufficiency   . Seasonal allergies    takes Mucinex and Zyrtec prn  . Urinary frequency    urgency/takes Vesicare daily    Allergies Allergies  Allergen Reactions  . Amoxicillin-Pot Clavulanate Nausea Only    Has patient had a PCN reaction causing immediate rash, facial/tongue/throat swelling, SOB or lightheadedness with hypotension: No Has patient had a PCN reaction causing severe rash involving mucus membranes or skin necrosis: No Has patient had a PCN reaction that required hospitalization: No Has patient had a PCN reaction occurring within the last 10 years: No If all of the above answers are "NO",  then may proceed with Cephalosporin use.   Marland Kitchen Morphine And Related Other (See Comments)    hallucinates    IV Location/Drains/Wounds Patient Lines/Drains/Airways Status   Active Line/Drains/Airways    Name:   Placement date:   Placement time:   Site:   Days:   Peripheral IV 03/07/18 Left Wrist   03/07/18    2057    Wrist   less than 1          Labs/Imaging Results for orders placed or performed during the hospital encounter of 03/07/18 (from the past 48 hour(s))  CBC with Differential     Status: Abnormal   Collection Time: 03/07/18  6:32 PM  Result Value Ref Range   WBC 6.3 4.0 - 10.5 K/uL   RBC 3.12 (L) 4.22 - 5.81 MIL/uL   Hemoglobin 8.8 (L) 13.0 - 17.0 g/dL   HCT 29.1  (L) 39.0 - 52.0 %   MCV 93.3 80.0 - 100.0 fL   MCH 28.2 26.0 - 34.0 pg   MCHC 30.2 30.0 - 36.0 g/dL   RDW 17.0 (H) 11.5 - 15.5 %   Platelets 206 150 - 400 K/uL   nRBC 0.0 0.0 - 0.2 %   Neutrophils Relative % 70 %   Neutro Abs 4.5 1.7 - 7.7 K/uL   Lymphocytes Relative 15 %   Lymphs Abs 0.9 0.7 - 4.0 K/uL   Monocytes Relative 10 %   Monocytes Absolute 0.6 0.1 - 1.0 K/uL   Eosinophils Relative 3 %   Eosinophils Absolute 0.2 0.0 - 0.5 K/uL   Basophils Relative 1 %   Basophils Absolute 0.0 0.0 - 0.1 K/uL   Immature Granulocytes 1 %   Abs Immature Granulocytes 0.03 0.00 - 0.07 K/uL    Comment: Performed at [facility], [address], [place], [place] [address]  Comprehensive metabolic panel     Status: Abnormal   Collection Time: 03/07/18  6:32 PM  Result Value Ref Range   Sodium 134 (L) 135 - 145 mmol/L   Potassium 4.0 3.5 - 5.1 mmol/L   Chloride 95 (L) 98 - 111 mmol/L   CO2 30 22 - 32 mmol/L   Glucose, Bld 191 (H) 70 - 99 mg/dL   BUN 95 (H) 8 - 23 mg/dL   Creatinine, Ser 2.13 (H) 0.61 - 1.24 mg/dL   Calcium 8.7 (L) 8.9 - 10.3 mg/dL   Total Protein 6.2 (L) 6.5 - 8.1 g/dL   Albumin 3.0 (L) 3.5 - 5.0 g/dL   AST 32 15 - 41 U/L   ALT 12 0 - 44 U/L   Alkaline Phosphatase 27 (L) 38 - 126 U/L   Total Bilirubin 0.6 0.3 - 1.2 mg/dL   GFR calc non Af Amer 28 (L) >60 mL/min   GFR calc Af Amer 32 (L) >60 mL/min    Comment: (NOTE) The eGFR has been calculated using the CKD EPI equation. This calculation has not been validated in all clinical situations. eGFR's persistently <60 mL/min signify possible Chronic Kidney Disease.    Anion gap 9 5 - 15    Comment: Performed at [facility] [address] [place], [place] [address]  Protime-INR     Status: Abnormal   Collection Time: 03/07/18  6:32 PM  Result Value Ref Range   Prothrombin Time 20.7 (H) 11.4 - 15.2 seconds   INR 1.79     Comment: Performed at [facility] [address], [place], [place] [address]  Type and screen [place]     Status: None   Collection Time: 03/07/18  6:34 PM  Result Value Ref Range   ABO/RH(D) A POS    Antibody Screen NEG    Sample Expiration      03/10/2018 Performed at [facility], [place] [address], [place], [place] [address]   I-stat troponin, ED     Status: None   Collection Time: 03/07/18  6:55 PM  Result Value Ref Range   Troponin i, poc 0.00 0.00 - 0.08 ng/mL   Comment 3            Comment: Due to the release kinetics of cTnI, a negative result within the first hours of the onset of symptoms does not rule out myocardial infarction with certainty. If myocardial infarction is still suspected, repeat the test at appropriate intervals.   POC occult blood, ED     Status: Abnormal   Collection Time: 03/07/18  8:05 PM  Result Value Ref Range   Fecal Occult Bld POSITIVE (A) NEGATIVE   No results found.  Pending Labs Unresulted Labs (From admission, onward)    Start     Ordered   03/07/18 1746  Occult blood card to lab, stool  Once,   STAT     03/07/18 1746          Vitals/Pain Today's Vitals   03/07/18 1721 03/07/18 1739 03/07/18 1849 03/07/18 2101  BP:   (!) 114/48 (!) 118/56  Pulse:   (!) 114 (!) 117  Resp:   14   SpO2:   98%   PainSc: 0-No pain 0-No pain      Isolation Precautions No active isolations  Medications Medications  pantoprazole (PROTONIX) 80 mg in sodium chloride 0.9 % 250 mL (0.32 mg/mL) infusion (8 mg/hr Intravenous New Bag/Given 03/07/18 2057)  pantoprazole (PROTONIX) injection 40 mg (has no administration in time range)  carvedilol (COREG) tablet 25 mg (25 mg Oral Given 03/07/18 2101)  pantoprazole (PROTONIX) 80 mg in sodium chloride 0.9 % 100 mL IVPB (80 mg Intravenous New Bag/Given 03/07/18 2058)    Mobility walks with device

## 2018-03-07 NOTE — ED Triage Notes (Signed)
Pt arrives via EMS from PCP for suspected GI bleed. Pt recently hospitalized for CHF exacerbation and dx with AFIB. Pt started on eliquis. Since starting eliquis pt has noted dark tarry stool. Pt remains in afib rate 120's. BP 134/78, RR 16, 98% RA. Pt with no complaints on arrival. 20g L wrist.

## 2018-03-07 NOTE — Telephone Encounter (Signed)
Note ER data. Appears to have GI bleed.

## 2018-03-07 NOTE — Telephone Encounter (Signed)
Patient's wife calling complaining of patient having LLE swelling with sores, a blister on his left leg, and patient has gained 2 lbs since being home. Patient's wife stated that the swelling is not as bad as it was in the hospital, but it has gotten worse since leaving the hospital. Patient has sores on his left leg that are red but not open, patient's wife stated these are from swelling. Patient also has a nickel sized blister on the back of his left leg near his ankle. Tried to encouraged patient's wife to call his PCP. Patient has an appointment with our office next week for follow up form hospital. Patient has appointment this week with Dr. Oneida Alar on Thursday, his vascular surgeon also. Patient's wife stated she wants to hear from Dr. Tamala Julian. Will forward to Dr. Tamala Julian for advisement.

## 2018-03-07 NOTE — ED Provider Notes (Signed)
[facility] EMERGENCY DEPARTMENT Provider Note   CSN: [id number] Arrival date & time: 03/07/18  1731     History   Chief Complaint Chief Complaint  Patient presents with  . Atrial Fibrillation    HPI [name] is a 77 y.o. male.  HPI   Presents with concern for melena, one episode daily for last 3 days. No hematemesis, no abdominal pain.   June 3rd had fem-pop bypass, had infection for greater than one month then rehab, then back to hospital 10/4 for hypervolemia, diuresed Today was having OT and mentioned black stool and was told to come in   For the last 3 days he has had intermittent dark black tarry stool, no bright red blood per rectum, no other bleeding, hematuria, hematemesis, no abdominal pain or reflux. No n/v/fever/chills. Has felt more fatigued, weaker. Also endorses intermittent nonexertional chest tightness. Baseline orthopnea is improved since recent admission.   On eliquis  Past Medical History:  Diagnosis Date  . Arthritis   . Atherosclerotic heart disease   . Back pain   . Bruises easily    takes Pletal daily and ASA 325mg  daily  . Cataract immature    right  . Chest pain   . Colon polyps   . Complication of anesthesia    hard to wake up with bypass surgery  . Cyst    on perineum;takes Doxycycline daily  . Diabetes mellitus    takes Glipizide,Metformin,and Actos daily  . Diarrhea   . Dyspnea    when lying flat  . Edema    right leg knee down swollen since fall 3 wks ago  . Foot drop, left   . GERD (gastroesophageal reflux disease)    Rolaids as needed-occ reflux  . Hemorrhoids   . History of kidney stones    at age 30   . Hyperlipidemia    takes Tricor and Lipitor daily  . Hypertension    takes Altace,Amlodipine,and Nadolol daily  . Hypothyroidism   . Kidney stone    35+yrs ago  . Muscle pain   . Nasal polyps    hx of  . Peripheral edema    wears knee length hose-told by podiatrist to wear these  .  Peripheral neuropathy    takes Gabapentin daily  . Pneumonia    as a child  . PVD (peripheral vascular disease) ([place])   . Renal insufficiency   . Seasonal allergies    takes Mucinex and Zyrtec prn  . Urinary frequency    urgency/takes Vesicare daily    Patient Active Problem List   Diagnosis Date Noted  . GI bleed 03/07/2018  . Coronary artery disease involving coronary bypass graft of native heart without angina pectoris   . Benign essential HTN   . PAD (peripheral artery disease) ([place])   . New onset atrial fibrillation ([place])   . Tachypnea   . Hyponatremia   . Malnutrition of moderate degree 02/27/2018  . CHF (congestive heart failure) ([place]) 02/25/2018  . Volume overload 11/10/2017  . Surgical wound infection 11/04/2017  . Polyneuropathy associated with underlying disease ([place]) 08/16/2017  . Essential hypertension 02/06/2014  . Acute on chronic combined systolic and diastolic CHF (congestive heart failure) ([place]) 02/06/2014  . Chronic kidney disease, stage III (moderate) ([place]) 02/06/2014  . Aftercare following surgery of the circulatory system, [place] 01/25/2014  . Carotid stenosis 06/08/2013  . Stricture of artery ([place]) 04/14/2012  . Peripheral vascular disease, unspecified ([place]) 10/08/2011  . DVT  of upper extremity (deep vein thrombosis), left 09/29/2011  . S/P CABG (coronary artery bypass graft) 09/29/2011  . H/O carotid endarterectomy 09/29/2011  . Occlusion and stenosis of carotid artery without mention of cerebral infarction 06/11/2011  . Diabetes mellitus type 2 with complications ([place]) 54/00/8676    Past Surgical History:  Procedure Laterality Date  . Aortobifemoral bypass    . APPLICATION OF WOUND VAC Left 10/25/2017   Procedure: Wound vac placement to left groin.;  Surgeon: Elam Dutch, MD;  Location: [facility] OR;  Service: Vascular;  Laterality: Left;  . APPLICATION OF WOUND VAC Left 11/10/2017   Procedure: APPLICATION OF WOUND VAC;  Surgeon: Elam Dutch, MD;   Location: [facility];  Service: Vascular;  Laterality: Left;  . CARDIAC CATHETERIZATION  most recent 05/2011   total of 4  . CAROTID ANGIOGRAM N/A 06/05/2011   Procedure: CAROTID ANGIOGRAM;  Surgeon: Elam Dutch, MD;  Location: [facility] CATH LAB;  Service: Cardiovascular;  Laterality: N/A;  . CAROTID ENDARTERECTOMY  04/08/2011   left CEA  . cataract surgery Bilateral    left  . COLONOSCOPY    . COLONOSCOPY WITH PROPOFOL N/A 08/22/2013   Procedure: COLONOSCOPY WITH PROPOFOL;  Surgeon: Garlan Fair, MD;  Location: [facility] ENDOSCOPY;  Service: Endoscopy;  Laterality: N/A;  . CORONARY ARTERY BYPASS GRAFT  09/09/2011   Procedure: CORONARY ARTERY BYPASS GRAFTING (CABG);  Surgeon: Gaye Pollack, MD;  Location: [facility];  Service: Open Heart Surgery;  Laterality: N/A;  Coronary artery bypass grafting  x three with Right saphenous vein harvested endoscopically and left internal mammary artery  . ENDARTERECTOMY  04/08/2011   Procedure: ENDARTERECTOMY CAROTID;  Surgeon: Elam Dutch, MD;  Location[facility] OR;  Service: Vascular;  Laterality: Left;  with patch angioplasty  . ENDARTERECTOMY  09/09/2011   Procedure: ENDARTERECTOMY CAROTID;  Surgeon: Elam Dutch, MD;  Location: [facility] OR;  Service: Vascular;  Laterality: Right;  with patch angioplasty  . FEMORAL-POPLITEAL BYPASS GRAFT Left 10/25/2017   Procedure: Left Common Femoral Endarterectomy and perfundaplasty, Left BYPASS GRAFT FEMORAL-BELOW KNEE POPLITEAL ARTERY.;  Surgeon: Elam Dutch, MD;  Location: [facility];  Service: Vascular;  Laterality: Left;  . GROIN DEBRIDEMENT Left 11/10/2017   Procedure: GROIN DEBRIDEMENT;  Surgeon: Elam Dutch, MD;  Location: [facility];  Service: Vascular;  Laterality: Left;  . LEFT HEART CATHETERIZATION WITH CORONARY ANGIOGRAM N/A 07/09/2011   Procedure: LEFT HEART CATHETERIZATION WITH CORONARY ANGIOGRAM;  Surgeon: Sinclair Grooms, MD;  Location: [facility] CATH LAB;  Service: Cardiovascular;  Laterality: N/A;  . LOWER EXTREMITY  ANGIOGRAPHY N/A 10/11/2017   Procedure: LOWER EXTREMITY ANGIOGRAPHY;  Surgeon: Waynetta [place], MD;  Location: [facility] CV LAB;  Service: Cardiovascular;  Laterality: N/A;  . Reimplantation of inferior mesenteric artery    . Repair of infrarenal abdominal aortic aneurysm    . SHOULDER ARTHROSCOPY W/ ROTATOR CUFF REPAIR     right and left-open procedures  . TONSILLECTOMY     at age 45  . TRIGGER FINGER RELEASE Right 06/28/2014   Procedure: RIGHT LONG TRIGGER RELEASE ;  Surgeon: Leanora Cover, MD;  Location: [facility];  Service: Orthopedics;  Laterality: Right;  . wisdom teeth extracted     as a teenager  . WOUND DEBRIDEMENT Left 11/05/2017   Procedure: DEBRIDEMENT WOUND LEFT GROIN with wound vac placement;  Surgeon: Elam Dutch, MD;  Location: [facility];  Service: Vascular;  Laterality: Left;        Home  Medications    Prior to Admission medications   Medication Sig Start Date End Date Taking? Authorizing Provider  acetaminophen (TYLENOL) 500 MG tablet Take 1,000 mg by mouth every 6 (six) hours as needed for headache (pain).   Yes [provider]  apixaban (ELIQUIS) 5 MG TABS tablet Take 1 tablet (5 mg total) by mouth 2 (two) times daily. 03/03/18 04/02/18 Yes Arrien, Jimmy Picket, MD  Ascorbic Acid (VITAMIN C PO) Take 1 tablet by mouth 2 (two) times daily.   Yes [provider]  aspirin EC 81 MG tablet Take 1 tablet (81 mg total) by mouth daily. 02/03/18  Yes Belva Crome, MD  atorvastatin (LIPITOR) 20 MG tablet Take 20 mg by mouth at bedtime.    Yes [provider]  carvedilol (COREG) 25 MG tablet Take 1 tablet (25 mg total) by mouth 2 (two) times daily. 03/03/18 04/02/18 Yes Arrien, Jimmy Picket, MD  fenofibrate 54 MG tablet Take 1 tablet (54 mg total) by mouth daily. 03/03/18 04/02/18 Yes Arrien, Jimmy Picket, MD  gabapentin (NEURONTIN) 300 MG capsule Take 300 mg by mouth 2 (two) times daily.   Yes [provider]    levothyroxine (SYNTHROID, LEVOTHROID) 100 MCG tablet Take 100 mcg by mouth daily before breakfast.  01/07/18  Yes [provider]  methylcellulose (ARTIFICIAL TEARS) 1 % ophthalmic solution Place 1 drop into both eyes 2 (two) times daily as needed (dry eyes).   Yes [provider]  metolazone (ZAROXOLYN) 5 MG tablet Take 5 mg by mouth See admin instructions. Take one tablet (5 mg) by mouth when directed to do so by cardiologist.   Yes [provider]  Multiple Vitamin (MULTIVITAMIN WITH MINERALS) TABS tablet Take 1 tablet by mouth daily. 12/03/17  Yes Rhyne, Samantha J, PA-C  nitroGLYCERIN (NITROSTAT) 0.4 MG SL tablet PLACE 1 TABLET UNDER TONGUE EVERY 5 MINUTES AS NEEDED FOR CHEST PAIN. Patient taking differently: Place 0.4 mg under the tongue every 5 (five) minutes as needed for chest pain.  09/17/15  Yes Belva Crome, MD  oxymetazoline (VICKS SINEX) 0.05 % nasal spray Place 1 spray into both nostrils 2 (two) times daily as needed for congestion.   Yes [provider]  pioglitazone (ACTOS) 30 MG tablet Take 1 tablet (30 mg total) by mouth daily with lunch. 12/03/17  Yes Rhyne, Hulen Shouts, PA-C  pseudoephedrine-guaifenesin (MUCINEX D) 60-600 MG per tablet Take 1 tablet by mouth every 12 (twelve) hours as needed for congestion.    Yes [provider]  sodium chloride (OCEAN) 0.65 % SOLN nasal spray Place 1 spray into both nostrils 2 (two) times daily as needed for congestion.   Yes [provider]  tamsulosin (FLOMAX) 0.4 MG CAPS capsule Take 0.4 mg by mouth every other day.   Yes [provider]  temazepam (RESTORIL) 15 MG capsule Take 15 mg by mouth at bedtime as needed for sleep.    Yes [provider]  torsemide (DEMADEX) 20 MG tablet Take 3 tablets (60 mg total) by mouth 2 (two) times daily. 02/03/18  Yes Belva Crome, MD  vitamin B-12 (CYANOCOBALAMIN) 500 MCG tablet Take 500 mcg by mouth daily.   Yes [provider]   zinc gluconate 50 MG tablet Take 50 mg by mouth daily.   Yes [provider]    Family History Family History  Problem Relation Age of Onset  . Heart disease Mother   . Heart disease Father   . Anesthesia problems  Neg Hx   . Hypotension Neg Hx   . Malignant hyperthermia Neg Hx   . Pseudochol deficiency Neg Hx     Social History Social History   Tobacco Use  . Smoking status: Former Smoker    Years: 40.00    Types: Cigarettes    Last attempt to quit: 10/27/2005    Years since quitting: 12.3  . Smokeless tobacco: Never Used  Substance Use Topics  . Alcohol use: Yes    Alcohol/week: 2.0 - 3.0 standard drinks    Types: 2 - 3 Cans of beer per week    Comment: occasional- weekly  . Drug use: No     Allergies   Amoxicillin-pot clavulanate and Morphine and related   Review of Systems Review of Systems  Constitutional: Positive for fatigue. Negative for fever.  HENT: Negative for sore throat.   Eyes: Negative for visual disturbance.  Respiratory: Negative for shortness of breath.   Cardiovascular: Negative for chest pain.  Gastrointestinal: Positive for blood in stool (dark stool). Negative for abdominal pain, nausea and vomiting.  Genitourinary: Negative for difficulty urinating.  Musculoskeletal: Negative for back pain and neck stiffness.  Skin: Negative for rash.  Neurological: Negative for syncope and headaches.     Physical Exam Updated Vital Signs BP (!) 111/57 (BP Location: Left Arm)   Pulse (!) 116   Temp 98 F (36.7 C)   Resp (!) 24   Ht 5\' 9"  (1.753 m)   Wt 113.6 kg   SpO2 92%   BMI 36.98 kg/m   Physical Exam  Constitutional: He is oriented to person, place, and time. He appears well-developed and well-nourished. No distress.  HENT:  Head: Normocephalic and atraumatic.  Eyes: Conjunctivae and EOM are normal.  Neck: Normal range of motion.  Cardiovascular: Normal heart sounds and intact distal pulses. An irregularly irregular rhythm  present. Tachycardia present. Exam reveals no gallop and no friction rub.  No murmur heard. Pulmonary/Chest: Effort normal and breath sounds normal. No respiratory distress. He has no wheezes. He has no rales.  Abdominal: Soft. He exhibits no distension. There is no tenderness. There is no guarding.  Genitourinary:  Genitourinary Comments: melena  Musculoskeletal: He exhibits no edema.  Neurological: He is alert and oriented to person, place, and time.  Skin: Skin is warm and dry. He is not diaphoretic.  Nursing note and vitals reviewed.    ED Treatments / Results  Labs (all labs ordered are listed, but only abnormal results are displayed) Labs Reviewed  CBC WITH DIFFERENTIAL/PLATELET - Abnormal; Notable for the following components:      Result Value   RBC 3.12 (*)    Hemoglobin 8.8 (*)    HCT 29.1 (*)    RDW 17.0 (*)    All other components within normal limits  COMPREHENSIVE METABOLIC PANEL - Abnormal; Notable for the following components:   Sodium 134 (*)    Chloride 95 (*)    Glucose, Bld 191 (*)    BUN 95 (*)    Creatinine, Ser 2.13 (*)    Calcium 8.7 (*)    Total Protein 6.2 (*)    Albumin 3.0 (*)    Alkaline Phosphatase 27 (*)    GFR calc non Af Amer 28 (*)    GFR calc Af Amer 32 (*)    All other components within normal limits  PROTIME-INR - Abnormal; Notable for the following components:   Prothrombin Time 20.7 (*)    All other components within normal  limits  GLUCOSE, CAPILLARY - Abnormal; Notable for the following components:   Glucose-Capillary 187 (*)    All other components within normal limits  POC OCCULT BLOOD, ED - Abnormal; Notable for the following components:   Fecal Occult Bld POSITIVE (*)    All other components within normal limits  OCCULT BLOOD X 1 CARD TO LAB, STOOL  I-STAT TROPONIN, ED  TYPE AND SCREEN    EKG None  Radiology No results found.  Procedures Procedures (including critical care time)  Medications Ordered in  ED Medications  pantoprazole (PROTONIX) 80 mg in sodium chloride 0.9 % 250 mL (0.32 mg/mL) infusion (8 mg/hr Intravenous New Bag/Given 03/07/18 2057)  pantoprazole (PROTONIX) injection 40 mg (has no administration in time range)  carvedilol (COREG) tablet 25 mg (25 mg Oral Given 03/07/18 2101)  pantoprazole (PROTONIX) 80 mg in sodium chloride 0.9 % 100 mL IVPB (0 mg Intravenous Stopped 03/07/18 2145)     Initial Impression / Assessment and Plan / ED Course  I have reviewed the triage vital signs and the nursing notes.  Pertinent labs & imaging results that were available during my care of the patient were reviewed by me and considered in my medical decision making (see chart for details).     77 year old male with complicated medical history as listed above, on Eliquis, presents with concern for melena for the last 3 days.  Patient normotensive in the emergency department, with mild tachycardia with atrial fibrillation.  Exam shows melena.  Hemoglobin as change from 10-8.8.  Suspect upper GI bleed, possible peptic ulcer disease.  Patient was given bolus and infusion of Protonix.  Discussed with Dr. Cristina Gong of [facility] gastroenterology who will evaluate the patient in the morning and less he has any other changes.  Discussed with hospitalist will admit patient for further care.  Final Clinical Impressions(s) / ED Diagnoses   Final diagnoses:  Atrial fibrillation, unspecified type ([place])  Upper GI bleed  Anemia, blood loss    ED Discharge Orders    None       Gareth Morgan, MD 03/08/18 [phone]

## 2018-03-07 NOTE — Progress Notes (Signed)
Patient sat 85% on room air. Placed on 2L Nasal canula. O2 Sat 100%. Will continue to monitor patient.

## 2018-03-07 NOTE — ED Notes (Signed)
Attempted report x2. Will do bedside report.

## 2018-03-08 ENCOUNTER — Inpatient Hospital Stay (HOSPITAL_COMMUNITY): Payer: Medicare HMO

## 2018-03-08 DIAGNOSIS — I5040 Unspecified combined systolic (congestive) and diastolic (congestive) heart failure: Secondary | ICD-10-CM

## 2018-03-08 DIAGNOSIS — D649 Anemia, unspecified: Secondary | ICD-10-CM

## 2018-03-08 DIAGNOSIS — M549 Dorsalgia, unspecified: Secondary | ICD-10-CM

## 2018-03-08 DIAGNOSIS — E785 Hyperlipidemia, unspecified: Secondary | ICD-10-CM

## 2018-03-08 DIAGNOSIS — I4891 Unspecified atrial fibrillation: Secondary | ICD-10-CM

## 2018-03-08 DIAGNOSIS — G8929 Other chronic pain: Secondary | ICD-10-CM

## 2018-03-08 DIAGNOSIS — K921 Melena: Principal | ICD-10-CM

## 2018-03-08 LAB — GLUCOSE, CAPILLARY
GLUCOSE-CAPILLARY: 127 mg/dL — AB (ref 70–99)
Glucose-Capillary: 165 mg/dL — ABNORMAL HIGH (ref 70–99)
Glucose-Capillary: 121 mg/dL — ABNORMAL HIGH (ref 70–99)
Glucose-Capillary: 192 mg/dL — ABNORMAL HIGH (ref 70–99)

## 2018-03-08 LAB — BASIC METABOLIC PANEL
ANION GAP: 8 (ref 5–15)
BUN: 85 mg/dL — AB (ref 8–23)
CO2: 29 mmol/L (ref 22–32)
Calcium: 8.7 mg/dL — ABNORMAL LOW (ref 8.9–10.3)
Chloride: 98 mmol/L (ref 98–111)
Creatinine, Ser: 1.96 mg/dL — ABNORMAL HIGH (ref 0.61–1.24)
GFR calc Af Amer: 36 mL/min — ABNORMAL LOW (ref >60)
GFR calc non Af Amer: 31 mL/min — ABNORMAL LOW (ref >60)
Glucose, Bld: 181 mg/dL — ABNORMAL HIGH (ref 70–99)
POTASSIUM: 3.4 mmol/L — AB (ref 3.5–5.1)
SODIUM: 135 mmol/L (ref 135–145)

## 2018-03-08 LAB — CBC
HCT: 31.1 % — ABNORMAL LOW (ref 39.0–52.0)
Hemoglobin: 9.6 g/dL — ABNORMAL LOW (ref 13.0–17.0)
MCH: 28.2 pg (ref 26.0–34.0)
MCHC: 30.9 g/dL (ref 30.0–36.0)
MCV: 91.5 fL (ref 80.0–100.0)
PLATELETS: 220 10*3/uL (ref 150–400)
RBC: 3.4 MIL/uL — ABNORMAL LOW (ref 4.22–5.81)
RDW: 17.6 % — AB (ref 11.5–15.5)
WBC: 5.5 10*3/uL (ref 4.0–10.5)
nRBC: 0 % (ref 0.0–0.2)

## 2018-03-08 LAB — PREPARE RBC (CROSSMATCH)

## 2018-03-08 MED ORDER — GUAIFENESIN ER 600 MG PO TB12: 600.0000 mg | ORAL_TABLET | Freq: Two times a day (BID) | ORAL | Status: DC | PRN

## 2018-03-08 MED ORDER — ADULT MULTIVITAMIN W/MINERALS CH
1.0000 | ORAL_TABLET | Freq: Every day | ORAL | Status: DC
  Administered 2018-03-08 – 2018-03-11 (×4): 1 via ORAL
  Filled 2018-03-08 (×4): qty 1

## 2018-03-08 MED ORDER — POLYVINYL ALCOHOL 1.4 % OP SOLN
1.0000 [drp] | Freq: Two times a day (BID) | OPHTHALMIC | Status: DC | PRN
  Filled 2018-03-08: qty 15

## 2018-03-08 MED ORDER — OXYCODONE-ACETAMINOPHEN 5-325 MG PO TABS
1.0000 | ORAL_TABLET | Freq: Four times a day (QID) | ORAL | Status: DC | PRN
  Administered 2018-03-08 – 2018-03-09 (×3): 1 via ORAL
  Filled 2018-03-08 (×3): qty 1

## 2018-03-08 MED ORDER — LEVOTHYROXINE SODIUM 100 MCG PO TABS
100.0000 ug | ORAL_TABLET | Freq: Every day | ORAL | Status: DC
  Administered 2018-03-08 – 2018-03-11 (×4): 100 ug via ORAL
  Filled 2018-03-08 (×4): qty 1

## 2018-03-08 MED ORDER — PANTOPRAZOLE SODIUM 40 MG IV SOLR
40.0000 mg | Freq: Two times a day (BID) | INTRAVENOUS | Status: DC
  Administered 2018-03-08 – 2018-03-11 (×7): 40 mg via INTRAVENOUS
  Filled 2018-03-08 (×7): qty 40

## 2018-03-08 MED ORDER — FENOFIBRATE 54 MG PO TABS
54.0000 mg | ORAL_TABLET | Freq: Every day | ORAL | Status: DC
  Administered 2018-03-09 – 2018-03-11 (×3): 54 mg via ORAL
  Filled 2018-03-08 (×4): qty 1

## 2018-03-08 MED ORDER — ZINC SULFATE 220 (50 ZN) MG PO CAPS
220.0000 mg | ORAL_CAPSULE | Freq: Every day | ORAL | Status: DC
  Administered 2018-03-08 – 2018-03-11 (×4): 220 mg via ORAL
  Filled 2018-03-08 (×4): qty 1

## 2018-03-08 MED ORDER — ACETAMINOPHEN 500 MG PO TABS
1000.0000 mg | ORAL_TABLET | Freq: Four times a day (QID) | ORAL | Status: DC | PRN
  Administered 2018-03-08 – 2018-03-11 (×4): 1000 mg via ORAL
  Filled 2018-03-08 (×4): qty 2

## 2018-03-08 MED ORDER — INSULIN ASPART 100 UNIT/ML ~~LOC~~ SOLN
0.0000 [IU] | Freq: Three times a day (TID) | SUBCUTANEOUS | Status: DC
  Administered 2018-03-09: 3 [IU] via SUBCUTANEOUS
  Administered 2018-03-10 – 2018-03-11 (×3): 2 [IU] via SUBCUTANEOUS
  Administered 2018-03-11: 1 [IU] via SUBCUTANEOUS

## 2018-03-08 MED ORDER — ATORVASTATIN CALCIUM 20 MG PO TABS
20.0000 mg | ORAL_TABLET | Freq: Every day | ORAL | Status: DC
  Administered 2018-03-08 – 2018-03-10 (×4): 20 mg via ORAL
  Filled 2018-03-08 (×4): qty 1

## 2018-03-08 MED ORDER — OXYMETAZOLINE HCL 0.05 % NA SOLN
1.0000 | Freq: Two times a day (BID) | NASAL | Status: DC | PRN
  Filled 2018-03-08: qty 15

## 2018-03-08 MED ORDER — PSEUDOEPHEDRINE-GUAIFENESIN ER 60-600 MG PO TB12: 1.0000 | ORAL_TABLET | Freq: Two times a day (BID) | ORAL | Status: DC | PRN

## 2018-03-08 MED ORDER — SODIUM CHLORIDE 0.9 % IV SOLN
INTRAVENOUS | Status: DC
  Administered 2018-03-08: 05:00:00 via INTRAVENOUS

## 2018-03-08 MED ORDER — GABAPENTIN 300 MG PO CAPS
300.0000 mg | ORAL_CAPSULE | Freq: Two times a day (BID) | ORAL | Status: DC
  Administered 2018-03-08 – 2018-03-11 (×7): 300 mg via ORAL
  Filled 2018-03-08 (×7): qty 1

## 2018-03-08 MED ORDER — TEMAZEPAM 15 MG PO CAPS
15.0000 mg | ORAL_CAPSULE | Freq: Every evening | ORAL | Status: DC | PRN
  Administered 2018-03-08: 15 mg via ORAL
  Filled 2018-03-08 (×2): qty 1

## 2018-03-08 MED ORDER — PSEUDOEPHEDRINE HCL 60 MG PO TABS
60.0000 mg | ORAL_TABLET | Freq: Two times a day (BID) | ORAL | Status: DC | PRN
  Filled 2018-03-08: qty 1

## 2018-03-08 MED ORDER — SALINE SPRAY 0.65 % NA SOLN
1.0000 | Freq: Two times a day (BID) | NASAL | Status: DC | PRN
  Filled 2018-03-08: qty 44

## 2018-03-08 MED ORDER — VITAMIN C 500 MG PO TABS
250.0000 mg | ORAL_TABLET | Freq: Two times a day (BID) | ORAL | Status: DC
  Administered 2018-03-08 – 2018-03-11 (×6): 250 mg via ORAL
  Filled 2018-03-08 (×6): qty 1

## 2018-03-08 MED ORDER — VITAMIN B-12 1000 MCG PO TABS
500.0000 ug | ORAL_TABLET | Freq: Every day | ORAL | Status: DC
  Administered 2018-03-08 – 2018-03-11 (×4): 500 ug via ORAL
  Filled 2018-03-08 (×4): qty 1

## 2018-03-08 MED ORDER — POTASSIUM CHLORIDE CRYS ER 20 MEQ PO TBCR
40.0000 meq | EXTENDED_RELEASE_TABLET | Freq: Once | ORAL | Status: AC
  Administered 2018-03-08: 40 meq via ORAL
  Filled 2018-03-08: qty 2

## 2018-03-08 MED ORDER — CARVEDILOL 25 MG PO TABS
25.0000 mg | ORAL_TABLET | Freq: Two times a day (BID) | ORAL | Status: DC
  Administered 2018-03-09 – 2018-03-11 (×5): 25 mg via ORAL
  Filled 2018-03-08 (×6): qty 1

## 2018-03-08 MED ORDER — SODIUM CHLORIDE 0.9% IV SOLUTION
Freq: Once | INTRAVENOUS | Status: AC
  Administered 2018-03-08: 02:00:00 via INTRAVENOUS

## 2018-03-08 NOTE — Consult Note (Signed)
Referring Provider:  [facility] Primary Care Physician:  Lavone Orn, MD Primary Gastroenterologist: None (former patient of Dr. Howell Rucks)  Reason for Consultation: Melena  HPI: [name] is a 77 y.o. male admitted through the emergency room yesterday evening with a 3 to 4-day history of dark stools, and a hemoglobin of 8.8, as compared to a level of 10.5 just a week earlier.  The patient is on aspirin (81 mg, until about a month ago it was 325 mg) and Eliquis twice daily (last dose of Eliquis yesterday morning).  The patient has also been having nagging, moderate pain in the center of his back.  This is unusual for him.  He states he has had a previous aortic aneurysm repair and has "a zipper all the way down" although I do not see that noted on chart review, just a mention of aortobifemoral bypass.  He has not had any dyspeptic symptomatology.  In the hospital, he has received 1 unit of packed cells overnight with an appropriate rise in hemoglobin.  His blood pressure has been in the normal range.  The patient has chronic renal insufficiency without any major deviation from his baseline values, estimated GFR today is 31, BUN 85, creatinine 1.96.   Past Medical History:  Diagnosis Date  . Arthritis   . Atherosclerotic heart disease   . Back pain   . Bruises easily    takes Pletal daily and ASA 325mg  daily  . Cataract immature    right  . Chest pain   . Colon polyps   . Complication of anesthesia    hard to wake up with bypass surgery  . Cyst    on perineum;takes Doxycycline daily  . Diabetes mellitus    takes Glipizide,Metformin,and Actos daily  . Diarrhea   . Dyspnea    when lying flat  . Edema    right leg knee down swollen since fall 3 wks ago  . Foot drop, left   . GERD (gastroesophageal reflux disease)    Rolaids as needed-occ reflux  . Hemorrhoids   . History of kidney stones    at age 44   . Hyperlipidemia    takes Tricor and  Lipitor daily  . Hypertension    takes Altace,Amlodipine,and Nadolol daily  . Hypothyroidism   . Kidney stone    35+yrs ago  . Muscle pain   . Nasal polyps    hx of  . Peripheral edema    wears knee length hose-told by podiatrist to wear these  . Peripheral neuropathy    takes Gabapentin daily  . Pneumonia    as a child  . PVD (peripheral vascular disease) ([place])   . Renal insufficiency   . Seasonal allergies    takes Mucinex and Zyrtec prn  . Urinary frequency    urgency/takes Vesicare daily    Past Surgical History:  Procedure Laterality Date  . Aortobifemoral bypass    . APPLICATION OF WOUND VAC Left 10/25/2017   Procedure: Wound vac placement to left groin.;  Surgeon: Elam Dutch, MD;  Location: [facility] OR;  Service: Vascular;  Laterality: Left;  . APPLICATION OF WOUND VAC Left 11/10/2017   Procedure: APPLICATION OF WOUND VAC;  Surgeon: Elam Dutch, MD;  Location: [place];  Service: Vascular;  Laterality: Left;  . CARDIAC CATHETERIZATION  most recent 05/2011   total of 4  . CAROTID ANGIOGRAM N/A 06/05/2011   Procedure: CAROTID ANGIOGRAM;  Surgeon: Elam Dutch, MD;  Location: [place] CATH LAB;  Service: Cardiovascular;  Laterality: N/A;  . CAROTID ENDARTERECTOMY  04/08/2011   left CEA  . cataract surgery Bilateral    left  . COLONOSCOPY    . COLONOSCOPY WITH PROPOFOL N/A 08/22/2013   Procedure: COLONOSCOPY WITH PROPOFOL;  Surgeon: Garlan Fair, MD;  Location: [facility] ENDOSCOPY;  Service: Endoscopy;  Laterality: N/A;  . CORONARY ARTERY BYPASS GRAFT  09/09/2011   Procedure: CORONARY ARTERY BYPASS GRAFTING (CABG);  Surgeon: Gaye Pollack, MD;  Location: [place];  Service: Open Heart Surgery;  Laterality: N/A;  Coronary artery bypass grafting  x three with Right saphenous vein harvested endoscopically and left internal mammary artery  . ENDARTERECTOMY  04/08/2011   Procedure: ENDARTERECTOMY CAROTID;  Surgeon: Elam Dutch, MD;  Location: [facility] OR;  Service: Vascular;   Laterality: Left;  with patch angioplasty  . ENDARTERECTOMY  09/09/2011   Procedure: ENDARTERECTOMY CAROTID;  Surgeon: Elam Dutch, MD;  Location: [facility] OR;  Service: Vascular;  Laterality: Right;  with patch angioplasty  . FEMORAL-POPLITEAL BYPASS GRAFT Left 10/25/2017   Procedure: Left Common Femoral Endarterectomy and perfundaplasty, Left BYPASS GRAFT FEMORAL-BELOW KNEE POPLITEAL ARTERY.;  Surgeon: Elam Dutch, MD;  Location: [place];  Service: Vascular;  Laterality: Left;  . GROIN DEBRIDEMENT Left 11/10/2017   Procedure: GROIN DEBRIDEMENT;  Surgeon: Elam Dutch, MD;  Location: [place];  Service: Vascular;  Laterality: Left;  . LEFT HEART CATHETERIZATION WITH CORONARY ANGIOGRAM N/A 07/09/2011   Procedure: LEFT HEART CATHETERIZATION WITH CORONARY ANGIOGRAM;  Surgeon: Sinclair Grooms, MD;  Location: [facility] CATH LAB;  Service: Cardiovascular;  Laterality: N/A;  . LOWER EXTREMITY ANGIOGRAPHY N/A 10/11/2017   Procedure: LOWER EXTREMITY ANGIOGRAPHY;  Surgeon: Waynetta Sandy, MD;  Location: [place] CV LAB;  Service: Cardiovascular;  Laterality: N/A;  . Reimplantation of inferior mesenteric artery    . Repair of infrarenal abdominal aortic aneurysm    . SHOULDER ARTHROSCOPY W/ ROTATOR CUFF REPAIR     right and left-open procedures  . TONSILLECTOMY     at age 20  . TRIGGER FINGER RELEASE Right 06/28/2014   Procedure: RIGHT LONG TRIGGER RELEASE ;  Surgeon: Leanora Cover, MD;  Location: [place];  Service: Orthopedics;  Laterality: Right;  . wisdom teeth extracted     as a teenager  . WOUND DEBRIDEMENT Left 11/05/2017   Procedure: DEBRIDEMENT WOUND LEFT GROIN with wound vac placement;  Surgeon: Elam Dutch, MD;  Location: [facility] OR;  Service: Vascular;  Laterality: Left;    Prior to Admission medications   Medication Sig Start Date End Date Taking? Authorizing Provider  acetaminophen (TYLENOL) 500 MG tablet Take 1,000 mg by mouth every 6 (six) hours as needed for  headache (pain).   Yes [provider]  apixaban (ELIQUIS) 5 MG TABS tablet Take 1 tablet (5 mg total) by mouth 2 (two) times daily. 03/03/18 04/02/18 Yes Arrien, Jimmy Picket, MD  Ascorbic Acid (VITAMIN C PO) Take 1 tablet by mouth 2 (two) times daily.   Yes [provider]  aspirin EC 81 MG tablet Take 1 tablet (81 mg total) by mouth daily. 02/03/18  Yes Belva Crome, MD  atorvastatin (LIPITOR) 20 MG tablet Take 20 mg by mouth at bedtime.    Yes [provider]  carvedilol (COREG) 25 MG tablet Take 1 tablet (25 mg total) by mouth 2 (two) times daily. 03/03/18 04/02/18 Yes Arrien, Jimmy Picket, MD  fenofibrate 54 MG tablet Take 1 tablet (  54 mg total) by mouth daily. 03/03/18 04/02/18 Yes Arrien, Jimmy Picket, MD  gabapentin (NEURONTIN) 300 MG capsule Take 300 mg by mouth 2 (two) times daily.   Yes [provider]  levothyroxine (SYNTHROID, LEVOTHROID) 100 MCG tablet Take 100 mcg by mouth daily before breakfast.  01/07/18  Yes [provider]  methylcellulose (ARTIFICIAL TEARS) 1 % ophthalmic solution Place 1 drop into both eyes 2 (two) times daily as needed (dry eyes).   Yes [provider]  metolazone (ZAROXOLYN) 5 MG tablet Take 5 mg by mouth See admin instructions. Take one tablet (5 mg) by mouth when directed to do so by cardiologist.   Yes [provider]  Multiple Vitamin (MULTIVITAMIN WITH MINERALS) TABS tablet Take 1 tablet by mouth daily. 12/03/17  Yes Rhyne, Samantha J, PA-C  nitroGLYCERIN (NITROSTAT) 0.4 MG SL tablet PLACE 1 TABLET UNDER TONGUE EVERY 5 MINUTES AS NEEDED FOR CHEST PAIN. Patient taking differently: Place 0.4 mg under the tongue every 5 (five) minutes as needed for chest pain.  09/17/15  Yes Belva Crome, MD  oxymetazoline (VICKS SINEX) 0.05 % nasal spray Place 1 spray into both nostrils 2 (two) times daily as needed for congestion.   Yes [provider]  pioglitazone (ACTOS) 30 MG tablet Take 1  tablet (30 mg total) by mouth daily with lunch. 12/03/17  Yes Rhyne, Hulen Shouts, PA-C  pseudoephedrine-guaifenesin (MUCINEX D) 60-600 MG per tablet Take 1 tablet by mouth every 12 (twelve) hours as needed for congestion.    Yes [provider]  sodium chloride (OCEAN) 0.65 % SOLN nasal spray Place 1 spray into both nostrils 2 (two) times daily as needed for congestion.   Yes [provider]  tamsulosin (FLOMAX) 0.4 MG CAPS capsule Take 0.4 mg by mouth every other day.   Yes [provider]  temazepam (RESTORIL) 15 MG capsule Take 15 mg by mouth at bedtime as needed for sleep.    Yes [provider]  torsemide (DEMADEX) 20 MG tablet Take 3 tablets (60 mg total) by mouth 2 (two) times daily. 02/03/18  Yes Belva Crome, MD  vitamin B-12 (CYANOCOBALAMIN) 500 MCG tablet Take 500 mcg by mouth daily.   Yes [provider]  zinc gluconate 50 MG tablet Take 50 mg by mouth daily.   Yes [provider]    Current Facility-Administered Medications  Medication Dose Route Frequency Provider Last Rate Last Dose  . 0.9 %  sodium chloride infusion   Intravenous Continuous Shela Leff, MD 75 mL/hr at 03/08/18 [phone]    . acetaminophen (TYLENOL) tablet 1,000 mg  1,000 mg Oral Q6H PRN Shela Leff, MD      . atorvastatin (LIPITOR) tablet 20 mg  20 mg Oral QHS Shela Leff, MD   20 mg at 03/08/18 0305  . [START ON 03/09/2018] carvedilol (COREG) tablet 25 mg  25 mg Oral BID WC Shela Leff, MD      . fenofibrate tablet 54 mg  54 mg Oral Daily Shela Leff, MD      . gabapentin (NEURONTIN) capsule 300 mg  300 mg Oral BID Shela Leff, MD      . pseudoephedrine (SUDAFED) tablet 60 mg  60 mg Oral Q12H PRN Shela Leff, MD       And  . guaiFENesin (MUCINEX) 12 hr tablet 600 mg  600 mg Oral Q12H PRN Shela Leff, MD      . insulin aspart (novoLOG) injection 0-9 Units  0-9 Units Subcutaneous TID  WC Shela Leff, MD       . levothyroxine (SYNTHROID, LEVOTHROID) tablet 100 mcg  100 mcg Oral QAC breakfast Shela Leff, MD   100 mcg at 03/08/18 0520  . multivitamin with minerals tablet 1 tablet  1 tablet Oral Daily Shela Leff, MD      . oxymetazoline (AFRIN) 0.05 % nasal spray 1 spray  1 spray Each Nare BID PRN Shela Leff, MD      . pantoprazole (PROTONIX) injection 40 mg  40 mg Intravenous Q12H Shela Leff, MD      . polyvinyl alcohol (LIQUIFILM TEARS) 1.4 % ophthalmic solution 1 drop  1 drop Both Eyes BID PRN Shela Leff, MD      . sodium chloride (OCEAN) 0.65 % nasal spray 1 spray  1 spray Each Nare BID PRN Shela Leff, MD      . temazepam (RESTORIL) capsule 15 mg  15 mg Oral QHS PRN Shela Leff, MD      . vitamin B-12 (CYANOCOBALAMIN) tablet 500 mcg  500 mcg Oral Daily Shela Leff, MD      . vitamin C (ASCORBIC ACID) tablet 250 mg  250 mg Oral BID Shela Leff, MD      . zinc sulfate capsule 220 mg  220 mg Oral Daily Shela Leff, MD        Allergies as of 03/07/2018 - Review Complete 03/07/2018  Allergen Reaction Noted  . Amoxicillin-pot clavulanate Nausea Only 12/22/2010  . Morphine and related Other (See Comments) 10/21/2017    Family History  Problem Relation Age of Onset  . Heart disease Mother   . Heart disease Father   . Anesthesia problems Neg Hx   . Hypotension Neg Hx   . Malignant hyperthermia Neg Hx   . Pseudochol deficiency Neg Hx     Social History   Socioeconomic History  . Marital status: Married    Spouse name: Not on file  . Number of children: 2  . Years of education: Not on file  . Highest education level: Professional school degree (e.g., MD, DDS, DVM, JD)  Occupational History  . Occupation: retired Chief Executive Officer  Social Needs  . Financial resource strain: Not on file  . Food insecurity:    Worry: Not on file    Inability: Not on file  . Transportation needs:    Medical: Not on file    Non-medical: Not  on file  Tobacco Use  . Smoking status: Former Smoker    Years: 40.00    Types: Cigarettes    Last attempt to quit: 10/27/2005    Years since quitting: 12.3  . Smokeless tobacco: Never Used  Substance and Sexual Activity  . Alcohol use: Yes    Alcohol/week: 2.0 - 3.0 standard drinks    Types: 2 - 3 Cans of beer per week    Comment: occasional- weekly  . Drug use: No  . Sexual activity: Never  Lifestyle  . Physical activity:    Days per week: Not on file    Minutes per session: Not on file  . Stress: Not on file  Relationships  . Social connections:    Talks on phone: Patient refused    Gets together: Patient refused    Attends religious service: Patient refused    Active member of club or organization: Patient refused    Attends meetings of clubs or organizations: Patient refused    Relationship status: Patient refused  . Intimate partner violence:    Fear of current or  ex partner: Patient refused    Emotionally abused: Patient refused    Physically abused: Patient refused    Forced sexual activity: Patient refused  Other Topics Concern  . Not on file  Social History Narrative   He lives with wife in a 1 story home, three steps to entire home.  2 children.  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      Review of Systems: Pertinent for pain in the center of his back without dyspeptic symptoms.  Physical Exam: Vital signs in last 24 hours: Temp:  [97.7 F (36.5 C)-98 F (36.7 C)] 97.7 F (36.5 C) (10/15 0526) Pulse Rate:  [71-117] 93 (10/15 0700) Resp:  [14-24] 16 (10/15 0526) BP: (111-135)/(47-73) 120/73 (10/15 0526) SpO2:  [92 %-100 %] 100 % (10/15 0526) Weight:  [113.6 kg] 113.6 kg (10/15 0410) Last BM Date: 03/07/18  This is a stocky but not severely obese Caucasian male, very pale, sitting up in the bed side chair, alert and appropriate, no evident distress.  He is anicteric.  The chest is clear.  The heart has an irregular rhythm consistent with his known atrial  fibrillation.  The abdomen has no audible bruit, guarding, or tenderness, no pulsatile mass.  Mood is normal, no overt focal neurologic deficits.   Intake/Output from previous day: 10/14 0701 - 10/15 0700 In: 2673.1 [I.V.:2260.9; Blood:315; IV Piggyback:97.2] Out: 500 [Urine:500] Intake/Output this shift: No intake/output data recorded.  Lab Results: Recent Labs    03/07/18 1832 03/08/18 0759  WBC 6.3 5.5  HGB 8.8* 9.6*  HCT 29.1* 31.1*  PLT 206 220   BMET Recent Labs    03/07/18 1832 03/08/18 0759  NA 134* 135  K 4.0 3.4*  CL 95* 98  CO2 30 29  GLUCOSE 191* 181*  BUN 95* 85*  CREATININE 2.13* 1.96*  CALCIUM 8.7* 8.7*   LFT Recent Labs    03/07/18 1832  PROT 6.2*  ALBUMIN 3.0*  AST 32  ALT 12  ALKPHOS 27*  BILITOT 0.6   PT/INR Recent Labs    03/07/18 1832  LABPROT 20.7*  INR 1.79    Studies/Results: No results found.  Impression: GI bleeding most compatible with upper GI bleeding, which I think most likely is due to aspirin induced gastropathy with bleeding accentuated by chronic anticoagulation.   Background history of some sort of vascular procedure, possibly AAA repair, raising question of aortoenteric fistula, especially given pain in the center of his back which he does not usually have.  Posthemorrhagic anemia, responsive to transfusion.  Chronic renal insufficiency.  Plan: Since the patient is clinically stable, our hand is not forced to do emergent evaluation.  Therefore, I would like to hold off on IV contrast and begin his evaluation with a noncontrast CT of the abdomen and pelvis to see if we can exclude (or, less likely, confirmed) and aortoenteric fistula or evidence of leakage from the vascular tree.  If that study is negative, the patient would probably be a candidate for endoscopy later today or tomorrow, the nature, purpose, and risks of which were reviewed with the patient and his wife, and he is agreeable.   If essential, this  patient could probably tolerate IV contrast, per discussion with the radiologist.  He is right on the borderline of renal function for receiving IV contrast.  I have discussed his case with Dr. Paulita Fujita, who is our main hospital doctor this week.   LOS: 1 day   [name]  03/08/2018, 9:58  AM   Pager [phone] If no answer or after 5 PM call [phone]

## 2018-03-08 NOTE — H&P (View-Only) (Signed)
Referring Provider:  [facility] Primary Care Physician:  Lavone Orn, MD Primary Gastroenterologist: None (former patient of Dr. Howell Rucks)  Reason for Consultation: Melena  HPI: [name] is a 77 y.o. male admitted through the emergency room yesterday evening with a 3 to 4-day history of dark stools, and a hemoglobin of 8.8, as compared to a level of 10.5 just a week earlier.  The patient is on aspirin (81 mg, until about a month ago it was 325 mg) and Eliquis twice daily (last dose of Eliquis yesterday morning).  The patient has also been having nagging, moderate pain in the center of his back.  This is unusual for him.  He states he has had a previous aortic aneurysm repair and has "a zipper all the way down" although I do not see that noted on chart review, just a mention of aortobifemoral bypass.  He has not had any dyspeptic symptomatology.  In the hospital, he has received 1 unit of packed cells overnight with an appropriate rise in hemoglobin.  His blood pressure has been in the normal range.  The patient has chronic renal insufficiency without any major deviation from his baseline values, estimated GFR today is 31, BUN 85, creatinine 1.96.   Past Medical History:  Diagnosis Date  . Arthritis   . Atherosclerotic heart disease   . Back pain   . Bruises easily    takes Pletal daily and ASA 325mg  daily  . Cataract immature    right  . Chest pain   . Colon polyps   . Complication of anesthesia    hard to wake up with bypass surgery  . Cyst    on perineum;takes Doxycycline daily  . Diabetes mellitus    takes Glipizide,Metformin,and Actos daily  . Diarrhea   . Dyspnea    when lying flat  . Edema    right leg knee down swollen since fall 3 wks ago  . Foot drop, left   . GERD (gastroesophageal reflux disease)    Rolaids as needed-occ reflux  . Hemorrhoids   . History of kidney stones    at age 65   . Hyperlipidemia    takes Tricor and  Lipitor daily  . Hypertension    takes Altace,Amlodipine,and Nadolol daily  . Hypothyroidism   . Kidney stone    35+yrs ago  . Muscle pain   . Nasal polyps    hx of  . Peripheral edema    wears knee length hose-told by podiatrist to wear these  . Peripheral neuropathy    takes Gabapentin daily  . Pneumonia    as a child  . PVD (peripheral vascular disease) ([place])   . Renal insufficiency   . Seasonal allergies    takes Mucinex and Zyrtec prn  . Urinary frequency    urgency/takes Vesicare daily    Past Surgical History:  Procedure Laterality Date  . Aortobifemoral bypass    . APPLICATION OF WOUND VAC Left 10/25/2017   Procedure: Wound vac placement to left groin.;  Surgeon: Elam Dutch, MD;  Location: [facility] OR;  Service: Vascular;  Laterality: Left;  . APPLICATION OF WOUND VAC Left 11/10/2017   Procedure: APPLICATION OF WOUND VAC;  Surgeon: Elam Dutch, MD;  Location: [place];  Service: Vascular;  Laterality: Left;  . CARDIAC CATHETERIZATION  most recent 05/2011   total of 4  . CAROTID ANGIOGRAM N/A 06/05/2011   Procedure: CAROTID ANGIOGRAM;  Surgeon: Elam Dutch, MD;  Location: [place] CATH LAB;  Service: Cardiovascular;  Laterality: N/A;  . CAROTID ENDARTERECTOMY  04/08/2011   left CEA  . cataract surgery Bilateral    left  . COLONOSCOPY    . COLONOSCOPY WITH PROPOFOL N/A 08/22/2013   Procedure: COLONOSCOPY WITH PROPOFOL;  Surgeon: Garlan Fair, MD;  Location: [facility] ENDOSCOPY;  Service: Endoscopy;  Laterality: N/A;  . CORONARY ARTERY BYPASS GRAFT  09/09/2011   Procedure: CORONARY ARTERY BYPASS GRAFTING (CABG);  Surgeon: Gaye Pollack, MD;  Location: [place];  Service: Open Heart Surgery;  Laterality: N/A;  Coronary artery bypass grafting  x three with Right saphenous vein harvested endoscopically and left internal mammary artery  . ENDARTERECTOMY  04/08/2011   Procedure: ENDARTERECTOMY CAROTID;  Surgeon: Elam Dutch, MD;  Location: [facility] OR;  Service: Vascular;   Laterality: Left;  with patch angioplasty  . ENDARTERECTOMY  09/09/2011   Procedure: ENDARTERECTOMY CAROTID;  Surgeon: Elam Dutch, MD;  Location: [facility] OR;  Service: Vascular;  Laterality: Right;  with patch angioplasty  . FEMORAL-POPLITEAL BYPASS GRAFT Left 10/25/2017   Procedure: Left Common Femoral Endarterectomy and perfundaplasty, Left BYPASS GRAFT FEMORAL-BELOW KNEE POPLITEAL ARTERY.;  Surgeon: Elam Dutch, MD;  Location: [place];  Service: Vascular;  Laterality: Left;  . GROIN DEBRIDEMENT Left 11/10/2017   Procedure: GROIN DEBRIDEMENT;  Surgeon: Elam Dutch, MD;  Location: [place];  Service: Vascular;  Laterality: Left;  . LEFT HEART CATHETERIZATION WITH CORONARY ANGIOGRAM N/A 07/09/2011   Procedure: LEFT HEART CATHETERIZATION WITH CORONARY ANGIOGRAM;  Surgeon: Sinclair Grooms, MD;  Location: [facility] CATH LAB;  Service: Cardiovascular;  Laterality: N/A;  . LOWER EXTREMITY ANGIOGRAPHY N/A 10/11/2017   Procedure: LOWER EXTREMITY ANGIOGRAPHY;  Surgeon: Waynetta Sandy, MD;  Location: [place] CV LAB;  Service: Cardiovascular;  Laterality: N/A;  . Reimplantation of inferior mesenteric artery    . Repair of infrarenal abdominal aortic aneurysm    . SHOULDER ARTHROSCOPY W/ ROTATOR CUFF REPAIR     right and left-open procedures  . TONSILLECTOMY     at age 59  . TRIGGER FINGER RELEASE Right 06/28/2014   Procedure: RIGHT LONG TRIGGER RELEASE ;  Surgeon: Leanora Cover, MD;  Location: [place];  Service: Orthopedics;  Laterality: Right;  . wisdom teeth extracted     as a teenager  . WOUND DEBRIDEMENT Left 11/05/2017   Procedure: DEBRIDEMENT WOUND LEFT GROIN with wound vac placement;  Surgeon: Elam Dutch, MD;  [facility]: [facility] OR;  Service: Vascular;  Laterality: Left;    Prior to Admission medications   Medication Sig Start Date End Date Taking? Authorizing Provider  acetaminophen (TYLENOL) 500 MG tablet Take 1,000 mg by mouth every 6 (six) hours as needed for  headache (pain).   Yes [provider]  apixaban (ELIQUIS) 5 MG TABS tablet Take 1 tablet (5 mg total) by mouth 2 (two) times daily. 03/03/18 04/02/18 Yes Arrien, Jimmy Picket, MD  Ascorbic Acid (VITAMIN C PO) Take 1 tablet by mouth 2 (two) times daily.   Yes [provider]  aspirin EC 81 MG tablet Take 1 tablet (81 mg total) by mouth daily. 02/03/18  Yes Belva Crome, MD  atorvastatin (LIPITOR) 20 MG tablet Take 20 mg by mouth at bedtime.    Yes [provider]  carvedilol (COREG) 25 MG tablet Take 1 tablet (25 mg total) by mouth 2 (two) times daily. 03/03/18 04/02/18 Yes Arrien, Jimmy Picket, MD  fenofibrate 54 MG tablet Take 1 tablet (  54 mg total) by mouth daily. 03/03/18 04/02/18 Yes Arrien, Jimmy Picket, MD  gabapentin (NEURONTIN) 300 MG capsule Take 300 mg by mouth 2 (two) times daily.   Yes [provider]  levothyroxine (SYNTHROID, LEVOTHROID) 100 MCG tablet Take 100 mcg by mouth daily before breakfast.  01/07/18  Yes [provider]  methylcellulose (ARTIFICIAL TEARS) 1 % ophthalmic solution Place 1 drop into both eyes 2 (two) times daily as needed (dry eyes).   Yes [provider]  metolazone (ZAROXOLYN) 5 MG tablet Take 5 mg by mouth See admin instructions. Take one tablet (5 mg) by mouth when directed to do so by cardiologist.   Yes [provider]  Multiple Vitamin (MULTIVITAMIN WITH MINERALS) TABS tablet Take 1 tablet by mouth daily. 12/03/17  Yes Rhyne, Samantha J, PA-C  nitroGLYCERIN (NITROSTAT) 0.4 MG SL tablet PLACE 1 TABLET UNDER TONGUE EVERY 5 MINUTES AS NEEDED FOR CHEST PAIN. Patient taking differently: Place 0.4 mg under the tongue every 5 (five) minutes as needed for chest pain.  09/17/15  Yes Belva Crome, MD  oxymetazoline (VICKS SINEX) 0.05 % nasal spray Place 1 spray into both nostrils 2 (two) times daily as needed for congestion.   Yes [provider]  pioglitazone (ACTOS) 30 MG tablet Take 1  tablet (30 mg total) by mouth daily with lunch. 12/03/17  Yes Rhyne, Hulen Shouts, PA-C  pseudoephedrine-guaifenesin (MUCINEX D) 60-600 MG per tablet Take 1 tablet by mouth every 12 (twelve) hours as needed for congestion.    Yes [provider]  sodium chloride (OCEAN) 0.65 % SOLN nasal spray Place 1 spray into both nostrils 2 (two) times daily as needed for congestion.   Yes [provider]  tamsulosin (FLOMAX) 0.4 MG CAPS capsule Take 0.4 mg by mouth every other day.   Yes [provider]  temazepam (RESTORIL) 15 MG capsule Take 15 mg by mouth at bedtime as needed for sleep.    Yes [provider]  torsemide (DEMADEX) 20 MG tablet Take 3 tablets (60 mg total) by mouth 2 (two) times daily. 02/03/18  Yes Belva Crome, MD  vitamin B-12 (CYANOCOBALAMIN) 500 MCG tablet Take 500 mcg by mouth daily.   Yes [provider]  zinc gluconate 50 MG tablet Take 50 mg by mouth daily.   Yes [provider]    Current Facility-Administered Medications  Medication Dose Route Frequency Provider Last Rate Last Dose  . 0.9 %  sodium chloride infusion   Intravenous Continuous Shela Leff, MD 75 mL/hr at 03/08/18 [phone]    . acetaminophen (TYLENOL) tablet 1,000 mg  1,000 mg Oral Q6H PRN Shela Leff, MD      . atorvastatin (LIPITOR) tablet 20 mg  20 mg Oral QHS Shela Leff, MD   20 mg at 03/08/18 0305  . [START ON 03/09/2018] carvedilol (COREG) tablet 25 mg  25 mg Oral BID WC Shela Leff, MD      . fenofibrate tablet 54 mg  54 mg Oral Daily Shela Leff, MD      . gabapentin (NEURONTIN) capsule 300 mg  300 mg Oral BID Shela Leff, MD      . pseudoephedrine (SUDAFED) tablet 60 mg  60 mg Oral Q12H PRN Shela Leff, MD       And  . guaiFENesin (MUCINEX) 12 hr tablet 600 mg  600 mg Oral Q12H PRN Shela Leff, MD      . insulin aspart (novoLOG) injection 0-9 Units  0-9 Units Subcutaneous TID  WC Shela Leff, MD       . levothyroxine (SYNTHROID, LEVOTHROID) tablet 100 mcg  100 mcg Oral QAC breakfast Shela Leff, MD   100 mcg at 03/08/18 0520  . multivitamin with minerals tablet 1 tablet  1 tablet Oral Daily Shela Leff, MD      . oxymetazoline (AFRIN) 0.05 % nasal spray 1 spray  1 spray Each Nare BID PRN Shela Leff, MD      . pantoprazole (PROTONIX) injection 40 mg  40 mg Intravenous Q12H Shela Leff, MD      . polyvinyl alcohol (LIQUIFILM TEARS) 1.4 % ophthalmic solution 1 drop  1 drop Both Eyes BID PRN Shela Leff, MD      . sodium chloride (OCEAN) 0.65 % nasal spray 1 spray  1 spray Each Nare BID PRN Shela Leff, MD      . temazepam (RESTORIL) capsule 15 mg  15 mg Oral QHS PRN Shela Leff, MD      . vitamin B-12 (CYANOCOBALAMIN) tablet 500 mcg  500 mcg Oral Daily Shela Leff, MD      . vitamin C (ASCORBIC ACID) tablet 250 mg  250 mg Oral BID Shela Leff, MD      . zinc sulfate capsule 220 mg  220 mg Oral Daily Shela Leff, MD        Allergies as of 03/07/2018 - Review Complete 03/07/2018  Allergen Reaction Noted  . Amoxicillin-pot clavulanate Nausea Only 12/22/2010  . Morphine and related Other (See Comments) 10/21/2017    Family History  Problem Relation Age of Onset  . Heart disease Mother   . Heart disease Father   . Anesthesia problems Neg Hx   . Hypotension Neg Hx   . Malignant hyperthermia Neg Hx   . Pseudochol deficiency Neg Hx     Social History   Socioeconomic History  . Marital status: Married    Spouse name: Not on file  . Number of children: 2  . Years of education: Not on file  . Highest education level: Professional school degree (e.g., MD, DDS, DVM, JD)  Occupational History  . Occupation: retired Chief Executive Officer  Social Needs  . Financial resource strain: Not on file  . Food insecurity:    Worry: Not on file    Inability: Not on file  . Transportation needs:    Medical: Not on file    Non-medical: Not  on file  Tobacco Use  . Smoking status: Former Smoker    Years: 40.00    Types: Cigarettes    Last attempt to quit: 10/27/2005    Years since quitting: 12.3  . Smokeless tobacco: Never Used  Substance and Sexual Activity  . Alcohol use: Yes    Alcohol/week: 2.0 - 3.0 standard drinks    Types: 2 - 3 Cans of beer per week    Comment: occasional- weekly  . Drug use: No  . Sexual activity: Never  Lifestyle  . Physical activity:    Days per week: Not on file    Minutes per session: Not on file  . Stress: Not on file  Relationships  . Social connections:    Talks on phone: Patient refused    Gets together: Patient refused    Attends religious service: Patient refused    Active member of club or organization: Patient refused    Attends meetings of clubs or organizations: Patient refused    Relationship status: Patient refused  . Intimate partner violence:    Fear of current or  ex partner: Patient refused    Emotionally abused: Patient refused    Physically abused: Patient refused    Forced sexual activity: Patient refused  Other Topics Concern  . Not on file  Social History Narrative   He lives with wife in a 1 story home, three steps to entire home.  2 children.  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      Review of Systems: Pertinent for pain in the center of his back without dyspeptic symptoms.  Physical Exam: Vital signs in last 24 hours: Temp:  [97.7 F (36.5 C)-98 F (36.7 C)] 97.7 F (36.5 C) (10/15 0526) Pulse Rate:  [71-117] 93 (10/15 0700) Resp:  [14-24] 16 (10/15 0526) BP: (111-135)/(47-73) 120/73 (10/15 0526) SpO2:  [92 %-100 %] 100 % (10/15 0526) Weight:  [113.6 kg] 113.6 kg (10/15 0410) Last BM Date: 03/07/18  This is a stocky but not severely obese Caucasian male, very pale, sitting up in the bed side chair, alert and appropriate, no evident distress.  He is anicteric.  The chest is clear.  The heart has an irregular rhythm consistent with his known atrial  fibrillation.  The abdomen has no audible bruit, guarding, or tenderness, no pulsatile mass.  Mood is normal, no overt focal neurologic deficits.   Intake/Output from previous day: 10/14 0701 - 10/15 0700 In: 2673.1 [I.V.:2260.9; Blood:315; IV Piggyback:97.2] Out: 500 [Urine:500] Intake/Output this shift: No intake/output data recorded.  Lab Results: Recent Labs    03/07/18 1832 03/08/18 0759  WBC 6.3 5.5  HGB 8.8* 9.6*  HCT 29.1* 31.1*  PLT 206 220   BMET Recent Labs    03/07/18 1832 03/08/18 0759  NA 134* 135  K 4.0 3.4*  CL 95* 98  CO2 30 29  GLUCOSE 191* 181*  BUN 95* 85*  CREATININE 2.13* 1.96*  CALCIUM 8.7* 8.7*   LFT Recent Labs    03/07/18 1832  PROT 6.2*  ALBUMIN 3.0*  AST 32  ALT 12  ALKPHOS 27*  BILITOT 0.6   PT/INR Recent Labs    03/07/18 1832  LABPROT 20.7*  INR 1.79    Studies/Results: No results found.  Impression: GI bleeding most compatible with upper GI bleeding, which I think most likely is due to aspirin induced gastropathy with bleeding accentuated by chronic anticoagulation.   Background history of some sort of vascular procedure, possibly AAA repair, raising question of aortoenteric fistula, especially given pain in the center of his back which he does not usually have.  Posthemorrhagic anemia, responsive to transfusion.  Chronic renal insufficiency.  Plan: Since the patient is clinically stable, our hand is not forced to do emergent evaluation.  Therefore, I would like to hold off on IV contrast and begin his evaluation with a noncontrast CT of the abdomen and pelvis to see if we can exclude (or, less likely, confirmed) and aortoenteric fistula or evidence of leakage from the vascular tree.  If that study is negative, the patient would probably be a candidate for endoscopy later today or tomorrow, the nature, purpose, and risks of which were reviewed with the patient and his wife, and he is agreeable.   If essential, this  patient could probably tolerate IV contrast, per discussion with the radiologist.  He is right on the borderline of renal function for receiving IV contrast.  I have discussed his case with Dr. Paulita Fujita, who is our main hospital doctor this week.   LOS: 1 day   Youlanda Mighty Dynisha Due  03/08/2018, 9:58  AM   Pager [phone] If no answer or after 5 PM call [phone]

## 2018-03-08 NOTE — Progress Notes (Signed)
GI Dr Cristina Gong came to RN to discuss possible aortic enteric fistula, went to assess pt as no pain was reported, he says it goes from a 5-7 mid bach just achey and been about 3 days, he reports he did not tell the ED MD, vss, ra 98%, pt advised of CT order to check his aorta to make sure not cause of back pain, instructed still NPO and may still get EGD if CT neg, wife at bedside, pt assisted back to bed with walker, pt to CT in stable condition via bed

## 2018-03-08 NOTE — Progress Notes (Signed)
Paged Dr Cristina Gong to advise CT stat was completed

## 2018-03-08 NOTE — Progress Notes (Addendum)
Same day note  Patient was seen and examined at bedside.  Patient was admitted this morning.  Patient was admitted for GI bleeding with dark stools and a hemoglobin of 8.8 on presentation. Patient was on aspirin and Eliquis.  Received 1 unit of packed RBC with appropriate rise in hemoglobin at the latest hemoglobin of 9.6.  Does have history of chronic kidney disease and is currently at baseline.  Does have orthopnea and back pain.  Physical examination with basal crackles.  Pallor noted.  Bilateral lower extremity edema, chronic venous stasis with blisters.  Awake oriented x3.    Assessment and plan.   Upper GI bleeding.  Seen by GI.  Could be secondary to aspirin induced gastropathy with chronic anticoagulation.  He does have a history of triple AAA repair.  GI has recommended CT scan of the abdomen and pelvis to assess for aorta.  If this is negative patient will undergo endoscopy. Currently NPO.  Hold eliquis  And aspirin   Hypokalemia will replenish.  AKI and chronic kidney disease.  Received Gentle IV fluid hydration.  Will discontinue.  Check BMP in a.m.  History of congestive heart failure with bilateral lower extremity edema and blisters.  Continue on Coreg Lipitor.  Spoke with the patients family at bedside.   No charge.

## 2018-03-08 NOTE — H&P (Addendum)
History and Physical    MALCOME AMBROCIO KKX:[id number] DOB: 1940/10/10 DOA: 03/07/2018  PCP: Lavone Orn, MD Patient coming from: Home  Chief Complaint: Dark, sticky stools  HPI: SHANN MERRICK is a 77 y.o. male with medical history significant of type 2 diabetes, GERD, hemorrhoids, HTN, HLD, hypothyroidism presenting to the hospital with a 3-day history of daily dark, sticky stools.  Patient states he has been taking Eliquis for atrial fibrillation since his hospital discharge in October 2019.  His PCP was concerned about his dark stools and advised him to come into the ED for further evaluation.  He denies having any hematemesis or hematochezia.  Reports having dyspnea on exertion and generalized weakness for the past few days.  Denies prior history of GI bleed.  States he has had a colonoscopy before but he does not remember how long ago.  He denies having any chest pain or abdominal pain.  No other complaints.  ED Course: Tachycardic on arrival.  Blood pressure 114/48.  Hemoglobin 8.8; was 10.5 nine days ago.  INR 1.7.  I-STAT troponin negative.  FOBT positive.  EKG showing atrial flutter with heart rate 95. Also showing right bundle branch blockand QTC prolongation (544), similar to prior EKG. Patient received Coreg 25 mg p.o. and IV Protonix 80 mg once followed by continuous Protonix infusion in the ED.  ED provider informed me that they spoke to Dr. Cristina Gong from [facility] who recommended keeping the patient n.p.o. GI will see the patient in the morning.  TRH paged to admit.  Review of Systems: As per HPI otherwise 10 point review of systems negative.  Past Medical History:  Diagnosis Date  . Arthritis   . Atherosclerotic heart disease   . Back pain   . Bruises easily    takes Pletal daily and ASA 325mg  daily  . Cataract immature    right  . Chest pain   . Colon polyps   . Complication of anesthesia    hard to wake up with bypass surgery  . Cyst    on perineum;takes  Doxycycline daily  . Diabetes mellitus    takes Glipizide,Metformin,and Actos daily  . Diarrhea   . Dyspnea    when lying flat  . Edema    right leg knee down swollen since fall 3 wks ago  . Foot drop, left   . GERD (gastroesophageal reflux disease)    Rolaids as needed-occ reflux  . Hemorrhoids   . History of kidney stones    at age 78   . Hyperlipidemia    takes Tricor and Lipitor daily  . Hypertension    takes Altace,Amlodipine,and Nadolol daily  . Hypothyroidism   . Kidney stone    35+yrs ago  . Muscle pain   . Nasal polyps    hx of  . Peripheral edema    wears knee length hose-told by podiatrist to wear these  . Peripheral neuropathy    takes Gabapentin daily  . Pneumonia    as a child  . PVD (peripheral vascular disease) ([place])   . Renal insufficiency   . Seasonal allergies    takes Mucinex and Zyrtec prn  . Urinary frequency    urgency/takes Vesicare daily    Past Surgical History:  Procedure Laterality Date  . Aortobifemoral bypass    . APPLICATION OF WOUND VAC Left 10/25/2017   Procedure: Wound vac placement to left groin.;  Surgeon: Elam Dutch, MD;  Location: [place];  Service: Vascular;  Laterality: Left;  . APPLICATION OF WOUND VAC Left 11/10/2017   Procedure: APPLICATION OF WOUND VAC;  Surgeon: Elam Dutch, MD;  Location: [place];  Service: Vascular;  Laterality: Left;  . CARDIAC CATHETERIZATION  most recent 05/2011   total of 4  . CAROTID ANGIOGRAM N/A 06/05/2011   Procedure: CAROTID ANGIOGRAM;  Surgeon: Elam Dutch, MD;  Location: [facility] CATH LAB;  Service: Cardiovascular;  Laterality: N/A;  . CAROTID ENDARTERECTOMY  04/08/2011   left CEA  . cataract surgery Bilateral    left  . COLONOSCOPY    . COLONOSCOPY WITH PROPOFOL N/A 08/22/2013   Procedure: COLONOSCOPY WITH PROPOFOL;  Surgeon: Garlan Fair, MD;  Location: [facility];  Service: Endoscopy;  Laterality: N/A;  . CORONARY ARTERY BYPASS GRAFT  09/09/2011   Procedure: CORONARY ARTERY  BYPASS GRAFTING (CABG);  Surgeon: Gaye Pollack, MD;  Location: [place];  Service: Open Heart Surgery;  Laterality: N/A;  Coronary artery bypass grafting  x three with Right saphenous vein harvested endoscopically and left internal mammary artery  . ENDARTERECTOMY  04/08/2011   Procedure: ENDARTERECTOMY CAROTID;  Surgeon: Elam Dutch, MD;  Location: [facility] OR;  Service: Vascular;  Laterality: Left;  with patch angioplasty  . ENDARTERECTOMY  09/09/2011   Procedure: ENDARTERECTOMY CAROTID;  Surgeon: Elam Dutch, MD;  Location: [facility] OR;  Service: Vascular;  Laterality: Right;  with patch angioplasty  . FEMORAL-POPLITEAL BYPASS GRAFT Left 10/25/2017   Procedure: Left Common Femoral Endarterectomy and perfundaplasty, Left BYPASS GRAFT FEMORAL-BELOW KNEE POPLITEAL ARTERY.;  Surgeon: Elam Dutch, MD;  Location: [place];  Service: Vascular;  Laterality: Left;  . GROIN DEBRIDEMENT Left 11/10/2017   Procedure: GROIN DEBRIDEMENT;  Surgeon: Elam Dutch, MD;  Location: [place];  Service: Vascular;  Laterality: Left;  . LEFT HEART CATHETERIZATION WITH CORONARY ANGIOGRAM N/A 07/09/2011   Procedure: LEFT HEART CATHETERIZATION WITH CORONARY ANGIOGRAM;  Surgeon: Sinclair Grooms, MD;  Location: [facility] CATH LAB;  Service: Cardiovascular;  Laterality: N/A;  . LOWER EXTREMITY ANGIOGRAPHY N/A 10/11/2017   Procedure: LOWER EXTREMITY ANGIOGRAPHY;  Surgeon: Waynetta Sandy, MD;  Location: [place] [facility] LAB;  Service: Cardiovascular;  Laterality: N/A;  . Reimplantation of inferior mesenteric artery    . Repair of infrarenal abdominal aortic aneurysm    . SHOULDER ARTHROSCOPY W/ ROTATOR CUFF REPAIR     right and left-open procedures  . TONSILLECTOMY     at age 34  . TRIGGER FINGER RELEASE Right 06/28/2014   Procedure: RIGHT LONG TRIGGER RELEASE ;  Surgeon: Leanora Cover, MD;  Location: [place];  Service: Orthopedics;  Laterality: Right;  . wisdom teeth extracted     as a teenager  . WOUND  DEBRIDEMENT Left 11/05/2017   Procedure: DEBRIDEMENT WOUND LEFT GROIN with wound vac placement;  Surgeon: Elam Dutch, MD;  Location: [facility] OR;  Service: Vascular;  Laterality: Left;     reports that he quit smoking about 12 years ago. His smoking use included cigarettes. He quit after 40.00 years of use. He has never used smokeless tobacco. He reports that he drinks about 2.0 - 3.0 standard drinks of alcohol per week. He reports that he does not use drugs.  Allergies  Allergen Reactions  . Amoxicillin-Pot Clavulanate Nausea Only    Has patient had a PCN reaction causing immediate rash, facial/tongue/throat swelling, SOB or lightheadedness with hypotension: No Has patient had a PCN reaction causing severe rash involving mucus membranes or skin necrosis: No Has  patient had a PCN reaction that required hospitalization: No Has patient had a PCN reaction occurring within the last 10 years: No If all of the above answers are "NO", then may proceed with Cephalosporin use.   Marland Kitchen Morphine And Related Other (See Comments)    hallucinations    Family History  Problem Relation Age of Onset  . Heart disease Mother   . Heart disease Father   . Anesthesia problems Neg Hx   . Hypotension Neg Hx   . Malignant hyperthermia Neg Hx   . Pseudochol deficiency Neg Hx     Prior to Admission medications   Medication Sig Start Date End Date Taking? Authorizing Provider  acetaminophen (TYLENOL) 500 MG tablet Take 1,000 mg by mouth every 6 (six) hours as needed for headache (pain).   Yes [provider]  apixaban (ELIQUIS) 5 MG TABS tablet Take 1 tablet (5 mg total) by mouth 2 (two) times daily. 03/03/18 04/02/18 Yes Arrien, Jimmy Picket, MD  Ascorbic Acid (VITAMIN C PO) Take 1 tablet by mouth 2 (two) times daily.   Yes [provider]  aspirin EC 81 MG tablet Take 1 tablet (81 mg total) by mouth daily. 02/03/18  Yes Belva Crome, MD  atorvastatin (LIPITOR) 20 MG tablet Take 20 mg by  mouth at bedtime.    Yes [provider]  carvedilol (COREG) 25 MG tablet Take 1 tablet (25 mg total) by mouth 2 (two) times daily. 03/03/18 04/02/18 Yes Arrien, Jimmy Picket, MD  fenofibrate 54 MG tablet Take 1 tablet (54 mg total) by mouth daily. 03/03/18 04/02/18 Yes Arrien, Jimmy Picket, MD  gabapentin (NEURONTIN) 300 MG capsule Take 300 mg by mouth 2 (two) times daily.   Yes [provider]  levothyroxine (SYNTHROID, LEVOTHROID) 100 MCG tablet Take 100 mcg by mouth daily before breakfast.  01/07/18  Yes [provider]  methylcellulose (ARTIFICIAL TEARS) 1 % ophthalmic solution Place 1 drop into both eyes 2 (two) times daily as needed (dry eyes).   Yes [provider]  metolazone (ZAROXOLYN) 5 MG tablet Take 5 mg by mouth See admin instructions. Take one tablet (5 mg) by mouth when directed to do so by cardiologist.   Yes [provider]  Multiple Vitamin (MULTIVITAMIN WITH MINERALS) TABS tablet Take 1 tablet by mouth daily. 12/03/17  Yes Rhyne, Samantha J, PA-C  nitroGLYCERIN (NITROSTAT) 0.4 MG SL tablet PLACE 1 TABLET UNDER TONGUE EVERY 5 MINUTES AS NEEDED FOR CHEST PAIN. Patient taking differently: Place 0.4 mg under the tongue every 5 (five) minutes as needed for chest pain.  09/17/15  Yes Belva Crome, MD  oxymetazoline (VICKS SINEX) 0.05 % nasal spray Place 1 spray into both nostrils 2 (two) times daily as needed for congestion.   Yes [provider]  pioglitazone (ACTOS) 30 MG tablet Take 1 tablet (30 mg total) by mouth daily with lunch. 12/03/17  Yes Rhyne, Hulen Shouts, PA-C  pseudoephedrine-guaifenesin (MUCINEX D) 60-600 MG per tablet Take 1 tablet by mouth every 12 (twelve) hours as needed for congestion.    Yes [provider]  sodium chloride (OCEAN) 0.65 % SOLN nasal spray Place 1 spray into both nostrils 2 (two) times daily as needed for congestion.   Yes [provider]  tamsulosin (FLOMAX) 0.4 MG CAPS capsule  Take 0.4 mg by mouth every other day.   Yes [provider]  temazepam (RESTORIL) 15 MG capsule Take 15 mg by mouth at bedtime as needed for sleep.  Yes [provider]  torsemide (DEMADEX) 20 MG tablet Take 3 tablets (60 mg total) by mouth 2 (two) times daily. 02/03/18  Yes Belva Crome, MD  vitamin B-12 (CYANOCOBALAMIN) 500 MCG tablet Take 500 mcg by mouth daily.   Yes [provider]  zinc gluconate 50 MG tablet Take 50 mg by mouth daily.   Yes [provider]    Physical Exam: Vitals:   03/08/18 0148 03/08/18 0226 03/08/18 0301 03/08/18 0410  BP: (!) 120/59 (!) 126/54 (!) 111/47 126/64  Pulse: 71 84 93 (!) 104  Resp: 16 16 16 16   Temp: 97.7 F (36.5 C) 97.9 F (36.6 C) 97.8 F (36.6 C) 97.8 F (36.6 C)  TempSrc: Oral Oral Oral Oral  SpO2: 100% 97% 99% 97%  Weight:    113.6 kg  Height:        Physical Exam  Constitutional: He is oriented to person, place, and time. He appears well-developed and well-nourished. No distress.  HENT:  Head: Normocephalic and atraumatic.  Mouth/Throat: Oropharynx is clear and moist.  Eyes: Right eye exhibits no discharge. Left eye exhibits no discharge.  Neck: Neck supple. No tracheal deviation present.  Cardiovascular: Normal rate, regular rhythm and intact distal pulses.  Pulmonary/Chest: Effort normal and breath sounds normal.  Abdominal: Soft. Bowel sounds are normal. He exhibits no distension. There is no tenderness.  Musculoskeletal: He exhibits edema.  +3 bilateral lower extremity pitting edema Chronic venous stasis changes and fluid-filled skin blisters noted  Neurological: He is alert and oriented to person, place, and time.  Skin: Skin is warm and dry.  Psychiatric: He has a normal mood and affect. His behavior is normal.     Labs on Admission: I have personally reviewed following labs and imaging studies  CBC: Recent Labs  Lab 03/07/18 1832  WBC 6.3  NEUTROABS 4.5  HGB 8.8*  HCT 29.1*   MCV 93.3  PLT 937   Basic Metabolic Panel: Recent Labs  Lab 03/01/18 0712 03/02/18 0252 03/03/18 0316 03/07/18 1832  NA 136 135 134* 134*  K 3.0* 3.9 4.1 4.0  CL 89* 91* 91* 95*  CO2 34* 32 33* 30  GLUCOSE 127* 149* 138* 191*  BUN 69* 75* 80* 95*  CREATININE 1.94* 1.91* 2.06* 2.13*  CALCIUM 9.1 9.0 9.0 8.7*   GFR: Estimated Creatinine Clearance: 35.5 mL/min (A) (by C-G formula based on SCr of 2.13 mg/dL (H)). Liver Function Tests: Recent Labs  Lab 03/07/18 1832  AST 32  ALT 12  ALKPHOS 27*  BILITOT 0.6  PROT 6.2*  ALBUMIN 3.0*   No results for input(s): LIPASE, AMYLASE in the last 168 hours. No results for input(s): AMMONIA in the last 168 hours. Coagulation Profile: Recent Labs  Lab 03/07/18 1832  INR 1.79   Cardiac Enzymes: No results for input(s): CKTOTAL, CKMB, CKMBINDEX, TROPONINI in the last 168 hours. BNP (last 3 results) Recent Labs    02/03/18 1217 02/24/18 1243  PROBNP 10,666* 17,251*   HbA1C: No results for input(s): HGBA1C in the last 72 hours. CBG: Recent Labs  Lab 03/02/18 1746 03/02/18 2119 03/03/18 0827 03/03/18 1146 03/07/18 2352  GLUCAP 152* 225* 131* 189* 187*   Lipid Profile: No results for input(s): CHOL, HDL, LDLCALC, TRIG, CHOLHDL, LDLDIRECT in the last 72 hours. Thyroid Function Tests: No results for input(s): TSH, T4TOTAL, FREET4, T3FREE, THYROIDAB in the last 72 hours. Anemia Panel: No results for input(s): VITAMINB12, FOLATE, FERRITIN, TIBC, IRON, RETICCTPCT in the last 72 hours. Urine  analysis:    Component Value Date/Time   COLORURINE YELLOW 11/15/2017 1004   APPEARANCEUR CLEAR 11/15/2017 1004   LABSPEC 1.008 11/15/2017 1004   PHURINE 6.0 11/15/2017 1004   GLUCOSEU NEGATIVE 11/15/2017 1004   HGBUR MODERATE (A) 11/15/2017 1004   BILIRUBINUR NEGATIVE 11/15/2017 1004   KETONESUR NEGATIVE 11/15/2017 1004   PROTEINUR NEGATIVE 11/15/2017 1004   UROBILINOGEN 4.0 (H) 09/23/2011 1335   NITRITE NEGATIVE 11/15/2017  1004   LEUKOCYTESUR SMALL (A) 11/15/2017 1004    Radiological Exams on Admission: No results found.  EKG: Independently reviewed.  Atrial flutter with heart rate 95. Also showing right bundle branch block and QTC prolongation (544), similar to prior EKG.   Assessment/Plan Principal Problem:   GI bleed Active Problems:   Diabetes mellitus type 2 with complications (HCC)   Chronic kidney disease, stage III (moderate) (HCC)   Benign essential HTN   Symptomatic anemia   Atrial fibrillation (HCC)   Hyperlipidemia   Chronic back pain   Combined systolic and diastolic heart failure (HCC)   Symptomatic anemia secondary to suspected upper GI bleed Patient is presenting with a 3-day history of melena in the setting of home Eliquis use for atrial fibrillation.  Also having dyspnea on exertion and generalized weakness.  He has a history of GERD and hemorrhoids.  No prior EGD or colonoscopy in the chart.  FOBT positive.  He was tachycardic on arrival but blood pressure stable. Hemoglobin 8.8; was 10.5 nine days ago.   -Monitor on telemetry -1 unit of packed red blood cells for symptomatic anemia.  Discuss risks versus benefits of blood transfusion with the patient.  He agreed to get a blood transfusion. -IV Protonix 40 mg twice daily -Blood pressure currently stable.  No further episodes of melena in the hospital. Avoid aggressive IV fluid resuscitation in the setting of history of combined systolic and diastolic congestive heart failure and +3 bilateral lower extremity pitting edema.  Will give gentle IV fluids.  -ED provider informed me that they spoke to Dr. Cristina Gong from [place] who recommended keeping the patient n.p.o. GI will see the patient in the morning. -CBC in a.m. -Hold home Eliquis and aspirin -Monitor on telemetry  Atrial fibrillation -Diagnosed during his previous hospitalization in October.  [id number] 6. Current EKG showing atrial flutter, rate controlled.  -Hold Eliquis in  the setting of GI bleed -Continue home Coreg for rate control  -Repeat EKG in the morning -Monitor on telemetry  Chronic combined systolic and diastolic congestive heart failure -Appears volume overloaded with +3 pitting bilateral lower extremity edema.  However, lungs clear on exam. -Continue home Coreg -Hold home torsemide and metolazone  CKD stage III GFR 28 and serum creatinine 2.1; recent baseline 1.9-2.1.  -Continue to monitor; BMP in a.m.  Type 2 diabetes A1c 6.2 in May 2019. -Sliding scale insulin -CBG checks -Hold home pioglitazone  Hypothyroidism -Continue home levothyroxine  Hypertension -Continue home Coreg  Hyperlipidemia -Continue home Lipitor and fenofibrate  Chronic back pain -Tylenol PRN -Continue home Neurontin  BPH -Hold home tamsulosin at this time  Insomnia -Continue home Restoril  DVT prophylaxis: SCDs Code Status: Patient wishes to be full code. Family Communication: No family present at bedside. Disposition Plan: Anticipate discharge to home in 1 to 2 days. Consults called: GI (Dr. Cristina Gong) Admission status: Inpatient It is my clinical opinion that admission to INPATIENT is reasonable and necessary in this 77 y.o. male . presenting with symptoms of melena, generalized weakness, dyspnea on exertion concerning for  upper GI bleed . in the context of PMH including: A. fib on anticoagulation . and pertinent positives on radiographic and laboratory data including: Acute on chronic anemia, FOBT positive . Workup and treatment include IV PPI, IV fluid.  Further work-up including EGD pending.  Given the aforementioned, the predictability of an adverse outcome is felt to be significant. I expect that the patient will require at least 2 midnights in the hospital to treat this condition.    Shela Leff MD [facility] Pager [phone]  If 7PM-7AM, please contact night-coverage [email or web address] Password [email or web address]  03/08/2018, 4:15 AM

## 2018-03-08 NOTE — Progress Notes (Signed)
Pt asking about plans and if he can eat. Paged MD

## 2018-03-08 NOTE — Telephone Encounter (Signed)
Spoke with wife to get update on pt.  She states pt NPO since arrival and she believes they are going to be doing some tests today.  Advised to keep [place] updated in the event we need to move appt next week.  Wife appreciative for call.

## 2018-03-08 NOTE — Telephone Encounter (Signed)
Pt hospitalized.  See other phone note.

## 2018-03-09 ENCOUNTER — Inpatient Hospital Stay (HOSPITAL_COMMUNITY): Payer: Medicare HMO

## 2018-03-09 DIAGNOSIS — I10 Essential (primary) hypertension: Secondary | ICD-10-CM

## 2018-03-09 DIAGNOSIS — I4891 Unspecified atrial fibrillation: Secondary | ICD-10-CM

## 2018-03-09 DIAGNOSIS — I5040 Unspecified combined systolic (congestive) and diastolic (congestive) heart failure: Secondary | ICD-10-CM

## 2018-03-09 DIAGNOSIS — D500 Iron deficiency anemia secondary to blood loss (chronic): Secondary | ICD-10-CM

## 2018-03-09 DIAGNOSIS — I455 Other specified heart block: Secondary | ICD-10-CM

## 2018-03-09 DIAGNOSIS — D649 Anemia, unspecified: Secondary | ICD-10-CM

## 2018-03-09 DIAGNOSIS — E785 Hyperlipidemia, unspecified: Secondary | ICD-10-CM

## 2018-03-09 LAB — CBC
HCT: 30 % — ABNORMAL LOW (ref 39.0–52.0)
Hemoglobin: 9.5 g/dL — ABNORMAL LOW (ref 13.0–17.0)
MCH: 29.1 pg (ref 26.0–34.0)
MCHC: 31.7 g/dL (ref 30.0–36.0)
MCV: 91.7 fL (ref 80.0–100.0)
PLATELETS: 189 10*3/uL (ref 150–400)
RBC: 3.27 MIL/uL — ABNORMAL LOW (ref 4.22–5.81)
RDW: 17.9 % — AB (ref 11.5–15.5)
WBC: 3.9 10*3/uL — AB (ref 4.0–10.5)
nRBC: 0 % (ref 0.0–0.2)

## 2018-03-09 LAB — BPAM RBC
Blood Product Expiration Date: 201911022359
ISSUE DATE / TIME: 201910150234
Unit Type and Rh: 6200

## 2018-03-09 LAB — BASIC METABOLIC PANEL
Anion gap: 9 (ref 5–15)
BUN: 71 mg/dL — ABNORMAL HIGH (ref 8–23)
CALCIUM: 8.6 mg/dL — AB (ref 8.9–10.3)
CO2: 29 mmol/L (ref 22–32)
CREATININE: 1.83 mg/dL — AB (ref 0.61–1.24)
Chloride: 98 mmol/L (ref 98–111)
GFR, EST AFRICAN AMERICAN: 39 mL/min — AB (ref >60)
GFR, EST NON AFRICAN AMERICAN: 34 mL/min — AB (ref >60)
Glucose, Bld: 140 mg/dL — ABNORMAL HIGH (ref 70–99)
Potassium: 3.6 mmol/L (ref 3.5–5.1)
SODIUM: 136 mmol/L (ref 135–145)

## 2018-03-09 LAB — GLUCOSE, CAPILLARY
GLUCOSE-CAPILLARY: 137 mg/dL — AB (ref 70–99)
GLUCOSE-CAPILLARY: 129 mg/dL — AB (ref 70–99)
GLUCOSE-CAPILLARY: 204 mg/dL — AB (ref 70–99)
Glucose-Capillary: 213 mg/dL — ABNORMAL HIGH (ref 70–99)

## 2018-03-09 LAB — TYPE AND SCREEN
ABO/RH(D): A POS
Antibody Screen: NEGATIVE
Unit division: 0

## 2018-03-09 LAB — LIPASE, BLOOD: Lipase: 38 U/L (ref 11–51)

## 2018-03-09 MED ORDER — TORSEMIDE 20 MG PO TABS
60.0000 mg | ORAL_TABLET | Freq: Every day | ORAL | Status: DC
  Administered 2018-03-10: 60 mg via ORAL
  Filled 2018-03-09: qty 3

## 2018-03-09 MED ORDER — POTASSIUM CHLORIDE CRYS ER 20 MEQ PO TBCR
40.0000 meq | EXTENDED_RELEASE_TABLET | Freq: Every day | ORAL | Status: DC
  Administered 2018-03-10 – 2018-03-11 (×2): 40 meq via ORAL
  Filled 2018-03-09 (×2): qty 2

## 2018-03-09 MED ORDER — SODIUM CHLORIDE 0.9 % IV SOLN: INTRAVENOUS | Status: DC

## 2018-03-09 MED ORDER — GADOBUTROL 1 MMOL/ML IV SOLN
10.0000 mL | Freq: Once | INTRAVENOUS | Status: AC | PRN
  Administered 2018-03-09: 10 mL via INTRAVENOUS

## 2018-03-09 NOTE — Progress Notes (Signed)
Pt MEWS is a 2, pt alert and oriented, hr has been 100-110's since admission, cardiology saw pt yesterday, coreg given this am, no distress and other vss other than hr, pt c/o back pain, percocet given as order per prn, pt c/o covers not fixed and he bent over to fix them up off the floor and now his back hurts, pt repositioned and instructed to call for assistance " I did but that guy fiddled with the covers but they were still wadded up" advised my number was on the board and he could call me directly, call l ight in reach, bed alarm on

## 2018-03-09 NOTE — Progress Notes (Addendum)
PROGRESS NOTE  COREE BRAME VFI:[id number] DOB: December 05, 1940 DOA: 03/07/2018 PCP: Lavone Orn, MD   LOS: 2 days   Brief narrative:  Patient is a 77 years old male with past medical history of diabetes mellitus, GERD, hypertension, hyperlipidemia, hemorrhoids, hypothyroidism presented to hospital with 3-day history of dark sticky stools.  Patient was on Eliquis for atrial fibrillation.  Patient was admitted for GI bleeding with  a hemoglobin of 8.8 on presentation.  Received 1 unit of packed RBC with appropriate rise in hemoglobin.  Patient does have a history of lower extremity edema with blister and history of congestive heart failure as well.  Assessment/Plan:  Principal Problem:   GI bleed Active Problems:   Diabetes mellitus type 2 with complications (HCC)   Chronic kidney disease, stage III (moderate) (HCC)   Benign essential HTN   Symptomatic anemia   Atrial fibrillation (HCC)   Hyperlipidemia   Chronic back pain   Combined systolic and diastolic heart failure (HCC)  Upper GI bleeding.  Seen by GI.  Could be secondary to aspirin induced gastropathy with chronic anticoagulation.  He does have a history of triple AAA repair.  CT scan of the abdomen and pelvis showed some subtle changes over the pancreas.  There is no mention of aortic abnormality on the CT scan was noncontrast CT scan.  paroxysmal atrial fibrillation.  Off anticoagulation currently.  Symptomatic anemia secondary to acute blood loss improved.  epigastric pain/back pain.  Scan of the abdomen showed some subtle changes over the tail of the pancreas.  We will add a lipase to the morning labs.  Patient is currently n.p.o.  Hypokalemia replenished and improved.  AKI on chronic kidney disease.    Improving creatinine levels.  Patient received gentle IV fluid hydration which was discontinued due to CHF.  History of congestive heart failure with bilateral lower extremity edema and blisters.  Continue on Coreg  Lipitor.  Resume torsemide tomorrow a.m.  He takes torsemide 60 mg twice a day.  Local wound care for the legs and blisters.  Code Status:  Full code  Family Communication: None available at bedside  Disposition Plan: Awaiting for EGD today.  Will get physical therapy evaluation.   Consultants:  GI  Procedures:  Awaiting for EGD  Antibiotics:  None  HPI/Subjective: Patient complaints of epigastric and back pain.  No nausea vomiting or diarrhea.  No further episodes of melena.  Objective: Vitals:   03/09/18 0401 03/09/18 0822  BP: 121/65 126/73  Pulse: (!) 112 (!) 116  Resp: 18   Temp: (!) 97.5 F (36.4 C)   SpO2: 92% 97%    Intake/Output Summary (Last 24 hours) at 03/09/2018 0906 Last data filed at 03/09/2018 0800 Gross per 24 hour  Intake 2411.37 ml  Output 1476 ml  Net 935.37 ml   Filed Weights   03/07/18 2254 03/08/18 0410 03/09/18 0401  Weight: 113.6 kg 113.6 kg 114.4 kg    Physical Exam:  General: Alert awake, not in obvious distress.  Average built.  HEENT: PERRLA , oral mucosa is moist.  No pharyngeal erythema  CVS: S1-S2 heard, no murmur.  Regular rate and rhythm.  Respiratory: Diminished breath sounds bilaterally.  No crackles or wheezes  Abdomen: Soft, gastric tenderness on palpation.  Bowel sounds are present.  Nondistended  CNS: Alert awake oriented.  No focal neurological deficits.  SKIN: Dry.  Blister on the leg  Musculoskeletal: Normal muscle tone, power and bulk.  Bilateral lower extremity with mild edema.  Left leg edema with blisters.  Data Review: I have personally reviewed the following laboratory data and studies,  CBC: Recent Labs  Lab 03/07/18 1832 03/08/18 0759 03/09/18 0353  WBC 6.3 5.5 3.9*  NEUTROABS 4.5  --   --   HGB 8.8* 9.6* 9.5*  HCT 29.1* 31.1* 30.0*  MCV 93.3 91.5 91.7  PLT 206 220 654   Basic Metabolic Panel: Recent Labs  Lab 03/03/18 0316 03/07/18 1832 03/08/18 0759 03/09/18 0353  NA 134* 134*  135 136  K 4.1 4.0 3.4* 3.6  CL 91* 95* 98 98  CO2 33* 30 29 29   GLUCOSE 138* 191* 181* 140*  BUN 80* 95* 85* 71*  CREATININE 2.06* 2.13* 1.96* 1.83*  CALCIUM 9.0 8.7* 8.7* 8.6*   Liver Function Tests: Recent Labs  Lab 03/07/18 1832  AST 32  ALT 12  ALKPHOS 27*  BILITOT 0.6  PROT 6.2*  ALBUMIN 3.0*   No results for input(s): LIPASE, AMYLASE in the last 168 hours. No results for input(s): AMMONIA in the last 168 hours. Cardiac Enzymes: No results for input(s): CKTOTAL, CKMB, CKMBINDEX, TROPONINI in the last 168 hours. BNP (last 3 results) Recent Labs    02/25/18 1340  BNP 1,809.7*    ProBNP (last 3 results) Recent Labs    02/03/18 1217 02/24/18 1243  PROBNP 10,666* 17,251*    CBG: Recent Labs  Lab 03/08/18 0728 03/08/18 1209 03/08/18 1522 03/08/18 2140 03/09/18 0729  GLUCAP 165* 127* 121* 192* 137*   No results found for this or any previous visit (from the past 240 hour(s)).   Studies: Ct Abdomen Pelvis Wo Contrast  Result Date: 03/08/2018 CLINICAL DATA:  77 year old male with history of diffuse abdominal pain and renal insufficiency. EXAM: CT ABDOMEN AND PELVIS WITHOUT CONTRAST TECHNIQUE: Multidetector CT imaging of the abdomen and pelvis was performed following the standard protocol without IV contrast. COMPARISON:  CT the abdomen and pelvis 09/22/2007. FINDINGS: Lower chest: Cardiomegaly. Mild calcifications of the aortic valve. Atherosclerotic calcifications in the descending thoracic aorta, as well as the left anterior descending, left circumflex and right coronary arteries. Trace bilateral pleural effusions lying dependently. Patchy areas of mild ground-glass attenuation and interlobular septal thickening in the visualize lung bases, suggesting a background of very mild interstitial pulmonary edema. Hepatobiliary: No definite cystic or solid hepatic lesions are confidently identified on today's noncontrast CT examination. Gallbladder is only moderately  distended. There is a small amount of haziness adjacent to the gallbladder, which may reflect some edema in the gallbladder wall. No overt surrounding inflammatory changes. No definite calcified gallstones are identified. Pancreas: Adjacent to the tail of the pancreas there are subtle inflammatory changes. Extending cephalad from the tail of the pancreas there is a 4.1 x 2.9 x 3.7 cm centrally low-attenuation lesion (axial image 23 of series 3 and coronal image 65 of series 6) with some peripheral soft tissue thickening, intimately associated with the proximal stomach, not characterized on today's noncontrast CT examination, but favored to represent a pancreatic pseudocyst. Spleen: Unremarkable. Adrenals/Urinary Tract: Mild atrophy of both kidneys. In the upper pole of the right kidney there is a 1.5 cm high attenuation lesion (axial image 27 of series 3) which is incompletely characterized on today's noncontrast CT examination. Likewise there is a 9 mm high attenuation lesion in the lower pole the left kidney which is also not characterized. Extrarenal pelvis on the left (normal anatomical variant) again noted. No hydroureteronephrosis. Urinary bladder is normal in appearance. Bilateral adrenal glands are normal  in appearance. Stomach/Bowel: As mentioned above, the lesion in the tail of the pancreas is intimately associated with the proximal stomach along the under surface and greater curvature. Distal aspect of the stomach is otherwise unremarkable. No pathologic dilatation of small bowel or colon. Normal appendix. Vascular/Lymphatic: Aortic atherosclerosis. Postoperative changes of graft replacement of the common and external iliac arteries bilaterally. No lymphadenopathy noted in the abdomen or pelvis. Reproductive: Prostate gland and seminal vesicles are unremarkable in appearance. Other: Trace volume of perihepatic ascites.  No pneumoperitoneum. Musculoskeletal: There are no aggressive appearing lytic or  blastic lesions noted in the visualized portions of the skeleton. IMPRESSION: 1. Subtle inflammatory changes adjacent to the tail of the pancreas where there is also a lesion extending cephalad in contact with the proximal stomach. These findings likely reflect pancreatitis with a small pancreatic pseudocyst, however, further characterization with MRI of the abdomen with and without IV gadolinium with MRCP is strongly recommended in the near future to exclude the possibility of pancreatic neoplasm. 2. Subtle haziness adjacent to the gallbladder favored to reflect some gallbladder wall edema. No calcified cholelithiasis. Gallbladder is not distended, and there are no overt surrounding inflammatory changes to strongly suggest an acute cholecystitis at this time. Attention at time of forthcoming MRI examination is recommended. 3. Trace volume of perihepatic ascites. 4. Two small high attenuation lesions in the left kidney. These are favored to represent small proteinaceous/hemorrhagic cysts, but are incompletely characterized on today's noncontrast CT examination. Attention at time of forthcoming MRI of the abdomen is recommended for definitive characterization. 5. There are calcifications of the aortic valve. Echocardiographic correlation for evaluation of potential valvular dysfunction may be warranted if clinically indicated. 6. Aortic atherosclerosis, in addition to at least 3 vessel coronary artery disease. Assessment for potential risk factor modification, dietary therapy or pharmacologic therapy may be warranted, if clinically indicated. 7. There are calcifications of the aortic valve. Echocardiographic correlation for evaluation of potential valvular dysfunction may be warranted if clinically indicated. 8. Cardiomegaly with evidence of mild interstitial pulmonary edema in the visualize lung bases, and trace bilateral pleural effusions; findings suggestive of congestive heart failure. Electronically Signed   By:  Vinnie Langton M.D.   On: 03/08/2018 11:21    Scheduled Meds: . atorvastatin  20 mg Oral QHS  . carvedilol  25 mg Oral BID WC  . fenofibrate  54 mg Oral Daily  . gabapentin  300 mg Oral BID  . insulin aspart  0-9 Units Subcutaneous TID WC  . levothyroxine  100 mcg Oral QAC breakfast  . multivitamin with minerals  1 tablet Oral Daily  . pantoprazole  40 mg Intravenous Q12H  . vitamin B-12  500 mcg Oral Daily  . vitamin C  250 mg Oral BID  . zinc sulfate  220 mg Oral Daily   Continuous Infusions:  Time spent: More than 50% of that time was spent in counseling and/or coordination of care.  [facility] Trachelle Low  [facility] Pager [phone].  If 7PM-7AM, please contact night-coverage at [email or web address], password [facility] 03/09/2018, 9:06 AM

## 2018-03-09 NOTE — Consult Note (Signed)
[facility] Nurse wound consult note Reason for Consult: Consult requested for left leg.  Pt states he developed blisters this week.  He has compression stockings prior to admission but they are difficult to apply.  Wound type: Left anterior calf with patchy areas of partial thickness skin loss where previous blisters have ruptured; 3X1X.1cm and .5X.5X.1cm Posterior leg with clear fluid filled blister, ruptures easily when touched.  Skin left in place over the location; 7X2x.1cm, pink moist wound bed, mod amt yellow drainage. Generalized edema to LLE. Dressing procedure/placement/frequency: Xeroform gauze to promote drying and healing.  Discussed plan of care with patient and family member. Please re-consult if further assistance is needed.  Thank-you,  Julien Girt MSN, [place], [place], [place], [place]

## 2018-03-09 NOTE — Progress Notes (Signed)
Patient was stable overnight, except for recurrent back pain.  Have one stool, partly dark and partly brown in character.  Hemoglobin stable, BUN improved although still elevated due to chronic renal insufficiency.  Yesterday CT scan, which I reviewed over the telephone with the radiologist, did not show any overt evidence for an aortoenteric fistula, although could not exclude it.  It did show pancreatic inflammation versus mass, for which reason I have ordered an MRI of the abdomen with low-dose contrast, as recommended by the radiologist.  Lipase on this morning's blood work came back normal.  I have ordered a CA 19 9 to help screen for pancreatic cancer.  The CT findings were discussed with the patient and his wife.  It appears he now has quiescent GI bleeding which would most likely have arisen from aspirin induced gastropathy while on anticoagulation with Eliquis.  His back pain most likely is related to his pancreatitis, although musculoskeletal etiology would also be possible because it increased this morning when he was reaching over to pull up a blanket.  We will await the results of the MRI scan and tentatively plan for upper endoscopy tomorrow to clarify whether or not ulcer disease is present, to help guide future therapy.  Cleotis Nipper, M.D. Pager [phone] If no answer or after 5 PM call [phone]

## 2018-03-10 ENCOUNTER — Inpatient Hospital Stay (HOSPITAL_COMMUNITY): Payer: Medicare HMO | Admitting: Certified Registered Nurse Anesthetist

## 2018-03-10 ENCOUNTER — Ambulatory Visit: Payer: Medicare HMO | Admitting: Vascular Surgery

## 2018-03-10 ENCOUNTER — Encounter (HOSPITAL_COMMUNITY): Payer: Self-pay | Admitting: Certified Registered Nurse Anesthetist

## 2018-03-10 ENCOUNTER — Encounter (HOSPITAL_COMMUNITY)
Admission: EM | Disposition: A | Discharge status: Home / Self Care | Payer: Self-pay | Source: Home / Self Care | Attending: Internal Medicine

## 2018-03-10 HISTORY — PX: ESOPHAGOGASTRODUODENOSCOPY (EGD) WITH PROPOFOL: SHX5813

## 2018-03-10 LAB — BASIC METABOLIC PANEL
ANION GAP: 10 (ref 5–15)
BUN: 58 mg/dL — AB (ref 8–23)
CALCIUM: 8.9 mg/dL (ref 8.9–10.3)
CO2: 27 mmol/L (ref 22–32)
CREATININE: 1.88 mg/dL — AB (ref 0.61–1.24)
Chloride: 97 mmol/L — ABNORMAL LOW (ref 98–111)
GFR calc Af Amer: 38 mL/min — ABNORMAL LOW (ref >60)
GFR calc non Af Amer: 33 mL/min — ABNORMAL LOW (ref >60)
GLUCOSE: 185 mg/dL — AB (ref 70–99)
Potassium: 3.9 mmol/L (ref 3.5–5.1)
Sodium: 134 mmol/L — ABNORMAL LOW (ref 135–145)

## 2018-03-10 LAB — CBC
HCT: 32.2 % — ABNORMAL LOW (ref 39.0–52.0)
Hemoglobin: 10.1 g/dL — ABNORMAL LOW (ref 13.0–17.0)
MCH: 29.1 pg (ref 26.0–34.0)
MCHC: 31.4 g/dL (ref 30.0–36.0)
MCV: 92.8 fL (ref 80.0–100.0)
PLATELETS: 218 10*3/uL (ref 150–400)
RBC: 3.47 MIL/uL — AB (ref 4.22–5.81)
RDW: 17.7 % — ABNORMAL HIGH (ref 11.5–15.5)
WBC: 9 10*3/uL (ref 4.0–10.5)
nRBC: 0 % (ref 0.0–0.2)

## 2018-03-10 LAB — GLUCOSE, CAPILLARY
GLUCOSE-CAPILLARY: 145 mg/dL — AB (ref 70–99)
GLUCOSE-CAPILLARY: 195 mg/dL — AB (ref 70–99)
GLUCOSE-CAPILLARY: 130 mg/dL — AB (ref 70–99)
Glucose-Capillary: 174 mg/dL — ABNORMAL HIGH (ref 70–99)

## 2018-03-10 LAB — LIPASE, BLOOD: Lipase: 39 U/L (ref 11–51)

## 2018-03-10 SURGERY — ESOPHAGOGASTRODUODENOSCOPY (EGD) WITH PROPOFOL
Anesthesia: Monitor Anesthesia Care

## 2018-03-10 MED ORDER — TORSEMIDE 20 MG PO TABS
60.0000 mg | ORAL_TABLET | Freq: Two times a day (BID) | ORAL | Status: DC
  Administered 2018-03-10 – 2018-03-11 (×2): 60 mg via ORAL
  Filled 2018-03-10 (×2): qty 3

## 2018-03-10 MED ORDER — LORAZEPAM 2 MG/ML IJ SOLN: 1.0000 mg | Freq: Once | INTRAMUSCULAR | Status: DC

## 2018-03-10 MED ORDER — LACTATED RINGERS IV SOLN
INTRAVENOUS | Status: DC
  Administered 2018-03-10 (×2): via INTRAVENOUS

## 2018-03-10 MED ORDER — LIDOCAINE HCL (CARDIAC) PF 100 MG/5ML IV SOSY
PREFILLED_SYRINGE | INTRAVENOUS | Status: DC | PRN
  Administered 2018-03-10: 100 mg via INTRAVENOUS

## 2018-03-10 MED ORDER — PROPOFOL 500 MG/50ML IV EMUL
INTRAVENOUS | Status: DC | PRN
  Administered 2018-03-10: 75 ug/kg/min via INTRAVENOUS

## 2018-03-10 MED ORDER — PROPOFOL 10 MG/ML IV BOLUS
INTRAVENOUS | Status: DC | PRN
  Administered 2018-03-10: 30 mg via INTRAVENOUS

## 2018-03-10 SURGICAL SUPPLY — 15 items

## 2018-03-10 NOTE — Progress Notes (Addendum)
PROGRESS NOTE  [name] KDX:[id number] DOB: 02/10/1941 DOA: 03/07/2018 PCP: Lavone Orn, MD   LOS: 3 days   Brief narrative:  Patient is a 77 years old male with past medical history of diabetes mellitus, GERD, hypertension, hyperlipidemia, hemorrhoids, hypothyroidism presented to hospital with 3-day history of dark sticky stools.  Patient was on Eliquis for atrial fibrillation.  Patient was admitted for GI bleeding with  a hemoglobin of 8.8 on presentation.  Received 1 unit of packed RBC with appropriate rise in hemoglobin.  Patient does have a history of lower extremity edema with blister and history of congestive heart failure as well.  Assessment/Plan:  Principal Problem:   GI bleed Active Problems:   Diabetes mellitus type 2 with complications (HCC)   Chronic kidney disease, stage III (moderate) (HCC)   Benign essential HTN   Symptomatic anemia   Atrial fibrillation (HCC)   Hyperlipidemia   Chronic back pain   Combined systolic and diastolic heart failure (HCC)  Upper GI bleeding.  Underwent a CT scan of the abdomen pelvis and MRI to rule out aortic enteric fistula.  MRI however showed some cystic lesion in the pancreas.  Will need to follow-up with GI as outpatient.  He does have an appointment to follow Dr. Cristina Gong on November 21.  Patient to undergo endoscopy to rule out peptic ulcer disease today.  Paroxysmal atrial fibrillation.  Off anticoagulation currently.  Symptomatic anemia secondary to acute blood loss improved after transfusion.  Hemoglobin of 10.1 and is stable.  Epigastric pain/back pain.  Denies abdominal pain.  Scan of the abdomen showed some subtle changes over the tail of the pancreas is a cystic lesion as per MRI.  This was negative.Hypokalemia replenished and improved.  AKI on chronic kidney disease.    Stable creatinine levels with slight improvement.    History of congestive heart failure with bilateral lower extremity edema and blisters.   Continue on Coreg, Lipitor.  On torsemide doses.  Continue local wound care for the legs and blisters. Will resume home doses of torsemide starting tonight.   Code Status:  Full code  Family Communication: Spoke with the patient's wife on the phone and updated about the patient   Disposition Plan: Awaiting for EGD today.  Physical therapy has seen the patient and recommended no skilled therapy needs.  Consultants:  GI  Procedures:  Awaiting for EGD  Antibiotics:  None  HPI/Subjective: Patient denies obvious abdominal pain but has mild back pain.  No emesis or melena.  Objective: Vitals:   03/10/18 1240 03/10/18 1250  BP: 124/69 137/75  Pulse: (!) 111 (!) 112  Resp: 18 19  Temp: 98 F (36.7 C)   SpO2: 94% 99%    Intake/Output Summary (Last 24 hours) at 03/10/2018 1254 Last data filed at 03/10/2018 1219 Gross per 24 hour  Intake 900 ml  Output 900 ml  Net 0 ml   Filed Weights   03/09/18 0401 03/10/18 0400 03/10/18 1108  Weight: 114.4 kg 115.2 kg 115.2 kg   Physical Examination: General exam: Appears calm and comfortable ,Not in distress HEENT:PERRL,Oral mucosa moist Respiratory system: Bilateral equal air entry, normal vesicular breath sounds, no wheezes or crackles  Cardiovascular system: S1 & S2 heard, RRR.  Gastrointestinal system: Abdomen is nondistended, soft mild epigastric tenderness positive.   No organomegaly or masses felt. Normal bowel sounds heard. Central nervous system: Alert and oriented. No focal neurological deficits. Extremities: No edema, no clubbing ,no cyanosis, distal peripheral pulses palpable.  Blister  on the leg with a wrap.  Bilateral lower extremity mild erythema and edema. Skin: No rashes, lesions or ulcers,no icterus ,no pallor MSK: Normal muscle bulk,tone ,power   Data Review: I have personally reviewed the following laboratory data and studies,  CBC: Recent Labs  Lab 03/07/18 1832 03/08/18 0759 03/09/18 0353 03/10/18 0455    WBC 6.3 5.5 3.9* 9.0  NEUTROABS 4.5  --   --   --   HGB 8.8* 9.6* 9.5* 10.1*  HCT 29.1* 31.1* 30.0* 32.2*  MCV 93.3 91.5 91.7 92.8  PLT 206 220 189 993   Basic Metabolic Panel: Recent Labs  Lab 03/07/18 1832 03/08/18 0759 03/09/18 0353 03/10/18 0455  NA 134* 135 136 134*  K 4.0 3.4* 3.6 3.9  CL 95* 98 98 97*  CO2 30 29 29 27   GLUCOSE 191* 181* 140* 185*  BUN 95* 85* 71* 58*  CREATININE 2.13* 1.96* 1.83* 1.88*  CALCIUM 8.7* 8.7* 8.6* 8.9   Liver Function Tests: Recent Labs  Lab 03/07/18 1832  AST 32  ALT 12  ALKPHOS 27*  BILITOT 0.6  PROT 6.2*  ALBUMIN 3.0*   Recent Labs  Lab 03/09/18 0353 03/10/18 0455  LIPASE 38 39   No results for input(s): AMMONIA in the last 168 hours. Cardiac Enzymes: No results for input(s): CKTOTAL, CKMB, CKMBINDEX, TROPONINI in the last 168 hours. BNP (last 3 results) Recent Labs    02/25/18 1340  BNP 1,809.7*    ProBNP (last 3 results) Recent Labs    02/03/18 1217 02/24/18 1243  PROBNP 10,666* 17,251*    CBG: Recent Labs  Lab 03/09/18 1243 03/09/18 1627 03/09/18 2110 03/10/18 0746 03/10/18 1120  GLUCAP 129* 204* 213* 174* 145*   No results found for this or any previous visit (from the past 240 hour(s)).   Studies: Mr Abdomen Mrcp W Wo Contast  Result Date: 03/09/2018 CLINICAL DATA:  Abdominal pain, dark stools and pancreatitis. History of diabetes, hypertension and renal insufficiency. EXAM: MRI ABDOMEN WITHOUT AND WITH CONTRAST (INCLUDING MRCP) TECHNIQUE: Multiplanar multisequence MR imaging of the abdomen was performed both before and after the administration of intravenous contrast. Heavily T2-weighted images of the biliary and pancreatic ducts were obtained, and three-dimensional MRCP images were rendered by post processing. CONTRAST:  10 cc Gadavist COMPARISON:  Abdominopelvic CT 03/08/2018. FINDINGS: Despite efforts by the technologist and patient, mild motion artifact is present on today's exam and could not  be eliminated. This reduces exam sensitivity and specificity. The thin section MRCP images are essentially nondiagnostic. Lower chest: Small dependent bilateral pleural effusions. The heart is enlarged status post median sternotomy. Hepatobiliary: The liver demonstrates mild periportal edema, but no significant steatosis, focal lesion or abnormal enhancement. There is some pericholecystic fluid, but no gallbladder wall thickening, abnormal enhancement or biliary dilatation. As above, the thin section dedicated MRCP images are limited. Pancreas: As demonstrated on recent CT, there is a cystic mass associated with the pancreatic tail. This measures 3.2 x 2.7 x 3.1 cm and demonstrates central high T2 signal and mild thin peripheral enhancement. This lesion is contiguous with both the pancreatic tail and the proximal stomach on the coronal images. The remainder of the pancreas appears normal. There is no pancreatic ductal dilatation or other surrounding fluid collection. Spleen: Normal in size without focal abnormality. Adrenals/Urinary Tract: Both adrenal glands appear normal. Both kidneys demonstrate cortical thinning and small cysts. 13 mm lesion in the upper pole of the left kidney demonstrates low T2 signal, but no enhancement following  contrast, consistent with a hemorrhagic cyst. Unfortunately, the 14 mm exophytic lesion involving the lower pole of the left kidney is not fully imaged in the axial plane and is incompletely characterized. This lesion also demonstrates low T2 signal, although has higher signal on the postcontrast coronal images (image 26/28). This signal is indeterminate for enhancement as the precontrast images do not encompass this lesion. Stomach/Bowel: No evidence of bowel wall thickening, distention or surrounding inflammatory change.As above, the cystic mass involving the pancreatic tail abuts the proximal stomach. Vascular/Lymphatic: There are no enlarged abdominal lymph nodes. Extensive  aortic and branch vessel atherosclerosis, better demonstrated on CT. Patient is status post aortobifemoral grafting. Other: Mild anasarca with mild diffuse soft tissue edema and a small amount of ascites. The edema appears greatest around the pancreas and gallbladder. Musculoskeletal: No acute or significant osseous findings. Schmorl's nodes are noted in the spine. IMPRESSION: 1. Study is moderately motion degraded, especially on the thin section MRCP images. No biliary or pancreatic ductal dilatation is demonstrated. 2. Previously demonstrated lesion involving the pancreatic tail remains indeterminate, largely cystic in nature, but with thin peripheral enhancement. This could reflect a postinflammatory pseudocyst, although given normal recent serum lipase levels, cystic pancreatic neoplasm or necrotic gastrointestinal stromal tumor of the stomach remain potential explanations. Follow-up CT or MRI in 3 months recommended. 3. Previously described left renal lesions are likely hemorrhagic cysts, although the lower pole lesion is incompletely visualized on the axial images. Attention on follow-up recommended. 4. Anasarca with generalized edema, ascites and bilateral pleural effusions. There is pericholecystic fluid without gallbladder wall thickening or abnormal enhancement. Electronically Signed   By: Richardean Sale M.D.   On: 03/09/2018 14:41    Scheduled Meds: . atorvastatin  20 mg Oral QHS  . carvedilol  25 mg Oral BID WC  . fenofibrate  54 mg Oral Daily  . gabapentin  300 mg Oral BID  . insulin aspart  0-9 Units Subcutaneous TID WC  . levothyroxine  100 mcg Oral QAC breakfast  . LORazepam  1 mg Intravenous Once  . multivitamin with minerals  1 tablet Oral Daily  . pantoprazole  40 mg Intravenous Q12H  . potassium chloride  40 mEq Oral Daily  . torsemide  60 mg Oral Daily  . vitamin B-12  500 mcg Oral Daily  . vitamin C  250 mg Oral BID  . zinc sulfate  220 mg Oral Daily   Continuous  Infusions:  Time spent: More than 50% of that time was spent in counseling and/or coordination of care.  [facility] Sybella Harnish  [facility] Pager [phone].   If 7PM-7AM, please contact night-coverage at [email or web address], password [facility] 03/10/2018, 12:54 PM

## 2018-03-10 NOTE — Anesthesia Postprocedure Evaluation (Signed)
Anesthesia Post Note  Patient: [name]  Procedure(s) Performed: ESOPHAGOGASTRODUODENOSCOPY (EGD) WITH PROPOFOL (N/A )     Patient location during evaluation: Endoscopy Anesthesia Type: MAC Level of consciousness: awake Pain management: pain level controlled Vital Signs Assessment: post-procedure vital signs reviewed and stable Respiratory status: spontaneous breathing Cardiovascular status: stable Postop Assessment: no apparent nausea or vomiting Anesthetic complications: no    Last Vitals:  Vitals:   03/10/18 1230 03/10/18 1240  BP: 119/70 124/69  Pulse: (!) 111 (!) 111  Resp: (!) 26 18  Temp:  36.7 C  SpO2: 96% 94%    Last Pain:  Vitals:   03/10/18 1240  TempSrc: Oral  PainSc:                  Makell Drohan

## 2018-03-10 NOTE — Interval H&P Note (Signed)
History and Physical Interval Note:  03/10/2018 11:07 AM  [name]  has presented today for surgery, with the diagnosis of melena  The various methods of treatment have been discussed with the patient and family. After consideration of risks, benefits and other options for treatment, the patient has consented to  Procedure(s): ESOPHAGOGASTRODUODENOSCOPY (EGD) WITH PROPOFOL (N/A) as a surgical intervention .  The patient's history has been reviewed, patient examined, no change in status, stable for surgery.  I have reviewed the patient's chart and labs.  Questions were answered to the patient's satisfaction.     Landry Dyke

## 2018-03-10 NOTE — Op Note (Signed)
[facility] Patient Name: [name] Procedure Date : 03/10/2018 MRN: [id number] Attending MD: Arta Silence , MD Date of Birth: 09-04-1940 CSN: [id number] Age: 77 Admit Type: Inpatient Procedure:                Upper GI endoscopy Indications:              Acute post hemorrhagic anemia, Melena Providers:                Arta Silence, MD, Cleda Daub, RN, Cletis Athens,                            Technician Referring MD:              Medicines:                Monitored Anesthesia Care Complications:            No immediate complications. Estimated Blood Loss:     Estimated blood loss: none. Procedure:                Pre-Anesthesia Assessment:                           - Prior to the procedure, a History and Physical                            was performed, and patient medications and                            allergies were reviewed. The patient's tolerance of                            previous anesthesia was also reviewed. The risks                            and benefits of the procedure and the sedation                            options and risks were discussed with the patient.                            All questions were answered, and informed consent                            was obtained. Prior Anticoagulants: The patient has                            taken Eliquis (apixaban), last dose was 3 days                            prior to procedure. ASA Grade Assessment: III - A                            patient with severe systemic disease. After  reviewing the risks and benefits, the patient was                            deemed in satisfactory condition to undergo the                            procedure.                           After obtaining informed consent, the endoscope was                            passed under direct vision. Throughout the                            procedure, the patient's blood pressure, pulse,  and                            oxygen saturations were monitored continuously. The                            [id number]-H190 ([id number]) Olympus adult EGD was introduced                            through the mouth, and advanced to the second part                            of duodenum. The upper GI endoscopy was                            accomplished without difficulty. The patient                            tolerated the procedure well. Scope In: Scope Out: Findings:      The examined esophagus was normal.      Patchy mild inflammation was found in the prepyloric region of the       stomach.      The exam of the stomach was otherwise normal.      Patchy mildly erythematous mucosa was found in the duodenal bulb.      The exam of the duodenum was otherwise normal.      No old or fresh blood was seen to the extent of our examination. Impression:               - Normal esophagus.                           - Gastritis.                           - Erythematous duodenopathy.                           - No discrete source bleeding; could be diffuse GI  tract oozing. Moderate Sedation:      None (MAC) Recommendation:           - Return patient to hospital ward for ongoing care.                           - Clear liquid diet today.                           - Continue present medications.                           - PPI (pantoprazole 40 mg po qd) indefinitely.                           - OK to resume apixaban from GI perspective.                           - If bleeding recurs, could consider capsule                            endoscopy as next step in management.                           [facility] will sign-off; we can arrange outpatient                            follow-up with [place]; please call with any questions;                            thank you for the consultation. Procedure Code(s):        --- Professional ---                           [phone],  Esophagogastroduodenoscopy, flexible,                            transoral; diagnostic, including collection of                            specimen(s) by brushing or washing, when performed                            (separate procedure) Diagnosis Code(s):        --- Professional ---                           K29.70, Gastritis, unspecified, without bleeding                           K31.89, Other diseases of stomach and duodenum                           D62, Acute posthemorrhagic anemia                           K92.1, Melena (includes  Hematochezia) CPT copyright 2018 [employer]. All rights reserved. The codes documented in this report are preliminary and upon coder review may  be revised to meet current compliance requirements. Arta Silence, MD 03/10/2018 12:14:49 PM This report has been signed electronically. Number of Addenda: 0

## 2018-03-10 NOTE — Transfer of Care (Signed)
Immediate Anesthesia Transfer of Care Note  Patient: [name]  Procedure(s) Performed: ESOPHAGOGASTRODUODENOSCOPY (EGD) WITH PROPOFOL (N/A )  Patient Location: Endoscopy Unit  Anesthesia Type:MAC  Level of Consciousness: awake and alert   Airway & Oxygen Therapy: Patient Spontanous Breathing and Patient connected to nasal cannula oxygen  Post-op Assessment: Report given to RN, Post -op Vital signs reviewed and stable and Patient moving all extremities  Post vital signs: Reviewed and stable  Last Vitals:  Vitals Value Taken Time  BP    Temp    Pulse    Resp 26 03/10/2018 12:18 PM  SpO2    Vitals shown include unvalidated device data.  Last Pain:  Vitals:   03/10/18 1108  TempSrc:   PainSc: 8       Patients Stated Pain Goal: 0 (74/25/95 [id number])  Complications: No apparent anesthesia complications

## 2018-03-10 NOTE — Anesthesia Preprocedure Evaluation (Signed)
Anesthesia Evaluation  Patient identified by MRN, date of birth, ID band Patient awake    Reviewed: Allergy & Precautions, NPO status , Patient's Chart, lab work & pertinent test results  History of Anesthesia Complications (+) Emergence Delirium  Airway Mallampati: II       Dental   Pulmonary shortness of breath, pneumonia, former smoker,    breath sounds clear to auscultation       Cardiovascular hypertension, + CAD, + Peripheral Vascular Disease and +CHF   Rhythm:Regular Rate:Normal     Neuro/Psych  Neuromuscular disease    GI/Hepatic GERD  ,  Endo/Other  diabetesHypothyroidism   Renal/GU Renal disease     Musculoskeletal  (+) Arthritis ,   Abdominal   Peds  Hematology  (+) anemia ,   Anesthesia Other Findings   Reproductive/Obstetrics                             Anesthesia Physical Anesthesia Plan  ASA: III  Anesthesia Plan: MAC   Post-op Pain Management:    Induction: Intravenous  PONV Risk Score and Plan: Treatment may vary due to age or medical condition  Airway Management Planned: Simple Face Mask and Nasal Cannula  Additional Equipment:   Intra-op Plan:   Post-operative Plan:   Informed Consent:   Dental advisory given  Plan Discussed with: CRNA and Anesthesiologist  Anesthesia Plan Comments:         Anesthesia Quick Evaluation

## 2018-03-10 NOTE — Progress Notes (Signed)
Recent GI bleed (presumed upper tract source), history of aspirin and Eliquis (currently off both), cystic lesion of pancreas, back pain.  Patient has been without bowel movements or other evidence of GI bleeding.  Hemoglobin has risen from 9.5 yesterday to 10.1 today, and during the same time, his BUN has dropped from 71 to 58.  MRI scan yesterday showed evidence of a cystic lesion in the tail of the pancreas.  He does not show evidence of acute pancreatitis, which is reinforced by his repeatedly normal lipase level.  Plan:  1.  Upper endoscopy this afternoon to try to determine whether he had ulcer disease from his aspirin, to help guide further decisions about aspirin and/or anticoagulation therapy going forward.  2.  I discussed options for follow-up of his pancreatic cystic lesion.  I explained that there is a slight chance that such a lesion could eventually turn cancerous, but that it does not appear to be cancer now.  Realistically, the probability of this turning cancers is very low, and the likelihood the patient could undergo any treatment if it were to turn cancer, taking into account his age and multiple comorbidities, it is also very low.  Accordingly, I do not feel that periodic radiographic surveillance of this lesion is necessary.  (Some small renal lesions, felt to be hemorrhagic cysts, were also noted, and I think the same comments apply.)  Therefore, I have recommended that further radiographic studies be reserved for actual development of bothersome abdominal symptoms, rather than just for routine surveillance, so I will not arrange for future follow-up studies at this time.  The patient, his wife, and his daughter seem to be agreeable to this approach.  3.  I have made an appointment for the patient to follow-up with me in the office on November 21 at 10:15 AM, to monitor hemoglobin and Hemoccult status and discern whether any change in GI therapy, or diagnostic evaluation of the  lower tract such as colonoscopy, is warranted (doubt).  Time at bedside approximately 20 minutes  Cleotis Nipper, M.D. Pager [phone] If no answer or after 5 PM call [phone]

## 2018-03-10 NOTE — Progress Notes (Signed)
Patient's MEWS score is a 2. Pt HR has been in the 100-110's. Pt's vitals are stable. Will continue to monitor.

## 2018-03-10 NOTE — Care Management Important Message (Signed)
Important Message  Patient Details  Name: [name] MRN: [id number] Date of Birth: Jun 26, 1940   Medicare Important Message Given:  Yes    Lyra Alaimo P Faven Watterson 03/10/2018, 11:43 AM

## 2018-03-11 ENCOUNTER — Encounter: Payer: Self-pay | Admitting: Vascular Surgery

## 2018-03-11 DIAGNOSIS — G8929 Other chronic pain: Secondary | ICD-10-CM

## 2018-03-11 DIAGNOSIS — N183 Chronic kidney disease, stage 3 (moderate): Secondary | ICD-10-CM

## 2018-03-11 DIAGNOSIS — M549 Dorsalgia, unspecified: Secondary | ICD-10-CM

## 2018-03-11 DIAGNOSIS — K922 Gastrointestinal hemorrhage, unspecified: Secondary | ICD-10-CM

## 2018-03-11 LAB — CBC
HCT: 28.9 % — ABNORMAL LOW (ref 39.0–52.0)
Hemoglobin: 9.1 g/dL — ABNORMAL LOW (ref 13.0–17.0)
MCH: 29.1 pg (ref 26.0–34.0)
MCHC: 31.5 g/dL (ref 30.0–36.0)
MCV: 92.3 fL (ref 80.0–100.0)
Platelets: 186 10*3/uL (ref 150–400)
RBC: 3.13 MIL/uL — ABNORMAL LOW (ref 4.22–5.81)
RDW: 17.3 % — ABNORMAL HIGH (ref 11.5–15.5)
WBC: 7.2 10*3/uL (ref 4.0–10.5)
nRBC: 0 % (ref 0.0–0.2)

## 2018-03-11 LAB — GLUCOSE, CAPILLARY
Glucose-Capillary: 124 mg/dL — ABNORMAL HIGH (ref 70–99)
Glucose-Capillary: 163 mg/dL — ABNORMAL HIGH (ref 70–99)

## 2018-03-11 LAB — BASIC METABOLIC PANEL WITH GFR
Anion gap: 11 (ref 5–15)
BUN: 48 mg/dL — ABNORMAL HIGH (ref 8–23)
CO2: 27 mmol/L (ref 22–32)
Calcium: 9 mg/dL (ref 8.9–10.3)
Chloride: 97 mmol/L — ABNORMAL LOW (ref 98–111)
Creatinine, Ser: 1.85 mg/dL — ABNORMAL HIGH (ref 0.61–1.24)
GFR calc Af Amer: 39 mL/min — ABNORMAL LOW
GFR calc non Af Amer: 33 mL/min — ABNORMAL LOW
Glucose, Bld: 138 mg/dL — ABNORMAL HIGH (ref 70–99)
Potassium: 3.9 mmol/L (ref 3.5–5.1)
Sodium: 135 mmol/L (ref 135–145)

## 2018-03-11 LAB — CANCER ANTIGEN 19-9: CAN 19-9: 40 U/mL — AB (ref 0–35)

## 2018-03-11 MED ORDER — PANTOPRAZOLE SODIUM 40 MG PO TBEC: 40.0000 mg | DELAYED_RELEASE_TABLET | Freq: Every day | ORAL | 1 refills | Status: AC

## 2018-03-11 NOTE — Discharge Summary (Signed)
Physician Discharge Summary  [name] NKN:[id number] DOB: 16-Jul-1940 DOA: 03/07/2018  PCP: Lavone Orn, MD  Admit date: 03/07/2018  Discharge date: 03/11/2018  Admitted From: Home  Disposition:  Home  Discharge Condition:Stable  CODE STATUS:  Full  Diet recommendation:  Heart Healthy   Brief/Interim Summary:  Patient is a 77 years old male with past medical history of diabetes mellitus, GERD, hypertension, hyperlipidemia, hemorrhoids, hypothyroidism presented to hospital with 3-day history of dark sticky stools.  Patient was on Eliquis for atrial fibrillation.  Patient was admitted for GI bleeding with  a hemoglobin of 8.8 on presentation. Received 1 unit of packed RBC with appropriate rise in hemoglobin.  Patient does have a history of lower extremity edema with blister and history of congestive heart failure as well.    Following conditions were addressed during hospitalization.  Upper GI bleeding.  Underwent a CT scan of the abdomen pelvis and MRI to rule out aortic enteric fistula.  MRI however showed some cystic lesion in the pancreas.   CA-19-9 was negative. He does have an appointment to follow Dr. Cristina Gong, GI on November 21.    Status post upper GI endoscopy on 03/10/2018  with no definite source of bleeding.  There was some mild gastritis.  GI did not recommend further evaluation and cleared the patient to restart on aspirin and Eliquis.  Of note he did not have any significant drop in his hemoglobin during hospitalization after transfusion of 1 unit..  Paroxysmal atrial fibrillation.    Aspirin and Eliquis will be restarted on discharge.  Symptomatic anemia secondary to acute blood loss improved after transfusion.    No upper GI source of bleeding.  Hemoglobin of 9.1.    GI has considered okay for discharge with anti-coagulation.  Mild back pain.  Negative lipase.  Symptomatic care.  AKI on chronic kidney disease.   Patient has been started on torsemide again.   Creatinine levels have been stable.  History of congestive heart failure with bilateral lower extremity edema and blisters. Continue on Coreg, Lipitor.    Resume home torsemide doses on discharge.  Continue local wound care for the legs and blisters.   Disposition.  At this time, patient has been considered stable for disposition home.  He does have home health services.  He also has an appointment with his primary care physician for next week.    Patient was extensively counseled regarding fluid restriction, salt low salt intake need for daily weights.  I had a prolonged discussion with the patient's wife at bedside regarding the disposition and CHF discussion.  Patient's wife stated that there is compliance issues with dietary regimen.  Discharge Diagnoses:  Principal Problem:   GI bleed Active Problems:   Diabetes mellitus type 2 with complications (HCC)   Chronic kidney disease, stage III (moderate) (HCC)   Benign essential HTN   Symptomatic anemia   Atrial fibrillation (HCC)   Hyperlipidemia   Chronic back pain   Combined systolic and diastolic heart failure [facility])   Discharge Instructions  Discharge Instructions    Diet - low sodium heart healthy   Complete by:  As directed    Discharge instructions   Complete by:  As directed    Follow up with your primary care physician as has been scheduled. Low salt, daily weights, no canned processed foods, fluid restriction 1539ml/day.   Increase activity slowly   Complete by:  As directed      Allergies as of 03/11/2018  Reactions   Amoxicillin-pot Clavulanate Nausea Only   Has patient had a PCN reaction causing immediate rash, facial/tongue/throat swelling, SOB or lightheadedness with hypotension: No Has patient had a PCN reaction causing severe rash involving mucus membranes or skin necrosis: No Has patient had a PCN reaction that required hospitalization: No Has patient had a PCN reaction occurring within the last 10 years:  No If all of the above answers are "NO", then may proceed with Cephalosporin use.   Morphine And Related Other (See Comments)   hallucinations   Percocet [oxycodone-acetaminophen] Other (See Comments)   Agitation and Increased Confusion. Family asks to please not order this medication for him.    Restoril [temazepam] Other (See Comments)   Confusion and Agitation. Has taken in past, but has not reacted well. Family requests to not give this mediation.       Medication List    TAKE these medications   acetaminophen 500 MG tablet Commonly known as:  TYLENOL Take 1,000 mg by mouth every 6 (six) hours as needed for headache (pain).   apixaban 5 MG Tabs tablet Commonly known as:  ELIQUIS Take 1 tablet (5 mg total) by mouth 2 (two) times daily.   aspirin EC 81 MG tablet Take 1 tablet (81 mg total) by mouth daily.   atorvastatin 20 MG tablet Commonly known as:  LIPITOR Take 20 mg by mouth at bedtime.   carvedilol 25 MG tablet Commonly known as:  COREG Take 1 tablet (25 mg total) by mouth 2 (two) times daily.   fenofibrate 54 MG tablet Take 1 tablet (54 mg total) by mouth daily.   gabapentin 300 MG capsule Commonly known as:  NEURONTIN Take 300 mg by mouth 2 (two) times daily.   levothyroxine 100 MCG tablet Commonly known as:  SYNTHROID, LEVOTHROID Take 100 mcg by mouth daily before breakfast.   methylcellulose 1 % ophthalmic solution Commonly known as:  ARTIFICIAL TEARS Place 1 drop into both eyes 2 (two) times daily as needed (dry eyes).   metolazone 5 MG tablet Commonly known as:  ZAROXOLYN Take 5 mg by mouth See admin instructions. Take one tablet (5 mg) by mouth when directed to do so by cardiologist.   multivitamin with minerals Tabs tablet Take 1 tablet by mouth daily.   nitroGLYCERIN 0.4 MG SL tablet Commonly known as:  NITROSTAT PLACE 1 TABLET UNDER TONGUE EVERY 5 MINUTES AS NEEDED FOR CHEST PAIN. What changed:  See the new instructions.   pantoprazole 40  MG tablet Commonly known as:  PROTONIX Take 1 tablet (40 mg total) by mouth daily.   pioglitazone 30 MG tablet Commonly known as:  ACTOS Take 1 tablet (30 mg total) by mouth daily with lunch.   pseudoephedrine-guaifenesin 60-600 MG 12 hr tablet Commonly known as:  MUCINEX D Take 1 tablet by mouth every 12 (twelve) hours as needed for congestion.   sodium chloride 0.65 % Soln nasal spray Commonly known as:  OCEAN Place 1 spray into both nostrils 2 (two) times daily as needed for congestion.   tamsulosin 0.4 MG Caps capsule Commonly known as:  FLOMAX Take 0.4 mg by mouth every other day.   temazepam 15 MG capsule Commonly known as:  RESTORIL Take 15 mg by mouth at bedtime as needed for sleep.   torsemide 20 MG tablet Commonly known as:  DEMADEX Take 3 tablets (60 mg total) by mouth 2 (two) times daily.   VICKS SINEX 0.05 % nasal spray Generic drug:  oxymetazoline Place 1  spray into both nostrils 2 (two) times daily as needed for congestion.   vitamin B-12 500 MCG tablet Commonly known as:  CYANOCOBALAMIN Take 500 mcg by mouth daily.   VITAMIN C PO Take 1 tablet by mouth 2 (two) times daily.   zinc gluconate 50 MG tablet Take 50 mg by mouth daily.       Allergies  Allergen Reactions  . Amoxicillin-Pot Clavulanate Nausea Only    Has patient had a PCN reaction causing immediate rash, facial/tongue/throat swelling, SOB or lightheadedness with hypotension: No Has patient had a PCN reaction causing severe rash involving mucus membranes or skin necrosis: No Has patient had a PCN reaction that required hospitalization: No Has patient had a PCN reaction occurring within the last 10 years: No If all of the above answers are "NO", then may proceed with Cephalosporin use.   Marland Kitchen Morphine And Related Other (See Comments)    hallucinations  . Percocet [Oxycodone-Acetaminophen] Other (See Comments)    Agitation and Increased Confusion. Family asks to please not order this  medication for him.   . Restoril [Temazepam] Other (See Comments)    Confusion and Agitation. Has taken in past, but has not reacted well. Family requests to not give this mediation.     Consultations:  GI   Procedures/Studies:  EGD on 03/10/2018 PRBC transfusion 1 unit  Ct Abdomen Pelvis Wo Contrast  Result Date: 03/08/2018 CLINICAL DATA:  77 year old male with history of diffuse abdominal pain and renal insufficiency. EXAM: CT ABDOMEN AND PELVIS WITHOUT CONTRAST TECHNIQUE: Multidetector CT imaging of the abdomen and pelvis was performed following the standard protocol without IV contrast. COMPARISON:  CT the abdomen and pelvis 09/22/2007. FINDINGS: Lower chest: Cardiomegaly. Mild calcifications of the aortic valve. Atherosclerotic calcifications in the descending thoracic aorta, as well as the left anterior descending, left circumflex and right coronary arteries. Trace bilateral pleural effusions lying dependently. Patchy areas of mild ground-glass attenuation and interlobular septal thickening in the visualize lung bases, suggesting a background of very mild interstitial pulmonary edema. Hepatobiliary: No definite cystic or solid hepatic lesions are confidently identified on today's noncontrast CT examination. Gallbladder is only moderately distended. There is a small amount of haziness adjacent to the gallbladder, which may reflect some edema in the gallbladder wall. No overt surrounding inflammatory changes. No definite calcified gallstones are identified. Pancreas: Adjacent to the tail of the pancreas there are subtle inflammatory changes. Extending cephalad from the tail of the pancreas there is a 4.1 x 2.9 x 3.7 cm centrally low-attenuation lesion (axial image 23 of series 3 and coronal image 65 of series 6) with some peripheral soft tissue thickening, intimately associated with the proximal stomach, not characterized on today's noncontrast CT examination, but favored to represent a  pancreatic pseudocyst. Spleen: Unremarkable. Adrenals/Urinary Tract: Mild atrophy of both kidneys. In the upper pole of the right kidney there is a 1.5 cm high attenuation lesion (axial image 27 of series 3) which is incompletely characterized on today's noncontrast CT examination. Likewise there is a 9 mm high attenuation lesion in the lower pole the left kidney which is also not characterized. Extrarenal pelvis on the left (normal anatomical variant) again noted. No hydroureteronephrosis. Urinary bladder is normal in appearance. Bilateral adrenal glands are normal in appearance. Stomach/Bowel: As mentioned above, the lesion in the tail of the pancreas is intimately associated with the proximal stomach along the under surface and greater curvature. Distal aspect of the stomach is otherwise unremarkable. No pathologic dilatation of small bowel  or colon. Normal appendix. Vascular/Lymphatic: Aortic atherosclerosis. Postoperative changes of graft replacement of the common and external iliac arteries bilaterally. No lymphadenopathy noted in the abdomen or pelvis. Reproductive: Prostate gland and seminal vesicles are unremarkable in appearance. Other: Trace volume of perihepatic ascites.  No pneumoperitoneum. Musculoskeletal: There are no aggressive appearing lytic or blastic lesions noted in the visualized portions of the skeleton. IMPRESSION: 1. Subtle inflammatory changes adjacent to the tail of the pancreas where there is also a lesion extending cephalad in contact with the proximal stomach. These findings likely reflect pancreatitis with a small pancreatic pseudocyst, however, further characterization with MRI of the abdomen with and without IV gadolinium with MRCP is strongly recommended in the near future to exclude the possibility of pancreatic neoplasm. 2. Subtle haziness adjacent to the gallbladder favored to reflect some gallbladder wall edema. No calcified cholelithiasis. Gallbladder is not distended, and  there are no overt surrounding inflammatory changes to strongly suggest an acute cholecystitis at this time. Attention at time of forthcoming MRI examination is recommended. 3. Trace volume of perihepatic ascites. 4. Two small high attenuation lesions in the left kidney. These are favored to represent small proteinaceous/hemorrhagic cysts, but are incompletely characterized on today's noncontrast CT examination. Attention at time of forthcoming MRI of the abdomen is recommended for definitive characterization. 5. There are calcifications of the aortic valve. Echocardiographic correlation for evaluation of potential valvular dysfunction may be warranted if clinically indicated. 6. Aortic atherosclerosis, in addition to at least 3 vessel coronary artery disease. Assessment for potential risk factor modification, dietary therapy or pharmacologic therapy may be warranted, if clinically indicated. 7. There are calcifications of the aortic valve. Echocardiographic correlation for evaluation of potential valvular dysfunction may be warranted if clinically indicated. 8. Cardiomegaly with evidence of mild interstitial pulmonary edema in the visualize lung bases, and trace bilateral pleural effusions; findings suggestive of congestive heart failure. Electronically Signed   By: Vinnie Langton M.D.   On: 03/08/2018 11:21   Dg Chest 2 View  Result Date: 02/25/2018 CLINICAL DATA:  Swelling both extremities.  Prior vascular surgery. EXAM: CHEST - 2 VIEW COMPARISON:  10/11/2017. FINDINGS: Prior CABG. Cardiomegaly with mild bilateral interstitial prominence consistent CHF. No focal infiltrate. No pneumothorax. IMPRESSION: Prior CABG. Cardiomegaly with diffuse bilateral interstitial prominence consistent with CHF. Electronically Signed   By: Marcello Moores  Register   On: 02/25/2018 11:41   Dg Chest Port 1 View  Result Date: 02/28/2018 CLINICAL DATA:  CHF, shortness of breath. EXAM: PORTABLE CHEST 1 VIEW COMPARISON:  PA and lateral  chest x-ray of February 25, 2018 FINDINGS: The lungs are adequately inflated. The interstitial markings are coarse. The cardiac silhouette is enlarged. The pulmonary vascularity is engorged. The patient has undergone previous median sternotomy and CABG. There is calcification in the wall of the aortic arch. There is no significant pleural effusion and no pneumothorax. IMPRESSION: CHF with mild interstitial edema which when compared to the previous study which has worsened slightly since the previous study. This is accentuated by the AP portable technique. No alveolar pneumonia nor pleural effusion. Thoracic aortic atherosclerosis. Electronically Signed   By: David  Martinique M.D.   On: 02/28/2018 07:25   Mr Abdomen Mrcp Moise Boring Contast  Result Date: 03/09/2018 CLINICAL DATA:  Abdominal pain, dark stools and pancreatitis. History of diabetes, hypertension and renal insufficiency. EXAM: MRI ABDOMEN WITHOUT AND WITH CONTRAST (INCLUDING MRCP) TECHNIQUE: Multiplanar multisequence MR imaging of the abdomen was performed both before and after the administration of intravenous contrast. Heavily  T2-weighted images of the biliary and pancreatic ducts were obtained, and three-dimensional MRCP images were rendered by post processing. CONTRAST:  10 cc Gadavist COMPARISON:  Abdominopelvic CT 03/08/2018. FINDINGS: Despite efforts by the technologist and patient, mild motion artifact is present on today's exam and could not be eliminated. This reduces exam sensitivity and specificity. The thin section MRCP images are essentially nondiagnostic. Lower chest: Small dependent bilateral pleural effusions. The heart is enlarged status post median sternotomy. Hepatobiliary: The liver demonstrates mild periportal edema, but no significant steatosis, focal lesion or abnormal enhancement. There is some pericholecystic fluid, but no gallbladder wall thickening, abnormal enhancement or biliary dilatation. As above, the thin section dedicated MRCP  images are limited. Pancreas: As demonstrated on recent CT, there is a cystic mass associated with the pancreatic tail. This measures 3.2 x 2.7 x 3.1 cm and demonstrates central high T2 signal and mild thin peripheral enhancement. This lesion is contiguous with both the pancreatic tail and the proximal stomach on the coronal images. The remainder of the pancreas appears normal. There is no pancreatic ductal dilatation or other surrounding fluid collection. Spleen: Normal in size without focal abnormality. Adrenals/Urinary Tract: Both adrenal glands appear normal. Both kidneys demonstrate cortical thinning and small cysts. 13 mm lesion in the upper pole of the left kidney demonstrates low T2 signal, but no enhancement following contrast, consistent with a hemorrhagic cyst. Unfortunately, the 14 mm exophytic lesion involving the lower pole of the left kidney is not fully imaged in the axial plane and is incompletely characterized. This lesion also demonstrates low T2 signal, although has higher signal on the postcontrast coronal images (image 26/28). This signal is indeterminate for enhancement as the precontrast images do not encompass this lesion. Stomach/Bowel: No evidence of bowel wall thickening, distention or surrounding inflammatory change.As above, the cystic mass involving the pancreatic tail abuts the proximal stomach. Vascular/Lymphatic: There are no enlarged abdominal lymph nodes. Extensive aortic and branch vessel atherosclerosis, better demonstrated on CT. Patient is status post aortobifemoral grafting. Other: Mild anasarca with mild diffuse soft tissue edema and a small amount of ascites. The edema appears greatest around the pancreas and gallbladder. Musculoskeletal: No acute or significant osseous findings. Schmorl's nodes are noted in the spine. IMPRESSION: 1. Study is moderately motion degraded, especially on the thin section MRCP images. No biliary or pancreatic ductal dilatation is demonstrated.  2. Previously demonstrated lesion involving the pancreatic tail remains indeterminate, largely cystic in nature, but with thin peripheral enhancement. This could reflect a postinflammatory pseudocyst, although given normal recent serum lipase levels, cystic pancreatic neoplasm or necrotic gastrointestinal stromal tumor of the stomach remain potential explanations. Follow-up CT or MRI in 3 months recommended. 3. Previously described left renal lesions are likely hemorrhagic cysts, although the lower pole lesion is incompletely visualized on the axial images. Attention on follow-up recommended. 4. Anasarca with generalized edema, ascites and bilateral pleural effusions. There is pericholecystic fluid without gallbladder wall thickening or abnormal enhancement. Electronically Signed   By: Richardean Sale M.D.   On: 03/09/2018 14:41      Subjective: Patient denies any shortness of breath, chest pain palpitation.  Lying flat in bed.  Has improved leg swelling.  Discharge Exam: Vitals:   03/11/18 0337 03/11/18 0817  BP: 116/65 125/68  Pulse: (!) 120 (!) 110  Resp: 19 18  Temp: 97.8 F (36.6 C)   SpO2: 99% 95%   Vitals:   03/10/18 2006 03/11/18 0322 03/11/18 0337 03/11/18 0817  BP: 131/72  116/65 125/68  Pulse: 97  (!) 120 (!) 110  Resp: (!) 25  19 18   Temp: 97.6 F (36.4 C)  97.8 F (36.6 C)   TempSrc: Oral  Oral   SpO2: 100%  99% 95%  Weight:  114.4 kg    Height:        General: Pt is alert, awake, not in acute distress Cardiovascular: RRR, S1/S2 +, no rubs, no gallops Respiratory: CTA bilaterally, no wheezing, no rhonchi Abdominal: Soft, NT, ND, bowel sounds + CNS: non focal. Extremities: Bilateral lower extremity edema improved.  Blisters status post rupture with dressing.  The results of significant diagnostics from this hospitalization (including imaging, microbiology, ancillary and laboratory) are listed below for reference.    Microbiology: No results found for this or any  previous visit (from the past 240 hour(s)).   Labs: BNP (last 3 results) Recent Labs    02/25/18 1340  BNP 8,546.2*   Basic Metabolic Panel: Recent Labs  Lab 03/07/18 1832 03/08/18 0759 03/09/18 0353 03/10/18 0455 03/11/18 0425  NA 134* 135 136 134* 135  K 4.0 3.4* 3.6 3.9 3.9  CL 95* 98 98 97* 97*  CO2 30 29 29 27 27   GLUCOSE 191* 181* 140* 185* 138*  BUN 95* 85* 71* 58* 48*  CREATININE 2.13* 1.96* 1.83* 1.88* 1.85*  CALCIUM 8.7* 8.7* 8.6* 8.9 9.0   Liver Function Tests: Recent Labs  Lab 03/07/18 1832  AST 32  ALT 12  ALKPHOS 27*  BILITOT 0.6  PROT 6.2*  ALBUMIN 3.0*   Recent Labs  Lab 03/09/18 0353 03/10/18 0455  LIPASE 38 39   No results for input(s): AMMONIA in the last 168 hours. CBC: Recent Labs  Lab 03/07/18 1832 03/08/18 0759 03/09/18 0353 03/10/18 0455 03/11/18 0425  WBC 6.3 5.5 3.9* 9.0 7.2  NEUTROABS 4.5  --   --   --   --   HGB 8.8* 9.6* 9.5* 10.1* 9.1*  HCT 29.1* 31.1* 30.0* 32.2* 28.9*  MCV 93.3 91.5 91.7 92.8 92.3  PLT 206 220 189 218 186   Cardiac Enzymes: No results for input(s): CKTOTAL, CKMB, CKMBINDEX, TROPONINI in the last 168 hours. BNP: Invalid input(s): POCBNP CBG: Recent Labs  Lab 03/10/18 0746 03/10/18 1120 03/10/18 1614 03/10/18 2120 03/11/18 0742  GLUCAP 174* 145* 195* 130* 124*   D-Dimer No results for input(s): DDIMER in the last 72 hours. Hgb A1c No results for input(s): HGBA1C in the last 72 hours. Lipid Profile No results for input(s): CHOL, HDL, LDLCALC, TRIG, CHOLHDL, LDLDIRECT in the last 72 hours. Thyroid function studies No results for input(s): TSH, T4TOTAL, T3FREE, THYROIDAB in the last 72 hours.  Invalid input(s): FREET3 Anemia work up No results for input(s): VITAMINB12, FOLATE, FERRITIN, TIBC, IRON, RETICCTPCT in the last 72 hours. Urinalysis    Component Value Date/Time   COLORURINE YELLOW 11/15/2017 1004   APPEARANCEUR CLEAR 11/15/2017 1004   LABSPEC 1.008 11/15/2017 1004   PHURINE  6.0 11/15/2017 1004   GLUCOSEU NEGATIVE 11/15/2017 1004   HGBUR MODERATE (A) 11/15/2017 1004   BILIRUBINUR NEGATIVE 11/15/2017 1004   KETONESUR NEGATIVE 11/15/2017 1004   PROTEINUR NEGATIVE 11/15/2017 1004   UROBILINOGEN 4.0 (H) 09/23/2011 1335   NITRITE NEGATIVE 11/15/2017 1004   LEUKOCYTESUR SMALL (A) 11/15/2017 1004   Sepsis Labs Invalid input(s): PROCALCITONIN,  WBC,  LACTICIDVEN Microbiology No results found for this or any previous visit (from the past 240 hour(s)).  Please note: You were cared for by a hospitalist during your hospital stay. Once you  are discharged, your primary care physician will handle any further medical issues.   Time coordinating discharge: 40 minutes  SIGNED:  Flora Lipps, MD  [facility] 03/11/2018, 11:52 AM

## 2018-03-11 NOTE — Plan of Care (Signed)
  Problem: Clinical Measurements: °Goal: Ability to maintain clinical measurements within normal limits will improve °Outcome: Progressing °  °Problem: Clinical Measurements: °Goal: Diagnostic test results will improve °Outcome: Progressing °  °Problem: Nutrition: °Goal: Adequate nutrition will be maintained °Outcome: Progressing °  °

## 2018-03-11 NOTE — Progress Notes (Signed)
Patient discharged: Home with family  Via: Wheelchair   Discharge paperwork given: to patient and family  Reviewed with teach back  IV and telemetry disconnected  Belongings given to patient    

## 2018-03-11 NOTE — Progress Notes (Addendum)
Notified pharmacy for new order CHF patient education pharmacy order.

## 2018-03-11 NOTE — Progress Notes (Signed)
Endoscopic findings from yesterday noted.  No specific point source of bleeding identified.  I agree with Dr. Paulita Fujita that it is appropriate to resume anticoagulation (I would be comfortable with both aspirin and Eliquis as he was on prior to admission).  No bowel movements or clinical evidence of bleeding.  Slight drop in hemoglobin over the past 24 hours from 9.5 to a current level of 9.1, but during the same time, his BUN dropped from 58 to 48, making upper GI bleeding very unlikely.  Have advanced patient to a heart healthy diet.  I believe he is ready to go home at any time from the GI standpoint, although I understand there are other issues, such as heart rate and peripheral edema, which might delay his discharge.  When he does go home, I would send the patient home on once daily PPI.  As noted previously, he is scheduled to see me for follow-up in the office on November 21.  He should probably be seen in the interim by his primary physician to check a hemoglobin.  Concerning his pancreatic cystic lesion, it is noted that his CA-19-9 level came back just minimally elevated, which I do not feel is clinically significant.  We will sign off.  Please call us if any questions arise.  [name], M.D. Pager [phone] If no answer or after 5 PM call [phone]

## 2018-03-11 NOTE — Progress Notes (Signed)
Provided patient and family with CHF education booklet  Dorella Laster, RN

## 2018-03-15 ENCOUNTER — Encounter: Payer: Self-pay | Admitting: Physician Assistant

## 2018-03-15 ENCOUNTER — Ambulatory Visit (INDEPENDENT_AMBULATORY_CARE_PROVIDER_SITE_OTHER): Payer: Medicare HMO | Admitting: Physician Assistant

## 2018-03-15 VITALS — BP 117/72 | HR 114 | Temp 98.4°F | Resp 16 | Ht 71.0 in | Wt 262.0 lb

## 2018-03-15 DIAGNOSIS — I130 Hypertensive heart and chronic kidney disease with heart failure and stage 1 through stage 4 chronic kidney disease, or unspecified chronic kidney disease: Secondary | ICD-10-CM | POA: Diagnosis not present

## 2018-03-15 DIAGNOSIS — N183 Chronic kidney disease, stage 3 (moderate): Secondary | ICD-10-CM | POA: Diagnosis not present

## 2018-03-15 DIAGNOSIS — E1122 Type 2 diabetes mellitus with diabetic chronic kidney disease: Secondary | ICD-10-CM | POA: Diagnosis not present

## 2018-03-15 DIAGNOSIS — E11621 Type 2 diabetes mellitus with foot ulcer: Secondary | ICD-10-CM | POA: Diagnosis not present

## 2018-03-15 DIAGNOSIS — D649 Anemia, unspecified: Secondary | ICD-10-CM | POA: Diagnosis not present

## 2018-03-15 DIAGNOSIS — L97529 Non-pressure chronic ulcer of other part of left foot with unspecified severity: Secondary | ICD-10-CM | POA: Diagnosis not present

## 2018-03-15 DIAGNOSIS — I5033 Acute on chronic diastolic (congestive) heart failure: Secondary | ICD-10-CM | POA: Diagnosis not present

## 2018-03-15 DIAGNOSIS — I739 Peripheral vascular disease, unspecified: Secondary | ICD-10-CM

## 2018-03-15 DIAGNOSIS — I5030 Unspecified diastolic (congestive) heart failure: Secondary | ICD-10-CM | POA: Diagnosis not present

## 2018-03-15 DIAGNOSIS — T8149XA Infection following a procedure, other surgical site, initial encounter: Secondary | ICD-10-CM | POA: Diagnosis not present

## 2018-03-15 DIAGNOSIS — I5022 Chronic systolic (congestive) heart failure: Secondary | ICD-10-CM | POA: Diagnosis not present

## 2018-03-15 DIAGNOSIS — I4891 Unspecified atrial fibrillation: Secondary | ICD-10-CM | POA: Diagnosis not present

## 2018-03-15 DIAGNOSIS — K922 Gastrointestinal hemorrhage, unspecified: Secondary | ICD-10-CM | POA: Diagnosis not present

## 2018-03-15 NOTE — Progress Notes (Signed)
Established Previous Bypass   History of Present Illness   [name] is a 77 y.o. ([birth year]) male who returns to clinic for reevaluation of left great toe discoloration.  Surgical history significant for aortobifemoral bypass in 2007 by Dr. Amedeo Plenty.  Patient also had a left femoral endarterectomy with femoral to below the knee popliteal bypass with vein by Dr. Oneida Alar in June 2019.  Postoperatively he had a complex recovery due to trouble healing left groin incision which required surgical intervention as well as wound VAC therapy and IV antibiotics.  He was recently seen in the hospital this month with left abdomen and groin cellulitis which resolved with IV antibiotics.  Left groin incision has one very small open area remaining without surrounding redness or drainage.  He was seen by his GI physician today who was concerned about a discolored left great toe.  [name] denies any rest pain.  He has been wearing his diabetic shoes.  He has had some weeping and blistering due to left lower extremity edema however combats this with elevation and sometimes compression.  Per the patient's [name] she believes the discolored area of left great toe tip has increased in size.  He is taking an aspirin and statin daily.  Graft surveillance duplex performed earlier this month demonstrated a possible inflow stenosis.  Current Outpatient Medications  Medication Sig Dispense Refill  . acetaminophen (TYLENOL) 500 MG tablet Take 1,000 mg by mouth every 6 (six) hours as needed for headache (pain).    Marland Kitchen apixaban (ELIQUIS) 5 MG TABS tablet Take 1 tablet (5 mg total) by mouth 2 (two) times daily. 60 tablet 0  . Ascorbic Acid (VITAMIN C PO) Take 1 tablet by mouth 2 (two) times daily.    Marland Kitchen aspirin EC 81 MG tablet Take 1 tablet (81 mg total) by mouth daily. 90 tablet 3  . atorvastatin (LIPITOR) 20 MG tablet Take 20 mg by mouth at bedtime.     . carvedilol (COREG) 25 MG tablet Take 1 tablet (25 mg total) by mouth  2 (two) times daily. 60 tablet 0  . fenofibrate 54 MG tablet Take 1 tablet (54 mg total) by mouth daily. 30 tablet 0  . gabapentin (NEURONTIN) 300 MG capsule Take 300 mg by mouth 2 (two) times daily.    Marland Kitchen levothyroxine (SYNTHROID, LEVOTHROID) 100 MCG tablet Take 100 mcg by mouth daily before breakfast.   3  . methylcellulose (ARTIFICIAL TEARS) 1 % ophthalmic solution Place 1 drop into both eyes 2 (two) times daily as needed (dry eyes).    . metolazone (ZAROXOLYN) 5 MG tablet Take 5 mg by mouth See admin instructions. Take one tablet (5 mg) by mouth when directed to do so by cardiologist.    . Multiple Vitamin (MULTIVITAMIN WITH MINERALS) TABS tablet Take 1 tablet by mouth daily.    . nitroGLYCERIN (NITROSTAT) 0.4 MG SL tablet PLACE 1 TABLET UNDER TONGUE EVERY 5 MINUTES AS NEEDED FOR CHEST PAIN. (Patient taking differently: Place 0.4 mg under the tongue every 5 (five) minutes as needed for chest pain. ) 75 tablet 1  . oxymetazoline (VICKS SINEX) 0.05 % nasal spray Place 1 spray into both nostrils 2 (two) times daily as needed for congestion.    . pantoprazole (PROTONIX) 40 MG tablet Take 1 tablet (40 mg total) by mouth daily. 30 tablet 1  . pioglitazone (ACTOS) 30 MG tablet Take 1 tablet (30 mg total) by mouth daily with lunch.    . pseudoephedrine-guaifenesin [facility]  D) 60-600 MG per tablet Take 1 tablet by mouth every 12 (twelve) hours as needed for congestion.     . sodium chloride (OCEAN) 0.65 % SOLN nasal spray Place 1 spray into both nostrils 2 (two) times daily as needed for congestion.    . tamsulosin (FLOMAX) 0.4 MG CAPS capsule Take 0.4 mg by mouth every other day.    . temazepam (RESTORIL) 15 MG capsule Take 15 mg by mouth at bedtime as needed for sleep.     Marland Kitchen torsemide (DEMADEX) 20 MG tablet Take 3 tablets (60 mg total) by mouth 2 (two) times daily. 180 tablet 3  . vitamin B-12 (CYANOCOBALAMIN) 500 MCG tablet Take 500 mcg by mouth daily.    Marland Kitchen zinc gluconate 50 MG tablet Take 50 mg by  mouth daily.     No current facility-administered medications for this visit.     On ROS today: 10 system ROS is negative unless otherwise noted in HPI   Physical Examination   Vitals:   03/15/18 1557  BP: 117/72  Pulse: (!) 114  Resp: 16  Temp: 98.4 F (36.9 C)  TempSrc: Oral  SpO2: 92%  Weight: 262 lb (118.8 kg)  Height: 5\' 11"  (1.803 m)   Body mass index is 36.54 kg/m.  General Alert, O x 3, WD, NAD  Pulmonary Sym exp, good B air movt,  Cardiac RRR, Nl S1, S2,   Vascular Vessel Right Left  Radial Palpable Palpable  PT Not palpable Not palpable  DP Not palpable Palpable    Musculo- skeletal M/S 5/5 throughout  , Extremities without ischemic changes  , Pitting edema present: BLE to the level of the knee, No visible varicosities , No Lipodermatosclerosis present; small superficial open area left groin without drainage or other sign of infection; left great toe with well demarcated necrotic tip with overlying callused ulceration without drainage with manipulation  Neurologic Pain and light touch intact in extremities , Motor exam as listed above           Medical Decision Making   [name] is a 77 y.o. male with healing L GT   Despite previously documented inflow stenosis, left femoropopliteal bypass is patent given palpable left anterior tibial artery pulse  Cellulitis of lower abdomen and left groin has resolved after inpatient stay with IV antibiotics  Left great toe tip appears to be healing well; remaining gangrenous toe tip will likely resolve on its own as long as bypass remains patent; no current indication to revascularize inflow stenosis  There is no redness, drainage, or other sign to suggest acute process of left great toe  Patient will follow-up to see Dr. Oneida Alar in several weeks to recheck groin and toe; this management plan will be discussed with Dr. Oneida Alar and the patient will be contacted with any changes to this plan   Dagoberto Ligas PA-C [facility] Office: [phone]

## 2018-03-16 ENCOUNTER — Ambulatory Visit: Payer: Medicare HMO | Admitting: Interventional Cardiology

## 2018-03-16 ENCOUNTER — Ambulatory Visit: Payer: Medicare HMO

## 2018-03-17 ENCOUNTER — Encounter: Payer: Self-pay | Admitting: Cardiology

## 2018-03-17 ENCOUNTER — Encounter: Payer: Self-pay | Admitting: Internal Medicine

## 2018-03-17 ENCOUNTER — Ambulatory Visit (INDEPENDENT_AMBULATORY_CARE_PROVIDER_SITE_OTHER): Payer: Medicare HMO | Admitting: Cardiology

## 2018-03-17 ENCOUNTER — Ambulatory Visit: Payer: Medicare HMO

## 2018-03-17 VITALS — BP 130/80 | HR 112 | Ht 71.0 in | Wt 260.1 lb

## 2018-03-17 DIAGNOSIS — I739 Peripheral vascular disease, unspecified: Secondary | ICD-10-CM | POA: Diagnosis not present

## 2018-03-17 DIAGNOSIS — I5043 Acute on chronic combined systolic (congestive) and diastolic (congestive) heart failure: Secondary | ICD-10-CM

## 2018-03-17 DIAGNOSIS — E118 Type 2 diabetes mellitus with unspecified complications: Secondary | ICD-10-CM

## 2018-03-17 DIAGNOSIS — I4819 Other persistent atrial fibrillation: Secondary | ICD-10-CM | POA: Diagnosis not present

## 2018-03-17 DIAGNOSIS — Z794 Long term (current) use of insulin: Secondary | ICD-10-CM

## 2018-03-17 DIAGNOSIS — Z951 Presence of aortocoronary bypass graft: Secondary | ICD-10-CM | POA: Diagnosis not present

## 2018-03-17 DIAGNOSIS — I130 Hypertensive heart and chronic kidney disease with heart failure and stage 1 through stage 4 chronic kidney disease, or unspecified chronic kidney disease: Secondary | ICD-10-CM | POA: Diagnosis not present

## 2018-03-17 DIAGNOSIS — I5033 Acute on chronic diastolic (congestive) heart failure: Secondary | ICD-10-CM | POA: Diagnosis not present

## 2018-03-17 DIAGNOSIS — I4891 Unspecified atrial fibrillation: Secondary | ICD-10-CM | POA: Diagnosis not present

## 2018-03-17 DIAGNOSIS — E1122 Type 2 diabetes mellitus with diabetic chronic kidney disease: Secondary | ICD-10-CM | POA: Diagnosis not present

## 2018-03-17 DIAGNOSIS — I5022 Chronic systolic (congestive) heart failure: Secondary | ICD-10-CM | POA: Diagnosis not present

## 2018-03-17 DIAGNOSIS — R000 Tachycardia, unspecified: Secondary | ICD-10-CM | POA: Diagnosis not present

## 2018-03-17 DIAGNOSIS — I10 Essential (primary) hypertension: Secondary | ICD-10-CM | POA: Diagnosis not present

## 2018-03-17 DIAGNOSIS — N183 Chronic kidney disease, stage 3 (moderate): Secondary | ICD-10-CM | POA: Diagnosis not present

## 2018-03-17 MED ORDER — AMIODARONE HCL 200 MG PO TABS: 200.0000 mg | ORAL_TABLET | Freq: Two times a day (BID) | ORAL | 3 refills | Status: DC

## 2018-03-17 NOTE — Progress Notes (Addendum)
The patient has been seen in conjunction with Cecilie Kicks, NP. All aspects of care have been considered and discussed. The patient has been personally interviewed, examined, and all clinical data has been reviewed.   Mr. Eslick is well-known to me and has multiple comorbidities including chronic combined systolic and diastolic heart failure, newly developed atrial fibrillation, recent GI bleeding, severe PAD, mild memory impairment, CAD with prior coronary grafting and recent non-ST elevation MI (suspect bypass graft occlusion), hypertension, type 2 diabetes, CKD III-IV, and recent lower extremity cellulitis from superinfection of weeping lower extremities related to heart failure.  Significant time was spent with Mr. Golson and his wife today.  The home diary of weights since hospital discharge was closely reviewed.  During hospitalization in October, he has added approximately 3 to 5 pounds to the dry weight that was established at discharge.  Weight has been stable.  He denies dyspnea, chest pain, chills, and fever.  On exam today, he denies dyspnea.  He does have bilateral lower extremity edema with erythema.  There is moderate neck vein distention with the patient sitting at 90 degrees (on the commode; evaluation was done while he was in the bathroom).  Lungs are clear.  EKG confirms atrial fibrillation with poor ventricular response.  The patient has an acute component on top of chronic combined systolic and diastolic heart failure.  At this point I believe atrial fibrillation is contributing and preventing stabilization of volume status.  He is somewhat tenuous but both he and his wife feel that he is still doing much better than when initially admitted 3 to 4 weeks ago with anasarca.  Therefore, we have added amiodarone 200 mg twice daily.  He will need to be seen again in 1 to 2 weeks.  Basic metabolic panel and hemoglobin will be done in 3 to 5 days.  He will be seeing his primary  physician, Dr. Lavone Orn in 3 days.  We agree with Dr. Delene Ruffini recommendation of adding metolazone to his baseline loop diuretic and have asked him to take metolazone twice a week rather than weight-based.  He will be seen by heart failure team in 9 days.  They were informed he will be admitted to the hospital if volume overload progresses or if laboratory data becomes significantly abnormal.  Cardiology Office Note   Date:  03/17/2018   ID:  Harrison Mons, DOB 10-Sep-1940, MRN [id number]  PCP:  Lavone Orn, MD  Cardiologist:  Dr. Tamala Julian     Chief Complaint  Patient presents with  . Leg Swelling  . Shortness of Breath      History of Present Illness: ULMER DEGEN is a 78 y.o. male who presents for post hospital follow up, actually 2 hospitalizations since last visit.    He has a history of CAD status post CABG in 0347, chronic diastolic CHF, bilateral carotid artery disease status post endarterectomy, PAD with claudication, HTN, CKD, HLD.  Patient saw Dr. Tamala Julian 02/03/2018 with worsening bilateral lower extremity edema. Right lower extremity swelling has been present since bypass surgery in 2013. BNP 10,666 creatinine was 1.9 5 repeat on 9/16 creatinine 1.84.Was on Lasix 80 mg BID and switchedplaced on torsemide 60 mg twice daily. Weight was 256 that day. He is scheduled for an echo. Previous echo 04/11/2015 LVEF 50 to 55% with grade 1 DD and severely dilated left atrium.  Ms. Bonnell Public saw the patient 02/09/2018 with worsening edema, 9 pound weight gain and BNP over 10,000.  I  added metolazone 5 mg every other day and increase his potassium.  He was eating a lot of salt and [place] food.  2 g sodium diet given.  2D echo that day showed LVEF declined to 40 to 45% with anterior hypokinesis and grade 2 DD with high LV filling pressures.  Again seen by Ms. Bonnell Public 02/16/18  With 10 lb wt loss. DId not think he felt much better.  Still short of breath and has significant edema.   Wondering if he has cellulitis although he has been on Keflex for a long time because of his groin infection.  Has some weeping from his legs.  Was then hospitalized 02/16/18 with acute on chronic systolic and diastolic HF.  Weight overall went from 113.4 lg to 110.7 kg. Noted as negative >4 L this admission but likely more discharged with prn metolzone.   CKD with cr 13.-1.4 but increase to 2.3 on admit at discharge 2.06   EF now 40-45%,  WMA w/ more significant anterior hypokinesis on echo. Given his start of anticoagulation, lack of symptoms, and negative troponin, would be inclined to have him follow up with Dr. Tamala Julian as an outpatient to evaluate his symptoms and allow him to recover from this acute episode. Right and left heart catheterization can be discussed at that time. For now, continue medical management w/ ASA, statin and?blocker.  New a fib rate controlled in hospital at times.  On eliquis for anticoagulation.  started  A fib 02/26/18.    Cellulitis of groin  D/c'd  03/03/18  Then readmitted 03/08/18 with dark sticky stools, +DOE, + wekaness.  Hgb was 8.8 Given 1 unit PRBCs, + edema, asa and Eliquis held.  CHA2DS2VASc 6  EGD without acute bleeding  Per Dr. Cristina Gong  " I agree with Dr. Paulita Fujita that it is appropriate to resume anticoagulation (I would be comfortable with both aspirin and Eliquis as he was on prior to admission)."  Pt back on torsemide  Wt at discharge 115.2 kg  Today his wt is up 3-4 lbs but large amount of edema in his legs.  Legs are weeping.  He has trouble at night.  His color is possible jaundice.  For his a fib he is on eliquis and BB.  He also has had hematuria, over last 2 days mild, with lower abd discomfort. Some burning with urination.  He is very fatigued.  He did take metolazone yesterday.    HR today is 112. A fib. No further chest pressure.  He has appt with Dr. Haroldine Laws 04/01/18.  Appetite comes and goes.   Past Medical History:  Diagnosis Date  .  Arthritis   . Atherosclerotic heart disease   . Back pain   . Bruises easily    takes Pletal daily and ASA 325mg  daily  . Cataract immature    right  . Chest pain   . Colon polyps   . Complication of anesthesia    hard to wake up with bypass surgery  . Cyst    on perineum;takes Doxycycline daily  . Diabetes mellitus    takes Glipizide,Metformin,and Actos daily  . Diarrhea   . Dyspnea    when lying flat  . Edema    right leg knee down swollen since fall 3 wks ago  . Foot drop, left   . GERD (gastroesophageal reflux disease)    Rolaids as needed-occ reflux  . Hemorrhoids   . History of kidney stones    at age 67   .  Hyperlipidemia    takes Tricor and Lipitor daily  . Hypertension    takes Altace,Amlodipine,and Nadolol daily  . Hypothyroidism   . Kidney stone    35+yrs ago  . Muscle pain   . Nasal polyps    hx of  . Peripheral edema    wears knee length hose-told by podiatrist to wear these  . Peripheral neuropathy    takes Gabapentin daily  . Pneumonia    as a child  . PVD (peripheral vascular disease) ([place])   . Renal insufficiency   . Seasonal allergies    takes Mucinex and Zyrtec prn  . Urinary frequency    urgency/takes Vesicare daily    Past Surgical History:  Procedure Laterality Date  . Aortobifemoral bypass    . APPLICATION OF WOUND VAC Left 10/25/2017   Procedure: Wound vac placement to left groin.;  Surgeon: Elam Dutch, MD;  Location: [facility] OR;  Service: Vascular;  Laterality: Left;  . APPLICATION OF WOUND VAC Left 11/10/2017   Procedure: APPLICATION OF WOUND VAC;  Surgeon: Elam Dutch, MD;  Location: [place];  Service: Vascular;  Laterality: Left;  . CARDIAC CATHETERIZATION  most recent 05/2011   total of 4  . CAROTID ANGIOGRAM N/A 06/05/2011   Procedure: CAROTID ANGIOGRAM;  Surgeon: Elam Dutch, MD;  Location: [facility] CATH LAB;  Service: Cardiovascular;  Laterality: N/A;  . CAROTID ENDARTERECTOMY  04/08/2011   left CEA  . cataract surgery  Bilateral    left  . COLONOSCOPY    . COLONOSCOPY WITH PROPOFOL N/A 08/22/2013   Procedure: COLONOSCOPY WITH PROPOFOL;  Surgeon: Garlan Fair, MD;  Location: [facility] ENDOSCOPY;  Service: Endoscopy;  Laterality: N/A;  . CORONARY ARTERY BYPASS GRAFT  09/09/2011   Procedure: CORONARY ARTERY BYPASS GRAFTING (CABG);  Surgeon: Gaye Pollack, MD;  Location: [place];  Service: Open Heart Surgery;  Laterality: N/A;  Coronary artery bypass grafting  x three with Right saphenous vein harvested endoscopically and left internal mammary artery  . ENDARTERECTOMY  04/08/2011   Procedure: ENDARTERECTOMY CAROTID;  Surgeon: Elam Dutch, MD;  Location[facility];  Service: Vascular;  Laterality: Left;  with patch angioplasty  . ENDARTERECTOMY  09/09/2011   Procedure: ENDARTERECTOMY CAROTID;  Surgeon: Elam Dutch, MD;  Location: [facility] OR;  Service: Vascular;  Laterality: Right;  with patch angioplasty  . ESOPHAGOGASTRODUODENOSCOPY (EGD) WITH PROPOFOL N/A 03/10/2018   Procedure: ESOPHAGOGASTRODUODENOSCOPY (EGD) WITH PROPOFOL;  Surgeon: Arta Silence, MD;  Location: [place];  Service: Endoscopy;  Laterality: N/A;  . FEMORAL-POPLITEAL BYPASS GRAFT Left 10/25/2017   Procedure: Left Common Femoral Endarterectomy and perfundaplasty, Left BYPASS GRAFT FEMORAL-BELOW KNEE POPLITEAL ARTERY.;  Surgeon: Elam Dutch, MD;  Location: [place];  Service: Vascular;  Laterality: Left;  . GROIN DEBRIDEMENT Left 11/10/2017   Procedure: GROIN DEBRIDEMENT;  Surgeon: Elam Dutch, MD;  Location: [place];  Service: Vascular;  Laterality: Left;  . LEFT HEART CATHETERIZATION WITH CORONARY ANGIOGRAM N/A 07/09/2011   Procedure: LEFT HEART CATHETERIZATION WITH CORONARY ANGIOGRAM;  Surgeon: Sinclair Grooms, MD;  Location: [facility] CATH LAB;  Service: Cardiovascular;  Laterality: N/A;  . LOWER EXTREMITY ANGIOGRAPHY N/A 10/11/2017   Procedure: LOWER EXTREMITY ANGIOGRAPHY;  Surgeon: Waynetta Sandy, MD;  Location: [place] CV LAB;   Service: Cardiovascular;  Laterality: N/A;  . Reimplantation of inferior mesenteric artery    . Repair of infrarenal abdominal aortic aneurysm    . SHOULDER ARTHROSCOPY W/ ROTATOR CUFF REPAIR  right and left-open procedures  . TONSILLECTOMY     at age 85  . TRIGGER FINGER RELEASE Right 06/28/2014   Procedure: RIGHT LONG TRIGGER RELEASE ;  Surgeon: Leanora Cover, MD;  Location: [place];  Service: Orthopedics;  Laterality: Right;  . wisdom teeth extracted     as a teenager  . WOUND DEBRIDEMENT Left 11/05/2017   Procedure: DEBRIDEMENT WOUND LEFT GROIN with wound vac placement;  Surgeon: Elam Dutch, MD;  Location: [facility] OR;  Service: Vascular;  Laterality: Left;     Current Outpatient Medications  Medication Sig Dispense Refill  . acetaminophen (TYLENOL) 500 MG tablet Take 1,000 mg by mouth every 6 (six) hours as needed for headache (pain).    Marland Kitchen apixaban (ELIQUIS) 5 MG TABS tablet Take 1 tablet (5 mg total) by mouth 2 (two) times daily. 60 tablet 0  . Ascorbic Acid (VITAMIN C PO) Take 1 tablet by mouth 2 (two) times daily.    Marland Kitchen atorvastatin (LIPITOR) 20 MG tablet Take 20 mg by mouth at bedtime.     . carvedilol (COREG) 25 MG tablet Take 1 tablet (25 mg total) by mouth 2 (two) times daily. 60 tablet 0  . fenofibrate 54 MG tablet Take 1 tablet (54 mg total) by mouth daily. 30 tablet 0  . gabapentin (NEURONTIN) 300 MG capsule Take 300 mg by mouth 2 (two) times daily.    Marland Kitchen levothyroxine (SYNTHROID, LEVOTHROID) 100 MCG tablet Take 100 mcg by mouth daily before breakfast.   3  . methylcellulose (ARTIFICIAL TEARS) 1 % ophthalmic solution Place 1 drop into both eyes 2 (two) times daily as needed (dry eyes).    . metolazone (ZAROXOLYN) 5 MG tablet Take 5 mg by mouth See admin instructions. Take one tablet (5 mg) by mouth when directed to do so by cardiologist.    . Multiple Vitamin (MULTIVITAMIN WITH MINERALS) TABS tablet Take 1 tablet by mouth daily.    . nitroGLYCERIN (NITROSTAT)  0.4 MG SL tablet PLACE 1 TABLET UNDER TONGUE EVERY 5 MINUTES AS NEEDED FOR CHEST PAIN. 75 tablet 1  . oxymetazoline (VICKS SINEX) 0.05 % nasal spray Place 1 spray into both nostrils 2 (two) times daily as needed for congestion.    . pantoprazole (PROTONIX) 40 MG tablet Take 1 tablet (40 mg total) by mouth daily. 30 tablet 1  . pioglitazone (ACTOS) 30 MG tablet Take 1 tablet (30 mg total) by mouth daily with lunch.    . pseudoephedrine-guaifenesin (MUCINEX D) 60-600 MG per tablet Take 1 tablet by mouth every 12 (twelve) hours as needed for congestion.     . sodium chloride (OCEAN) 0.65 % SOLN nasal spray Place 1 spray into both nostrils 2 (two) times daily as needed for congestion.    . tamsulosin (FLOMAX) 0.4 MG CAPS capsule Take 0.4 mg by mouth every other day.    . torsemide (DEMADEX) 20 MG tablet Take 3 tablets (60 mg total) by mouth 2 (two) times daily. 180 tablet 3  . vitamin B-12 (CYANOCOBALAMIN) 500 MCG tablet Take 500 mcg by mouth daily.    Marland Kitchen zinc gluconate 50 MG tablet Take 50 mg by mouth daily.    Marland Kitchen amiodarone (PACERONE) 200 MG tablet Take 1 tablet (200 mg total) by mouth 2 (two) times daily. 180 tablet 3   No current facility-administered medications for this visit.     Allergies:   Amoxicillin-pot clavulanate; Morphine and related; Percocet [oxycodone-acetaminophen]; and Restoril [temazepam]    Social History:  The  patient  reports that he quit smoking about 12 years ago. His smoking use included cigarettes. He quit after 40.00 years of use. He has never used smokeless tobacco. He reports that he drinks about 2.0 - 3.0 standard drinks of alcohol per week. He reports that he does not use drugs.   Family History:  The patient's family history includes Heart disease in his father and mother.    ROS:  General:no colds or fevers, no weight changes Skin:no rashes or ulcers HEENT:no blurred vision, no congestion CV:see HPI PUL:see HPI GI:no diarrhea constipation no further melena, no  indigestion GU:+ hematuria, mild dysuria MS:no joint pain, no claudication, per wife cellulitis of groin is almost gone.  --left toe was discolored and saw vacular yesterday.   Neuro:no syncope, no lightheadedness Endo:+ diabetes, + thyroid disease  Wt Readings from Last 3 Encounters:  03/17/18 260 lb 1.9 oz (118 kg)  03/15/18 262 lb (118.8 kg)  03/11/18 252 lb 3.2 oz (114.4 kg)     PHYSICAL EXAM: VS:  BP 130/80   Pulse (!) 112   Ht 5\' 11"  (1.803 m)   Wt 260 lb 1.9 oz (118 kg)   SpO2 98%   BMI 36.28 kg/m  , BMI Body mass index is 36.28 kg/m. General:Pleasant affect, NAD Skin:Warm and dry, brisk capillary refill, color yellowish waxy  HEENT:normocephalic, sclera clear, mucus membranes moist Neck:supple, no JVD, no bruits  Heart:irreg irreg without murmur, gallup, rub or click Lungs:clear with few rales, no rhonchi, or wheezes EXM:DYJW, non tender, + BS, do not palpate liver spleen or masses Ext:3-4+ lower ext edema, with weeping bil.  Both legs wrapped. , 2+ radial pulses, pt with tremors of upper ext at times.  Neuro:alert and oriented X 3, MAE, follows commands, + facial symmetry    EKG:  EKG is ordered today. The ekg ordered today demonstrates a fib with HR 112 and RBBB.  No acute changes from previous but faster.     Recent Labs: 02/03/2018: TSH 4.570 02/24/2018: NT-Pro BNP 17,251 02/25/2018: B Natriuretic Peptide 1,809.7 02/27/2018: Magnesium 2.3 03/07/2018: ALT 12 03/11/2018: BUN 48; Creatinine, Ser 1.85; Hemoglobin 9.1; Platelets 186; Potassium 3.9; Sodium 135    Lipid Panel No results found for: CHOL, TRIG, HDL, CHOLHDL, VLDL, LDLCALC, LDLDIRECT     Other studies Reviewed: Additional studies/ records that were reviewed today include: .  2D echo 02/09/2018 Study Conclusions  - Left ventricle: The cavity size was normal. There was moderate concentric hypertrophy. Systolic function was mildly to moderately reduced. The estimated ejection fraction was in  the range of 40% to 45%. Anterior hypokinesis. Grade 2 DD with high LV filling pressure. - Aortic valve: Calcified without stenosis. Mean gradient (S): 6 mm Hg. - Left atrium: Moderately dilated. - Right atrium: The atrium was mildly dilated. - Inferior vena cava: The vessel was normal in size. The respirophasic diameter changes were in the normal range (>= 50%), consistent with normal central venous pressure.  Impressions:  - Technically difficult study. LVEF 40-45%, more significant anterior hypokinesis, grade 2 DD, high LV filling pressure, moderate LAE, mild RAE.  2D echo 04/11/2015 Study Conclusions  - Left ventricle: The cavity size was mildly dilated. There was mild concentric hypertrophy. Systolic function was normal. The estimated ejection fraction was in the range of 50% to 55%. Poor visualization of the endomyocardial border limits the accuracy of wall motion and LVEF assessment. Patient refuse contrast. Wall motion was normal; there were no regional wall motion abnormalities. Doppler parameters are  consistent with abnormal left ventricular relaxation (grade 1 diastolic dysfunction). - Left atrium: The atrium was severely dilated. - Right ventricle: The cavity size was normal. Wall thickness was normal. Systolic function was normal.    ASSESSMENT AND PLAN:  1.  A fib with RVR, now back on Eliquis and ASA, with hematuria and recent GI bleed.  Will stop ASA, continue eliquis last HGB 9.7 on 03/15/18  Dr. Tamala Julian saw pt as well.  Will begin amiodarone 200 mg BID, pt may convert on amiodarone - difficult to control edema in a fib.  If a fib continues may need TEE DCCV in future.   Dr. Laurann Montana to see pt on Monday and Dr. Haroldine Laws the next week - we will see back in 3 weeks.   Check hepatic panel, BMP and magnesium.   Dr. Tamala Julian talked with pt at length and discussed trying to control edema as outpt vs in pt, pt would prefer outpt care if at  all possible.    2.  Recent GI bleed and now with some hematuria.  rec'd 1 unit PRBCs in hospital and EGD was done, ASA and eliquis resumed at discharge.   3.  CAD with remote CABG and on echo prior to a fib, his EF was decreased from  50-55% to 40-45%, WMA with more significant anterior hypokinesis on echo.   - continue statin and BB, we did stop asa due to bleeding issues.  No further chest pressure.  4.  CKD  With baseline Cr 1.3 to 1.4, on original hospitalization Cr up to 2.3  Most recent 03/11/18 was 1.85 on torsemide and metolazone  Check BMP today  5.  PAD with residual gangrenous toe followed by Dr. Oneida Alar Status post remote aortobifem with left common femoral endarterectomy and femoropopliteal bypass grafting  6.  HTN controlled   7.  DM-2, pt is on Actos and there is concern that actos may add to edema.  Followed by PCP.    Current medicines are reviewed with the patient today.  The patient Has no concerns regarding medicines.  The following changes have been made:  See above Labs/ tests ordered today include:see above  Disposition:   FU:  see above  Signed, Cecilie Kicks, NP  03/17/2018 5:49 PM    [facility] [place], [place], [place], [place] Phone: [phone]; Fax: [phone]

## 2018-03-17 NOTE — Patient Instructions (Addendum)
Medication Instructions:  Your physician has recommended you make the following change in your medication:  1.  STOP the Aspirin 2.  START Amiodarone 200 mg takig twice a day 3.  TAKE Metolazone Sunday   If you need a refill on your cardiac medications before your next appointment, please call your pharmacy.   Lab work: TODAY:  BMET, MAG, & HEPATIC  If you have labs (blood work) drawn today and your tests are completely normal, you will receive your results only by: Marland Kitchen MyChart Message (if you have MyChart) OR . A paper copy in the mail If you have any lab test that is abnormal or we need to change your treatment, we will call you to review the results.  Testing/Procedures: None ordered  Follow-Up: Your physician recommends that you schedule a follow-up appointment in: 04/07/18 arrive at 10:45 to see Cecilie Kicks, NP  Any Other Special Instructions Will Be Listed Below (If Applicable).

## 2018-03-18 ENCOUNTER — Telehealth: Payer: Self-pay | Admitting: *Deleted

## 2018-03-18 ENCOUNTER — Inpatient Hospital Stay (HOSPITAL_COMMUNITY)
Admission: EM | Admit: 2018-03-18 | Discharge: 2018-04-06 | DRG: 291 | Disposition: A | Discharge status: Home Health | Payer: Medicare HMO | Attending: Internal Medicine | Admitting: Internal Medicine

## 2018-03-18 ENCOUNTER — Emergency Department (HOSPITAL_COMMUNITY): Payer: Medicare HMO

## 2018-03-18 ENCOUNTER — Encounter (HOSPITAL_COMMUNITY): Payer: Self-pay

## 2018-03-18 ENCOUNTER — Telehealth: Payer: Self-pay | Admitting: Cardiology

## 2018-03-18 ENCOUNTER — Other Ambulatory Visit: Payer: Self-pay

## 2018-03-18 ENCOUNTER — Inpatient Hospital Stay
Admission: AD | Admit: 2018-03-18 | Payer: Medicare HMO | Source: Ambulatory Visit | Admitting: Interventional Cardiology

## 2018-03-18 DIAGNOSIS — I5041 Acute combined systolic (congestive) and diastolic (congestive) heart failure: Secondary | ICD-10-CM | POA: Diagnosis not present

## 2018-03-18 DIAGNOSIS — Z885 Allergy status to narcotic agent status: Secondary | ICD-10-CM

## 2018-03-18 DIAGNOSIS — Z7189 Other specified counseling: Secondary | ICD-10-CM

## 2018-03-18 DIAGNOSIS — I4891 Unspecified atrial fibrillation: Secondary | ICD-10-CM | POA: Diagnosis present

## 2018-03-18 DIAGNOSIS — I4510 Unspecified right bundle-branch block: Secondary | ICD-10-CM | POA: Diagnosis present

## 2018-03-18 DIAGNOSIS — R000 Tachycardia, unspecified: Secondary | ICD-10-CM | POA: Diagnosis not present

## 2018-03-18 DIAGNOSIS — Z7901 Long term (current) use of anticoagulants: Secondary | ICD-10-CM

## 2018-03-18 DIAGNOSIS — B9620 Unspecified Escherichia coli [E. coli] as the cause of diseases classified elsewhere: Secondary | ICD-10-CM | POA: Diagnosis not present

## 2018-03-18 DIAGNOSIS — Z87891 Personal history of nicotine dependence: Secondary | ICD-10-CM

## 2018-03-18 DIAGNOSIS — I255 Ischemic cardiomyopathy: Secondary | ICD-10-CM | POA: Diagnosis present

## 2018-03-18 DIAGNOSIS — E876 Hypokalemia: Secondary | ICD-10-CM | POA: Diagnosis present

## 2018-03-18 DIAGNOSIS — N183 Chronic kidney disease, stage 3 unspecified: Secondary | ICD-10-CM | POA: Diagnosis present

## 2018-03-18 DIAGNOSIS — E8770 Fluid overload, unspecified: Secondary | ICD-10-CM | POA: Diagnosis present

## 2018-03-18 DIAGNOSIS — A4150 Gram-negative sepsis, unspecified: Secondary | ICD-10-CM | POA: Diagnosis not present

## 2018-03-18 DIAGNOSIS — A419 Sepsis, unspecified organism: Secondary | ICD-10-CM | POA: Diagnosis not present

## 2018-03-18 DIAGNOSIS — I10 Essential (primary) hypertension: Secondary | ICD-10-CM | POA: Diagnosis not present

## 2018-03-18 DIAGNOSIS — I5040 Unspecified combined systolic (congestive) and diastolic (congestive) heart failure: Secondary | ICD-10-CM | POA: Diagnosis present

## 2018-03-18 DIAGNOSIS — I252 Old myocardial infarction: Secondary | ICD-10-CM | POA: Diagnosis not present

## 2018-03-18 DIAGNOSIS — G9341 Metabolic encephalopathy: Secondary | ICD-10-CM | POA: Diagnosis not present

## 2018-03-18 DIAGNOSIS — R5381 Other malaise: Secondary | ICD-10-CM | POA: Diagnosis present

## 2018-03-18 DIAGNOSIS — E1122 Type 2 diabetes mellitus with diabetic chronic kidney disease: Secondary | ICD-10-CM | POA: Diagnosis present

## 2018-03-18 DIAGNOSIS — K922 Gastrointestinal hemorrhage, unspecified: Secondary | ICD-10-CM | POA: Diagnosis not present

## 2018-03-18 DIAGNOSIS — A4159 Other Gram-negative sepsis: Secondary | ICD-10-CM | POA: Diagnosis not present

## 2018-03-18 DIAGNOSIS — K219 Gastro-esophageal reflux disease without esophagitis: Secondary | ICD-10-CM | POA: Diagnosis present

## 2018-03-18 DIAGNOSIS — R7881 Bacteremia: Secondary | ICD-10-CM | POA: Diagnosis not present

## 2018-03-18 DIAGNOSIS — R404 Transient alteration of awareness: Secondary | ICD-10-CM | POA: Diagnosis not present

## 2018-03-18 DIAGNOSIS — D649 Anemia, unspecified: Secondary | ICD-10-CM | POA: Diagnosis present

## 2018-03-18 DIAGNOSIS — I132 Hypertensive heart and chronic kidney disease with heart failure and with stage 5 chronic kidney disease, or end stage renal disease: Secondary | ICD-10-CM | POA: Diagnosis not present

## 2018-03-18 DIAGNOSIS — I25119 Atherosclerotic heart disease of native coronary artery with unspecified angina pectoris: Secondary | ICD-10-CM | POA: Diagnosis not present

## 2018-03-18 DIAGNOSIS — N179 Acute kidney failure, unspecified: Secondary | ICD-10-CM | POA: Diagnosis not present

## 2018-03-18 DIAGNOSIS — Z515 Encounter for palliative care: Secondary | ICD-10-CM | POA: Diagnosis not present

## 2018-03-18 DIAGNOSIS — E785 Hyperlipidemia, unspecified: Secondary | ICD-10-CM | POA: Diagnosis present

## 2018-03-18 DIAGNOSIS — F0390 Unspecified dementia without behavioral disturbance: Secondary | ICD-10-CM | POA: Diagnosis present

## 2018-03-18 DIAGNOSIS — N390 Urinary tract infection, site not specified: Secondary | ICD-10-CM | POA: Diagnosis not present

## 2018-03-18 DIAGNOSIS — Z8249 Family history of ischemic heart disease and other diseases of the circulatory system: Secondary | ICD-10-CM

## 2018-03-18 DIAGNOSIS — I429 Cardiomyopathy, unspecified: Secondary | ICD-10-CM | POA: Diagnosis not present

## 2018-03-18 DIAGNOSIS — R531 Weakness: Secondary | ICD-10-CM

## 2018-03-18 DIAGNOSIS — Z951 Presence of aortocoronary bypass graft: Secondary | ICD-10-CM

## 2018-03-18 DIAGNOSIS — I484 Atypical atrial flutter: Secondary | ICD-10-CM | POA: Diagnosis not present

## 2018-03-18 DIAGNOSIS — E1121 Type 2 diabetes mellitus with diabetic nephropathy: Secondary | ICD-10-CM

## 2018-03-18 DIAGNOSIS — I739 Peripheral vascular disease, unspecified: Secondary | ICD-10-CM | POA: Diagnosis not present

## 2018-03-18 DIAGNOSIS — I110 Hypertensive heart disease with heart failure: Secondary | ICD-10-CM | POA: Diagnosis not present

## 2018-03-18 DIAGNOSIS — I5043 Acute on chronic combined systolic (congestive) and diastolic (congestive) heart failure: Secondary | ICD-10-CM | POA: Diagnosis present

## 2018-03-18 DIAGNOSIS — E1151 Type 2 diabetes mellitus with diabetic peripheral angiopathy without gangrene: Secondary | ICD-10-CM | POA: Diagnosis present

## 2018-03-18 DIAGNOSIS — E1165 Type 2 diabetes mellitus with hyperglycemia: Secondary | ICD-10-CM | POA: Diagnosis present

## 2018-03-18 DIAGNOSIS — I2510 Atherosclerotic heart disease of native coronary artery without angina pectoris: Secondary | ICD-10-CM | POA: Diagnosis present

## 2018-03-18 DIAGNOSIS — E669 Obesity, unspecified: Secondary | ICD-10-CM | POA: Diagnosis present

## 2018-03-18 DIAGNOSIS — Z6833 Body mass index (BMI) 33.0-33.9, adult: Secondary | ICD-10-CM

## 2018-03-18 DIAGNOSIS — I130 Hypertensive heart and chronic kidney disease with heart failure and stage 1 through stage 4 chronic kidney disease, or unspecified chronic kidney disease: Principal | ICD-10-CM | POA: Diagnosis present

## 2018-03-18 DIAGNOSIS — I480 Paroxysmal atrial fibrillation: Secondary | ICD-10-CM | POA: Diagnosis present

## 2018-03-18 DIAGNOSIS — D631 Anemia in chronic kidney disease: Secondary | ICD-10-CM | POA: Diagnosis present

## 2018-03-18 DIAGNOSIS — E8779 Other fluid overload: Secondary | ICD-10-CM | POA: Diagnosis not present

## 2018-03-18 DIAGNOSIS — B961 Klebsiella pneumoniae [K. pneumoniae] as the cause of diseases classified elsewhere: Secondary | ICD-10-CM | POA: Diagnosis not present

## 2018-03-18 DIAGNOSIS — E8809 Other disorders of plasma-protein metabolism, not elsewhere classified: Secondary | ICD-10-CM | POA: Diagnosis present

## 2018-03-18 DIAGNOSIS — E118 Type 2 diabetes mellitus with unspecified complications: Secondary | ICD-10-CM | POA: Diagnosis not present

## 2018-03-18 DIAGNOSIS — E039 Hypothyroidism, unspecified: Secondary | ICD-10-CM | POA: Diagnosis present

## 2018-03-18 DIAGNOSIS — I5021 Acute systolic (congestive) heart failure: Secondary | ICD-10-CM | POA: Diagnosis not present

## 2018-03-18 DIAGNOSIS — E1159 Type 2 diabetes mellitus with other circulatory complications: Secondary | ICD-10-CM | POA: Diagnosis not present

## 2018-03-18 DIAGNOSIS — R600 Localized edema: Secondary | ICD-10-CM | POA: Diagnosis present

## 2018-03-18 DIAGNOSIS — J90 Pleural effusion, not elsewhere classified: Secondary | ICD-10-CM | POA: Diagnosis not present

## 2018-03-18 DIAGNOSIS — I483 Typical atrial flutter: Secondary | ICD-10-CM | POA: Diagnosis not present

## 2018-03-18 DIAGNOSIS — I340 Nonrheumatic mitral (valve) insufficiency: Secondary | ICD-10-CM | POA: Diagnosis not present

## 2018-03-18 DIAGNOSIS — I5082 Biventricular heart failure: Secondary | ICD-10-CM | POA: Diagnosis present

## 2018-03-18 DIAGNOSIS — I4892 Unspecified atrial flutter: Secondary | ICD-10-CM | POA: Diagnosis not present

## 2018-03-18 DIAGNOSIS — R0902 Hypoxemia: Secondary | ICD-10-CM | POA: Diagnosis present

## 2018-03-18 DIAGNOSIS — E878 Other disorders of electrolyte and fluid balance, not elsewhere classified: Secondary | ICD-10-CM | POA: Diagnosis not present

## 2018-03-18 DIAGNOSIS — Z881 Allergy status to other antibiotic agents status: Secondary | ICD-10-CM

## 2018-03-18 DIAGNOSIS — E871 Hypo-osmolality and hyponatremia: Secondary | ICD-10-CM | POA: Diagnosis not present

## 2018-03-18 DIAGNOSIS — I5023 Acute on chronic systolic (congestive) heart failure: Secondary | ICD-10-CM | POA: Diagnosis not present

## 2018-03-18 DIAGNOSIS — K2970 Gastritis, unspecified, without bleeding: Secondary | ICD-10-CM | POA: Diagnosis present

## 2018-03-18 DIAGNOSIS — I509 Heart failure, unspecified: Secondary | ICD-10-CM

## 2018-03-18 DIAGNOSIS — N184 Chronic kidney disease, stage 4 (severe): Secondary | ICD-10-CM | POA: Diagnosis not present

## 2018-03-18 DIAGNOSIS — K869 Disease of pancreas, unspecified: Secondary | ICD-10-CM | POA: Diagnosis present

## 2018-03-18 DIAGNOSIS — Z7982 Long term (current) use of aspirin: Secondary | ICD-10-CM

## 2018-03-18 DIAGNOSIS — Z79899 Other long term (current) drug therapy: Secondary | ICD-10-CM

## 2018-03-18 DIAGNOSIS — N170 Acute kidney failure with tubular necrosis: Secondary | ICD-10-CM | POA: Diagnosis not present

## 2018-03-18 DIAGNOSIS — I5020 Unspecified systolic (congestive) heart failure: Secondary | ICD-10-CM | POA: Diagnosis not present

## 2018-03-18 DIAGNOSIS — I361 Nonrheumatic tricuspid (valve) insufficiency: Secondary | ICD-10-CM | POA: Diagnosis not present

## 2018-03-18 DIAGNOSIS — W19XXXA Unspecified fall, initial encounter: Secondary | ICD-10-CM | POA: Diagnosis not present

## 2018-03-18 DIAGNOSIS — Z888 Allergy status to other drugs, medicaments and biological substances status: Secondary | ICD-10-CM

## 2018-03-18 DIAGNOSIS — J302 Other seasonal allergic rhinitis: Secondary | ICD-10-CM | POA: Diagnosis present

## 2018-03-18 LAB — COMPREHENSIVE METABOLIC PANEL
ALBUMIN: 2.5 g/dL — AB (ref 3.5–5.0)
ALT: 13 U/L (ref 0–44)
ANION GAP: 14 (ref 5–15)
AST: 26 U/L (ref 15–41)
Alkaline Phosphatase: 32 U/L — ABNORMAL LOW (ref 38–126)
BUN: 75 mg/dL — AB (ref 8–23)
CHLORIDE: 92 mmol/L — AB (ref 98–111)
CO2: 24 mmol/L (ref 22–32)
Calcium: 8.2 mg/dL — ABNORMAL LOW (ref 8.9–10.3)
Creatinine, Ser: 2.51 mg/dL — ABNORMAL HIGH (ref 0.61–1.24)
GFR calc Af Amer: 27 mL/min — ABNORMAL LOW (ref >60)
GFR calc non Af Amer: 23 mL/min — ABNORMAL LOW (ref >60)
GLUCOSE: 224 mg/dL — AB (ref 70–99)
POTASSIUM: 2.4 mmol/L — AB (ref 3.5–5.1)
SODIUM: 130 mmol/L — AB (ref 135–145)
Total Bilirubin: 1.3 mg/dL — ABNORMAL HIGH (ref 0.3–1.2)
Total Protein: 5.6 g/dL — ABNORMAL LOW (ref 6.5–8.1)

## 2018-03-18 LAB — TSH: TSH: 3.15 u[IU]/mL (ref 0.350–4.500)

## 2018-03-18 LAB — CBC WITH DIFFERENTIAL/PLATELET
ABS IMMATURE GRANULOCYTES: 0.06 10*3/uL (ref 0.00–0.07)
BASOS ABS: 0 10*3/uL (ref 0.0–0.1)
BASOS PCT: 0 %
Eosinophils Absolute: 0 10*3/uL (ref 0.0–0.5)
Eosinophils Relative: 0 %
HEMATOCRIT: 27.7 % — AB (ref 39.0–52.0)
Hemoglobin: 8.8 g/dL — ABNORMAL LOW (ref 13.0–17.0)
IMMATURE GRANULOCYTES: 1 %
Lymphocytes Relative: 4 %
Lymphs Abs: 0.4 10*3/uL — ABNORMAL LOW (ref 0.7–4.0)
MCH: 28.2 pg (ref 26.0–34.0)
MCHC: 31.8 g/dL (ref 30.0–36.0)
MCV: 88.8 fL (ref 80.0–100.0)
MONOS PCT: 8 %
Monocytes Absolute: 0.6 10*3/uL (ref 0.1–1.0)
NEUTROS ABS: 7.5 10*3/uL (ref 1.7–7.7)
NEUTROS PCT: 87 %
PLATELETS: 235 10*3/uL (ref 150–400)
RBC: 3.12 MIL/uL — AB (ref 4.22–5.81)
RDW: 16.4 % — AB (ref 11.5–15.5)
WBC: 8.6 10*3/uL (ref 4.0–10.5)
nRBC: 0 % (ref 0.0–0.2)

## 2018-03-18 LAB — BASIC METABOLIC PANEL
BUN/Creatinine Ratio: 28 — ABNORMAL HIGH (ref 10–24)
BUN: 67 mg/dL — AB (ref 8–27)
CALCIUM: 8.7 mg/dL (ref 8.6–10.2)
CHLORIDE: 89 mmol/L — AB (ref 96–106)
CO2: 26 mmol/L (ref 20–29)
Creatinine, Ser: 2.4 mg/dL — ABNORMAL HIGH (ref 0.76–1.27)
GFR calc Af Amer: 29 mL/min/{1.73_m2} — ABNORMAL LOW (ref >59)
GFR calc non Af Amer: 25 mL/min/{1.73_m2} — ABNORMAL LOW (ref >59)
Glucose: 267 mg/dL — ABNORMAL HIGH (ref 65–99)
POTASSIUM: 3.2 mmol/L — AB (ref 3.5–5.2)
Sodium: 132 mmol/L — ABNORMAL LOW (ref 134–144)

## 2018-03-18 LAB — HEPATIC FUNCTION PANEL
ALBUMIN: 3.4 g/dL — AB (ref 3.5–4.8)
ALT: 11 IU/L (ref 0–44)
AST: 20 IU/L (ref 0–40)
Alkaline Phosphatase: 40 IU/L (ref 39–117)
BILIRUBIN TOTAL: 0.7 mg/dL (ref 0.0–1.2)
Bilirubin, Direct: 0.43 mg/dL — ABNORMAL HIGH (ref 0.00–0.40)
TOTAL PROTEIN: 6.3 g/dL (ref 6.0–8.5)

## 2018-03-18 LAB — TROPONIN I: Troponin I: 0.03 ng/mL (ref <0.03)

## 2018-03-18 LAB — GLUCOSE, CAPILLARY: GLUCOSE-CAPILLARY: 235 mg/dL — AB (ref 70–99)

## 2018-03-18 LAB — BRAIN NATRIURETIC PEPTIDE: B Natriuretic Peptide: 2440 pg/mL — ABNORMAL HIGH (ref 0.0–100.0)

## 2018-03-18 LAB — POTASSIUM: Potassium: 2.8 mmol/L — ABNORMAL LOW (ref 3.5–5.1)

## 2018-03-18 LAB — MAGNESIUM
MAGNESIUM: 2 mg/dL (ref 1.6–2.3)
MAGNESIUM: 1.9 mg/dL (ref 1.7–2.4)

## 2018-03-18 MED ORDER — POTASSIUM CHLORIDE 10 MEQ/100ML IV SOLN
10.0000 meq | INTRAVENOUS | Status: AC
  Administered 2018-03-18 (×4): 10 meq via INTRAVENOUS
  Filled 2018-03-18 (×4): qty 100

## 2018-03-18 MED ORDER — SODIUM CHLORIDE 0.9% FLUSH
3.0000 mL | Freq: Two times a day (BID) | INTRAVENOUS | Status: DC
  Administered 2018-03-18 – 2018-03-22 (×7): 3 mL via INTRAVENOUS

## 2018-03-18 MED ORDER — INSULIN ASPART 100 UNIT/ML ~~LOC~~ SOLN
0.0000 [IU] | Freq: Three times a day (TID) | SUBCUTANEOUS | Status: DC
  Administered 2018-03-19 (×3): 7 [IU] via SUBCUTANEOUS
  Administered 2018-03-20: 4 [IU] via SUBCUTANEOUS
  Administered 2018-03-20 – 2018-03-21 (×3): 7 [IU] via SUBCUTANEOUS
  Administered 2018-03-21: 11 [IU] via SUBCUTANEOUS
  Administered 2018-03-21: 4 [IU] via SUBCUTANEOUS
  Administered 2018-03-22 (×3): 7 [IU] via SUBCUTANEOUS
  Administered 2018-03-23 (×2): 3 [IU] via SUBCUTANEOUS
  Administered 2018-03-24 – 2018-03-25 (×5): 4 [IU] via SUBCUTANEOUS
  Administered 2018-03-26: 3 [IU] via SUBCUTANEOUS
  Administered 2018-03-26: 7 [IU] via SUBCUTANEOUS
  Administered 2018-03-26: 4 [IU] via SUBCUTANEOUS
  Administered 2018-03-27: 3 [IU] via SUBCUTANEOUS
  Administered 2018-03-27 – 2018-03-30 (×7): 4 [IU] via SUBCUTANEOUS
  Administered 2018-03-31: 3 [IU] via SUBCUTANEOUS
  Administered 2018-04-01: 4 [IU] via SUBCUTANEOUS
  Administered 2018-04-02 (×2): 3 [IU] via SUBCUTANEOUS
  Administered 2018-04-03: 4 [IU] via SUBCUTANEOUS
  Administered 2018-04-05 (×3): 3 [IU] via SUBCUTANEOUS
  Administered 2018-04-06: 4 [IU] via SUBCUTANEOUS

## 2018-03-18 MED ORDER — FENOFIBRATE 54 MG PO TABS
54.0000 mg | ORAL_TABLET | Freq: Every day | ORAL | Status: DC
  Administered 2018-03-18 – 2018-04-06 (×20): 54 mg via ORAL
  Filled 2018-03-18 (×20): qty 1

## 2018-03-18 MED ORDER — AMIODARONE HCL 200 MG PO TABS
200.0000 mg | ORAL_TABLET | Freq: Two times a day (BID) | ORAL | Status: DC
  Administered 2018-03-18 – 2018-04-01 (×28): 200 mg via ORAL
  Filled 2018-03-18 (×28): qty 1

## 2018-03-18 MED ORDER — FUROSEMIDE 10 MG/ML IJ SOLN
120.0000 mg | Freq: Two times a day (BID) | INTRAVENOUS | Status: DC
  Administered 2018-03-18 – 2018-03-19 (×3): 120 mg via INTRAVENOUS
  Filled 2018-03-18 (×2): qty 10
  Filled 2018-03-18 (×2): qty 12

## 2018-03-18 MED ORDER — POTASSIUM CHLORIDE 10 MEQ/100ML IV SOLN
10.0000 meq | Freq: Once | INTRAVENOUS | Status: AC
  Administered 2018-03-18: 10 meq via INTRAVENOUS
  Filled 2018-03-18: qty 100

## 2018-03-18 MED ORDER — PANTOPRAZOLE SODIUM 40 MG PO TBEC
40.0000 mg | DELAYED_RELEASE_TABLET | Freq: Every day | ORAL | Status: DC
  Administered 2018-03-18 – 2018-04-06 (×20): 40 mg via ORAL
  Filled 2018-03-18 (×19): qty 1

## 2018-03-18 MED ORDER — POTASSIUM CHLORIDE CRYS ER 20 MEQ PO TBCR
40.0000 meq | EXTENDED_RELEASE_TABLET | Freq: Two times a day (BID) | ORAL | Status: DC
  Administered 2018-03-18: 40 meq via ORAL
  Filled 2018-03-18 (×2): qty 2

## 2018-03-18 MED ORDER — APIXABAN 5 MG PO TABS
5.0000 mg | ORAL_TABLET | Freq: Two times a day (BID) | ORAL | Status: DC
  Administered 2018-03-18 – 2018-03-23 (×10): 5 mg via ORAL
  Filled 2018-03-18 (×10): qty 1

## 2018-03-18 MED ORDER — SODIUM CHLORIDE 0.9 % IV SOLN: 250.0000 mL | INTRAVENOUS | Status: DC | PRN

## 2018-03-18 MED ORDER — NITROGLYCERIN 0.4 MG SL SUBL: 0.4000 mg | SUBLINGUAL_TABLET | SUBLINGUAL | Status: DC | PRN

## 2018-03-18 MED ORDER — TAMSULOSIN HCL 0.4 MG PO CAPS
0.4000 mg | ORAL_CAPSULE | ORAL | Status: DC
  Administered 2018-03-21 – 2018-04-06 (×9): 0.4 mg via ORAL
  Filled 2018-03-18 (×11): qty 1

## 2018-03-18 MED ORDER — LEVOTHYROXINE SODIUM 100 MCG PO TABS
100.0000 ug | ORAL_TABLET | Freq: Every day | ORAL | Status: DC
  Administered 2018-03-20 – 2018-04-06 (×18): 100 ug via ORAL
  Filled 2018-03-18 (×18): qty 1

## 2018-03-18 MED ORDER — ONDANSETRON HCL 4 MG/2ML IJ SOLN: 4.0000 mg | Freq: Four times a day (QID) | INTRAMUSCULAR | Status: DC | PRN

## 2018-03-18 MED ORDER — POTASSIUM CHLORIDE CRYS ER 20 MEQ PO TBCR: EXTENDED_RELEASE_TABLET | ORAL | 3 refills | Status: DC

## 2018-03-18 MED ORDER — FUROSEMIDE 10 MG/ML IJ SOLN: 40.0000 mg | Freq: Two times a day (BID) | INTRAMUSCULAR | Status: DC

## 2018-03-18 MED ORDER — FUROSEMIDE 10 MG/ML IJ SOLN
40.0000 mg | INTRAMUSCULAR | Status: AC
  Administered 2018-03-18: 40 mg via INTRAVENOUS
  Filled 2018-03-18: qty 4

## 2018-03-18 MED ORDER — SODIUM CHLORIDE 0.9% FLUSH
3.0000 mL | INTRAVENOUS | Status: DC | PRN
  Administered 2018-03-18: 3 mL via INTRAVENOUS
  Filled 2018-03-18: qty 3

## 2018-03-18 MED ORDER — CARVEDILOL 25 MG PO TABS
25.0000 mg | ORAL_TABLET | Freq: Two times a day (BID) | ORAL | Status: DC
  Administered 2018-03-18 – 2018-03-22 (×7): 25 mg via ORAL
  Filled 2018-03-18 (×7): qty 1

## 2018-03-18 MED ORDER — GABAPENTIN 300 MG PO CAPS
300.0000 mg | ORAL_CAPSULE | Freq: Two times a day (BID) | ORAL | Status: DC
  Administered 2018-03-19 – 2018-03-22 (×7): 300 mg via ORAL
  Filled 2018-03-18 (×8): qty 1

## 2018-03-18 MED ORDER — ATORVASTATIN CALCIUM 20 MG PO TABS
20.0000 mg | ORAL_TABLET | Freq: Every day | ORAL | Status: DC
  Administered 2018-03-18 – 2018-04-05 (×19): 20 mg via ORAL
  Filled 2018-03-18 (×4): qty 1
  Filled 2018-03-18: qty 2
  Filled 2018-03-18 (×14): qty 1

## 2018-03-18 MED ORDER — INSULIN ASPART 100 UNIT/ML ~~LOC~~ SOLN
0.0000 [IU] | Freq: Every day | SUBCUTANEOUS | Status: DC
  Administered 2018-03-18 – 2018-03-22 (×2): 2 [IU] via SUBCUTANEOUS

## 2018-03-18 MED ORDER — METOPROLOL TARTRATE 5 MG/5ML IV SOLN
5.0000 mg | INTRAVENOUS | Status: AC | PRN
  Administered 2018-03-18 – 2018-03-19 (×2): 5 mg via INTRAVENOUS
  Filled 2018-03-18 (×2): qty 5

## 2018-03-18 MED ORDER — ACETAMINOPHEN 500 MG PO TABS: 1000.0000 mg | ORAL_TABLET | Freq: Four times a day (QID) | ORAL | Status: DC | PRN

## 2018-03-18 MED ORDER — ACETAMINOPHEN 325 MG PO TABS: 650.0000 mg | ORAL_TABLET | ORAL | Status: DC | PRN

## 2018-03-18 MED ORDER — MAGNESIUM SULFATE 2 GM/50ML IV SOLN
2.0000 g | INTRAVENOUS | Status: AC
  Administered 2018-03-18: 2 g via INTRAVENOUS
  Filled 2018-03-18: qty 50

## 2018-03-18 NOTE — ED Notes (Signed)
Lab called with values, pot. 2.4 and Trop. 0.03 results given to Patient RN Whitney and Dr. Sabra Heck.

## 2018-03-18 NOTE — ED Notes (Signed)
Attempted report x 1; name and call back number provided 

## 2018-03-18 NOTE — H&P (Signed)
History and Physical    MATEJ SAPPENFIELD VOH:[id number] DOB: 1940/07/28 DOA: 03/18/2018  PCP: Lavone Orn, MD  Patient coming from: Home.   I have personally briefly reviewed patient's old medical records in [place]  Chief Complaint: generalized weakness, and sob.  HPI: [name] is a 77 y.o. male with medical history significant of systolic heart failure, atrial flutter, recent GI bleed. CAD, s/p CABG, hypothyroidism, diabetes mellitus, GERD, hypertension, hyperlipidemia stage III/stage IV CKD, cellulitis of the lower extremities, peripheral artery disease was recently discharged from the hospital  for GI bleed comes in today for generalized weakness and unable to ambulate and some shortness of breath on ambulation as per the wife at bedside.  Wife reports that patient was unable to use the walker, patient denies any chest pain at this time.  He appears very lethargic but able to open his eyes on calling his name and is oriented to place and person but not to time.  He reports being dizzy but denies any recent falls or syncope.  He denies any nausea, vomiting, abdominal pain or diarrhea.  The wife at bedside denies any fevers or chills.  No reported symptoms of dysuria.  No headaches.  Patient wife called cardiology office and they were asked to come to ED for further evaluation. ED Course: On arrival to ED he had low-grade temperature of 99.3, tachycardic with a heart rate of 122/min, blood pressure of 143/80 and hypoxic, requiring 4 L of nasal cannula oxygen.  Lab work revealed sodium of 130, potassium of 2.4 chloride of 92, glucose of 224, BUN of 75 and creatinine of 2.51, B natriuretic peptide of 2440. CXR shows early CHF.    He was referred to medical service for admission for generalized weakness and acute on chronic CHF.  Review of Systems: As per HPI otherwise all others review of systems negative.    Past Medical History:  Diagnosis Date  . Arthritis   .  Atherosclerotic heart disease   . Back pain   . Bruises easily    takes Pletal daily and ASA 325mg  daily  . Cataract immature    right  . Chest pain   . Colon polyps   . Complication of anesthesia    hard to wake up with bypass surgery  . Cyst    on perineum;takes Doxycycline daily  . Diabetes mellitus    takes Glipizide,Metformin,and Actos daily  . Diarrhea   . Dyspnea    when lying flat  . Edema    right leg knee down swollen since fall 3 wks ago  . Foot drop, left   . GERD (gastroesophageal reflux disease)    Rolaids as needed-occ reflux  . Hemorrhoids   . History of kidney stones    at age 38   . Hyperlipidemia    takes Tricor and Lipitor daily  . Hypertension    takes Altace,Amlodipine,and Nadolol daily  . Hypothyroidism   . Kidney stone    35+yrs ago  . Muscle pain   . Nasal polyps    hx of  . Peripheral edema    wears knee length hose-told by podiatrist to wear these  . Peripheral neuropathy    takes Gabapentin daily  . Pneumonia    as a child  . PVD (peripheral vascular disease) ([place])   . Renal insufficiency   . Seasonal allergies    takes Mucinex and Zyrtec prn  . Urinary frequency    urgency/takes Vesicare daily  Past Surgical History:  Procedure Laterality Date  . Aortobifemoral bypass    . APPLICATION OF WOUND VAC Left 10/25/2017   Procedure: Wound vac placement to left groin.;  Surgeon: Elam Dutch, MD;  Location: [facility] OR;  Service: Vascular;  Laterality: Left;  . APPLICATION OF WOUND VAC Left 11/10/2017   Procedure: APPLICATION OF WOUND VAC;  Surgeon: Elam Dutch, MD;  Location: [place];  Service: Vascular;  Laterality: Left;  . CARDIAC CATHETERIZATION  most recent 05/2011   total of 4  . CAROTID ANGIOGRAM N/A 06/05/2011   Procedure: CAROTID ANGIOGRAM;  Surgeon: Elam Dutch, MD;  Location: [facility] CATH LAB;  Service: Cardiovascular;  Laterality: N/A;  . CAROTID ENDARTERECTOMY  04/08/2011   left CEA  . cataract surgery Bilateral    left   . COLONOSCOPY    . COLONOSCOPY WITH PROPOFOL N/A 08/22/2013   Procedure: COLONOSCOPY WITH PROPOFOL;  Surgeon: Garlan Fair, MD;  Location: [facility] ENDOSCOPY;  Service: Endoscopy;  Laterality: N/A;  . CORONARY ARTERY BYPASS GRAFT  09/09/2011   Procedure: CORONARY ARTERY BYPASS GRAFTING (CABG);  Surgeon: Gaye Pollack, MD;  Location: [place];  Service: Open Heart Surgery;  Laterality: N/A;  Coronary artery bypass grafting  x three with Right saphenous vein harvested endoscopically and left internal mammary artery  . ENDARTERECTOMY  04/08/2011   Procedure: ENDARTERECTOMY CAROTID;  Surgeon: Elam Dutch, MD;  Location: [facility] OR;  Service: Vascular;  Laterality: Left;  with patch angioplasty  . ENDARTERECTOMY  09/09/2011   Procedure: ENDARTERECTOMY CAROTID;  Surgeon: Elam Dutch, MD;  Location: [facility] OR;  Service: Vascular;  Laterality: Right;  with patch angioplasty  . ESOPHAGOGASTRODUODENOSCOPY (EGD) WITH PROPOFOL N/A 03/10/2018   Procedure: ESOPHAGOGASTRODUODENOSCOPY (EGD) WITH PROPOFOL;  Surgeon: Arta Silence, MD;  Location: [place];  Service: Endoscopy;  Laterality: N/A;  . FEMORAL-POPLITEAL BYPASS GRAFT Left 10/25/2017   Procedure: Left Common Femoral Endarterectomy and perfundaplasty, Left BYPASS GRAFT FEMORAL-BELOW KNEE POPLITEAL ARTERY.;  Surgeon: Elam Dutch, MD;  Location: [place];  Service: Vascular;  Laterality: Left;  . GROIN DEBRIDEMENT Left 11/10/2017   Procedure: GROIN DEBRIDEMENT;  Surgeon: Elam Dutch, MD;  Location: [place];  Service: Vascular;  Laterality: Left;  . LEFT HEART CATHETERIZATION WITH CORONARY ANGIOGRAM N/A 07/09/2011   Procedure: LEFT HEART CATHETERIZATION WITH CORONARY ANGIOGRAM;  Surgeon: Sinclair Grooms, MD;  Location: [facility] CATH LAB;  Service: Cardiovascular;  Laterality: N/A;  . LOWER EXTREMITY ANGIOGRAPHY N/A 10/11/2017   Procedure: LOWER EXTREMITY ANGIOGRAPHY;  Surgeon: Waynetta Sandy, MD;  Location: [place] CV LAB;  Service:  Cardiovascular;  Laterality: N/A;  . Reimplantation of inferior mesenteric artery    . Repair of infrarenal abdominal aortic aneurysm    . SHOULDER ARTHROSCOPY W/ ROTATOR CUFF REPAIR     right and left-open procedures  . TONSILLECTOMY     at age 31  . TRIGGER FINGER RELEASE Right 06/28/2014   Procedure: RIGHT LONG TRIGGER RELEASE ;  Surgeon: Leanora Cover, MD;  Location: [place];  Service: Orthopedics;  Laterality: Right;  . wisdom teeth extracted     as a teenager  . WOUND DEBRIDEMENT Left 11/05/2017   Procedure: DEBRIDEMENT WOUND LEFT GROIN with wound vac placement;  Surgeon: Elam Dutch, MD;  Location: [facility] OR;  Service: Vascular;  Laterality: Left;     reports that he quit smoking about 12 years ago. His smoking use included cigarettes. He quit after 40.00 years of use. He has never  used smokeless tobacco. He reports that he drinks about 2.0 - 3.0 standard drinks of alcohol per week. He reports that he does not use drugs.  Allergies  Allergen Reactions  . Amoxicillin-Pot Clavulanate Nausea Only    Has patient had a PCN reaction causing immediate rash, facial/tongue/throat swelling, SOB or lightheadedness with hypotension: No Has patient had a PCN reaction causing severe rash involving mucus membranes or skin necrosis: No Has patient had a PCN reaction that required hospitalization: No Has patient had a PCN reaction occurring within the last 10 years: No If all of the above answers are "NO", then may proceed with Cephalosporin use.   Marland Kitchen Morphine And Related Other (See Comments)    hallucinations  . Percocet [Oxycodone-Acetaminophen] Other (See Comments)    Agitation and Increased Confusion. Family asks to please not order this medication for him.   . Restoril [Temazepam] Other (See Comments)    Confusion and Agitation. Has taken in past, but has not reacted well. Family requests to not give this mediation.     Family History  Problem Relation Age of Onset  .  Heart disease Mother   . Heart disease Father   . Anesthesia problems Neg Hx   . Hypotension Neg Hx   . Malignant hyperthermia Neg Hx   . Pseudochol deficiency Neg Hx     Prior to Admission medications   Medication Sig Start Date End Date Taking? Authorizing Provider  acetaminophen (TYLENOL) 500 MG tablet Take 1,000 mg by mouth every 6 (six) hours as needed for headache (pain).   Yes [provider]  apixaban (ELIQUIS) 5 MG TABS tablet Take 1 tablet (5 mg total) by mouth 2 (two) times daily. Patient taking differently: Take 5 mg by mouth 2 (two) times daily after a meal.  03/03/18 04/02/18 Yes Arrien, Jimmy Picket, MD  Ascorbic Acid (VITAMIN C PO) Take 1 tablet by mouth 2 (two) times daily.   Yes [provider]  atorvastatin (LIPITOR) 20 MG tablet Take 20 mg by mouth daily after supper.    Yes [provider]  carvedilol (COREG) 25 MG tablet Take 1 tablet (25 mg total) by mouth 2 (two) times daily. Patient taking differently: Take 25 mg by mouth 2 (two) times daily after a meal.  03/03/18 04/02/18 Yes Arrien, Jimmy Picket, MD  fenofibrate 54 MG tablet Take 1 tablet (54 mg total) by mouth daily. 03/03/18 04/02/18 Yes Arrien, Jimmy Picket, MD  gabapentin (NEURONTIN) 300 MG capsule Take 300 mg by mouth 2 (two) times daily after a meal.    Yes [provider]  levothyroxine (SYNTHROID, LEVOTHROID) 100 MCG tablet Take 100 mcg by mouth daily before breakfast.  01/07/18  Yes [provider]  methylcellulose (ARTIFICIAL TEARS) 1 % ophthalmic solution Place 1 drop into both eyes 2 (two) times daily as needed (dry eyes).   Yes [provider]  metolazone (ZAROXOLYN) 5 MG tablet Take 5 mg by mouth See admin instructions. Take one tablet (5 mg) by mouth when directed to do so by cardiologist.   Yes [provider]  Multiple Vitamin (MULTIVITAMIN WITH MINERALS) TABS tablet Take 1 tablet by mouth daily. 12/03/17  Yes Rhyne, Samantha J, PA-C    nitroGLYCERIN (NITROSTAT) 0.4 MG SL tablet PLACE 1 TABLET UNDER TONGUE EVERY 5 MINUTES AS NEEDED FOR CHEST PAIN. Patient taking differently: Place 0.4 mg under the tongue every 5 (five) minutes as needed for chest pain.  09/17/15  Yes Belva Crome, MD  oxymetazoline [facility]  SINEX) 0.05 % nasal spray Place 1 spray into both nostrils 2 (two) times daily as needed for congestion.   Yes [provider]  pantoprazole (PROTONIX) 40 MG tablet Take 1 tablet (40 mg total) by mouth daily. 03/11/18 03/11/19 Yes Pokhrel, Laxman, MD  pioglitazone (ACTOS) 30 MG tablet Take 1 tablet (30 mg total) by mouth daily with lunch. Patient taking differently: Take 30 mg by mouth daily after supper.  12/03/17  Yes Rhyne, Samantha J, PA-C  potassium chloride SA (K-DUR,KLOR-CON) 20 MEQ tablet Take by mouth as directed. Patient taking differently: Take 20 mEq by mouth See admin instructions. Take one tablet (20 meq) by mouth when directed to do so by MD 03/18/18  Yes Isaiah Serge, NP  pseudoephedrine-guaifenesin (MUCINEX D) 60-600 MG per tablet Take 1 tablet by mouth every 12 (twelve) hours as needed for congestion.    Yes [provider]  sodium chloride (OCEAN) 0.65 % SOLN nasal spray Place 1 spray into both nostrils 2 (two) times daily as needed for congestion.   Yes [provider]  tamsulosin (FLOMAX) 0.4 MG CAPS capsule Take 0.4 mg by mouth every Monday, Wednesday, and Friday.    Yes [provider]  torsemide (DEMADEX) 20 MG tablet Take 3 tablets (60 mg total) by mouth 2 (two) times daily. Patient taking differently: Take 60 mg by mouth 2 (two) times daily after a meal.  02/03/18  Yes Belva Crome, MD  vitamin B-12 (CYANOCOBALAMIN) 500 MCG tablet Take 500 mcg by mouth daily.   Yes [provider]  zinc gluconate 50 MG tablet Take 50 mg by mouth daily.   Yes [provider]  amiodarone (PACERONE) 200 MG tablet Take 1 tablet (200 mg total) by mouth 2 (two) times  daily. 03/17/18   Isaiah Serge, NP    Physical Exam: Ill-looking, lethargic in mild distress from shortness of breath. Vitals:   03/18/18 1215 03/18/18 1240 03/18/18 1405 03/18/18 1600  BP:  (!) 151/60 (!) 153/62 (!) 141/73  Pulse:  (!) 122 (!) 122 (!) 123  Resp:   (!) 26 (!) 28  Temp:  98.3 F (36.8 C)    TempSrc:  Oral    SpO2: 97% 93% 98% 96%    Constitutional: NAD, calm, comfortable Vitals:   03/18/18 1215 03/18/18 1240 03/18/18 1405 03/18/18 1600  BP:  (!) 151/60 (!) 153/62 (!) 141/73  Pulse:  (!) 122 (!) 122 (!) 123  Resp:   (!) 26 (!) 28  Temp:  98.3 F (36.8 C)    TempSrc:  Oral    SpO2: 97% 93% 98% 96%   Eyes: PERRL, lids and conjunctivae normal ENMT: Mucous membranes are moist. Neck: normal, supple, no masses, no thyromegaly Respiratory: Diminished at bases bilateral Rales present Cardiovascular: Tachycardic, irregular, JVD present, 3+ bilateral leg edema Abdomen: no tenderness, obese, bowel sounds good, nontender Musculoskeletal: no clubbing / cyanosis. Bilateral 3+ leg edema with  Weeping ulcers, bandaged Skin: no rashes, lesions, ulcers. No induration Neurologic: pt alert , oriented to place and person, able to answer all questions, overall strength 4/5.  Psychiatric:lethargic, sleepy but able to answer all questions appropriately.      Labs on Admission: I have personally reviewed following labs and imaging studies  CBC: Recent Labs  Lab 03/18/18 1325  WBC 8.6  NEUTROABS 7.5  HGB 8.8*  HCT 27.7*  MCV 88.8  PLT 673   Basic Metabolic Panel: Recent Labs  Lab 03/17/18 1627 03/18/18 1325  NA 132*  130*  K 3.2* 2.4*  CL 89* 92*  CO2 26 24  GLUCOSE 267* 224*  BUN 67* 75*  CREATININE 2.40* 2.51*  CALCIUM 8.7 8.2*  MG 2.0 1.9   GFR: Estimated Creatinine Clearance: 31.7 mL/min (A) (by C-G formula based on SCr of 2.51 mg/dL (H)). Liver Function Tests: Recent Labs  Lab 03/17/18 1627 03/18/18 1325  AST 20 26  ALT 11 13  ALKPHOS 40 32*    BILITOT 0.7 1.3*  PROT 6.3 5.6*  ALBUMIN 3.4* 2.5*   No results for input(s): LIPASE, AMYLASE in the last 168 hours. No results for input(s): AMMONIA in the last 168 hours. Coagulation Profile: No results for input(s): INR, PROTIME in the last 168 hours. Cardiac Enzymes: Recent Labs  Lab 03/18/18 1325  TROPONINI 0.03*   BNP (last 3 results) Recent Labs    02/03/18 1217 02/24/18 1243  PROBNP 10,666* 17,251*   HbA1C: No results for input(s): HGBA1C in the last 72 hours. CBG: No results for input(s): GLUCAP in the last 168 hours. Lipid Profile: No results for input(s): CHOL, HDL, LDLCALC, TRIG, CHOLHDL, LDLDIRECT in the last 72 hours. Thyroid Function Tests: Recent Labs    03/18/18 1325  TSH 3.150   Anemia Panel: No results for input(s): VITAMINB12, FOLATE, FERRITIN, TIBC, IRON, RETICCTPCT in the last 72 hours. Urine analysis:    Component Value Date/Time   COLORURINE YELLOW 11/15/2017 1004   APPEARANCEUR CLEAR 11/15/2017 1004   LABSPEC 1.008 11/15/2017 1004   PHURINE 6.0 11/15/2017 1004   GLUCOSEU NEGATIVE 11/15/2017 1004   HGBUR MODERATE (A) 11/15/2017 1004   BILIRUBINUR NEGATIVE 11/15/2017 1004   KETONESUR NEGATIVE 11/15/2017 1004   PROTEINUR NEGATIVE 11/15/2017 1004   UROBILINOGEN 4.0 (H) 09/23/2011 1335   NITRITE NEGATIVE 11/15/2017 1004   LEUKOCYTESUR SMALL (A) 11/15/2017 1004    Radiological Exams on Admission: Dg Chest 2 View  Result Date: 03/18/2018 CLINICAL DATA:  77 year old male with a history of generalized weakness for 2 days EXAM: CHEST - 2 VIEW COMPARISON:  02/28/2018 FINDINGS: Cardiomediastinal silhouette unchanged with cardiomegaly and atherosclerosis of the aorta. Surgical changes of median sternotomy. Interlobular septal thickening with fullness of the vasculature. Blunting at the costophrenic angle and cardiophrenic angle. Minimal meniscus on the lateral view. No displaced fracture. IMPRESSION: Early CHF with small right pleural effusion.  Surgical changes of median sternotomy Electronically Signed   By: Corrie Mckusick D.O.   On: 03/18/2018 13:40    EKG: Independently reviewed.  Atrial flutter at 120/min  Assessment/Plan Active Problems:   Acute CHF (congestive heart failure) (HCC)  Acute on chronic systolic and diastolic heart failure Admit for IV diuresis.  Cardiology consulted and they recommended 120 mg of IV Lasix twice daily.  Continue with strict ins and outs.  Daily weights. Monitor renal para meters and electrolytes while on IV Lasix.  Last echocardiogram in September 2019 shows left ventricular ejection fraction reduced to 40% with anterior hypokinesis with high left ventricular filling pressures with grade 2 diastolic dysfunction.   Resume coreg and Lipitor.    Atrial flutter Restart amiodarone 200 mg twice daily, resume Eliquis twice daily.  Resume Coreg.   Hypokalemia replaced Recheck potassium tonight and replace as needed.   Hyponatremia possibly from fluid overload.  Type 2 diabetes mellitus uncontrolled with hyperglycemia Holding Actos at this time Resume with sliding scale insulin.    Hyperlipidemia Resume Lipitor.   Anemia of chronic disease With recent history of GI bleed, transfuse to keep hemoglobin greater than 8.  History of coronary artery disease status post CABG Patient currently denies any chest pain.  PRN nitroglycerin as needed.   Acute on stage III CKD Suspect probably from fluid overload.  Start started IV  Lasix, monitor renal function while on Lasix.  Bilateral lower extremity edema with weeping Wound care consult placed   DVT prophylaxis: Eliquis Code Status: Full code Family Communication: Wife at bedside Disposition Plan: Pending clinical improvement Consults called: cardiology consulted.  Admission status: inpatient/ step down.    Hosie Poisson MD [facility] Pager [phone]  If 7PM-7AM, please contact night-coverage [email or web address] Password  [facility]  03/18/2018, 5:23 PM

## 2018-03-18 NOTE — Consult Note (Addendum)
Cardiology Consultation:   Patient ID: BANNON GIAMMARCO MRN: [id number]; DOB: 1940-08-23  Admit date: 03/18/2018 Date of Consult: 03/18/2018  Primary Care Provider: Lavone Orn, MD Primary Cardiologist: Sinclair Grooms, MD  Primary Electrophysiologist:  None    Patient Profile:   LADAINIAN THERIEN is a 77 y.o. male with multiple comorbidities including chronic combined systolic and diastolic heart failure, newly developed atrial fibrillation, recent GI bleeding, severe PAD, mild memory impairment, CAD with prior coronary grafting and recent non-ST elevation MI (suspect bypass graft occlusion), hypertension, type 2 diabetes, CKD III-IV, and recent lower extremity cellulitis from superinfection of weeping lower extremities related to heart failure and recent admission for CHF 10/14-10/18, presenting back to ED with recurrent CHF exacerbation, atrial flutter w/ RVR and profound generalized weakness. Cardiology consulted by Dr. Karleen Hampshire, Internal Medicine.   History of Present Illness:   Mr. Grosso  Is a 77 y/o WM, followed by Dr. Tamala Julian, presenting to the ED with generalized weakness and acute on chronic combined systolic and diastolic HF with marked volume overload.   He has multiple comorbidities including chronic combined systolic and diastolic heart failure, newly developed atrial fibrillation, recent GI bleeding, severe PAD, mild memory impairment, CAD with prior coronary grafting and recent non-ST elevation MI (suspect bypass graft occlusion), hypertension, type 2 diabetes, CKD III-IV, and recent lower extremity cellulitis from superinfection of weeping lower extremities related to heart failure.  He was admitted earlier this month for acute on chronic HF and cellulitis. He developed new onset atrial fibrillation during that admission and started on Eliquis for a/c. Echo showed newly diminished ejection fraction (40-45%, previously 50-55%) and WMA w/ more significant anterior  hypokinesison echo.Troponin however was negative and given his renal function with spike in SCr to >2, decision was made to treat medically and not pursue cath. He was diuresed with IV Lasix and transitioned to PO diuretic. PO diuretic changed from Lasix changed to Torsemide. Also of importance he had recent + FOBT and underwent EGD on 10/17 which showed gastritis but no discrete source of bleeding. He was started on a PPI and GI cleared to resume Eliquis. Per GI, if recurrent bleeding, could consider capsule endoscopy. His Hgb at time of endoscope was 10.1 (improved from 8.8 after transfusion x 1).  Since hospital discharged he has had more fluid retention and weight gain and required several diuretic dose adjustments in the office. He reports full med compliance but wife admits to dietary indiscretion with sodium (loves [place] food).  He was seen again in clinic yesterday. He had gained additional weight. Noted to be volume overloaded w/ significant bilateral LEE and elevated JVD. He was noted to be in afib w/ RVR. Dr. Tamala Julian recommended hospital readmission but pt refused and wanted to readjust his outpatient regimen to see if any improvment. Dr. Tamala Julian elected to start him on amiodarone 200 mg daily for afib, as he felt this was contributing to his recurrent CHF and advised addition of metolazone.  Today pt developed profound generalized weakness and he decided to come to the ED. He does not appear to be in any significant respiratory distress. Normal work of breathing but on supplemental O2 via [place]. He has significant bilateral LEE, pitting and weeping edema. His wife notes that he has occasionally complained of mild substernal chest tightness but not severe pr prolonged enough to require use of SL NTG.   In the ED, BPN is 2,440, up from 1,809 at time of last admit for CHF. TSH  is WNL. CMP notable for hyperkalemia at 2.4, hyponatremia w/ sodium at 130. SCr 2.51. BUN 75. Hypoalbuminemia at 2.5. Glucose  elevated at 225. Initial troponin 0.03. EKG shows atrial flutter w/ RVR and 2:1 conduction. Ventricular rates in the 120s. BP 141/73.   Past Medical History:  Diagnosis Date  . Arthritis   . Atherosclerotic heart disease   . Back pain   . Bruises easily    takes Pletal daily and ASA 325mg  daily  . Cataract immature    right  . Chest pain   . Colon polyps   . Complication of anesthesia    hard to wake up with bypass surgery  . Cyst    on perineum;takes Doxycycline daily  . Diabetes mellitus    takes Glipizide,Metformin,and Actos daily  . Diarrhea   . Dyspnea    when lying flat  . Edema    right leg knee down swollen since fall 3 wks ago  . Foot drop, left   . GERD (gastroesophageal reflux disease)    Rolaids as needed-occ reflux  . Hemorrhoids   . History of kidney stones    at age 49   . Hyperlipidemia    takes Tricor and Lipitor daily  . Hypertension    takes Altace,Amlodipine,and Nadolol daily  . Hypothyroidism   . Kidney stone    35+yrs ago  . Muscle pain   . Nasal polyps    hx of  . Peripheral edema    wears knee length hose-told by podiatrist to wear these  . Peripheral neuropathy    takes Gabapentin daily  . Pneumonia    as a child  . PVD (peripheral vascular disease) ([place])   . Renal insufficiency   . Seasonal allergies    takes Mucinex and Zyrtec prn  . Urinary frequency    urgency/takes Vesicare daily    Past Surgical History:  Procedure Laterality Date  . Aortobifemoral bypass    . APPLICATION OF WOUND VAC Left 10/25/2017   Procedure: Wound vac placement to left groin.;  Surgeon: Elam Dutch, MD;  Location: [facility] OR;  Service: Vascular;  Laterality: Left;  . APPLICATION OF WOUND VAC Left 11/10/2017   Procedure: APPLICATION OF WOUND VAC;  Surgeon: Elam Dutch, MD;  Location: [place];  Service: Vascular;  Laterality: Left;  . CARDIAC CATHETERIZATION  most recent 05/2011   total of 4  . CAROTID ANGIOGRAM N/A 06/05/2011   Procedure: CAROTID  ANGIOGRAM;  Surgeon: Elam Dutch, MD;  Location: [facility] CATH LAB;  Service: Cardiovascular;  Laterality: N/A;  . CAROTID ENDARTERECTOMY  04/08/2011   left CEA  . cataract surgery Bilateral    left  . COLONOSCOPY    . COLONOSCOPY WITH PROPOFOL N/A 08/22/2013   Procedure: COLONOSCOPY WITH PROPOFOL;  Surgeon: Garlan Fair, MD;  Location: [facility] ENDOSCOPY;  Service: Endoscopy;  Laterality: N/A;  . CORONARY ARTERY BYPASS GRAFT  09/09/2011   Procedure: CORONARY ARTERY BYPASS GRAFTING (CABG);  Surgeon: Gaye Pollack, MD;  Location: [place];  Service: Open Heart Surgery;  Laterality: N/A;  Coronary artery bypass grafting  x three with Right saphenous vein harvested endoscopically and left internal mammary artery  . ENDARTERECTOMY  04/08/2011   Procedure: ENDARTERECTOMY CAROTID;  Surgeon: Elam Dutch, MD;  Location: [facility] OR;  Service: Vascular;  Laterality: Left;  with patch angioplasty  . ENDARTERECTOMY  09/09/2011   Procedure: ENDARTERECTOMY CAROTID;  Surgeon: Elam Dutch, MD;  Location: [place];  Service: Vascular;  Laterality: Right;  with patch angioplasty  . ESOPHAGOGASTRODUODENOSCOPY (EGD) WITH PROPOFOL N/A 03/10/2018   Procedure: ESOPHAGOGASTRODUODENOSCOPY (EGD) WITH PROPOFOL;  Surgeon: Arta Silence, MD;  Location: [place];  Service: Endoscopy;  Laterality: N/A;  . FEMORAL-POPLITEAL BYPASS GRAFT Left 10/25/2017   Procedure: Left Common Femoral Endarterectomy and perfundaplasty, Left BYPASS GRAFT FEMORAL-BELOW KNEE POPLITEAL ARTERY.;  Surgeon: Elam Dutch, MD;  Location: [place];  Service: Vascular;  Laterality: Left;  . GROIN DEBRIDEMENT Left 11/10/2017   Procedure: GROIN DEBRIDEMENT;  Surgeon: Elam Dutch, MD;  Location: [place];  Service: Vascular;  Laterality: Left;  . LEFT HEART CATHETERIZATION WITH CORONARY ANGIOGRAM N/A 07/09/2011   Procedure: LEFT HEART CATHETERIZATION WITH CORONARY ANGIOGRAM;  Surgeon: Sinclair Grooms, MD;  Location: [facility] CATH LAB;  Service: Cardiovascular;   Laterality: N/A;  . LOWER EXTREMITY ANGIOGRAPHY N/A 10/11/2017   Procedure: LOWER EXTREMITY ANGIOGRAPHY;  Surgeon: Waynetta Sandy, MD;  Location: [place] CV LAB;  Service: Cardiovascular;  Laterality: N/A;  . Reimplantation of inferior mesenteric artery    . Repair of infrarenal abdominal aortic aneurysm    . SHOULDER ARTHROSCOPY W/ ROTATOR CUFF REPAIR     right and left-open procedures  . TONSILLECTOMY     at age 65  . TRIGGER FINGER RELEASE Right 06/28/2014   Procedure: RIGHT LONG TRIGGER RELEASE ;  Surgeon: Leanora Cover, MD;  Location: [place];  Service: Orthopedics;  Laterality: Right;  . wisdom teeth extracted     as a teenager  . WOUND DEBRIDEMENT Left 11/05/2017   Procedure: DEBRIDEMENT WOUND LEFT GROIN with wound vac placement;  Surgeon: Elam Dutch, MD;  Location: [facility] OR;  Service: Vascular;  Laterality: Left;     Home Medications:  Prior to Admission medications   Medication Sig Start Date End Date Taking? Authorizing Provider  acetaminophen (TYLENOL) 500 MG tablet Take 1,000 mg by mouth every 6 (six) hours as needed for headache (pain).   Yes [provider]  apixaban (ELIQUIS) 5 MG TABS tablet Take 1 tablet (5 mg total) by mouth 2 (two) times daily. Patient taking differently: Take 5 mg by mouth 2 (two) times daily after a meal.  03/03/18 04/02/18 Yes Arrien, Jimmy Picket, MD  Ascorbic Acid (VITAMIN C PO) Take 1 tablet by mouth 2 (two) times daily.   Yes [provider]  atorvastatin (LIPITOR) 20 MG tablet Take 20 mg by mouth daily after supper.    Yes [provider]  carvedilol (COREG) 25 MG tablet Take 1 tablet (25 mg total) by mouth 2 (two) times daily. Patient taking differently: Take 25 mg by mouth 2 (two) times daily after a meal.  03/03/18 04/02/18 Yes Arrien, Jimmy Picket, MD  fenofibrate 54 MG tablet Take 1 tablet (54 mg total) by mouth daily. 03/03/18 04/02/18 Yes Arrien, Jimmy Picket, MD  gabapentin  (NEURONTIN) 300 MG capsule Take 300 mg by mouth 2 (two) times daily after a meal.    Yes [provider]  levothyroxine (SYNTHROID, LEVOTHROID) 100 MCG tablet Take 100 mcg by mouth daily before breakfast.  01/07/18  Yes [provider]  methylcellulose (ARTIFICIAL TEARS) 1 % ophthalmic solution Place 1 drop into both eyes 2 (two) times daily as needed (dry eyes).   Yes [provider]  metolazone (ZAROXOLYN) 5 MG tablet Take 5 mg by mouth See admin instructions. Take one tablet (5 mg) by mouth when directed to do so by cardiologist.   Yes [provider]  Multiple Vitamin (  MULTIVITAMIN WITH MINERALS) TABS tablet Take 1 tablet by mouth daily. 12/03/17  Yes Rhyne, Samantha J, PA-C  nitroGLYCERIN (NITROSTAT) 0.4 MG SL tablet PLACE 1 TABLET UNDER TONGUE EVERY 5 MINUTES AS NEEDED FOR CHEST PAIN. Patient taking differently: Place 0.4 mg under the tongue every 5 (five) minutes as needed for chest pain.  09/17/15  Yes Belva Crome, MD  oxymetazoline (VICKS SINEX) 0.05 % nasal spray Place 1 spray into both nostrils 2 (two) times daily as needed for congestion.   Yes [provider]  pantoprazole (PROTONIX) 40 MG tablet Take 1 tablet (40 mg total) by mouth daily. 03/11/18 03/11/19 Yes Pokhrel, Laxman, MD  pioglitazone (ACTOS) 30 MG tablet Take 1 tablet (30 mg total) by mouth daily with lunch. Patient taking differently: Take 30 mg by mouth daily after supper.  12/03/17  Yes Rhyne, Samantha J, PA-C  potassium chloride SA (K-DUR,KLOR-CON) 20 MEQ tablet Take by mouth as directed. Patient taking differently: Take 20 mEq by mouth See admin instructions. Take one tablet (20 meq) by mouth when directed to do so by MD 03/18/18  Yes Isaiah Serge, NP  pseudoephedrine-guaifenesin (MUCINEX D) 60-600 MG per tablet Take 1 tablet by mouth every 12 (twelve) hours as needed for congestion.    Yes [provider]  sodium chloride (OCEAN) 0.65 % SOLN nasal spray Place 1 spray  into both nostrils 2 (two) times daily as needed for congestion.   Yes [provider]  tamsulosin (FLOMAX) 0.4 MG CAPS capsule Take 0.4 mg by mouth every Monday, Wednesday, and Friday.    Yes [provider]  torsemide (DEMADEX) 20 MG tablet Take 3 tablets (60 mg total) by mouth 2 (two) times daily. Patient taking differently: Take 60 mg by mouth 2 (two) times daily after a meal.  02/03/18  Yes Belva Crome, MD  vitamin B-12 (CYANOCOBALAMIN) 500 MCG tablet Take 500 mcg by mouth daily.   Yes [provider]  zinc gluconate 50 MG tablet Take 50 mg by mouth daily.   Yes [provider]  amiodarone (PACERONE) 200 MG tablet Take 1 tablet (200 mg total) by mouth 2 (two) times daily. 03/17/18   Isaiah Serge, NP    Inpatient Medications: Scheduled Meds: . furosemide  40 mg Intravenous Q12H  . potassium chloride  40 mEq Oral BID   Continuous Infusions: . potassium chloride     PRN Meds:   Allergies:    Allergies  Allergen Reactions  . Amoxicillin-Pot Clavulanate Nausea Only    Has patient had a PCN reaction causing immediate rash, facial/tongue/throat swelling, SOB or lightheadedness with hypotension: No Has patient had a PCN reaction causing severe rash involving mucus membranes or skin necrosis: No Has patient had a PCN reaction that required hospitalization: No Has patient had a PCN reaction occurring within the last 10 years: No If all of the above answers are "NO", then may proceed with Cephalosporin use.   [place] Kitchen Morphine And Related Other (See Comments)    hallucinations  . Percocet [Oxycodone-Acetaminophen] Other (See Comments)    Agitation and Increased Confusion. Family asks to please not order this medication for him.   . Restoril [Temazepam] Other (See Comments)    Confusion and Agitation. Has taken in past, but has not reacted well. Family requests to not give this mediation.     Social History:   Social History   Socioeconomic History    . Marital status: Married    Spouse name: Not on file  .  Number of children: 2  . Years of education: Not on file  . Highest education level: Professional school degree (e.g., MD, DDS, DVM, JD)  Occupational History  . Occupation: retired Chief Executive Officer  Social Needs  . Financial resource strain: Not on file  . Food insecurity:    Worry: Not on file    Inability: Not on file  . Transportation needs:    Medical: Not on file    Non-medical: Not on file  Tobacco Use  . Smoking status: Former Smoker    Years: 40.00    Types: Cigarettes    Last attempt to quit: 10/27/2005    Years since quitting: 12.3  . Smokeless tobacco: Never Used  Substance and Sexual Activity  . Alcohol use: Yes    Alcohol/week: 2.0 - 3.0 standard drinks    Types: 2 - 3 Cans of beer per week    Comment: occasional- weekly  . Drug use: No  . Sexual activity: Never  Lifestyle  . Physical activity:    Days per week: Not on file    Minutes per session: Not on file  . Stress: Not on file  Relationships  . Social connections:    Talks on phone: Patient refused    Gets together: Patient refused    Attends religious service: Patient refused    Active member of club or organization: Patient refused    Attends meetings of clubs or organizations: Patient refused    Relationship status: Patient refused  . Intimate partner violence:    Fear of current or ex partner: Patient refused    Emotionally abused: Patient refused    Physically abused: Patient refused    Forced sexual activity: Patient refused  Other Topics Concern  . Not on file  Social History Narrative   He lives with wife in a 1 story home, three steps to entire home.  2 children.  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      Family History:    Family History  Problem Relation Age of Onset  . Heart disease Mother   . Heart disease Father   . Anesthesia problems Neg Hx   . Hypotension Neg Hx   . Malignant hyperthermia Neg Hx   . Pseudochol deficiency  Neg Hx      ROS:  Please see the history of present illness.   All other ROS reviewed and negative.     Physical Exam/Data:   Vitals:   03/18/18 1215 03/18/18 1240 03/18/18 1405 03/18/18 1600  BP:  (!) 151/60 (!) 153/62 (!) 141/73  Pulse:  (!) 122 (!) 122 (!) 123  Resp:   (!) 26 (!) 28  Temp:  98.3 F (36.8 C)    TempSrc:  Oral    SpO2: 97% 93% 98% 96%   No intake or output data in the 24 hours ending 03/18/18 1743 There were no vitals filed for this visit. There is no height or weight on file to calculate BMI.   General:  Obese elderly WM, looks pale and tired, but in no acute respiratory distress HEENT: normal Lymph: no adenopathy Neck: +  JVD just below the level of the ear Endocrine:  No thryomegaly Vascular: No carotid bruits; FA pulses 2+ bilaterally without bruits  Cardiac:  Irregular rhythm, tachy rate Lungs:  clear to auscultation bilaterally, no wheezing, rhonchi or rales  Abd: soft, nontender, no hepatomegaly  Ext: no 3+ bilateral LEE pitting edema/ weeping ulcers w/ bandages present Musculoskeletal:  No deformities, BUE and  BLE strength normal and equal Skin: warm and dry  Neuro:  CNs 2-12 intact, no focal abnormalities noted Psych:  Normal affect    EKG:  The ECG that was done 03/18/18 was personally reviewed and demonstrates atrial flutter w/ RVR and 2:1 conduction   Relevant CV Studies: 2D Echo 02/09/18 Study Conclusions  - Left ventricle: The cavity size was normal. There was moderate concentric hypertrophy. Systolic function was mildly to moderately reduced. The estimated ejection fraction was in the range of 40% to 45%. Anterior hypokinesis. Grade 2 DD with high LV filling pressure. - Aortic valve: Calcified without stenosis. Mean gradient (S): 6 mm Hg. - Left atrium: Moderately dilated. - Right atrium: The atrium was mildly dilated. - Inferior vena cava: The vessel was normal in size. The respirophasic diameter changes were in  the normal range (>= 50%), consistent with normal central venous pressure.  Impressions:  - Technically difficult study. LVEF 40-45%, more significant anterior hypokinesis, grade 2 DD, high LV filling pressure, moderate LAE, mild RAE.  Laboratory Data:  Chemistry Recent Labs  Lab 03/17/18 1627 03/18/18 1325  NA 132* 130*  K 3.2* 2.4*  CL 89* 92*  CO2 26 24  GLUCOSE 267* 224*  BUN 67* 75*  CREATININE 2.40* 2.51*  CALCIUM 8.7 8.2*  GFRNONAA 25* 23*  GFRAA 29* 27*  ANIONGAP  --  14    Recent Labs  Lab 03/17/18 1627 03/18/18 1325  PROT 6.3 5.6*  ALBUMIN 3.4* 2.5*  AST 20 26  ALT 11 13  ALKPHOS 40 32*  BILITOT 0.7 1.3*   Hematology Recent Labs  Lab 03/18/18 1325  WBC 8.6  RBC 3.12*  HGB 8.8*  HCT 27.7*  MCV 88.8  MCH 28.2  MCHC 31.8  RDW 16.4*  PLT 235   Cardiac Enzymes Recent Labs  Lab 03/18/18 1325  TROPONINI 0.03*   No results for input(s): TROPIPOC in the last 168 hours.  BNP Recent Labs  Lab 03/18/18 1325  BNP 2,440.0*    DDimer No results for input(s): DDIMER in the last 168 hours.  Radiology/Studies:  Dg Chest 2 View  Result Date: 03/18/2018 CLINICAL DATA:  77 year old male with a history of generalized weakness for 2 days EXAM: CHEST - 2 VIEW COMPARISON:  02/28/2018 FINDINGS: Cardiomediastinal silhouette unchanged with cardiomegaly and atherosclerosis of the aorta. Surgical changes of median sternotomy. Interlobular septal thickening with fullness of the vasculature. Blunting at the costophrenic angle and cardiophrenic angle. Minimal meniscus on the lateral view. No displaced fracture. IMPRESSION: Early CHF with small right pleural effusion. Surgical changes of median sternotomy Electronically Signed   By: Corrie Mckusick D.O.   On: 03/18/2018 13:40    Assessment and Plan:    CALLAGHAN LAVERDURE is a 77 y.o. male with multiple comorbidities including chronic combined systolic and diastolic heart failure, newly developed atrial  fibrillation, recent GI bleeding, severe PAD, mild memory impairment, CAD with prior coronary grafting and recent non-ST elevation MI (suspect bypass graft occlusion), hypertension, type 2 diabetes, CKD III-IV, and recent lower extremity cellulitis from superinfection of weeping lower extremities related to heart failure and recent admission for CHF 10/14-10/18, presenting back to ED with recurrent CHF exacerbation, atrial flutter w/ RVR and profound generalized weakness. Cardiology consulted by Dr. Karleen Hampshire, Internal Medicine.   1. Acute on Chronic Combined Systolic and Diastolic CHF: EF reduced at 40-45% on recent echo + G2DD. Markedly volume overloaded on exam, although some of his LEE may be 2/2 to hypoalbuminemia as well as  his albumin is low at 2.5. BNP elevated at 2,440, much higher than previous admit 3 weeks ago when BNP was 1,809. Also with recent decline in renal function. ? Cardiorenal syndrome. He will be admitted and started on IV diuretics. He was on 60 mg of Torsemide BID which is = to 120 mg of Lasix BID. We will start him on 120 mg of IV Lasix BID + potassium supplementation. Strict I/Os, daily weights + low sodium diet. Reassess response in the AM. Continue ? blocker therapy w/ Coreg. No ACE/ARB due to renal function. He is scheduled for outpatient AHF clinic consult next month, but we will ask the AHF to see this admission.   2. Atrial Fibrillation/ Atrial Flutter w/ RVR: new diagnosis of atrial fibrillation made previous admit 3 weeks ago. Eliquis was initiated for a/c. He is now in atrial flutter w/ RVR and worsening HF. TSH WNL. K low at 2.4 and needs repletion. Mg WNL.  Amiodarone was initiated by Dr. Tamala Julian yesterday in the office. Will continue amiodarone 200 mg BID along with Coreg 25 mg BID. He has chronic anemia, which may be 2/2 CKD. Hgb 8.8 today. Had upper endoscopy w/ GI last week and cleared to resume Eliquis as no visible source of bleeding was identified. We will continue  Eliquis for now. No ASA. Monitor H/H. May consider TEE guided DCCV if unable to control HR, as his tachy atrial arrhthymias are likely contributing to his CHF.   3. CKD: SCr elevated at 2.51. His SCr spiked at 2.4 prior admit 3 weeks ago but had improved down to 1.91 on 10/9. This may be cardiorenal syndrome. He needs diuresis for acute on chronic CHF. Monitor renal function closely.   4. Hypokalemia: 2.4 in ED. Mg is WNL. This is likely contributing to his weakness. He has been on PO torsemide 60 mg BID and took one does of Metolazone 2 days ago. Order supplemental K for repletion. Hold Metoalzone. He will need IV Lasix for diuresis and close monitoring of K. Order daily BMPs for monitoring of electrolytes.   5. HTN: BP elevated but in the setting volume overload. Treat w/ IV diuretics. Continue home antihypertensives. Monitor.  6. CAD: H/o CABG. Recent echo showed newly reduced EF down to 40-45% and new WMA. Suspect possible graft failure, but due to abnormal renal function, he not a candidate for cardiac cath at this time. Continue medical therapy.   7. DM: given CHF, recommend holding his Actos. Treat w/ SSI. He will need f/u with PCP post discharge to adjust outpatient regimen.   MD to follow with further recommendations.   For questions or updates, please contact [place] Please consult [email or web address] for contact info under     Signed, Lyda Jester, PA-C  03/18/2018 5:43 PM  Personally seen and examined. Agree with above.  77 year old with complex medical history here with acute on chronic systolic heart failure, lower extremity weeping wounds, chronic kidney disease stage IV with diabetes, CAD with possible suspected graft occlusion with atrial flutter 2-1 conduction.  Laying in bed, wife at bedside.  Blankets covering him.  Moderately short of breath.  Telemetry reveals atrial flutter 120 bpm personally reviewed with interventricular conduction delay.  EKG concurs.   Came in with profound weakness.  Potassium was found to be 2.4.  Received metolazone recently.  Is on torsemide 60 twice daily.  Daughter is a Marine scientist.  On exam: Slightly disheveled, elderly male, ill-appearing, moderate neck JVD, tachycardic regular with 1/6 systolic murmur  right upper sternal border, lungs with rales bilaterally abdomen protuberant soft, lower extremity weeping wounds dressed with wet gauze.  Neurologically nonfocal.  Lab work personally reviewed-creatinine 2.5, potassium 2.5  Assessment and plan:  Acute on chronic systolic heart failure, stage D NYHA class IV -IV Lasix 120 mg twice daily.,  Additional if necessary. -Be careful with metolazone is administered with recent hypokalemia. -Continue with carvedilol 25 mg twice a day. - Wife understands the severity of his illness especially with recurrent hospitalizations.  His daughter is a Marine scientist.  Will need to have further discussion about end-stage heart failure and end-of-life care as time progresses. -Continue with salt restriction, likes [place] food for instance.  Hypokalemia -Severe likely contributing to his profound weakness.  IV potassium as well as p.o. Potassium.  Coronary artery disease - Possible bypass graft failure.  Not a cardiac catheterization candidate at this point given his elevated creatinine and overall poor generalized condition.  Continue with aggressive secondary prevention.  Atrial flutter - Start amiodarone 200 mg twice a day.  Watch carefully with right bundle branch block for signs of bradycardia.  It is quite plausible that his rapid ventricular response is also precipitating his current heart failure episode.  Recent GI bleed -GI stated that it was okay to resume Eliquis.  His hemoglobin is currently in the 8 range.  Carefully monitor.  Lower extremity wounds -Wound care.  Diabetes with hypertension and chronic kidney disease stage IV - Avoid Actos  We will follow along  Candee Furbish,  MD

## 2018-03-18 NOTE — Telephone Encounter (Signed)
-----   Message from Isaiah Serge, NP sent at 03/18/2018  8:11 AM EDT ----- [name] pt needs 40 meq of kdur today and he has labs for Monday.  Take 20 meq Kdur on Sunday with metolazone.   Dr. Tamala Julian Cr up ? Due to volume overload he should have labs at PCP on Monday.  I replaced K+  Any other recommendations?

## 2018-03-18 NOTE — ED Triage Notes (Signed)
Per [facility], pt complains of generalized weakness x 2 days. Wife states that this has been on and off recently. One day the pt can ambulate without difficulty and the next pt is very weak. VSS. Axox4.

## 2018-03-18 NOTE — Telephone Encounter (Signed)
Pt's wife called and he did not do well last pm, up all night, weak.  They would like to be admitted, no beds available at [facility] currently, they were agreeable to go to ER.  Triage nurse notified and card master.

## 2018-03-18 NOTE — H&P (Deleted)
  Entered in error. See consult note.  

## 2018-03-18 NOTE — ED Provider Notes (Signed)
[facility] EMERGENCY DEPARTMENT Provider Note   CSN: [id number] Arrival date & time: 03/18/18  1214     History   Chief Complaint No chief complaint on file.   HPI [name] is a 77 y.o. male.  HPI  The patient is a 77 year old male, he has a history of multiple medical problems including diabetes, has a history of hemorrhoids, hypertension, history of atrial fibrillation and is currently anticoagulated on oral anticoagulants as well as aspirin.  The patient had been recently admitted to the hospital when he had increasing peripheral edema, was found to be progressively anemic and required a unit of blood, increasing diuresis and ultimately was discharged.  He followed up with his cardiologist yesterday, Dr. Tamala Julian, who initially recommended that he be admitted to the hospital for IV diuresis however the patient and the family member wanted to go home instead.  After being up last night uncomfortable, weak and being now unable to walk because of generalized weakness they recommended that he come to the hospital for IV diuresis and further evaluation.  The patient denies having any overt bleeding, he had an endoscopy during the prior visit which showed no obvious source of bleeding.  There is no dysuria, no new rashes on the skin, no fevers or chills.  His wife has an additional historian reports that he has had a progressive generalized weakness is now having even trouble walking.  He had a graceful fall yesterday when he got so weak that he collapsed onto the bed, his wife helped him down, there is no loss of consciousness.  At this time the patient states his symptoms are primarily weakness.  He does not appear in distress  Past Medical History:  Diagnosis Date  . Arthritis   . Atherosclerotic heart disease   . Back pain   . Bruises easily    takes Pletal daily and ASA 325mg  daily  . Cataract immature    right  . Chest pain   . Colon polyps   .  Complication of anesthesia    hard to wake up with bypass surgery  . Cyst    on perineum;takes Doxycycline daily  . Diabetes mellitus    takes Glipizide,Metformin,and Actos daily  . Diarrhea   . Dyspnea    when lying flat  . Edema    right leg knee down swollen since fall 3 wks ago  . Foot drop, left   . GERD (gastroesophageal reflux disease)    Rolaids as needed-occ reflux  . Hemorrhoids   . History of kidney stones    at age 32   . Hyperlipidemia    takes Tricor and Lipitor daily  . Hypertension    takes Altace,Amlodipine,and Nadolol daily  . Hypothyroidism   . Kidney stone    35+yrs ago  . Muscle pain   . Nasal polyps    hx of  . Peripheral edema    wears knee length hose-told by podiatrist to wear these  . Peripheral neuropathy    takes Gabapentin daily  . Pneumonia    as a child  . PVD (peripheral vascular disease) ([place])   . Renal insufficiency   . Seasonal allergies    takes Mucinex and Zyrtec prn  . Urinary frequency    urgency/takes Vesicare daily    Patient Active Problem List   Diagnosis Date Noted  . Symptomatic anemia 03/08/2018  . Atrial fibrillation ([place]) 03/08/2018  . Hyperlipidemia 03/08/2018  . Chronic back pain 03/08/2018  .  Combined systolic and diastolic heart failure ([place]) 03/08/2018  . GI bleed 03/07/2018  . Coronary artery disease involving coronary bypass graft of native heart without angina pectoris   . Benign essential HTN   . PAD (peripheral artery disease) ([place])   . New onset atrial fibrillation ([place])   . Tachypnea   . Hyponatremia   . Malnutrition of moderate degree 02/27/2018  . Volume overload 11/10/2017  . Surgical wound infection 11/04/2017  . Polyneuropathy associated with underlying disease ([place]) 08/16/2017  . Essential hypertension 02/06/2014  . Acute on chronic combined systolic and diastolic CHF (congestive heart failure) ([place]) 02/06/2014  . Chronic kidney disease, stage III (moderate) ([place]) 02/06/2014  . Aftercare  following surgery of the circulatory system, [place] 01/25/2014  . Carotid stenosis 06/08/2013  . Stricture of artery ([place]) 04/14/2012  . Peripheral vascular disease, unspecified ([place]) 10/08/2011  . DVT of upper extremity (deep vein thrombosis), left 09/29/2011  . S/P CABG (coronary artery bypass graft) 09/29/2011  . H/O carotid endarterectomy 09/29/2011  . Occlusion and stenosis of carotid artery without mention of cerebral infarction 06/11/2011  . Diabetes mellitus type 2 with complications ([place]) 27/07/5007    Past Surgical History:  Procedure Laterality Date  . Aortobifemoral bypass    . APPLICATION OF WOUND VAC Left 10/25/2017   Procedure: Wound vac placement to left groin.;  Surgeon: Elam Dutch, MD;  [facility];  Service: Vascular;  Laterality: Left;  . APPLICATION OF WOUND VAC Left 11/10/2017   Procedure: APPLICATION OF WOUND VAC;  Surgeon: Elam Dutch, MD;  Location: [place];  Service: Vascular;  Laterality: Left;  . CARDIAC CATHETERIZATION  most recent 05/2011   total of 4  . CAROTID ANGIOGRAM N/A 06/05/2011   Procedure: CAROTID ANGIOGRAM;  Surgeon: Elam Dutch, MD;  Location: [facility] CATH LAB;  Service: Cardiovascular;  Laterality: N/A;  . CAROTID ENDARTERECTOMY  04/08/2011   left CEA  . cataract surgery Bilateral    left  . COLONOSCOPY    . COLONOSCOPY WITH PROPOFOL N/A 08/22/2013   Procedure: COLONOSCOPY WITH PROPOFOL;  Surgeon: Garlan Fair, MD;  Location: [facility] ENDOSCOPY;  Service: Endoscopy;  Laterality: N/A;  . CORONARY ARTERY BYPASS GRAFT  09/09/2011   Procedure: CORONARY ARTERY BYPASS GRAFTING (CABG);  Surgeon: Gaye Pollack, MD;  Location: [place];  Service: Open Heart Surgery;  Laterality: N/A;  Coronary artery bypass grafting  x three with Right saphenous vein harvested endoscopically and left internal mammary artery  . ENDARTERECTOMY  04/08/2011   Procedure: ENDARTERECTOMY CAROTID;  Surgeon: Elam Dutch, MD;  Location: [facility] OR;  Service: Vascular;   Laterality: Left;  with patch angioplasty  . ENDARTERECTOMY  09/09/2011   Procedure: ENDARTERECTOMY CAROTID;  Surgeon: Elam Dutch, MD;  Location: [facility] OR;  Service: Vascular;  Laterality: Right;  with patch angioplasty  . ESOPHAGOGASTRODUODENOSCOPY (EGD) WITH PROPOFOL N/A 03/10/2018   Procedure: ESOPHAGOGASTRODUODENOSCOPY (EGD) WITH PROPOFOL;  Surgeon: Arta Silence, MD;  Location: [place];  Service: Endoscopy;  Laterality: N/A;  . FEMORAL-POPLITEAL BYPASS GRAFT Left 10/25/2017   Procedure: Left Common Femoral Endarterectomy and perfundaplasty, Left BYPASS GRAFT FEMORAL-BELOW KNEE POPLITEAL ARTERY.;  Surgeon: Elam Dutch, MD;  Location: [place];  Service: Vascular;  Laterality: Left;  . GROIN DEBRIDEMENT Left 11/10/2017   Procedure: GROIN DEBRIDEMENT;  Surgeon: Elam Dutch, MD;  Location: [place];  Service: Vascular;  Laterality: Left;  . LEFT HEART CATHETERIZATION WITH CORONARY ANGIOGRAM N/A 07/09/2011   Procedure: LEFT HEART CATHETERIZATION WITH CORONARY  Cyril Loosen;  Surgeon: Sinclair Grooms, MD;  Location: [facility] C[facility] LAB;  Service: Cardiovascular;  Laterality: N/A;  . LOWER EXTREMITY ANGIOGRAPHY N/A 10/11/2017   Procedure: LOWER EXTREMITY ANGIOGRAPHY;  Surgeon: Waynetta Sandy, MD;  Location: [place] CV LAB;  Service: Cardiovascular;  Laterality: N/A;  . Reimplantation of inferior mesenteric artery    . Repair of infrarenal abdominal aortic aneurysm    . SHOULDER ARTHROSCOPY W/ ROTATOR CUFF REPAIR     right and left-open procedures  . TONSILLECTOMY     at age 84  . TRIGGER FINGER RELEASE Right 06/28/2014   Procedure: RIGHT LONG TRIGGER RELEASE ;  Surgeon: Leanora Cover, MD;  Location: [place];  Service: Orthopedics;  Laterality: Right;  . wisdom teeth extracted     as a teenager  . WOUND DEBRIDEMENT Left 11/05/2017   Procedure: DEBRIDEMENT WOUND LEFT GROIN with wound vac placement;  Surgeon: Elam Dutch, MD;  Location[facility] OR;  Service: Vascular;   Laterality: Left;        Home Medications    Prior to Admission medications   Medication Sig Start Date End Date Taking? Authorizing Provider  acetaminophen (TYLENOL) 500 MG tablet Take 1,000 mg by mouth every 6 (six) hours as needed for headache (pain).    [provider]  amiodarone (PACERONE) 200 MG tablet Take 1 tablet (200 mg total) by mouth 2 (two) times daily. 03/17/18   Isaiah Serge, NP  apixaban (ELIQUIS) 5 MG TABS tablet Take 1 tablet (5 mg total) by mouth 2 (two) times daily. 03/03/18 04/02/18  Arrien, Jimmy Picket, MD  Ascorbic Acid (VITAMIN C PO) Take 1 tablet by mouth 2 (two) times daily.    [provider]  atorvastatin (LIPITOR) 20 MG tablet Take 20 mg by mouth at bedtime.     [provider]  carvedilol (COREG) 25 MG tablet Take 1 tablet (25 mg total) by mouth 2 (two) times daily. 03/03/18 04/02/18  Arrien, Jimmy Picket, MD  fenofibrate 54 MG tablet Take 1 tablet (54 mg total) by mouth daily. 03/03/18 04/02/18  Arrien, Jimmy Picket, MD  gabapentin (NEURONTIN) 300 MG capsule Take 300 mg by mouth 2 (two) times daily.    [provider]  levothyroxine (SYNTHROID, LEVOTHROID) 100 MCG tablet Take 100 mcg by mouth daily before breakfast.  01/07/18   [provider]  methylcellulose (ARTIFICIAL TEARS) 1 % ophthalmic solution Place 1 drop into both eyes 2 (two) times daily as needed (dry eyes).    [provider]  metolazone (ZAROXOLYN) 5 MG tablet Take 5 mg by mouth See admin instructions. Take one tablet (5 mg) by mouth when directed to do so by cardiologist.    [provider]  Multiple Vitamin (MULTIVITAMIN WITH MINERALS) TABS tablet Take 1 tablet by mouth daily. 12/03/17   Rhyne, Hulen Shouts, PA-C  nitroGLYCERIN (NITROSTAT) 0.4 MG SL tablet PLACE 1 TABLET UNDER TONGUE EVERY 5 MINUTES AS NEEDED FOR CHEST PAIN. 09/17/15   Belva Crome, MD  oxymetazoline (VICKS SINEX) 0.05 % nasal spray Place 1 spray into both  nostrils 2 (two) times daily as needed for congestion.    [provider]  pantoprazole (PROTONIX) 40 MG tablet Take 1 tablet (40 mg total) by mouth daily. 03/11/18 03/11/19  Pokhrel, Corrie Mckusick, MD  pioglitazone (ACTOS) 30 MG tablet Take 1 tablet (30 mg total) by mouth daily with lunch. 12/03/17   Rhyne, Hulen Shouts, PA-C  potassium chloride SA (K-DUR,KLOR-CON) 20 MEQ tablet Take  by mouth as directed. 03/18/18   Isaiah Serge, NP  pseudoephedrine-guaifenesin (MUCINEX D) 60-600 MG per tablet Take 1 tablet by mouth every 12 (twelve) hours as needed for congestion.     [provider]  sodium chloride (OCEAN) 0.65 % SOLN nasal spray Place 1 spray into both nostrils 2 (two) times daily as needed for congestion.    [provider]  tamsulosin (FLOMAX) 0.4 MG CAPS capsule Take 0.4 mg by mouth every other day.    [provider]  torsemide (DEMADEX) 20 MG tablet Take 3 tablets (60 mg total) by mouth 2 (two) times daily. 02/03/18   Belva Crome, MD  vitamin B-12 (CYANOCOBALAMIN) 500 MCG tablet Take 500 mcg by mouth daily.    [provider]  zinc gluconate 50 MG tablet Take 50 mg by mouth daily.    [provider]    Family History Family History  Problem Relation Age of Onset  . Heart disease Mother   . Heart disease Father   . Anesthesia problems Neg Hx   . Hypotension Neg Hx   . Malignant hyperthermia Neg Hx   . Pseudochol deficiency Neg Hx     Social History Social History   Tobacco Use  . Smoking status: Former Smoker    Years: 40.00    Types: Cigarettes    Last attempt to quit: 10/27/2005    Years since quitting: 12.3  . Smokeless tobacco: Never Used  Substance Use Topics  . Alcohol use: Yes    Alcohol/week: 2.0 - 3.0 standard drinks    Types: 2 - 3 Cans of beer per week    Comment: occasional- weekly  . Drug use: No     Allergies   Amoxicillin-pot clavulanate; Morphine and related; Percocet [oxycodone-acetaminophen]; and  Restoril [temazepam]   Review of Systems Review of Systems  All other systems reviewed and are negative.    Physical Exam Updated Vital Signs BP (!) 151/60 (BP Location: Right Arm)   Pulse (!) 122   Temp 98.3 F (36.8 C) (Oral)   SpO2 93%   Physical Exam  Constitutional: He appears well-developed and well-nourished. No distress.  HENT:  Head: Normocephalic and atraumatic.  Mouth/Throat: Oropharynx is clear and moist. No oropharyngeal exudate.  Eyes: Pupils are equal, round, and reactive to light. Conjunctivae and EOM are normal. Right eye exhibits no discharge. Left eye exhibits no discharge. No scleral icterus.  Neck: Normal range of motion. Neck supple. No JVD present. No thyromegaly present.  Cardiovascular: Normal rate, regular rhythm, normal heart sounds and intact distal pulses. Exam reveals no gallop and no friction rub.  No murmur heard. Pulmonary/Chest: Effort normal and breath sounds normal. No respiratory distress. He has no wheezes. He has no rales.  Abdominal: Soft. Bowel sounds are normal. He exhibits no distension and no mass. There is no tenderness.  Musculoskeletal: Normal range of motion. He exhibits no edema ( severe pitting edema bilateralliy) or tenderness.  Lymphadenopathy:    He has no cervical adenopathy.  Neurological: He is alert. Coordination normal.  Skin: Skin is warm and dry. No rash noted. No erythema.  Psychiatric: He has a normal mood and affect. His behavior is normal.  Nursing note and vitals reviewed.   ED Treatments / Results  Labs (all labs ordered are listed, but only abnormal results are displayed) Labs Reviewed  CBC WITH DIFFERENTIAL/PLATELET  COMPREHENSIVE METABOLIC PANEL  URINALYSIS, ROUTINE W REFLEX MICROSCOPIC  TSH  BRAIN NATRIURETIC PEPTIDE  TROPONIN I  MAGNESIUM    EKG EKG Interpretation  Date/Time:  Friday March 18 2018 13:54:02 EDT Ventricular Rate:  122 PR Interval:    QRS Duration: 200 QT Interval:  378 QTC  Calculation: 538 R Axis:   -112 Text Interpretation:  Atrial flutter with 2:1 A-V conduction Right bundle branch block Abnormal ECG Since last tracing rate faster Confirmed by Noemi Chapel [phone]) on 03/18/2018 3:09:58 PM    Radiology No results found.  Procedures Procedures (including critical care time)  Medications Ordered in ED Medications  magnesium sulfate IVPB 2 g 50 mL (has no administration in time range)  potassium chloride 10 mEq in 100 mL IVPB (has no administration in time range)  furosemide (LASIX) injection 40 mg (40 mg Intravenous Given 03/18/18 1410)     Initial Impression / Assessment and Plan / ED Course  I have reviewed the triage vital signs and the nursing notes.  Pertinent labs & imaging results that were available during my care of the patient were reviewed by me and considered in my medical decision making (see chart for details).  Clinical Course as of Mar 18 1506  Fri Mar 18, 2018  1506 Potassium continues to drop, very weak, will discuss with admitting team   [BM]    Clinical Course User Index [BM] Noemi Chapel, MD   Though the patient is generally weak diffusely there is no focal weakness, his speech is clear, memory is intact and he is able to follow my commands.  He is not tachycardic though he is fluid overloaded and this appears to be chronic based on the notes however with his progressive weakness will need to evaluate for anemia, electro light deficiencies, progressive renal dysfunction etc.  Patient agreeable, likely needs admitted.  Due to the constellation of findings including hyponatremia, hypokalemia, acute kidney injury which seems to be getting worse with an elevated BUN and his slightly worsening anemia in the presence of increasing peripheral edema and weakness the patient will likely need to be admitted to the hospital.  Have ordered magnesium, potassium, will discuss with hospitalist for admission.  D/w Dr. Karleen Hampshire - will  admit  Final Clinical Impressions(s) / ED Diagnoses   Final diagnoses:  Hypokalemia  AKI (acute kidney injury) ([place])  Acute on chronic congestive heart failure, unspecified heart failure type [facility])      Noemi Chapel, MD 03/18/18 [phone]

## 2018-03-19 ENCOUNTER — Other Ambulatory Visit: Payer: Self-pay

## 2018-03-19 DIAGNOSIS — I483 Typical atrial flutter: Secondary | ICD-10-CM

## 2018-03-19 DIAGNOSIS — I5040 Unspecified combined systolic (congestive) and diastolic (congestive) heart failure: Secondary | ICD-10-CM

## 2018-03-19 DIAGNOSIS — I5043 Acute on chronic combined systolic (congestive) and diastolic (congestive) heart failure: Secondary | ICD-10-CM

## 2018-03-19 LAB — BASIC METABOLIC PANEL
ANION GAP: 14 (ref 5–15)
BUN: 73 mg/dL — ABNORMAL HIGH (ref 8–23)
CALCIUM: 8.4 mg/dL — AB (ref 8.9–10.3)
CO2: 28 mmol/L (ref 22–32)
CREATININE: 2.31 mg/dL — AB (ref 0.61–1.24)
Chloride: 89 mmol/L — ABNORMAL LOW (ref 98–111)
GFR calc Af Amer: 29 mL/min — ABNORMAL LOW (ref >60)
GFR, EST NON AFRICAN AMERICAN: 25 mL/min — AB (ref >60)
GLUCOSE: 213 mg/dL — AB (ref 70–99)
Potassium: 2.6 mmol/L — CL (ref 3.5–5.1)
Sodium: 131 mmol/L — ABNORMAL LOW (ref 135–145)

## 2018-03-19 LAB — CBC
HCT: 27.9 % — ABNORMAL LOW (ref 39.0–52.0)
Hemoglobin: 8.9 g/dL — ABNORMAL LOW (ref 13.0–17.0)
MCH: 27.8 pg (ref 26.0–34.0)
MCHC: 31.9 g/dL (ref 30.0–36.0)
MCV: 87.2 fL (ref 80.0–100.0)
PLATELETS: 236 10*3/uL (ref 150–400)
RBC: 3.2 MIL/uL — AB (ref 4.22–5.81)
RDW: 16.2 % — AB (ref 11.5–15.5)
WBC: 9.2 10*3/uL (ref 4.0–10.5)
nRBC: 0 % (ref 0.0–0.2)

## 2018-03-19 LAB — GLUCOSE, CAPILLARY
GLUCOSE-CAPILLARY: 206 mg/dL — AB (ref 70–99)
GLUCOSE-CAPILLARY: 229 mg/dL — AB (ref 70–99)
GLUCOSE-CAPILLARY: 141 mg/dL — AB (ref 70–99)
Glucose-Capillary: 206 mg/dL — ABNORMAL HIGH (ref 70–99)
Glucose-Capillary: 218 mg/dL — ABNORMAL HIGH (ref 70–99)

## 2018-03-19 LAB — T4, FREE: Free T4: 1.72 ng/dL (ref 0.82–1.77)

## 2018-03-19 MED ORDER — ACETAMINOPHEN 325 MG PO TABS
650.0000 mg | ORAL_TABLET | Freq: Four times a day (QID) | ORAL | Status: DC | PRN
  Administered 2018-03-20 – 2018-04-02 (×18): 650 mg via ORAL
  Filled 2018-03-19 (×20): qty 2

## 2018-03-19 MED ORDER — POTASSIUM CHLORIDE CRYS ER 20 MEQ PO TBCR
40.0000 meq | EXTENDED_RELEASE_TABLET | Freq: Three times a day (TID) | ORAL | Status: AC
  Administered 2018-03-19 – 2018-03-20 (×6): 40 meq via ORAL
  Filled 2018-03-19 (×5): qty 2

## 2018-03-19 NOTE — Consult Note (Signed)
[facility] Nurse wound consult note Reason for Consult: Three ruptured serum filled blisters on left LE and healing full thickness wound on left great toe (lateral aspect) Wound type: venous insufficiency Pressure Injury POA: N/A Measurement: Three blisters on left LE:   Anterior LE measures 6cm x 3cm x 0.1cm Medial malleolus measures 4cm x 2.5cm x 0.1cm Lateral LE measures 3cm x 3cm x 0.1cm Left great toe (lateral aspect) healing full thickness wound measuring 2cm x 2.2cm x 0.1cm  Wound bed:All have light pink wound bed and small amounts of serous exudate. Drainage (amount, consistency, odor) As described above Periwound: intact with (bilateral) edema and ruddy red hemosiderin staining Dressing procedure/placement/frequency: I will provide Nursing with guidance via the orders for daily dressing changes using xeroform gauze to dry and prevent infection with its astringent and antimicrobial properties. Left LE to be placed in a Prevalon pressure redistribution heel boot for mild elevation and protection from injury.  [place] nursing team will not follow, but will remain available to this patient, the nursing and medical teams.  Please re-consult if needed. Thanks, Maudie Flakes, MSN, RN, [place], Arther Abbott  Pager# [phone]

## 2018-03-19 NOTE — Progress Notes (Signed)
Pt  K level at 2.6 called in by Lab. Dr. Thereasa Solo text paged  And updated.

## 2018-03-19 NOTE — Progress Notes (Signed)
Progress Note  Patient Name: [name] Date of Encounter: 03/19/2018  Primary Cardiologist: Sinclair Grooms, MD    Subjective   [name] is a 77 year old gentleman with a history of chronic combined systolic and diastolic congestive heart failure, newly diagnosed atrial fibrillation, recent GI bleeding, severe peripheral arterial disease, mild dementia, coronary artery disease-status post CABG hypertension, type 2 diabetes mellitus and chronic kidney disease disease stage III-IV.  He had lower extremity cellulitis with a superinfection with weeping lower extremities.  Was recently admitted to the hospital with generalized profound weakness, CHF exacerbation and A. fib with rapid ventricular response  He has been taking his medications but still eats a lot of salt.  He eats [place] food on a regular basis.  Inpatient Medications    Scheduled Meds: . amiodarone  200 mg Oral BID  . apixaban  5 mg Oral BID  . atorvastatin  20 mg Oral QPC supper  . carvedilol  25 mg Oral BID  . fenofibrate  54 mg Oral Daily  . gabapentin  300 mg Oral BID PC  . insulin aspart  0-20 Units Subcutaneous TID WC  . insulin aspart  0-5 Units Subcutaneous QHS  . levothyroxine  100 mcg Oral QAC breakfast  . pantoprazole  40 mg Oral Daily  . potassium chloride  40 mEq Oral TID  . sodium chloride flush  3 mL Intravenous Q12H  . tamsulosin  0.4 mg Oral Q M,W,F   Continuous Infusions: . sodium chloride    . furosemide 120 mg (03/18/18 2308)   PRN Meds: sodium chloride, acetaminophen, nitroGLYCERIN, ondansetron (ZOFRAN) IV, sodium chloride flush   Vital Signs    Vitals:   03/18/18 2151 03/19/18 0100 03/19/18 0446 03/19/18 0758  BP: (!) 144/97 127/71 (!) 149/80 (!) 131/59  Pulse: (!) 127 (!) 124 (!) 122 (!) 124  Resp: (!) 34 (!) 22 20   Temp: 99.1 F (37.3 C) 99.3 F (37.4 C) 99.1 F (37.3 C)   TempSrc: Oral Oral Oral   SpO2: 92% 100% 98% 100%  Weight:   115.9 kg     Intake/Output  Summary (Last 24 hours) at 03/19/2018 0845 Last data filed at 03/19/2018 0454 Gross per 24 hour  Intake 729.33 ml  Output 975 ml  Net -245.67 ml   Filed Weights   03/19/18 0446  Weight: 115.9 kg    Telemetry    Atrial flutter with rapid ventricular response- Personally Reviewed  ECG    Atrial flutter with rapid ventricular response- Personally Reviewed  Physical Exam   GEN:  Elderly gentleman.  No acute distress Neck: No JVD Cardiac:  Rate, tachycardic, soft systolic murmur Respiratory: Clear to auscultation bilaterally. GI: Soft, nontender, non-distended  MS:  1-2+ pitting edema.  Several ulcerations on his left leg. Neuro:  Nonfocal  Psych: Normal affect   Labs    Chemistry Recent Labs  Lab 03/17/18 1627 03/18/18 1325 03/18/18 2158 03/19/18 0540  NA 132* 130*  --  131*  K 3.2* 2.4* 2.8* 2.6*  CL 89* 92*  --  89*  CO2 26 24  --  28  GLUCOSE 267* 224*  --  213*  BUN 67* 75*  --  73*  CREATININE 2.40* 2.51*  --  2.31*  CALCIUM 8.7 8.2*  --  8.4*  PROT 6.3 5.6*  --   --   ALBUMIN 3.4* 2.5*  --   --   AST 20 26  --   --   ALT  11 13  --   --   ALKPHOS 40 32*  --   --   BILITOT 0.7 1.3*  --   --   GFRNONAA 25* 23*  --  25*  GFRAA 29* 27*  --  29*  ANIONGAP  --  14  --  14     Hematology Recent Labs  Lab 03/18/18 1325 03/19/18 0540  WBC 8.6 9.2  RBC 3.12* 3.20*  HGB 8.8* 8.9*  HCT 27.7* 27.9*  MCV 88.8 87.2  MCH 28.2 27.8  MCHC 31.8 31.9  RDW 16.4* 16.2*  PLT 235 236    Cardiac Enzymes Recent Labs  Lab 03/18/18 1325  TROPONINI 0.03*   No results for input(s): TROPIPOC in the last 168 hours.   BNP Recent Labs  Lab 03/18/18 1325  BNP 2,440.0*     DDimer No results for input(s): DDIMER in the last 168 hours.   Radiology    Dg Chest 2 View  Result Date: 03/18/2018 CLINICAL DATA:  77 y.o. male with a history of generalized weakness for 2 days EXAM: CHEST - 2 VIEW COMPARISON:  02/28/2018 FINDINGS: Cardiomediastinal silhouette  unchanged with cardiomegaly and atherosclerosis of the aorta. Surgical changes of median sternotomy. Interlobular septal thickening with fullness of the vasculature. Blunting at the costophrenic angle and cardiophrenic angle. Minimal meniscus on the lateral view. No displaced fracture. IMPRESSION: Early CHF with small right pleural effusion. Surgical changes of median sternotomy Electronically Signed   By: Corrie Mckusick D.O.   On: 03/18/2018 13:40    Cardiac Studies     Patient Profile     77 y.o. male with a history of acute on chronic combined heart failure, new atrial flutter, recent GI bleeding, severe peripheral arterial disease, coronary artery disease is admitted with worsening weakness and leg swelling.  Assessment & Plan    1.  Acute on chronic combined systolic and diastolic congestive heart failure: The patient is responding well to IV Lasix.  He is diuresed recorded 245 cc.  His wife states that his legs are significantly better.  It is possible that he did not save all of his urine. Wt Readings from Last 3 Encounters:  03/19/18 115.9 kg  03/17/18 118 kg  03/15/18 118.8 kg    His weight is down 3 kg from October 22.  We will continue with current diuretics.  2.  Atrial flutter: The patient has been started on amiodarone.  GI has given the okay to resume Eliquis.  He has a history of recent GI bleed.  Hemoglobin remained stable at 8.9. We may need to consider transesophageal echocardiogram with cardioversion early next week.  3.  Coronary artery disease: The patient's not having any angina.  Continue medical therapy for now.       For questions or updates, please contact [place] Please consult [email or web address] for contact info under        Signed, Mertie Moores, MD  03/19/2018, 8:45 AM

## 2018-03-19 NOTE — Progress Notes (Signed)
WOC consulted for Wound blister  And L great toe wound  Blister per protocol. Blister is from previous admission per family.

## 2018-03-19 NOTE — Progress Notes (Signed)
[facility] TEAM 1 - Stepdown/ICU TEAM  TAYTE MCWHERTER  CBJ:[id number] DOB: 03/25/1941 DOA: 03/18/2018 PCP: Lavone Orn, MD    Brief Narrative:  77 y.o. male with a hx of systolic heart failure, atrial flutter, recent GI bleed, CAD s/p CABG, hypothyroidism, DM, GERD, HTN, HLD, stage III/stage IV CKD, and PAD who was recently discharged from the hospital after a GI bleed who presented w/ generalized weakness, and inability to ambulate, and shortness of breath.    In the ED he was found to be tachycardic with a heart rate of 122/min, and hypoxic requiring 4 L of nasal cannula oxygen.  Significant Events: 10/25 admit   Subjective: Pt c/o feeling cold all the time. He reports ongoing sob, and severe swelling in both legs. He denies cp, n/v, or abdom pain.   Assessment & Plan:  Acute exacerbation of chronic systolic and diastolic CHF EF 16-07% w/ grade 2 DD per TTE 02/09/18 - continue diuresis - grossly overloaded on exam - follow weights and strict Is/Os Filed Weights   03/19/18 0446  Weight: 115.9 kg    Atrial flutter - Atrial Fib Rate not yet controlled - Cardiology is directing care   Hypokalemia Due to high dose diuretic - supplement - follow Mg   Hyponatremia Due to CHF - follow w/ diuresis   DM2 CBG poorly controlled - adjust tx and follow   HLD  Anemia of chronic kidney disease and recent GIB Follow trend - Hgb stable for now   CAD s/p CABG Has suspected graft failure - not a candidate for cardiac cath given kidney disease   CKD Stage 3 crt improving w/ diuresis at this time   DVT prophylaxis: eliquis  Code Status: FULL CODE Family Communication: no family present at time of exam  Disposition Plan:   Consultants:  [facility]  Antimicrobials:  none   Objective: Blood pressure (!) 131/59, pulse (!) 124, temperature 99.1 F (37.3 C), temperature source Oral, resp. rate 20, weight 115.9 kg, SpO2 100 %.  Intake/Output Summary (Last 24 hours) at  03/19/2018 1457 Last data filed at 03/19/2018 0959 Gross per 24 hour  Intake 1089.33 ml  Output 975 ml  Net 114.33 ml   Filed Weights   03/19/18 0446  Weight: 115.9 kg    Examination: General: No acute respiratory distress Lungs: Clear to auscultation bilaterally without wheezes or crackles Cardiovascular: Regular rate and rhythm without murmur gallop or rub normal S1 and S2 Abdomen: Nontender, nondistended, soft, bowel sounds positive, no rebound, no ascites, no appreciable mass Extremities: No significant cyanosis, clubbing, or edema bilateral lower extremities  CBC: Recent Labs  Lab 03/18/18 1325 03/19/18 0540  WBC 8.6 9.2  NEUTROABS 7.5  --   HGB 8.8* 8.9*  HCT 27.7* 27.9*  MCV 88.8 87.2  PLT 235 371   Basic Metabolic Panel: Recent Labs  Lab 03/17/18 1627 03/18/18 1325 03/18/18 2158 03/19/18 0540  NA 132* 130*  --  131*  K 3.2* 2.4* 2.8* 2.6*  CL 89* 92*  --  89*  CO2 26 24  --  28  GLUCOSE 267* 224*  --  213*  BUN 67* 75*  --  73*  CREATININE 2.40* 2.51*  --  2.31*  CALCIUM 8.7 8.2*  --  8.4*  MG 2.0 1.9  --   --    GFR: Estimated Creatinine Clearance: 34.1 mL/min (A) (by C-G formula based on SCr of 2.31 mg/dL (H)).  Liver Function Tests: Recent Labs  Lab 03/17/18 1627  03/18/18 1325  AST 20 26  ALT 11 13  ALKPHOS 40 32*  BILITOT 0.7 1.3*  PROT 6.3 5.6*  ALBUMIN 3.4* 2.5*    Cardiac Enzymes: Recent Labs  Lab 03/18/18 1325  TROPONINI 0.03*    HbA1C: Hgb A1c MFr Bld  Date/Time Value Ref Range Status  10/21/2017 12:33 PM 6.2 (H) 4.8 - 5.6 % Final    Comment:    (NOTE) Pre diabetes:          5.7%-6.4% Diabetes:              >6.4% Glycemic control for   <7.0% adults with diabetes   09/24/2011 05:50 AM 6.4 (H) <5.7 % Final    Comment:    (NOTE)                                                                       According to the ADA Clinical Practice Recommendations for 2011, when HbA1c is used as a screening test:  >=6.5%    Diagnostic of Diabetes Mellitus           (if abnormal result is confirmed) 5.7-6.4%   Increased risk of developing Diabetes Mellitus References:Diagnosis and Classification of Diabetes Mellitus,Diabetes ZOXW,9604,54(UJWJX 1):S62-S69 and Standards of Medical Care in         Diabetes - 2011,Diabetes Care,2011,34 (Suppl 1):S11-S61.    CBG: Recent Labs  Lab 03/18/18 2258 03/19/18 0136 03/19/18 0747 03/19/18 1153  GLUCAP 235* 206* 218* 206*    Scheduled Meds: . amiodarone  200 mg Oral BID  . apixaban  5 mg Oral BID  . atorvastatin  20 mg Oral QPC supper  . carvedilol  25 mg Oral BID  . fenofibrate  54 mg Oral Daily  . gabapentin  300 mg Oral BID PC  . insulin aspart  0-20 Units Subcutaneous TID WC  . insulin aspart  0-5 Units Subcutaneous QHS  . levothyroxine  100 mcg Oral QAC breakfast  . pantoprazole  40 mg Oral Daily  . potassium chloride  40 mEq Oral TID  . sodium chloride flush  3 mL Intravenous Q12H  . tamsulosin  0.4 mg Oral Q M,W,F   Continuous Infusions: . sodium chloride    . furosemide 120 mg (03/19/18 0841)     LOS: 1 day   Cherene Altes, MD [facility] Office  [phone] Pager - Text Page per Amion  If 7PM-7AM, please contact night-coverage per Amion 03/19/2018, 2:57 PM

## 2018-03-20 DIAGNOSIS — I5041 Acute combined systolic (congestive) and diastolic (congestive) heart failure: Secondary | ICD-10-CM

## 2018-03-20 LAB — BASIC METABOLIC PANEL
Anion gap: 15 (ref 5–15)
BUN: 88 mg/dL — AB (ref 8–23)
CHLORIDE: 88 mmol/L — AB (ref 98–111)
CO2: 29 mmol/L (ref 22–32)
CREATININE: 2.72 mg/dL — AB (ref 0.61–1.24)
Calcium: 8.6 mg/dL — ABNORMAL LOW (ref 8.9–10.3)
GFR calc Af Amer: 24 mL/min — ABNORMAL LOW (ref >60)
GFR calc non Af Amer: 21 mL/min — ABNORMAL LOW (ref >60)
Glucose, Bld: 173 mg/dL — ABNORMAL HIGH (ref 70–99)
Potassium: 3.2 mmol/L — ABNORMAL LOW (ref 3.5–5.1)
SODIUM: 132 mmol/L — AB (ref 135–145)

## 2018-03-20 LAB — BLOOD GAS, ARTERIAL
ACID-BASE EXCESS: 4.4 mmol/L — AB (ref 0.0–2.0)
Bicarbonate: 27.5 mmol/L (ref 20.0–28.0)
DRAWN BY: 213381
O2 Content: 2 L/min
O2 Saturation: 97.5 %
PCO2 ART: 38.6 mmHg (ref 32.0–48.0)
PH ART: 7.477 — AB (ref 7.350–7.450)
Patient temperature: 102.7
pO2, Arterial: 97 mmHg (ref 83.0–108.0)

## 2018-03-20 LAB — CBC
HCT: 30.4 % — ABNORMAL LOW (ref 39.0–52.0)
Hemoglobin: 9.4 g/dL — ABNORMAL LOW (ref 13.0–17.0)
MCH: 27.4 pg (ref 26.0–34.0)
MCHC: 30.9 g/dL (ref 30.0–36.0)
MCV: 88.6 fL (ref 80.0–100.0)
Platelets: 253 10*3/uL (ref 150–400)
RBC: 3.43 MIL/uL — AB (ref 4.22–5.81)
RDW: 16.2 % — ABNORMAL HIGH (ref 11.5–15.5)
WBC: 9.4 10*3/uL (ref 4.0–10.5)
nRBC: 0 % (ref 0.0–0.2)

## 2018-03-20 LAB — HEPATIC FUNCTION PANEL
ALK PHOS: 40 U/L (ref 38–126)
ALT: 22 U/L (ref 0–44)
AST: 49 U/L — ABNORMAL HIGH (ref 15–41)
Albumin: 2.2 g/dL — ABNORMAL LOW (ref 3.5–5.0)
BILIRUBIN INDIRECT: 0.6 mg/dL (ref 0.3–0.9)
Bilirubin, Direct: 0.6 mg/dL — ABNORMAL HIGH (ref 0.0–0.2)
TOTAL PROTEIN: 5.7 g/dL — AB (ref 6.5–8.1)
Total Bilirubin: 1.2 mg/dL (ref 0.3–1.2)

## 2018-03-20 LAB — URINALYSIS, ROUTINE W REFLEX MICROSCOPIC
Bilirubin Urine: NEGATIVE
Glucose, UA: NEGATIVE mg/dL
Ketones, ur: NEGATIVE mg/dL
Nitrite: NEGATIVE
Protein, ur: 30 mg/dL — AB
SPECIFIC GRAVITY, URINE: 1.008 (ref 1.005–1.030)
pH: 6 (ref 5.0–8.0)

## 2018-03-20 LAB — GLUCOSE, CAPILLARY
GLUCOSE-CAPILLARY: 181 mg/dL — AB (ref 70–99)
GLUCOSE-CAPILLARY: 206 mg/dL — AB (ref 70–99)
GLUCOSE-CAPILLARY: 187 mg/dL — AB (ref 70–99)
Glucose-Capillary: 235 mg/dL — ABNORMAL HIGH (ref 70–99)
Glucose-Capillary: 201 mg/dL — ABNORMAL HIGH (ref 70–99)

## 2018-03-20 LAB — LIPASE, BLOOD: Lipase: 29 U/L (ref 11–51)

## 2018-03-20 LAB — MAGNESIUM: MAGNESIUM: 2.3 mg/dL (ref 1.7–2.4)

## 2018-03-20 MED ORDER — SODIUM CHLORIDE 0.9 % IV SOLN
1.0000 g | INTRAVENOUS | Status: DC
  Administered 2018-03-21: 1 g via INTRAVENOUS
  Filled 2018-03-20 (×2): qty 1

## 2018-03-20 MED ORDER — ACETAMINOPHEN 650 MG RE SUPP
650.0000 mg | RECTAL | Status: DC | PRN
  Filled 2018-03-20: qty 1

## 2018-03-20 MED ORDER — FUROSEMIDE 10 MG/ML IJ SOLN
120.0000 mg | Freq: Two times a day (BID) | INTRAVENOUS | Status: DC
  Administered 2018-03-20 – 2018-03-22 (×5): 120 mg via INTRAVENOUS
  Filled 2018-03-20: qty 10
  Filled 2018-03-20: qty 12
  Filled 2018-03-20: qty 10
  Filled 2018-03-20 (×2): qty 12
  Filled 2018-03-20: qty 10
  Filled 2018-03-20: qty 12

## 2018-03-20 MED ORDER — TRAMADOL HCL 50 MG PO TABS: 50.0000 mg | ORAL_TABLET | Freq: Four times a day (QID) | ORAL | Status: DC | PRN

## 2018-03-20 MED ORDER — SODIUM CHLORIDE 0.9 % IV SOLN
2.0000 g | Freq: Once | INTRAVENOUS | Status: AC
  Administered 2018-03-20: 2 g via INTRAVENOUS
  Filled 2018-03-20: qty 2

## 2018-03-20 NOTE — Progress Notes (Signed)
Pharmacy Antibiotic Note  [name] is a 77 y.o. male admitted on 03/18/2018 with UTI.  Pharmacy has been consulted for cefepime dosing. WBC wnl. SCr elevated at 2.72 (BL ~ 1.8). CrCl ~ 20-25 mL/min   Plan: -Cefepime 1 gm IV Q 24 hours  -Monitor cultures, renal fx, and clinical progress  Weight: 248 lb 14.4 oz (112.9 kg)  Temp (24hrs), Avg:99.8 F (37.7 C), Min:98.3 F (36.8 C), Max:102.7 F (39.3 C)  Recent Labs  Lab 03/17/18 1627 03/18/18 1325 03/19/18 0540 03/20/18 0537  WBC  --  8.6 9.2 9.4  CREATININE 2.40* 2.51* 2.31* 2.72*    Estimated Creatinine Clearance: 28.6 mL/min (A) (by C-G formula based on SCr of 2.72 mg/dL (H)).    Allergies  Allergen Reactions  . Amoxicillin-Pot Clavulanate Nausea Only    Has patient had a PCN reaction causing immediate rash, facial/tongue/throat swelling, SOB or lightheadedness with hypotension: No Has patient had a PCN reaction causing severe rash involving mucus membranes or skin necrosis: No Has patient had a PCN reaction that required hospitalization: No Has patient had a PCN reaction occurring within the last 10 years: No If all of the above answers are "NO", then may proceed with Cephalosporin use.   Marland Kitchen Morphine And Related Other (See Comments)    hallucinations  . Percocet [Oxycodone-Acetaminophen] Other (See Comments)    Agitation and Increased Confusion. Family asks to please not order this medication for him.   . Restoril [Temazepam] Other (See Comments)    Confusion and Agitation. Has taken in past, but has not reacted well. Family requests to not give this mediation.      Thank you for allowing pharmacy to be a part of this patient's care.  Albertina Parr, PharmD., BCPS Clinical Pharmacist Clinical phone for 03/20/18 until 10pm: [phone]

## 2018-03-20 NOTE — Progress Notes (Signed)
[facility] TEAM 1 - Stepdown/ICU TEAM  GLADYS GUTMAN  UVO:[id number] DOB: [birth year] DOA: 03/18/2018 PCP: Lavone Orn, MD    Brief Narrative:  77 y.o. male with a hx of systolic heart failure, atrial flutter, recent GI bleed, CAD s/p CABG, hypothyroidism, DM, GERD, HTN, HLD, stage III/stage IV CKD, and PAD who was recently discharged from the hospital after a GI bleed who presented w/ generalized weakness, and inability to ambulate, and shortness of breath.    In the ED he was found to be tachycardic with a heart rate of 122/min, and hypoxic requiring 4 L of nasal cannula oxygen.  Significant Events: 10/25 admit   Subjective: Pt is very sleepy today, and only stirs minimally during my exam. He is in no resp distress, nor is there evidence of uncontrolled pain.   I had a very lengthy discussion with the patient's wife and daughter (an RN who was on the phone) at the bedside.   Assessment & Plan:  Acute exacerbation of chronic systolic and diastolic CHF EF 74-25% w/ grade 2 DD per TTE 02/09/18 - continue to diuresis as able - remains grossly overloaded on exam - follow weights and strict Is/Os - renal function may prove to be a limiting step as crt is rising  [employer]   03/19/18 0446 03/20/18 0500  Weight: 115.9 kg 112.9 kg    Atrial flutter - Atrial Fib Rate not yet controlled - Cardiology is directing care - considering DCCV asap - Eliquis was held during recent admit for GIB, but resumed at time of d/c   Low protein state - low oncotic pressure Albumin 2.2  CKD Stage 3 crt is waxing and waning - baseline crt even earlier this month 1.8-2.0, though has recently been as high as 2.6 in setting of aggressive diuresis - follow trend daily - may interfere w/ attempts to continue diuresis   Recent Labs  Lab 03/17/18 1627 03/18/18 1325 03/19/18 0540 03/20/18 0537  CREATININE 2.40* 2.51* 2.31* 2.72*     Hypokalemia Due to high dose diuretic - cont to supplement to goal  of 4.0 - Mg is ok    Hyponatremia Due to CHF - follow w/ diuresis   DM2 CBG control improved - follow w/o chang today   HLD  Anemia of chronic kidney disease and recent GIB Follow trend - Hgb stable    GIB Oct 2019 EGD 03/10/18 w/o clear source of bleeding   CAD s/p CABG Has suspected graft failure - not a candidate for cardiac cath given kidney disease   Cystic lesion of pancreas Noted on MRI abdom earlier this month - mildly elevated CA 19-9 not felt to be worrisome per GI   General Decline Pt has suffered a significant generalized decline as witnessed by family over the last few weeks - this is his 3rd admission just this month - family acknowledges that the pt appears to be loosing ground with increasing speed - we spoke at length and I have informed them that he may be approaching a point at which his CHF can not be managed effectively    DVT prophylaxis: eliquis  Code Status: FULL CODE Family Communication: as noted above  Disposition Plan: SDU  Consultants:  [facility]  Antimicrobials:  none   Objective: Blood pressure 132/77, pulse (!) 118, temperature 98.3 F (36.8 C), temperature source Oral, resp. rate 18, weight 112.9 kg, SpO2 97 %.  Intake/Output Summary (Last 24 hours) at 03/20/2018 1140 Last data filed at 03/20/2018  0900 Gross per 24 hour  Intake 363 ml  Output -  Net 363 ml   Filed Weights   03/19/18 0446 03/20/18 0500  Weight: 115.9 kg 112.9 kg    Examination: General: No acute respiratory distress at time of visit  Lungs: poor air movement B bases - no wheezing  Cardiovascular: irreg irreg - rate 120 - no M or rub Abdomen: Nontender, obese, soft, bowel sounds positive, no rebound Extremities: 3+ B LE edema w/ weeping   CBC: Recent Labs  Lab 03/18/18 1325 03/19/18 0540 03/20/18 0537  WBC 8.6 9.2 9.4  NEUTROABS 7.5  --   --   HGB 8.8* 8.9* 9.4*  HCT 27.7* 27.9* 30.4*  MCV 88.8 87.2 88.6  PLT 235 236 416   Basic Metabolic  Panel: Recent Labs  Lab 03/17/18 1627 03/18/18 1325 03/18/18 2158 03/19/18 0540 03/20/18 0537  NA 132* 130*  --  131* 132*  K 3.2* 2.4* 2.8* 2.6* 3.2*  CL 89* 92*  --  89* 88*  CO2 26 24  --  28 29  GLUCOSE 267* 224*  --  213* 173*  BUN 67* 75*  --  73* 88*  CREATININE 2.40* 2.51*  --  2.31* 2.72*  CALCIUM 8.7 8.2*  --  8.4* 8.6*  MG 2.0 1.9  --   --  2.3   GFR: Estimated Creatinine Clearance: 28.6 mL/min (A) (by C-G formula based on SCr of 2.72 mg/dL (H)).  Liver Function Tests: Recent Labs  Lab 03/17/18 1627 03/18/18 1325 03/20/18 0537  AST 20 26 49*  ALT 11 13 22   ALKPHOS 40 32* 40  BILITOT 0.7 1.3* 1.2  PROT 6.3 5.6* 5.7*  ALBUMIN 3.4* 2.5* 2.2*    Cardiac Enzymes: Recent Labs  Lab 03/18/18 1325  TROPONINI 0.03*    HbA1C: Hgb A1c MFr Bld  Date/Time Value Ref Range Status  10/21/2017 12:33 PM 6.2 (H) 4.8 - 5.6 % Final    Comment:    (NOTE) Pre diabetes:          5.7%-6.4% Diabetes:              >6.4% Glycemic control for   <7.0% adults with diabetes   09/24/2011 05:50 AM 6.4 (H) <5.7 % Final    Comment:    (NOTE)                                                                       According to the ADA Clinical Practice Recommendations for 2011, when HbA1c is used as a screening test:  >=6.5%   Diagnostic of Diabetes Mellitus           (if abnormal result is confirmed) 5.7-6.4%   Increased risk of developing Diabetes Mellitus References:Diagnosis and Classification of Diabetes Mellitus,Diabetes SAYT,0160,10(XNATF 1):S62-S69 and Standards of Medical Care in         Diabetes - 2011,Diabetes Care,2011,34 (Suppl 1):S11-S61.    CBG: Recent Labs  Lab 03/19/18 0747 03/19/18 1153 03/19/18 1644 03/19/18 2114 03/20/18 0745  GLUCAP 218* 206* 229* 141* 181*    Scheduled Meds: . amiodarone  200 mg Oral BID  . apixaban  5 mg Oral BID  . atorvastatin  20 mg Oral QPC supper  . carvedilol  25 mg Oral BID  . fenofibrate  54 mg Oral Daily  .  gabapentin  300 mg Oral BID PC  . insulin aspart  0-20 Units Subcutaneous TID WC  . insulin aspart  0-5 Units Subcutaneous QHS  . levothyroxine  100 mcg Oral QAC breakfast  . pantoprazole  40 mg Oral Daily  . potassium chloride  40 mEq Oral TID  . sodium chloride flush  3 mL Intravenous Q12H  . tamsulosin  0.4 mg Oral Q M,W,F   Continuous Infusions: . sodium chloride    . furosemide 120 mg (03/20/18 7782)     LOS: 2 days   Cherene Altes, MD [facility] Office  [phone] Pager - Text Page per Amion  If 7PM-7AM, please contact night-coverage per Amion 03/20/2018, 11:40 AM

## 2018-03-20 NOTE — Progress Notes (Signed)
Patient talked to the staff in a loud and hateful voice. He raises his voice to nurse and the nurse tech and speaks in a very hateful and angry manner. Staff ignored him and continued to provide appropriate care. Staff gave him a bath changed his bed sheets and wound dressing as ordered.

## 2018-03-20 NOTE — Progress Notes (Signed)
Progress Note  Patient Name: [name] Date of Encounter: 03/20/2018  Primary Cardiologist: Sinclair Grooms, MD    Subjective   [name] is a 77 year old gentleman with a history of chronic combined systolic and diastolic congestive heart failure, newly diagnosed atrial fibrillation, recent GI bleeding, severe peripheral arterial disease, mild dementia, coronary artery disease-status post CABG hypertension, type 2 diabetes mellitus and chronic kidney disease disease stage III-IV.  He had lower extremity cellulitis with a superinfection with weeping lower extremities.  Was recently admitted to the hospital with generalized profound weakness, CHF exacerbation and A. fib with rapid ventricular response  He has been taking his medications but still eats a lot of salt.  He eats [place] food on a regular basis.  Inpatient Medications    Scheduled Meds: . amiodarone  200 mg Oral BID  . apixaban  5 mg Oral BID  . atorvastatin  20 mg Oral QPC supper  . carvedilol  25 mg Oral BID  . fenofibrate  54 mg Oral Daily  . gabapentin  300 mg Oral BID PC  . insulin aspart  0-20 Units Subcutaneous TID WC  . insulin aspart  0-5 Units Subcutaneous QHS  . levothyroxine  100 mcg Oral QAC breakfast  . pantoprazole  40 mg Oral Daily  . potassium chloride  40 mEq Oral TID  . sodium chloride flush  3 mL Intravenous Q12H  . tamsulosin  0.4 mg Oral Q M,W,F   Continuous Infusions: . sodium chloride    . furosemide 120 mg (03/20/18 0812)   PRN Meds: sodium chloride, acetaminophen, nitroGLYCERIN, ondansetron (ZOFRAN) IV, sodium chloride flush   Vital Signs    Vitals:   03/19/18 1647 03/20/18 0028 03/20/18 0443 03/20/18 0500  BP: (!) 135/108 103/78 132/77   Pulse: (!) 123 (!) 117 (!) 118   Resp:  18 18   Temp:  98.3 F (36.8 C) 98.3 F (36.8 C)   TempSrc:  Oral Oral   SpO2: 99% 98% 97%   Weight:    112.9 kg    Intake/Output Summary (Last 24 hours) at 03/20/2018 0948 Last data  filed at 03/20/2018 0817 Gross per 24 hour  Intake 363 ml  Output -  Net 363 ml   Filed Weights   03/19/18 0446 03/20/18 0500  Weight: 115.9 kg 112.9 kg    Telemetry    Atrial flutter with rapid ventricular response personally Reviewed  ECG    Atrial flutter with a rapid ventricular response  - Personally Reviewed  Physical Exam   GEN:  Elderly gentleman.  Mildly demented. Neck: No JVD Cardiac:  Regular rate, tachycardic.  Soft systolic murmur Respiratory: Clear to auscultation bilaterally. GI: Soft, nontender, non-distended  MS:  2+ pitting edema bilaterally.  Several ulcerations on his lower legs. Neuro:  Nonfocal  Psych: Focal to assess.  I get the feeling that he is mildly demented.    Labs    Chemistry Recent Labs  Lab 03/17/18 1627 03/18/18 1325 03/18/18 2158 03/19/18 0540 03/20/18 0537  NA 132* 130*  --  131* 132*  K 3.2* 2.4* 2.8* 2.6* 3.2*  CL 89* 92*  --  89* 88*  CO2 26 24  --  28 29  GLUCOSE 267* 224*  --  213* 173*  BUN 67* 75*  --  73* 88*  CREATININE 2.40* 2.51*  --  2.31* 2.72*  CALCIUM 8.7 8.2*  --  8.4* 8.6*  PROT 6.3 5.6*  --   --  5.7*  ALBUMIN 3.4* 2.5*  --   --  2.2*  AST 20 26  --   --  49*  ALT 11 13  --   --  22  ALKPHOS 40 32*  --   --  40  BILITOT 0.7 1.3*  --   --  1.2  GFRNONAA 25* 23*  --  25* 21*  GFRAA 29* 27*  --  29* 24*  ANIONGAP  --  14  --  14 15     Hematology Recent Labs  Lab 03/18/18 1325 03/19/18 0540 03/20/18 0537  WBC 8.6 9.2 9.4  RBC 3.12* 3.20* 3.43*  HGB 8.8* 8.9* 9.4*  HCT 27.7* 27.9* 30.4*  MCV 88.8 87.2 88.6  MCH 28.2 27.8 27.4  MCHC 31.8 31.9 30.9  RDW 16.4* 16.2* 16.2*  PLT 235 236 253    Cardiac Enzymes Recent Labs  Lab 03/18/18 1325  TROPONINI 0.03*   No results for input(s): TROPIPOC in the last 168 hours.   BNP Recent Labs  Lab 03/18/18 1325  BNP 2,440.0*     DDimer No results for input(s): DDIMER in the last 168 hours.   Radiology    Dg Chest 2 View  Result Date:  03/18/2018 CLINICAL DATA:  77 year old male with a history of generalized weakness for 2 days EXAM: CHEST - 2 VIEW COMPARISON:  02/28/2018 FINDINGS: Cardiomediastinal silhouette unchanged with cardiomegaly and atherosclerosis of the aorta. Surgical changes of median sternotomy. Interlobular septal thickening with fullness of the vasculature. Blunting at the costophrenic angle and cardiophrenic angle. Minimal meniscus on the lateral view. No displaced fracture. IMPRESSION: Early CHF with small right pleural effusion. Surgical changes of median sternotomy Electronically Signed   By: Corrie Mckusick D.O.   On: 03/18/2018 13:40    Cardiac Studies     Patient Profile     77 y.o. male with a history of acute on chronic combined heart failure, new atrial flutter, recent GI bleeding, severe peripheral arterial disease, coronary artery disease is admitted with worsening weakness and leg swelling.  Assessment & Plan    1.  Acute on chronic combined systolic and diastolic congestive heart failure: Wt today is 112.9 kg.    His I's / O's may not be accurate.    Wt Readings from Last 3 Encounters:  03/20/18 112.9 kg  03/17/18 118 kg  03/15/18 118.8 kg   Continue current diuretics.  Will try to set him up for a transesophageal echocardiogram/cardioversion early this coming week.  2.  Atrial flutter: Patient remains in atrial flutter with a 2-1 block.  I was not able to increase his AV block with carotid sinus massage.  We will try to schedule him for a TEE/cardioversion this week.   Been on Eliquis.  3.  Coronary artery disease: She remains stable.  Is not having any episodes of angina.  Continue medical therapy for now.       For questions or updates, please contact [place] Please consult [email or web address] for contact info under        Signed, Mertie Moores, MD  03/20/2018, 9:48 AM

## 2018-03-20 NOTE — Significant Event (Addendum)
Rapid Response Event Note  Overview:  Called for lethargy and trouble speaking  Time Called: 1756 Arrival Time: 1805 Event Type: Neurologic  Initial Focused Assessment: On arrival patient responding to voice - answering questions appropriately - speech a little slow but no aphasia and no dysarthria - moves all extremities to command - no focal deficits neurologically - he does have some jitters of both arms - wife states this is not new but seems worse.  RN Alda Lea states patient has slept hard this afternoon.Skin warm and dry.  Skin color with mild yellow tint.  Monitor shows regular rhythm tachycardic - may be flutter but is regular.  BP soft 98/58 HR 122 RR 22 - shallow. Bil BS decreased air movement.    CBG 201.  Further info from wife she states sometimes at home he is slow to wake up when he sleeps in the bed - normally he sleeps in a chair.  Large amount urine in urine bag - dark yellow.  Bil lower leg with redness and edema from known cellulitis.     Interventions:  ABG ordered.  Rectal temp 102.7.  RN Yoko speaking with Dr. Thereasa Solo.  Orders per MD.  Patient awake - complains of generalized pain all over - "achy."  UA was sent this AM.  MD updated with temp.  BP soft.   Blood cultures ordered and abx per MD.  Alert now - asking for water - sips without problems.  ABG results noted - no hypercarbia.    Plan of Care (if not transferred):  [place] BP every hour x 2 - then q 4 if stable.  Prn Bipap as needed per MD order.  To call RRT as needed.  Will follow.    Event Summary: Name of Physician Notified: Dr. Thereasa Solo at 1815    at    Outcome: Stayed in room and stabalized  Event End Time: 1915  Quin Hoop

## 2018-03-21 DIAGNOSIS — I4892 Unspecified atrial flutter: Secondary | ICD-10-CM

## 2018-03-21 DIAGNOSIS — Z951 Presence of aortocoronary bypass graft: Secondary | ICD-10-CM

## 2018-03-21 DIAGNOSIS — N183 Chronic kidney disease, stage 3 (moderate): Secondary | ICD-10-CM

## 2018-03-21 LAB — CBC
HCT: 30.9 % — ABNORMAL LOW (ref 39.0–52.0)
Hemoglobin: 9.5 g/dL — ABNORMAL LOW (ref 13.0–17.0)
MCH: 27.1 pg (ref 26.0–34.0)
MCHC: 30.7 g/dL (ref 30.0–36.0)
MCV: 88.3 fL (ref 80.0–100.0)
NRBC: 0 % (ref 0.0–0.2)
PLATELETS: 257 10*3/uL (ref 150–400)
RBC: 3.5 MIL/uL — ABNORMAL LOW (ref 4.22–5.81)
RDW: 16.5 % — AB (ref 11.5–15.5)
WBC: 9.2 10*3/uL (ref 4.0–10.5)

## 2018-03-21 LAB — COMPREHENSIVE METABOLIC PANEL
ALK PHOS: 39 U/L (ref 38–126)
ALT: 29 U/L (ref 0–44)
AST: 50 U/L — AB (ref 15–41)
Albumin: 2.2 g/dL — ABNORMAL LOW (ref 3.5–5.0)
Anion gap: 12 (ref 5–15)
BUN: 97 mg/dL — AB (ref 8–23)
CO2: 27 mmol/L (ref 22–32)
CREATININE: 2.84 mg/dL — AB (ref 0.61–1.24)
Calcium: 8.3 mg/dL — ABNORMAL LOW (ref 8.9–10.3)
Chloride: 92 mmol/L — ABNORMAL LOW (ref 98–111)
GFR, EST AFRICAN AMERICAN: 23 mL/min — AB (ref >60)
GFR, EST NON AFRICAN AMERICAN: 20 mL/min — AB (ref >60)
Glucose, Bld: 193 mg/dL — ABNORMAL HIGH (ref 70–99)
Potassium: 4.2 mmol/L (ref 3.5–5.1)
Sodium: 131 mmol/L — ABNORMAL LOW (ref 135–145)
Total Bilirubin: 1.1 mg/dL (ref 0.3–1.2)
Total Protein: 5.8 g/dL — ABNORMAL LOW (ref 6.5–8.1)

## 2018-03-21 LAB — FOLATE: Folate: 18.1 ng/mL (ref >5.9)

## 2018-03-21 LAB — GLUCOSE, CAPILLARY
Glucose-Capillary: 187 mg/dL — ABNORMAL HIGH (ref 70–99)
Glucose-Capillary: 246 mg/dL — ABNORMAL HIGH (ref 70–99)
Glucose-Capillary: 255 mg/dL — ABNORMAL HIGH (ref 70–99)
Glucose-Capillary: 139 mg/dL — ABNORMAL HIGH (ref 70–99)

## 2018-03-21 LAB — VITAMIN B12: Vitamin B-12: 3203 pg/mL — ABNORMAL HIGH (ref 180–914)

## 2018-03-21 LAB — AMMONIA: Ammonia: 25 umol/L (ref 9–35)

## 2018-03-21 MED ORDER — SODIUM CHLORIDE 0.9 % IV SOLN
INTRAVENOUS | Status: DC
  Administered 2018-03-22: 06:00:00 via INTRAVENOUS

## 2018-03-21 NOTE — Anesthesia Preprocedure Evaluation (Addendum)
Anesthesia Evaluation  Patient identified by MRN, date of birth, ID band Patient awake    Reviewed: Allergy & Precautions, NPO status , Patient's Chart, lab work & pertinent test results  Airway Mallampati: II  TM Distance: >3 FB Neck ROM: Full    Dental no notable dental hx. (+) Teeth Intact, Dental Advisory Given   Pulmonary shortness of breath and with exertion, former smoker,    Pulmonary exam normal breath sounds clear to auscultation       Cardiovascular hypertension, Pt. on home beta blockers and Pt. on medications + CAD, + CABG (2013), + Peripheral Vascular Disease, +CHF and + DVT  Normal cardiovascular exam+ dysrhythmias Atrial Fibrillation  Rhythm:Irregular Rate:Normal  TTE 01/2018 - Left ventricle: The cavity size was normal. There was moderate concentric hypertrophy. Systolic function was mildly to moderately reduced. The estimated ejection fraction was in the range of 40% to 45%. Anterior hypokinesis. Grade 2 DD with high LV filling pressure. - Aortic valve: Calcified without stenosis. Mean gradient (S): 6 mm Hg. - Left atrium: Moderately dilated. - Right atrium: The atrium was mildly dilated. - Inferior vena cava: The vessel was normal in size. The   respirophasic diameter changes were in the normal range (>= 50%), consistent with normal central venous pressure.  Impressions: - Technically difficult study. LVEF 40-45%, more significant anterior hypokinesis, grade 2 DD, high LV filling pressure, moderate LAE, mild RAE.   Neuro/Psych negative neurological ROS  negative psych ROS   GI/Hepatic Neg liver ROS, GERD  ,  Endo/Other  diabetes, Type 2, Oral Hypoglycemic AgentsHypothyroidism   Renal/GU negative Renal ROS  negative genitourinary   Musculoskeletal  (+) Arthritis ,   Abdominal   Peds  Hematology  (+) Blood dyscrasia, anemia ,   Anesthesia Other Findings On eliquis  Reproductive/Obstetrics                            Anesthesia Physical Anesthesia Plan  ASA: III  Anesthesia Plan: General   Post-op Pain Management:    Induction: Intravenous  PONV Risk Score and Plan: 2 and Propofol infusion and Treatment may vary due to age or medical condition  Airway Management Planned: Natural Airway and Simple Face Mask  Additional Equipment:   Intra-op Plan:   Post-operative Plan:   Informed Consent: I have reviewed the patients History and Physical, chart, labs and discussed the procedure including the risks, benefits and alternatives for the proposed anesthesia with the patient or authorized representative who has indicated his/her understanding and acceptance.   Dental advisory given  Plan Discussed with: CRNA  Anesthesia Plan Comments:        Anesthesia Quick Evaluation

## 2018-03-21 NOTE — Progress Notes (Signed)
[facility] TEAM 1 - Stepdown/ICU TEAM  CONLAN MICELI  RKY:[id number] DOB: 1940-05-27 DOA: 03/18/2018 PCP: Lavone Orn, MD    Brief Narrative:  77 y.o. male with a hx of systolic heart failure, atrial flutter, recent GI bleed, CAD s/p CABG, hypothyroidism, DM, GERD, HTN, HLD, stage III/stage IV CKD, and PAD who was recently discharged from the hospital after a GI bleed who presented w/ generalized weakness, and inability to ambulate, and shortness of breath.    In the ED he was found to be tachycardic with a heart rate of 122/min, and hypoxic requiring 4 L of nasal cannula oxygen.  Significant Events: 10/25 admit   Subjective: Developed fever yesterday evening to 102. UA c/w UTI. Empiric abx initiated to cover [facility] acquired organisms.  No more fever thus far. Blood cx negative thus far. Much more alert and conversant today. Looks much better overall. Denies sob, n/v, or abdom pain. Does report some intermittent epigastric discomfort.   Assessment & Plan:  Acute exacerbation of chronic systolic and diastolic CHF EF 31-51% w/ grade 2 DD per TTE 02/09/18 - continue to diuresis as able - remains grossly overloaded on exam - follow weights and strict Is/Os - renal function may prove to be a limiting step as crt is rising - Cardiology following w/ [place]  Filed Weights   03/19/18 0446 03/20/18 0500 03/21/18 0320  Weight: 115.9 kg 112.9 kg 115.5 kg    Atrial flutter - Atrial Fib Rate still not yet controlled but slightly improved - Cardiology is directing care - Eliquis was held during recent admit for GIB, but resumed at time of d/c - for TEE DCCV in AM   Low protein state - low oncotic pressure Albumin 2.2  CKD Stage 3 baseline crt even earlier this month was 1.8-2.0, though has recently been as high as 2.6 in setting of aggressive diuresis - follow trend daily - may interfere w/ attempts to continue diuresis - crt continues to trend upward   Recent Labs  Lab 03/17/18 1627 03/18/18 1325  03/19/18 0540 03/20/18 0537 03/21/18 0509  CREATININE 2.40* 2.51* 2.31* 2.72* 2.84*     Hypokalemia Due to high dose diuretic - corrected   Hyponatremia Due to CHF - follow w/ ongoing diuresis   DM2 CBG variable - follow w/o change for now but will likely require med titration soon   HLD  Anemia of chronic kidney disease and recent GIB Follow trend - Hgb stable    GIB Oct 2019 EGD 03/10/18 w/o clear source of bleeding   CAD s/p CABG Has suspected graft failure - not a candidate for cardiac cath given kidney disease   Cystic lesion of pancreas Noted on MRI abdom earlier this month - mildly elevated CA 19-9 not felt to be worrisome per GI   General Decline Pt has suffered a significant generalized decline as witnessed by family over the last few weeks - this is his 3rd admission just this month - family acknowledges that the pt appears to be loosing ground with increasing speed - we spoke at length and I have informed them that he may be approaching a point at which his CHF can not be managed effectively    DVT prophylaxis: eliquis  Code Status: FULL CODE Family Communication: spoke w/ daughter and wife at length at bedside   Disposition Plan: SDU  Consultants:  [facility] Cardiology  Antimicrobials:  none   Objective: Blood pressure 118/76, pulse (!) 116, temperature (!) 97.2 F (36.2 C), temperature  source Oral, resp. rate (!) 22, weight 115.5 kg, SpO2 100 %.  Intake/Output Summary (Last 24 hours) at 03/21/2018 1604 Last data filed at 03/21/2018 1200 Gross per 24 hour  Intake 616.21 ml  Output 2175 ml  Net -1558.79 ml   Filed Weights   03/19/18 0446 03/20/18 0500 03/21/18 0320  Weight: 115.9 kg 112.9 kg 115.5 kg    Examination: General: No acute respiratory distress - much more alert  Lungs: poor air movement B bases  Cardiovascular: irreg irreg - rate 110-120  Abdomen: Nontender, obese, soft, BS+, no rebound Extremities: 3+ B LE edema w/o signif change    CBC: Recent Labs  Lab 03/18/18 1325 03/19/18 0540 03/20/18 0537 03/21/18 0509  WBC 8.6 9.2 9.4 9.2  NEUTROABS 7.5  --   --   --   HGB 8.8* 8.9* 9.4* 9.5*  HCT 27.7* 27.9* 30.4* 30.9*  MCV 88.8 87.2 88.6 88.3  PLT 235 236 253 993   Basic Metabolic Panel: Recent Labs  Lab 03/17/18 1627  03/18/18 1325  03/19/18 0540 03/20/18 0537 03/21/18 0509  NA 132*   < > 130*  --  131* 132* 131*  K 3.2*  --  2.4*   < > 2.6* 3.2* 4.2  CL 89*  --  92*  --  89* 88* 92*  CO2 26  --  24  --  28 29 27   GLUCOSE 267*   < > 224*  --  213* 173* 193*  BUN 67*   < > 75*  --  73* 88* 97*  CREATININE 2.40*  --  2.51*  --  2.31* 2.72* 2.84*  CALCIUM 8.7  --  8.2*  --  8.4* 8.6* 8.3*  MG 2.0  --  1.9  --   --  2.3  --    < > = values in this interval not displayed.   GFR: Estimated Creatinine Clearance: 27.7 mL/min (A) (by C-G formula based on SCr of 2.84 mg/dL (H)).  Liver Function Tests: Recent Labs  Lab 03/17/18 1627 03/18/18 1325 03/20/18 0537 03/21/18 0509  AST 20 26 49* 50*  ALT 11 13 22 29   ALKPHOS 40 32* 40 39  BILITOT 0.7 1.3* 1.2 1.1  PROT 6.3 5.6* 5.7* 5.8*  ALBUMIN 3.4* 2.5* 2.2* 2.2*    Cardiac Enzymes: Recent Labs  Lab 03/18/18 1325  TROPONINI 0.03*    HbA1C: Hgb A1c MFr Bld  Date/Time Value Ref Range Status  10/21/2017 12:33 PM 6.2 (H) 4.8 - 5.6 % Final    Comment:    (NOTE) Pre diabetes:          5.7%-6.4% Diabetes:              >6.4% Glycemic control for   <7.0% adults with diabetes   09/24/2011 05:50 AM 6.4 (H) <5.7 % Final    Comment:    (NOTE)                                                                       According to the ADA Clinical Practice Recommendations for 2011, when HbA1c is used as a screening test:  >=6.5%   Diagnostic of Diabetes Mellitus           (if abnormal  result is confirmed) 5.7-6.4%   Increased risk of developing Diabetes Mellitus References:Diagnosis and Classification of Diabetes Mellitus,Diabetes DJSH,7026,37(CHYIF  1):S62-S69 and Standards of Medical Care in         Diabetes - 2011,Diabetes OYDX,4128,78 (Suppl 1):S11-S61.    CBG: Recent Labs  Lab 03/20/18 1635 03/20/18 1811 03/20/18 2146 03/21/18 0742 03/21/18 1124  GLUCAP 206* 201* 187* 187* 246*    Scheduled Meds: . amiodarone  200 mg Oral BID  . apixaban  5 mg Oral BID  . atorvastatin  20 mg Oral QPC supper  . carvedilol  25 mg Oral BID  . fenofibrate  54 mg Oral Daily  . gabapentin  300 mg Oral BID PC  . insulin aspart  0-20 Units Subcutaneous TID WC  . insulin aspart  0-5 Units Subcutaneous QHS  . levothyroxine  100 mcg Oral QAC breakfast  . pantoprazole  40 mg Oral Daily  . sodium chloride flush  3 mL Intravenous Q12H  . tamsulosin  0.4 mg Oral Q M,W,F   Continuous Infusions: . sodium chloride    . sodium chloride    . ceFEPime (MAXIPIME) IV    . furosemide Stopped (03/21/18 1013)     LOS: 3 days   Cherene Altes, MD [facility] Office  [phone] Pager - Text Page per Amion  If 7PM-7AM, please contact night-coverage per Amion 03/21/2018, 4:04 PM

## 2018-03-21 NOTE — Consult Note (Signed)
   [facility] Inpatient Consult   03/21/2018  [name] [birth year] [id number]  Patient screened for multiple admissions [place] [facility] services with patient's [facility] plan. Met with the patient, wife, [name], and daughter [name] at [place] regarding [place] [facility] services with Dr. Lavone Orn, as primary care provider. Wife states, "we get your information every time he comes in."  Explained services and daughter states she is very interested.  She states that her father needs assistance with managing his heart failure, Diabetes, and kidney disease. She is concerned for his new Atrial Flutter. She states that she is interested in knowiing if he does not improve could the [place] team assist in "navigating" him with hospice services. Also, explained how palliative care can assist, as well.   Explained that [facility] will work with patient on his goals. Also, left the [facility] Information packet with the daughter and wife to review.   Will follow progress and needs. For questions contact:   Natividad Brood, RN BSN [facility] Liaison  [phone] business mobile phone Toll free office [phone]

## 2018-03-21 NOTE — H&P (View-Only) (Signed)
DAILY PROGRESS NOTE   Patient Name: [name] Date of Encounter: 03/21/2018  Chief Complaint   Back pain  Patient Profile   77 y.o. male with a history of acute on chronic combined heart failure, new atrial flutter, recent GI bleeding, severe peripheral arterial disease, coronary artery disease is admitted with worsening weakness and leg swelling.  Subjective   Weight stable - Net even diuresis overnight. Remains in atrial flutter with RVR. Febrile overnight up to 102F - possible UTI, on rocephin.  Objective   Vitals:   03/20/18 1829 03/20/18 2030 03/21/18 0320 03/21/18 0801  BP:   119/60 131/83  Pulse:   (!) 110 (!) 116  Resp:   (!) 22   Temp: (!) 102.7 F (39.3 C) (!) 101.1 F (38.4 C) (!) 97.1 F (36.2 C) 98.2 F (36.8 C)  TempSrc: Rectal Rectal Rectal Axillary  SpO2: 99%   96%  Weight:   115.5 kg     Intake/Output Summary (Last 24 hours) at 03/21/2018 1050 Last data filed at 03/21/2018 0600 Gross per 24 hour  Intake 856.21 ml  Output 1300 ml  Net -443.79 ml   Filed Weights   03/19/18 0446 03/20/18 0500 03/21/18 0320  Weight: 115.9 kg 112.9 kg 115.5 kg    Physical Exam   General appearance: alert, mild distress and morbidly obese Neck: no carotid bruit, no JVD and thyroid not enlarged, symmetric, no tenderness/mass/nodules Lungs: diminished breath sounds bibasilar Heart: regular tachycardia Abdomen: soft, non-tender; bowel sounds normal; no masses,  no organomegaly Extremities: edema 2+ bilaterally, chronic venous stasis changes, eschar and PAD Pulses: 2+ and symmetric Skin: Skin color, texture, turgor normal. No rashes or lesions Neurologic: Mental status: Alert, oriented, thought content appropriate Psych: Pleasant  Inpatient Medications    Scheduled Meds: . amiodarone  200 mg Oral BID  . apixaban  5 mg Oral BID  . atorvastatin  20 mg Oral QPC supper  . carvedilol  25 mg Oral BID  . fenofibrate  54 mg Oral Daily  . gabapentin  300 mg  Oral BID PC  . insulin aspart  0-20 Units Subcutaneous TID WC  . insulin aspart  0-5 Units Subcutaneous QHS  . levothyroxine  100 mcg Oral QAC breakfast  . pantoprazole  40 mg Oral Daily  . sodium chloride flush  3 mL Intravenous Q12H  . tamsulosin  0.4 mg Oral Q M,W,F    Continuous Infusions: . sodium chloride    . ceFEPime (MAXIPIME) IV    . furosemide 120 mg (03/21/18 0913)    PRN Meds: sodium chloride, acetaminophen, acetaminophen, nitroGLYCERIN, ondansetron (ZOFRAN) IV, sodium chloride flush   Labs   Results for orders placed or performed during the hospital encounter of 03/18/18 (from the past 48 hour(s))  Glucose, capillary     Status: Abnormal   Collection Time: 03/19/18 11:53 AM  Result Value Ref Range   Glucose-Capillary 206 (H) 70 - 99 mg/dL  T4, free     Status: None   Collection Time: 03/19/18  1:25 PM  Result Value Ref Range   Free T4 1.72 0.82 - 1.77 ng/dL    Comment: (NOTE) Biotin ingestion may interfere with free T4 tests. If the results are inconsistent with the TSH level, previous test results, or the clinical presentation, then consider biotin interference. If needed, order repeat testing after stopping biotin. Performed at [facility], [place] [address], [place], [place] [address]   Glucose, capillary     Status: Abnormal  Collection Time: 03/19/18  4:44 PM  Result Value Ref Range   Glucose-Capillary 229 (H) 70 - 99 mg/dL  Glucose, capillary     Status: Abnormal   Collection Time: 03/19/18  9:14 PM  Result Value Ref Range   Glucose-Capillary 141 (H) 70 - 99 mg/dL  Basic metabolic panel     Status: Abnormal   Collection Time: 03/20/18  5:37 AM  Result Value Ref Range   Sodium 132 (L) 135 - 145 mmol/L   Potassium 3.2 (L) 3.5 - 5.1 mmol/L    Comment: DELTA CHECK NOTED   Chloride 88 (L) 98 - 111 mmol/L   CO2 29 22 - 32 mmol/L   Glucose, Bld 173 (H) 70 - 99 mg/dL   BUN 88 (H) 8 - 23 mg/dL   Creatinine, Ser 2.72 (H) 0.61 - 1.24 mg/dL    Calcium 8.6 (L) 8.9 - 10.3 mg/dL   GFR calc non Af Amer 21 (L) >60 mL/min   GFR calc Af Amer 24 (L) >60 mL/min    Comment: (NOTE) The eGFR has been calculated using the CKD EPI equation. This calculation has not been validated in all clinical situations. eGFR's persistently <60 mL/min signify possible Chronic Kidney Disease.    Anion gap 15 5 - 15    Comment: Performed at [facility] [address], [place], [place] [address]  Hepatic function panel     Status: Abnormal   Collection Time: 03/20/18  5:37 AM  Result Value Ref Range   Total Protein 5.7 (L) 6.5 - 8.1 g/dL   Albumin 2.2 (L) 3.5 - 5.0 g/dL   AST 49 (H) 15 - 41 U/L   ALT 22 0 - 44 U/L   Alkaline Phosphatase 40 38 - 126 U/L   Total Bilirubin 1.2 0.3 - 1.2 mg/dL   Bilirubin, Direct 0.6 (H) 0.0 - 0.2 mg/dL   Indirect Bilirubin 0.6 0.3 - 0.9 mg/dL    Comment: Performed at [facility] [address], [place], [place] [address]  Magnesium     Status: None   Collection Time: 03/20/18  5:37 AM  Result Value Ref Range   Magnesium 2.3 1.7 - 2.4 mg/dL    Comment: Performed at [facility] [address], [place], [place] [address]  CBC     Status: Abnormal   Collection Time: 03/20/18  5:37 AM  Result Value Ref Range   WBC 9.4 4.0 - 10.5 K/uL   RBC 3.43 (L) 4.22 - 5.81 MIL/uL   Hemoglobin 9.4 (L) 13.0 - 17.0 g/dL   HCT 30.4 (L) 39.0 - 52.0 %   MCV 88.6 80.0 - 100.0 fL   MCH 27.4 26.0 - 34.0 pg   MCHC 30.9 30.0 - 36.0 g/dL   RDW 16.2 (H) 11.5 - 15.5 %   Platelets 253 150 - 400 K/uL   nRBC 0.0 0.0 - 0.2 %    Comment: Performed at [facility], [place] [address] [place], [place] [address]  Lipase, blood     Status: None   Collection Time: 03/20/18  5:37 AM  Result Value Ref Range   Lipase 29 11 - 51 U/L    Comment: Performed at [facility] [address] [place], [place] [address]  Urinalysis, Routine w reflex microscopic     Status: Abnormal   Collection Time: 03/20/18  7:25 AM  Result  Value Ref Range   Color, Urine YELLOW YELLOW   APPearance CLOUDY (  A) CLEAR   Specific Gravity, Urine 1.008 1.005 - 1.030   pH 6.0 5.0 - 8.0   Glucose, UA NEGATIVE NEGATIVE mg/dL   Hgb urine dipstick LARGE (A) NEGATIVE   Bilirubin Urine NEGATIVE NEGATIVE   Ketones, ur NEGATIVE NEGATIVE mg/dL   Protein, ur 30 (A) NEGATIVE mg/dL   Nitrite NEGATIVE NEGATIVE   Leukocytes, UA LARGE (A) NEGATIVE   RBC / HPF >50 (H) 0 - 5 RBC/hpf   WBC, UA >50 (H) 0 - 5 WBC/hpf   Bacteria, UA FEW (A) NONE SEEN   Mucus PRESENT    Hyaline Casts, UA PRESENT     Comment: Performed at [facility] [address] [place], [place] [address]  Glucose, capillary     Status: Abnormal   Collection Time: 03/20/18  7:45 AM  Result Value Ref Range   Glucose-Capillary 181 (H) 70 - 99 mg/dL  Glucose, capillary     Status: Abnormal   Collection Time: 03/20/18 12:02 PM  Result Value Ref Range   Glucose-Capillary 235 (H) 70 - 99 mg/dL  Glucose, capillary     Status: Abnormal   Collection Time: 03/20/18  4:35 PM  Result Value Ref Range   Glucose-Capillary 206 (H) 70 - 99 mg/dL  Glucose, capillary     Status: Abnormal   Collection Time: 03/20/18  6:11 PM  Result Value Ref Range   Glucose-Capillary 201 (H) 70 - 99 mg/dL  Blood gas, arterial     Status: Abnormal   Collection Time: 03/20/18  6:35 PM  Result Value Ref Range   O2 Content 2.0 L/min   Delivery systems NASAL CANNULA    pH, Arterial 7.477 (H) 7.350 - 7.450   pCO2 arterial 38.6 32.0 - 48.0 mmHg   pO2, Arterial 97.0 83.0 - 108.0 mmHg   Bicarbonate 27.5 20.0 - 28.0 mmol/L   Acid-Base Excess 4.4 (H) 0.0 - 2.0 mmol/L   O2 Saturation 97.5 %   Patient temperature 102.7    Collection site RIGHT BRACHIAL    Drawn by [id number]    Sample type ARTERIAL DRAW   Glucose, capillary     Status: Abnormal   Collection Time: 03/20/18  9:46 PM  Result Value Ref Range   Glucose-Capillary 187 (H) 70 - 99 mg/dL  CBC     Status: Abnormal   Collection Time: 03/21/18   5:09 AM  Result Value Ref Range   WBC 9.2 4.0 - 10.5 K/uL   RBC 3.50 (L) 4.22 - 5.81 MIL/uL   Hemoglobin 9.5 (L) 13.0 - 17.0 g/dL   HCT 30.9 (L) 39.0 - 52.0 %   MCV 88.3 80.0 - 100.0 fL   MCH 27.1 26.0 - 34.0 pg   MCHC 30.7 30.0 - 36.0 g/dL   RDW 16.5 (H) 11.5 - 15.5 %   Platelets 257 150 - 400 K/uL   nRBC 0.0 0.0 - 0.2 %    Comment: Performed at [facility], [place] [address], [place], [place] [address]  Ammonia     Status: None   Collection Time: 03/21/18  5:09 AM  Result Value Ref Range   Ammonia 25 9 - 35 umol/L    Comment: Performed at [facility], [place] [address], [place], [place] [address]  Vitamin B12     Status: Abnormal   Collection Time: 03/21/18  5:09 AM  Result Value Ref Range   Vitamin B-12 3,203 (H) 180 - 914 pg/mL    Comment: (NOTE) This assay is  not validated for testing neonatal or myeloproliferative syndrome specimens for Vitamin B12 levels. Performed at [facility], [place] [address] [place], [place] [address]   Folate     Status: None   Collection Time: 03/21/18  5:09 AM  Result Value Ref Range   Folate 18.1 >5.9 ng/mL    Comment: Performed at [facility] [address], [place], [place] [address]  Comprehensive metabolic panel     Status: Abnormal   Collection Time: 03/21/18  5:09 AM  Result Value Ref Range   Sodium 131 (L) 135 - 145 mmol/L   Potassium 4.2 3.5 - 5.1 mmol/L   Chloride 92 (L) 98 - 111 mmol/L   CO2 27 22 - 32 mmol/L   Glucose, Bld 193 (H) 70 - 99 mg/dL   BUN 97 (H) 8 - 23 mg/dL   Creatinine, Ser 2.84 (H) 0.61 - 1.24 mg/dL   Calcium 8.3 (L) 8.9 - 10.3 mg/dL   Total Protein 5.8 (L) 6.5 - 8.1 g/dL   Albumin 2.2 (L) 3.5 - 5.0 g/dL   AST 50 (H) 15 - 41 U/L   ALT 29 0 - 44 U/L   Alkaline Phosphatase 39 38 - 126 U/L   Total Bilirubin 1.1 0.3 - 1.2 mg/dL   GFR calc non Af Amer 20 (L) >60 mL/min   GFR calc Af Amer 23 (L) >60 mL/min    Comment: (NOTE) The eGFR has been calculated using the CKD EPI equation. This  calculation has not been validated in all clinical situations. eGFR's persistently <60 mL/min signify possible Chronic Kidney Disease.    Anion gap 12 5 - 15    Comment: Performed at [facility] [address] [place], [place] [address]  Glucose, capillary     Status: Abnormal   Collection Time: 03/21/18  7:42 AM  Result Value Ref Range   Glucose-Capillary 187 (H) 70 - 99 mg/dL    ECG   N/A  Telemetry   Atrial flutter with RVR (2:1 conduction) - Personally Reviewed  Radiology    No results found.  Cardiac Studies   N/A  Assessment   1. Active Problems: 2.   Diabetes mellitus type 2 with complications ([facility]) 3.   S/P CABG (coronary artery bypass graft) 4.   Essential hypertension 5.   Chronic kidney disease, stage III (moderate) ([facility]) 6.   Volume overload 7.   Benign essential HTN 8.   New onset atrial fibrillation ([facility]) 9.   Hyponatremia 10.   GI bleed 11.   Symptomatic anemia 12.   Hyperlipidemia 13.   Combined systolic and diastolic heart failure ([facility]) 14.   Acute CHF (congestive heart failure) ([place]) 15.   Plan   1. Acute congestive heart failure in the setting of atrial flutter with RVR - now on Eliquis. History of GIB. Will need to monitor closely for bleeding - plan to pursue TEE/DCCV tomorrow am at 8:00. Keep NPO p MN - hopefully, will become afebrile overnight on antibiotics as this is also driving his atrial flutter. I discussed the risks, benefits and alternatives as well as the procedure with the patient and his wife/daughter and they are willing to proceed.  Time Spent Directly with Patient:  I have spent a total of 25 minutes with the patient reviewing hospital notes, telemetry, EKGs, labs and examining the patient as well as establishing an assessment and plan that was discussed personally with the patient.  > 50% of time was spent  in direct patient care.  Length of Stay:  LOS: 3 days   Pixie Casino, MD, [facility], [place] Director of the Advanced Lipid Disorders &  Cardiovascular Risk Reduction Clinic Diplomate of the American Board of Clinical Lipidology Attending Cardiologist  Direct Dial: [phone]  Fax: [phone]  Website:  [email or web address] Madalynne Gutmann 03/21/2018, 10:50 AM

## 2018-03-21 NOTE — Progress Notes (Signed)
Inpatient Diabetes Program Recommendations  AACE/ADA: New Consensus Statement on Inpatient Glycemic Control (2015)  Target Ranges:  Prepandial:   less than 140 mg/dL      Peak postprandial:   less than 180 mg/dL (1-2 hours)      Critically ill patients:  140 - 180 mg/dL   Lab Results  Component Value Date   GLUCAP 246 (H) 03/21/2018   HGBA1C 6.2 (H) 10/21/2017    Review of Glycemic ControlResults for HARTWELL, VANDIVER (MRN [id number]) as of 03/21/2018 11:35  Ref. Range 03/20/2018 16:35 03/20/2018 18:11 03/20/2018 21:46 03/21/2018 07:42 03/21/2018 11:24  Glucose-Capillary Latest Ref Range: 70 - 99 mg/dL 206 (H) 201 (H) 187 (H) 187 (H) 246 (H)    Diabetes history: Type 2 DM  Outpatient Diabetes medications:  Actos 30 mg daily Current orders for Inpatient glycemic control:  Novolog resistant tid with meals and HS Inpatient Diabetes Program Recommendations:   Note that blood sugars slightly > goal.  May consider adding Levemir 10 units daily while in the hospital.   **MD please consider d/c of Actos from d/c medications due to increased risk for fluid retention?   Thanks,  Adah Perl, RN, BC-ADM Inpatient Diabetes Coordinator Pager [phone] (8a-5p)

## 2018-03-21 NOTE — Progress Notes (Addendum)
DAILY PROGRESS NOTE   Patient Name: [name] Date of Encounter: 03/21/2018  Chief Complaint   Back pain  Patient Profile   77 y.o. male with a history of acute on chronic combined heart failure, new atrial flutter, recent GI bleeding, severe peripheral arterial disease, coronary artery disease is admitted with worsening weakness and leg swelling.  Subjective   Weight stable - Net even diuresis overnight. Remains in atrial flutter with RVR. Febrile overnight up to 102F - possible UTI, on rocephin.  Objective   Vitals:   03/20/18 1829 03/20/18 2030 03/21/18 0320 03/21/18 0801  BP:   119/60 131/83  Pulse:   (!) 110 (!) 116  Resp:   (!) 22   Temp: (!) 102.7 F (39.3 C) (!) 101.1 F (38.4 C) (!) 97.1 F (36.2 C) 98.2 F (36.8 C)  TempSrc: Rectal Rectal Rectal Axillary  SpO2: 99%   96%  Weight:   115.5 kg     Intake/Output Summary (Last 24 hours) at 03/21/2018 1050 Last data filed at 03/21/2018 0600 Gross per 24 hour  Intake 856.21 ml  Output 1300 ml  Net -443.79 ml   Filed Weights   03/19/18 0446 03/20/18 0500 03/21/18 0320  Weight: 115.9 kg 112.9 kg 115.5 kg    Physical Exam   General appearance: alert, mild distress and morbidly obese Neck: no carotid bruit, no JVD and thyroid not enlarged, symmetric, no tenderness/mass/nodules Lungs: diminished breath sounds bibasilar Heart: regular tachycardia Abdomen: soft, non-tender; bowel sounds normal; no masses,  no organomegaly Extremities: edema 2+ bilaterally, chronic venous stasis changes, eschar and PAD Pulses: 2+ and symmetric Skin: Skin color, texture, turgor normal. No rashes or lesions Neurologic: Mental status: Alert, oriented, thought content appropriate Psych: Pleasant  Inpatient Medications    Scheduled Meds: . amiodarone  200 mg Oral BID  . apixaban  5 mg Oral BID  . atorvastatin  20 mg Oral QPC supper  . carvedilol  25 mg Oral BID  . fenofibrate  54 mg Oral Daily  . gabapentin  300 mg  Oral BID PC  . insulin aspart  0-20 Units Subcutaneous TID WC  . insulin aspart  0-5 Units Subcutaneous QHS  . levothyroxine  100 mcg Oral QAC breakfast  . pantoprazole  40 mg Oral Daily  . sodium chloride flush  3 mL Intravenous Q12H  . tamsulosin  0.4 mg Oral Q M,W,F    Continuous Infusions: . sodium chloride    . ceFEPime (MAXIPIME) IV    . furosemide 120 mg (03/21/18 0913)    PRN Meds: sodium chloride, acetaminophen, acetaminophen, nitroGLYCERIN, ondansetron (ZOFRAN) IV, sodium chloride flush   Labs   Results for orders placed or performed during the hospital encounter of 03/18/18 (from the past 48 hour(s))  Glucose, capillary     Status: Abnormal   Collection Time: 03/19/18 11:53 AM  Result Value Ref Range   Glucose-Capillary 206 (H) 70 - 99 mg/dL  T4, free     Status: None   Collection Time: 03/19/18  1:25 PM  Result Value Ref Range   Free T4 1.72 0.82 - 1.77 ng/dL    Comment: (NOTE) Biotin ingestion may interfere with free T4 tests. If the results are inconsistent with the TSH level, previous test results, or the clinical presentation, then consider biotin interference. If needed, order repeat testing after stopping biotin. Performed at [facility], [place] [address], [place], [place] [address]   Glucose, capillary     Status: Abnormal  Collection Time: 03/19/18  4:44 PM  Result Value Ref Range   Glucose-Capillary 229 (H) 70 - 99 mg/dL  Glucose, capillary     Status: Abnormal   Collection Time: 03/19/18  9:14 PM  Result Value Ref Range   Glucose-Capillary 141 (H) 70 - 99 mg/dL  Basic metabolic panel     Status: Abnormal   Collection Time: 03/20/18  5:37 AM  Result Value Ref Range   Sodium 132 (L) 135 - 145 mmol/L   Potassium 3.2 (L) 3.5 - 5.1 mmol/L    Comment: DELTA CHECK NOTED   Chloride 88 (L) 98 - 111 mmol/L   CO2 29 22 - 32 mmol/L   Glucose, Bld 173 (H) 70 - 99 mg/dL   BUN 88 (H) 8 - 23 mg/dL   Creatinine, Ser 2.72 (H) 0.61 - 1.24 mg/dL    Calcium 8.6 (L) 8.9 - 10.3 mg/dL   GFR calc non Af Amer 21 (L) >60 mL/min   GFR calc Af Amer 24 (L) >60 mL/min    Comment: (NOTE) The eGFR has been calculated using the CKD EPI equation. This calculation has not been validated in all clinical situations. eGFR's persistently <60 mL/min signify possible Chronic Kidney Disease.    Anion gap 15 5 - 15    Comment: Performed at [facility] [address] [place], [place] [address]  Hepatic function panel     Status: Abnormal   Collection Time: 03/20/18  5:37 AM  Result Value Ref Range   Total Protein 5.7 (L) 6.5 - 8.1 g/dL   Albumin 2.2 (L) 3.5 - 5.0 g/dL   AST 49 (H) 15 - 41 U/L   ALT 22 0 - 44 U/L   Alkaline Phosphatase 40 38 - 126 U/L   Total Bilirubin 1.2 0.3 - 1.2 mg/dL   Bilirubin, Direct 0.6 (H) 0.0 - 0.2 mg/dL   Indirect Bilirubin 0.6 0.3 - 0.9 mg/dL    Comment: Performed at [facility] [address] [place], [place] [address]  Magnesium     Status: None   Collection Time: 03/20/18  5:37 AM  Result Value Ref Range   Magnesium 2.3 1.7 - 2.4 mg/dL    Comment: Performed at [facility] [address], [place], [place] [address]  CBC     Status: Abnormal   Collection Time: 03/20/18  5:37 AM  Result Value Ref Range   WBC 9.4 4.0 - 10.5 K/uL   RBC 3.43 (L) 4.22 - 5.81 MIL/uL   Hemoglobin 9.4 (L) 13.0 - 17.0 g/dL   HCT 30.4 (L) 39.0 - 52.0 %   MCV 88.6 80.0 - 100.0 fL   MCH 27.4 26.0 - 34.0 pg   MCHC 30.9 30.0 - 36.0 g/dL   RDW 16.2 (H) 11.5 - 15.5 %   Platelets 253 150 - 400 K/uL   nRBC 0.0 0.0 - 0.2 %    Comment: Performed at [facility], [place] [address], [place], [place] [address]  Lipase, blood     Status: None   Collection Time: 03/20/18  5:37 AM  Result Value Ref Range   Lipase 29 11 - 51 U/L    Comment: Performed at [facility] [address], [place],  [address]  Urinalysis, Routine w reflex microscopic     Status: Abnormal   Collection Time: 03/20/18  7:25 AM  Result  Value Ref Range   Color, Urine YELLOW YELLOW   APPearance CLOUDY (  A) CLEAR   Specific Gravity, Urine 1.008 1.005 - 1.030   pH 6.0 5.0 - 8.0   Glucose, UA NEGATIVE NEGATIVE mg/dL   Hgb urine dipstick LARGE (A) NEGATIVE   Bilirubin Urine NEGATIVE NEGATIVE   Ketones, ur NEGATIVE NEGATIVE mg/dL   Protein, ur 30 (A) NEGATIVE mg/dL   Nitrite NEGATIVE NEGATIVE   Leukocytes, UA LARGE (A) NEGATIVE   RBC / HPF >50 (H) 0 - 5 RBC/hpf   WBC, UA >50 (H) 0 - 5 WBC/hpf   Bacteria, UA FEW (A) NONE SEEN   Mucus PRESENT    Hyaline Casts, UA PRESENT     Comment: Performed at [facility] [address] [place], [place] [address]  Glucose, capillary     Status: Abnormal   Collection Time: 03/20/18  7:45 AM  Result Value Ref Range   Glucose-Capillary 181 (H) 70 - 99 mg/dL  Glucose, capillary     Status: Abnormal   Collection Time: 03/20/18 12:02 PM  Result Value Ref Range   Glucose-Capillary 235 (H) 70 - 99 mg/dL  Glucose, capillary     Status: Abnormal   Collection Time: 03/20/18  4:35 PM  Result Value Ref Range   Glucose-Capillary 206 (H) 70 - 99 mg/dL  Glucose, capillary     Status: Abnormal   Collection Time: 03/20/18  6:11 PM  Result Value Ref Range   Glucose-Capillary 201 (H) 70 - 99 mg/dL  Blood gas, arterial     Status: Abnormal   Collection Time: 03/20/18  6:35 PM  Result Value Ref Range   O2 Content 2.0 L/min   Delivery systems NASAL CANNULA    pH, Arterial 7.477 (H) 7.350 - 7.450   pCO2 arterial 38.6 32.0 - 48.0 mmHg   pO2, Arterial 97.0 83.0 - 108.0 mmHg   Bicarbonate 27.5 20.0 - 28.0 mmol/L   Acid-Base Excess 4.4 (H) 0.0 - 2.0 mmol/L   O2 Saturation 97.5 %   Patient temperature 102.7    Collection site RIGHT BRACHIAL    Drawn by [id number]    Sample type ARTERIAL DRAW   Glucose, capillary     Status: Abnormal   Collection Time: 03/20/18  9:46 PM  Result Value Ref Range   Glucose-Capillary 187 (H) 70 - 99 mg/dL  CBC     Status: Abnormal   Collection Time: 03/21/18   5:09 AM  Result Value Ref Range   WBC 9.2 4.0 - 10.5 K/uL   RBC 3.50 (L) 4.22 - 5.81 MIL/uL   Hemoglobin 9.5 (L) 13.0 - 17.0 g/dL   HCT 30.9 (L) 39.0 - 52.0 %   MCV 88.3 80.0 - 100.0 fL   MCH 27.1 26.0 - 34.0 pg   MCHC 30.7 30.0 - 36.0 g/dL   RDW 16.5 (H) 11.5 - 15.5 %   Platelets 257 150 - 400 K/uL   nRBC 0.0 0.0 - 0.2 %    Comment: Performed at [facility], [place] [address], [place], [place] [address]  Ammonia     Status: None   Collection Time: 03/21/18  5:09 AM  Result Value Ref Range   Ammonia 25 9 - 35 umol/L    Comment: Performed at [facility], [place] [address], [place], [place] [address]  Vitamin B12     Status: Abnormal   Collection Time: 03/21/18  5:09 AM  Result Value Ref Range   Vitamin B-12 3,203 (H) 180 - 914 pg/mL    Comment: (NOTE) This assay is  not validated for testing neonatal or myeloproliferative syndrome specimens for Vitamin B12 levels. Performed at [facility], [place] [address] [place], [place] [address]   Folate     Status: None   Collection Time: 03/21/18  5:09 AM  Result Value Ref Range   Folate 18.1 >5.9 ng/mL    Comment: Performed at [facility] [address], [place], [place] [address]  Comprehensive metabolic panel     Status: Abnormal   Collection Time: 03/21/18  5:09 AM  Result Value Ref Range   Sodium 131 (L) 135 - 145 mmol/L   Potassium 4.2 3.5 - 5.1 mmol/L   Chloride 92 (L) 98 - 111 mmol/L   CO2 27 22 - 32 mmol/L   Glucose, Bld 193 (H) 70 - 99 mg/dL   BUN 97 (H) 8 - 23 mg/dL   Creatinine, Ser 2.84 (H) 0.61 - 1.24 mg/dL   Calcium 8.3 (L) 8.9 - 10.3 mg/dL   Total Protein 5.8 (L) 6.5 - 8.1 g/dL   Albumin 2.2 (L) 3.5 - 5.0 g/dL   AST 50 (H) 15 - 41 U/L   ALT 29 0 - 44 U/L   Alkaline Phosphatase 39 38 - 126 U/L   Total Bilirubin 1.1 0.3 - 1.2 mg/dL   GFR calc non Af Amer 20 (L) >60 mL/min   GFR calc Af Amer 23 (L) >60 mL/min    Comment: (NOTE) The eGFR has been calculated using the CKD EPI equation. This  calculation has not been validated in all clinical situations. eGFR's persistently <60 mL/min signify possible Chronic Kidney Disease.    Anion gap 12 5 - 15    Comment: Performed at [facility] [address], [place], [place] [address]  Glucose, capillary     Status: Abnormal   Collection Time: 03/21/18  7:42 AM  Result Value Ref Range   Glucose-Capillary 187 (H) 70 - 99 mg/dL    ECG   N/A  Telemetry   Atrial flutter with RVR (2:1 conduction) - Personally Reviewed  Radiology    No results found.  Cardiac Studies   N/A  Assessment   1. Active Problems: 2.   Diabetes mellitus type 2 with complications ([facility]) 3.   S/P CABG (coronary artery bypass graft) 4.   Essential hypertension 5.   Chronic kidney disease, stage III (moderate) (HCC) 6.   Volume overload 7.   Benign essential HTN 8.   New onset atrial fibrillation ([place]) 9.   Hyponatremia 10.   GI bleed 11.   Symptomatic anemia 12.   Hyperlipidemia 13.   Combined systolic and diastolic heart failure ([place]) 14.   Acute CHF (congestive heart failure) ([place]) 15.   Plan   1. Acute congestive heart failure in the setting of atrial flutter with RVR - now on Eliquis. History of GIB. Will need to monitor closely for bleeding - plan to pursue TEE/DCCV tomorrow am at 8:00. Keep NPO p MN - hopefully, will become afebrile overnight on antibiotics as this is also driving his atrial flutter. I discussed the risks, benefits and alternatives as well as the procedure with the patient and his wife/daughter and they are willing to proceed.  Time Spent Directly with Patient:  I have spent a total of 25 minutes with the patient reviewing hospital notes, telemetry, EKGs, labs and examining the patient as well as establishing an assessment and plan that was discussed personally with the patient.  > 50% of time was spent  in direct patient care.  Length of Stay:  LOS: 3 days   Pixie Casino, MD, [facility], [place] Director of the Advanced Lipid Disorders &  Cardiovascular Risk Reduction Clinic Diplomate of the American Board of Clinical Lipidology Attending Cardiologist  Direct Dial: [phone]  Fax: [phone]  Website:  [email or web address] Evalyne Cortopassi 03/21/2018, 10:50 AM

## 2018-03-22 ENCOUNTER — Inpatient Hospital Stay (HOSPITAL_COMMUNITY): Payer: Medicare HMO | Admitting: Anesthesiology

## 2018-03-22 ENCOUNTER — Encounter (HOSPITAL_COMMUNITY)
Admission: EM | Disposition: A | Discharge status: Home Health | Payer: Self-pay | Source: Home / Self Care | Attending: Internal Medicine

## 2018-03-22 ENCOUNTER — Encounter (HOSPITAL_COMMUNITY): Payer: Self-pay | Admitting: *Deleted

## 2018-03-22 ENCOUNTER — Inpatient Hospital Stay (HOSPITAL_COMMUNITY): Payer: Medicare HMO

## 2018-03-22 DIAGNOSIS — I340 Nonrheumatic mitral (valve) insufficiency: Secondary | ICD-10-CM

## 2018-03-22 DIAGNOSIS — I4892 Unspecified atrial flutter: Secondary | ICD-10-CM

## 2018-03-22 HISTORY — PX: CARDIOVERSION: SHX1299

## 2018-03-22 HISTORY — PX: TEE WITHOUT CARDIOVERSION: SHX5443

## 2018-03-22 LAB — BLOOD CULTURE ID PANEL (REFLEXED)
ACINETOBACTER BAUMANNII: NOT DETECTED
CANDIDA ALBICANS: NOT DETECTED
CANDIDA PARAPSILOSIS: NOT DETECTED
Candida glabrata: NOT DETECTED
Candida krusei: NOT DETECTED
Candida tropicalis: NOT DETECTED
Carbapenem resistance: NOT DETECTED
ENTEROBACTERIACEAE SPECIES: DETECTED — AB
ESCHERICHIA COLI: NOT DETECTED
Enterobacter cloacae complex: NOT DETECTED
Enterococcus species: NOT DETECTED
Haemophilus influenzae: NOT DETECTED
KLEBSIELLA PNEUMONIAE: DETECTED — AB
Klebsiella oxytoca: NOT DETECTED
Listeria monocytogenes: NOT DETECTED
Neisseria meningitidis: NOT DETECTED
PSEUDOMONAS AERUGINOSA: NOT DETECTED
Proteus species: NOT DETECTED
STREPTOCOCCUS PYOGENES: NOT DETECTED
Serratia marcescens: NOT DETECTED
Staphylococcus aureus (BCID): NOT DETECTED
Staphylococcus species: NOT DETECTED
Streptococcus agalactiae: NOT DETECTED
Streptococcus pneumoniae: NOT DETECTED
Streptococcus species: NOT DETECTED

## 2018-03-22 LAB — CBC
HEMATOCRIT: 31.1 % — AB (ref 39.0–52.0)
Hemoglobin: 10 g/dL — ABNORMAL LOW (ref 13.0–17.0)
MCH: 27.9 pg (ref 26.0–34.0)
MCHC: 32.2 g/dL (ref 30.0–36.0)
MCV: 86.9 fL (ref 80.0–100.0)
Platelets: 281 10*3/uL (ref 150–400)
RBC: 3.58 MIL/uL — AB (ref 4.22–5.81)
RDW: 16.2 % — ABNORMAL HIGH (ref 11.5–15.5)
WBC: 10.8 10*3/uL — ABNORMAL HIGH (ref 4.0–10.5)
nRBC: 0.2 % (ref 0.0–0.2)

## 2018-03-22 LAB — GLUCOSE, CAPILLARY
Glucose-Capillary: 205 mg/dL — ABNORMAL HIGH (ref 70–99)
Glucose-Capillary: 208 mg/dL — ABNORMAL HIGH (ref 70–99)
Glucose-Capillary: 240 mg/dL — ABNORMAL HIGH (ref 70–99)
Glucose-Capillary: 218 mg/dL — ABNORMAL HIGH (ref 70–99)
Glucose-Capillary: 212 mg/dL — ABNORMAL HIGH (ref 70–99)

## 2018-03-22 LAB — BASIC METABOLIC PANEL
ANION GAP: 14 (ref 5–15)
BUN: 106 mg/dL — ABNORMAL HIGH (ref 8–23)
CHLORIDE: 87 mmol/L — AB (ref 98–111)
CO2: 25 mmol/L (ref 22–32)
Calcium: 8.4 mg/dL — ABNORMAL LOW (ref 8.9–10.3)
Creatinine, Ser: 2.92 mg/dL — ABNORMAL HIGH (ref 0.61–1.24)
GFR calc Af Amer: 22 mL/min — ABNORMAL LOW (ref >60)
GFR calc non Af Amer: 19 mL/min — ABNORMAL LOW (ref >60)
Glucose, Bld: 211 mg/dL — ABNORMAL HIGH (ref 70–99)
POTASSIUM: 4.7 mmol/L (ref 3.5–5.1)
Sodium: 126 mmol/L — ABNORMAL LOW (ref 135–145)

## 2018-03-22 SURGERY — CARDIOVERSION
Anesthesia: General

## 2018-03-22 MED ORDER — PROPOFOL 10 MG/ML IV BOLUS
INTRAVENOUS | Status: DC | PRN
  Administered 2018-03-22: 20 mg via INTRAVENOUS

## 2018-03-22 MED ORDER — GABAPENTIN 100 MG PO CAPS
100.0000 mg | ORAL_CAPSULE | Freq: Two times a day (BID) | ORAL | Status: DC
  Administered 2018-03-22 – 2018-04-06 (×29): 100 mg via ORAL
  Filled 2018-03-22 (×29): qty 1

## 2018-03-22 MED ORDER — FUROSEMIDE 10 MG/ML IJ SOLN
120.0000 mg | Freq: Three times a day (TID) | INTRAVENOUS | Status: DC
  Administered 2018-03-22: 120 mg via INTRAVENOUS
  Filled 2018-03-22 (×4): qty 12

## 2018-03-22 MED ORDER — EPHEDRINE SULFATE 50 MG/ML IJ SOLN
INTRAMUSCULAR | Status: DC | PRN
  Administered 2018-03-22: 5 mg via INTRAVENOUS

## 2018-03-22 MED ORDER — PROPOFOL 500 MG/50ML IV EMUL
INTRAVENOUS | Status: DC | PRN
  Administered 2018-03-22: 100 ug/kg/min via INTRAVENOUS

## 2018-03-22 MED ORDER — PHENYLEPHRINE HCL 10 MG/ML IJ SOLN
INTRAMUSCULAR | Status: DC | PRN
  Administered 2018-03-22: 80 ug via INTRAVENOUS
  Administered 2018-03-22: 120 ug via INTRAVENOUS
  Administered 2018-03-22: 80 ug via INTRAVENOUS
  Administered 2018-03-22 (×2): 160 ug via INTRAVENOUS
  Administered 2018-03-22: 80 ug via INTRAVENOUS
  Administered 2018-03-22: 160 ug via INTRAVENOUS

## 2018-03-22 MED ORDER — SODIUM CHLORIDE 0.9 % IV SOLN
INTRAVENOUS | Status: DC | PRN
  Administered 2018-03-22 (×2): via INTRAVENOUS

## 2018-03-22 MED ORDER — INSULIN GLARGINE 100 UNIT/ML ~~LOC~~ SOLN
8.0000 [IU] | Freq: Every day | SUBCUTANEOUS | Status: DC
  Administered 2018-03-22 – 2018-04-05 (×13): 8 [IU] via SUBCUTANEOUS
  Filled 2018-03-22 (×16): qty 0.08

## 2018-03-22 MED ORDER — SODIUM CHLORIDE 0.9 % IV SOLN
INTRAVENOUS | Status: DC | PRN
  Administered 2018-03-22: 40 ug/min via INTRAVENOUS

## 2018-03-22 MED ORDER — SODIUM CHLORIDE 0.9 % IV SOLN
2.0000 g | INTRAVENOUS | Status: DC
  Administered 2018-03-22 – 2018-03-23 (×2): 2 g via INTRAVENOUS
  Filled 2018-03-22 (×3): qty 20

## 2018-03-22 MED ORDER — LIDOCAINE HCL (CARDIAC) PF 100 MG/5ML IV SOSY
PREFILLED_SYRINGE | INTRAVENOUS | Status: DC | PRN
  Administered 2018-03-22: 100 mg via INTRAVENOUS

## 2018-03-22 MED ORDER — ZOLPIDEM TARTRATE 5 MG PO TABS
5.0000 mg | ORAL_TABLET | Freq: Once | ORAL | Status: AC
  Administered 2018-03-22: 5 mg via ORAL
  Filled 2018-03-22: qty 1

## 2018-03-22 MED ORDER — CARVEDILOL 3.125 MG PO TABS
3.1250 mg | ORAL_TABLET | Freq: Two times a day (BID) | ORAL | Status: DC
  Administered 2018-03-22 – 2018-04-06 (×27): 3.125 mg via ORAL
  Filled 2018-03-22 (×29): qty 1

## 2018-03-22 NOTE — Interval H&P Note (Signed)
History and Physical Interval Note:  03/22/2018 8:13 AM  [name]  has presented today for surgery, with the diagnosis of ATRIAL FIBRILLATION  The various methods of treatment have been discussed with the patient and family. After consideration of risks, benefits and other options for treatment, the patient has consented to  Procedure(s): CARDIOVERSION (N/A) TRANSESOPHAGEAL ECHOCARDIOGRAM (TEE) (N/A) as a surgical intervention .  The patient's history has been reviewed, patient examined, no change in status, stable for surgery.  I have reviewed the patient's chart and labs.  Questions were answered to the patient's satisfaction.     [employer]

## 2018-03-22 NOTE — Progress Notes (Signed)
PT Cancellation Note  Patient Details Name: [name] MRN: [id number] DOB: [birth year]   Cancelled Treatment:    Reason Eval/Treat Not Completed: Medical issues which prohibited therapy. Pt with symptomatic hypotension (84/66). Will follow-up for PT evaluation tomorrow.  Mabeline Caras, PT, DPT Acute Rehabilitation Services  Pager [phone] Office [place] 03/22/2018, 4:20 PM

## 2018-03-22 NOTE — Transfer of Care (Addendum)
Immediate Anesthesia Transfer of Care Note  Patient: [name]  Procedure(s) Performed: CARDIOVERSION (N/A ) TRANSESOPHAGEAL ECHOCARDIOGRAM (TEE) (N/A )  Patient Location: PACU  Anesthesia Type:General  Level of Consciousness: drowsy  Airway & Oxygen Therapy: Patient Spontanous Breathing and Patient connected to nasal cannula oxygen  Post-op Assessment: Report given to RN, Post -op Vital signs reviewed and stable and Patient moving all extremities  Post vital signs: Reviewed and stable  Last Vitals:  Vitals Value Taken Time  BP 99/65 03/22/2018  9:24 AM  Temp 36.5 C 03/22/2018  9:19 AM  Pulse 66 03/22/2018  9:24 AM  Resp 32 03/22/2018  9:24 AM  SpO2 99 % 03/22/2018  9:24 AM  Vitals shown include unvalidated device data.  Last Pain:  Vitals:   03/22/18 0716  TempSrc: Oral  PainSc: 0-No pain      Patients Stated Pain Goal: 0 (82/99/37 1696)  Complications: No apparent anesthesia complications

## 2018-03-22 NOTE — Plan of Care (Signed)
°  Problem: Clinical Measurements: °Goal: Respiratory complications will improve °Outcome: Progressing °Goal: Cardiovascular complication will be avoided °Outcome: Progressing °  °Problem: Activity: °Goal: Risk for activity intolerance will decrease °Outcome: Progressing °  °

## 2018-03-22 NOTE — Progress Notes (Signed)
[facility] TEAM 1 - Stepdown/ICU TEAM  KINTE TRIM  XBJ:[id number] DOB: 08/09/1940 DOA: 03/18/2018 PCP: Lavone Orn, MD    Brief Narrative:  77 y.o. male with a hx of systolic heart failure, atrial flutter, recent GI bleed, CAD s/p CABG, hypothyroidism, DM, GERD, HTN, HLD, stage III/stage IV CKD, and PAD who was recently discharged from the hospital after a GI bleed who presented w/ generalized weakness, and inability to ambulate, and shortness of breath.    In the ED he was found to be tachycardic with a heart rate of 122/min, and hypoxic requiring 4 L of nasal cannula oxygen.  Significant Events: 10/25 admit   Subjective: Sedate at the time of my visit. In on acute resp distress. Denies cp, n/v, or abdom pain.   Assessment & Plan:  Acute exacerbation of chronic systolic and diastolic CHF EF 30-86% w/ grade 2 DD per TTE 02/09/18 - continue to diuresis as able - still grossly overloaded on exam - follow weights and strict Is/Os - renal function may prove to be a limiting step as crt is rising - Cardiology following w/ [place] - will consider consulting CHF Team in AM (he has an appointment w/ them on Nov 9) - remains net positive for this admit if Is/Os are accurate - enact fluid restriction  Filed Weights   03/20/18 0500 03/21/18 0320 03/22/18 0300  Weight: 112.9 kg 115.5 kg 117.3 kg    Atrial flutter - Atrial Fib Cardiology is directing care - Eliquis was held during recent admit for GIB, but resumed at time of d/c - s/p TEE DCCV today, w/ pt holding NSR at time of my exam   UTI Cont to cover for nosocomial pathogens - f/u urine cx   Low protein state - low oncotic pressure Albumin 2.2  CKD Stage 3 baseline crt even earlier this month was 1.8-2.0, though has recently been as high as 2.6 in setting of aggressive diuresis - follow trend daily - may interfere w/ attempts to continue diuresis - crt continues to trend upward - discussed this at length w/ family today   Recent Labs   Lab 03/18/18 1325 03/19/18 0540 03/20/18 0537 03/21/18 0509 03/22/18 0320  CREATININE 2.51* 2.31* 2.72* 2.84* 2.92*     Hypokalemia corrected   Hyponatremia Due to CHF - not improving despite attempts to diurese - adjust diuretic further and fluid restrict   DM2 CBG variable - adjust tx today and follow   HLD  Anemia of chronic kidney disease and recent GIB Follow trend - Hgb stable    GIB Oct 2019 EGD 03/10/18 w/o clear source of bleeding   CAD s/p CABG Has suspected graft failure - not a candidate for cardiac cath given kidney disease   Cystic lesion of pancreas Noted on MRI abdom earlier this month - mildly elevated CA 19-9 not felt to be worrisome per GI   General Decline Pt has suffered a significant generalized decline as witnessed by family over the last few weeks - this is his 3rd admission just this month - family acknowledges that the pt appears to be loosing ground with increasing speed - we spoke at length and I have informed them that he may be approaching a point at which his CHF can not be managed effectively - Palliative to be consulted to assist w/ GOC    DVT prophylaxis: eliquis  Code Status: FULL CODE Family Communication: spoke w/ daughter and wife at length at bedside   Disposition Plan: SDU  Consultants:  [facility] Card[facility]  Antimicrobials:  none   Objective: Blood pressure 99/60, pulse 65, temperature 98 F (36.7 C), resp. rate 17, weight 117.3 kg, SpO2 99 %.  Intake/Output Summary (Last 24 hours) at 03/22/2018 1651 Last data filed at 03/22/2018 1300 Gross per 24 hour  Intake 834.68 ml  Output 450 ml  Net 384.68 ml   Filed Weights   03/20/18 0500 03/21/18 0320 03/22/18 0300  Weight: 112.9 kg 115.5 kg 117.3 kg    Examination: General: No acute respiratory distress - somnolent  Lungs: poor air movement B bases - no wheezing  Cardiovascular: RRR - HR 60 Abdomen: Nontender, obese, soft, BS+, no rebound Extremities: 3+ B LE edema    CBC: Recent Labs  Lab 03/18/18 1325  03/20/18 0537 03/21/18 0509 03/22/18 0320  WBC 8.6   < > 9.4 9.2 10.8*  NEUTROABS 7.5  --   --   --   --   HGB 8.8*   < > 9.4* 9.5* 10.0*  HCT 27.7*   < > 30.4* 30.9* 31.1*  MCV 88.8   < > 88.6 88.3 86.9  PLT 235   < > 253 257 281   < > = values in this interval not displayed.   Basic Metabolic Panel: Recent Labs  Lab 03/17/18 1627 03/18/18 1325  03/20/18 0537 03/21/18 0509 03/22/18 0320  NA 132* 130*   < > 132* 131* 126*  K 3.2* 2.4*   < > 3.2* 4.2 4.7  CL 89* 92*   < > 88* 92* 87*  CO2 26 24   < > 29 27 25   GLUCOSE 267* 224*   < > 173* 193* 211*  BUN 67* 75*   < > 88* 97* 106*  CREATININE 2.40* 2.51*   < > 2.72* 2.84* 2.92*  CALCIUM 8.7 8.2*   < > 8.6* 8.3* 8.4*  MG 2.0 1.9  --  2.3  --   --    < > = values in this interval not displayed.   GFR: Estimated Creatinine Clearance: 27.2 mL/min (A) (by C-G formula based on SCr of 2.92 mg/dL (H)).  Liver Function Tests: Recent Labs  Lab 03/17/18 1627 03/18/18 1325 03/20/18 0537 03/21/18 0509  AST 20 26 49* 50*  ALT 11 13 22 29   ALKPHOS 40 32* 40 39  BILITOT 0.7 1.3* 1.2 1.1  PROT 6.3 5.6* 5.7* 5.8*  ALBUMIN 3.4* 2.5* 2.2* 2.2*    Cardiac Enzymes: Recent Labs  Lab 03/18/18 1325  TROPONINI 0.03*    HbA1C: Hgb A1c MFr Bld  Date/Time Value Ref Range Status  10/21/2017 12:33 PM 6.2 (H) 4.8 - 5.6 % Final    Comment:    (NOTE) Pre diabetes:          5.7%-6.4% Diabetes:              >6.4% Glycemic control for   <7.0% adults with diabetes   09/24/2011 05:50 AM 6.4 (H) <5.7 % Final    Comment:    (NOTE)                                                                       According to the ADA Clinical Practice Recommendations for 2011, when HbA1c is  used as a screening test:  >=6.5%   Diagnostic of Diabetes Mellitus           (if abnormal result is confirmed) 5.7-6.4%   Increased risk of developing Diabetes Mellitus References:Diagnosis and Classification of  Diabetes Mellitus,Diabetes TGGY,6948,54(OEVOJ 1):S62-S69 and Standards of Medical Care in         Diabetes - 2011,Diabetes JKKX,3818,29 (Suppl 1):S11-S61.    CBG: Recent Labs  Lab 03/21/18 2205 03/22/18 0919 03/22/18 1131 03/22/18 1419 03/22/18 1643  GLUCAP 139* 205* 208* 240* 218*    Scheduled Meds: . amiodarone  200 mg Oral BID  . apixaban  5 mg Oral BID  . atorvastatin  20 mg Oral QPC supper  . carvedilol  25 mg Oral BID  . fenofibrate  54 mg Oral Daily  . gabapentin  300 mg Oral BID PC  . insulin aspart  0-20 Units Subcutaneous TID WC  . insulin aspart  0-5 Units Subcutaneous QHS  . levothyroxine  100 mcg Oral QAC breakfast  . pantoprazole  40 mg Oral Daily  . sodium chloride flush  3 mL Intravenous Q12H  . tamsulosin  0.4 mg Oral Q M,W,F   Continuous Infusions: . sodium chloride    . ceFEPime (MAXIPIME) IV 1 g (03/21/18 2022)  . furosemide Stopped (03/22/18 1142)     LOS: 4 days   Cherene Altes, MD [facility] Office  [phone] Pager - Text Page per Amion  If 7PM-7AM, please contact night-coverage per Amion 03/22/2018, 4:51 PM

## 2018-03-22 NOTE — Progress Notes (Signed)
Pt came back from PACU with HR in 60s.  Few minutes later his HR jumped up mid 90s and remined for a few minutes, then came down to 60s again.  Idolina Primer, RN

## 2018-03-22 NOTE — Progress Notes (Signed)
PHARMACY - PHYSICIAN COMMUNICATION CRITICAL VALUE ALERT - BLOOD CULTURE IDENTIFICATION (BCID)  [name] is an 77 y.o. male who presented to [facility] on 03/18/2018 with a chief complaint of worsening weakness and leg swelling  Assessment:   77 yo M spiked fever on 10/27. Blood cx now showing GNRs, BCID detected Kleb pneumo. Has been afebrile since on cefepime and WBC mostly stable. Primary mostly worried about UTI source with nosocomial pathogens so is currently on cefepime. Now that we have Kleb pneumo in the blood we will de-escalate to ceftriaxone.  Name of physician (or Provider) Contacted: K. Schorr  Current antibiotics: Cefepime  Changes to prescribed antibiotics recommended:  Stop cefepime. Start ceftriaxone 2g IV Q24h  Elenor Quinones, PharmD, BCPS Clinical Pharmacist Phone number [phone] 03/22/2018 7:19 PM  Results for orders placed or performed during the hospital encounter of 03/18/18  Blood Culture ID Panel (Reflexed) (Collected: 03/20/2018  7:18 PM)  Result Value Ref Range   Enterococcus species NOT DETECTED NOT DETECTED   Listeria monocytogenes NOT DETECTED NOT DETECTED   Staphylococcus species NOT DETECTED NOT DETECTED   Staphylococcus aureus (BCID) NOT DETECTED NOT DETECTED   Streptococcus species NOT DETECTED NOT DETECTED   Streptococcus agalactiae NOT DETECTED NOT DETECTED   Streptococcus pneumoniae NOT DETECTED NOT DETECTED   Streptococcus pyogenes NOT DETECTED NOT DETECTED   Acinetobacter baumannii NOT DETECTED NOT DETECTED   Enterobacteriaceae species DETECTED (A) NOT DETECTED   Enterobacter cloacae complex NOT DETECTED NOT DETECTED   Escherichia coli NOT DETECTED NOT DETECTED   Klebsiella oxytoca NOT DETECTED NOT DETECTED   Klebsiella pneumoniae DETECTED (A) NOT DETECTED   Proteus species NOT DETECTED NOT DETECTED   Serratia marcescens NOT DETECTED NOT DETECTED   Carbapenem resistance NOT DETECTED NOT DETECTED   Haemophilus influenzae NOT  DETECTED NOT DETECTED   Neisseria meningitidis NOT DETECTED NOT DETECTED   Pseudomonas aeruginosa NOT DETECTED NOT DETECTED   Candida albicans NOT DETECTED NOT DETECTED   Candida glabrata NOT DETECTED NOT DETECTED   Candida krusei NOT DETECTED NOT DETECTED   Candida parapsilosis NOT DETECTED NOT DETECTED   Candida tropicalis NOT DETECTED NOT DETECTED

## 2018-03-22 NOTE — Progress Notes (Signed)
Inpatient Diabetes Program Recommendations  AACE/ADA: New Consensus Statement on Inpatient Glycemic Control (2015)  Target Ranges:  Prepandial:   less than 140 mg/dL      Peak postprandial:   less than 180 mg/dL (1-2 hours)      Critically ill patients:  140 - 180 mg/dL   Lab Results  Component Value Date   GLUCAP 208 (H) 03/22/2018   HGBA1C 6.2 (H) 10/21/2017    Review of Glycemic Control Results for [name], [name] (MRN [id number]) as of 03/22/2018 13:13  Ref. Range 03/21/2018 22:05 03/22/2018 09:19 03/22/2018 11:31  Glucose-Capillary Latest Ref Range: 70 - 99 mg/dL 139 (H) 205 (H) 208 (H)   Diabetes history: Type 2 DM  Outpatient Diabetes medications: Actos 30 mg daily Current orders for Inpatient glycemic control: Novolog resistant tid with meals and HS  Inpatient Diabetes Program Recommendations:    Consider adding Levemir 10 units daily while in the hospital.    **MD please consider d/c of Actos from d/c medications due to increased risk for fluid retention?   Thanks, Bronson Curb, MSN, RNC-OB Diabetes Coordinator [phone] (8a-5p)

## 2018-03-22 NOTE — Progress Notes (Signed)
DAILY PROGRESS NOTE   Patient Name: [name] Date of Encounter: 03/22/2018  Chief Complaint   No complaints  Patient Profile   77 y.o. male with a history of acute on chronic combined heart failure, new atrial flutter, recent GI bleeding, severe peripheral arterial disease, coronary artery disease is admitted with worsening weakness and leg swelling.  Subjective   Underwent TEE/DCCV this morning - successfully. No LAA thrombus. LVEF 30% (down from 40%) - may be rate-related to flutter. Maintaining sinus.  Objective   Vitals:   03/22/18 0933 03/22/18 0939 03/22/18 0945 03/22/18 0948  BP: 106/65 (!) 94/48 94/64 101/61  Pulse: 67 67 64 65  Resp: 17 (!) 33 (!) 34 17  Temp:    98 F (36.7 C)  TempSrc:      SpO2: 100% 97% 97% 98%  Weight:        Intake/Output Summary (Last 24 hours) at 03/22/2018 1112 Last data filed at 03/22/2018 2774 Gross per 24 hour  Intake 1184 ml  Output 1325 ml  Net -141 ml   Filed Weights   03/20/18 0500 03/21/18 0320 03/22/18 0300  Weight: 112.9 kg 115.5 kg 117.3 kg    Physical Exam   General appearance: alert, no distress and morbidly obese Neck: no carotid bruit, no JVD and thyroid not enlarged, symmetric, no tenderness/mass/nodules Lungs: clear to auscultation bilaterally Heart: regular rate and rhythm and no murmur Abdomen: soft, non-tender; bowel sounds normal; no masses,  no organomegaly Extremities: edema 1+ edema, feet in support boots Pulses: 2+ and symmetric Skin: Skin color, texture, turgor normal. No rashes or lesions Neurologic: Grossly normal Psych: Pleasant  Inpatient Medications    Scheduled Meds: . amiodarone  200 mg Oral BID  . apixaban  5 mg Oral BID  . atorvastatin  20 mg Oral QPC supper  . carvedilol  25 mg Oral BID  . fenofibrate  54 mg Oral Daily  . gabapentin  300 mg Oral BID PC  . insulin aspart  0-20 Units Subcutaneous TID WC  . insulin aspart  0-5 Units Subcutaneous QHS  . levothyroxine  100  mcg Oral QAC breakfast  . pantoprazole  40 mg Oral Daily  . sodium chloride flush  3 mL Intravenous Q12H  . tamsulosin  0.4 mg Oral Q M,W,F    Continuous Infusions: . sodium chloride    . ceFEPime (MAXIPIME) IV 1 g (03/21/18 2022)  . furosemide 120 mg (03/22/18 1036)    PRN Meds: sodium chloride, acetaminophen, acetaminophen, nitroGLYCERIN, ondansetron (ZOFRAN) IV, sodium chloride flush   Labs   Results for orders placed or performed during the hospital encounter of 03/18/18 (from the past 48 hour(s))  Glucose, capillary     Status: Abnormal   Collection Time: 03/20/18 12:02 PM  Result Value Ref Range   Glucose-Capillary 235 (H) 70 - 99 mg/dL  Glucose, capillary     Status: Abnormal   Collection Time: 03/20/18  4:35 PM  Result Value Ref Range   Glucose-Capillary 206 (H) 70 - 99 mg/dL  Glucose, capillary     Status: Abnormal   Collection Time: 03/20/18  6:11 PM  Result Value Ref Range   Glucose-Capillary 201 (H) 70 - 99 mg/dL  Blood gas, arterial     Status: Abnormal   Collection Time: 03/20/18  6:35 PM  Result Value Ref Range   O2 Content 2.0 L/min   Delivery systems NASAL CANNULA    pH, Arterial 7.477 (H) 7.350 - 7.450   pCO2 arterial  38.6 32.0 - 48.0 mmHg   pO2, Arterial 97.0 83.0 - 108.0 mmHg   Bicarbonate 27.5 20.0 - 28.0 mmol/L   Acid-Base Excess 4.4 (H) 0.0 - 2.0 mmol/L   O2 Saturation 97.5 %   Patient temperature 102.7    Collection site RIGHT BRACHIAL    Drawn by [id number]    Sample type ARTERIAL DRAW   Culture, blood (x 2)     Status: None (Preliminary result)   Collection Time: 03/20/18  7:18 PM  Result Value Ref Range   Specimen Description BLOOD RIGHT ANTECUBITAL    Special Requests      BOTTLES DRAWN AEROBIC ONLY Blood Culture adequate volume   Culture      NO GROWTH < 24 HOURS Performed at [facility], [address] [place], [place] [address]    Report Status PENDING   Culture, blood (x 2)     Status: None (Preliminary result)   Collection  Time: 03/20/18  7:18 PM  Result Value Ref Range   Specimen Description BLOOD BLOOD RIGHT HAND    Special Requests      BOTTLES DRAWN AEROBIC ONLY Blood Culture adequate volume   Culture      NO GROWTH < 24 HOURS Performed at [facility], [place] [address], [place], [place] [address]    Report Status PENDING   Glucose, capillary     Status: Abnormal   Collection Time: 03/20/18  9:46 PM  Result Value Ref Range   Glucose-Capillary 187 (H) 70 - 99 mg/dL  CBC     Status: Abnormal   Collection Time: 03/21/18  5:09 AM  Result Value Ref Range   WBC 9.2 4.0 - 10.5 K/uL   RBC 3.50 (L) 4.22 - 5.81 MIL/uL   Hemoglobin 9.5 (L) 13.0 - 17.0 g/dL   HCT 30.9 (L) 39.0 - 52.0 %   MCV 88.3 80.0 - 100.0 fL   MCH 27.1 26.0 - 34.0 pg   MCHC 30.7 30.0 - 36.0 g/dL   RDW 16.5 (H) 11.5 - 15.5 %   Platelets 257 150 - 400 K/uL   nRBC 0.0 0.0 - 0.2 %    Comment: Performed at [facility], [place] [address] [place], [place] [address]  Ammonia     Status: None   Collection Time: 03/21/18  5:09 AM  Result Value Ref Range   Ammonia 25 9 - 35 umol/L    Comment: Performed at [facility], [place] [address], [place], [place] [address]  Vitamin B12     Status: Abnormal   Collection Time: 03/21/18  5:09 AM  Result Value Ref Range   Vitamin B-12 3,203 (H) 180 - 914 pg/mL    Comment: (NOTE) This assay is not validated for testing neonatal or myeloproliferative syndrome specimens for Vitamin B12 levels. Performed at [facility], [place] [address] [place], [place] [address]   Folate     Status: None   Collection Time: 03/21/18  5:09 AM  Result Value Ref Range   Folate 18.1 >5.9 ng/mL    Comment: Performed at [facility] [address] [place], [place] [address]  Comprehensive metabolic panel     Status: Abnormal   Collection Time: 03/21/18  5:09 AM  Result Value Ref Range   Sodium 131 (L) 135 - 145 mmol/L   Potassium 4.2 3.5 - 5.1 mmol/L   Chloride 92 (L) 98 - 111 mmol/L   CO2 27  22 -  32 mmol/L   Glucose, Bld 193 (H) 70 - 99 mg/dL   BUN 97 (H) 8 - 23 mg/dL   Creatinine, Ser 2.84 (H) 0.61 - 1.24 mg/dL   Calcium 8.3 (L) 8.9 - 10.3 mg/dL   Total Protein 5.8 (L) 6.5 - 8.1 g/dL   Albumin 2.2 (L) 3.5 - 5.0 g/dL   AST 50 (H) 15 - 41 U/L   ALT 29 0 - 44 U/L   Alkaline Phosphatase 39 38 - 126 U/L   Total Bilirubin 1.1 0.3 - 1.2 mg/dL   GFR calc non Af Amer 20 (L) >60 mL/min   GFR calc Af Amer 23 (L) >60 mL/min    Comment: (NOTE) The eGFR has been calculated using the CKD EPI equation. This calculation has not been validated in all clinical situations. eGFR's persistently <60 mL/min signify possible Chronic Kidney Disease.    Anion gap 12 5 - 15    Comment: Performed at [facility] [address], [place], [place] [address]  Glucose, capillary     Status: Abnormal   Collection Time: 03/21/18  7:42 AM  Result Value Ref Range   Glucose-Capillary 187 (H) 70 - 99 mg/dL  Glucose, capillary     Status: Abnormal   Collection Time: 03/21/18 11:24 AM  Result Value Ref Range   Glucose-Capillary 246 (H) 70 - 99 mg/dL  Glucose, capillary     Status: Abnormal   Collection Time: 03/21/18  5:21 PM  Result Value Ref Range   Glucose-Capillary 255 (H) 70 - 99 mg/dL  Glucose, capillary     Status: Abnormal   Collection Time: 03/21/18 10:05 PM  Result Value Ref Range   Glucose-Capillary 139 (H) 70 - 99 mg/dL  Basic metabolic panel     Status: Abnormal   Collection Time: 03/22/18  3:20 AM  Result Value Ref Range   Sodium 126 (L) 135 - 145 mmol/L   Potassium 4.7 3.5 - 5.1 mmol/L   Chloride 87 (L) 98 - 111 mmol/L   CO2 25 22 - 32 mmol/L   Glucose, Bld 211 (H) 70 - 99 mg/dL   BUN 106 (H) 8 - 23 mg/dL   Creatinine, Ser 2.92 (H) 0.61 - 1.24 mg/dL   Calcium 8.4 (L) 8.9 - 10.3 mg/dL   GFR calc non Af Amer 19 (L) >60 mL/min   GFR calc Af Amer 22 (L) >60 mL/min    Comment: (NOTE) The eGFR has been calculated using the CKD EPI equation. This calculation has not been  validated in all clinical situations. eGFR's persistently <60 mL/min signify possible Chronic Kidney Disease.    Anion gap 14 5 - 15    Comment: Performed at [facility] [address], [place], [place] [address]  CBC     Status: Abnormal   Collection Time: 03/22/18  3:20 AM  Result Value Ref Range   WBC 10.8 (H) 4.0 - 10.5 K/uL   RBC 3.58 (L) 4.22 - 5.81 MIL/uL   Hemoglobin 10.0 (L) 13.0 - 17.0 g/dL   HCT 31.1 (L) 39.0 - 52.0 %   MCV 86.9 80.0 - 100.0 fL   MCH 27.9 26.0 - 34.0 pg   MCHC 32.2 30.0 - 36.0 g/dL   RDW 16.2 (H) 11.5 - 15.5 %   Platelets 281 150 - 400 K/uL   nRBC 0.2 0.0 - 0.2 %    Comment: Performed at [facility], [place] [address], [place], [place] [address]  Glucose, capillary  Status: Abnormal   Collection Time: 03/22/18  9:19 AM  Result Value Ref Range   Glucose-Capillary 205 (H) 70 - 99 mg/dL    ECG   N/A  Telemetry   Sinus rhythm - Personally Reviewed  Radiology    No results found.  Cardiac Studies   Electrical Cardioversion Procedure Note [name] [id number] 07/10/1940  Procedure: Electrical Cardioversion Indications:  Atrial Flutter  Time Out: Verified patient identification, verified procedure,medications/allergies/relevent history reviewed, required imaging and test results available.  Performed  Procedure Details  The patient was NPO after midnight. Anesthesia was administered at the beside with propofol.  TEE was performed-left ventricle EF approximately 30%, mild mitral regurgitation, dilated LV and are the as well as LA and RA.  No evidence of left atrial appendage thrombus noted.  Transgastric views performed as well.  Aortic atherosclerosis noted.  Cardioversion was performed with synchronized biphasic defibrillation via AP pads with 120 joules.  1 attempt(s) were performed.  The patient converted to normal sinus rhythm. The patient tolerated the procedure well   IMPRESSION:  Successful cardioversion of  atrial fibrillation after TEE performed.  EF approximately 30%.    Candee Furbish 03/22/2018, 9:02 AM  Assessment   Active Problems:   Diabetes mellitus type 2 with complications (HCC)   S/P CABG (coronary artery bypass graft)   Essential hypertension   Chronic kidney disease, stage III (moderate) (HCC)   Volume overload   Benign essential HTN   New onset atrial fibrillation (HCC)   Hyponatremia   GI bleed   Symptomatic anemia   Hyperlipidemia   Combined systolic and diastolic heart failure (HCC)   Acute CHF (congestive heart failure) ([facility])   Plan   1. Cardioverted to sinus rhythm - no LAA thrombus. LVEF 30%. Will need continued medical optimization of HF. On amiodarone 200 mg BID and Eliquis 5 mg BID. Continue IV lasix 120 mg BID - monitor creatinine. On antibiotics for FUO.  Time Spent Directly with Patient:  I have spent a total of 15 minutes with the patient reviewing hospital notes, telemetry, EKGs, labs and examining the patient as well as establishing an assessment and plan that was discussed personally with the patient.  > 50% of time was spent in direct patient care.  Length of Stay:  LOS: 4 days   Pixie Casino, MD, [facility], [place] Director of the Advanced Lipid Disorders &  Cardiovascular Risk Reduction Clinic Diplomate of the American Board of Clinical Lipidology Attending Cardiologist  Direct Dial: [phone]  Fax: [phone]  Website:  [email or web address] [place] 03/22/2018, 11:12 AM

## 2018-03-22 NOTE — Anesthesia Postprocedure Evaluation (Signed)
Anesthesia Post Note  Patient: [name]  Procedure(s) Performed: CARDIOVERSION (N/A ) TRANSESOPHAGEAL ECHOCARDIOGRAM (TEE) (N/A )     Patient location during evaluation: PACU Anesthesia Type: General Level of consciousness: awake and alert Pain management: pain level controlled Vital Signs Assessment: post-procedure vital signs reviewed and stable Respiratory status: spontaneous breathing, nonlabored ventilation, respiratory function stable and patient connected to nasal cannula oxygen Cardiovascular status: blood pressure returned to baseline and stable Postop Assessment: no apparent nausea or vomiting Anesthetic complications: no    Last Vitals:  Vitals:   03/22/18 1500 03/22/18 1624  BP: 107/65 99/60  Pulse:  (!) 58  Resp:    Temp:    SpO2: 99% 99%    Last Pain:  Vitals:   03/22/18 1100  TempSrc: Oral  PainSc: 0-No pain                 Casidee Jann L Daesha Insco

## 2018-03-22 NOTE — Progress Notes (Signed)
  Echocardiogram Echocardiogram Transesophageal has been performed.  Darlina Sicilian M 03/22/2018, 9:03 AM

## 2018-03-22 NOTE — Progress Notes (Signed)
Patient arrived to PACU from endo on Neo running at 67mcg/min. Patient BP consistently 90's/60's. Patient trended in this range all last night according to previously charted VS. Neo titrated down and turned off at 0935. BP 106/65. Will continue to monitor.

## 2018-03-22 NOTE — CV Procedure (Signed)
    Electrical Cardioversion Procedure Note [name] [id number] 1940/08/26  Procedure: Electrical Cardioversion Indications:  Atrial Flutter  Time Out: Verified patient identification, verified procedure,medications/allergies/relevent history reviewed, required imaging and test results available.  Performed  Procedure Details  The patient was NPO after midnight. Anesthesia was administered at the beside with propofol.  TEE was performed-left ventricle EF approximately 30%, mild mitral regurgitation, dilated LV and are the as well as LA and RA.  No evidence of left atrial appendage thrombus noted.  Transgastric views performed as well.  Aortic atherosclerosis noted.  Cardioversion was performed with synchronized biphasic defibrillation via AP pads with 120 joules.  1 attempt(s) were performed.  The patient converted to normal sinus rhythm. The patient tolerated the procedure well   IMPRESSION:  Successful cardioversion of atrial fibrillation after TEE performed.  EF approximately 30%.    [name] 03/22/2018, 9:02 AM

## 2018-03-22 NOTE — Progress Notes (Signed)
Pt in bed.  Denied dizziness this time. Rechecked BP 99/60 on right arm lying.  Pulse Ox 99% on 2L via [place].  Idolina Primer, RN

## 2018-03-23 ENCOUNTER — Encounter (HOSPITAL_COMMUNITY): Payer: Self-pay | Admitting: Cardiology

## 2018-03-23 DIAGNOSIS — I4891 Unspecified atrial fibrillation: Secondary | ICD-10-CM

## 2018-03-23 DIAGNOSIS — I509 Heart failure, unspecified: Secondary | ICD-10-CM

## 2018-03-23 DIAGNOSIS — A4150 Gram-negative sepsis, unspecified: Secondary | ICD-10-CM

## 2018-03-23 DIAGNOSIS — N179 Acute kidney failure, unspecified: Secondary | ICD-10-CM

## 2018-03-23 DIAGNOSIS — N184 Chronic kidney disease, stage 4 (severe): Secondary | ICD-10-CM

## 2018-03-23 DIAGNOSIS — N390 Urinary tract infection, site not specified: Secondary | ICD-10-CM

## 2018-03-23 LAB — COMPREHENSIVE METABOLIC PANEL
ALT: 139 U/L — ABNORMAL HIGH (ref 0–44)
AST: 394 U/L — ABNORMAL HIGH (ref 15–41)
Albumin: 1.9 g/dL — ABNORMAL LOW (ref 3.5–5.0)
Alkaline Phosphatase: 41 U/L (ref 38–126)
Anion gap: 12 (ref 5–15)
BILIRUBIN TOTAL: 1 mg/dL (ref 0.3–1.2)
BUN: 120 mg/dL — ABNORMAL HIGH (ref 8–23)
CO2: 25 mmol/L (ref 22–32)
Calcium: 8.2 mg/dL — ABNORMAL LOW (ref 8.9–10.3)
Chloride: 89 mmol/L — ABNORMAL LOW (ref 98–111)
Creatinine, Ser: 3.33 mg/dL — ABNORMAL HIGH (ref 0.61–1.24)
GFR, EST AFRICAN AMERICAN: 19 mL/min — AB (ref >60)
GFR, EST NON AFRICAN AMERICAN: 16 mL/min — AB (ref >60)
Glucose, Bld: 171 mg/dL — ABNORMAL HIGH (ref 70–99)
Potassium: 4.7 mmol/L (ref 3.5–5.1)
Sodium: 126 mmol/L — ABNORMAL LOW (ref 135–145)
TOTAL PROTEIN: 5.5 g/dL — AB (ref 6.5–8.1)

## 2018-03-23 LAB — CBC
HEMATOCRIT: 31.1 % — AB (ref 39.0–52.0)
HEMOGLOBIN: 10 g/dL — AB (ref 13.0–17.0)
MCH: 27.6 pg (ref 26.0–34.0)
MCHC: 32.2 g/dL (ref 30.0–36.0)
MCV: 85.9 fL (ref 80.0–100.0)
NRBC: 0.4 % — AB (ref 0.0–0.2)
Platelets: 316 10*3/uL (ref 150–400)
RBC: 3.62 MIL/uL — ABNORMAL LOW (ref 4.22–5.81)
RDW: 16.2 % — AB (ref 11.5–15.5)
WBC: 10 10*3/uL (ref 4.0–10.5)

## 2018-03-23 LAB — GLUCOSE, CAPILLARY
GLUCOSE-CAPILLARY: 140 mg/dL — AB (ref 70–99)
GLUCOSE-CAPILLARY: 116 mg/dL — AB (ref 70–99)
GLUCOSE-CAPILLARY: 122 mg/dL — AB (ref 70–99)
Glucose-Capillary: 150 mg/dL — ABNORMAL HIGH (ref 70–99)

## 2018-03-23 MED ORDER — FUROSEMIDE 10 MG/ML IJ SOLN
160.0000 mg | Freq: Two times a day (BID) | INTRAVENOUS | Status: DC
  Administered 2018-03-23 – 2018-03-26 (×7): 160 mg via INTRAVENOUS
  Filled 2018-03-23: qty 16
  Filled 2018-03-23: qty 10
  Filled 2018-03-23 (×4): qty 16
  Filled 2018-03-23: qty 10
  Filled 2018-03-23: qty 16

## 2018-03-23 NOTE — Plan of Care (Signed)
  Problem: Clinical Measurements: Goal: Respiratory complications will improve Outcome: Not Progressing   Problem: Nutrition: Goal: Adequate nutrition will be maintained Outcome: Not Progressing   Problem: Elimination: Goal: Will not experience complications related to bowel motility Outcome: Not Progressing

## 2018-03-23 NOTE — Care Management Important Message (Signed)
Important Message  Patient Details  Name: [name] MRN: [id number] Date of Birth: 1941/01/22   Medicare Important Message Given:  Yes    Orbie Pyo 03/23/2018, 2:34 PM

## 2018-03-23 NOTE — Progress Notes (Signed)
[facility] TEAM 1 - Stepdown/ICU TEAM  [name]  [name]:[id number] DOB: Feb 09, 1941 DOA: 03/18/2018 PCP: Lavone Orn, MD    Brief Narrative:  77 y.o. male with a hx of systolic heart failure, atrial flutter, recent GI bleed, CAD s/p CABG, hypothyroidism, DM, GERD, HTN, HLD, stage III/stage IV CKD, and PAD who was recently discharged from the hospital after a GI bleed who presented w/ generalized weakness, and inability to ambulate, and shortness of breath.    On presentation to the hospital, patient was noted to be in atrial fibrillation with rapid ventricular response, acute kidney injury on chronic kidney disease stage III/IV, acute on chronic combined systolic and diastolic congestive heart failure with recent echocardiogram revealing EF of 30 to 09%, grade 2 diastolic dysfunction and decreased right ventricular systolic function.  UA is suggestive of likely UTI and blood culture is growing Klebsiella pneumonia, suspect likely secondary to UTI/sepsis.  Worsening mental status is noted.  Sodium is 126.  Patient remains volume overloaded, despite being on high-dose Lasix.  03/23/2018: BUN is 120 and serum creatinine is 3.3, with baseline serum creatinine of 1.8 to 2.  Albumin is 1.9, AST of 394, ALT of 139.  Patient was seen alongside patient's nurse and wife.  Updated patient's wife extensively.  Patient could not participate reasonably with a consultation due to altered mentation, possibly secondary to uremia.  To date, no GI bleed noted.  Nephrology and cardiology team consulted.  Guarded/poor prognosis, communicated to the patient's wife.  Will consult palliative care service.  Further management will depend on hospital course.  Subjective: Patient is lethargic, therefore, could not participate significantly in the consultation.   Assessment & Plan: Acute exacerbation of chronic combined systolic and diastolic CHF: -Consult heart failure team. -Query diuresis resistant. -BUN of 120,  serum creatinine of 3.3, and with likely uremia - query role of hemodialysis, but patient may not be a good candidate due to multiple cold morbidities. -Latest echo revealed EF of 30 to 35% (kindly see above). - Await further recommendation from the heart failure team. - Nephrology team also consulted.  Atrial flutter/Atrial Fib with rapid ventricular response: Cardiology is directing care  Eliquis was held during recent admit for GIB, but resumed at time of d/c  S/P TEE DCCV   UTI/bacteremia/sepsis, likely secondary to Klebsiella: Follow final cultures.   Continue IV Rocephin.   Cannot visualize urine culture  Hypoalbuminemia:  Indicate poor prognosis.    Acute kidney injury on chronic kidney disease stage III/IV:  Nephrology input is appreciated.  As per nephrology team, not ideal candidate for dialysis. We will continue to assess. Guarded prognosis.  Recent Labs  Lab 03/19/18 0540 03/20/18 0537 03/21/18 0509 03/22/18 0320 03/23/18 0417  CREATININE 2.31* 2.72* 2.84* 2.92* 3.33*     Hypokalemia: corrected   Hyponatremia: Due to CHF May have prognostic significance.   DM2: Continue to optimize.  HLD  Anemia of chronic kidney disease and recent GIB Follow trend - Hgb stable    GIB Oct 2019 EGD 03/10/18 w/o clear source of bleeding   CAD s/p CABG Has suspected graft failure - not a candidate for cardiac cath given kidney disease   Cystic lesion of pancreas Noted on MRI abdom earlier this month - mildly elevated CA 19-9 not felt to be worrisome per GI   Consult palliative team.   DVT prophylaxis: eliquis  Code Status: FULL CODE Family Communication: spoke wife.    Disposition Plan: Will depend on hospital course.  Consultants:  [facility] Cardiology Heart failure team. Nephrology.  Antimicrobials:  IV Rocephin.  Objective: Blood pressure 111/69, pulse (!) 58, temperature 98.8 F (37.1 C), temperature source Axillary, resp. rate 17, weight 115.5 kg,  SpO2 100 %.  Intake/Output Summary (Last 24 hours) at 03/23/2018 1525 Last data filed at 03/23/2018 1147 Gross per 24 hour  Intake 630 ml  Output 300 ml  Net 330 ml   Filed Weights   03/21/18 0320 03/22/18 0300 03/23/18 0528  Weight: 115.5 kg 117.3 kg 115.5 kg    Examination: General: Lethargic.  Probably uremic.   Lungs: Decreased air entry. Cardiovascular: S1-S2. Abdomen: Nontender, obese, soft, BS+, no rebound Extremities: 3+ B LE edema   CBC: Recent Labs  Lab 03/18/18 1325  03/21/18 0509 03/22/18 0320 03/23/18 0417  WBC 8.6   < > 9.2 10.8* 10.0  NEUTROABS 7.5  --   --   --   --   HGB 8.8*   < > 9.5* 10.0* 10.0*  HCT 27.7*   < > 30.9* 31.1* 31.1*  MCV 88.8   < > 88.3 86.9 85.9  PLT 235   < > 257 281 316   < > = values in this interval not displayed.   Basic Metabolic Panel: Recent Labs  Lab 03/17/18 1627 03/18/18 1325  03/20/18 0537 03/21/18 0509 03/22/18 0320 03/23/18 0417  NA 132* 130*   < > 132* 131* 126* 126*  K 3.2* 2.4*   < > 3.2* 4.2 4.7 4.7  CL 89* 92*   < > 88* 92* 87* 89*  CO2 26 24   < > 29 27 25 25   GLUCOSE 267* 224*   < > 173* 193* 211* 171*  BUN 67* 75*   < > 88* 97* 106* 120*  CREATININE 2.40* 2.51*   < > 2.72* 2.84* 2.92* 3.33*  CALCIUM 8.7 8.2*   < > 8.6* 8.3* 8.4* 8.2*  MG 2.0 1.9  --  2.3  --   --   --    < > = values in this interval not displayed.   GFR: Estimated Creatinine Clearance: 23.6 mL/min (A) (by C-G formula based on SCr of 3.33 mg/dL (H)).  Liver Function Tests: Recent Labs  Lab 03/18/18 1325 03/20/18 0537 03/21/18 0509 03/23/18 0417  AST 26 49* 50* 394*  ALT 13 22 29  139*  ALKPHOS 32* 40 39 41  BILITOT 1.3* 1.2 1.1 1.0  PROT 5.6* 5.7* 5.8* 5.5*  ALBUMIN 2.5* 2.2* 2.2* 1.9*    Cardiac Enzymes: Recent Labs  Lab 03/18/18 1325  TROPONINI 0.03*    HbA1C: Hgb A1c MFr Bld  Date/Time Value Ref Range Status  10/21/2017 12:33 PM 6.2 (H) 4.8 - 5.6 % Final    Comment:    (NOTE) Pre diabetes:           5.7%-6.4% Diabetes:              >6.4% Glycemic control for   <7.0% adults with diabetes   09/24/2011 05:50 AM 6.4 (H) <5.7 % Final    Comment:    (NOTE)  According to the ADA Clinical Practice Recommendations for 2011, when HbA1c is used as a screening test:  >=6.5%   Diagnostic of Diabetes Mellitus           (if abnormal result is confirmed) 5.7-6.4%   Increased risk of developing Diabetes Mellitus References:Diagnosis and Classification of Diabetes Mellitus,Diabetes XIHW,3888,28(MKLKJ 1):S62-S69 and Standards of Medical Care in         Diabetes - 2011,Diabetes ZPHX,5056,97 (Suppl 1):S11-S61.    CBG: Recent Labs  Lab 03/22/18 1419 03/22/18 1643 03/22/18 2109 03/23/18 0758 03/23/18 1142  GLUCAP 240* 218* 212* 150* 140*    Scheduled Meds: . amiodarone  200 mg Oral BID  . apixaban  5 mg Oral BID  . atorvastatin  20 mg Oral QPC supper  . carvedilol  3.125 mg Oral BID  . fenofibrate  54 mg Oral Daily  . gabapentin  100 mg Oral BID PC  . insulin aspart  0-20 Units Subcutaneous TID WC  . insulin aspart  0-5 Units Subcutaneous QHS  . insulin glargine  8 Units Subcutaneous QHS  . levothyroxine  100 mcg Oral QAC breakfast  . pantoprazole  40 mg Oral Daily  . tamsulosin  0.4 mg Oral Q M,W,F   Continuous Infusions: . cefTRIAXone (ROCEPHIN)  IV 2 g (03/22/18 2037)  . furosemide 120 mg (03/22/18 2119)     LOS: 5 days   Bonnell Public, MD [facility] Office  [phone] Pager - Text Page per Amion  If 7PM-7AM, please contact night-coverage per Amion 03/23/2018, 3:25 PM

## 2018-03-23 NOTE — Consult Note (Addendum)
Advanced Heart Failure Team Consult Note   Primary Physician: Lavone Orn, MD PCP-Cardiologist:  Sinclair Grooms, MD  Reason for Consultation: Heart Failure   HPI:    [name] is seen today for evaluation of heart failure at the request of Dr Marthenia Rolling.   Mr Apachito is a 77 year old with a history of chronic combined systolic/diastolic heart failure, PAF, GI bleed, PAD, memory impairment, CAD, CABG, hypterlipidemia, CKD, DMII.   Admitted 10/4 through 03/03/18 with volume overload.  Diuresed with IV lasix and transitioned to lasix 60 mg twice a day.   Admitted 03/08/18 with GI bleed. GI consulted. He had EGD with no definite source of bleeding. He has follow wp with Dr Marella Bile 11/2/12019. Discharged on 03/11/2018.    On 03/17/18 he was seen by Cecilie Kicks NP for cardiology follow up. He was in A fib and there was concern that this maybe contributing to lower EF. He had significant lower extremity edema. He was started amiodarone and metolazone was added. He continued on torsemide 60 mg twice a day.   He presented to [facility] on 10/25 with increased dyspnea and fatigue. BNP was up to 2440, creatinine was 2.5, BUN 76, and troponin was < 0.03. EKG showed A fib RVR.General Cardiology consulted.  He ws started on high dose IV lasix. On 10/29 he underwent TEE-DC-CV with conversion to NSR. EF on TEE was 30%. He has also been treated for UTI with ceftriaxone. Blood cultures with klebsiella pneumonia.    Renal function has worsened since admit. Poor urine output.  Creatinine 3.3 BUN up to 120. Nephrology was consulted today. LFTs also trending up. Today he is maintaining Sinus Loletha Grayer. He is confused today much worse than his baseline.   TEE 03/22/2018 Echo 30-35% RV moderately reduced. No LAA thrombus.   ECHO 01/2018  Technically difficult study. LVEF 40-45%, more significant   anterior hypokinesis, grade 2 DD, high LV filling pressure,   moderate LAE, mild RAE. Review of Systems: [y]  = yes, [ ]  = no   General: Weight gain [Y ]; Weight loss [ ] ; Anorexia [ ] ; Fatigue [Y ]; Fever [ ] ; Chills [ ] ; Weakness [ ]   Cardiac: Chest pain/pressure [ ] ; Resting SOB [ ] ; Exertional SOB [ ] ; Orthopnea [ ] ; Pedal Edema [Y ]; Palpitations [ ] ; Syncope [ ] ; Presyncope [ ] ; Paroxysmal nocturnal dyspnea[ ]   Pulmonary: Cough [ ] ; Wheezing[ ] ; Hemoptysis[ ] ; Sputum [ ] ; Snoring [ ]   GI: Vomiting[ ] ; Dysphagia[ ] ; Melena[ ] ; Hematochezia [ ] ; Heartburn[ ] ; Abdominal pain [ ] ; Constipation [ ] ; Diarrhea [ ] ; BRBPR [ ]   GU: Hematuria[ ] ; Dysuria [ ] ; Nocturia[ ]   Vascular: Pain in legs with walking [ ] ; Pain in feet with lying flat [ ] ; Non-healing sores [ ] ; Stroke [ ] ; TIA [ ] ; Slurred speech [ ] ;  Neuro: Headaches[ ] ; Vertigo[ ] ; Seizures[ ] ; Paresthesias[ ] ;Blurred vision [ ] ; Diplopia [ ] ; Vision changes [ ]   Ortho/Skin: Arthritis [ ] ; Joint pain [ Y]; Muscle pain [ ] ; Joint swelling [ ] ; Back Pain [ ] ; Rash [ ]   Psych: Depression[ ] ; Anxiety[ ]   Heme: Bleeding problems [ ] ; Clotting disorders [ ] ; Anemia [ ]   Endocrine: Diabetes [ ] ; Thyroid dysfunction[ ]   Home Medications Prior to Admission medications   Medication Sig Start Date End Date Taking? Authorizing Provider  acetaminophen (TYLENOL) 500 MG tablet Take 1,000 mg by mouth every 6 (six) hours as needed  for headache (pain).   Yes [provider]  apixaban (ELIQUIS) 5 MG TABS tablet Take 1 tablet (5 mg total) by mouth 2 (two) times daily. Patient taking differently: Take 5 mg by mouth 2 (two) times daily after a meal.  03/03/18 04/02/18 Yes Arrien, Jimmy Picket, MD  Ascorbic Acid (VITAMIN C PO) Take 1 tablet by mouth 2 (two) times daily.   Yes [provider]  atorvastatin (LIPITOR) 20 MG tablet Take 20 mg by mouth daily after supper.    Yes [provider]  carvedilol (COREG) 25 MG tablet Take 1 tablet (25 mg total) by mouth 2 (two) times daily. Patient taking differently: Take 25 mg by mouth 2 (two) times  daily after a meal.  03/03/18 04/02/18 Yes Arrien, Jimmy Picket, MD  fenofibrate 54 MG tablet Take 1 tablet (54 mg total) by mouth daily. 03/03/18 04/02/18 Yes Arrien, Jimmy Picket, MD  gabapentin (NEURONTIN) 300 MG capsule Take 300 mg by mouth 2 (two) times daily after a meal.    Yes [provider]  levothyroxine (SYNTHROID, LEVOTHROID) 100 MCG tablet Take 100 mcg by mouth daily before breakfast.  01/07/18  Yes [provider]  methylcellulose (ARTIFICIAL TEARS) 1 % ophthalmic solution Place 1 drop into both eyes 2 (two) times daily as needed (dry eyes).   Yes [provider]  metolazone (ZAROXOLYN) 5 MG tablet Take 5 mg by mouth See admin instructions. Take one tablet (5 mg) by mouth when directed to do so by cardiologist.   Yes [provider]  Multiple Vitamin (MULTIVITAMIN WITH MINERALS) TABS tablet Take 1 tablet by mouth daily. 12/03/17  Yes Rhyne, Samantha J, PA-C  nitroGLYCERIN (NITROSTAT) 0.4 MG SL tablet PLACE 1 TABLET UNDER TONGUE EVERY 5 MINUTES AS NEEDED FOR CHEST PAIN. Patient taking differently: Place 0.4 mg under the tongue every 5 (five) minutes as needed for chest pain.  09/17/15  Yes Belva Crome, MD  oxymetazoline (VICKS SINEX) 0.05 % nasal spray Place 1 spray into both nostrils 2 (two) times daily as needed for congestion.   Yes [provider]  pantoprazole (PROTONIX) 40 MG tablet Take 1 tablet (40 mg total) by mouth daily. 03/11/18 03/11/19 Yes Pokhrel, Laxman, MD  pioglitazone (ACTOS) 30 MG tablet Take 1 tablet (30 mg total) by mouth daily with lunch. Patient taking differently: Take 30 mg by mouth daily after supper.  12/03/17  Yes Rhyne, Samantha J, PA-C  potassium chloride SA (K-DUR,KLOR-CON) 20 MEQ tablet Take by mouth as directed. Patient taking differently: Take 20 mEq by mouth See admin instructions. Take one tablet (20 meq) by mouth when directed to do so by MD 03/18/18  Yes Isaiah Serge, NP  pseudoephedrine-guaifenesin  (MUCINEX D) 60-600 MG per tablet Take 1 tablet by mouth every 12 (twelve) hours as needed for congestion.    Yes [provider]  sodium chloride (OCEAN) 0.65 % SOLN nasal spray Place 1 spray into both nostrils 2 (two) times daily as needed for congestion.   Yes [provider]  tamsulosin (FLOMAX) 0.4 MG CAPS capsule Take 0.4 mg by mouth every Monday, Wednesday, and Friday.    Yes [provider]  torsemide (DEMADEX) 20 MG tablet Take 3 tablets (60 mg total) by mouth 2 (two) times daily. Patient taking differently: Take 60 mg by mouth 2 (two) times daily after a meal.  02/03/18  Yes Belva Crome, MD  vitamin B-12 (CYANOCOBALAMIN) 500 MCG tablet Take 500 mcg by mouth daily.  Yes [provider]  zinc gluconate 50 MG tablet Take 50 mg by mouth daily.   Yes [provider]  amiodarone (PACERONE) 200 MG tablet Take 1 tablet (200 mg total) by mouth 2 (two) times daily. 03/17/18   Isaiah Serge, NP    Past Medical History: Past Medical History:  Diagnosis Date  . Arthritis   . Atherosclerotic heart disease   . Back pain   . Bruises easily    takes Pletal daily and ASA 325mg  daily  . Cataract immature    right  . Chest pain   . Colon polyps   . Complication of anesthesia    hard to wake up with bypass surgery  . Cyst    on perineum;takes Doxycycline daily  . Diabetes mellitus    takes Glipizide,Metformin,and Actos daily  . Diarrhea   . Dyspnea    when lying flat  . Edema    right leg knee down swollen since fall 3 wks ago  . Foot drop, left   . GERD (gastroesophageal reflux disease)    Rolaids as needed-occ reflux  . Hemorrhoids   . History of kidney stones    at age 28   . Hyperlipidemia    takes Tricor and Lipitor daily  . Hypertension    takes Altace,Amlodipine,and Nadolol daily  . Hypothyroidism   . Kidney stone    35+yrs ago  . Muscle pain   . Nasal polyps    hx of  . Peripheral edema    wears knee length hose-told by  podiatrist to wear these  . Peripheral neuropathy    takes Gabapentin daily  . Pneumonia    as a child  . PVD (peripheral vascular disease) ([place])   . Renal insufficiency   . Seasonal allergies    takes Mucinex and Zyrtec prn  . Urinary frequency    urgency/takes Vesicare daily    Past Surgical History: Past Surgical History:  Procedure Laterality Date  . Aortobifemoral bypass    . APPLICATION OF WOUND VAC Left 10/25/2017   Procedure: Wound vac placement to left groin.;  Surgeon: Elam Dutch, MD;  Location: [facility] OR;  Service: Vascular;  Laterality: Left;  . APPLICATION OF WOUND VAC Left 11/10/2017   Procedure: APPLICATION OF WOUND VAC;  Surgeon: Elam Dutch, MD;  Location: [place];  Service: Vascular;  Laterality: Left;  . CARDIAC CATHETERIZATION  most recent 05/2011   total of 4  . CARDIOVERSION N/A 03/22/2018   Procedure: CARDIOVERSION;  Surgeon: Jerline Pain, MD;  Location: [facility] ENDOSCOPY;  Service: Cardiovascular;  Laterality: N/A;  . CAROTID ANGIOGRAM N/A 06/05/2011   Procedure: CAROTID Cyril Loosen;  Surgeon: Elam Dutch, MD;  Location: [name] CATH LAB;  Service: Cardiovascular;  Laterality: N/A;  . CAROTID ENDARTERECTOMY  04/08/2011   left CEA  . cataract surgery Bilateral    left  . COLONOSCOPY    . COLONOSCOPY WITH PROPOFOL N/A 08/22/2013   Procedure: COLONOSCOPY WITH PROPOFOL;  Surgeon: Garlan Fair, MD;  Location: [facility] ENDOSCOPY;  Service: Endoscopy;  Laterality: N/A;  . CORONARY ARTERY BYPASS GRAFT  09/09/2011   Procedure: CORONARY ARTERY BYPASS GRAFTING (CABG);  Surgeon: Gaye Pollack, MD;  Location: [place];  Service: Open Heart Surgery;  Laterality: N/A;  Coronary artery bypass grafting  x three with Right saphenous vein harvested endoscopically and left internal mammary artery  . ENDARTERECTOMY  04/08/2011   Procedure: ENDARTERECTOMY CAROTID;  Surgeon: Elam Dutch, MD;  Location: [facility]  OR;  Service: Vascular;  Laterality: Left;  with patch angioplasty  .  ENDARTERECTOMY  09/09/2011   Procedure: ENDARTERECTOMY CAROTID;  Surgeon: Elam Dutch, MD;  Location: [facility] OR;  Service: Vascular;  Laterality: Right;  with patch angioplasty  . ESOPHAGOGASTRODUODENOSCOPY (EGD) WITH PROPOFOL N/A 03/10/2018   Procedure: ESOPHAGOGASTRODUODENOSCOPY (EGD) WITH PROPOFOL;  Surgeon: Arta Silence, MD;  Location: [place];  Service: Endoscopy;  Laterality: N/A;  . FEMORAL-POPLITEAL BYPASS GRAFT Left 10/25/2017   Procedure: Left Common Femoral Endarterectomy and perfundaplasty, Left BYPASS GRAFT FEMORAL-BELOW KNEE POPLITEAL ARTERY.;  Surgeon: Elam Dutch, MD;  Location: [place];  Service: Vascular;  Laterality: Left;  . GROIN DEBRIDEMENT Left 11/10/2017   Procedure: GROIN DEBRIDEMENT;  Surgeon: Elam Dutch, MD;  Location: [place];  Service: Vascular;  Laterality: Left;  . LEFT HEART CATHETERIZATION WITH CORONARY ANGIOGRAM N/A 07/09/2011   Procedure: LEFT HEART CATHETERIZATION WITH CORONARY ANGIOGRAM;  Surgeon: Sinclair Grooms, MD;  Location: [facility] CATH LAB;  Service: Cardiovascular;  Laterality: N/A;  . LOWER EXTREMITY ANGIOGRAPHY N/A 10/11/2017   Procedure: LOWER EXTREMITY ANGIOGRAPHY;  Surgeon: Waynetta Sandy, MD;  Location: [place] CV LAB;  Service: Cardiovascular;  Laterality: N/A;  . Reimplantation of inferior mesenteric artery    . Repair of infrarenal abdominal aortic aneurysm    . SHOULDER ARTHROSCOPY W/ ROTATOR CUFF REPAIR     right and left-open procedures  . TEE WITHOUT CARDIOVERSION N/A 03/22/2018   Procedure: TRANSESOPHAGEAL ECHOCARDIOGRAM (TEE);  Surgeon: Jerline Pain, MD;  Location: [facility] ENDOSCOPY;  Service: Cardiovascular;  Laterality: N/A;  . TONSILLECTOMY     at age 13  . TRIGGER FINGER RELEASE Right 06/28/2014   Procedure: RIGHT LONG TRIGGER RELEASE ;  Surgeon: Leanora Cover, MD;  Location: [place];  Service: Orthopedics;  Laterality: Right;  . wisdom teeth extracted     as a teenager  . WOUND DEBRIDEMENT Left  11/05/2017   Procedure: DEBRIDEMENT WOUND LEFT GROIN with wound vac placement;  Surgeon: Elam Dutch, MD;  Location: [facility] OR;  Service: Vascular;  Laterality: Left;    Family History: Family History  Problem Relation Age of Onset  . Heart disease Mother   . Heart disease Father   . Anesthesia problems Neg Hx   . Hypotension Neg Hx   . Malignant hyperthermia Neg Hx   . Pseudochol deficiency Neg Hx     Social History: Social History   Socioeconomic History  . Marital status: Married    Spouse name: Not on file  . Number of children: 2  . Years of education: Not on file  . Highest education level: Professional school degree (e.g., MD, DDS, DVM, JD)  Occupational History  . Occupation: retired Chief Executive Officer  Social Needs  . Financial resource strain: Not on file  . Food insecurity:    Worry: Not on file    Inability: Not on file  . Transportation needs:    Medical: Not on file    Non-medical: Not on file  Tobacco Use  . Smoking status: Former Smoker    Years: 40.00    Types: Cigarettes    Last attempt to quit: 10/27/2005    Years since quitting: 12.4  . Smokeless tobacco: Never Used  Substance and Sexual Activity  . Alcohol use: Yes    Alcohol/week: 2.0 - 3.0 standard drinks    Types: 2 - 3 Cans of beer per week    Comment: occasional- weekly  . Drug use: No  .  Sexual activity: Never  Lifestyle  . Physical activity:    Days per week: Not on file    Minutes per session: Not on file  . Stress: Not on file  Relationships  . Social connections:    Talks on phone: Patient refused    Gets together: Patient refused    Attends religious service: Patient refused    Active member of club or organization: Patient refused    Attends meetings of clubs or organizations: Patient refused    Relationship status: Patient refused  Other Topics Concern  . Not on file  Social History Narrative   He lives with wife in a 1 story home, three steps to entire home.  2 children.  Retired  Chief Executive Officer.  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      Allergies:  Allergies  Allergen Reactions  . Amoxicillin-Pot Clavulanate Nausea Only    Has patient had a PCN reaction causing immediate rash, facial/tongue/throat swelling, SOB or lightheadedness with hypotension: No Has patient had a PCN reaction causing severe rash involving mucus membranes or skin necrosis: No Has patient had a PCN reaction that required hospitalization: No Has patient had a PCN reaction occurring within the last 10 years: No If all of the above answers are "NO", then may proceed with Cephalosporin use.   Marland Kitchen Morphine And Related Other (See Comments)    hallucinations  . Percocet [Oxycodone-Acetaminophen] Other (See Comments)    Agitation and Increased Confusion. Family asks to please not order this medication for him.   . Restoril [Temazepam] Other (See Comments)    Confusion and Agitation. Has taken in past, but has not reacted well. Family requests to not give this mediation.     Objective:    Vital Signs:   Temp:  [97.4 F (36.3 C)-98.8 F (37.1 C)] 98.8 F (37.1 C) (10/30 1144) Pulse Rate:  [58] 58 (10/29 1624) BP: (90-123)/(54-80) 111/69 (10/30 1144) SpO2:  [96 %-100 %] 100 % (10/30 1144) Weight:  [115.5 kg] 115.5 kg (10/30 0528) Last BM Date: 03/23/18  Weight change: Filed Weights   03/21/18 0320 03/22/18 0300 03/23/18 0528  Weight: 115.5 kg 117.3 kg 115.5 kg    Intake/Output:   Intake/Output Summary (Last 24 hours) at 03/23/2018 1517 Last data filed at 03/23/2018 1147 Gross per 24 hour  Intake 630 ml  Output 300 ml  Net 330 ml      Physical Exam    General:  Appears chronically ill.  No resp difficulty HEENT: jaundice Neck: supple. JVP to jaw  . Carotids 2+ bilat; no bruits. No lymphadenopathy or thyromegaly appreciated. Cor: PMI nondisplaced. Regular rate & rhythm. No rubs, gallops or murmurs. Lungs: clear Abdomen: soft, nontender, nondistended. No hepatosplenomegaly. No bruits or masses. Good  bowel sounds. Extremities: no cyanosis, clubbing, rash, R and LLE 1-2+ edema. edema Neuro: alert & orientedx self , cranial nerves grossly intact. moves all 4 extremities   Telemetry   Sinus Brady 50s   EKG    n/a  Labs   Basic Metabolic Panel: Recent Labs  Lab 03/17/18 1627  03/18/18 1325  03/19/18 0540 03/20/18 0537 03/21/18 0509 03/22/18 0320 03/23/18 0417  NA 132*   < > 130*  --  131* 132* 131* 126* 126*  K 3.2*  --  2.4*   < > 2.6* 3.2* 4.2 4.7 4.7  CL 89*  --  92*  --  89* 88* 92* 87* 89*  CO2 26  --  24  --  28 29 27 25  25  GLUCOSE 267*   < > 224*  --  213* 173* 193* 211* 171*  BUN 67*   < > 75*  --  73* 88* 97* 106* 120*  CREATININE 2.40*  --  2.51*  --  2.31* 2.72* 2.84* 2.92* 3.33*  CALCIUM 8.7  --  8.2*  --  8.4* 8.6* 8.3* 8.4* 8.2*  MG 2.0  --  1.9  --   --  2.3  --   --   --    < > = values in this interval not displayed.    Liver Function Tests: Recent Labs  Lab 03/17/18 1627 03/18/18 1325 03/20/18 0537 03/21/18 0509 03/23/18 0417  AST 20 26 49* 50* 394*  ALT 11 13 22 29  139*  ALKPHOS 40 32* 40 39 41  BILITOT 0.7 1.3* 1.2 1.1 1.0  PROT 6.3 5.6* 5.7* 5.8* 5.5*  ALBUMIN 3.4* 2.5* 2.2* 2.2* 1.9*   Recent Labs  Lab 03/20/18 0537  LIPASE 29   Recent Labs  Lab 03/21/18 0509  AMMONIA 25    CBC: Recent Labs  Lab 03/18/18 1325 03/19/18 0540 03/20/18 0537 03/21/18 0509 03/22/18 0320 03/23/18 0417  WBC 8.6 9.2 9.4 9.2 10.8* 10.0  NEUTROABS 7.5  --   --   --   --   --   HGB 8.8* 8.9* 9.4* 9.5* 10.0* 10.0*  HCT 27.7* 27.9* 30.4* 30.9* 31.1* 31.1*  MCV 88.8 87.2 88.6 88.3 86.9 85.9  PLT 235 236 253 257 281 316    Cardiac Enzymes: Recent Labs  Lab 03/18/18 1325  TROPONINI 0.03*    BNP: BNP (last 3 results) Recent Labs    02/25/18 1340 03/18/18 1325  BNP 1,809.7* 2,440.0*    ProBNP (last 3 results) Recent Labs    02/03/18 1217 02/24/18 1243  PROBNP 10,666* 17,251*     CBG: Recent Labs  Lab 03/22/18 1419  03/22/18 1643 03/22/18 2109 03/23/18 0758 03/23/18 1142  GLUCAP 240* 218* 212* 150* 140*    Coagulation Studies: No results for input(s): LABPROT, INR in the last 72 hours.   Imaging    No results found.   Medications:     Current Medications: . amiodarone  200 mg Oral BID  . apixaban  5 mg Oral BID  . atorvastatin  20 mg Oral QPC supper  . carvedilol  3.125 mg Oral BID  . fenofibrate  54 mg Oral Daily  . gabapentin  100 mg Oral BID PC  . insulin aspart  0-20 Units Subcutaneous TID WC  . insulin aspart  0-5 Units Subcutaneous QHS  . insulin glargine  8 Units Subcutaneous QHS  . levothyroxine  100 mcg Oral QAC breakfast  . pantoprazole  40 mg Oral Daily  . tamsulosin  0.4 mg Oral Q M,W,F     Infusions: . cefTRIAXone (ROCEPHIN)  IV 2 g (03/22/18 2037)  . furosemide 120 mg (03/22/18 2119)       Patient Profile   Mr Toledo is a 77 year old with a history of chronic combined systolic/diastolic heart failure, PAF, GI bleed, PAD, memory impairment, CAD, CABG, hypterlipidemia, CKD, DMII.   Admitted with increased fatigue and dyspnea.    Assessment/Plan   1. A/C Systolic Heart Failure,  TEE this admit with EF down to 30%. Poor diuresis with high dose lasix.  No ace/arb with AKI  2. A fib RVR S/P TEE/DC-CV Maintaining NSR On amiodarone 200 mg twice a day We will need to hold eliquis for possible HD catheter. If  he does not get HD catheter will need to resume eliquis.   3. AKI on CKD Stage III Renal function continues to decline. BUN up to 120  Nephrology consult pending.  Appears uremic  4. ID + Blood CX ---> klebsiella pneumoniae On ceftriaxone.   5. Hyponatremia  Sodium down to 126.   6. Elevated LFTs  7. CAD  CABG in past.  8. PAD  S/P Aorto Bifem BPG 09/2017  Will need dialysis versus hospice.     Medication concerns reviewed with patient and pharmacy team. Barriers identified: na.   Length of Stay: Powell, NP  03/23/2018,  3:17 PM  Advanced Heart Failure Team Pager [phone] (M-F; [place])  Please contact [facility] for night-coverage after hours (4p -7a ) and weekends on [email or web address]  Patient seen with NP, agree with the above note.   Patient was admitted with marked volume overload for diuresis.  He was in atrial fibrillation.  So far this admission, he has been treated with Lasix 120 mg IV every 8 hrs without much urine output.  Creatinine has been rising, BUN up to 120 with creatinine 3.33 today.  He had TEE-guided DCCV yesterday.  EF was 30-35% with moderately decreased RV systolic function on TEE. Currently, he is drowsy but will awaken.  He is somewhat confused when awake.  Also of note, he has Klebsiella bacteremia.   On exam, JVP 14 cm with 2+ edema into the thighs.  Regular S1S2 with 2/6 SEM RUSB.  Decreased BS at bases.  1. Acute on chronic systolic CHF: Ischemic cardiomyopathy, TEE this admission with EF 30-35%, moderately decreased systolic function.  On exam, he is markedly volume overloaded.  Poor diuresis this admission with Lasix 120 mg IV every 8 hrs and rising BUN/creatinine.  At this point, I do not think that diuretics are going to be effective here.  I think we may also be beyond the point where he would get appreciable improvement from inotropes.  He is not a candidate for advanced therapies with age and renal dysfunction.  - I will try Lasix 160 mg IV bid, give dose now.  - Will need to make decision soon on dialysis.  He may tolerate dialysis temporarily as BP has been stable.  However, with his comorbidities, I am concerned that long-term HD would be very difficult.  He currently appears uremic and markedly volume overloaded, so barring a surprising response to IV Lasix bolus, will need to decide on HD versus comfort care/hospice soon.  Renal to see him this evening.  2. AKI on CKD stage 3-4: Steady rise in BUN/creatinine since admission. Baseline appears 1.8-2.  Minimal UOP today.  Appears  uremic.  See discussion above regarding dialysis versus comfort care.  Renal to see today.  3. Atrial fibrillation with RVR: Now in NSR s/p TEE-guided DCCV.  He is on amiodarone to maintain NSR and Eliquis.  - Hold Eliquis for now pending decision on HD, will need catheter placement if decide for HD.  Will need to restart Eliquis afterwards given DCCV this admission.  4. CAD: S/p CABG in 2013.  No chest pain. He is on atorvastatin.  5. Altered mental status/delirium: Suspect uremia.  6. ID: Klebsiella in blood cultures. ?urine or lung source.  CXR did not show definite PNA.  He is on ceftriaxone, per primary team. 7. Hyponatremia: Na 126.  8. PAD: Extensive history.  Has had aorto-bifemoral bypass, left common femoral endarterectomy with fem-pop bypass.  Loralie Champagne 03/23/2018 4:47 PM

## 2018-03-23 NOTE — Evaluation (Signed)
Physical Therapy Evaluation Patient Details Name: [name] MRN: [id number] DOB: [birth year] Today's Date: 03/23/2018   History of Present Illness  77 year old man was admitted for acute CHF exacerbation.  He was recently admitted with GIB.  PMH:  fem/pop 6/19, cellulitis, CAD, s/p CABG, chronic diastolic heart failure, PAD, HTN, CKD  Clinical Impression  Pt admitted with above diagnosis. Pt currently with functional limitations due to the deficits listed below (see PT Problem List). Pt was able to sit EOB with min to min guard assist for 20 min.  Needed Mod assist to come to EOB.  Pt lethargy limiting pts ability to maintain conversation and following commands.  Will follow acutely. Will probably need SNF to regain strength prior to d/c home.   Pt will benefit from skilled PT to increase their independence and safety with mobility to allow discharge to the venue listed below.      Follow Up Recommendations SNF;Supervision/Assistance - 24 hour    Equipment Recommendations  Other (comment)(TBA)    Recommendations for Other Services       Precautions / Restrictions Precautions Precautions: Fall Required Braces or Orthoses: (per notes L AFO) Restrictions Weight Bearing Restrictions: No      Mobility  Bed Mobility   Bed Mobility: Rolling;Sidelying to Sit Rolling: +2 for physical assistance;Mod assist Sidelying to sit: Mod assist       General bed mobility comments: Pt needed mod assist to roll to right and mod assist to bring LEs off bed and for elevation of trunk  Transfers                 General transfer comment: unable to stand  Ambulation/Gait                Stairs            Wheelchair Mobility    Modified Rankin (Stroke Patients Only)       Balance Overall balance assessment: Needs assistance Sitting-balance support: Bilateral upper extremity supported;Feet supported Sitting balance-Leahy Scale: Poor Sitting balance - Comments:  Progressed to sitting without UE support with min guard assist but at times needing min assist for balance.  Sat EOB 20 min performing reaching and LE exercises.                                      Pertinent Vitals/Pain Pain Assessment: Faces Faces Pain Scale: Hurts even more Pain Location: arms and legs with movement Pain Descriptors / Indicators: Tightness;Discomfort;Tender    Home Living Family/patient expects to be discharged to:: Unsure Living Arrangements: Spouse/significant other Available Help at Discharge: Family;Available 24 hours/day Type of Home: House Home Access: Ramped entrance Entrance Stairs-Rails: Left Entrance Stairs-Number of Steps: 3 Home Layout: One level Home Equipment: Clinical cytogeneticist - 2 wheels;Grab bars - tub/shower      Prior Function Level of Independence: Needs assistance   Gait / Transfers Assistance Needed: RW for functional mobility  ADL's / Homemaking Assistance Needed: wife assisted with adls  Comments: returning back home from SNF in august     Hand Dominance   Dominant Hand: Right    Extremity/Trunk Assessment   Upper Extremity Assessment Upper Extremity Assessment: Defer to OT evaluation    Lower Extremity Assessment Lower Extremity Assessment: RLE deficits/detail;LLE deficits/detail RLE Deficits / Details: grossly 2+/5 LLE Deficits / Details: grossly 3-/5    Cervical / Trunk Assessment Cervical / Trunk  Assessment: Kyphotic  Communication   Communication: No difficulties  Cognition Arousal/Alertness: Suspect due to medications Behavior During Therapy: WFL for tasks assessed/performed Overall Cognitive Status: Within Functional Limits for tasks assessed                                 General Comments: followed commands with incr time; would doze off and would have to repeat questions multiple times;  oriented to place      General Comments General comments (skin integrity, edema, etc.):  VSS    Exercises General Exercises - Lower Extremity Ankle Circles/Pumps: AROM;Both;5 reps;Supine Long Arc Quad: AROM;Both;5 reps;Seated Heel Slides: AROM;Both;5 reps;Supine   Assessment/Plan    PT Assessment Patient needs continued PT services  PT Problem List Decreased strength;Decreased range of motion;Decreased activity tolerance;Decreased balance;Decreased mobility;Pain       PT Treatment Interventions DME instruction;Gait training;Stair training;Functional mobility training;Therapeutic activities;Therapeutic exercise;Balance training;Patient/family education    PT Goals (Current goals can be found in the Care Plan section)  Acute Rehab PT Goals Patient Stated Goal: get stronger PT Goal Formulation: Patient unable to participate in goal setting Time For Goal Achievement: 04/06/18 Potential to Achieve Goals: Good    Frequency Min 3X/week   Barriers to discharge        Co-evaluation               AM-PAC PT "6 Clicks" Daily Activity  Outcome Measure Difficulty turning over in bed (including adjusting bedclothes, sheets and blankets)?: Unable Difficulty moving from lying on back to sitting on the side of the bed? : Unable Difficulty sitting down on and standing up from a chair with arms (e.g., wheelchair, bedside commode, etc,.)?: Unable Help needed moving to and from a bed to chair (including a wheelchair)?: Total Help needed walking in hospital room?: Total Help needed climbing 3-5 steps with a railing? : Total 6 Click Score: 6    End of Session Equipment Utilized During Treatment: Gait belt;Oxygen Activity Tolerance: Patient limited by fatigue;Patient limited by lethargy Patient left: with call bell/phone within reach;in bed;with bed alarm set Nurse Communication: Mobility status;Need for lift equipment PT Visit Diagnosis: Unsteadiness on feet (R26.81);Other abnormalities of gait and mobility (R26.89);Muscle weakness (generalized) (M62.81);Repeated falls  (R29.6);Difficulty in walking, not elsewhere classified (R26.2)    Time: 0569-7948 PT Time Calculation (min) (ACUTE ONLY): 41 min   Charges:   PT Evaluation $PT Eval Moderate Complexity: 1 Mod PT Treatments $Therapeutic Exercise: 8-22 mins $Therapeutic Activity: 8-22 mins        Eligah Anello,PT Acute Rehabilitation Services Pager:  [phone]  Office:  [phone]    Denice Paradise 03/23/2018, 11:51 AM

## 2018-03-23 NOTE — Progress Notes (Signed)
Progress Note  Patient Name: [name] Date of Encounter: 03/23/2018  Primary Cardiologist: Sinclair Grooms, MD   Subjective   Patient very tired and had difficulty staying awake during exam. He still notes some shortness of breath and difficulty breathing when lying flat but denies any chest pain.  Inpatient Medications    Scheduled Meds: . amiodarone  200 mg Oral BID  . apixaban  5 mg Oral BID  . atorvastatin  20 mg Oral QPC supper  . carvedilol  3.125 mg Oral BID  . fenofibrate  54 mg Oral Daily  . gabapentin  100 mg Oral BID PC  . insulin aspart  0-20 Units Subcutaneous TID WC  . insulin aspart  0-5 Units Subcutaneous QHS  . insulin glargine  8 Units Subcutaneous QHS  . levothyroxine  100 mcg Oral QAC breakfast  . pantoprazole  40 mg Oral Daily  . tamsulosin  0.4 mg Oral Q M,W,F   Continuous Infusions: . cefTRIAXone (ROCEPHIN)  IV 2 g (03/22/18 2037)  . furosemide 120 mg (03/22/18 2119)   PRN Meds: acetaminophen, acetaminophen, nitroGLYCERIN, ondansetron (ZOFRAN) IV   Vital Signs    Vitals:   03/22/18 1800 03/22/18 1950 03/23/18 0009 03/23/18 0528  BP: 100/60 (!) 101/57 (!) 90/54 123/80  Pulse:      Resp:      Temp:  (!) 97.4 F (36.3 C)  97.9 F (36.6 C)  TempSrc:  Oral  Oral  SpO2:  100%  100%  Weight:    115.5 kg    Intake/Output Summary (Last 24 hours) at 03/23/2018 0844 Last data filed at 03/23/2018 0525 Gross per 24 hour  Intake 1442.68 ml  Output -  Net 1442.68 ml   Filed Weights   03/21/18 0320 03/22/18 0300 03/23/18 0528  Weight: 115.5 kg 117.3 kg 115.5 kg    Telemetry    Sinus bradycardia with heart rate in the 50s bpm - Personally Reviewed  Physical Exam   GEN: 77 year old obese Caucasian male resting comfortably in no acute distress. Currently on supplemental O2 via nasal cannula. Neck: Supple. Cardiac: Bradycardic with regular rhythm. No murmurs, rubs, or gallops.  Respiratory: Diminished breath sounds but clear to  auscultation anteriorly (patient was too tired to sit up for me to get a good listen to bases). GI: Soft, non-tender, non-distended. MS: 1+ pitting edema of bilateral lower extremities. Currently wearing heel protector boots. Neuro: No focal deficits. Psych: Very drowsy.  Labs    Chemistry Recent Labs  Lab 03/20/18 0537 03/21/18 0509 03/22/18 0320 03/23/18 0417  NA 132* 131* 126* 126*  K 3.2* 4.2 4.7 4.7  CL 88* 92* 87* 89*  CO2 29 27 25 25   GLUCOSE 173* 193* 211* 171*  BUN 88* 97* 106* 120*  CREATININE 2.72* 2.84* 2.92* 3.33*  CALCIUM 8.6* 8.3* 8.4* 8.2*  PROT 5.7* 5.8*  --  5.5*  ALBUMIN 2.2* 2.2*  --  1.9*  AST 49* 50*  --  394*  ALT 22 29  --  139*  ALKPHOS 40 39  --  41  BILITOT 1.2 1.1  --  1.0  GFRNONAA 21* 20* 19* 16*  GFRAA 24* 23* 22* 19*  ANIONGAP 15 12 14 12      Hematology Recent Labs  Lab 03/21/18 0509 03/22/18 0320 03/23/18 0417  WBC 9.2 10.8* 10.0  RBC 3.50* 3.58* 3.62*  HGB 9.5* 10.0* 10.0*  HCT 30.9* 31.1* 31.1*  MCV 88.3 86.9 85.9  MCH 27.1 27.9  27.6  MCHC 30.7 32.2 32.2  RDW 16.5* 16.2* 16.2*  PLT 257 281 316    Cardiac Enzymes Recent Labs  Lab 03/18/18 1325  TROPONINI 0.03*   No results for input(s): TROPIPOC in the last 168 hours.   BNP Recent Labs  Lab 03/18/18 1325  BNP 2,440.0*     DDimer No results for input(s): DDIMER in the last 168 hours.   Radiology    No results found.  Cardiac Studies   TEE/DCCV 03/22/2018: Study Conclusions: - Left ventricle: The cavity size was dilated. Wall thickness was   normal. Systolic function was moderately to severely reduced. The   estimated ejection fraction was in the range of 30% to 35%.   Diffuse hypokinesis. No evidence of thrombus. - Aortic valve: No evidence of vegetation. - Mitral valve: Mildly calcified annulus. Mildly thickened leaflets   . No evidence of vegetation. There was mild regurgitation. - Left atrium: The atrium was dilated. No evidence of thrombus in   the  atrial cavity or appendage. No evidence of thrombus in the   atrial cavity or appendage. No evidence of thrombus in the   appendage. - Right ventricle: The cavity size was dilated. Systolic function   was moderately reduced. - Right atrium: The atrium was dilated. No evidence of thrombus in   the atrial cavity or appendage. - Atrial septum: No defect or patent foramen ovale was identified.   Echo contrast study showed no right-to-left atrial level shunt,   following an increase in RA pressure induced by provocative   maneuvers. - Tricuspid valve: No evidence of vegetation. - Pulmonic valve: No evidence of vegetation. - Superior vena cava: The study excluded a thrombus.  Impressions: - Successful cardioversion. No cardiac source of emboli was   indentified. _______________  Echocardiogram 02/09/2018: Study Conclusions: - Left ventricle: The cavity size was normal. There was moderate   concentric hypertrophy. Systolic function was mildly to   moderately reduced. The estimated ejection fraction was in the   range of 40% to 45%. Anterior hypokinesis. Grade 2 DD with high   LV filling pressure. - Aortic valve: Calcified without stenosis. Mean gradient (S): 6 mm   Hg. - Left atrium: Moderately dilated. - Right atrium: The atrium was mildly dilated. - Inferior vena cava: The vessel was normal in size. The   respirophasic diameter changes were in the normal range (>= 50%),   consistent with normal central venous pressure.  Impressions: - Technically difficult study. LVEF 40-45%, more significant   anterior hypokinesis, grade 2 DD, high LV filling pressure,   moderate LAE, mild RAE.  Patient Profile     77 y.o. male 77 y.o.malewith a history of chronic combined systolic and diastolic heart failure, recently diagnosed atrial flutter, recent GI bleeding, severe peripheral arterial disease, coronary artery disease is admitted with worsening weakness and leg swelling.  Assessment &  Plan    1. Atrial Flutter with RVR - Initial EKG on 03/17/2018 showed atrial flutter with RVR.  - TEE yesterday showed no evidence of thrombus in left atrial appendage. Patient underwent successful cardioversion. - Patient currently in sinus bradycardia with heart rate in the 50s on telemetry. - Continue Amiodarone 200mg  twice daily. - Continue anticoagulation with Eliquis 5mg  twice daily.  2. Acute on Chronic Combined Heart Failure - BNP elevated at 2,440 at time of admission. - TEE yesterday showed LVEF of 30-35% with diffuse hypokinesis.  - Echo on 02/09/2018 showed LVEF of 40-45% with grade 2 diastolic dysfunction. -  Continue Coreg 3.125mg  twice daily.  - Continue to hold ACE/ARB due to renal function. - SCr 3.33 today, up from 2.92 yesterday. Will hold Lasix at this time until MD can see.  - Continue daily weights and strict I's&O's. - Continue monitoring renal function closely while patient is being diuresed   3. Acute on Chronic Kidney Disease Stage  - Baseline Scr earlier 1.8 to 2.0 earlier this month but has been trending up in the setting of aggressive diuresis.  - Scr 3.33 today, up from 2.92 yesterday.   For questions or updates, please contact [facility] Please consult [email or web address] for contact info under        Signed, Darreld Mclean, PA-C  03/23/2018, 8:44 AM

## 2018-03-23 NOTE — Evaluation (Signed)
Occupational Therapy Evaluation Patient Details Name: [name] MRN: [id number] DOB: April 23, 1941 Today's Date: 03/23/2018    History of Present Illness 77 year old man was admitted for acute CHF exacerbation.  He was recently admitted with GIB.  PMH:  fem/pop 6/19, cellulitis, CAD, s/p CABG, chronic diastolic heart failure, PAD, HTN, CKD   Clinical Impression   Pt was admitted for the above.  He was at home after recent hospitalization and needed increased assistance.  Pt profoundly weak during evaluation and needs max to total +2 assist for most adls. Will follow in acute setting with the goals stated below    Follow Up Recommendations  SNF    Equipment Recommendations  (defer to next venue)    Recommendations for Other Services       Precautions / Restrictions Precautions Precautions: Fall Required Braces or Orthoses: (per notes L AFO) Restrictions Weight Bearing Restrictions: No      Mobility Bed Mobility   Bed Mobility: Rolling Rolling: Total assist;+2 for physical assistance         General bed mobility comments: unable to roll with +1; pt 5%  Transfers                      Balance                                           ADL either performed or assessed with clinical judgement   ADL   Eating/Feeding: Moderate assistance   Grooming: Brushing hair;Maximal assistance(wash face set up)   Upper Body Bathing: Maximal assistance   Lower Body Bathing: Total assistance;+2 for physical assistance   Upper Body Dressing : Maximal assistance   Lower Body Dressing: Total assistance;+2 for physical assistance                 General ADL Comments: pt unable to roll in bed with +1 assist     Vision         Perception     Praxis      Pertinent Vitals/Pain Pain Assessment: Faces Faces Pain Scale: Hurts even more Pain Location: arms and legs with movement     Hand Dominance Right   Extremity/Trunk Assessment  Upper Extremity Assessment Upper Extremity Assessment: (profound weakness bil shoulders; jerky movements 2-/5; dista)           Communication Communication Communication: No difficulties   Cognition Arousal/Alertness: Suspect due to medications Behavior During Therapy: WFL for tasks assessed/performed                                   General Comments: followed commands; oriented to place   General Comments       Exercises Exercises: (10 reps AAROM to 90 bil shoulders)   Shoulder Instructions      Home Living Family/patient expects to be discharged to:: Unsure Living Arrangements: Spouse/significant other Available Help at Discharge: Family;Available 24 hours/day Type of Home: House Home Access: Ramped entrance Entrance Stairs-Number of Steps: 3 Entrance Stairs-Rails: Left Home Layout: One level     Bathroom Shower/Tub: Occupational psychologist: Handicapped height     Home Equipment: Clinical cytogeneticist - 2 wheels;Grab bars - tub/shower          Prior Functioning/Environment      ADL's /  Homemaking Assistance Needed: wife assisted with adls            OT Problem List: Decreased strength;Decreased range of motion;Decreased activity tolerance;Impaired balance (sitting and/or standing);Pain;Increased edema;Cardiopulmonary status limiting activity;Impaired UE functional use      OT Treatment/Interventions: Self-care/ADL training;Therapeutic exercise;Energy conservation;DME and/or AE instruction;Therapeutic activities;Patient/family education    OT Goals(Current goals can be found in the care plan section) Acute Rehab OT Goals Patient Stated Goal: get stronger OT Goal Formulation: With patient Time For Goal Achievement: 04/06/18 Potential to Achieve Goals: Fair ADL Goals Pt Will Perform Grooming: with min assist Pt Will Perform Upper Body Bathing: with mod assist Pt Will Perform Upper Body Dressing: with mod assist Additional ADL  Goal #1: pt will roll to bil sides with mod A for adls Additional ADL Goal #2: pt will sit EOB x 5 minutes in preparation for adls with min A  OT Frequency: Min 2X/week   Barriers to D/C:            Co-evaluation              AM-PAC PT "6 Clicks" Daily Activity     Outcome Measure Help from another person eating meals?: A Lot Help from another person taking care of personal grooming?: A Lot Help from another person toileting, which includes using toliet, bedpan, or urinal?: Total Help from another person bathing (including washing, rinsing, drying)?: A Lot Help from another person to put on and taking off regular upper body clothing?: A Lot Help from another person to put on and taking off regular lower body clothing?: A Lot 6 Click Score: 11   End of Session    Activity Tolerance: Patient limited by fatigue;Patient limited by lethargy Patient left: in bed;with call bell/phone within reach;with bed alarm set  OT Visit Diagnosis: Muscle weakness (generalized) (M62.81)                Time: 5320-2334 OT Time Calculation (min): 17 min Charges:  OT General Charges $OT Visit: 1 Visit OT Evaluation $OT Eval Moderate Complexity: 1 Mod  [name], OTR/L Acute Rehabilitation Services [phone] [facility] pager [phone] office 03/23/2018  [name] 03/23/2018, 9:51 AM

## 2018-03-23 NOTE — Consult Note (Signed)
Reason for Consult: Renal failure Referring Physician:  Dr. Marthenia Rolling  Chief Complaint:  Weakness and SOB  Assessment/Plan: 1. AKI on advanced CKD IIIB/IV with poor reserve and now in cardiorenal syndrome +/- superimposed ATN. I explained to the spouse and daughter who were bedside that I do offer dialysis if it's going to improve QOL or if it's a temporizing measure until a reversible cause can be adequately treated. I do not feel that [name] falls in this category with his extensive medical history including compromised systolic cardiac function with an EF of 30%, CASHD, PAD.  - Would check a bladder scan to ensure an easily reversible cause is not present. - I'm not sure he would even tolerate dialysis and he would be high risk for not regaining renal function and becoming dialysis dependent. He is certainly not a candidate for long term dialysis. - He is not a long term dialysis candidate and the family agrees that we should not use such aggressive measures. - Agree with a palliative consult which the family concurred with.  2. Hyponatremia - hypervolemic variety secondary to CHF. Unfortunately won't improve till cardiac function improves. 3. CHF - EF 30% unfortunately not responding to high dose Lasix with a turn for the worse as well. Over the past 24hrs even less UOP, bordering on oliguria. 4. Bacteremia - E.Coli + Kleb PNA - Cefepime -> Rocephin 5. CASHD 6. PAD    HPI: [name] is an 77 y.o. male systolic CHF, aflutter, recent GIB, CASHD s/p CABG, DM, HTN, PAD, CKDIIIB/IV recently discharged with a GIB admitted this time for weakness and dyspnea, decreased exertional capability. He was found to be in HF with a BNP of 2440, CXR c/w CHF and a BUN/Cr of 75/2.51. He was also found to be in afib RVR, started on high dose IV Lasix and CV on 10/29 with conversion to NSR. Most recent echo showed an EF of 30%. at cardiology clinic during f/u tx with amiodarone and metolazone. He also was  bacteremic with E. Coli and Klebsiella Pneumoniae tx w/ Cefepime and then changed to Rocephin. He has not responded well to the high dose Lasix and is actually ~895 mL positive during this hospitalization. UOP has especially decreased over the past 24hrs and the BUN/Cr  Have continued to rise.  ROS Pertinent items are noted in HPI.  Chemistry and CBC: Creat  Date/Time Value Ref Range Status  04/18/2012 09:49 AM 1.32 0.50 - 1.35 mg/dL Final   Creatinine, Ser  Date/Time Value Ref Range Status  03/23/2018 04:17 AM 3.33 (H) 0.61 - 1.24 mg/dL Final  03/22/2018 03:20 AM 2.92 (H) 0.61 - 1.24 mg/dL Final  03/21/2018 05:09 AM 2.84 (H) 0.61 - 1.24 mg/dL Final  03/20/2018 05:37 AM 2.72 (H) 0.61 - 1.24 mg/dL Final  03/19/2018 05:40 AM 2.31 (H) 0.61 - 1.24 mg/dL Final  03/18/2018 01:25 PM 2.51 (H) 0.61 - 1.24 mg/dL Final  03/17/2018 04:27 PM 2.40 (H) 0.76 - 1.27 mg/dL Final  03/11/2018 04:25 AM 1.85 (H) 0.61 - 1.24 mg/dL Final  03/10/2018 04:55 AM 1.88 (H) 0.61 - 1.24 mg/dL Final  03/09/2018 03:53 AM 1.83 (H) 0.61 - 1.24 mg/dL Final  03/08/2018 07:59 AM 1.96 (H) 0.61 - 1.24 mg/dL Final  03/07/2018 06:32 PM 2.13 (H) 0.61 - 1.24 mg/dL Final  03/03/2018 03:16 AM 2.06 (H) 0.61 - 1.24 mg/dL Final  03/02/2018 02:52 AM 1.91 (H) 0.61 - 1.24 mg/dL Final  03/01/2018 07:12 AM 1.94 (H) 0.61 - 1.24 mg/dL Final  02/28/2018 02:48 AM 2.01 (H) 0.61 - 1.24 mg/dL Final  02/27/2018 02:14 PM 2.11 (H) 0.61 - 1.24 mg/dL Final  02/27/2018 03:01 AM 2.26 (H) 0.61 - 1.24 mg/dL Final  02/26/2018 02:35 AM 2.25 (H) 0.61 - 1.24 mg/dL Final  02/25/2018 10:14 PM 2.33 (H) 0.61 - 1.24 mg/dL Final  02/25/2018 11:14 AM 2.43 (H) 0.61 - 1.24 mg/dL Final  02/24/2018 12:43 PM 2.37 (H) 0.76 - 1.27 mg/dL Final  02/16/2018 02:44 PM 2.24 (H) 0.76 - 1.27 mg/dL Final  02/07/2018 04:02 PM 1.84 (H) 0.76 - 1.27 mg/dL Final  02/03/2018 12:17 PM 1.95 (H) 0.76 - 1.27 mg/dL Final  12/02/2017 03:40 AM 1.59 (H) 0.61 - 1.24 mg/dL Final  11/24/2017  03:40 AM 1.38 (H) 0.61 - 1.24 mg/dL Final  11/18/2017 04:17 AM 1.34 (H) 0.61 - 1.24 mg/dL Final  11/17/2017 05:12 AM 1.39 (H) 0.61 - 1.24 mg/dL Final  11/16/2017 05:03 AM 1.30 (H) 0.61 - 1.24 mg/dL Final  11/15/2017 03:42 AM 1.26 (H) 0.61 - 1.24 mg/dL Final  11/14/2017 09:02 AM 1.33 (H) 0.61 - 1.24 mg/dL Final  11/13/2017 06:56 AM 1.55 (H) 0.61 - 1.24 mg/dL Final  11/12/2017 06:32 AM 1.59 (H) 0.61 - 1.24 mg/dL Final  11/11/2017 01:16 PM 1.67 (H) 0.61 - 1.24 mg/dL Final  11/10/2017 03:36 AM 1.48 (H) 0.61 - 1.24 mg/dL Final  11/09/2017 03:23 AM 1.61 (H) 0.61 - 1.24 mg/dL Final  11/06/2017 02:06 AM 1.49 (H) 0.61 - 1.24 mg/dL Final  11/04/2017 06:19 PM 1.39 (H) 0.61 - 1.24 mg/dL Final  10/30/2017 07:29 AM 1.48 (H) 0.61 - 1.24 mg/dL Final  10/29/2017 06:27 AM 1.82 (H) 0.61 - 1.24 mg/dL Final  10/28/2017 08:09 AM 2.27 (H) 0.61 - 1.24 mg/dL Final  10/27/2017 03:45 PM 2.37 (H) 0.61 - 1.24 mg/dL Final  10/26/2017 05:22 AM 1.76 (H) 0.61 - 1.24 mg/dL Final  10/25/2017 04:57 PM 1.68 (H) 0.61 - 1.24 mg/dL Final  10/21/2017 12:33 PM 2.16 (H) 0.61 - 1.24 mg/dL Final  10/11/2017 11:02 AM 1.50 (H) 0.61 - 1.24 mg/dL Final  08/11/2017 06:17 PM 2.10 (H) 0.61 - 1.24 mg/dL Final  06/28/2014 10:00 AM 1.30 0.50 - 1.35 mg/dL Final  09/28/2011 05:25 AM 0.82 0.50 - 1.35 mg/dL Final  09/22/2011 07:30 AM 0.71 0.50 - 1.35 mg/dL Final  09/21/2011 03:32 AM 0.83 0.50 - 1.35 mg/dL Final   Recent Labs  Lab 03/17/18 1627 03/18/18 1325 03/18/18 2158 03/19/18 0540 03/20/18 0537 03/21/18 0509 03/22/18 0320 03/23/18 0417  NA 132* 130*  --  131* 132* 131* 126* 126*  K 3.2* 2.4* 2.8* 2.6* 3.2* 4.2 4.7 4.7  CL 89* 92*  --  89* 88* 92* 87* 89*  CO2 26 24  --  _0 GLUCOSE 267* 224*  --  213* 173* 193* 211* 171*  BUN 67* 75*  --  73* 88* 97* 106* 120*  CREATININE 2.40* 2.51*  --  2.31* 2.72* 2.84* 2.92* 3.33*  CALCIUM 8.7 8.2*  --  8.4* 8.6* 8.3* 8.4* 8.2*   Recent Labs  Lab 03/18/18 1325   03/20/18 0537 03/21/18 0509 03/22/18 0320 03/23/18 0417  WBC 8.6   < > 9.4 9.2 10.8* 10.0  NEUTROABS 7.5  --   --   --   --   --   HGB 8.8*   < > 9.4* 9.5* 10.0* 10.0*  HCT 27.7*   < > 30.4* 30.9* 31.1* 31.1*  MCV 88.8   < > 88.6 88.3 86.9 85.9  PLT 235   < > 253 257 281 316   < > = values in this interval not displayed.   Liver Function Tests: Recent Labs  Lab 03/20/18 0537 03/21/18 0509 03/23/18 0417  AST 49* 50* 394*  ALT 22 29 139*  ALKPHOS 40 39 41  BILITOT 1.2 1.1 1.0  PROT 5.7* 5.8* 5.5*  ALBUMIN 2.2* 2.2* 1.9*   Recent Labs  Lab 03/20/18 0537  LIPASE 29   Recent Labs  Lab 03/21/18 0509  AMMONIA 25   Cardiac Enzymes: Recent Labs  Lab 03/18/18 1325  TROPONINI 0.03*   Iron Studies: No results for input(s): IRON, TIBC, TRANSFERRIN, FERRITIN in the last 72 hours. PT/INR: _0 (inr:5)  Xrays/Other Studies: ) Results for orders placed or performed during the hospital encounter of 03/18/18 (from the past 48 hour(s))  Glucose, capillary     Status: Abnormal   Collection Time: 03/21/18 10:05 PM  Result Value Ref Range   Glucose-Capillary 139 (H) 70 - 99 mg/dL  Basic metabolic panel     Status: Abnormal   Collection Time: 03/22/18  3:20 AM  Result Value Ref Range   Sodium 126 (L) 135 - 145 mmol/L   Potassium 4.7 3.5 - 5.1 mmol/L   Chloride 87 (L) 98 - 111 mmol/L   CO2 25 22 - 32 mmol/L   Glucose, Bld 211 (H) 70 - 99 mg/dL   BUN 106 (H) 8 - 23 mg/dL   Creatinine, Ser 2.92 (H) 0.61 - 1.24 mg/dL   Calcium 8.4 (L) 8.9 - 10.3 mg/dL   GFR calc non Af Amer 19 (L) >60 mL/min   GFR calc Af Amer 22 (L) >60 mL/min    Comment: (NOTE) The eGFR has been calculated using the CKD EPI equation. This calculation has not been validated in all clinical situations. eGFR's persistently <60 mL/min signify possible Chronic Kidney Disease.    Anion gap 14 5 - 15    Comment: Performed at [facility] [address], [place], [place] [address]  CBC     Status:  Abnormal   Collection Time: 03/22/18  3:20 AM  Result Value Ref Range   WBC 10.8 (H) 4.0 - 10.5 K/uL   RBC 3.58 (L) 4.22 - 5.81 MIL/uL   Hemoglobin 10.0 (L) 13.0 - 17.0 g/dL   HCT 31.1 (L) 39.0 - 52.0 %   MCV 86.9 80.0 - 100.0 fL   MCH 27.9 26.0 - 34.0 pg   MCHC 32.2 30.0 - 36.0 g/dL   RDW 16.2 (H) 11.5 - 15.5 %   Platelets 281 150 - 400 K/uL   nRBC 0.2 0.0 - 0.2 %    Comment: Performed at [facility], [place] [address], [place], [place] [address]  Glucose, capillary     Status: Abnormal   Collection Time: 03/22/18  9:19 AM  Result Value Ref Range   Glucose-Capillary 205 (H) 70 - 99 mg/dL  Glucose, capillary     Status: Abnormal   Collection Time: 03/22/18 11:31 AM  Result Value Ref Range   Glucose-Capillary 208 (H) 70 - 99 mg/dL  Glucose, capillary     Status: Abnormal   Collection Time: 03/22/18  2:19 PM  Result Value Ref Range   Glucose-Capillary 240 (H) 70 - 99 mg/dL  Glucose, capillary     Status: Abnormal   Collection Time: 03/22/18  4:43 PM  Result Value Ref Range   Glucose-Capillary 218 (H) 70 - 99 mg/dL  Glucose, capillary  Status: Abnormal   Collection Time: 03/22/18  9:09 PM  Result Value Ref Range   Glucose-Capillary 212 (H) 70 - 99 mg/dL  Comprehensive metabolic panel     Status: Abnormal   Collection Time: 03/23/18  4:17 AM  Result Value Ref Range   Sodium 126 (L) 135 - 145 mmol/L   Potassium 4.7 3.5 - 5.1 mmol/L   Chloride 89 (L) 98 - 111 mmol/L   CO2 25 22 - 32 mmol/L   Glucose, Bld 171 (H) 70 - 99 mg/dL   BUN 120 (H) 8 - 23 mg/dL   Creatinine, Ser 3.33 (H) 0.61 - 1.24 mg/dL   Calcium 8.2 (L) 8.9 - 10.3 mg/dL   Total Protein 5.5 (L) 6.5 - 8.1 g/dL   Albumin 1.9 (L) 3.5 - 5.0 g/dL   AST 394 (H) 15 - 41 U/L   ALT 139 (H) 0 - 44 U/L   Alkaline Phosphatase 41 38 - 126 U/L   Total Bilirubin 1.0 0.3 - 1.2 mg/dL   GFR calc non Af Amer 16 (L) >60 mL/min   GFR calc Af Amer 19 (L) >60 mL/min    Comment: (NOTE) The eGFR has been calculated using the  CKD EPI equation. This calculation has not been validated in all clinical situations. eGFR's persistently <60 mL/min signify possible Chronic Kidney Disease.    Anion gap 12 5 - 15    Comment: Performed at [facility] [address], [place], [place] [address]  CBC     Status: Abnormal   Collection Time: 03/23/18  4:17 AM  Result Value Ref Range   WBC 10.0 4.0 - 10.5 K/uL   RBC 3.62 (L) 4.22 - 5.81 MIL/uL   Hemoglobin 10.0 (L) 13.0 - 17.0 g/dL   HCT 31.1 (L) 39.0 - 52.0 %   MCV 85.9 80.0 - 100.0 fL   MCH 27.6 26.0 - 34.0 pg   MCHC 32.2 30.0 - 36.0 g/dL   RDW 16.2 (H) 11.5 - 15.5 %   Platelets 316 150 - 400 K/uL   nRBC 0.4 (H) 0.0 - 0.2 %    Comment: Performed at [facility] [address] [place] [address], [place], [place] [address]  Glucose, capillary     Status: Abnormal   Collection Time: 03/23/18  7:58 AM  Result Value Ref Range   Glucose-Capillary 150 (H) 70 - 99 mg/dL  Glucose, capillary     Status: Abnormal   Collection Time: 03/23/18 11:42 AM  Result Value Ref Range   Glucose-Capillary 140 (H) 70 - 99 mg/dL   No results found.  PMH:   Past Medical History:  Diagnosis Date  . Arthritis   . Atherosclerotic heart disease   . Back pain   . Bruises easily    takes Pletal daily and ASA 350m daily  . Cataract immature    right  . Chest pain   . Colon polyps   . Complication of anesthesia    hard to wake up with bypass surgery  . Cyst    on perineum;takes Doxycycline daily  . Diabetes mellitus    takes Glipizide,Metformin,and Actos daily  . Diarrhea   . Dyspnea    when lying flat  . Edema    right leg knee down swollen since fall 3 wks ago  . Foot drop, left   . GERD (gastroesophageal reflux disease)    Rolaids as needed-occ reflux  . Hemorrhoids   . History of kidney stones    at  age 32   . Hyperlipidemia    takes Tricor and Lipitor daily  . Hypertension    takes Altace,Amlodipine,and Nadolol daily  . Hypothyroidism   . Kidney stone    35+yrs ago   . Muscle pain   . Nasal polyps    hx of  . Peripheral edema    wears knee length hose-told by podiatrist to wear these  . Peripheral neuropathy    takes Gabapentin daily  . Pneumonia    as a child  . PVD (peripheral vascular disease) ([place])   . Renal insufficiency   . Seasonal allergies    takes Mucinex and Zyrtec prn  . Urinary frequency    urgency/takes Vesicare daily    PSH:   Past Surgical History:  Procedure Laterality Date  . Aortobifemoral bypass    . APPLICATION OF WOUND VAC Left 10/25/2017   Procedure: Wound vac placement to left groin.;  Surgeon: Elam Dutch, MD;  Location: [facility] OR;  Service: Vascular;  Laterality: Left;  . APPLICATION OF WOUND VAC Left 11/10/2017   Procedure: APPLICATION OF WOUND VAC;  Surgeon: Elam Dutch, MD;  Location: [place];  Service: Vascular;  Laterality: Left;  . CARDIAC CATHETERIZATION  most recent 05/2011   total of 4  . CARDIOVERSION N/A 03/22/2018   Procedure: CARDIOVERSION;  Surgeon: Jerline Pain, MD;  Location: [facility] ENDOSCOPY;  Service: Cardiovascular;  Laterality: N/A;  . CAROTID ANGIOGRAM N/A 06/05/2011   Procedure: CAROTID Cyril Loosen;  Surgeon: Elam Dutch, MD;  Location: [facility] CATH LAB;  Service: Cardiovascular;  Laterality: N/A;  . CAROTID ENDARTERECTOMY  04/08/2011   left CEA  . cataract surgery Bilateral    left  . COLONOSCOPY    . COLONOSCOPY WITH PROPOFOL N/A 08/22/2013   Procedure: COLONOSCOPY WITH PROPOFOL;  Surgeon: Garlan Fair, MD;  Location: [facility] ENDOSCOPY;  Service: Endoscopy;  Laterality: N/A;  . CORONARY ARTERY BYPASS GRAFT  09/09/2011   Procedure: CORONARY ARTERY BYPASS GRAFTING (CABG);  Surgeon: Gaye Pollack, MD;  Location: [place];  Service: Open Heart Surgery;  Laterality: N/A;  Coronary artery bypass grafting  x three with Right saphenous vein harvested endoscopically and left internal mammary artery  . ENDARTERECTOMY  04/08/2011   Procedure: ENDARTERECTOMY CAROTID;  Surgeon: Elam Dutch, MD;   Location: [facility] OR;  Service: Vascular;  Laterality: Left;  with patch angioplasty  . ENDARTERECTOMY  09/09/2011   Procedure: ENDARTERECTOMY CAROTID;  Surgeon: Elam Dutch, MD;  Location: [facility] OR;  Service: Vascular;  Laterality: Right;  with patch angioplasty  . ESOPHAGOGASTRODUODENOSCOPY (EGD) WITH PROPOFOL N/A 03/10/2018   Procedure: ESOPHAGOGASTRODUODENOSCOPY (EGD) WITH PROPOFOL;  Surgeon: Arta Silence, MD;  Location: [facility];  Service: Endoscopy;  Laterality: N/A;  . FEMORAL-POPLITEAL BYPASS GRAFT Left 10/25/2017   Procedure: Left Common Femoral Endarterectomy and perfundaplasty, Left BYPASS GRAFT FEMORAL-BELOW KNEE POPLITEAL ARTERY.;  Surgeon: Elam Dutch, MD;  Location: [place];  Service: Vascular;  Laterality: Left;  . GROIN DEBRIDEMENT Left 11/10/2017   Procedure: GROIN DEBRIDEMENT;  Surgeon: Elam Dutch, MD;  Location: [place];  Service: Vascular;  Laterality: Left;  . LEFT HEART CATHETERIZATION WITH CORONARY ANGIOGRAM N/A 07/09/2011   Procedure: LEFT HEART CATHETERIZATION WITH CORONARY ANGIOGRAM;  Surgeon: Sinclair Grooms, MD;  Location: [facility] CATH LAB;  Service: Cardiovascular;  Laterality: N/A;  . LOWER EXTREMITY ANGIOGRAPHY N/A 10/11/2017   Procedure: LOWER EXTREMITY ANGIOGRAPHY;  Surgeon: Waynetta Sandy, MD;  Location: [facility] CV LAB;  Service: Cardiovascular;  Laterality: N/A;  . Reimplantation of inferior mesenteric artery    . Repair of infrarenal abdominal aortic aneurysm    . SHOULDER ARTHROSCOPY W/ ROTATOR CUFF REPAIR     right and left-open procedures  . TEE WITHOUT CARDIOVERSION N/A 03/22/2018   Procedure: TRANSESOPHAGEAL ECHOCARDIOGRAM (TEE);  Surgeon: Jerline Pain, MD;  [facility];  Service: Cardiovascular;  Laterality: N/A;  . TONSILLECTOMY     at age 72  . TRIGGER FINGER RELEASE Right 06/28/2014   Procedure: RIGHT LONG TRIGGER RELEASE ;  Surgeon: Leanora Cover, MD;  Location: [place];  Service: Orthopedics;   Laterality: Right;  . wisdom teeth extracted     as a teenager  . WOUND DEBRIDEMENT Left 11/05/2017   Procedure: DEBRIDEMENT WOUND LEFT GROIN with wound vac placement;  Surgeon: Elam Dutch, MD;  Location: [place];  Service: Vascular;  Laterality: Left;    Allergies:  Allergies  Allergen Reactions  . Amoxicillin-Pot Clavulanate Nausea Only    Has patient had a PCN reaction causing immediate rash, facial/tongue/throat swelling, SOB or lightheadedness with hypotension: No Has patient had a PCN reaction causing severe rash involving mucus membranes or skin necrosis: No Has patient had a PCN reaction that required hospitalization: No Has patient had a PCN reaction occurring within the last 10 years: No If all of the above answers are "NO", then may proceed with Cephalosporin use.   Marland Kitchen Morphine And Related Other (See Comments)    hallucinations  . Percocet [Oxycodone-Acetaminophen] Other (See Comments)    Agitation and Increased Confusion. Family asks to please not order this medication for him.   . Restoril [Temazepam] Other (See Comments)    Confusion and Agitation. Has taken in past, but has not reacted well. Family requests to not give this mediation.     Medications:   Prior to Admission medications   Medication Sig Start Date End Date Taking? Authorizing Provider  acetaminophen (TYLENOL) 500 MG tablet Take 1,000 mg by mouth every 6 (six) hours as needed for headache (pain).   Yes [provider]  apixaban (ELIQUIS) 5 MG TABS tablet Take 1 tablet (5 mg total) by mouth 2 (two) times daily. Patient taking differently: Take 5 mg by mouth 2 (two) times daily after a meal.  03/03/18 04/02/18 Yes Arrien, Jimmy Picket, MD  Ascorbic Acid (VITAMIN C PO) Take 1 tablet by mouth 2 (two) times daily.   Yes [provider]  atorvastatin (LIPITOR) 20 MG tablet Take 20 mg by mouth daily after supper.    Yes [provider]  carvedilol (COREG) 25 MG tablet Take 1 tablet  (25 mg total) by mouth 2 (two) times daily. Patient taking differently: Take 25 mg by mouth 2 (two) times daily after a meal.  03/03/18 04/02/18 Yes Arrien, Jimmy Picket, MD  fenofibrate 54 MG tablet Take 1 tablet (54 mg total) by mouth daily. 03/03/18 04/02/18 Yes Arrien, Jimmy Picket, MD  gabapentin (NEURONTIN) 300 MG capsule Take 300 mg by mouth 2 (two) times daily after a meal.    Yes [provider]  levothyroxine (SYNTHROID, LEVOTHROID) 100 MCG tablet Take 100 mcg by mouth daily before breakfast.  01/07/18  Yes [provider]  methylcellulose (ARTIFICIAL TEARS) 1 % ophthalmic solution Place 1 drop into both eyes 2 (two) times daily as needed (dry eyes).   Yes [provider]  metolazone (ZAROXOLYN) 5 MG tablet Take 5 mg by mouth See admin instructions. Take one tablet (5 mg) by mouth  when directed to do so by cardiologist.   Yes [provider]  Multiple Vitamin (MULTIVITAMIN WITH MINERALS) TABS tablet Take 1 tablet by mouth daily. 12/03/17  Yes Rhyne, Samantha J, PA-C  nitroGLYCERIN (NITROSTAT) 0.4 MG SL tablet PLACE 1 TABLET UNDER TONGUE EVERY 5 MINUTES AS NEEDED FOR CHEST PAIN. Patient taking differently: Place 0.4 mg under the tongue every 5 (five) minutes as needed for chest pain.  09/17/15  Yes Belva Crome, MD  oxymetazoline (VICKS SINEX) 0.05 % nasal spray Place 1 spray into both nostrils 2 (two) times daily as needed for congestion.   Yes [provider]  pantoprazole (PROTONIX) 40 MG tablet Take 1 tablet (40 mg total) by mouth daily. 03/11/18 03/11/19 Yes Pokhrel, Laxman, MD  pioglitazone (ACTOS) 30 MG tablet Take 1 tablet (30 mg total) by mouth daily with lunch. Patient taking differently: Take 30 mg by mouth daily after supper.  12/03/17  Yes Rhyne, Samantha J, PA-C  potassium chloride SA (K-DUR,KLOR-CON) 20 MEQ tablet Take by mouth as directed. Patient taking differently: Take 20 mEq by mouth See admin instructions. Take one tablet (20  meq) by mouth when directed to do so by MD 03/18/18  Yes Isaiah Serge, NP  pseudoephedrine-guaifenesin (MUCINEX D) 60-600 MG per tablet Take 1 tablet by mouth every 12 (twelve) hours as needed for congestion.    Yes [provider]  sodium chloride (OCEAN) 0.65 % SOLN nasal spray Place 1 spray into both nostrils 2 (two) times daily as needed for congestion.   Yes [provider]  tamsulosin (FLOMAX) 0.4 MG CAPS capsule Take 0.4 mg by mouth every Monday, Wednesday, and Friday.    Yes [provider]  torsemide (DEMADEX) 20 MG tablet Take 3 tablets (60 mg total) by mouth 2 (two) times daily. Patient taking differently: Take 60 mg by mouth 2 (two) times daily after a meal.  02/03/18  Yes Belva Crome, MD  vitamin B-12 (CYANOCOBALAMIN) 500 MCG tablet Take 500 mcg by mouth daily.   Yes [provider]  zinc gluconate 50 MG tablet Take 50 mg by mouth daily.   Yes [provider]  amiodarone (PACERONE) 200 MG tablet Take 1 tablet (200 mg total) by mouth 2 (two) times daily. 03/17/18   Isaiah Serge, NP    Discontinued Meds:   Medications Discontinued During This Encounter  Medication Reason  . furosemide (LASIX) injection 40 mg   . acetaminophen (TYLENOL) tablet 650 mg Dose change  . potassium chloride SA (K-DUR,KLOR-CON) CR tablet 40 mEq   . acetaminophen (TYLENOL) tablet 1,000 mg   . furosemide (LASIX) 120 mg in dextrose 5 % 50 mL IVPB   . traMADol (ULTRAM) tablet 50 mg   . 0.9 %  sodium chloride infusion   . sodium chloride flush (NS) 0.9 % injection 3 mL   . sodium chloride flush (NS) 0.9 % injection 3 mL   . 0.9 %  sodium chloride infusion   . carvedilol (COREG) tablet 25 mg   . furosemide (LASIX) 120 mg in dextrose 5 % 50 mL IVPB   . gabapentin (NEURONTIN) capsule 300 mg   . ceFEPIme (MAXIPIME) 1 g in sodium chloride 0.9 % 100 mL IVPB   . apixaban (ELIQUIS) tablet 5 mg   . furosemide (LASIX) 120 mg in dextrose 5 % 50 mL IVPB     Social  History:  reports that he quit smoking about 12 years ago. His smoking use included cigarettes. He quit  after 40.00 years of use. He has never used smokeless tobacco. He reports that he drinks about 2.0 - 3.0 standard drinks of alcohol per week. He reports that he does not use drugs.  Family History:   Family History  Problem Relation Age of Onset  . Heart disease Mother   . Heart disease Father   . Anesthesia problems Neg Hx   . Hypotension Neg Hx   . Malignant hyperthermia Neg Hx   . Pseudochol deficiency Neg Hx     Blood pressure 114/66, pulse (!) 53, temperature 97.8 F (36.6 C), resp. rate 17, weight 115.5 kg, SpO2 100 %. General appearance: slowed mentation Head: Normocephalic, without obvious abnormality, atraumatic Eyes: negative Neck: JVD - 8 cm above sternal notch, no adenopathy, no carotid bruit, supple, symmetrical, trachea midline and thyroid not enlarged, symmetric, no tenderness/mass/nodules Back: symmetric, no curvature. ROM normal. No CVA tenderness. Resp: poor air movement + effort Chest wall: no tenderness Cardio: tachy, 3+ edema GI: soft, non-tender; bowel sounds normal; no masses,  no organomegaly Extremities: edema 2-3+ Pulses: 2+ and symmetric Skin: Skin color, texture, turgor normal. No rashes or lesions Lymph nodes: Cervical, supraclavicular, and axillary nodes normal. Neurologic: Mental status: alertness: lethargic       Hudsen Fei, Hunt Oris, MD 03/23/2018, 5:47 PM

## 2018-03-24 DIAGNOSIS — A419 Sepsis, unspecified organism: Secondary | ICD-10-CM

## 2018-03-24 DIAGNOSIS — R404 Transient alteration of awareness: Secondary | ICD-10-CM

## 2018-03-24 DIAGNOSIS — I5023 Acute on chronic systolic (congestive) heart failure: Secondary | ICD-10-CM

## 2018-03-24 LAB — COMPREHENSIVE METABOLIC PANEL
ALT: 119 U/L — ABNORMAL HIGH (ref 0–44)
AST: 168 U/L — ABNORMAL HIGH (ref 15–41)
Albumin: 2 g/dL — ABNORMAL LOW (ref 3.5–5.0)
Alkaline Phosphatase: 43 U/L (ref 38–126)
Anion gap: 13 (ref 5–15)
BUN: 124 mg/dL — ABNORMAL HIGH (ref 8–23)
CO2: 26 mmol/L (ref 22–32)
Calcium: 8.1 mg/dL — ABNORMAL LOW (ref 8.9–10.3)
Chloride: 90 mmol/L — ABNORMAL LOW (ref 98–111)
Creatinine, Ser: 3.17 mg/dL — ABNORMAL HIGH (ref 0.61–1.24)
GFR calc Af Amer: 20 mL/min — ABNORMAL LOW (ref >60)
GFR calc non Af Amer: 17 mL/min — ABNORMAL LOW (ref >60)
Glucose, Bld: 128 mg/dL — ABNORMAL HIGH (ref 70–99)
Potassium: 3.5 mmol/L (ref 3.5–5.1)
Sodium: 129 mmol/L — ABNORMAL LOW (ref 135–145)
Total Bilirubin: 0.9 mg/dL (ref 0.3–1.2)
Total Protein: 5.4 g/dL — ABNORMAL LOW (ref 6.5–8.1)

## 2018-03-24 LAB — CBC WITH DIFFERENTIAL/PLATELET
Abs Immature Granulocytes: 0.24 10*3/uL — ABNORMAL HIGH (ref 0.00–0.07)
Basophils Absolute: 0 10*3/uL (ref 0.0–0.1)
Basophils Relative: 0 %
Eosinophils Absolute: 0.2 10*3/uL (ref 0.0–0.5)
Eosinophils Relative: 1 %
HCT: 32.4 % — ABNORMAL LOW (ref 39.0–52.0)
Hemoglobin: 10.3 g/dL — ABNORMAL LOW (ref 13.0–17.0)
Immature Granulocytes: 2 %
Lymphocytes Relative: 6 %
Lymphs Abs: 0.6 10*3/uL — ABNORMAL LOW (ref 0.7–4.0)
MCH: 27.4 pg (ref 26.0–34.0)
MCHC: 31.8 g/dL (ref 30.0–36.0)
MCV: 86.2 fL (ref 80.0–100.0)
Monocytes Absolute: 0.6 10*3/uL (ref 0.1–1.0)
Monocytes Relative: 5 %
Neutro Abs: 9 10*3/uL — ABNORMAL HIGH (ref 1.7–7.7)
Neutrophils Relative %: 86 %
Platelets: 331 10*3/uL (ref 150–400)
RBC: 3.76 MIL/uL — ABNORMAL LOW (ref 4.22–5.81)
RDW: 16.5 % — ABNORMAL HIGH (ref 11.5–15.5)
WBC: 10.6 10*3/uL — ABNORMAL HIGH (ref 4.0–10.5)
nRBC: 0.4 % — ABNORMAL HIGH (ref 0.0–0.2)

## 2018-03-24 LAB — GLUCOSE, CAPILLARY
GLUCOSE-CAPILLARY: 109 mg/dL — AB (ref 70–99)
GLUCOSE-CAPILLARY: 198 mg/dL — AB (ref 70–99)
Glucose-Capillary: 198 mg/dL — ABNORMAL HIGH (ref 70–99)
Glucose-Capillary: 190 mg/dL — ABNORMAL HIGH (ref 70–99)

## 2018-03-24 LAB — CULTURE, BLOOD (ROUTINE X 2): SPECIAL REQUESTS: ADEQUATE

## 2018-03-24 LAB — MAGNESIUM: Magnesium: 2.2 mg/dL (ref 1.7–2.4)

## 2018-03-24 LAB — BRAIN NATRIURETIC PEPTIDE: B Natriuretic Peptide: 1916.1 pg/mL — ABNORMAL HIGH (ref 0.0–100.0)

## 2018-03-24 MED ORDER — SODIUM CHLORIDE 0.9 % IV SOLN
1.0000 g | Freq: Two times a day (BID) | INTRAVENOUS | Status: DC
  Administered 2018-03-24 – 2018-04-06 (×25): 1 g via INTRAVENOUS
  Filled 2018-03-24 (×27): qty 1

## 2018-03-24 MED ORDER — CAMPHOR-MENTHOL 0.5-0.5 % EX LOTN
TOPICAL_LOTION | CUTANEOUS | Status: DC | PRN
  Administered 2018-03-24 – 2018-03-25 (×3): via TOPICAL
  Filled 2018-03-24: qty 222

## 2018-03-24 MED ORDER — HYDROCORTISONE 1 % EX CREA
1.0000 "application " | TOPICAL_CREAM | Freq: Three times a day (TID) | CUTANEOUS | Status: DC | PRN
  Filled 2018-03-24: qty 28

## 2018-03-24 NOTE — NC FL2 (Signed)
[place] LEVEL OF CARE SCREENING TOOL     IDENTIFICATION  Patient Name: [name] Birthdate: 01-02-1941 Sex: male Admission Date (Current Location): 03/18/2018  [facility] and [place] Number:  Herbalist and Address:  [facility], [place] [address] [place], [place] [address]      Provider Number: [id number]  Attending Physician Name and Address:  Bonnell Public, MD  Relative Name and Phone Number:  Oluwatimileyin Vivier, spouse, [phone]    Current Level of Care: Hospital Recommended Level of Care: [place] Prior Approval Number:    Date Approved/Denied:   PASRR Number: [id number]  Discharge Plan: SNF    Current Diagnoses: Patient Active Problem List   Diagnosis Date Noted  . AKI (acute kidney injury) ([place])   . Acute on chronic congestive heart failure ([place]) 03/18/2018  . Symptomatic anemia 03/08/2018  . Atrial fibrillation ([place]) 03/08/2018  . Hyperlipidemia 03/08/2018  . Chronic back pain 03/08/2018  . Combined systolic and diastolic heart failure ([place]) 03/08/2018  . GI bleed 03/07/2018  . Coronary artery disease involving coronary bypass graft of native heart without angina pectoris   . Benign essential HTN   . PAD (peripheral artery disease) ([place])   . New onset atrial fibrillation ([place])   . Tachypnea   . Hyponatremia   . Malnutrition of moderate degree 02/27/2018  . Volume overload 11/10/2017  . Surgical wound infection 11/04/2017  . Polyneuropathy associated with underlying disease ([place]) 08/16/2017  . Essential hypertension 02/06/2014  . Acute on chronic combined systolic and diastolic CHF (congestive heart failure) ([place]) 02/06/2014  . Chronic kidney disease, stage III (moderate) ([place]) 02/06/2014  . Aftercare following surgery of the circulatory system, [place] 01/25/2014  . Carotid stenosis 06/08/2013  . Stricture of artery ([place]) 04/14/2012  . Peripheral vascular disease, unspecified ([place])  10/08/2011  . DVT of upper extremity (deep vein thrombosis), left 09/29/2011  . S/P CABG (coronary artery bypass graft) 09/29/2011  . H/O carotid endarterectomy 09/29/2011  . Occlusion and stenosis of carotid artery without mention of cerebral infarction 06/11/2011  . Diabetes mellitus type 2 with complications ([place]) 10/93/2355    Orientation RESPIRATION BLADDER Height & Weight     Self, Time, Situation, Place  O2(nasal cannula 2L) Incontinent, External catheter Weight: 115.8 kg Height:     BEHAVIORAL SYMPTOMS/MOOD NEUROLOGICAL BOWEL NUTRITION STATUS      Incontinent Diet(heart healthy; please see DC summary)  AMBULATORY STATUS COMMUNICATION OF NEEDS Skin   Extensive Assist Verbally Other (Comment)(open wound L leg)                       Personal Care Assistance Level of Assistance    Bathing Assistance: Limited assistance Feeding assistance: Independent Dressing Assistance: Limited assistance     Functional Limitations Info    Sight Info: Impaired Hearing Info: Impaired Speech Info: Adequate    SPECIAL CARE FACTORS FREQUENCY  PT (By licensed PT), OT (By licensed OT)     PT Frequency: 5x/week OT Frequency: 5x/week            Contractures Contractures Info: Not present    Additional Factors Info  Code Status, Allergies, Insulin Sliding Scale Code Status Info: Full Allergies Info: Amoxicillin-pot Clavulanate, Morphine And Related, Percocet Oxycodone-acetaminophen, Restoril Temazepam   Insulin Sliding Scale Info: novolog 3x/day with meals and at bedtime, lantus at bedtime       Current Medications (03/24/2018):  This is the current hospital active  medication list Current Facility-Administered Medications  Medication Dose Route Frequency Provider Last Rate Last Dose  . acetaminophen (TYLENOL) suppository 650 mg  650 mg Rectal Q4H PRN Jerline Pain, MD      . acetaminophen (TYLENOL) tablet 650 mg  650 mg Oral Q6H PRN Jerline Pain, MD   650 mg at 03/22/18  1953  . amiodarone (PACERONE) tablet 200 mg  200 mg Oral BID Jerline Pain, MD   200 mg at 03/23/18 2252  . atorvastatin (LIPITOR) tablet 20 mg  20 mg Oral QPC supper Jerline Pain, MD   20 mg at 03/23/18 1733  . camphor-menthol (SARNA) lotion   Topical PRN Schorr, Rhetta Mura, NP      . carvedilol (COREG) tablet 3.125 mg  3.125 mg Oral BID Cherene Altes, MD   3.125 mg at 03/23/18 2252  . cefTRIAXone (ROCEPHIN) 2 g in sodium chloride 0.9 % 100 mL IVPB  2 g Intravenous Q24H Reginia Naas, RPH 200 mL/hr at 03/23/18 2036 2 g at 03/23/18 2036  . fenofibrate tablet 54 mg  54 mg Oral Daily Jerline Pain, MD   54 mg at 03/23/18 9563  . furosemide (LASIX) 160 mg in dextrose 5 % 50 mL IVPB  160 mg Intravenous BID Larey Dresser, MD   Stopped at 03/23/18 2138  . gabapentin (NEURONTIN) capsule 100 mg  100 mg Oral BID PC Cherene Altes, MD   100 mg at 03/23/18 1734  . hydrocortisone cream 1 % 1 application  1 application Topical TID PRN Theora Gianotti, RN      . insulin aspart (novoLOG) injection 0-20 Units  0-20 Units Subcutaneous TID WC Jerline Pain, MD   3 Units at 03/23/18 1247  . insulin aspart (novoLOG) injection 0-5 Units  0-5 Units Subcutaneous QHS Jerline Pain, MD   2 Units at 03/22/18 2115  . insulin glargine (LANTUS) injection 8 Units  8 Units Subcutaneous QHS Cherene Altes, MD   8 Units at 03/22/18 2110  . levothyroxine (SYNTHROID, LEVOTHROID) tablet 100 mcg  100 mcg Oral QAC breakfast Jerline Pain, MD   100 mcg at 03/24/18 0606  . nitroGLYCERIN (NITROSTAT) SL tablet 0.4 mg  0.4 mg Sublingual Q5 min PRN Jerline Pain, MD      . ondansetron (ZOFRAN) injection 4 mg  4 mg Intravenous Q6H PRN Jerline Pain, MD      . pantoprazole (PROTONIX) EC tablet 40 mg  40 mg Oral Daily Jerline Pain, MD   40 mg at 03/23/18 0823  . tamsulosin (FLOMAX) capsule 0.4 mg  0.4 mg Oral Q M,W,F Jerline Pain, MD   0.4 mg at 03/23/18 8756     Discharge Medications: Please see  discharge summary for a list of discharge medications.  Relevant Imaging Results:  Relevant Lab Results:   Additional Information SSN: [id number]  Estanislado Emms, LCSW

## 2018-03-24 NOTE — Progress Notes (Addendum)
Advanced Heart Failure Rounding Note  PCP-Cardiologist: Belva Crome III, MD   Subjective:    Admitted with volume overload and worsening renal failure. High dose IV lasix given yesterday with sluggish UOP. Weight up 1 lb.   Nephrology consulted yesterday. Discussed with family and chose to consult palliative instead of going forward with HD.   No labs this morning. Remain on Ceftriaxone for UTI. Blood cultures were positive for klebsiella pneumonia. Repeat blood cultures sent overnight.   Spoke to RN who says family is planning to transition to comfort care today. Family not present this am.   Pt uremic. Unable to answer any questions appropriately. Tender to touch all over.   Objective:   Weight Range: 115.8 kg Body mass index is 35.59 kg/m.   Vital Signs:   Temp:  [97.6 F (36.4 C)-98.8 F (37.1 C)] 98 F (36.7 C) (10/31 0806) Pulse Rate:  [53-62] 62 (10/31 0806) Resp:  [15] 15 (10/31 0806) BP: (103-144)/(64-79) 108/66 (10/31 0806) SpO2:  [96 %-100 %] 98 % (10/31 0806) Weight:  [115.8 kg] 115.8 kg (10/31 0457) Last BM Date: 03/23/18  Weight change: Filed Weights   03/22/18 0300 03/23/18 0528 03/24/18 0457  Weight: 117.3 kg 115.5 kg 115.8 kg    Intake/Output:   Intake/Output Summary (Last 24 hours) at 03/24/2018 0807 Last data filed at 03/24/2018 0629 Gross per 24 hour  Intake 116 ml  Output 1175 ml  Net -1059 ml      Physical Exam    General:  Appears chronically ill. No resp difficulty. Confused.  HEENT: jaundiced Neck: Supple. JVP to jaw. Carotids 2+ bilat; no bruits. No lymphadenopathy or thyromegaly appreciated. Cor: PMI nondisplaced. Regular rate & rhythm, slightly brady. No rubs, gallops or murmurs. Lungs: Clear Abdomen: Soft, nontender, nondistended. No hepatosplenomegaly. No bruits or masses. Good bowel sounds. Extremities: No cyanosis, clubbing, rash, BLE 1-2+ edema. Tender to touch.  Neuro: Confused. Unable to answer questions  appropriately.    Telemetry   NSR 50s. Personally reviewed.   EKG    No new tracings.   Labs    CBC Recent Labs    03/22/18 0320 03/23/18 0417  WBC 10.8* 10.0  HGB 10.0* 10.0*  HCT 31.1* 31.1*  MCV 86.9 85.9  PLT 281 329   Basic Metabolic Panel Recent Labs    03/22/18 0320 03/23/18 0417  NA 126* 126*  K 4.7 4.7  CL 87* 89*  CO2 25 25  GLUCOSE 211* 171*  BUN 106* 120*  CREATININE 2.92* 3.33*  CALCIUM 8.4* 8.2*   Liver Function Tests Recent Labs    03/23/18 0417  AST 394*  ALT 139*  ALKPHOS 41  BILITOT 1.0  PROT 5.5*  ALBUMIN 1.9*   No results for input(s): LIPASE, AMYLASE in the last 72 hours. Cardiac Enzymes No results for input(s): CKTOTAL, CKMB, CKMBINDEX, TROPONINI in the last 72 hours.  BNP: BNP (last 3 results) Recent Labs    02/25/18 1340 03/18/18 1325  BNP 1,809.7* 2,440.0*    ProBNP (last 3 results) Recent Labs    02/03/18 1217 02/24/18 1243  PROBNP 10,666* 17,251*     D-Dimer No results for input(s): DDIMER in the last 72 hours. Hemoglobin A1C No results for input(s): HGBA1C in the last 72 hours. Fasting Lipid Panel No results for input(s): CHOL, HDL, LDLCALC, TRIG, CHOLHDL, LDLDIRECT in the last 72 hours. Thyroid Function Tests No results for input(s): TSH, T4TOTAL, T3FREE, THYROIDAB in the last 72 hours.  Invalid input(s): FREET3  Other results:   Imaging     No results found.   Medications:     Scheduled Medications: . amiodarone  200 mg Oral BID  . atorvastatin  20 mg Oral QPC supper  . carvedilol  3.125 mg Oral BID  . fenofibrate  54 mg Oral Daily  . gabapentin  100 mg Oral BID PC  . insulin aspart  0-20 Units Subcutaneous TID WC  . insulin aspart  0-5 Units Subcutaneous QHS  . insulin glargine  8 Units Subcutaneous QHS  . levothyroxine  100 mcg Oral QAC breakfast  . pantoprazole  40 mg Oral Daily  . tamsulosin  0.4 mg Oral Q M,W,F     Infusions: . cefTRIAXone (ROCEPHIN)  IV 2 g (03/23/18  2036)  . furosemide Stopped (03/23/18 2138)     PRN Medications:  acetaminophen, acetaminophen, camphor-menthol, hydrocortisone cream, nitroGLYCERIN, ondansetron (ZOFRAN) IV    Patient Profile   Mr [name] is a 77 year old with a history of chronic combined systolic/diastolic heart failure, PAF, GI bleed, PAD, memory impairment, CAD, CABG, hypterlipidemia, CKD, DMII.   Admitted with increased fatigue and dyspnea.   Assessment/Plan   1. A/C Systolic Heart Failure,  TEE this admit with EF down to 30%. - Sluggish UOP with 160 mg IV lasix x1.  - No ace/arb/spiro/dig with AKI  2. A fib RVR S/P TEE/DC-CV - Maintaining sinus brady.  - Continue amiodarone 200 mg twice a day - Eliquis on hold for possible HD cathter, but pt planning to transition to comfort care today.   3. AKI on CKD Stage III - Renal function up to 3.33 yesterday. No labs yet today.  - Nephrology consulted yesterday and discussed in depth with family. They chose palliative care over aggressive HD.  - Appears uremic  4. ID + Blood CX ---> klebsiella pneumoniae. Repeat blood cultures sent overnight.  - On ceftriaxone. Afebrile. Very confused.   5. Hyponatremia  - No labs today. Na 126 yesterday.   6. Elevated LFTs  7. CAD  CABG in past. No change.   8. PAD  S/P Aorto Bifem BPG 09/2017. No change.   Family planning to transition to comfort care per RN. I spoke with hospice RN today to make sure he is seen early today. Will hold off on checking BMET with him moving toward comfort care.  Medication concerns reviewed with patient and pharmacy team. Barriers identified: none.    Length of Stay: [place], NP  03/24/2018, 8:07 AM  Advanced Heart Failure Team Pager [phone] (M-F; 7a - 4p)  Please contact [facility] for night-coverage after hours (4p -7a ) and weekends on [email or web address]  Patient seen and examined with the above-signed Advanced Practice Provider and/or Housestaff. I  personally reviewed laboratory data, imaging studies and relevant notes. I independently examined the patient and formulated the important aspects of the plan. I have edited the note to reflect any of my changes or salient points. I have personally discussed the plan with the patient and/or family.  He remains severely uremic. BUN 120. Creatinine up to 3.3. No significant response to high-dose diuretics. Family planning transition to comfort care. The HF team will sign off.   Glori Bickers, MD  10:17 AM

## 2018-03-24 NOTE — Progress Notes (Signed)
[place] TEAM 1 - Stepdown/ICU TEAM  [name]  IZT:[id number] DOB: [birth year] DOA: 03/18/2018 PCP: Lavone Orn, MD    Brief Narrative:  77 y.o. male with a hx of systolic heart failure, atrial flutter, recent GI bleed, CAD s/p CABG, hypothyroidism, DM, GERD, HTN, HLD, stage III/stage IV CKD, and PAD who was recently discharged from the hospital after a GI bleed who presented w/ generalized weakness, and inability to ambulate, and shortness of breath.    On presentation to the hospital, patient was noted to be in atrial fibrillation with rapid ventricular response, acute kidney injury on chronic kidney disease stage III/IV, acute on chronic combined systolic and diastolic congestive heart failure with recent echocardiogram revealing EF of 30 to 38%, grade 2 diastolic dysfunction and decreased right ventricular systolic function.  UA is suggestive of likely UTI and blood culture is growing Klebsiella pneumonia, suspect likely secondary to UTI/sepsis.  Worsening mental status is noted.  Sodium is 126.  Patient remains volume overloaded, despite being on high-dose Lasix.  03/23/2018: BUN is 120 and serum creatinine is 3.3, with baseline serum creatinine of 1.8 to 2.  Albumin is 1.9, AST of 394, ALT of 139.  Patient was seen alongside patient's nurse and wife.  Updated patient's wife extensively.  Patient could not participate reasonably with a consultation due to altered mentation, possibly secondary to uremia.  To date, no GI bleed noted.  Nephrology and cardiology team consulted.  Guarded/poor prognosis, communicated to the patient's wife.  Will consult palliative care service.  Further management will depend on hospital course.  03/24/2018: Patient seen alongside patient's wife, daughter, son and daughter-in-law.  Blood culture final result grew Klebsiella ESBL.  We change antibiotics from IV Rocephin to meropenem.  Patient's family is expecting Dr. Tamala Julian, patient's cardiologist to have an  input in goal of care discussion.  For now, will continue to treat patient.  Cardiology and nephrology input is highly appreciated.  Awaiting stool fecal occult blood to rule out occult GI bleed.  Subjective: Patient is more awake today, but remains lethargic.  BUN remains significantly elevated.  Assessment & Plan: Acute exacerbation of chronic combined systolic and diastolic CHF: -Consult heart failure team. -Query diuresis resistant. -BUN of 120, serum creatinine of 3.3, and with likely uremia - query role of hemodialysis, but patient may not be a good candidate due to multiple cold morbidities. -Latest echo revealed EF of 30 to 35% (kindly see above). - Await further recommendation from the heart failure team. - Nephrology team also consulted.  Atrial flutter/Atrial Fib with rapid ventricular response: Cardiology is directing care  Eliquis was held during recent admit for GIB, but resumed at time of d/c  S/P TEE DCCV  UTI/bacteremia/sepsis, likely secondary to Klebsiella: 03/24/2018: Blood culture final result revealed Klebsiella pneumonia, ESBL. Will discontinue IV Rocephin. Start IV meropenem.  Hypoalbuminemia:  Indicate poor prognosis.    Acute kidney injury on chronic kidney disease stage III/IV:  Nephrology input is appreciated.  As per nephrology team, not ideal candidate for dialysis. We will continue to assess. Guarded prognosis.  Recent Labs  Lab 03/20/18 0537 03/21/18 0509 03/22/18 0320 03/23/18 0417 03/24/18 1019  CREATININE 2.72* 2.84* 2.92* 3.33* 3.17*     Hypokalemia: corrected   Hyponatremia: Due to CHF May have prognostic significance.   DM2: Continue to optimize.  HLD  Anemia of chronic kidney disease and recent GIB Follow trend - Hgb stable    GIB Oct 2019 EGD 03/10/18 w/o clear source of  bleeding   CAD s/p CABG Has suspected graft failure - not a candidate for cardiac cath given kidney disease   Cystic lesion of pancreas Noted on  MRI abdom earlier this month - mildly elevated CA 19-9 not felt to be worrisome per GI   Consult palliative team.   DVT prophylaxis: eliquis  Code Status: FULL CODE Family Communication: spoke wife.    Disposition Plan: Will depend on hospital course.    Consultants:  [facility] Card[facility] Heart failure team. Nephrology.  Antimicrobials:  IV Rocephin discontinued today 03/24/2018. IV meropenem  Objective: Blood pressure (!) 130/51, pulse (!) 54, temperature 98 F (36.7 C), temperature source Oral, resp. rate 16, weight 115.8 kg, SpO2 100 %.  Intake/Output Summary (Last 24 hours) at 03/24/2018 1739 Last data filed at 03/24/2018 1640 Gross per 24 hour  Intake 956 ml  Output 875 ml  Net 81 ml   Filed Weights   03/22/18 0300 03/23/18 0528 03/24/18 0457  Weight: 117.3 kg 115.5 kg 115.8 kg    Examination: General: Lethargic.  Probably uremic.   Lungs: Decreased air entry. Cardiovascular: S1-S2. Abdomen: Nontender, obese, soft, BS+, no rebound Extremities: 3+ B LE edema   CBC: Recent Labs  Lab 03/18/18 1325  03/22/18 0320 03/23/18 0417 03/24/18 1019  WBC 8.6   < > 10.8* 10.0 10.6*  NEUTROABS 7.5  --   --   --  9.0*  HGB 8.8*   < > 10.0* 10.0* 10.3*  HCT 27.7*   < > 31.1* 31.1* 32.4*  MCV 88.8   < > 86.9 85.9 86.2  PLT 235   < > 281 316 331   < > = values in this interval not displayed.   Basic Metabolic Panel: Recent Labs  Lab 03/18/18 1325  03/20/18 0537  03/22/18 0320 03/23/18 0417 03/24/18 1019  NA 130*   < > 132*   < > 126* 126* 129*  K 2.4*   < > 3.2*   < > 4.7 4.7 3.5  CL 92*   < > 88*   < > 87* 89* 90*  CO2 24   < > 29   < > 25 25 26   GLUCOSE 224*   < > 173*   < > 211* 171* 128*  BUN 75*   < > 88*   < > 106* 120* 124*  CREATININE 2.51*   < > 2.72*   < > 2.92* 3.33* 3.17*  CALCIUM 8.2*   < > 8.6*   < > 8.4* 8.2* 8.1*  MG 1.9  --  2.3  --   --   --  2.2   < > = values in this interval not displayed.   GFR: Estimated Creatinine Clearance: 24.9  mL/min (A) (by C-G formula based on SCr of 3.17 mg/dL (H)).  Liver Function Tests: Recent Labs  Lab 03/20/18 0537 03/21/18 0509 03/23/18 0417 03/24/18 1019  AST 49* 50* 394* 168*  ALT 22 29 139* 119*  ALKPHOS 40 39 41 43  BILITOT 1.2 1.1 1.0 0.9  PROT 5.7* 5.8* 5.5* 5.4*  ALBUMIN 2.2* 2.2* 1.9* 2.0*    Cardiac Enzymes: Recent Labs  Lab 03/18/18 1325  TROPONINI 0.03*    HbA1C: Hgb A1c MFr Bld  Date/Time Value Ref Range Status  10/21/2017 12:33 PM 6.2 (H) 4.8 - 5.6 % Final    Comment:    (NOTE) Pre diabetes:          5.7%-6.4% Diabetes:              >  6.4% Glycemic control for   <7.0% adults with diabetes   09/24/2011 05:50 AM 6.4 (H) <5.7 % Final    Comment:    (NOTE)                                                                       According to the ADA Clinical Practice Recommendations for 2011, when HbA1c is used as a screening test:  >=6.5%   Diagnostic of Diabetes Mellitus           (if abnormal result is confirmed) 5.7-6.4%   Increased risk of developing Diabetes Mellitus References:Diagnosis and Classification of Diabetes Mellitus,Diabetes TWSF,6812,75(TZGYF 1):S62-S69 and Standards of Medical Care in         Diabetes - 2011,Diabetes VCBS,4967,59 (Suppl 1):S11-S61.    CBG: Recent Labs  Lab 03/23/18 1659 03/23/18 2120 03/24/18 0746 03/24/18 1130 03/24/18 1634  GLUCAP 116* 122* 109* 198* 198*    Scheduled Meds: . amiodarone  200 mg Oral BID  . atorvastatin  20 mg Oral QPC supper  . carvedilol  3.125 mg Oral BID  . fenofibrate  54 mg Oral Daily  . gabapentin  100 mg Oral BID PC  . insulin aspart  0-20 Units Subcutaneous TID WC  . insulin aspart  0-5 Units Subcutaneous QHS  . insulin glargine  8 Units Subcutaneous QHS  . levothyroxine  100 mcg Oral QAC breakfast  . pantoprazole  40 mg Oral Daily  . tamsulosin  0.4 mg Oral Q M,W,F   Continuous Infusions: . furosemide 160 mg (03/24/18 1019)  . meropenem (MERREM) IV       LOS: 6 days    Bonnell Public, MD [facility] Office  [phone] Pager - Text Page per Amion  If 7PM-7AM, please contact night-coverage per Amion 03/24/2018, 5:39 PM

## 2018-03-24 NOTE — Consult Note (Signed)
Consultation Note Date: 03/24/2018   Patient Name: [name]  DOB: 04-10-1941  MRN: [id number]  Age / Sex: 77 y.o., male  PCP: Lavone Orn, MD Referring Physician: Bonnell Public, MD  Reason for Consultation: Establishing goals of care and Psychosocial/spiritual support  HPI/Patient Profile: 77 y.o. male  admitted on 03/18/2018 with PMH significant for  systolic CHF, aflutter, recent GIB, CASHD s/p CABG, DM, HTN, PAD, CKDIIIB/IV with recent GIB here w/ weakness and dyspnea found to be in HF and a acute on chronic renal failure w/ BUN/Cr of 75/2.51. Also found to be in afib RVR, started on high dose IV Lasix and CV on 10/29 with conversion to NSR. Also bacteremic with E. Coli and Klebsiella Pneumoniae tx w/ Cefepime and then changed to Rocephin  Per cardiology --AKI on advanced CKD IIIB/IV with poor reserve and now in cardiorenal syndrome +/- superimposed ATN.  Not considered a dialysis candidate per cardiology and nephrology.   Several  hospitalizations over the past several months.  Family report continued physical and functional decline.  Today the patient intermittently confused  Patient and family face treatment option decisions, advance directive decisions and anticipatory care needs    Clinical Assessment and Goals of Care:  This NP [name] reviewed medical records, received report from team, assessed the patient and then meet at the patient's bedside along with his wife and daughter/[name]  to discuss diagnosis, prognosis, GOC, EOL wishes disposition and options.  Concept of Hospice and Palliative Care were discussed  Initially family verbalized their desire for a more comfort approach and had many questions regarding hospice.  I attempted to answer their questions as best I could and then spoke to hospice of [facility] Venia Carbon and requested her to come to offer  education for this family.  [name] spoke to the family and they very much appreciated the information.  We discussed the complexity of the current medical situation.    We  discussed human mortality and the  limitations of medical interventions to prolong quality of life when a body fails to thrive    Family understand the seriousness of the situation but for now they wish to continue to treat the treatable.  They are open to all offered and available medical interventions to prolong life. Plan is to take it one day at a time, see how the patient does through the weekend and reevaluate on Monday for clarification of goals of care.  A detailed discussion was had today regarding advanced directives.  Concepts specific to code status, artifical feeding and hydration, continued IV antibiotics and rehospitalization was had.  The difference between a aggressive medical intervention path  and a palliative comfort care path for this patient at this time was had.  Values and goals of care important to patient and family were attempted to be elicited.  MOST form introduced  Natural trajectory and expectations at EOL were discussed.  Questions and concerns addressed.   Family encouraged to call with questions or concerns.  PMT will continue to support holistically.   HCPOA/ wife Alanmichael Barmore    SUMMARY OF RECOMMENDATIONS    Code Status/Advance Care Planning:  Full code  Encouraged family to consider documentation of DNR/DNI knowing poor outcomes in similar patients.  Family is hopeful that Mr. Chauncy Passy will become more cognisant over the next few days and be able to make advanced directive decisions for himself.   Palliative Prophylaxis:   Aspiration, Bowel Regimen, Delirium Protocol and Oral Care  Additional Recommendations (Limitations, Scope, Preferences):  Full Scope Treatment   This time family is open to all offered and available medical interventions to prolong life.  They are  hopeful for improvement.   Psycho-social/Spiritual:   Desire for further Chaplaincy support:no  Additional Recommendations: Education on Hospice  Prognosis:   Unable to determine  Discharge Planning: To Be Determined      Primary Diagnoses: Present on Admission: . Symptomatic anemia . Volume overload . New onset atrial fibrillation ([place]) . Hyponatremia . Hyperlipidemia . GI bleed . Essential hypertension . Diabetes mellitus type 2 with complications ([place]) . Chronic kidney disease, stage III (moderate) (HCC) . Combined systolic and diastolic heart failure ([place]) . Benign essential HTN   I have reviewed the medical record, interviewed the patient and family, and examined the patient. The following aspects are pertinent.  Past Medical History:  Diagnosis Date  . Arthritis   . Atherosclerotic heart disease   . Back pain   . Bruises easily    takes Pletal daily and ASA 325mg  daily  . Cataract immature    right  . Chest pain   . Colon polyps   . Complication of anesthesia    hard to wake up with bypass surgery  . Cyst    on perineum;takes Doxycycline daily  . Diabetes mellitus    takes Glipizide,Metformin,and Actos daily  . Diarrhea   . Dyspnea    when lying flat  . Edema    right leg knee down swollen since fall 3 wks ago  . Foot drop, left   . GERD (gastroesophageal reflux disease)    Rolaids as needed-occ reflux  . Hemorrhoids   . History of kidney stones    at age 33   . Hyperlipidemia    takes Tricor and Lipitor daily  . Hypertension    takes Altace,Amlodipine,and Nadolol daily  . Hypothyroidism   . Kidney stone    35+yrs ago  . Muscle pain   . Nasal polyps    hx of  . Peripheral edema    wears knee length hose-told by podiatrist to wear these  . Peripheral neuropathy    takes Gabapentin daily  . Pneumonia    as a child  . PVD (peripheral vascular disease) ([place])   . Renal insufficiency   . Seasonal allergies    takes Mucinex and Zyrtec  prn  . Urinary frequency    urgency/takes Vesicare daily   Social History   Socioeconomic History  . Marital status: Married    Spouse name: Not on file  . Number of children: 2  . Years of education: Not on file  . Highest education level: Professional school degree (e.g., MD, DDS, DVM, JD)  Occupational History  . Occupation: retired Chief Executive Officer  Social Needs  . Financial resource strain: Not on file  . Food insecurity:    Worry: Not on file    Inability: Not on file  . Transportation needs:    Medical: Not on file  Non-medical: Not on file  Tobacco Use  . Smoking status: Former Smoker    Years: 40.00    Types: Cigarettes    Last attempt to quit: 10/27/2005    Years since quitting: 12.4  . Smokeless tobacco: Never Used  Substance and Sexual Activity  . Alcohol use: Yes    Alcohol/week: 2.0 - 3.0 standard drinks    Types: 2 - 3 Cans of beer per week    Comment: occasional- weekly  . Drug use: No  . Sexual activity: Never  Lifestyle  . Physical activity:    Days per week: Not on file    Minutes per session: Not on file  . Stress: Not on file  Relationships  . Social connections:    Talks on phone: Patient refused    Gets together: Patient refused    Attends religious service: Patient refused    Active member of club or organization: Patient refused    Attends meetings of clubs or organizations: Patient refused    Relationship status: Patient refused  Other Topics Concern  . Not on file  Social History Narrative   He lives with wife in a 1 story home, three steps to entire home.  2 children.  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     Family History  Problem Relation Age of Onset  . Heart disease Mother   . Heart disease Father   . Anesthesia problems Neg Hx   . Hypotension Neg Hx   . Malignant hyperthermia Neg Hx   . Pseudochol deficiency Neg Hx    Scheduled Meds: . amiodarone  200 mg Oral BID  . atorvastatin  20 mg Oral QPC supper  . carvedilol  3.125 mg  Oral BID  . fenofibrate  54 mg Oral Daily  . gabapentin  100 mg Oral BID PC  . insulin aspart  0-20 Units Subcutaneous TID WC  . insulin aspart  0-5 Units Subcutaneous QHS  . insulin glargine  8 Units Subcutaneous QHS  . levothyroxine  100 mcg Oral QAC breakfast  . pantoprazole  40 mg Oral Daily  . tamsulosin  0.4 mg Oral Q M,W,F   Continuous Infusions: . cefTRIAXone (ROCEPHIN)  IV 2 g (03/23/18 2036)  . furosemide Stopped (03/23/18 2138)   PRN Meds:.acetaminophen, acetaminophen, camphor-menthol, hydrocortisone cream, nitroGLYCERIN, ondansetron (ZOFRAN) IV Medications Prior to Admission:  Prior to Admission medications   Medication Sig Start Date End Date Taking? Authorizing Provider  acetaminophen (TYLENOL) 500 MG tablet Take 1,000 mg by mouth every 6 (six) hours as needed for headache (pain).   Yes [provider]  apixaban (ELIQUIS) 5 MG TABS tablet Take 1 tablet (5 mg total) by mouth 2 (two) times daily. Patient taking differently: Take 5 mg by mouth 2 (two) times daily after a meal.  03/03/18 04/02/18 Yes Arrien, Jimmy Picket, MD  Ascorbic Acid (VITAMIN C PO) Take 1 tablet by mouth 2 (two) times daily.   Yes [provider]  atorvastatin (LIPITOR) 20 MG tablet Take 20 mg by mouth daily after supper.    Yes [provider]  carvedilol (COREG) 25 MG tablet Take 1 tablet (25 mg total) by mouth 2 (two) times daily. Patient taking differently: Take 25 mg by mouth 2 (two) times daily after a meal.  03/03/18 04/02/18 Yes Arrien, Jimmy Picket, MD  fenofibrate 54 MG tablet Take 1 tablet (54 mg total) by mouth daily. 03/03/18 04/02/18 Yes Arrien, Jimmy Picket, MD  gabapentin (NEURONTIN) 300 MG capsule Take 300  mg by mouth 2 (two) times daily after a meal.    Yes [provider]  levothyroxine (SYNTHROID, LEVOTHROID) 100 MCG tablet Take 100 mcg by mouth daily before breakfast.  01/07/18  Yes [provider]  methylcellulose (ARTIFICIAL TEARS) 1  % ophthalmic solution Place 1 drop into both eyes 2 (two) times daily as needed (dry eyes).   Yes [provider]  metolazone (ZAROXOLYN) 5 MG tablet Take 5 mg by mouth See admin instructions. Take one tablet (5 mg) by mouth when directed to do so by cardiologist.   Yes [provider]  Multiple Vitamin (MULTIVITAMIN WITH MINERALS) TABS tablet Take 1 tablet by mouth daily. 12/03/17  Yes Rhyne, Samantha J, PA-C  nitroGLYCERIN (NITROSTAT) 0.4 MG SL tablet PLACE 1 TABLET UNDER TONGUE EVERY 5 MINUTES AS NEEDED FOR CHEST PAIN. Patient taking differently: Place 0.4 mg under the tongue every 5 (five) minutes as needed for chest pain.  09/17/15  Yes Belva Crome, MD  oxymetazoline (VICKS SINEX) 0.05 % nasal spray Place 1 spray into both nostrils 2 (two) times daily as needed for congestion.   Yes [provider]  pantoprazole (PROTONIX) 40 MG tablet Take 1 tablet (40 mg total) by mouth daily. 03/11/18 03/11/19 Yes Pokhrel, Laxman, MD  pioglitazone (ACTOS) 30 MG tablet Take 1 tablet (30 mg total) by mouth daily with lunch. Patient taking differently: Take 30 mg by mouth daily after supper.  12/03/17  Yes Rhyne, Samantha J, PA-C  potassium chloride SA (K-DUR,KLOR-CON) 20 MEQ tablet Take by mouth as directed. Patient taking differently: Take 20 mEq by mouth See admin instructions. Take one tablet (20 meq) by mouth when directed to do so by MD 03/18/18  Yes Isaiah Serge, NP  pseudoephedrine-guaifenesin (MUCINEX D) 60-600 MG per tablet Take 1 tablet by mouth every 12 (twelve) hours as needed for congestion.    Yes [provider]  sodium chloride (OCEAN) 0.65 % SOLN nasal spray Place 1 spray into both nostrils 2 (two) times daily as needed for congestion.   Yes [provider]  tamsulosin (FLOMAX) 0.4 MG CAPS capsule Take 0.4 mg by mouth every Monday, Wednesday, and Friday.    Yes [provider]  torsemide (DEMADEX) 20 MG tablet Take 3 tablets (60 mg total)  by mouth 2 (two) times daily. Patient taking differently: Take 60 mg by mouth 2 (two) times daily after a meal.  02/03/18  Yes Belva Crome, MD  vitamin B-12 (CYANOCOBALAMIN) 500 MCG tablet Take 500 mcg by mouth daily.   Yes [provider]  zinc gluconate 50 MG tablet Take 50 mg by mouth daily.   Yes [provider]  amiodarone (PACERONE) 200 MG tablet Take 1 tablet (200 mg total) by mouth 2 (two) times daily. 03/17/18   Isaiah Serge, NP   Allergies  Allergen Reactions  . Amoxicillin-Pot Clavulanate Nausea Only    Has patient had a PCN reaction causing immediate rash, facial/tongue/throat swelling, SOB or lightheadedness with hypotension: No Has patient had a PCN reaction causing severe rash involving mucus membranes or skin necrosis: No Has patient had a PCN reaction that required hospitalization: No Has patient had a PCN reaction occurring within the last 10 years: No If all of the above answers are "NO", then may proceed with Cephalosporin use.   Marland Kitchen Morphine And Related Other (See Comments)    hallucinations  . Percocet [Oxycodone-Acetaminophen] Other (See Comments)    Agitation and Increased Confusion. Family asks to please  not order this medication for him.   . Restoril [Temazepam] Other (See Comments)    Confusion and Agitation. Has taken in past, but has not reacted well. Family requests to not give this mediation.    Review of Systems  Unable to perform ROS: Mental status change    Physical Exam  Constitutional: He appears well-developed.  Skin: Skin is warm and dry.    Vital Signs: BP 108/66   Pulse 62   Temp 98 F (36.7 C) (Oral)   Resp 15   Wt 115.8 kg   SpO2 98%   BMI 35.59 kg/m  Pain Scale: 0-10   Pain Score: 0-No pain   SpO2: SpO2: 98 % O2 Device:SpO2: 98 % O2 Flow Rate: .O2 Flow Rate (L/min): 2 L/min  IO: Intake/output summary:   Intake/Output Summary (Last 24 hours) at 03/24/2018 0913 Last data filed at 03/24/2018 2811 Gross  per 24 hour  Intake 116 ml  Output 1175 ml  Net -1059 ml    LBM: Last BM Date: 03/23/18 Baseline Weight: Weight: 115.9 kg Most recent weight: Weight: 115.8 kg     Palliative Assessment/Data: 30 % at best   Discussed with nursing and Dr Marthenia Rolling  This NP will f/u on Monday  Time In: 0900 Time Out: 1015 Time Total: 75 minutes Greater than 50%  of this time was spent counseling and coordinating care related to the above assessment and plan.  Signed by: [name], NP   Please contact Palliative Medicine Team phone at [phone] for questions and concerns.  For individual provider: See Shea Evans

## 2018-03-24 NOTE — Progress Notes (Signed)
Pharmacy Antibiotic Note  [name] is a 77 y.o. male admitted on 03/18/2018 with bacteremia.  Pharmacy has been consulted for Merrem dosing.  He has been on ceftriaxone for his klebsiella bacteremia. Sens came back with ESBL. D/w Dr. Marthenia Rolling today and will change to [facility].   Plan: Dc ceftriaxone Merrem 1g IV q12  Weight: 255 lb 3.2 oz (115.8 kg)  Temp (24hrs), Avg:97.8 F (36.6 C), Min:97.6 F (36.4 C), Max:98 F (36.7 C)  Recent Labs  Lab 03/20/18 0537 03/21/18 0509 03/22/18 0320 03/23/18 0417 03/24/18 1019  WBC 9.4 9.2 10.8* 10.0 10.6*  CREATININE 2.72* 2.84* 2.92* 3.33* 3.17*    Estimated Creatinine Clearance: 24.9 mL/min (A) (by C-G formula based on SCr of 3.17 mg/dL (H)).    Allergies  Allergen Reactions  . Amoxicillin-Pot Clavulanate Nausea Only    Has patient had a PCN reaction causing immediate rash, facial/tongue/throat swelling, SOB or lightheadedness with hypotension: No Has patient had a PCN reaction causing severe rash involving mucus membranes or skin necrosis: No Has patient had a PCN reaction that required hospitalization: No Has patient had a PCN reaction occurring within the last 10 years: No If all of the above answers are "NO", then may proceed with Cephalosporin use.   Marland Kitchen Morphine And Related Other (See Comments)    hallucinations  . Percocet [Oxycodone-Acetaminophen] Other (See Comments)    Agitation and Increased Confusion. Family asks to please not order this medication for him.   . Restoril [Temazepam] Other (See Comments)    Confusion and Agitation. Has taken in past, but has not reacted well. Family requests to not give this mediation.     Antimicrobials this admission: 10/27 cefepime >> 10/29 10/29 ceftriaxone >> 10/31 10/31 Merrem>>  Dose adjustments this admission:   Onnie Boer, PharmD, [place], AAHIVP, CPP Infectious Disease Pharmacist Pager: [phone] 03/24/2018 4:17 PM

## 2018-03-24 NOTE — Clinical Social Work Note (Signed)
Clinical Social Work Assessment  Patient Details  Name: [name] MRN: [id number] Date of Birth: 15-Apr-1941  Date of referral:  03/24/18               Reason for consult:  Facility Placement, Discharge Planning                Permission sought to share information with:  Family Supports Permission granted to share information::     Name::     [name]  Agency::     Relationship::  spouse  Contact Information:  [phone]  Housing/Transportation Living arrangements for the past 2 months:  Single Family Home Source of Information:  Spouse Patient Interpreter Needed:  None Criminal Activity/Legal Involvement Pertinent to Current Situation/Hospitalization:  No - Comment as needed Significant Relationships:  Spouse, Adult Children Lives with:  Spouse Do you feel safe going back to the place where you live?  Yes Need for family participation in patient care:  Yes (Comment)  Care giving concerns: Patient from home with family. Has had recent SNF stay at [facility]. PT recommending SNF again. Palliative also consulting.   Social Worker assessment / plan: CSW spoke to patient's wife, [name], on the phone. CSW introduced self and role and discussed disposition planning. Patient's wife reported that patient was last discharged from [facility] on 12/30/17 and went home with home health.  CSW also assessed family's understanding of palliative meeting and wishes for involvement with palliative care going forward. Wife indicated that she hadn't realized patient was doing "this bad" and stated they are leaning towards taking patient home with hospice or palliative services. Wife hopeful to see patient's progress over the next few days before making a final decision.  CSW inquired if patient and family are at all interested in SNF for rehab again, and if CSW should send out SNF referrals. Wife declined to have referrals sent out, indicating she does not think that patient will be  going to rehab again.   CSW to follow for disposition planning pending patient's progress. If plan changes to SNF, patient will require fax out and [facility] authorization prior to admitting to a facility.    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  Patient/Family's Understanding of and Emotional Response to Diagnosis, Current Treatment, and Prognosis: Wife with understanding of patient's condition.   Emotional Assessment Appearance:  Appears stated age Attitude/Demeanor/Rapport:  Unable to Assess Affect (typically observed):  Unable to Assess Orientation:  Oriented to Self, Oriented to Place, Oriented to  Time, Oriented to Situation Alcohol / Substance use:  Not Applicable Psych involvement (Current and /or in the community):  No (Comment)  Discharge Needs  Concerns to be addressed:  Discharge Planning Concerns, Care Coordination Readmission within the last 30 days:  No Current discharge risk:  Physical Impairment Barriers to Discharge:  Continued Medical Work up   Anthony Emms, LCSW 03/24/2018, 5:10 PM

## 2018-03-24 NOTE — Progress Notes (Signed)
[facility] Progress Note    Assessment/ Plan:   77 y.o. male systolic CHF, aflutter, recent GIB, CASHD s/p CABG, DM, HTN, PAD, CKDIIIB/IV with recent GIB here w/ weakness and dyspnea found to be in HF and a acute on chronic renal failure w/ BUN/Cr of 75/2.51. Also found to be in afib RVR, started on high dose IV Lasix and CV on 10/29 with conversion to NSR. Also bacteremic with E. Coli and Klebsiella Pneumoniae tx w/ Cefepime and then changed to Rocephin.   1. AKI on advanced CKD IIIB/IV with poor reserve and now in cardiorenal syndrome +/- superimposed ATN. I explained to the spouse and daughter who were bedside that I do offer dialysis if it's going to improve QOL or if it's a temporizing measure until a reversible cause can be adequately treated. I do not feel that Mr. [name] falls in this category with his extensive medical history including compromised systolic cardiac function with an EF of 30%, CASHD, PAD.  - Bladder scan 10/30 only 133ml - I'm not sure he would even tolerate dialysis and he would be high risk for not regaining renal function and becoming dialysis dependent. He is certainly not a candidate for long term dialysis.  The family agrees that we should not use such aggressive measures. - Agree with a palliative consult which the family concurred with.  - Certainly possible that with a good response to tx for bacteremia + conversion to NSR there may be improvement in renal function. He's even more confused today.  2. Hyponatremia - hypervolemic variety secondary to CHF. Unfortunately won't improve till cardiac function improves. 3. CHF - EF 30% unfortunately not responding to high dose Lasix with a turn for the worse as well. Currently on Lasix 160mg  BID; per I&O + weights there doesn't appear to be much progress but condom cath was off at one point. 4. Bacteremia - E.Coli + Kleb PNA - Cefepime -> Rocephin 5. CASHD 6. PAD  Subjective:   Spouse and daughter are  bedside.  He's more confused today.   Objective:   BP (!) 142/57 (BP Location: Right Arm)   Pulse (!) 52   Temp 97.6 F (36.4 C) (Oral)   Resp 16   Wt 115.8 kg   SpO2 100%   BMI 35.59 kg/m   Intake/Output Summary (Last 24 hours) at 03/24/2018 1138 Last data filed at 03/24/2018 5427 Gross per 24 hour  Intake 116 ml  Output 1175 ml  Net -1059 ml   Weight change: 0.227 kg  Physical Exam: General appearance: slowed mentation Head: NCAT Resp: poor air movement + effort Chest wall: no tenderness Cardio: RRR, no longer tachy GI: soft, non-tender; bowel sounds normal; no masses,  no organomegaly Extremities: edema 2+ Neurologic: Confused  Imaging: No results found.  Labs: BMET Recent Labs  Lab 03/17/18 1627 03/18/18 1325 03/18/18 2158 03/19/18 0540 03/20/18 0537 03/21/18 0509 03/22/18 0320 03/23/18 0417  NA 132* 130*  --  131* 132* 131* 126* 126*  K 3.2* 2.4* 2.8* 2.6* 3.2* 4.2 4.7 4.7  CL 89* 92*  --  89* 88* 92* 87* 89*  CO2 26 24  --  28 29 27 25 25   GLUCOSE 267* 224*  --  213* 173* 193* 211* 171*  BUN 67* 75*  --  73* 88* 97* 106* 120*  CREATININE 2.40* 2.51*  --  2.31* 2.72* 2.84* 2.92* 3.33*  CALCIUM 8.7 8.2*  --  8.4* 8.6* 8.3* 8.4* 8.2*   CBC Recent  Labs  Lab 03/18/18 1325  03/21/18 0509 03/22/18 0320 03/23/18 0417 03/24/18 1019  WBC 8.6   < > 9.2 10.8* 10.0 10.6*  NEUTROABS 7.5  --   --   --   --  9.0*  HGB 8.8*   < > 9.5* 10.0* 10.0* 10.3*  HCT 27.7*   < > 30.9* 31.1* 31.1* 32.4*  MCV 88.8   < > 88.3 86.9 85.9 86.2  PLT 235   < > 257 281 316 331   < > = values in this interval not displayed.    Medications:    . amiodarone  200 mg Oral BID  . atorvastatin  20 mg Oral QPC supper  . carvedilol  3.125 mg Oral BID  . fenofibrate  54 mg Oral Daily  . gabapentin  100 mg Oral BID PC  . insulin aspart  0-20 Units Subcutaneous TID WC  . insulin aspart  0-5 Units Subcutaneous QHS  . insulin glargine  8 Units Subcutaneous QHS  .  levothyroxine  100 mcg Oral QAC breakfast  . pantoprazole  40 mg Oral Daily  . tamsulosin  0.4 mg Oral Q M,W,F      Otelia Santee, MD 03/24/2018, 11:38 AM

## 2018-03-25 DIAGNOSIS — Z7189 Other specified counseling: Secondary | ICD-10-CM

## 2018-03-25 DIAGNOSIS — Z515 Encounter for palliative care: Secondary | ICD-10-CM

## 2018-03-25 DIAGNOSIS — E871 Hypo-osmolality and hyponatremia: Secondary | ICD-10-CM

## 2018-03-25 DIAGNOSIS — R404 Transient alteration of awareness: Secondary | ICD-10-CM

## 2018-03-25 LAB — RENAL FUNCTION PANEL
Albumin: 2 g/dL — ABNORMAL LOW (ref 3.5–5.0)
Anion gap: 13 (ref 5–15)
BUN: 122 mg/dL — ABNORMAL HIGH (ref 8–23)
CO2: 27 mmol/L (ref 22–32)
Calcium: 8.4 mg/dL — ABNORMAL LOW (ref 8.9–10.3)
Chloride: 89 mmol/L — ABNORMAL LOW (ref 98–111)
Creatinine, Ser: 2.83 mg/dL — ABNORMAL HIGH (ref 0.61–1.24)
GFR calc Af Amer: 23 mL/min — ABNORMAL LOW (ref >60)
GFR calc non Af Amer: 20 mL/min — ABNORMAL LOW (ref >60)
Glucose, Bld: 210 mg/dL — ABNORMAL HIGH (ref 70–99)
Phosphorus: 4.7 mg/dL — ABNORMAL HIGH (ref 2.5–4.6)
Potassium: 4.4 mmol/L (ref 3.5–5.1)
Sodium: 129 mmol/L — ABNORMAL LOW (ref 135–145)

## 2018-03-25 LAB — CBC WITH DIFFERENTIAL/PLATELET
Abs Immature Granulocytes: 0.18 10*3/uL — ABNORMAL HIGH (ref 0.00–0.07)
Basophils Absolute: 0 10*3/uL (ref 0.0–0.1)
Basophils Relative: 0 %
Eosinophils Absolute: 0.2 10*3/uL (ref 0.0–0.5)
Eosinophils Relative: 2 %
HCT: 31 % — ABNORMAL LOW (ref 39.0–52.0)
Hemoglobin: 9.7 g/dL — ABNORMAL LOW (ref 13.0–17.0)
Immature Granulocytes: 2 %
Lymphocytes Relative: 7 %
Lymphs Abs: 0.6 10*3/uL — ABNORMAL LOW (ref 0.7–4.0)
MCH: 27.2 pg (ref 26.0–34.0)
MCHC: 31.3 g/dL (ref 30.0–36.0)
MCV: 86.8 fL (ref 80.0–100.0)
Monocytes Absolute: 0.5 10*3/uL (ref 0.1–1.0)
Monocytes Relative: 5 %
Neutro Abs: 7.1 10*3/uL (ref 1.7–7.7)
Neutrophils Relative %: 84 %
Platelets: 367 10*3/uL (ref 150–400)
RBC: 3.57 MIL/uL — ABNORMAL LOW (ref 4.22–5.81)
RDW: 16.5 % — ABNORMAL HIGH (ref 11.5–15.5)
WBC: 8.5 10*3/uL (ref 4.0–10.5)
nRBC: 0.2 % (ref 0.0–0.2)

## 2018-03-25 LAB — GLUCOSE, CAPILLARY
GLUCOSE-CAPILLARY: 181 mg/dL — AB (ref 70–99)
GLUCOSE-CAPILLARY: 151 mg/dL — AB (ref 70–99)
Glucose-Capillary: 199 mg/dL — ABNORMAL HIGH (ref 70–99)
Glucose-Capillary: 199 mg/dL — ABNORMAL HIGH (ref 70–99)

## 2018-03-25 LAB — CULTURE, BLOOD (ROUTINE X 2): SPECIAL REQUESTS: ADEQUATE

## 2018-03-25 LAB — MAGNESIUM: Magnesium: 2.5 mg/dL — ABNORMAL HIGH (ref 1.7–2.4)

## 2018-03-25 NOTE — Care Management Important Message (Signed)
Important Message  Patient Details  Name: [name] MRN: [id number] Date of Birth: Mar 20, 1941   Medicare Important Message Given:  Yes    Bethena Roys, RN 03/25/2018, 11:59 AM

## 2018-03-25 NOTE — Progress Notes (Signed)
[facility] TEAM 1 - Stepdown/ICU TEAM  [name]  GQQ:[id number] DOB: 04-11-1941 DOA: 03/18/2018 PCP: Lavone Orn, MD    Brief Narrative:  77 y.o. male with a hx of systolic heart failure, atrial flutter, recent GI bleed, CAD s/p CABG, hypothyroidism, DM, GERD, HTN, HLD, stage III/stage IV CKD, and PAD who was recently discharged from the hospital after a GI bleed who presented w/ generalized weakness, and inability to ambulate, and shortness of breath.    On presentation to the hospital, patient was noted to be in atrial fibrillation with rapid ventricular response, acute kidney injury on chronic kidney disease stage III/IV, acute on chronic combined systolic and diastolic congestive heart failure with recent echocardiogram revealing EF of 30 to 67%, grade 2 diastolic dysfunction and decreased right ventricular systolic function.  UA is suggestive of likely UTI and blood culture is growing Klebsiella pneumonia, suspect likely secondary to UTI/sepsis.  Worsening mental status is noted.  Sodium is 126.  Patient remains volume overloaded, despite being on high-dose Lasix.  03/23/2018: BUN is 120 and serum creatinine is 3.3, with baseline serum creatinine of 1.8 to 2.  Albumin is 1.9, AST of 394, ALT of 139.  Patient was seen alongside patient's nurse and wife.  Updated patient's wife extensively.  Patient could not participate reasonably with a consultation due to altered mentation, possibly secondary to uremia.  To date, no GI bleed noted.  Nephrology and cardiology team consulted.  Guarded/poor prognosis, communicated to the patient's wife.  Will consult palliative care service.  Further management will depend on hospital course.  03/24/2018: Patient seen alongside patient's wife, daughter, son and daughter-in-law.  Blood culture final result grew Klebsiella ESBL.  We change antibiotics from IV Rocephin to meropenem.  Patient's family is expecting Dr. Tamala Julian, patient's cardiologist to have an  input in goal of care discussion.  For now, will continue to treat patient.  Cardiology and nephrology input is highly appreciated.  Awaiting stool fecal occult blood to rule out occult GI bleed.  11//1/19: Patient actually looks much better today.  Patient is more awake.  Patient is more communicative.  Leg edema is improving.  Improvement in serum creatinine.  BUN remains at 122.  Stool for fecal occult blood when feasible.  Continue current management.  Continue IV meropenem as blood culture grew Klebsiella ESBL.  Subjective: Patient is more awake today and communicative today. Still not able to give a coherent history. Tells me that he wants to be "alive".  Assessment & Plan: Acute exacerbation of chronic combined systolic and diastolic CHF: -Consult heart failure team. -Query diuresis resistant. -BUN of 120, serum creatinine of 3.3, and with likely uremia - query role of hemodialysis, but patient may not be a good candidate due to multiple cold morbidities. -Latest echo revealed EF of 30 to 35% (kindly see above). - Await further recommendation from the heart failure team. - Nephrology team also consulted.  -03/25/18: Continue current regimen.  Continue to monitor renal function and electrolytes.  Leg edema is improving.  Patient actually looks much better today.  Atrial flutter/Atrial Fib with rapid ventricular response: Cardiology is directing care  Eliquis was held during recent admit for GIB, but resumed at time of d/c  S/P TEE DCCV  UTI/bacteremia/sepsis, likely secondary to Klebsiella: 03/24/2018: Blood culture final result revealed Klebsiella pneumonia, ESBL. Discontinued IV Rocephin. Continue IV meropenem.  Hypoalbuminemia:  Indicate poor prognosis.    Acute kidney injury on chronic kidney disease stage III/IV:  Nephrology input is  appreciated.  As per nephrology team, not ideal candidate for dialysis. We will continue to assess. 03/25/2018: Serum creatinine is trending  downwards.  Overall, long-term prognosis remains guarded.  Recent Labs  Lab 03/21/18 0509 03/22/18 0320 03/23/18 0417 03/24/18 1019 03/25/18 1049  CREATININE 2.84* 2.92* 3.33* 3.17* 2.83*     Hypokalemia: corrected   Hyponatremia: Due to CHF May have prognostic significance.   DM2: Continue to optimize.  HLD  Anemia of chronic kidney disease and recent GIB Follow trend - Hgb stable    GIB Oct 2019 EGD 03/10/18 w/o clear source of bleeding   CAD s/p CABG Has suspected graft failure - not a candidate for cardiac cath given kidney disease   Cystic lesion of pancreas Noted on MRI abdom earlier this month - mildly elevated CA 19-9 not felt to be worrisome per GI   Consulted palliative team. Patient and family still want patient to be actively treated.   DVT prophylaxis: eliquis  Code Status: FULL CODE Family Communication: spoke wife.    Disposition Plan: Will depend on hospital course.    Consultants:  [facility] Cardiology Heart failure team. Nephrology.  Antimicrobials:  IV Rocephin discontinued on 03/24/2018. IV meropenem  Objective: Blood pressure (!) 129/49, pulse (!) 58, temperature (!) 97.5 F (36.4 C), temperature source Oral, resp. rate 16, weight 114.6 kg, SpO2 94 %.  Intake/Output Summary (Last 24 hours) at 03/25/2018 1331 Last data filed at 03/25/2018 0436 Gross per 24 hour  Intake 720 ml  Output 1200 ml  Net -480 ml   Filed Weights   03/23/18 0528 03/24/18 0457 03/25/18 0428  Weight: 115.5 kg 115.8 kg 114.6 kg    Examination: General: More awake and alert.    Lungs: Decreased air entry. Cardiovascular: S1-S2. Abdomen: Nontender, obese, soft, BS+, no rebound Extremities: 3+ B LE edema   CBC: Recent Labs  Lab 03/23/18 0417 03/24/18 1019 03/25/18 1049  WBC 10.0 10.6* 8.5  NEUTROABS  --  9.0* 7.1  HGB 10.0* 10.3* 9.7*  HCT 31.1* 32.4* 31.0*  MCV 85.9 86.2 86.8  PLT 316 331 854   Basic Metabolic Panel: Recent Labs  Lab  03/20/18 0537  03/23/18 0417 03/24/18 1019 03/25/18 1049  NA 132*   < > 126* 129* 129*  K 3.2*   < > 4.7 3.5 4.4  CL 88*   < > 89* 90* 89*  CO2 29   < > 25 26 27   GLUCOSE 173*   < > 171* 128* 210*  BUN 88*   < > 120* 124* 122*  CREATININE 2.72*   < > 3.33* 3.17* 2.83*  CALCIUM 8.6*   < > 8.2* 8.1* 8.4*  MG 2.3  --   --  2.2 2.5*  PHOS  --   --   --   --  4.7*   < > = values in this interval not displayed.   GFR: Estimated Creatinine Clearance: 27.7 mL/min (A) (by C-G formula based on SCr of 2.83 mg/dL (H)).  Liver Function Tests: Recent Labs  Lab 03/20/18 0537 03/21/18 0509 03/23/18 0417 03/24/18 1019 03/25/18 1049  AST 49* 50* 394* 168*  --   ALT 22 29 139* 119*  --   ALKPHOS 40 39 41 43  --   BILITOT 1.2 1.1 1.0 0.9  --   PROT 5.7* 5.8* 5.5* 5.4*  --   ALBUMIN 2.2* 2.2* 1.9* 2.0* 2.0*    Cardiac Enzymes: No results for input(s): CKTOTAL, CKMB, CKMBINDEX, TROPONINI in the last  168 hours.  HbA1C: Hgb A1c MFr Bld  Date/Time Value Ref Range Status  10/21/2017 12:33 PM 6.2 (H) 4.8 - 5.6 % Final    Comment:    (NOTE) Pre diabetes:          5.7%-6.4% Diabetes:              >6.4% Glycemic control for   <7.0% adults with diabetes   09/24/2011 05:50 AM 6.4 (H) <5.7 % Final    Comment:    (NOTE)                                                                       According to the ADA Clinical Practice Recommendations for 2011, when HbA1c is used as a screening test:  >=6.5%   Diagnostic of Diabetes Mellitus           (if abnormal result is confirmed) 5.7-6.4%   Increased risk of developing Diabetes Mellitus References:Diagnosis and Classification of Diabetes Mellitus,Diabetes ZTIW,5809,98(PJASN 1):S62-S69 and Standards of Medical Care in         Diabetes - 2011,Diabetes KNLZ,7673,41 (Suppl 1):S11-S61.    CBG: Recent Labs  Lab 03/24/18 1130 03/24/18 1634 03/24/18 2141 03/25/18 0755 03/25/18 1144  GLUCAP 198* 198* 190* 181* 199*    Scheduled Meds: .  amiodarone  200 mg Oral BID  . atorvastatin  20 mg Oral QPC supper  . carvedilol  3.125 mg Oral BID  . fenofibrate  54 mg Oral Daily  . gabapentin  100 mg Oral BID PC  . insulin aspart  0-20 Units Subcutaneous TID WC  . insulin aspart  0-5 Units Subcutaneous QHS  . insulin glargine  8 Units Subcutaneous QHS  . levothyroxine  100 mcg Oral QAC breakfast  . pantoprazole  40 mg Oral Daily  . tamsulosin  0.4 mg Oral Q M,W,F   Continuous Infusions: . furosemide 160 mg (03/25/18 0908)  . meropenem (MERREM) IV 1 g (03/25/18 0538)     LOS: 7 days   Bonnell Public, MD [facility] Office  [phone] Pager - Text Page per Amion  If 7PM-7AM, please contact night-coverage per Amion 03/25/2018, 1:31 PM

## 2018-03-25 NOTE — Progress Notes (Signed)
[facility] Progress Note    Assessment/ Plan:   77 y.o.malesystolic CHF, aflutter, recent GIB, CASHD s/p CABG, DM, HTN, PAD, CKDIIIB/IV with recent GIB here w/ weakness and dyspnea found to be in HF and a acute on chronic renal failure w/ BUN/Cr of 75/2.51. Also found to be in afib RVR, started on high dose IV Lasix and CV on 10/29 with conversion to NSR. Also bacteremic with E. Coli and Klebsiella Pneumoniae tx w/ Cefepime and then changed to Rocephin.   1. AKI on advanced CKD IIIB/IV with poor reserve and now in cardiorenal syndrome +/- superimposed ATN. I explained to the spouse and daughter who were bedside that I do offer dialysis if it's going to improve QOL or if it's a temporizing measure until a reversible cause can be adequately treated. I do not feel that [name] falls in this category with his extensive medical history including compromised systolic cardiac function with an EF of 30%, CASHD, PAD.  - Bladder scan 10/30 only 178ml - I'm not sure he would even tolerate dialysis and he would be high risk for not regaining renal function and becoming dialysis dependent. He is certainly not a candidate for long term dialysis.  The family agrees that we should not use such aggressive measures.  - Agree with a palliative consult which the family concurred with; wife was bedside. She was still wondering if he would ever be a candidate for dialysis. I counseled her and educated; he will not be a candidate for dialysis.  - Certainly possible that with a good response to tx for bacteremia + conversion to NSR there may be improvement in renal function. He's even more confused today; metab panel pending but it was improved yest w/ decent UOP  2. Hyponatremia - hypervolemic variety secondary to CHF. Unfortunately won't improve till cardiac function improves. 3. CHF - EF 30% unfortunately not responding to high dose Lasix with a turn for the worse as well. Currently on Lasix  160mg  BID; per I&O + weights. 4. Bacteremia - E.Coli + Kleb PNA - Cefepime -> Rocephin 5. CASHD 6. PAD  Subjective:   Spouse  Bedside today.  He's more confused today but is arousable. Lethargic   Objective:   BP (!) 129/49 (BP Location: Right Arm)   Pulse (!) 58   Temp (!) 97.5 F (36.4 C) (Oral)   Resp 16   Wt 114.6 kg   SpO2 94%   BMI 35.24 kg/m   Intake/Output Summary (Last 24 hours) at 03/25/2018 1212 Last data filed at 03/25/2018 0436 Gross per 24 hour  Intake 960 ml  Output 1200 ml  Net -240 ml   Weight change: -1.158 kg  Physical Exam: General appearance:slowed mentation (possibly even worse today), arousable but confused Head:NCAT Resp:poor air movement + effort Chest wall:no tenderness Cardio:RRR, no longer tachy HY:QMVH, non-tender; bowel sounds normal; no masses, no organomegaly Extremities:edema2+ Neurologic:Confused  Imaging: No results found.  Labs: BMET Recent Labs  Lab 03/18/18 1325 03/18/18 2158 03/19/18 0540 03/20/18 0537 03/21/18 0509 03/22/18 0320 03/23/18 0417 03/24/18 1019  NA 130*  --  131* 132* 131* 126* 126* 129*  K 2.4* 2.8* 2.6* 3.2* 4.2 4.7 4.7 3.5  CL 92*  --  89* 88* 92* 87* 89* 90*  CO2 24  --  28 29 27 25 25 26   GLUCOSE 224*  --  213* 173* 193* 211* 171* 128*  BUN 75*  --  73* 88* 97* 106* 120* 124*  CREATININE 2.51*  --  2.31* 2.72* 2.84* 2.92* 3.33* 3.17*  CALCIUM 8.2*  --  8.4* 8.6* 8.3* 8.4* 8.2* 8.1*   CBC Recent Labs  Lab 03/18/18 1325  03/22/18 0320 03/23/18 0417 03/24/18 1019 03/25/18 1049  WBC 8.6   < > 10.8* 10.0 10.6* 8.5  NEUTROABS 7.5  --   --   --  9.0* 7.1  HGB 8.8*   < > 10.0* 10.0* 10.3* 9.7*  HCT 27.7*   < > 31.1* 31.1* 32.4* 31.0*  MCV 88.8   < > 86.9 85.9 86.2 86.8  PLT 235   < > 281 316 331 367   < > = values in this interval not displayed.    Medications:    . amiodarone  200 mg Oral BID  . atorvastatin  20 mg Oral QPC supper  . carvedilol  3.125 mg Oral BID  .  fenofibrate  54 mg Oral Daily  . gabapentin  100 mg Oral BID PC  . insulin aspart  0-20 Units Subcutaneous TID WC  . insulin aspart  0-5 Units Subcutaneous QHS  . insulin glargine  8 Units Subcutaneous QHS  . levothyroxine  100 mcg Oral QAC breakfast  . pantoprazole  40 mg Oral Daily  . tamsulosin  0.4 mg Oral Q M,W,F      Otelia Santee, MD 03/25/2018, 12:12 PM

## 2018-03-25 NOTE — Care Management Note (Addendum)
Case Management Note  Patient Details  Name: [name] MRN: [id number] Date of Birth: Oct 11, 1940  Subjective/Objective:  Pt presented for Acute on Chronic Heart Failure. PTA From home with support of wife. Plan for transition home once stable with [facility] RN, PT, SW, Aide- pt will need resumption orders and F2F.                   Action/Plan: CM spoke with patient and wife [name] @ the bedside. Wife is still working however thinking she may need to stop working to assist with care of husband.  PTA they were utilizing [facility] for aide services paying out of pocket. Alvis Lemmings was providing the skilled services. Palliative has consulted and representative from [facility] per wife came by to speak with family. Family is declining palliative and hospice at this time. CM did make [facility] aware with Alvis Lemmings that patient is hospitalized. SOC to begin within 24-48 hours post transition home. CM will continue to follow for DME.   Expected Discharge Date:                  Expected Discharge Plan:  [place]  In-House Referral:  Clinical Social Work  Discharge planning Services  CM Consult  Post Acute Care Choice:  Home Health, Resumption of Svcs/PTA Provider Choice offered to:  Patient, Spouse  DME Arranged:   Art therapist DME Agency:   [facility]  HH Arranged: Registered Nurse, Aide  [facility] Agency:  [facility]  Status of Service:  Completed If discussed at Long Length of Stay Meetings, dates discussed:    Additional Comments: 1308 04-06-18 Jacqlyn Krauss, RN,BSN [phone] CM did speak with wife and patient. DME will be delivered between 4-8 pm. Family is ready to leave and wants to leave without DME being delivered. Wife states patient has a recliner that he can sit in until the bed arrives. Wife states that patient will be fine without 02 until DME is needed-she is not sure that he needs it. Patient will travel via  private vehicle. No further needs from CM at this time.     1205 04-06-18 Jacqlyn Krauss, RN,BSN [phone] CM did speak with patient and wife in regards to disposition needs. DME has not been delivered and trying to work with Hospice in regards to delivery times. Patient will travel home via private vehicle. No further needs at this time.    1510 04-05-18 Jacqlyn Krauss, RN,BSN Case Manager [phone] CM did speak with family in regards to disposition-plan is still for home with Hospice Services. Referral has been made with [facility] and [place]. DME needs for Hospital Bed, Over-Bed table, WC-Bariatric and Oxygen will be needed for the home. DME will need to be in the home prior to transition home. Case Manager to assess on Wed the need for ambulance transport vs car home. CM did reach out to daughter Abby to make her aware of possible transition home for tomorrow.     1633 04-01-18 Jacqlyn Krauss, RN,BSN [phone] CM was able to meet with patient, wife an daughter Maretta Bees [phone]. Family now wants patient to go home with Hospice Services and patient is agreeable. Family chose [facility] [place]. Referral made to [facility]. [name] is aware that patient wants DME Bariatric Wheelchair, Hospital Bed and Over-bed table. Family had questions in regards to receiving PT in the Home with the Hospice Benefit. CM did relay this to [employer]  and she will discuss with family. CM did cancel services with [facility]. Heart Failure following- family is leaning more towards Tuesday for transition home. CM will continue to monitor for additional needs.     1407 04-01-18 Jacqlyn Krauss, RN,BSN [phone] CM did speak with wife/patient this am along with MD- plan is still for home with [facility]. Alvis Lemmings can start care over the weekend if atient transitions over the weekend. Daughter to visit this evening and will continue to monitor  plan of care. Patient's wife will have to travel to [facility] to pick up the wheelchair. CM will continue to monitor. Pt continues on IV Lasix will transition to torsemide on 04-02-18. Will continue to follow for additional needs.     1119 03-31-18 Jacqlyn Krauss, RN,BSN [phone] CM did speak with patient/wife again in regards to disposition plan. Wife unclear of plan at this time. CM did discuss several options of home with home health services, home with palliative services, Hospice in the Home and SNF option. Patient has been at [facility] and he did not receive good Physical Therapy. Wife states she will need to speak with daughter about plan of care and will call Case Manager today with plan of care. CM will continue to monitor.  Bethena Roys, RN 03/25/2018, 1:15 PM

## 2018-03-25 NOTE — Progress Notes (Signed)
Chaplain responded to spiritual care consult for Advanced Directive and possible Palliative Care.  Pt was alert with some confusion as well.  Stated he already had named a health care agent.  Pt wished to talk more with chaplain but doctor entered room for exam. Will refer to to unit chaplain and palliative care chaplain also.  Tamsen Snider  pager [phone]

## 2018-03-25 NOTE — Progress Notes (Signed)
CSW noted plan is for patient to return home with home health services. RNCM following to support with discharge home. CSW will sign off, as no additional needs identified at this time.  [name], [name]

## 2018-03-25 NOTE — Progress Notes (Signed)
Physical Therapy Treatment Patient Details Name: [name] MRN: [id number] DOB: [birth year] Today's Date: 03/25/2018    History of Present Illness 77 year old man was admitted for acute CHF exacerbation.  He was recently admitted with GIB.  PMH:  fem/pop 6/19, cellulitis, CAD, s/p CABG, chronic diastolic heart failure, PAD, HTN, CKD    PT Comments    Pt admitted with above diagnosis. Pt currently with functional limitations due to the deficits listed below (see PT Problem List). Pt was able to stand and pivot to get to recliner from bed with use of RW and min to mod assist of 2  and cues with RW. Still will need SNF or if home, max HH and hoyer lift most likely.  Pt very difficult to motivate and to move pt.   Pt will benefit from skilled PT to increase their independence and safety with mobility to allow discharge to the venue listed below.     Follow Up Recommendations  SNF;Supervision/Assistance - 24 hour(HHPT,HHOT, HHAide if family refuses SNF)     Equipment Recommendations  Other (comment)(may need hoyer lift if they take pt home)    Recommendations for Other Services       Precautions / Restrictions Precautions Precautions: Fall Required Braces or Orthoses: (per notes L AFO) Restrictions Weight Bearing Restrictions: No    Mobility  Bed Mobility   Bed Mobility: Rolling;Sidelying to Sit Rolling: +2 for physical assistance;Max assist Sidelying to sit: Max assist;+2 for physical assistance Supine to sit: Max assist;+2 for physical assistance     General bed mobility comments: Pt needed max assist to roll to right and max assist to bring LEs off bed and for elevation of trunk  Transfers Overall transfer level: Needs assistance Equipment used: Rolling walker (2 wheeled) Transfers: Sit to/from [facility] Sit to Stand: Mod assist;+2 physical assistance Stand pivot transfers: Min to mod assist with cues ;+2 physical assistance       General  transfer comment: mod assist to power up and incr time to stand all the way up.   min assist to stand and pivot to chair with assist to manuever RW and cues for step sequencing.   Ambulation/Gait                 Stairs             Wheelchair Mobility    Modified Rankin (Stroke Patients Only)       Balance Overall balance assessment: Needs assistance Sitting-balance support: Bilateral upper extremity supported;Feet supported Sitting balance-Leahy Scale: Poor Sitting balance - Comments: Progressed to sitting without UE support with min guard assist but at times needing min assist for balance.  Sat EOB 8 min performing reaching and LE exercises.    Standing balance support: During functional activity Standing balance-Leahy Scale: Poor Standing balance comment: relies on UE support and external support for balance                            Cognition Arousal/Alertness: Awake/alert Behavior During Therapy: WFL for tasks assessed/performed Overall Cognitive Status: Within Functional Limits for tasks assessed                                 General Comments: followed commands with incr time      Exercises General Exercises - Lower Extremity Ankle Circles/Pumps: AROM;Both;5 reps;Supine Long Arc Quad: AROM;Both;5  reps;Seated    General Comments General comments (skin integrity, edema, etc.): VSS      Pertinent Vitals/Pain Pain Assessment: Faces Faces Pain Scale: Hurts little more Pain Location: arms and legs with movement Pain Descriptors / Indicators: Tightness;Discomfort;Tender Pain Intervention(s): Monitored during session;Limited activity within patient's tolerance;Repositioned    Home Living                      Prior Function            PT Goals (current goals can now be found in the care plan section) Acute Rehab PT Goals Patient Stated Goal: get stronger Potential to Achieve Goals: Good Progress towards PT  goals: Progressing toward goals    Frequency    Min 3X/week      PT Plan Current plan remains appropriate    Co-evaluation              AM-PAC PT "6 Clicks" Daily Activity  Outcome Measure  Difficulty turning over in bed (including adjusting bedclothes, sheets and blankets)?: Unable Difficulty moving from lying on back to sitting on the side of the bed? : Unable Difficulty sitting down on and standing up from a chair with arms (e.g., wheelchair, bedside commode, etc,.)?: Unable Help needed moving to and from a bed to chair (including a wheelchair)?: Total Help needed walking in hospital room?: Total Help needed climbing 3-5 steps with a railing? : Total 6 Click Score: 6    End of Session Equipment Utilized During Treatment: Gait belt;Oxygen Activity Tolerance: Patient limited by fatigue Patient left: with call bell/phone within reach;in chair;with chair alarm set Nurse Communication: Mobility status;Need for lift equipment PT Visit Diagnosis: Unsteadiness on feet (R26.81);Other abnormalities of gait and mobility (R26.89);Muscle weakness (generalized) (M62.81);Repeated falls (R29.6);Difficulty in walking, not elsewhere classified (R26.2)     Time: 1000-1026 PT Time Calculation (min) (ACUTE ONLY): 26 min  Charges:  $Therapeutic Exercise: 8-22 mins $Therapeutic Activity: 8-22 mins                     [place] Pager:  [phone]  Office:  [place] 03/25/2018, 12:50 PM

## 2018-03-26 DIAGNOSIS — E8779 Other fluid overload: Secondary | ICD-10-CM

## 2018-03-26 LAB — RENAL FUNCTION PANEL
Albumin: 2 g/dL — ABNORMAL LOW (ref 3.5–5.0)
Albumin: 2.2 g/dL — ABNORMAL LOW (ref 3.5–5.0)
Anion gap: 10 (ref 5–15)
Anion gap: 13 (ref 5–15)
BUN: 107 mg/dL — ABNORMAL HIGH (ref 8–23)
BUN: 105 mg/dL — ABNORMAL HIGH (ref 8–23)
CO2: 32 mmol/L (ref 22–32)
CO2: 30 mmol/L (ref 22–32)
Calcium: 8.4 mg/dL — ABNORMAL LOW (ref 8.9–10.3)
Calcium: 8.4 mg/dL — ABNORMAL LOW (ref 8.9–10.3)
Chloride: 91 mmol/L — ABNORMAL LOW (ref 98–111)
Chloride: 88 mmol/L — ABNORMAL LOW (ref 98–111)
Creatinine, Ser: 2.47 mg/dL — ABNORMAL HIGH (ref 0.61–1.24)
Creatinine, Ser: 2.65 mg/dL — ABNORMAL HIGH (ref 0.61–1.24)
GFR calc Af Amer: 27 mL/min — ABNORMAL LOW (ref >60)
GFR calc Af Amer: 25 mL/min — ABNORMAL LOW (ref >60)
GFR calc non Af Amer: 23 mL/min — ABNORMAL LOW (ref >60)
GFR calc non Af Amer: 22 mL/min — ABNORMAL LOW (ref >60)
Glucose, Bld: 167 mg/dL — ABNORMAL HIGH (ref 70–99)
Glucose, Bld: 208 mg/dL — ABNORMAL HIGH (ref 70–99)
Phosphorus: 4.1 mg/dL (ref 2.5–4.6)
Phosphorus: 4.1 mg/dL (ref 2.5–4.6)
Potassium: 2.6 mmol/L — CL (ref 3.5–5.1)
Potassium: 3.2 mmol/L — ABNORMAL LOW (ref 3.5–5.1)
Sodium: 133 mmol/L — ABNORMAL LOW (ref 135–145)
Sodium: 131 mmol/L — ABNORMAL LOW (ref 135–145)

## 2018-03-26 LAB — GLUCOSE, CAPILLARY
GLUCOSE-CAPILLARY: 154 mg/dL — AB (ref 70–99)
GLUCOSE-CAPILLARY: 210 mg/dL — AB (ref 70–99)
Glucose-Capillary: 127 mg/dL — ABNORMAL HIGH (ref 70–99)
Glucose-Capillary: 143 mg/dL — ABNORMAL HIGH (ref 70–99)

## 2018-03-26 LAB — MAGNESIUM: Magnesium: 2.4 mg/dL (ref 1.7–2.4)

## 2018-03-26 MED ORDER — POTASSIUM CHLORIDE CRYS ER 20 MEQ PO TBCR
40.0000 meq | EXTENDED_RELEASE_TABLET | Freq: Once | ORAL | Status: AC
  Administered 2018-03-26: 40 meq via ORAL
  Filled 2018-03-26: qty 2

## 2018-03-26 NOTE — Progress Notes (Signed)
[facility] Progress Note    Assessment/ Plan:   77 y.o.malesystolic CHF, aflutter, recent GIB, CASHD s/p CABG, DM, HTN, PAD, CKDIIIB/IVwith recentGIBhere w/weakness and dyspneafound to be in HF and aacute on chronic renal failure w/BUN/Cr of 75/2.51.Also found to be in afib RVR, started on high dose IV Lasix and CV on 10/29 with conversion to NSR.Alsobacteremic with E. Coli and Klebsiella Pneumoniae tx w/ Cefepime and then changed to Rocephin.   1. AKI on advanced CKD IIIB/IV with poor reserve and now in cardiorenal syndrome +/- superimposed ATN. I explained to the spouse and daughter who were bedside that I do offer dialysis if it's going to improve QOL or if it's a temporizing measure until a reversible cause can be adequately treated. I do not feel that Mr. Bur falls in this category with his extensive medical history including compromised systolic cardiac function with an EF of 30%, CASHD, PAD.  -Bladder scan10/30 only 114ml - I'm not sure he would even tolerate dialysis and he would be high risk for not regaining renal function and becoming dialysis dependent. He is certainly not a candidate for long term dialysis.The family agrees that we should not use such aggressive measures.  - Agree with a palliative consult which the family concurred with.  - Certainly possible that with a good response to tx for bacteremia + conversion to NSR there may be improvement in renal function.   -  He's actually much more alert today in the chair and answering questions appropriately. Metab panel pending but it was improved yest.  - No recording of UOP yesterday but already 657ml out today?  2. Hyponatremia - hypervolemic variety secondary to CHF. Unfortunately won't improve till cardiac function improves. 3. CHF - EF 30% unfortunately not responding to high dose Lasix with a turn for the worse as well.Currently on Lasix 160mg  BID; per I&O + weights. 4. Bacteremia -  E.Coli + Kleb PNA - Cefepime -> Rocephin 5. CASHD 6. PAD  Subjective:   Still has dyspnea but no worse. Denies CP/c/n/v.  Great appetite; actually almost ate entire meal w/ no assistance.   Objective:   BP (!) 145/45 (BP Location: Right Leg)   Pulse (!) 54   Temp 98 F (36.7 C) (Oral)   Resp 16   Wt 114.6 kg   SpO2 98%   BMI 35.24 kg/m   Intake/Output Summary (Last 24 hours) at 03/26/2018 0945 Last data filed at 03/26/2018 0751 Gross per 24 hour  Intake 475.2 ml  Output 600 ml  Net -124.8 ml   Weight change:   Physical Exam: General appearance:NAD, not tachypneic Head:NCAT Resp:poor air movement + effort Chest wall:no tenderness Cardio:RRR, no longer tachy EN:IDPO, non-tender; bowel sounds normal; no masses, no organomegaly Extremities:edema2+ Neurologic:Much more alert and cooperative today  Imaging: No results found.  Labs: BMET Recent Labs  Lab 03/20/18 0537 03/21/18 0509 03/22/18 0320 03/23/18 0417 03/24/18 1019 03/25/18 1049  NA 132* 131* 126* 126* 129* 129*  K 3.2* 4.2 4.7 4.7 3.5 4.4  CL 88* 92* 87* 89* 90* 89*  CO2 29 27 25 25 26 27   GLUCOSE 173* 193* 211* 171* 128* 210*  BUN 88* 97* 106* 120* 124* 122*  CREATININE 2.72* 2.84* 2.92* 3.33* 3.17* 2.83*  CALCIUM 8.6* 8.3* 8.4* 8.2* 8.1* 8.4*  PHOS  --   --   --   --   --  4.7*   CBC Recent Labs  Lab 03/22/18 0320 03/23/18 0417 03/24/18 1019 03/25/18 1049  WBC 10.8* 10.0 10.6* 8.5  NEUTROABS  --   --  9.0* 7.1  HGB 10.0* 10.0* 10.3* 9.7*  HCT 31.1* 31.1* 32.4* 31.0*  MCV 86.9 85.9 86.2 86.8  PLT 281 316 331 367    Medications:    . amiodarone  200 mg Oral BID  . atorvastatin  20 mg Oral QPC supper  . carvedilol  3.125 mg Oral BID  . fenofibrate  54 mg Oral Daily  . gabapentin  100 mg Oral BID PC  . insulin aspart  0-20 Units Subcutaneous TID WC  . insulin aspart  0-5 Units Subcutaneous QHS  . insulin glargine  8 Units Subcutaneous QHS  . levothyroxine  100 mcg Oral QAC  breakfast  . pantoprazole  40 mg Oral Daily  . tamsulosin  0.4 mg Oral Q M,W,F      Otelia Santee, MD 03/26/2018, 9:45 AM

## 2018-03-26 NOTE — Progress Notes (Signed)
[place] TEAM 1 - Stepdown/ICU TEAM  [name]  OEV:[id number] DOB: 1941-04-27 DOA: 03/18/2018 PCP: Lavone Orn, MD    Brief Narrative:  77 y.o. male with a hx of systolic heart failure, atrial flutter, recent GI bleed, CAD s/p CABG, hypothyroidism, DM, GERD, HTN, HLD, stage III/stage IV CKD, and PAD who was recently discharged from the hospital after a GI bleed who presented w/ generalized weakness, and inability to ambulate, and shortness of breath.    On presentation to the hospital, patient was noted to be in atrial fibrillation with rapid ventricular response, acute kidney injury on chronic kidney disease stage III/IV, acute on chronic combined systolic and diastolic congestive heart failure with recent echocardiogram revealing EF of 30 to 82%, grade 2 diastolic dysfunction and decreased right ventricular systolic function.  UA is suggestive of likely UTI and blood culture is growing Klebsiella pneumonia, suspect likely secondary to UTI/sepsis.  Worsening mental status is noted.  Sodium is 126.  Patient remains volume overloaded, despite being on high-dose Lasix.  03/23/2018: BUN is 120 and serum creatinine is 3.3, with baseline serum creatinine of 1.8 to 2.  Albumin is 1.9, AST of 394, ALT of 139.  Patient was seen alongside patient's nurse and wife.  Updated patient's wife extensively.  Patient could not participate reasonably with a consultation due to altered mentation, possibly secondary to uremia.  To date, no GI bleed noted.  Nephrology and cardiology team consulted.  Guarded/poor prognosis, communicated to the patient's wife.  Will consult palliative care service.  Further management will depend on hospital course.  03/24/2018: Patient seen alongside patient's wife, daughter, son and daughter-in-law.  Blood culture final result grew Klebsiella ESBL.  We change antibiotics from IV Rocephin to meropenem.  Patient's family is expecting Dr. Tamala Julian, patient's cardiologist to have an  input in goal of care discussion.  For now, will continue to treat patient.  Cardiology and nephrology input is highly appreciated.  Awaiting stool fecal occult blood to rule out occult GI bleed.  11//1/19: Patient actually looks much better today.  Patient is more awake.  Patient is more communicative.  Leg edema is improving.  Improvement in serum creatinine.  BUN remains at 122.  Stool for fecal occult blood when feasible.  Continue current management.  Continue IV meropenem as blood culture grew Klebsiella ESBL.  03/26/18: Patient seen alongside patient's wife.  Patient remains stable.  Improving renal function.  Potassium is 2.6 today, will replete.  We will continue current management.  Subjective: Patient is more awake today and communicative today. No fever chills. No chest pain. No worsening of shortness of breath.  Assessment & Plan: Acute exacerbation of chronic combined systolic and diastolic CHF: -Consult heart failure team. -Query diuresis resistant. -BUN of 120, serum creatinine of 3.3, and with likely uremia - query role of hemodialysis, but patient may not be a good candidate due to multiple cold morbidities. -Latest echo revealed EF of 30 to 35% (kindly see above). - Await further recommendation from the heart failure team. - Nephrology team also consulted.  -03/25/18: Continue current regimen.  Continue to monitor renal function and electrolytes.  Leg edema is improving.  Patient actually looks much better today. -03/26/2018: Renal function is improving.  The BUN is down to 107 and serum creatinine is down to 2.47.  Continue current management.  Atrial flutter/Atrial Fib with rapid ventricular response: Cardiology is directing care  Eliquis was held during recent admit for GIB, but resumed at time of d/c  S/P TEE DCCV  UTI/bacteremia/sepsis, likely secondary to Klebsiella: 03/24/2018: Blood culture final result revealed Klebsiella pneumonia, ESBL. Discontinued IV  Rocephin. Continue IV meropenem.  Hypoalbuminemia:  May Indicate poor prognosis.    Acute kidney injury on chronic kidney disease stage III/IV:  Nephrology input is appreciated.  As per nephrology team, not ideal candidate for dialysis. We will continue to assess. 03/25/2018: Serum creatinine is trending downwards. 03/26/2018: Renal function is improving.  The BUN is down to 107 and serum creatinine is down to 2.47.  Continue current management.  Overall, long-term prognosis remains guarded.  Recent Labs  Lab 03/22/18 0320 03/23/18 0417 03/24/18 1019 03/25/18 1049 03/26/18 1059  CREATININE 2.92* 3.33* 3.17* 2.83* 2.47*     Hypokalemia: Potassium is 2.6 today Cautious replacement considering impaired renal function.  Hyponatremia: Improving Volume related  DM2: Continue to optimize.  HLD  Anemia of chronic kidney disease and recent GIB Follow trend - Hgb stable    GIB Oct 2019 EGD 03/10/18 w/o clear source of bleeding   CAD s/p CABG Has suspected graft failure - not a candidate for cardiac cath given kidney disease   Cystic lesion of pancreas Noted on MRI abdom earlier this month - mildly elevated CA 19-9 not felt to be worrisome per GI   Consulted palliative team. Patient and family still want patient to be actively treated.   DVT prophylaxis: eliquis  Code Status: FULL CODE Family Communication: spoke wife.    Disposition Plan: Will depend on hospital course.    Consultants:  [facility] Cardiology Heart failure team. Nephrology.  Antimicrobials:  IV Rocephin discontinued on 03/24/2018. IV meropenem  Objective: Blood pressure (!) 128/55, pulse 60, temperature 97.6 F (36.4 C), temperature source Axillary, resp. rate 16, weight 114.6 kg, SpO2 98 %.  Intake/Output Summary (Last 24 hours) at 03/26/2018 1325 Last data filed at 03/26/2018 0751 Gross per 24 hour  Intake 399.2 ml  Output 600 ml  Net -200.8 ml   Filed Weights   03/23/18 0528 03/24/18 0457  03/25/18 0428  Weight: 115.5 kg 115.8 kg 114.6 kg    Examination: General: More awake and alert.    Lungs: Decreased air entry. Cardiovascular: S1-S2. Abdomen: Nontender, obese, soft, BS+, no rebound Extremities: ++ BLE edema   CBC: Recent Labs  Lab 03/23/18 0417 03/24/18 1019 03/25/18 1049  WBC 10.0 10.6* 8.5  NEUTROABS  --  9.0* 7.1  HGB 10.0* 10.3* 9.7*  HCT 31.1* 32.4* 31.0*  MCV 85.9 86.2 86.8  PLT 316 331 694   Basic Metabolic Panel: Recent Labs  Lab 03/24/18 1019 03/25/18 1049 03/26/18 1059  NA 129* 129* 133*  K 3.5 4.4 2.6*  CL 90* 89* 91*  CO2 26 27 32  GLUCOSE 128* 210* 167*  BUN 124* 122* 107*  CREATININE 3.17* 2.83* 2.47*  CALCIUM 8.1* 8.4* 8.4*  MG 2.2 2.5* 2.4  PHOS  --  4.7* 4.1   GFR: Estimated Creatinine Clearance: 31.7 mL/min (A) (by C-G formula based on SCr of 2.47 mg/dL (H)).  Liver Function Tests: Recent Labs  Lab 03/20/18 0537 03/21/18 0509 03/23/18 0417 03/24/18 1019 03/25/18 1049 03/26/18 1059  AST 49* 50* 394* 168*  --   --   ALT 22 29 139* 119*  --   --   ALKPHOS 40 39 41 43  --   --   BILITOT 1.2 1.1 1.0 0.9  --   --   PROT 5.7* 5.8* 5.5* 5.4*  --   --   ALBUMIN 2.2* 2.2*  1.9* 2.0* 2.0* 2.0*    Cardiac Enzymes: No results for input(s): CKTOTAL, CKMB, CKMBINDEX, TROPONINI in the last 168 hours.  HbA1C: Hgb A1c MFr Bld  Date/Time Value Ref Range Status  10/21/2017 12:33 PM 6.2 (H) 4.8 - 5.6 % Final    Comment:    (NOTE) Pre diabetes:          5.7%-6.4% Diabetes:              >6.4% Glycemic control for   <7.0% adults with diabetes   09/24/2011 05:50 AM 6.4 (H) <5.7 % Final    Comment:    (NOTE)                                                                       According to the ADA Clinical Practice Recommendations for 2011, when HbA1c is used as a screening test:  >=6.5%   Diagnostic of Diabetes Mellitus           (if abnormal result is confirmed) 5.7-6.4%   Increased risk of developing Diabetes  [place] References:Diagnosis and Classification of Diabetes Mellitus,Diabetes OEHO,1224,82(NOIBB 1):S62-S69 and Standards of Medical Care in         Diabetes - 2011,Diabetes CWUG,8916,94 (Suppl 1):S11-S61.    CBG: Recent Labs  Lab 03/25/18 1144 03/25/18 1651 03/25/18 2155 03/26/18 0802 03/26/18 1154  GLUCAP 199* 199* 151* 127* 154*    Scheduled Meds: . amiodarone  200 mg Oral BID  . atorvastatin  20 mg Oral QPC supper  . carvedilol  3.125 mg Oral BID  . fenofibrate  54 mg Oral Daily  . gabapentin  100 mg Oral BID PC  . insulin aspart  0-20 Units Subcutaneous TID WC  . insulin aspart  0-5 Units Subcutaneous QHS  . insulin glargine  8 Units Subcutaneous QHS  . levothyroxine  100 mcg Oral QAC breakfast  . pantoprazole  40 mg Oral Daily  . tamsulosin  0.4 mg Oral Q M,W,F   Continuous Infusions: . furosemide 160 mg (03/26/18 0839)  . meropenem (MERREM) IV 1 g (03/26/18 0531)     LOS: 8 days   Bonnell Public, MD [facility] Office  [phone] Pager - Text Page per Amion  If 7PM-7AM, please contact night-coverage per Amion 03/26/2018, 1:25 PM

## 2018-03-26 NOTE — Progress Notes (Signed)
Pharmacist Heart Failure Core Measure Documentation  Assessment: [name] has an EF documented as 30-35% on 03/22/18 by Echo.  Rationale: Heart failure patients with left ventricular systolic dysfunction (LVSD) and an EF < 40% should be prescribed an angiotensin converting enzyme inhibitor (ACEI) or angiotensin receptor blocker (ARB) at discharge unless a contraindication is documented in the medical record.  This patient is not currently on an ACEI or ARB for HF.  This note is being placed in the record in order to provide documentation that a contraindication to the use of these agents is present for this encounter.  ACE Inhibitor or Angiotensin Receptor Blocker is contraindicated (specify all that apply)  []   ACEI allergy AND ARB allergy []   Angioedema []   Moderate or severe aortic stenosis []   Hyperkalemia []   Hypotension []   Renal artery stenosis [x]   Worsening renal function, preexisting renal disease or dysfunction  Hildred Laser, PharmD Clinical Pharmacist **Pharmacist phone directory can now be found on [email or web address] ([email or web address]).  Listed under [place].

## 2018-03-27 LAB — MAGNESIUM: Magnesium: 2.2 mg/dL (ref 1.7–2.4)

## 2018-03-27 LAB — GLUCOSE, CAPILLARY
GLUCOSE-CAPILLARY: 151 mg/dL — AB (ref 70–99)
GLUCOSE-CAPILLARY: 132 mg/dL — AB (ref 70–99)
GLUCOSE-CAPILLARY: 188 mg/dL — AB (ref 70–99)
Glucose-Capillary: 165 mg/dL — ABNORMAL HIGH (ref 70–99)

## 2018-03-27 LAB — RENAL FUNCTION PANEL
Albumin: 2.1 g/dL — ABNORMAL LOW (ref 3.5–5.0)
Anion gap: 11 (ref 5–15)
BUN: 100 mg/dL — ABNORMAL HIGH (ref 8–23)
CO2: 30 mmol/L (ref 22–32)
Calcium: 8.4 mg/dL — ABNORMAL LOW (ref 8.9–10.3)
Chloride: 93 mmol/L — ABNORMAL LOW (ref 98–111)
Creatinine, Ser: 2.49 mg/dL — ABNORMAL HIGH (ref 0.61–1.24)
GFR calc Af Amer: 27 mL/min — ABNORMAL LOW (ref >60)
GFR calc non Af Amer: 23 mL/min — ABNORMAL LOW (ref >60)
Glucose, Bld: 144 mg/dL — ABNORMAL HIGH (ref 70–99)
Phosphorus: 3.4 mg/dL (ref 2.5–4.6)
Potassium: 3 mmol/L — ABNORMAL LOW (ref 3.5–5.1)
Sodium: 134 mmol/L — ABNORMAL LOW (ref 135–145)

## 2018-03-27 MED ORDER — FUROSEMIDE 10 MG/ML IJ SOLN
100.0000 mg | Freq: Two times a day (BID) | INTRAVENOUS | Status: DC
  Administered 2018-03-27 – 2018-04-01 (×11): 100 mg via INTRAVENOUS
  Filled 2018-03-27 (×11): qty 10

## 2018-03-27 MED ORDER — POTASSIUM CHLORIDE CRYS ER 20 MEQ PO TBCR
40.0000 meq | EXTENDED_RELEASE_TABLET | ORAL | Status: AC
  Administered 2018-03-27 (×2): 40 meq via ORAL
  Filled 2018-03-27 (×2): qty 2

## 2018-03-27 NOTE — Progress Notes (Signed)
TEAM 1 - Stepdown/ICU TEAM  [name]  PJA:[id number] DOB: December 17, 1940 DOA: 03/18/2018 PCP: Lavone Orn, MD    Brief Narrative:  77 y.o. male with a hx of systolic heart failure, atrial flutter, recent GI bleed, CAD s/p CABG, hypothyroidism, DM, GERD, HTN, HLD, stage III/stage IV CKD, and PAD who was recently discharged from the hospital after a GI bleed who presented w/ generalized weakness, and inability to ambulate, and shortness of breath.    On presentation to the hospital, patient was noted to be in atrial fibrillation with rapid ventricular response, acute kidney injury on chronic kidney disease stage III/IV, acute on chronic combined systolic and diastolic congestive heart failure with recent echocardiogram revealing EF of 30 to 34%, grade 2 diastolic dysfunction and decreased right ventricular systolic function.  UA is suggestive of likely UTI and blood culture is growing Klebsiella pneumonia, suspect likely secondary to UTI/sepsis.  Worsening mental status is noted.  Sodium is 126.  Patient remains volume overloaded, despite being on high-dose Lasix.  03/23/2018: BUN is 120 and serum creatinine is 3.3, with baseline serum creatinine of 1.8 to 2.  Albumin is 1.9, AST of 394, ALT of 139.  Patient was seen alongside patient's nurse and wife.  Updated patient's wife extensively.  Patient could not participate reasonably with a consultation due to altered mentation, possibly secondary to uremia.  To date, no GI bleed noted.  Nephrology and cardiology team consulted.  Guarded/poor prognosis, communicated to the patient's wife.  Will consult palliative care service.  Further management will depend on hospital course.  03/24/2018: Patient seen alongside patient's wife, daughter, son and daughter-in-law.  Blood culture final result grew Klebsiella ESBL.  We change antibiotics from IV Rocephin to meropenem.  Patient's family is expecting Dr. Tamala Julian, patient's cardiologist to have an  input in goal of care discussion.  For now, will continue to treat patient.  Cardiology and nephrology input is highly appreciated.  Awaiting stool fecal occult blood to rule out occult GI bleed.  11//1/19: Patient actually looks much better today.  Patient is more awake.  Patient is more communicative.  Leg edema is improving.  Improvement in serum creatinine.  BUN remains at 122.  Stool for fecal occult blood when feasible.  Continue current management.  Continue IV meropenem as blood culture grew Klebsiella ESBL.  03/26/18: Patient seen alongside patient's wife.  Patient remains stable.  Improving renal function.  Potassium is 2.6 today, will replete.  We will continue current management.  03/27/18: Patient seen alongside patient's wife.  Patient continues to improve.  Patient is more awake and alert.  Patient is more interactive today.  Subjective: Patient is communicative. No fever chills. No chest pain. No worsening of shortness of breath.  Assessment & Plan: Acute exacerbation of chronic combined systolic and diastolic CHF: -Consult heart failure team. -Query diuresis resistant. -BUN of 120, serum creatinine of 3.3, and with likely uremia - query role of hemodialysis, but patient may not be a good candidate due to multiple cold morbidities. -Latest echo revealed EF of 30 to 35% (kindly see above). - Await further recommendation from the heart failure team. - Nephrology team also consulted.  -03/25/18: Continue current regimen.  Continue to monitor renal function and electrolytes.  Leg edema is improving.  Patient actually looks much better today. -03/26/2018: Renal function is improving.  The BUN is down to 107 and serum creatinine is down to 2.47.  Continue current management. 03/27/2018: Decrease IV Lasix 200 mg twice  daily.  Continue to monitor renal function and electrolytes.  Atrial flutter/Atrial Fib with rapid ventricular response: Cardiology is directing care  Eliquis was held  during recent admit for GIB, but resumed at time of d/c  S/P TEE DCCV  UTI/bacteremia/sepsis, likely secondary to Klebsiella: 03/24/2018: Blood culture final result revealed Klebsiella pneumonia, ESBL. Discontinued IV Rocephin. Continue IV meropenem. Complete course of IV meropenem (2 weeks course).  Hypoalbuminemia:  May Indicate poor prognosis.    Acute kidney injury on chronic kidney disease stage III/IV:  Nephrology input is appreciated.  As per nephrology team, not ideal candidate for dialysis. We will continue to assess. 03/25/2018: Serum creatinine is trending downwards. 03/26/2018: Renal function is improving.  The BUN is down to 107 and serum creatinine is down to 2.47.  Continue current management. 03/27/2018: AKI continues to improve.  Monitor closely.  Overall, long-term prognosis remains guarded.  Recent Labs  Lab 03/24/18 1019 03/25/18 1049 03/26/18 1059 03/26/18 1733 03/27/18 0341  CREATININE 3.17* 2.83* 2.47* 2.65* 2.49*     Hypokalemia: Potassium is 3 today Cautious replacement considering impaired renal function.  Hyponatremia: Resolved significantly Volume related  DM2: Continue to optimize.  HLD  Anemia of chronic kidney disease and recent GIB Follow trend - Hgb stable    GIB Oct 2019 EGD 03/10/18 w/o clear source of bleeding   CAD s/p CABG Has suspected graft failure - not a candidate for cardiac cath given kidney disease   Cystic lesion of pancreas Noted on MRI abdom earlier this month - mildly elevated CA 19-9 not felt to be worrisome per GI   Consulted palliative team. Patient and family still want patient to be actively treated.   DVT prophylaxis: eliquis  Code Status: FULL CODE Family Communication: spoke wife.    Disposition Plan: Will depend on hospital course.    Consultants:  [facility] Cardiology Heart failure team. Nephrology.  Antimicrobials:  IV Rocephin discontinued on 03/24/2018. IV meropenem  Objective: Blood  pressure (!) 145/52, pulse (!) 57, temperature (!) 97.4 F (36.3 C), temperature source Oral, resp. rate 19, weight 116.1 kg, SpO2 98 %.  Intake/Output Summary (Last 24 hours) at 03/27/2018 1526 Last data filed at 03/27/2018 1143 Gross per 24 hour  Intake 2306.05 ml  Output 1200 ml  Net 1106.05 ml   Filed Weights   03/24/18 0457 03/25/18 0428 03/27/18 0513  Weight: 115.8 kg 114.6 kg 116.1 kg    Examination: General: Awake and communicative.    Lungs: Decreased air entry. Cardiovascular: S1-S2. Abdomen: Nontender, obese, soft, BS+, no rebound Extremities: ++ BLE edema   CBC: Recent Labs  Lab 03/23/18 0417 03/24/18 1019 03/25/18 1049  WBC 10.0 10.6* 8.5  NEUTROABS  --  9.0* 7.1  HGB 10.0* 10.3* 9.7*  HCT 31.1* 32.4* 31.0*  MCV 85.9 86.2 86.8  PLT 316 331 458   Basic Metabolic Panel: Recent Labs  Lab 03/25/18 1049 03/26/18 1059 03/26/18 1733 03/27/18 0341  NA 129* 133* 131* 134*  K 4.4 2.6* 3.2* 3.0*  CL 89* 91* 88* 93*  CO2 27 32 30 30  GLUCOSE 210* 167* 208* 144*  BUN 122* 107* 105* 100*  CREATININE 2.83* 2.47* 2.65* 2.49*  CALCIUM 8.4* 8.4* 8.4* 8.4*  MG 2.5* 2.4  --  2.2  PHOS 4.7* 4.1 4.1 3.4   GFR: Estimated Creatinine Clearance: 31.7 mL/min (A) (by C-G formula based on SCr of 2.49 mg/dL (H)).  Liver Function Tests: Recent Labs  Lab 03/21/18 0509 03/23/18 0417 03/24/18 1019 03/25/18 1049 03/26/18  1059 03/26/18 1733 03/27/18 0341  AST 50* 394* 168*  --   --   --   --   ALT 29 139* 119*  --   --   --   --   ALKPHOS 39 41 43  --   --   --   --   BILITOT 1.1 1.0 0.9  --   --   --   --   PROT 5.8* 5.5* 5.4*  --   --   --   --   ALBUMIN 2.2* 1.9* 2.0* 2.0* 2.0* 2.2* 2.1*    Cardiac Enzymes: No results for input(s): CKTOTAL, CKMB, CKMBINDEX, TROPONINI in the last 168 hours.  HbA1C: Hgb A1c MFr Bld  Date/Time Value Ref Range Status  10/21/2017 12:33 PM 6.2 (H) 4.8 - 5.6 % Final    Comment:    (NOTE) Pre diabetes:           5.7%-6.4% Diabetes:              >6.4% Glycemic control for   <7.0% adults with diabetes   09/24/2011 05:50 AM 6.4 (H) <5.7 % Final    Comment:    (NOTE)                                                                       According to the ADA Clinical Practice Recommendations for 2011, when HbA1c is used as a screening test:  >=6.5%   Diagnostic of Diabetes Mellitus           (if abnormal result is confirmed) 5.7-6.4%   Increased risk of developing Diabetes Mellitus References:Diagnosis and Classification of Diabetes Mellitus,Diabetes SLHT,3428,76(OTLXB 1):S62-S69 and Standards of Medical Care in         Diabetes - 2011,Diabetes WIOM,3559,74 (Suppl 1):S11-S61.    CBG: Recent Labs  Lab 03/26/18 1154 03/26/18 1625 03/26/18 2139 03/27/18 0802 03/27/18 1144  GLUCAP 154* 210* 143* 151* 132*    Scheduled Meds: . amiodarone  200 mg Oral BID  . atorvastatin  20 mg Oral QPC supper  . carvedilol  3.125 mg Oral BID  . fenofibrate  54 mg Oral Daily  . gabapentin  100 mg Oral BID PC  . insulin aspart  0-20 Units Subcutaneous TID WC  . insulin aspart  0-5 Units Subcutaneous QHS  . insulin glargine  8 Units Subcutaneous QHS  . levothyroxine  100 mcg Oral QAC breakfast  . pantoprazole  40 mg Oral Daily  . tamsulosin  0.4 mg Oral Q M,W,F   Continuous Infusions: . furosemide 100 mg (03/27/18 0831)  . meropenem (MERREM) IV 200 mL/hr at 03/27/18 0606     LOS: 9 days   Bonnell Public, MD [facility] Office  [phone] Pager - Text Page per Amion  If 7PM-7AM, please contact night-coverage per Amion 03/27/2018, 3:26 PM

## 2018-03-27 NOTE — Progress Notes (Signed)
Chaplain attempted to visit patient vis--vis referral from [employer].  Patient was sleeping; wife was bedside.  Chaplain told wife that we would try another time.    Please call if support is needed before then.    Minus Liberty, [place]   03/27/18 2100  Clinical Encounter Type  Visited With Family;Patient not available  Visit Type Initial  Referral From Chaplain  Consult/Referral To Chaplain  Spiritual Encounters  Spiritual Needs Emotional

## 2018-03-27 NOTE — Progress Notes (Signed)
[facility] Progress Note    Assessment/ Plan:   77 y.o.malesystolic CHF, aflutter, recent GIB, CASHD s/p CABG, DM, HTN, PAD, CKDIIIB/IVwith recentGIBhere w/weakness and dyspneafound to be in HF and aacute on chronic renal failure w/BUN/Cr of 75/2.51.Also found to be in afib RVR, started on high dose IV Lasix and CV on 10/29 with conversion to NSR.Alsobacteremic with E. Coli and Klebsiella Pneumoniae tx w/ Cefepime and then changed to Rocephin.   1. AKI on advanced CKD IIIB/IV with poor reserve and now in cardiorenal syndrome +/- superimposed ATN. I explained to the spouse and daughter who were bedside that I do offer dialysis if it's going to improve QOL or if it's a temporizing measure until a reversible cause can be adequately treated. I do not feel that Mr. Dunne falls in this category with his extensive medical history including compromised systolic cardiac function with an EF of 30%, CASHD, PAD.  -Bladder scan10/30 only 149ml - I'm not sure he would even tolerate dialysis and he would be high risk for not regaining renal function and becoming dialysis dependent. He is certainly not a candidate for long term dialysis.The family agrees that we should not use such aggressive measures.  - Agree with a palliative consult which the family concurred with.  - Certainly possible that with a good response to tx for bacteremia + conversion to NSR there may be improvement in renal function.   -  He continues to be more alert today in the chair and answering questions appropriately. Renal function continues to improve w/ great UOP.  - Will sign off at this time; please reconsult as needed.  2. Hyponatremia - hypervolemic variety secondary to CHF. Improving with good diuresis and improvement of renal/ l cardiac function. 3. CHF - EF 30% responding nicely to IV Lasix. 4. Bacteremia - E.Coli + Kleb PNA - Cefepime -> Rocephin 5. CASHD 6. PAD  Subjective:    Still has dyspnea but no worse. Denies CP/c/n/v.  More concerned with his granddaughter's swim meet this weekend which is good; he has a good sense of humor as well and is very pleasant now that he is more alert.   Objective:   BP (!) 145/52 (BP Location: Right Arm)   Pulse (!) 57   Temp (!) 97.4 F (36.3 C) (Oral)   Resp 19   Wt 116.1 kg   SpO2 98%   BMI 35.69 kg/m   Intake/Output Summary (Last 24 hours) at 03/27/2018 6834 Last data filed at 03/27/2018 0606 Gross per 24 hour  Intake 2306.05 ml  Output 1200 ml  Net 1106.05 ml   Weight change:   Physical Exam: General appearance:NAD, not tachypneic Head:NCAT Resp:poor air movement + effort Chest wall:no tenderness Cardio:RRR, no longer tachy HD:QQIW, non-tender; bowel sounds normal; no masses, no organomegaly Extremities:edema2+ Neurologic:Much more alert and cooperative today  Imaging: No results found.  Labs: BMET Recent Labs  Lab 03/22/18 0320 03/23/18 0417 03/24/18 1019 03/25/18 1049 03/26/18 1059 03/26/18 1733 03/27/18 0341  NA 126* 126* 129* 129* 133* 131* 134*  K 4.7 4.7 3.5 4.4 2.6* 3.2* 3.0*  CL 87* 89* 90* 89* 91* 88* 93*  CO2 25 25 26 27  32 30 30  GLUCOSE 211* 171* 128* 210* 167* 208* 144*  BUN 106* 120* 124* 122* 107* 105* 100*  CREATININE 2.92* 3.33* 3.17* 2.83* 2.47* 2.65* 2.49*  CALCIUM 8.4* 8.2* 8.1* 8.4* 8.4* 8.4* 8.4*  PHOS  --   --   --  4.7* 4.1 4.1  3.4   CBC Recent Labs  Lab 03/22/18 0320 03/23/18 0417 03/24/18 1019 03/25/18 1049  WBC 10.8* 10.0 10.6* 8.5  NEUTROABS  --   --  9.0* 7.1  HGB 10.0* 10.0* 10.3* 9.7*  HCT 31.1* 31.1* 32.4* 31.0*  MCV 86.9 85.9 86.2 86.8  PLT 281 316 331 367    Medications:    . amiodarone  200 mg Oral BID  . atorvastatin  20 mg Oral QPC supper  . carvedilol  3.125 mg Oral BID  . fenofibrate  54 mg Oral Daily  . gabapentin  100 mg Oral BID PC  . insulin aspart  0-20 Units Subcutaneous TID WC  . insulin aspart  0-5 Units  Subcutaneous QHS  . insulin glargine  8 Units Subcutaneous QHS  . levothyroxine  100 mcg Oral QAC breakfast  . pantoprazole  40 mg Oral Daily  . potassium chloride  40 mEq Oral Q4H  . tamsulosin  0.4 mg Oral Q M,W,F      Otelia Santee, MD 03/27/2018, 9:42 AM

## 2018-03-28 DIAGNOSIS — R531 Weakness: Secondary | ICD-10-CM

## 2018-03-28 LAB — CULTURE, BLOOD (ROUTINE X 2)
Culture: NO GROWTH
Culture: NO GROWTH
Special Requests: ADEQUATE
Special Requests: ADEQUATE

## 2018-03-28 LAB — RENAL FUNCTION PANEL
Albumin: 2.1 g/dL — ABNORMAL LOW (ref 3.5–5.0)
Anion gap: 6 (ref 5–15)
BUN: 89 mg/dL — ABNORMAL HIGH (ref 8–23)
CO2: 33 mmol/L — ABNORMAL HIGH (ref 22–32)
Calcium: 8.4 mg/dL — ABNORMAL LOW (ref 8.9–10.3)
Chloride: 97 mmol/L — ABNORMAL LOW (ref 98–111)
Creatinine, Ser: 2.1 mg/dL — ABNORMAL HIGH (ref 0.61–1.24)
GFR calc Af Amer: 33 mL/min — ABNORMAL LOW (ref >60)
GFR calc non Af Amer: 29 mL/min — ABNORMAL LOW (ref >60)
Glucose, Bld: 162 mg/dL — ABNORMAL HIGH (ref 70–99)
Phosphorus: 2.8 mg/dL (ref 2.5–4.6)
Potassium: 3.8 mmol/L (ref 3.5–5.1)
Sodium: 136 mmol/L (ref 135–145)

## 2018-03-28 LAB — GLUCOSE, CAPILLARY
GLUCOSE-CAPILLARY: 182 mg/dL — AB (ref 70–99)
GLUCOSE-CAPILLARY: 168 mg/dL — AB (ref 70–99)
GLUCOSE-CAPILLARY: 121 mg/dL — AB (ref 70–99)
Glucose-Capillary: 157 mg/dL — ABNORMAL HIGH (ref 70–99)

## 2018-03-28 NOTE — Progress Notes (Signed)
[facility] TEAM 1 - Stepdown/ICU TEAM  [name]  ALP:[id number] DOB: October 27, 1940 DOA: 03/18/2018 PCP: Lavone Orn, MD    Brief Narrative:  77 y.o. male with a hx of systolic heart failure, atrial flutter, recent GI bleed, CAD s/p CABG, hypothyroidism, DM, GERD, HTN, HLD, stage III/stage IV CKD, and PAD who was recently discharged from the hospital after a GI bleed who presented w/ generalized weakness, and inability to ambulate, and shortness of breath.    On presentation to the hospital, patient was noted to be in atrial fibrillation with rapid ventricular response, acute kidney injury on chronic kidney disease stage III/IV, acute on chronic combined systolic and diastolic congestive heart failure with recent echocardiogram revealing EF of 30 to 35%, grade 2 diastolic dysfunction and decreased right ventricular systolic function.  UA is suggestive of likely UTI and blood culture is growing Klebsiella pneumonia, suspect likely secondary to UTI/sepsis.  Worsening mental status is noted.  Sodium is 126.  Patient remains volume overloaded, despite being on high-dose Lasix.  03/23/2018: BUN is 120 and serum creatinine is 3.3, with baseline serum creatinine of 1.8 to 2.  Albumin is 1.9, AST of 394, ALT of 139.  Patient was seen alongside patient's nurse and wife.  Updated patient's wife extensively.  Patient could not participate reasonably with a consultation due to altered mentation, possibly secondary to uremia.  To date, no GI bleed noted.  Nephrology and cardiology team consulted.  Guarded/poor prognosis, communicated to the patient's wife.  Will consult palliative care service.  Further management will depend on hospital course.  03/24/2018: Patient seen alongside patient's wife, daughter, son and daughter-in-law.  Blood culture final result grew Klebsiella ESBL.  We change antibiotics from IV Rocephin to meropenem.  Patient's family is expecting Dr. Tamala Julian, patient's cardiologist to have an  input in goal of care discussion.  For now, will continue to treat patient.  Cardiology and nephrology input is highly appreciated.  Awaiting stool fecal occult blood to rule out occult GI bleed.  11//1/19: Patient actually looks much better today.  Patient is more awake.  Patient is more communicative.  Leg edema is improving.  Improvement in serum creatinine.  BUN remains at 122.  Stool for fecal occult blood when feasible.  Continue current management.  Continue IV meropenem as blood culture grew Klebsiella ESBL.  03/26/18: Patient seen alongside patient's wife.  Patient remains stable.  Improving renal function.  Potassium is 2.6 today, will replete.  We will continue current management.  03/27/18: Patient seen alongside patient's wife.  Patient continues to improve.  Patient is more awake and alert.  Patient is more interactive today.  03/28/18: Patient seen alongside patient's wife.  Patient continues to improve clinically.  Encephalopathy has resolved.  Volume overload process.  Patient still has significant lower extremity edema.  We will continue IV diuretics for now.  Improving.  BUN is down to 89, and his serum creatinine is 2.1.  Prognosis remains guarded.  Palliative care input is highly appreciated.  Subjective: No fever chills. No chest pain. No worsening of shortness of breath.  Assessment & Plan: Acute exacerbation of chronic combined systolic and diastolic CHF: -Consult heart failure team. -Query diuresis resistant. -BUN of 120, serum creatinine of 3.3, and with likely uremia - query role of hemodialysis, but patient may not be a good candidate due to multiple cold morbidities. -Latest echo revealed EF of 30 to 35% (kindly see above). - Await further recommendation from the heart failure team. -  Nephrology team also consulted.  -03/25/18: Continue current regimen.  Continue to monitor renal function and electrolytes.  Leg edema is improving.  Patient actually looks much better  today. -03/26/2018: Renal function is improving.  The BUN is down to 107 and serum creatinine is down to 2.47.  Continue current management. 03/27/2018: Decrease IV Lasix to 100 mg twice daily.  Continue to monitor renal function and electrolytes. 03/28/18: Continue current dosage of diuretics.  Continue to monitor renal function and electrolytes.  Atrial flutter/Atrial Fib with rapid ventricular response: Cardiology is directing care  Eliquis was held during recent admit for GIB, but resumed at time of d/c  S/P TEE DCCV  UTI/bacteremia/sepsis, likely secondary to Klebsiella: 03/24/2018: Blood culture final result revealed Klebsiella pneumonia, ESBL. Discontinued IV Rocephin. Continue IV meropenem. Complete course of IV meropenem (2 weeks course).  Hypoalbuminemia:  May be indicative of poor prognosis.    Acute kidney injury on chronic kidney disease stage III/IV:  Nephrology input is appreciated.  As per nephrology team, not ideal candidate for dialysis. We will continue to assess. 03/25/2018: Serum creatinine is trending downwards. 03/26/2018: Renal function is improving.  The BUN is down to 107 and serum creatinine is down to 2.47.  Continue current management. 03/27/2018: AKI continues to improve.  Monitor closely. 03/28/2018: BUN is 89 today and creatinine is 2.1.  Overall, long-term prognosis remains guarded.  Recent Labs  Lab 03/25/18 1049 03/26/18 1059 03/26/18 1733 03/27/18 0341 03/28/18 0600  CREATININE 2.83* 2.47* 2.65* 2.49* 2.10*     Hypokalemia: Potassium is 3 today Cautious replacement considering impaired renal function.  Hyponatremia: Resolved significantly Volume related  DM2: Continue to optimize.  HLD  Anemia of chronic kidney disease and recent GIB Follow trend - Hgb stable    GIB Oct 2019 EGD 03/10/18 w/o clear source of bleeding   CAD s/p CABG Has suspected graft failure - not a candidate for cardiac cath given kidney disease   Cystic  lesion of pancreas Noted on MRI abdom earlier this month - mildly elevated CA 19-9 not felt to be worrisome per GI   Consulted palliative team. Patient and family still want patient to be actively treated.   DVT prophylaxis: eliquis  Code Status: FULL CODE Family Communication: spoke wife.    Disposition Plan: Will depend on hospital course.    Consultants:  [facility] Cardiology Heart failure team. Nephrology.  Antimicrobials:  IV Rocephin discontinued on 03/24/2018. IV meropenem  Objective: Blood pressure (!) 128/56, pulse (!) 52, temperature 98 F (36.7 C), temperature source Oral, resp. rate 20, weight 115.8 kg, SpO2 95 %.  Intake/Output Summary (Last 24 hours) at 03/28/2018 1553 Last data filed at 03/28/2018 0900 Gross per 24 hour  Intake 360 ml  Output 1500 ml  Net -1140 ml   Filed Weights   03/25/18 0428 03/27/18 0513 03/28/18 0625  Weight: 114.6 kg 116.1 kg 115.8 kg    Examination: General: Awake and communicative.    Lungs: Decreased air entry. Cardiovascular: S1-S2. Abdomen: Nontender, obese, soft, BS+, no rebound Extremities: ++ BLE edema   CBC: Recent Labs  Lab 03/23/18 0417 03/24/18 1019 03/25/18 1049  WBC 10.0 10.6* 8.5  NEUTROABS  --  9.0* 7.1  HGB 10.0* 10.3* 9.7*  HCT 31.1* 32.4* 31.0*  MCV 85.9 86.2 86.8  PLT 316 331 474   Basic Metabolic Panel: Recent Labs  Lab 03/25/18 1049 03/26/18 1059 03/26/18 1733 03/27/18 0341 03/28/18 0600  NA 129* 133* 131* 134* 136  K 4.4 2.6* 3.2*  3.0* 3.8  CL 89* 91* 88* 93* 97*  CO2 27 32 30 30 33*  GLUCOSE 210* 167* 208* 144* 162*  BUN 122* 107* 105* 100* 89*  CREATININE 2.83* 2.47* 2.65* 2.49* 2.10*  CALCIUM 8.4* 8.4* 8.4* 8.4* 8.4*  MG 2.5* 2.4  --  2.2  --   PHOS 4.7* 4.1 4.1 3.4 2.8   GFR: Estimated Creatinine Clearance: 37.5 mL/min (A) (by C-G formula based on SCr of 2.1 mg/dL (H)).  Liver Function Tests: Recent Labs  Lab 03/23/18 0417 03/24/18 1019  03/26/18 1059 03/26/18 1733  03/27/18 0341 03/28/18 0600  AST 394* 168*  --   --   --   --   --   ALT 139* 119*  --   --   --   --   --   ALKPHOS 41 43  --   --   --   --   --   BILITOT 1.0 0.9  --   --   --   --   --   PROT 5.5* 5.4*  --   --   --   --   --   ALBUMIN 1.9* 2.0*   < > 2.0* 2.2* 2.1* 2.1*   < > = values in this interval not displayed.    Cardiac Enzymes: No results for input(s): CKTOTAL, CKMB, CKMBINDEX, TROPONINI in the last 168 hours.  HbA1C: Hgb A1c MFr Bld  Date/Time Value Ref Range Status  10/21/2017 12:33 PM 6.2 (H) 4.8 - 5.6 % Final    Comment:    (NOTE) Pre diabetes:          5.7%-6.4% Diabetes:              >6.4% Glycemic control for   <7.0% adults with diabetes   09/24/2011 05:50 AM 6.4 (H) <5.7 % Final    Comment:    (NOTE)                                                                       According to the ADA Clinical Practice Recommendations for 2011, when HbA1c is used as a screening test:  >=6.5%   Diagnostic of Diabetes Mellitus           (if abnormal result is confirmed) 5.7-6.4%   Increased risk of developing Diabetes Mellitus References:Diagnosis and Classification of Diabetes Mellitus,Diabetes LKTG,2563,89(HTDSK 1):S62-S69 and Standards of Medical Care in         Diabetes - 2011,Diabetes AJGO,1157,26 (Suppl 1):S11-S61.    CBG: Recent Labs  Lab 03/27/18 1144 03/27/18 1707 03/27/18 2122 03/28/18 0740 03/28/18 1121  GLUCAP 132* 188* 165* 157* 182*    Scheduled Meds: . amiodarone  200 mg Oral BID  . atorvastatin  20 mg Oral QPC supper  . carvedilol  3.125 mg Oral BID  . fenofibrate  54 mg Oral Daily  . gabapentin  100 mg Oral BID PC  . insulin aspart  0-20 Units Subcutaneous TID WC  . insulin aspart  0-5 Units Subcutaneous QHS  . insulin glargine  8 Units Subcutaneous QHS  . levothyroxine  100 mcg Oral QAC breakfast  . pantoprazole  40 mg Oral Daily  . tamsulosin  0.4 mg Oral Q M,W,F   Continuous Infusions: .  furosemide 100 mg (03/28/18 0856)  .  meropenem (MERREM) IV 1 g (03/28/18 0555)     LOS: 10 days   Bonnell Public, MD [facility] Office  [phone] Pager - Text Page per Amion  If 7PM-7AM, please contact night-coverage per Amion 03/28/2018, 3:53 PM

## 2018-03-28 NOTE — Progress Notes (Signed)
Pt's daughter called strongly requesting to be involved in discharge planning ahead of time.  She wants him to be discharged either Wednesday or Thursday this week so that she can be here at the time of discharge.  Idolina Primer, RN

## 2018-03-28 NOTE — Progress Notes (Signed)
Patient ID: [name], male   DOB: [birth year], 77 y.o.   MRN: [id number]  This NP visited patient at the bedside as a follow up to last weeks GOCs meeting, palliative needs and emotional support.  Patient is alert and oriented today but still with poor insight into the seriousness of his current medical situation.  He appears weak and pale.  Wife is at bedside.  Continued conversation regarding the seriousness of current medical situation, goals of care, transitions of care and anticipatory care needs.  We again discussed hospice benefit.  Also discussed the pros and cons of transition to skilled nursing facility for rehabilitation versus home with home health.  Patient's wife presents a document for Declaration of a Natural death.  Desire is to document   a limited code today: No compressions, shock or intubation.  Discussed with bedside  Nursing.  Palliative medicine will continue to support holistically and assist patient and family with decisions for treatment options and transitions of care.  Discussed with patient and his wife  the importance of continued conversation with family and the medical providers regarding overall plan of care and treatment options,  ensuring decisions are within the context of the patients values and GOCs.  Questions and concerns addressed     Total time spent on the unit was 35 minutes  Greater than 50% of the time was spent in counseling and coordination of care  [name] NP  Palliative Medicine Team Team Phone # [phone] Pager [phone]

## 2018-03-29 ENCOUNTER — Telehealth: Payer: Self-pay | Admitting: Interventional Cardiology

## 2018-03-29 DIAGNOSIS — Z515 Encounter for palliative care: Secondary | ICD-10-CM

## 2018-03-29 LAB — RENAL FUNCTION PANEL
Albumin: 2.3 g/dL — ABNORMAL LOW (ref 3.5–5.0)
Anion gap: 8 (ref 5–15)
BUN: 75 mg/dL — ABNORMAL HIGH (ref 8–23)
CO2: 35 mmol/L — ABNORMAL HIGH (ref 22–32)
Calcium: 8.8 mg/dL — ABNORMAL LOW (ref 8.9–10.3)
Chloride: 96 mmol/L — ABNORMAL LOW (ref 98–111)
Creatinine, Ser: 1.87 mg/dL — ABNORMAL HIGH (ref 0.61–1.24)
GFR calc Af Amer: 38 mL/min — ABNORMAL LOW (ref >60)
GFR calc non Af Amer: 33 mL/min — ABNORMAL LOW (ref >60)
Glucose, Bld: 149 mg/dL — ABNORMAL HIGH (ref 70–99)
Phosphorus: 2.5 mg/dL (ref 2.5–4.6)
Potassium: 3.8 mmol/L (ref 3.5–5.1)
Sodium: 139 mmol/L (ref 135–145)

## 2018-03-29 LAB — OCCULT BLOOD X 1 CARD TO LAB, STOOL: Fecal Occult Bld: NEGATIVE

## 2018-03-29 LAB — GLUCOSE, CAPILLARY
GLUCOSE-CAPILLARY: 93 mg/dL (ref 70–99)
GLUCOSE-CAPILLARY: 161 mg/dL — AB (ref 70–99)
GLUCOSE-CAPILLARY: 129 mg/dL — AB (ref 70–99)
Glucose-Capillary: 90 mg/dL (ref 70–99)

## 2018-03-29 MED ORDER — ALBUMIN HUMAN 25 % IV SOLN
25.0000 g | Freq: Three times a day (TID) | INTRAVENOUS | Status: AC
  Administered 2018-03-29 – 2018-03-30 (×7): 12.5 g via INTRAVENOUS
  Filled 2018-03-29: qty 50
  Filled 2018-03-29: qty 100
  Filled 2018-03-29 (×2): qty 50
  Filled 2018-03-29: qty 100

## 2018-03-29 NOTE — Progress Notes (Addendum)
Hospice and [facility])   Requested to provide information to patient and family about palliative vs hospice services in the home after discharge by PMT.    Met with wife [name], discussed options. She is not ready for hospice, feels like he will improve.  Pt adamant about going home and not to SNF.  Advised wife that clinically it does not appear that she can care for him at home with his current condition.  If not ready for hospice, he would need SNF to try to increase his strength in anticipation of returning home.  They still desire to have doctors office visits and input from these physicians, in addition to curative measures.  Advised her that she will have to make the hard decision of SNF placement, if he is to gain strength and further independence, with the goal of returning home.    [name] states she has had [facility] in the past for custodial services, and she is willing and financially able to continue this should he go to a SNF, regain functionality and be able to return home.  Offered support, provided options.  She is leaning towards d/c to SNF, goal of returning home with private sitters and potentially palliative services in the home.  Please call with any hospice related questions or concerns.  Venia Carbon BSN, RN [facility] Liaison (listed in [place]  11/5 4pm, lengthy discussion with daugther Abby via phone.  She is adamant that pt meets criteria for hospice eligibility.  Educated her that although he may, the patient himself is not ready nor is his wife [name].  Daughter is also adamant that he not return to a SNF, and she would take a week off of work to provide the around the clock care that would likely be needed if he is to return home.  Needed further information on palliative medical services at home, provided her with this information.  She would like any discharge information to include her, as she feels she can help  interpret the information for her father and step-mother.

## 2018-03-29 NOTE — Progress Notes (Signed)
Physical Therapy Treatment Patient Details Name: [name] MRN: [id number] DOB: Mar 13, 1941 Today's Date: 03/29/2018    History of Present Illness Pt is a 77 y.o. male admitted 03/18/18 with weakness and SOB; worked up for CHF exacerbation. Pt also noted to be in a-fib with RVR, AKI on CKD III/IV, and with AMS. Blood culture grew Klebsiella ESBL on 10/31. PMH includes fem/pop (June 2019), cellulitis, CAD, s/p CABG, CHF, PAD, HTN, CKD.   PT Comments    Pt slowly progressing with mobility. Requires mod-maxA for bed mobility and standing; once up, able to take a few steps with RW and close min guard. Pt with decreased activity tolerance, easily fatigued, and not able to ambulate household distances. Pt plans to return home with assist from wife. Will require bariatric wheelchair for home and community use; pt currently declining hoyer lift. Wife able to participate with some transfer training. Discussed continued recommendation for SNF-level therapies to maximize functional mobility and decrease caregiver burden, but pt/wife still declining due to previous bad experience at SNF. If pt remains admitted, will continue to follow acutely.    SpO2 down to 89% on RA   Follow Up Recommendations  SNF;Supervision/Assistance - 24 hour(declined SNF; will need max HH services)     Equipment Recommendations  Other (comment)(bariatric wheelchair; 3-in-1 (declined hoyer lift))    Recommendations for Other Services       Precautions / Restrictions Precautions Precautions: Fall Restrictions Weight Bearing Restrictions: No    Mobility  Bed Mobility Overal bed mobility: Needs Assistance Bed Mobility: Supine to Sit     Supine to sit: Max assist;HOB elevated     General bed mobility comments: Pt with good ability to initiate BLE movement to EOB, requiring maxA to assist trunk elevation from sidelying; heavy reliance on bed rail. Wife present for educaiton regarding bed mobility, but did not  participate  Transfers Overall transfer level: Needs assistance Equipment used: Rolling walker (2 wheeled) Transfers: Sit to/from Stand Sit to Stand: Max assist         General transfer comment: MaxA to stand from bed with PT providing assist. Second trial performed from recliner, pt with better use of momentum and wife able to provide mod-maxA to stand; PT close for min guard. Cues throughout for proper technique and hand placement  Ambulation/Gait Ambulation/Gait assistance: Min guard Gait Distance (Feet): 3 Feet Assistive device: Rolling walker (2 wheeled) Gait Pattern/deviations: Step-through pattern;Decreased stride length;Trunk flexed;Wide base of support Gait velocity: Decreased Gait velocity interpretation: <1.31 ft/sec, indicative of household ambulator General Gait Details: Took steps from bed to recliner with RW and close min guard for balance; increased time and effort. Pt required seated rest break secondary to fatigue. Spo2 down to 89% on RA   Stairs             Wheelchair Mobility    Modified Rankin (Stroke Patients Only)       Balance Overall balance assessment: Needs assistance Sitting-balance support: Feet supported;Single extremity supported Sitting balance-Leahy Scale: Fair     Standing balance support: During functional activity Standing balance-Leahy Scale: Poor Standing balance comment: Reliant on UE support                            Cognition Arousal/Alertness: Awake/alert Behavior During Therapy: WFL for tasks assessed/performed Overall Cognitive Status: Impaired/Different from baseline Area of Impairment: Attention;Safety/judgement;Awareness;Problem solving  Current Attention Level: Selective     Safety/Judgement: Decreased awareness of safety;Decreased awareness of deficits Awareness: Emergent Problem Solving: Requires verbal cues General Comments: Poor insight into deficits. States "I'm ready  to walk in the hallway now" although had just stated he will not be able to walk bathroom distance at home. Potential baseline cognition/personality      Exercises      General Comments General comments (skin integrity, edema, etc.): Wife present throughout session; participated with transfer training, instructed on gait belt use (wife owns gait belts at home)      Pertinent Vitals/Pain Pain Assessment: 0-10 Pain Score: 8  Pain Location: Back Pain Descriptors / Indicators: Constant;Discomfort Pain Intervention(s): Monitored during session;Repositioned    Home Living                      Prior Function            PT Goals (current goals can now be found in the care plan section) Acute Rehab PT Goals Patient Stated Goal: Go home with assist from wife PT Goal Formulation: With patient/family Time For Goal Achievement: 04/06/18 Potential to Achieve Goals: Fair Progress towards PT goals: Progressing toward goals    Frequency    Min 3X/week      PT Plan Current plan remains appropriate    Co-evaluation              AM-PAC PT "6 Clicks" Daily Activity  Outcome Measure  Difficulty turning over in bed (including adjusting bedclothes, sheets and blankets)?: Unable Difficulty moving from lying on back to sitting on the side of the bed? : Unable   Help needed moving to and from a bed to chair (including a wheelchair)?: A Little Help needed walking in hospital room?: A Little Help needed climbing 3-5 steps with a railing? : Total 6 Click Score: 9    End of Session Equipment Utilized During Treatment: Gait belt;Oxygen Activity Tolerance: Patient limited by fatigue Patient left: in chair;with call bell/phone within reach;with chair alarm set;with family/visitor present Nurse Communication: Mobility status PT Visit Diagnosis: Unsteadiness on feet (R26.81);Other abnormalities of gait and mobility (R26.89);Muscle weakness (generalized) (M62.81);Repeated falls  (R29.6);Difficulty in walking, not elsewhere classified (R26.2)     Time: 5997-7414 PT Time Calculation (min) (ACUTE ONLY): 28 min  Charges:  $Therapeutic Activity: 8-22 mins $Self Care/Home Management: 8-22                     Mabeline Caras, PT, DPT Acute Rehabilitation Services  Pager [phone] Office [phone]  Derry Lory 03/29/2018, 9:38 AM

## 2018-03-29 NOTE — Telephone Encounter (Signed)
Left message for daughter to call back.  Advised the hospital team taking care of pt is another good source for any questions they may have.

## 2018-03-29 NOTE — Telephone Encounter (Signed)
New message   Patient's daughter  has questions about keeping her father out of the hospital. Patient has several questions to ask. Please advise.

## 2018-03-29 NOTE — Progress Notes (Signed)
Responded to PIV consult. RN in room. Pt currently refusing IV stick and removal of old PIV. Pulling arms away. Charge nurse present and recommended waiting until spouse arrives for further attempt.

## 2018-03-29 NOTE — Progress Notes (Signed)
[place] TEAM 1 - Stepdown/ICU TEAM  [name]  MWN:[id number] DOB: 26-Mar-1941 DOA: 03/18/2018 PCP: Lavone Orn, MD    Brief Narrative:  77 y.o. male with a hx of systolic heart failure, atrial flutter, recent GI bleed, CAD s/p CABG, hypothyroidism, DM, GERD, HTN, HLD, stage III/stage IV CKD, and PAD who was recently discharged from the hospital after a GI bleed who presented w/ generalized weakness, and inability to ambulate, and shortness of breath.    On presentation to the hospital, patient was noted to be in atrial fibrillation with rapid ventricular response, acute kidney injury on chronic kidney disease stage III/IV, acute on chronic combined systolic and diastolic congestive heart failure with recent echocardiogram revealing EF of 30 to 40%, grade 2 diastolic dysfunction and decreased right ventricular systolic function.  UA is suggestive of likely UTI and blood culture is growing Klebsiella pneumonia, suspect likely secondary to UTI/sepsis.  Worsening mental status is noted.  Sodium is 126.  Patient remains volume overloaded, despite being on high-dose Lasix.  03/23/2018: BUN is 120 and serum creatinine is 3.3, with baseline serum creatinine of 1.8 to 2.  Albumin is 1.9, AST of 394, ALT of 139.  Patient was seen alongside patient's nurse and wife.  Updated patient's wife extensively.  Patient could not participate reasonably with a consultation due to altered mentation, possibly secondary to uremia.  To date, no GI bleed noted.  Nephrology and cardiology team consulted.  Guarded/poor prognosis, communicated to the patient's wife.  Will consult palliative care service.  Further management will depend on hospital course.  03/24/2018: Patient seen alongside patient's wife, daughter, son and daughter-in-law.  Blood culture final result grew Klebsiella ESBL.  We change antibiotics from IV Rocephin to meropenem.  Patient's family is expecting Dr. Tamala Julian, patient's cardiologist to have an  input in goal of care discussion.  For now, will continue to treat patient.  Cardiology and nephrology input is highly appreciated.  Awaiting stool fecal occult blood to rule out occult GI bleed.  11//1/19: Patient actually looks much better today.  Patient is more awake.  Patient is more communicative.  Leg edema is improving.  Improvement in serum creatinine.  BUN remains at 122.  Stool for fecal occult blood when feasible.  Continue current management.  Continue IV meropenem as blood culture grew Klebsiella ESBL.  03/26/18: Patient seen alongside patient's wife.  Patient remains stable.  Improving renal function.  Potassium is 2.6 today, will replete.  We will continue current management.  03/27/18: Patient seen alongside patient's wife.  Patient continues to improve.  Patient is more awake and alert.  Patient is more interactive today.  03/29/18: Patient seen alongside patient's wife.  Patient continues to improve clinically.  Encephalopathy has resolved.  Volume overload process.  Patient still has significant lower extremity edema.  We will continue IV diuretics for now.  Improving.  BUN is down to 75, and his serum creatinine is 1.87.  Prognosis remains guarded.  Palliative care input is highly appreciated.  Subjective: No fever chills. No chest pain. No worsening of shortness of breath.  Assessment & Plan: Acute exacerbation of chronic combined systolic and diastolic CHF: -Consult heart failure team. -Query diuresis resistant. -BUN of 120, serum creatinine of 3.3, and with likely uremia - query role of hemodialysis, but patient may not be a good candidate due to multiple cold morbidities. -Latest echo revealed EF of 30 to 35% (kindly see above). - Await further recommendation from the heart failure team. -  Nephrology team also consulted.  -03/25/18: Continue current regimen.  Continue to monitor renal function and electrolytes.  Leg edema is improving.  Patient actually looks much better  today. -03/26/2018: Renal function is improving.  The BUN is down to 107 and serum creatinine is down to 2.47.  Continue current management. 03/27/2018: Decrease IV Lasix to 100 mg twice daily.  Continue to monitor renal function and electrolytes. 03/28/18: Continue current dosage of diuretics.  Continue to monitor renal function and electrolytes.  Atrial flutter/Atrial Fib with rapid ventricular response: Cardiology is directing care  Eliquis was held during recent admit for GIB, but resumed at time of d/c  S/P TEE DCCV  UTI/bacteremia/sepsis, likely secondary to Klebsiella: 03/24/2018: Blood culture final result revealed Klebsiella pneumonia, ESBL. Discontinued IV Rocephin. Continue IV meropenem. Complete course of IV meropenem (2 weeks course).  Hypoalbuminemia:  May be indicative of poor prognosis.    Acute kidney injury on chronic kidney disease stage III/IV:  Nephrology input is appreciated.  As per nephrology team, not ideal candidate for dialysis. We will continue to assess. 03/25/2018: Serum creatinine is trending downwards. 03/26/2018: Renal function is improving.  The BUN is down to 107 and serum creatinine is down to 2.47.  Continue current management. 03/27/2018: AKI continues to improve.  Monitor closely. 03/28/2018: BUN is 89 today and creatinine is 2.1.  Overall, long-term prognosis remains guarded.  Recent Labs  Lab 03/26/18 1059 03/26/18 1733 03/27/18 0341 03/28/18 0600 03/29/18 1019  CREATININE 2.47* 2.65* 2.49* 2.10* 1.87*     Hypokalemia: Potassium is 3 today Cautious replacement considering impaired renal function.  Hyponatremia: Resolved.  DM2: Continue to optimize.  HLD  Anemia of chronic kidney disease and recent GIB Follow trend - Hgb stable    GIB Oct 2019 EGD 03/10/18 w/o clear source of bleeding   CAD s/p CABG Has suspected graft failure - not a candidate for cardiac cath given kidney disease   Cystic lesion of pancreas Noted on MRI  abdom earlier this month - mildly elevated CA 19-9 not felt to be worrisome per GI   Consulted palliative team. Patient and family still want patient to be actively treated.   DVT prophylaxis: eliquis  Code Status: FULL CODE Family Communication: spoke wife.    Disposition Plan: Will depend on hospital course.    Consultants:  [facility] Heart failure team. Nephrology.  Antimicrobials:  IV Rocephin discontinued on 03/24/2018. IV meropenem  Objective: Blood pressure (!) 149/69, pulse (!) 58, temperature 97.8 F (36.6 C), temperature source Oral, resp. rate 20, weight 115.7 kg, SpO2 100 %.  Intake/Output Summary (Last 24 hours) at 03/29/2018 1713 Last data filed at 03/29/2018 1521 Gross per 24 hour  Intake 645.12 ml  Output 1875 ml  Net -1229.88 ml   Filed Weights   03/27/18 0513 03/28/18 0625 03/29/18 0423  Weight: 116.1 kg 115.8 kg 115.7 kg    Examination: General: Awake and communicative.    Lungs: Decreased air entry. Cardiovascular: S1-S2. Abdomen: Nontender, obese, soft, BS+, no rebound Extremities: ++ BLE edema   CBC: Recent Labs  Lab 03/23/18 0417 03/24/18 1019 03/25/18 1049  WBC 10.0 10.6* 8.5  NEUTROABS  --  9.0* 7.1  HGB 10.0* 10.3* 9.7*  HCT 31.1* 32.4* 31.0*  MCV 85.9 86.2 86.8  PLT 316 331 619   Basic Metabolic Panel: Recent Labs  Lab 03/25/18 1049 03/26/18 1059  03/27/18 0341 03/28/18 0600 03/29/18 1019  NA 129* 133*   < > 134* 136 139  K 4.4  2.6*   < > 3.0* 3.8 3.8  CL 89* 91*   < > 93* 97* 96*  CO2 27 32   < > 30 33* 35*  GLUCOSE 210* 167*   < > 144* 162* 149*  BUN 122* 107*   < > 100* 89* 75*  CREATININE 2.83* 2.47*   < > 2.49* 2.10* 1.87*  CALCIUM 8.4* 8.4*   < > 8.4* 8.4* 8.8*  MG 2.5* 2.4  --  2.2  --   --   PHOS 4.7* 4.1   < > 3.4 2.8 2.5   < > = values in this interval not displayed.   GFR: Estimated Creatinine Clearance: 42.1 mL/min (A) (by C-G formula based on SCr of 1.87 mg/dL (H)).  Liver Function Tests: Recent  Labs  Lab 03/23/18 0417 03/24/18 1019  03/26/18 1733 03/27/18 0341 03/28/18 0600 03/29/18 1019  AST 394* 168*  --   --   --   --   --   ALT 139* 119*  --   --   --   --   --   ALKPHOS 41 43  --   --   --   --   --   BILITOT 1.0 0.9  --   --   --   --   --   PROT 5.5* 5.4*  --   --   --   --   --   ALBUMIN 1.9* 2.0*   < > 2.2* 2.1* 2.1* 2.3*   < > = values in this interval not displayed.    Cardiac Enzymes: No results for input(s): CKTOTAL, CKMB, CKMBINDEX, TROPONINI in the last 168 hours.  HbA1C: Hgb A1c MFr Bld  Date/Time Value Ref Range Status  10/21/2017 12:33 PM 6.2 (H) 4.8 - 5.6 % Final    Comment:    (NOTE) Pre diabetes:          5.7%-6.4% Diabetes:              >6.4% Glycemic control for   <7.0% adults with diabetes   09/24/2011 05:50 AM 6.4 (H) <5.7 % Final    Comment:    (NOTE)                                                                       According to the ADA Clinical Practice Recommendations for 2011, when HbA1c is used as a screening test:  >=6.5%   Diagnostic of Diabetes Mellitus           (if abnormal result is confirmed) 5.7-6.4%   Increased risk of developing Diabetes Mellitus References:Diagnosis and Classification of Diabetes Mellitus,Diabetes TDDU,2025,42(HCWCB 1):S62-S69 and Standards of Medical Care in         Diabetes - 2011,Diabetes JSEG,3151,76 (Suppl 1):S11-S61.    CBG: Recent Labs  Lab 03/28/18 1604 03/28/18 2132 03/29/18 0744 03/29/18 1112 03/29/18 1613  GLUCAP 168* 121* 93 161* 90    Scheduled Meds: . amiodarone  200 mg Oral BID  . atorvastatin  20 mg Oral QPC supper  . carvedilol  3.125 mg Oral BID  . fenofibrate  54 mg Oral Daily  . gabapentin  100 mg Oral BID PC  . insulin aspart  0-20 Units Subcutaneous TID WC  .  insulin aspart  0-5 Units Subcutaneous QHS  . insulin glargine  8 Units Subcutaneous QHS  . levothyroxine  100 mcg Oral QAC breakfast  . pantoprazole  40 mg Oral Daily  . tamsulosin  0.4 mg Oral Q M,W,F    Continuous Infusions: . albumin human 12.5 g (03/29/18 1648)  . furosemide 100 mg (03/29/18 1010)  . meropenem (MERREM) IV 1 g (03/29/18 1007)     LOS: 11 days   Bonnell Public, MD [facility] Office  [phone] Pager - Text Page per Amion  If 7PM-7AM, please contact night-coverage per Amion 03/29/2018, 5:13 PM

## 2018-03-29 NOTE — Progress Notes (Signed)
Patient ID: [name], male   DOB: 1940/12/30, 76 y.o.   MRN: [id number]  This NP visited patient at the bedside as a follow up to last weeks GOCs meeting, for palliative needs and emotional support.  Patient is alert and oriented today but still with poor insight into the seriousness of his current medical situation.  He appears weak and pale.  Placed call to daughter Maretta Bees for continued conversation regarding goals of care, disposition, anticipatory care needs and options.  Detailed conversation regarding the difference between hospice and palliative services both inpatient and in the community.  Detailed conversation regarding home health services. Discussed the possibility of skilled nursing facility for short-term rehab versus direct disposition home.  Abby asks  to talk with hospice liaison again: I spoke to Venia Carbon and she plans on calling family today.  Is a difficult situation for the family.  They wished to honor the patient's wishes but recognize the complexity of nursing care needs that will be difficult to secure in the home.  We discussed out-of-pocket caregivers to supplement services.  Again discussed the patient's multiple comorbidities and high risk to decompensate and the importance of continued conversation with family and the medical providers regarding overall plan of care and treatment options,  ensuring decisions are within the context of the patients values and GOCs.  Questions and concerns addressed     Total time spent on the unit was 35 minutes  Greater than 50% of the time was spent in counseling and coordination of care  [name] NP  Palliative Medicine Team Team Phone # [phone] Pager [phone]

## 2018-03-29 NOTE — Progress Notes (Signed)
Occupational Therapy Treatment Patient Details Name: [name] MRN: [id number] DOB: [birth year] Today's Date: 03/29/2018    History of present illness Pt is a 76 y.o. male admitted 03/18/18 with weakness and SOB; worked up for CHF exacerbation. Pt also noted to be in a-fib with RVR, AKI on CKD III/IV, and with AMS. Blood culture grew Klebsiella ESBL on 10/31. PMH includes fem/pop (June 2019), cellulitis, CAD, s/p CABG, CHF, PAD, HTN, CKD.   OT comments  Wife and Ot speaking regarding bathroom transfer. Wife plans to stand patient leave him standing push the w/c through the door way and then have him sit in the chair. Pt does not currently demonstrate the ability to static stand without physical (A). Family does have BSC at home and recommend this at this time. Utilized elongated 3n1 bari in the bathroom with a direct transfer from the stedy. Wife reports that "I feel like he has more time and that he can do it at home."   Follow Up Recommendations  SNF    Equipment Recommendations       Recommendations for Other Services      Precautions / Restrictions Precautions Precautions: Fall Precaution Comments: oxygen       Mobility Bed Mobility               General bed mobility comments: in chair on arrival  Transfers Overall transfer level: Needs assistance   Transfers: Sit to/from Stand Sit to Stand: +2 physical assistance;Max assist         General transfer comment: pt requires (A) to power up into standing and (A) to sustain static standing.     Balance Overall balance assessment: Needs assistance Sitting-balance support: Bilateral upper extremity supported;Feet supported Sitting balance-Leahy Scale: Fair     Standing balance support: Bilateral upper extremity supported;During functional activity Standing balance-Leahy Scale: Poor Standing balance comment: reliance on UB                           ADL either performed or assessed with clinical  judgement   ADL Overall ADL's : Needs assistance/impaired                         Toilet Transfer: +2 for physical assistance;+2 for safety/equipment;Maximal assistance;BSC(stedy)   Toileting- Clothing Manipulation and Hygiene: Total assistance         General ADL Comments: pt requires sara stedy for sit<.stand from chair and BSC. pt voiding bowel and sample provided to RN     Vision       Perception     Praxis      Cognition Arousal/Alertness: Awake/alert Behavior During Therapy: Impulsive Overall Cognitive Status: Impaired/Different from baseline Area of Impairment: Safety/judgement;Awareness                         Safety/Judgement: Decreased awareness of safety;Decreased awareness of deficits Awareness: Emergent   General Comments: pt reports "okay am i good to walk to the bathroom now" pt has not walked to the bathroom at any point with staff and has balance defciits requiring a stedy        Exercises     Shoulder Instructions       General Comments      Pertinent Vitals/ Pain       Pain Assessment: Faces Faces Pain Scale: Hurts little more Pain Location: general moans with movement - at the  end of session says "you did good girl "  Pain Descriptors / Indicators: Moaning Pain Intervention(s): Monitored during session;Repositioned  Home Living                                          Prior Functioning/Environment              Frequency  Min 2X/week        Progress Toward Goals  OT Goals(current goals can now be found in the care plan section)  Progress towards OT goals: Progressing toward goals  Acute Rehab OT Goals Patient Stated Goal: Go home with assist from wife OT Goal Formulation: With patient Time For Goal Achievement: 04/06/18 Potential to Achieve Goals: Fair ADL Goals Pt Will Perform Grooming: with min assist Pt Will Perform Upper Body Bathing: with mod assist Pt Will Perform Upper Body  Dressing: with mod assist Pt Will Perform Lower Body Dressing: with modified independence;sit to/from stand Pt Will Transfer to Toilet: with modified independence;ambulating;bedside commode Pt Will Perform Toileting - Clothing Manipulation and hygiene: with modified independence;sit to/from stand Additional ADL Goal #1: pt will roll to bil sides with mod A for adls Additional ADL Goal #2: pt will sit EOB x 5 minutes in preparation for adls with min A  Plan Discharge plan remains appropriate    Co-evaluation                 AM-PAC PT "6 Clicks" Daily Activity     Outcome Measure   Help from another person eating meals?: A Lot Help from another person taking care of personal grooming?: A Lot Help from another person toileting, which includes using toliet, bedpan, or urinal?: Total Help from another person bathing (including washing, rinsing, drying)?: A Lot Help from another person to put on and taking off regular upper body clothing?: A Lot Help from another person to put on and taking off regular lower body clothing?: A Lot 6 Click Score: 11    End of Session Equipment Utilized During Treatment: Gait belt  OT Visit Diagnosis: Muscle weakness (generalized) (M62.81) Pain - Right/Left: Left Pain - part of body: Ankle and joints of foot   Activity Tolerance Patient tolerated treatment well   Patient Left in chair;with call bell/phone within reach;with family/visitor present   Nurse Communication Mobility status;Precautions        Time: 4010-2725 OT Time Calculation (min): 31 min  Charges: OT General Charges $OT Visit: 1 Visit OT Treatments $Self Care/Home Management : 23-37 mins   Jeri Modena, OTR/L  Acute Rehabilitation Services Pager: [phone] Office: [phone] .    Parke Poisson B 03/29/2018, 3:01 PM

## 2018-03-29 NOTE — Progress Notes (Signed)
Pts old PIV needs to be removed, called IV team for new PIV placement. pt refused new PIV placement and old PIV removal. Pt pulling arms away and taking off tourniquet. Notified charge nurse notified, will wait till spouse arrives for new PIV placement and old PIV removal.

## 2018-03-30 ENCOUNTER — Other Ambulatory Visit (HOSPITAL_COMMUNITY): Payer: Medicare HMO

## 2018-03-30 ENCOUNTER — Inpatient Hospital Stay (HOSPITAL_COMMUNITY): Payer: Medicare HMO

## 2018-03-30 DIAGNOSIS — I340 Nonrheumatic mitral (valve) insufficiency: Secondary | ICD-10-CM

## 2018-03-30 DIAGNOSIS — I361 Nonrheumatic tricuspid (valve) insufficiency: Secondary | ICD-10-CM

## 2018-03-30 LAB — GLUCOSE, CAPILLARY
GLUCOSE-CAPILLARY: 171 mg/dL — AB (ref 70–99)
Glucose-Capillary: 113 mg/dL — ABNORMAL HIGH (ref 70–99)
Glucose-Capillary: 117 mg/dL — ABNORMAL HIGH (ref 70–99)
Glucose-Capillary: 161 mg/dL — ABNORMAL HIGH (ref 70–99)

## 2018-03-30 LAB — RENAL FUNCTION PANEL
Albumin: 3.1 g/dL — ABNORMAL LOW (ref 3.5–5.0)
Anion gap: 10 (ref 5–15)
BUN: 63 mg/dL — ABNORMAL HIGH (ref 8–23)
CO2: 34 mmol/L — ABNORMAL HIGH (ref 22–32)
Calcium: 9.1 mg/dL (ref 8.9–10.3)
Chloride: 94 mmol/L — ABNORMAL LOW (ref 98–111)
Creatinine, Ser: 1.62 mg/dL — ABNORMAL HIGH (ref 0.61–1.24)
GFR calc Af Amer: 45 mL/min — ABNORMAL LOW (ref >60)
GFR calc non Af Amer: 39 mL/min — ABNORMAL LOW (ref >60)
Glucose, Bld: 121 mg/dL — ABNORMAL HIGH (ref 70–99)
Phosphorus: 2.6 mg/dL (ref 2.5–4.6)
Potassium: 3.4 mmol/L — ABNORMAL LOW (ref 3.5–5.1)
Sodium: 138 mmol/L (ref 135–145)

## 2018-03-30 LAB — ECHOCARDIOGRAM LIMITED: Weight: 3979.2 oz

## 2018-03-30 MED ORDER — POTASSIUM CHLORIDE CRYS ER 20 MEQ PO TBCR
40.0000 meq | EXTENDED_RELEASE_TABLET | Freq: Once | ORAL | Status: AC
  Administered 2018-03-30: 40 meq via ORAL
  Filled 2018-03-30: qty 2

## 2018-03-30 MED ORDER — PERFLUTREN LIPID MICROSPHERE
1.0000 mL | INTRAVENOUS | Status: AC | PRN
  Administered 2018-03-30: 2 mL via INTRAVENOUS
  Filled 2018-03-30: qty 10

## 2018-03-30 NOTE — Progress Notes (Signed)
Pt offered BIPAP and pt states he does not need or want it. RT will continue to monitor.

## 2018-03-30 NOTE — Progress Notes (Signed)
Echocardiogram Limited 2D Echocardiogram with Definity has been performed.  03/30/2018 3:47 PM Maudry Mayhew, MHA, RVT, RDCS, RDMS

## 2018-03-30 NOTE — Progress Notes (Signed)
Physical Therapy Treatment Patient Details Name: [name] MRN: [id number] DOB: February 06, 1941 Today's Date: 03/30/2018    History of Present Illness Pt is a 77 y.o. male admitted 03/18/18 with weakness and SOB; worked up for CHF exacerbation. Pt also noted to be in a-fib with RVR, AKI on CKD III/IV, and with AMS. Blood culture grew Klebsiella ESBL on 10/31. PMH includes fem/pop (June 2019), cellulitis, CAD, s/p CABG, CHF, PAD, HTN, CKD.   PT Comments    Pt slowly progressing with mobility. Remains limited by pain, fatigue, weakness and decreased activity tolerance. MaxA for bed mobility and standing with RW; able to take steps with RW and minA. Increased time spent discussing continued recommendation for SNF-level therapies prior to return home; wife in agreement, "I cannot take him home if he cannot walk." Per OT, family now requesting CIR consult. Will continue to follow acutely.    Follow Up Recommendations  SNF;Supervision/Assistance - 24 hour(OT contacted regarding CIR per wife request)     Equipment Recommendations  (bariatric w/c, 3-in-1; declined hoyer lift)    Recommendations for Other Services       Precautions / Restrictions Precautions Precautions: Fall Restrictions Weight Bearing Restrictions: No    Mobility  Bed Mobility Overal bed mobility: Needs Assistance Bed Mobility: Sit to Supine       Sit to supine: Max assist   General bed mobility comments: MaxA to assist BLEs into supine  Transfers Overall transfer level: Needs assistance Equipment used: Rolling walker (2 wheeled) Transfers: Sit to/from Stand Sit to Stand: Max assist         General transfer comment: MaxA for first standing trial; pt unable to achieve fully upright posture, requiring maxA to control descent back to sitting on chair. Once scooted forward to edge, able to stand with maxA and maintain standing with RW and minA  Ambulation/Gait Ambulation/Gait assistance: Min assist Gait  Distance (Feet): 2 Feet Assistive device: Rolling walker (2 wheeled) Gait Pattern/deviations: Step-to pattern;Trunk flexed Gait velocity: Decreased Gait velocity interpretation: <1.31 ft/sec, indicative of household ambulator General Gait Details: Took steps from recliner to bed with RW and minA to maintain balance; slow, labored amb with intermittent cues for sequencing/safety. DOE 3/4   Stairs             Wheelchair Mobility    Modified Rankin (Stroke Patients Only)       Balance Overall balance assessment: Needs assistance Sitting-balance support: Bilateral upper extremity supported;Feet supported Sitting balance-Leahy Scale: Fair     Standing balance support: Bilateral upper extremity supported;During functional activity Standing balance-Leahy Scale: Poor Standing balance comment: Heavy reliance on BUE support                            Cognition Arousal/Alertness: Awake/alert Behavior During Therapy: Impulsive Overall Cognitive Status: Impaired/Different from baseline Area of Impairment: Safety/judgement;Awareness;Attention;Problem solving                   Current Attention Level: Selective     Safety/Judgement: Decreased awareness of safety;Decreased awareness of deficits Awareness: Emergent Problem Solving: Requires verbal cues;Slow processing General Comments: Pt with increased stuttering noted/difficulty getting words out; wife reports this is typical during mornings. After transfer with RW, pt stating, "I wish you had let me use a walker"... reoriented to the fact that he used one. Per wife, she states cognition is usually "on and off" like this      Exercises  General Comments General comments (skin integrity, edema, etc.): Wife present; spent time discussing SNF recommendation. Wife seems to agree, states, "I can't take him home if he cannot walk."      Pertinent Vitals/Pain Pain Assessment: Faces Faces Pain Scale: Hurts  even more Pain Location: "Where doesn't it hurt" Pain Descriptors / Indicators: Moaning Pain Intervention(s): Repositioned;Monitored during session    Home Living                      Prior Function            PT Goals (current goals can now be found in the care plan section) Acute Rehab PT Goals Patient Stated Goal: Go home with assist from wife PT Goal Formulation: With patient/family Time For Goal Achievement: 04/06/18 Potential to Achieve Goals: Fair Progress towards PT goals: Not progressing toward goals - comment(Limited by fatigue)    Frequency    Min 3X/week      PT Plan Current plan remains appropriate    Co-evaluation              AM-PAC PT "6 Clicks" Daily Activity  Outcome Measure  Difficulty turning over in bed (including adjusting bedclothes, sheets and blankets)?: Unable Difficulty moving from lying on back to sitting on the side of the bed? : Unable Difficulty sitting down on and standing up from a chair with arms (e.g., wheelchair, bedside commode, etc,.)?: Unable Help needed moving to and from a bed to chair (including a wheelchair)?: A Little Help needed walking in hospital room?: A Little Help needed climbing 3-5 steps with a railing? : Total 6 Click Score: 10    End of Session Equipment Utilized During Treatment: Gait belt Activity Tolerance: Patient limited by fatigue Patient left: in bed;with bed alarm set;with family/visitor present Nurse Communication: Mobility status PT Visit Diagnosis: Unsteadiness on feet (R26.81);Other abnormalities of gait and mobility (R26.89);Muscle weakness (generalized) (M62.81);Repeated falls (R29.6);Difficulty in walking, not elsewhere classified (R26.2)     Time: 8110-3159 PT Time Calculation (min) (ACUTE ONLY): 26 min  Charges:  $Therapeutic Activity: 23-37 mins                     Mabeline Caras, PT, DPT Acute Rehabilitation Services  Pager [phone] Office [phone]  Derry Lory 03/30/2018, 11:27 AM

## 2018-03-30 NOTE — Progress Notes (Signed)
[place] TEAM 1 - Stepdown/ICU TEAM  [name]  ALP:[id number] DOB: May 12, 1941 DOA: 03/18/2018 PCP: Lavone Orn, MD    Brief Narrative:  77 y.o. male with a hx of systolic heart failure, atrial flutter, recent GI bleed, CAD s/p CABG, hypothyroidism, DM, GERD, HTN, HLD, stage III/stage IV CKD, and PAD who was recently discharged from the hospital after a GI bleed who presented w/ generalized weakness, and inability to ambulate, and shortness of breath.    On presentation to the hospital, patient was noted to be in atrial fibrillation with rapid ventricular response, acute kidney injury on chronic kidney disease stage III/IV, acute on chronic combined systolic and diastolic congestive heart failure with recent echocardiogram revealing EF of 30 to 35%, grade 2 diastolic dysfunction and decreased right ventricular systolic function.  UA is suggestive of likely UTI and blood culture is growing Klebsiella pneumonia, suspect likely secondary to UTI/sepsis.  Worsening mental status is noted.  Sodium is 126.  Patient remains volume overloaded, despite being on high-dose Lasix.  03/23/2018: BUN is 120 and serum creatinine is 3.3, with baseline serum creatinine of 1.8 to 2.  Albumin is 1.9, AST of 394, ALT of 139.  Patient was seen alongside patient's nurse and wife.  Updated patient's wife extensively.  Patient could not participate reasonably with a consultation due to altered mentation, possibly secondary to uremia.  To date, no GI bleed noted.  Nephrology and cardiology team consulted.  Guarded/poor prognosis, communicated to the patient's wife.  Will consult palliative care service.  Further management will depend on hospital course.  03/24/2018: Patient seen alongside patient's wife, daughter, son and daughter-in-law.  Blood culture final result grew Klebsiella ESBL.  We change antibiotics from IV Rocephin to meropenem.  Patient's family is expecting Dr. Tamala Julian, patient's cardiologist to have an  input in goal of care discussion.  For now, will continue to treat patient.  Cardiology and nephrology input is highly appreciated.  Awaiting stool fecal occult blood to rule out occult GI bleed.  11//1/19: Patient actually looks much better today.  Patient is more awake.  Patient is more communicative.  Leg edema is improving.  Improvement in serum creatinine.  BUN remains at 122.  Stool for fecal occult blood when feasible.  Continue current management.  Continue IV meropenem as blood culture grew Klebsiella ESBL.  03/26/18: Patient seen alongside patient's wife.  Patient remains stable.  Improving renal function.  Potassium is 2.6 today, will replete.  We will continue current management.  03/27/18: Patient seen alongside patient's wife.  Patient continues to improve.  Patient is more awake and alert.  Patient is more interactive today.  03/29/18: Patient seen alongside patient's wife.  Patient continues to improve clinically.  Encephalopathy has resolved.  Volume overload process.  Patient still has significant lower extremity edema.  We will continue IV diuretics for now.  Improving.  BUN is down to 75, and his serum creatinine is 1.87.  Prognosis remains guarded.  Palliative care input is highly appreciated.  03/30/2018: Patient seen alongside patient's wife.  BUN and creatinine continues to improve.  BUN is 63 and creatinine is 1.62.  We will continue diuresis.  Will complete course of antibiotics.  Further course will depend on hospital course.  Subjective: No fever chills. No chest pain. No worsening of shortness of breath.  Assessment & Plan: Acute exacerbation of chronic combined systolic and diastolic CHF: -Consult heart failure team. -Query diuresis resistant. -BUN of 120, serum creatinine of 3.3, and with  likely uremia - query role of hemodialysis, but patient may not be a good candidate due to multiple cold morbidities. -Latest echo revealed EF of 30 to 35% (kindly see above). -  Await further recommendation from the heart failure team. - Nephrology team also consulted.  -03/25/18: Continue current regimen.  Continue to monitor renal function and electrolytes.  Leg edema is improving.  Patient actually looks much better today. -03/26/2018: Renal function is improving.  The BUN is down to 107 and serum creatinine is down to 2.47.  Continue current management. 03/27/2018: Decrease IV Lasix to 100 mg twice daily.  Continue to monitor renal function and electrolytes. 03/28/18: Continue current dosage of diuretics.  Continue to monitor renal function and electrolytes.  Atrial flutter/Atrial Fib with rapid ventricular response: Cardiology is directing care  Eliquis was held during recent admit for GIB, but resumed at time of d/c  S/P TEE DCCV  UTI/bacteremia/sepsis, likely secondary to Klebsiella: 03/24/2018: Blood culture final result revealed Klebsiella pneumonia, ESBL. Discontinued IV Rocephin. Continue IV meropenem. Complete course of IV meropenem (2 weeks course).  Hypoalbuminemia:  May be indicative of poor prognosis.    Acute kidney injury on chronic kidney disease stage III/IV:  Nephrology input is appreciated.  As per nephrology team, not ideal candidate for dialysis. We will continue to assess. 03/25/2018: Serum creatinine is trending downwards. 03/26/2018: Renal function is improving.  The BUN is down to 107 and serum creatinine is down to 2.47.  Continue current management. 03/27/2018: AKI continues to improve.  Monitor closely. 03/28/2018: BUN is 89 today and creatinine is 2.1.  Overall, long-term prognosis remains guarded.  Recent Labs  Lab 03/26/18 1733 03/27/18 0341 03/28/18 0600 03/29/18 1019 03/30/18 0841  CREATININE 2.65* 2.49* 2.10* 1.87* 1.62*     Hypokalemia: Potassium is 3 today Cautious replacement considering impaired renal function.  Hyponatremia: Resolved.  DM2: Continue to optimize.  HLD  Anemia of chronic kidney disease and  recent GIB Follow trend - Hgb stable    GIB Oct 2019 EGD 03/10/18 w/o clear source of bleeding   CAD s/p CABG Has suspected graft failure - not a candidate for cardiac cath given kidney disease   Cystic lesion of pancreas Noted on MRI abdom earlier this month - mildly elevated CA 19-9 not felt to be worrisome per GI   Consulted palliative team. Patient and family still want patient to be actively treated.   DVT prophylaxis: eliquis  Code Status: FULL CODE Family Communication: spoke wife.    Disposition Plan: Will depend on hospital course.    Consultants:  [facility] Cardiology Heart failure team. Nephrology.  Antimicrobials:  IV Rocephin discontinued on 03/24/2018. IV meropenem  Objective: Blood pressure (!) 150/52, pulse (!) 57, temperature 97.7 F (36.5 C), temperature source Oral, resp. rate 20, weight 112.8 kg, SpO2 99 %.  Intake/Output Summary (Last 24 hours) at 03/30/2018 1345 Last data filed at 03/30/2018 1000 Gross per 24 hour  Intake 1530.04 ml  Output 1350 ml  Net 180.04 ml   Filed Weights   03/28/18 0625 03/29/18 0423 03/30/18 0450  Weight: 115.8 kg 115.7 kg 112.8 kg    Examination: General: Awake and communicative.    Lungs: Decreased air entry. Cardiovascular: S1-S2. Abdomen: Nontender, obese, soft, BS+, no rebound Extremities: ++ BLE edema   CBC: Recent Labs  Lab 03/24/18 1019 03/25/18 1049  WBC 10.6* 8.5  NEUTROABS 9.0* 7.1  HGB 10.3* 9.7*  HCT 32.4* 31.0*  MCV 86.2 86.8  PLT 331 367   Basic  Metabolic Panel: Recent Labs  Lab 03/25/18 1049 03/26/18 1059  03/27/18 0341 03/28/18 0600 03/29/18 1019 03/30/18 0841  NA 129* 133*   < > 134* 136 139 138  K 4.4 2.6*   < > 3.0* 3.8 3.8 3.4*  CL 89* 91*   < > 93* 97* 96* 94*  CO2 27 32   < > 30 33* 35* 34*  GLUCOSE 210* 167*   < > 144* 162* 149* 121*  BUN 122* 107*   < > 100* 89* 75* 63*  CREATININE 2.83* 2.47*   < > 2.49* 2.10* 1.87* 1.62*  CALCIUM 8.4* 8.4*   < > 8.4* 8.4* 8.8* 9.1  MG  2.5* 2.4  --  2.2  --   --   --   PHOS 4.7* 4.1   < > 3.4 2.8 2.5 2.6   < > = values in this interval not displayed.   GFR: Estimated Creatinine Clearance: 48 mL/min (A) (by C-G formula based on SCr of 1.62 mg/dL (H)).  Liver Function Tests: Recent Labs  Lab 03/24/18 1019  03/27/18 0341 03/28/18 0600 03/29/18 1019 03/30/18 0841  AST 168*  --   --   --   --   --   ALT 119*  --   --   --   --   --   ALKPHOS 43  --   --   --   --   --   BILITOT 0.9  --   --   --   --   --   PROT 5.4*  --   --   --   --   --   ALBUMIN 2.0*   < > 2.1* 2.1* 2.3* 3.1*   < > = values in this interval not displayed.    Cardiac Enzymes: No results for input(s): CKTOTAL, CKMB, CKMBINDEX, TROPONINI in the last 168 hours.  HbA1C: Hgb A1c MFr Bld  Date/Time Value Ref Range Status  10/21/2017 12:33 PM 6.2 (H) 4.8 - 5.6 % Final    Comment:    (NOTE) Pre diabetes:          5.7%-6.4% Diabetes:              >6.4% Glycemic control for   <7.0% adults with diabetes   09/24/2011 05:50 AM 6.4 (H) <5.7 % Final    Comment:    (NOTE)                                                                       According to the ADA Clinical Practice Recommendations for 2011, when HbA1c is used as a screening test:  >=6.5%   Diagnostic of Diabetes Mellitus           (if abnormal result is confirmed) 5.7-6.4%   Increased risk of developing Diabetes Mellitus References:Diagnosis and Classification of Diabetes Mellitus,Diabetes XTKW,4097,35(HGDJM 1):S62-S69 and Standards of Medical Care in         Diabetes - 2011,Diabetes EQAS,3419,62 (Suppl 1):S11-S61.    CBG: Recent Labs  Lab 03/29/18 1112 03/29/18 1613 03/29/18 2112 03/30/18 0805 03/30/18 1237  GLUCAP 161* 90 129* 113* 171*    Scheduled Meds: . amiodarone  200 mg Oral BID  . atorvastatin  20 mg Oral  QPC supper  . carvedilol  3.125 mg Oral BID  . fenofibrate  54 mg Oral Daily  . gabapentin  100 mg Oral BID PC  . insulin aspart  0-20 Units Subcutaneous  TID WC  . insulin aspart  0-5 Units Subcutaneous QHS  . insulin glargine  8 Units Subcutaneous QHS  . levothyroxine  100 mcg Oral QAC breakfast  . pantoprazole  40 mg Oral Daily  . tamsulosin  0.4 mg Oral Q M,W,F   Continuous Infusions: . furosemide 100 mg (03/30/18 0812)  . meropenem (MERREM) IV 1 g (03/30/18 0534)     LOS: 12 days   Bonnell Public, MD [facility] Office  [phone] Pager - Text Page per Amion  If 7PM-7AM, please contact night-coverage per Amion 03/30/2018, 1:45 PM

## 2018-03-30 NOTE — Progress Notes (Signed)
Occupational Therapy Treatment Patient Details Name: [name] MRN: [id number] DOB: [birth year] Today's Date: 03/30/2018    History of present illness Pt is a 77 y.o. male admitted 03/18/18 with weakness and SOB; worked up for CHF exacerbation. Pt also noted to be in a-fib with RVR, AKI on CKD III/IV, and with AMS. Blood culture grew Klebsiella ESBL on 10/31. PMH includes fem/pop (June 2019), cellulitis, CAD, s/p CABG, CHF, PAD, HTN, CKD.   OT comments  Wife requesting CIR consult for d/c planning. Pt demonstrates max (A) transfer with wife eob to w/c and back to bed this session. The session lasted 45 minutes to complete basic transfer. Pt requires extended rest breaks. OT very open discussion with wife regarding real concerns for transfers with patient at home due to fatigue and needing total+2 (A) from surfaces. Do not recommend bathroom transfers but rather [facility] transfer at home setting. Wife reports that this session makes her hopeful for home but knows it will be hard. Pt calling wife at 3am and no recall of this event today. Pt fatigued and wanting to sleep this AM. Pt appears to have sleeping schedule reversed. Pt's response to this education is "well fix it."    Follow Up Recommendations  SNF    Equipment Recommendations  Wheelchair cushion (measurements OT);Wheelchair (measurements OT)(bariatric elongated 3n1 )    Recommendations for Other Services Rehab consult(wife requesting CIR )    Precautions / Restrictions Precautions Precautions: Fall Precaution Comments: oxy Restrictions Weight Bearing Restrictions: No       Mobility Bed Mobility Overal bed mobility: Needs Assistance Bed Mobility: Supine to Sit;Sit to Supine   Sidelying to sit: (with wife) Supine to sit: +2 for physical assistance;Mod assist Sit to supine: Mod assist   General bed mobility comments: pt requires total+2 for supine to sit but mod (A) for BIL LE for return to supine. pt has a bed hand rest  on one side at home. wife describes as "a loop"  Transfers Overall transfer level: Needs assistance Equipment used: Rolling walker (2 wheeled) Transfers: Sit to/from [facility] Sit to Stand: Max assist Stand pivot transfers: Mod assist       General transfer comment: pt requires x3 attempts to stand from bed with rest breaks. pt transferred to w/c that is very close to hte same as home w/c would be if issued. pt requires extended rest break then transfered back to bed with wife.     Balance Overall balance assessment: Needs assistance Sitting-balance support: Bilateral upper extremity supported;Feet supported Sitting balance-Leahy Scale: Fair     Standing balance support: Bilateral upper extremity supported;During functional activity Standing balance-Leahy Scale: Poor Standing balance comment: Heavy reliance on BUE support                           ADL either performed or assessed with clinical judgement   ADL Overall ADL's : Needs assistance/impaired                                       General ADL Comments: This session focused on basic transfer from bed to wheelchair with wife completing transfer. pt required extended time to arouse with wash cloth and bed re positioning. pt after (A)ed toward EOB with more arousal. Pt states "hey!" wife educated patient on the purpose and then patient agreeable. pt needs extensive (A) and  several attempts to achieve static standing. pt able to take steps to w/c with 5-8 minute rest break after the transfer. pt then able to progress back to bed level. wife wants to take patient home and now requesting inpatient rehab. pt and wife educated that 3 hours of therapy is required and that the length of stay is 7-14 days to reach goals. The patient is deconditioned and concerned for tolerance at this intensity. WIfe states "please make do... he has either always been too good or too bad. How much do you think it  will cost" pt educated that rehab coordinators can provided more detail if approved but at this time patient would need to be approved to have that discussion with a coordinator. pt will need to be able to transfer min guard (A) to allow wife to move the w/c  to d/c home safely. discussed a chair at the door to sit then w/c moved then transferred back to w/c. pt is advised to only use bariatric BSC and not attempt the bathroom transfer.      Vision       Perception     Praxis      Cognition Arousal/Alertness: Lethargic Behavior During Therapy: Flat affect Overall Cognitive Status: Impaired/Different from baseline Area of Impairment: Safety/judgement;Awareness;Attention;Problem solving                   Current Attention Level: Selective     Safety/Judgement: Decreased awareness of safety;Decreased awareness of deficits Awareness: Emergent Problem Solving: Slow processing;Decreased initiation General Comments: Pt with poor recall of calling wife at 3am and reports "i did not" . pts wife states "you called me at 3am: Pt seems shocked and did not deny the information at this point. pt with poor recall and needs cues at times. pt needs instructions repeated. Pt will state "now what are we doing" then after educated state "okay whats the plan here"        Exercises     Shoulder Instructions       General Comments Wife present; spent time discussing SNF recommendation. Wife seems to agree, states, "I can't take him home if he cannot walk."    Pertinent Vitals/ Pain       Pain Assessment: Faces Faces Pain Scale: Hurts even more Pain Location: "oh god" "Ohhh" generalized response Pain Descriptors / Indicators: Moaning Pain Intervention(s): Monitored during session;Repositioned  Home Living                                          Prior Functioning/Environment              Frequency  Min 2X/week        Progress Toward Goals  OT  Goals(current goals can now be found in the care plan section)  Progress towards OT goals: Progressing toward goals  Acute Rehab OT Goals Patient Stated Goal: Go home with assist from wife OT Goal Formulation: With patient Time For Goal Achievement: 04/06/18 Potential to Achieve Goals: Fair ADL Goals Pt Will Perform Grooming: with min assist Pt Will Perform Upper Body Bathing: with mod assist Pt Will Perform Upper Body Dressing: with mod assist Pt Will Perform Lower Body Dressing: with modified independence;sit to/from stand Pt Will Transfer to Toilet: with modified independence;ambulating;bedside commode Pt Will Perform Toileting - Clothing Manipulation and hygiene: with modified independence;sit to/from stand Additional ADL Goal #1:  pt will roll to bil sides with mod A for adls Additional ADL Goal #2: pt will sit EOB x 5 minutes in preparation for adls with min A  Plan Discharge plan remains appropriate    Co-evaluation                 AM-PAC PT "6 Clicks" Daily Activity     Outcome Measure   Help from another person eating meals?: A Lot Help from another person taking care of personal grooming?: A Lot Help from another person toileting, which includes using toliet, bedpan, or urinal?: Total Help from another person bathing (including washing, rinsing, drying)?: A Lot Help from another person to put on and taking off regular upper body clothing?: A Lot Help from another person to put on and taking off regular lower body clothing?: A Lot 6 Click Score: 11    End of Session Equipment Utilized During Treatment: Gait belt;Rolling walker  OT Visit Diagnosis: Muscle weakness (generalized) (M62.81) Pain - Right/Left: Left Pain - part of body: Ankle and joints of foot   Activity Tolerance Patient tolerated treatment well;Patient limited by fatigue   Patient Left in bed;with call bell/phone within reach;with family/visitor present   Nurse Communication Mobility  status;Precautions        Time: 2956-2130 OT Time Calculation (min): 44 min  Charges: OT General Charges $OT Visit: 1 Visit OT Treatments $Therapeutic Activity: 38-52 mins   Jeri Modena, OTR/L  Acute Rehabilitation Services Pager: [phone] Office: [phone] .    Parke Poisson B 03/30/2018, 11:39 AM

## 2018-03-31 LAB — RENAL FUNCTION PANEL
Albumin: 3.1 g/dL — ABNORMAL LOW (ref 3.5–5.0)
Anion gap: 10 (ref 5–15)
BUN: 59 mg/dL — ABNORMAL HIGH (ref 8–23)
CO2: 31 mmol/L (ref 22–32)
Calcium: 9.2 mg/dL (ref 8.9–10.3)
Chloride: 98 mmol/L (ref 98–111)
Creatinine, Ser: 1.62 mg/dL — ABNORMAL HIGH (ref 0.61–1.24)
GFR calc Af Amer: 45 mL/min — ABNORMAL LOW (ref >60)
GFR calc non Af Amer: 39 mL/min — ABNORMAL LOW (ref >60)
Glucose, Bld: 107 mg/dL — ABNORMAL HIGH (ref 70–99)
Phosphorus: 2.3 mg/dL — ABNORMAL LOW (ref 2.5–4.6)
Potassium: 3.9 mmol/L (ref 3.5–5.1)
Sodium: 139 mmol/L (ref 135–145)

## 2018-03-31 LAB — GLUCOSE, CAPILLARY
GLUCOSE-CAPILLARY: 105 mg/dL — AB (ref 70–99)
GLUCOSE-CAPILLARY: 128 mg/dL — AB (ref 70–99)
Glucose-Capillary: 109 mg/dL — ABNORMAL HIGH (ref 70–99)
Glucose-Capillary: 116 mg/dL — ABNORMAL HIGH (ref 70–99)

## 2018-03-31 MED ORDER — APIXABAN 5 MG PO TABS
5.0000 mg | ORAL_TABLET | Freq: Two times a day (BID) | ORAL | Status: DC
  Administered 2018-03-31 – 2018-04-03 (×7): 5 mg via ORAL
  Filled 2018-03-31 (×7): qty 1

## 2018-03-31 NOTE — Progress Notes (Signed)
RT offered pt BIPAP pt declined stating he does not need it at this time. RT will continue to monitor.

## 2018-03-31 NOTE — Progress Notes (Signed)
[facility] TEAM 1 - Stepdown/ICU TEAM  [name]  ZOX:[id number] DOB: [birth year] DOA: 03/18/2018 PCP: Lavone Orn, MD    Brief Narrative:  77 y.o. male with a hx of systolic heart failure, atrial flutter, recent GI bleed, CAD s/p CABG, hypothyroidism, DM, GERD, HTN, HLD, stage III/stage IV CKD, and PAD who was recently discharged from the hospital after a GI bleed who presented w/ generalized weakness, and inability to ambulate, and shortness of breath.    On presentation to the hospital, patient was noted to be in atrial fibrillation with rapid ventricular response, acute kidney injury on chronic kidney disease stage III/IV, acute on chronic combined systolic and diastolic congestive heart failure with recent echocardiogram revealing EF of 30 to 81%, grade 2 diastolic dysfunction and decreased right ventricular systolic function.  UA is suggestive of likely UTI and blood culture is growing Klebsiella pneumonia, suspect likely secondary to UTI/sepsis.  Worsening mental status is noted.  Sodium is 126.  Patient remains volume overloaded, despite being on high-dose Lasix.  03/31/2018: Patient seen alongside patient's wife.  Serum creatinine is stable at 1.62.  We will continue diuresis for now.  Will change IV Lasix from IV to oral torsemide to Bumex when serum creatinine starts to creep up.  No worsening shortness of breath.  However, patient appears dyspneic with minimal exertion.  No fever or chills, no chest pain.  Subjective: No fever chills. No chest pain. No worsening of shortness of breath.  Assessment & Plan: Acute exacerbation of chronic combined systolic and diastolic CHF: -Consult heart failure team. -Query diuresis resistant. -BUN of 120, serum creatinine of 3.3, and with likely uremia - query role of hemodialysis, but patient may not be a good candidate due to multiple cold morbidities. -Latest echo revealed EF of 30 to 35% (kindly see above). - Await further recommendation  from the heart failure team. - Nephrology team also consulted.  -03/31/18: Continue IV diuretics for now.  Continue to monitor renal function and electrolytes.  Case management/social work input appreciated regarding disposition.  We will continue to discuss disposition with patient and patient's family.   Atrial flutter/Atrial Fib with rapid ventricular response: Cardiology is directing care  Eliquis was held during recent admit for GIB, but resumed at time of d/c  S/P TEE DCCV  UTI/bacteremia/sepsis, likely secondary to Klebsiella: 03/24/2018: Blood culture final result revealed Klebsiella pneumonia, ESBL. Discontinued IV Rocephin. Continue IV meropenem. Complete course of IV meropenem (2 weeks course).  Hypoalbuminemia:  May be indicative of poor prognosis.    Acute kidney injury on chronic kidney disease stage III/IV:  Nephrology input is appreciated.  As per nephrology team, not ideal candidate for dialysis. We will continue to assess. 03/25/2018: Serum creatinine is trending downwards. 03/26/2018: Renal function is improving.  The BUN is down to 107 and serum creatinine is down to 2.47.  Continue current management. 03/27/2018: AKI continues to improve.  Monitor closely. 03/28/2018: BUN is 89 today and creatinine is 2.1.  Overall, long-term prognosis remains guarded.  Recent Labs  Lab 03/27/18 0341 03/28/18 0600 03/29/18 1019 03/30/18 0841 03/31/18 0342  CREATININE 2.49* 2.10* 1.87* 1.62* 1.62*     Hypokalemia: Potassium is 3.9 today Cautious replacement considering impaired renal function.  Hyponatremia: Resolved.  DM2: Continue to optimize.  HLD  Anemia of chronic kidney disease and recent GIB Follow trend - Hgb stable    GIB Oct 2019 EGD 03/10/18 w/o clear source of bleeding   CAD s/p CABG Has suspected graft failure -  not a candidate for cardiac cath given kidney disease   Cystic lesion of pancreas Noted on MRI abdom earlier this month - mildly  elevated CA 19-9 not felt to be worrisome per GI   Consulted palliative team. Patient and family still want patient to be actively treated.   DVT prophylaxis: eliquis  Code Status: FULL CODE Family Communication: spoke wife.    Disposition Plan: Will depend on hospital course.    Consultants:  [facility] Heart failure team. Nephrology.  Antimicrobials:  IV Rocephin discontinued on 03/24/2018. IV meropenem  Objective: Blood pressure (!) 148/69, pulse (!) 57, temperature 98.4 F (36.9 C), temperature source Oral, resp. rate 20, weight 114.7 kg, SpO2 91 %.  Intake/Output Summary (Last 24 hours) at 03/31/2018 1533 Last data filed at 03/31/2018 1500 Gross per 24 hour  Intake 180 ml  Output 1275 ml  Net -1095 ml   Filed Weights   03/29/18 0423 03/30/18 0450 03/31/18 0414  Weight: 115.7 kg 112.8 kg 114.7 kg    Examination: General: Awake and communicative.    Lungs: Decreased air entry. Cardiovascular: S1-S2. Abdomen: Nontender, obese, soft, BS+, no rebound Extremities: ++ BLE edema   CBC: Recent Labs  Lab 03/25/18 1049  WBC 8.5  NEUTROABS 7.1  HGB 9.7*  HCT 31.0*  MCV 86.8  PLT 211   Basic Metabolic Panel: Recent Labs  Lab 03/25/18 1049 03/26/18 1059  03/27/18 0341  03/29/18 1019 03/30/18 0841 03/31/18 0342  NA 129* 133*   < > 134*   < > 139 138 139  K 4.4 2.6*   < > 3.0*   < > 3.8 3.4* 3.9  CL 89* 91*   < > 93*   < > 96* 94* 98  CO2 27 32   < > 30   < > 35* 34* 31  GLUCOSE 210* 167*   < > 144*   < > 149* 121* 107*  BUN 122* 107*   < > 100*   < > 75* 63* 59*  CREATININE 2.83* 2.47*   < > 2.49*   < > 1.87* 1.62* 1.62*  CALCIUM 8.4* 8.4*   < > 8.4*   < > 8.8* 9.1 9.2  MG 2.5* 2.4  --  2.2  --   --   --   --   PHOS 4.7* 4.1   < > 3.4   < > 2.5 2.6 2.3*   < > = values in this interval not displayed.   GFR: Estimated Creatinine Clearance: 48.4 mL/min (A) (by C-G formula based on SCr of 1.62 mg/dL (H)).  Liver Function Tests: Recent Labs  Lab  03/28/18 0600 03/29/18 1019 03/30/18 0841 03/31/18 0342  ALBUMIN 2.1* 2.3* 3.1* 3.1*    Cardiac Enzymes: No results for input(s): CKTOTAL, CKMB, CKMBINDEX, TROPONINI in the last 168 hours.  HbA1C: Hgb A1c MFr Bld  Date/Time Value Ref Range Status  10/21/2017 12:33 PM 6.2 (H) 4.8 - 5.6 % Final    Comment:    (NOTE) Pre diabetes:          5.7%-6.4% Diabetes:              >6.4% Glycemic control for   <7.0% adults with diabetes   09/24/2011 05:50 AM 6.4 (H) <5.7 % Final    Comment:    (NOTE)  According to the ADA Clinical Practice Recommendations for 2011, when HbA1c is used as a screening test:  >=6.5%   Diagnostic of Diabetes Mellitus           (if abnormal result is confirmed) 5.7-6.4%   Increased risk of developing Diabetes Mellitus References:Diagnosis and Classification of Diabetes Mellitus,Diabetes POLI,1030,13(HYHOO 1):S62-S69 and Standards of Medical Care in         Diabetes - 2011,Diabetes ILNZ,9728,20 (Suppl 1):S11-S61.    CBG: Recent Labs  Lab 03/30/18 1237 03/30/18 1615 03/30/18 2049 03/31/18 0837 03/31/18 1207  GLUCAP 171* 117* 161* 105* 128*    Scheduled Meds: . amiodarone  200 mg Oral BID  . apixaban  5 mg Oral BID  . atorvastatin  20 mg Oral QPC supper  . carvedilol  3.125 mg Oral BID  . fenofibrate  54 mg Oral Daily  . gabapentin  100 mg Oral BID PC  . insulin aspart  0-20 Units Subcutaneous TID WC  . insulin aspart  0-5 Units Subcutaneous QHS  . insulin glargine  8 Units Subcutaneous QHS  . levothyroxine  100 mcg Oral QAC breakfast  . pantoprazole  40 mg Oral Daily  . tamsulosin  0.4 mg Oral Q M,W,F   Continuous Infusions: . furosemide 100 mg (03/31/18 1049)  . meropenem (MERREM) IV 1 g (03/31/18 0451)     LOS: 13 days   Bonnell Public, MD [facility] Office  [phone] Pager - Text Page per Amion  If 7PM-7AM, please contact night-coverage per  Amion 03/31/2018, 3:33 PM

## 2018-04-01 ENCOUNTER — Encounter (HOSPITAL_COMMUNITY): Payer: Medicare HMO | Admitting: Internal Medicine

## 2018-04-01 DIAGNOSIS — Z7189 Other specified counseling: Secondary | ICD-10-CM

## 2018-04-01 LAB — CBC WITH DIFFERENTIAL/PLATELET
Abs Immature Granulocytes: 0.05 10*3/uL (ref 0.00–0.07)
Basophils Absolute: 0 10*3/uL (ref 0.0–0.1)
Basophils Relative: 0 %
Eosinophils Absolute: 0.1 10*3/uL (ref 0.0–0.5)
Eosinophils Relative: 1 %
HCT: 30.8 % — ABNORMAL LOW (ref 39.0–52.0)
Hemoglobin: 9.5 g/dL — ABNORMAL LOW (ref 13.0–17.0)
Immature Granulocytes: 1 %
Lymphocytes Relative: 10 %
Lymphs Abs: 0.8 10*3/uL (ref 0.7–4.0)
MCH: 27.5 pg (ref 26.0–34.0)
MCHC: 30.8 g/dL (ref 30.0–36.0)
MCV: 89 fL (ref 80.0–100.0)
Monocytes Absolute: 0.7 10*3/uL (ref 0.1–1.0)
Monocytes Relative: 9 %
Neutro Abs: 6.2 10*3/uL (ref 1.7–7.7)
Neutrophils Relative %: 79 %
Platelets: 253 10*3/uL (ref 150–400)
RBC: 3.46 MIL/uL — ABNORMAL LOW (ref 4.22–5.81)
RDW: 17.5 % — ABNORMAL HIGH (ref 11.5–15.5)
WBC: 7.9 10*3/uL (ref 4.0–10.5)
nRBC: 0 % (ref 0.0–0.2)

## 2018-04-01 LAB — RENAL FUNCTION PANEL
Albumin: 2.9 g/dL — ABNORMAL LOW (ref 3.5–5.0)
Anion gap: 8 (ref 5–15)
BUN: 52 mg/dL — ABNORMAL HIGH (ref 8–23)
CO2: 33 mmol/L — ABNORMAL HIGH (ref 22–32)
Calcium: 9.2 mg/dL (ref 8.9–10.3)
Chloride: 99 mmol/L (ref 98–111)
Creatinine, Ser: 1.66 mg/dL — ABNORMAL HIGH (ref 0.61–1.24)
GFR calc Af Amer: 44 mL/min — ABNORMAL LOW (ref >60)
GFR calc non Af Amer: 38 mL/min — ABNORMAL LOW (ref >60)
Glucose, Bld: 89 mg/dL (ref 70–99)
Phosphorus: 2.3 mg/dL — ABNORMAL LOW (ref 2.5–4.6)
Potassium: 3.5 mmol/L (ref 3.5–5.1)
Sodium: 140 mmol/L (ref 135–145)

## 2018-04-01 LAB — GLUCOSE, CAPILLARY
GLUCOSE-CAPILLARY: 112 mg/dL — AB (ref 70–99)
Glucose-Capillary: 81 mg/dL (ref 70–99)
Glucose-Capillary: 162 mg/dL — ABNORMAL HIGH (ref 70–99)
Glucose-Capillary: 119 mg/dL — ABNORMAL HIGH (ref 70–99)

## 2018-04-01 LAB — MAGNESIUM: Magnesium: 2.1 mg/dL (ref 1.7–2.4)

## 2018-04-01 MED ORDER — POTASSIUM CHLORIDE CRYS ER 20 MEQ PO TBCR
20.0000 meq | EXTENDED_RELEASE_TABLET | Freq: Once | ORAL | Status: AC
  Administered 2018-04-01: 20 meq via ORAL
  Filled 2018-04-01: qty 1

## 2018-04-01 MED ORDER — HYDRALAZINE HCL 25 MG PO TABS
12.5000 mg | ORAL_TABLET | Freq: Three times a day (TID) | ORAL | Status: DC
  Administered 2018-04-01 – 2018-04-04 (×8): 12.5 mg via ORAL
  Filled 2018-04-01 (×7): qty 1

## 2018-04-01 MED ORDER — ISOSORBIDE MONONITRATE ER 30 MG PO TB24
30.0000 mg | ORAL_TABLET | Freq: Every day | ORAL | Status: DC
  Administered 2018-04-01 – 2018-04-06 (×6): 30 mg via ORAL
  Filled 2018-04-01 (×6): qty 1

## 2018-04-01 MED ORDER — AMIODARONE HCL 200 MG PO TABS
200.0000 mg | ORAL_TABLET | Freq: Every day | ORAL | Status: DC
  Administered 2018-04-02 – 2018-04-06 (×5): 200 mg via ORAL
  Filled 2018-04-01 (×5): qty 1

## 2018-04-01 MED ORDER — POTASSIUM CHLORIDE CRYS ER 20 MEQ PO TBCR
40.0000 meq | EXTENDED_RELEASE_TABLET | Freq: Once | ORAL | Status: AC
  Administered 2018-04-01: 40 meq via ORAL
  Filled 2018-04-01: qty 2

## 2018-04-01 MED ORDER — FUROSEMIDE 10 MG/ML IJ SOLN
120.0000 mg | Freq: Two times a day (BID) | INTRAVENOUS | Status: AC
  Administered 2018-04-01: 120 mg via INTRAVENOUS
  Filled 2018-04-01: qty 10

## 2018-04-01 MED ORDER — TORSEMIDE 20 MG PO TABS: 60.0000 mg | ORAL_TABLET | Freq: Two times a day (BID) | ORAL | Status: DC

## 2018-04-01 MED ORDER — METOLAZONE 5 MG PO TABS
2.5000 mg | ORAL_TABLET | Freq: Once | ORAL | Status: AC
  Administered 2018-04-01: 2.5 mg via ORAL
  Filled 2018-04-01: qty 1

## 2018-04-01 NOTE — Progress Notes (Signed)
[facility] 6E 16 - [facility] ([facility]) - RN Note   Notified by Hassan Rowan [facility]) of patient request for [facility] services at home.  Chart and patient information under review by [facility] physician -hospice eligibility pending at this time.   Spoke to daughter Maretta Bees) and wife [name]) and patient via speaker phone to confirm interest and discuss services. Answered questions and supported patient/family concerns. Patient and Family verbalized understanding of information given and were informed that a Hospital Liaison will follow up with them this weekend. Per discussion with family and patient, patient may have a possible cardiac catheterization scheduled for Monday with possible discharge date of next Tuesday 04/05/18.  Patient prefers not to ride in an ambulance asking if he can ride home in a wheelchair Lucianne Lei and will speak with the [facility] about transportation options this weekend. Discussed possible DME needs of hospital bed, Over the bed table and oxygen but family unsure at this time and will confirm equipment needs this weekend (or before discharge). Patient already has a bedside commode in the home. Per CMRN, a geriatric wheelchair has already been ordered through [facility].   Please call with any hospice related questions,  Thank you for this referral,  Gar Ponto, [facility] Liaison [place] are listed on AMION

## 2018-04-01 NOTE — Progress Notes (Signed)
Orthopedic Tech Progress Note Patient Details:  [name] March 05, 1941 [id number]  Ortho Devices Type of Ortho Device: Louretta Parma boot Ortho Device/Splint Interventions: Application   Post Interventions Patient Tolerated: Well Instructions Provided: Care of device, Adjustment of device   Melony Overly T 04/01/2018, 12:15 PM

## 2018-04-01 NOTE — Progress Notes (Signed)
[facility] TEAM 1 - Stepdown/ICU TEAM  [name]  [id number]:[id number] DOB: March 18, 1941 DOA: 03/18/2018 PCP: Lavone Orn, MD    Brief Narrative:  77 y.o. male with a hx of systolic heart failure, atrial flutter, recent GI bleed, CAD s/p CABG, hypothyroidism, DM, GERD, HTN, HLD, stage III/stage IV CKD, and PAD who was recently discharged from the hospital after a GI bleed who presented w/ generalized weakness, and inability to ambulate, and shortness of breath.    On presentation to the hospital, patient was noted to be in atrial fibrillation with rapid ventricular response, acute kidney injury on chronic kidney disease stage III/IV, acute on chronic combined systolic and diastolic congestive heart failure with recent echocardiogram revealing EF of 30 to 72%, grade 2 diastolic dysfunction and decreased right ventricular systolic function.  UA is suggestive of likely UTI and blood culture is growing Klebsiella pneumonia, suspect likely secondary to UTI/sepsis.  Worsening mental status is noted.  Sodium is 126.  Patient remains volume overloaded, despite being on high-dose Lasix.  03/31/2018: Patient seen alongside patient's wife.  Serum creatinine is stable at 1.62.  We will continue diuresis for now.  Will change IV Lasix from IV to oral torsemide to Bumex when serum creatinine starts to creep up.  No worsening shortness of breath.  However, patient appears dyspneic with minimal exertion.  No fever or chills, no chest pain.  04/01/2018: Heart failure team input is appreciated.  Patient's family is discussing with patient regarding disposition and goals of care.  Further management will depend on above.  Meanwhile, patient continues to remain fairly stable.  Long-term prognosis remains guarded.  Subjective: No fever chills. No chest pain. No worsening of shortness of breath.  Assessment & Plan: Acute exacerbation of chronic combined systolic and diastolic CHF: -Consult heart failure team. -Query  diuresis resistant. -BUN of 120, serum creatinine of 3.3, and with likely uremia - query role of hemodialysis, but patient may not be a good candidate due to multiple cold morbidities. -Latest echo revealed EF of 30 to 35% (kindly see above). - Await further recommendation from the heart failure team. - Nephrology team also consulted.  -03/31/18: Continue IV diuretics for now.  Continue to monitor renal function and electrolytes.  Case management/social work input appreciated regarding disposition.  We will continue to discuss disposition with patient and patient's family. 04/01/2018: Heart failure team is managing diuretics today.   Atrial flutter/Atrial Fib with rapid ventricular response: Cardiology is directing care  Eliquis was held during recent admit for GIB, but resumed at time of d/c  S/P TEE DCCV  UTI/bacteremia/sepsis, likely secondary to Klebsiella: 03/24/2018: Blood culture final result revealed Klebsiella pneumonia, ESBL. Discontinued IV Rocephin. Continue IV meropenem. Complete course of IV meropenem (2 weeks course).  Hypoalbuminemia:  May be indicative of poor prognosis.    Acute kidney injury on chronic kidney disease stage III/IV:  Nephrology input is appreciated.  As per nephrology team, not ideal candidate for dialysis. We will continue to assess. 03/25/2018: Serum creatinine is trending downwards. 03/26/2018: Renal function is improving.  The BUN is down to 107 and serum creatinine is down to 2.47.  Continue current management. 03/27/2018: AKI continues to improve.  Monitor closely. 03/28/2018: BUN is 89 today and creatinine is 2.1.  Overall, long-term prognosis remains guarded.  Recent Labs  Lab 03/28/18 0600 03/29/18 1019 03/30/18 0841 03/31/18 0342 04/01/18 0250  CREATININE 2.10* 1.87* 1.62* 1.62* 1.66*     Hypokalemia: Potassium is 3.5 today Cautious replacement considering  impaired renal function.  Hyponatremia: Resolved.  DM2: Continue to  optimize.  HLD  Anemia of chronic kidney disease and recent GIB Follow trend - Hgb stable    GIB Oct 2019 EGD 03/10/18 w/o clear source of bleeding   CAD s/p CABG Has suspected graft failure - not a candidate for cardiac cath given kidney disease   Cystic lesion of pancreas Noted on MRI abdom earlier this month - mildly elevated CA 19-9 not felt to be worrisome per GI   Consulted palliative team. Patient and family still want patient to be actively treated.   DVT prophylaxis: eliquis  Code Status: FULL CODE Family Communication: spoke wife.    Disposition Plan: Will depend on hospital course.    Consultants:  [facility] Heart failure team. Nephrology.  Antimicrobials:  IV Rocephin discontinued on 03/24/2018. IV meropenem  Objective: Blood pressure (!) 154/62, pulse 60, temperature 99 F (37.2 C), temperature source Oral, resp. rate 18, weight 111.9 kg, SpO2 100 %.  Intake/Output Summary (Last 24 hours) at 04/01/2018 1804 Last data filed at 04/01/2018 1300 Gross per 24 hour  Intake 1580.28 ml  Output 875 ml  Net 705.28 ml   Filed Weights   03/30/18 0450 03/31/18 0414 04/01/18 0344  Weight: 112.8 kg 114.7 kg 111.9 kg    Examination: General: Awake and communicative.    Lungs: Decreased air entry. Cardiovascular: S1-S2. Abdomen: Nontender, obese, soft, BS+, no rebound Extremities: ++ BLE edema   CBC: Recent Labs  Lab 04/01/18 0250  WBC 7.9  NEUTROABS 6.2  HGB 9.5*  HCT 30.8*  MCV 89.0  PLT 604   Basic Metabolic Panel: Recent Labs  Lab 03/26/18 1059  03/27/18 0341  03/30/18 0841 03/31/18 0342 04/01/18 0250  NA 133*   < > 134*   < > 138 139 140  K 2.6*   < > 3.0*   < > 3.4* 3.9 3.5  CL 91*   < > 93*   < > 94* 98 99  CO2 32   < > 30   < > 34* 31 33*  GLUCOSE 167*   < > 144*   < > 121* 107* 89  BUN 107*   < > 100*   < > 63* 59* 52*  CREATININE 2.47*   < > 2.49*   < > 1.62* 1.62* 1.66*  CALCIUM 8.4*   < > 8.4*   < > 9.1 9.2 9.2  MG 2.4  --   2.2  --   --   --  2.1  PHOS 4.1   < > 3.4   < > 2.6 2.3* 2.3*   < > = values in this interval not displayed.   GFR: Estimated Creatinine Clearance: 46.6 mL/min (A) (by C-G formula based on SCr of 1.66 mg/dL (H)).  Liver Function Tests: Recent Labs  Lab 03/29/18 1019 03/30/18 0841 03/31/18 0342 04/01/18 0250  ALBUMIN 2.3* 3.1* 3.1* 2.9*    Cardiac Enzymes: No results for input(s): CKTOTAL, CKMB, CKMBINDEX, TROPONINI in the last 168 hours.  HbA1C: Hgb A1c MFr Bld  Date/Time Value Ref Range Status  10/21/2017 12:33 PM 6.2 (H) 4.8 - 5.6 % Final    Comment:    (NOTE) Pre diabetes:          5.7%-6.4% Diabetes:              >6.4% Glycemic control for   <7.0% adults with diabetes   09/24/2011 05:50 AM 6.4 (H) <5.7 % Final  Comment:    (NOTE)                                                                       According to the ADA Clinical Practice Recommendations for 2011, when HbA1c is used as a screening test:  >=6.5%   Diagnostic of Diabetes Mellitus           (if abnormal result is confirmed) 5.7-6.4%   Increased risk of developing Diabetes Mellitus References:Diagnosis and Classification of Diabetes Mellitus,Diabetes WEXH,3716,96(VELFY 1):S62-S69 and Standards of Medical Care in         Diabetes - 2011,Diabetes BOFB,5102,58 (Suppl 1):S11-S61.    CBG: Recent Labs  Lab 03/31/18 1715 03/31/18 2215 04/01/18 0820 04/01/18 1112 04/01/18 1608  GLUCAP 109* 116* 81 112* 162*    Scheduled Meds: . [START ON 04/02/2018] amiodarone  200 mg Oral Daily  . apixaban  5 mg Oral BID  . atorvastatin  20 mg Oral QPC supper  . carvedilol  3.125 mg Oral BID  . fenofibrate  54 mg Oral Daily  . gabapentin  100 mg Oral BID PC  . hydrALAZINE  12.5 mg Oral Q8H  . insulin aspart  0-20 Units Subcutaneous TID WC  . insulin aspart  0-5 Units Subcutaneous QHS  . insulin glargine  8 Units Subcutaneous QHS  . isosorbide mononitrate  30 mg Oral Daily  . levothyroxine  100 mcg Oral  QAC breakfast  . pantoprazole  40 mg Oral Daily  . tamsulosin  0.4 mg Oral Q M,W,F   Continuous Infusions: . furosemide    . meropenem (MERREM) IV 1 g (04/01/18 1740)     LOS: 14 days   Bonnell Public, MD [facility] Office  [phone] Pager - Text Page per Amion  If 7PM-7AM, please contact night-coverage per Amion 04/01/2018, 6:04 PM

## 2018-04-01 NOTE — Progress Notes (Addendum)
    Durable Medical Equipment  (From admission, onward)         Start     Ordered   04/01/18 1203  For home use only DME wheelchair cushion (seat and back)  Once     04/01/18 1202         Pt will require wheelchair for navigation of household and community distances secondary to mobility impairments that require more than just RW use. Due to patient's body mass, will require a bariatric-sized wheelchair with pressure-relieving cushion .  Mabeline Caras, PT, DPT Acute Rehabilitation Services  Pager [phone] Office [phone]

## 2018-04-01 NOTE — Progress Notes (Signed)
Physical Therapy Treatment Patient Details Name: [name] MRN: [id number] DOB: January 09, 1941 Today's Date: 04/01/2018    History of Present Illness Pt is a 77 y.o. male admitted 03/18/18 with weakness and SOB; worked up for CHF exacerbation. Pt also noted to be in a-fib with RVR, AKI on CKD III/IV, and with AMS. S/p TEE/DCCV 10/29. Blood culture grew Klebsiella ESBL on 10/31. PMH includes fem/pop (June 2019), cellulitis, CAD, s/p CABG, CHF, PAD, HTN, CKD.   PT Comments    Today's session focused on d/c planning with pt's wife and children present to discuss follow-up recommendations, assist level, and DME needs. Although I believe the pt has rehab potential to improve functional mobility at SNF, I understand family's desire to have pt return home with palliative and [facility] services given pt's guarded prognosis. If pt to return home, will need physical assist for all mobility and ADLs. Family present for treatment session to observe that pt requires maxA to stand from bed/recliner and minA to walk short distance with RW; at significant fall risk. Family currently attempting to make arrangements to have necessary physical assist at home since the wife is not equipped to provide all that pt will need. If pt to return home, I also recommend HHPT/OT services to maximize functional mobility and decrease caregiver burden. If to remain admitted, will continue to follow acutely.   Follow Up Recommendations  Home with [facility] services and palliative/hospice (family undecided) vs. SNF     Equipment Recommendations  (hospital bed, bariatric wheelchair, bariatric 3-in-1)    Recommendations for Other Services       Precautions / Restrictions Precautions Precautions: Fall Precaution Comments: watch SpO2; unna boots Restrictions Weight Bearing Restrictions: No    Mobility  Bed Mobility               General bed mobility comments: Received sitting in recliner  Transfers Overall transfer level:  Needs assistance Equipment used: Rolling walker (2 wheeled) Transfers: Sit to/from Stand Sit to Stand: Max assist         General transfer comment: Heavy reliance on momentum to power into standing, still requiring maxA for trunk elevation and to maintain balance when transitioning BUEs from chair armrests to RW; max cues and assist to fully extend for upright posture. Intermittent knee instability  Ambulation/Gait Ambulation/Gait assistance: Min assist Gait Distance (Feet): 8 Feet Assistive device: Rolling walker (2 wheeled) Gait Pattern/deviations: Step-to pattern;Trunk flexed;Wide base of support Gait velocity: Decreased Gait velocity interpretation: <1.31 ft/sec, indicative of household ambulator General Gait Details: Amb 8' forwards then backwards with RW and minA for balance. DOE 3/4; Spo2 93% on [place]   Stairs             Wheelchair Mobility    Modified Rankin (Stroke Patients Only)       Balance Overall balance assessment: Needs assistance Sitting-balance support: Bilateral upper extremity supported;Feet supported Sitting balance-Leahy Scale: Fair     Standing balance support: Bilateral upper extremity supported;During functional activity Standing balance-Leahy Scale: Poor Standing balance comment: Heavy reliance on BUE support                            Cognition Arousal/Alertness: Awake/alert Behavior During Therapy: Flat affect Overall Cognitive Status: History of cognitive impairments - at baseline Area of Impairment: Safety/judgement;Awareness;Attention;Problem solving                   Current Attention Level: Selective  Safety/Judgement: Decreased awareness of safety;Decreased awareness of deficits Awareness: Emergent Problem Solving: Slow processing;Decreased initiation        Exercises      General Comments General comments (skin integrity, edema, etc.): Wife, daughter, and son present during session. Spent ~20  minutes in discussion with family prior to treatment session regarding recommendations for SNF vs. HHPT with palliative and assist; discussed DME and assist level needs. Family still undecided of d/c plan      Pertinent Vitals/Pain Pain Assessment: Faces Faces Pain Scale: Hurts little more Pain Location: Posterior legs Pain Descriptors / Indicators: Discomfort Pain Intervention(s): Monitored during session;Limited activity within patient's tolerance    Home Living                      Prior Function            PT Goals (current goals can now be found in the care plan section) Acute Rehab PT Goals Patient Stated Goal: Pt's goal is to return home asap  PT Goal Formulation: With patient/family Time For Goal Achievement: 04/06/18 Potential to Achieve Goals: Fair Progress towards PT goals: Progressing toward goals    Frequency    Min 3X/week      PT Plan Current plan remains appropriate    Co-evaluation              AM-PAC PT "6 Clicks" Daily Activity  Outcome Measure  Difficulty turning over in bed (including adjusting bedclothes, sheets and blankets)?: Unable Difficulty moving from lying on back to sitting on the side of the bed? : Unable Difficulty sitting down on and standing up from a chair with arms (e.g., wheelchair, bedside commode, etc,.)?: Unable Help needed moving to and from a bed to chair (including a wheelchair)?: A Little Help needed walking in hospital room?: A Little Help needed climbing 3-5 steps with a railing? : Total 6 Click Score: 10    End of Session Equipment Utilized During Treatment: Gait belt;Oxygen Activity Tolerance: Patient tolerated treatment well;Patient limited by fatigue Patient left: with family/visitor present;in chair;with call bell/phone within reach Nurse Communication: Mobility status PT Visit Diagnosis: Unsteadiness on feet (R26.81);Other abnormalities of gait and mobility (R26.89);Muscle weakness (generalized)  (M62.81);Repeated falls (R29.6);Difficulty in walking, not elsewhere classified (R26.2)     Time: 2376-2831 PT Time Calculation (min) (ACUTE ONLY): 40 min  Charges:  $Gait Training: 8-22 mins $Therapeutic Activity: 8-22 mins $Self Care/Home Management: [place], PT, DPT Acute Rehabilitation Services  Pager [phone] Office [phone]  Derry Lory 04/01/2018, 3:50 PM

## 2018-04-01 NOTE — Progress Notes (Addendum)
Advanced Heart Failure Rounding Note  PCP-Cardiologist: Sinclair Grooms, MD   Subjective:    Nephrology consulted 10/30, family chose to consult palliative instead of going forward with HD. Plan for limited code. Family has declined palliative and hospice care at this time with renal function improvement.   Creatinine has trended down on IV diuretics. Cr 1.66 today from peak of 3.17 this admission.   Asked to see again today with renal recovery and potential discharge soon.  He is awake and alert today. Compared to PTA, his wife states he is near his baseline, mentally, just very weak from being in the bed. They plan HHPT with palliative care as well. Had a bad SNF experience so would like to avoid if at all possible. Remains swollen into his thighs. Has been on high dose IV lasix (100 mg BID)  Weights inaccurate. Have been weighing part of the time in the bed.   WBC 7.9. Remains on Meropenem for Klebsiella bacteremia.   Objective:   Weight Range: 111.9 kg Body mass index is 34.42 kg/m.   Vital Signs:   Temp:  [98.3 F (36.8 C)-99.1 F (37.3 C)] 98.3 F (36.8 C) (11/08 0823) Pulse Rate:  [57-60] 58 (11/08 0823) Resp:  [20] 20 (11/08 0823) BP: (115-160)/(51-76) 157/59 (11/08 0823) SpO2:  [87 %-96 %] 96 % (11/08 0823) Weight:  [111.9 kg] 111.9 kg (11/08 0344) Last BM Date: 03/28/18(Per Pt)  Weight change: Filed Weights   03/30/18 0450 03/31/18 0414 04/01/18 0344  Weight: 112.8 kg 114.7 kg 111.9 kg    Intake/Output:   Intake/Output Summary (Last 24 hours) at 04/01/2018 1108 Last data filed at 04/01/2018 0616 Gross per 24 hour  Intake 1180.28 ml  Output 1425 ml  Net -244.72 ml    Physical Exam   General: Chronically ill appearing.  HEENT: Normal Neck: Supple. JVP to jaw. Carotids 2+ bilat; no bruits. No thyromegaly or nodule noted. Cor: PMI nondisplaced. RRR, Slightly brady.  Lungs: CTAB, normal effort. Abdomen: Soft, non-tender, non-distended, no HSM. No  bruits or masses. +BS  Extremities: No cyanosis, clubbing, or rash. 2-3+ edema into thighs.  Neuro: Alert & orientedx3, cranial nerves grossly intact. moves all 4 extremities w/o difficulty. Affect flat but appropriate.   Telemetry   NSR 50-60s, personally reviewed.   EKG    No new tracings.    Labs    CBC Recent Labs    04/01/18 0250  WBC 7.9  NEUTROABS 6.2  HGB 9.5*  HCT 30.8*  MCV 89.0  PLT 222   Basic Metabolic Panel Recent Labs    03/31/18 0342 04/01/18 0250  NA 139 140  K 3.9 3.5  CL 98 99  CO2 31 33*  GLUCOSE 107* 89  BUN 59* 52*  CREATININE 1.62* 1.66*  CALCIUM 9.2 9.2  MG  --  2.1  PHOS 2.3* 2.3*   Liver Function Tests Recent Labs    03/31/18 0342 04/01/18 0250  ALBUMIN 3.1* 2.9*   No results for input(s): LIPASE, AMYLASE in the last 72 hours. Cardiac Enzymes No results for input(s): CKTOTAL, CKMB, CKMBINDEX, TROPONINI in the last 72 hours.  BNP: BNP (last 3 results) Recent Labs    02/25/18 1340 03/18/18 1325 03/24/18 1019  BNP 1,809.7* 2,440.0* 1,916.1*    ProBNP (last 3 results) Recent Labs    02/03/18 1217 02/24/18 1243  PROBNP 10,666* 17,251*     D-Dimer No results for input(s): DDIMER in the last 72 hours. Hemoglobin A1C No results  for input(s): HGBA1C in the last 72 hours. Fasting Lipid Panel No results for input(s): CHOL, HDL, LDLCALC, TRIG, CHOLHDL, LDLDIRECT in the last 72 hours. Thyroid Function Tests No results for input(s): TSH, T4TOTAL, T3FREE, THYROIDAB in the last 72 hours.  Invalid input(s): FREET3  Other results:   Imaging    No results found.   Medications:     Scheduled Medications: . amiodarone  200 mg Oral BID  . apixaban  5 mg Oral BID  . atorvastatin  20 mg Oral QPC supper  . carvedilol  3.125 mg Oral BID  . fenofibrate  54 mg Oral Daily  . gabapentin  100 mg Oral BID PC  . insulin aspart  0-20 Units Subcutaneous TID WC  . insulin aspart  0-5 Units Subcutaneous QHS  . insulin  glargine  8 Units Subcutaneous QHS  . levothyroxine  100 mcg Oral QAC breakfast  . pantoprazole  40 mg Oral Daily  . potassium chloride  40 mEq Oral Once  . tamsulosin  0.4 mg Oral Q M,W,F  . [START ON 04/02/2018] torsemide  60 mg Oral BID    Infusions: . meropenem (MERREM) IV 1 g (04/01/18 0525)    PRN Medications: acetaminophen, camphor-menthol, hydrocortisone cream, nitroGLYCERIN, ondansetron (ZOFRAN) IV  Patient Profile   [name] is a 77 year old with a history of chronic combined systolic/diastolic heart failure, PAF, GI bleed, PAD, memory impairment, CAD, CABG, hypterlipidemia, CKD, DMII.   Admitted with increased fatigue and dyspnea.   Assessment/Plan   1. A/C Systolic Heart Failure - ICM, s/p CABG - Echo 03/30/18 EF 25-30% this admit with EF down to 30%. - Volume status remains elevated on exam.  - Will re-order for lasix 120 mg this evening with 2.5 metolazone. If renal function worsens or have difficult diuresing further, can consider RHC next week.  - Place unna boots. - Continue coreg 3.125 mg BID - Will add 12.5 mg Hydral and imdur 30 mg daily.  - No ace/arb/spiro/dig with AKI - He is not a candidate for advanced therapies with poor prognosis and co-morbidities.   2. A fib RVR S/P TEE/DC-CV - Maintaining sinus now.  - Decrease amiodarone to 200 mg daily.  - Continue Eliquis 5 mg BID.   3. AKI on CKD Stage III - Renal function has improved to 1.6 from peak of 3.33 this admit.  - Now palliative care following. Family does not wish for HD should the need arise again.   4. ID + Blood CX ---> klebsiella pneumoniae. Unclear source, but ? Leg wounds. - On meropenum.   5. Hyponatremia  - Na 140 today. Resolved.   6. Elevated LFTs - Trended down on last check 03/24/18. Suspect have continued to. No change.   7. CAD s/p CABG 2013. - Occasional chest pressure. No ischemic work up in chart since CABG.  - Would try and avoid cath for now unless  symptoms worsen given recent AKI and poor prognosis.  - Continue statin.   8. PAD  - S/P Aorto Bifem BPG 09/2017. No change.   9. Prognosis - Overall poor prognosis with patients rapid decline, deconditioning, and co-morbidities. Recommend palliative/hospice care at home, but family wish to hold off for now. Should continue palliative discussions.  - PT is recommending SNF, but family insistent on [facility] with PT and palliative care. I think this aligns well with his goals of care.   Medication concerns reviewed with patient and pharmacy team. Barriers identified: none.   Length of  Stay: [address] [place]  04/01/2018, 11:08 AM  Advanced Heart Failure Team Pager [phone] (M-F; 7a - 4p)  Please contact [facility] for night-coverage after hours (4p -7a ) and weekends on [email or web address]  Patient seen with PA, agree with the above note.   Patient has made progress since the last time we saw him, when the plan had been hospice/comfort care.  His renal function improved without dialysis.   He is still short of breath walking around the room.  No orthopnea if he is wearing oxygen.  He remains in NSR.   On exam, JVP 10 cm.  1-2+ edema to thighs.  Regular S1S2 w/o murmur.   1. Acute on chronic systolic CHF: Ischemic cardiomyopathy, TEE this admission with EF 30-35%, moderately decreased systolic function.   TTE with EF 25-30%.  Earlier in hospital stay, he had AKI and tentative plan had been for hospice/comfort care.  However, renal function has gradually improved and he is doing better though still with NYHA class IIIb symptoms. He is not a candidate for advanced therapies with age and renal dysfunction.  On exam, I think he still has volume overload.  Creatinine has plateaued in the 1.6 range.  - Give one more dose of Lasix 120 mg IV this evening along with a dose of metolazone 2.5 x 1.  Hopefully, he will diurese reasonably well with this, and anticipate switching him over to torsemide  60 mg po bid tomorrow.  - Continue Coreg 3.125 mg bid.  - Add hydralazine 12.5 mg tid + Imdur 30 mg daily for afterload reduction.  - No ACEi/ARNI/ARB/spironolactone/digoxin with AKI this admission and baseline CKD.  - Place Unna boots.  - If he has any downturn at this point, would do RHC to assess filling pressures and cardiac output (could consider palliative home [place]).  2. AKI on CKD stage 3-4: Earlier in hospitalization, he was uremic with BUN in 120s and creatinine > 3, but creatinine now down to 1.6.    - Follow carefully, 1 more day IV diuresis then to po.  3. Atrial fibrillation with RVR: Now in NSR s/p TEE-guided DCCV.  He is on amiodarone to maintain NSR and Eliquis.  - Continue Eliquis.  - Can decrease amiodarone to 200 mg daily.   4. CAD: S/p CABG in 2013.  No chest pain. He is on atorvastatin.  5. ID: Klebsiella bacteremia, ?source.  CXR did not show definite PNA.  He is on meropenem, per primary team. 6. PAD: Extensive history.  Has had aorto-bifemoral bypass, left common femoral endarterectomy with fem-pop bypass.   Plan at this time is to aim to get him home with palliative care services and home health.  Maybe over weekend, depending on clinic trajectory.   He will need close followup in CHF clinic, will arrange appt.   CRITICAL CARE Performed by: Loralie Champagne  Total critical care time: 40 minutes  Critical care time was exclusive of separately billable procedures and treating other patients.  Critical care was necessary to treat or prevent imminent or life-threatening deterioration.  Critical care was time spent personally by me on the following activities: development of treatment plan with patient and/or surrogate as well as nursing, discussions with consultants, evaluation of patient's response to treatment, examination of patient, obtaining history from patient or surrogate, ordering and performing treatments and interventions, ordering and review of laboratory  studies, ordering and review of radiographic studies, pulse oximetry and re-evaluation of patient's condition.  Bria Sparr [employer]  04/01/2018 12:03 PM

## 2018-04-02 LAB — RENAL FUNCTION PANEL
Albumin: 2.9 g/dL — ABNORMAL LOW (ref 3.5–5.0)
Anion gap: 5 (ref 5–15)
BUN: 47 mg/dL — ABNORMAL HIGH (ref 8–23)
CO2: 35 mmol/L — AB (ref 22–32)
Calcium: 9.1 mg/dL (ref 8.9–10.3)
Chloride: 98 mmol/L (ref 98–111)
Creatinine, Ser: 1.75 mg/dL — ABNORMAL HIGH (ref 0.61–1.24)
GFR calc Af Amer: 41 mL/min — ABNORMAL LOW (ref >60)
GFR calc non Af Amer: 36 mL/min — ABNORMAL LOW (ref >60)
GLUCOSE: 106 mg/dL — AB (ref 70–99)
POTASSIUM: 4.5 mmol/L (ref 3.5–5.1)
Phosphorus: 2.5 mg/dL (ref 2.5–4.6)
Sodium: 138 mmol/L (ref 135–145)

## 2018-04-02 LAB — CBC
HCT: 31 % — ABNORMAL LOW (ref 39.0–52.0)
Hemoglobin: 9.2 g/dL — ABNORMAL LOW (ref 13.0–17.0)
MCH: 26.8 pg (ref 26.0–34.0)
MCHC: 29.7 g/dL — ABNORMAL LOW (ref 30.0–36.0)
MCV: 90.4 fL (ref 80.0–100.0)
Platelets: 255 10*3/uL (ref 150–400)
RBC: 3.43 MIL/uL — ABNORMAL LOW (ref 4.22–5.81)
RDW: 17.6 % — ABNORMAL HIGH (ref 11.5–15.5)
WBC: 6.6 10*3/uL (ref 4.0–10.5)
nRBC: 0 % (ref 0.0–0.2)

## 2018-04-02 LAB — GLUCOSE, CAPILLARY
GLUCOSE-CAPILLARY: 122 mg/dL — AB (ref 70–99)
Glucose-Capillary: 96 mg/dL (ref 70–99)
Glucose-Capillary: 140 mg/dL — ABNORMAL HIGH (ref 70–99)
Glucose-Capillary: 139 mg/dL — ABNORMAL HIGH (ref 70–99)

## 2018-04-02 MED ORDER — FUROSEMIDE 10 MG/ML IJ SOLN
120.0000 mg | Freq: Two times a day (BID) | INTRAVENOUS | Status: AC
  Administered 2018-04-02: 120 mg via INTRAVENOUS
  Filled 2018-04-02: qty 10

## 2018-04-02 NOTE — Progress Notes (Signed)
Progress Note  Patient Name: [name] Date of Encounter: 04/02/2018  Primary Cardiologist:   Sinclair Grooms, MD   Subjective   No chest pain.  Still with SOB and leg swelling .    Inpatient Medications    Scheduled Meds: . amiodarone  200 mg Oral Daily  . apixaban  5 mg Oral BID  . atorvastatin  20 mg Oral QPC supper  . carvedilol  3.125 mg Oral BID  . fenofibrate  54 mg Oral Daily  . gabapentin  100 mg Oral BID PC  . hydrALAZINE  12.5 mg Oral Q8H  . insulin aspart  0-20 Units Subcutaneous TID WC  . insulin aspart  0-5 Units Subcutaneous QHS  . insulin glargine  8 Units Subcutaneous QHS  . isosorbide mononitrate  30 mg Oral Daily  . levothyroxine  100 mcg Oral QAC breakfast  . pantoprazole  40 mg Oral Daily  . tamsulosin  0.4 mg Oral Q M,W,F   Continuous Infusions: . meropenem (MERREM) IV 1 g (04/02/18 0600)   PRN Meds: acetaminophen, camphor-menthol, hydrocortisone cream, nitroGLYCERIN, ondansetron (ZOFRAN) IV   Vital Signs    Vitals:   04/01/18 2100 04/02/18 0011 04/02/18 0320 04/02/18 0750  BP: (!) 160/68 (!) 135/45 (!) 134/56 (!) 143/62  Pulse: 75 (!) 58 (!) 56   Resp: 18 18 18    Temp: 98.2 F (36.8 C) 97.9 F (36.6 C) 97.8 F (36.6 C) 98 F (36.7 C)  TempSrc: Oral Oral Oral Oral  SpO2: 100% 98% 99% 100%  Weight:   112 kg     Intake/Output Summary (Last 24 hours) at 04/02/2018 1154 Last data filed at 04/02/2018 0751 Gross per 24 hour  Intake 348.48 ml  Output 850 ml  Net -501.52 ml   Filed Weights   03/31/18 0414 04/01/18 0344 04/02/18 0320  Weight: 114.7 kg 111.9 kg 112 kg    Telemetry    NSR - Personally Reviewed  ECG    NA - Personally Reviewed  Physical Exam   GEN: No acute distress.   Neck: No  JVD Cardiac: RRR, no murmurs, rubs, or gallops.  Respiratory: Clear  to auscultation bilaterally. GI: Soft, nontender, non-distended  MS:    Severe  edema; No deformity. Neuro:  Nonfocal  Psych: Normal affect   Labs      Chemistry Recent Labs  Lab 03/30/18 0841 03/31/18 0342 04/01/18 0250  NA 138 139 140  K 3.4* 3.9 3.5  CL 94* 98 99  CO2 34* 31 33*  GLUCOSE 121* 107* 89  BUN 63* 59* 52*  CREATININE 1.62* 1.62* 1.66*  CALCIUM 9.1 9.2 9.2  ALBUMIN 3.1* 3.1* 2.9*  GFRNONAA 39* 39* 38*  GFRAA 45* 45* 44*  ANIONGAP 10 10 8      Hematology Recent Labs  Lab 04/01/18 0250 04/02/18 0636  WBC 7.9 6.6  RBC 3.46* 3.43*  HGB 9.5* 9.2*  HCT 30.8* 31.0*  MCV 89.0 90.4  MCH 27.5 26.8  MCHC 30.8 29.7*  RDW 17.5* 17.6*  PLT 253 255    Cardiac EnzymesNo results for input(s): TROPONINI in the last 168 hours. No results for input(s): TROPIPOC in the last 168 hours.   BNPNo results for input(s): BNP, PROBNP in the last 168 hours.   DDimer No results for input(s): DDIMER in the last 168 hours.   Radiology    No results found.  Cardiac Studies     Echo:  Study Conclusions  - Left ventricle: The cavity size  was mildly to moderately dilated.   Wall thickness was normal. Systolic function was severely   reduced. The estimated ejection fraction was in the range of 25%   to 30%. Diffuse hypokinesis. Indeterminant diastolic function. - Aortic valve: Trileaflet; moderately calcified leaflets.   Sclerosis without stenosis. - Mitral valve: Mildly calcified annulus. There was mild   regurgitation. - Left atrium: The atrium was moderately dilated. - Right ventricle: The cavity size was mildly dilated. Systolic   function was moderately reduced. - Right atrium: The atrium was moderately dilated. - Tricuspid valve: Peak RV-RA gradient (S): 39 mm Hg. - Systemic veins: IVC was not visualized.  Patient Profile     77 y.o. male with a history of chronic combined systolic/diastolic heart failure, PAF, GI bleed, PAD, memory impairment, CAD, CABG, hypterlipidemia, CKD, DMII.   Admitted with increased fatigue and dyspnea.    Assessment & Plan    ACUTE ON CHRONIC SYSTOLIC HF:   Treated yesterday with IV  Lasix 120 mg and metolazone.  Hydralazine and Imdur added.  Not good UO recorded.    I will reorder the Lasix IV today.  Needs to keep feet up.  I don't think the I/Os are complete.  I am waiting for the creat drawn this morning.    AKI:    Renal panel not resulted yet.    ATRIAL FIB:  Amiodarone decreased to 200 mg daily.  Continue Eliquis.    For questions or updates, please contact [place] Please consult [email or web address] for contact info under Cardiology/STEMI.   Signed, Minus Breeding, MD  04/02/2018, 11:54 AM

## 2018-04-02 NOTE — Progress Notes (Signed)
TEAM 1 - Stepdown/ICU TEAM  [name]  OHY:[id number] DOB: Jan 28, 1941 DOA: 03/18/2018 PCP: Lavone Orn, MD    Brief Narrative:  77 y.o. male with a hx of systolic heart failure, atrial flutter, recent GI bleed, CAD s/p CABG, hypothyroidism, DM, GERD, HTN, HLD, stage III/stage IV CKD, and PAD who was recently discharged from the hospital after a GI bleed who presented w/ generalized weakness, and inability to ambulate, and shortness of breath.    On presentation to the hospital, patient was noted to be in atrial fibrillation with rapid ventricular response, acute kidney injury on chronic kidney disease stage III/IV, acute on chronic combined systolic and diastolic congestive heart failure with recent echocardiogram revealing EF of 30 to 94%, grade 2 diastolic dysfunction and decreased right ventricular systolic function.  UA is suggestive of likely UTI and blood culture is growing Klebsiella pneumonia, suspect likely secondary to UTI/sepsis.  Worsening mental status is noted.  Sodium is 126.  Patient remains volume overloaded, despite being on high-dose Lasix.  04/02/2018: Patient seen.  Also discussed with patient's wife.  No new changes.  His serum creatinine is 1.75 today.  Cardiology is managing patient's diuretics.  The plan is for patient to be discharged back home with hospice care.  Subjective: No fever chills. No chest pain. No worsening of shortness of breath.  Assessment & Plan: Acute exacerbation of chronic combined systolic and diastolic CHF: -Consult heart failure team. -Query diuresis resistant. -BUN of 120, serum creatinine of 3.3, and with likely uremia - query role of hemodialysis, but patient may not be a good candidate due to multiple cold morbidities. -Latest echo revealed EF of 30 to 35% (kindly see above). - Await further recommendation from the heart failure team. - Nephrology team also consulted.  -03/31/18: Continue IV diuretics for now.  Continue  to monitor renal function and electrolytes.  Case management/social work input appreciated regarding disposition.  We will continue to discuss disposition with patient and patient's family. 04/01/2018: Heart failure team is managing diuretics today.   Atrial flutter/Atrial Fib with rapid ventricular response: Cardiology is directing care  Eliquis was held during recent admit for GIB, but resumed at time of d/c  S/P TEE DCCV  UTI/bacteremia/sepsis, likely secondary to Klebsiella: 03/24/2018: Blood culture final result revealed Klebsiella pneumonia, ESBL. Discontinued IV Rocephin. Continue IV meropenem. Complete course of IV meropenem (2 weeks course).  Hypoalbuminemia:  May be indicative of poor prognosis.    Acute kidney injury on chronic kidney disease stage III/IV:  Nephrology input is appreciated.  As per nephrology team, not ideal candidate for dialysis. We will continue to assess. 03/25/2018: Serum creatinine is trending downwards. 03/26/2018: Renal function is improving.  The BUN is down to 107 and serum creatinine is down to 2.47.  Continue current management. 03/27/2018: AKI continues to improve.  Monitor closely. 03/28/2018: BUN is 89 today and creatinine is 2.1.  Overall, long-term prognosis remains guarded.  Recent Labs  Lab 03/29/18 1019 03/30/18 0841 03/31/18 0342 04/01/18 0250 04/02/18 0636  CREATININE 1.87* 1.62* 1.62* 1.66* 1.75*     Hypokalemia: Potassium is 3.5 today Cautious replacement considering impaired renal function.  Hyponatremia: Resolved.  DM2: Continue to optimize.  HLD  Anemia of chronic kidney disease and recent GIB Follow trend - Hgb stable    GIB Oct 2019 EGD 03/10/18 w/o clear source of bleeding   CAD s/p CABG Has suspected graft failure - not a candidate for cardiac cath given kidney disease   Cystic  lesion of pancreas Noted on MRI abdom earlier this month - mildly elevated CA 19-9 not felt to be worrisome per GI   Consulted  palliative team. Patient and family still want patient to be actively treated.   DVT prophylaxis: eliquis  Code Status: FULL CODE Family Communication: spoke wife.    Disposition Plan: Will depend on hospital course.    Consultants:  [facility] Cardiology Heart failure team. Nephrology.  Antimicrobials:  IV Rocephin discontinued on 03/24/2018. IV meropenem  Objective: Blood pressure 131/60, pulse (!) 56, temperature 98 F (36.7 C), temperature source Oral, resp. rate 18, weight 112 kg, SpO2 99 %.  Intake/Output Summary (Last 24 hours) at 04/02/2018 1805 Last data filed at 04/02/2018 1700 Gross per 24 hour  Intake 706.15 ml  Output 1200 ml  Net -493.85 ml   Filed Weights   03/31/18 0414 04/01/18 0344 04/02/18 0320  Weight: 114.7 kg 111.9 kg 112 kg    Examination: General: Awake and communicative.    Lungs: Decreased air entry. Cardiovascular: S1-S2. Abdomen: Nontender, obese, soft, BS+, no rebound Extremities: ++ BLE edema   CBC: Recent Labs  Lab 04/01/18 0250 04/02/18 0636  WBC 7.9 6.6  NEUTROABS 6.2  --   HGB 9.5* 9.2*  HCT 30.8* 31.0*  MCV 89.0 90.4  PLT 253 564   Basic Metabolic Panel: Recent Labs  Lab 03/27/18 0341  03/31/18 0342 04/01/18 0250 04/02/18 0636  NA 134*   < > 139 140 138  K 3.0*   < > 3.9 3.5 4.5  CL 93*   < > 98 99 98  CO2 30   < > 31 33* 35*  GLUCOSE 144*   < > 107* 89 106*  BUN 100*   < > 59* 52* 47*  CREATININE 2.49*   < > 1.62* 1.66* 1.75*  CALCIUM 8.4*   < > 9.2 9.2 9.1  MG 2.2  --   --  2.1  --   PHOS 3.4   < > 2.3* 2.3* 2.5   < > = values in this interval not displayed.   GFR: Estimated Creatinine Clearance: 44.3 mL/min (A) (by C-G formula based on SCr of 1.75 mg/dL (H)).  Liver Function Tests: Recent Labs  Lab 03/30/18 0841 03/31/18 0342 04/01/18 0250 04/02/18 0636  ALBUMIN 3.1* 3.1* 2.9* 2.9*    Cardiac Enzymes: No results for input(s): CKTOTAL, CKMB, CKMBINDEX, TROPONINI in the last 168 hours.  HbA1C: Hgb A1c  MFr Bld  Date/Time Value Ref Range Status  10/21/2017 12:33 PM 6.2 (H) 4.8 - 5.6 % Final    Comment:    (NOTE) Pre diabetes:          5.7%-6.4% Diabetes:              >6.4% Glycemic control for   <7.0% adults with diabetes   09/24/2011 05:50 AM 6.4 (H) <5.7 % Final    Comment:    (NOTE)                                                                       According to the ADA Clinical Practice Recommendations for 2011, when HbA1c is used as a screening test:  >=6.5%   Diagnostic of Diabetes Mellitus           (  if abnormal result is confirmed) 5.7-6.4%   Increased risk of developing Diabetes Mellitus References:Diagnosis and Classification of Diabetes Mellitus,Diabetes EEFE,0712,19(XJOIT 1):S62-S69 and Standards of Medical Care in         Diabetes - 2011,Diabetes GPQD,8264,15 (Suppl 1):S11-S61.    CBG: Recent Labs  Lab 04/01/18 1608 04/01/18 2136 04/02/18 0747 04/02/18 1145 04/02/18 1634  GLUCAP 162* 119* 96 140* 139*    Scheduled Meds: . amiodarone  200 mg Oral Daily  . apixaban  5 mg Oral BID  . atorvastatin  20 mg Oral QPC supper  . carvedilol  3.125 mg Oral BID  . fenofibrate  54 mg Oral Daily  . gabapentin  100 mg Oral BID PC  . hydrALAZINE  12.5 mg Oral Q8H  . insulin aspart  0-20 Units Subcutaneous TID WC  . insulin aspart  0-5 Units Subcutaneous QHS  . insulin glargine  8 Units Subcutaneous QHS  . isosorbide mononitrate  30 mg Oral Daily  . levothyroxine  100 mcg Oral QAC breakfast  . pantoprazole  40 mg Oral Daily  . tamsulosin  0.4 mg Oral Q M,W,F   Continuous Infusions: . furosemide    . meropenem (MERREM) IV 1 g (04/02/18 0600)     LOS: 15 days   Bonnell Public, MD [facility] Office  [phone] Pager - Text Page per Amion  If 7PM-7AM, please contact night-coverage per Amion 04/02/2018, 6:05 PM

## 2018-04-02 NOTE — Progress Notes (Signed)
[id number]) RN Note  Visited with patient and wife in room. Patient sitting in chair eating lunch. No distress noted. Wife reports he may be having a procedure Monday, so no plan for discharge this weekend. Reviewed DME requests with wife who requests hospital bed, OBT and oxygen. DME will not be ordered until final discharge plans solidified.  Please call with any hospice related questions or concerns.  Thank you, Margaretmary Eddy, RN, [facility] Liaison [phone]  [place] are on AMION.

## 2018-04-03 LAB — CBC
HCT: 28.4 % — ABNORMAL LOW (ref 39.0–52.0)
Hemoglobin: 8.9 g/dL — ABNORMAL LOW (ref 13.0–17.0)
MCH: 27.6 pg (ref 26.0–34.0)
MCHC: 31.3 g/dL (ref 30.0–36.0)
MCV: 87.9 fL (ref 80.0–100.0)
Platelets: 222 10*3/uL (ref 150–400)
RBC: 3.23 MIL/uL — ABNORMAL LOW (ref 4.22–5.81)
RDW: 17.4 % — ABNORMAL HIGH (ref 11.5–15.5)
WBC: 6.6 10*3/uL (ref 4.0–10.5)
nRBC: 0 % (ref 0.0–0.2)

## 2018-04-03 LAB — RENAL FUNCTION PANEL
ANION GAP: 8 (ref 5–15)
Albumin: 2.7 g/dL — ABNORMAL LOW (ref 3.5–5.0)
BUN: 44 mg/dL — ABNORMAL HIGH (ref 8–23)
CALCIUM: 9 mg/dL (ref 8.9–10.3)
CHLORIDE: 96 mmol/L — AB (ref 98–111)
CO2: 33 mmol/L — AB (ref 22–32)
Creatinine, Ser: 1.96 mg/dL — ABNORMAL HIGH (ref 0.61–1.24)
GFR calc non Af Amer: 31 mL/min — ABNORMAL LOW (ref >60)
GFR, EST AFRICAN AMERICAN: 36 mL/min — AB (ref >60)
GLUCOSE: 114 mg/dL — AB (ref 70–99)
Phosphorus: 2.5 mg/dL (ref 2.5–4.6)
Potassium: 3.7 mmol/L (ref 3.5–5.1)
SODIUM: 137 mmol/L (ref 135–145)

## 2018-04-03 LAB — GLUCOSE, CAPILLARY
GLUCOSE-CAPILLARY: 179 mg/dL — AB (ref 70–99)
GLUCOSE-CAPILLARY: 131 mg/dL — AB (ref 70–99)
Glucose-Capillary: 169 mg/dL — ABNORMAL HIGH (ref 70–99)

## 2018-04-03 MED ORDER — SODIUM CHLORIDE 0.9 % IV SOLN: 250.0000 mL | INTRAVENOUS | Status: DC | PRN

## 2018-04-03 MED ORDER — SODIUM CHLORIDE 0.9% FLUSH
3.0000 mL | Freq: Two times a day (BID) | INTRAVENOUS | Status: DC
  Administered 2018-04-03: 3 mL via INTRAVENOUS

## 2018-04-03 MED ORDER — SODIUM CHLORIDE 0.9% FLUSH: 3.0000 mL | INTRAVENOUS | Status: DC | PRN

## 2018-04-03 MED ORDER — SODIUM CHLORIDE 0.9 % IV SOLN
INTRAVENOUS | Status: DC
  Administered 2018-04-03: 23:00:00 via INTRAVENOUS

## 2018-04-03 MED ORDER — ASPIRIN 81 MG PO CHEW
81.0000 mg | CHEWABLE_TABLET | ORAL | Status: AC
  Administered 2018-04-04: 81 mg via ORAL
  Filled 2018-04-03: qty 1

## 2018-04-03 MED ORDER — TORSEMIDE 20 MG PO TABS
40.0000 mg | ORAL_TABLET | Freq: Two times a day (BID) | ORAL | Status: DC
  Administered 2018-04-03: 40 mg via ORAL
  Filled 2018-04-03: qty 2

## 2018-04-03 NOTE — Progress Notes (Signed)
[place] TEAM 1 - Stepdown/ICU TEAM  [name]  UXL:[id number] DOB: 09/17/1940 DOA: 03/18/2018 PCP: Lavone Orn, MD    Brief Narrative:  77 y.o. male with a hx of systolic heart failure, atrial flutter, recent GI bleed, CAD s/p CABG, hypothyroidism, DM, GERD, HTN, HLD, stage III/stage IV CKD, and PAD who was recently discharged from the hospital after a GI bleed who presented w/ generalized weakness, and inability to ambulate, and shortness of breath.    On presentation to the hospital, patient was noted to be in atrial fibrillation with rapid ventricular response, acute kidney injury on chronic kidney disease stage III/IV, acute on chronic combined systolic and diastolic congestive heart failure with recent echocardiogram revealing EF of 30 to 53%, grade 2 diastolic dysfunction and decreased right ventricular systolic function.  UA is suggestive of likely UTI and blood culture is growing Klebsiella pneumonia, suspect likely secondary to UTI/sepsis.  Worsening mental status is noted.  Sodium is 126.  Patient remains volume overloaded, despite being on high-dose Lasix.  04/03/2018: Patient seen.  Also discussed with patient's wife.  No new changes.  His serum creatinine is 1.96 today.  Cardiology is managing patient's diuretics.  The plan is for patient to be discharged back home with hospice care.  Subjective: No fever chills. No chest pain. No worsening of shortness of breath.  Assessment & Plan: Acute exacerbation of chronic combined systolic and diastolic CHF: -Consult heart failure team. -Query diuresis resistant. -BUN of 120, serum creatinine of 3.3, and with likely uremia - query role of hemodialysis, but patient may not be a good candidate due to multiple cold morbidities. -Latest echo revealed EF of 30 to 35% (kindly see above). - Await further recommendation from the heart failure team. - Nephrology team also consulted.  -03/31/18: Continue IV diuretics for now.  Continue  to monitor renal function and electrolytes.  Case management/social work input appreciated regarding disposition.  We will continue to discuss disposition with patient and patient's family. 04/01/2018: Heart failure team is managing diuretics today.   Atrial flutter/Atrial Fib with rapid ventricular response: Cardiology is directing care  Eliquis was held during recent admit for GIB, but resumed at time of d/c  S/P TEE DCCV  UTI/bacteremia/sepsis, likely secondary to Klebsiella: 03/24/2018: Blood culture final result revealed Klebsiella pneumonia, ESBL. Discontinued IV Rocephin. Continue IV meropenem. Complete course of IV meropenem (2 weeks course).  Hypoalbuminemia:  May be indicative of poor prognosis.    Acute kidney injury on chronic kidney disease stage III/IV:  Nephrology input is appreciated.  As per nephrology team, not ideal candidate for dialysis. We will continue to assess. 03/25/2018: Serum creatinine is trending downwards. 03/26/2018: Renal function is improving.  The BUN is down to 107 and serum creatinine is down to 2.47.  Continue current management. 03/27/2018: AKI continues to improve.  Monitor closely. 03/28/2018: BUN is 89 today and creatinine is 2.1.  Overall, long-term prognosis remains guarded.  Recent Labs  Lab 03/30/18 0841 03/31/18 0342 04/01/18 0250 04/02/18 0636 04/03/18 0427  CREATININE 1.62* 1.62* 1.66* 1.75* 1.96*     Hypokalemia: Potassium is 3.5 today Cautious replacement considering impaired renal function.  Hyponatremia: Resolved.  DM2: Continue to optimize.  HLD  Anemia of chronic kidney disease and recent GIB Follow trend - Hgb stable    GIB Oct 2019 EGD 03/10/18 w/o clear source of bleeding   CAD s/p CABG Has suspected graft failure - not a candidate for cardiac cath given kidney disease   Cystic  lesion of pancreas Noted on MRI abdom earlier this month - mildly elevated CA 19-9 not felt to be worrisome per GI   Consulted  palliative team. Patient and family still want patient to be actively treated.   DVT prophylaxis: eliquis  Code Status: FULL CODE Family Communication: spoke wife.    Disposition Plan: Will depend on hospital course.    Consultants:  [facility] Cardiology Heart failure team. Nephrology.  Antimicrobials:  IV Rocephin discontinued on 03/24/2018. IV meropenem  Objective: Blood pressure (!) 133/53, pulse 72, temperature 98.7 F (37.1 C), temperature source Oral, resp. rate 19, weight 111.9 kg, SpO2 97 %.  Intake/Output Summary (Last 24 hours) at 04/03/2018 1027 Last data filed at 04/03/2018 0733 Gross per 24 hour  Intake 600 ml  Output 1870 ml  Net -1270 ml   Filed Weights   04/01/18 0344 04/02/18 0320 04/03/18 0457  Weight: 111.9 kg 112 kg 111.9 kg    Examination: General: Awake and communicative.    Lungs: Decreased air entry. Cardiovascular: S1-S2. Abdomen: Nontender, obese, soft, BS+, no rebound Extremities: ++ BLE edema   CBC: Recent Labs  Lab 04/01/18 0250 04/02/18 0636 04/03/18 0427  WBC 7.9 6.6 6.6  NEUTROABS 6.2  --   --   HGB 9.5* 9.2* 8.9*  HCT 30.8* 31.0* 28.4*  MCV 89.0 90.4 87.9  PLT 253 255 263   Basic Metabolic Panel: Recent Labs  Lab 04/01/18 0250 04/02/18 0636 04/03/18 0427  NA 140 138 137  K 3.5 4.5 3.7  CL 99 98 96*  CO2 33* 35* 33*  GLUCOSE 89 106* 114*  BUN 52* 47* 44*  CREATININE 1.66* 1.75* 1.96*  CALCIUM 9.2 9.1 9.0  MG 2.1  --   --   PHOS 2.3* 2.5 2.5   GFR: Estimated Creatinine Clearance: 39.5 mL/min (A) (by C-G formula based on SCr of 1.96 mg/dL (H)).  Liver Function Tests: Recent Labs  Lab 03/31/18 0342 04/01/18 0250 04/02/18 0636 04/03/18 0427  ALBUMIN 3.1* 2.9* 2.9* 2.7*    Cardiac Enzymes: No results for input(s): CKTOTAL, CKMB, CKMBINDEX, TROPONINI in the last 168 hours.  HbA1C: Hgb A1c MFr Bld  Date/Time Value Ref Range Status  10/21/2017 12:33 PM 6.2 (H) 4.8 - 5.6 % Final    Comment:    (NOTE) Pre  diabetes:          5.7%-6.4% Diabetes:              >6.4% Glycemic control for   <7.0% adults with diabetes   09/24/2011 05:50 AM 6.4 (H) <5.7 % Final    Comment:    (NOTE)                                                                       According to the ADA Clinical Practice Recommendations for 2011, when HbA1c is used as a screening test:  >=6.5%   Diagnostic of Diabetes Mellitus           (if abnormal result is confirmed) 5.7-6.4%   Increased risk of developing Diabetes Mellitus References:Diagnosis and Classification of Diabetes Mellitus,Diabetes FHLK,5625,63(SLHTD 1):S62-S69 and Standards of Medical Care in         Diabetes - 2011,Diabetes SKAJ,6811,57 (Suppl 1):S11-S61.    CBG: Recent  Labs  Lab 04/01/18 2136 04/02/18 0747 04/02/18 1145 04/02/18 1634 04/02/18 2110  GLUCAP 119* 96 140* 139* 122*    Scheduled Meds: . amiodarone  200 mg Oral Daily  . apixaban  5 mg Oral BID  . atorvastatin  20 mg Oral QPC supper  . carvedilol  3.125 mg Oral BID  . fenofibrate  54 mg Oral Daily  . gabapentin  100 mg Oral BID PC  . hydrALAZINE  12.5 mg Oral Q8H  . insulin aspart  0-20 Units Subcutaneous TID WC  . insulin aspart  0-5 Units Subcutaneous QHS  . insulin glargine  8 Units Subcutaneous QHS  . isosorbide mononitrate  30 mg Oral Daily  . levothyroxine  100 mcg Oral QAC breakfast  . pantoprazole  40 mg Oral Daily  . tamsulosin  0.4 mg Oral Q M,W,F  . torsemide  40 mg Oral BID   Continuous Infusions: . meropenem (MERREM) IV 1 g (04/03/18 0512)     LOS: 16 days   Bonnell Public, MD [facility] Office  [phone] Pager - Text Page per Shea Evans  If 7PM-7AM, please contact night-coverage per Amion 04/03/2018, 10:27 AM

## 2018-04-03 NOTE — Plan of Care (Signed)
Plan Rt Heart Cath.  Tolerates transfer to chair with walker and assist 1 - 2. Palliative Consult.

## 2018-04-03 NOTE — Progress Notes (Addendum)
Progress Note  Patient Name: [name] Date of Encounter: 04/03/2018  Primary Cardiologist:   Sinclair Grooms, MD   Subjective   He hopes to go home Tuesday.  He has had no new SOB.  No chest pain.    Inpatient Medications    Scheduled Meds: . amiodarone  200 mg Oral Daily  . apixaban  5 mg Oral BID  . atorvastatin  20 mg Oral QPC supper  . carvedilol  3.125 mg Oral BID  . fenofibrate  54 mg Oral Daily  . gabapentin  100 mg Oral BID PC  . hydrALAZINE  12.5 mg Oral Q8H  . insulin aspart  0-20 Units Subcutaneous TID WC  . insulin aspart  0-5 Units Subcutaneous QHS  . insulin glargine  8 Units Subcutaneous QHS  . isosorbide mononitrate  30 mg Oral Daily  . levothyroxine  100 mcg Oral QAC breakfast  . pantoprazole  40 mg Oral Daily  . tamsulosin  0.4 mg Oral Q M,W,F   Continuous Infusions: . meropenem (MERREM) IV 1 g (04/03/18 0512)   PRN Meds: acetaminophen, camphor-menthol, hydrocortisone cream, nitroGLYCERIN, ondansetron (ZOFRAN) IV   Vital Signs    Vitals:   04/02/18 1637 04/02/18 1948 04/03/18 0052 04/03/18 0457  BP: 131/60 134/69 (!) 152/61 (!) 133/53  Pulse:  (!) 59 72   Resp:  18 (!) 22 19  Temp:  98.1 F (36.7 C) 98.7 F (37.1 C) 98.7 F (37.1 C)  TempSrc:  Oral Oral Oral  SpO2: 99% 97% 97%   Weight:    111.9 kg    Intake/Output Summary (Last 24 hours) at 04/03/2018 0843 Last data filed at 04/03/2018 0733 Gross per 24 hour  Intake 840 ml  Output 2170 ml  Net -1330 ml   Filed Weights   04/01/18 0344 04/02/18 0320 04/03/18 0457  Weight: 111.9 kg 112 kg 111.9 kg    Telemetry    NSR, artifact - Personally Reviewed  ECG    NA - Personally Reviewed  Physical Exam   GEN: No  acute distress.   Neck: No  JVD Cardiac: RRR, no murmurs, rubs, or gallops.  Respiratory:   Decreased breath sounds with fine crackles at the bases.  GI: Soft, nontender, non-distended, normal bowel sounds  MS:  Moderate  edema; No deformity. Neuro:    Nonfocal  Psych: Oriented and appropriate    Labs    Chemistry Recent Labs  Lab 04/01/18 0250 04/02/18 0636 04/03/18 0427  NA 140 138 137  K 3.5 4.5 3.7  CL 99 98 96*  CO2 33* 35* 33*  GLUCOSE 89 106* 114*  BUN 52* 47* 44*  CREATININE 1.66* 1.75* 1.96*  CALCIUM 9.2 9.1 9.0  ALBUMIN 2.9* 2.9* 2.7*  GFRNONAA 38* 36* 31*  GFRAA 44* 41* 36*  ANIONGAP 8 5 8      Hematology Recent Labs  Lab 04/01/18 0250 04/02/18 0636 04/03/18 0427  WBC 7.9 6.6 6.6  RBC 3.46* 3.43* 3.23*  HGB 9.5* 9.2* 8.9*  HCT 30.8* 31.0* 28.4*  MCV 89.0 90.4 87.9  MCH 27.5 26.8 27.6  MCHC 30.8 29.7* 31.3  RDW 17.5* 17.6* 17.4*  PLT 253 255 222    Cardiac EnzymesNo results for input(s): TROPONINI in the last 168 hours. No results for input(s): TROPIPOC in the last 168 hours.   BNPNo results for input(s): BNP, PROBNP in the last 168 hours.   DDimer No results for input(s): DDIMER in the last 168 hours.   Radiology  No results found.  Cardiac Studies     Echo:  Study Conclusions  - Left ventricle: The cavity size was mildly to moderately dilated.   Wall thickness was normal. Systolic function was severely   reduced. The estimated ejection fraction was in the range of 25%   to 30%. Diffuse hypokinesis. Indeterminant diastolic function. - Aortic valve: Trileaflet; moderately calcified leaflets.   Sclerosis without stenosis. - Mitral valve: Mildly calcified annulus. There was mild   regurgitation. - Left atrium: The atrium was moderately dilated. - Right ventricle: The cavity size was mildly dilated. Systolic   function was moderately reduced. - Right atrium: The atrium was moderately dilated. - Tricuspid valve: Peak RV-RA gradient (S): 39 mm Hg. - Systemic veins: IVC was not visualized.  Patient Profile     77 y.o. male with a history of chronic combined systolic/diastolic heart failure, PAF, GI bleed, PAD, memory impairment, CAD, CABG, hypterlipidemia, CKD, DMII.   Admitted with  increased fatigue and dyspnea.    Assessment & Plan    ACUTE ON CHRONIC SYSTOLIC HF:   Treated yesterday with IV Lasix 120 mg.  Creat is up.  I will change to PO.  No JVD but patient is seated at 90 degrees.  I will discuss with Dr. Aundra Dubin whether or not he will have right heart cath in the AM.  Good UO yesterday.    (Discussed with Dr. Aundra Dubin and the patient will get a right heart cath tomorrow.)    AKI:    Creat up as above.    ATRIAL FIB:   Continue Eliquis.    ANEMIA:  Hgb is drifting down.    For questions or updates, please contact [place] Please consult [email or web address] for contact info under Cardiology/STEMI.   Signed, Minus Breeding, MD  04/03/2018, 8:43 AM

## 2018-04-04 ENCOUNTER — Telehealth: Payer: Self-pay | Admitting: Cardiology

## 2018-04-04 ENCOUNTER — Telehealth: Payer: Self-pay | Admitting: Interventional Cardiology

## 2018-04-04 ENCOUNTER — Encounter (HOSPITAL_COMMUNITY)
Admission: EM | Disposition: A | Discharge status: Home Health | Payer: Self-pay | Source: Home / Self Care | Attending: Internal Medicine

## 2018-04-04 DIAGNOSIS — I509 Heart failure, unspecified: Secondary | ICD-10-CM

## 2018-04-04 HISTORY — PX: RIGHT HEART CATH: CATH118263

## 2018-04-04 LAB — RENAL FUNCTION PANEL
Albumin: 2.9 g/dL — ABNORMAL LOW (ref 3.5–5.0)
Anion gap: 10 (ref 5–15)
BUN: 44 mg/dL — ABNORMAL HIGH (ref 8–23)
CHLORIDE: 94 mmol/L — AB (ref 98–111)
CO2: 32 mmol/L (ref 22–32)
CREATININE: 1.77 mg/dL — AB (ref 0.61–1.24)
Calcium: 9.2 mg/dL (ref 8.9–10.3)
GFR calc non Af Amer: 35 mL/min — ABNORMAL LOW (ref >60)
GFR, EST AFRICAN AMERICAN: 41 mL/min — AB (ref >60)
Glucose, Bld: 119 mg/dL — ABNORMAL HIGH (ref 70–99)
POTASSIUM: 3.4 mmol/L — AB (ref 3.5–5.1)
Phosphorus: 2.5 mg/dL (ref 2.5–4.6)
Sodium: 136 mmol/L (ref 135–145)

## 2018-04-04 LAB — CBC
HCT: 29.7 % — ABNORMAL LOW (ref 39.0–52.0)
Hemoglobin: 9.2 g/dL — ABNORMAL LOW (ref 13.0–17.0)
MCH: 27.2 pg (ref 26.0–34.0)
MCHC: 31 g/dL (ref 30.0–36.0)
MCV: 87.9 fL (ref 80.0–100.0)
Platelets: 224 10*3/uL (ref 150–400)
RBC: 3.38 MIL/uL — ABNORMAL LOW (ref 4.22–5.81)
RDW: 17.5 % — ABNORMAL HIGH (ref 11.5–15.5)
WBC: 6.5 10*3/uL (ref 4.0–10.5)
nRBC: 0 % (ref 0.0–0.2)

## 2018-04-04 LAB — POCT I-STAT 3, VENOUS BLOOD GAS (G3P V)
ACID-BASE EXCESS: 10 mmol/L — AB (ref 0.0–2.0)
Acid-Base Excess: 10 mmol/L — ABNORMAL HIGH (ref 0.0–2.0)
BICARBONATE: 34.6 mmol/L — AB (ref 20.0–28.0)
BICARBONATE: 34.8 mmol/L — AB (ref 20.0–28.0)
O2 SAT: 66 %
O2 Saturation: 67 %
PCO2 VEN: 48.4 mmHg (ref 44.0–60.0)
PH VEN: 7.465 — AB (ref 7.250–7.430)
PO2 VEN: 34 mmHg (ref 32.0–45.0)
TCO2: 36 mmol/L — AB (ref 22–32)
TCO2: 36 mmol/L — AB (ref 22–32)
pCO2, Ven: 48.4 mmHg (ref 44.0–60.0)
pH, Ven: 7.463 — ABNORMAL HIGH (ref 7.250–7.430)
pO2, Ven: 33 mmHg (ref 32.0–45.0)

## 2018-04-04 LAB — GLUCOSE, CAPILLARY
GLUCOSE-CAPILLARY: 147 mg/dL — AB (ref 70–99)
Glucose-Capillary: 116 mg/dL — ABNORMAL HIGH (ref 70–99)
Glucose-Capillary: 116 mg/dL — ABNORMAL HIGH (ref 70–99)
Glucose-Capillary: 105 mg/dL — ABNORMAL HIGH (ref 70–99)

## 2018-04-04 SURGERY — RIGHT HEART CATH

## 2018-04-04 MED ORDER — HYDRALAZINE HCL 25 MG PO TABS
25.0000 mg | ORAL_TABLET | Freq: Three times a day (TID) | ORAL | Status: DC
  Administered 2018-04-04 – 2018-04-06 (×7): 25 mg via ORAL
  Filled 2018-04-04 (×7): qty 1

## 2018-04-04 MED ORDER — TORSEMIDE 20 MG PO TABS
60.0000 mg | ORAL_TABLET | Freq: Two times a day (BID) | ORAL | Status: DC
  Filled 2018-04-04: qty 3

## 2018-04-04 MED ORDER — ONDANSETRON HCL 4 MG/2ML IJ SOLN: 4.0000 mg | Freq: Four times a day (QID) | INTRAMUSCULAR | Status: DC | PRN

## 2018-04-04 MED ORDER — ACETAMINOPHEN 325 MG PO TABS: 650.0000 mg | ORAL_TABLET | ORAL | Status: DC | PRN

## 2018-04-04 MED ORDER — APIXABAN 5 MG PO TABS
5.0000 mg | ORAL_TABLET | Freq: Two times a day (BID) | ORAL | Status: DC
  Administered 2018-04-05 – 2018-04-06 (×3): 5 mg via ORAL
  Filled 2018-04-04 (×4): qty 1

## 2018-04-04 MED ORDER — LIDOCAINE HCL (PF) 1 % IJ SOLN
INTRAMUSCULAR | Status: AC
  Filled 2018-04-04: qty 30

## 2018-04-04 MED ORDER — HEPARIN (PORCINE) IN NACL 1000-0.9 UT/500ML-% IV SOLN
INTRAVENOUS | Status: DC | PRN
  Administered 2018-04-04: 500 mL

## 2018-04-04 MED ORDER — SODIUM CHLORIDE 0.9 % IV SOLN: 250.0000 mL | INTRAVENOUS | Status: DC | PRN

## 2018-04-04 MED ORDER — CALCIUM CARBONATE ANTACID 500 MG PO CHEW
1.0000 | CHEWABLE_TABLET | Freq: Two times a day (BID) | ORAL | Status: DC | PRN
  Administered 2018-04-04: 200 mg via ORAL
  Filled 2018-04-04: qty 1

## 2018-04-04 MED ORDER — SODIUM CHLORIDE 0.9% FLUSH: 3.0000 mL | INTRAVENOUS | Status: DC | PRN

## 2018-04-04 MED ORDER — LIDOCAINE HCL (PF) 1 % IJ SOLN
INTRAMUSCULAR | Status: DC | PRN
  Administered 2018-04-04: 2 mL via INTRADERMAL

## 2018-04-04 MED ORDER — FUROSEMIDE 10 MG/ML IJ SOLN
80.0000 mg | Freq: Two times a day (BID) | INTRAMUSCULAR | Status: DC
  Administered 2018-04-05 – 2018-04-06 (×3): 80 mg via INTRAVENOUS
  Filled 2018-04-04 (×4): qty 8

## 2018-04-04 MED ORDER — SODIUM CHLORIDE 0.9% FLUSH
3.0000 mL | Freq: Two times a day (BID) | INTRAVENOUS | Status: DC
  Administered 2018-04-04 – 2018-04-06 (×4): 3 mL via INTRAVENOUS

## 2018-04-04 MED ORDER — HEPARIN (PORCINE) IN NACL 1000-0.9 UT/500ML-% IV SOLN
INTRAVENOUS | Status: AC
  Filled 2018-04-04: qty 500

## 2018-04-04 MED ORDER — POTASSIUM CHLORIDE CRYS ER 20 MEQ PO TBCR
40.0000 meq | EXTENDED_RELEASE_TABLET | Freq: Once | ORAL | Status: AC
  Administered 2018-04-04: 40 meq via ORAL
  Filled 2018-04-04: qty 2

## 2018-04-04 SURGICAL SUPPLY — 8 items
CATH BALLN WEDGE 5F 110CM (CATHETERS) ×2 IMPLANT
GUIDEWIRE .025 260CM (WIRE) ×2 IMPLANT
KIT HEART LEFT (KITS) ×3 IMPLANT
PACK CARDIAC CATHETERIZATION (CUSTOM PROCEDURE TRAY) ×3 IMPLANT
SHEATH GLIDE SLENDER 4/5FR (SHEATH) ×2 IMPLANT
TRANSDUCER W/STOPCOCK (MISCELLANEOUS) ×3 IMPLANT
TUBING ART PRESS 72  MALE/FEM (TUBING) ×2
TUBING ART PRESS 72 MALE/FEM (TUBING) IMPLANT

## 2018-04-04 NOTE — Telephone Encounter (Signed)
Returned call to [place], [place] on file.  We cancelled pts appt for 04/07/18 since pt is still hospitalized and she is aware that when pt is d/c, they will arrange a f/u appt. She thanked me for the call back.

## 2018-04-04 NOTE — Progress Notes (Signed)
[facility] TEAM 1 - Stepdown/ICU TEAM  [name]  IOX:[id number] DOB: June 30, 1940 DOA: 03/18/2018 PCP: Lavone Orn, MD    Brief Narrative:  77 y.o. male with a hx of systolic heart failure, atrial flutter, recent GI bleed, CAD s/p CABG, hypothyroidism, DM, GERD, HTN, HLD, stage III/stage IV CKD, and PAD who was recently discharged from the hospital after a GI bleed who presented w/ generalized weakness, and inability to ambulate, and shortness of breath.    On presentation to the hospital, patient was noted to be in atrial fibrillation with rapid ventricular response, acute kidney injury on chronic kidney disease stage III/IV, acute on chronic combined systolic and diastolic congestive heart failure with recent echocardiogram revealing EF of 30 to 26%, grade 2 diastolic dysfunction and decreased right ventricular systolic function.  UA was suggestive of likely UTI and blood culture grew Klebsiella pneumonia, suspect likely secondary to UTI/sepsis.  Worsening mental status is noted.  Patient's volume overload is improving.  Right catheterization done earlier today revealed "Preserved cardiac output.  Severe pulmonary venous hypertension.  Right and left heart filling pressures remain elevated".   04/04/2018: Plan is to complete the course of IV meropenem, and then discharge patient home on the hospice care as per patient and patient's family preference.   Subjective: No fever chills. No chest pain. No worsening of shortness of breath.  Assessment & Plan: Acute exacerbation of chronic combined systolic and diastolic CHF: -Consult heart failure team. -Query diuresis resistant. -BUN of 120, serum creatinine of 3.3, and with likely uremia - query role of hemodialysis, but patient may not be a good candidate due to multiple cold morbidities. -Latest echo revealed EF of 30 to 35% (kindly see above). - Await further recommendation from the heart failure team. - Nephrology team also  consulted.  -03/31/18: Continue IV diuretics for now.  Continue to monitor renal function and electrolytes.  Case management/social work input appreciated regarding disposition.  We will continue to discuss disposition with patient and patient's family. 04/04/2018: Heart failure team is managing diuretics today.   Atrial flutter/Atrial Fib with rapid ventricular response: Cardiology is directing care  Eliquis was held during recent admit for GIB, but resumed at time of d/c  S/P TEE DCCV  UTI/bacteremia/sepsis, likely secondary to Klebsiella: 03/24/2018: Blood culture final result revealed Klebsiella pneumonia, ESBL. Discontinued IV Rocephin. Continue IV meropenem. Complete course of IV meropenem (2 weeks course).  Hypoalbuminemia:  May be indicative of poor prognosis.    Acute kidney injury on chronic kidney disease stage III/IV:  Nephrology input is appreciated.  As per nephrology team, not ideal candidate for dialysis. We will continue to assess. 04/04/2018: AKI has improved significantly.  Will repeat renal panel in the morning as patient was not exposed to contrast dye.  Continue to monitor renal function.  Overall, long-term prognosis remains guarded.  Recent Labs  Lab 03/31/18 0342 04/01/18 0250 04/02/18 0636 04/03/18 0427 04/04/18 0616  CREATININE 1.62* 1.66* 1.75* 1.96* 1.77*     Hypokalemia: Potassium is 3.4 today Cautious replacement considering impaired renal function.  Hyponatremia: Resolved.  DM2: Continue to optimize.  HLD  Anemia of chronic kidney disease and recent GIB Follow trend - Hgb stable    GIB Oct 2019 EGD 03/10/18 w/o clear source of bleeding   CAD s/p CABG Has suspected graft failure - not a candidate for cardiac cath given kidney disease   Cystic lesion of pancreas Noted on MRI abdom earlier this month - mildly elevated CA 19-9 not  felt to be worrisome per GI   Consulted palliative/Hospice team.   Plan is discharge patient home  with Hospice team in am.    DVT prophylaxis: eliquis  Code Status: FULL CODE Family Communication: spoke wife.    Disposition Plan: Will depend on hospital course.    Consultants:  [facility] Cardiology Heart failure team. Nephrology.  Antimicrobials:  IV Rocephin discontinued on 03/24/2018. IV meropenem  Objective: Blood pressure (!) 153/81, pulse 62, temperature 97.8 F (36.6 C), temperature source Oral, resp. rate 14, weight 111.2 kg, SpO2 93 %.  Intake/Output Summary (Last 24 hours) at 04/04/2018 2246 Last data filed at 04/04/2018 1922 Gross per 24 hour  Intake 210 ml  Output 950 ml  Net -740 ml   Filed Weights   04/02/18 0320 04/03/18 0457 04/04/18 0356  Weight: 112 kg 111.9 kg 111.2 kg    Examination: General: Awake and communicative.    Lungs: Decreased air entry. Cardiovascular: S1-S2. Abdomen: Nontender, obese, soft, BS+, no rebound Extremities: ++ BLE edema   CBC: Recent Labs  Lab 04/01/18 0250 04/02/18 0636 04/03/18 0427 04/04/18 0616  WBC 7.9 6.6 6.6 6.5  NEUTROABS 6.2  --   --   --   HGB 9.5* 9.2* 8.9* 9.2*  HCT 30.8* 31.0* 28.4* 29.7*  MCV 89.0 90.4 87.9 87.9  PLT 253 255 222 203   Basic Metabolic Panel: Recent Labs  Lab 04/01/18 0250 04/02/18 0636 04/03/18 0427 04/04/18 0616  NA 140 138 137 136  K 3.5 4.5 3.7 3.4*  CL 99 98 96* 94*  CO2 33* 35* 33* 32  GLUCOSE 89 106* 114* 119*  BUN 52* 47* 44* 44*  CREATININE 1.66* 1.75* 1.96* 1.77*  CALCIUM 9.2 9.1 9.0 9.2  MG 2.1  --   --   --   PHOS 2.3* 2.5 2.5 2.5   GFR: Estimated Creatinine Clearance: 43.6 mL/min (A) (by C-G formula based on SCr of 1.77 mg/dL (H)).  Liver Function Tests: Recent Labs  Lab 04/01/18 0250 04/02/18 0636 04/03/18 0427 04/04/18 0616  ALBUMIN 2.9* 2.9* 2.7* 2.9*    Cardiac Enzymes: No results for input(s): CKTOTAL, CKMB, CKMBINDEX, TROPONINI in the last 168 hours.  HbA1C: Hgb A1c MFr Bld  Date/Time Value Ref Range Status  10/21/2017 12:33 PM 6.2 (H) 4.8  - 5.6 % Final    Comment:    (NOTE) Pre diabetes:          5.7%-6.4% Diabetes:              >6.4% Glycemic control for   <7.0% adults with diabetes   09/24/2011 05:50 AM 6.4 (H) <5.7 % Final    Comment:    (NOTE)                                                                       According to the ADA Clinical Practice Recommendations for 2011, when HbA1c is used as a screening test:  >=6.5%   Diagnostic of Diabetes Mellitus           (if abnormal result is confirmed) 5.7-6.4%   Increased risk of developing Diabetes Mellitus References:Diagnosis and Classification of Diabetes Mellitus,Diabetes TDHR,4163,84(TXMIW 1):S62-S69 and Standards of Medical Care in  Diabetes - 2011,Diabetes Care,2011,34 (Suppl 1):S11-S61.    CBG: Recent Labs  Lab 04/03/18 2151 04/04/18 0738 04/04/18 1136 04/04/18 1710 04/04/18 2158  GLUCAP 131* 116* 116* 105* 147*    Scheduled Meds: . amiodarone  200 mg Oral Daily  . apixaban  5 mg Oral BID  . atorvastatin  20 mg Oral QPC supper  . carvedilol  3.125 mg Oral BID  . fenofibrate  54 mg Oral Daily  . furosemide  80 mg Intravenous BID  . gabapentin  100 mg Oral BID PC  . hydrALAZINE  25 mg Oral Q8H  . insulin aspart  0-20 Units Subcutaneous TID WC  . insulin aspart  0-5 Units Subcutaneous QHS  . insulin glargine  8 Units Subcutaneous QHS  . isosorbide mononitrate  30 mg Oral Daily  . levothyroxine  100 mcg Oral QAC breakfast  . pantoprazole  40 mg Oral Daily  . sodium chloride flush  3 mL Intravenous Q12H  . tamsulosin  0.4 mg Oral Q M,W,F   Continuous Infusions: . sodium chloride    . meropenem (MERREM) IV 1 g (04/04/18 0518)     LOS: 17 days   Bonnell Public, MD [facility] Office  [phone] Pager - Text Page per Amion  If 7PM-7AM, please contact night-coverage per Amion 04/04/2018, 10:46 PM

## 2018-04-04 NOTE — Progress Notes (Signed)
PT Cancellation Note  Patient Details Name: ANGELINO RUMERY MRN: [id number] DOB: 23-Dec-1940   Cancelled Treatment:    Reason Eval/Treat Not Completed: Patient at procedure or test/unavailable Pt off floor in cath lab. Will follow up as time allows.   Marguarite Arbour A Graylen [place]oa 04/04/2018, 12:57 PM Wray Kearns, PT, DPT Acute Rehabilitation Services Pager [phone] Office [phone]

## 2018-04-04 NOTE — Progress Notes (Signed)
Advanced Heart Failure Rounding Note  PCP-Cardiologist: Sinclair Grooms, MD   Subjective:    Patient remained stable over weekend.  Creatinine stable today at 1.77, now on po torsemide.  He has not been very active, denies dyspnea at rest.    Objective:   Weight Range: 111.2 kg Body mass index is 34.18 kg/m.   Vital Signs:   Temp:  [97.4 F (36.3 C)-98.7 F (37.1 C)] 97.9 F (36.6 C) (11/11 0741) Pulse Rate:  [56-72] 62 (11/11 0741) Resp:  [18-22] 18 (11/11 0741) BP: (135-150)/(54-74) 150/68 (11/11 0741) SpO2:  [94 %-100 %] 98 % (11/11 0741) Weight:  [111.2 kg] 111.2 kg (11/11 0356) Last BM Date: 03/28/18(Per Pt)  Weight change: Filed Weights   04/02/18 0320 04/03/18 0457 04/04/18 0356  Weight: 112 kg 111.9 kg 111.2 kg    Intake/Output:   Intake/Output Summary (Last 24 hours) at 04/04/2018 0820 Last data filed at 04/04/2018 0648 Gross per 24 hour  Intake 690 ml  Output 1400 ml  Net -710 ml    Physical Exam   General: NAD Neck: JVP 8-9 cm, no thyromegaly or thyroid nodule.  Lungs: Clear to auscultation bilaterally with normal respiratory effort. CV: Nondisplaced PMI.  Heart regular S1/S2, no S3/S4, no murmur.  Unna boots, 1+ edema to knees.   Abdomen: Soft, nontender, no hepatosplenomegaly, no distention.  Skin: Intact without lesions or rashes.  Neurologic: Drowsy but oriented.  Psych: Normal affect. Extremities: No clubbing or cyanosis.  HEENT: Normal.    Telemetry   NSR 50s, personally reviewed.   EKG    No new tracings.    Labs    CBC Recent Labs    04/03/18 0427 04/04/18 0616  WBC 6.6 6.5  HGB 8.9* 9.2*  HCT 28.4* 29.7*  MCV 87.9 87.9  PLT 222 962   Basic Metabolic Panel Recent Labs    04/03/18 0427 04/04/18 0616  NA 137 136  K 3.7 3.4*  CL 96* 94*  CO2 33* 32  GLUCOSE 114* 119*  BUN 44* 44*  CREATININE 1.96* 1.77*  CALCIUM 9.0 9.2  PHOS 2.5 2.5   Liver Function Tests Recent Labs    04/03/18 0427 04/04/18 0616   ALBUMIN 2.7* 2.9*   No results for input(s): LIPASE, AMYLASE in the last 72 hours. Cardiac Enzymes No results for input(s): CKTOTAL, CKMB, CKMBINDEX, TROPONINI in the last 72 hours.  BNP: BNP (last 3 results) Recent Labs    02/25/18 1340 03/18/18 1325 03/24/18 1019  BNP 1,809.7* 2,440.0* 1,916.1*    ProBNP (last 3 results) Recent Labs    02/03/18 1217 02/24/18 1243  PROBNP 10,666* 17,251*     D-Dimer No results for input(s): DDIMER in the last 72 hours. Hemoglobin A1C No results for input(s): HGBA1C in the last 72 hours. Fasting Lipid Panel No results for input(s): CHOL, HDL, LDLCALC, TRIG, CHOLHDL, LDLDIRECT in the last 72 hours. Thyroid Function Tests No results for input(s): TSH, T4TOTAL, T3FREE, THYROIDAB in the last 72 hours.  Invalid input(s): FREET3  Other results:   Imaging    No results found.   Medications:     Scheduled Medications: . amiodarone  200 mg Oral Daily  . atorvastatin  20 mg Oral QPC supper  . carvedilol  3.125 mg Oral BID  . fenofibrate  54 mg Oral Daily  . gabapentin  100 mg Oral BID PC  . hydrALAZINE  25 mg Oral Q8H  . insulin aspart  0-20 Units Subcutaneous TID WC  .  insulin aspart  0-5 Units Subcutaneous QHS  . insulin glargine  8 Units Subcutaneous QHS  . isosorbide mononitrate  30 mg Oral Daily  . levothyroxine  100 mcg Oral QAC breakfast  . pantoprazole  40 mg Oral Daily  . potassium chloride  40 mEq Oral Once  . sodium chloride flush  3 mL Intravenous Q12H  . tamsulosin  0.4 mg Oral Q M,W,F  . torsemide  60 mg Oral BID    Infusions: . sodium chloride    . sodium chloride 10 mL/hr at 04/03/18 2236  . meropenem (MERREM) IV 1 g (04/04/18 0518)    PRN Medications: sodium chloride, acetaminophen, calcium carbonate, camphor-menthol, hydrocortisone cream, nitroGLYCERIN, ondansetron (ZOFRAN) IV, sodium chloride flush  Patient Profile   [name] is a 77 year old with a history of chronic combined  systolic/diastolic heart failure, PAF, GI bleed, PAD, memory impairment, CAD, CABG, hypterlipidemia, CKD, DMII.   Admitted with increased fatigue and dyspnea.   Assessment/Plan   1. Acute on chronic systolic CHF: Ischemic cardiomyopathy, TEE this admission with EF 30-35%, moderately decreased systolic function.   TTE with EF 25-30%.  Earlier in hospital stay, he had AKI and tentative plan had been for hospice/comfort care.  However, renal function has gradually improved and he is doing better though still with NYHA class IIIb symptoms. He is not a candidate for advanced therapies with age and renal dysfunction.  Creatinine appears to have plateaued around 1.8, suspect mild volume overload at this point, now on po torsemide.  - RHC today, will assess filling pressures and cardiac output.  - Torsemide 60 mg po bid for now.   - Continue Coreg 3.125 mg bid.  - Can increase hydralazine to 25 mg tid and continue Imdur 30 daily for afterload reduction.  - No ACEi/ARNI/ARB/spironolactone/digoxin with AKI this admission and baseline CKD.  - Continue Unna boots.  - Cardiomems would be a consideration for him after discharge.   2. AKI on CKD stage 3-4: Earlier in hospitalization, he was uremic with BUN in 120s and creatinine > 3, but creatinine now down to 1.77.    - Now on po torsemide, follow creatinine closely.   3. Atrial fibrillation with RVR: Now in NSR s/p TEE-guided DCCV.  He is on amiodarone to maintain NSR and Eliquis.  - Eliquis held for RHC today, restart afterwards.  - Continue amiodarone 200 mg daily.   4. CAD: S/p CABG in 2013.  No chest pain. He is on atorvastatin.  5. ID: Klebsiella bacteremia, ?source.  CXR did not show definite PNA.  He is on meropenem, per primary team. 6. PAD: Extensive history.  Has had aorto-bifemoral bypass, left common femoral endarterectomy with fem-pop bypass.   Plan at this time is to aim to get him home with palliative care services and home health,  hopefully soon.  Loralie Champagne 04/04/2018 8:20 AM

## 2018-04-04 NOTE — Telephone Encounter (Addendum)
Spoke with Dr. Tamala Julian and he said that Hospice doctor or PCP would be the better option to be attending physician.  Dr. Tamala Julian said he would be glad to help out with anything cardiac related but priority should be given to one of them to ensure timely care as he is not always in the office.    Attempted to call Hospice and the line states they are there 830-5 to answer the phone.  Will call tomorrow.

## 2018-04-04 NOTE — Telephone Encounter (Signed)
° ° °  Amy from [facility] calling, family requesting Dr Tamala Julian to be attending. Please call Amy [phone]

## 2018-04-04 NOTE — Progress Notes (Signed)
Patient ID: [name], male   DOB: July 13, 1940, 77 y.o.   MRN: [id number]  This NP visited patient at the bedside for palliative needs and emotional support.  Patient is alert but appears weak  today but still with poor insight into the seriousness of his current medical situation.   Wife at bedside today she has no concerns and she tells me that the plan is for the patient go home with hospice services.  Attempted to answer several questions regarding hospice benefit.  Discussed with wife  the importance of continued conversation with her family and the medical providers regarding overall plan of care and treatment options,  ensuring decisions are within the context of the patients values and GOCs.   Discussed with Dr Marthenia Rolling,   family encouraged to call with questions or concerns.  Total time spent on the unit was 15 minutes  Greater than 50% of the time was spent in counseling and coordination of care  [name] NP  Palliative Medicine Team Team Phone # [phone] Pager [phone]

## 2018-04-04 NOTE — Telephone Encounter (Signed)
New Message         Patient's  Wife is calling to see if patient should keep the appointment on Thursday 04/07/2018 since he is having a heart procedure today? Pls call to advise.

## 2018-04-05 ENCOUNTER — Encounter (HOSPITAL_COMMUNITY): Payer: Self-pay | Admitting: Cardiology

## 2018-04-05 LAB — RENAL FUNCTION PANEL
ALBUMIN: 2.7 g/dL — AB (ref 3.5–5.0)
ANION GAP: 8 (ref 5–15)
BUN: 43 mg/dL — AB (ref 8–23)
CALCIUM: 9.3 mg/dL (ref 8.9–10.3)
CHLORIDE: 95 mmol/L — AB (ref 98–111)
CO2: 33 mmol/L — ABNORMAL HIGH (ref 22–32)
CREATININE: 2.03 mg/dL — AB (ref 0.61–1.24)
GFR calc Af Amer: 34 mL/min — ABNORMAL LOW (ref >60)
GFR, EST NON AFRICAN AMERICAN: 30 mL/min — AB (ref >60)
Glucose, Bld: 133 mg/dL — ABNORMAL HIGH (ref 70–99)
PHOSPHORUS: 3.1 mg/dL (ref 2.5–4.6)
POTASSIUM: 3.8 mmol/L (ref 3.5–5.1)
Sodium: 136 mmol/L (ref 135–145)

## 2018-04-05 LAB — CBC
HCT: 30.1 % — ABNORMAL LOW (ref 39.0–52.0)
Hemoglobin: 9.2 g/dL — ABNORMAL LOW (ref 13.0–17.0)
MCH: 27 pg (ref 26.0–34.0)
MCHC: 30.6 g/dL (ref 30.0–36.0)
MCV: 88.3 fL (ref 80.0–100.0)
Platelets: 203 10*3/uL (ref 150–400)
RBC: 3.41 MIL/uL — ABNORMAL LOW (ref 4.22–5.81)
RDW: 17.6 % — ABNORMAL HIGH (ref 11.5–15.5)
WBC: 5.6 10*3/uL (ref 4.0–10.5)
nRBC: 0 % (ref 0.0–0.2)

## 2018-04-05 LAB — GLUCOSE, CAPILLARY
GLUCOSE-CAPILLARY: 133 mg/dL — AB (ref 70–99)
GLUCOSE-CAPILLARY: 122 mg/dL — AB (ref 70–99)
Glucose-Capillary: 144 mg/dL — ABNORMAL HIGH (ref 70–99)
Glucose-Capillary: 140 mg/dL — ABNORMAL HIGH (ref 70–99)

## 2018-04-05 NOTE — Progress Notes (Signed)
PROGRESS NOTE    [name]  MVH:[id number] DOB: 1940-12-25 DOA: 03/18/2018 PCP: Lavone Orn, MD    Brief Narrative:  77 y.o.malewith a hx of systolic heart failure, atrial flutter, recent GI bleed, CAD s/p CABG,hypothyroidism,DM, GERD, HTN, HLD, stage III/stage IV CKD, and PAD who was recently discharged from the hospital after a GI bleed who presented w/ generalized weakness, and inability to ambulate, and shortness of breath.   On presentation to the hospital, patient was noted to be in atrial fibrillation with rapid ventricular response, acute kidney injury on chronic kidney disease stage III/IV, acute on chronic combined systolic and diastolic congestive heart failure with recent echocardiogram revealing EF of 30 to 84%, grade 2 diastolic dysfunction and decreased right ventricular systolic function.  UA was suggestive of likely UTI and blood culture grew Klebsiella pneumonia, suspect likely secondary to UTI/sepsis.  Worsening mental status is noted.  Patient's volume overload is improving.  Right catheterization done earlier today revealed "Preserved cardiac output.  Severe pulmonary venous hypertension.  Right and left heart filling pressures remain elevated".   04/04/2018: Plan is to complete the course of IV meropenem, and then discharge patient home on the hospice care as per patient and patient's family preference.   Assessment & Plan:   Active Problems:   Diabetes mellitus type 2 with complications (HCC)   S/P CABG (coronary artery bypass graft)   Essential hypertension   Chronic kidney disease, stage III (moderate) (HCC)   Volume overload   Benign essential HTN   New onset atrial fibrillation (HCC)   Hyponatremia   GI bleed   Symptomatic anemia   Hyperlipidemia   Combined systolic and diastolic heart failure (HCC)   Acute on chronic congestive heart failure (HCC)   AKI (acute kidney injury) ([place])   DNR (do not resuscitate) discussion   Palliative care  by specialist   Transient alteration of awareness   Weakness generalized  Acute exacerbation of chronic combined systolic and diastolic CHF: -Heart failure team following -Latest echo revealed EF of 30 to 35% (kindly see above). - Nephrology team was consulted earlier consulted.  -03/31/18: Continue IV diuretics for now.  Continue to monitor renal function and electrolytes.  Case management/social work input appreciated regarding disposition. -plans for d/c to home with hospice -Per Cardiology, did not receive IV lasix yesterday, to continue with IV lasix today, possible able to d/c tomorrow  Atrial flutter/Atrial Fib with rapid ventricular response: -Cardiology is directing care  -Eliquis was held during recent admit for GIB, but resumed at time of d/c  S/P TEE DCCV -Stable at this time  UTI/bacteremia/sepsis, likely secondary to Klebsiella: -03/24/2018: Blood culture final result revealed Klebsiella pneumonia, ESBL. - Discontinued IV Rocephin. -Continue IV meropenem. -Complete course of IV meropenem (2 weeks course), complete abx after 11/13 dose  Hypoalbuminemia:  -Stable at this time.    Acute kidney injury on chronic kidney disease stage III/IV:  -Nephrology input is appreciated.  -Seen by Nephrology, not ideal candidate for dialysis. -As of 04/04/2018: AKI has improved significantly.   -repeat bmet in AM  DVT prophylaxis: Eliquis Code Status: No CPR, No intubation, no ACLS meds, no cardioversion Family Communication: Pt in room, family not at bedside Disposition Plan: Possible home with hospice in 24hrs after finishing IV abx  Consultants:   Cardiology  Nephrology  Procedures:     Antimicrobials: Anti-infectives (From admission, onward)   Start     Dose/Rate Route Frequency Ordered Stop   03/24/18 1700  meropenem (MERREM)  1 g in sodium chloride 0.9 % 100 mL IVPB     1 g 200 mL/hr over 30 Minutes Intravenous Every 12 hours 03/24/18 1613 04/06/18 2359    03/22/18 2000  cefTRIAXone (ROCEPHIN) 2 g in sodium chloride 0.9 % 100 mL IVPB  Status:  Discontinued     2 g 200 mL/hr over 30 Minutes Intravenous Every 24 hours 03/22/18 1915 03/24/18 1613   03/21/18 2000  ceFEPIme (MAXIPIME) 1 g in sodium chloride 0.9 % 100 mL IVPB  Status:  Discontinued     1 g 200 mL/hr over 30 Minutes Intravenous Every 24 hours 03/20/18 1856 03/22/18 1915   03/20/18 1845  ceFEPIme (MAXIPIME) 2 g in sodium chloride 0.9 % 100 mL IVPB     2 g 200 mL/hr over 30 Minutes Intravenous  Once 03/20/18 1833 03/21/18 2002       Subjective: Eager to go home  Objective: Vitals:   04/05/18 0035 04/05/18 0631 04/05/18 0800 04/05/18 1451  BP: (!) 147/59 (!) 144/57 (!) 155/61 (!) 149/57  Pulse: 60 61 60   Resp: 18 17 18    Temp:  97.9 F (36.6 C) 98.2 F (36.8 C)   TempSrc:  Oral Oral   SpO2: 98% 97% 100%   Weight: 108.4 kg 108.4 kg      Intake/Output Summary (Last 24 hours) at 04/05/2018 1515 Last data filed at 04/05/2018 0940 Gross per 24 hour  Intake 340 ml  Output 1225 ml  Net -885 ml   Filed Weights   04/04/18 0356 04/05/18 0035 04/05/18 0631  Weight: 111.2 kg 108.4 kg 108.4 kg    Examination: General exam: Awake, sitting in chair Respiratory system: Normal respiratory effort, no wheezing Cardiovascular system: regular rate, s1, s2 Gastrointestinal system: Soft, nondistended, positive BS Central nervous system: CN2-12 grossly intact, strength intact Extremities: Perfused, no clubbing Skin: Normal skin turgor, no notable skin lesions seen Psychiatry: Mood normal // no visual hallucinations   Data Reviewed: I have personally reviewed following labs and imaging studies  CBC: Recent Labs  Lab 04/01/18 0250 04/02/18 0636 04/03/18 0427 04/04/18 0616 04/05/18 0421  WBC 7.9 6.6 6.6 6.5 5.6  NEUTROABS 6.2  --   --   --   --   HGB 9.5* 9.2* 8.9* 9.2* 9.2*  HCT 30.8* 31.0* 28.4* 29.7* 30.1*  MCV 89.0 90.4 87.9 87.9 88.3  PLT 253 255 222 224 546    Basic Metabolic Panel: Recent Labs  Lab 04/01/18 0250 04/02/18 0636 04/03/18 0427 04/04/18 0616 04/05/18 0421  NA 140 138 137 136 136  K 3.5 4.5 3.7 3.4* 3.8  CL 99 98 96* 94* 95*  CO2 33* 35* 33* 32 33*  GLUCOSE 89 106* 114* 119* 133*  BUN 52* 47* 44* 44* 43*  CREATININE 1.66* 1.75* 1.96* 1.77* 2.03*  CALCIUM 9.2 9.1 9.0 9.2 9.3  MG 2.1  --   --   --   --   PHOS 2.3* 2.5 2.5 2.5 3.1   GFR: Estimated Creatinine Clearance: 37.5 mL/min (A) (by C-G formula based on SCr of 2.03 mg/dL (H)). Liver Function Tests: Recent Labs  Lab 04/01/18 0250 04/02/18 0636 04/03/18 0427 04/04/18 0616 04/05/18 0421  ALBUMIN 2.9* 2.9* 2.7* 2.9* 2.7*   No results for input(s): LIPASE, AMYLASE in the last 168 hours. No results for input(s): AMMONIA in the last 168 hours. Coagulation Profile: No results for input(s): INR, PROTIME in the last 168 hours. Cardiac Enzymes: No results for input(s): CKTOTAL, CKMB, CKMBINDEX, TROPONINI in  the last 168 hours. BNP (last 3 results) Recent Labs    02/03/18 1217 02/24/18 1243  PROBNP 10,666* 17,251*   HbA1C: No results for input(s): HGBA1C in the last 72 hours. CBG: Recent Labs  Lab 04/04/18 1136 04/04/18 1710 04/04/18 2158 04/05/18 0741 04/05/18 1200  GLUCAP 116* 105* 147* 144* 133*   Lipid Profile: No results for input(s): CHOL, HDL, LDLCALC, TRIG, CHOLHDL, LDLDIRECT in the last 72 hours. Thyroid Function Tests: No results for input(s): TSH, T4TOTAL, FREET4, T3FREE, THYROIDAB in the last 72 hours. Anemia Panel: No results for input(s): VITAMINB12, FOLATE, FERRITIN, TIBC, IRON, RETICCTPCT in the last 72 hours. Sepsis Labs: No results for input(s): PROCALCITON, LATICACIDVEN in the last 168 hours.  No results found for this or any previous visit (from the past 240 hour(s)).   Radiology Studies: No results found.  Scheduled Meds: . amiodarone  200 mg Oral Daily  . apixaban  5 mg Oral BID  . atorvastatin  20 mg Oral QPC supper  .  carvedilol  3.125 mg Oral BID  . fenofibrate  54 mg Oral Daily  . furosemide  80 mg Intravenous BID  . gabapentin  100 mg Oral BID PC  . hydrALAZINE  25 mg Oral Q8H  . insulin aspart  0-20 Units Subcutaneous TID WC  . insulin aspart  0-5 Units Subcutaneous QHS  . insulin glargine  8 Units Subcutaneous QHS  . isosorbide mononitrate  30 mg Oral Daily  . levothyroxine  100 mcg Oral QAC breakfast  . pantoprazole  40 mg Oral Daily  . sodium chloride flush  3 mL Intravenous Q12H  . tamsulosin  0.4 mg Oral Q M,W,F   Continuous Infusions: . sodium chloride    . meropenem (MERREM) IV 1 g (04/05/18 0535)     LOS: 18 days   Marylu Lund, MD [facility] Pager On Amion  If 7PM-7AM, please contact night-coverage 04/05/2018, 3:15 PM

## 2018-04-05 NOTE — Telephone Encounter (Signed)
Spoke with Amy with [facility] and made her aware.  Amy appreciative for call.

## 2018-04-05 NOTE — Progress Notes (Signed)
Physical Therapy Treatment Patient Details Name: [name] MRN: [id number] DOB: 09-23-1940 Today's Date: 04/05/2018    History of Present Illness Pt is a 77 y.o. male admitted 03/18/18 with weakness and SOB; worked up for CHF exacerbation. Pt also noted to be in a-fib with RVR, AKI on CKD III/IV, and with AMS. S/p TEE/DCCV 10/29. Blood culture grew Klebsiella ESBL on 10/31. PMH includes fem/pop (June 2019), cellulitis, CAD, s/p CABG, CHF, PAD, HTN, CKD.    PT Comments    Pt admitted with above diagnosis. Pt currently with functional limitations due to balance and endurance deficits. Pt met 3/5 goals.  Goals revised today with pt making good progress since last visit increasing gait distance today.  Wife present.  Will continue to progress pt as pt tolerates.    Pt will benefit from skilled PT to increase their independence and safety with mobility to allow discharge to the venue listed below.    Follow Up Recommendations  Home health PT;Supervision/Assistance - 24 hour( HHOT, HHAide or palliative/hospice per family request)     Equipment Recommendations  (hospital bed, bariatric wheelchair, bariatric 3-in-1)    Recommendations for Other Services       Precautions / Restrictions Precautions Precautions: Fall Required Braces or Orthoses: (per notes L AFO) Restrictions Weight Bearing Restrictions: No    Mobility  Bed Mobility               General bed mobility comments: Received sitting in recliner  Transfers Overall transfer level: Needs assistance Equipment used: Rolling walker (2 wheeled) Transfers: Sit to/from Stand Sit to Stand: Mod assist;+2 safety/equipment         General transfer comment: Heavy reliance on momentum to power into standing, requiring mod A for trunk elevation and to maintain balance when transitioning BUEs from chair armrests to RW.  Incr postural stability today.   Ambulation/Gait Ambulation/Gait assistance: Min assist;+2  safety/equipment Gait Distance (Feet): 75 Feet(10 feet to sink for ADL, 65 feet in hallway) Assistive device: Rolling walker (2 wheeled) Gait Pattern/deviations: Step-to pattern;Trunk flexed;Wide base of support Gait velocity: Decreased Gait velocity interpretation: <1.31 ft/sec, indicative of household ambulator General Gait Details: Amb 10 feet to sink with RW and min guard A for balance. Stood 5 min and brushed teeth leaning on elbows on sink and at times did not have bil UE support terefore can stand statically for seconds at a time.   Pt took seated rest break and then ambulated with RW in hallway with cues for upright posture as he tends to flex at trunk.     Stairs             Wheelchair Mobility    Modified Rankin (Stroke Patients Only)       Balance Overall balance assessment: Needs assistance Sitting-balance support: Bilateral upper extremity supported;Feet supported Sitting balance-Leahy Scale: Fair     Standing balance support: Bilateral upper extremity supported;During functional activity Standing balance-Leahy Scale: Poor Standing balance comment: Pt able to stand statically wihtout UE support for seconds at a time.  Pt needed bil UE support for dynamic gait.                             Cognition Arousal/Alertness: Awake/alert Behavior During Therapy: Flat affect Overall Cognitive Status: History of cognitive impairments - at baseline Area of Impairment: Safety/judgement;Awareness;Attention;Problem solving  Current Attention Level: Selective     Safety/Judgement: Decreased awareness of safety;Decreased awareness of deficits Awareness: Emergent Problem Solving: Slow processing;Decreased initiation General Comments: Pt continues with 3 - 5 second delay in following commands.  Continues to need cues for safety.       Exercises General Exercises - Lower Extremity Ankle Circles/Pumps: AROM;Both;Supine;10 reps Quad Sets:  AROM;Both;10 reps;Seated Heel Slides: AROM;Both;5 reps;Supine    General Comments General comments (skin integrity, edema, etc.): Wife present.  VSS during treatment with pt on 3LO2.       Pertinent Vitals/Pain Pain Assessment: No/denies pain    Home Living                      Prior Function            PT Goals (current goals can now be found in the care plan section) Acute Rehab PT Goals Patient Stated Goal: Pt's goal is to return home asap  PT Goal Formulation: With patient Time For Goal Achievement: 04/20/18 Potential to Achieve Goals: Fair Progress towards PT goals: Progressing toward goals    Frequency    Min 3X/week      PT Plan Discharge plan needs to be updated    Co-evaluation PT/OT/SLP Co-Evaluation/Treatment: Yes Reason for Co-Treatment: For patient/therapist safety PT goals addressed during session: Mobility/safety with mobility        AM-PAC PT "6 Clicks" Daily Activity  Outcome Measure  Difficulty turning over in bed (including adjusting bedclothes, sheets and blankets)?: Unable Difficulty moving from lying on back to sitting on the side of the bed? : Unable Difficulty sitting down on and standing up from a chair with arms (e.g., wheelchair, bedside commode, etc,.)?: A Lot Help needed moving to and from a bed to chair (including a wheelchair)?: A Lot Help needed walking in hospital room?: A Little Help needed climbing 3-5 steps with a railing? : Total 6 Click Score: 10    End of Session Equipment Utilized During Treatment: Gait belt;Oxygen Activity Tolerance: Patient tolerated treatment well;Patient limited by fatigue Patient left: with family/visitor present;in chair;with call bell/phone within reach;with chair alarm set Nurse Communication: Mobility status PT Visit Diagnosis: Unsteadiness on feet (R26.81);Other abnormalities of gait and mobility (R26.89);Muscle weakness (generalized) (M62.81);Repeated falls (R29.6);Difficulty in  walking, not elsewhere classified (R26.2)     Time: 1552-0802 PT Time Calculation (min) (ACUTE ONLY): 34 min  Charges:  $Gait Training: 8-22 mins                     [place] Pager:  [phone]  Office:  [place] 04/05/2018, 11:58 AM

## 2018-04-05 NOTE — Progress Notes (Signed)
Patient ID: [name], male   DOB: 16-Jul-1940, 77 y.o.   MRN: [id number]     Advanced Heart Failure Rounding Note  PCP-Cardiologist: Sinclair Grooms, MD   Subjective:    Patient does not appear to have received diuretics yesterday despite orders.  Creatinine up a bit to 2 with stable BUN.  He is short of breath walking, comfortable at rest.  Ready to go home.   RHC Procedural Findings: Hemodynamics (mmHg) RA mean 12 RV 70/16 PA 71/25, mean 41 PCWP mean 26 with prominent v-waves to 43 mmHg Oxygen saturations: PA 67% AO 98% Cardiac Output (Fick) 7.91  Cardiac Index (Fick) 3.43 PVR 1.9 WU    Objective:   Weight Range: 108.4 kg Body mass index is 33.32 kg/m.   Vital Signs:   Temp:  [97.8 F (36.6 C)-97.9 F (36.6 C)] 97.9 F (36.6 C) (11/12 0631) Pulse Rate:  [0-227] 61 (11/12 0631) Resp:  [0-35] 17 (11/12 0631) BP: (144-161)/(51-81) 144/57 (11/12 0631) SpO2:  [0 %-100 %] 97 % (11/12 0631) Weight:  [108.4 kg] 108.4 kg (11/12 0631) Last BM Date: 04/05/18  Weight change: Filed Weights   04/04/18 0356 04/05/18 0035 04/05/18 0631  Weight: 111.2 kg 108.4 kg 108.4 kg    Intake/Output:   Intake/Output Summary (Last 24 hours) at 04/05/2018 0845 Last data filed at 04/05/2018 0600 Gross per 24 hour  Intake 240 ml  Output 1225 ml  Net -985 ml    Physical Exam   General: NAD Neck: JVP difficult, ?10 cm, no thyromegaly or thyroid nodule.  Lungs: Clear to auscultation bilaterally with normal respiratory effort. CV: Nondisplaced PMI.  Heart regular S1/S2, no S3/S4, no murmur. 1+ edema to knees with Unna boots.   Abdomen: Soft, nontender, no hepatosplenomegaly, no distention.  Skin: Intact without lesions or rashes.  Neurologic: Alert and oriented x 3.  Psych: Normal affect. Extremities: No clubbing or cyanosis.  HEENT: Normal.     Telemetry   NSR 60s, personally reviewed.   EKG    No new tracings.    Labs    CBC Recent Labs    04/04/18 0616  04/05/18 0421  WBC 6.5 5.6  HGB 9.2* 9.2*  HCT 29.7* 30.1*  MCV 87.9 88.3  PLT 224 220   Basic Metabolic Panel Recent Labs    04/04/18 0616 04/05/18 0421  NA 136 136  K 3.4* 3.8  CL 94* 95*  CO2 32 33*  GLUCOSE 119* 133*  BUN 44* 43*  CREATININE 1.77* 2.03*  CALCIUM 9.2 9.3  PHOS 2.5 3.1   Liver Function Tests Recent Labs    04/04/18 0616 04/05/18 0421  ALBUMIN 2.9* 2.7*   No results for input(s): LIPASE, AMYLASE in the last 72 hours. Cardiac Enzymes No results for input(s): CKTOTAL, CKMB, CKMBINDEX, TROPONINI in the last 72 hours.  BNP: BNP (last 3 results) Recent Labs    02/25/18 1340 03/18/18 1325 03/24/18 1019  BNP 1,809.7* 2,440.0* 1,916.1*    ProBNP (last 3 results) Recent Labs    02/03/18 1217 02/24/18 1243  PROBNP 10,666* 17,251*     D-Dimer No results for input(s): DDIMER in the last 72 hours. Hemoglobin A1C No results for input(s): HGBA1C in the last 72 hours. Fasting Lipid Panel No results for input(s): CHOL, HDL, LDLCALC, TRIG, CHOLHDL, LDLDIRECT in the last 72 hours. Thyroid Function Tests No results for input(s): TSH, T4TOTAL, T3FREE, THYROIDAB in the last 72 hours.  Invalid input(s): FREET3  Other results:   Imaging  No results found.   Medications:     Scheduled Medications: . amiodarone  200 mg Oral Daily  . apixaban  5 mg Oral BID  . atorvastatin  20 mg Oral QPC supper  . carvedilol  3.125 mg Oral BID  . fenofibrate  54 mg Oral Daily  . furosemide  80 mg Intravenous BID  . gabapentin  100 mg Oral BID PC  . hydrALAZINE  25 mg Oral Q8H  . insulin aspart  0-20 Units Subcutaneous TID WC  . insulin aspart  0-5 Units Subcutaneous QHS  . insulin glargine  8 Units Subcutaneous QHS  . isosorbide mononitrate  30 mg Oral Daily  . levothyroxine  100 mcg Oral QAC breakfast  . pantoprazole  40 mg Oral Daily  . sodium chloride flush  3 mL Intravenous Q12H  . tamsulosin  0.4 mg Oral Q M,W,F    Infusions: . sodium  chloride    . meropenem (MERREM) IV 1 g (04/05/18 0535)    PRN Medications: sodium chloride, acetaminophen, calcium carbonate, camphor-menthol, hydrocortisone cream, nitroGLYCERIN, ondansetron (ZOFRAN) IV, sodium chloride flush  Patient Profile   Mr Redmann is a 77 year old with a history of chronic combined systolic/diastolic heart failure, PAF, GI bleed, PAD, memory impairment, CAD, CABG, hypterlipidemia, CKD, DMII.   Admitted with increased fatigue and dyspnea.   Assessment/Plan   1. Acute on chronic systolic CHF: Ischemic cardiomyopathy, TEE this admission with EF 30-35%, moderately decreased systolic function.   TTE with EF 25-30%.  Earlier in hospital stay, he had AKI and tentative plan had been for hospice/comfort care.  However, renal function has gradually improved and he is doing better though still with NYHA class IIIb symptoms. He is not a candidate for advanced therapies with age and renal dysfunction.  Creatinine mildly higher at 2 though BUN down.  RHC yesterday with ongoing volume overload but preserved cardiac output. It does not appear that he actually received any of the ordered diuretics yesterday.  - Lasix 80 mg IV bid today.  Will try to get him on po diuretics and home with palliative services tomorrow.    - Continue Coreg 3.125 mg bid.  - Continue hydralazine to 25 mg tid and Imdur 30 daily for afterload reduction.  - No ACEi/ARNI/ARB/spironolactone/digoxin with AKI this admission and baseline CKD.  - Continue Unna boots.  - Cardiomems would be a consideration for him after discharge depending on trajectory.  If he proceeds with hospice, would not pursue this.   2. AKI on CKD stage 3-4: Earlier in hospitalization, he was uremic with BUN in 120s and creatinine > 3, but creatinine now down to around 2.    - Follow creatinine closely with diuresis today.  3. Atrial fibrillation with RVR: Now in NSR s/p TEE-guided DCCV.  He is on amiodarone to maintain NSR and Eliquis.    - Continue Eliquis.  - Continue amiodarone 200 mg daily.   4. CAD: S/p CABG in 2013.  No chest pain. He is on atorvastatin.  5. ID: Klebsiella bacteremia, ?source.  CXR did not show definite PNA.  He is on meropenem, per primary team. 6. PAD: Extensive history.  Has had aorto-bifemoral bypass, left common femoral endarterectomy with fem-pop bypass.   Plan at this time is to aim to get him home with palliative care services and home health, possibly tomorrow.  Not sure that we will be able to diurese him to euvolemia, he is willing to get IV diuretics at least today.  Loralie Champagne 04/05/2018 8:45 AM

## 2018-04-05 NOTE — Progress Notes (Addendum)
[facility])    Met with patient in the room, his wife [name] was not available.  Patient alert and oriented, stated he was comfortable and denied any pain.  Tentative plan to discharge home with hospice services on 11/13, once patient has been diuresed.    Spoke with [name] via phone, advised they have a BSC commode and walker, will need bed with rails, bedside table, bariatric wheelchair and possibly oxygen.  Pt currently on O2 @ 2lpm, but no discussions about needing O2 at home as of yet.    Will order needed DME.  Patient will need transportation arranged to return home.   Once pt is ready to discharge please fax discharge summary to [phone].  Please notify [facility] when patient is ready to leave the unit at discharge, [phone] M-F 830-5p, after 5p [phone].    Please call with hospice related questions.  Thank you for this referral.  Venia Carbon BSN, RN [facility] Liaison (listed in [place]) [phone]

## 2018-04-05 NOTE — Progress Notes (Signed)
Occupational Therapy Treatment Patient Details Name: [name] MRN: [id number] DOB: [birth year] Today's Date: 04/05/2018    History of present illness Pt is a 77 y.o. male admitted 03/18/18 with weakness and SOB; worked up for CHF exacerbation. Pt also noted to be in a-fib with RVR, AKI on CKD III/IV, and with AMS. S/p TEE/DCCV 10/29. Blood culture grew Klebsiella ESBL on 10/31. PMH includes fem/pop (June 2019), cellulitis, CAD, s/p CABG, CHF, PAD, HTN, CKD.   OT comments  Pt completed sink level adls this session and able to sustain standing with heavy UE use on sink surface. Pt eager to participate this session and pushing himself to the point of bil UE tremors from fatigue.    Follow Up Recommendations  SNF    Equipment Recommendations  Wheelchair cushion (measurements OT);Wheelchair (measurements OT)    Recommendations for Other Services      Precautions / Restrictions Precautions Precautions: Fall Precaution Comments: watch o2 Required Braces or Orthoses: (per notes L AFO) Restrictions Weight Bearing Restrictions: No       Mobility Bed Mobility               General bed mobility comments: Received sitting in recliner  Transfers Overall transfer level: Needs assistance Equipment used: Rolling walker (2 wheeled) Transfers: Sit to/from Stand Sit to Stand: Mod assist;+2 safety/equipment         General transfer comment: Heavy reliance on momentum to power into standing, requiring mod A for trunk elevation and to maintain balance when transitioning BUEs from chair armrests to RW.  Incr postural stability today.     Balance Overall balance assessment: Needs assistance Sitting-balance support: Bilateral upper extremity supported;Feet supported Sitting balance-Leahy Scale: Fair     Standing balance support: Bilateral upper extremity supported;During functional activity Standing balance-Leahy Scale: Poor Standing balance comment: Pt able to stand  statically without UE support for seconds at a time.  Pt needed bil UE support for dynamic gait.                            ADL either performed or assessed with clinical judgement   ADL Overall ADL's : Needs assistance/impaired     Grooming: Oral care;Wash/dry face;Min guard;Standing Grooming Details (indicate cue type and reason): pt able to open tube and apply paste to brush. pt fatigues adn requires bil elbows on sink surface to complete                                      Vision       Perception     Praxis      Cognition Arousal/Alertness: Awake/alert Behavior During Therapy: Flat affect Overall Cognitive Status: History of cognitive impairments - at baseline Area of Impairment: Safety/judgement;Awareness;Attention;Problem solving                   Current Attention Level: Selective     Safety/Judgement: Decreased awareness of safety;Decreased awareness of deficits Awareness: Emergent Problem Solving: Slow processing;Decreased initiation General Comments: Pt continues with 3 - 5 second delay in following commands.  Continues to need cues for safety.         Exercises Exercises: General Lower Extremity General Exercises - Lower Extremity Ankle Circles/Pumps: AROM;Both;Supine;10 reps Quad Sets: AROM;Both;10 reps;Seated Heel Slides: AROM;Both;5 reps;Supine   Shoulder Instructions       General Comments wife present throughout  session. pt with noticeable less edema in body arms and legs. pt requesting wrap removal on legs and advised to leave wraps at this time.     Pertinent Vitals/ Pain       Pain Assessment: No/denies pain  Home Living                                          Prior Functioning/Environment              Frequency  Min 2X/week        Progress Toward Goals  OT Goals(current goals can now be found in the care plan section)  Progress towards OT goals: Progressing toward  goals  Acute Rehab OT Goals Patient Stated Goal: Pt's goal is to return home asap  OT Goal Formulation: With patient Time For Goal Achievement: 04/19/18 Potential to Achieve Goals: Fair ADL Goals Pt Will Perform Grooming: with set-up;sitting Pt Will Perform Upper Body Bathing: with set-up;sitting Pt Will Perform Upper Body Dressing: with set-up;sitting Pt Will Perform Lower Body Dressing: with min assist;sit to/from stand Pt Will Transfer to Toilet: with min guard assist;ambulating;bedside commode  Plan Discharge plan remains appropriate    Co-evaluation    PT/OT/SLP Co-Evaluation/Treatment: Yes Reason for Co-Treatment: For patient/therapist safety PT goals addressed during session: Mobility/safety with mobility OT goals addressed during session: ADL's and self-care;Proper use of Adaptive equipment and DME;Strengthening/ROM      AM-PAC PT "6 Clicks" Daily Activity     Outcome Measure   Help from another person eating meals?: A Lot Help from another person taking care of personal grooming?: A Lot Help from another person toileting, which includes using toliet, bedpan, or urinal?: Total Help from another person bathing (including washing, rinsing, drying)?: A Lot Help from another person to put on and taking off regular upper body clothing?: A Lot Help from another person to put on and taking off regular lower body clothing?: A Lot 6 Click Score: 11    End of Session Equipment Utilized During Treatment: Gait belt;Rolling walker  OT Visit Diagnosis: Muscle weakness (generalized) (M62.81) Pain - Right/Left: Left Pain - part of body: Ankle and joints of foot   Activity Tolerance Patient tolerated treatment well;Patient limited by fatigue   Patient Left in chair;with call bell/phone within reach;with family/visitor present;with chair alarm set   Nurse Communication Mobility status;Patient requests pain meds        Time: 2248-2500 OT Time Calculation (min): 23  min  Charges: OT General Charges $OT Visit: 1 Visit OT Treatments $Self Care/Home Management : 8-22 mins   Jeri Modena, OTR/L  Acute Rehabilitation Services Pager: [phone] Office: [phone] .    Parke Poisson B 04/05/2018, 2:12 PM

## 2018-04-06 ENCOUNTER — Telehealth (HOSPITAL_COMMUNITY): Payer: Self-pay

## 2018-04-06 ENCOUNTER — Telehealth (HOSPITAL_COMMUNITY): Payer: Self-pay | Admitting: Surgery

## 2018-04-06 LAB — RENAL FUNCTION PANEL
Albumin: 2.5 g/dL — ABNORMAL LOW (ref 3.5–5.0)
Anion gap: 7 (ref 5–15)
BUN: 48 mg/dL — AB (ref 8–23)
CALCIUM: 9.3 mg/dL (ref 8.9–10.3)
CO2: 33 mmol/L — ABNORMAL HIGH (ref 22–32)
CREATININE: 1.91 mg/dL — AB (ref 0.61–1.24)
Chloride: 96 mmol/L — ABNORMAL LOW (ref 98–111)
GFR calc Af Amer: 37 mL/min — ABNORMAL LOW (ref >60)
GFR, EST NON AFRICAN AMERICAN: 32 mL/min — AB (ref >60)
Glucose, Bld: 111 mg/dL — ABNORMAL HIGH (ref 70–99)
Phosphorus: 3.1 mg/dL (ref 2.5–4.6)
Potassium: 3.4 mmol/L — ABNORMAL LOW (ref 3.5–5.1)
SODIUM: 136 mmol/L (ref 135–145)

## 2018-04-06 LAB — GLUCOSE, CAPILLARY
Glucose-Capillary: 102 mg/dL — ABNORMAL HIGH (ref 70–99)
Glucose-Capillary: 152 mg/dL — ABNORMAL HIGH (ref 70–99)

## 2018-04-06 LAB — CBC
HCT: 27.4 % — ABNORMAL LOW (ref 39.0–52.0)
Hemoglobin: 8.2 g/dL — ABNORMAL LOW (ref 13.0–17.0)
MCH: 26.2 pg (ref 26.0–34.0)
MCHC: 29.9 g/dL — AB (ref 30.0–36.0)
MCV: 87.5 fL (ref 80.0–100.0)
NRBC: 0 % (ref 0.0–0.2)
PLATELETS: 200 10*3/uL (ref 150–400)
RBC: 3.13 MIL/uL — ABNORMAL LOW (ref 4.22–5.81)
RDW: 17.6 % — AB (ref 11.5–15.5)
WBC: 5 10*3/uL (ref 4.0–10.5)

## 2018-04-06 MED ORDER — ISOSORBIDE MONONITRATE ER 30 MG PO TB24: 30.0000 mg | ORAL_TABLET | Freq: Every day | ORAL | 1 refills | Status: AC

## 2018-04-06 MED ORDER — TORSEMIDE 20 MG PO TABS: 80.0000 mg | ORAL_TABLET | Freq: Every day | ORAL | 1 refills | Status: DC

## 2018-04-06 MED ORDER — CARVEDILOL 3.125 MG PO TABS: 3.1250 mg | ORAL_TABLET | Freq: Two times a day (BID) | ORAL | 1 refills | Status: AC

## 2018-04-06 MED ORDER — TORSEMIDE 20 MG PO TABS: 80.0000 mg | ORAL_TABLET | Freq: Every day | ORAL | Status: DC

## 2018-04-06 MED ORDER — TORSEMIDE 20 MG PO TABS: 60.0000 mg | ORAL_TABLET | Freq: Every evening | ORAL | Status: DC

## 2018-04-06 MED ORDER — HYDRALAZINE HCL 25 MG PO TABS: 25.0000 mg | ORAL_TABLET | Freq: Three times a day (TID) | ORAL | 1 refills | Status: AC

## 2018-04-06 MED ORDER — POTASSIUM CHLORIDE CRYS ER 20 MEQ PO TBCR
40.0000 meq | EXTENDED_RELEASE_TABLET | Freq: Once | ORAL | Status: AC
  Administered 2018-04-06: 40 meq via ORAL
  Filled 2018-04-06: qty 2

## 2018-04-06 NOTE — Discharge Instructions (Signed)
Hospice Introduction Hospice is a service that is designed to provide people who are terminally ill and their families with medical, spiritual, and psychological support. Its aim is to improve your quality of life by keeping you as alert and comfortable as possible. Who will be my providers when I begin hospice care? Hospice teams often include:  A nurse.  A doctor. The hospice doctor will be available for your care, but you can bring your regular doctor or nurse practitioner.  Social workers.  Religious leaders (such as a Clinical biochemist).  Trained volunteers.  What roles will providers play in my care? Hospice is performed by a team of health care professionals and volunteers who:  Help keep you comfortable: ? Hospice can be provided in your home or in a homelike setting. ? The hospice staff works with your family and friends to help meet your needs. ? You will enjoy the support of loved ones by receiving much of your basic care from family and friends.  Provide pain relief and manage your symptoms. The staff supply all necessary medicines and equipment.  Provide companionship when you are alone.  Allow you and your family to rest. They may do light housekeeping, prepare meals, and run errands.  Provide counseling. They will make sure your emotional, spiritual, and social needs and those of your family are being met.  Provide spiritual care: ? Spiritual care will be individualized to meet your needs and your family's needs. ? Spiritual care may involve:  Helping you look at what death means to you.  Helping you say goodbye to your family and friends.  Performing a specific religious ceremony or ritual.  When should hospice care begin? Most people who use hospice are believed to have fewer than 6 months to live.  Your family and health care providers can help you decide when hospice services should begin.  If your condition improves, you may discontinue the program.  What  should I consider before selecting a program? Most hospice programs are run by nonprofit, independent organizations. Some are affiliated with hospitals, nursing homes, or home health care agencies. Hospice programs can take place in the home or at a hospice center, hospital, or skilled nursing facility. When choosing a hospice program, ask the following questions:  What services are available to me?  What services will be offered to my loved ones?  How involved will my loved ones be?  How involved will my health care provider be?  Who makes up the hospice care team? How are they trained or screened?  How will my pain and symptoms be managed?  If my circumstances change, can the services be provided in a different setting, such as my home or in the hospital?  Is the program reviewed and licensed by the state or certified in some other way?  Where can I learn more about hospice? You can learn about existing hospice programs in your area from your health care providers. You can also read more about hospice online. The websites of the following organizations contain helpful information:  The [facility] and Palliative Care Organization [facility]).  The Hospice Association of America ([place]).  The [place].  The American Cancer Society (ACS).  Hospice Net.  This information is not intended to replace advice given to you by your health care provider. Make sure you discuss any questions you have with your health care provider. Document Released: 08/28/2003 Document Revised: 12/26/2015 Document Reviewed: 03/21/2013 Elsevier Interactive Patient Education  2017 [employer].

## 2018-04-06 NOTE — Progress Notes (Signed)
Orthopedic Tech Progress Note Patient Details:  [name] 05-22-1941 [id number]  Ortho Devices Type of Ortho Device: Haematologist Ortho Device/Splint Location: bi-lateral Ortho Device/Splint Interventions: Ordered, Application, Removal, Adjustment   Post Interventions Patient Tolerated: Well Instructions Provided: Care of device, Adjustment of device   [name] 04/06/2018, 11:33 AM

## 2018-04-06 NOTE — Progress Notes (Signed)
[facility] ([facility])  Spoke with [facility] representative ref delivery of ordered DME.  Order was placed yesterday, but some computer situation (on [facility] or [facility]) prevented it from being processed until today at 11.  [facility] advised it was set to deliver today, but they could not provide a time frame during this conversation, they did advise they would contact [facility] ASAP.  Attempted to update [name], but VM was not set up to receive message, subsequently left VM with daughter Maretta Bees detailing above information.  Please call with any other issues or concerns, Venia Carbon BSN, RN [facility] Liaison (listed in [place]) [phone]

## 2018-04-06 NOTE — Progress Notes (Signed)
Reviewed d/c instructions with patient, wife and son, verbalized understanding and written instructions given. IV removed without problem.  Will be d/ced via [place] with family.

## 2018-04-06 NOTE — Progress Notes (Signed)
Dr. Aundra Dubin requested una boots be changed today before discharge. Ortho tech paged.

## 2018-04-06 NOTE — Telephone Encounter (Signed)
Patient was discharge from the hospital 11/13 with incorrect AVS. Called patient to clarify discrepancies and update medication list. Patient's wife, [name], answered and verified HIPAA identifiers.   Medication changes from AVS: - Increase torsemide morning dose to 80mg  and continue afternoon dose of 60mg   - Continue fenofibrate 54mg  daily - Stop pioglitazone   [name] voiced understanding. No questions at this time. Given [facility] number to follow up with any questions.  Isaias Sakai, [place] D PGY1 Pharmacy Resident  04/06/2018      4:30 PM

## 2018-04-06 NOTE — Discharge Summary (Signed)
Physician Discharge Summary  [name] NFA:[id number] DOB: [birth year] DOA: 03/18/2018  PCP: Lavone Orn, MD  Admit date: 03/18/2018 Discharge date: 04/06/2018  Admitted From: Home Disposition: Home with hospice  Recommendations for Outpatient Follow-up:  1. Follow up with PCP in 1-2 weeks 2. Please follow-up with your hospice provider for any pain or concerns  Home Health: Yes hospice Equipment/Devices: 2 L nasal cannula  Discharge Condition: Hospice CODE STATUS: Partial code Diet recommendation: Regular diet  Brief/Interim Summary:  #) Acute on chronic exacerbation of combined systolic and diastolic heart failure and severe right heart failure: Patient was admitted with A. fib with RVR and acute hypoxia.  It was felt the patient went into heart failure due to his known decreased EF.  His repeat echo showed EF of 30 to 35%.  Nephrology team was consulted for diuresis as patient had fairly significant CKD.  Patient was given IV diuresis with gradual improvement in creatinine and hypoxia and shortness of breath as well as lower extremity edema.  He did have a right heart catheterization that showed severely elevated pulmonary pressures with recommendation to continue IV diuresis.  Patient was discharged home on oral diuresis as well as isosorbide mononitrate and hydralazine to help manage his blood pressure.  #) Atrial flutter/atrial fibrillation with rapid ventricular response: Patient was admitted with RVR and atrial flutter and found to be exacerbating his heart failure.  He did receive a DC cardioversion on 03/22/2018.  Patient initially was on apixaban for prophylaxis however this was resumed on discharge.  Patient apparently has a history of occult GI bleed however did not have any evidence of GI bleed here.  #) UTI/bacteremia/sepsis/acute metabolic encephalopathy: Patient was noted to have progressive metabolic encephalopathy.  Blood cultures on 03/24/2018 showed ESBL  Klebsiella pneumonia.  Patient was started on IV meropenem and completed 2-week course that ended on 04/06/2018.  #) Stage III-IV CKD: Patient was admitted with mild AKI on stage III-IV CKD: This improved with diuresis.  Patient apparently was not considered a candidate for dialysis per nephrology.  #) Thyroidism: Patient was continued on levothyroxine.  #) Coronary artery disease status post CABG: Patient was not a candidate for any further invasive cardiac evaluation.  Discharge Diagnoses:  Active Problems:   Diabetes mellitus type 2 with complications (HCC)   S/P CABG (coronary artery bypass graft)   Essential hypertension   Chronic kidney disease, stage III (moderate) (HCC)   Volume overload   Benign essential HTN   New onset atrial fibrillation (HCC)   Hyponatremia   GI bleed   Symptomatic anemia   Hyperlipidemia   Combined systolic and diastolic heart failure (HCC)   Acute on chronic congestive heart failure (HCC)   AKI (acute kidney injury) ([place])   DNR (do not resuscitate) discussion   Palliative care by specialist   Transient alteration of awareness   Weakness generalized    Discharge Instructions  Discharge Instructions    Call MD for:  severe uncontrolled pain   Complete by:  As directed    Diet - low sodium heart healthy   Complete by:  As directed    Discharge instructions   Complete by:  As directed    Please contact your hospice provider with any shortness of breath, pain   Increase activity slowly   Complete by:  As directed      Allergies as of 04/06/2018      Reactions   Amoxicillin-pot Clavulanate Nausea Only   Has patient had a PCN  reaction causing immediate rash, facial/tongue/throat swelling, SOB or lightheadedness with hypotension: No Has patient had a PCN reaction causing severe rash involving mucus membranes or skin necrosis: No Has patient had a PCN reaction that required hospitalization: No Has patient had a PCN reaction occurring within  the last 10 years: No If all of the above answers are "NO", then may proceed with Cephalosporin use.   Morphine And Related Other (See Comments)   hallucinations   Percocet [oxycodone-acetaminophen] Other (See Comments)   Agitation and Increased Confusion. Family asks to please not order this medication for him.    Restoril [temazepam] Other (See Comments)   Confusion and Agitation. Has taken in past, but has not reacted well. Family requests to not give this mediation.       Medication List    STOP taking these medications   fenofibrate 54 MG tablet   metolazone 5 MG tablet Commonly known as:  ZAROXOLYN     TAKE these medications   acetaminophen 500 MG tablet Commonly known as:  TYLENOL Take 1,000 mg by mouth every 6 (six) hours as needed for headache (pain).   amiodarone 200 MG tablet Commonly known as:  PACERONE Take 1 tablet (200 mg total) by mouth 2 (two) times daily.   apixaban 5 MG Tabs tablet Commonly known as:  ELIQUIS Take 1 tablet (5 mg total) by mouth 2 (two) times daily. What changed:  when to take this   atorvastatin 20 MG tablet Commonly known as:  LIPITOR Take 20 mg by mouth daily after supper.   carvedilol 3.125 MG tablet Commonly known as:  COREG Take 1 tablet (3.125 mg total) by mouth 2 (two) times daily. What changed:    medication strength  how much to take   gabapentin 300 MG capsule Commonly known as:  NEURONTIN Take 300 mg by mouth 2 (two) times daily after a meal.   hydrALAZINE 25 MG tablet Commonly known as:  APRESOLINE Take 1 tablet (25 mg total) by mouth every 8 (eight) hours.   isosorbide mononitrate 30 MG 24 hr tablet Commonly known as:  IMDUR Take 1 tablet (30 mg total) by mouth daily. Start taking on:  04/07/2018   levothyroxine 100 MCG tablet Commonly known as:  SYNTHROID, LEVOTHROID Take 100 mcg by mouth daily before breakfast.   methylcellulose 1 % ophthalmic solution Commonly known as:  ARTIFICIAL TEARS Place 1 drop  into both eyes 2 (two) times daily as needed (dry eyes).   multivitamin with minerals Tabs tablet Take 1 tablet by mouth daily.   nitroGLYCERIN 0.4 MG SL tablet Commonly known as:  NITROSTAT PLACE 1 TABLET UNDER TONGUE EVERY 5 MINUTES AS NEEDED FOR CHEST PAIN. What changed:  See the new instructions.   pantoprazole 40 MG tablet Commonly known as:  PROTONIX Take 1 tablet (40 mg total) by mouth daily.   pioglitazone 30 MG tablet Commonly known as:  ACTOS Take 1 tablet (30 mg total) by mouth daily with lunch. What changed:  when to take this   potassium chloride SA 20 MEQ tablet Commonly known as:  K-DUR,KLOR-CON Take by mouth as directed. What changed:    how much to take  how to take this  when to take this  additional instructions   pseudoephedrine-guaifenesin 60-600 MG 12 hr tablet Commonly known as:  MUCINEX D Take 1 tablet by mouth every 12 (twelve) hours as needed for congestion.   sodium chloride 0.65 % Soln nasal spray Commonly known as:  OCEAN Place 1  spray into both nostrils 2 (two) times daily as needed for congestion.   tamsulosin 0.4 MG Caps capsule Commonly known as:  FLOMAX Take 0.4 mg by mouth every Monday, Wednesday, and Friday.   torsemide 20 MG tablet Commonly known as:  DEMADEX Take 4 tablets (80 mg total) by mouth daily with breakfast. Start taking on:  04/07/2018 What changed:    how much to take  when to take this   VICKS SINEX 0.05 % nasal spray Generic drug:  oxymetazoline Place 1 spray into both nostrils 2 (two) times daily as needed for congestion.   vitamin B-12 500 MCG tablet Commonly known as:  CYANOCOBALAMIN Take 500 mcg by mouth daily.   VITAMIN C PO Take 1 tablet by mouth 2 (two) times daily.   zinc gluconate 50 MG tablet Take 50 mg by mouth daily.            Durable Medical Equipment  (From admission, onward)         Start     Ordered   04/01/18 1413  For home use only DME standard manual wheelchair with  seat cushion  Once    Comments:  Patient suffers from advanced CHF and weakness which impair their ability to perform daily activities like transferring and mobilization around the home and outside of the home.  A walking aid will not resolve  issue with performing activities of daily living. A wheelchair will allow patient to safely perform daily activities. Patient can safely propel the wheelchair in the home or has a caregiver who can provide assistance.  Accessories: elevating leg rests (ELRs), wheel locks, extensions and anti-tippers.  Patient will need bariatric wheelchair   04/01/18 1416   04/01/18 1203  For home use only DME wheelchair cushion (seat and back)  Once     04/01/18 1202         Follow-up Information    Larey Dresser, MD. Go on 04/08/2018.   Specialty:  Cardiology Why:  9:40 AM, [facility], parking code [id number] information: [place] [id number] [place] Follow up.   Why:  Registered Nurse, Aide, Durable Medical Equipment: Hopsital Bed, Over-Bed Table and Bariatric Wheelchair.  Contact information: [place] [id number] [phone]         Allergies  Allergen Reactions  . Amoxicillin-Pot Clavulanate Nausea Only    Has patient had a PCN reaction causing immediate rash, facial/tongue/throat swelling, SOB or lightheadedness with hypotension: No Has patient had a PCN reaction causing severe rash involving mucus membranes or skin necrosis: No Has patient had a PCN reaction that required hospitalization: No Has patient had a PCN reaction occurring within the last 10 years: No If all of the above answers are "NO", then may proceed with Cephalosporin use.   Marland Kitchen Morphine And Related Other (See Comments)    hallucinations  . Percocet [Oxycodone-Acetaminophen] Other (See Comments)    Agitation and Increased Confusion. Family asks to please not  order this medication for him.   . Restoril [Temazepam] Other (See Comments)    Confusion and Agitation. Has taken in past, but has not reacted well. Family requests to not give this mediation.     Consultations:  Advanced heart failure  Nephrology  Cardiology   Procedures/Studies: Ct Abdomen Pelvis Wo Contrast  Result Date: 03/08/2018 CLINICAL DATA:  77 year old male with history of diffuse abdominal pain and  renal insufficiency. EXAM: CT ABDOMEN AND PELVIS WITHOUT CONTRAST TECHNIQUE: Multidetector CT imaging of the abdomen and pelvis was performed following the standard protocol without IV contrast. COMPARISON:  CT the abdomen and pelvis 09/22/2007. FINDINGS: Lower chest: Cardiomegaly. Mild calcifications of the aortic valve. Atherosclerotic calcifications in the descending thoracic aorta, as well as the left anterior descending, left circumflex and right coronary arteries. Trace bilateral pleural effusions lying dependently. Patchy areas of mild ground-glass attenuation and interlobular septal thickening in the visualize lung bases, suggesting a background of very mild interstitial pulmonary edema. Hepatobiliary: No definite cystic or solid hepatic lesions are confidently identified on today's noncontrast CT examination. Gallbladder is only moderately distended. There is a small amount of haziness adjacent to the gallbladder, which may reflect some edema in the gallbladder wall. No overt surrounding inflammatory changes. No definite calcified gallstones are identified. Pancreas: Adjacent to the tail of the pancreas there are subtle inflammatory changes. Extending cephalad from the tail of the pancreas there is a 4.1 x 2.9 x 3.7 cm centrally low-attenuation lesion (axial image 23 of series 3 and coronal image 65 of series 6) with some peripheral soft tissue thickening, intimately associated with the proximal stomach, not characterized on today's noncontrast CT examination, but favored to  represent a pancreatic pseudocyst. Spleen: Unremarkable. Adrenals/Urinary Tract: Mild atrophy of both kidneys. In the upper pole of the right kidney there is a 1.5 cm high attenuation lesion (axial image 27 of series 3) which is incompletely characterized on today's noncontrast CT examination. Likewise there is a 9 mm high attenuation lesion in the lower pole the left kidney which is also not characterized. Extrarenal pelvis on the left (normal anatomical variant) again noted. No hydroureteronephrosis. Urinary bladder is normal in appearance. Bilateral adrenal glands are normal in appearance. Stomach/Bowel: As mentioned above, the lesion in the tail of the pancreas is intimately associated with the proximal stomach along the under surface and greater curvature. Distal aspect of the stomach is otherwise unremarkable. No pathologic dilatation of small bowel or colon. Normal appendix. Vascular/Lymphatic: Aortic atherosclerosis. Postoperative changes of graft replacement of the common and external iliac arteries bilaterally. No lymphadenopathy noted in the abdomen or pelvis. Reproductive: Prostate gland and seminal vesicles are unremarkable in appearance. Other: Trace volume of perihepatic ascites.  No pneumoperitoneum. Musculoskeletal: There are no aggressive appearing lytic or blastic lesions noted in the visualized portions of the skeleton. IMPRESSION: 1. Subtle inflammatory changes adjacent to the tail of the pancreas where there is also a lesion extending cephalad in contact with the proximal stomach. These findings likely reflect pancreatitis with a small pancreatic pseudocyst, however, further characterization with MRI of the abdomen with and without IV gadolinium with MRCP is strongly recommended in the near future to exclude the possibility of pancreatic neoplasm. 2. Subtle haziness adjacent to the gallbladder favored to reflect some gallbladder wall edema. No calcified cholelithiasis. Gallbladder is not  distended, and there are no overt surrounding inflammatory changes to strongly suggest an acute cholecystitis at this time. Attention at time of forthcoming MRI examination is recommended. 3. Trace volume of perihepatic ascites. 4. Two small high attenuation lesions in the left kidney. These are favored to represent small proteinaceous/hemorrhagic cysts, but are incompletely characterized on today's noncontrast CT examination. Attention at time of forthcoming MRI of the abdomen is recommended for definitive characterization. 5. There are calcifications of the aortic valve. Echocardiographic correlation for evaluation of potential valvular dysfunction may be warranted if clinically indicated. 6. Aortic atherosclerosis, in addition to at  least 3 vessel coronary artery disease. Assessment for potential risk factor modification, dietary therapy or pharmacologic therapy may be warranted, if clinically indicated. 7. There are calcifications of the aortic valve. Echocardiographic correlation for evaluation of potential valvular dysfunction may be warranted if clinically indicated. 8. Cardiomegaly with evidence of mild interstitial pulmonary edema in the visualize lung bases, and trace bilateral pleural effusions; findings suggestive of congestive heart failure. Electronically Signed   By: Vinnie Langton M.D.   On: 03/08/2018 11:21   Dg Chest 2 View  Result Date: 03/18/2018 CLINICAL DATA:  77 year old male with a history of generalized weakness for 2 days EXAM: CHEST - 2 VIEW COMPARISON:  02/28/2018 FINDINGS: Cardiomediastinal silhouette unchanged with cardiomegaly and atherosclerosis of the aorta. Surgical changes of median sternotomy. Interlobular septal thickening with fullness of the vasculature. Blunting at the costophrenic angle and cardiophrenic angle. Minimal meniscus on the lateral view. No displaced fracture. IMPRESSION: Early CHF with small right pleural effusion. Surgical changes of median sternotomy  Electronically Signed   By: Corrie Mckusick D.O.   On: 03/18/2018 13:40   Mr Abdomen Mrcp Moise Boring Contast  Result Date: 03/09/2018 CLINICAL DATA:  Abdominal pain, dark stools and pancreatitis. History of diabetes, hypertension and renal insufficiency. EXAM: MRI ABDOMEN WITHOUT AND WITH CONTRAST (INCLUDING MRCP) TECHNIQUE: Multiplanar multisequence MR imaging of the abdomen was performed both before and after the administration of intravenous contrast. Heavily T2-weighted images of the biliary and pancreatic ducts were obtained, and three-dimensional MRCP images were rendered by post processing. CONTRAST:  10 cc Gadavist COMPARISON:  Abdominopelvic CT 03/08/2018. FINDINGS: Despite efforts by the technologist and patient, mild motion artifact is present on today's exam and could not be eliminated. This reduces exam sensitivity and specificity. The thin section MRCP images are essentially nondiagnostic. Lower chest: Small dependent bilateral pleural effusions. The heart is enlarged status post median sternotomy. Hepatobiliary: The liver demonstrates mild periportal edema, but no significant steatosis, focal lesion or abnormal enhancement. There is some pericholecystic fluid, but no gallbladder wall thickening, abnormal enhancement or biliary dilatation. As above, the thin section dedicated MRCP images are limited. Pancreas: As demonstrated on recent CT, there is a cystic mass associated with the pancreatic tail. This measures 3.2 x 2.7 x 3.1 cm and demonstrates central high T2 signal and mild thin peripheral enhancement. This lesion is contiguous with both the pancreatic tail and the proximal stomach on the coronal images. The remainder of the pancreas appears normal. There is no pancreatic ductal dilatation or other surrounding fluid collection. Spleen: Normal in size without focal abnormality. Adrenals/Urinary Tract: Both adrenal glands appear normal. Both kidneys demonstrate cortical thinning and small cysts. 13 mm  lesion in the upper pole of the left kidney demonstrates low T2 signal, but no enhancement following contrast, consistent with a hemorrhagic cyst. Unfortunately, the 14 mm exophytic lesion involving the lower pole of the left kidney is not fully imaged in the axial plane and is incompletely characterized. This lesion also demonstrates low T2 signal, although has higher signal on the postcontrast coronal images (image 26/28). This signal is indeterminate for enhancement as the precontrast images do not encompass this lesion. Stomach/Bowel: No evidence of bowel wall thickening, distention or surrounding inflammatory change.As above, the cystic mass involving the pancreatic tail abuts the proximal stomach. Vascular/Lymphatic: There are no enlarged abdominal lymph nodes. Extensive aortic and branch vessel atherosclerosis, better demonstrated on CT. Patient is status post aortobifemoral grafting. Other: Mild anasarca with mild diffuse soft tissue edema and a small amount  of ascites. The edema appears greatest around the pancreas and gallbladder. Musculoskeletal: No acute or significant osseous findings. Schmorl's nodes are noted in the spine. IMPRESSION: 1. Study is moderately motion degraded, especially on the thin section MRCP images. No biliary or pancreatic ductal dilatation is demonstrated. 2. Previously demonstrated lesion involving the pancreatic tail remains indeterminate, largely cystic in nature, but with thin peripheral enhancement. This could reflect a postinflammatory pseudocyst, although given normal recent serum lipase levels, cystic pancreatic neoplasm or necrotic gastrointestinal stromal tumor of the stomach remain potential explanations. Follow-up CT or MRI in 3 months recommended. 3. Previously described left renal lesions are likely hemorrhagic cysts, although the lower pole lesion is incompletely visualized on the axial images. Attention on follow-up recommended. 4. Anasarca with generalized edema,  ascites and bilateral pleural effusions. There is pericholecystic fluid without gallbladder wall thickening or abnormal enhancement. Electronically Signed   By: Richardean Sale M.D.   On: 03/09/2018 14:41   03/22/2018 TEE/DC cardioversion:  - Left ventricle: The cavity size was dilated. Wall thickness was   normal. Systolic function was moderately to severely reduced. The   estimated ejection fraction was in the range of 30% to 35%.   Diffuse hypokinesis. No evidence of thrombus. - Aortic valve: No evidence of vegetation. - Mitral valve: Mildly calcified annulus. Mildly thickened leaflets   . No evidence of vegetation. There was mild regurgitation. - Left atrium: The atrium was dilated. No evidence of thrombus in   the atrial cavity or appendage. No evidence of thrombus in the   atrial cavity or appendage. No evidence of thrombus in the   appendage. - Right ventricle: The cavity size was dilated. Systolic function   was moderately reduced. - Right atrium: The atrium was dilated. No evidence of thrombus in   the atrial cavity or appendage. - Atrial septum: No defect or patent foramen ovale was identified.   Echo contrast study showed no right-to-left atrial level shunt,   following an increase in RA pressure induced by provocative   maneuvers. - Tricuspid valve: No evidence of vegetation. - Pulmonic valve: No evidence of vegetation. - Superior vena cava: The study excluded a thrombus.  Impressions:  - Successful cardioversion. No cardiac source of emboli was   indentified.   03/30/2018 echo with contrast: - Left ventricle: The cavity size was mildly to moderately dilated.   Wall thickness was normal. Systolic function was severely   reduced. The estimated ejection fraction was in the range of 25%   to 30%. Diffuse hypokinesis. Indeterminant diastolic function. - Aortic valve: Trileaflet; moderately calcified leaflets.   Sclerosis without stenosis. - Mitral valve: Mildly  calcified annulus. There was mild   regurgitation. - Left atrium: The atrium was moderately dilated. - Right ventricle: The cavity size was mildly dilated. Systolic   function was moderately reduced. - Right atrium: The atrium was moderately dilated. - Tricuspid valve: Peak RV-RA gradient (S): 39 mm Hg. - Systemic veins: IVC was not visualized.  Impressions:  - Technically difficult study with poor acoustic windows. Mild to   moderate LV dilation with severely decreased systolic function,   EF 93-81%. Diffuse hypokinesis. Mildly dilated RV with mild to   moderately decreased systolic function. Aortic valve sclerosis   without significant stenosis. Mild mitral regurgitation. Mild   pulmonary hypertension.  04/04/2018 right heart cath:1. Preserved cardiac output.  2. Severe pulmonary venous hypertension.  3. Right and left heart filling pressures remain elevated.   Would resume IV diuresis.  Subjective:   Discharge Exam: Vitals:   04/06/18 0500 04/06/18 0758  BP: (!) 146/59 (!) 150/57  Pulse:  (!) 59  Resp: 14 15  Temp: 97.8 F (36.6 C) 97.8 F (36.6 C)  SpO2: 99%    Vitals:   04/05/18 2020 04/05/18 2300 04/06/18 0500 04/06/18 0758  BP: (!) 138/52 (!) 144/54 (!) 146/59 (!) 150/57  Pulse: (!) 50 (!) 59  (!) 59  Resp: 15 (!) 25 14 15   Temp: 97.6 F (36.4 C) 98.1 F (36.7 C) 97.8 F (36.6 C) 97.8 F (36.6 C)  TempSrc: Oral Oral  Oral  SpO2: 98% 100% 99%   Weight:   108.6 kg     General: Pt is alert, awake, not in acute distress Cardiovascular: Bradycardic, no murmurs Respiratory: CTA bilaterally, diminished lung sounds at bases, no wheezes Abdominal: Soft, NT, ND, bowel sounds + Extremities: Bilateral lower extremities wrapped    The results of significant diagnostics from this hospitalization (including imaging, microbiology, ancillary and laboratory) are listed below for reference.     Microbiology: No results found for this or any previous visit  (from the past 240 hour(s)).   Labs: BNP (last 3 results) Recent Labs    02/25/18 1340 03/18/18 1325 03/24/18 1019  BNP 1,809.7* 2,440.0* 0,017.4*   Basic Metabolic Panel: Recent Labs  Lab 04/01/18 0250 04/02/18 0636 04/03/18 0427 04/04/18 0616 04/05/18 0421 04/06/18 0423  NA 140 138 137 136 136 136  K 3.5 4.5 3.7 3.4* 3.8 3.4*  CL 99 98 96* 94* 95* 96*  CO2 33* 35* 33* 32 33* 33*  GLUCOSE 89 106* 114* 119* 133* 111*  BUN 52* 47* 44* 44* 43* 48*  CREATININE 1.66* 1.75* 1.96* 1.77* 2.03* 1.91*  CALCIUM 9.2 9.1 9.0 9.2 9.3 9.3  MG 2.1  --   --   --   --   --   PHOS 2.3* 2.5 2.5 2.5 3.1 3.1   Liver Function Tests: Recent Labs  Lab 04/02/18 0636 04/03/18 0427 04/04/18 0616 04/05/18 0421 04/06/18 0423  ALBUMIN 2.9* 2.7* 2.9* 2.7* 2.5*   No results for input(s): LIPASE, AMYLASE in the last 168 hours. No results for input(s): AMMONIA in the last 168 hours. CBC: Recent Labs  Lab 04/01/18 0250 04/02/18 0636 04/03/18 0427 04/04/18 0616 04/05/18 0421 04/06/18 0423  WBC 7.9 6.6 6.6 6.5 5.6 5.0  NEUTROABS 6.2  --   --   --   --   --   HGB 9.5* 9.2* 8.9* 9.2* 9.2* 8.2*  HCT 30.8* 31.0* 28.4* 29.7* 30.1* 27.4*  MCV 89.0 90.4 87.9 87.9 88.3 87.5  PLT 253 255 222 224 203 200   Cardiac Enzymes: No results for input(s): CKTOTAL, CKMB, CKMBINDEX, TROPONINI in the last 168 hours. BNP: Invalid input(s): POCBNP CBG: Recent Labs  Lab 04/05/18 0741 04/05/18 1200 04/05/18 1642 04/05/18 2119 04/06/18 0745  GLUCAP 144* 133* 122* 140* 102*   D-Dimer No results for input(s): DDIMER in the last 72 hours. Hgb A1c No results for input(s): HGBA1C in the last 72 hours. Lipid Profile No results for input(s): CHOL, HDL, LDLCALC, TRIG, CHOLHDL, LDLDIRECT in the last 72 hours. Thyroid function studies No results for input(s): TSH, T4TOTAL, T3FREE, THYROIDAB in the last 72 hours.  Invalid input(s): FREET3 Anemia work up No results for input(s): VITAMINB12, FOLATE,  FERRITIN, TIBC, IRON, RETICCTPCT in the last 72 hours. Urinalysis    Component Value Date/Time   COLORURINE YELLOW 03/20/2018 0725   APPEARANCEUR CLOUDY (A) 03/20/2018 0725  LABSPEC 1.008 03/20/2018 0725   PHURINE 6.0 03/20/2018 0725   GLUCOSEU NEGATIVE 03/20/2018 0725   HGBUR LARGE (A) 03/20/2018 0725   BILIRUBINUR NEGATIVE 03/20/2018 0725   KETONESUR NEGATIVE 03/20/2018 0725   PROTEINUR 30 (A) 03/20/2018 0725   UROBILINOGEN 4.0 (H) 09/23/2011 1335   NITRITE NEGATIVE 03/20/2018 0725   LEUKOCYTESUR LARGE (A) 03/20/2018 0725   Sepsis Labs Invalid input(s): PROCALCITONIN,  WBC,  LACTICIDVEN Microbiology No results found for this or any previous visit (from the past 240 hour(s)).   Time coordinating discharge: 35  SIGNED:   Cristy Folks, MD  [facility] 04/06/2018, 11:49 AM  If 7PM-7AM, please contact night-coverage [email or web address] Password [email or web address]

## 2018-04-06 NOTE — Telephone Encounter (Signed)
I spoke with patient and his wife regarding his hospital follow-up no scheduled for Nov. 20 at 12:00 noon.  They confirm and can come to this appt.

## 2018-04-06 NOTE — Progress Notes (Signed)
Patient ID: [name], male   DOB: [birth year], 77 y.o.   MRN: [id number]     Advanced Heart Failure Rounding Note  PCP-Cardiologist: Sinclair Grooms, MD   Subjective:    Patient does not appear to have diuresed well yesterday but feels ok, no dyspnea at rest or walking short distances around room.  Creatinine stable at 1.9.  Very eager to go home.   RHC Procedural Findings: Hemodynamics (mmHg) RA mean 12 RV 70/16 PA 71/25, mean 41 PCWP mean 26 with prominent v-waves to 43 mmHg Oxygen saturations: PA 67% AO 98% Cardiac Output (Fick) 7.91  Cardiac Index (Fick) 3.43 PVR 1.9 WU    Objective:   Weight Range: 108.6 kg Body mass index is 33.39 kg/m.   Vital Signs:   Temp:  [97.6 F (36.4 C)-98.1 F (36.7 C)] 97.8 F (36.6 C) (11/13 0758) Pulse Rate:  [50-59] 59 (11/13 0758) Resp:  [14-25] 15 (11/13 0758) BP: (138-150)/(52-59) 150/57 (11/13 0758) SpO2:  [98 %-100 %] 99 % (11/13 0500) Weight:  [108.6 kg] 108.6 kg (11/13 0500) Last BM Date: 04/06/18  Weight change: Filed Weights   04/05/18 0035 04/05/18 0631 04/06/18 0500  Weight: 108.4 kg 108.4 kg 108.6 kg    Intake/Output:   Intake/Output Summary (Last 24 hours) at 04/06/2018 1019 Last data filed at 04/06/2018 0900 Gross per 24 hour  Intake 1000 ml  Output 300 ml  Net 700 ml    Physical Exam   General: NAD Neck: JVP not elevated, no thyromegaly or thyroid nodule.  Lungs: Clear to auscultation bilaterally with normal respiratory effort. CV: Nondisplaced PMI.  Heart regular S1/S2, no S3/S4, no murmur.  1+ edema to knees, in Unna boots.   Abdomen: Soft, nontender, no hepatosplenomegaly, no distention.  Skin: Intact without lesions or rashes.  Neurologic: Alert and oriented x 3.  Psych: Normal affect. Extremities: No clubbing or cyanosis.  HEENT: Normal.    Telemetry   NSR 50s, personally reviewed.   EKG    No new tracings.    Labs    CBC Recent Labs    04/05/18 0421 04/06/18 0423  WBC  5.6 5.0  HGB 9.2* 8.2*  HCT 30.1* 27.4*  MCV 88.3 87.5  PLT 203 921   Basic Metabolic Panel Recent Labs    04/05/18 0421 04/06/18 0423  NA 136 136  K 3.8 3.4*  CL 95* 96*  CO2 33* 33*  GLUCOSE 133* 111*  BUN 43* 48*  CREATININE 2.03* 1.91*  CALCIUM 9.3 9.3  PHOS 3.1 3.1   Liver Function Tests Recent Labs    04/05/18 0421 04/06/18 0423  ALBUMIN 2.7* 2.5*   No results for input(s): LIPASE, AMYLASE in the last 72 hours. Cardiac Enzymes No results for input(s): CKTOTAL, CKMB, CKMBINDEX, TROPONINI in the last 72 hours.  BNP: BNP (last 3 results) Recent Labs    02/25/18 1340 03/18/18 1325 03/24/18 1019  BNP 1,809.7* 2,440.0* 1,916.1*    ProBNP (last 3 results) Recent Labs    02/03/18 1217 02/24/18 1243  PROBNP 10,666* 17,251*     D-Dimer No results for input(s): DDIMER in the last 72 hours. Hemoglobin A1C No results for input(s): HGBA1C in the last 72 hours. Fasting Lipid Panel No results for input(s): CHOL, HDL, LDLCALC, TRIG, CHOLHDL, LDLDIRECT in the last 72 hours. Thyroid Function Tests No results for input(s): TSH, T4TOTAL, T3FREE, THYROIDAB in the last 72 hours.  Invalid input(s): FREET3  Other results:   Imaging    No results  found.   Medications:     Scheduled Medications: . amiodarone  200 mg Oral Daily  . apixaban  5 mg Oral BID  . atorvastatin  20 mg Oral QPC supper  . carvedilol  3.125 mg Oral BID  . fenofibrate  54 mg Oral Daily  . furosemide  80 mg Intravenous BID  . gabapentin  100 mg Oral BID PC  . hydrALAZINE  25 mg Oral Q8H  . insulin aspart  0-20 Units Subcutaneous TID WC  . insulin aspart  0-5 Units Subcutaneous QHS  . insulin glargine  8 Units Subcutaneous QHS  . isosorbide mononitrate  30 mg Oral Daily  . levothyroxine  100 mcg Oral QAC breakfast  . pantoprazole  40 mg Oral Daily  . sodium chloride flush  3 mL Intravenous Q12H  . tamsulosin  0.4 mg Oral Q M,W,F    Infusions: . sodium chloride    . meropenem  (MERREM) IV 1 g (04/06/18 0456)    PRN Medications: sodium chloride, acetaminophen, calcium carbonate, camphor-menthol, hydrocortisone cream, nitroGLYCERIN, ondansetron (ZOFRAN) IV, sodium chloride flush  Patient Profile   [name] is a 77 year old with a history of chronic combined systolic/diastolic heart failure, PAF, GI bleed, PAD, memory impairment, CAD, CABG, hypterlipidemia, CKD, DMII.   Admitted with increased fatigue and dyspnea.   Assessment/Plan   1. Acute on chronic systolic CHF: Ischemic cardiomyopathy, TEE this admission with EF 30-35%, moderately decreased systolic function.   TTE with EF 25-30%.  Earlier in hospital stay, he had AKI and tentative plan had been for hospice/comfort care.  However, renal function has gradually improved and he is doing better though still with NYHA class III symptoms. He is not a candidate for advanced therapies with 77 and renal dysfunction.  Creatinine stable at 1.9.  RHC 11/11 with ongoing volume overload but preserved cardiac output. He did not diurese particularly well yesterday but feels good today and is not markedly volume overloaded on exam.  He wants to go hom.  - Stop IV lasix, start torsemide 80 qam/60 qpm (was on 60 bid prior to admission).     - Continue Coreg 3.125 mg bid.  - Continue hydralazine to 25 mg tid and Imdur 30 daily for afterload reduction.  - No ACEi/ARNI/ARB/spironolactone/digoxin with AKI this admission and baseline CKD.  - Continue Unna boots, need to change today prior to discharge.  - Cardiomems would be a consideration for him after discharge depending on trajectory.  If he proceeds with hospice, would not pursue this.   2. AKI on CKD stage 3-4: Earlier in hospitalization, he was uremic with BUN in 120s and creatinine > 3, but creatinine now down to 1.9.    - Follow creatinine closely as outpatient.  3. Atrial fibrillation with RVR: Now in NSR s/p TEE-guided DCCV.  He is on amiodarone to maintain NSR and  Eliquis.  - Continue Eliquis.  - Continue amiodarone 200 mg daily.   4. CAD: S/p CABG in 2013.  No chest pain. He is on atorvastatin.  5. ID: Klebsiella bacteremia, ?source.  CXR did not show definite PNA.  He is on meropenem, per primary team. 6. PAD: Extensive history.  Has had aorto-bifemoral bypass, left common femoral endarterectomy with fem-pop bypass.  7. Disposition: Patient will go home with hospice care, today is reasonable from my standpoint.  We will follow him closely to try to control his volume, he has appt scheduled.  Cardiac medications for discharge: torsemide 80 qam/60 qpm, KCl  20 daily, Coreg 3.125 mg bid, hydralazine 25 mg tid, Imdur 30 daily, amiodarone 200 mg daily, apixaban 5 mg bid, atorvastatin 20 daily, fenofibrate.  Make sure that he does not restart pioglitazone.   Loralie Champagne 04/06/2018 10:19 AM

## 2018-04-07 ENCOUNTER — Ambulatory Visit: Payer: Medicare HMO | Admitting: Cardiology

## 2018-04-08 ENCOUNTER — Encounter (HOSPITAL_COMMUNITY): Payer: Medicare HMO | Admitting: Cardiology

## 2018-04-13 ENCOUNTER — Inpatient Hospital Stay (HOSPITAL_COMMUNITY): Admission: RE | Admit: 2018-04-13 | Payer: Medicare HMO | Source: Ambulatory Visit

## 2018-04-14 ENCOUNTER — Ambulatory Visit: Payer: Medicare HMO | Admitting: Vascular Surgery

## 2018-04-14 ENCOUNTER — Inpatient Hospital Stay (HOSPITAL_COMMUNITY): Payer: Medicare HMO

## 2018-04-15 DIAGNOSIS — K921 Melena: Secondary | ICD-10-CM | POA: Diagnosis not present

## 2018-04-15 DIAGNOSIS — E039 Hypothyroidism, unspecified: Secondary | ICD-10-CM | POA: Diagnosis not present

## 2018-04-15 DIAGNOSIS — I5040 Unspecified combined systolic (congestive) and diastolic (congestive) heart failure: Secondary | ICD-10-CM | POA: Diagnosis not present

## 2018-04-15 DIAGNOSIS — I272 Pulmonary hypertension, unspecified: Secondary | ICD-10-CM | POA: Diagnosis not present

## 2018-04-15 DIAGNOSIS — I480 Paroxysmal atrial fibrillation: Secondary | ICD-10-CM | POA: Diagnosis not present

## 2018-04-16 ENCOUNTER — Other Ambulatory Visit: Payer: Self-pay

## 2018-04-16 ENCOUNTER — Observation Stay (HOSPITAL_COMMUNITY)
Admission: EM | Admit: 2018-04-16 | Discharge: 2018-04-17 | Disposition: A | Discharge status: Home / Self Care | Attending: Internal Medicine | Admitting: Internal Medicine

## 2018-04-16 ENCOUNTER — Encounter (HOSPITAL_COMMUNITY): Payer: Self-pay | Admitting: Emergency Medicine

## 2018-04-16 DIAGNOSIS — I700 Atherosclerosis of aorta: Secondary | ICD-10-CM | POA: Diagnosis not present

## 2018-04-16 DIAGNOSIS — E039 Hypothyroidism, unspecified: Secondary | ICD-10-CM | POA: Diagnosis not present

## 2018-04-16 DIAGNOSIS — E871 Hypo-osmolality and hyponatremia: Secondary | ICD-10-CM | POA: Diagnosis not present

## 2018-04-16 DIAGNOSIS — Z951 Presence of aortocoronary bypass graft: Secondary | ICD-10-CM | POA: Insufficient documentation

## 2018-04-16 DIAGNOSIS — I2510 Atherosclerotic heart disease of native coronary artery without angina pectoris: Secondary | ICD-10-CM | POA: Diagnosis not present

## 2018-04-16 DIAGNOSIS — I5043 Acute on chronic combined systolic (congestive) and diastolic (congestive) heart failure: Secondary | ICD-10-CM | POA: Diagnosis not present

## 2018-04-16 DIAGNOSIS — I130 Hypertensive heart and chronic kidney disease with heart failure and stage 1 through stage 4 chronic kidney disease, or unspecified chronic kidney disease: Secondary | ICD-10-CM | POA: Insufficient documentation

## 2018-04-16 DIAGNOSIS — Z87442 Personal history of urinary calculi: Secondary | ICD-10-CM | POA: Diagnosis not present

## 2018-04-16 DIAGNOSIS — I5040 Unspecified combined systolic (congestive) and diastolic (congestive) heart failure: Secondary | ICD-10-CM | POA: Diagnosis present

## 2018-04-16 DIAGNOSIS — E1122 Type 2 diabetes mellitus with diabetic chronic kidney disease: Secondary | ICD-10-CM | POA: Insufficient documentation

## 2018-04-16 DIAGNOSIS — I5084 End stage heart failure: Secondary | ICD-10-CM | POA: Insufficient documentation

## 2018-04-16 DIAGNOSIS — I509 Heart failure, unspecified: Secondary | ICD-10-CM

## 2018-04-16 DIAGNOSIS — R195 Other fecal abnormalities: Secondary | ICD-10-CM | POA: Diagnosis not present

## 2018-04-16 DIAGNOSIS — E876 Hypokalemia: Secondary | ICD-10-CM | POA: Insufficient documentation

## 2018-04-16 DIAGNOSIS — N183 Chronic kidney disease, stage 3 unspecified: Secondary | ICD-10-CM | POA: Diagnosis present

## 2018-04-16 DIAGNOSIS — G8929 Other chronic pain: Secondary | ICD-10-CM | POA: Diagnosis present

## 2018-04-16 DIAGNOSIS — Z880 Allergy status to penicillin: Secondary | ICD-10-CM | POA: Diagnosis not present

## 2018-04-16 DIAGNOSIS — D6489 Other specified anemias: Secondary | ICD-10-CM

## 2018-04-16 DIAGNOSIS — M549 Dorsalgia, unspecified: Secondary | ICD-10-CM

## 2018-04-16 DIAGNOSIS — I4891 Unspecified atrial fibrillation: Secondary | ICD-10-CM | POA: Insufficient documentation

## 2018-04-16 DIAGNOSIS — D62 Acute posthemorrhagic anemia: Secondary | ICD-10-CM | POA: Insufficient documentation

## 2018-04-16 DIAGNOSIS — Z87891 Personal history of nicotine dependence: Secondary | ICD-10-CM | POA: Diagnosis not present

## 2018-04-16 DIAGNOSIS — E785 Hyperlipidemia, unspecified: Secondary | ICD-10-CM | POA: Insufficient documentation

## 2018-04-16 DIAGNOSIS — E440 Moderate protein-calorie malnutrition: Secondary | ICD-10-CM | POA: Insufficient documentation

## 2018-04-16 DIAGNOSIS — Z885 Allergy status to narcotic agent status: Secondary | ICD-10-CM | POA: Insufficient documentation

## 2018-04-16 DIAGNOSIS — Z7901 Long term (current) use of anticoagulants: Secondary | ICD-10-CM | POA: Diagnosis not present

## 2018-04-16 DIAGNOSIS — Z79899 Other long term (current) drug therapy: Secondary | ICD-10-CM | POA: Diagnosis not present

## 2018-04-16 DIAGNOSIS — N179 Acute kidney failure, unspecified: Secondary | ICD-10-CM | POA: Diagnosis not present

## 2018-04-16 DIAGNOSIS — E118 Type 2 diabetes mellitus with unspecified complications: Secondary | ICD-10-CM | POA: Diagnosis present

## 2018-04-16 DIAGNOSIS — D649 Anemia, unspecified: Secondary | ICD-10-CM | POA: Diagnosis not present

## 2018-04-16 DIAGNOSIS — R0602 Shortness of breath: Secondary | ICD-10-CM

## 2018-04-16 DIAGNOSIS — K922 Gastrointestinal hemorrhage, unspecified: Secondary | ICD-10-CM | POA: Diagnosis not present

## 2018-04-16 DIAGNOSIS — I10 Essential (primary) hypertension: Secondary | ICD-10-CM | POA: Diagnosis present

## 2018-04-16 DIAGNOSIS — N184 Chronic kidney disease, stage 4 (severe): Secondary | ICD-10-CM | POA: Insufficient documentation

## 2018-04-16 DIAGNOSIS — I5020 Unspecified systolic (congestive) heart failure: Secondary | ICD-10-CM | POA: Diagnosis not present

## 2018-04-16 LAB — HEMOGLOBIN AND HEMATOCRIT, BLOOD
HCT: 26.3 % — ABNORMAL LOW (ref 39.0–52.0)
HEMOGLOBIN: 8.2 g/dL — AB (ref 13.0–17.0)

## 2018-04-16 LAB — CBC WITH DIFFERENTIAL/PLATELET
ABS IMMATURE GRANULOCYTES: 0.02 10*3/uL (ref 0.00–0.07)
BASOS ABS: 0 10*3/uL (ref 0.0–0.1)
Basophils Relative: 1 %
Eosinophils Absolute: 0.3 10*3/uL (ref 0.0–0.5)
Eosinophils Relative: 6 %
HEMATOCRIT: 21.9 % — AB (ref 39.0–52.0)
HEMOGLOBIN: 6.5 g/dL — AB (ref 13.0–17.0)
Immature Granulocytes: 0 %
LYMPHS ABS: 0.8 10*3/uL (ref 0.7–4.0)
LYMPHS PCT: 16 %
MCH: 26.9 pg (ref 26.0–34.0)
MCHC: 29.7 g/dL — AB (ref 30.0–36.0)
MCV: 90.5 fL (ref 80.0–100.0)
MONO ABS: 0.6 10*3/uL (ref 0.1–1.0)
Monocytes Relative: 12 %
NEUTROS ABS: 3.1 10*3/uL (ref 1.7–7.7)
NRBC: 0 % (ref 0.0–0.2)
Neutrophils Relative %: 65 %
Platelets: 299 10*3/uL (ref 150–400)
RBC: 2.42 MIL/uL — ABNORMAL LOW (ref 4.22–5.81)
RDW: 17.7 % — ABNORMAL HIGH (ref 11.5–15.5)
WBC: 4.7 10*3/uL (ref 4.0–10.5)

## 2018-04-16 LAB — COMPREHENSIVE METABOLIC PANEL
ALT: 22 U/L (ref 0–44)
ANION GAP: 10 (ref 5–15)
AST: 32 U/L (ref 15–41)
Albumin: 2.9 g/dL — ABNORMAL LOW (ref 3.5–5.0)
Alkaline Phosphatase: 47 U/L (ref 38–126)
BILIRUBIN TOTAL: 0.8 mg/dL (ref 0.3–1.2)
BUN: 48 mg/dL — AB (ref 8–23)
CHLORIDE: 97 mmol/L — AB (ref 98–111)
CO2: 28 mmol/L (ref 22–32)
CREATININE: 2.12 mg/dL — AB (ref 0.61–1.24)
Calcium: 8.7 mg/dL — ABNORMAL LOW (ref 8.9–10.3)
GFR, EST AFRICAN AMERICAN: 33 mL/min — AB (ref >60)
GFR, EST NON AFRICAN AMERICAN: 28 mL/min — AB (ref >60)
Glucose, Bld: 194 mg/dL — ABNORMAL HIGH (ref 70–99)
Potassium: 3.3 mmol/L — ABNORMAL LOW (ref 3.5–5.1)
Sodium: 135 mmol/L (ref 135–145)
Total Protein: 6.3 g/dL — ABNORMAL LOW (ref 6.5–8.1)

## 2018-04-16 LAB — POC OCCULT BLOOD, ED: FECAL OCCULT BLD: POSITIVE — AB

## 2018-04-16 LAB — PREPARE RBC (CROSSMATCH)

## 2018-04-16 MED ORDER — TAMSULOSIN HCL 0.4 MG PO CAPS: 0.4000 mg | ORAL_CAPSULE | ORAL | Status: DC

## 2018-04-16 MED ORDER — FENOFIBRATE 54 MG PO TABS
54.0000 mg | ORAL_TABLET | Freq: Every day | ORAL | Status: DC
  Administered 2018-04-17: 54 mg via ORAL
  Filled 2018-04-16: qty 1

## 2018-04-16 MED ORDER — TORSEMIDE 20 MG PO TABS
80.0000 mg | ORAL_TABLET | Freq: Every day | ORAL | Status: DC
  Administered 2018-04-17: 80 mg via ORAL
  Filled 2018-04-16: qty 4

## 2018-04-16 MED ORDER — HYDRALAZINE HCL 25 MG PO TABS
25.0000 mg | ORAL_TABLET | Freq: Three times a day (TID) | ORAL | Status: DC
  Administered 2018-04-16 – 2018-04-17 (×3): 25 mg via ORAL
  Filled 2018-04-16 (×3): qty 1

## 2018-04-16 MED ORDER — AMIODARONE HCL 200 MG PO TABS
200.0000 mg | ORAL_TABLET | Freq: Two times a day (BID) | ORAL | Status: DC
  Administered 2018-04-16 – 2018-04-17 (×2): 200 mg via ORAL
  Filled 2018-04-16 (×2): qty 1

## 2018-04-16 MED ORDER — CARVEDILOL 3.125 MG PO TABS
3.1250 mg | ORAL_TABLET | Freq: Two times a day (BID) | ORAL | Status: DC
  Administered 2018-04-16 – 2018-04-17 (×2): 3.125 mg via ORAL
  Filled 2018-04-16 (×2): qty 1

## 2018-04-16 MED ORDER — ATORVASTATIN CALCIUM 10 MG PO TABS
20.0000 mg | ORAL_TABLET | Freq: Every day | ORAL | Status: DC
  Administered 2018-04-16: 20 mg via ORAL
  Filled 2018-04-16: qty 2

## 2018-04-16 MED ORDER — VITAMIN C 500 MG PO TABS
500.0000 mg | ORAL_TABLET | Freq: Two times a day (BID) | ORAL | Status: DC
  Administered 2018-04-16 – 2018-04-17 (×2): 500 mg via ORAL
  Filled 2018-04-16 (×2): qty 1

## 2018-04-16 MED ORDER — SODIUM CHLORIDE 0.9 % IV SOLN
10.0000 mL/h | Freq: Once | INTRAVENOUS | Status: AC
  Administered 2018-04-16: 10 mL/h via INTRAVENOUS

## 2018-04-16 MED ORDER — POTASSIUM CHLORIDE CRYS ER 20 MEQ PO TBCR
20.0000 meq | EXTENDED_RELEASE_TABLET | Freq: Every day | ORAL | Status: DC
  Administered 2018-04-16 – 2018-04-17 (×2): 20 meq via ORAL
  Filled 2018-04-16 (×2): qty 1

## 2018-04-16 MED ORDER — SALINE SPRAY 0.65 % NA SOLN
1.0000 | Freq: Two times a day (BID) | NASAL | Status: DC | PRN
  Filled 2018-04-16: qty 44

## 2018-04-16 MED ORDER — POTASSIUM CHLORIDE CRYS ER 20 MEQ PO TBCR
40.0000 meq | EXTENDED_RELEASE_TABLET | Freq: Once | ORAL | Status: AC
  Administered 2018-04-16: 40 meq via ORAL
  Filled 2018-04-16: qty 2

## 2018-04-16 MED ORDER — NITROGLYCERIN 0.4 MG SL SUBL: 0.4000 mg | SUBLINGUAL_TABLET | SUBLINGUAL | Status: DC | PRN

## 2018-04-16 MED ORDER — BENEPROTEIN PO POWD
1.0000 | Freq: Three times a day (TID) | ORAL | Status: DC
  Filled 2018-04-16: qty 227

## 2018-04-16 MED ORDER — PANTOPRAZOLE SODIUM 40 MG PO TBEC
40.0000 mg | DELAYED_RELEASE_TABLET | Freq: Every day | ORAL | Status: DC
  Administered 2018-04-17: 40 mg via ORAL
  Filled 2018-04-16: qty 1

## 2018-04-16 MED ORDER — ACETAMINOPHEN 500 MG PO TABS
1000.0000 mg | ORAL_TABLET | Freq: Four times a day (QID) | ORAL | Status: DC | PRN
  Administered 2018-04-16 – 2018-04-17 (×2): 1000 mg via ORAL
  Filled 2018-04-16 (×2): qty 2

## 2018-04-16 MED ORDER — OXYMETAZOLINE HCL 0.05 % NA SOLN
1.0000 | Freq: Two times a day (BID) | NASAL | Status: DC | PRN
  Filled 2018-04-16: qty 15

## 2018-04-16 MED ORDER — PSEUDOEPHEDRINE-GUAIFENESIN ER 60-600 MG PO TB12: 1.0000 | ORAL_TABLET | Freq: Two times a day (BID) | ORAL | Status: DC | PRN

## 2018-04-16 MED ORDER — VITAMIN B-12 100 MCG PO TABS
500.0000 ug | ORAL_TABLET | Freq: Every day | ORAL | Status: DC
  Administered 2018-04-17: 500 ug via ORAL
  Filled 2018-04-16: qty 5

## 2018-04-16 MED ORDER — GABAPENTIN 300 MG PO CAPS
300.0000 mg | ORAL_CAPSULE | Freq: Two times a day (BID) | ORAL | Status: DC
  Administered 2018-04-16 – 2018-04-17 (×2): 300 mg via ORAL
  Filled 2018-04-16 (×2): qty 1

## 2018-04-16 MED ORDER — ISOSORBIDE MONONITRATE ER 30 MG PO TB24
30.0000 mg | ORAL_TABLET | Freq: Every day | ORAL | Status: DC
  Administered 2018-04-17: 30 mg via ORAL
  Filled 2018-04-16: qty 1

## 2018-04-16 MED ORDER — ZINC SULFATE 220 (50 ZN) MG PO CAPS
220.0000 mg | ORAL_CAPSULE | Freq: Every day | ORAL | Status: DC
  Administered 2018-04-17: 220 mg via ORAL
  Filled 2018-04-16: qty 1

## 2018-04-16 MED ORDER — SODIUM CHLORIDE 0.9 % IV SOLN
INTRAVENOUS | Status: DC
  Administered 2018-04-16: 18:00:00 via INTRAVENOUS

## 2018-04-16 MED ORDER — ADULT MULTIVITAMIN W/MINERALS CH
1.0000 | ORAL_TABLET | Freq: Every day | ORAL | Status: DC
  Administered 2018-04-17: 1 via ORAL
  Filled 2018-04-16: qty 1

## 2018-04-16 MED ORDER — LEVOTHYROXINE SODIUM 100 MCG PO TABS
100.0000 ug | ORAL_TABLET | Freq: Every day | ORAL | Status: DC
  Administered 2018-04-17: 100 ug via ORAL
  Filled 2018-04-16: qty 1

## 2018-04-16 NOTE — Consult Note (Signed)
Referring Provider:  Dr. Milas Gain Primary Care Physician:  Lavone Orn, MD Primary Gastroenterologist:  Dr. Cristina Gong   Reason for Consultation: Heme positive stool and anemia  HPI: [name] is a 77 y.o. male admitted to the hospital today for management of anemia detected by his primary physician, Dr. Lavone Orn.  The patient is known to our service from last month when we did consultation for heme positive stool and anemia, at which time endoscopy was unrevealing for any specific source of blood loss and it was not felt that the patient was a good colonoscopy candidate in view of his rather tenuous medical condition, especially taking into account that he had had a negative colonoscopy by Dr. Howell Rucks in March 2015, 4-1/2 years ago.  The patient has been maintained on Eliquis for atrial fibrillation, but this was stopped within the past week when the patient reported dark stools.  When the patient's lab work from yesterday's office visit came back showing a low blood count (roughly a gram and a half below baseline), he was directed to the emergency room.  There, his stool was noted to be Hemoccult positive and his hemoglobin was 6.5, as compared to 9.2 as recently as 11 days earlier.  We are being asked to opine as to whether any GI evaluation is warranted.   Past Medical History:  Diagnosis Date  . Arthritis   . Atherosclerotic heart disease   . Back pain   . Bruises easily    takes Pletal daily and ASA 325mg  daily  . Cataract immature    right  . Chest pain   . Colon polyps   . Complication of anesthesia    hard to wake up with bypass surgery  . Cyst    on perineum;takes Doxycycline daily  . Diabetes mellitus    takes Glipizide,Metformin,and Actos daily  . Diarrhea   . Dyspnea    when lying flat  . Edema    right leg knee down swollen since fall 3 wks ago  . Foot drop, left   . GERD (gastroesophageal reflux disease)    Rolaids as needed-occ reflux  .  Hemorrhoids   . History of kidney stones    at age 57   . Hyperlipidemia    takes Tricor and Lipitor daily  . Hypertension    takes Altace,Amlodipine,and Nadolol daily  . Hypothyroidism   . Kidney stone    35+yrs ago  . Muscle pain   . Nasal polyps    hx of  . Peripheral edema    wears knee length hose-told by podiatrist to wear these  . Peripheral neuropathy    takes Gabapentin daily  . Pneumonia    as a child  . PVD (peripheral vascular disease) ([place])   . Renal insufficiency   . Seasonal allergies    takes Mucinex and Zyrtec prn  . Urinary frequency    urgency/takes Vesicare daily    Past Surgical History:  Procedure Laterality Date  . Aortobifemoral bypass    . APPLICATION OF WOUND VAC Left 10/25/2017   Procedure: Wound vac placement to left groin.;  Surgeon: Elam Dutch, MD;  Location: [facility] OR;  Service: Vascular;  Laterality: Left;  . APPLICATION OF WOUND VAC Left 11/10/2017   Procedure: APPLICATION OF WOUND VAC;  Surgeon: Elam Dutch, MD;  Location: [place];  Service: Vascular;  Laterality: Left;  . CARDIAC CATHETERIZATION  most recent 05/2011   total of 4  . CARDIOVERSION N/A  03/22/2018   Procedure: CARDIOVERSION;  Surgeon: Jerline Pain, MD;  Location: [facility] ENDOSCOPY;  Service: Cardiovascular;  Laterality: N/A;  . CAROTID ANGIOGRAM N/A 06/05/2011   Procedure: CAROTID Cyril Loosen;  Surgeon: Elam Dutch, MD;  Location: [facility] CATH LAB;  Service: Cardiovascular;  Laterality: N/A;  . CAROTID ENDARTERECTOMY  04/08/2011   left CEA  . cataract surgery Bilateral    left  . COLONOSCOPY    . COLONOSCOPY WITH PROPOFOL N/A 08/22/2013   Procedure: COLONOSCOPY WITH PROPOFOL;  Surgeon: Garlan Fair, MD;  Location: [facility] ENDOSCOPY;  Service: Endoscopy;  Laterality: N/A;  . CORONARY ARTERY BYPASS GRAFT  09/09/2011   Procedure: CORONARY ARTERY BYPASS GRAFTING (CABG);  Surgeon: Gaye Pollack, MD;  Location: [place];  Service: Open Heart Surgery;  Laterality: N/A;  Coronary artery  bypass grafting  x three with Right saphenous vein harvested endoscopically and left internal mammary artery  . ENDARTERECTOMY  04/08/2011   Procedure: ENDARTERECTOMY CAROTID;  Surgeon: Elam Dutch, MD;  Location: [facility] OR;  Service: Vascular;  Laterality: Left;  with patch angioplasty  . ENDARTERECTOMY  09/09/2011   Procedure: ENDARTERECTOMY CAROTID;  Surgeon: Elam Dutch, MD;  Location: [facility] OR;  Service: Vascular;  Laterality: Right;  with patch angioplasty  . ESOPHAGOGASTRODUODENOSCOPY (EGD) WITH PROPOFOL N/A 03/10/2018   Procedure: ESOPHAGOGASTRODUODENOSCOPY (EGD) WITH PROPOFOL;  Surgeon: Arta Silence, MD;  Location: [place];  Service: Endoscopy;  Laterality: N/A;  . FEMORAL-POPLITEAL BYPASS GRAFT Left 10/25/2017   Procedure: Left Common Femoral Endarterectomy and perfundaplasty, Left BYPASS GRAFT FEMORAL-BELOW KNEE POPLITEAL ARTERY.;  Surgeon: Elam Dutch, MD;  Location: [place];  Service: Vascular;  Laterality: Left;  . GROIN DEBRIDEMENT Left 11/10/2017   Procedure: GROIN DEBRIDEMENT;  Surgeon: Elam Dutch, MD;  Location: [place];  Service: Vascular;  Laterality: Left;  . LEFT HEART CATHETERIZATION WITH CORONARY ANGIOGRAM N/A 07/09/2011   Procedure: LEFT HEART CATHETERIZATION WITH CORONARY ANGIOGRAM;  Surgeon: Sinclair Grooms, MD;  Location: [facility] CATH LAB;  Service: Cardiovascular;  Laterality: N/A;  . LOWER EXTREMITY ANGIOGRAPHY N/A 10/11/2017   Procedure: LOWER EXTREMITY ANGIOGRAPHY;  Surgeon: Waynetta Sandy, MD;  Location: [place] CV LAB;  Service: Cardiovascular;  Laterality: N/A;  . Reimplantation of inferior mesenteric artery    . Repair of infrarenal abdominal aortic aneurysm    . RIGHT HEART CATH N/A 04/04/2018   Procedure: RIGHT HEART CATH;  Surgeon: Larey Dresser, MD;  Location: [place] CV LAB;  Service: Cardiovascular;  Laterality: N/A;  . SHOULDER ARTHROSCOPY W/ ROTATOR CUFF REPAIR     right and left-open procedures  . TEE WITHOUT  CARDIOVERSION N/A 03/22/2018   Procedure: TRANSESOPHAGEAL ECHOCARDIOGRAM (TEE);  Surgeon: Jerline Pain, MD;  Location: [facility] ENDOSCOPY;  Service: Cardiovascular;  Laterality: N/A;  . TONSILLECTOMY     at age 45  . TRIGGER FINGER RELEASE Right 06/28/2014   Procedure: RIGHT LONG TRIGGER RELEASE ;  Surgeon: Leanora Cover, MD;  Location: [place];  Service: Orthopedics;  Laterality: Right;  . wisdom teeth extracted     as a teenager  . WOUND DEBRIDEMENT Left 11/05/2017   Procedure: DEBRIDEMENT WOUND LEFT GROIN with wound vac placement;  Surgeon: Elam Dutch, MD;  Location: [facility] OR;  Service: Vascular;  Laterality: Left;    Prior to Admission medications   Medication Sig Start Date End Date Taking? Authorizing Provider  acetaminophen (TYLENOL) 500 MG tablet Take 1,000 mg by mouth every 6 (six) hours as needed for headache (  pain).   Yes [provider]  amiodarone (PACERONE) 200 MG tablet Take 1 tablet (200 mg total) by mouth 2 (two) times daily. 03/17/18  Yes Isaiah Serge, NP  Ascorbic Acid (VITAMIN C PO) Take 1 tablet by mouth 2 (two) times daily.   Yes [provider]  atorvastatin (LIPITOR) 20 MG tablet Take 20 mg by mouth daily after supper.    Yes [provider]  carvedilol (COREG) 3.125 MG tablet Take 1 tablet (3.125 mg total) by mouth 2 (two) times daily. 04/06/18  Yes Purohit, Konrad Dolores, MD  fenofibrate 54 MG tablet Take 54 mg by mouth every morning.    Yes [provider]  gabapentin (NEURONTIN) 300 MG capsule Take 300 mg by mouth 2 (two) times daily after a meal.    Yes [provider]  hydrALAZINE (APRESOLINE) 25 MG tablet Take 1 tablet (25 mg total) by mouth every 8 (eight) hours. 04/06/18 05/06/18 Yes Purohit, Konrad Dolores, MD  isosorbide mononitrate (IMDUR) 30 MG 24 hr tablet Take 1 tablet (30 mg total) by mouth daily. Patient taking differently: Take 30 mg by mouth every morning.  04/07/18 05/07/18 Yes Purohit, Konrad Dolores, MD   levothyroxine (SYNTHROID, LEVOTHROID) 100 MCG tablet Take 100 mcg by mouth daily before breakfast.  01/07/18  Yes [provider]  LORazepam (ATIVAN) 0.5 MG tablet Take 0.5 mg by mouth at bedtime as needed for anxiety.   Yes [provider]  methylcellulose (ARTIFICIAL TEARS) 1 % ophthalmic solution Place 1 drop into both eyes 2 (two) times daily as needed (dry eyes).   Yes [provider]  Multiple Vitamin (MULTIVITAMIN WITH MINERALS) TABS tablet Take 1 tablet by mouth daily. Patient taking differently: Take 1 tablet by mouth every morning.  12/03/17  Yes Rhyne, Samantha J, PA-C  nitroGLYCERIN (NITROSTAT) 0.4 MG SL tablet PLACE 1 TABLET UNDER TONGUE EVERY 5 MINUTES AS NEEDED FOR CHEST PAIN. Patient taking differently: Place 0.4 mg under the tongue every 5 (five) minutes as needed for chest pain.  09/17/15  Yes Belva Crome, MD  pantoprazole (PROTONIX) 40 MG tablet Take 1 tablet (40 mg total) by mouth daily. Patient taking differently: Take 40 mg by mouth 2 (two) times daily. Take twice daily until 04/26/18 03/11/18 03/11/19 Yes Pokhrel, Laxman, MD  potassium chloride SA (K-DUR,KLOR-CON) 20 MEQ tablet Take by mouth as directed. Patient taking differently: Take 20 mEq by mouth See admin instructions. Take one tablet (20 meq) by mouth when directed to do so by MD 03/18/18  Yes Isaiah Serge, NP  pseudoephedrine-guaifenesin (MUCINEX D) 60-600 MG per tablet Take 1 tablet by mouth every 12 (twelve) hours as needed for congestion.    Yes [provider]  tamsulosin (FLOMAX) 0.4 MG CAPS capsule Take 0.4 mg by mouth every Monday, Wednesday, and Friday.    Yes [provider]  torsemide (DEMADEX) 20 MG tablet Take 4 tablets (80 mg total) by mouth daily with breakfast. Patient taking differently: Take 60-80 mg by mouth See admin instructions. Take 80mg  in the morning, and 60mg  in the evening 04/07/18 05/07/18 Yes Purohit, Konrad Dolores, MD  torsemide (DEMADEX) 20 MG  tablet Take 60-80 mg by mouth See admin instructions. Take 80mg  in the AM and 60mg  in the PM.   Yes [provider]  vitamin B-12 (CYANOCOBALAMIN) 500 MCG tablet Take 500 mcg by mouth every morning.    Yes [provider]  zinc gluconate 50 MG tablet Take 50 mg by mouth every  morning.    Yes [provider]  apixaban (ELIQUIS) 5 MG TABS tablet Take 1 tablet (5 mg total) by mouth 2 (two) times daily. Patient not taking: Reported on 04/16/2018 03/03/18 04/02/18  Arrien, Jimmy Picket, MD    Current Facility-Administered Medications  Medication Dose Route Frequency Provider Last Rate Last Dose  . 0.9 %  sodium chloride infusion   Intravenous Continuous Cristal Deer, MD 50 mL/hr at 04/16/18 1744    . acetaminophen (TYLENOL) tablet 1,000 mg  1,000 mg Oral Q6H PRN Cristal Deer, MD   1,000 mg at 04/16/18 1905  . amiodarone (PACERONE) tablet 200 mg  200 mg Oral BID Cristal Deer, MD      . atorvastatin (LIPITOR) tablet 20 mg  20 mg Oral QPC supper Cristal Deer, MD   20 mg at 04/16/18 1736  . carvedilol (COREG) tablet 3.125 mg  3.125 mg Oral BID Cristal Deer, MD      . Derrill Memo ON 04/17/2018] fenofibrate tablet 54 mg  54 mg Oral Daily Cristal Deer, MD      . gabapentin (NEURONTIN) capsule 300 mg  300 mg Oral BID PC Cristal Deer, MD   300 mg at 04/16/18 1638  . hydrALAZINE (APRESOLINE) tablet 25 mg  25 mg Oral Q8H Cristal Deer, MD   25 mg at 04/16/18 1639  . [START ON 04/17/2018] isosorbide mononitrate (IMDUR) 24 hr tablet 30 mg  30 mg Oral Daily Cristal Deer, MD      . Derrill Memo ON 04/17/2018] levothyroxine (SYNTHROID, LEVOTHROID) tablet 100 mcg  100 mcg Oral QAC breakfast Cristal Deer, MD      . Derrill Memo ON 04/17/2018] multivitamin with minerals tablet 1 tablet  1 tablet Oral Daily Cristal Deer, MD      . nitroGLYCERIN (NITROSTAT) SL tablet 0.4 mg  0.4 mg Sublingual Q5 min PRN Cristal Deer, MD      . oxymetazoline (AFRIN) 0.05 % nasal  spray 1 spray  1 spray Each Nare BID PRN Cristal Deer, MD      . Derrill Memo ON 04/17/2018] pantoprazole (PROTONIX) EC tablet 40 mg  40 mg Oral Daily Cristal Deer, MD      . potassium chloride SA (K-DUR,KLOR-CON) CR tablet 20 mEq  20 mEq Oral Daily Cristal Deer, MD   20 mEq at 04/16/18 1638  . protein supplement (RESOURCE BENEPROTEIN) powder 6 g  1 scoop Oral TID WC Cristal Deer, MD      . sodium chloride (OCEAN) 0.65 % nasal spray 1 spray  1 spray Each Nare BID PRN Cristal Deer, MD      . Derrill Memo ON 04/18/2018] tamsulosin (FLOMAX) capsule 0.4 mg  0.4 mg Oral Q M,W,F Cristal Deer, MD      . Derrill Memo ON 04/17/2018] torsemide (DEMADEX) tablet 80 mg  80 mg Oral Q breakfast Cristal Deer, MD      . Derrill Memo ON 04/17/2018] vitamin B-12 (CYANOCOBALAMIN) tablet 500 mcg  500 mcg Oral Daily Cristal Deer, MD      . vitamin C (ASCORBIC ACID) tablet 500 mg  500 mg Oral BID Cristal Deer, MD      . Derrill Memo ON 04/17/2018] zinc sulfate capsule 220 mg  220 mg Oral Daily Cristal Deer, MD        Allergies as of 04/16/2018 - Review Complete 04/16/2018  Allergen Reaction Noted  . Amoxicillin-pot clavulanate Nausea Only 12/22/2010  . Morphine and related Other (See Comments) 10/21/2017  . Percocet [oxycodone-acetaminophen] Other (See Comments) 03/10/2018  . Restoril [temazepam] Other (See Comments)  03/10/2018    Family History  Problem Relation Age of Onset  . Heart disease Mother   . Heart disease Father   . Anesthesia problems Neg Hx   . Hypotension Neg Hx   . Malignant hyperthermia Neg Hx   . Pseudochol deficiency Neg Hx     Social History   Socioeconomic History  . Marital status: Married    Spouse name: Not on file  . Number of children: 2  . Years of education: Not on file  . Highest education level: Professional school degree (e.g., MD, DDS, DVM, JD)  Occupational History  . Occupation: retired Chief Executive Officer  Social Needs  . Financial resource strain: Not on file  .  Food insecurity:    Worry: Not on file    Inability: Not on file  . Transportation needs:    Medical: Not on file    Non-medical: Not on file  Tobacco Use  . Smoking status: Former Smoker    Years: 40.00    Types: Cigarettes    Last attempt to quit: 10/27/2005    Years since quitting: 12.4  . Smokeless tobacco: Never Used  Substance and Sexual Activity  . Alcohol use: Yes    Alcohol/week: 2.0 - 3.0 standard drinks    Types: 2 - 3 Cans of beer per week    Comment: occasional- weekly  . Drug use: No  . Sexual activity: Never  Lifestyle  . Physical activity:    Days per week: Not on file    Minutes per session: Not on file  . Stress: Not on file  Relationships  . Social connections:    Talks on phone: Patient refused    Gets together: Patient refused    Attends religious service: Patient refused    Active member of club or organization: Patient refused    Attends meetings of clubs or organizations: Patient refused    Relationship status: Patient refused  . Intimate partner violence:    Fear of current or ex partner: Patient refused    Emotionally abused: Patient refused    Physically abused: Patient refused    Forced sexual activity: Patient refused  Other Topics Concern  . Not on file  Social History Narrative   He lives with wife in a 1 story home, three steps to entire home.  2 children.  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      Review of Systems: The patient denies localizing GI tract symptoms (apart from the dark stools).  Physical Exam: Vital signs in last 24 hours: Temp:  [97.6 F (36.4 C)-98.6 F (37 C)] 97.9 F (36.6 C) (11/23 1730) Pulse Rate:  [59-66] 60 (11/23 1730) Resp:  [14-30] 18 (11/23 1730) BP: (123-157)/(48-132) 157/57 (11/23 1730) SpO2:  [92 %-100 %] 97 % (11/23 1730) Last BM Date: 04/15/18 This is a heavyset gentleman propped up in bed, minimally dyspneic on nasal cannula oxygen, complexion is pale even after transfusion of 2 units of red  cells.  He has significant lower extremity edema with chronic stasis ulcerations in various bandages.  He is not in acute distress, but is frustrated by lying in his own urine despite having called for the nursing tech a while ago.  Heart exam shows no obvious arrhythmia (has history of atrial fibrillation), and chest is clear anteriorly.  Abdomen is quite obese and without any evident tenderness.  The patient is coherent and mood is appropriate.  No evident focal neurologic deficits.  Intake/Output from previous day: No intake/output  data recorded. Intake/Output this shift: No intake/output data recorded.  Lab Results: Recent Labs    04/16/18 0914  WBC 4.7  HGB 6.5*  HCT 21.9*  PLT 299   BMET Recent Labs    04/16/18 0914  NA 135  K 3.3*  CL 97*  CO2 28  GLUCOSE 194*  BUN 48*  CREATININE 2.12*  CALCIUM 8.7*   LFT Recent Labs    04/16/18 0914  PROT 6.3*  ALBUMIN 2.9*  AST 32  ALT 22  ALKPHOS 47  BILITOT 0.8   PT/INR No results for input(s): LABPROT, INR in the last 72 hours.  Studies/Results: No results found.  Impression: Acute on chronic anemia with heme positive stool, having been on full anticoagulation with Eliquis until recently.  Plan: I do not feel that there is a role for GI evaluation in this patient. This would include repeat upper endoscopy, updating his colonoscopy, or doing a small bowel video capsule endoscopy.   First of all, it would be moderately high risk.  More importantly, the yield would be low, probably just a few percent that we would find a treatable, correctable lesion.    Rather, I feel the most appropriate and important intervention for this patient is keeping him off anticoagulation, admittedly at some risk for thromboembolic complications but at the moment, the "clear and present danger" to this patient is his blood loss and associated anemia.  Going forward, to the degree that supportive care is felt appropriate in this hospice  patient, periodic hemoglobin determinations with palliative transfusions for symptomatic anemia might be considered, if that is not at odds with the hospice approach.  We will sign off, but please do not hesitate to call us if it would be helpful to discuss this patient's care in more detail.  Note that the patient is strongly desirous of going home tomorrow, and I see no reason for him not to be discharged, from the GI tract standpoint.    LOS: 0 days   Youlanda Mighty Eudora Guevarra  04/16/2018, 8:10 PM   Pager [phone] If no answer or after 5 PM call [phone]

## 2018-04-16 NOTE — ED Notes (Signed)
ED Provider at bedside. 

## 2018-04-16 NOTE — Progress Notes (Signed)
RN paged C. Bodenheimer, NP to make him aware patient has bibasilar lower lobe fine crackles and hx of CHF.  RN wonders if NP wants to continue IV fluids at 50 ml/ hr, awaiting response.  P.J. Linus Mako, RN

## 2018-04-16 NOTE — H&P (Addendum)
History and Physical  [name] BRA:[id number] DOB: 01/15/1941 DOA: 04/16/2018  Referring physician: Roque [name] PCP: Anthony Orn, MD  Outpatient Specialists: hospice Patient coming from: Home & is able to ambulate yes  Chief Complaint: Dark stool and low hemoglobin  HPI: [name] is a 77 y.o. male with medical history significant for atrial fibrillation on Eliquis and aspirin which was stopped on his last admission of October 2019, chronic anemia which usually runs between 8 and 10 and he has had transfusion in the past and GI bleed.  He is a hospice patient for end-stage heart failure, past medical history of congestive heart failure, atherosclerotic heart disease, hypertension hyperlipidemia, type 2 diabetes mellitus, has a wound in the leg under the care of a wound care nurse has bilateral Unna boots on both legs who presented from home because his primary care provider called to inform him that his hemoglobin was low was 6.2 and advised him to go to the ER for blood transfusion.  He was recently admitted for sepsis AKI and his renal function improved with the treatment of sepsis he did not require to go on dialysis.  His current creatinine is 2.1 he said that his baseline is around 1.6.  He was seen previously by Dr. Cristina [name] he who is his gastroenterologist for a GI bleed they did endoscopy but that was unremarkable.  They did not do colonoscopy then because he thought it was upper GI.  Patient denies any abdominal [name] reports feeling cold and somewhat weak.  ED Course: ED per provider did rectal exam that was positive for occult blood.  Noted to have hemoglobin of 6.5 with him hematocrit of 21.9 and started on blood transfusion also received normal saline IV  Review of Systems: Constitutional: No fever Respiratory: No shortness of breath  Cardiovascular: No chest [name] Gastrointestinal: Positive blood in stool with dark stool no abdominal [name] nausea vomiting or  diarrhea Skin: Positive lower extremity wounds.  Neurological: Generalized weakness Psychiatric/Behavioral: Denies confusion All other systems reviewed and are negative.     Past Medical History:  Diagnosis Date  . Arthritis   . Atherosclerotic heart disease   . Back [name]   . Bruises easily    takes Pletal daily and ASA 325mg  daily  . Cataract immature    right  . Chest [name]   . Colon polyps   . Complication of anesthesia    hard to wake up with bypass surgery  . Cyst    on perineum;takes Doxycycline daily  . Diabetes mellitus    takes Glipizide,Metformin,and Actos daily  . Diarrhea   . Dyspnea    when lying flat  . Edema    right leg knee down swollen since fall 3 wks ago  . Foot drop, left   . GERD (gastroesophageal reflux disease)    Rolaids as needed-occ reflux  . Hemorrhoids   . History of kidney stones    at age 41   . Hyperlipidemia    takes Tricor and Lipitor daily  . Hypertension    takes Altace,Amlodipine,and Nadolol daily  . Hypothyroidism   . Kidney stone    35+yrs ago  . Muscle [name]   . Nasal polyps    hx of  . Peripheral edema    wears knee length hose-told by podiatrist to wear these  . Peripheral neuropathy    takes Gabapentin daily  . Pneumonia    as a child  . PVD (peripheral vascular disease) (  [place])   . Renal insufficiency   . Seasonal allergies    takes Mucinex and Zyrtec prn  . Urinary frequency    urgency/takes Vesicare daily   Past Surgical History:  Procedure Laterality Date  . Aortobifemoral bypass    . APPLICATION OF WOUND VAC Left 10/25/2017   Procedure: Wound vac placement to left groin.;  Surgeon: Anthony Dutch, MD;  Location: [facility] OR;  Service: Vascular;  Laterality: Left;  . APPLICATION OF WOUND VAC Left 11/10/2017   Procedure: APPLICATION OF WOUND VAC;  Surgeon: Anthony Dutch, MD;  Location: [place];  Service: Vascular;  Laterality: Left;  . CARDIAC CATHETERIZATION  most recent 05/2011   total of 4  . CARDIOVERSION  N/A 03/22/2018   Procedure: CARDIOVERSION;  Surgeon: Anthony Pain, MD;  Location: [facility] ENDOSCOPY;  Service: Cardiovascular;  Laterality: N/A;  . CAROTID ANGIOGRAM N/A 06/05/2011   Procedure: CAROTID [name];  Surgeon: Anthony Dutch, MD;  Location: [facility] CATH LAB;  Service: Cardiovascular;  Laterality: N/A;  . CAROTID ENDARTERECTOMY  04/08/2011   left CEA  . cataract surgery Bilateral    left  . COLONOSCOPY    . COLONOSCOPY WITH PROPOFOL N/A 08/22/2013   Procedure: COLONOSCOPY WITH PROPOFOL;  Surgeon: Anthony Fair, MD;  Location: [facility] ENDOSCOPY;  Service: Endoscopy;  Laterality: N/A;  . CORONARY ARTERY BYPASS GRAFT  09/09/2011   Procedure: CORONARY ARTERY BYPASS GRAFTING (CABG);  Surgeon: Anthony Pollack, MD;  Location: [place];  Service: Open Heart Surgery;  Laterality: N/A;  Coronary artery bypass grafting  x three with Right saphenous vein harvested endoscopically and left internal mammary artery  . ENDARTERECTOMY  04/08/2011   Procedure: ENDARTERECTOMY CAROTID;  Surgeon: Anthony Dutch, MD;  Location: [facility] OR;  Service: Vascular;  Laterality: Left;  with patch angioplasty  . ENDARTERECTOMY  09/09/2011   Procedure: ENDARTERECTOMY CAROTID;  Surgeon: Anthony Dutch, MD;  Location: [facility] OR;  Service: Vascular;  Laterality: Right;  with patch angioplasty  . ESOPHAGOGASTRODUODENOSCOPY (EGD) WITH PROPOFOL N/A 03/10/2018   Procedure: ESOPHAGOGASTRODUODENOSCOPY (EGD) WITH PROPOFOL;  Surgeon: Anthony Silence, MD;  Location: [place];  Service: Endoscopy;  Laterality: N/A;  . FEMORAL-POPLITEAL BYPASS GRAFT Left 10/25/2017   Procedure: Left Common Femoral Endarterectomy and perfundaplasty, Left BYPASS GRAFT FEMORAL-BELOW KNEE POPLITEAL ARTERY.;  Surgeon: Anthony Dutch, MD;  Location: [place];  Service: Vascular;  Laterality: Left;  . GROIN DEBRIDEMENT Left 11/10/2017   Procedure: GROIN DEBRIDEMENT;  Surgeon: Anthony Dutch, MD;  Location: [place];  Service: Vascular;  Laterality: Left;  . LEFT HEART  CATHETERIZATION WITH CORONARY ANGIOGRAM N/A 07/09/2011   Procedure: LEFT HEART CATHETERIZATION WITH CORONARY ANGIOGRAM;  Surgeon: Sinclair Grooms, MD;  Location: [facility] CATH LAB;  Service: Cardiovascular;  Laterality: N/A;  . LOWER EXTREMITY ANGIOGRAPHY N/A 10/11/2017   Procedure: LOWER EXTREMITY ANGIOGRAPHY;  Surgeon: Waynetta Sandy, MD;  Location: [place] CV LAB;  Service: Cardiovascular;  Laterality: N/A;  . Reimplantation of inferior mesenteric artery    . Repair of infrarenal abdominal aortic aneurysm    . RIGHT HEART CATH N/A 04/04/2018   Procedure: RIGHT HEART CATH;  Surgeon: Larey Dresser, MD;  Location: [place] CV LAB;  Service: Cardiovascular;  Laterality: N/A;  . SHOULDER ARTHROSCOPY W/ ROTATOR CUFF REPAIR     right and left-open procedures  . TEE WITHOUT CARDIOVERSION N/A 03/22/2018   Procedure: TRANSESOPHAGEAL ECHOCARDIOGRAM (TEE);  Surgeon: Anthony Pain, MD;  Location: [facility] ENDOSCOPY;  Service: Cardiovascular;  Laterality:  N/A;  . TONSILLECTOMY     at age 9  . TRIGGER FINGER RELEASE Right 06/28/2014   Procedure: RIGHT LONG TRIGGER RELEASE ;  Surgeon: Leanora Cover, MD;  Location: [place];  Service: Orthopedics;  Laterality: Right;  . wisdom teeth extracted     as a teenager  . WOUND DEBRIDEMENT Left 11/05/2017   Procedure: DEBRIDEMENT WOUND LEFT GROIN with wound vac placement;  Surgeon: Anthony Dutch, MD;  Location: [facility] OR;  Service: Vascular;  Laterality: Left;    Social History:  reports that he quit smoking about 12 years ago. His smoking use included cigarettes. He quit after 40.00 years of use. He has never used smokeless tobacco. He reports that he drinks about 2.0 - 3.0 standard drinks of alcohol per week. He reports that he does not use drugs.   Allergies  Allergen Reactions  . Amoxicillin-Pot Clavulanate Nausea Only    Has patient had a PCN reaction causing immediate rash, facial/tongue/throat swelling, SOB or lightheadedness with  hypotension: No Has patient had a PCN reaction causing severe rash involving mucus membranes or skin necrosis: No Has patient had a PCN reaction that required hospitalization: No Has patient had a PCN reaction occurring within the last 10 years: No If all of the above answers are "NO", then may proceed with Cephalosporin use.   Marland Kitchen Morphine And Related Other (See Comments)    hallucinations  . Percocet [Oxycodone-Acetaminophen] Other (See Comments)    Agitation and Increased Confusion. Family asks to please not order this medication for him.   . Restoril [Temazepam] Other (See Comments)    Confusion and Agitation. Has taken in past, but has not reacted well. Family requests to not give this mediation.     Family History  Problem Relation Age of Onset  . Heart disease Mother   . Heart disease Father   . Anesthesia problems Neg Hx   . Hypotension Neg Hx   . Malignant hyperthermia Neg Hx   . Pseudochol deficiency Neg Hx       Prior to Admission medications   Medication Sig Start Date End Date Taking? Authorizing Provider  acetaminophen (TYLENOL) 500 MG tablet Take 1,000 mg by mouth every 6 (six) hours as needed for headache ([name]).    [provider]  amiodarone (PACERONE) 200 MG tablet Take 1 tablet (200 mg total) by mouth 2 (two) times daily. 03/17/18   Isaiah Serge, NP  apixaban (ELIQUIS) 5 MG TABS tablet Take 1 tablet (5 mg total) by mouth 2 (two) times daily. Patient taking differently: Take 5 mg by mouth 2 (two) times daily after a meal.  03/03/18 04/02/18  Arrien, Jimmy Picket, MD  Ascorbic Acid (VITAMIN C PO) Take 1 tablet by mouth 2 (two) times daily.    [provider]  atorvastatin (LIPITOR) 20 MG tablet Take 20 mg by mouth daily after supper.     [provider]  carvedilol (COREG) 3.125 MG tablet Take 1 tablet (3.125 mg total) by mouth 2 (two) times daily. 04/06/18   Purohit, Konrad Dolores, MD  fenofibrate 54 MG tablet Take 54 mg by mouth daily.     [provider]  gabapentin (NEURONTIN) 300 MG capsule Take 300 mg by mouth 2 (two) times daily after a meal.     [provider]  hydrALAZINE (APRESOLINE) 25 MG tablet Take 1 tablet (25 mg total) by mouth every 8 (eight) hours. 04/06/18 05/06/18  Purohit, Konrad Dolores, MD  isosorbide mononitrate (IMDUR)  30 MG 24 hr tablet Take 1 tablet (30 mg total) by mouth daily. 04/07/18 05/07/18  Purohit, Konrad Dolores, MD  levothyroxine (SYNTHROID, LEVOTHROID) 100 MCG tablet Take 100 mcg by mouth daily before breakfast.  01/07/18   [provider]  methylcellulose (ARTIFICIAL TEARS) 1 % ophthalmic solution Place 1 drop into both eyes 2 (two) times daily as needed (dry eyes).    [provider]  Multiple Vitamin (MULTIVITAMIN WITH MINERALS) TABS tablet Take 1 tablet by mouth daily. 12/03/17   Rhyne, Hulen Shouts, PA-C  nitroGLYCERIN (NITROSTAT) 0.4 MG SL tablet PLACE 1 TABLET UNDER TONGUE EVERY 5 MINUTES AS NEEDED FOR CHEST [name]. Patient taking differently: Place 0.4 mg under the tongue every 5 (five) minutes as needed for chest [name].  09/17/15   Belva Crome, MD  oxymetazoline (VICKS SINEX) 0.05 % nasal spray Place 1 spray into both nostrils 2 (two) times daily as needed for congestion.    [provider]  pantoprazole (PROTONIX) 40 MG tablet Take 1 tablet (40 mg total) by mouth daily. 03/11/18 03/11/19  Pokhrel, Corrie Mckusick, MD  potassium chloride SA (K-DUR,KLOR-CON) 20 MEQ tablet Take by mouth as directed. Patient taking differently: Take 20 mEq by mouth See admin instructions. Take one tablet (20 meq) by mouth when directed to do so by MD 03/18/18   Isaiah Serge, NP  pseudoephedrine-guaifenesin [facility] D) 60-600 MG per tablet Take 1 tablet by mouth every 12 (twelve) hours as needed for congestion.     [provider]  sodium chloride (OCEAN) 0.65 % SOLN nasal spray Place 1 spray into both nostrils 2 (two) times daily as needed for congestion.    [provider]    tamsulosin (FLOMAX) 0.4 MG CAPS capsule Take 0.4 mg by mouth every Monday, Wednesday, and Friday.     [provider]  torsemide (DEMADEX) 20 MG tablet Take 4 tablets (80 mg total) by mouth daily with breakfast. Patient not taking: Reported on 04/08/2018 04/07/18 05/07/18  Purohit, Konrad Dolores, MD  torsemide (DEMADEX) 20 MG tablet Take 60-80 mg by mouth See admin instructions. Take 80mg  in the AM and 60mg  in the PM.    [provider]  vitamin B-12 (CYANOCOBALAMIN) 500 MCG tablet Take 500 mcg by mouth daily.    [provider]  zinc gluconate 50 MG tablet Take 50 mg by mouth daily.    [provider]    Physical Exam: BP (!) 148/132   Pulse 64   Temp 97.9 F (36.6 C) (Oral)   Resp (!) 28   SpO2 100%   Exam:  . General: 77 y.o. year-old male well developed well nourished in no acute distress.  Alert and oriented x3. Marland Kitchen HEENT . Cardiovascular: Regular rate and rhythm with no rubs or gallops.  No thyromegaly or JVD noted.   Marland Kitchen Respiratory: Clear to auscultation with no wheezes or rales. Good inspiratory effort. . Abdomen: Soft, mild tender left lower quadrant to deep palpation nondistended with normal bowel sounds x4 quadrants. . Musculoskeletal: Bilateral Unna boots, lower extremity edema could not be fully assessed due to the Unna boots, as well as pulses in extremities. . Skin: Patient has Unna boots due to chronic leg ulcer  . Psychiatry: Mood is appropriate for condition and setting very pleasant.  He is a hospice patient but stated that he does not want to die and he is going to do everything he can to stay alive.  Labs on Admission:  Basic Metabolic Panel: Recent Labs  Lab 04/16/18 0914  NA 135  K 3.3*  CL 97*  CO2 28  GLUCOSE 194*  BUN 48*  CREATININE 2.12*  CALCIUM 8.7*   Liver Function Tests: Recent Labs  Lab 04/16/18 0914  AST 32  ALT 22  ALKPHOS 47  BILITOT 0.8  PROT 6.3*  ALBUMIN 2.9*   No results for input(s):  LIPASE, AMYLASE in the last 168 hours. No results for input(s): AMMONIA in the last 168 hours. CBC: Recent Labs  Lab 04/16/18 0914  WBC 4.7  NEUTROABS 3.1  HGB 6.5*  HCT 21.9*  MCV 90.5  PLT 299   Cardiac Enzymes: No results for input(s): CKTOTAL, CKMB, CKMBINDEX, TROPONINI in the last 168 hours.  BNP (last 3 results) Recent Labs    02/25/18 1340 03/18/18 1325 03/24/18 1019  BNP 1,809.7* 2,440.0* 1,916.1*    ProBNP (last 3 results) Recent Labs    02/03/18 1217 02/24/18 1243  PROBNP 10,666* 17,251*    CBG: No results for input(s): GLUCAP in the last 168 hours.  Radiological Exams on Admission: No results found.  EKG: none  Assessment/Plan Present on Admission: . Diabetes mellitus type 2 with complications ([place]) . Chronic kidney disease, stage III (moderate) (HCC) . Benign essential HTN . Hyperlipidemia . Chronic back [name] . Combined systolic and diastolic heart failure ([place]) . Malnutrition of moderate degree . Acute on chronic combined systolic and diastolic CHF (congestive heart failure) ([place]) . GI bleed . Acute blood loss anemia . Acute on chronic anemia  Principal Problem:   Acute blood loss anemia Active Problems:   Diabetes mellitus type 2 with complications (HCC)   S/P CABG (coronary artery bypass graft)   Acute on chronic combined systolic and diastolic CHF (congestive heart failure) (HCC)   Chronic kidney disease, stage III (moderate) (HCC)   Malnutrition of moderate degree   Benign essential HTN   GI bleed   Hyperlipidemia   Chronic back [name]   Combined systolic and diastolic heart failure (HCC)   Acute on chronic anemia  1.  GI bleed EGD was done and was negative patient has not had colonoscopy for few years unsure if GI will be able to do colonoscopy.  ED MD has already consulted with gastroenterologist who will be coming to see patient  2.  Anemia due to chronic blood loss hemoglobin is 6.5 patient has started receiving  2 units of  blood transfusion in the emergency department  3.  Acute on chronic kidney disease current creatinine is 2.1 he received some fluids we will continue mild gentle IV hydration  4.  Hypokalemia mild 3.3 I will replace with p.o. potassium 40 mEq.  5.  Congestive heart failure chronic but stable at this time patient is a hospice patient.  She has been seen by the hospice coordinator nurse they agree with admission for blood transfusion because he will need to have blood transfusions slowly.  Last BNP in March 24, 2018 was high,1,916 not was ordered at this time as he was not symptomatic  6.  Supplement and diet malnutrition albumin is 2.9 or other supplements increase  Severity of Illness: The appropriate patient status for this patient is OBSERVATION. Observation status is judged to be reasonable and necessary in order to provide the required intensity of service to ensure the patient's safety. The patient's presenting symptoms, physical exam findings, and initial radiographic and laboratory data in the context of their medical condition is felt to  place them at decreased risk for further clinical deterioration. Furthermore, it is anticipated that the patient will be medically stable for discharge from the hospital within 2 midnights of admission. The following factors support the patient status of observation.   " The patient's presenting symptoms include with hemoglobin of 6.5 requiring blood transfusion and dark stool history of GI bleed " The physical exam findings include some anemia. " The initial radiographic and laboratory data are hemoglobin 6.2.     DVT prophylaxis: SCD  Code Status: Full  Family Communication: Wife [name] at bedside  Disposition Plan: Home after blood transfusion. patient and wife want to get blood transfusion be discharged home the next next day.  And they will follow-up with your primary care provider and gastroenterologist as outpatient for further plan and  treatment    Consults called: GI Dr. Charlotte Crumb  Admission status: Observation patient and wife want to get blood transfusion be discharged home the next next day.  And they will follow-up with your primary care provider and gastroenterologist as outpatient for further plan and treatment    Cristal Deer MD [facility] Pager [phone]  If 7PM-7AM, please contact night-coverage [email or web address] Password [email or web address]  04/16/2018, 1:12 PM

## 2018-04-16 NOTE — Progress Notes (Addendum)
Hospice and Palliative Care [facility]) hospital liaison RN note.  Visited patient at bedside. Patient was sent to the hospital by Dr. Coral Spikes to receive a blood transfusion for a Hgb 6.5.  No additional treatments or procedures are desired at this time.  Patient states he feels weak otherwise, fine.   [facility] will and continue to follow while hospitalized and will follow up with patient after discharge. Please call with any hospice related questions or concerns.   Thank you,  Farrel Gordon, RN, [place] Hospital Liaison (listed on [place]) [phone]

## 2018-04-16 NOTE — ED Provider Notes (Addendum)
Medical screening examination/treatment/procedure(s) were conducted as a shared visit with non-physician practitioner(s) and myself.  I personally evaluated the patient during the encounter.  None  Patient seen by me along with physician assistant.  Patient is on hospice care due to severe CHF.  Patient referred in for low hemoglobin.  Hemoglobin here 6.5.  Patient also with melanotic stool.  Stool heme positive here.  Patient referred in for blood transfusion.  Patient will need at least 2 units and will need to be done slowly.  Patient best treated here with admission.  Also will notify his GI doctor from [facility].  Will arrange for hospitalist admission.  Patient's primary care doctor is Dr. Laurann Montana.  Patient hemodynamically stable here.  Patient with very poor renal function will bit worse than baseline.  Patient known that he had bad renal function.  This is the reason why the blood transfusions will have to be done very slowly.  Patient very pale in appearance mentating fine.  Blood transfusion initiated here in the emergency department.  CRITICAL CARE Performed by: Fredia Sorrow Total critical care time: 30 minutes Critical care time was exclusive of separately billable procedures and treating other patients. Critical care was necessary to treat or prevent imminent or life-threatening deterioration. Critical care was time spent personally by me on the following activities: development of treatment plan with patient and/or surrogate as well as nursing, discussions with consultants, evaluation of patient's response to treatment, examination of patient, obtaining history from patient or surrogate, ordering and performing treatments and interventions, ordering and review of laboratory studies, ordering and review of radiographic studies, pulse oximetry and re-evaluation of patient's condition.    Fredia Sorrow, MD 04/16/18 1151    Fredia Sorrow, MD 04/16/18 1152

## 2018-04-16 NOTE — Progress Notes (Signed)
[name] is a 77 y.o. male patient admitted from ED awake, alert - oriented  X 4 - no acute distress noted.  IV in place, occlusive dsg intact without redness.  Orientation to room, and floor completed with information packet given to patient/family.  Patient declined safety video at this time.  Admission INP armband ID verified with patient/family, and in place.   SR up x 2, fall assessment complete, with patient and family able to verbalize understanding of risk associated with falls, and verbalized understanding to call nsg before up out of bed.  Call light within reach, patient able to voice, and demonstrate understanding.  Skin, clean-dry- intact without evidence of bruising, or skin tears.   No evidence of skin break down noted on exam.     Will cont to eval and treat per MD orders.  Tama High, RN 04/16/2018 3:48 PM

## 2018-04-16 NOTE — ED Provider Notes (Signed)
[facility] EMERGENCY DEPARTMENT Provider Note   CSN: [id number] Arrival date & time: 04/16/18  0857     History   Chief Complaint No chief complaint on file.   HPI [name] is a 77 y.o. male.  77 year old male presents from home, wife at bedside, patient is in hospice care, recently admitted for sepsis with renal failure, kidney function improved with treatment of sepsis and patient did not require dialysis.  Patient went to his PCP yesterday for recent hospitalization follow-up, also reported having dark stools for the past few days.  Patient was contacted today by his PCP office and advised to come to the emergency room for transfusion, reports his hemoglobin was 6.2. Known history of anemia, wife states hgb normally around 8-10, has had prior transfusion, recent endoscopy that was unremarkable during 02/2018 admision, has not had a colonoscopy for several years. Has seen [facility] in the past. Denies abdominal pain, reports feeling cold and somewhat weak. Also hx a-fib, Eliquis and ASA stopped at last admission; hx of noninsulin dependent diabetes, with leg wounds under the care of wound care nurse with una boots to both legs. No other complaints or concerns.      Past Medical History:  Diagnosis Date  . Arthritis   . Atherosclerotic heart disease   . Back pain   . Bruises easily    takes Pletal daily and ASA 325mg  daily  . Cataract immature    right  . Chest pain   . Colon polyps   . Complication of anesthesia    hard to wake up with bypass surgery  . Cyst    on perineum;takes Doxycycline daily  . Diabetes mellitus    takes Glipizide,Metformin,and Actos daily  . Diarrhea   . Dyspnea    when lying flat  . Edema    right leg knee down swollen since fall 3 wks ago  . Foot drop, left   . GERD (gastroesophageal reflux disease)    Rolaids as needed-occ reflux  . Hemorrhoids   . History of kidney stones    at age 31   . Hyperlipidemia    takes Tricor and Lipitor daily  . Hypertension    takes Altace,Amlodipine,and Nadolol daily  . Hypothyroidism   . Kidney stone    35+yrs ago  . Muscle pain   . Nasal polyps    hx of  . Peripheral edema    wears knee length hose-told by podiatrist to wear these  . Peripheral neuropathy    takes Gabapentin daily  . Pneumonia    as a child  . PVD (peripheral vascular disease) ([place])   . Renal insufficiency   . Seasonal allergies    takes Mucinex and Zyrtec prn  . Urinary frequency    urgency/takes Vesicare daily    Patient Active Problem List   Diagnosis Date Noted  . Acute blood loss anemia 04/16/2018  . Acute on chronic anemia 04/16/2018  . Weakness generalized   . DNR (do not resuscitate) discussion   . Palliative care by specialist   . Transient alteration of awareness   . AKI (acute kidney injury) ([place])   . Acute on chronic congestive heart failure ([place]) 03/18/2018  . Symptomatic anemia 03/08/2018  . Atrial fibrillation ([place]) 03/08/2018  . Hyperlipidemia 03/08/2018  . Chronic back pain 03/08/2018  . Combined systolic and diastolic heart failure ([place]) 03/08/2018  . GI bleed 03/07/2018  . Coronary artery disease involving coronary bypass graft of native  heart without angina pectoris   . Benign essential HTN   . PAD (peripheral artery disease) ([place])   . New onset atrial fibrillation ([place])   . Tachypnea   . Hyponatremia   . Malnutrition of moderate degree 02/27/2018  . Volume overload 11/10/2017  . Surgical wound infection 11/04/2017  . Polyneuropathy associated with underlying disease ([place]) 08/16/2017  . Essential hypertension 02/06/2014  . Acute on chronic combined systolic and diastolic CHF (congestive heart failure) ([place]) 02/06/2014  . Chronic kidney disease, stage III (moderate) ([place]) 02/06/2014  . Aftercare following surgery of the circulatory system, [place] 01/25/2014  . Carotid stenosis 06/08/2013  . Stricture of artery ([place]) 04/14/2012  . Peripheral vascular  disease, unspecified ([place]) 10/08/2011  . DVT of upper extremity (deep vein thrombosis), left 09/29/2011  . S/P CABG (coronary artery bypass graft) 09/29/2011  . H/O carotid endarterectomy 09/29/2011  . Occlusion and stenosis of carotid artery without mention of cerebral infarction 06/11/2011  . Diabetes mellitus type 2 with complications ([place]) 19/50/9326    Past Surgical History:  Procedure Laterality Date  . Aortobifemoral bypass    . APPLICATION OF WOUND VAC Left 10/25/2017   Procedure: Wound vac placement to left groin.;  Surgeon: Elam Dutch, MD;  Location: [facility] OR;  Service: Vascular;  Laterality: Left;  . APPLICATION OF WOUND VAC Left 11/10/2017   Procedure: APPLICATION OF WOUND VAC;  Surgeon: Elam Dutch, MD;  Location: [place];  Service: Vascular;  Laterality: Left;  . CARDIAC CATHETERIZATION  most recent 05/2011   total of 4  . CARDIOVERSION N/A 03/22/2018   Procedure: CARDIOVERSION;  Surgeon: Jerline Pain, MD;  Location: [facility] ENDOSCOPY;  Service: Cardiovascular;  Laterality: N/A;  . CAROTID ANGIOGRAM N/A 06/05/2011   Procedure: CAROTID Cyril Loosen;  Surgeon: Elam Dutch, MD;  Location: [facility] CATH LAB;  Service: Cardiovascular;  Laterality: N/A;  . CAROTID ENDARTERECTOMY  04/08/2011   left CEA  . cataract surgery Bilateral    left  . COLONOSCOPY    . COLONOSCOPY WITH PROPOFOL N/A 08/22/2013   Procedure: COLONOSCOPY WITH PROPOFOL;  Surgeon: Garlan Fair, MD;  Location: [facility] ENDOSCOPY;  Service: Endoscopy;  Laterality: N/A;  . CORONARY ARTERY BYPASS GRAFT  09/09/2011   Procedure: CORONARY ARTERY BYPASS GRAFTING (CABG);  Surgeon: Gaye Pollack, MD;  Location: [place];  Service: Open Heart Surgery;  Laterality: N/A;  Coronary artery bypass grafting  x three with Right saphenous vein harvested endoscopically and left internal mammary artery  . ENDARTERECTOMY  04/08/2011   Procedure: ENDARTERECTOMY CAROTID;  Surgeon: Elam Dutch, MD;  Location: [facility] OR;  Service: Vascular;   Laterality: Left;  with patch angioplasty  . ENDARTERECTOMY  09/09/2011   Procedure: ENDARTERECTOMY CAROTID;  Surgeon: Elam Dutch, MD;  Location[facility] OR;  Service: Vascular;  Laterality: Right;  with patch angioplasty  . ESOPHAGOGASTRODUODENOSCOPY (EGD) WITH PROPOFOL N/A 03/10/2018   Procedure: ESOPHAGOGASTRODUODENOSCOPY (EGD) WITH PROPOFOL;  Surgeon: Arta Silence, MD;  Location: [place];  Service: Endoscopy;  Laterality: N/A;  . FEMORAL-POPLITEAL BYPASS GRAFT Left 10/25/2017   Procedure: Left Common Femoral Endarterectomy and perfundaplasty, Left BYPASS GRAFT FEMORAL-BELOW KNEE POPLITEAL ARTERY.;  Surgeon: Elam Dutch, MD;  Location: [place];  Service: Vascular;  Laterality: Left;  . GROIN DEBRIDEMENT Left 11/10/2017   Procedure: GROIN DEBRIDEMENT;  Surgeon: Elam Dutch, MD;  Location: [place];  Service: Vascular;  Laterality: Left;  . LEFT HEART CATHETERIZATION WITH CORONARY ANGIOGRAM N/A 07/09/2011   Procedure: LEFT HEART CATHETERIZATION WITH  CORONARY ANGIOGRAM;  Surgeon: Sinclair Grooms, MD;  Location: [facility] CATH LAB;  Service: Cardiovascular;  Laterality: N/A;  . LOWER EXTREMITY ANGIOGRAPHY N/A 10/11/2017   Procedure: LOWER EXTREMITY ANGIOGRAPHY;  Surgeon: Waynetta Sandy, MD;  Location: [place] CV LAB;  Service: Cardiovascular;  Laterality: N/A;  . Reimplantation of inferior mesenteric artery    . Repair of infrarenal abdominal aortic aneurysm    . RIGHT HEART CATH N/A 04/04/2018   Procedure: RIGHT HEART CATH;  Surgeon: Larey Dresser, MD;  Location: [place] CV LAB;  Service: Cardiovascular;  Laterality: N/A;  . SHOULDER ARTHROSCOPY W/ ROTATOR CUFF REPAIR     right and left-open procedures  . TEE WITHOUT CARDIOVERSION N/A 03/22/2018   Procedure: TRANSESOPHAGEAL ECHOCARDIOGRAM (TEE);  Surgeon: Jerline Pain, MD;  Location: [facility] ENDOSCOPY;  Service: Cardiovascular;  Laterality: N/A;  . TONSILLECTOMY     at age 55  . TRIGGER FINGER RELEASE Right 06/28/2014    Procedure: RIGHT LONG TRIGGER RELEASE ;  Surgeon: Leanora Cover, MD;  Location: [place];  Service: Orthopedics;  Laterality: Right;  . wisdom teeth extracted     as a teenager  . WOUND DEBRIDEMENT Left 11/05/2017   Procedure: DEBRIDEMENT WOUND LEFT GROIN with wound vac placement;  Surgeon: Elam Dutch, MD;  Location: [facility] OR;  Service: Vascular;  Laterality: Left;        Home Medications    Prior to Admission medications   Medication Sig Start Date End Date Taking? Authorizing Provider  acetaminophen (TYLENOL) 500 MG tablet Take 1,000 mg by mouth every 6 (six) hours as needed for headache (pain).    [provider]  amiodarone (PACERONE) 200 MG tablet Take 1 tablet (200 mg total) by mouth 2 (two) times daily. 03/17/18   Isaiah Serge, NP  apixaban (ELIQUIS) 5 MG TABS tablet Take 1 tablet (5 mg total) by mouth 2 (two) times daily. Patient taking differently: Take 5 mg by mouth 2 (two) times daily after a meal.  03/03/18 04/02/18  Arrien, Jimmy Picket, MD  Ascorbic Acid (VITAMIN C PO) Take 1 tablet by mouth 2 (two) times daily.    [provider]  atorvastatin (LIPITOR) 20 MG tablet Take 20 mg by mouth daily after supper.     [provider]  carvedilol (COREG) 3.125 MG tablet Take 1 tablet (3.125 mg total) by mouth 2 (two) times daily. 04/06/18   Purohit, Konrad Dolores, MD  fenofibrate 54 MG tablet Take 54 mg by mouth daily.    [provider]  gabapentin (NEURONTIN) 300 MG capsule Take 300 mg by mouth 2 (two) times daily after a meal.     [provider]  hydrALAZINE (APRESOLINE) 25 MG tablet Take 1 tablet (25 mg total) by mouth every 8 (eight) hours. 04/06/18 05/06/18  Purohit, Konrad Dolores, MD  isosorbide mononitrate (IMDUR) 30 MG 24 hr tablet Take 1 tablet (30 mg total) by mouth daily. 04/07/18 05/07/18  Purohit, Konrad Dolores, MD  levothyroxine (SYNTHROID, LEVOTHROID) 100 MCG tablet Take 100 mcg by mouth daily before breakfast.  01/07/18    [provider]  methylcellulose (ARTIFICIAL TEARS) 1 % ophthalmic solution Place 1 drop into both eyes 2 (two) times daily as needed (dry eyes).    [provider]  Multiple Vitamin (MULTIVITAMIN WITH MINERALS) TABS tablet Take 1 tablet by mouth daily. 12/03/17   Rhyne, Hulen Shouts, PA-C  nitroGLYCERIN (NITROSTAT) 0.4 MG SL tablet PLACE 1 TABLET UNDER TONGUE EVERY 5 MINUTES  AS NEEDED FOR CHEST PAIN. Patient taking differently: Place 0.4 mg under the tongue every 5 (five) minutes as needed for chest pain.  09/17/15   Belva Crome, MD  oxymetazoline (VICKS SINEX) 0.05 % nasal spray Place 1 spray into both nostrils 2 (two) times daily as needed for congestion.    [provider]  pantoprazole (PROTONIX) 40 MG tablet Take 1 tablet (40 mg total) by mouth daily. 03/11/18 03/11/19  Pokhrel, Corrie Mckusick, MD  potassium chloride SA (K-DUR,KLOR-CON) 20 MEQ tablet Take by mouth as directed. Patient taking differently: Take 20 mEq by mouth See admin instructions. Take one tablet (20 meq) by mouth when directed to do so by MD 03/18/18   Isaiah Serge, NP  pseudoephedrine-guaifenesin [facility] D) 60-600 MG per tablet Take 1 tablet by mouth every 12 (twelve) hours as needed for congestion.     [provider]  sodium chloride (OCEAN) 0.65 % SOLN nasal spray Place 1 spray into both nostrils 2 (two) times daily as needed for congestion.    [provider]  tamsulosin (FLOMAX) 0.4 MG CAPS capsule Take 0.4 mg by mouth every Monday, Wednesday, and Friday.     [provider]  torsemide (DEMADEX) 20 MG tablet Take 4 tablets (80 mg total) by mouth daily with breakfast. Patient not taking: Reported on 04/08/2018 04/07/18 05/07/18  Purohit, Konrad Dolores, MD  torsemide (DEMADEX) 20 MG tablet Take 60-80 mg by mouth See admin instructions. Take 80mg  in the AM and 60mg  in the PM.    [provider]  vitamin B-12 (CYANOCOBALAMIN) 500 MCG tablet Take 500 mcg by mouth daily.     [provider]  zinc gluconate 50 MG tablet Take 50 mg by mouth daily.    [provider]    Family History Family History  Problem Relation Age of Onset  . Heart disease Mother   . Heart disease Father   . Anesthesia problems Neg Hx   . Hypotension Neg Hx   . Malignant hyperthermia Neg Hx   . Pseudochol deficiency Neg Hx     Social History Social History   Tobacco Use  . Smoking status: Former Smoker    Years: 40.00    Types: Cigarettes    Last attempt to quit: 10/27/2005    Years since quitting: 12.4  . Smokeless tobacco: Never Used  Substance Use Topics  . Alcohol use: Yes    Alcohol/week: 2.0 - 3.0 standard drinks    Types: 2 - 3 Cans of beer per week    Comment: occasional- weekly  . Drug use: No     Allergies   Amoxicillin-pot clavulanate; Morphine and related; Percocet [oxycodone-acetaminophen]; and Restoril [temazepam]   Review of Systems Review of Systems  Constitutional: Negative for fever.  Respiratory: Negative for shortness of breath.   Cardiovascular: Negative for chest pain.  Gastrointestinal: Positive for blood in stool. Negative for abdominal pain, constipation, diarrhea, nausea and vomiting.  Genitourinary: Negative for difficulty urinating.  Skin: Positive for wound.  Allergic/Immunologic: Positive for immunocompromised state.  Neurological: Positive for weakness.  Psychiatric/Behavioral: Negative for confusion.  All other systems reviewed and are negative.    Physical Exam Updated Vital Signs BP (!) 148/132   Pulse 64   Temp 97.9 F (36.6 C) (Oral)   Resp (!) 28   SpO2 100%   Physical Exam  Constitutional: He is oriented to person, place, and time. He appears well-developed and well-nourished. No distress.  HENT:  Head: Normocephalic and atraumatic.  Cardiovascular: Normal rate, regular rhythm and normal heart sounds.  No murmur heard. Pulmonary/Chest: Effort normal and breath sounds normal. No respiratory  distress.  Abdominal: Soft. There is tenderness in the left lower quadrant.  Mild tenderness deep palpation LLQ  Genitourinary: Rectal exam shows guaiac positive stool. Rectal exam shows anal tone normal.  Neurological: He is alert and oriented to person, place, and time.  Skin: Skin is warm and dry. He is not diaphoretic. There is pallor.  Psychiatric: He has a normal mood and affect. His behavior is normal.  Nursing note and vitals reviewed.    ED Treatments / Results  Labs (all labs ordered are listed, but only abnormal results are displayed) Labs Reviewed  COMPREHENSIVE METABOLIC PANEL - Abnormal; Notable for the following components:      Result Value   Potassium 3.3 (*)    Chloride 97 (*)    Glucose, Bld 194 (*)    BUN 48 (*)    Creatinine, Ser 2.12 (*)    Calcium 8.7 (*)    Total Protein 6.3 (*)    Albumin 2.9 (*)    GFR calc non Af Amer 28 (*)    GFR calc Af Amer 33 (*)    All other components within normal limits  CBC WITH DIFFERENTIAL/PLATELET - Abnormal; Notable for the following components:   RBC 2.42 (*)    Hemoglobin 6.5 (*)    HCT 21.9 (*)    MCHC 29.7 (*)    RDW 17.7 (*)    All other components within normal limits  POC OCCULT BLOOD, ED - Abnormal; Notable for the following components:   Fecal Occult Bld POSITIVE (*)    All other components within normal limits  TYPE AND SCREEN  PREPARE RBC (CROSSMATCH)    EKG None  Radiology No results found.  Procedures .Critical Care Performed by: Tacy Learn, PA-C Authorized by: Tacy Learn, PA-C   Critical care provider statement:    Critical care time (minutes):  45   Critical care was necessary to treat or prevent imminent or life-threatening deterioration of the following conditions:  Circulatory failure   Critical care was time spent personally by me on the following activities:  Discussions with consultants, evaluation of patient's response to treatment, examination of patient, ordering and  performing treatments and interventions, ordering and review of laboratory studies, pulse oximetry, re-evaluation of patient's condition, obtaining history from patient or surrogate, review of old charts and development of treatment plan with patient or surrogate   I assumed direction of critical care for this patient from another provider in my specialty: no     (including critical care time)  Medications Ordered in ED Medications  0.9 %  sodium chloride infusion (10 mL/hr Intravenous New Bag/Given 04/16/18 1115)     Initial Impression / Assessment and Plan / ED Course  I have reviewed the triage vital signs and the nursing notes.  Pertinent labs & imaging results that were available during my care of the patient were reviewed by me and considered in my medical decision making (see chart for details).  Clinical Course as of Apr 16 1320  Sat Apr 16, 2018  1202 77yo male presents from home for transfusion. Patient seen at PCP office yesterday for follow up recent hospitalization and with report of dark stools, history of GI bleed. Hgb found to be 6.2 and patient was called today and advised to come to the ER for transfusion. Patient appears pale, generally weak/fatigued. Patient is  in hospice care for CHF. Records of recent admissions reviewed, patient admitted 02/2018 for GI bleed, had upper endoscopy without source of bleeding found, colonoscopy not done. Patient with slight LLQ tenderness on exam, hemoccult positive, stool black, vitals stable, hgb 6.5, Cr increase to 2.12. Results discussed with patient and wife, discussed GI bleed with anemia may need further work up if desired/if patient is a candidate for colonoscopy etc. Patient would like to discuss further work-up with his gastroenterologist to try to identify the source of his GI bleed and prevent need for future transfusions.  Patient's wife does not seem on board with this plan as patient is in hospice her focus is on comfort care at  this point.  Case discussed with Dr. Rogene Houston, ER attending who has seen the patient, also discussed with comfort care nurse.  Due to patient's kidney function, recommend admission to the hospital as it will take several hours for him to receive a transfusion today.  Discussed risks of transfusion as well as possible need for further transfusions.  Ultimately, patient would like to be home for Thanksgiving, he is agreeable to admission to the hospital at this time and further work-up if necessary after discussion with his gastroenterologist. Hospitalist and GI paged for consult.    [LM]  1218 Case discussed with [facility] who will admit, pending consult with GI.   [LM]  1320 Case discussed with Dr. Cristina Gong with GI who will consult with plan to see patient tonight.   [LM]    Clinical Course User Index [LM] Tacy Learn, PA-C   Final Clinical Impressions(s) / ED Diagnoses   Final diagnoses:  Anemia due to other cause, not classified  Gastrointestinal hemorrhage, unspecified gastrointestinal hemorrhage type  AKI (acute kidney injury) [facility])    ED Discharge Orders    None       Tacy Learn, PA-C 04/16/18 1321    Fredia Sorrow, MD 04/17/18 0725

## 2018-04-16 NOTE — ED Triage Notes (Signed)
Patient arrived to the ED from home with wife because he states that his PCP told him that his hemoglobin is 6.2. Patient's wife at bedside

## 2018-04-17 ENCOUNTER — Observation Stay (HOSPITAL_COMMUNITY)

## 2018-04-17 DIAGNOSIS — D62 Acute posthemorrhagic anemia: Secondary | ICD-10-CM | POA: Diagnosis not present

## 2018-04-17 LAB — BASIC METABOLIC PANEL
Anion gap: 9 (ref 5–15)
BUN: 44 mg/dL — AB (ref 8–23)
CALCIUM: 8.8 mg/dL — AB (ref 8.9–10.3)
CO2: 28 mmol/L (ref 22–32)
CREATININE: 2.24 mg/dL — AB (ref 0.61–1.24)
Chloride: 100 mmol/L (ref 98–111)
GFR, EST AFRICAN AMERICAN: 31 mL/min — AB (ref >60)
GFR, EST NON AFRICAN AMERICAN: 26 mL/min — AB (ref >60)
Glucose, Bld: 165 mg/dL — ABNORMAL HIGH (ref 70–99)
Potassium: 3.6 mmol/L (ref 3.5–5.1)
SODIUM: 137 mmol/L (ref 135–145)

## 2018-04-17 LAB — TYPE AND SCREEN
ABO/RH(D): A POS
Antibody Screen: NEGATIVE
UNIT DIVISION: 0
UNIT DIVISION: 0

## 2018-04-17 LAB — CBC
HCT: 27.4 % — ABNORMAL LOW (ref 39.0–52.0)
HEMOGLOBIN: 8.3 g/dL — AB (ref 13.0–17.0)
MCH: 26.3 pg (ref 26.0–34.0)
MCHC: 30.3 g/dL (ref 30.0–36.0)
MCV: 86.7 fL (ref 80.0–100.0)
PLATELETS: 297 10*3/uL (ref 150–400)
RBC: 3.16 MIL/uL — ABNORMAL LOW (ref 4.22–5.81)
RDW: 17.2 % — AB (ref 11.5–15.5)
WBC: 5.1 10*3/uL (ref 4.0–10.5)
nRBC: 0 % (ref 0.0–0.2)

## 2018-04-17 LAB — BPAM RBC
Blood Product Expiration Date: 201912162359
Blood Product Expiration Date: 201912162359
ISSUE DATE / TIME: 201911231105
ISSUE DATE / TIME: 201911231413
UNIT TYPE AND RH: 6200
Unit Type and Rh: 6200

## 2018-04-17 LAB — BRAIN NATRIURETIC PEPTIDE: B NATRIURETIC PEPTIDE 5: 2522.7 pg/mL — AB (ref 0.0–100.0)

## 2018-04-17 MED ORDER — FOLIC ACID 1 MG PO TABS: 1.0000 mg | ORAL_TABLET | Freq: Every day | ORAL | 0 refills | Status: DC

## 2018-04-17 MED ORDER — METOLAZONE 2.5 MG PO TABS
2.5000 mg | ORAL_TABLET | Freq: Once | ORAL | Status: DC
  Filled 2018-04-17: qty 1

## 2018-04-17 MED ORDER — SPIRONOLACTONE 25 MG PO TABS
50.0000 mg | ORAL_TABLET | Freq: Once | ORAL | Status: AC
  Administered 2018-04-17: 50 mg via ORAL
  Filled 2018-04-17: qty 2

## 2018-04-17 MED ORDER — POTASSIUM CHLORIDE CRYS ER 20 MEQ PO TBCR
40.0000 meq | EXTENDED_RELEASE_TABLET | Freq: Once | ORAL | Status: AC
  Administered 2018-04-17: 40 meq via ORAL
  Filled 2018-04-17: qty 2

## 2018-04-17 MED ORDER — FERROUS SULFATE 325 (65 FE) MG PO TABS: 325.0000 mg | ORAL_TABLET | Freq: Two times a day (BID) | ORAL | 0 refills | Status: AC

## 2018-04-17 MED ORDER — METOLAZONE 5 MG PO TABS
5.0000 mg | ORAL_TABLET | Freq: Once | ORAL | Status: AC
  Administered 2018-04-17: 5 mg via ORAL
  Filled 2018-04-17: qty 1

## 2018-04-17 MED ORDER — HYDROCODONE-ACETAMINOPHEN 5-325 MG PO TABS
1.0000 | ORAL_TABLET | Freq: Once | ORAL | Status: AC
  Administered 2018-04-17: 1 via ORAL
  Filled 2018-04-17: qty 1

## 2018-04-17 NOTE — Discharge Summary (Addendum)
[name] DDU:[id number] DOB: 05-31-1940 DOA: 04/16/2018  PCP: Lavone Orn, MD  Admit date: 04/16/2018  Discharge date: 04/17/2018  Admitted From: Home  Disposition:  Home   Recommendations for Outpatient Follow-up:   Follow up with PCP in 1-2 weeks  PCP Please obtain BMP/CBC, 2 view CXR in 1week,  (see Discharge instructions)   PCP Please follow up on the following pending results:    Home Health: None   Equipment/Devices: None  Consultations: GI Discharge Condition: Fair   CODE STATUS: Full   Diet Recommendation: Heart Healthy low carbohydrate with strict 1.5 L/day total fluid restriction.    CC - Melena   Brief history of present illness from the day of admission and additional interim summary    [name] is a 77 y.o. male with medical history significant for atrial fibrillation on Eliquis and aspirin which was stopped on his last admission of October 2019, chronic anemia which usually runs between 8 and 10 and he has had transfusion in the past and GI bleed.  He is a hospice patient for end-stage heart failure, past medical history of congestive heart failure, atherosclerotic heart disease, hypertension hyperlipidemia, type 2 diabetes mellitus, has a wound in the leg under the care of a wound care nurse has bilateral Unna boots on both legs who presented from home because his primary care provider called to inform him that his hemoglobin was low was 6.2 and advised him to go to the ER for blood transfusion                                                                 Hospital Course   1.  GI bleed causing acute blood loss related anemia, baseline hospice patient due to advanced CHF, recent EGD unremarkable, colonoscopy 4 years ago unremarkable.  Patient received 2 units of packed RBCs here  with stable H&H, he was recently on aspirin and Eliquis which have been discontinued in the outpatient setting and will continue to hold those, per GI not a good candidate for repeat colonoscopy or capsule endoscopy as it would not change management much to drink for his advanced CHF and a slight home hospice status.  Patient currently is adamant to be discharged home after he has received transfusions, will continue PPI and discharged home with PCP follow-up.  Goal of care should be comfort.  He is home hospice.  We will give him oral iron and folic acid supplementation upon discharge.  2.  Anemia due to chronic blood loss hemoglobin is 6.5 patient has started receiving -the above.  3.    ARF on CKD 4.  Baseline creatinine around 1.8.  ARF due to anemia, has been transfused, currently has evidence of fluid overload, start diuresis.  Again goal of care is comfort.  4.  Hypokalemia-replaced.  Stable.  5.    History of end-stage chronic systolic CHF with EF 48%.  Under the care of Dr. Daneen Schick, home hospice at baseline, currently has evidence of acute on chronic decompensation with fluid overload, home dose Demadex given, offered him to stay in the hospital for diuresis but patient refuses.  Will give him extra dose Zaroxolyn 5 mg and Aldactone 50 mg, placed on fluid restriction upon discharge and discharge home.  He is currently on 2 L nasal cannula home oxygen which will be continued.  Note patient is adamant to be discharged.  On Home hospice for advanced CHF.       Discharge diagnosis     Principal Problem:   Acute blood loss anemia Active Problems:   Diabetes mellitus type 2 with complications (HCC)   S/P CABG (coronary artery bypass graft)   Acute on chronic combined systolic and diastolic CHF (congestive heart failure) (HCC)   Chronic kidney disease, stage III (moderate) (HCC)   Malnutrition of moderate degree   Benign essential HTN   GI bleed   Hyperlipidemia   Chronic back  pain   Combined systolic and diastolic heart failure (HCC)   Acute on chronic anemia    Discharge instructions    Discharge Instructions    Diet - low sodium heart healthy   Complete by:  As directed    Discharge instructions   Complete by:  As directed    Follow with Primary MD Lavone Orn, MD in 7 days   Get CBC, CMP checked  by Primary MD or SNF MD in 5-7 days    Activity: As tolerated with Full fall precautions use walker/cane & assistance as needed  Disposition Home   Diet: Heart Healthy    Special Instructions: If you have smoked or chewed Tobacco  in the last 2 yrs please stop smoking, stop any regular Alcohol  and or any Recreational drug use.  On your next visit with your primary care physician please Get Medicines reviewed and adjusted.  Please request your Prim.MD to go over all Hospital Tests and Procedure/Radiological results at the follow up, please get all Hospital records sent to your Prim MD by signing hospital release before you go home.  If you experience worsening of your admission symptoms, develop shortness of breath, life threatening emergency, suicidal or homicidal thoughts you must seek medical attention immediately by calling 911 or calling your MD immediately  if symptoms less severe.  You Must read complete instructions/literature along with all the possible adverse reactions/side effects for all the Medicines you take and that have been prescribed to you. Take any new Medicines after you have completely understood and accpet all the possible adverse reactions/side effects.   Increase activity slowly   Complete by:  As directed       Discharge Medications   Allergies as of 04/17/2018      Reactions   Amoxicillin-pot Clavulanate Nausea Only   Has patient had a PCN reaction causing immediate rash, facial/tongue/throat swelling, SOB or lightheadedness with hypotension: No Has patient had a PCN reaction causing severe rash involving mucus membranes  or skin necrosis: No Has patient had a PCN reaction that required hospitalization: No Has patient had a PCN reaction occurring within the last 10 years: No If all of the above answers are "NO", then may proceed with Cephalosporin use.   Morphine And Related Other (See Comments)   hallucinations   Percocet [oxycodone-acetaminophen] Other (See Comments)  Agitation and Increased Confusion. Family asks to please not order this medication for him.    Restoril [temazepam] Other (See Comments)   Confusion and Agitation. Has taken in past, but has not reacted well. Family requests to not give this mediation.       Medication List    STOP taking these medications   apixaban 5 MG Tabs tablet Commonly known as:  ELIQUIS     TAKE these medications   acetaminophen 500 MG tablet Commonly known as:  TYLENOL Take 1,000 mg by mouth every 6 (six) hours as needed for headache (pain).   amiodarone 200 MG tablet Commonly known as:  PACERONE Take 1 tablet (200 mg total) by mouth 2 (two) times daily.   atorvastatin 20 MG tablet Commonly known as:  LIPITOR Take 20 mg by mouth daily after supper.   carvedilol 3.125 MG tablet Commonly known as:  COREG Take 1 tablet (3.125 mg total) by mouth 2 (two) times daily.   fenofibrate 54 MG tablet Take 54 mg by mouth every morning.   ferrous sulfate 325 (65 FE) MG tablet Take 1 tablet (325 mg total) by mouth 2 (two) times daily with a meal.   folic acid 1 MG tablet Commonly known as:  FOLVITE Take 1 tablet (1 mg total) by mouth daily.   gabapentin 300 MG capsule Commonly known as:  NEURONTIN Take 300 mg by mouth 2 (two) times daily after a meal.   hydrALAZINE 25 MG tablet Commonly known as:  APRESOLINE Take 1 tablet (25 mg total) by mouth every 8 (eight) hours.   isosorbide mononitrate 30 MG 24 hr tablet Commonly known as:  IMDUR Take 1 tablet (30 mg total) by mouth daily. What changed:  when to take this   levothyroxine 100 MCG  tablet Commonly known as:  SYNTHROID, LEVOTHROID Take 100 mcg by mouth daily before breakfast.   LORazepam 0.5 MG tablet Commonly known as:  ATIVAN Take 0.5 mg by mouth at bedtime as needed for anxiety.   methylcellulose 1 % ophthalmic solution Commonly known as:  ARTIFICIAL TEARS Place 1 drop into both eyes 2 (two) times daily as needed (dry eyes).   multivitamin with minerals Tabs tablet Take 1 tablet by mouth daily. What changed:  when to take this   nitroGLYCERIN 0.4 MG SL tablet Commonly known as:  NITROSTAT PLACE 1 TABLET UNDER TONGUE EVERY 5 MINUTES AS NEEDED FOR CHEST PAIN. What changed:  See the new instructions.   pantoprazole 40 MG tablet Commonly known as:  PROTONIX Take 1 tablet (40 mg total) by mouth daily. What changed:    when to take this  additional instructions   potassium chloride SA 20 MEQ tablet Commonly known as:  K-DUR,KLOR-CON Take by mouth as directed. What changed:    how much to take  how to take this  when to take this  additional instructions   pseudoephedrine-guaifenesin 60-600 MG 12 hr tablet Commonly known as:  MUCINEX D Take 1 tablet by mouth every 12 (twelve) hours as needed for congestion.   tamsulosin 0.4 MG Caps capsule Commonly known as:  FLOMAX Take 0.4 mg by mouth every Monday, Wednesday, and Friday.   torsemide 20 MG tablet Commonly known as:  DEMADEX Take 60-80 mg by mouth See admin instructions. Take 80mg  in the AM and 60mg  in the PM. What changed:  Another medication with the same name was changed. Make sure you understand how and when to take each.   torsemide 20 MG tablet  Commonly known as:  DEMADEX Take 4 tablets (80 mg total) by mouth daily with breakfast. What changed:    how much to take  when to take this  additional instructions   vitamin B-12 500 MCG tablet Commonly known as:  CYANOCOBALAMIN Take 500 mcg by mouth every morning.   VITAMIN C PO Take 1 tablet by mouth 2 (two) times daily.    zinc gluconate 50 MG tablet Take 50 mg by mouth every morning.       Follow-up Information    Lavone Orn, MD. Schedule an appointment as soon as possible for a visit in 1 week(s).   Specialty:  Internal Medicine Contact information: [address], [address] [place] [address] [phone]        Belva Crome, MD .   Specialty:  Cardiology Contact information: [phone] [place] [address] [phone]           Major procedures and Radiology Reports - PLEASE review detailed and final reports thoroughly  -         Dg Chest 2 View  Result Date: 03/18/2018 CLINICAL DATA:  77 year old male with a history of generalized weakness for 2 days EXAM: CHEST - 2 VIEW COMPARISON:  02/28/2018 FINDINGS: Cardiomediastinal silhouette unchanged with cardiomegaly and atherosclerosis of the aorta. Surgical changes of median sternotomy. Interlobular septal thickening with fullness of the vasculature. Blunting at the costophrenic angle and cardiophrenic angle. Minimal meniscus on the lateral view. No displaced fracture. IMPRESSION: Early CHF with small right pleural effusion. Surgical changes of median sternotomy Electronically Signed   By: Corrie Mckusick D.O.   On: 03/18/2018 13:40   Dg Chest Port 1 View  Result Date: 04/17/2018 CLINICAL DATA:  Acute shortness of breath EXAM: PORTABLE CHEST 1 VIEW COMPARISON:  03/18/2018 and prior exams FINDINGS: Cardiomegaly and CABG changes again noted. Pulmonary vascular congestion and mild interstitial opacities are present. Trace bilateral pleural effusions are noted. No pneumothorax or acute bony abnormality. IMPRESSION: Cardiomegaly with pulmonary vascular congestion and probable mild interstitial pulmonary edema. Trace bilateral pleural effusions. Electronically Signed   By: Margarette Canada M.D.   On: 04/17/2018 07:26    Micro Results     No results found for this or any previous visit (from the past 240  hour(s)).  Today   Subjective    [name] today has no headache,no chest abdominal pain,no new weakness tingling or numbness, feels much better wants to go home today.     Objective   Blood pressure (!) 153/81, pulse 61, temperature (!) 97.4 F (36.3 C), temperature source Oral, resp. rate 20, SpO2 96 %.   Intake/Output Summary (Last 24 hours) at 04/17/2018 1234 Last data filed at 04/17/2018 0646 Gross per 24 hour  Intake 689 ml  Output 825 ml  Net -136 ml    Exam Awake Alert, Oriented x 3, No new F.N deficits, Normal affect [place].AT,PERRAL Supple Neck,No JVD, No cervical lymphadenopathy appriciated.  Symmetrical Chest wall movement, Good air movement bilaterally, CTAB RRR,No Gallops,Rubs or new Murmurs, No Parasternal Heave +ve B.Sounds, Abd Soft, Non tender, No organomegaly appriciated, No rebound -guarding or rigidity. No Cyanosis, Clubbing or edema, No new Rash or bruise   Data Review   CBC w Diff:  Lab Results  Component Value Date   WBC 5.1 04/17/2018   HGB 8.3 (L) 04/17/2018   HGB 10.7 (L) 02/03/2018   HCT 27.4 (L) 04/17/2018   HCT 32.5 (L) 02/03/2018   PLT  297 04/17/2018   PLT 278 02/03/2018   LYMPHOPCT 16 04/16/2018   MONOPCT 12 04/16/2018   EOSPCT 6 04/16/2018   BASOPCT 1 04/16/2018    CMP:  Lab Results  Component Value Date   NA 137 04/17/2018   NA 132 (L) 03/17/2018   K 3.6 04/17/2018   CL 100 04/17/2018   CO2 28 04/17/2018   BUN 44 (H) 04/17/2018   BUN 67 (H) 03/17/2018   CREATININE 2.24 (H) 04/17/2018   CREATININE 1.32 04/18/2012   PROT 6.3 (L) 04/16/2018   PROT 6.3 03/17/2018   ALBUMIN 2.9 (L) 04/16/2018   ALBUMIN 3.4 (L) 03/17/2018   BILITOT 0.8 04/16/2018   BILITOT 0.7 03/17/2018   ALKPHOS 47 04/16/2018   AST 32 04/16/2018   ALT 22 04/16/2018  .   Total Time in preparing paper work, data evaluation and todays exam - 57 minutes  Lala Lund M.D on 04/17/2018 at 12:34 PM  [facility]   Office  [phone]

## 2018-04-17 NOTE — Progress Notes (Signed)
[name] to be D/C'd home per MD order. Discussed with the patient, wife, and daughter, and all questions fully answered. VVS, Skin clean, dry and intact without evidence of skin break down.  IV catheter discontinued intact. Site without signs and symptoms of complications. Dressing and pressure applied.  An After Visit Summary and prescriptions was printed and given to the patient.  Patient escorted via [place], and D/C home via private auto.  [name]  04/17/2018 12:44 PM

## 2018-04-17 NOTE — Progress Notes (Signed)
[facility]) hospital liaison GIP RN visit.  This is a related and covered GIP admission of 04/16/2018 with [facility] diagnosis of CHF per Dr. Gildardo Cranker. Patient is a full code. Patient was advised to go to the ED by his PCP, Dr. Laurann Montana for a blood transfusion for a Hgb of 6.5. Patient was admitted for this procedure. Patient's daughter notified [facility].   Day 2 of [facility] GIP  Visited with patient at bedside. His wife was also present. Patient is alert and oriented, sitting up in recliner chair. He states he is ready to go home.  He is on 2Lnc with SpO2 of 96%, VVS.   Medications:  2 units of PRBC Tylenol 1000mg  @ 1025  Per MD notes: Gastroenterology Impression: Acute on chronic anemia with heme positive stool, having been on full anticoagulation with Eliquis until recently. Plan: I do not feel that there is a role for GI evaluation in this patient. This would include repeat upper endoscopy, updating his colonoscopy, or doing a small bowel video capsule endoscopy.   First of all, it would be moderately high risk.  More importantly, the yield would be low, probably just a few percent that we would find a treatable, correctable lesion.    Rather, I feel the most appropriate and important intervention for this patient is keeping him off anticoagulation, admittedly at some risk for thromboembolic complications but at the moment, the "clear and present danger" to this patient is his blood loss and associated anemia.  Going forward, to the degree that supportive care is felt appropriate in this hospice patient, periodic hemoglobin determinations with palliative transfusions for symptomatic anemia might be considered, if that is not at odds with the hospice approach.  We will sign off, but please do not hesitate to call us if it would be helpful to discuss this patient's care in more detail. -Youlanda Mighty Buccini   Goals of care and discharge planning: Patient is currently a  full code. Patient is discharging home today. [facility] will follow.   Communication with [facility]: spoke with wife at beside.  Communication with [facility]: Team updated.   Please call with any hospice related questions or concerns. Thank you,  Farrel Gordon, RN, [facility] Liaison (listed on [place]) [phone]

## 2018-04-17 NOTE — Discharge Instructions (Signed)
Follow with Primary MD Lavone Orn, MD in 7 days   Get CBC, CMP checked  by Primary MD or SNF MD in 5-7 days    Activity: As tolerated with Full fall precautions use walker/cane & assistance as needed  Disposition Home   Diet: Heart Healthy    Special Instructions: If you have smoked or chewed Tobacco  in the last 2 yrs please stop smoking, stop any regular Alcohol  and or any Recreational drug use.  On your next visit with your primary care physician please Get Medicines reviewed and adjusted.  Please request your Prim.MD to go over all Hospital Tests and Procedure/Radiological results at the follow up, please get all Hospital records sent to your Prim MD by signing hospital release before you go home.  If you experience worsening of your admission symptoms, develop shortness of breath, life threatening emergency, suicidal or homicidal thoughts you must seek medical attention immediately by calling 911 or calling your MD immediately  if symptoms less severe.  You Must read complete instructions/literature along with all the possible adverse reactions/side effects for all the Medicines you take and that have been prescribed to you. Take any new Medicines after you have completely understood and accpet all the possible adverse reactions/side effects.

## 2018-04-17 NOTE — Care Management Obs Status (Signed)
[facility] NOTIFICATION   Patient Details  Name: [name] MRN: [id number] Date of Birth: 01/21/1941   Medicare Observation Status Notification Given:  Yes    Carles Collet, RN 04/17/2018, 9:44 AM

## 2018-04-20 DIAGNOSIS — M545 Low back pain: Secondary | ICD-10-CM | POA: Diagnosis not present

## 2018-04-20 DIAGNOSIS — E039 Hypothyroidism, unspecified: Secondary | ICD-10-CM | POA: Diagnosis not present

## 2018-04-20 DIAGNOSIS — I5030 Unspecified diastolic (congestive) heart failure: Secondary | ICD-10-CM | POA: Diagnosis not present

## 2018-04-20 DIAGNOSIS — K922 Gastrointestinal hemorrhage, unspecified: Secondary | ICD-10-CM | POA: Diagnosis not present

## 2018-04-20 DIAGNOSIS — Z79899 Other long term (current) drug therapy: Secondary | ICD-10-CM | POA: Diagnosis not present

## 2018-04-20 DIAGNOSIS — N183 Chronic kidney disease, stage 3 (moderate): Secondary | ICD-10-CM | POA: Diagnosis not present

## 2018-04-20 DIAGNOSIS — N184 Chronic kidney disease, stage 4 (severe): Secondary | ICD-10-CM | POA: Diagnosis not present

## 2018-04-20 DIAGNOSIS — D500 Iron deficiency anemia secondary to blood loss (chronic): Secondary | ICD-10-CM | POA: Diagnosis not present

## 2018-05-06 ENCOUNTER — Ambulatory Visit (INDEPENDENT_AMBULATORY_CARE_PROVIDER_SITE_OTHER): Admitting: Neurology

## 2018-05-06 ENCOUNTER — Encounter: Payer: Self-pay | Admitting: Neurology

## 2018-05-06 VITALS — BP 120/70 | HR 58 | Ht 71.0 in | Wt 240.1 lb

## 2018-05-06 DIAGNOSIS — D500 Iron deficiency anemia secondary to blood loss (chronic): Secondary | ICD-10-CM | POA: Diagnosis not present

## 2018-05-06 DIAGNOSIS — Z5181 Encounter for therapeutic drug level monitoring: Secondary | ICD-10-CM | POA: Diagnosis not present

## 2018-05-06 DIAGNOSIS — R278 Other lack of coordination: Secondary | ICD-10-CM

## 2018-05-06 NOTE — Patient Instructions (Signed)
Please call me if the asterixis gets worse and we can consider starting a medication

## 2018-05-06 NOTE — Progress Notes (Signed)
Follow-up Visit   Date: 05/06/18    [name] MRN: [id number] DOB: 1941-02-01   Interim History: [name] is a 77 y.o. right-handed Caucasian male with CAD, PAD, bilateral carotid disease s/p CEA, hypertension, hyperlipidemia, hypothyroidism, former smoker, atrial fibrillation, and diabetes mellitus returning to the clinic for follow-up of abnormal limb jerks.  The patient was accompanied to the clinic by wife and son who also provides collateral information.    History of present illness: Starting around 2015, he began having problems with imbalance which has steadily worsened over the past few years.   Also over this time, he has become aware that his legs falls asleep frequently and has lack of sensation.  He finds himself often looking at his feet when walking, so that he is aware of their position.  He has suffered 16 falls over the past 3 years; the frequency has increased over the past 3-4 months.  He has fractured his right arm twice and with his fall last week, fractured the right orbit.  He always requires assistance to stand up from a fall, and often times, someone has to manually place his legs under him, in order to stand up.  His wife has noticed that when he walks, his left foot drags and slaps the floor.  He did physical therapy for gait training summer of 2018.  He has started using a shower chair because of imbalance. He has long history of peripheral arterial disease and diabetes mellitus.  His diabetes is well-controlled.  He drinks 1-2 drinks per day (liquor).    In June 2019, he was found to have critical limb ischemia of the left leg and underwent left common femoral endarterectomy, left femoral to below-knee popliteal bypass.  This was complicated by chronic left left groin infection.  UPDATE 05/06/2018:  He is here for 4 month follow-up visit.  He has multiple hospitalizations for atrial fibrillation, anemia, and heart failure.  He is now is under  hospice care for end-stage heart failure.  Over the last few weeks, he has been having intermittent jerks of the hands and legs, which is worse in the morning.  The movements are brief and abrupt, sometimes making it difficult to hold objects.  When he is at rest, the jerks are less apparent.   Medications:  Current Outpatient Medications on File Prior to Visit  Medication Sig Dispense Refill  . acetaminophen (TYLENOL) 500 MG tablet Take 1,000 mg by mouth every 6 (six) hours as needed for headache (pain).    Marland Kitchen amiodarone (PACERONE) 200 MG tablet Take 1 tablet (200 mg total) by mouth 2 (two) times daily. 180 tablet 3  . Ascorbic Acid (VITAMIN C PO) Take 1 tablet by mouth 2 (two) times daily.    Marland Kitchen atorvastatin (LIPITOR) 20 MG tablet Take 20 mg by mouth daily after supper.     . carvedilol (COREG) 3.125 MG tablet Take 1 tablet (3.125 mg total) by mouth 2 (two) times daily. 30 tablet 1  . fenofibrate 54 MG tablet Take 54 mg by mouth every morning.     . ferrous sulfate 325 (65 FE) MG tablet Take 1 tablet (325 mg total) by mouth 2 (two) times daily with a meal. 60 tablet 0  . folic acid (FOLVITE) 1 MG tablet Take 1 tablet (1 mg total) by mouth daily. 30 tablet 0  . gabapentin (NEURONTIN) 300 MG capsule Take 300 mg by mouth 2 (two) times daily after a meal.     .  hydrALAZINE (APRESOLINE) 25 MG tablet Take 1 tablet (25 mg total) by mouth every 8 (eight) hours. 90 tablet 1  . isosorbide mononitrate (IMDUR) 30 MG 24 hr tablet Take 1 tablet (30 mg total) by mouth daily. (Patient taking differently: Take 30 mg by mouth every morning. ) 30 tablet 1  . levothyroxine (SYNTHROID, LEVOTHROID) 100 MCG tablet Take 100 mcg by mouth daily before breakfast.   3  . LORazepam (ATIVAN) 0.5 MG tablet Take 0.5 mg by mouth at bedtime as needed for anxiety.    . methylcellulose (ARTIFICIAL TEARS) 1 % ophthalmic solution Place 1 drop into both eyes 2 (two) times daily as needed (dry eyes).    . Multiple Vitamin (MULTIVITAMIN  WITH MINERALS) TABS tablet Take 1 tablet by mouth daily. (Patient taking differently: Take 1 tablet by mouth every morning. )    . nitroGLYCERIN (NITROSTAT) 0.4 MG SL tablet PLACE 1 TABLET UNDER TONGUE EVERY 5 MINUTES AS NEEDED FOR CHEST PAIN. (Patient taking differently: Place 0.4 mg under the tongue every 5 (five) minutes as needed for chest pain. ) 75 tablet 1  . pantoprazole (PROTONIX) 40 MG tablet Take 1 tablet (40 mg total) by mouth daily. (Patient taking differently: Take 40 mg by mouth 2 (two) times daily. Take twice daily until 04/26/18) 30 tablet 1  . potassium chloride SA (K-DUR,KLOR-CON) 20 MEQ tablet Take by mouth as directed. (Patient taking differently: Take 20 mEq by mouth See admin instructions. Take one tablet (20 meq) by mouth when directed to do so by MD) 90 tablet 3  . pseudoephedrine-guaifenesin (MUCINEX D) 60-600 MG per tablet Take 1 tablet by mouth every 12 (twelve) hours as needed for congestion.     . tamsulosin (FLOMAX) 0.4 MG CAPS capsule Take 0.4 mg by mouth every Monday, Wednesday, and Friday.     . torsemide (DEMADEX) 20 MG tablet Take 60-80 mg by mouth See admin instructions. Take 80mg  in the AM and 60mg  in the PM.    . vitamin B-12 (CYANOCOBALAMIN) 500 MCG tablet Take 500 mcg by mouth every morning.     . zinc gluconate 50 MG tablet Take 50 mg by mouth every morning.     . torsemide (DEMADEX) 20 MG tablet Take 4 tablets (80 mg total) by mouth daily with breakfast. (Patient taking differently: Take 60-80 mg by mouth See admin instructions. Take 80mg  in the morning, and 60mg  in the evening) 120 tablet 1   No current facility-administered medications on file prior to visit.     Allergies:  Allergies  Allergen Reactions  . Amoxicillin-Pot Clavulanate Nausea Only    Has patient had a PCN reaction causing immediate rash, facial/tongue/throat swelling, SOB or lightheadedness with hypotension: No Has patient had a PCN reaction causing severe rash involving mucus membranes  or skin necrosis: No Has patient had a PCN reaction that required hospitalization: No Has patient had a PCN reaction occurring within the last 10 years: No If all of the above answers are "NO", then may proceed with Cephalosporin use.   Marland Kitchen Morphine And Related Other (See Comments)    hallucinations  . Percocet [Oxycodone-Acetaminophen] Other (See Comments)    Agitation and Increased Confusion. Family asks to please not order this medication for him.   . Restoril [Temazepam] Other (See Comments)    Confusion and Agitation. Has taken in past, but has not reacted well. Family requests to not give this mediation.     Review of Systems:  CONSTITUTIONAL: No fevers, chills, night sweats, or  weight loss.  EYES: No visual changes or eye pain ENT: No hearing changes.  No history of nose bleeds.   RESPIRATORY: No cough, wheezing +shortness of breath.   CARDIOVASCULAR: Negative for chest pain, and palpitations.   GI: Negative for abdominal discomfort, blood in stools or black stools.  No recent change in bowel habits.   GU:  +history of incontinence.   MUSCLOSKELETAL: +history of joint pain ++swelling.  No myalgias.   SKIN: Negative for lesions, rash, and itching.   ENDOCRINE: Negative for cold or heat intolerance, polydipsia or goiter.   PSYCH:  No depression or anxiety symptoms.   NEURO: As Above.   Vital Signs:  BP 120/70   Pulse (!) 58   Ht 5\' 11"  (1.803 m)   Wt 240 lb 2 oz (108.9 kg)   SpO2 95%   BMI 33.49 kg/m   General Medical Exam:   General:  Jaundice appearing, comfortable sitting in wheelchair on oxygen Ext:  Severe edema of the legs, wearing unno boots,  left foot drop supported in AFO  Neurological Exam: MENTAL STATUS including orientation to time, place, person, recent and remote memory, attention span and concentration, language, and fund of knowledge is normal.  Speech is not dysarthric.  CRANIAL NERVES:   Face is symmetric.   MOTOR:  Right > left hand asterixis,  myoclonic jerks are also seen in the legs when raising the leg for motor testing.  Right Upper Extremity:    Left Upper Extremity:    Deltoid  5/5   Deltoid  5/5   Biceps  5/5   Biceps  5/5   Triceps  5/5   Triceps  5/5   Wrist extensors  5/5   Wrist extensors  5/5   Wrist flexors  5/5   Wrist flexors  5/5   Finger extensors  5/5   Finger extensors  5/5   Finger flexors  5/5   Finger flexors  5/5   Dorsal interossei  4/5   Dorsal interossei  4/5   Abductor pollicis  4/5   Abductor pollicis  4/5   Tone (Ashworth scale)  0  Tone (Ashworth scale)  0   Right Lower Extremity:    Left Lower Extremity:    Hip flexors  2/5   Hip flexors  2/5   Hip extensors  5/5   Hip extensors  5/5   Knee flexors  5/5   Knee flexors  5/5   Knee extensors  5/5   Knee extensors  5/5   Dorsiflexors  4/5   Dorsiflexors  3/5   Plantarflexors  5/5   Plantarflexors  2/5   Toe extensors  4/5   Toe extensors  2/5   Tone (Ashworth scale)  0  Tone (Ashworth scale)  0   MSRs:  Reflexes are 2+/4 in the arms and absent in the legs  SENSORY:  Vibration is absent at the knees and ankles   COORDINATION/GAIT:  Gait not tested as patient is wheelchair.  Data: NCS/EMG of the legs 08/31/2017: The electrophysiologic findings are most consistent with a chronic and severe sensorimotor axonal polyneuropathy affecting the lower extremities, which is worse on the left.  A superimposed multilevel intraspinal canal lesion process affecting the L3-S1 myotomes cannot be excluded.  MRI brain 09/21/2017:  No acute abnormality. Progression of atrophy and chronic microvascular ischemia since 2014  MRI thoracic spine 08/11/2017: 1. No acute traumatic injury within the thoracic spine. 2. Mild multilevel degenerative disc bulging at T3-4 through  T9-10 as above without significant stenosis. 3. Small layering bilateral pleural effusions.  MR LUMBAR SPINE 08/11/2017: 1. No acute traumatic injury within the lumbar spine. 2. Multilevel  degenerative spondylolysis with resultant mild to moderate canal and lateral recess stenosis at L2-3 and L3-4. Mild bilateral foraminal narrowing at L2 through L4. 3. **An incidental finding of potential clinical significance has been found. Aneurysmal dilatation of the infrarenal aorta up to 3 cm.   IMPRESSION/PLAN: Asterixis causing intermittent myoclonic jerks of the arms and legs. He appears jaundiced and I suspect that he is have multiorgan failure with liver damage, uremia, and electrolyte derangements.  I offered adding clonazepam 0.25mg  in the morning to control movements, but wife states that he is already very sleepy during the day and the movements are bothersome enough to start medication.  He is taking tamazepam for insomnia and this may linger into daytime to provide some relief.  He is under hospice care due to end-stage heart failure.  At this point, I explained that goal of care is quality of life, so if his jerks interfere with day-to-day activities such as feeding himself, we can start low dose short-acting benzodiazepine and monitor closely for sedation.  Greater than 50% of this 30 minute visit was spent in counseling, explanation of diagnosis, planning of further management, and coordination of care.  Thank you for allowing me to participate in patient's care.  If I can answer any additional questions, I would be pleased to do so.    Sincerely,    Santosha Jividen K. Posey Pronto, DO

## 2018-05-16 DIAGNOSIS — I5030 Unspecified diastolic (congestive) heart failure: Secondary | ICD-10-CM | POA: Diagnosis not present

## 2018-05-16 DIAGNOSIS — R278 Other lack of coordination: Secondary | ICD-10-CM | POA: Diagnosis not present

## 2018-05-16 DIAGNOSIS — K922 Gastrointestinal hemorrhage, unspecified: Secondary | ICD-10-CM | POA: Diagnosis not present

## 2018-07-08 DIAGNOSIS — I2510 Atherosclerotic heart disease of native coronary artery without angina pectoris: Secondary | ICD-10-CM | POA: Diagnosis not present

## 2018-07-08 DIAGNOSIS — N184 Chronic kidney disease, stage 4 (severe): Secondary | ICD-10-CM | POA: Diagnosis not present

## 2018-07-08 DIAGNOSIS — Z0000 Encounter for general adult medical examination without abnormal findings: Secondary | ICD-10-CM | POA: Diagnosis not present

## 2018-07-08 DIAGNOSIS — D649 Anemia, unspecified: Secondary | ICD-10-CM | POA: Diagnosis not present

## 2018-07-08 DIAGNOSIS — E1129 Type 2 diabetes mellitus with other diabetic kidney complication: Secondary | ICD-10-CM | POA: Diagnosis not present

## 2018-07-08 DIAGNOSIS — E039 Hypothyroidism, unspecified: Secondary | ICD-10-CM | POA: Diagnosis not present

## 2018-07-08 DIAGNOSIS — I272 Pulmonary hypertension, unspecified: Secondary | ICD-10-CM | POA: Diagnosis not present

## 2018-07-08 DIAGNOSIS — E11621 Type 2 diabetes mellitus with foot ulcer: Secondary | ICD-10-CM | POA: Diagnosis not present

## 2018-07-08 DIAGNOSIS — E1122 Type 2 diabetes mellitus with diabetic chronic kidney disease: Secondary | ICD-10-CM | POA: Diagnosis not present

## 2018-07-08 DIAGNOSIS — I5040 Unspecified combined systolic (congestive) and diastolic (congestive) heart failure: Secondary | ICD-10-CM | POA: Diagnosis not present

## 2018-07-08 DIAGNOSIS — I739 Peripheral vascular disease, unspecified: Secondary | ICD-10-CM | POA: Diagnosis not present

## 2018-07-08 DIAGNOSIS — Z1389 Encounter for screening for other disorder: Secondary | ICD-10-CM | POA: Diagnosis not present

## 2018-07-08 DIAGNOSIS — E1142 Type 2 diabetes mellitus with diabetic polyneuropathy: Secondary | ICD-10-CM | POA: Diagnosis not present

## 2018-07-08 DIAGNOSIS — I480 Paroxysmal atrial fibrillation: Secondary | ICD-10-CM | POA: Diagnosis not present

## 2018-07-08 DIAGNOSIS — E1151 Type 2 diabetes mellitus with diabetic peripheral angiopathy without gangrene: Secondary | ICD-10-CM | POA: Diagnosis not present

## 2018-07-08 DIAGNOSIS — Z23 Encounter for immunization: Secondary | ICD-10-CM | POA: Diagnosis not present

## 2018-07-08 DIAGNOSIS — I129 Hypertensive chronic kidney disease with stage 1 through stage 4 chronic kidney disease, or unspecified chronic kidney disease: Secondary | ICD-10-CM | POA: Diagnosis not present

## 2018-07-18 ENCOUNTER — Encounter (INDEPENDENT_AMBULATORY_CARE_PROVIDER_SITE_OTHER): Payer: Self-pay | Admitting: Orthopedic Surgery

## 2018-07-18 ENCOUNTER — Ambulatory Visit (INDEPENDENT_AMBULATORY_CARE_PROVIDER_SITE_OTHER): Payer: Medicare HMO | Admitting: Orthopedic Surgery

## 2018-07-18 VITALS — Ht 71.0 in | Wt 240.1 lb

## 2018-07-18 DIAGNOSIS — I87323 Chronic venous hypertension (idiopathic) with inflammation of bilateral lower extremity: Secondary | ICD-10-CM

## 2018-07-18 DIAGNOSIS — L97421 Non-pressure chronic ulcer of left heel and midfoot limited to breakdown of skin: Secondary | ICD-10-CM

## 2018-07-18 DIAGNOSIS — I5030 Unspecified diastolic (congestive) heart failure: Secondary | ICD-10-CM | POA: Diagnosis not present

## 2018-07-18 DIAGNOSIS — Z5181 Encounter for therapeutic drug level monitoring: Secondary | ICD-10-CM | POA: Diagnosis not present

## 2018-07-18 MED ORDER — DOXYCYCLINE HYCLATE 100 MG PO TABS: 100.0000 mg | ORAL_TABLET | Freq: Two times a day (BID) | ORAL | 0 refills | Status: DC

## 2018-07-18 NOTE — Progress Notes (Signed)
Office Visit Note   Patient: [name]           Date of Birth: [birth year]           MRN: [id number] Visit Date: 07/18/2018              Requested by: Lavone Orn, MD [address] [place], [place] [address] PCP: Lavone Orn, MD  Chief Complaint  Patient presents with  . Left Foot - Follow-up, Pain      HPI: Patient is a 78 year old gentleman who presents in follow-up for venous insufficiency as well as a decubitus left heel ulcer with a blood blister.  Patient states the heel blister is been there for 3 to 4 weeks.  Patient was initially referred to vascular surgery from this office he has had revascularization work and has excellent pulses on the left lower extremity.  Assessment & Plan: Visit Diagnoses:  1. Non-pressure chronic ulcer of left heel and midfoot limited to breakdown of skin ([place])   2. Idiopathic chronic venous hypertension of both lower extremities with inflammation     Plan: Patient was given a prescription for 30 to 40 mm compression stocking he will wear the sock around-the-clock to wear the sleeve on top of the sock during waking hours.  He will wash his leg daily with soap and water.  Patient is given a prescription for an extra-large sock due to the increased swelling of his leg.  He was given a prescription for doxycycline I am worried that there may be some tunneling from the heel ulcer.  Follow-Up Instructions: Return in about 2 weeks (around 08/01/2018).   Ortho Exam  Patient is alert, oriented, no adenopathy, well-dressed, normal affect, normal respiratory effort. Examination patient has an excellent dorsalis pedis and posterior tibial pulse on the left.  He has increased venous stasis swelling the brawny edema his calf now measures 45 cm in circumference.  The blood blister was decompressed and there is good superficial epithelialization from the majority of the blister which is 2 cm in diameter there is a very small area in the  center of the ulcer which is concerning for possible tunneling.  Imaging: No results found. No images are attached to the encounter.  Labs: Lab Results  Component Value Date   HGBA1C 6.2 (H) 10/21/2017   HGBA1C 6.4 (H) 09/24/2011   HGBA1C 7.8 (H) 09/07/2011   REPTSTATUS 03/28/2018 FINAL 03/23/2018   GRAMSTAIN  11/10/2017    NO WBC SEEN NO ORGANISMS SEEN Performed at [facility], [place] [address], [place], [place] [address]    CULT  03/23/2018    NO GROWTH 5 DAYS Performed at [facility] [address] [place], [place] [address]    [place] 03/20/2018     Lab Results  Component Value Date   ALBUMIN 2.9 (L) 04/16/2018   ALBUMIN 2.5 (L) 04/06/2018   ALBUMIN 2.7 (L) 04/05/2018   PREALBUMIN 15.9 (L) 11/09/2017   PREALBUMIN 9.0 (L) 10/29/2017    Body mass index is 33.49 kg/m.  Orders:  No orders of the defined types were placed in this encounter.  Meds ordered this encounter  Medications  . doxycycline (VIBRA-TABS) 100 MG tablet    Sig: Take 1 tablet (100 mg total) by mouth 2 (two) times daily.    Dispense:  30 tablet    Refill:  0     Procedures: No procedures performed  Clinical Data: No additional findings.  ROS:  All other systems negative, except as noted in the HPI. Review of Systems  Objective: Vital Signs: Ht 5\' 11"  (1.803 m)   Wt 240 lb 2.1 oz (108.9 kg)   BMI 33.49 kg/m   Specialty Comments:  No specialty comments available.  PMFS History: Patient Active Problem List   Diagnosis Date Noted  . Acute blood loss anemia 04/16/2018  . Acute on chronic anemia 04/16/2018  . Weakness generalized   . DNR (do not resuscitate) discussion   . Palliative care by specialist   . Transient alteration of awareness   . AKI (acute kidney injury) ([place])   . Acute on chronic congestive heart failure ([place]) 03/18/2018  . Symptomatic anemia 03/08/2018  . Atrial fibrillation ([place]) 03/08/2018  . Hyperlipidemia 03/08/2018    . Chronic back pain 03/08/2018  . Combined systolic and diastolic heart failure ([place]) 03/08/2018  . GI bleed 03/07/2018  . Coronary artery disease involving coronary bypass graft of native heart without angina pectoris   . Benign essential HTN   . PAD (peripheral artery disease) ([place])   . New onset atrial fibrillation ([place])   . Tachypnea   . Hyponatremia   . Malnutrition of moderate degree 02/27/2018  . Volume overload 11/10/2017  . Surgical wound infection 11/04/2017  . Polyneuropathy associated with underlying disease ([place]) 08/16/2017  . Essential hypertension 02/06/2014  . Acute on chronic combined systolic and diastolic CHF (congestive heart failure) ([place]) 02/06/2014  . Chronic kidney disease, stage III (moderate) ([place]) 02/06/2014  . Aftercare following surgery of the circulatory system, [place] 01/25/2014  . Carotid stenosis 06/08/2013  . Stricture of artery ([place]) 04/14/2012  . Peripheral vascular disease, unspecified ([place]) 10/08/2011  . DVT of upper extremity (deep vein thrombosis), left 09/29/2011  . S/P CABG (coronary artery bypass graft) 09/29/2011  . H/O carotid endarterectomy 09/29/2011  . Occlusion and stenosis of carotid artery without mention of cerebral infarction 06/11/2011  . Diabetes mellitus type 2 with complications ([place]) 38/[id number]/7342   Past Medical History:  Diagnosis Date  . Arthritis   . Atherosclerotic heart disease   . Back pain   . Bruises easily    takes Pletal daily and ASA 325mg  daily  . Cataract immature    right  . Chest pain   . Colon polyps   . Complication of anesthesia    hard to wake up with bypass surgery  . Cyst    on perineum;takes Doxycycline daily  . Diabetes mellitus    takes Glipizide,Metformin,and Actos daily  . Diarrhea   . Dyspnea    when lying flat  . Edema    right leg knee down swollen since fall 3 wks ago  . Foot drop, left   . GERD (gastroesophageal reflux disease)    Rolaids as needed-occ reflux  . Hemorrhoids   .  History of kidney stones    at age 23   . Hyperlipidemia    takes Tricor and Lipitor daily  . Hypertension    takes Altace,Amlodipine,and Nadolol daily  . Hypothyroidism   . Kidney stone    35+yrs ago  . Muscle pain   . Nasal polyps    hx of  . Peripheral edema    wears knee length hose-told by podiatrist to wear these  . Peripheral neuropathy    takes Gabapentin daily  . Pneumonia    as a child  . PVD (peripheral vascular disease) ([place])   . Renal insufficiency   . Seasonal allergies  takes Mucinex and Zyrtec prn  . Urinary frequency    urgency/takes Vesicare daily    Family History  Problem Relation Age of Onset  . Heart disease Mother   . Heart disease Father   . Anesthesia problems Neg Hx   . Hypotension Neg Hx   . Malignant hyperthermia Neg Hx   . Pseudochol deficiency Neg Hx     Past Surgical History:  Procedure Laterality Date  . Aortobifemoral bypass    . APPLICATION OF WOUND VAC Left 10/25/2017   Procedure: Wound vac placement to left groin.;  Surgeon: Elam Dutch, MD;  Location: [facility] OR;  Service: Vascular;  Laterality: Left;  . APPLICATION OF WOUND VAC Left 11/10/2017   Procedure: APPLICATION OF WOUND VAC;  Surgeon: Elam Dutch, MD;  Location: [facility];  Service: Vascular;  Laterality: Left;  . CARDIAC CATHETERIZATION  most recent 05/2011   total of 4  . CARDIOVERSION N/A 03/22/2018   Procedure: CARDIOVERSION;  Surgeon: Jerline Pain, MD;  Location: [facility] ENDOSCOPY;  Service: Cardiovascular;  Laterality: N/A;  . CAROTID ANGIOGRAM N/A 06/05/2011   Procedure: CAROTID Cyril Loosen;  Surgeon: Elam Dutch, MD;  Location: [facility] CATH LAB;  Service: Cardiovascular;  Laterality: N/A;  . CAROTID ENDARTERECTOMY  04/08/2011   left CEA  . cataract surgery Bilateral    left  . COLONOSCOPY    . COLONOSCOPY WITH PROPOFOL N/A 08/22/2013   Procedure: COLONOSCOPY WITH PROPOFOL;  Surgeon: Garlan Fair, MD;  Location: [facility] ENDOSCOPY;  Service: Endoscopy;  Laterality: N/A;    . CORONARY ARTERY BYPASS GRAFT  09/09/2011   Procedure: CORONARY ARTERY BYPASS GRAFTING (CABG);  Surgeon: Gaye Pollack, MD;  Location: [facility];  Service: Open Heart Surgery;  Laterality: N/A;  Coronary artery bypass grafting  x three with Right saphenous vein harvested endoscopically and left internal mammary artery  . ENDARTERECTOMY  04/08/2011   Procedure: ENDARTERECTOMY CAROTID;  Surgeon: Elam Dutch, MD;  Location: [facility] OR;  Service: Vascular;  Laterality: Left;  with patch angioplasty  . ENDARTERECTOMY  09/09/2011   Procedure: ENDARTERECTOMY CAROTID;  Surgeon: Elam Dutch, MD;  Location: [facility] OR;  Service: Vascular;  Laterality: Right;  with patch angioplasty  . ESOPHAGOGASTRODUODENOSCOPY (EGD) WITH PROPOFOL N/A 03/10/2018   Procedure: ESOPHAGOGASTRODUODENOSCOPY (EGD) WITH PROPOFOL;  Surgeon: Arta Silence, MD;  Location: [place] [facility];  Service: Endoscopy;  Laterality: N/A;  . FEMORAL-POPLITEAL BYPASS GRAFT Left 10/25/2017   Procedure: Left Common Femoral Endarterectomy and perfundaplasty, Left BYPASS GRAFT FEMORAL-BELOW KNEE POPLITEAL ARTERY.;  Surgeon: Elam Dutch, MD;  Location: [place];  Service: Vascular;  Laterality: Left;  . GROIN DEBRIDEMENT Left 11/10/2017   Procedure: GROIN DEBRIDEMENT;  Surgeon: Elam Dutch, MD;  Location: [facility];  Service: Vascular;  Laterality: Left;  . LEFT HEART CATHETERIZATION WITH CORONARY ANGIOGRAM N/A 07/09/2011   Procedure: LEFT HEART CATHETERIZATION WITH CORONARY ANGIOGRAM;  Surgeon: Sinclair Grooms, MD;  Location: [facility] CATH LAB;  Service: Cardiovascular;  Laterality: N/A;  . LOWER EXTREMITY ANGIOGRAPHY N/A 10/11/2017   Procedure: LOWER EXTREMITY ANGIOGRAPHY;  Surgeon: Waynetta Sandy, MD;  Location: [facility] CV LAB;  Service: Cardiovascular;  Laterality: N/A;  . Reimplantation of inferior mesenteric artery    . Repair of infrarenal abdominal aortic aneurysm    . RIGHT HEART CATH N/A 04/04/2018   Procedure: RIGHT HEART CATH;   Surgeon: Larey Dresser, MD;  Location: [place] [place] CV LAB;  Service: Cardiovascular;  Laterality: N/A;  . SHOULDER ARTHROSCOPY W/ ROTATOR  CUFF REPAIR     right and left-open procedures  . TEE WITHOUT CARDIOVERSION N/A 03/22/2018   Procedure: TRANSESOPHAGEAL ECHOCARDIOGRAM (TEE);  Surgeon: Jerline Pain, MD;  Location[facility] ENDOSCOPY;  Service: Cardiovascular;  Laterality: N/A;  . TONSILLECTOMY     at age 69  . TRIGGER FINGER RELEASE Right 06/28/2014   Procedure: RIGHT LONG TRIGGER RELEASE ;  Surgeon: Leanora Cover, MD;  Location: [place];  Service: Orthopedics;  Laterality: Right;  . wisdom teeth extracted     as a teenager  . WOUND DEBRIDEMENT Left 11/05/2017   Procedure: DEBRIDEMENT WOUND LEFT GROIN with wound vac placement;  Surgeon: Elam Dutch, MD;  Location: [facility] OR;  Service: Vascular;  Laterality: Left;   Social History   Occupational History  . Occupation: retired Chief Executive Officer  Tobacco Use  . Smoking status: Former Smoker    Years: 40.00    Types: Cigarettes    Last attempt to quit: 10/27/2005    Years since quitting: 12.7  . Smokeless tobacco: Never Used  Substance and Sexual Activity  . Alcohol use: Yes    Alcohol/week: 2.0 - 3.0 standard drinks    Types: 2 - 3 Cans of beer per week    Comment: occasional- weekly  . Drug use: No  . Sexual activity: Not Currently

## 2018-07-24 DIAGNOSIS — K922 Gastrointestinal hemorrhage, unspecified: Secondary | ICD-10-CM | POA: Diagnosis not present

## 2018-07-24 DIAGNOSIS — E1159 Type 2 diabetes mellitus with other circulatory complications: Secondary | ICD-10-CM | POA: Diagnosis not present

## 2018-07-24 DIAGNOSIS — I25119 Atherosclerotic heart disease of native coronary artery with unspecified angina pectoris: Secondary | ICD-10-CM | POA: Diagnosis not present

## 2018-07-24 DIAGNOSIS — E785 Hyperlipidemia, unspecified: Secondary | ICD-10-CM | POA: Diagnosis not present

## 2018-07-24 DIAGNOSIS — N390 Urinary tract infection, site not specified: Secondary | ICD-10-CM | POA: Diagnosis not present

## 2018-07-24 DIAGNOSIS — I739 Peripheral vascular disease, unspecified: Secondary | ICD-10-CM | POA: Diagnosis not present

## 2018-07-24 DIAGNOSIS — I429 Cardiomyopathy, unspecified: Secondary | ICD-10-CM | POA: Diagnosis not present

## 2018-07-24 DIAGNOSIS — N184 Chronic kidney disease, stage 4 (severe): Secondary | ICD-10-CM | POA: Diagnosis not present

## 2018-07-24 DIAGNOSIS — I4892 Unspecified atrial flutter: Secondary | ICD-10-CM | POA: Diagnosis not present

## 2018-07-24 DIAGNOSIS — I5020 Unspecified systolic (congestive) heart failure: Secondary | ICD-10-CM | POA: Diagnosis not present

## 2018-07-25 DIAGNOSIS — I5020 Unspecified systolic (congestive) heart failure: Secondary | ICD-10-CM | POA: Diagnosis not present

## 2018-07-25 DIAGNOSIS — I429 Cardiomyopathy, unspecified: Secondary | ICD-10-CM | POA: Diagnosis not present

## 2018-07-25 DIAGNOSIS — I25119 Atherosclerotic heart disease of native coronary artery with unspecified angina pectoris: Secondary | ICD-10-CM | POA: Diagnosis not present

## 2018-07-25 DIAGNOSIS — E1159 Type 2 diabetes mellitus with other circulatory complications: Secondary | ICD-10-CM | POA: Diagnosis not present

## 2018-07-25 DIAGNOSIS — I739 Peripheral vascular disease, unspecified: Secondary | ICD-10-CM | POA: Diagnosis not present

## 2018-07-25 DIAGNOSIS — I4892 Unspecified atrial flutter: Secondary | ICD-10-CM | POA: Diagnosis not present

## 2018-07-27 DIAGNOSIS — I5020 Unspecified systolic (congestive) heart failure: Secondary | ICD-10-CM | POA: Diagnosis not present

## 2018-07-27 DIAGNOSIS — E1159 Type 2 diabetes mellitus with other circulatory complications: Secondary | ICD-10-CM | POA: Diagnosis not present

## 2018-07-27 DIAGNOSIS — I429 Cardiomyopathy, unspecified: Secondary | ICD-10-CM | POA: Diagnosis not present

## 2018-07-27 DIAGNOSIS — I25119 Atherosclerotic heart disease of native coronary artery with unspecified angina pectoris: Secondary | ICD-10-CM | POA: Diagnosis not present

## 2018-07-27 DIAGNOSIS — I4892 Unspecified atrial flutter: Secondary | ICD-10-CM | POA: Diagnosis not present

## 2018-07-27 DIAGNOSIS — I739 Peripheral vascular disease, unspecified: Secondary | ICD-10-CM | POA: Diagnosis not present

## 2018-07-29 DIAGNOSIS — I429 Cardiomyopathy, unspecified: Secondary | ICD-10-CM | POA: Diagnosis not present

## 2018-07-29 DIAGNOSIS — I5020 Unspecified systolic (congestive) heart failure: Secondary | ICD-10-CM | POA: Diagnosis not present

## 2018-07-29 DIAGNOSIS — I739 Peripheral vascular disease, unspecified: Secondary | ICD-10-CM | POA: Diagnosis not present

## 2018-07-29 DIAGNOSIS — E1159 Type 2 diabetes mellitus with other circulatory complications: Secondary | ICD-10-CM | POA: Diagnosis not present

## 2018-07-29 DIAGNOSIS — I4892 Unspecified atrial flutter: Secondary | ICD-10-CM | POA: Diagnosis not present

## 2018-07-29 DIAGNOSIS — I25119 Atherosclerotic heart disease of native coronary artery with unspecified angina pectoris: Secondary | ICD-10-CM | POA: Diagnosis not present

## 2018-08-01 ENCOUNTER — Ambulatory Visit (INDEPENDENT_AMBULATORY_CARE_PROVIDER_SITE_OTHER): Payer: Medicare HMO | Admitting: Orthopedic Surgery

## 2018-08-01 ENCOUNTER — Encounter (INDEPENDENT_AMBULATORY_CARE_PROVIDER_SITE_OTHER): Payer: Self-pay | Admitting: Orthopedic Surgery

## 2018-08-01 VITALS — Ht 71.0 in | Wt 240.1 lb

## 2018-08-01 DIAGNOSIS — I739 Peripheral vascular disease, unspecified: Secondary | ICD-10-CM | POA: Diagnosis not present

## 2018-08-01 DIAGNOSIS — I25119 Atherosclerotic heart disease of native coronary artery with unspecified angina pectoris: Secondary | ICD-10-CM | POA: Diagnosis not present

## 2018-08-01 DIAGNOSIS — L97421 Non-pressure chronic ulcer of left heel and midfoot limited to breakdown of skin: Secondary | ICD-10-CM | POA: Diagnosis not present

## 2018-08-01 DIAGNOSIS — I4892 Unspecified atrial flutter: Secondary | ICD-10-CM | POA: Diagnosis not present

## 2018-08-01 DIAGNOSIS — I87323 Chronic venous hypertension (idiopathic) with inflammation of bilateral lower extremity: Secondary | ICD-10-CM

## 2018-08-01 DIAGNOSIS — E1159 Type 2 diabetes mellitus with other circulatory complications: Secondary | ICD-10-CM | POA: Diagnosis not present

## 2018-08-01 DIAGNOSIS — I5020 Unspecified systolic (congestive) heart failure: Secondary | ICD-10-CM | POA: Diagnosis not present

## 2018-08-01 DIAGNOSIS — I429 Cardiomyopathy, unspecified: Secondary | ICD-10-CM | POA: Diagnosis not present

## 2018-08-01 NOTE — Progress Notes (Signed)
Office Visit Note   Patient: [name]           Date of Birth: 21-Oct-1940           MRN: [id number] Visit Date: 08/01/2018              Requested by: Lavone Orn, MD [address] [place], [place] [address] PCP: Lavone Orn, MD  Chief Complaint  Patient presents with  . Left Foot - Follow-up      HPI: Patient is a 78 year old gentleman who presents in follow-up for venous insufficiency left lower extremity currently wearing compression stockings as well as a left heel decubitus ulcer.  Assessment & Plan: Visit Diagnoses:  1. Non-pressure chronic ulcer of left heel and midfoot limited to breakdown of skin ([place])   2. Idiopathic chronic venous hypertension of both lower extremities with inflammation     Plan: Patient will continue with the medical compression stockings.  Follow-up in 2 months.  Discussed the importance of muscle strengthening to work the calf pump elevation and compression.  Follow-Up Instructions: Return in about 2 months (around 10/01/2018).   Ortho Exam  Patient is alert, oriented, no adenopathy, well-dressed, normal affect, normal respiratory effort. Examination patient has decreased swelling his left calf is 42 cm in circumference.  The left heel ulcer has good epithelization with no cellulitis no drainage no signs of infection.  There is superficial epithelialization.  There is brawny skin color changes in the calf but no cellulitis.  Imaging: No results found. No images are attached to the encounter.  Labs: Lab Results  Component Value Date   HGBA1C 6.2 (H) 10/21/2017   HGBA1C 6.4 (H) 09/24/2011   HGBA1C 7.8 (H) 09/07/2011   REPTSTATUS 03/28/2018 FINAL 03/23/2018   GRAMSTAIN  11/10/2017    NO WBC SEEN NO ORGANISMS SEEN Performed at [facility], [place] [address] [place], [place] [address]    CULT  03/23/2018    NO GROWTH 5 DAYS Performed at [facility] [address], [place], [place] [address]    [place] 03/20/2018     Lab Results  Component Value Date   ALBUMIN 2.9 (L) 04/16/2018   ALBUMIN 2.5 (L) 04/06/2018   ALBUMIN 2.7 (L) 04/05/2018   PREALBUMIN 15.9 (L) 11/09/2017   PREALBUMIN 9.0 (L) 10/29/2017    Body mass index is 33.49 kg/m.  Orders:  No orders of the defined types were placed in this encounter.  No orders of the defined types were placed in this encounter.    Procedures: No procedures performed  Clinical Data: No additional findings.  ROS:  All other systems negative, except as noted in the HPI. Review of Systems  Objective: Vital Signs: Ht 5\' 11"  (1.803 m)   Wt 240 lb 2.1 oz (108.9 kg)   BMI 33.49 kg/m   Specialty Comments:  No specialty comments available.  PMFS History: Patient Active Problem List   Diagnosis Date Noted  . Acute blood loss anemia 04/16/2018  . Acute on chronic anemia 04/16/2018  . Weakness generalized   . DNR (do not resuscitate) discussion   . Palliative care by specialist   . Transient alteration of awareness   . AKI (acute kidney injury) ([place])   . Acute on chronic congestive heart failure ([place]) 03/18/2018  . Symptomatic anemia 03/08/2018  . Atrial fibrillation ([place]) 03/08/2018  . Hyperlipidemia 03/08/2018  . Chronic back pain 03/08/2018  . Combined systolic and diastolic heart failure ([place])  03/08/2018  . GI bleed 03/07/2018  . Coronary artery disease involving coronary bypass graft of native heart without angina pectoris   . Benign essential HTN   . PAD (peripheral artery disease) ([place])   . New onset atrial fibrillation ([place])   . Tachypnea   . Hyponatremia   . Malnutrition of moderate degree 02/27/2018  . Volume overload 11/10/2017  . Surgical wound infection 11/04/2017  . Polyneuropathy associated with underlying disease ([place]) 08/16/2017  . Essential hypertension 02/06/2014  . Acute on chronic combined systolic and diastolic CHF (congestive heart failure) ([place]) 02/06/2014  . Chronic  kidney disease, stage III (moderate) ([place]) 02/06/2014  . Aftercare following surgery of the circulatory system, [place] 01/25/2014  . Carotid stenosis 06/08/2013  . Stricture of artery ([place]) 04/14/2012  . Peripheral vascular disease, unspecified ([place]) 10/08/2011  . DVT of upper extremity (deep vein thrombosis), left 09/29/2011  . S/P CABG (coronary artery bypass graft) 09/29/2011  . H/O carotid endarterectomy 09/29/2011  . Occlusion and stenosis of carotid artery without mention of cerebral infarction 06/11/2011  . Diabetes mellitus type 2 with complications ([place]) 92/33/0076   Past Medical History:  Diagnosis Date  . Arthritis   . Atherosclerotic heart disease   . Back pain   . Bruises easily    takes Pletal daily and ASA 325mg  daily  . Cataract immature    right  . Chest pain   . Colon polyps   . Complication of anesthesia    hard to wake up with bypass surgery  . Cyst    on perineum;takes Doxycycline daily  . Diabetes mellitus    takes Glipizide,Metformin,and Actos daily  . Diarrhea   . Dyspnea    when lying flat  . Edema    right leg knee down swollen since fall 3 wks ago  . Foot drop, left   . GERD (gastroesophageal reflux disease)    Rolaids as needed-occ reflux  . Hemorrhoids   . History of kidney stones    at age 54   . Hyperlipidemia    takes Tricor and Lipitor daily  . Hypertension    takes Altace,Amlodipine,and Nadolol daily  . Hypothyroidism   . Kidney stone    35+yrs ago  . Muscle pain   . Nasal polyps    hx of  . Peripheral edema    wears knee length hose-told by podiatrist to wear these  . Peripheral neuropathy    takes Gabapentin daily  . Pneumonia    as a child  . PVD (peripheral vascular disease) ([place])   . Renal insufficiency   . Seasonal allergies    takes Mucinex and Zyrtec prn  . Urinary frequency    urgency/takes Vesicare daily    Family History  Problem Relation Age of Onset  . Heart disease Mother   . Heart disease Father   .  Anesthesia problems Neg Hx   . Hypotension Neg Hx   . Malignant hyperthermia Neg Hx   . Pseudochol deficiency Neg Hx     Past Surgical History:  Procedure Laterality Date  . Aortobifemoral bypass    . APPLICATION OF WOUND VAC Left 10/25/2017   Procedure: Wound vac placement to left groin.;  Surgeon: Elam Dutch, MD;  Location: [facility] OR;  Service: Vascular;  Laterality: Left;  . APPLICATION OF WOUND VAC Left 11/10/2017   Procedure: APPLICATION OF WOUND VAC;  Surgeon: Elam Dutch, MD;  Location: [facility];  Service: Vascular;  Laterality: Left;  . CARDIAC CATHETERIZATION  most recent 05/2011   total of 4  . CARDIOVERSION N/A 03/22/2018   Procedure: CARDIOVERSION;  Surgeon: Jerline Pain, MD;  Location: [facility] ENDOSCOPY;  Service: Cardiovascular;  Laterality: N/A;  . CAROTID ANGIOGRAM N/A 06/05/2011   Procedure: CAROTID Cyril Loosen;  Surgeon: Elam Dutch, MD;  Location: [facility] CATH LAB;  Service: Cardiovascular;  Laterality: N/A;  . CAROTID ENDARTERECTOMY  04/08/2011   left CEA  . cataract surgery Bilateral    left  . COLONOSCOPY    . COLONOSCOPY WITH PROPOFOL N/A 08/22/2013   Procedure: COLONOSCOPY WITH PROPOFOL;  Surgeon: Garlan Fair, MD;  Location: [facility] ENDOSCOPY;  Service: Endoscopy;  Laterality: N/A;  . CORONARY ARTERY BYPASS GRAFT  09/09/2011   Procedure: CORONARY ARTERY BYPASS GRAFTING (CABG);  Surgeon: Gaye Pollack, MD;  Location: [facility];  Service: Open Heart Surgery;  Laterality: N/A;  Coronary artery bypass grafting  x three with Right saphenous vein harvested endoscopically and left internal mammary artery  . ENDARTERECTOMY  04/08/2011   Procedure: ENDARTERECTOMY CAROTID;  Surgeon: Elam Dutch, MD;  Location: [facility] OR;  Service: Vascular;  Laterality: Left;  with patch angioplasty  . ENDARTERECTOMY  09/09/2011   Procedure: ENDARTERECTOMY CAROTID;  Surgeon: Elam Dutch, MD;  Location: [facility] OR;  Service: Vascular;  Laterality: Right;  with patch angioplasty  .  ESOPHAGOGASTRODUODENOSCOPY (EGD) WITH PROPOFOL N/A 03/10/2018   Procedure: ESOPHAGOGASTRODUODENOSCOPY (EGD) WITH PROPOFOL;  Surgeon: Arta Silence, MD;  Location: [facility];  Service: Endoscopy;  Laterality: N/A;  . FEMORAL-POPLITEAL BYPASS GRAFT Left 10/25/2017   Procedure: Left Common Femoral Endarterectomy and perfundaplasty, Left BYPASS GRAFT FEMORAL-BELOW KNEE POPLITEAL ARTERY.;  Surgeon: Elam Dutch, MD;  Location: [facility];  Service: Vascular;  Laterality: Left;  . GROIN DEBRIDEMENT Left 11/10/2017   Procedure: GROIN DEBRIDEMENT;  Surgeon: Elam Dutch, MD;  Location: [facility];  Service: Vascular;  Laterality: Left;  . LEFT HEART CATHETERIZATION WITH CORONARY ANGIOGRAM N/A 07/09/2011   Procedure: LEFT HEART CATHETERIZATION WITH CORONARY ANGIOGRAM;  Surgeon: Sinclair Grooms, MD;  Location: [facility] CATH LAB;  Service: Cardiovascular;  Laterality: N/A;  . LOWER EXTREMITY ANGIOGRAPHY N/A 10/11/2017   Procedure: LOWER EXTREMITY ANGIOGRAPHY;  Surgeon: Waynetta Sandy, MD;  Location: [facility] CV LAB;  Service: Cardiovascular;  Laterality: N/A;  . Reimplantation of inferior mesenteric artery    . Repair of infrarenal abdominal aortic aneurysm    . RIGHT HEART CATH N/A 04/04/2018   Procedure: RIGHT HEART CATH;  Surgeon: Larey Dresser, MD;  Location: [facility] CV LAB;  Service: Cardiovascular;  Laterality: N/A;  . SHOULDER ARTHROSCOPY W/ ROTATOR CUFF REPAIR     right and left-open procedures  . TEE WITHOUT CARDIOVERSION N/A 03/22/2018   Procedure: TRANSESOPHAGEAL ECHOCARDIOGRAM (TEE);  Surgeon: Jerline Pain, MD;  Location: [facility] ENDOSCOPY;  Service: Cardiovascular;  Laterality: N/A;  . TONSILLECTOMY     at age 87  . TRIGGER FINGER RELEASE Right 06/28/2014   Procedure: RIGHT LONG TRIGGER RELEASE ;  Surgeon: Leanora Cover, MD;  Location: [facility];  Service: Orthopedics;  Laterality: Right;  . wisdom teeth extracted     as a teenager  . WOUND DEBRIDEMENT Left  11/05/2017   Procedure: DEBRIDEMENT WOUND LEFT GROIN with wound vac placement;  Surgeon: Elam Dutch, MD;  Location: [facility] OR;  Service: Vascular;  Laterality: Left;   Social History   Occupational History  . Occupation: retired Chief Executive Officer  Tobacco Use  . Smoking status: Former Smoker  Years: 40.00    Types: Cigarettes    Last attempt to quit: 10/27/2005    Years since quitting: 12.7  . Smokeless tobacco: Never Used  Substance and Sexual Activity  . Alcohol use: Yes    Alcohol/week: 2.0 - 3.0 standard drinks    Types: 2 - 3 Cans of beer per week    Comment: occasional- weekly  . Drug use: No  . Sexual activity: Not Currently

## 2018-08-02 DIAGNOSIS — I25119 Atherosclerotic heart disease of native coronary artery with unspecified angina pectoris: Secondary | ICD-10-CM | POA: Diagnosis not present

## 2018-08-02 DIAGNOSIS — I429 Cardiomyopathy, unspecified: Secondary | ICD-10-CM | POA: Diagnosis not present

## 2018-08-02 DIAGNOSIS — I739 Peripheral vascular disease, unspecified: Secondary | ICD-10-CM | POA: Diagnosis not present

## 2018-08-02 DIAGNOSIS — I5020 Unspecified systolic (congestive) heart failure: Secondary | ICD-10-CM | POA: Diagnosis not present

## 2018-08-02 DIAGNOSIS — I4892 Unspecified atrial flutter: Secondary | ICD-10-CM | POA: Diagnosis not present

## 2018-08-02 DIAGNOSIS — E1159 Type 2 diabetes mellitus with other circulatory complications: Secondary | ICD-10-CM | POA: Diagnosis not present

## 2018-08-03 DIAGNOSIS — I739 Peripheral vascular disease, unspecified: Secondary | ICD-10-CM | POA: Diagnosis not present

## 2018-08-03 DIAGNOSIS — I5020 Unspecified systolic (congestive) heart failure: Secondary | ICD-10-CM | POA: Diagnosis not present

## 2018-08-03 DIAGNOSIS — E1159 Type 2 diabetes mellitus with other circulatory complications: Secondary | ICD-10-CM | POA: Diagnosis not present

## 2018-08-03 DIAGNOSIS — I25119 Atherosclerotic heart disease of native coronary artery with unspecified angina pectoris: Secondary | ICD-10-CM | POA: Diagnosis not present

## 2018-08-03 DIAGNOSIS — I429 Cardiomyopathy, unspecified: Secondary | ICD-10-CM | POA: Diagnosis not present

## 2018-08-03 DIAGNOSIS — I4892 Unspecified atrial flutter: Secondary | ICD-10-CM | POA: Diagnosis not present

## 2018-08-05 DIAGNOSIS — E1159 Type 2 diabetes mellitus with other circulatory complications: Secondary | ICD-10-CM | POA: Diagnosis not present

## 2018-08-05 DIAGNOSIS — I4892 Unspecified atrial flutter: Secondary | ICD-10-CM | POA: Diagnosis not present

## 2018-08-05 DIAGNOSIS — I739 Peripheral vascular disease, unspecified: Secondary | ICD-10-CM | POA: Diagnosis not present

## 2018-08-05 DIAGNOSIS — I5020 Unspecified systolic (congestive) heart failure: Secondary | ICD-10-CM | POA: Diagnosis not present

## 2018-08-05 DIAGNOSIS — I429 Cardiomyopathy, unspecified: Secondary | ICD-10-CM | POA: Diagnosis not present

## 2018-08-05 DIAGNOSIS — I25119 Atherosclerotic heart disease of native coronary artery with unspecified angina pectoris: Secondary | ICD-10-CM | POA: Diagnosis not present

## 2018-08-08 DIAGNOSIS — I25119 Atherosclerotic heart disease of native coronary artery with unspecified angina pectoris: Secondary | ICD-10-CM | POA: Diagnosis not present

## 2018-08-08 DIAGNOSIS — I5020 Unspecified systolic (congestive) heart failure: Secondary | ICD-10-CM | POA: Diagnosis not present

## 2018-08-08 DIAGNOSIS — I429 Cardiomyopathy, unspecified: Secondary | ICD-10-CM | POA: Diagnosis not present

## 2018-08-08 DIAGNOSIS — E1159 Type 2 diabetes mellitus with other circulatory complications: Secondary | ICD-10-CM | POA: Diagnosis not present

## 2018-08-08 DIAGNOSIS — I4892 Unspecified atrial flutter: Secondary | ICD-10-CM | POA: Diagnosis not present

## 2018-08-08 DIAGNOSIS — I739 Peripheral vascular disease, unspecified: Secondary | ICD-10-CM | POA: Diagnosis not present

## 2018-08-10 DIAGNOSIS — I4892 Unspecified atrial flutter: Secondary | ICD-10-CM | POA: Diagnosis not present

## 2018-08-10 DIAGNOSIS — E1159 Type 2 diabetes mellitus with other circulatory complications: Secondary | ICD-10-CM | POA: Diagnosis not present

## 2018-08-10 DIAGNOSIS — I739 Peripheral vascular disease, unspecified: Secondary | ICD-10-CM | POA: Diagnosis not present

## 2018-08-10 DIAGNOSIS — I429 Cardiomyopathy, unspecified: Secondary | ICD-10-CM | POA: Diagnosis not present

## 2018-08-10 DIAGNOSIS — I25119 Atherosclerotic heart disease of native coronary artery with unspecified angina pectoris: Secondary | ICD-10-CM | POA: Diagnosis not present

## 2018-08-10 DIAGNOSIS — I5020 Unspecified systolic (congestive) heart failure: Secondary | ICD-10-CM | POA: Diagnosis not present

## 2018-08-12 DIAGNOSIS — I5020 Unspecified systolic (congestive) heart failure: Secondary | ICD-10-CM | POA: Diagnosis not present

## 2018-08-12 DIAGNOSIS — I4892 Unspecified atrial flutter: Secondary | ICD-10-CM | POA: Diagnosis not present

## 2018-08-12 DIAGNOSIS — I429 Cardiomyopathy, unspecified: Secondary | ICD-10-CM | POA: Diagnosis not present

## 2018-08-12 DIAGNOSIS — I739 Peripheral vascular disease, unspecified: Secondary | ICD-10-CM | POA: Diagnosis not present

## 2018-08-12 DIAGNOSIS — I25119 Atherosclerotic heart disease of native coronary artery with unspecified angina pectoris: Secondary | ICD-10-CM | POA: Diagnosis not present

## 2018-08-12 DIAGNOSIS — E1159 Type 2 diabetes mellitus with other circulatory complications: Secondary | ICD-10-CM | POA: Diagnosis not present

## 2018-08-15 DIAGNOSIS — I5020 Unspecified systolic (congestive) heart failure: Secondary | ICD-10-CM | POA: Diagnosis not present

## 2018-08-15 DIAGNOSIS — I25119 Atherosclerotic heart disease of native coronary artery with unspecified angina pectoris: Secondary | ICD-10-CM | POA: Diagnosis not present

## 2018-08-15 DIAGNOSIS — E1159 Type 2 diabetes mellitus with other circulatory complications: Secondary | ICD-10-CM | POA: Diagnosis not present

## 2018-08-15 DIAGNOSIS — I429 Cardiomyopathy, unspecified: Secondary | ICD-10-CM | POA: Diagnosis not present

## 2018-08-15 DIAGNOSIS — I4892 Unspecified atrial flutter: Secondary | ICD-10-CM | POA: Diagnosis not present

## 2018-08-15 DIAGNOSIS — I739 Peripheral vascular disease, unspecified: Secondary | ICD-10-CM | POA: Diagnosis not present

## 2018-08-17 DIAGNOSIS — I4892 Unspecified atrial flutter: Secondary | ICD-10-CM | POA: Diagnosis not present

## 2018-08-17 DIAGNOSIS — I25119 Atherosclerotic heart disease of native coronary artery with unspecified angina pectoris: Secondary | ICD-10-CM | POA: Diagnosis not present

## 2018-08-17 DIAGNOSIS — E1159 Type 2 diabetes mellitus with other circulatory complications: Secondary | ICD-10-CM | POA: Diagnosis not present

## 2018-08-17 DIAGNOSIS — I429 Cardiomyopathy, unspecified: Secondary | ICD-10-CM | POA: Diagnosis not present

## 2018-08-17 DIAGNOSIS — I5020 Unspecified systolic (congestive) heart failure: Secondary | ICD-10-CM | POA: Diagnosis not present

## 2018-08-17 DIAGNOSIS — I739 Peripheral vascular disease, unspecified: Secondary | ICD-10-CM | POA: Diagnosis not present

## 2018-08-19 DIAGNOSIS — E1159 Type 2 diabetes mellitus with other circulatory complications: Secondary | ICD-10-CM | POA: Diagnosis not present

## 2018-08-19 DIAGNOSIS — I739 Peripheral vascular disease, unspecified: Secondary | ICD-10-CM | POA: Diagnosis not present

## 2018-08-19 DIAGNOSIS — I5020 Unspecified systolic (congestive) heart failure: Secondary | ICD-10-CM | POA: Diagnosis not present

## 2018-08-19 DIAGNOSIS — D649 Anemia, unspecified: Secondary | ICD-10-CM | POA: Diagnosis not present

## 2018-08-19 DIAGNOSIS — I5030 Unspecified diastolic (congestive) heart failure: Secondary | ICD-10-CM | POA: Diagnosis not present

## 2018-08-19 DIAGNOSIS — I25119 Atherosclerotic heart disease of native coronary artery with unspecified angina pectoris: Secondary | ICD-10-CM | POA: Diagnosis not present

## 2018-08-19 DIAGNOSIS — I4892 Unspecified atrial flutter: Secondary | ICD-10-CM | POA: Diagnosis not present

## 2018-08-19 DIAGNOSIS — E039 Hypothyroidism, unspecified: Secondary | ICD-10-CM | POA: Diagnosis not present

## 2018-08-19 DIAGNOSIS — I429 Cardiomyopathy, unspecified: Secondary | ICD-10-CM | POA: Diagnosis not present

## 2018-08-23 DIAGNOSIS — I129 Hypertensive chronic kidney disease with stage 1 through stage 4 chronic kidney disease, or unspecified chronic kidney disease: Secondary | ICD-10-CM | POA: Diagnosis not present

## 2018-08-23 DIAGNOSIS — N183 Chronic kidney disease, stage 3 (moderate): Secondary | ICD-10-CM | POA: Diagnosis not present

## 2018-08-23 DIAGNOSIS — D500 Iron deficiency anemia secondary to blood loss (chronic): Secondary | ICD-10-CM | POA: Diagnosis not present

## 2018-08-23 DIAGNOSIS — Z8719 Personal history of other diseases of the digestive system: Secondary | ICD-10-CM | POA: Diagnosis not present

## 2018-08-23 DIAGNOSIS — E039 Hypothyroidism, unspecified: Secondary | ICD-10-CM | POA: Diagnosis not present

## 2018-08-23 DIAGNOSIS — I272 Pulmonary hypertension, unspecified: Secondary | ICD-10-CM | POA: Diagnosis not present

## 2018-08-23 DIAGNOSIS — I2510 Atherosclerotic heart disease of native coronary artery without angina pectoris: Secondary | ICD-10-CM | POA: Diagnosis not present

## 2018-08-23 DIAGNOSIS — I5040 Unspecified combined systolic (congestive) and diastolic (congestive) heart failure: Secondary | ICD-10-CM | POA: Diagnosis not present

## 2018-08-23 DIAGNOSIS — R278 Other lack of coordination: Secondary | ICD-10-CM | POA: Diagnosis not present

## 2018-08-24 DIAGNOSIS — E1159 Type 2 diabetes mellitus with other circulatory complications: Secondary | ICD-10-CM | POA: Diagnosis not present

## 2018-08-24 DIAGNOSIS — I429 Cardiomyopathy, unspecified: Secondary | ICD-10-CM | POA: Diagnosis not present

## 2018-08-24 DIAGNOSIS — N390 Urinary tract infection, site not specified: Secondary | ICD-10-CM | POA: Diagnosis not present

## 2018-08-24 DIAGNOSIS — I4892 Unspecified atrial flutter: Secondary | ICD-10-CM | POA: Diagnosis not present

## 2018-08-24 DIAGNOSIS — I739 Peripheral vascular disease, unspecified: Secondary | ICD-10-CM | POA: Diagnosis not present

## 2018-08-24 DIAGNOSIS — K922 Gastrointestinal hemorrhage, unspecified: Secondary | ICD-10-CM | POA: Diagnosis not present

## 2018-08-24 DIAGNOSIS — E785 Hyperlipidemia, unspecified: Secondary | ICD-10-CM | POA: Diagnosis not present

## 2018-08-24 DIAGNOSIS — I5020 Unspecified systolic (congestive) heart failure: Secondary | ICD-10-CM | POA: Diagnosis not present

## 2018-08-24 DIAGNOSIS — I25119 Atherosclerotic heart disease of native coronary artery with unspecified angina pectoris: Secondary | ICD-10-CM | POA: Diagnosis not present

## 2018-08-24 DIAGNOSIS — N184 Chronic kidney disease, stage 4 (severe): Secondary | ICD-10-CM | POA: Diagnosis not present

## 2018-09-01 DIAGNOSIS — I4892 Unspecified atrial flutter: Secondary | ICD-10-CM | POA: Diagnosis not present

## 2018-09-01 DIAGNOSIS — I5020 Unspecified systolic (congestive) heart failure: Secondary | ICD-10-CM | POA: Diagnosis not present

## 2018-09-01 DIAGNOSIS — I25119 Atherosclerotic heart disease of native coronary artery with unspecified angina pectoris: Secondary | ICD-10-CM | POA: Diagnosis not present

## 2018-09-01 DIAGNOSIS — I739 Peripheral vascular disease, unspecified: Secondary | ICD-10-CM | POA: Diagnosis not present

## 2018-09-01 DIAGNOSIS — I429 Cardiomyopathy, unspecified: Secondary | ICD-10-CM | POA: Diagnosis not present

## 2018-09-01 DIAGNOSIS — E1159 Type 2 diabetes mellitus with other circulatory complications: Secondary | ICD-10-CM | POA: Diagnosis not present

## 2018-09-15 DIAGNOSIS — D500 Iron deficiency anemia secondary to blood loss (chronic): Secondary | ICD-10-CM | POA: Diagnosis not present

## 2018-09-15 DIAGNOSIS — I5040 Unspecified combined systolic (congestive) and diastolic (congestive) heart failure: Secondary | ICD-10-CM | POA: Diagnosis not present

## 2018-09-20 DIAGNOSIS — E1151 Type 2 diabetes mellitus with diabetic peripheral angiopathy without gangrene: Secondary | ICD-10-CM | POA: Diagnosis not present

## 2018-09-20 DIAGNOSIS — I272 Pulmonary hypertension, unspecified: Secondary | ICD-10-CM | POA: Diagnosis not present

## 2018-09-20 DIAGNOSIS — I129 Hypertensive chronic kidney disease with stage 1 through stage 4 chronic kidney disease, or unspecified chronic kidney disease: Secondary | ICD-10-CM | POA: Diagnosis not present

## 2018-09-20 DIAGNOSIS — I480 Paroxysmal atrial fibrillation: Secondary | ICD-10-CM | POA: Diagnosis not present

## 2018-09-20 DIAGNOSIS — I5040 Unspecified combined systolic (congestive) and diastolic (congestive) heart failure: Secondary | ICD-10-CM | POA: Diagnosis not present

## 2018-09-20 DIAGNOSIS — E1129 Type 2 diabetes mellitus with other diabetic kidney complication: Secondary | ICD-10-CM | POA: Diagnosis not present

## 2018-09-20 DIAGNOSIS — R278 Other lack of coordination: Secondary | ICD-10-CM | POA: Diagnosis not present

## 2018-09-20 DIAGNOSIS — E1142 Type 2 diabetes mellitus with diabetic polyneuropathy: Secondary | ICD-10-CM | POA: Diagnosis not present

## 2018-09-23 DIAGNOSIS — I130 Hypertensive heart and chronic kidney disease with heart failure and stage 1 through stage 4 chronic kidney disease, or unspecified chronic kidney disease: Secondary | ICD-10-CM | POA: Diagnosis not present

## 2018-09-23 DIAGNOSIS — E1151 Type 2 diabetes mellitus with diabetic peripheral angiopathy without gangrene: Secondary | ICD-10-CM | POA: Diagnosis not present

## 2018-09-23 DIAGNOSIS — E1122 Type 2 diabetes mellitus with diabetic chronic kidney disease: Secondary | ICD-10-CM | POA: Diagnosis not present

## 2018-09-23 DIAGNOSIS — I2510 Atherosclerotic heart disease of native coronary artery without angina pectoris: Secondary | ICD-10-CM | POA: Diagnosis not present

## 2018-09-23 DIAGNOSIS — N184 Chronic kidney disease, stage 4 (severe): Secondary | ICD-10-CM | POA: Diagnosis not present

## 2018-09-23 DIAGNOSIS — K922 Gastrointestinal hemorrhage, unspecified: Secondary | ICD-10-CM | POA: Diagnosis not present

## 2018-09-23 DIAGNOSIS — I480 Paroxysmal atrial fibrillation: Secondary | ICD-10-CM | POA: Diagnosis not present

## 2018-09-23 DIAGNOSIS — I255 Ischemic cardiomyopathy: Secondary | ICD-10-CM | POA: Diagnosis not present

## 2018-09-23 DIAGNOSIS — I5020 Unspecified systolic (congestive) heart failure: Secondary | ICD-10-CM | POA: Diagnosis not present

## 2018-09-23 DIAGNOSIS — E785 Hyperlipidemia, unspecified: Secondary | ICD-10-CM | POA: Diagnosis not present

## 2018-09-24 DIAGNOSIS — I5020 Unspecified systolic (congestive) heart failure: Secondary | ICD-10-CM | POA: Diagnosis not present

## 2018-09-24 DIAGNOSIS — I255 Ischemic cardiomyopathy: Secondary | ICD-10-CM | POA: Diagnosis not present

## 2018-09-24 DIAGNOSIS — I2510 Atherosclerotic heart disease of native coronary artery without angina pectoris: Secondary | ICD-10-CM | POA: Diagnosis not present

## 2018-09-24 DIAGNOSIS — I130 Hypertensive heart and chronic kidney disease with heart failure and stage 1 through stage 4 chronic kidney disease, or unspecified chronic kidney disease: Secondary | ICD-10-CM | POA: Diagnosis not present

## 2018-09-24 DIAGNOSIS — N184 Chronic kidney disease, stage 4 (severe): Secondary | ICD-10-CM | POA: Diagnosis not present

## 2018-09-24 DIAGNOSIS — E1122 Type 2 diabetes mellitus with diabetic chronic kidney disease: Secondary | ICD-10-CM | POA: Diagnosis not present

## 2018-10-03 ENCOUNTER — Ambulatory Visit: Payer: Self-pay | Admitting: Physician Assistant

## 2018-10-03 ENCOUNTER — Ambulatory Visit (INDEPENDENT_AMBULATORY_CARE_PROVIDER_SITE_OTHER): Payer: Medicare HMO | Admitting: Orthopedic Surgery

## 2018-10-05 ENCOUNTER — Ambulatory Visit: Payer: Medicare HMO | Admitting: Family

## 2018-10-14 DIAGNOSIS — N184 Chronic kidney disease, stage 4 (severe): Secondary | ICD-10-CM | POA: Diagnosis not present

## 2018-10-14 DIAGNOSIS — I130 Hypertensive heart and chronic kidney disease with heart failure and stage 1 through stage 4 chronic kidney disease, or unspecified chronic kidney disease: Secondary | ICD-10-CM | POA: Diagnosis not present

## 2018-10-14 DIAGNOSIS — I255 Ischemic cardiomyopathy: Secondary | ICD-10-CM | POA: Diagnosis not present

## 2018-10-14 DIAGNOSIS — I5020 Unspecified systolic (congestive) heart failure: Secondary | ICD-10-CM | POA: Diagnosis not present

## 2018-10-14 DIAGNOSIS — I2510 Atherosclerotic heart disease of native coronary artery without angina pectoris: Secondary | ICD-10-CM | POA: Diagnosis not present

## 2018-10-14 DIAGNOSIS — E1122 Type 2 diabetes mellitus with diabetic chronic kidney disease: Secondary | ICD-10-CM | POA: Diagnosis not present

## 2018-10-18 DIAGNOSIS — I5020 Unspecified systolic (congestive) heart failure: Secondary | ICD-10-CM | POA: Diagnosis not present

## 2018-10-18 DIAGNOSIS — N184 Chronic kidney disease, stage 4 (severe): Secondary | ICD-10-CM | POA: Diagnosis not present

## 2018-10-18 DIAGNOSIS — I130 Hypertensive heart and chronic kidney disease with heart failure and stage 1 through stage 4 chronic kidney disease, or unspecified chronic kidney disease: Secondary | ICD-10-CM | POA: Diagnosis not present

## 2018-10-18 DIAGNOSIS — I255 Ischemic cardiomyopathy: Secondary | ICD-10-CM | POA: Diagnosis not present

## 2018-10-18 DIAGNOSIS — I2510 Atherosclerotic heart disease of native coronary artery without angina pectoris: Secondary | ICD-10-CM | POA: Diagnosis not present

## 2018-10-18 DIAGNOSIS — E1122 Type 2 diabetes mellitus with diabetic chronic kidney disease: Secondary | ICD-10-CM | POA: Diagnosis not present

## 2018-10-24 DIAGNOSIS — I2510 Atherosclerotic heart disease of native coronary artery without angina pectoris: Secondary | ICD-10-CM | POA: Diagnosis not present

## 2018-10-24 DIAGNOSIS — N3946 Mixed incontinence: Secondary | ICD-10-CM | POA: Diagnosis not present

## 2018-10-24 DIAGNOSIS — E785 Hyperlipidemia, unspecified: Secondary | ICD-10-CM | POA: Diagnosis not present

## 2018-10-24 DIAGNOSIS — E1122 Type 2 diabetes mellitus with diabetic chronic kidney disease: Secondary | ICD-10-CM | POA: Diagnosis not present

## 2018-10-24 DIAGNOSIS — K922 Gastrointestinal hemorrhage, unspecified: Secondary | ICD-10-CM | POA: Diagnosis not present

## 2018-10-24 DIAGNOSIS — E1142 Type 2 diabetes mellitus with diabetic polyneuropathy: Secondary | ICD-10-CM | POA: Diagnosis not present

## 2018-10-24 DIAGNOSIS — I130 Hypertensive heart and chronic kidney disease with heart failure and stage 1 through stage 4 chronic kidney disease, or unspecified chronic kidney disease: Secondary | ICD-10-CM | POA: Diagnosis not present

## 2018-10-24 DIAGNOSIS — I255 Ischemic cardiomyopathy: Secondary | ICD-10-CM | POA: Diagnosis not present

## 2018-10-24 DIAGNOSIS — I5040 Unspecified combined systolic (congestive) and diastolic (congestive) heart failure: Secondary | ICD-10-CM | POA: Diagnosis not present

## 2018-10-24 DIAGNOSIS — E1151 Type 2 diabetes mellitus with diabetic peripheral angiopathy without gangrene: Secondary | ICD-10-CM | POA: Diagnosis not present

## 2018-10-24 DIAGNOSIS — N184 Chronic kidney disease, stage 4 (severe): Secondary | ICD-10-CM | POA: Diagnosis not present

## 2018-10-24 DIAGNOSIS — I5020 Unspecified systolic (congestive) heart failure: Secondary | ICD-10-CM | POA: Diagnosis not present

## 2018-10-24 DIAGNOSIS — I480 Paroxysmal atrial fibrillation: Secondary | ICD-10-CM | POA: Diagnosis not present

## 2018-11-01 DIAGNOSIS — I255 Ischemic cardiomyopathy: Secondary | ICD-10-CM | POA: Diagnosis not present

## 2018-11-01 DIAGNOSIS — I2510 Atherosclerotic heart disease of native coronary artery without angina pectoris: Secondary | ICD-10-CM | POA: Diagnosis not present

## 2018-11-01 DIAGNOSIS — I130 Hypertensive heart and chronic kidney disease with heart failure and stage 1 through stage 4 chronic kidney disease, or unspecified chronic kidney disease: Secondary | ICD-10-CM | POA: Diagnosis not present

## 2018-11-01 DIAGNOSIS — N184 Chronic kidney disease, stage 4 (severe): Secondary | ICD-10-CM | POA: Diagnosis not present

## 2018-11-01 DIAGNOSIS — I5020 Unspecified systolic (congestive) heart failure: Secondary | ICD-10-CM | POA: Diagnosis not present

## 2018-11-01 DIAGNOSIS — E1122 Type 2 diabetes mellitus with diabetic chronic kidney disease: Secondary | ICD-10-CM | POA: Diagnosis not present

## 2018-11-02 DIAGNOSIS — E876 Hypokalemia: Secondary | ICD-10-CM | POA: Diagnosis not present

## 2018-11-15 DIAGNOSIS — E1122 Type 2 diabetes mellitus with diabetic chronic kidney disease: Secondary | ICD-10-CM | POA: Diagnosis not present

## 2018-11-15 DIAGNOSIS — I130 Hypertensive heart and chronic kidney disease with heart failure and stage 1 through stage 4 chronic kidney disease, or unspecified chronic kidney disease: Secondary | ICD-10-CM | POA: Diagnosis not present

## 2018-11-15 DIAGNOSIS — I5020 Unspecified systolic (congestive) heart failure: Secondary | ICD-10-CM | POA: Diagnosis not present

## 2018-11-15 DIAGNOSIS — N184 Chronic kidney disease, stage 4 (severe): Secondary | ICD-10-CM | POA: Diagnosis not present

## 2018-11-15 DIAGNOSIS — I2510 Atherosclerotic heart disease of native coronary artery without angina pectoris: Secondary | ICD-10-CM | POA: Diagnosis not present

## 2018-11-15 DIAGNOSIS — I255 Ischemic cardiomyopathy: Secondary | ICD-10-CM | POA: Diagnosis not present

## 2018-11-23 DIAGNOSIS — I2510 Atherosclerotic heart disease of native coronary artery without angina pectoris: Secondary | ICD-10-CM | POA: Diagnosis not present

## 2018-11-23 DIAGNOSIS — I70209 Unspecified atherosclerosis of native arteries of extremities, unspecified extremity: Secondary | ICD-10-CM | POA: Diagnosis not present

## 2018-11-23 DIAGNOSIS — I5020 Unspecified systolic (congestive) heart failure: Secondary | ICD-10-CM | POA: Diagnosis not present

## 2018-11-23 DIAGNOSIS — E785 Hyperlipidemia, unspecified: Secondary | ICD-10-CM | POA: Diagnosis not present

## 2018-11-23 DIAGNOSIS — I130 Hypertensive heart and chronic kidney disease with heart failure and stage 1 through stage 4 chronic kidney disease, or unspecified chronic kidney disease: Secondary | ICD-10-CM | POA: Diagnosis not present

## 2018-11-23 DIAGNOSIS — Z9981 Dependence on supplemental oxygen: Secondary | ICD-10-CM | POA: Diagnosis not present

## 2018-11-23 DIAGNOSIS — I255 Ischemic cardiomyopathy: Secondary | ICD-10-CM | POA: Diagnosis not present

## 2018-11-23 DIAGNOSIS — K922 Gastrointestinal hemorrhage, unspecified: Secondary | ICD-10-CM | POA: Diagnosis not present

## 2018-11-23 DIAGNOSIS — R0601 Orthopnea: Secondary | ICD-10-CM | POA: Diagnosis not present

## 2018-11-23 DIAGNOSIS — E1122 Type 2 diabetes mellitus with diabetic chronic kidney disease: Secondary | ICD-10-CM | POA: Diagnosis not present

## 2018-11-23 DIAGNOSIS — E1151 Type 2 diabetes mellitus with diabetic peripheral angiopathy without gangrene: Secondary | ICD-10-CM | POA: Diagnosis not present

## 2018-11-23 DIAGNOSIS — I480 Paroxysmal atrial fibrillation: Secondary | ICD-10-CM | POA: Diagnosis not present

## 2018-11-23 DIAGNOSIS — N184 Chronic kidney disease, stage 4 (severe): Secondary | ICD-10-CM | POA: Diagnosis not present

## 2018-11-29 DIAGNOSIS — I130 Hypertensive heart and chronic kidney disease with heart failure and stage 1 through stage 4 chronic kidney disease, or unspecified chronic kidney disease: Secondary | ICD-10-CM | POA: Diagnosis not present

## 2018-11-29 DIAGNOSIS — N184 Chronic kidney disease, stage 4 (severe): Secondary | ICD-10-CM | POA: Diagnosis not present

## 2018-11-29 DIAGNOSIS — I5020 Unspecified systolic (congestive) heart failure: Secondary | ICD-10-CM | POA: Diagnosis not present

## 2018-11-29 DIAGNOSIS — I255 Ischemic cardiomyopathy: Secondary | ICD-10-CM | POA: Diagnosis not present

## 2018-11-29 DIAGNOSIS — I2510 Atherosclerotic heart disease of native coronary artery without angina pectoris: Secondary | ICD-10-CM | POA: Diagnosis not present

## 2018-11-29 DIAGNOSIS — E1122 Type 2 diabetes mellitus with diabetic chronic kidney disease: Secondary | ICD-10-CM | POA: Diagnosis not present

## 2018-11-30 DIAGNOSIS — E1122 Type 2 diabetes mellitus with diabetic chronic kidney disease: Secondary | ICD-10-CM | POA: Diagnosis not present

## 2018-11-30 DIAGNOSIS — I5020 Unspecified systolic (congestive) heart failure: Secondary | ICD-10-CM | POA: Diagnosis not present

## 2018-11-30 DIAGNOSIS — I255 Ischemic cardiomyopathy: Secondary | ICD-10-CM | POA: Diagnosis not present

## 2018-11-30 DIAGNOSIS — I130 Hypertensive heart and chronic kidney disease with heart failure and stage 1 through stage 4 chronic kidney disease, or unspecified chronic kidney disease: Secondary | ICD-10-CM | POA: Diagnosis not present

## 2018-11-30 DIAGNOSIS — I2510 Atherosclerotic heart disease of native coronary artery without angina pectoris: Secondary | ICD-10-CM | POA: Diagnosis not present

## 2018-11-30 DIAGNOSIS — N184 Chronic kidney disease, stage 4 (severe): Secondary | ICD-10-CM | POA: Diagnosis not present

## 2018-12-06 DIAGNOSIS — I5020 Unspecified systolic (congestive) heart failure: Secondary | ICD-10-CM | POA: Diagnosis not present

## 2018-12-06 DIAGNOSIS — I255 Ischemic cardiomyopathy: Secondary | ICD-10-CM | POA: Diagnosis not present

## 2018-12-06 DIAGNOSIS — E1122 Type 2 diabetes mellitus with diabetic chronic kidney disease: Secondary | ICD-10-CM | POA: Diagnosis not present

## 2018-12-06 DIAGNOSIS — N184 Chronic kidney disease, stage 4 (severe): Secondary | ICD-10-CM | POA: Diagnosis not present

## 2018-12-06 DIAGNOSIS — I130 Hypertensive heart and chronic kidney disease with heart failure and stage 1 through stage 4 chronic kidney disease, or unspecified chronic kidney disease: Secondary | ICD-10-CM | POA: Diagnosis not present

## 2018-12-06 DIAGNOSIS — I2510 Atherosclerotic heart disease of native coronary artery without angina pectoris: Secondary | ICD-10-CM | POA: Diagnosis not present

## 2018-12-08 DIAGNOSIS — I272 Pulmonary hypertension, unspecified: Secondary | ICD-10-CM | POA: Diagnosis not present

## 2018-12-08 DIAGNOSIS — E1151 Type 2 diabetes mellitus with diabetic peripheral angiopathy without gangrene: Secondary | ICD-10-CM | POA: Diagnosis not present

## 2018-12-08 DIAGNOSIS — I5040 Unspecified combined systolic (congestive) and diastolic (congestive) heart failure: Secondary | ICD-10-CM | POA: Diagnosis not present

## 2018-12-08 DIAGNOSIS — E1122 Type 2 diabetes mellitus with diabetic chronic kidney disease: Secondary | ICD-10-CM | POA: Diagnosis not present

## 2018-12-08 DIAGNOSIS — Z794 Long term (current) use of insulin: Secondary | ICD-10-CM | POA: Diagnosis not present

## 2018-12-16 DIAGNOSIS — I2510 Atherosclerotic heart disease of native coronary artery without angina pectoris: Secondary | ICD-10-CM | POA: Diagnosis not present

## 2018-12-16 DIAGNOSIS — N184 Chronic kidney disease, stage 4 (severe): Secondary | ICD-10-CM | POA: Diagnosis not present

## 2018-12-16 DIAGNOSIS — I5020 Unspecified systolic (congestive) heart failure: Secondary | ICD-10-CM | POA: Diagnosis not present

## 2018-12-16 DIAGNOSIS — E1122 Type 2 diabetes mellitus with diabetic chronic kidney disease: Secondary | ICD-10-CM | POA: Diagnosis not present

## 2018-12-16 DIAGNOSIS — I255 Ischemic cardiomyopathy: Secondary | ICD-10-CM | POA: Diagnosis not present

## 2018-12-16 DIAGNOSIS — I130 Hypertensive heart and chronic kidney disease with heart failure and stage 1 through stage 4 chronic kidney disease, or unspecified chronic kidney disease: Secondary | ICD-10-CM | POA: Diagnosis not present

## 2018-12-19 DIAGNOSIS — I2510 Atherosclerotic heart disease of native coronary artery without angina pectoris: Secondary | ICD-10-CM | POA: Diagnosis not present

## 2018-12-19 DIAGNOSIS — I255 Ischemic cardiomyopathy: Secondary | ICD-10-CM | POA: Diagnosis not present

## 2018-12-19 DIAGNOSIS — I5020 Unspecified systolic (congestive) heart failure: Secondary | ICD-10-CM | POA: Diagnosis not present

## 2018-12-19 DIAGNOSIS — E1122 Type 2 diabetes mellitus with diabetic chronic kidney disease: Secondary | ICD-10-CM | POA: Diagnosis not present

## 2018-12-19 DIAGNOSIS — N184 Chronic kidney disease, stage 4 (severe): Secondary | ICD-10-CM | POA: Diagnosis not present

## 2018-12-19 DIAGNOSIS — I130 Hypertensive heart and chronic kidney disease with heart failure and stage 1 through stage 4 chronic kidney disease, or unspecified chronic kidney disease: Secondary | ICD-10-CM | POA: Diagnosis not present

## 2018-12-20 DIAGNOSIS — R21 Rash and other nonspecific skin eruption: Secondary | ICD-10-CM | POA: Diagnosis not present

## 2018-12-21 ENCOUNTER — Other Ambulatory Visit: Payer: Self-pay

## 2018-12-21 ENCOUNTER — Emergency Department (HOSPITAL_COMMUNITY)

## 2018-12-21 ENCOUNTER — Inpatient Hospital Stay (HOSPITAL_COMMUNITY)
Admission: EM | Admit: 2018-12-21 | Discharge: 2018-12-26 | DRG: 603 | Disposition: A | Discharge status: Hospice | Attending: Internal Medicine | Admitting: Internal Medicine

## 2018-12-21 ENCOUNTER — Encounter (HOSPITAL_COMMUNITY): Payer: Self-pay

## 2018-12-21 DIAGNOSIS — R5381 Other malaise: Secondary | ICD-10-CM | POA: Diagnosis present

## 2018-12-21 DIAGNOSIS — E1151 Type 2 diabetes mellitus with diabetic peripheral angiopathy without gangrene: Secondary | ICD-10-CM | POA: Diagnosis not present

## 2018-12-21 DIAGNOSIS — Z9842 Cataract extraction status, left eye: Secondary | ICD-10-CM | POA: Diagnosis not present

## 2018-12-21 DIAGNOSIS — I255 Ischemic cardiomyopathy: Secondary | ICD-10-CM | POA: Diagnosis not present

## 2018-12-21 DIAGNOSIS — R531 Weakness: Secondary | ICD-10-CM

## 2018-12-21 DIAGNOSIS — E118 Type 2 diabetes mellitus with unspecified complications: Secondary | ICD-10-CM | POA: Diagnosis present

## 2018-12-21 DIAGNOSIS — Z79891 Long term (current) use of opiate analgesic: Secondary | ICD-10-CM

## 2018-12-21 DIAGNOSIS — Z880 Allergy status to penicillin: Secondary | ICD-10-CM

## 2018-12-21 DIAGNOSIS — R52 Pain, unspecified: Secondary | ICD-10-CM | POA: Diagnosis not present

## 2018-12-21 DIAGNOSIS — E1142 Type 2 diabetes mellitus with diabetic polyneuropathy: Secondary | ICD-10-CM | POA: Diagnosis present

## 2018-12-21 DIAGNOSIS — Z66 Do not resuscitate: Secondary | ICD-10-CM | POA: Diagnosis present

## 2018-12-21 DIAGNOSIS — K922 Gastrointestinal hemorrhage, unspecified: Secondary | ICD-10-CM | POA: Diagnosis not present

## 2018-12-21 DIAGNOSIS — E039 Hypothyroidism, unspecified: Secondary | ICD-10-CM | POA: Diagnosis present

## 2018-12-21 DIAGNOSIS — Z888 Allergy status to other drugs, medicaments and biological substances status: Secondary | ICD-10-CM

## 2018-12-21 DIAGNOSIS — B961 Klebsiella pneumoniae [K. pneumoniae] as the cause of diseases classified elsewhere: Secondary | ICD-10-CM | POA: Diagnosis present

## 2018-12-21 DIAGNOSIS — I130 Hypertensive heart and chronic kidney disease with heart failure and stage 1 through stage 4 chronic kidney disease, or unspecified chronic kidney disease: Secondary | ICD-10-CM | POA: Diagnosis not present

## 2018-12-21 DIAGNOSIS — Z209 Contact with and (suspected) exposure to unspecified communicable disease: Secondary | ICD-10-CM | POA: Diagnosis not present

## 2018-12-21 DIAGNOSIS — R0902 Hypoxemia: Secondary | ICD-10-CM | POA: Diagnosis not present

## 2018-12-21 DIAGNOSIS — W07XXXA Fall from chair, initial encounter: Secondary | ICD-10-CM | POA: Diagnosis present

## 2018-12-21 DIAGNOSIS — Z20828 Contact with and (suspected) exposure to other viral communicable diseases: Secondary | ICD-10-CM | POA: Diagnosis not present

## 2018-12-21 DIAGNOSIS — G63 Polyneuropathy in diseases classified elsewhere: Secondary | ICD-10-CM | POA: Diagnosis present

## 2018-12-21 DIAGNOSIS — E785 Hyperlipidemia, unspecified: Secondary | ICD-10-CM | POA: Diagnosis not present

## 2018-12-21 DIAGNOSIS — Z1612 Extended spectrum beta lactamase (ESBL) resistance: Secondary | ICD-10-CM | POA: Diagnosis not present

## 2018-12-21 DIAGNOSIS — D72829 Elevated white blood cell count, unspecified: Secondary | ICD-10-CM | POA: Diagnosis not present

## 2018-12-21 DIAGNOSIS — Z885 Allergy status to narcotic agent status: Secondary | ICD-10-CM

## 2018-12-21 DIAGNOSIS — Z87442 Personal history of urinary calculi: Secondary | ICD-10-CM

## 2018-12-21 DIAGNOSIS — L03116 Cellulitis of left lower limb: Principal | ICD-10-CM | POA: Diagnosis present

## 2018-12-21 DIAGNOSIS — I10 Essential (primary) hypertension: Secondary | ICD-10-CM | POA: Diagnosis present

## 2018-12-21 DIAGNOSIS — I25810 Atherosclerosis of coronary artery bypass graft(s) without angina pectoris: Secondary | ICD-10-CM | POA: Diagnosis present

## 2018-12-21 DIAGNOSIS — E1122 Type 2 diabetes mellitus with diabetic chronic kidney disease: Secondary | ICD-10-CM | POA: Diagnosis not present

## 2018-12-21 DIAGNOSIS — M1990 Unspecified osteoarthritis, unspecified site: Secondary | ICD-10-CM | POA: Diagnosis present

## 2018-12-21 DIAGNOSIS — I129 Hypertensive chronic kidney disease with stage 1 through stage 4 chronic kidney disease, or unspecified chronic kidney disease: Secondary | ICD-10-CM | POA: Diagnosis not present

## 2018-12-21 DIAGNOSIS — Z86010 Personal history of colonic polyps: Secondary | ICD-10-CM

## 2018-12-21 DIAGNOSIS — Z79890 Hormone replacement therapy (postmenopausal): Secondary | ICD-10-CM

## 2018-12-21 DIAGNOSIS — K219 Gastro-esophageal reflux disease without esophagitis: Secondary | ICD-10-CM | POA: Diagnosis present

## 2018-12-21 DIAGNOSIS — I4891 Unspecified atrial fibrillation: Secondary | ICD-10-CM | POA: Diagnosis present

## 2018-12-21 DIAGNOSIS — Z9841 Cataract extraction status, right eye: Secondary | ICD-10-CM

## 2018-12-21 DIAGNOSIS — I5042 Chronic combined systolic (congestive) and diastolic (congestive) heart failure: Secondary | ICD-10-CM | POA: Diagnosis present

## 2018-12-21 DIAGNOSIS — I70209 Unspecified atherosclerosis of native arteries of extremities, unspecified extremity: Secondary | ICD-10-CM | POA: Diagnosis not present

## 2018-12-21 DIAGNOSIS — S299XXA Unspecified injury of thorax, initial encounter: Secondary | ICD-10-CM | POA: Diagnosis not present

## 2018-12-21 DIAGNOSIS — N184 Chronic kidney disease, stage 4 (severe): Secondary | ICD-10-CM

## 2018-12-21 DIAGNOSIS — I5020 Unspecified systolic (congestive) heart failure: Secondary | ICD-10-CM | POA: Diagnosis not present

## 2018-12-21 DIAGNOSIS — Z9981 Dependence on supplemental oxygen: Secondary | ICD-10-CM | POA: Diagnosis not present

## 2018-12-21 DIAGNOSIS — I5043 Acute on chronic combined systolic (congestive) and diastolic (congestive) heart failure: Secondary | ICD-10-CM | POA: Diagnosis not present

## 2018-12-21 DIAGNOSIS — J302 Other seasonal allergic rhinitis: Secondary | ICD-10-CM | POA: Diagnosis present

## 2018-12-21 DIAGNOSIS — I2510 Atherosclerotic heart disease of native coronary artery without angina pectoris: Secondary | ICD-10-CM | POA: Diagnosis not present

## 2018-12-21 DIAGNOSIS — Z951 Presence of aortocoronary bypass graft: Secondary | ICD-10-CM | POA: Diagnosis not present

## 2018-12-21 DIAGNOSIS — N390 Urinary tract infection, site not specified: Secondary | ICD-10-CM | POA: Diagnosis not present

## 2018-12-21 DIAGNOSIS — R0601 Orthopnea: Secondary | ICD-10-CM | POA: Diagnosis not present

## 2018-12-21 DIAGNOSIS — Z7189 Other specified counseling: Secondary | ICD-10-CM

## 2018-12-21 DIAGNOSIS — Z87891 Personal history of nicotine dependence: Secondary | ICD-10-CM

## 2018-12-21 DIAGNOSIS — Z8249 Family history of ischemic heart disease and other diseases of the circulatory system: Secondary | ICD-10-CM

## 2018-12-21 DIAGNOSIS — N183 Chronic kidney disease, stage 3 (moderate): Secondary | ICD-10-CM | POA: Diagnosis present

## 2018-12-21 DIAGNOSIS — Z794 Long term (current) use of insulin: Secondary | ICD-10-CM

## 2018-12-21 DIAGNOSIS — Z79899 Other long term (current) drug therapy: Secondary | ICD-10-CM

## 2018-12-21 DIAGNOSIS — I480 Paroxysmal atrial fibrillation: Secondary | ICD-10-CM | POA: Diagnosis present

## 2018-12-21 DIAGNOSIS — Z9181 History of falling: Secondary | ICD-10-CM

## 2018-12-21 DIAGNOSIS — Z86718 Personal history of other venous thrombosis and embolism: Secondary | ICD-10-CM | POA: Diagnosis not present

## 2018-12-21 LAB — URINALYSIS, ROUTINE W REFLEX MICROSCOPIC
Bilirubin Urine: NEGATIVE
Glucose, UA: NEGATIVE mg/dL
Ketones, ur: NEGATIVE mg/dL
Nitrite: NEGATIVE
Protein, ur: >=300 mg/dL — AB
Specific Gravity, Urine: 1.011 (ref 1.005–1.030)
WBC, UA: >50 WBC/hpf — ABNORMAL HIGH (ref 0–5)
pH: 5 (ref 5.0–8.0)

## 2018-12-21 LAB — CBC
HCT: 36.7 % — ABNORMAL LOW (ref 39.0–52.0)
Hemoglobin: 12.1 g/dL — ABNORMAL LOW (ref 13.0–17.0)
MCH: 33.4 pg (ref 26.0–34.0)
MCHC: 33 g/dL (ref 30.0–36.0)
MCV: 101.4 fL — ABNORMAL HIGH (ref 80.0–100.0)
Platelets: 138 10*3/uL — ABNORMAL LOW (ref 150–400)
RBC: 3.62 MIL/uL — ABNORMAL LOW (ref 4.22–5.81)
RDW: 14.6 % (ref 11.5–15.5)
WBC: 17.4 10*3/uL — ABNORMAL HIGH (ref 4.0–10.5)
nRBC: 0 % (ref 0.0–0.2)

## 2018-12-21 LAB — CBC WITH DIFFERENTIAL/PLATELET
Abs Immature Granulocytes: 0.15 10*3/uL — ABNORMAL HIGH (ref 0.00–0.07)
Basophils Absolute: 0 10*3/uL (ref 0.0–0.1)
Basophils Relative: 0 %
Eosinophils Absolute: 0 10*3/uL (ref 0.0–0.5)
Eosinophils Relative: 0 %
HCT: 37.8 % — ABNORMAL LOW (ref 39.0–52.0)
Hemoglobin: 12.7 g/dL — ABNORMAL LOW (ref 13.0–17.0)
Immature Granulocytes: 1 %
Lymphocytes Relative: 3 %
Lymphs Abs: 0.5 10*3/uL — ABNORMAL LOW (ref 0.7–4.0)
MCH: 34.2 pg — ABNORMAL HIGH (ref 26.0–34.0)
MCHC: 33.6 g/dL (ref 30.0–36.0)
MCV: 101.9 fL — ABNORMAL HIGH (ref 80.0–100.0)
Monocytes Absolute: 0.7 10*3/uL (ref 0.1–1.0)
Monocytes Relative: 4 %
Neutro Abs: 18.1 10*3/uL — ABNORMAL HIGH (ref 1.7–7.7)
Neutrophils Relative %: 92 %
Platelets: 150 10*3/uL (ref 150–400)
RBC: 3.71 MIL/uL — ABNORMAL LOW (ref 4.22–5.81)
RDW: 14.8 % (ref 11.5–15.5)
WBC Morphology: INCREASED
WBC: 19.5 10*3/uL — ABNORMAL HIGH (ref 4.0–10.5)
nRBC: 0 % (ref 0.0–0.2)

## 2018-12-21 LAB — COMPREHENSIVE METABOLIC PANEL
ALT: 36 U/L (ref 0–44)
AST: 42 U/L — ABNORMAL HIGH (ref 15–41)
Albumin: 2.7 g/dL — ABNORMAL LOW (ref 3.5–5.0)
Alkaline Phosphatase: 58 U/L (ref 38–126)
Anion gap: 11 (ref 5–15)
BUN: 60 mg/dL — ABNORMAL HIGH (ref 8–23)
CO2: 27 mmol/L (ref 22–32)
Calcium: 8.4 mg/dL — ABNORMAL LOW (ref 8.9–10.3)
Chloride: 96 mmol/L — ABNORMAL LOW (ref 98–111)
Creatinine, Ser: 2.21 mg/dL — ABNORMAL HIGH (ref 0.61–1.24)
GFR calc Af Amer: 32 mL/min — ABNORMAL LOW (ref >60)
GFR calc non Af Amer: 27 mL/min — ABNORMAL LOW (ref >60)
Glucose, Bld: 196 mg/dL — ABNORMAL HIGH (ref 70–99)
Potassium: 4.1 mmol/L (ref 3.5–5.1)
Sodium: 134 mmol/L — ABNORMAL LOW (ref 135–145)
Total Bilirubin: 1 mg/dL (ref 0.3–1.2)
Total Protein: 6.2 g/dL — ABNORMAL LOW (ref 6.5–8.1)

## 2018-12-21 LAB — SARS CORONAVIRUS 2 BY RT PCR (HOSPITAL ORDER, PERFORMED IN ~~LOC~~ HOSPITAL LAB): SARS Coronavirus 2: NEGATIVE

## 2018-12-21 LAB — CREATININE, SERUM
Creatinine, Ser: 2.23 mg/dL — ABNORMAL HIGH (ref 0.61–1.24)
GFR calc Af Amer: 31 mL/min — ABNORMAL LOW (ref >60)
GFR calc non Af Amer: 27 mL/min — ABNORMAL LOW (ref >60)

## 2018-12-21 LAB — LACTIC ACID, PLASMA: Lactic Acid, Venous: 1.6 mmol/L (ref 0.5–1.9)

## 2018-12-21 MED ORDER — SODIUM CHLORIDE 0.9 % IV SOLN
1.0000 g | Freq: Once | INTRAVENOUS | Status: AC
  Administered 2018-12-21: 1 g via INTRAVENOUS
  Filled 2018-12-21: qty 1

## 2018-12-21 MED ORDER — ENOXAPARIN SODIUM 40 MG/0.4ML ~~LOC~~ SOLN
40.0000 mg | Freq: Every day | SUBCUTANEOUS | Status: DC
  Administered 2018-12-21 – 2018-12-25 (×5): 40 mg via SUBCUTANEOUS
  Filled 2018-12-21 (×5): qty 0.4

## 2018-12-21 MED ORDER — SODIUM CHLORIDE 0.9 % IV SOLN
1.0000 g | Freq: Two times a day (BID) | INTRAVENOUS | Status: DC
  Administered 2018-12-22 – 2018-12-26 (×9): 1 g via INTRAVENOUS
  Filled 2018-12-21 (×10): qty 1

## 2018-12-21 MED ORDER — VANCOMYCIN HCL IN DEXTROSE 1-5 GM/200ML-% IV SOLN
1000.0000 mg | Freq: Once | INTRAVENOUS | Status: AC
  Administered 2018-12-21: 1000 mg via INTRAVENOUS
  Filled 2018-12-21: qty 200

## 2018-12-21 MED ORDER — ONDANSETRON HCL 4 MG PO TABS: 4.0000 mg | ORAL_TABLET | Freq: Four times a day (QID) | ORAL | Status: DC | PRN

## 2018-12-21 MED ORDER — ONDANSETRON HCL 4 MG/2ML IJ SOLN: 4.0000 mg | Freq: Four times a day (QID) | INTRAMUSCULAR | Status: DC | PRN

## 2018-12-21 MED ORDER — SODIUM CHLORIDE 0.9 % IV SOLN
2.0000 g | Freq: Once | INTRAVENOUS | Status: AC
  Administered 2018-12-21: 2 g via INTRAVENOUS
  Filled 2018-12-21: qty 20

## 2018-12-21 MED ORDER — SODIUM CHLORIDE 0.9 % IV SOLN: 250.0000 mL | INTRAVENOUS | Status: DC | PRN

## 2018-12-21 MED ORDER — SODIUM CHLORIDE 0.9% FLUSH
3.0000 mL | Freq: Two times a day (BID) | INTRAVENOUS | Status: DC
  Administered 2018-12-22 – 2018-12-26 (×5): 3 mL via INTRAVENOUS

## 2018-12-21 MED ORDER — SODIUM CHLORIDE 0.9% FLUSH
3.0000 mL | INTRAVENOUS | Status: DC | PRN
  Administered 2018-12-22 – 2018-12-25 (×2): 3 mL via INTRAVENOUS
  Filled 2018-12-21 (×2): qty 3

## 2018-12-21 MED ORDER — VANCOMYCIN HCL IN DEXTROSE 1-5 GM/200ML-% IV SOLN
1000.0000 mg | INTRAVENOUS | Status: DC
  Administered 2018-12-22 – 2018-12-25 (×3): 1000 mg via INTRAVENOUS
  Filled 2018-12-21 (×4): qty 200

## 2018-12-21 NOTE — H&P (Signed)
History and Physical   [name]:[id number] DOB: Nov 13, 1940 DOA: 12/21/2018  Referring MD/NP/PA: Dr. Lajean Saver  PCP: Lavone Orn, MD   Outpatient Specialists: Dr. Conchita Paris, cardiology  Patient coming from: Home  Chief Complaint: Left lower extremity swelling and pain  HPI: [name] is a 78 y.o. male with medical history significant of ischemic cardiomyopathy with severe systolic dysfunction, diabetes, hypertension, hyperlipidemia, history of coronary artery bypass grafting, history of DVT, chronic kidney disease stage III, history of carotid stenosis, polyneuropathy, paroxysmal atrial fibrillation, history of GI bleed with symptomatic anemia who is currently on hospice care for palliative therapy brought in secondary to left lower extremity swelling redness and pain.  Symptoms have been going on for about 6 days.  He has had falls and weakness.  It started as a red spot like a dollar bill size and progressed so far.  It is now covering his left lower extremity all the way to his thigh.  No itching.  Is more warmth.  The right side is not affected.  Today he was transferring from chair to wheelchair when he could not stand and he fell.  Patient came in evaluated and found to have cellulitis.  Urinalysis also indicated possible UTI he is therefore being admitted for further work-up.  He denied any fever or chills no nausea vomiting or diarrhea..  ED Course: Temperature is 98.9 blood pressure 169/77 pulse 67 respiratory rate of 34 oxygen sats 94% on room air.  Sodium is 134 potassium 4.1 chloride 96 CO2 27 glucose 196 BUN is 60 creatinine 2.21 and calcium 8.4, albumin is 2.7 AST 42.  Lactic acid 1.6.  His white count is 19.5 hemoglobin 12.7 and platelet count of 150.  Urinalysis is positive for leukocytes.  Many bacteria.  WBC more than 50.  Chest x-ray showed no active disease Mellick cardiomegaly with vascular congestion.  Patient is being admitted for treatment of UTI as  well as cellulitis.  Review of Systems: As per HPI otherwise 10 point review of systems negative.    Past Medical History:  Diagnosis Date   Arthritis    Atherosclerotic heart disease    Back pain    Bruises easily    takes Pletal daily and ASA 325mg  daily   Cataract immature    right   Chest pain    Colon polyps    Complication of anesthesia    hard to wake up with bypass surgery   Cyst    on perineum;takes Doxycycline daily   Diabetes mellitus    takes Glipizide,Metformin,and Actos daily   Diarrhea    Dyspnea    when lying flat   Edema    right leg knee down swollen since fall 3 wks ago   Foot drop, left    GERD (gastroesophageal reflux disease)    Rolaids as needed-occ reflux   Hemorrhoids    History of kidney stones    at age 61    Hyperlipidemia    takes Tricor and Lipitor daily   Hypertension    takes Altace,Amlodipine,and Nadolol daily   Hypothyroidism    Kidney stone    35+yrs ago   Muscle pain    Nasal polyps    hx of   Peripheral edema    wears knee length hose-told by podiatrist to wear these   Peripheral neuropathy    takes Gabapentin daily   Pneumonia    as a child   PVD (peripheral vascular disease) ([place])  Renal insufficiency    Seasonal allergies    takes Mucinex and Zyrtec prn   Urinary frequency    urgency/takes Vesicare daily    Past Surgical History:  Procedure Laterality Date   Aortobifemoral bypass     APPLICATION OF WOUND VAC Left 10/25/2017   Procedure: Wound vac placement to left groin.;  Surgeon: Elam Dutch, MD;  Location: [place];  Service: Vascular;  Laterality: Left;   APPLICATION OF WOUND VAC Left 11/10/2017   Procedure: APPLICATION OF WOUND VAC;  Surgeon: Elam Dutch, MD;  Location: [place];  Service: Vascular;  Laterality: Left;   CARDIAC CATHETERIZATION  most recent 05/2011   total of 4   CARDIOVERSION N/A 03/22/2018   Procedure: CARDIOVERSION;  Surgeon: Jerline Pain, MD;   Location: [place] ENDOSCOPY;  Service: Cardiovascular;  Laterality: N/A;   CAROTID ANGIOGRAM N/A 06/05/2011   Procedure: CAROTID Cyril Loosen;  Surgeon: Elam Dutch, MD;  Location: [facility] CATH LAB;  Service: Cardiovascular;  Laterality: N/A;   CAROTID ENDARTERECTOMY  04/08/2011   left CEA   cataract surgery Bilateral    left   COLONOSCOPY     COLONOSCOPY WITH PROPOFOL N/A 08/22/2013   Procedure: COLONOSCOPY WITH PROPOFOL;  Surgeon: Garlan Fair, MD;  Location: [facility] ENDOSCOPY;  Service: Endoscopy;  Laterality: N/A;   CORONARY ARTERY BYPASS GRAFT  09/09/2011   Procedure: CORONARY ARTERY BYPASS GRAFTING (CABG);  Surgeon: Gaye Pollack, MD;  Location: [place];  Service: Open Heart Surgery;  Laterality: N/A;  Coronary artery bypass grafting  x three with Right saphenous vein harvested endoscopically and left internal mammary artery   ENDARTERECTOMY  04/08/2011   Procedure: ENDARTERECTOMY CAROTID;  Surgeon: Elam Dutch, MD;  Location: [place];  Service: Vascular;  Laterality: Left;  with patch angioplasty   ENDARTERECTOMY  09/09/2011   Procedure: ENDARTERECTOMY CAROTID;  Surgeon: Elam Dutch, MD;  Location[facility] OR;  Service: Vascular;  Laterality: Right;  with patch angioplasty   ESOPHAGOGASTRODUODENOSCOPY (EGD) WITH PROPOFOL N/A 03/10/2018   Procedure: ESOPHAGOGASTRODUODENOSCOPY (EGD) WITH PROPOFOL;  Surgeon: Arta Silence, MD;  Location: [facility] ENDOSCOPY;  Service: Endoscopy;  Laterality: N/A;   FEMORAL-POPLITEAL BYPASS GRAFT Left 10/25/2017   Procedure: Left Common Femoral Endarterectomy and perfundaplasty, Left BYPASS GRAFT FEMORAL-BELOW KNEE POPLITEAL ARTERY.;  Surgeon: Elam Dutch, MD;  Location: [facility] OR;  Service: Vascular;  Laterality: Left;   GROIN DEBRIDEMENT Left 11/10/2017   Procedure: Virl Son DEBRIDEMENT;  Surgeon: Elam Dutch, MD;  Location: [facility] OR;  Service: Vascular;  Laterality: Left;   LEFT HEART CATHETERIZATION WITH CORONARY ANGIOGRAM N/A 07/09/2011   Procedure: LEFT  HEART CATHETERIZATION WITH CORONARY ANGIOGRAM;  Surgeon: Sinclair Grooms, MD;  Location: [facility] CATH LAB;  Service: Cardiovascular;  Laterality: N/A;   LOWER EXTREMITY ANGIOGRAPHY N/A 10/11/2017   Procedure: LOWER EXTREMITY ANGIOGRAPHY;  Surgeon: Waynetta Sandy, MD;  Location: [place] CV LAB;  Service: Cardiovascular;  Laterality: N/A;   Reimplantation of inferior mesenteric artery     Repair of infrarenal abdominal aortic aneurysm     RIGHT HEART CATH N/A 04/04/2018   Procedure: RIGHT HEART CATH;  Surgeon: Larey Dresser, MD;  Location: [place] CV LAB;  Service: Cardiovascular;  Laterality: N/A;   SHOULDER ARTHROSCOPY W/ ROTATOR CUFF REPAIR     right and left-open procedures   TEE WITHOUT CARDIOVERSION N/A 03/22/2018   Procedure: TRANSESOPHAGEAL ECHOCARDIOGRAM (TEE);  Surgeon: Jerline Pain, MD;  Location: [facility] ENDOSCOPY;  Service: Cardiovascular;  Laterality: N/A;  TONSILLECTOMY     at age 66   [place] Right 06/28/2014   Procedure: RIGHT LONG TRIGGER RELEASE ;  Surgeon: Leanora Cover, MD;  Location: [place];  Service: Orthopedics;  Laterality: Right;   wisdom teeth extracted     as a teenager   WOUND DEBRIDEMENT Left 11/05/2017   Procedure: DEBRIDEMENT WOUND LEFT GROIN with wound vac placement;  Surgeon: Elam Dutch, MD;  Location: [facility] OR;  Service: Vascular;  Laterality: Left;     reports that he quit smoking about 13 years ago. His smoking use included cigarettes. He quit after 40.00 years of use. He has never used smokeless tobacco. He reports current alcohol use of about 2.0 - 3.0 standard drinks of alcohol per week. He reports that he does not use drugs.  Allergies  Allergen Reactions   Amoxicillin-Pot Clavulanate Nausea Only    Has patient had a PCN reaction causing immediate rash, facial/tongue/throat swelling, SOB or lightheadedness with hypotension: No Has patient had a PCN reaction causing severe rash involving mucus  membranes or skin necrosis: No Has patient had a PCN reaction that required hospitalization: No Has patient had a PCN reaction occurring within the last 10 years: No If all of the above answers are "NO", then may proceed with Cephalosporin use.    Morphine And Related Other (See Comments)    hallucinations   Percocet [Oxycodone-Acetaminophen] Other (See Comments)    Agitation and Increased Confusion. Family asks to please not order this medication for him.    Restoril [Temazepam] Other (See Comments)    Confusion and Agitation. Has taken in past, but has not reacted well. Family requests to not give this mediation.     Family History  Problem Relation Age of Onset   Heart disease Mother    Heart disease Father    Anesthesia problems Neg Hx    Hypotension Neg Hx    Malignant hyperthermia Neg Hx    Pseudochol deficiency Neg Hx      Prior to Admission medications   Medication Sig Start Date End Date Taking? Authorizing Provider  acetaminophen (TYLENOL) 500 MG tablet Take 1,000 mg by mouth every 6 (six) hours as needed for fever or headache (pain).    Yes [provider]  amiodarone (PACERONE) 200 MG tablet Take 1 tablet (200 mg total) by mouth 2 (two) times daily. Patient taking differently: Take 200 mg by mouth every evening.  03/17/18  Yes Isaiah Serge, NP  atorvastatin (LIPITOR) 20 MG tablet Take 20 mg by mouth daily after supper.    Yes [provider]  carvedilol (COREG) 3.125 MG tablet Take 1 tablet (3.125 mg total) by mouth 2 (two) times daily. 04/06/18  Yes Purohit, Konrad Dolores, MD  ferrous sulfate 325 (65 FE) MG tablet Take 1 tablet (325 mg total) by mouth 2 (two) times daily with a meal. Patient taking differently: Take 325 mg by mouth daily with breakfast.  04/17/18  Yes Thurnell Lose, MD  hydrALAZINE (APRESOLINE) 25 MG tablet Take 25 mg by mouth 3 (three) times daily.   Yes [provider]  HYDROcodone-acetaminophen (NORCO/VICODIN)  5-325 MG tablet Take 1 tablet by mouth every 6 (six) hours as needed for moderate pain or severe pain.   Yes [provider]  isosorbide mononitrate (IMDUR) 30 MG 24 hr tablet Take 30 mg by mouth daily.   Yes [provider]  levothyroxine (SYNTHROID) 112 MCG tablet Take 112 mcg by mouth daily before  breakfast.  09/12/18  Yes [provider]  Multiple Vitamin (MULTIVITAMIN WITH MINERALS) TABS tablet Take 1 tablet by mouth daily. Patient taking differently: Take 1 tablet by mouth every morning.  12/03/17  Yes Rhyne, Samantha J, PA-C  MYRBETRIQ 25 MG TB24 tablet Take 25 mg by mouth daily.  12/06/18  Yes [provider]  nitroGLYCERIN (NITROSTAT) 0.4 MG SL tablet PLACE 1 TABLET UNDER TONGUE EVERY 5 MINUTES AS NEEDED FOR CHEST PAIN. Patient taking differently: Place 0.4 mg under the tongue every 5 (five) minutes as needed for chest pain.  09/17/15  Yes Belva Crome, MD  pantoprazole (PROTONIX) 40 MG tablet Take 1 tablet (40 mg total) by mouth daily. Patient taking differently: Take 40 mg by mouth 2 (two) times daily. Take twice daily until 04/26/18 03/11/18 03/11/19 Yes Pokhrel, Laxman, MD  Potassium Chloride ER 20 MEQ TBCR Take 1 tablet by mouth 2 (two) times a day.  10/07/18  Yes [provider]  pseudoephedrine-guaifenesin (MUCINEX D) 60-600 MG per tablet Take 1 tablet by mouth every 12 (twelve) hours as needed for congestion.    Yes [provider]  SENNA S 8.6-50 MG tablet Take 1 tablet by mouth 2 (two) times daily as needed for mild constipation.  09/16/18  Yes [provider]  temazepam (RESTORIL) 15 MG capsule Take 15 mg by mouth at bedtime as needed for sleep.  11/01/18  Yes [provider]  torsemide (DEMADEX) 20 MG tablet Take 60-80 mg by mouth 2 (two) times daily. Take 4 tablets (80 mg) in the morning and Take 3 tablets (60 mg) at noon   Yes [provider]  TOUJEO SOLOSTAR 300 UNIT/ML SOPN Inject 11 Units into the skin  every evening.  12/08/18  Yes [provider]  valACYclovir (VALTREX) 1000 MG tablet Take 1,000 mg by mouth daily.   Yes [provider]  vitamin B-12 (CYANOCOBALAMIN) 500 MCG tablet Take 500 mcg by mouth every morning.    Yes [provider]  doxycycline (VIBRA-TABS) 100 MG tablet Take 1 tablet (100 mg total) by mouth 2 (two) times daily. Patient not taking: Reported on 12/21/2018 07/18/18   Newt Minion, MD  folic acid (FOLVITE) 1 MG tablet Take 1 tablet (1 mg total) by mouth daily. Patient not taking: Reported on 12/21/2018 04/17/18   Thurnell Lose, MD  hydrALAZINE (APRESOLINE) 25 MG tablet Take 1 tablet (25 mg total) by mouth every 8 (eight) hours. 04/06/18 05/06/18  Purohit, Konrad Dolores, MD  isosorbide mononitrate (IMDUR) 30 MG 24 hr tablet Take 1 tablet (30 mg total) by mouth daily. Patient taking differently: Take 30 mg by mouth every morning.  04/07/18 05/07/18  Purohit, Konrad Dolores, MD  LORazepam (ATIVAN) 0.5 MG tablet Take 0.5 mg by mouth at bedtime as needed for anxiety.    [provider]  methylcellulose (ARTIFICIAL TEARS) 1 % ophthalmic solution Place 1 drop into both eyes 2 (two) times daily as needed (dry eyes).    [provider]  metolazone (ZAROXOLYN) 5 MG tablet Take 5 mg by mouth daily as needed (edema).  09/19/18   [provider]  polyethylene glycol powder (GLYCOLAX/MIRALAX) 17 GM/SCOOP powder Take 0.5 Containers by mouth daily as needed for mild constipation.  08/19/18   [provider]  potassium chloride SA (K-DUR,KLOR-CON) 20 MEQ tablet Take by mouth as directed. Patient not taking: Reported on 12/21/2018 03/18/18   Isaiah Serge, NP  tamsulosin (FLOMAX) 0.4 MG CAPS capsule Take 0.4 mg  by mouth every Monday, Wednesday, and Friday.     [provider]  torsemide (DEMADEX) 20 MG tablet Take 4 tablets (80 mg total) by mouth daily with breakfast. Patient taking differently: Take 60-80 mg by mouth See admin  instructions. Take 80mg  in the morning, and 60mg  in the evening 04/07/18 05/07/18  Cristy Folks, MD    Physical Exam: Vitals:   12/21/18 1530 12/21/18 1600 12/21/18 1630 12/21/18 1718  BP: (!) 156/72 (!) 143/60 (!) 150/60   Pulse: 62 60 62   Resp: (!) 22 (!) 22 (!) 25   Temp:      TempSrc:      SpO2: 99% 98% 94%   Weight:    100.7 kg  Height:    5\' 10"  (1.778 m)      Constitutional: Pleasant man, no acute distress Vitals:   12/21/18 1530 12/21/18 1600 12/21/18 1630 12/21/18 1718  BP: (!) 156/72 (!) 143/60 (!) 150/60   Pulse: 62 60 62   Resp: (!) 22 (!) 22 (!) 25   Temp:      TempSrc:      SpO2: 99% 98% 94%   Weight:    100.7 kg  Height:    5\' 10"  (1.778 m)   Eyes: PERRL, lids and conjunctivae normal ENMT: Mucous membranes are moist. Posterior pharynx clear of any exudate or lesions.Normal dentition.  Neck: normal, supple, no masses, no thyromegaly Respiratory: Decreased air entry at the bases with some crackles,. Normal respiratory effort. No accessory muscle use.  Cardiovascular: Regular rate and rhythm, no murmurs / rubs / gallops. No extremity edema. 2+ pedal pulses. No carotid bruits.  Abdomen: no tenderness, no masses palpated. No hepatosplenomegaly. Bowel sounds positive.  Musculoskeletal: no clubbing / cyanosis. No joint deformity upper and lower extremities. Good ROM, no contractures. Normal muscle tone.  Left lower extremities cellulitis with some mild edema Skin: Left lower extremity red warm to touch, between the ankle and his knee, no visible ulcers,  No induration Neurologic: CN 2-12 grossly intact. Sensation intact, DTR normal. Strength 5/5 in all 4.  Psychiatric: Normal judgment and insight. Alert and oriented x 3. Normal mood.     Labs on Admission: I have personally reviewed following labs and imaging studies  CBC: Recent Labs  Lab 12/21/18 1654  WBC 19.5*  NEUTROABS 18.1*  HGB 12.7*  HCT 37.8*  MCV 101.9*  PLT 144   Basic Metabolic  Panel: Recent Labs  Lab 12/21/18 1654  NA 134*  K 4.1  CL 96*  CO2 27  GLUCOSE 196*  BUN 60*  CREATININE 2.21*  CALCIUM 8.4*   GFR: Estimated Creatinine Clearance: 32.2 mL/min (A) (by C-G formula based on SCr of 2.21 mg/dL (H)). Liver Function Tests: Recent Labs  Lab 12/21/18 1654  AST 42*  ALT 36  ALKPHOS 58  BILITOT 1.0  PROT 6.2*  ALBUMIN 2.7*   No results for input(s): LIPASE, AMYLASE in the last 168 hours. No results for input(s): AMMONIA in the last 168 hours. Coagulation Profile: No results for input(s): INR, PROTIME in the last 168 hours. Cardiac Enzymes: No results for input(s): CKTOTAL, CKMB, CKMBINDEX, TROPONINI in the last 168 hours. BNP (last 3 results) Recent Labs    02/03/18 1217 02/24/18 1243  PROBNP 10,666* 17,251*   HbA1C: No results for input(s): HGBA1C in the last 72 hours. CBG: No results for input(s): GLUCAP in the last 168 hours. Lipid Profile: No results for input(s): CHOL, HDL, LDLCALC, TRIG, CHOLHDL, LDLDIRECT in the  last 72 hours. Thyroid Function Tests: No results for input(s): TSH, T4TOTAL, FREET4, T3FREE, THYROIDAB in the last 72 hours. Anemia Panel: No results for input(s): VITAMINB12, FOLATE, FERRITIN, TIBC, IRON, RETICCTPCT in the last 72 hours. Urine analysis:    Component Value Date/Time   COLORURINE YELLOW 12/21/2018 1654   APPEARANCEUR CLOUDY (A) 12/21/2018 1654   LABSPEC 1.011 12/21/2018 1654   PHURINE 5.0 12/21/2018 1654   GLUCOSEU NEGATIVE 12/21/2018 1654   HGBUR SMALL (A) 12/21/2018 1654   BILIRUBINUR NEGATIVE 12/21/2018 1654   KETONESUR NEGATIVE 12/21/2018 1654   PROTEINUR >=300 (A) 12/21/2018 1654   UROBILINOGEN 4.0 (H) 09/23/2011 1335   NITRITE NEGATIVE 12/21/2018 1654   LEUKOCYTESUR LARGE (A) 12/21/2018 1654   Sepsis Labs: @LABRCNTIP (procalcitonin:4,lacticidven:4) )No results found for this or any previous visit (from the past 240 hour(s)).   Radiological Exams on Admission: Dg Chest Port 1  View  Result Date: 12/21/2018 CLINICAL DATA:  Fever. Chills. Recent falls. EXAM: PORTABLE CHEST 1 VIEW COMPARISON:  Radiograph April 17, 2018 FINDINGS: Post median sternotomy.The cardiomediastinal contours are unchanged with mild cardiomegaly and aortic atherosclerosis. Vascular congestion without overt pulmonary edema. No consolidation, pleural effusion, or pneumothorax. No acute osseous abnormalities are seen. IMPRESSION: Cardiomegaly with vascular congestion. Electronically Signed   By: Keith Rake M.D.   On: 12/21/2018 18:08    EKG: Independently reviewed.  It shows sinus rhythm with Intra-ventricular conduction delay, prolonged PR interval.  No specific ST changes.  Unchanged from previous  Assessment/Plan Principal Problem:   Left leg cellulitis Active Problems:   S/P CABG (coronary artery bypass graft)   Essential hypertension   Acute on chronic combined systolic and diastolic CHF (congestive heart failure) (HCC)   Polyneuropathy associated with underlying disease ([place])   Coronary artery disease involving coronary bypass graft of native heart without angina pectoris   Atrial fibrillation (HCC)   DNR (do not resuscitate) discussion   Weakness generalized   UTI (urinary tract infection)     #1 left leg cellulitis: Leg is red warm to touch.  Admit the patient and placed on IV vancomycin.  Consider cefepime as well to help with coverage with gram-negative's.  Blood cultures obtained.  Elevate the leg and monitor closely.  #2 UTI: Urine cultures and blood cultures obtained.  Empirically treated with antibiotics.  Monitor and follow culture results.  #3 ischemic cardiomyopathy: Patient appears to be at baseline.  Avoid excessive fluids and continue with home cardiac medications.  #4 paroxysmal atrial fibrillation: Currently in sinus rhythm.  Continue home regimen.  #5 generalized weakness: Due to acute illness.  PT consultation while in-house.  #7 diabetes: Sliding scale  insulin to be initiated.  #8 hypertension: Continue with home regimen.  #9 chronic kidney disease stage II: Continue monitor BUN and creatinine.  Appears to be close to baseline.  #10 leukocytosis: Secondary to acute infection.  Monitor white count with treatment.   DVT prophylaxis: Heparin Code Status: DNR Family Communication: Wife at bedside Disposition Plan: To be determined Consults called: None Admission status: Inpatient  Severity of Illness: The appropriate patient status for this patient is INPATIENT. Inpatient status is judged to be reasonable and necessary in order to provide the required intensity of service to ensure the patient's safety. The patient's presenting symptoms, physical exam findings, and initial radiographic and laboratory data in the context of their chronic comorbidities is felt to place them at high risk for further clinical deterioration. Furthermore, it is not anticipated that the patient will be medically stable for  discharge from the hospital within 2 midnights of admission. The following factors support the patient status of inpatient.   " The patient's presenting symptoms include left lower extremities swelling and redness. " The worrisome physical exam findings include redness of the left lower extremity. " The initial radiographic and laboratory data are worrisome because of evidence of cellulitis and UTI. " The chronic co-morbidities include advanced ischemic cardiomyopathy.   * I certify that at the point of admission it is my clinical judgment that the patient will require inpatient hospital care spanning beyond 2 midnights from the point of admission due to high intensity of service, high risk for further deterioration and high frequency of surveillance required.Barbette Merino MD [facility] Pager [phone]  If 7PM-7AM, please contact night-coverage [email or web address] Password [facility]  12/21/2018, 6:58 PM

## 2018-12-21 NOTE — Progress Notes (Signed)
Pharmacy Antibiotic Note  [name] is a 78 y.o. male admitted on 12/21/2018 with cellulitis and UTI.  Pharmacy has been consulted for meropenem and vancomycin dosing.  Pt has a history of ESBL infection.  Today, 12/21/18  WBC 19.5  SCr 2.21, CrCl ~32 mL/min  Lactate 1.6  Afebrile  Plan:  Meropenem 1 g IV q12h  Vancomycin 1000 mg IV dose given in ED. Will start maintenance dose of 1000 mg IV q36h (stagger dose to make up load)  Goal AUC 400-550  Follow renal function daily for necessary dose adjustment  Check vancomycin levels at steady state if indicated  Follow culture data for ability to de-escalate antibiotics  Height: 5\' 10"  (177.8 cm) Weight: 222 lb (100.7 kg) IBW/kg (Calculated) : 73  Temp (24hrs), Avg:98.9 F (37.2 C), Min:98.9 F (37.2 C), Max:98.9 F (37.2 C)  Recent Labs  Lab 12/21/18 1654  WBC 19.5*  CREATININE 2.21*  LATICACIDVEN 1.6    Estimated Creatinine Clearance: 32.2 mL/min (A) (by C-G formula based on SCr of 2.21 mg/dL (H)).    Allergies  Allergen Reactions  . Amoxicillin-Pot Clavulanate Nausea Only    Has patient had a PCN reaction causing immediate rash, facial/tongue/throat swelling, SOB or lightheadedness with hypotension: No Has patient had a PCN reaction causing severe rash involving mucus membranes or skin necrosis: No Has patient had a PCN reaction that required hospitalization: No Has patient had a PCN reaction occurring within the last 10 years: No If all of the above answers are "NO", then may proceed with Cephalosporin use.   Marland Kitchen Morphine And Related Other (See Comments)    hallucinations  . Percocet [Oxycodone-Acetaminophen] Other (See Comments)    Agitation and Increased Confusion. Family asks to please not order this medication for him.   . Restoril [Temazepam] Other (See Comments)    Confusion and Agitation. Has taken in past, but has not reacted well. Family requests to not give this mediation.      Antimicrobials this admission: vancomycin 7/29 >>  Ceftriaxone in ED 7/29 >>  Meropenem 7/29 >>  Dose adjustments this admission:  Microbiology results: 7/29 BCx: Sent 7/29 UCx: Sent  7/29 SARS-2: Negative  Thank you for allowing pharmacy to be a part of this patient's care.  Lenis Noon, PharmD 12/21/2018 7:52 PM

## 2018-12-21 NOTE — ED Provider Notes (Signed)
[facility] DEPT Provider Note   CSN: [id number] Arrival date & time: 12/21/18  1441    History   Chief Complaint Chief Complaint  Patient presents with  . Extremity Weakness  . Fall    HPI [name] is a 78 y.o. male on hospice care due to severe CHF with significant PMH of CAD, PAD, HTN, hyperlipidemia, a fib, hypothyroidism, and diabetes mellitus who presents to the ED with L lower extremity redness, weakness, and falls. Pt states that 6 days ago he noticed a dollar bill sized red spot on his lower abdomen. The next day redness appeared on the outer aspect of his L thigh. The redness has since spread to his lower calf as well. These areas are all warm and tender to palpation. He endorses fevers and chills at home during this time. Additionally, he fell overnight and hit the top R aspect of his head. Pt states that when he was transferring from his chair to wheelchair both of his legs got progressively weak upon standing and he fell. His hospice nurse this morning placed bandages over the wound. Pt endorses multiple prior falls over the past year in similar situations. Denies headaches, vision changes, or current dizziness/lightheadedness.     Extremity Weakness This is a recurrent problem. The current episode started 6 to 12 hours ago. The problem has been resolved. Pertinent negatives include no chest pain, no abdominal pain, no headaches and no shortness of breath. The symptoms are aggravated by standing and walking. The symptoms are relieved by lying down. He has tried nothing for the symptoms.  Fall This is a recurrent problem. The current episode started 6 to 12 hours ago. The problem has been resolved. Pertinent negatives include no chest pain, no abdominal pain, no headaches and no shortness of breath. Associated symptoms comments: Hit top R side of head on the way down. Exacerbated by: change in position from chair to wheelchair.    Past  Medical History:  Diagnosis Date  . Arthritis   . Atherosclerotic heart disease   . Back pain   . Bruises easily    takes Pletal daily and ASA 325mg  daily  . Cataract immature    right  . Chest pain   . Colon polyps   . Complication of anesthesia    hard to wake up with bypass surgery  . Cyst    on perineum;takes Doxycycline daily  . Diabetes mellitus    takes Glipizide,Metformin,and Actos daily  . Diarrhea   . Dyspnea    when lying flat  . Edema    right leg knee down swollen since fall 3 wks ago  . Foot drop, left   . GERD (gastroesophageal reflux disease)    Rolaids as needed-occ reflux  . Hemorrhoids   . History of kidney stones    at age 57   . Hyperlipidemia    takes Tricor and Lipitor daily  . Hypertension    takes Altace,Amlodipine,and Nadolol daily  . Hypothyroidism   . Kidney stone    35+yrs ago  . Muscle pain   . Nasal polyps    hx of  . Peripheral edema    wears knee length hose-told by podiatrist to wear these  . Peripheral neuropathy    takes Gabapentin daily  . Pneumonia    as a child  . PVD (peripheral vascular disease) ([place])   . Renal insufficiency   . Seasonal allergies    takes Mucinex and Zyrtec prn  .  Urinary frequency    urgency/takes Vesicare daily    Patient Active Problem List   Diagnosis Date Noted  . Acute blood loss anemia 04/16/2018  . Acute on chronic anemia 04/16/2018  . Weakness generalized   . DNR (do not resuscitate) discussion   . Palliative care by specialist   . Transient alteration of awareness   . AKI (acute kidney injury) ([place])   . Acute on chronic congestive heart failure ([place]) 03/18/2018  . Symptomatic anemia 03/08/2018  . Atrial fibrillation ([place]) 03/08/2018  . Hyperlipidemia 03/08/2018  . Chronic back pain 03/08/2018  . Combined systolic and diastolic heart failure ([place]) 03/08/2018  . GI bleed 03/07/2018  . Coronary artery disease involving coronary bypass graft of native heart without angina pectoris   .  Benign essential HTN   . PAD (peripheral artery disease) ([place])   . New onset atrial fibrillation ([place])   . Tachypnea   . Hyponatremia   . Malnutrition of moderate degree 02/27/2018  . Volume overload 11/10/2017  . Surgical wound infection 11/04/2017  . Polyneuropathy associated with underlying disease ([place]) 08/16/2017  . Essential hypertension 02/06/2014  . Acute on chronic combined systolic and diastolic CHF (congestive heart failure) ([place]) 02/06/2014  . Chronic kidney disease, stage III (moderate) ([place]) 02/06/2014  . Aftercare following surgery of the circulatory system, [place] 01/25/2014  . Carotid stenosis 06/08/2013  . Stricture of artery ([place]) 04/14/2012  . Peripheral vascular disease, unspecified ([place]) 10/08/2011  . DVT of upper extremity (deep vein thrombosis), left 09/29/2011  . S/P CABG (coronary artery bypass graft) 09/29/2011  . H/O carotid endarterectomy 09/29/2011  . Occlusion and stenosis of carotid artery without mention of cerebral infarction 06/11/2011  . Diabetes mellitus type 2 with complications ([place]) [id number]/[id number]    Past Surgical History:  Procedure Laterality Date  . Aortobifemoral bypass    . APPLICATION OF WOUND VAC Left 10/25/2017   Procedure: Wound vac placement to left groin.;  Surgeon: Elam Dutch, MD;  Location: [facility] OR;  Service: Vascular;  Laterality: Left;  . APPLICATION OF WOUND VAC Left 11/10/2017   Procedure: APPLICATION OF WOUND VAC;  Surgeon: Elam Dutch, MD;  Location: [place];  Service: Vascular;  Laterality: Left;  . CARDIAC CATHETERIZATION  most recent 05/2011   total of 4  . CARDIOVERSION N/A 03/22/2018   Procedure: CARDIOVERSION;  Surgeon: Jerline Pain, MD;  Location[facility] ENDOSCOPY;  Service: Cardiovascular;  Laterality: N/A;  . CAROTID ANGIOGRAM N/A 06/05/2011   Procedure: CAROTID Cyril Loosen;  Surgeon: Elam Dutch, MD;  Location: [facility] CATH LAB;  Service: Cardiovascular;  Laterality: N/A;  . CAROTID ENDARTERECTOMY  04/08/2011   left  CEA  . cataract surgery Bilateral    left  . COLONOSCOPY    . COLONOSCOPY WITH PROPOFOL N/A 08/22/2013   Procedure: COLONOSCOPY WITH PROPOFOL;  Surgeon: Garlan Fair, MD;  Location: [facility] ENDOSCOPY;  Service: Endoscopy;  Laterality: N/A;  . CORONARY ARTERY BYPASS GRAFT  09/09/2011   Procedure: CORONARY ARTERY BYPASS GRAFTING (CABG);  Surgeon: Gaye Pollack, MD;  Location: [place];  Service: Open Heart Surgery;  Laterality: N/A;  Coronary artery bypass grafting  x three with Right saphenous vein harvested endoscopically and left internal mammary artery  . ENDARTERECTOMY  04/08/2011   Procedure: ENDARTERECTOMY CAROTID;  Surgeon: Elam Dutch, MD;  Location: [facility] OR;  Service: Vascular;  Laterality: Left;  with patch angioplasty  . ENDARTERECTOMY  09/09/2011   Procedure: ENDARTERECTOMY CAROTID;  Surgeon: Elam Dutch, MD;  Location: [facility] OR;  Service: Vascular;  Laterality: Right;  with patch angioplasty  . ESOPHAGOGASTRODUODENOSCOPY (EGD) WITH PROPOFOL N/A 03/10/2018   Procedure: ESOPHAGOGASTRODUODENOSCOPY (EGD) WITH PROPOFOL;  Surgeon: Arta Silence, MD;  Location: [place];  Service: Endoscopy;  Laterality: N/A;  . FEMORAL-POPLITEAL BYPASS GRAFT Left 10/25/2017   Procedure: Left Common Femoral Endarterectomy and perfundaplasty, Left BYPASS GRAFT FEMORAL-BELOW KNEE POPLITEAL ARTERY.;  Surgeon: Elam Dutch, MD;  Location: [place];  Service: Vascular;  Laterality: Left;  . GROIN DEBRIDEMENT Left 11/10/2017   Procedure: GROIN DEBRIDEMENT;  Surgeon: Elam Dutch, MD;  Location: [place];  Service: Vascular;  Laterality: Left;  . LEFT HEART CATHETERIZATION WITH CORONARY ANGIOGRAM N/A 07/09/2011   Procedure: LEFT HEART CATHETERIZATION WITH CORONARY ANGIOGRAM;  Surgeon: Sinclair Grooms, MD;  Location: [facility] CATH LAB;  Service: Cardiovascular;  Laterality: N/A;  . LOWER EXTREMITY ANGIOGRAPHY N/A 10/11/2017   Procedure: LOWER EXTREMITY ANGIOGRAPHY;  Surgeon: Waynetta Sandy, MD;   Location: [place] CV LAB;  Service: Cardiovascular;  Laterality: N/A;  . Reimplantation of inferior mesenteric artery    . Repair of infrarenal abdominal aortic aneurysm    . RIGHT HEART CATH N/A 04/04/2018   Procedure: RIGHT HEART CATH;  Surgeon: Larey Dresser, MD;  Location: [place] CV LAB;  Service: Cardiovascular;  Laterality: N/A;  . SHOULDER ARTHROSCOPY W/ ROTATOR CUFF REPAIR     right and left-open procedures  . TEE WITHOUT CARDIOVERSION N/A 03/22/2018   Procedure: TRANSESOPHAGEAL ECHOCARDIOGRAM (TEE);  Surgeon: Jerline Pain, MD;  Location: [facility] ENDOSCOPY;  Service: Cardiovascular;  Laterality: N/A;  . TONSILLECTOMY     at age 46  . TRIGGER FINGER RELEASE Right 06/28/2014   Procedure: RIGHT LONG TRIGGER RELEASE ;  Surgeon: Leanora Cover, MD;  Location: [place];  Service: Orthopedics;  Laterality: Right;  . wisdom teeth extracted     as a teenager  . WOUND DEBRIDEMENT Left 11/05/2017   Procedure: DEBRIDEMENT WOUND LEFT GROIN with wound vac placement;  Surgeon: Elam Dutch, MD;  Location: [facility] OR;  Service: Vascular;  Laterality: Left;      Home Medications    Prior to Admission medications   Medication Sig Start Date End Date Taking? Authorizing Provider  acetaminophen (TYLENOL) 500 MG tablet Take 1,000 mg by mouth every 6 (six) hours as needed for fever or headache (pain).    Yes [provider]  amiodarone (PACERONE) 200 MG tablet Take 1 tablet (200 mg total) by mouth 2 (two) times daily. Patient taking differently: Take 200 mg by mouth every evening.  03/17/18  Yes Isaiah Serge, NP  atorvastatin (LIPITOR) 20 MG tablet Take 20 mg by mouth daily after supper.    Yes [provider]  carvedilol (COREG) 3.125 MG tablet Take 1 tablet (3.125 mg total) by mouth 2 (two) times daily. 04/06/18  Yes Purohit, Konrad Dolores, MD  ferrous sulfate 325 (65 FE) MG tablet Take 1 tablet (325 mg total) by mouth 2 (two) times daily with a meal. Patient  taking differently: Take 325 mg by mouth daily with breakfast.  04/17/18  Yes Thurnell Lose, MD  hydrALAZINE (APRESOLINE) 25 MG tablet Take 25 mg by mouth 3 (three) times daily.   Yes [provider]  HYDROcodone-acetaminophen (NORCO/VICODIN) 5-325 MG tablet Take 1 tablet by mouth every 6 (six) hours as needed for moderate pain or severe pain.   Yes [provider]  isosorbide mononitrate (IMDUR) 30 MG 24 hr tablet Take 30  mg by mouth daily.   Yes [provider]  levothyroxine (SYNTHROID) 112 MCG tablet Take 112 mcg by mouth daily before breakfast.  09/12/18  Yes [provider]  Multiple Vitamin (MULTIVITAMIN WITH MINERALS) TABS tablet Take 1 tablet by mouth daily. Patient taking differently: Take 1 tablet by mouth every morning.  12/03/17  Yes Rhyne, Samantha J, PA-C  MYRBETRIQ 25 MG TB24 tablet Take 25 mg by mouth daily.  12/06/18  Yes [provider]  nitroGLYCERIN (NITROSTAT) 0.4 MG SL tablet PLACE 1 TABLET UNDER TONGUE EVERY 5 MINUTES AS NEEDED FOR CHEST PAIN. Patient taking differently: Place 0.4 mg under the tongue every 5 (five) minutes as needed for chest pain.  09/17/15  Yes Belva Crome, MD  pantoprazole (PROTONIX) 40 MG tablet Take 1 tablet (40 mg total) by mouth daily. Patient taking differently: Take 40 mg by mouth 2 (two) times daily. Take twice daily until 04/26/18 03/11/18 03/11/19 Yes Pokhrel, Laxman, MD  Potassium Chloride ER 20 MEQ TBCR Take 1 tablet by mouth 2 (two) times a day.  10/07/18  Yes [provider]  pseudoephedrine-guaifenesin (MUCINEX D) 60-600 MG per tablet Take 1 tablet by mouth every 12 (twelve) hours as needed for congestion.    Yes [provider]  SENNA S 8.6-50 MG tablet Take 1 tablet by mouth 2 (two) times daily as needed for mild constipation.  09/16/18  Yes [provider]  temazepam (RESTORIL) 15 MG capsule Take 15 mg by mouth at bedtime as needed for sleep.  11/01/18  Yes [provider]  torsemide (DEMADEX) 20 MG tablet Take 60-80 mg by mouth 2 (two) times daily. Take 4 tablets (80 mg) in the morning and Take 3 tablets (60 mg) at noon   Yes [provider]  TOUJEO SOLOSTAR 300 UNIT/ML SOPN Inject 11 Units into the skin every evening.  12/08/18  Yes [provider]  valACYclovir (VALTREX) 1000 MG tablet Take 1,000 mg by mouth daily.   Yes [provider]  vitamin B-12 (CYANOCOBALAMIN) 500 MCG tablet Take 500 mcg by mouth every morning.    Yes [provider]  doxycycline (VIBRA-TABS) 100 MG tablet Take 1 tablet (100 mg total) by mouth 2 (two) times daily. Patient not taking: Reported on 12/21/2018 07/18/18   Newt Minion, MD  folic acid (FOLVITE) 1 MG tablet Take 1 tablet (1 mg total) by mouth daily. Patient not taking: Reported on 12/21/2018 04/17/18   Thurnell Lose, MD  hydrALAZINE (APRESOLINE) 25 MG tablet Take 1 tablet (25 mg total) by mouth every 8 (eight) hours. 04/06/18 05/06/18  Purohit, Konrad Dolores, MD  isosorbide mononitrate (IMDUR) 30 MG 24 hr tablet Take 1 tablet (30 mg total) by mouth daily. Patient taking differently: Take 30 mg by mouth every morning.  04/07/18 05/07/18  Purohit, Konrad Dolores, MD  LORazepam (ATIVAN) 0.5 MG tablet Take 0.5 mg by mouth at bedtime as needed for anxiety.    [provider]  methylcellulose (ARTIFICIAL TEARS) 1 % ophthalmic solution Place 1 drop into both eyes 2 (two) times daily as needed (dry eyes).    [provider]  metolazone (ZAROXOLYN) 5 MG tablet Take 5 mg by mouth daily as needed (edema).  09/19/18   [provider]  polyethylene glycol powder (GLYCOLAX/MIRALAX) 17 GM/SCOOP powder Take 0.5 Containers by mouth daily as needed for mild constipation.  08/19/18   [provider]  potassium chloride SA (K-DUR,KLOR-CON) 20 MEQ tablet Take by mouth as directed.  Patient not taking: Reported on 12/21/2018 03/18/18   Isaiah Serge, NP  tamsulosin [facility]) 0.4  MG CAPS capsule Take 0.4 mg by mouth every Monday, Wednesday, and Friday.     [provider]  torsemide (DEMADEX) 20 MG tablet Take 4 tablets (80 mg total) by mouth daily with breakfast. Patient taking differently: Take 60-80 mg by mouth See admin instructions. Take 80mg  in the morning, and 60mg  in the evening 04/07/18 05/07/18  Purohit, Konrad Dolores, MD    Family History Family History  Problem Relation Age of Onset  . Heart disease Mother   . Heart disease Father   . Anesthesia problems Neg Hx   . Hypotension Neg Hx   . Malignant hyperthermia Neg Hx   . Pseudochol deficiency Neg Hx     Social History Social History   Tobacco Use  . Smoking status: Former Smoker    Years: 40.00    Types: Cigarettes    Quit date: 10/27/2005    Years since quitting: 13.1  . Smokeless tobacco: Never Used  Substance Use Topics  . Alcohol use: Yes    Alcohol/week: 2.0 - 3.0 standard drinks    Types: 2 - 3 Cans of beer per week    Comment: occasional- weekly  . Drug use: No     Allergies   Amoxicillin-pot clavulanate, Morphine and related, Percocet [oxycodone-acetaminophen], and Restoril [temazepam]   Review of Systems Review of Systems  Constitutional: Positive for chills, fatigue and fever. Negative for diaphoresis.  HENT: Positive for sneezing. Negative for rhinorrhea and sore throat.   Eyes: Negative for visual disturbance.  Respiratory: Negative for shortness of breath.   Cardiovascular: Negative for chest pain.  Gastrointestinal: Negative for abdominal pain, constipation, diarrhea, nausea and vomiting.  Genitourinary: Negative for dysuria.  Musculoskeletal: Positive for extremity weakness.  Skin: Positive for rash.  Neurological: Positive for weakness. Negative for dizziness, syncope, light-headedness and headaches.    Physical Exam Updated Vital Signs BP (!) 150/60   Pulse 62   Temp 98.9 F (37.2 C) (Oral)   Resp (!) 25   Ht 5\' 10"  (1.778 m)   Wt 100.7 kg   SpO2 94%    BMI 31.85 kg/m   Physical Exam Vitals signs and nursing note reviewed.  Constitutional:      Appearance: He is well-developed.     Comments: On 2L nasal cannula  HENT:     Head: Normocephalic and atraumatic.  Neck:     Musculoskeletal: Neck supple.  Cardiovascular:     Rate and Rhythm: Normal rate and regular rhythm.     Heart sounds: No murmur. No friction rub. No gallop.   Pulmonary:     Effort: Pulmonary effort is normal. No respiratory distress.     Breath sounds: Normal breath sounds.  Abdominal:     Palpations: Abdomen is soft.     Tenderness: There is no abdominal tenderness.  Skin:    General: Skin is warm and dry.       Neurological:     Mental Status: He is alert and oriented to person, place, and time.    ED Treatments / Results  Labs (all labs ordered are listed, but only abnormal results are displayed) Labs Reviewed  COMPREHENSIVE METABOLIC PANEL - Abnormal; Notable for the following components:      Result Value   Sodium 134 (*)    Chloride 96 (*)    Glucose, Bld 196 (*)    BUN 60 (*)  Creatinine, Ser 2.21 (*)    Calcium 8.4 (*)    Total Protein 6.2 (*)    Albumin 2.7 (*)    AST 42 (*)    GFR calc non Af Amer 27 (*)    GFR calc Af Amer 32 (*)    All other components within normal limits  CBC WITH DIFFERENTIAL/PLATELET - Abnormal; Notable for the following components:   WBC 19.5 (*)    RBC 3.71 (*)    Hemoglobin 12.7 (*)    HCT 37.8 (*)    MCV 101.9 (*)    MCH 34.2 (*)    Neutro Abs 18.1 (*)    Lymphs Abs 0.5 (*)    Abs Immature Granulocytes 0.15 (*)    All other components within normal limits  URINALYSIS, ROUTINE W REFLEX MICROSCOPIC - Abnormal; Notable for the following components:   APPearance CLOUDY (*)    Hgb urine dipstick SMALL (*)    Protein, ur >=300 (*)    Leukocytes,Ua LARGE (*)    WBC, UA >50 (*)    Bacteria, UA MANY (*)    All other components within normal limits  CULTURE, BLOOD (ROUTINE X 2)  CULTURE, BLOOD (ROUTINE X  2)  URINE CULTURE  SARS CORONAVIRUS 2 (HOSPITAL ORDER, PERFORMED IN [place] [facility])  LACTIC ACID, PLASMA  LACTIC ACID, PLASMA    EKG EKG Interpretation  Date/Time:  Wednesday December 21 2018 17:37:28 EDT Ventricular Rate:  62 PR Interval:    QRS Duration: 189 QT Interval:  592 QTC Calculation: 602 R Axis:   120 Text Interpretation:  Sinus rhythm Prolonged PR interval IVCD, consider atypical RBBB Non-specific ST-t changes Confirmed by Lajean Saver [phone]) on 12/21/2018 5:50:40 PM   Radiology Dg Chest Port 1 View  Result Date: 12/21/2018 CLINICAL DATA:  Fever. Chills. Recent falls. EXAM: PORTABLE CHEST 1 VIEW COMPARISON:  Radiograph April 17, 2018 FINDINGS: Post median sternotomy.The cardiomediastinal contours are unchanged with mild cardiomegaly and aortic atherosclerosis. Vascular congestion without overt pulmonary edema. No consolidation, pleural effusion, or pneumothorax. No acute osseous abnormalities are seen. IMPRESSION: Cardiomegaly with vascular congestion. Electronically Signed   By: Keith Rake M.D.   On: 12/21/2018 18:08    Procedures Procedures (including critical care time)  Medications Ordered in ED Medications  vancomycin (VANCOCIN) IVPB 1000 mg/200 mL premix (1,000 mg Intravenous New Bag/Given 12/21/18 1730)  cefTRIAXone (ROCEPHIN) 2 g in sodium chloride 0.9 % 100 mL IVPB (2 g Intravenous New Bag/Given 12/21/18 1645)     Initial Impression / Assessment and Plan / ED Course  I have reviewed the triage vital signs and the nursing notes.  Pertinent labs & imaging results that were available during my care of the patient were reviewed by me and considered in my medical decision making (see chart for details).    [name] is a 78 y.o. male on hospice care due to severe CHF with significant PMH of CAD, PAD, HTN, hyperlipidemia, a fib, hypothyroidism, and diabetes mellitus who presents to the ED with progessive L lower extremity cellulitis. Pt was  febrile, but tachypnic with an elevated WBC of 19.5. Lactic acid was normal. Also, found to have a UTI with large leukocytes, >50 WBC, and many bacteria. Blood cultures and urine culture pending. Pt given 2g ceftriaxone and 1000mg  vancomycin while in the ED. Hospitalists called to admit.     Final Clinical Impressions(s) / ED Diagnoses   Final diagnoses:  Cellulitis of left leg  Acute UTI  Leukocytosis, unspecified  type  CRI (chronic renal insufficiency), stage 4 (severe) [facility])    ED Discharge Orders    None       Ladona Horns, MD 12/21/18 1850    Lajean Saver, MD 12/21/18 Einar Crow

## 2018-12-21 NOTE — Progress Notes (Addendum)
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 Essman is under hospice services with [facility] for heart failure.  He has remained a full code during his time with [place].    He was started on acyclovir yesterday for presumptive shingles.  His home care RN saw him today after a fall. Mr. Ogle asked her to review his will, which she did and states he desires to be a full code.  He confirms he would like to remain a full code and seek.  Family and pt wanted to remain at home, have blood cx drawn and IV abx, which hospice was unable to provide.  They subsequently decided to transfer to ED for further work up.  Should pt not require inpatient hospitalization and be able to return home, please use [facility] non-emergency transport as they contract this services for our active pts.  Venia Carbon RN, BSN, [facility] Liaison (in [place] under [facility] [place]) [phone] (main #) You may also secure chat in [place]

## 2018-12-21 NOTE — Progress Notes (Signed)
A consult was received from an ED physician for vancomycin per pharmacy dosing.    The patient's profile has been reviewed for ht/wt/allergies/indication/available labs.  There is no recent weight available in chart. Ht/wt order entered.  A one time order has been placed for vancomycin 1000 mg IV once.  Further antibiotics/pharmacy consults should be ordered by admitting physician if indicated.                       Thank you, Lenis Noon, PharmD 12/21/2018  4:47 PM

## 2018-12-21 NOTE — ED Triage Notes (Signed)
Pt arrived via EMS from home. Pt reports that he has been having intermittent fever, chill, and LE weakness x 6 days. Per EMS pt RT leg has redness note from hip to lower leg. Pt reports that he has a hx of cellulitis in the past that has turned in to sepsis. Pt has had  3 falls in  the past 24 hours. Pt was not evaluated and had a laceration to top of  head and was assessed by home hospice.    EMS 144/90 HR 94, RR18, 95% 2 LPM via [place]

## 2018-12-22 DIAGNOSIS — L03116 Cellulitis of left lower limb: Principal | ICD-10-CM

## 2018-12-22 DIAGNOSIS — N390 Urinary tract infection, site not specified: Secondary | ICD-10-CM

## 2018-12-22 DIAGNOSIS — I480 Paroxysmal atrial fibrillation: Secondary | ICD-10-CM

## 2018-12-22 DIAGNOSIS — I5043 Acute on chronic combined systolic (congestive) and diastolic (congestive) heart failure: Secondary | ICD-10-CM

## 2018-12-22 LAB — COMPREHENSIVE METABOLIC PANEL
ALT: 35 U/L (ref 0–44)
AST: 41 U/L (ref 15–41)
Albumin: 2.7 g/dL — ABNORMAL LOW (ref 3.5–5.0)
Alkaline Phosphatase: 55 U/L (ref 38–126)
Anion gap: 10 (ref 5–15)
BUN: 64 mg/dL — ABNORMAL HIGH (ref 8–23)
CO2: 26 mmol/L (ref 22–32)
Calcium: 8.6 mg/dL — ABNORMAL LOW (ref 8.9–10.3)
Chloride: 97 mmol/L — ABNORMAL LOW (ref 98–111)
Creatinine, Ser: 2.22 mg/dL — ABNORMAL HIGH (ref 0.61–1.24)
GFR calc Af Amer: 31 mL/min — ABNORMAL LOW (ref >60)
GFR calc non Af Amer: 27 mL/min — ABNORMAL LOW (ref >60)
Glucose, Bld: 143 mg/dL — ABNORMAL HIGH (ref 70–99)
Potassium: 4.2 mmol/L (ref 3.5–5.1)
Sodium: 133 mmol/L — ABNORMAL LOW (ref 135–145)
Total Bilirubin: 0.9 mg/dL (ref 0.3–1.2)
Total Protein: 6.4 g/dL — ABNORMAL LOW (ref 6.5–8.1)

## 2018-12-22 LAB — CBC
HCT: 39.4 % (ref 39.0–52.0)
Hemoglobin: 12.7 g/dL — ABNORMAL LOW (ref 13.0–17.0)
MCH: 33.4 pg (ref 26.0–34.0)
MCHC: 32.2 g/dL (ref 30.0–36.0)
MCV: 103.7 fL — ABNORMAL HIGH (ref 80.0–100.0)
Platelets: 148 10*3/uL — ABNORMAL LOW (ref 150–400)
RBC: 3.8 MIL/uL — ABNORMAL LOW (ref 4.22–5.81)
RDW: 14.8 % (ref 11.5–15.5)
WBC: 16.1 10*3/uL — ABNORMAL HIGH (ref 4.0–10.5)
nRBC: 0 % (ref 0.0–0.2)

## 2018-12-22 LAB — HEMOGLOBIN A1C
Hgb A1c MFr Bld: 7.5 % — ABNORMAL HIGH (ref 4.8–5.6)
Mean Plasma Glucose: 168.55 mg/dL

## 2018-12-22 LAB — GLUCOSE, CAPILLARY
Glucose-Capillary: 137 mg/dL — ABNORMAL HIGH (ref 70–99)
Glucose-Capillary: 157 mg/dL — ABNORMAL HIGH (ref 70–99)
Glucose-Capillary: 189 mg/dL — ABNORMAL HIGH (ref 70–99)
Glucose-Capillary: 164 mg/dL — ABNORMAL HIGH (ref 70–99)
Glucose-Capillary: 144 mg/dL — ABNORMAL HIGH (ref 70–99)

## 2018-12-22 MED ORDER — INSULIN ASPART 100 UNIT/ML ~~LOC~~ SOLN
0.0000 [IU] | Freq: Three times a day (TID) | SUBCUTANEOUS | Status: DC
  Administered 2018-12-22 – 2018-12-23 (×4): 2 [IU] via SUBCUTANEOUS
  Administered 2018-12-23: 09:00:00 1 [IU] via SUBCUTANEOUS
  Administered 2018-12-23: 12:00:00 2 [IU] via SUBCUTANEOUS
  Administered 2018-12-24: 3 [IU] via SUBCUTANEOUS
  Administered 2018-12-24: 2 [IU] via SUBCUTANEOUS
  Administered 2018-12-24 – 2018-12-25 (×2): 1 [IU] via SUBCUTANEOUS
  Administered 2018-12-25 (×2): 2 [IU] via SUBCUTANEOUS
  Administered 2018-12-26: 1 [IU] via SUBCUTANEOUS
  Filled 2018-12-22: qty 0.09

## 2018-12-22 MED ORDER — HYDROCODONE-ACETAMINOPHEN 5-325 MG PO TABS
1.0000 | ORAL_TABLET | Freq: Four times a day (QID) | ORAL | Status: DC | PRN
  Administered 2018-12-23 – 2018-12-25 (×7): 1 via ORAL
  Filled 2018-12-22 (×7): qty 1

## 2018-12-22 MED ORDER — TORSEMIDE 20 MG PO TABS
80.0000 mg | ORAL_TABLET | Freq: Every day | ORAL | Status: DC
  Administered 2018-12-22 – 2018-12-26 (×5): 80 mg via ORAL
  Filled 2018-12-22 (×6): qty 4

## 2018-12-22 MED ORDER — ISOSORBIDE MONONITRATE ER 30 MG PO TB24: 30.0000 mg | ORAL_TABLET | Freq: Every day | ORAL | Status: DC

## 2018-12-22 MED ORDER — TORSEMIDE 20 MG PO TABS
60.0000 mg | ORAL_TABLET | Freq: Every day | ORAL | Status: DC
  Administered 2018-12-22 – 2018-12-25 (×4): 60 mg via ORAL
  Filled 2018-12-22 (×5): qty 3

## 2018-12-22 MED ORDER — ISOSORBIDE MONONITRATE ER 30 MG PO TB24
30.0000 mg | ORAL_TABLET | Freq: Every day | ORAL | Status: DC
  Administered 2018-12-22 – 2018-12-26 (×5): 30 mg via ORAL
  Filled 2018-12-22 (×5): qty 1

## 2018-12-22 MED ORDER — MIRABEGRON ER 25 MG PO TB24
25.0000 mg | ORAL_TABLET | Freq: Every day | ORAL | Status: DC
  Administered 2018-12-22 – 2018-12-26 (×5): 25 mg via ORAL
  Filled 2018-12-22 (×5): qty 1

## 2018-12-22 MED ORDER — TEMAZEPAM 15 MG PO CAPS
15.0000 mg | ORAL_CAPSULE | Freq: Every evening | ORAL | Status: DC | PRN
  Administered 2018-12-23: 22:00:00 15 mg via ORAL
  Filled 2018-12-22: qty 1

## 2018-12-22 MED ORDER — AMIODARONE HCL 200 MG PO TABS
200.0000 mg | ORAL_TABLET | Freq: Every evening | ORAL | Status: DC
  Administered 2018-12-22 – 2018-12-25 (×4): 200 mg via ORAL
  Filled 2018-12-22 (×5): qty 1

## 2018-12-22 MED ORDER — HYDRALAZINE HCL 25 MG PO TABS
25.0000 mg | ORAL_TABLET | Freq: Three times a day (TID) | ORAL | Status: DC
  Administered 2018-12-22 – 2018-12-26 (×15): 25 mg via ORAL
  Filled 2018-12-22 (×16): qty 1

## 2018-12-22 MED ORDER — FERROUS SULFATE 325 (65 FE) MG PO TABS
325.0000 mg | ORAL_TABLET | Freq: Every day | ORAL | Status: DC
  Administered 2018-12-22 – 2018-12-26 (×5): 325 mg via ORAL
  Filled 2018-12-22 (×6): qty 1

## 2018-12-22 MED ORDER — CARVEDILOL 3.125 MG PO TABS
3.1250 mg | ORAL_TABLET | Freq: Two times a day (BID) | ORAL | Status: DC
  Administered 2018-12-22 – 2018-12-25 (×8): 3.125 mg via ORAL
  Filled 2018-12-22 (×10): qty 1

## 2018-12-22 MED ORDER — ATORVASTATIN CALCIUM 20 MG PO TABS
20.0000 mg | ORAL_TABLET | Freq: Every day | ORAL | Status: DC
  Administered 2018-12-22 – 2018-12-25 (×4): 20 mg via ORAL
  Filled 2018-12-22 (×4): qty 1

## 2018-12-22 MED ORDER — INSULIN GLARGINE 100 UNIT/ML ~~LOC~~ SOLN
11.0000 [IU] | Freq: Every evening | SUBCUTANEOUS | Status: DC
  Administered 2018-12-22 – 2018-12-25 (×4): 11 [IU] via SUBCUTANEOUS
  Filled 2018-12-22 (×6): qty 0.11

## 2018-12-22 MED ORDER — LORAZEPAM 0.5 MG PO TABS
0.5000 mg | ORAL_TABLET | Freq: Every evening | ORAL | Status: DC | PRN
  Administered 2018-12-24: 0.5 mg via ORAL
  Filled 2018-12-22: qty 1

## 2018-12-22 MED ORDER — METOLAZONE 5 MG PO TABS
5.0000 mg | ORAL_TABLET | Freq: Every day | ORAL | Status: DC | PRN
  Filled 2018-12-22: qty 1

## 2018-12-22 MED ORDER — VITAMIN B-12 1000 MCG PO TABS
500.0000 ug | ORAL_TABLET | Freq: Every day | ORAL | Status: DC
  Administered 2018-12-22 – 2018-12-26 (×5): 500 ug via ORAL
  Filled 2018-12-22 (×5): qty 1

## 2018-12-22 MED ORDER — TAMSULOSIN HCL 0.4 MG PO CAPS
0.4000 mg | ORAL_CAPSULE | ORAL | Status: DC
  Administered 2018-12-23 – 2018-12-26 (×2): 0.4 mg via ORAL
  Filled 2018-12-22 (×2): qty 1

## 2018-12-22 MED ORDER — HYDRALAZINE HCL 25 MG PO TABS: 25.0000 mg | ORAL_TABLET | Freq: Three times a day (TID) | ORAL | Status: DC

## 2018-12-22 MED ORDER — LEVOTHYROXINE SODIUM 112 MCG PO TABS
112.0000 ug | ORAL_TABLET | Freq: Every day | ORAL | Status: DC
  Administered 2018-12-22 – 2018-12-26 (×5): 112 ug via ORAL
  Filled 2018-12-22 (×6): qty 1

## 2018-12-22 MED ORDER — NITROGLYCERIN 0.4 MG SL SUBL: 0.4000 mg | SUBLINGUAL_TABLET | SUBLINGUAL | Status: DC | PRN

## 2018-12-22 NOTE — ED Notes (Addendum)
ED TO INPATIENT HANDOFF REPORT  ED Nurse Name and Phone #: Fredonia Highland [phone]  S Name/Age/Gender [name] 78 y.o. male Room/Bed: WA06/WA06  Code Status   Code Status: DNR  Home/SNF/Other Home Patient oriented to: self, place, time and situation Is this baseline? Yes   Triage Complete: Triage complete  Chief Complaint Fall; Weakness; Leg Pain  Triage Note Pt arrived via EMS from home. Pt reports that he has been having intermittent fever, chill, and LE weakness x 6 days. Per EMS pt RT leg has redness note from hip to lower leg. Pt reports that he has a hx of cellulitis in the past that has turned in to sepsis. Pt has had  3 falls in  the past 24 hours. Pt was not evaluated and had a laceration to top of  head and was assessed by home hospice.    EMS 144/90 HR 94, RR18, 95% 2 LPM via [place]   Allergies Allergies  Allergen Reactions  . Amoxicillin-Pot Clavulanate Nausea Only    Has patient had a PCN reaction causing immediate rash, facial/tongue/throat swelling, SOB or lightheadedness with hypotension: No Has patient had a PCN reaction causing severe rash involving mucus membranes or skin necrosis: No Has patient had a PCN reaction that required hospitalization: No Has patient had a PCN reaction occurring within the last 10 years: No If all of the above answers are "NO", then may proceed with Cephalosporin use.   Marland Kitchen Morphine And Related Other (See Comments)    hallucinations  . Percocet [Oxycodone-Acetaminophen] Other (See Comments)    Agitation and Increased Confusion. Family asks to please not order this medication for him.   . Restoril [Temazepam] Other (See Comments)    Confusion and Agitation. Has taken in past, but has not reacted well. Family requests to not give this mediation.     Level of Care/Admitting Diagnosis ED Disposition    ED Disposition Condition [facility] Area: [place] [[id number]]  Level of Care: Med-Surg  [16]  Covid Evaluation: Asymptomatic Screening Protocol (No Symptoms)  Diagnosis: Left leg cellulitis [[id number]]  Admitting Physician: Elwyn Reach [[id number]]  Attending Physician: Elwyn Reach [[id number]]  Estimated length of stay: past midnight tomorrow  Certification:: I certify this patient will need inpatient services for at least 2 midnights  PT Class (Do Not Modify): Inpatient [101]  PT Acc Code (Do Not Modify): Private [1]       B Medical/Surgery History Past Medical History:  Diagnosis Date  . Arthritis   . Atherosclerotic heart disease   . Back pain   . Bruises easily    takes Pletal daily and ASA 325mg  daily  . Cataract immature    right  . Chest pain   . Colon polyps   . Complication of anesthesia    hard to wake up with bypass surgery  . Cyst    on perineum;takes Doxycycline daily  . Diabetes mellitus    takes Glipizide,Metformin,and Actos daily  . Diarrhea   . Dyspnea    when lying flat  . Edema    right leg knee down swollen since fall 3 wks ago  . Foot drop, left   . GERD (gastroesophageal reflux disease)    Rolaids as needed-occ reflux  . Hemorrhoids   . History of kidney stones    at age 14   . Hyperlipidemia    takes Tricor and Lipitor daily  . Hypertension    takes Altace,Amlodipine,and  Nadolol daily  . Hypothyroidism   . Kidney stone    35+yrs ago  . Muscle pain   . Nasal polyps    hx of  . Peripheral edema    wears knee length hose-told by podiatrist to wear these  . Peripheral neuropathy    takes Gabapentin daily  . Pneumonia    as a child  . PVD (peripheral vascular disease) ([place])   . Renal insufficiency   . Seasonal allergies    takes Mucinex and Zyrtec prn  . Urinary frequency    urgency/takes Vesicare daily   Past Surgical History:  Procedure Laterality Date  . Aortobifemoral bypass    . APPLICATION OF WOUND VAC Left 10/25/2017   Procedure: Wound vac placement to left groin.;  Surgeon: Elam Dutch, MD;  Location: [facility]  OR;  Service: Vascular;  Laterality: Left;  . APPLICATION OF WOUND VAC Left 11/10/2017   Procedure: APPLICATION OF WOUND VAC;  Surgeon: Elam Dutch, MD;  Location: [place];  Service: Vascular;  Laterality: Left;  . CARDIAC CATHETERIZATION  most recent 05/2011   total of 4  . CARDIOVERSION N/A 03/22/2018   Procedure: CARDIOVERSION;  Surgeon: Jerline Pain, MD;  Location: [facility] ENDOSCOPY;  Service: Cardiovascular;  Laterality: N/A;  . CAROTID ANGIOGRAM N/A 06/05/2011   Procedure: CAROTID Cyril Loosen;  Surgeon: Elam Dutch, MD;  Location: [facility] CATH LAB;  Service: Cardiovascular;  Laterality: N/A;  . CAROTID ENDARTERECTOMY  04/08/2011   left CEA  . cataract surgery Bilateral    left  . COLONOSCOPY    . COLONOSCOPY WITH PROPOFOL N/A 08/22/2013   Procedure: COLONOSCOPY WITH PROPOFOL;  Surgeon: Garlan Fair, MD;  Location: [facility] ENDOSCOPY;  Service: Endoscopy;  Laterality: N/A;  . CORONARY ARTERY BYPASS GRAFT  09/09/2011   Procedure: CORONARY ARTERY BYPASS GRAFTING (CABG);  Surgeon: Gaye Pollack, MD;  Location: [place];  Service: Open Heart Surgery;  Laterality: N/A;  Coronary artery bypass grafting  x three with Right saphenous vein harvested endoscopically and left internal mammary artery  . ENDARTERECTOMY  04/08/2011   Procedure: ENDARTERECTOMY CAROTID;  Surgeon: Elam Dutch, MD;  Location[facility] OR;  Service: Vascular;  Laterality: Left;  with patch angioplasty  . ENDARTERECTOMY  09/09/2011   Procedure: ENDARTERECTOMY CAROTID;  Surgeon: Elam Dutch, MD;  Location: [facility] OR;  Service: Vascular;  Laterality: Right;  with patch angioplasty  . ESOPHAGOGASTRODUODENOSCOPY (EGD) WITH PROPOFOL N/A 03/10/2018   Procedure: ESOPHAGOGASTRODUODENOSCOPY (EGD) WITH PROPOFOL;  Surgeon: Arta Silence, MD;  Location: [place];  Service: Endoscopy;  Laterality: N/A;  . FEMORAL-POPLITEAL BYPASS GRAFT Left 10/25/2017   Procedure: Left Common Femoral Endarterectomy and perfundaplasty, Left BYPASS GRAFT  FEMORAL-BELOW KNEE POPLITEAL ARTERY.;  Surgeon: Elam Dutch, MD;  Location: [place];  Service: Vascular;  Laterality: Left;  . GROIN DEBRIDEMENT Left 11/10/2017   Procedure: GROIN DEBRIDEMENT;  Surgeon: Elam Dutch, MD;  Location: [place];  Service: Vascular;  Laterality: Left;  . LEFT HEART CATHETERIZATION WITH CORONARY ANGIOGRAM N/A 07/09/2011   Procedure: LEFT HEART CATHETERIZATION WITH CORONARY ANGIOGRAM;  Surgeon: Sinclair Grooms, MD;  Location: [facility] CATH LAB;  Service: Cardiovascular;  Laterality: N/A;  . LOWER EXTREMITY ANGIOGRAPHY N/A 10/11/2017   Procedure: LOWER EXTREMITY ANGIOGRAPHY;  Surgeon: Waynetta Sandy, MD;  Location: [place] CV LAB;  Service: Cardiovascular;  Laterality: N/A;  . Reimplantation of inferior mesenteric artery    . Repair of infrarenal abdominal aortic aneurysm    . RIGHT HEART CATH  N/A 04/04/2018   Procedure: RIGHT HEART CATH;  Surgeon: Larey Dresser, MD;  Location: [place] CV LAB;  Service: Cardiovascular;  Laterality: N/A;  . SHOULDER ARTHROSCOPY W/ ROTATOR CUFF REPAIR     right and left-open procedures  . TEE WITHOUT CARDIOVERSION N/A 03/22/2018   Procedure: TRANSESOPHAGEAL ECHOCARDIOGRAM (TEE);  Surgeon: Jerline Pain, MD;  Location: [facility] ENDOSCOPY;  Service: Cardiovascular;  Laterality: N/A;  . TONSILLECTOMY     at age 71  . TRIGGER FINGER RELEASE Right 06/28/2014   Procedure: RIGHT LONG TRIGGER RELEASE ;  Surgeon: Leanora Cover, MD;  Location: [place];  Service: Orthopedics;  Laterality: Right;  . wisdom teeth extracted     as a teenager  . WOUND DEBRIDEMENT Left 11/05/2017   Procedure: DEBRIDEMENT WOUND LEFT GROIN with wound vac placement;  Surgeon: Elam Dutch, MD;  Location: [facility] OR;  Service: Vascular;  Laterality: Left;     A IV Location/Drains/Wounds Patient Lines/Drains/Airways Status   Active Line/Drains/Airways    Name:   Placement date:   Placement time:   Site:   Days:   Peripheral IV 12/21/18  Right Antecubital   12/21/18    1645    Antecubital   1   Incision (Closed) 11/10/17 Groin Left   11/10/17    0936     407   Wound / Incision (Open or Dehisced) 03/19/18 Other (Comment) Leg Left   03/19/18    2059    Leg   278          Intake/Output Last 24 hours  Intake/Output Summary (Last 24 hours) at 12/22/2018 0226 Last data filed at 12/22/2018 0135 Gross per 24 hour  Intake 403 ml  Output -  Net 403 ml    Labs/Imaging Results for orders placed or performed during the hospital encounter of 12/21/18 (from the past 48 hour(s))  Lactic acid, plasma     Status: None   Collection Time: 12/21/18  4:54 PM  Result Value Ref Range   Lactic Acid, Venous 1.6 0.5 - 1.9 mmol/L    Comment: Performed at [facility], [place] [address], [place], [place] [address]  Comprehensive metabolic panel     Status: Abnormal   Collection Time: 12/21/18  4:54 PM  Result Value Ref Range   Sodium 134 (L) 135 - 145 mmol/L   Potassium 4.1 3.5 - 5.1 mmol/L   Chloride 96 (L) 98 - 111 mmol/L   CO2 27 22 - 32 mmol/L   Glucose, Bld 196 (H) 70 - 99 mg/dL   BUN 60 (H) 8 - 23 mg/dL   Creatinine, Ser 2.21 (H) 0.61 - 1.24 mg/dL   Calcium 8.4 (L) 8.9 - 10.3 mg/dL   Total Protein 6.2 (L) 6.5 - 8.1 g/dL   Albumin 2.7 (L) 3.5 - 5.0 g/dL   AST 42 (H) 15 - 41 U/L   ALT 36 0 - 44 U/L   Alkaline Phosphatase 58 38 - 126 U/L   Total Bilirubin 1.0 0.3 - 1.2 mg/dL   GFR calc non Af Amer 27 (L) >60 mL/min   GFR calc Af Amer 32 (L) >60 mL/min   Anion gap 11 5 - 15    Comment: Performed at [facility], [place] [address] [place], [place] [address]  CBC WITH DIFFERENTIAL     Status: Abnormal   Collection Time: 12/21/18  4:54 PM  Result Value Ref Range   WBC 19.5 (H) 4.0 - 10.5 K/uL  RBC 3.71 (L) 4.22 - 5.81 MIL/uL   Hemoglobin 12.7 (L) 13.0 - 17.0 g/dL   HCT 37.8 (L) 39.0 - 52.0 %   MCV 101.9 (H) 80.0 - 100.0 fL   MCH 34.2 (H) 26.0 - 34.0 pg   MCHC 33.6 30.0 - 36.0 g/dL   RDW 14.8  11.5 - 15.5 %   Platelets 150 150 - 400 K/uL   nRBC 0.0 0.0 - 0.2 %   Neutrophils Relative % 92 %   Neutro Abs 18.1 (H) 1.7 - 7.7 K/uL   Lymphocytes Relative 3 %   Lymphs Abs 0.5 (L) 0.7 - 4.0 K/uL   Monocytes Relative 4 %   Monocytes Absolute 0.7 0.1 - 1.0 K/uL   Eosinophils Relative 0 %   Eosinophils Absolute 0.0 0.0 - 0.5 K/uL   Basophils Relative 0 %   Basophils Absolute 0.0 0.0 - 0.1 K/uL   WBC Morphology INCREASED BANDS (>20% BANDS)    Immature Granulocytes 1 %   Abs Immature Granulocytes 0.15 (H) 0.00 - 0.07 K/uL    Comment: Performed at [facility], [place] [address] [place], [place] [address]  Urinalysis, Routine w reflex microscopic     Status: Abnormal   Collection Time: 12/21/18  4:54 PM  Result Value Ref Range   Color, Urine YELLOW YELLOW   APPearance CLOUDY (A) CLEAR   Specific Gravity, Urine 1.011 1.005 - 1.030   pH 5.0 5.0 - 8.0   Glucose, UA NEGATIVE NEGATIVE mg/dL   Hgb urine dipstick SMALL (A) NEGATIVE   Bilirubin Urine NEGATIVE NEGATIVE   Ketones, ur NEGATIVE NEGATIVE mg/dL   Protein, ur >=300 (A) NEGATIVE mg/dL   Nitrite NEGATIVE NEGATIVE   Leukocytes,Ua LARGE (A) NEGATIVE   RBC / HPF 6-10 0 - 5 RBC/hpf   WBC, UA >50 (H) 0 - 5 WBC/hpf   Bacteria, UA MANY (A) NONE SEEN   WBC Clumps PRESENT    Mucus PRESENT    Hyaline Casts, UA PRESENT     Comment: Performed at [facility], [place] [address] [place], [place] [address]  SARS Coronavirus 2 (CEPHEID - Performed in [place] hospital lab), Hosp Order     Status: None   Collection Time: 12/21/18  4:54 PM   Specimen: Urine, Clean Catch; Nasopharyngeal  Result Value Ref Range   SARS Coronavirus 2 NEGATIVE NEGATIVE    Comment: (NOTE) If result is NEGATIVE SARS-CoV-2 target nucleic acids are NOT DETECTED. The SARS-CoV-2 RNA is generally detectable in upper and lower  respiratory specimens during the acute phase of infection. The lowest  concentration of SARS-CoV-2 viral  copies this assay can detect is 250  copies / mL. A negative result does not preclude SARS-CoV-2 infection  and should not be used as the sole basis for treatment or other  patient management decisions.  A negative result may occur with  improper specimen collection / handling, submission of specimen other  than nasopharyngeal swab, presence of viral mutation(s) within the  areas targeted by this assay, and inadequate number of viral copies  (<250 copies / mL). A negative result must be combined with clinical  observations, patient history, and epidemiological information. If result is POSITIVE SARS-CoV-2 target nucleic acids are DETECTED. The SARS-CoV-2 RNA is generally detectable in upper and lower  respiratory specimens dur ing the acute phase of infection.  Positive  results are indicative of active infection with SARS-CoV-2.  Clinical  correlation with patient history and other diagnostic information is  necessary to determine patient infection status.  Positive results do  not rule out bacterial infection or co-infection with other viruses. If result is PRESUMPTIVE POSTIVE SARS-CoV-2 nucleic acids MAY BE PRESENT.   A presumptive positive result was obtained on the submitted specimen  and confirmed on repeat testing.  While 2019 novel coronavirus  (SARS-CoV-2) nucleic acids may be present in the submitted sample  additional confirmatory testing may be necessary for epidemiological  and / or clinical management purposes  to differentiate between  SARS-CoV-2 and other Sarbecovirus currently known to infect humans.  If clinically indicated additional testing with an alternate test  methodology [phone]) is advised. The SARS-CoV-2 RNA is generally  detectable in upper and lower respiratory sp ecimens during the acute  phase of infection. The expected result is Negative. Fact Sheet for Patients:  StrictlyIdeas.no Fact Sheet for Healthcare  Providers: BankingDealers.co.za This test is not yet approved or cleared by the [place] FDA and has been authorized for detection and/or diagnosis of SARS-CoV-2 by FDA under an Emergency Use Authorization (EUA).  This EUA will remain in effect (meaning this test can be used) for the duration of the COVID-19 declaration under Section 564(b)(1) of the Act, 21 U.S.C. section 360bbb-3(b)(1), unless the authorization is terminated or revoked sooner. Performed at [facility], [place] [address], [place], [place] [address]   CBC     Status: Abnormal   Collection Time: 12/21/18 10:39 PM  Result Value Ref Range   WBC 17.4 (H) 4.0 - 10.5 K/uL   RBC 3.62 (L) 4.22 - 5.81 MIL/uL   Hemoglobin 12.1 (L) 13.0 - 17.0 g/dL   HCT 36.7 (L) 39.0 - 52.0 %   MCV 101.4 (H) 80.0 - 100.0 fL   MCH 33.4 26.0 - 34.0 pg   MCHC 33.0 30.0 - 36.0 g/dL   RDW 14.6 11.5 - 15.5 %   Platelets 138 (L) 150 - 400 K/uL   nRBC 0.0 0.0 - 0.2 %    Comment: Performed at [facility], [place] [address] [place], [place] [address]  Creatinine, serum     Status: Abnormal   Collection Time: 12/21/18 10:39 PM  Result Value Ref Range   Creatinine, Ser 2.23 (H) 0.61 - 1.24 mg/dL   GFR calc non Af Amer 27 (L) >60 mL/min   GFR calc Af Amer 31 (L) >60 mL/min    Comment: Performed at [facility], [place] [address] [place], [place] [address]   Dg Chest Port 1 View  Result Date: 12/21/2018 CLINICAL DATA:  Fever. Chills. Recent falls. EXAM: PORTABLE CHEST 1 VIEW COMPARISON:  Radiograph April 17, 2018 FINDINGS: Post median sternotomy.The cardiomediastinal contours are unchanged with mild cardiomegaly and aortic atherosclerosis. Vascular congestion without overt pulmonary edema. No consolidation, pleural effusion, or pneumothorax. No acute osseous abnormalities are seen. IMPRESSION: Cardiomegaly with vascular congestion. Electronically Signed   By: Keith Rake M.D.   On: 12/21/2018 18:08    Pending Labs Unresulted Labs (From admission, onward)    Start     Ordered   12/28/18 0500  Creatinine, serum  (enoxaparin (LOVENOX)    CrCl >/= 30 ml/min)  Weekly,   R    Comments: while on enoxaparin therapy    12/21/18 2202   12/22/18 0500  Comprehensive metabolic panel  Tomorrow morning,   R     12/21/18 2202   12/22/18 0500  CBC  Tomorrow morning,   R     12/21/18 2202  12/22/18 0500  Creatinine, serum  Daily,   R     12/21/18 1947   12/22/18 0004  Hemoglobin A1c  Once,   STAT    Comments: To assess prior glycemic control    12/22/18 0003   12/21/18 1621  Blood Culture (routine x 2)  BLOOD CULTURE X 2,   STAT     12/21/18 1621   12/21/18 1621  Urine culture  ONCE - STAT,   STAT     12/21/18 1621          Vitals/Pain Today's Vitals   12/21/18 2300 12/22/18 0000 12/22/18 0030 12/22/18 0100  BP: (!) 169/77 (!) 150/67 (!) 150/74 (!) 150/67  Pulse: 67 69 69 67  Resp: (!) 25 (!) 25 19 (!) 29  Temp:      TempSrc:      SpO2: 99% 95% 96% 96%  Weight:      Height:      PainSc:        Isolation Precautions No active isolations  Medications Medications  enoxaparin (LOVENOX) injection 40 mg (40 mg Subcutaneous Given 12/21/18 2258)  sodium chloride flush (NS) 0.9 % injection 3 mL (3 mLs Intravenous Given 12/22/18 0135)  sodium chloride flush (NS) 0.9 % injection 3 mL (3 mLs Intravenous Given 12/22/18 0135)  0.9 %  sodium chloride infusion (has no administration in time range)  ondansetron (ZOFRAN) tablet 4 mg (has no administration in time range)    Or  ondansetron (ZOFRAN) injection 4 mg (has no administration in time range)  vancomycin (VANCOCIN) IVPB 1000 mg/200 mL premix (has no administration in time range)  meropenem (MERREM) 1 g in sodium chloride 0.9 % 100 mL IVPB (has no administration in time range)  insulin aspart (novoLOG) injection 0-9 Units (has no administration in time range)  hydrALAZINE (APRESOLINE) tablet 25 mg  (25 mg Oral Given 12/22/18 0133)  isosorbide mononitrate (IMDUR) 24 hr tablet 30 mg (has no administration in time range)  torsemide (DEMADEX) tablet 80 mg (has no administration in time range)  amiodarone (PACERONE) tablet 200 mg (has no administration in time range)  atorvastatin (LIPITOR) tablet 20 mg (has no administration in time range)  carvedilol (COREG) tablet 3.125 mg (3.125 mg Oral Given 12/22/18 0133)  ferrous sulfate tablet 325 mg (has no administration in time range)  HYDROcodone-acetaminophen (NORCO/VICODIN) 5-325 MG per tablet 1 tablet (has no administration in time range)  levothyroxine (SYNTHROID) tablet 112 mcg (has no administration in time range)  LORazepam (ATIVAN) tablet 0.5 mg (has no administration in time range)  metolazone (ZAROXOLYN) tablet 5 mg (has no administration in time range)  mirabegron ER (MYRBETRIQ) tablet 25 mg (has no administration in time range)  nitroGLYCERIN (NITROSTAT) SL tablet 0.4 mg (has no administration in time range)  tamsulosin (FLOMAX) capsule 0.4 mg (has no administration in time range)  temazepam (RESTORIL) capsule 15 mg (has no administration in time range)  insulin glargine (LANTUS) injection 11 Units (has no administration in time range)  vitamin B-12 (CYANOCOBALAMIN) tablet 500 mcg (has no administration in time range)  torsemide (DEMADEX) tablet 60 mg (has no administration in time range)  vancomycin (VANCOCIN) IVPB 1000 mg/200 mL premix (0 mg Intravenous Stopped 12/21/18 1917)  cefTRIAXone (ROCEPHIN) 2 g in sodium chloride 0.9 % 100 mL IVPB (0 g Intravenous Stopped 12/21/18 1916)  meropenem (MERREM) 1 g in sodium chloride 0.9 % 100 mL IVPB (0 g Intravenous Stopped 12/21/18 2217)    Mobility walks with person assist Low fall  risk   Focused Assessments general    R Recommendations: See Admitting Provider Note  Report given to: Pam RM  Additional Notes: Patient can not walk and need help transferring from chair to  wheelchair.

## 2018-12-22 NOTE — Progress Notes (Addendum)
[facility] [facility] ([facility]) GIP RN Note  This is a related and covered GIP admission of 12/22/18 with a [facility] diagnosis of Heart Failure with Chronic Kidney Disease per Dr. Orpah Melter of [place].  Patient has had intermittent fever and generalized weakness for the last couple of days in the home then experienced a fall - lacerating his scalp. During an [place] homecare RN visit for assessment of situation, Patient encouraged to transport to ER for evaluation after patient/family requested full treatment and lab workup which Hospice was unable to provide in the home. Patient was admitted to floor on 12/22/18 with hospital diagnosis of Left leg Cellulitis/Possible UTI .   Patient is listed as a FULL CODE with Manufacturing engineer which patient has confirmed prior to this admission and via telephone with this writer this morning.   Spoke with [facility] bedside RN who was made aware of patient code status wishes prior to this admission. RN stated patient was in NAD, with no complaints of pain, good appetite and currently sitting at bedside.  Checked in with patient via telephone, who complains of generalized weakness/fatigue but no noted discomfort at this time. Pt also stated that Left leg is still tender to touch/reddened and that scalp dressing intact.   Listened and supported patient then notified him that [facility] will continue to follow him daily during this hospitalization.  VS: 98.2, 143/64, 65,18 with 99% sats on 2L O2 I/O: Intake 403cc and Output 300cc = net +103  Abnormal Labs: (12/22/18) NA 133, Chl 97, Glucose 143, BUN 64, Creat 2.22, Cal 8.6, Albumin 2.7, Tot Prot 6.4, Tot Bil 0.9, GFR 27, WBC 16.1, RBC 3.90, Hemo 12.7, MCV 103.7, Plat 148, Hemo A1C 7.5 IV Medications: Vancomycin IVPB 1000mg /281ml premix q36hr, Merrem 1g in NS 182ml IVPB q12hr,  No PRNs administered this shift  Problem List per Epic MD Notes: Left leg Cellulitis/UTI: IV Antibiotics, Blood and Urine Cx ordered - monitor  results Generalized Weakness: PT Consult during admission.   Goals of Care: [facility] has patient listed as a Full Code in the home, patient confirmed and states that hospital should have a copy of his Advanced Directives. Clarification needs to be addressed by hospital as they have him listed as a DNR at this time. Bedside RN made aware.  Discharge Planning: Upon admission, plan was to discharge when medically ready - possibly this weekend. Once patient is ready to discharge, please contact [facility] for non emergency transport as they contract with [facility] for this service with active hospice patients.   Communication with PCG: Reached out to wife [name] to support and update on patient condition. Pt wife encouraged to call with concerns/questions and given [facility] contact information.   Communication with IDT: Updated [place] team. AOR notified of admission   Thank you,  Gar Ponto, RN [place] (on [place]) [phone]  Update: [facility] Medication List and Transfer Summary faxed to [facility] unit station to be placed on shadow chart.

## 2018-12-22 NOTE — Progress Notes (Signed)
[facility]                                                                              Patient Demographics  [name], is a 78 y.o. male, DOB - January 21, 1941, IRC:[id number]  Admit date - 12/21/2018   Admitting Physician Elwyn Reach, MD  Outpatient Primary MD for the patient is Lavone Orn, MD  Outpatient specialists:   LOS - 1  days   Medical records reviewed and are as summarized below:    Chief Complaint  Patient presents with  . Extremity Weakness  . Fall       Brief summary   HPI per Dr Jonelle Sidle  Patient is a 78 year old male with ischemic cardiomyopathy with severe systolic dysfunction, diabetes, hypertension, hyperlipidemia, history of coronary artery bypass grafting, history of DVT, chronic kidney disease stage III, history of carotid stenosis, polyneuropathy, paroxysmal atrial fibrillation, history of GI bleed with symptomatic anemia who is currently on hospice care for palliative therapy brought in secondary to left lower extremity swelling redness and pain.  Symptoms have been going on for about 6 days.  He has had falls and weakness.  It started as a red spot like a dollar bill size and progressed so far.  It is now covering his left lower extremity all the way to his thigh.  No itching.  Is more warmth.  The right side is not affected.  Today he was transferring from chair to wheelchair when he could not stand and he fell.  Patient came in evaluated and found to have cellulitis.  Urinalysis also indicated possible UTI he is therefore being admitted for further work-up.  He denied any fever or chills no nausea vomiting or diarrhea.Marland Kitchen  COVID negative   Assessment & Plan    Principal Problem:   Left leg cellulitis -Still has significant left leg cellulitis, warm and erythematous, see picture below -Continue IV vancomycin and meropenem -Continue to elevate the leg and monitor closely, leukocytosis slightly improving  Active Problems:  Diabetes mellitus type 2 with complications (HCC) -Hemoglobin A1c 7.5 -CBGs fairly stable -Continue sliding scale insulin, Lantus 11 units daily  History of CAD S/P CABG (coronary artery bypass graft), ischemic cardiomyopathy -Currently no complaints of chest pain or shortness of breath, follow I's and O's -Continue atorvastatin, Coreg, Imdur, Demadex  Paroxysmal atrial fibrillation -Currently normal sinus rhythm, continue amiodarone  UTI -Follow urine culture and sensitivities, continue IV meropenem  Essential hypertension -BP currently stable, continue Coreg, Imdur  CKD stage II - Cr stable, close to baseline  Hypothyroidism -Continue Synthroid  Generalized debility -PT OT evaluation   Code Status: Full CODE STATUS DVT Prophylaxis: Lovenox Family Communication: Discussed in detail with the patient, all imaging results, lab results explained to the patient and daughter on phone    Disposition Plan: Cont 24-48hrs of IV antibiotics   Time Spent in minutes  86mins   Procedures:  *  Consultants:     Antimicrobials:   Anti-infectives (From admission, onward)   Start     Dose/Rate Route Frequency Ordered Stop   12/22/18 1800  vancomycin (VANCOCIN) IVPB 1000 mg/200 mL premix  1,000 mg 200 mL/hr over 60 Minutes Intravenous Every 36 hours 12/21/18 1947     12/22/18 1000  meropenem (MERREM) 1 g in sodium chloride 0.9 % 100 mL IVPB     1 g 200 mL/hr over 30 Minutes Intravenous Every 12 hours 12/21/18 2007     12/21/18 2100  meropenem (MERREM) 1 g in sodium chloride 0.9 % 100 mL IVPB     1 g 200 mL/hr over 30 Minutes Intravenous  Once 12/21/18 2006 12/21/18 2217   12/21/18 1630  vancomycin (VANCOCIN) IVPB 1000 mg/200 mL premix     1,000 mg 200 mL/hr over 60 Minutes Intravenous  Once 12/21/18 1621 12/21/18 1917   12/21/18 1630  cefTRIAXone (ROCEPHIN) 2 g in sodium chloride 0.9 % 100 mL IVPB     2 g 200 mL/hr over 30 Minutes Intravenous  Once 12/21/18 1621 12/21/18  1916          Medications  Scheduled Meds: . amiodarone  200 mg Oral QPM  . atorvastatin  20 mg Oral QPC supper  . carvedilol  3.125 mg Oral BID  . enoxaparin (LOVENOX) injection  40 mg Subcutaneous QHS  . ferrous sulfate  325 mg Oral Q breakfast  . hydrALAZINE  25 mg Oral Q8H  . insulin aspart  0-9 Units Subcutaneous TID WC  . insulin glargine  11 Units Subcutaneous QPM  . isosorbide mononitrate  30 mg Oral Daily  . levothyroxine  112 mcg Oral Q0600  . mirabegron ER  25 mg Oral Daily  . sodium chloride flush  3 mL Intravenous Q12H  . [START ON 12/23/2018] tamsulosin  0.4 mg Oral Q M,W,F  . torsemide  60 mg Oral q1800  . torsemide  80 mg Oral QAC breakfast  . vitamin B-12  500 mcg Oral Daily   Continuous Infusions: . sodium chloride    . meropenem (MERREM) IV 1 g (12/22/18 0944)  . vancomycin     PRN Meds:.sodium chloride, HYDROcodone-acetaminophen, LORazepam, metolazone, nitroGLYCERIN, ondansetron **OR** ondansetron (ZOFRAN) IV, sodium chloride flush, temazepam      Subjective:   Broghan Pannone was seen and examined today.  No fevers. Still has LLE cellulitis, redness and warmth.  Patient denies dizziness, chest pain, shortness of breath, abdominal pain, N/V/D/C, new weakness, numbess, tingling. No acute events overnight.    Objective:   Vitals:   12/22/18 0315 12/22/18 0315 12/22/18 0520 12/22/18 1415  BP:  (!) 147/68 (!) 143/64 (!) 145/72  Pulse:  70 65 64  Resp:  18 18 16   Temp:  98.7 F (37.1 C) 98.2 F (36.8 C) 98.5 F (36.9 C)  TempSrc:  Oral Oral Oral  SpO2:  100% 99% 99%  Weight: 104.1 kg     Height: 5\' 10"  (1.778 m)       Intake/Output Summary (Last 24 hours) at 12/22/2018 1440 Last data filed at 12/22/2018 0520 Gross per 24 hour  Intake 403 ml  Output 300 ml  Net 103 ml     Wt Readings from Last 3 Encounters:  12/22/18 104.1 kg  08/01/18 108.9 kg  07/18/18 108.9 kg     Exam  General: Alert and oriented x 3, NAD  Eyes:   HEENT:   Atraumatic, normocephalic  Cardiovascular: S1 S2 auscultated, Regular rate and rhythm.  Respiratory: Clear to auscultation bilaterally,  Gastrointestinal: Soft, nontender, nondistended, + bowel sounds  Ext: no pedal edema bilaterally  Neuro: no new FND'S   Musculoskeletal: No digital cyanosis, clubbing  Skin: LLE cellulitis as  below    Psych: Normal affect and demeanor, alert and oriented x3         Data Reviewed:  I have personally reviewed following labs and imaging studies  Micro Results Recent Results (from the past 240 hour(s))  Blood Culture (routine x 2)     Status: None (Preliminary result)   Collection Time: 12/21/18  4:42 PM   Specimen: BLOOD  Result Value Ref Range Status   Specimen Description   Final    BLOOD RIGHT ANTECUBITAL Performed at [facility] [address] [place], [place] [address]    Special Requests   Final    BOTTLES DRAWN AEROBIC AND ANAEROBIC Blood Culture adequate volume Performed at [facility] [address] [place], [place] [address]    Culture   Final    NO GROWTH < 12 HOURS Performed at [facility] [address], [place], [place] [address]    Report Status PENDING  Incomplete  Blood Culture (routine x 2)     Status: None (Preliminary result)   Collection Time: 12/21/18  4:42 PM   Specimen: BLOOD  Result Value Ref Range Status   Specimen Description   Final    BLOOD LEFT ANTECUBITAL Performed at [facility] [address], [place], [place] [address]    Special Requests   Final    BOTTLES DRAWN AEROBIC AND ANAEROBIC Blood Culture adequate volume Performed at [facility] [address] [place], [place] [address]    Culture   Final    NO GROWTH < 12 HOURS Performed at [facility] [address] [place], [place] [address]    Report Status PENDING  Incomplete  SARS Coronavirus 2 (CEPHEID - Performed in [place] hospital  lab), Hosp Order     Status: None   Collection Time: 12/21/18  4:54 PM   Specimen: Urine, Clean Catch; Nasopharyngeal  Result Value Ref Range Status   SARS Coronavirus 2 NEGATIVE NEGATIVE Final    Comment: (NOTE) If result is NEGATIVE SARS-CoV-2 target nucleic acids are NOT DETECTED. The SARS-CoV-2 RNA is generally detectable in upper and lower  respiratory specimens during the acute phase of infection. The lowest  concentration of SARS-CoV-2 viral copies this assay can detect is 250  copies / mL. A negative result does not preclude SARS-CoV-2 infection  and should not be used as the sole basis for treatment or other  patient management decisions.  A negative result may occur with  improper specimen collection / handling, submission of specimen other  than nasopharyngeal swab, presence of viral mutation(s) within the  areas targeted by this assay, and inadequate number of viral copies  (<250 copies / mL). A negative result must be combined with clinical  observations, patient history, and epidemiological information. If result is POSITIVE SARS-CoV-2 target nucleic acids are DETECTED. The SARS-CoV-2 RNA is generally detectable in upper and lower  respiratory specimens dur ing the acute phase of infection.  Positive  results are indicative of active infection with SARS-CoV-2.  Clinical  correlation with patient history and other diagnostic information is  necessary to determine patient infection status.  Positive results do  not rule out bacterial infection or co-infection with other viruses. If result is PRESUMPTIVE POSTIVE SARS-CoV-2 nucleic acids MAY BE PRESENT.   A presumptive positive result was obtained on the submitted specimen  and confirmed on repeat testing.  While 2019 novel coronavirus  (SARS-CoV-2) nucleic acids may be  present in the submitted sample  additional confirmatory testing may be necessary for epidemiological  and / or clinical management purposes  to  differentiate between  SARS-CoV-2 and other Sarbecovirus currently known to infect humans.  If clinically indicated additional testing with an alternate test  methodology [phone]) is advised. The SARS-CoV-2 RNA is generally  detectable in upper and lower respiratory sp ecimens during the acute  phase of infection. The expected result is Negative. Fact Sheet for Patients:  StrictlyIdeas.no Fact Sheet for Healthcare Providers: BankingDealers.co.za This test is not yet approved or cleared by the [place] FDA and has been authorized for detection and/or diagnosis of SARS-CoV-2 by FDA under an Emergency Use Authorization (EUA).  This EUA will remain in effect (meaning this test can be used) for the duration of the COVID-19 declaration under Section 564(b)(1) of the Act, 21 U.S.C. section 360bbb-3(b)(1), unless the authorization is terminated or revoked sooner. Performed at [facility], [place] [address], [place], [place] [address]     Radiology Reports Dg Chest Port 1 View  Result Date: 12/21/2018 CLINICAL DATA:  Fever. Chills. Recent falls. EXAM: PORTABLE CHEST 1 VIEW COMPARISON:  Radiograph April 17, 2018 FINDINGS: Post median sternotomy.The cardiomediastinal contours are unchanged with mild cardiomegaly and aortic atherosclerosis. Vascular congestion without overt pulmonary edema. No consolidation, pleural effusion, or pneumothorax. No acute osseous abnormalities are seen. IMPRESSION: Cardiomegaly with vascular congestion. Electronically Signed   By: Keith Rake M.D.   On: 12/21/2018 18:08    Lab Data:  CBC: Recent Labs  Lab 12/21/18 1654 12/21/18 2239 12/22/18 0317  WBC 19.5* 17.4* 16.1*  NEUTROABS 18.1*  --   --   HGB 12.7* 12.1* 12.7*  HCT 37.8* 36.7* 39.4  MCV 101.9* 101.4* 103.7*  PLT 150 138* 144*   Basic Metabolic Panel: Recent Labs  Lab 12/21/18 1654 12/21/18 2239 12/22/18 0317  NA  134*  --  133*  K 4.1  --  4.2  CL 96*  --  97*  CO2 27  --  26  GLUCOSE 196*  --  143*  BUN 60*  --  64*  CREATININE 2.21* 2.23* 2.22*  CALCIUM 8.4*  --  8.6*   GFR: Estimated Creatinine Clearance: 32.6 mL/min (A) (by C-G formula based on SCr of 2.22 mg/dL (H)). Liver Function Tests: Recent Labs  Lab 12/21/18 1654 12/22/18 0317  AST 42* 41  ALT 36 35  ALKPHOS 58 55  BILITOT 1.0 0.9  PROT 6.2* 6.4*  ALBUMIN 2.7* 2.7*   No results for input(s): LIPASE, AMYLASE in the last 168 hours. No results for input(s): AMMONIA in the last 168 hours. Coagulation Profile: No results for input(s): INR, PROTIME in the last 168 hours. Cardiac Enzymes: No results for input(s): CKTOTAL, CKMB, CKMBINDEX, TROPONINI in the last 168 hours. BNP (last 3 results) Recent Labs    02/03/18 1217 02/24/18 1243  PROBNP 10,666* 17,251*   HbA1C: Recent Labs    12/22/18 0317  HGBA1C 7.5*   CBG: Recent Labs  Lab 12/22/18 0753 12/22/18 0924 12/22/18 1139  GLUCAP 137* 157* 189*   Lipid Profile: No results for input(s): CHOL, HDL, LDLCALC, TRIG, CHOLHDL, LDLDIRECT in the last 72 hours. Thyroid Function Tests: No results for input(s): TSH, T4TOTAL, FREET4, T3FREE, THYROIDAB in the last 72 hours. Anemia Panel: No results for input(s): VITAMINB12, FOLATE, FERRITIN, TIBC, IRON, RETICCTPCT in the last 72 hours. Urine analysis:    Component Value Date/Time   COLORURINE YELLOW 12/21/2018 1654   APPEARANCEUR CLOUDY (  A) 12/21/2018 1654   LABSPEC 1.011 12/21/2018 1654   PHURINE 5.0 12/21/2018 1654   GLUCOSEU NEGATIVE 12/21/2018 1654   HGBUR SMALL (A) 12/21/2018 1654   BILIRUBINUR NEGATIVE 12/21/2018 1654   KETONESUR NEGATIVE 12/21/2018 1654   PROTEINUR >=300 (A) 12/21/2018 1654   UROBILINOGEN 4.0 (H) 09/23/2011 1335   NITRITE NEGATIVE 12/21/2018 1654   LEUKOCYTESUR LARGE (A) 12/21/2018 1654     Ripudeep Rai M.D. [facility] 12/22/2018, 2:40 PM  Pager: [phone] Between 7am to 7pm -  call Pager - [phone]  After 7pm go to [email or web address] - password [email or web address]  Call night coverage person covering after 7pm

## 2018-12-23 LAB — CREATININE, SERUM
Creatinine, Ser: 2.22 mg/dL — ABNORMAL HIGH (ref 0.61–1.24)
GFR calc Af Amer: 31 mL/min — ABNORMAL LOW (ref >60)
GFR calc non Af Amer: 27 mL/min — ABNORMAL LOW (ref >60)

## 2018-12-23 LAB — GLUCOSE, CAPILLARY
Glucose-Capillary: 121 mg/dL — ABNORMAL HIGH (ref 70–99)
Glucose-Capillary: 167 mg/dL — ABNORMAL HIGH (ref 70–99)
Glucose-Capillary: 160 mg/dL — ABNORMAL HIGH (ref 70–99)

## 2018-12-23 LAB — URINE CULTURE: Culture: 90000 — AB

## 2018-12-23 NOTE — Progress Notes (Signed)
Pt was dangled on the side of bed and tolerate well.

## 2018-12-23 NOTE — Progress Notes (Signed)
The pt's Daughter called and requested for the MD to put in orders for a GI consult and Infectious disease consult based on the blood culture results. Daughter also requested a PT consult. Estill Cotta, MD was paged/notified.

## 2018-12-23 NOTE — Consult Note (Signed)
   [facility] Inpatient Consult   12/23/2018  [name] [birth year] [id number]  Patient chart has been reviewed for potential [facility] Care Management follow up needs based upon unplanned readmission risk score, 35%, extreme.   Per chart review, [facility]CO member followed by [facility] hospice services. No [facility] CM needs.   Netta Cedars, MSN, [facility] Liaison Nurse Mobile Phone [phone]  Toll free office [phone]

## 2018-12-23 NOTE — Progress Notes (Signed)
[facility] GIP RN Note 9am  This is a related and covered GIP admission of 12/22/18 with a [facility] diagnosis of Heart Failure with Chronic Kidney Disease per Dr. Orpah Melter.  Patient has had intermittent fever and generalized weakness for the last couple of days in the home then experienced several falls. Homecare RN visit for assessment of situation, patient encouraged to transport to ER for evaluation after patient/family requested full treatment and lab workup which hospice was unable to provide in the home. Patient was admitted with a hospital diagnosis of cellulitis and possible UTI.  Pt resting, required vicodin 5/325 mg PO this morning.  Spoke with his wife [name], she reports he is doing well.  Daughter Maretta Bees was able to speak with the MD yesterday to discuss concerns.  She is hopeful he will be able to come home in the next few days.    V/S:  97.9 oral, 160/64, HF 54, RR 16, SPO2 99% 2 lpm [place] I&O:  137/650 Abnormal lab work:  Cr 2.22, gfr 27; no other lab work ordered IVs/PRNs: merem 1g BID, vancocin 1,000 mg Q36H, vicodin 5/325 PO x 1   Problem List: Cellulitis:  merem 1g BID, vancocin 1,000 mg Q36H; afebrile WBCs trending down (no collection today)  19.5>17.4>16.1 UTI:  UA-cloudy, many bacteria, large leukocytes;  merem 1g BID, vancocin 1,000mg  Q36H  Family:  Updated  IDT:  Updated  GOC:  Pt is still a full code, however he does not want hospitalization   D/C planning:  Return home with hospice support  Once ready for d/c, please use [facility] non-emergency transport as they contract this service for our active hospice pts.  Venia Carbon RN, BSN, [facility] Liaison (in [place]) [phone]

## 2018-12-23 NOTE — Care Management Important Message (Signed)
Important Message  Patient Details IM Letter given to Kathrin Greathouse SW to present to the Patient Name: [name] MRN: [id number] Date of Birth: [birth year]   Medicare Important Message Given:  Yes     Kerin Salen 12/23/2018, 11:50 AM

## 2018-12-23 NOTE — Progress Notes (Addendum)
[facility]                                                                              Patient Demographics  [name], is a 78 y.o. male, DOB - Apr 08, 1941, IHK:[id number]  Admit date - 12/21/2018   Admitting Physician Elwyn Reach, MD  Outpatient Primary MD for the patient is Lavone Orn, MD  Outpatient specialists:   LOS - 2  days   Medical records reviewed and are as summarized below:    Chief Complaint  Patient presents with  . Extremity Weakness  . Fall       Brief summary   HPI per Dr Jonelle Sidle  Patient is a 78 year old male with ischemic cardiomyopathy with severe systolic dysfunction, diabetes, hypertension, hyperlipidemia, history of coronary artery bypass grafting, history of DVT, chronic kidney disease stage III, history of carotid stenosis, polyneuropathy, paroxysmal atrial fibrillation, history of GI bleed with symptomatic anemia who is currently on hospice care for palliative therapy brought in secondary to left lower extremity swelling redness and pain.  Symptoms have been going on for about 6 days.  He has had falls and weakness.  It started as a red spot like a dollar bill size and progressed so far.  It is now covering his left lower extremity all the way to his thigh.  No itching.  Is more warmth.  The right side is not affected.  Today he was transferring from chair to wheelchair when he could not stand and he fell.  Patient came in evaluated and found to have cellulitis.  Urinalysis also indicated possible UTI he is therefore being admitted for further work-up.  He denied any fever or chills no nausea vomiting or diarrhea.Marland Kitchen  COVID negative   Assessment & Plan    Principal Problem:   Left leg cellulitis - slightly better, but still has left leg erythema, warm  - Continue IV vancomycin and meropenem today. -Continue to elevate the leg and monitor closely  Active Problems:   Diabetes mellitus type 2 with complications (HCC)  -Hemoglobin A1c 7.5 -CBGs fairly stable -Continue sliding scale insulin, Lantus 11 units daily   History of CAD S/P CABG (coronary artery bypass graft), ischemic cardiomyopathy, chronic systolic CHF, EF 75-64% -Currently no complaints of chest pain or shortness of breath, follow I's and O's -Continue atorvastatin, Coreg, Imdur -Continue torsemide.  Currently euvolemic, I's and O's balanced.   Paroxysmal atrial fibrillation -Currently normal sinus rhythm, continue amiodarone - not on AC due to prior history of GI bleed   Klebsiella UTI, ESBL -Urine culture shows 90,000 colonies of Klebsiella pneumonia, ESBL positive -Continue IV meropenem  Essential hypertension -BP currently stable, continue Coreg, Imdur  CKD stage II - Cr stable, close to baseline  Hypothyroidism -Continue Synthroid  Generalized debility -PT OT evaluation   Code Status: Full CODE STATUS DVT Prophylaxis: Lovenox Family Communication: Discussed in detail with the patient, all imaging results, lab results explained to the patient and daughter on phone today.  Does not need GI consult (remote history of small pseudocyst).  Disposition Plan: Cont 24-48hrs of IV antibiotics, follow blood cultures. Will likely need PICC line  for IV meropenem for ESBL UTI versus oral ciprofloxacin  Time Spent in minutes  46mins   Procedures:  *  Consultants:     Antimicrobials:   Anti-infectives (From admission, onward)   Start     Dose/Rate Route Frequency Ordered Stop   12/22/18 1800  vancomycin (VANCOCIN) IVPB 1000 mg/200 mL premix     1,000 mg 200 mL/hr over 60 Minutes Intravenous Every 36 hours 12/21/18 1947     12/22/18 1000  meropenem (MERREM) 1 g in sodium chloride 0.9 % 100 mL IVPB     1 g 200 mL/hr over 30 Minutes Intravenous Every 12 hours 12/21/18 2007     12/21/18 2100  meropenem (MERREM) 1 g in sodium chloride 0.9 % 100 mL IVPB     1 g 200 mL/hr over 30 Minutes Intravenous  Once 12/21/18 2006 12/21/18  2217   12/21/18 1630  vancomycin (VANCOCIN) IVPB 1000 mg/200 mL premix     1,000 mg 200 mL/hr over 60 Minutes Intravenous  Once 12/21/18 1621 12/21/18 1917   12/21/18 1630  cefTRIAXone (ROCEPHIN) 2 g in sodium chloride 0.9 % 100 mL IVPB     2 g 200 mL/hr over 30 Minutes Intravenous  Once 12/21/18 1621 12/21/18 1916         Medications  Scheduled Meds: . amiodarone  200 mg Oral QPM  . atorvastatin  20 mg Oral QPC supper  . carvedilol  3.125 mg Oral BID  . enoxaparin (LOVENOX) injection  40 mg Subcutaneous QHS  . ferrous sulfate  325 mg Oral Q breakfast  . hydrALAZINE  25 mg Oral Q8H  . insulin aspart  0-9 Units Subcutaneous TID WC  . insulin glargine  11 Units Subcutaneous QPM  . isosorbide mononitrate  30 mg Oral Daily  . levothyroxine  112 mcg Oral Q0600  . mirabegron ER  25 mg Oral Daily  . sodium chloride flush  3 mL Intravenous Q12H  . tamsulosin  0.4 mg Oral Q M,W,F  . torsemide  60 mg Oral q1800  . torsemide  80 mg Oral QAC breakfast  . vitamin B-12  500 mcg Oral Daily   Continuous Infusions: . sodium chloride 10 mL/hr at 12/22/18 1700  . meropenem (MERREM) IV Stopped (12/23/18 0910)  . vancomycin 1,000 mg (12/22/18 1735)   PRN Meds:.sodium chloride, HYDROcodone-acetaminophen, LORazepam, metolazone, nitroGLYCERIN, ondansetron **OR** ondansetron (ZOFRAN) IV, sodium chloride flush, temazepam      Subjective:   [name] was seen and examined today.  Still has left lower leg redness, warmth, not a huge improvement today.  No fevers.  Patient denies dizziness, chest pain, shortness of breath, abdominal pain, N/V/D/C, new weakness, numbess, tingling. No acute events overnight.    Objective:   Vitals:   12/22/18 1415 12/22/18 2016 12/22/18 2114 12/23/18 0600  BP: (!) 145/72  138/73 (!) 160/64  Pulse: 64 (!) 58 (!) 59 (!) 54  Resp: 16  16   Temp: 98.5 F (36.9 C)  97.9 F (36.6 C)   TempSrc: Oral  Oral   SpO2: 99% 97% 99%   Weight:      Height:         Intake/Output Summary (Last 24 hours) at 12/23/2018 1344 Last data filed at 12/23/2018 1338 Gross per 24 hour  Intake 1613.58 ml  Output 2200 ml  Net -586.42 ml     Wt Readings from Last 3 Encounters:  12/22/18 104.1 kg  08/01/18 108.9 kg  07/18/18 108.9 kg   Physical Exam  General: Alert and oriented x 3, NAD  Eyes:   HEENT:  Atraumatic, normocephalic  Cardiovascular: S1 S2 clear, no murmurs, RRR. No pedal edema b/l  Respiratory: CTAB, no wheezing, rales or rhonchi  Gastrointestinal: Soft, nontender, nondistended, NBS  Ext: no pedal edema bilaterally  Neuro: no new deficits  Musculoskeletal: No cyanosis, clubbing  Skin: Left leg redness and warmth,  Psych: Normal affect and demeanor, alert and oriented x3          Data Reviewed:  I have personally reviewed following labs and imaging studies  Micro Results Recent Results (from the past 240 hour(s))  Blood Culture (routine x 2)     Status: None (Preliminary result)   Collection Time: 12/21/18  4:42 PM   Specimen: BLOOD  Result Value Ref Range Status   Specimen Description   Final    BLOOD RIGHT ANTECUBITAL Performed at [facility] [address] [place], [place] [address]    Special Requests   Final    BOTTLES DRAWN AEROBIC AND ANAEROBIC Blood Culture adequate volume Performed at [facility] [address], [place], [place] [address]    Culture   Final    NO GROWTH < 12 HOURS Performed at [facility] [address], [place], [place] [address]    Report Status PENDING  Incomplete  Blood Culture (routine x 2)     Status: None (Preliminary result)   Collection Time: 12/21/18  4:42 PM   Specimen: BLOOD  Result Value Ref Range Status   Specimen Description   Final    BLOOD LEFT ANTECUBITAL Performed at [facility] [address], [place], [place] [address]    Special Requests   Final    BOTTLES DRAWN AEROBIC AND ANAEROBIC  Blood Culture adequate volume Performed at [facility] [address] [place], [place] [address]    Culture   Final    NO GROWTH < 12 HOURS Performed at [facility] [address], [place], [place] [address]    Report Status PENDING  Incomplete  Urine culture     Status: Abnormal (Preliminary result)   Collection Time: 12/21/18  4:54 PM   Specimen: In/Out Cath Urine  Result Value Ref Range Status   Specimen Description   Final    IN/OUT CATH URINE Performed at [facility] [address], [place], [place] [address]    Special Requests   Final    NONE Performed at [facility], [place] [address] [place], [place] [address]    Culture (A)  Final    90,000 COLONIES/mL KLEBSIELLA PNEUMONIAE Confirmed Extended Spectrum Beta-Lactamase Producer (ESBL).  In bloodstream infections from ESBL organisms, carbapenems are preferred over piperacillin/tazobactam. They are shown to have a lower risk of mortality. CULTURE REINCUBATED FOR BETTER GROWTH Performed at [facility], [place] [address] [place], [place] [address]    Report Status PENDING  Incomplete   Organism ID, Bacteria KLEBSIELLA PNEUMONIAE (A)  Final      Susceptibility   Klebsiella pneumoniae - MIC*    AMPICILLIN >=32 RESISTANT Resistant     CEFAZOLIN >=64 RESISTANT Resistant     CEFTRIAXONE >=64 RESISTANT Resistant     CIPROFLOXACIN 1 SENSITIVE Sensitive     GENTAMICIN >=16 RESISTANT Resistant     IMIPENEM <=0.25 SENSITIVE Sensitive     NITROFURANTOIN 64 INTERMEDIATE Intermediate     TRIMETH/SULFA >=320 RESISTANT Resistant     AMPICILLIN/SULBACTAM >=  32 RESISTANT Resistant     PIP/TAZO 16 SENSITIVE Sensitive     Extended ESBL POSITIVE Resistant     * 90,000 COLONIES/mL KLEBSIELLA PNEUMONIAE  SARS Coronavirus 2 (CEPHEID - Performed in [facility] hospital lab), Hosp Order     Status: None   Collection Time: 12/21/18  4:54 PM   Specimen: Urine, Clean Catch;  Nasopharyngeal  Result Value Ref Range Status   SARS Coronavirus 2 NEGATIVE NEGATIVE Final    Comment: (NOTE) If result is NEGATIVE SARS-CoV-2 target nucleic acids are NOT DETECTED. The SARS-CoV-2 RNA is generally detectable in upper and lower  respiratory specimens during the acute phase of infection. The lowest  concentration of SARS-CoV-2 viral copies this assay can detect is 250  copies / mL. A negative result does not preclude SARS-CoV-2 infection  and should not be used as the sole basis for treatment or other  patient management decisions.  A negative result may occur with  improper specimen collection / handling, submission of specimen other  than nasopharyngeal swab, presence of viral mutation(s) within the  areas targeted by this assay, and inadequate number of viral copies  (<250 copies / mL). A negative result must be combined with clinical  observations, patient history, and epidemiological information. If result is POSITIVE SARS-CoV-2 target nucleic acids are DETECTED. The SARS-CoV-2 RNA is generally detectable in upper and lower  respiratory specimens dur ing the acute phase of infection.  Positive  results are indicative of active infection with SARS-CoV-2.  Clinical  correlation with patient history and other diagnostic information is  necessary to determine patient infection status.  Positive results do  not rule out bacterial infection or co-infection with other viruses. If result is PRESUMPTIVE POSTIVE SARS-CoV-2 nucleic acids MAY BE PRESENT.   A presumptive positive result was obtained on the submitted specimen  and confirmed on repeat testing.  While 2019 novel coronavirus  (SARS-CoV-2) nucleic acids may be present in the submitted sample  additional confirmatory testing may be necessary for epidemiological  and / or clinical management purposes  to differentiate between  SARS-CoV-2 and other Sarbecovirus currently known to infect humans.  If clinically  indicated additional testing with an alternate test  methodology [phone]) is advised. The SARS-CoV-2 RNA is generally  detectable in upper and lower respiratory sp ecimens during the acute  phase of infection. The expected result is Negative. Fact Sheet for Patients:  StrictlyIdeas.no Fact Sheet for Healthcare Providers: BankingDealers.co.za This test is not yet approved or cleared by the [place] FDA and has been authorized for detection and/or diagnosis of SARS-CoV-2 by FDA under an Emergency Use Authorization (EUA).  This EUA will remain in effect (meaning this test can be used) for the duration of the COVID-19 declaration under Section 564(b)(1) of the Act, 21 U.S.C. section 360bbb-3(b)(1), unless the authorization is terminated or revoked sooner. Performed at [facility], [place] [address], [place], [place] [address]     Radiology Reports Dg Chest Port 1 View  Result Date: 12/21/2018 CLINICAL DATA:  Fever. Chills. Recent falls. EXAM: PORTABLE CHEST 1 VIEW COMPARISON:  Radiograph April 17, 2018 FINDINGS: Post median sternotomy.The cardiomediastinal contours are unchanged with mild cardiomegaly and aortic atherosclerosis. Vascular congestion without overt pulmonary edema. No consolidation, pleural effusion, or pneumothorax. No acute osseous abnormalities are seen. IMPRESSION: Cardiomegaly with vascular congestion. Electronically Signed   By: Keith Rake M.D.   On: 12/21/2018 18:08    Lab Data:  CBC: Recent Labs  Lab 12/21/18 1654 12/21/18  2239 12/22/18 0317  WBC 19.5* 17.4* 16.1*  NEUTROABS 18.1*  --   --   HGB 12.7* 12.1* 12.7*  HCT 37.8* 36.7* 39.4  MCV 101.9* 101.4* 103.7*  PLT 150 138* 253*   Basic Metabolic Panel: Recent Labs  Lab 12/21/18 1654 12/21/18 2239 12/22/18 0317 12/23/18 0259  NA 134*  --  133*  --   K 4.1  --  4.2  --   CL 96*  --  97*  --   CO2 27  --  26  --   GLUCOSE  196*  --  143*  --   BUN 60*  --  64*  --   CREATININE 2.21* 2.23* 2.22* 2.22*  CALCIUM 8.4*  --  8.6*  --    GFR: Estimated Creatinine Clearance: 32.6 mL/min (A) (by C-G formula based on SCr of 2.22 mg/dL (H)). Liver Function Tests: Recent Labs  Lab 12/21/18 1654 12/22/18 0317  AST 42* 41  ALT 36 35  ALKPHOS 58 55  BILITOT 1.0 0.9  PROT 6.2* 6.4*  ALBUMIN 2.7* 2.7*   No results for input(s): LIPASE, AMYLASE in the last 168 hours. No results for input(s): AMMONIA in the last 168 hours. Coagulation Profile: No results for input(s): INR, PROTIME in the last 168 hours. Cardiac Enzymes: No results for input(s): CKTOTAL, CKMB, CKMBINDEX, TROPONINI in the last 168 hours. BNP (last 3 results) Recent Labs    02/03/18 1217 02/24/18 1243  PROBNP 10,666* 17,251*   HbA1C: Recent Labs    12/22/18 0317  HGBA1C 7.5*   CBG: Recent Labs  Lab 12/22/18 1139 12/22/18 1640 12/22/18 2109 12/23/18 0733 12/23/18 1202  GLUCAP 189* 164* 144* 121* 167*   Lipid Profile: No results for input(s): CHOL, HDL, LDLCALC, TRIG, CHOLHDL, LDLDIRECT in the last 72 hours. Thyroid Function Tests: No results for input(s): TSH, T4TOTAL, FREET4, T3FREE, THYROIDAB in the last 72 hours. Anemia Panel: No results for input(s): VITAMINB12, FOLATE, FERRITIN, TIBC, IRON, RETICCTPCT in the last 72 hours. Urine analysis:    Component Value Date/Time   COLORURINE YELLOW 12/21/2018 1654   APPEARANCEUR CLOUDY (A) 12/21/2018 1654   LABSPEC 1.011 12/21/2018 1654   PHURINE 5.0 12/21/2018 1654   GLUCOSEU NEGATIVE 12/21/2018 1654   HGBUR SMALL (A) 12/21/2018 1654   BILIRUBINUR NEGATIVE 12/21/2018 1654   KETONESUR NEGATIVE 12/21/2018 1654   PROTEINUR >=300 (A) 12/21/2018 1654   UROBILINOGEN 4.0 (H) 09/23/2011 1335   NITRITE NEGATIVE 12/21/2018 1654   LEUKOCYTESUR LARGE (A) 12/21/2018 1654     Asianna Brundage M.D. [facility] 12/23/2018, 1:44 PM  Pager: [phone] Between 7am to 7pm - call Pager -  [phone]  After 7pm go to [email or web address] - password [email or web address]  Call night coverage person covering after 7pm

## 2018-12-23 NOTE — Progress Notes (Signed)
The pt's Daughter called due to the pt's Wife being told she is not able to visit d/t the visiting restrictions at this time. The Daughter indicated that the day RN from yesterday received special approval from the assistant director for the pt's Wife to visit today. I was not told any of this in report and there were no notes indicating this was true. I reached out to the assistant director, Stanton Kidney, who said she did give special permission yesterday for the pt's Wife to visit d/t the pt being confused and a hospice pt. After speaking with Stanton Kidney, the Wife was given permission to visit today for 15 minutes. After the visit today, any visitations may not take place until Monday when the visitor restrictions are updated.

## 2018-12-24 LAB — CBC
HCT: 36 % — ABNORMAL LOW (ref 39.0–52.0)
Hemoglobin: 11.5 g/dL — ABNORMAL LOW (ref 13.0–17.0)
MCH: 33 pg (ref 26.0–34.0)
MCHC: 31.9 g/dL (ref 30.0–36.0)
MCV: 103.2 fL — ABNORMAL HIGH (ref 80.0–100.0)
Platelets: 189 10*3/uL (ref 150–400)
RBC: 3.49 MIL/uL — ABNORMAL LOW (ref 4.22–5.81)
RDW: 14.4 % (ref 11.5–15.5)
WBC: 7.4 10*3/uL (ref 4.0–10.5)
nRBC: 0 % (ref 0.0–0.2)

## 2018-12-24 LAB — BASIC METABOLIC PANEL
Anion gap: 11 (ref 5–15)
BUN: 69 mg/dL — ABNORMAL HIGH (ref 8–23)
CO2: 26 mmol/L (ref 22–32)
Calcium: 8.3 mg/dL — ABNORMAL LOW (ref 8.9–10.3)
Chloride: 97 mmol/L — ABNORMAL LOW (ref 98–111)
Creatinine, Ser: 2 mg/dL — ABNORMAL HIGH (ref 0.61–1.24)
GFR calc Af Amer: 36 mL/min — ABNORMAL LOW (ref >60)
GFR calc non Af Amer: 31 mL/min — ABNORMAL LOW (ref >60)
Glucose, Bld: 145 mg/dL — ABNORMAL HIGH (ref 70–99)
Potassium: 3.6 mmol/L (ref 3.5–5.1)
Sodium: 134 mmol/L — ABNORMAL LOW (ref 135–145)

## 2018-12-24 LAB — GLUCOSE, CAPILLARY
Glucose-Capillary: 144 mg/dL — ABNORMAL HIGH (ref 70–99)
Glucose-Capillary: 146 mg/dL — ABNORMAL HIGH (ref 70–99)
Glucose-Capillary: 150 mg/dL — ABNORMAL HIGH (ref 70–99)
Glucose-Capillary: 184 mg/dL — ABNORMAL HIGH (ref 70–99)
Glucose-Capillary: 201 mg/dL — ABNORMAL HIGH (ref 70–99)

## 2018-12-24 NOTE — Progress Notes (Signed)
[facility]                                                                              Patient Demographics  [name], is a 78 y.o. male, DOB - March 18, 1941, YDX:[id number]  Admit date - 12/21/2018   Admitting Physician Elwyn Reach, MD  Outpatient Primary MD for the patient is Lavone Orn, MD  Outpatient specialists:   LOS - 3  days   Medical records reviewed and are as summarized below:    Chief Complaint  Patient presents with  . Extremity Weakness  . Fall       Brief summary   HPI per Dr Jonelle Sidle  Patient is a 78 year old male with ischemic cardiomyopathy with severe systolic dysfunction, diabetes, hypertension, hyperlipidemia, history of coronary artery bypass grafting, history of DVT, chronic kidney disease stage III, history of carotid stenosis, polyneuropathy, paroxysmal atrial fibrillation, history of GI bleed with symptomatic anemia who is currently on hospice care for palliative therapy brought in secondary to left lower extremity swelling redness and pain.  Symptoms have been going on for about 6 days.  He has had falls and weakness.  It started as a red spot like a dollar bill size and progressed so far.  It is now covering his left lower extremity all the way to his thigh.  No itching.  Is more warmth.  The right side is not affected.  Today he was transferring from chair to wheelchair when he could not stand and he fell.  Patient came in evaluated and found to have cellulitis.  Urinalysis also indicated possible UTI he is therefore being admitted for further work-up.  He denied any fever or chills no nausea vomiting or diarrhea.Marland Kitchen  COVID negative   Assessment & Plan    Principal Problem:   Left leg cellulitis -Improving, left leg redness receding.  Still a little warm.   - Continue IV vancomycin and meropenem today, day #3  - Continue to elevate the leg and monitor closely  Active Problems:   Diabetes mellitus type 2 with complications  (HCC) -Hemoglobin A1c 7.5 -CBGs fairly stable, continue Lantus, sliding scale insulin  History of CAD S/P CABG (coronary artery bypass graft), ischemic cardiomyopathy, chronic systolic CHF, EF 72-09% -Currently no complaints of chest pain or shortness of breath, follow I's and O's -Continue atorvastatin, Coreg, Imdur -Continue torsemide, compensated, euvolemic    Paroxysmal atrial fibrillation -Currently normal sinus rhythm, continue amiodarone - not on AC due to prior history of GI bleed   Klebsiella UTI, ESBL - Urine culture shows 90,000 colonies of Klebsiella pneumonia, ESBL positive - Continue IV meropenem, day #3 today.  Blood cultures remain negative. - Discussed with ID on call, Dr. Johnnye Sima, recommended IV meropenem for total 5 days and then it is sufficient.  If patient is still continuing to have symptoms, may take oral ciprofloxacin for 5 days.  Essential hypertension -BP stable, cont Coreg, imdur  CKD stage III - Cr stable, close to baseline  Hypothyroidism -Continue Synthroid  Generalized debility -PT OT evaluation   Code Status: Full CODE STATUS DVT Prophylaxis: Lovenox Family Communication: Discussed in detail with the  patient, all imaging results, lab results explained to the patient and daughter.    Disposition Plan: will remain inpatient for 2 more days of IV antibiotics      Time Spent in minutes  47mins   Procedures:    Consultants:   ID  Antimicrobials:   Anti-infectives (From admission, onward)   Start     Dose/Rate Route Frequency Ordered Stop   12/22/18 1800  vancomycin (VANCOCIN) IVPB 1000 mg/200 mL premix     1,000 mg 200 mL/hr over 60 Minutes Intravenous Every 36 hours 12/21/18 1947     12/22/18 1000  meropenem (MERREM) 1 g in sodium chloride 0.9 % 100 mL IVPB     1 g 200 mL/hr over 30 Minutes Intravenous Every 12 hours 12/21/18 2007     12/21/18 2100  meropenem (MERREM) 1 g in sodium chloride 0.9 % 100 mL IVPB     1 g 200 mL/hr over  30 Minutes Intravenous  Once 12/21/18 2006 12/21/18 2217   12/21/18 1630  vancomycin (VANCOCIN) IVPB 1000 mg/200 mL premix     1,000 mg 200 mL/hr over 60 Minutes Intravenous  Once 12/21/18 1621 12/21/18 1917   12/21/18 1630  cefTRIAXone (ROCEPHIN) 2 g in sodium chloride 0.9 % 100 mL IVPB     2 g 200 mL/hr over 30 Minutes Intravenous  Once 12/21/18 1621 12/21/18 1916         Medications  Scheduled Meds: . amiodarone  200 mg Oral QPM  . atorvastatin  20 mg Oral QPC supper  . carvedilol  3.125 mg Oral BID  . enoxaparin (LOVENOX) injection  40 mg Subcutaneous QHS  . ferrous sulfate  325 mg Oral Q breakfast  . hydrALAZINE  25 mg Oral Q8H  . insulin aspart  0-9 Units Subcutaneous TID WC  . insulin glargine  11 Units Subcutaneous QPM  . isosorbide mononitrate  30 mg Oral Daily  . levothyroxine  112 mcg Oral Q0600  . mirabegron ER  25 mg Oral Daily  . sodium chloride flush  3 mL Intravenous Q12H  . tamsulosin  0.4 mg Oral Q M,W,F  . torsemide  60 mg Oral q1800  . torsemide  80 mg Oral QAC breakfast  . vitamin B-12  500 mcg Oral Daily   Continuous Infusions: . sodium chloride 10 mL/hr at 12/22/18 1700  . meropenem (MERREM) IV 1 g (12/24/18 0934)  . vancomycin 1,000 mg (12/24/18 0602)   PRN Meds:.sodium chloride, HYDROcodone-acetaminophen, LORazepam, metolazone, nitroGLYCERIN, ondansetron **OR** ondansetron (ZOFRAN) IV, sodium chloride flush, temazepam      Subjective:   Davyon Fisch was seen and examined today.  Left lower leg redness improving, not at baseline yet, much less edematous today.  No fevers.  Patient denies dizziness, chest pain, shortness of breath, abdominal pain, N/V/D/C, new weakness, numbess, tingling. No acute events overnight.    Objective:   Vitals:   12/23/18 0600 12/23/18 1434 12/23/18 2201 12/24/18 0432  BP: (!) 160/64 129/63 (!) 165/76 (!) 148/66  Pulse: (!) 54 (!) 55 (!) 58 (!) 52  Resp:  17 16 16   Temp:  97.6 F (36.4 C) (!) 97.5 F (36.4  C) 98.3 F (36.8 C)  TempSrc:  Oral Oral Oral  SpO2:  100% 99% 99%  Weight:      Height:        Intake/Output Summary (Last 24 hours) at 12/24/2018 1118 Last data filed at 12/24/2018 0915 Gross per 24 hour  Intake 1260.01 ml  Output 2650 ml  Net -1389.99 ml     Wt Readings from Last 3 Encounters:  12/22/18 104.1 kg  08/01/18 108.9 kg  07/18/18 108.9 kg   Physical Exam  General: Alert and oriented x 3, NAD  Eyes:   HEENT:  Atraumatic, normocephalic  Cardiovascular: S1 S2 clear, no murmurs, RRR. No pedal edema b/l  Respiratory: CTAB, no wheezing, rales or rhonchi  Gastrointestinal: Soft, nontender, nondistended, NBS  Ext: RLE no edema,   Neuro: no new deficits  Musculoskeletal: No cyanosis, clubbing  Skin: Left leg erythema, redness improving.  Edema improving  Psych: Normal affect and demeanor, alert and oriented x3           Data Reviewed:  I have personally reviewed following labs and imaging studies  Micro Results Recent Results (from the past 240 hour(s))  Blood Culture (routine x 2)     Status: None (Preliminary result)   Collection Time: 12/21/18  4:42 PM   Specimen: BLOOD  Result Value Ref Range Status   Specimen Description   Final    BLOOD RIGHT ANTECUBITAL Performed at [facility] [address], [place], [place] [address]    Special Requests   Final    BOTTLES DRAWN AEROBIC AND ANAEROBIC Blood Culture adequate volume Performed at [facility] [address], [place], [place] [address]    Culture   Final    NO GROWTH 3 DAYS Performed at [facility], [place] [address] [place], [place] [address]    Report Status PENDING  Incomplete  Blood Culture (routine x 2)     Status: None (Preliminary result)   Collection Time: 12/21/18  4:42 PM   Specimen: BLOOD  Result Value Ref Range Status   Specimen Description   Final    BLOOD LEFT ANTECUBITAL Performed at [facility] [place] [address], [place], [place] [address]    Special Requests   Final    BOTTLES DRAWN AEROBIC AND ANAEROBIC Blood Culture adequate volume Performed at [facility] [address] [place], [place] [address]    Culture   Final    NO GROWTH 3 DAYS Performed at [facility], [place] [address], [place], [place] [address]    Report Status PENDING  Incomplete  Urine culture     Status: Abnormal   Collection Time: 12/21/18  4:54 PM   Specimen: In/Out Cath Urine  Result Value Ref Range Status   Specimen Description   Final    IN/OUT CATH URINE Performed at [facility] [address] [place], [place] [address]    Special Requests   Final    NONE Performed at [facility], [place] [address], [place], [place] [address]    Culture (A)  Final    90,000 COLONIES/mL KLEBSIELLA PNEUMONIAE Confirmed Extended Spectrum Beta-Lactamase Producer (ESBL).  In bloodstream infections from ESBL organisms, carbapenems are preferred over piperacillin/tazobactam. They are shown to have a lower risk of mortality.    Report Status 12/23/2018 FINAL  Final   Organism ID, Bacteria KLEBSIELLA PNEUMONIAE (A)  Final      Susceptibility   Klebsiella pneumoniae - MIC*    AMPICILLIN >=32 RESISTANT Resistant     CEFAZOLIN >=64 RESISTANT Resistant     CEFTRIAXONE >=64 RESISTANT Resistant     CIPROFLOXACIN 1 SENSITIVE Sensitive     GENTAMICIN >=16 RESISTANT Resistant     IMIPENEM <=0.25 SENSITIVE Sensitive     NITROFURANTOIN 64 INTERMEDIATE Intermediate  TRIMETH/SULFA >=320 RESISTANT Resistant     AMPICILLIN/SULBACTAM >=32 RESISTANT Resistant     PIP/TAZO 16 SENSITIVE Sensitive     Extended ESBL POSITIVE Resistant     * 90,000 COLONIES/mL KLEBSIELLA PNEUMONIAE  SARS Coronavirus 2 (CEPHEID - Performed in [facility] hospital lab), Hosp Order     Status: None   Collection Time: 12/21/18  4:54 PM   Specimen: Urine, Clean Catch; Nasopharyngeal  Result Value  Ref Range Status   SARS Coronavirus 2 NEGATIVE NEGATIVE Final    Comment: (NOTE) If result is NEGATIVE SARS-CoV-2 target nucleic acids are NOT DETECTED. The SARS-CoV-2 RNA is generally detectable in upper and lower  respiratory specimens during the acute phase of infection. The lowest  concentration of SARS-CoV-2 viral copies this assay can detect is 250  copies / mL. A negative result does not preclude SARS-CoV-2 infection  and should not be used as the sole basis for treatment or other  patient management decisions.  A negative result may occur with  improper specimen collection / handling, submission of specimen other  than nasopharyngeal swab, presence of viral mutation(s) within the  areas targeted by this assay, and inadequate number of viral copies  (<250 copies / mL). A negative result must be combined with clinical  observations, patient history, and epidemiological information. If result is POSITIVE SARS-CoV-2 target nucleic acids are DETECTED. The SARS-CoV-2 RNA is generally detectable in upper and lower  respiratory specimens dur ing the acute phase of infection.  Positive  results are indicative of active infection with SARS-CoV-2.  Clinical  correlation with patient history and other diagnostic information is  necessary to determine patient infection status.  Positive results do  not rule out bacterial infection or co-infection with other viruses. If result is PRESUMPTIVE POSTIVE SARS-CoV-2 nucleic acids MAY BE PRESENT.   A presumptive positive result was obtained on the submitted specimen  and confirmed on repeat testing.  While 2019 novel coronavirus  (SARS-CoV-2) nucleic acids may be present in the submitted sample  additional confirmatory testing may be necessary for epidemiological  and / or clinical management purposes  to differentiate between  SARS-CoV-2 and other Sarbecovirus currently known to infect humans.  If clinically indicated additional testing with an  alternate test  methodology [phone]) is advised. The SARS-CoV-2 RNA is generally  detectable in upper and lower respiratory sp ecimens during the acute  phase of infection. The expected result is Negative. Fact Sheet for Patients:  StrictlyIdeas.no Fact Sheet for Healthcare Providers: BankingDealers.co.za This test is not yet approved or cleared by the [place] FDA and has been authorized for detection and/or diagnosis of SARS-CoV-2 by FDA under an Emergency Use Authorization (EUA).  This EUA will remain in effect (meaning this test can be used) for the duration of the COVID-19 declaration under Section 564(b)(1) of the Act, 21 U.S.C. section 360bbb-3(b)(1), unless the authorization is terminated or revoked sooner. Performed at [facility], [place] [address] [place], [place] [address]     Radiology Reports Dg Chest Port 1 View  Result Date: 12/21/2018 CLINICAL DATA:  Fever. Chills. Recent falls. EXAM: PORTABLE CHEST 1 VIEW COMPARISON:  Radiograph April 17, 2018 FINDINGS: Post median sternotomy.The cardiomediastinal contours are unchanged with mild cardiomegaly and aortic atherosclerosis. Vascular congestion without overt pulmonary edema. No consolidation, pleural effusion, or pneumothorax. No acute osseous abnormalities are seen. IMPRESSION: Cardiomegaly with vascular congestion. Electronically Signed   By: Keith Rake M.D.   On: 12/21/2018 18:08    Lab Data:  CBC: Recent Labs  Lab 12/21/18 1654 12/21/18 2239 12/22/18 0317 12/24/18 0315  WBC 19.5* 17.4* 16.1* 7.4  NEUTROABS 18.1*  --   --   --   HGB 12.7* 12.1* 12.7* 11.5*  HCT 37.8* 36.7* 39.4 36.0*  MCV 101.9* 101.4* 103.7* 103.2*  PLT 150 138* 148* 440   Basic Metabolic Panel: Recent Labs  Lab 12/21/18 1654 12/21/18 2239 12/22/18 0317 12/23/18 0259 12/24/18 0315  NA 134*  --  133*  --  134*  K 4.1  --  4.2  --  3.6  CL 96*  --  97*   --  97*  CO2 27  --  26  --  26  GLUCOSE 196*  --  143*  --  145*  BUN 60*  --  64*  --  69*  CREATININE 2.21* 2.23* 2.22* 2.22* 2.00*  CALCIUM 8.4*  --  8.6*  --  8.3*   GFR: Estimated Creatinine Clearance: 36.2 mL/min (A) (by C-G formula based on SCr of 2 mg/dL (H)). Liver Function Tests: Recent Labs  Lab 12/21/18 1654 12/22/18 0317  AST 42* 41  ALT 36 35  ALKPHOS 58 55  BILITOT 1.0 0.9  PROT 6.2* 6.4*  ALBUMIN 2.7* 2.7*   No results for input(s): LIPASE, AMYLASE in the last 168 hours. No results for input(s): AMMONIA in the last 168 hours. Coagulation Profile: No results for input(s): INR, PROTIME in the last 168 hours. Cardiac Enzymes: No results for input(s): CKTOTAL, CKMB, CKMBINDEX, TROPONINI in the last 168 hours. BNP (last 3 results) Recent Labs    02/03/18 1217 02/24/18 1243  PROBNP 10,666* 17,251*   HbA1C: Recent Labs    12/22/18 0317  HGBA1C 7.5*   CBG: Recent Labs  Lab 12/23/18 0733 12/23/18 1202 12/23/18 1643 12/24/18 0043 12/24/18 0750  GLUCAP 121* 167* 160* 146* 144*   Lipid Profile: No results for input(s): CHOL, HDL, LDLCALC, TRIG, CHOLHDL, LDLDIRECT in the last 72 hours. Thyroid Function Tests: No results for input(s): TSH, T4TOTAL, FREET4, T3FREE, THYROIDAB in the last 72 hours. Anemia Panel: No results for input(s): VITAMINB12, FOLATE, FERRITIN, TIBC, IRON, RETICCTPCT in the last 72 hours. Urine analysis:    Component Value Date/Time   COLORURINE YELLOW 12/21/2018 1654   APPEARANCEUR CLOUDY (A) 12/21/2018 1654   LABSPEC 1.011 12/21/2018 1654   PHURINE 5.0 12/21/2018 1654   GLUCOSEU NEGATIVE 12/21/2018 1654   HGBUR SMALL (A) 12/21/2018 1654   BILIRUBINUR NEGATIVE 12/21/2018 1654   KETONESUR NEGATIVE 12/21/2018 1654   PROTEINUR >=300 (A) 12/21/2018 1654   UROBILINOGEN 4.0 (H) 09/23/2011 1335   NITRITE NEGATIVE 12/21/2018 1654   LEUKOCYTESUR LARGE (A) 12/21/2018 1654     Shawnita Krizek M.D. [facility] 12/24/2018, 11:18  AM  Pager: [phone] Between 7am to 7pm - call Pager - [phone]  After 7pm go to [email or web address] - password [email or web address]  Call night coverage person covering after 7pm

## 2018-12-24 NOTE — Progress Notes (Signed)
Pharmacy Antibiotic Note  [name] is a 78 y.o. male admitted on 12/21/2018 with cellulitis and UTI.  Pharmacy has been consulted for meropenem and vancomycin dosing.  Pt has a history of ESBL infection.  Today, 12/24/18  WBC down to WNL at 7.4, Left Leg redness receding.   SCr 2.0 , CrCl ~36 mL/min  Ucx 90 K Klebsiella Sens to carbapenems  Afebrile/hypothermic  Plan:  Meropenem 1 g IV q12h  Vancomycin 1000 mg IV q36h  Goal AUC 400-550  To remain inpt for 2 more days IV abx per TR[facility] note  Height: 5\' 10"  (177.8 cm) Weight: 229 lb 8 oz (104.1 kg) IBW/kg (Calculated) : 73  Temp (24hrs), Avg:97.7 F (36.5 C), Min:97.5 F (36.4 C), Max:98.3 F (36.8 C)  Recent Labs  Lab 12/21/18 1654 12/21/18 2239 12/22/18 0317 12/23/18 0259 12/24/18 0315  WBC 19.5* 17.4* 16.1*  --  7.4  CREATININE 2.21* 2.23* 2.22* 2.22* 2.00*  LATICACIDVEN 1.6  --   --   --   --     Estimated Creatinine Clearance: 36.2 mL/min (A) (by C-G formula based on SCr of 2 mg/dL (H)).    Allergies  Allergen Reactions  . Amoxicillin-Pot Clavulanate Nausea Only    Has patient had a PCN reaction causing immediate rash, facial/tongue/throat swelling, SOB or lightheadedness with hypotension: No Has patient had a PCN reaction causing severe rash involving mucus membranes or skin necrosis: No Has patient had a PCN reaction that required hospitalization: No Has patient had a PCN reaction occurring within the last 10 years: No If all of the above answers are "NO", then may proceed with Cephalosporin use.   Marland Kitchen Morphine And Related Other (See Comments)    hallucinations  . Percocet [Oxycodone-Acetaminophen] Other (See Comments)    Agitation and Increased Confusion. Family asks to please not order this medication for him.   . Restoril [Temazepam] Other (See Comments)    Confusion and Agitation. Has taken in past, but has not reacted well. Family requests to not give this mediation.    Antimicrobials this  admission: 7/29 CTX x1 7/29 Vancomycin >>  7/29 Meropenem >> Dose adjustments this admission:--  Microbiology results: 7/29 BCx: NGTD 7/29 UCx: 90K ESBL Klebsiella pneumoniae: R all X cipro, imi, zosyn 7/29 COVID: neg  Thank you for allowing pharmacy to be a part of this patient's care.  Eudelia Bunch, Pharm.D [phone] 12/24/2018 2:24 PM

## 2018-12-24 NOTE — Progress Notes (Signed)
[facility] GIP RN Note 1:45pm  This is a related and covered GIP admission of 12/22/18 with a [facility] diagnosis of Heart Failure with Chronic Kidney Disease per Dr. Orpah Melter. Patient has had intermittent fever and generalized weakness for the last couple of days in the home then experienced several falls. Homecare RN visit for assessment of situation, patient encouraged to transport to ER for evaluation after patient/family requested full treatment and lab workup which hospice was unable to provide in the home. Patient was admitted with a hospital diagnosis of cellulitis and possible UTI.  Bedside RN (Amy) reports pt is resting well and having a good day. States pain this morning as pt received 1 dose of Norco with good relieve. Pt up today using the bedside commode with no reported issues. States pt's glucose was elevated prior to lunch at 201. Liaison spoke with pt's spouse today [name]) who reports pt is also doing well. States her daughter has spoken with the provider today with plans for pt to be discharged possibly Monday. Spouse was also informed pt's "cultures were negative for sepsis".  V/S: 97.5 oral, 175/64 BP, 67 HR, 16 RR, 93% 2 L N/C I&O: 360/ 863ml Condom Catheter Abnormal Labs: Na 134, Chloride 97, Glu 145, BUN 69, Cr 2.0, Ca 8.3, RBC 3.49, Hemo 11.5, HCT 36, MCV 103.2 IVs/PRN: Merrem 1 g Q12H , Vancocin 1000mg  Q36H/ Norco x1 dose  Problem List: Cellulitis:  merrem 1g BID, vancocin 1,000 mg Q36H UTI:   merrem 1g BID, vancocin 1,000mg  Q36H  Communication with PCG: Updated Gaspar Bidding)  Communication with IDG: Updated Randall Hiss, CSW/Amy, bedside RN)  D/C Planning: Return home with hospice support.  Upon discharge, please use [facility] non-emergency transport as they contract this services for our active hospice pts.  Vicente Serene, [facility] Liaison [phone]  [facility]) Hospice Liaison are under [facility]  [place] on [place].

## 2018-12-24 NOTE — Evaluation (Signed)
Physical Therapy Evaluation Patient Details Name: [name] MRN: [id number] DOB: 06-23-1940 Today's Date: 12/24/2018   History of Present Illness  78 yo male admitted with L LE cellulitis. Hx of DM, CABG, DVT, PVD, CHF, CKD, polyneuropathy, L drop foot, L fem-popliteal bypass, A fib, anemia  Clinical Impression  On eval, pt required Mod assist for bed mobility and Min assist to ambulate ~111 feet using a RW. Pt reported moderate L LE pain intermittently. He tolerated activity well. Per chart, plan is for home with hospice care. Will follow during hospital stay.     Follow Up Recommendations Supervision/Assistance - 24 hour(home with hospice)    Equipment Recommendations  None recommended by PT    Recommendations for Other Services       Precautions / Restrictions Precautions Precautions: Fall Precaution Comments: L drop foot Restrictions Weight Bearing Restrictions: No      Mobility  Bed Mobility Overal bed mobility: Needs Assistance Bed Mobility: Supine to Sit     Supine to sit: HOB elevated;Mod assist     General bed mobility comments: Increased time and effort. Assist for trunk and to scoot to EOB. Pt relied on bedrail.  Transfers Overall transfer level: Needs assistance Equipment used: Rolling walker (2 wheeled) Transfers: Sit to/from Stand Sit to Stand: Mod assist;From elevated surface         General transfer comment: Assist to rise, stabilize, control descent. VCs safety, technique, hand placement.  Ambulation/Gait Ambulation/Gait assistance: Min assist Gait Distance (Feet): 110 Feet Assistive device: Rolling walker (2 wheeled) Gait Pattern/deviations: Step-through pattern;Decreased stride length;Steppage;Decreased dorsiflexion - left     General Gait Details: Cues for safety, especially regarding L LE 2* foot drop. Slow, but fairly steady gait sped. Followed with recliner for safety. Pt denied lightheadedness/dizziness  Stairs             Wheelchair Mobility    Modified Rankin (Stroke Patients Only)       Balance Overall balance assessment: History of Falls;Needs assistance         Standing balance support: Bilateral upper extremity supported Standing balance-Leahy Scale: Poor                               Pertinent Vitals/Pain Pain Assessment: Faces Faces Pain Scale: Hurts little more Pain Location: L LE Pain Descriptors / Indicators: Grimacing;Guarding;Discomfort;Sore Pain Intervention(s): Monitored during session;Repositioned    Home Living Family/patient expects to be discharged to:: Private residence Living Arrangements: Spouse/significant other Available Help at Discharge: Family;Available 24 hours/day Type of Home: House Home Access: Ramped entrance     Home Layout: One level Home Equipment: Clinical cytogeneticist - 2 wheels;Grab bars - tub/shower      Prior Function Level of Independence: Needs assistance   Gait / Transfers Assistance Needed: RW for functional mobility  ADL's / Homemaking Assistance Needed: wife assisted with adls        Hand Dominance        Extremity/Trunk Assessment   Upper Extremity Assessment Upper Extremity Assessment: Generalized weakness    Lower Extremity Assessment Lower Extremity Assessment: LLE deficits/detail LLE Deficits / Details: L foot drop LLE Sensation: history of peripheral neuropathy    Cervical / Trunk Assessment Cervical / Trunk Assessment: Normal  Communication   Communication: No difficulties  Cognition Arousal/Alertness: Awake/alert Behavior During Therapy: WFL for tasks assessed/performed Overall Cognitive Status: Within Functional Limits for tasks assessed  General Comments      Exercises     Assessment/Plan    PT Assessment Patient needs continued PT services  PT Problem List Decreased strength;Decreased mobility;Decreased activity tolerance;Decreased  balance;Pain;Impaired sensation       PT Treatment Interventions DME instruction;Gait training;Therapeutic exercise;Therapeutic activities;Patient/family education;Balance training;Functional mobility training    PT Goals (Current goals can be found in the Care Plan section)  Acute Rehab PT Goals Patient Stated Goal: none stated PT Goal Formulation: With patient Time For Goal Achievement: 01/07/19 Potential to Achieve Goals: Good    Frequency Min 3X/week   Barriers to discharge        Co-evaluation               AM-PAC PT "6 Clicks" Mobility  Outcome Measure Help needed turning from your back to your side while in a flat bed without using bedrails?: A Little Help needed moving from lying on your back to sitting on the side of a flat bed without using bedrails?: A Lot Help needed moving to and from a bed to a chair (including a wheelchair)?: A Little Help needed standing up from a chair using your arms (e.g., wheelchair or bedside chair)?: A Lot Help needed to walk in hospital room?: A Little Help needed climbing 3-5 steps with a railing? : A Lot 6 Click Score: 15    End of Session Equipment Utilized During Treatment: Gait belt Activity Tolerance: Patient tolerated treatment well Patient left: in chair;with call bell/phone within reach   PT Visit Diagnosis: History of falling (Z91.81);Muscle weakness (generalized) (M62.81);Difficulty in walking, not elsewhere classified (R26.2)    Time: 1415-1440 PT Time Calculation (min) (ACUTE ONLY): 25 min   Charges:   PT Evaluation $PT Eval Moderate Complexity: 1 Mod PT Treatments $Gait Training: 8-22 mins        Weston Anna, PT Acute Rehabilitation Services Pager: [phone] Office: [phone]

## 2018-12-25 LAB — BASIC METABOLIC PANEL
Anion gap: 11 (ref 5–15)
BUN: 63 mg/dL — ABNORMAL HIGH (ref 8–23)
CO2: 28 mmol/L (ref 22–32)
Calcium: 8.5 mg/dL — ABNORMAL LOW (ref 8.9–10.3)
Chloride: 97 mmol/L — ABNORMAL LOW (ref 98–111)
Creatinine, Ser: 1.76 mg/dL — ABNORMAL HIGH (ref 0.61–1.24)
GFR calc Af Amer: 42 mL/min — ABNORMAL LOW (ref >60)
GFR calc non Af Amer: 36 mL/min — ABNORMAL LOW (ref >60)
Glucose, Bld: 129 mg/dL — ABNORMAL HIGH (ref 70–99)
Potassium: 3.5 mmol/L (ref 3.5–5.1)
Sodium: 136 mmol/L (ref 135–145)

## 2018-12-25 LAB — CBC
HCT: 37.9 % — ABNORMAL LOW (ref 39.0–52.0)
Hemoglobin: 12.5 g/dL — ABNORMAL LOW (ref 13.0–17.0)
MCH: 33.8 pg (ref 26.0–34.0)
MCHC: 33 g/dL (ref 30.0–36.0)
MCV: 102.4 fL — ABNORMAL HIGH (ref 80.0–100.0)
Platelets: 207 10*3/uL (ref 150–400)
RBC: 3.7 MIL/uL — ABNORMAL LOW (ref 4.22–5.81)
RDW: 14.1 % (ref 11.5–15.5)
WBC: 6 10*3/uL (ref 4.0–10.5)
nRBC: 0 % (ref 0.0–0.2)

## 2018-12-25 LAB — GLUCOSE, CAPILLARY
Glucose-Capillary: 131 mg/dL — ABNORMAL HIGH (ref 70–99)
Glucose-Capillary: 200 mg/dL — ABNORMAL HIGH (ref 70–99)
Glucose-Capillary: 173 mg/dL — ABNORMAL HIGH (ref 70–99)
Glucose-Capillary: 136 mg/dL — ABNORMAL HIGH (ref 70–99)

## 2018-12-25 NOTE — Progress Notes (Signed)
[facility]                                                                              Patient Demographics  [name], is a 78 y.o. male, DOB - 07/11/1940, MVH:[id number]  Admit date - 12/21/2018   Admitting Physician Elwyn Reach, MD  Outpatient Primary MD for the patient is Lavone Orn, MD  Outpatient specialists:   LOS - 4  days   Medical records reviewed and are as summarized below:    Chief Complaint  Patient presents with  . Extremity Weakness  . Fall       Brief summary   HPI per Dr Jonelle Sidle  Patient is a 78 year old male with ischemic cardiomyopathy with severe systolic dysfunction, diabetes, hypertension, hyperlipidemia, history of coronary artery bypass grafting, history of DVT, chronic kidney disease stage III, history of carotid stenosis, polyneuropathy, paroxysmal atrial fibrillation, history of GI bleed with symptomatic anemia who is currently on hospice care for palliative therapy brought in secondary to left lower extremity swelling redness and pain.  Symptoms have been going on for about 6 days.  He has had falls and weakness.  It started as a red spot like a dollar bill size and progressed so far.  It is now covering his left lower extremity all the way to his thigh.  No itching.  Is more warmth.  The right side is not affected.  Today he was transferring from chair to wheelchair when he could not stand and he fell.  Patient came in evaluated and found to have cellulitis.  Urinalysis also indicated possible UTI he is therefore being admitted for further work-up.  He denied any fever or chills no nausea vomiting or diarrhea.Marland Kitchen  COVID negative   Assessment & Plan    Principal Problem:   Left leg cellulitis -Improving, left leg redness receding.  Still a little warm.   -Continue IV vancomycin and meropenem, day #4 today -Left leg cellulitis improving, continue to elevate leg  Active Problems:   Diabetes mellitus type 2 with  complications (HCC) -Hemoglobin A1c 7.5 -1 reading elevated otherwise fairly stable.  Continue Lantus, sliding scale insulin  History of CAD S/P CABG (coronary artery bypass graft), ischemic cardiomyopathy, chronic systolic CHF, EF 84-13% -Currently no complaints of chest pain or shortness of breath, follow I's and O's -Continue atorvastatin, Coreg, Imdur -Continue torsemide, compensated, euvolemic   -Potassium replaced  Paroxysmal atrial fibrillation -Currently normal sinus rhythm, continue amiodarone - not on AC due to prior history of GI bleed   Klebsiella UTI, ESBL - Urine culture shows 90,000 colonies of Klebsiella pneumonia, ESBL positive -Continue IV meropenem, day #4 today.  Blood cultures negative.   -Discussed with ID, Dr. Johnnye Sima, recommended IV meropenem for total 5 days. If patient is still continuing to have symptoms, may take oral ciprofloxacin for 5 days.  Essential hypertension -BP stable, cont Coreg, imdur  CKD stage III -Creatinine improving, 1.76, close to baseline  Hypothyroidism -Continue Synthroid  Generalized debility -PT evaluation recommended home with hospice  Code Status: Full CODE STATUS DVT Prophylaxis: Lovenox Family Communication: Discussed in detail with the patient, all imaging results,  lab results explained to the patient and daughter on the phone yesterday  Disposition Plan: will DC home tomorrow after day #5 doses of meropenem and oral antibiotic for cellulitis   Time Spent in minutes  63mins   Procedures:    Consultants:   ID  Antimicrobials:   Anti-infectives (From admission, onward)   Start     Dose/Rate Route Frequency Ordered Stop   12/22/18 1800  vancomycin (VANCOCIN) IVPB 1000 mg/200 mL premix     1,000 mg 200 mL/hr over 60 Minutes Intravenous Every 36 hours 12/21/18 1947     12/22/18 1000  meropenem (MERREM) 1 g in sodium chloride 0.9 % 100 mL IVPB     1 g 200 mL/hr over 30 Minutes Intravenous Every 12 hours 12/21/18  2007     12/21/18 2100  meropenem (MERREM) 1 g in sodium chloride 0.9 % 100 mL IVPB     1 g 200 mL/hr over 30 Minutes Intravenous  Once 12/21/18 2006 12/21/18 2217   12/21/18 1630  vancomycin (VANCOCIN) IVPB 1000 mg/200 mL premix     1,000 mg 200 mL/hr over 60 Minutes Intravenous  Once 12/21/18 1621 12/21/18 1917   12/21/18 1630  cefTRIAXone (ROCEPHIN) 2 g in sodium chloride 0.9 % 100 mL IVPB     2 g 200 mL/hr over 30 Minutes Intravenous  Once 12/21/18 1621 12/21/18 1916         Medications  Scheduled Meds: . amiodarone  200 mg Oral QPM  . atorvastatin  20 mg Oral QPC supper  . carvedilol  3.125 mg Oral BID  . enoxaparin (LOVENOX) injection  40 mg Subcutaneous QHS  . ferrous sulfate  325 mg Oral Q breakfast  . hydrALAZINE  25 mg Oral Q8H  . insulin aspart  0-9 Units Subcutaneous TID WC  . insulin glargine  11 Units Subcutaneous QPM  . isosorbide mononitrate  30 mg Oral Daily  . levothyroxine  112 mcg Oral Q0600  . mirabegron ER  25 mg Oral Daily  . sodium chloride flush  3 mL Intravenous Q12H  . tamsulosin  0.4 mg Oral Q M,W,F  . torsemide  60 mg Oral q1800  . torsemide  80 mg Oral QAC breakfast  . vitamin B-12  500 mcg Oral Daily   Continuous Infusions: . sodium chloride 10 mL/hr at 12/22/18 1700  . meropenem (MERREM) IV 1 g (12/24/18 2130)  . vancomycin 1,000 mg (12/24/18 0602)   PRN Meds:.sodium chloride, HYDROcodone-acetaminophen, LORazepam, metolazone, nitroGLYCERIN, ondansetron **OR** ondansetron (ZOFRAN) IV, sodium chloride flush, temazepam      Subjective:   [name] was seen and examined today.  Left lower leg cellulitis, swelling, redness improving but not yet at baseline. No fevers.  Patient denies dizziness, chest pain, shortness of breath, abdominal pain, N/V/D/C, new weakness, numbess, tingling. No acute events overnight.    Objective:   Vitals:   12/24/18 1323 12/24/18 2139 12/24/18 2141 12/25/18 0559  BP: (!) 175/64 (!) 158/59  (!) 165/59   Pulse: (!) 56 (!) 50 60 (!) 53  Resp: 16 18  17   Temp: (!) 97.5 F (36.4 C) 97.7 F (36.5 C)  97.9 F (36.6 C)  TempSrc: Oral Oral  Oral  SpO2: 93% 100%  97%  Weight:      Height:        Intake/Output Summary (Last 24 hours) at 12/25/2018 1300 Last data filed at 12/25/2018 0940 Gross per 24 hour  Intake 1510 ml  Output 2800 ml  Net -1290  ml     Wt Readings from Last 3 Encounters:  12/22/18 104.1 kg  08/01/18 108.9 kg  07/18/18 108.9 kg    Physical Exam  General: Alert and oriented x 3, NAD  Eyes:  HEENT:  Scalp laceration with Steri-Strips  Cardiovascular: S1 S2 clear, no murmurs, RRR. No pedal edema b/l  Respiratory: CTAB, no wheezing, rales or rhonchi  Gastrointestinal: Soft, nontender, nondistended, NBS  Ext: no pedal edema bilaterally  Neuro: no new deficits  Musculoskeletal: No cyanosis, clubbing  Skin: Left lower extremity cellulitis now improving  Psych: Normal affect and demeanor, alert and oriented x3           Data Reviewed:  I have personally reviewed following labs and imaging studies  Micro Results Recent Results (from the past 240 hour(s))  Blood Culture (routine x 2)     Status: None (Preliminary result)   Collection Time: 12/21/18  4:42 PM   Specimen: BLOOD  Result Value Ref Range Status   Specimen Description   Final    BLOOD RIGHT ANTECUBITAL Performed at [facility] [address], [place], [place] [address]    Special Requests   Final    BOTTLES DRAWN AEROBIC AND ANAEROBIC Blood Culture adequate volume Performed at [facility] [address], [place], [place] [address]    Culture   Final    NO GROWTH 4 DAYS Performed at [facility], [place] [address] [place], [place] [address]    Report Status PENDING  Incomplete  Blood Culture (routine x 2)     Status: None (Preliminary result)   Collection Time: 12/21/18  4:42 PM   Specimen: BLOOD  Result Value Ref Range Status    Specimen Description   Final    BLOOD LEFT ANTECUBITAL Performed at [facility] [address] [place], [place] [address]    Special Requests   Final    BOTTLES DRAWN AEROBIC AND ANAEROBIC Blood Culture adequate volume Performed at [facility] [address], [place], [place] [address]    Culture   Final    NO GROWTH 4 DAYS Performed at [facility], [place] [address] [place], [place] [address]    Report Status PENDING  Incomplete  Urine culture     Status: Abnormal   Collection Time: 12/21/18  4:54 PM   Specimen: In/Out Cath Urine  Result Value Ref Range Status   Specimen Description   Final    IN/OUT CATH URINE Performed at [facility] [address], [place], [place] [address]    Special Requests   Final    NONE Performed at [facility], [place] [address] [place], [place] [address]    Culture (A)  Final    90,000 COLONIES/mL KLEBSIELLA PNEUMONIAE Confirmed Extended Spectrum Beta-Lactamase Producer (ESBL).  In bloodstream infections from ESBL organisms, carbapenems are preferred over piperacillin/tazobactam. They are shown to have a lower risk of mortality.    Report Status 12/23/2018 FINAL  Final   Organism ID, Bacteria KLEBSIELLA PNEUMONIAE (A)  Final      Susceptibility   Klebsiella pneumoniae - MIC*    AMPICILLIN >=32 RESISTANT Resistant     CEFAZOLIN >=64 RESISTANT Resistant     CEFTRIAXONE >=64 RESISTANT Resistant     CIPROFLOXACIN 1 SENSITIVE Sensitive     GENTAMICIN >=16 RESISTANT Resistant     IMIPENEM <=0.25 SENSITIVE Sensitive     NITROFURANTOIN 64 INTERMEDIATE Intermediate  TRIMETH/SULFA >=320 RESISTANT Resistant     AMPICILLIN/SULBACTAM >=32 RESISTANT Resistant     PIP/TAZO 16 SENSITIVE Sensitive     Extended ESBL POSITIVE Resistant     * 90,000 COLONIES/mL KLEBSIELLA PNEUMONIAE  SARS Coronavirus 2 (CEPHEID - Performed in [employer] hospital lab), Hosp Order      Status: None   Collection Time: 12/21/18  4:54 PM   Specimen: Urine, Clean Catch; Nasopharyngeal  Result Value Ref Range Status   SARS Coronavirus 2 NEGATIVE NEGATIVE Final    Comment: (NOTE) If result is NEGATIVE SARS-CoV-2 target nucleic acids are NOT DETECTED. The SARS-CoV-2 RNA is generally detectable in upper and lower  respiratory specimens during the acute phase of infection. The lowest  concentration of SARS-CoV-2 viral copies this assay can detect is 250  copies / mL. A negative result does not preclude SARS-CoV-2 infection  and should not be used as the sole basis for treatment or other  patient management decisions.  A negative result may occur with  improper specimen collection / handling, submission of specimen other  than nasopharyngeal swab, presence of viral mutation(s) within the  areas targeted by this assay, and inadequate number of viral copies  (<250 copies / mL). A negative result must be combined with clinical  observations, patient history, and epidemiological information. If result is POSITIVE SARS-CoV-2 target nucleic acids are DETECTED. The SARS-CoV-2 RNA is generally detectable in upper and lower  respiratory specimens dur ing the acute phase of infection.  Positive  results are indicative of active infection with SARS-CoV-2.  Clinical  correlation with patient history and other diagnostic information is  necessary to determine patient infection status.  Positive results do  not rule out bacterial infection or co-infection with other viruses. If result is PRESUMPTIVE POSTIVE SARS-CoV-2 nucleic acids MAY BE PRESENT.   A presumptive positive result was obtained on the submitted specimen  and confirmed on repeat testing.  While 2019 novel coronavirus  (SARS-CoV-2) nucleic acids may be present in the submitted sample  additional confirmatory testing may be necessary for epidemiological  and / or clinical management purposes  to differentiate between   SARS-CoV-2 and other Sarbecovirus currently known to infect humans.  If clinically indicated additional testing with an alternate test  methodology [phone]) is advised. The SARS-CoV-2 RNA is generally  detectable in upper and lower respiratory sp ecimens during the acute  phase of infection. The expected result is Negative. Fact Sheet for Patients:  StrictlyIdeas.no Fact Sheet for Healthcare Providers: BankingDealers.co.za This test is not yet approved or cleared by the [place] FDA and has been authorized for detection and/or diagnosis of SARS-CoV-2 by FDA under an Emergency Use Authorization (EUA).  This EUA will remain in effect (meaning this test can be used) for the duration of the COVID-19 declaration under Section 564(b)(1) of the Act, 21 U.S.C. section 360bbb-3(b)(1), unless the authorization is terminated or revoked sooner. Performed at [facility], [place] [address], [place], [place] [address]     Radiology Reports Dg Chest Port 1 View  Result Date: 12/21/2018 CLINICAL DATA:  Fever. Chills. Recent falls. EXAM: PORTABLE CHEST 1 VIEW COMPARISON:  Radiograph April 17, 2018 FINDINGS: Post median sternotomy.The cardiomediastinal contours are unchanged with mild cardiomegaly and aortic atherosclerosis. Vascular congestion without overt pulmonary edema. No consolidation, pleural effusion, or pneumothorax. No acute osseous abnormalities are seen. IMPRESSION: Cardiomegaly with vascular congestion. Electronically Signed   By: Keith Rake M.D.   On: 12/21/2018 18:08    Lab Data:  CBC: Recent Labs  Lab 12/21/18 1654 12/21/18 2239 12/22/18 0317 12/24/18 0315 12/25/18 0415  WBC 19.5* 17.4* 16.1* 7.4 6.0  NEUTROABS 18.1*  --   --   --   --   HGB 12.7* 12.1* 12.7* 11.5* 12.5*  HCT 37.8* 36.7* 39.4 36.0* 37.9*  MCV 101.9* 101.4* 103.7* 103.2* 102.4*  PLT 150 138* 148* 189 646   Basic Metabolic Panel:  Recent Labs  Lab 12/21/18 1654 12/21/18 2239 12/22/18 0317 12/23/18 0259 12/24/18 0315 12/25/18 0415  NA 134*  --  133*  --  134* 136  K 4.1  --  4.2  --  3.6 3.5  CL 96*  --  97*  --  97* 97*  CO2 27  --  26  --  26 28  GLUCOSE 196*  --  143*  --  145* 129*  BUN 60*  --  64*  --  69* 63*  CREATININE 2.21* 2.23* 2.22* 2.22* 2.00* 1.76*  CALCIUM 8.4*  --  8.6*  --  8.3* 8.5*   GFR: Estimated Creatinine Clearance: 41.1 mL/min (A) (by C-G formula based on SCr of 1.76 mg/dL (H)). Liver Function Tests: Recent Labs  Lab 12/21/18 1654 12/22/18 0317  AST 42* 41  ALT 36 35  ALKPHOS 58 55  BILITOT 1.0 0.9  PROT 6.2* 6.4*  ALBUMIN 2.7* 2.7*   No results for input(s): LIPASE, AMYLASE in the last 168 hours. No results for input(s): AMMONIA in the last 168 hours. Coagulation Profile: No results for input(s): INR, PROTIME in the last 168 hours. Cardiac Enzymes: No results for input(s): CKTOTAL, CKMB, CKMBINDEX, TROPONINI in the last 168 hours. BNP (last 3 results) Recent Labs    02/03/18 1217 02/24/18 1243  PROBNP 10,666* 17,251*   HbA1C: No results for input(s): HGBA1C in the last 72 hours. CBG: Recent Labs  Lab 12/24/18 1219 12/24/18 1628 12/24/18 2125 12/25/18 0805 12/25/18 1204  GLUCAP 201* 184* 150* 131* 200*   Lipid Profile: No results for input(s): CHOL, HDL, LDLCALC, TRIG, CHOLHDL, LDLDIRECT in the last 72 hours. Thyroid Function Tests: No results for input(s): TSH, T4TOTAL, FREET4, T3FREE, THYROIDAB in the last 72 hours. Anemia Panel: No results for input(s): VITAMINB12, FOLATE, FERRITIN, TIBC, IRON, RETICCTPCT in the last 72 hours. Urine analysis:    Component Value Date/Time   COLORURINE YELLOW 12/21/2018 1654   APPEARANCEUR CLOUDY (A) 12/21/2018 1654   LABSPEC 1.011 12/21/2018 1654   PHURINE 5.0 12/21/2018 1654   GLUCOSEU NEGATIVE 12/21/2018 1654   HGBUR SMALL (A) 12/21/2018 1654   BILIRUBINUR NEGATIVE 12/21/2018 1654   KETONESUR NEGATIVE  12/21/2018 1654   PROTEINUR >=300 (A) 12/21/2018 1654   UROBILINOGEN 4.0 (H) 09/23/2011 1335   NITRITE NEGATIVE 12/21/2018 1654   LEUKOCYTESUR LARGE (A) 12/21/2018 1654     Ripudeep Rai M.D. [facility] 12/25/2018, 1:00 PM  Pager: [phone] Between 7am to 7pm - call Pager - [phone]  After 7pm go to [email or web address] - password [email or web address]  Call night coverage person covering after 7pm

## 2018-12-25 NOTE — Progress Notes (Signed)
[id number] [facility] ([facility]) GIP RN Note 1100 am  This is a related and covered GIP admission of 12/22/18 with a [facility] diagnosis of Heart Failure with Chronic Kidney Disease per Dr. Orpah Melter. Patient has had intermittent fever and generalized weakness for the last couple of days in the home then experiencedseveral falls.Homecare RN visit for assessment of situation,patient encouraged to transport to ER for evaluation after patient/family requested full treatment and lab workup whichhospice was unable to provide in the home. Patient was admittedwith ahospital diagnosis of cellulitis and possible UTI.  Bedside RN (Amy) reports pt is resting well and continues to do well with no new issues. States pt had pain this morning @ 4 AM and received 1 dose of Norco with good relieve. No other PRN medications reported. RN states the doctor has indicated pt will received one additional dose of the IV antibiotics tomorrow with plans to discharge that evening.  Liaison attempted to contact spouse today [name]) at both the home and cell however unsuccessful and unable to leave a voice message.  V/S: 97.9 oral, 165/59 BP, 53 HR, 17 RR, SpO2 97%  N/C  I&O: 240/ 900 ml Condom Catheter (730ml) Abnormal Labs: Chloride 97, Glu 129, BUN 63, Cr 1.76, Ca 8.5, RBC 3.70, Hemo 12.5, HCT 37.9, MCV 102.4 IVs/PRN: Merrem 1 g Q12H , Vancocin 1000 mg Q36H/ Norco x1 dose @4am   Problem List: Cellulitis and UTI: Merrem 1g BID, Vancocin 1,000 mg Q36H  Communication with [facility]: Attempts unsuccessful and no voice-mail to left a HIPPA message with update. Will follow up accordingly.  Communication with [facility]: Updated (Crystal, CSW/Amy, bedside RN)  D/C Planning: Return home with hospice support.  Upon discharge, please use [facility] non-emergency transport as they contract this services for our active hospice pts.  Vicente Serene, [facility] Liaison [phone]  [facility]) Hospice  Liaison are under [facility] [place] on [place].

## 2018-12-26 LAB — CULTURE, BLOOD (ROUTINE X 2)
Culture: NO GROWTH
Culture: NO GROWTH
Special Requests: ADEQUATE
Special Requests: ADEQUATE

## 2018-12-26 LAB — BASIC METABOLIC PANEL
Anion gap: 13 (ref 5–15)
BUN: 61 mg/dL — ABNORMAL HIGH (ref 8–23)
CO2: 27 mmol/L (ref 22–32)
Calcium: 8.5 mg/dL — ABNORMAL LOW (ref 8.9–10.3)
Chloride: 98 mmol/L (ref 98–111)
Creatinine, Ser: 1.74 mg/dL — ABNORMAL HIGH (ref 0.61–1.24)
GFR calc Af Amer: 42 mL/min — ABNORMAL LOW (ref >60)
GFR calc non Af Amer: 36 mL/min — ABNORMAL LOW (ref >60)
Glucose, Bld: 98 mg/dL (ref 70–99)
Potassium: 3.4 mmol/L — ABNORMAL LOW (ref 3.5–5.1)
Sodium: 138 mmol/L (ref 135–145)

## 2018-12-26 LAB — CBC
HCT: 36.8 % — ABNORMAL LOW (ref 39.0–52.0)
Hemoglobin: 11.9 g/dL — ABNORMAL LOW (ref 13.0–17.0)
MCH: 33.3 pg (ref 26.0–34.0)
MCHC: 32.3 g/dL (ref 30.0–36.0)
MCV: 103.1 fL — ABNORMAL HIGH (ref 80.0–100.0)
Platelets: 217 10*3/uL (ref 150–400)
RBC: 3.57 MIL/uL — ABNORMAL LOW (ref 4.22–5.81)
RDW: 14.1 % (ref 11.5–15.5)
WBC: 7.9 10*3/uL (ref 4.0–10.5)
nRBC: 0 % (ref 0.0–0.2)

## 2018-12-26 LAB — GLUCOSE, CAPILLARY
Glucose-Capillary: 78 mg/dL (ref 70–99)
Glucose-Capillary: 143 mg/dL — ABNORMAL HIGH (ref 70–99)

## 2018-12-26 MED ORDER — POTASSIUM CHLORIDE CRYS ER 20 MEQ PO TBCR
40.0000 meq | EXTENDED_RELEASE_TABLET | Freq: Once | ORAL | Status: AC
  Administered 2018-12-26: 40 meq via ORAL
  Filled 2018-12-26: qty 2

## 2018-12-26 MED ORDER — NYSTATIN 100000 UNIT/GM EX POWD: Freq: Three times a day (TID) | CUTANEOUS | 3 refills | Status: AC

## 2018-12-26 MED ORDER — DOXYCYCLINE HYCLATE 100 MG PO CAPS: 100.0000 mg | ORAL_CAPSULE | Freq: Two times a day (BID) | ORAL | 0 refills | Status: AC

## 2018-12-26 MED ORDER — AMIODARONE HCL 200 MG PO TABS: 200.0000 mg | ORAL_TABLET | Freq: Every evening | ORAL | Status: DC

## 2018-12-26 NOTE — Discharge Summary (Addendum)
Physician Discharge Summary   Patient ID: [name] MRN: [id number] DOB/AGE: 12/17/1940 78 y.o.  Admit date: 12/21/2018 Discharge date: 12/26/2018  Primary Care Physician:  Lavone Orn, MD   Recommendations for Outpatient Follow-up:  1. Follow up with PCP in 1-2 weeks  Home Health: Patient is returning home with hospice Equipment/Devices:   Discharge Condition: stable  CODE STATUS: FUL Diet recommendation: Carb modified diet   Discharge Diagnoses:   . Klebsiella ESBL UTI (urinary tract infection) . Left leg cellulitis . Essential hypertension .  chronic combined systolic and diastolic CHF (congestive heart failure) ([place]) . Polyneuropathy associated with underlying disease ([place]) . Coronary artery disease involving coronary bypass graft of native heart without angina pectoris . Paroxysmal atrial fibrillation (HCC) . Generalized debility . Diabetes mellitus type 2 with complications, IDDM (HCC)   Consults: ID, Dr. Johnnye Sima via phone consultation    Allergies:   Allergies  Allergen Reactions  . Amoxicillin-Pot Clavulanate Nausea Only    Has patient had a PCN reaction causing immediate rash, facial/tongue/throat swelling, SOB or lightheadedness with hypotension: No Has patient had a PCN reaction causing severe rash involving mucus membranes or skin necrosis: No Has patient had a PCN reaction that required hospitalization: No Has patient had a PCN reaction occurring within the last 10 years: No If all of the above answers are "NO", then may proceed with Cephalosporin use.   Marland Kitchen Morphine And Related Other (See Comments)    hallucinations  . Percocet [Oxycodone-Acetaminophen] Other (See Comments)    Agitation and Increased Confusion. Family asks to please not order this medication for him.   . Restoril [Temazepam] Other (See Comments)    Confusion and Agitation. Has taken in past, but has not reacted well. Family requests to not give this mediation.      DISCHARGE  MEDICATIONS: Allergies as of 12/26/2018      Reactions   Amoxicillin-pot Clavulanate Nausea Only   Has patient had a PCN reaction causing immediate rash, facial/tongue/throat swelling, SOB or lightheadedness with hypotension: No Has patient had a PCN reaction causing severe rash involving mucus membranes or skin necrosis: No Has patient had a PCN reaction that required hospitalization: No Has patient had a PCN reaction occurring within the last 10 years: No If all of the above answers are "NO", then may proceed with Cephalosporin use.   Morphine And Related Other (See Comments)   hallucinations   Percocet [oxycodone-acetaminophen] Other (See Comments)   Agitation and Increased Confusion. Family asks to please not order this medication for him.    Restoril [temazepam] Other (See Comments)   Confusion and Agitation. Has taken in past, but has not reacted well. Family requests to not give this mediation.       Medication List    TAKE these medications   acetaminophen 500 MG tablet Commonly known as: TYLENOL Take 1,000 mg by mouth every 6 (six) hours as needed for fever or headache (pain).   amiodarone 200 MG tablet Commonly known as: PACERONE Take 1 tablet (200 mg total) by mouth every evening.   atorvastatin 20 MG tablet Commonly known as: LIPITOR Take 20 mg by mouth daily after supper.   carvedilol 3.125 MG tablet Commonly known as: COREG Take 1 tablet (3.125 mg total) by mouth 2 (two) times daily.   doxycycline 100 MG capsule Commonly known as: Vibramycin Take 1 capsule (100 mg total) by mouth 2 (two) times daily for 7 days.   ferrous sulfate 325 (65 FE) MG tablet Take 1  tablet (325 mg total) by mouth 2 (two) times daily with a meal. What changed: when to take this   folic acid 1 MG tablet Commonly known as: FOLVITE Take 1 tablet (1 mg total) by mouth daily.   hydrALAZINE 25 MG tablet Commonly known as: APRESOLINE Take 1 tablet (25 mg total) by mouth every 8 (eight)  hours. What changed: Another medication with the same name was removed. Continue taking this medication, and follow the directions you see here.   HYDROcodone-acetaminophen 5-325 MG tablet Commonly known as: NORCO/VICODIN Take 1 tablet by mouth every 6 (six) hours as needed for moderate pain or severe pain.   isosorbide mononitrate 30 MG 24 hr tablet Commonly known as: IMDUR Take 1 tablet (30 mg total) by mouth daily. What changed: when to take this   levothyroxine 112 MCG tablet Commonly known as: SYNTHROID Take 112 mcg by mouth daily before breakfast.   LORazepam 0.5 MG tablet Commonly known as: ATIVAN Take 0.5 mg by mouth at bedtime as needed for anxiety.   methylcellulose 1 % ophthalmic solution Commonly known as: ARTIFICIAL TEARS Place 1 drop into both eyes 2 (two) times daily as needed (dry eyes).   metolazone 5 MG tablet Commonly known as: ZAROXOLYN Take 5 mg by mouth daily as needed (edema).   multivitamin with minerals Tabs tablet Take 1 tablet by mouth daily. What changed: when to take this   Myrbetriq 25 MG Tb24 tablet Generic drug: mirabegron ER Take 25 mg by mouth daily.   nitroGLYCERIN 0.4 MG SL tablet Commonly known as: NITROSTAT PLACE 1 TABLET UNDER TONGUE EVERY 5 MINUTES AS NEEDED FOR CHEST PAIN. What changed: See the new instructions.   nystatin powder Commonly known as: MYCOSTATIN/NYSTOP Apply topically 3 (three) times daily. APPLY TO SKIN FOLDS   pantoprazole 40 MG tablet Commonly known as: Protonix Take 1 tablet (40 mg total) by mouth daily. What changed:   when to take this  additional instructions   polyethylene glycol powder 17 GM/SCOOP powder Commonly known as: GLYCOLAX/MIRALAX Take 0.5 Containers by mouth daily as needed for mild constipation.   Potassium Chloride ER 20 MEQ Tbcr Take 1 tablet by mouth 2 (two) times a day.   pseudoephedrine-guaifenesin 60-600 MG 12 hr tablet Commonly known as: MUCINEX D Take 1 tablet by mouth  every 12 (twelve) hours as needed for congestion.   Senna S 8.6-50 MG tablet Generic drug: senna-docusate Take 1 tablet by mouth 2 (two) times daily as needed for mild constipation.   tamsulosin 0.4 MG Caps capsule Commonly known as: FLOMAX Take 0.4 mg by mouth every Monday, Wednesday, and Friday.   temazepam 15 MG capsule Commonly known as: RESTORIL Take 15 mg by mouth at bedtime as needed for sleep.   torsemide 20 MG tablet Commonly known as: DEMADEX Take 60-80 mg by mouth 2 (two) times daily. Take 4 tablets (80 mg) in the morning and Take 3 tablets (60 mg) at noon   Toujeo SoloStar 300 UNIT/ML Sopn Generic drug: Insulin Glargine (1 Unit Dial) Inject 11 Units into the skin every evening.   valACYclovir 1000 MG tablet Commonly known as: VALTREX Take 1,000 mg by mouth daily.   vitamin B-12 500 MCG tablet Commonly known as: CYANOCOBALAMIN Take 500 mcg by mouth every morning.        Brief H and P: For complete details please refer to admission H and P, but in brief HPI per Dr Jonelle Sidle  Patient is a 78 year old male with ischemic cardiomyopathy with severe  systolic dysfunction, diabetes, hypertension, hyperlipidemia, history of coronary artery bypass grafting, history of DVT, chronic kidney disease stage III, history of carotid stenosis, polyneuropathy, paroxysmal atrial fibrillation, history of GI bleed with symptomatic anemia who is currently on hospice care for palliative therapy brought in secondary to left lower extremity swelling redness and pain. Symptoms have been going on for about 6 days. He has had falls and weakness. It started as a red spot like a dollar bill size and progressed so far. It is now covering his left lower extremity all the way to his thigh. No itching. Is more warmth. The right side is not affected. Today he was transferring from chair to wheelchair when he could not stand and he fell. Patient came in evaluated and found to have cellulitis.  Urinalysis also indicated possible UTI he is therefore being admitted for further work-up. He denied any fever or chills no nausea vomiting or diarrhea.Marland Kitchen  COVID negative   Hospital Course:   Left leg cellulitis -Left leg cellulitis much improved. -Patient on day #5 of IV vancomycin and meropenem. -will transition to oral doxycycline at discharge for the left leg cellulitis -Continue to elevate leg  Active Problems:   Diabetes mellitus type 2 with complications (HCC) -Hemoglobin A1c 7.5 - continue home insulin regimen  History of CAD S/P CABG (coronary artery bypass graft), ischemic cardiomyopathy, chronic systolic CHF, EF 22-63% -Currently no complaints of chest pain or shortness of breath, compensated, euvolemic -Continue atorvastatin, Coreg, Imdur -Continue torsemide, potassium  Paroxysmal atrial fibrillation -Currently normal sinus rhythm, continue amiodarone - not on AC due to prior history of GI bleed   Klebsiella UTI, ESBL - Urine culture shows 90,000 colonies of Klebsiella pneumonia, ESBL positive -Continue IV meropenem, day #5 today. Blood cultures negative.   -Discussed with ID, Dr. Johnnye Sima, recommended IV meropenem for total 5 days.  Patient has no symptoms of acute cystitis or UTI.  DC IV meropenem after today's dose.  Essential hypertension -BP stable, cont Coreg, imdur  CKD stage III -Creatinine stable 1.7 into baseline  Hypothyroidism -Continue Synthroid  Generalized debility -PT evaluation recommended continue home with hospice    Day of Discharge S: Wants to go home, no fevers or chills, left lower leg cellulitis improving  BP (!) 156/64 (BP Location: Right Arm)   Pulse (!) 51   Temp 98.5 F (36.9 C) (Oral)   Resp 14   Ht 5\' 10"  (1.778 m)   Wt 104.1 kg   SpO2 98%   BMI 32.93 kg/m   Physical Exam: General: Alert and awake oriented x3 not in any acute distress. HEENT: anicteric sclera, pupils reactive to light and  accommodation CVS: S1-S2 clear no murmur rubs or gallops Chest: clear to auscultation bilaterally, no wheezing rales or rhonchi Abdomen: soft nontender, nondistended, normal bowel sounds Extremities: Left lower leg cellulitis improving, appears to have chronic discoloration from PVD Neuro: Cranial nerves II-XII intact, no focal neurological deficits   The results of significant diagnostics from this hospitalization (including imaging, microbiology, ancillary and laboratory) are listed below for reference.      Procedures/Studies:  Dg Chest Port 1 View  Result Date: 12/21/2018 CLINICAL DATA:  Fever. Chills. Recent falls. EXAM: PORTABLE CHEST 1 VIEW COMPARISON:  Radiograph April 17, 2018 FINDINGS: Post median sternotomy.The cardiomediastinal contours are unchanged with mild cardiomegaly and aortic atherosclerosis. Vascular congestion without overt pulmonary edema. No consolidation, pleural effusion, or pneumothorax. No acute osseous abnormalities are seen. IMPRESSION: Cardiomegaly with vascular congestion. Electronically Signed   By:  Keith Rake M.D.   On: 12/21/2018 18:08      LAB RESULTS: Basic Metabolic Panel: Recent Labs  Lab 12/25/18 0415 12/26/18 0312  NA 136 138  K 3.5 3.4*  CL 97* 98  CO2 28 27  GLUCOSE 129* 98  BUN 63* 61*  CREATININE 1.76* 1.74*  CALCIUM 8.5* 8.5*   Liver Function Tests: Recent Labs  Lab 12/21/18 1654 12/22/18 0317  AST 42* 41  ALT 36 35  ALKPHOS 58 55  BILITOT 1.0 0.9  PROT 6.2* 6.4*  ALBUMIN 2.7* 2.7*   No results for input(s): LIPASE, AMYLASE in the last 168 hours. No results for input(s): AMMONIA in the last 168 hours. CBC: Recent Labs  Lab 12/21/18 1654  12/25/18 0415 12/26/18 0312  WBC 19.5*   < > 6.0 7.9  NEUTROABS 18.1*  --   --   --   HGB 12.7*   < > 12.5* 11.9*  HCT 37.8*   < > 37.9* 36.8*  MCV 101.9*   < > 102.4* 103.1*  PLT 150   < > 207 217   < > = values in this interval not displayed.   Cardiac  Enzymes: No results for input(s): CKTOTAL, CKMB, CKMBINDEX, TROPONINI in the last 168 hours. BNP: Invalid input(s): POCBNP CBG: Recent Labs  Lab 12/26/18 0744 12/26/18 1224  GLUCAP 78 143*      Disposition and Follow-up: Discharge Instructions    Diet - low sodium heart healthy   Complete by: As directed    Increase activity slowly   Complete by: As directed        DISPOSITION: Home with hospice   DISCHARGE FOLLOW-UP Follow-up Information    Lavone Orn, MD Follow up in 2 week(s).   Specialty: Internal Medicine Contact information: [address] [place] [address] [phone]        Belva Crome, MD .   Specialty: Cardiology Contact information: [phone] [address] [place] [address] [phone]            Time coordinating discharge:  49mins   Signed:   Estill Cotta M.D. [facility] 12/26/2018, 12:47 PM

## 2018-12-26 NOTE — Progress Notes (Signed)
Physical Therapy Treatment Patient Details Name: [name] MRN: [id number] DOB: 03/30/1941 Today's Date: 12/26/2018    History of Present Illness 78 yo male admitted with L LE cellulitis. Hx of DM, CABG, DVT, PVD, CHF, CKD, polyneuropathy, L drop foot, L fem-popliteal bypass, A fib, anemia    PT Comments    Patient progressing gradually with therapy. Patient requiring min assist for sit<>stand transfers this session compared to mod assist prior session. He continues to require cues throughout gait to maintain safe proximity with RW and improve posture as pt has tendency to flex forward when fatigued. Patient educated on sit to stand repetitions for LE strengthening and progress to single UE assist to initiate lift. He will continue to benefit from skilled PT interventions throughout Acute stay.   Follow Up Recommendations  Supervision/Assistance - 24 hour     Equipment Recommendations  None recommended by PT    Recommendations for Other Services       Precautions / Restrictions Precautions Precautions: Fall Precaution Comments: L drop foot Restrictions Weight Bearing Restrictions: No    Mobility  Bed Mobility          General bed mobility comments: pt in bedside recliner at start of session  Transfers Overall transfer level: Needs assistance Equipment used: Rolling walker (2 wheeled) Transfers: Sit to/from Stand Sit to Stand: Min assist         General transfer comment: cues for safe hand placement on RW and armrests  at start, patient requireing min assist to initiate rise. Improved carryovet for sfe hand placement to reach back and control decent to sit.  Ambulation/Gait Ambulation/Gait assistance: Min assist Gait Distance (Feet): 100 Feet Assistive device: Rolling walker (2 wheeled) Gait Pattern/deviations: Step-through pattern;Decreased stride length;Steppage;Decreased dorsiflexion - left Gait velocity: slow   General Gait Details: cues for safety to  maintain safe proximety to RW, 3 standing rest breaks throughuot session and pt on 2L/min O2 as he reports he has oxygen at home   Stairs             Wheelchair Mobility    Modified Rankin (Stroke Patients Only)       Balance Overall balance assessment: History of Falls;Needs assistance         Standing balance support: Bilateral upper extremity supported Standing balance-Leahy Scale: Poor                 Cognition Arousal/Alertness: Awake/alert Behavior During Therapy: WFL for tasks assessed/performed Overall Cognitive Status: Within Functional Limits for tasks assessed        Exercises Other Exercises Other Exercises: 5 x sit to stand performed with bil UE use and single UE use on last 2 reps: from bedside recliner to RW    General Comments        Pertinent Vitals/Pain Pain Assessment: Faces Faces Pain Scale: Hurts a little bit Pain Location: L LE Pain Descriptors / Indicators: Discomfort;Sore Pain Intervention(s): Limited activity within patient's tolerance;Monitored during session           PT Goals (current goals can now be found in the care plan section) Acute Rehab PT Goals Patient Stated Goal: none stated PT Goal Formulation: With patient Time For Goal Achievement: 01/07/19 Potential to Achieve Goals: Good Progress towards PT goals: Progressing toward goals    Frequency    Min 3X/week      PT Plan Current plan remains appropriate       AM-PAC PT "6 Clicks" Mobility   Outcome Measure  Help needed turning from your back to your side while in a flat bed without using bedrails?: A Little Help needed moving from lying on your back to sitting on the side of a flat bed without using bedrails?: A Lot Help needed moving to and from a bed to a chair (including a wheelchair)?: A Little Help needed standing up from a chair using your arms (e.g., wheelchair or bedside chair)?: A Little Help needed to walk in hospital room?: A  Little Help needed climbing 3-5 steps with a railing? : A Lot 6 Click Score: 16    End of Session Equipment Utilized During Treatment: Gait belt;Oxygen Activity Tolerance: Patient tolerated treatment well Patient left: in chair;with call bell/phone within reach Nurse Communication: Mobility status PT Visit Diagnosis: History of falling (Z91.81);Muscle weakness (generalized) (M62.81);Difficulty in walking, not elsewhere classified (R26.2)     Time: 5797-2820 PT Time Calculation (min) (ACUTE ONLY): 36 min  Charges:  $Gait Training: 8-22 mins $Therapeutic Exercise: 8-22 mins                     Kipp Brood, PT, DPT, [facility] Physical Therapist with [facility]  12/26/2018 1:55 PM

## 2018-12-26 NOTE — Progress Notes (Signed)
Patient is active with [facility].  Patient discharging home: [facility] non-emergency to be arranged for transport.   Kathrin Greathouse, Marlinda Mike, MSW Clinical Social Worker  [phone] 12/26/2018  11:41 AM

## 2019-01-02 DIAGNOSIS — H04123 Dry eye syndrome of bilateral lacrimal glands: Secondary | ICD-10-CM | POA: Diagnosis not present

## 2019-01-02 DIAGNOSIS — E119 Type 2 diabetes mellitus without complications: Secondary | ICD-10-CM | POA: Diagnosis not present

## 2019-01-02 DIAGNOSIS — L03116 Cellulitis of left lower limb: Secondary | ICD-10-CM | POA: Diagnosis not present

## 2019-01-02 DIAGNOSIS — H43813 Vitreous degeneration, bilateral: Secondary | ICD-10-CM | POA: Diagnosis not present

## 2019-01-02 DIAGNOSIS — H524 Presbyopia: Secondary | ICD-10-CM | POA: Diagnosis not present

## 2019-01-16 DIAGNOSIS — I2510 Atherosclerotic heart disease of native coronary artery without angina pectoris: Secondary | ICD-10-CM | POA: Insufficient documentation

## 2019-01-16 NOTE — Progress Notes (Signed)
Cardiology Office Note    Date:  01/17/2019   ID:  Carlton, Buskey [birth year], MRN [id number]  PCP:  Lavone Orn, MD  Cardiologist: Sinclair Grooms, MD EPS: None  Chief Complaint  Patient presents with  . Follow-up    History of Present Illness:  [name] is a 78 y.o. male with history of ischemic cardiomyopathy EF 30 to 35% on TEE, 25 to 30% on TTE,CKD stage III-IV, atrial fibrillation status post DCCV 03/2018 on amiodarone and Eliquis, CAD status post CABG in 2013, severe PAD status post aortobifemoral bypass, left femoral endarterectomy with femoropopliteal bypass, bilateral carotid artery disease status post endarterectomy, hypertension, HLD hospice for debilitated state.  Patient was discharged from the hospital 12/26/2018 after hospitalization for left leg cellulitis Klebsiella UTI.  Patient walks into the office today with a walker. Is down almost 30 lbs since I last saw him in a wheelchair. Now feeling much better. No swelling. Has chronic orthopnea on O2. Not using zaroxolyn. Complains of foot neuropathy.  Wants to go to the [facility] to use a machine in their gym.  Past Medical History:  Diagnosis Date  . Arthritis   . Atherosclerotic heart disease   . Back pain   . Bruises easily    takes Pletal daily and ASA 325mg  daily  . Cataract immature    right  . Chest pain   . Colon polyps   . Complication of anesthesia    hard to wake up with bypass surgery  . Cyst    on perineum;takes Doxycycline daily  . Diabetes mellitus    takes Glipizide,Metformin,and Actos daily  . Diarrhea   . Dyspnea    when lying flat  . Edema    right leg knee down swollen since fall 3 wks ago  . Foot drop, left   . GERD (gastroesophageal reflux disease)    Rolaids as needed-occ reflux  . Hemorrhoids   . History of kidney stones    at age 33   . Hyperlipidemia    takes Tricor and Lipitor daily  . Hypertension    takes Altace,Amlodipine,and Nadolol daily  .  Hypothyroidism   . Kidney stone    35+yrs ago  . Muscle pain   . Nasal polyps    hx of  . Peripheral edema    wears knee length hose-told by podiatrist to wear these  . Peripheral neuropathy    takes Gabapentin daily  . Pneumonia    as a child  . PVD (peripheral vascular disease) ([place])   . Renal insufficiency   . Seasonal allergies    takes Mucinex and Zyrtec prn  . Urinary frequency    urgency/takes Vesicare daily    Past Surgical History:  Procedure Laterality Date  . Aortobifemoral bypass    . APPLICATION OF WOUND VAC Left 10/25/2017   Procedure: Wound vac placement to left groin.;  Surgeon: Elam Dutch, MD;  Location: [facility] OR;  Service: Vascular;  Laterality: Left;  . APPLICATION OF WOUND VAC Left 11/10/2017   Procedure: APPLICATION OF WOUND VAC;  Surgeon: Elam Dutch, MD;  Location: [place];  Service: Vascular;  Laterality: Left;  . CARDIAC CATHETERIZATION  most recent 05/2011   total of 4  . CARDIOVERSION N/A 03/22/2018   Procedure: CARDIOVERSION;  Surgeon: Jerline Pain, MD;  Location: [facility] ENDOSCOPY;  Service: Cardiovascular;  Laterality: N/A;  . CAROTID ANGIOGRAM N/A 06/05/2011   Procedure: CAROTID ANGIOGRAM;  Surgeon: Jessy Oto  Fields, MD;  Location: [place] CATH LAB;  Service: Cardiovascular;  Laterality: N/A;  . CAROTID ENDARTERECTOMY  04/08/2011   left CEA  . cataract surgery Bilateral    left  . COLONOSCOPY    . COLONOSCOPY WITH PROPOFOL N/A 08/22/2013   Procedure: COLONOSCOPY WITH PROPOFOL;  Surgeon: Garlan Fair, MD;  Location: [facility] ENDOSCOPY;  Service: Endoscopy;  Laterality: N/A;  . CORONARY ARTERY BYPASS GRAFT  09/09/2011   Procedure: CORONARY ARTERY BYPASS GRAFTING (CABG);  Surgeon: Gaye Pollack, MD;  Location: [place];  Service: Open Heart Surgery;  Laterality: N/A;  Coronary artery bypass grafting  x three with Right saphenous vein harvested endoscopically and left internal mammary artery  . ENDARTERECTOMY  04/08/2011   Procedure: ENDARTERECTOMY CAROTID;   Surgeon: Elam Dutch, MD;  Location: [facility] OR;  Service: Vascular;  Laterality: Left;  with patch angioplasty  . ENDARTERECTOMY  09/09/2011   Procedure: ENDARTERECTOMY CAROTID;  Surgeon: Elam Dutch, MD;  Location: [facility] OR;  Service: Vascular;  Laterality: Right;  with patch angioplasty  . ESOPHAGOGASTRODUODENOSCOPY (EGD) WITH PROPOFOL N/A 03/10/2018   Procedure: ESOPHAGOGASTRODUODENOSCOPY (EGD) WITH PROPOFOL;  Surgeon: Arta Silence, MD;  Location: [place];  Service: Endoscopy;  Laterality: N/A;  . FEMORAL-POPLITEAL BYPASS GRAFT Left 10/25/2017   Procedure: Left Common Femoral Endarterectomy and perfundaplasty, Left BYPASS GRAFT FEMORAL-BELOW KNEE POPLITEAL ARTERY.;  Surgeon: Elam Dutch, MD;  Location: [place];  Service: Vascular;  Laterality: Left;  . GROIN DEBRIDEMENT Left 11/10/2017   Procedure: GROIN DEBRIDEMENT;  Surgeon: Elam Dutch, MD;  Location: [place];  Service: Vascular;  Laterality: Left;  . LEFT HEART CATHETERIZATION WITH CORONARY ANGIOGRAM N/A 07/09/2011   Procedure: LEFT HEART CATHETERIZATION WITH CORONARY ANGIOGRAM;  Surgeon: Sinclair Grooms, MD;  Location[facility] CATH LAB;  Service: Cardiovascular;  Laterality: N/A;  . LOWER EXTREMITY ANGIOGRAPHY N/A 10/11/2017   Procedure: LOWER EXTREMITY ANGIOGRAPHY;  Surgeon: Waynetta Sandy, MD;  Location: [place] CV LAB;  Service: Cardiovascular;  Laterality: N/A;  . Reimplantation of inferior mesenteric artery    . Repair of infrarenal abdominal aortic aneurysm    . RIGHT HEART CATH N/A 04/04/2018   Procedure: RIGHT HEART CATH;  Surgeon: Larey Dresser, MD;  Location: [place] CV LAB;  Service: Cardiovascular;  Laterality: N/A;  . SHOULDER ARTHROSCOPY W/ ROTATOR CUFF REPAIR     right and left-open procedures  . TEE WITHOUT CARDIOVERSION N/A 03/22/2018   Procedure: TRANSESOPHAGEAL ECHOCARDIOGRAM (TEE);  Surgeon: Jerline Pain, MD;  Location: [facility] ENDOSCOPY;  Service: Cardiovascular;  Laterality: N/A;  .  TONSILLECTOMY     at age 61  . TRIGGER FINGER RELEASE Right 06/28/2014   Procedure: RIGHT LONG TRIGGER RELEASE ;  Surgeon: Leanora Cover, MD;  Location: [place];  Service: Orthopedics;  Laterality: Right;  . wisdom teeth extracted     as a teenager  . WOUND DEBRIDEMENT Left 11/05/2017   Procedure: DEBRIDEMENT WOUND LEFT GROIN with wound vac placement;  Surgeon: Elam Dutch, MD;  Location: [facility] OR;  Service: Vascular;  Laterality: Left;    Current Medications: Current Meds  Medication Sig  . acetaminophen (TYLENOL) 500 MG tablet Take 1,000 mg by mouth every 6 (six) hours as needed for fever or headache (pain).   Marland Kitchen amiodarone (PACERONE) 200 MG tablet Take 1 tablet (200 mg total) by mouth every evening.  Marland Kitchen atorvastatin (LIPITOR) 20 MG tablet Take 20 mg by mouth daily after supper.   . carvedilol (COREG) 3.125 MG  tablet Take 1 tablet (3.125 mg total) by mouth 2 (two) times daily.  . ferrous sulfate 325 (65 FE) MG tablet Take 1 tablet (325 mg total) by mouth 2 (two) times daily with a meal.  . hydrALAZINE (APRESOLINE) 25 MG tablet Take 1 tablet (25 mg total) by mouth every 8 (eight) hours.  Marland Kitchen HYDROcodone-acetaminophen (NORCO/VICODIN) 5-325 MG tablet Take 1 tablet by mouth every 6 (six) hours as needed for moderate pain or severe pain.  . isosorbide mononitrate (IMDUR) 30 MG 24 hr tablet Take 1 tablet (30 mg total) by mouth daily.  Marland Kitchen levothyroxine (SYNTHROID) 112 MCG tablet Take 112 mcg by mouth daily before breakfast.   . LORazepam (ATIVAN) 0.5 MG tablet Take 0.5 mg by mouth at bedtime as needed for anxiety.  . methylcellulose (ARTIFICIAL TEARS) 1 % ophthalmic solution Place 1 drop into both eyes 2 (two) times daily as needed (dry eyes).  . metolazone (ZAROXOLYN) 5 MG tablet Take 5 mg by mouth daily as needed (edema).   . Multiple Vitamin (MULTIVITAMIN WITH MINERALS) TABS tablet Take 1 tablet by mouth daily.  Marland Kitchen MYRBETRIQ 25 MG TB24 tablet Take 25 mg by mouth daily.   .  nitroGLYCERIN (NITROSTAT) 0.4 MG SL tablet PLACE 1 TABLET UNDER TONGUE EVERY 5 MINUTES AS NEEDED FOR CHEST PAIN.  Marland Kitchen nystatin (MYCOSTATIN/NYSTOP) powder Apply topically 3 (three) times daily. APPLY TO SKIN FOLDS  . pantoprazole (PROTONIX) 40 MG tablet Take 1 tablet (40 mg total) by mouth daily.  . polyethylene glycol powder (GLYCOLAX/MIRALAX) 17 GM/SCOOP powder Take 0.5 Containers by mouth daily as needed for mild constipation.   . Potassium Chloride ER 20 MEQ TBCR Take 1 tablet by mouth 2 (two) times a day.   . pseudoephedrine-guaifenesin (MUCINEX D) 60-600 MG per tablet Take 1 tablet by mouth every 12 (twelve) hours as needed for congestion.   . SENNA S 8.6-50 MG tablet Take 1 tablet by mouth 2 (two) times daily as needed for mild constipation.   . tamsulosin (FLOMAX) 0.4 MG CAPS capsule Take 0.4 mg by mouth every Monday, Wednesday, and Friday.   . temazepam (RESTORIL) 15 MG capsule Take 15 mg by mouth at bedtime as needed for sleep.   Marland Kitchen torsemide (DEMADEX) 20 MG tablet Take 60-80 mg by mouth 2 (two) times daily. Take 4 tablets (80 mg) in the morning and Take 3 tablets (60 mg) at noon  . TOUJEO SOLOSTAR 300 UNIT/ML SOPN Inject 11 Units into the skin every evening.   . valACYclovir (VALTREX) 1000 MG tablet Take 1,000 mg by mouth daily.  . vitamin B-12 (CYANOCOBALAMIN) 500 MCG tablet Take 500 mcg by mouth every morning.      Allergies:   Amoxicillin-pot clavulanate, Morphine and related, Percocet [oxycodone-acetaminophen], and Restoril [temazepam]   Social History   Socioeconomic History  . Marital status: Married    Spouse name: Not on file  . Number of children: 2  . Years of education: Not on file  . Highest education level: Professional school degree (e.g., MD, DDS, DVM, JD)  Occupational History  . Occupation: retired Chief Executive Officer  Social Needs  . Financial resource strain: Not on file  . Food insecurity    Worry: Not on file    Inability: Not on file  . Transportation needs     Medical: Not on file    Non-medical: Not on file  Tobacco Use  . Smoking status: Former Smoker    Years: 40.00    Types: Cigarettes    Quit date:  10/27/2005    Years since quitting: 13.2  . Smokeless tobacco: Never Used  Substance and Sexual Activity  . Alcohol use: Yes    Alcohol/week: 2.0 - 3.0 standard drinks    Types: 2 - 3 Cans of beer per week    Comment: occasional- weekly  . Drug use: No  . Sexual activity: Not Currently  Lifestyle  . Physical activity    Days per week: Not on file    Minutes per session: Not on file  . Stress: Not on file  Relationships  . Social Herbalist on phone: Patient refused    Gets together: Patient refused    Attends religious service: Patient refused    Active member of club or organization: Patient refused    Attends meetings of clubs or organizations: Patient refused    Relationship status: Patient refused  Other Topics Concern  . Not on file  Social History Narrative   He lives with wife in a 1 story home, three steps to entire home.  2 children.  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       Family History:  The patient's   family history includes Heart disease in his father and mother.   ROS:   Please see the history of present illness.    ROS All other systems reviewed and are negative.   PHYSICAL EXAM:   VS:  BP 120/60   Pulse 66   Ht 5\' 10"  (1.778 m)   Wt 233 lb (105.7 kg)   SpO2 92%   BMI 33.43 kg/m   Physical Exam  GEN: Obese, in no acute distress  Neck: no JVD, carotid bruits, or masses Cardiac:RRR; no murmurs, rubs, or gallops  Respiratory: Decreased breath sounds at bases but clear to auscultation bilaterally, normal work of breathing GI: soft, nontender, nondistended, + BS Ext: without cyanosis, clubbing, or edema, Good distal pulses bilaterally Neuro:  Alert and Oriented x 3  Psych: euthymic mood, full affect  Wt Readings from Last 3 Encounters:  01/17/19 233 lb (105.7 kg)  12/22/18 229 lb 8 oz  (104.1 kg)  08/01/18 240 lb 2.1 oz (108.9 kg)      Studies/Labs Reviewed:   EKG:  EKG is not ordered today.   Recent Labs: 02/24/2018: NT-Pro BNP 17,251 03/18/2018: TSH 3.150 04/01/2018: Magnesium 2.1 04/17/2018: B Natriuretic Peptide 2,522.7 12/22/2018: ALT 35 12/26/2018: BUN 61; Creatinine, Ser 1.74; Hemoglobin 11.9; Platelets 217; Potassium 3.4; Sodium 138   Lipid Panel No results found for: CHOL, TRIG, HDL, CHOLHDL, VLDL, LDLCALC, LDLDIRECT  Additional studies/ records that were reviewed today include:  Echo TEE 03/22/18 Study Conclusions   - Left ventricle: The cavity size was dilated. Wall thickness was   normal. Systolic function was moderately to severely reduced. The   estimated ejection fraction was in the range of 30% to 35%.   Diffuse hypokinesis. No evidence of thrombus. - Aortic valve: No evidence of vegetation. - Mitral valve: Mildly calcified annulus. Mildly thickened leaflets   . No evidence of vegetation. There was mild regurgitation. - Left atrium: The atrium was dilated. No evidence of thrombus in   the atrial cavity or appendage. No evidence of thrombus in the   atrial cavity or appendage. No evidence of thrombus in the   appendage. - Right ventricle: The cavity size was dilated. Systolic function   was moderately reduced. - Right atrium: The atrium was dilated. No evidence of thrombus in   the atrial cavity or appendage. -  Atrial septum: No defect or patent foramen ovale was identified.   Echo contrast study showed no right-to-left atrial level shunt,   following an increase in RA pressure induced by provocative   maneuvers. - Tricuspid valve: No evidence of vegetation. - Pulmonic valve: No evidence of vegetation. - Superior vena cava: The study excluded a thrombus.   Impressions:   - Successful cardioversion. No cardiac source of emboli was   indentified.   2D echo 9/2019Study Conclusions   - Left ventricle: The cavity size was normal. There was  moderate   concentric hypertrophy. Systolic function was mildly to   moderately reduced. The estimated ejection fraction was in the   range of 40% to 45%. Anterior hypokinesis. Grade 2 DD with high   LV filling pressure. - Aortic valve: Calcified without stenosis. Mean gradient (S): 6 mm   Hg. - Left atrium: Moderately dilated. - Right atrium: The atrium was mildly dilated. - Inferior vena cava: The vessel was normal in size. The   respirophasic diameter changes were in the normal range (>= 50%),   consistent with normal central venous pressure.   Impressions:   - Technically difficult study. LVEF 40-45%, more significant   anterior hypokinesis, grade 2 DD, high LV filling pressure,   moderate LAE, mild RAE.      ASSESSMENT:    1. Chronic combined systolic and diastolic heart failure ([place])   2. Coronary artery disease involving native coronary artery of native heart without angina pectoris   3. Essential hypertension   4. Paroxysmal atrial fibrillation (HCC)   5. PAD (peripheral artery disease) ([place])   6. Chronic kidney disease, stage III (moderate) (HCC)      PLAN:  In order of problems listed above:  Chronic combined systolic and diastolic CHF EF 15-94% TEE 02/2018, 25-30% TTE -well compensated today.  No edema.  Chronic dyspnea and orthopnea on home O2.  This is the best that I have seen him in a long time.  Follow-up with Dr. Tamala Julian in 3 months.  Afib S/P DCCV 02/2018 on Amio and Eliquis heart rate regular.  Had labs while in the hospital at the end of July and August that are all stable.  No need to repeat.  Last TSH was 07/2017.  Will need a TSH in March.  CAD S/P CABG 2013 no angina  Severe PAD   Debilitated state on hospice  Essential HTN blood pressure well controlled  CKD Crt 1.74 12/26/18   Medication Adjustments/Labs and Tests Ordered: Current medicines are reviewed at length with the patient today.  Concerns regarding medicines are outlined above.   Medication changes, Labs and Tests ordered today are listed in the Patient Instructions below. There are no Patient Instructions on file for this visit.   Signed, Ermalinda Barrios, PA-C  01/17/2019 1:39 PM    [facility] [place], [place], [place]  [address] Phone: [phone]; Fax: [phone]

## 2019-01-17 ENCOUNTER — Encounter: Payer: Self-pay | Admitting: Physician Assistant

## 2019-01-17 ENCOUNTER — Other Ambulatory Visit: Payer: Self-pay

## 2019-01-17 ENCOUNTER — Ambulatory Visit (INDEPENDENT_AMBULATORY_CARE_PROVIDER_SITE_OTHER): Payer: Medicare Other | Admitting: Physician Assistant

## 2019-01-17 VITALS — BP 120/60 | HR 66 | Ht 70.0 in | Wt 233.0 lb

## 2019-01-17 DIAGNOSIS — I10 Essential (primary) hypertension: Secondary | ICD-10-CM | POA: Diagnosis not present

## 2019-01-17 DIAGNOSIS — I480 Paroxysmal atrial fibrillation: Secondary | ICD-10-CM

## 2019-01-17 DIAGNOSIS — N183 Chronic kidney disease, stage 3 unspecified: Secondary | ICD-10-CM

## 2019-01-17 DIAGNOSIS — I739 Peripheral vascular disease, unspecified: Secondary | ICD-10-CM | POA: Diagnosis not present

## 2019-01-17 DIAGNOSIS — I2510 Atherosclerotic heart disease of native coronary artery without angina pectoris: Secondary | ICD-10-CM

## 2019-01-17 DIAGNOSIS — I5042 Chronic combined systolic (congestive) and diastolic (congestive) heart failure: Secondary | ICD-10-CM | POA: Diagnosis not present

## 2019-01-17 NOTE — Patient Instructions (Signed)
Medication Instructions:  Your physician recommends that you continue on your current medications as directed. Please refer to the Current Medication list given to you today.  If you need a refill on your cardiac medications before your next appointment, please call your pharmacy.   Lab work: None ordered  If you have labs (blood work) drawn today and your tests are completely normal, you will receive your results only by: Marland Kitchen MyChart Message (if you have MyChart) OR . A paper copy in the mail If you have any lab test that is abnormal or we need to change your treatment, we will call you to review the results.  Testing/Procedures: None ordered  Follow-Up: At [facility], you and your health needs are our priority.  As part of our continuing mission to provide you with exceptional heart care, we have created designated Provider Care Teams.  These Care Teams include your primary Cardiologist (physician) and Advanced Practice Providers (APPs -  Physician Assistants and Nurse Practitioners) who all work together to provide you with the care you need, when you need it. You will need a follow up appointment in 3 months Sinclair Grooms, MD Any Other Special Instructions Will Be Listed Below (If Applicable).

## 2019-01-24 DIAGNOSIS — I255 Ischemic cardiomyopathy: Secondary | ICD-10-CM | POA: Diagnosis not present

## 2019-01-24 DIAGNOSIS — I480 Paroxysmal atrial fibrillation: Secondary | ICD-10-CM | POA: Diagnosis not present

## 2019-01-24 DIAGNOSIS — E1122 Type 2 diabetes mellitus with diabetic chronic kidney disease: Secondary | ICD-10-CM | POA: Diagnosis not present

## 2019-01-24 DIAGNOSIS — I2510 Atherosclerotic heart disease of native coronary artery without angina pectoris: Secondary | ICD-10-CM | POA: Diagnosis not present

## 2019-01-24 DIAGNOSIS — R0601 Orthopnea: Secondary | ICD-10-CM | POA: Diagnosis not present

## 2019-01-24 DIAGNOSIS — I5020 Unspecified systolic (congestive) heart failure: Secondary | ICD-10-CM | POA: Diagnosis not present

## 2019-01-24 DIAGNOSIS — N184 Chronic kidney disease, stage 4 (severe): Secondary | ICD-10-CM | POA: Diagnosis not present

## 2019-01-24 DIAGNOSIS — E1151 Type 2 diabetes mellitus with diabetic peripheral angiopathy without gangrene: Secondary | ICD-10-CM | POA: Diagnosis not present

## 2019-01-24 DIAGNOSIS — E785 Hyperlipidemia, unspecified: Secondary | ICD-10-CM | POA: Diagnosis not present

## 2019-01-24 DIAGNOSIS — Z9981 Dependence on supplemental oxygen: Secondary | ICD-10-CM | POA: Diagnosis not present

## 2019-01-24 DIAGNOSIS — I130 Hypertensive heart and chronic kidney disease with heart failure and stage 1 through stage 4 chronic kidney disease, or unspecified chronic kidney disease: Secondary | ICD-10-CM | POA: Diagnosis not present

## 2019-01-24 DIAGNOSIS — I70209 Unspecified atherosclerosis of native arteries of extremities, unspecified extremity: Secondary | ICD-10-CM | POA: Diagnosis not present

## 2019-01-24 DIAGNOSIS — K922 Gastrointestinal hemorrhage, unspecified: Secondary | ICD-10-CM | POA: Diagnosis not present

## 2019-01-26 DIAGNOSIS — E1122 Type 2 diabetes mellitus with diabetic chronic kidney disease: Secondary | ICD-10-CM | POA: Diagnosis not present

## 2019-01-26 DIAGNOSIS — I130 Hypertensive heart and chronic kidney disease with heart failure and stage 1 through stage 4 chronic kidney disease, or unspecified chronic kidney disease: Secondary | ICD-10-CM | POA: Diagnosis not present

## 2019-01-26 DIAGNOSIS — N184 Chronic kidney disease, stage 4 (severe): Secondary | ICD-10-CM | POA: Diagnosis not present

## 2019-01-26 DIAGNOSIS — I5020 Unspecified systolic (congestive) heart failure: Secondary | ICD-10-CM | POA: Diagnosis not present

## 2019-01-26 DIAGNOSIS — I2510 Atherosclerotic heart disease of native coronary artery without angina pectoris: Secondary | ICD-10-CM | POA: Diagnosis not present

## 2019-01-26 DIAGNOSIS — I255 Ischemic cardiomyopathy: Secondary | ICD-10-CM | POA: Diagnosis not present

## 2019-02-02 DIAGNOSIS — Z794 Long term (current) use of insulin: Secondary | ICD-10-CM | POA: Diagnosis not present

## 2019-02-02 DIAGNOSIS — F5104 Psychophysiologic insomnia: Secondary | ICD-10-CM | POA: Diagnosis not present

## 2019-02-02 DIAGNOSIS — E1151 Type 2 diabetes mellitus with diabetic peripheral angiopathy without gangrene: Secondary | ICD-10-CM | POA: Diagnosis not present

## 2019-02-02 DIAGNOSIS — E1122 Type 2 diabetes mellitus with diabetic chronic kidney disease: Secondary | ICD-10-CM | POA: Diagnosis not present

## 2019-02-02 DIAGNOSIS — I5020 Unspecified systolic (congestive) heart failure: Secondary | ICD-10-CM | POA: Diagnosis not present

## 2019-02-02 DIAGNOSIS — I255 Ischemic cardiomyopathy: Secondary | ICD-10-CM | POA: Diagnosis not present

## 2019-02-02 DIAGNOSIS — I130 Hypertensive heart and chronic kidney disease with heart failure and stage 1 through stage 4 chronic kidney disease, or unspecified chronic kidney disease: Secondary | ICD-10-CM | POA: Diagnosis not present

## 2019-02-02 DIAGNOSIS — N184 Chronic kidney disease, stage 4 (severe): Secondary | ICD-10-CM | POA: Diagnosis not present

## 2019-02-02 DIAGNOSIS — I2510 Atherosclerotic heart disease of native coronary artery without angina pectoris: Secondary | ICD-10-CM | POA: Diagnosis not present

## 2019-02-02 DIAGNOSIS — I129 Hypertensive chronic kidney disease with stage 1 through stage 4 chronic kidney disease, or unspecified chronic kidney disease: Secondary | ICD-10-CM | POA: Diagnosis not present

## 2019-02-02 DIAGNOSIS — I5040 Unspecified combined systolic (congestive) and diastolic (congestive) heart failure: Secondary | ICD-10-CM | POA: Diagnosis not present

## 2019-02-02 DIAGNOSIS — E1142 Type 2 diabetes mellitus with diabetic polyneuropathy: Secondary | ICD-10-CM | POA: Diagnosis not present

## 2019-02-02 DIAGNOSIS — K219 Gastro-esophageal reflux disease without esophagitis: Secondary | ICD-10-CM | POA: Diagnosis not present

## 2019-02-09 DIAGNOSIS — I255 Ischemic cardiomyopathy: Secondary | ICD-10-CM | POA: Diagnosis not present

## 2019-02-09 DIAGNOSIS — N184 Chronic kidney disease, stage 4 (severe): Secondary | ICD-10-CM | POA: Diagnosis not present

## 2019-02-09 DIAGNOSIS — E1122 Type 2 diabetes mellitus with diabetic chronic kidney disease: Secondary | ICD-10-CM | POA: Diagnosis not present

## 2019-02-09 DIAGNOSIS — I130 Hypertensive heart and chronic kidney disease with heart failure and stage 1 through stage 4 chronic kidney disease, or unspecified chronic kidney disease: Secondary | ICD-10-CM | POA: Diagnosis not present

## 2019-02-09 DIAGNOSIS — I2510 Atherosclerotic heart disease of native coronary artery without angina pectoris: Secondary | ICD-10-CM | POA: Diagnosis not present

## 2019-02-09 DIAGNOSIS — I5020 Unspecified systolic (congestive) heart failure: Secondary | ICD-10-CM | POA: Diagnosis not present

## 2019-02-14 DIAGNOSIS — I2510 Atherosclerotic heart disease of native coronary artery without angina pectoris: Secondary | ICD-10-CM | POA: Diagnosis not present

## 2019-02-14 DIAGNOSIS — I5020 Unspecified systolic (congestive) heart failure: Secondary | ICD-10-CM | POA: Diagnosis not present

## 2019-02-14 DIAGNOSIS — I130 Hypertensive heart and chronic kidney disease with heart failure and stage 1 through stage 4 chronic kidney disease, or unspecified chronic kidney disease: Secondary | ICD-10-CM | POA: Diagnosis not present

## 2019-02-14 DIAGNOSIS — I255 Ischemic cardiomyopathy: Secondary | ICD-10-CM | POA: Diagnosis not present

## 2019-02-14 DIAGNOSIS — E1122 Type 2 diabetes mellitus with diabetic chronic kidney disease: Secondary | ICD-10-CM | POA: Diagnosis not present

## 2019-02-14 DIAGNOSIS — N184 Chronic kidney disease, stage 4 (severe): Secondary | ICD-10-CM | POA: Diagnosis not present

## 2019-02-21 DIAGNOSIS — E1122 Type 2 diabetes mellitus with diabetic chronic kidney disease: Secondary | ICD-10-CM | POA: Diagnosis not present

## 2019-02-21 DIAGNOSIS — I2510 Atherosclerotic heart disease of native coronary artery without angina pectoris: Secondary | ICD-10-CM | POA: Diagnosis not present

## 2019-02-21 DIAGNOSIS — I255 Ischemic cardiomyopathy: Secondary | ICD-10-CM | POA: Diagnosis not present

## 2019-02-21 DIAGNOSIS — I5020 Unspecified systolic (congestive) heart failure: Secondary | ICD-10-CM | POA: Diagnosis not present

## 2019-02-21 DIAGNOSIS — I130 Hypertensive heart and chronic kidney disease with heart failure and stage 1 through stage 4 chronic kidney disease, or unspecified chronic kidney disease: Secondary | ICD-10-CM | POA: Diagnosis not present

## 2019-02-21 DIAGNOSIS — N184 Chronic kidney disease, stage 4 (severe): Secondary | ICD-10-CM | POA: Diagnosis not present

## 2019-02-23 DIAGNOSIS — I130 Hypertensive heart and chronic kidney disease with heart failure and stage 1 through stage 4 chronic kidney disease, or unspecified chronic kidney disease: Secondary | ICD-10-CM | POA: Diagnosis not present

## 2019-02-23 DIAGNOSIS — K922 Gastrointestinal hemorrhage, unspecified: Secondary | ICD-10-CM | POA: Diagnosis not present

## 2019-02-23 DIAGNOSIS — E1151 Type 2 diabetes mellitus with diabetic peripheral angiopathy without gangrene: Secondary | ICD-10-CM | POA: Diagnosis not present

## 2019-02-23 DIAGNOSIS — Z9981 Dependence on supplemental oxygen: Secondary | ICD-10-CM | POA: Diagnosis not present

## 2019-02-23 DIAGNOSIS — E1122 Type 2 diabetes mellitus with diabetic chronic kidney disease: Secondary | ICD-10-CM | POA: Diagnosis not present

## 2019-02-23 DIAGNOSIS — R0601 Orthopnea: Secondary | ICD-10-CM | POA: Diagnosis not present

## 2019-02-23 DIAGNOSIS — I480 Paroxysmal atrial fibrillation: Secondary | ICD-10-CM | POA: Diagnosis not present

## 2019-02-23 DIAGNOSIS — I2510 Atherosclerotic heart disease of native coronary artery without angina pectoris: Secondary | ICD-10-CM | POA: Diagnosis not present

## 2019-02-23 DIAGNOSIS — I255 Ischemic cardiomyopathy: Secondary | ICD-10-CM | POA: Diagnosis not present

## 2019-02-23 DIAGNOSIS — N184 Chronic kidney disease, stage 4 (severe): Secondary | ICD-10-CM | POA: Diagnosis not present

## 2019-02-23 DIAGNOSIS — E785 Hyperlipidemia, unspecified: Secondary | ICD-10-CM | POA: Diagnosis not present

## 2019-02-23 DIAGNOSIS — I70209 Unspecified atherosclerosis of native arteries of extremities, unspecified extremity: Secondary | ICD-10-CM | POA: Diagnosis not present

## 2019-02-23 DIAGNOSIS — I5020 Unspecified systolic (congestive) heart failure: Secondary | ICD-10-CM | POA: Diagnosis not present

## 2019-02-27 DIAGNOSIS — I2510 Atherosclerotic heart disease of native coronary artery without angina pectoris: Secondary | ICD-10-CM | POA: Diagnosis not present

## 2019-02-27 DIAGNOSIS — N184 Chronic kidney disease, stage 4 (severe): Secondary | ICD-10-CM | POA: Diagnosis not present

## 2019-02-27 DIAGNOSIS — I130 Hypertensive heart and chronic kidney disease with heart failure and stage 1 through stage 4 chronic kidney disease, or unspecified chronic kidney disease: Secondary | ICD-10-CM | POA: Diagnosis not present

## 2019-02-27 DIAGNOSIS — I255 Ischemic cardiomyopathy: Secondary | ICD-10-CM | POA: Diagnosis not present

## 2019-02-27 DIAGNOSIS — E1122 Type 2 diabetes mellitus with diabetic chronic kidney disease: Secondary | ICD-10-CM | POA: Diagnosis not present

## 2019-02-27 DIAGNOSIS — I5020 Unspecified systolic (congestive) heart failure: Secondary | ICD-10-CM | POA: Diagnosis not present

## 2019-02-28 ENCOUNTER — Telehealth: Payer: Self-pay | Admitting: Interventional Cardiology

## 2019-02-28 DIAGNOSIS — E1122 Type 2 diabetes mellitus with diabetic chronic kidney disease: Secondary | ICD-10-CM | POA: Diagnosis not present

## 2019-02-28 DIAGNOSIS — I2510 Atherosclerotic heart disease of native coronary artery without angina pectoris: Secondary | ICD-10-CM | POA: Diagnosis not present

## 2019-02-28 DIAGNOSIS — I5020 Unspecified systolic (congestive) heart failure: Secondary | ICD-10-CM | POA: Diagnosis not present

## 2019-02-28 DIAGNOSIS — N184 Chronic kidney disease, stage 4 (severe): Secondary | ICD-10-CM | POA: Diagnosis not present

## 2019-02-28 DIAGNOSIS — I130 Hypertensive heart and chronic kidney disease with heart failure and stage 1 through stage 4 chronic kidney disease, or unspecified chronic kidney disease: Secondary | ICD-10-CM | POA: Diagnosis not present

## 2019-02-28 DIAGNOSIS — I255 Ischemic cardiomyopathy: Secondary | ICD-10-CM | POA: Diagnosis not present

## 2019-02-28 NOTE — Telephone Encounter (Signed)
Attempted  2x to call Almyra Free from [facility] back, no answer, continuous ringing and then phone hangs up with no option to leave message.

## 2019-02-28 NOTE — Telephone Encounter (Signed)
[name], [name] she can be reached at [phone].  Patient is having increased SOB even at rest his O2 is stats at 95%, his lungs sounds are diminished. Maybe in fluid overload. She would like to know if there are any new orders.

## 2019-03-01 NOTE — Telephone Encounter (Signed)
Attempted to contact Almyra Free.  Phone rang several times then call disconnected.  Called and spoke with pt's wife, DPR on file.  Pt has been having increased SOB over the last few weeks, regardless of whether he is wearing his O2 or not.  O2 yesterday while resting was 94-95%.  Pt was sleeping in bed but over the last month has had to move back to the recliner due to increased SOB lying flat.  Wife states fluid in legs is much better than it has been in the past but pt recently complained that he feels his abdomen maybe a little tight.  Hospice has a goal of keeping weight under 230lbs.  Most recent weight was 226lbs but a week or two prior it was 222lbs.  Wife states pt acts fine and is his happy self.  Pt takes Torsemide 80 in AM and 60 at noon.  Has taken Metolazone once a week for the past two weeks.  Will send to Dr. Tamala Julian for review.  Wife gave me the correct number for Almyra Free- [phone].

## 2019-03-01 NOTE — Telephone Encounter (Signed)
Spoke with Dr. Tamala Julian and he wants to get labs before making any changes as pt is already on high doses of diuretics.  He would like to get a CBC, BNP and CMP.  Spoke with Almyra Free and made her aware of orders.  She states she will go out there tomorrow to collect labs.  Provided fax number to send results to.

## 2019-03-02 DIAGNOSIS — I255 Ischemic cardiomyopathy: Secondary | ICD-10-CM | POA: Diagnosis not present

## 2019-03-02 DIAGNOSIS — I2510 Atherosclerotic heart disease of native coronary artery without angina pectoris: Secondary | ICD-10-CM | POA: Diagnosis not present

## 2019-03-02 DIAGNOSIS — I130 Hypertensive heart and chronic kidney disease with heart failure and stage 1 through stage 4 chronic kidney disease, or unspecified chronic kidney disease: Secondary | ICD-10-CM | POA: Diagnosis not present

## 2019-03-02 DIAGNOSIS — I5020 Unspecified systolic (congestive) heart failure: Secondary | ICD-10-CM | POA: Diagnosis not present

## 2019-03-02 DIAGNOSIS — N184 Chronic kidney disease, stage 4 (severe): Secondary | ICD-10-CM | POA: Diagnosis not present

## 2019-03-02 DIAGNOSIS — E1122 Type 2 diabetes mellitus with diabetic chronic kidney disease: Secondary | ICD-10-CM | POA: Diagnosis not present

## 2019-03-07 DIAGNOSIS — N184 Chronic kidney disease, stage 4 (severe): Secondary | ICD-10-CM | POA: Diagnosis not present

## 2019-03-07 DIAGNOSIS — I130 Hypertensive heart and chronic kidney disease with heart failure and stage 1 through stage 4 chronic kidney disease, or unspecified chronic kidney disease: Secondary | ICD-10-CM | POA: Diagnosis not present

## 2019-03-07 DIAGNOSIS — E1122 Type 2 diabetes mellitus with diabetic chronic kidney disease: Secondary | ICD-10-CM | POA: Diagnosis not present

## 2019-03-07 DIAGNOSIS — I5020 Unspecified systolic (congestive) heart failure: Secondary | ICD-10-CM | POA: Diagnosis not present

## 2019-03-07 DIAGNOSIS — I255 Ischemic cardiomyopathy: Secondary | ICD-10-CM | POA: Diagnosis not present

## 2019-03-07 DIAGNOSIS — I2510 Atherosclerotic heart disease of native coronary artery without angina pectoris: Secondary | ICD-10-CM | POA: Diagnosis not present

## 2019-03-08 ENCOUNTER — Telehealth: Payer: Self-pay | Admitting: Interventional Cardiology

## 2019-03-08 NOTE — Telephone Encounter (Signed)
Labs have been received.  They were unable to do the BNP.

## 2019-03-08 NOTE — Telephone Encounter (Signed)
Spoke with [facility] nurse and made her aware that I have not seen them yet.  She said she would contact the office and have them fax them again.

## 2019-03-08 NOTE — Telephone Encounter (Signed)
New message      Home health nurse calling to see if we received labs drawn on 10-8 on this pt.  Please let her know when you get a moment.

## 2019-03-09 NOTE — Telephone Encounter (Signed)
Ok

## 2019-03-10 ENCOUNTER — Encounter: Payer: Self-pay | Admitting: Interventional Cardiology

## 2019-03-10 NOTE — Telephone Encounter (Signed)
error 

## 2019-03-14 DIAGNOSIS — E1122 Type 2 diabetes mellitus with diabetic chronic kidney disease: Secondary | ICD-10-CM | POA: Diagnosis not present

## 2019-03-14 DIAGNOSIS — I2510 Atherosclerotic heart disease of native coronary artery without angina pectoris: Secondary | ICD-10-CM | POA: Diagnosis not present

## 2019-03-14 DIAGNOSIS — I5020 Unspecified systolic (congestive) heart failure: Secondary | ICD-10-CM | POA: Diagnosis not present

## 2019-03-14 DIAGNOSIS — I130 Hypertensive heart and chronic kidney disease with heart failure and stage 1 through stage 4 chronic kidney disease, or unspecified chronic kidney disease: Secondary | ICD-10-CM | POA: Diagnosis not present

## 2019-03-14 DIAGNOSIS — I255 Ischemic cardiomyopathy: Secondary | ICD-10-CM | POA: Diagnosis not present

## 2019-03-14 DIAGNOSIS — N184 Chronic kidney disease, stage 4 (severe): Secondary | ICD-10-CM | POA: Diagnosis not present

## 2019-03-20 ENCOUNTER — Telehealth: Payer: Self-pay | Admitting: Interventional Cardiology

## 2019-03-20 DIAGNOSIS — E1122 Type 2 diabetes mellitus with diabetic chronic kidney disease: Secondary | ICD-10-CM | POA: Diagnosis not present

## 2019-03-20 DIAGNOSIS — I255 Ischemic cardiomyopathy: Secondary | ICD-10-CM | POA: Diagnosis not present

## 2019-03-20 DIAGNOSIS — I5020 Unspecified systolic (congestive) heart failure: Secondary | ICD-10-CM | POA: Diagnosis not present

## 2019-03-20 DIAGNOSIS — I2510 Atherosclerotic heart disease of native coronary artery without angina pectoris: Secondary | ICD-10-CM | POA: Diagnosis not present

## 2019-03-20 DIAGNOSIS — N184 Chronic kidney disease, stage 4 (severe): Secondary | ICD-10-CM | POA: Diagnosis not present

## 2019-03-20 DIAGNOSIS — I130 Hypertensive heart and chronic kidney disease with heart failure and stage 1 through stage 4 chronic kidney disease, or unspecified chronic kidney disease: Secondary | ICD-10-CM | POA: Diagnosis not present

## 2019-03-20 NOTE — Telephone Encounter (Signed)
New message   Patient's wife states that she will need a call back after 11:30am about metolazone (ZAROXOLYN) 5 MG tablet. Please call.

## 2019-03-20 NOTE — Telephone Encounter (Signed)
Finally able to reach wife.  She wanted to check to see if [name] could take his Metolazone.  HH came out and checked labs for [place] recently and creatinine was 2.14 so they were told we couldn't increase diuretics.  [name]'s weight had been down to 226lbs but is back up to 230lbs now.  Wife states he usually stands from his chair and uses the urinal but is now having difficulty standing.  He ends up falling back into the recliner, no injuries.  Denies change to swelling or SOB.  [name] is scheduled to see Dr. Tamala Julian 11/4.  Advised I will send to Dr. Tamala Julian for review.

## 2019-03-20 NOTE — Telephone Encounter (Signed)
Left message to call back  

## 2019-03-20 NOTE — Telephone Encounter (Signed)
Attempted to contact wife 3 more times and call is going straight to VM.

## 2019-03-21 DIAGNOSIS — I2510 Atherosclerotic heart disease of native coronary artery without angina pectoris: Secondary | ICD-10-CM | POA: Diagnosis not present

## 2019-03-21 DIAGNOSIS — I130 Hypertensive heart and chronic kidney disease with heart failure and stage 1 through stage 4 chronic kidney disease, or unspecified chronic kidney disease: Secondary | ICD-10-CM | POA: Diagnosis not present

## 2019-03-21 DIAGNOSIS — E1122 Type 2 diabetes mellitus with diabetic chronic kidney disease: Secondary | ICD-10-CM | POA: Diagnosis not present

## 2019-03-21 DIAGNOSIS — N184 Chronic kidney disease, stage 4 (severe): Secondary | ICD-10-CM | POA: Diagnosis not present

## 2019-03-21 DIAGNOSIS — I5020 Unspecified systolic (congestive) heart failure: Secondary | ICD-10-CM | POA: Diagnosis not present

## 2019-03-21 DIAGNOSIS — I255 Ischemic cardiomyopathy: Secondary | ICD-10-CM | POA: Diagnosis not present

## 2019-03-22 NOTE — Telephone Encounter (Signed)
I spoke with pt's wife and gave her information from Dr Tamala Julian.  Pt was taking metolazone once per week and will resume this dose.  He will follow up with Dr  Tamala Julian as planned on 03/29/19

## 2019-03-22 NOTE — Telephone Encounter (Signed)
Okay to resume metolazone as before to see if it helps breathing. Will likely worsen kidneys but better to breath than worry about kidney numbers.

## 2019-03-24 ENCOUNTER — Other Ambulatory Visit: Payer: Self-pay | Admitting: Cardiology

## 2019-03-26 DIAGNOSIS — E1122 Type 2 diabetes mellitus with diabetic chronic kidney disease: Secondary | ICD-10-CM | POA: Diagnosis not present

## 2019-03-26 DIAGNOSIS — E785 Hyperlipidemia, unspecified: Secondary | ICD-10-CM | POA: Diagnosis not present

## 2019-03-26 DIAGNOSIS — Z9981 Dependence on supplemental oxygen: Secondary | ICD-10-CM | POA: Diagnosis not present

## 2019-03-26 DIAGNOSIS — I255 Ischemic cardiomyopathy: Secondary | ICD-10-CM | POA: Diagnosis not present

## 2019-03-26 DIAGNOSIS — N184 Chronic kidney disease, stage 4 (severe): Secondary | ICD-10-CM | POA: Diagnosis not present

## 2019-03-26 DIAGNOSIS — I480 Paroxysmal atrial fibrillation: Secondary | ICD-10-CM | POA: Diagnosis not present

## 2019-03-26 DIAGNOSIS — I130 Hypertensive heart and chronic kidney disease with heart failure and stage 1 through stage 4 chronic kidney disease, or unspecified chronic kidney disease: Secondary | ICD-10-CM | POA: Diagnosis not present

## 2019-03-26 DIAGNOSIS — E1151 Type 2 diabetes mellitus with diabetic peripheral angiopathy without gangrene: Secondary | ICD-10-CM | POA: Diagnosis not present

## 2019-03-26 DIAGNOSIS — I70209 Unspecified atherosclerosis of native arteries of extremities, unspecified extremity: Secondary | ICD-10-CM | POA: Diagnosis not present

## 2019-03-26 DIAGNOSIS — I5020 Unspecified systolic (congestive) heart failure: Secondary | ICD-10-CM | POA: Diagnosis not present

## 2019-03-26 DIAGNOSIS — K922 Gastrointestinal hemorrhage, unspecified: Secondary | ICD-10-CM | POA: Diagnosis not present

## 2019-03-26 DIAGNOSIS — R0601 Orthopnea: Secondary | ICD-10-CM | POA: Diagnosis not present

## 2019-03-26 DIAGNOSIS — I2510 Atherosclerotic heart disease of native coronary artery without angina pectoris: Secondary | ICD-10-CM | POA: Diagnosis not present

## 2019-03-28 DIAGNOSIS — I5020 Unspecified systolic (congestive) heart failure: Secondary | ICD-10-CM | POA: Diagnosis not present

## 2019-03-28 DIAGNOSIS — I2510 Atherosclerotic heart disease of native coronary artery without angina pectoris: Secondary | ICD-10-CM | POA: Diagnosis not present

## 2019-03-28 DIAGNOSIS — E1122 Type 2 diabetes mellitus with diabetic chronic kidney disease: Secondary | ICD-10-CM | POA: Diagnosis not present

## 2019-03-28 DIAGNOSIS — N184 Chronic kidney disease, stage 4 (severe): Secondary | ICD-10-CM | POA: Diagnosis not present

## 2019-03-28 DIAGNOSIS — I130 Hypertensive heart and chronic kidney disease with heart failure and stage 1 through stage 4 chronic kidney disease, or unspecified chronic kidney disease: Secondary | ICD-10-CM | POA: Diagnosis not present

## 2019-03-28 DIAGNOSIS — I255 Ischemic cardiomyopathy: Secondary | ICD-10-CM | POA: Diagnosis not present

## 2019-03-28 NOTE — Progress Notes (Signed)
Cardiology Office Note:    Date:  03/29/2019   ID:  [name], DOB 05-02-1941, MRN [id number]  PCP:  Lavone Orn, MD  Cardiologist:  Sinclair Grooms, MD   Referring MD: Lavone Orn, MD   Chief Complaint  Patient presents with  . Coronary Artery Disease  . Congestive Heart Failure    History of Present Illness:    [name] is a 78 y.o. male with a hx of  CAD with prior CABG, chronic combined systolic and diastolic heart failure, bilateral carotid artery disease status post endarterectomy, peripheral arterial disease with claudication, hypertension, chronic kidney disease IV, hyperlipidemia, failure to thrive, and Hospice and Palliative care.  [name] is doing relatively well.  He is short of breath if he lays flat.  He denies lower extremity swelling.  They have been keeping his weight near 230 pounds by using metolazone as needed once per week.  He has not had chest pain.  He feels his breathing is stable over the past 6 months.  He is able to ambulate using a walker within his house.  Past Medical History:  Diagnosis Date  . Arthritis   . Atherosclerotic heart disease   . Back pain   . Bruises easily    takes Pletal daily and ASA 325mg  daily  . Cataract immature    right  . Chest pain   . Colon polyps   . Complication of anesthesia    hard to wake up with bypass surgery  . Cyst    on perineum;takes Doxycycline daily  . Diabetes mellitus    takes Glipizide,Metformin,and Actos daily  . Diarrhea   . Dyspnea    when lying flat  . Edema    right leg knee down swollen since fall 3 wks ago  . Foot drop, left   . GERD (gastroesophageal reflux disease)    Rolaids as needed-occ reflux  . Hemorrhoids   . History of kidney stones    at age 57   . Hyperlipidemia    takes Tricor and Lipitor daily  . Hypertension    takes Altace,Amlodipine,and Nadolol daily  . Hypothyroidism   . Kidney stone    35+yrs ago  . Muscle pain   . Nasal polyps    hx of  .  Peripheral edema    wears knee length hose-told by podiatrist to wear these  . Peripheral neuropathy    takes Gabapentin daily  . Pneumonia    as a child  . PVD (peripheral vascular disease) ([place])   . Renal insufficiency   . Seasonal allergies    takes Mucinex and Zyrtec prn  . Urinary frequency    urgency/takes Vesicare daily    Past Surgical History:  Procedure Laterality Date  . Aortobifemoral bypass    . APPLICATION OF WOUND VAC Left 10/25/2017   Procedure: Wound vac placement to left groin.;  Surgeon: Elam Dutch, MD;  Location: [facility] OR;  Service: Vascular;  Laterality: Left;  . APPLICATION OF WOUND VAC Left 11/10/2017   Procedure: APPLICATION OF WOUND VAC;  Surgeon: Elam Dutch, MD;  Location: [place];  Service: Vascular;  Laterality: Left;  . CARDIAC CATHETERIZATION  most recent 05/2011   total of 4  . CARDIOVERSION N/A 03/22/2018   Procedure: CARDIOVERSION;  Surgeon: Jerline Pain, MD;  Location: [facility] ENDOSCOPY;  Service: Cardiovascular;  Laterality: N/A;  . CAROTID ANGIOGRAM N/A 06/05/2011   Procedure: CAROTID Cyril Loosen;  Surgeon: Elam Dutch, MD;  Location: [place] CATH LAB;  Service: Cardiovascular;  Laterality: N/A;  . CAROTID ENDARTERECTOMY  04/08/2011   left CEA  . cataract surgery Bilateral    left  . COLONOSCOPY    . COLONOSCOPY WITH PROPOFOL N/A 08/22/2013   Procedure: COLONOSCOPY WITH PROPOFOL;  Surgeon: Garlan Fair, MD;  Location: [facility] ENDOSCOPY;  Service: Endoscopy;  Laterality: N/A;  . CORONARY ARTERY BYPASS GRAFT  09/09/2011   Procedure: CORONARY ARTERY BYPASS GRAFTING (CABG);  Surgeon: Gaye Pollack, MD;  Location: [place];  Service: Open Heart Surgery;  Laterality: N/A;  Coronary artery bypass grafting  x three with Right saphenous vein harvested endoscopically and left internal mammary artery  . ENDARTERECTOMY  04/08/2011   Procedure: ENDARTERECTOMY CAROTID;  Surgeon: Elam Dutch, MD;  Location: [facility] OR;  Service: Vascular;  Laterality: Left;  with  patch angioplasty  . ENDARTERECTOMY  09/09/2011   Procedure: ENDARTERECTOMY CAROTID;  Surgeon: Elam Dutch, MD;  Location: [facility] OR;  Service: Vascular;  Laterality: Right;  with patch angioplasty  . ESOPHAGOGASTRODUODENOSCOPY (EGD) WITH PROPOFOL N/A 03/10/2018   Procedure: ESOPHAGOGASTRODUODENOSCOPY (EGD) WITH PROPOFOL;  Surgeon: Arta Silence, MD;  Location: [place];  Service: Endoscopy;  Laterality: N/A;  . FEMORAL-POPLITEAL BYPASS GRAFT Left 10/25/2017   Procedure: Left Common Femoral Endarterectomy and perfundaplasty, Left BYPASS GRAFT FEMORAL-BELOW KNEE POPLITEAL ARTERY.;  Surgeon: Elam Dutch, MD;  Location: [place];  Service: Vascular;  Laterality: Left;  . GROIN DEBRIDEMENT Left 11/10/2017   Procedure: GROIN DEBRIDEMENT;  Surgeon: Elam Dutch, MD;  Location: [place];  Service: Vascular;  Laterality: Left;  . LEFT HEART CATHETERIZATION WITH CORONARY ANGIOGRAM N/A 07/09/2011   Procedure: LEFT HEART CATHETERIZATION WITH CORONARY ANGIOGRAM;  Surgeon: Sinclair Grooms, MD;  Location: [facility] CATH LAB;  Service: Cardiovascular;  Laterality: N/A;  . LOWER EXTREMITY ANGIOGRAPHY N/A 10/11/2017   Procedure: LOWER EXTREMITY ANGIOGRAPHY;  Surgeon: Waynetta Sandy, MD;  Location: [place] CV LAB;  Service: Cardiovascular;  Laterality: N/A;  . Reimplantation of inferior mesenteric artery    . Repair of infrarenal abdominal aortic aneurysm    . RIGHT HEART CATH N/A 04/04/2018   Procedure: RIGHT HEART CATH;  Surgeon: Larey Dresser, MD;  Location: [place] CV LAB;  Service: Cardiovascular;  Laterality: N/A;  . SHOULDER ARTHROSCOPY W/ ROTATOR CUFF REPAIR     right and left-open procedures  . TEE WITHOUT CARDIOVERSION N/A 03/22/2018   Procedure: TRANSESOPHAGEAL ECHOCARDIOGRAM (TEE);  Surgeon: Jerline Pain, MD;  Location: [facility] ENDOSCOPY;  Service: Cardiovascular;  Laterality: N/A;  . TONSILLECTOMY     at age 58  . TRIGGER FINGER RELEASE Right 06/28/2014   Procedure: RIGHT LONG  TRIGGER RELEASE ;  Surgeon: Leanora Cover, MD;  Location: [place];  Service: Orthopedics;  Laterality: Right;  . wisdom teeth extracted     as a teenager  . WOUND DEBRIDEMENT Left 11/05/2017   Procedure: DEBRIDEMENT WOUND LEFT GROIN with wound vac placement;  Surgeon: Elam Dutch, MD;  Location: [facility] OR;  Service: Vascular;  Laterality: Left;    Current Medications: Current Meds  Medication Sig  . acetaminophen (TYLENOL) 500 MG tablet Take 1,000 mg by mouth every 6 (six) hours as needed for fever or headache (pain).   Marland Kitchen amiodarone (PACERONE) 200 MG tablet Take 1 tablet (200 mg total) by mouth daily.  Marland Kitchen atorvastatin (LIPITOR) 20 MG tablet Take 20 mg by mouth daily after supper.   . carvedilol (COREG) 3.125 MG tablet Take 1 tablet (  3.125 mg total) by mouth 2 (two) times daily.  . ferrous sulfate 325 (65 FE) MG tablet Take 1 tablet (325 mg total) by mouth 2 (two) times daily with a meal.  . hydrALAZINE (APRESOLINE) 25 MG tablet Take 1 tablet (25 mg total) by mouth every 8 (eight) hours.  Marland Kitchen HYDROcodone-acetaminophen (NORCO/VICODIN) 5-325 MG tablet Take 1 tablet by mouth every 6 (six) hours as needed for moderate pain or severe pain.  . isosorbide mononitrate (IMDUR) 30 MG 24 hr tablet Take 1 tablet (30 mg total) by mouth daily.  Marland Kitchen levothyroxine (SYNTHROID) 112 MCG tablet Take 112 mcg by mouth daily before breakfast.   . LORazepam (ATIVAN) 0.5 MG tablet Take 0.5 mg by mouth at bedtime as needed for anxiety.  . methylcellulose (ARTIFICIAL TEARS) 1 % ophthalmic solution Place 1 drop into both eyes 2 (two) times daily as needed (dry eyes).  . metolazone (ZAROXOLYN) 5 MG tablet Take 5 mg by mouth daily as needed (edema).   . Multiple Vitamin (MULTIVITAMIN WITH MINERALS) TABS tablet Take 1 tablet by mouth daily.  Marland Kitchen MYRBETRIQ 25 MG TB24 tablet Take 25 mg by mouth daily.   . nitroGLYCERIN (NITROSTAT) 0.4 MG SL tablet PLACE 1 TABLET UNDER TONGUE EVERY 5 MINUTES AS NEEDED FOR CHEST PAIN.   Marland Kitchen nystatin (MYCOSTATIN/NYSTOP) powder Apply topically 3 (three) times daily. APPLY TO SKIN FOLDS  . pantoprazole (PROTONIX) 40 MG tablet Take 1 tablet (40 mg total) by mouth daily.  . polyethylene glycol powder (GLYCOLAX/MIRALAX) 17 GM/SCOOP powder Take 0.5 Containers by mouth daily as needed for mild constipation.   . Potassium Chloride ER 20 MEQ TBCR Take 1 tablet by mouth 2 (two) times a day.   . pseudoephedrine-guaifenesin (MUCINEX D) 60-600 MG per tablet Take 1 tablet by mouth every 12 (twelve) hours as needed for congestion.   . SENNA S 8.6-50 MG tablet Take 1 tablet by mouth 2 (two) times daily as needed for mild constipation.   . tamsulosin (FLOMAX) 0.4 MG CAPS capsule Take 0.4 mg by mouth every Monday, Wednesday, and Friday.   . temazepam (RESTORIL) 15 MG capsule Take 15 mg by mouth at bedtime as needed for sleep.   Marland Kitchen torsemide (DEMADEX) 20 MG tablet Take 60-80 mg by mouth 2 (two) times daily. Take 4 tablets (80 mg) in the morning and Take 3 tablets (60 mg) at noon  . TOUJEO SOLOSTAR 300 UNIT/ML SOPN Inject 11 Units into the skin every evening.   . vitamin B-12 (CYANOCOBALAMIN) 500 MCG tablet Take 500 mcg by mouth every morning.      Allergies:   Amoxicillin-pot clavulanate, Morphine and related, Percocet [oxycodone-acetaminophen], and Restoril [temazepam]   Social History   Socioeconomic History  . Marital status: Married    Spouse name: Not on file  . Number of children: 2  . Years of education: Not on file  . Highest education level: Professional school degree (e.g., MD, DDS, DVM, JD)  Occupational History  . Occupation: retired Chief Executive Officer  Social Needs  . Financial resource strain: Not on file  . Food insecurity    Worry: Not on file    Inability: Not on file  . Transportation needs    Medical: Not on file    Non-medical: Not on file  Tobacco Use  . Smoking status: Former Smoker    Years: 40.00    Types: Cigarettes    Quit date: 10/27/2005    Years since quitting: 13.4   . Smokeless tobacco: Never Used  Substance and  Sexual Activity  . Alcohol use: Yes    Alcohol/week: 2.0 - 3.0 standard drinks    Types: 2 - 3 Cans of beer per week    Comment: occasional- weekly  . Drug use: No  . Sexual activity: Not Currently  Lifestyle  . Physical activity    Days per week: Not on file    Minutes per session: Not on file  . Stress: Not on file  Relationships  . Social Herbalist on phone: Patient refused    Gets together: Patient refused    Attends religious service: Patient refused    Active member of club or organization: Patient refused    Attends meetings of clubs or organizations: Patient refused    Relationship status: Patient refused  Other Topics Concern  . Not on file  Social History Narrative   He lives with wife in a 1 story home, three steps to entire home.  2 children.  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       Family History: The patient's family history includes Heart disease in his father and mother. There is no history of Anesthesia problems, Hypotension, Malignant hyperthermia, or Pseudochol deficiency.  ROS:   Please see the history of present illness.    He has had cellulitis since I last saw him and was hospitalized.  No new cardiac imaging was performed.  All other systems reviewed and are negative.  EKGs/Labs/Other Studies Reviewed:    The following studies were reviewed today: No new data  EKG:  EKG not performed  Recent Labs: 04/01/2018: Magnesium 2.1 04/17/2018: B Natriuretic Peptide 2,522.7 12/22/2018: ALT 35 12/26/2018: BUN 61; Creatinine, Ser 1.74; Hemoglobin 11.9; Platelets 217; Potassium 3.4; Sodium 138  Recent Lipid Panel No results found for: CHOL, TRIG, HDL, CHOLHDL, VLDL, LDLCALC, LDLDIRECT  Physical Exam:    VS:  BP 138/74   Pulse 66   Ht 5\' 10"  (1.778 m)   Wt 230 lb 1.9 oz (104.4 kg)   SpO2 93%   BMI 33.02 kg/m     Wt Readings from Last 3 Encounters:  03/29/19 230 lb 1.9 oz (104.4 kg)   01/17/19 233 lb (105.7 kg)  12/22/18 229 lb 8 oz (104.1 kg)     GEN: Chronically ill and obese.. No acute distress HEENT: Normal NECK: No JVD. LYMPHATICS: No lymphadenopathy CARDIAC:  RRR without murmur, gallop, or edema. VASCULAR:  Normal Pulses. No bruits. RESPIRATORY:  Clear to auscultation without rales, wheezing or rhonchi  ABDOMEN: Soft, non-tender, non-distended, No pulsatile mass, MUSCULOSKELETAL: No deformity  SKIN: Warm and dry NEUROLOGIC:  Alert and oriented x 3 PSYCHIATRIC:  Normal affect   ASSESSMENT:    1. Chronic combined systolic and diastolic heart failure ([place])   2. Essential hypertension   3. Paroxysmal atrial fibrillation ([facility])   4. Stage 3b chronic kidney disease   5. PAD (peripheral artery disease) ([place])   6. S/P CABG (coronary artery bypass graft)   7. Type 2 diabetes mellitus with complication, with long-term current use of insulin ([place])   8. Educated about COVID-19 virus infection   9. Long term current use of amiodarone    PLAN:    In order of problems listed above:  1. No significant JVD or edema.  The diuretic regimen seems to be holding volume status stable.  Will get a basic metabolic panel and CBC today and compare with laboratory data obtained a month ago.  No change in diuretic regimen at this time. 2. Blood  pressure is well controlled. 3. No clinical evidence of atrial fibrillation at this point. 4. CKD stage IV present on most recent laboratory data from October 8 where creatinine was 2.14.  This will be rechecked again today. 5. Not addressed 6. No angina 7. Hemoglobin A1c target should be less than 7 8. The 3W's are discussed and endorsed.     Medication Adjustments/Labs and Tests Ordered: Current medicines are reviewed at length with the patient today.  Concerns regarding medicines are outlined above.  Orders Placed This Encounter  Procedures  . Basic metabolic panel  . Pro b natriuretic peptide  . Sedimentation rate  .  Hepatic function panel  . TSH   No orders of the defined types were placed in this encounter.   Patient Instructions  Medication Instructions:  Your physician recommends that you continue on your current medications as directed. Please refer to the Current Medication list given to you today.  *If you need a refill on your cardiac medications before your next appointment, please call your pharmacy*  Lab Work: BMET and Pro BNP today  If you have labs (blood work) drawn today and your tests are completely normal, you will receive your results only by: Marland Kitchen MyChart Message (if you have MyChart) OR . A paper copy in the mail If you have any lab test that is abnormal or we need to change your treatment, we will call you to review the results.  Testing/Procedures: None  Follow-Up: At [facility], you and your health needs are our priority.  As part of our continuing mission to provide you with exceptional heart care, we have created designated Provider Care Teams.  These Care Teams include your primary Cardiologist (physician) and Advanced Practice Providers (APPs -  Physician Assistants and Nurse Practitioners) who all work together to provide you with the care you need, when you need it.  Your next appointment:   6 months  The format for your next appointment:   In Person  Provider:   You may see Sinclair Grooms, MD or one of the following Advanced Practice Providers on your designated Care Team:    Truitt Merle, NP  Cecilie Kicks, NP  Kathyrn Drown, NP   Other Instructions      Signed, Sinclair Grooms, MD  03/29/2019 5:22 PM    [place]

## 2019-03-29 ENCOUNTER — Other Ambulatory Visit: Payer: Self-pay

## 2019-03-29 ENCOUNTER — Ambulatory Visit (INDEPENDENT_AMBULATORY_CARE_PROVIDER_SITE_OTHER): Admitting: Interventional Cardiology

## 2019-03-29 ENCOUNTER — Encounter: Payer: Self-pay | Admitting: Interventional Cardiology

## 2019-03-29 VITALS — BP 138/74 | HR 66 | Ht 70.0 in | Wt 230.1 lb

## 2019-03-29 DIAGNOSIS — Z79899 Other long term (current) drug therapy: Secondary | ICD-10-CM

## 2019-03-29 DIAGNOSIS — Z794 Long term (current) use of insulin: Secondary | ICD-10-CM

## 2019-03-29 DIAGNOSIS — I2510 Atherosclerotic heart disease of native coronary artery without angina pectoris: Secondary | ICD-10-CM | POA: Diagnosis not present

## 2019-03-29 DIAGNOSIS — I480 Paroxysmal atrial fibrillation: Secondary | ICD-10-CM

## 2019-03-29 DIAGNOSIS — I5042 Chronic combined systolic (congestive) and diastolic (congestive) heart failure: Secondary | ICD-10-CM | POA: Diagnosis not present

## 2019-03-29 DIAGNOSIS — I10 Essential (primary) hypertension: Secondary | ICD-10-CM

## 2019-03-29 DIAGNOSIS — E1122 Type 2 diabetes mellitus with diabetic chronic kidney disease: Secondary | ICD-10-CM | POA: Diagnosis not present

## 2019-03-29 DIAGNOSIS — Z951 Presence of aortocoronary bypass graft: Secondary | ICD-10-CM

## 2019-03-29 DIAGNOSIS — I739 Peripheral vascular disease, unspecified: Secondary | ICD-10-CM

## 2019-03-29 DIAGNOSIS — I5020 Unspecified systolic (congestive) heart failure: Secondary | ICD-10-CM | POA: Diagnosis not present

## 2019-03-29 DIAGNOSIS — N1832 Chronic kidney disease, stage 3b: Secondary | ICD-10-CM

## 2019-03-29 DIAGNOSIS — I130 Hypertensive heart and chronic kidney disease with heart failure and stage 1 through stage 4 chronic kidney disease, or unspecified chronic kidney disease: Secondary | ICD-10-CM | POA: Diagnosis not present

## 2019-03-29 DIAGNOSIS — I255 Ischemic cardiomyopathy: Secondary | ICD-10-CM | POA: Diagnosis not present

## 2019-03-29 DIAGNOSIS — Z7189 Other specified counseling: Secondary | ICD-10-CM

## 2019-03-29 DIAGNOSIS — N184 Chronic kidney disease, stage 4 (severe): Secondary | ICD-10-CM | POA: Diagnosis not present

## 2019-03-29 DIAGNOSIS — E118 Type 2 diabetes mellitus with unspecified complications: Secondary | ICD-10-CM

## 2019-03-29 NOTE — Patient Instructions (Signed)
Medication Instructions:  Your physician recommends that you continue on your current medications as directed. Please refer to the Current Medication list given to you today.  *If you need a refill on your cardiac medications before your next appointment, please call your pharmacy*  Lab Work: BMET and Pro BNP today  If you have labs (blood work) drawn today and your tests are completely normal, you will receive your results only by: Marland Kitchen MyChart Message (if you have MyChart) OR . A paper copy in the mail If you have any lab test that is abnormal or we need to change your treatment, we will call you to review the results.  Testing/Procedures: None  Follow-Up: At [facility], you and your health needs are our priority.  As part of our continuing mission to provide you with exceptional heart care, we have created designated Provider Care Teams.  These Care Teams include your primary Cardiologist (physician) and Advanced Practice Providers (APPs -  Physician Assistants and Nurse Practitioners) who all work together to provide you with the care you need, when you need it.  Your next appointment:   6 months  The format for your next appointment:   In Person  Provider:   You may see Sinclair Grooms, MD or one of the following Advanced Practice Providers on your designated Care Team:    Truitt Merle, NP  Cecilie Kicks, NP  Kathyrn Drown, NP   Other Instructions

## 2019-03-30 ENCOUNTER — Other Ambulatory Visit: Payer: Self-pay | Admitting: *Deleted

## 2019-03-30 DIAGNOSIS — I5042 Chronic combined systolic (congestive) and diastolic (congestive) heart failure: Secondary | ICD-10-CM

## 2019-03-30 LAB — TSH: TSH: 8.26 u[IU]/mL — ABNORMAL HIGH (ref 0.450–4.500)

## 2019-03-30 LAB — HEPATIC FUNCTION PANEL
ALT: 39 IU/L (ref 0–44)
AST: 47 IU/L — ABNORMAL HIGH (ref 0–40)
Albumin: 3.2 g/dL — ABNORMAL LOW (ref 3.7–4.7)
Alkaline Phosphatase: 334 IU/L — ABNORMAL HIGH (ref 39–117)
Bilirubin Total: 0.9 mg/dL (ref 0.0–1.2)
Bilirubin, Direct: 0.48 mg/dL — ABNORMAL HIGH (ref 0.00–0.40)
Total Protein: 6.1 g/dL (ref 6.0–8.5)

## 2019-03-30 LAB — BASIC METABOLIC PANEL
BUN/Creatinine Ratio: 25 — ABNORMAL HIGH (ref 10–24)
BUN: 52 mg/dL — ABNORMAL HIGH (ref 8–27)
CO2: 24 mmol/L (ref 20–29)
Calcium: 9.4 mg/dL (ref 8.6–10.2)
Chloride: 95 mmol/L — ABNORMAL LOW (ref 96–106)
Creatinine, Ser: 2.12 mg/dL — ABNORMAL HIGH (ref 0.76–1.27)
GFR calc Af Amer: 33 mL/min/{1.73_m2} — ABNORMAL LOW (ref >59)
GFR calc non Af Amer: 29 mL/min/{1.73_m2} — ABNORMAL LOW (ref >59)
Glucose: 141 mg/dL — ABNORMAL HIGH (ref 65–99)
Potassium: 3.6 mmol/L (ref 3.5–5.2)
Sodium: 134 mmol/L (ref 134–144)

## 2019-03-30 LAB — PRO B NATRIURETIC PEPTIDE: NT-Pro BNP: 26871 pg/mL — ABNORMAL HIGH (ref 0–486)

## 2019-03-30 LAB — SEDIMENTATION RATE: Sed Rate: 55 mm/hr — ABNORMAL HIGH (ref 0–30)

## 2019-04-04 DIAGNOSIS — I5020 Unspecified systolic (congestive) heart failure: Secondary | ICD-10-CM | POA: Diagnosis not present

## 2019-04-04 DIAGNOSIS — I130 Hypertensive heart and chronic kidney disease with heart failure and stage 1 through stage 4 chronic kidney disease, or unspecified chronic kidney disease: Secondary | ICD-10-CM | POA: Diagnosis not present

## 2019-04-04 DIAGNOSIS — I272 Pulmonary hypertension, unspecified: Secondary | ICD-10-CM | POA: Diagnosis not present

## 2019-04-04 DIAGNOSIS — E1122 Type 2 diabetes mellitus with diabetic chronic kidney disease: Secondary | ICD-10-CM | POA: Diagnosis not present

## 2019-04-04 DIAGNOSIS — I2510 Atherosclerotic heart disease of native coronary artery without angina pectoris: Secondary | ICD-10-CM | POA: Diagnosis not present

## 2019-04-04 DIAGNOSIS — E1142 Type 2 diabetes mellitus with diabetic polyneuropathy: Secondary | ICD-10-CM | POA: Diagnosis not present

## 2019-04-04 DIAGNOSIS — I255 Ischemic cardiomyopathy: Secondary | ICD-10-CM | POA: Diagnosis not present

## 2019-04-04 DIAGNOSIS — I5040 Unspecified combined systolic (congestive) and diastolic (congestive) heart failure: Secondary | ICD-10-CM | POA: Diagnosis not present

## 2019-04-04 DIAGNOSIS — N184 Chronic kidney disease, stage 4 (severe): Secondary | ICD-10-CM | POA: Diagnosis not present

## 2019-04-07 ENCOUNTER — Other Ambulatory Visit: Payer: Medicare HMO | Admitting: *Deleted

## 2019-04-07 ENCOUNTER — Other Ambulatory Visit: Payer: Self-pay

## 2019-04-07 DIAGNOSIS — I2510 Atherosclerotic heart disease of native coronary artery without angina pectoris: Secondary | ICD-10-CM | POA: Diagnosis not present

## 2019-04-07 DIAGNOSIS — I5042 Chronic combined systolic (congestive) and diastolic (congestive) heart failure: Secondary | ICD-10-CM

## 2019-04-07 DIAGNOSIS — I255 Ischemic cardiomyopathy: Secondary | ICD-10-CM | POA: Diagnosis not present

## 2019-04-07 DIAGNOSIS — E1122 Type 2 diabetes mellitus with diabetic chronic kidney disease: Secondary | ICD-10-CM | POA: Diagnosis not present

## 2019-04-07 DIAGNOSIS — I130 Hypertensive heart and chronic kidney disease with heart failure and stage 1 through stage 4 chronic kidney disease, or unspecified chronic kidney disease: Secondary | ICD-10-CM | POA: Diagnosis not present

## 2019-04-07 DIAGNOSIS — I5020 Unspecified systolic (congestive) heart failure: Secondary | ICD-10-CM | POA: Diagnosis not present

## 2019-04-07 DIAGNOSIS — N184 Chronic kidney disease, stage 4 (severe): Secondary | ICD-10-CM | POA: Diagnosis not present

## 2019-04-08 LAB — BASIC METABOLIC PANEL
BUN/Creatinine Ratio: 25 — ABNORMAL HIGH (ref 10–24)
BUN: 46 mg/dL — ABNORMAL HIGH (ref 8–27)
CO2: 27 mmol/L (ref 20–29)
Calcium: 10 mg/dL (ref 8.6–10.2)
Chloride: 95 mmol/L — ABNORMAL LOW (ref 96–106)
Creatinine, Ser: 1.83 mg/dL — ABNORMAL HIGH (ref 0.76–1.27)
GFR calc Af Amer: 40 mL/min/{1.73_m2} — ABNORMAL LOW (ref >59)
GFR calc non Af Amer: 34 mL/min/{1.73_m2} — ABNORMAL LOW (ref >59)
Glucose: 125 mg/dL — ABNORMAL HIGH (ref 65–99)
Potassium: 3.5 mmol/L (ref 3.5–5.2)
Sodium: 136 mmol/L (ref 134–144)

## 2019-04-11 DIAGNOSIS — I2510 Atherosclerotic heart disease of native coronary artery without angina pectoris: Secondary | ICD-10-CM | POA: Diagnosis not present

## 2019-04-11 DIAGNOSIS — I255 Ischemic cardiomyopathy: Secondary | ICD-10-CM | POA: Diagnosis not present

## 2019-04-11 DIAGNOSIS — I5020 Unspecified systolic (congestive) heart failure: Secondary | ICD-10-CM | POA: Diagnosis not present

## 2019-04-11 DIAGNOSIS — E1122 Type 2 diabetes mellitus with diabetic chronic kidney disease: Secondary | ICD-10-CM | POA: Diagnosis not present

## 2019-04-11 DIAGNOSIS — N184 Chronic kidney disease, stage 4 (severe): Secondary | ICD-10-CM | POA: Diagnosis not present

## 2019-04-11 DIAGNOSIS — I130 Hypertensive heart and chronic kidney disease with heart failure and stage 1 through stage 4 chronic kidney disease, or unspecified chronic kidney disease: Secondary | ICD-10-CM | POA: Diagnosis not present

## 2019-04-12 DIAGNOSIS — I5020 Unspecified systolic (congestive) heart failure: Secondary | ICD-10-CM | POA: Diagnosis not present

## 2019-04-12 DIAGNOSIS — I2510 Atherosclerotic heart disease of native coronary artery without angina pectoris: Secondary | ICD-10-CM | POA: Diagnosis not present

## 2019-04-12 DIAGNOSIS — I255 Ischemic cardiomyopathy: Secondary | ICD-10-CM | POA: Diagnosis not present

## 2019-04-12 DIAGNOSIS — I130 Hypertensive heart and chronic kidney disease with heart failure and stage 1 through stage 4 chronic kidney disease, or unspecified chronic kidney disease: Secondary | ICD-10-CM | POA: Diagnosis not present

## 2019-04-12 DIAGNOSIS — N184 Chronic kidney disease, stage 4 (severe): Secondary | ICD-10-CM | POA: Diagnosis not present

## 2019-04-12 DIAGNOSIS — E1122 Type 2 diabetes mellitus with diabetic chronic kidney disease: Secondary | ICD-10-CM | POA: Diagnosis not present

## 2019-04-17 DIAGNOSIS — I2510 Atherosclerotic heart disease of native coronary artery without angina pectoris: Secondary | ICD-10-CM | POA: Diagnosis not present

## 2019-04-17 DIAGNOSIS — I255 Ischemic cardiomyopathy: Secondary | ICD-10-CM | POA: Diagnosis not present

## 2019-04-17 DIAGNOSIS — E1122 Type 2 diabetes mellitus with diabetic chronic kidney disease: Secondary | ICD-10-CM | POA: Diagnosis not present

## 2019-04-17 DIAGNOSIS — I5020 Unspecified systolic (congestive) heart failure: Secondary | ICD-10-CM | POA: Diagnosis not present

## 2019-04-17 DIAGNOSIS — I130 Hypertensive heart and chronic kidney disease with heart failure and stage 1 through stage 4 chronic kidney disease, or unspecified chronic kidney disease: Secondary | ICD-10-CM | POA: Diagnosis not present

## 2019-04-17 DIAGNOSIS — N184 Chronic kidney disease, stage 4 (severe): Secondary | ICD-10-CM | POA: Diagnosis not present

## 2019-04-24 ENCOUNTER — Ambulatory Visit: Payer: Medicare HMO | Admitting: Interventional Cardiology

## 2019-06-05 ENCOUNTER — Telehealth: Payer: Self-pay | Admitting: Interventional Cardiology

## 2019-06-05 NOTE — Telephone Encounter (Signed)
New Message      We are recommending the COVID-19 vaccine to all of our patients. Cardiac medications (including blood thinners) should not deter anyone from being vaccinated and there is no need to hold any of those medications prior to vaccine administration.     Currently, there is a hotline to call (active 06/02/19) to schedule vaccination appointments as no walk-ins will be accepted.   Number: [phone]    If you have further questions or concerns about the vaccine process, please visit [email or web address] or contact your primary care physician.

## 2019-06-19 ENCOUNTER — Ambulatory Visit: Payer: Medicare HMO | Attending: Internal Medicine

## 2019-06-19 DIAGNOSIS — Z23 Encounter for immunization: Secondary | ICD-10-CM | POA: Insufficient documentation

## 2019-06-19 NOTE — Progress Notes (Signed)
   Covid-19 Vaccination Clinic  Name:  [name]    MRN: [id number] DOB: [birth year]  06/19/2019  Mr. [name] was observed post Covid-19 immunization for 15 minutes without incidence. He was provided with Vaccine Information Sheet and instruction to access the V-Safe system.   Mr. [name] was instructed to call 911 with any severe reactions post vaccine: Marland Kitchen Difficulty breathing  . Swelling of your face and throat  . A fast heartbeat  . A bad rash all over your body  . Dizziness and weakness    Immunizations Administered    Name Date Dose VIS Date Route   Pfizer COVID-19 Vaccine 06/19/2019  6:20 PM 0.3 mL 05/05/2019 Intramuscular   Manufacturer: [place]   Lot: [id number]   [place]: [id number]

## 2019-06-27 ENCOUNTER — Other Ambulatory Visit: Payer: Self-pay | Admitting: Physician Assistant

## 2019-07-04 DIAGNOSIS — L0231 Cutaneous abscess of buttock: Secondary | ICD-10-CM | POA: Diagnosis not present

## 2019-07-05 IMAGING — MR MR ABDOMEN WO/W CM MRCP
19 of 20 series · 45 of 48 positions shown · IV contrast (Gadavist)
Comparison: Abdominopelvic CT 03/08/2018.

CLINICAL DATA: Abdominal pain, dark stools and pancreatitis.
History of diabetes, hypertension and renal insufficiency.

EXAM:
MRI ABDOMEN WITHOUT AND WITH CONTRAST (INCLUDING MRCP)
TECHNIQUE: Multiplanar multisequence MR imaging of the abdomen was performed
both before and after the administration of intravenous contrast.
Heavily T2-weighted images of the biliary and pancreatic ducts were
obtained, and three-dimensional MRCP images were rendered by post
processing.
CONTRAST:  10 cc Gadavist

[Series 4: ax haste · axial · 6.0mm · 1.38mm/px · 1 of 40 slices shown]
[im 1/40]
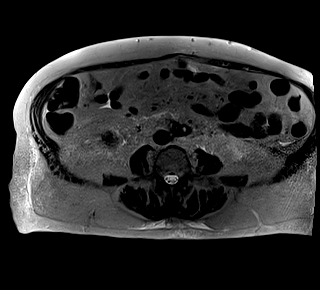

[Series 5: bSSFP · coronal · 6.0mm · 0.86mm/px · 1 of 33 slices shown]
[im 1/33]
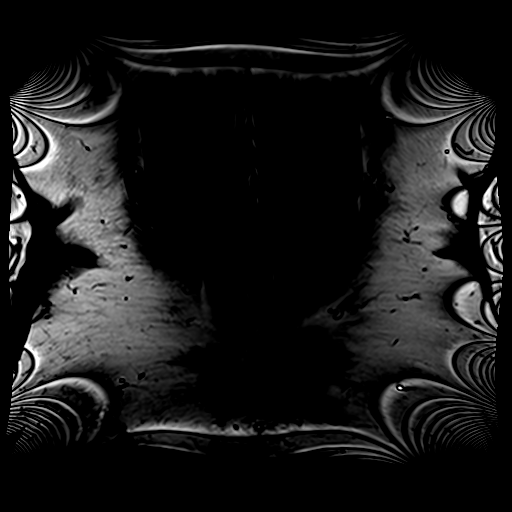

[Series 8: T2 fat-sat · axial · 6.0mm · 1.38mm/px · 1 of 35 slices shown]
[im 1/35]
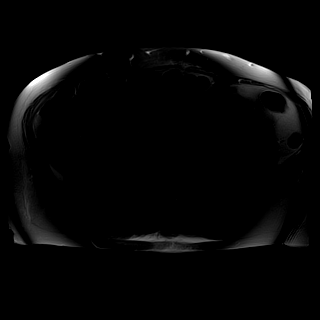

[Series 11: t2_space_cor_p2_trigresp_384_iso · coronal · 1.3mm · 0.52mm/px · 1 of 62 slices shown]
[im 1/62]
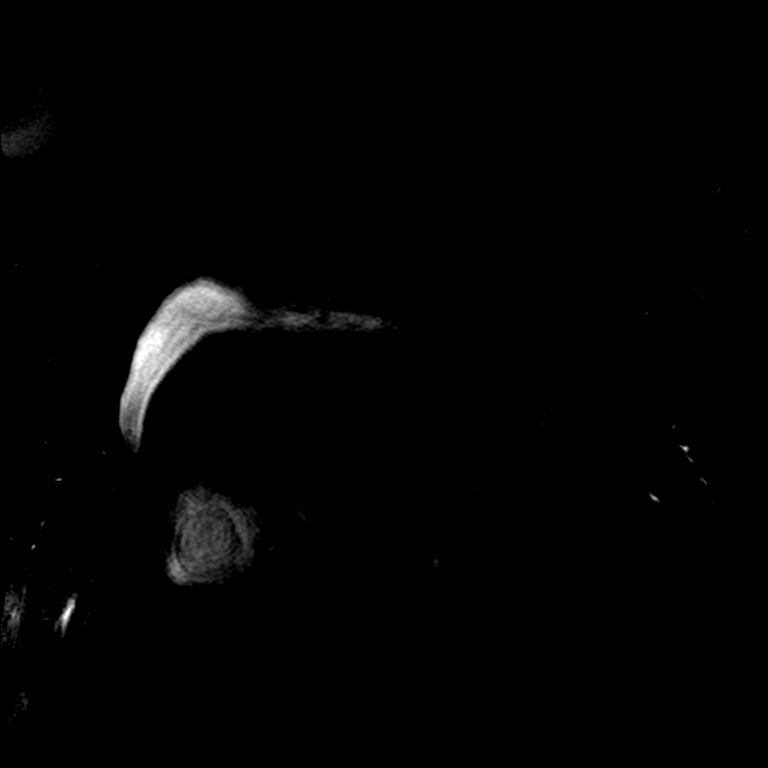

[Series 12: t2_space_cor_p2_trigresp_384_iso_mip_radials · sagittal · 0.52mm/px · 1 of 19 slices shown]
[im 1/19]
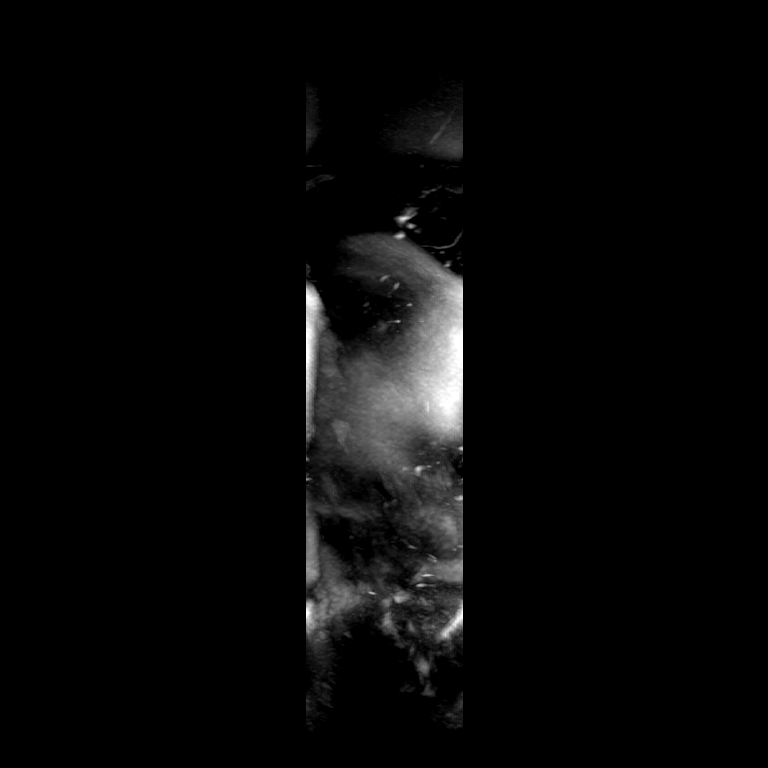

[Series 13: DWI · axial · 6.0mm · 1.64mm/px · z∈[-159,+86]mm · 3 of 104 slices shown (1 of 2)]
[im 1/104]
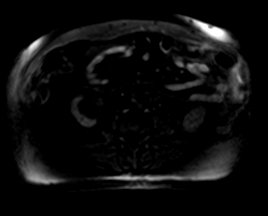
[im 52/104]
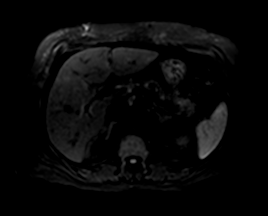
[im 104/104]
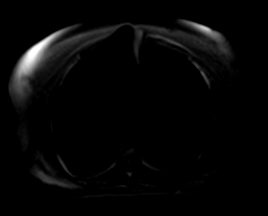

[Series 14: DWI · axial · 6.0mm · 1.64mm/px · 1 of 35 slices shown (2 of 2)]
[im 1/35]
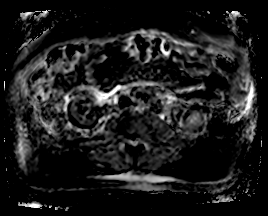

[Series 15: ax in and · axial · 3.0mm · 1.53mm/px · z∈[-152,+85]mm · 6 of 160 slices shown]
[im 1/160]
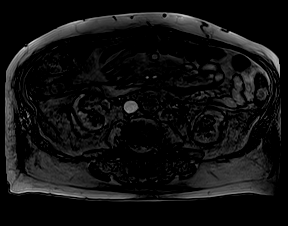
[im 32/160]
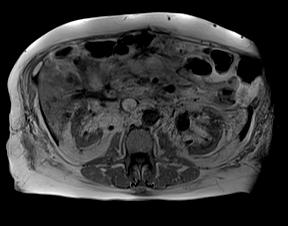
[im 64/160]
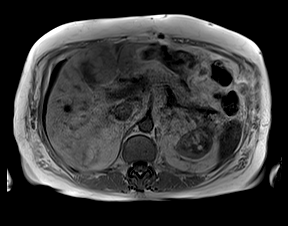
[im 96/160]
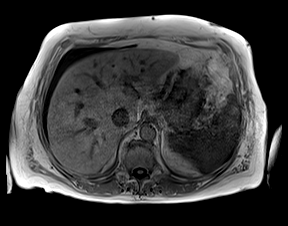
[im 128/160]
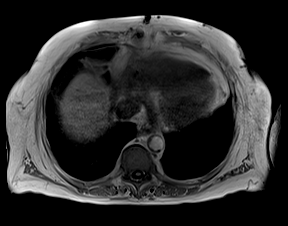
[im 160/160]
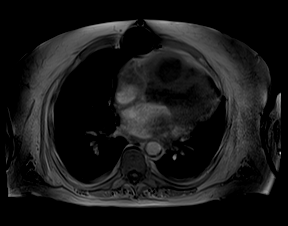

[Series 16: radials · coronal · 50.0mm · 0.83mm/px · 1 of 5 slices shown]
[im 1/5]
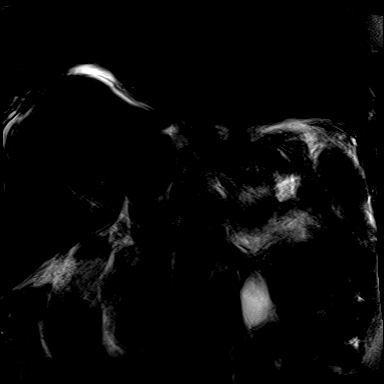

[Series 18: T1 dynamic · axial · non-contrast · 3.0mm · 1.72mm/px · z∈[-149,+88]mm · 3 of 80 slices shown (1 of 5)]
[im 1/80]
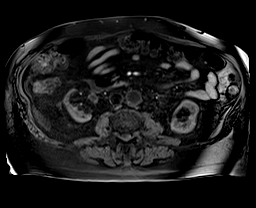
[im 40/80]
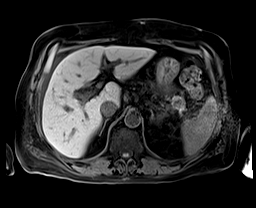
[im 80/80]
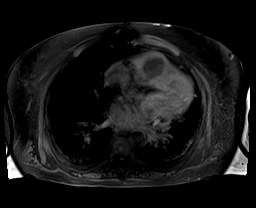

[Series 20: T1 dynamic post-contrast · axial · 3.0mm · 1.72mm/px · z∈[-149,+88]mm · 3 of 80 slices shown (1 of 5)]
[im 1/80]
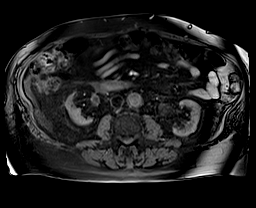
[im 40/80]
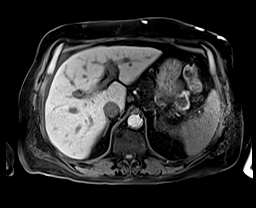
[im 80/80]
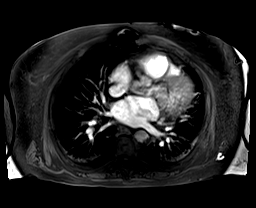

[Series 21: T1 dynamic · axial · 3.0mm · 1.72mm/px · z∈[-149,+88]mm · 3 of 70 slices shown (2 of 5)]
[im 1/70]
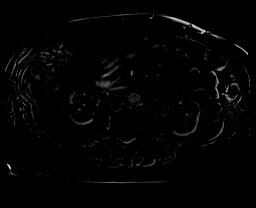
[im 35/70]
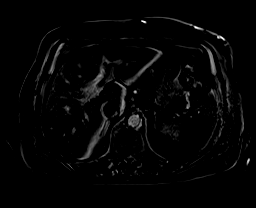
[im 70/70]
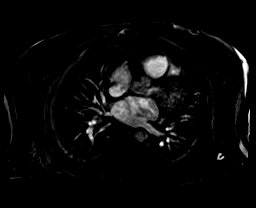

[Series 22: T1 dynamic post-contrast · axial · 3.0mm · 1.72mm/px · z∈[-149,+88]mm · 3 of 80 slices shown (2 of 5)]
[im 1/80]
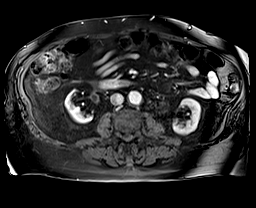
[im 40/80]
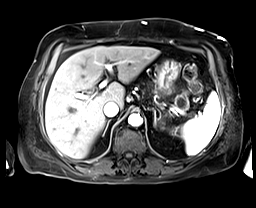
[im 80/80]
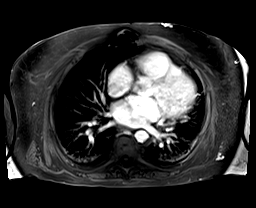

[Series 23: T1 dynamic · axial · 3.0mm · 1.72mm/px · z∈[-149,+88]mm · 3 of 80 slices shown (3 of 5)]
[im 1/80]
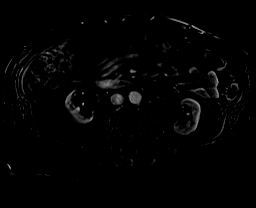
[im 40/80]
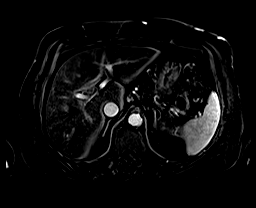
[im 80/80]
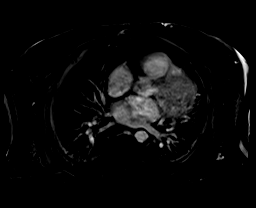

[Series 24: T1 dynamic post-contrast · axial · 3.0mm · 1.72mm/px · z∈[-149,+88]mm · 3 of 80 slices shown (3 of 5)]
[im 1/80]
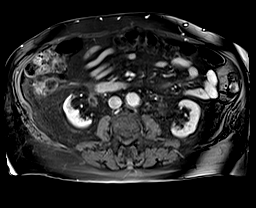
[im 40/80]
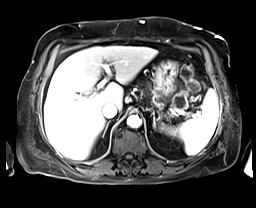
[im 80/80]
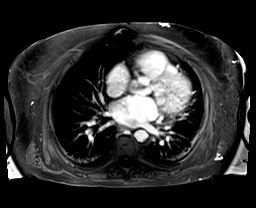

[Series 25: T1 dynamic · axial · 3.0mm · 1.72mm/px · z∈[-149,+88]mm · 3 of 80 slices shown (4 of 5)]
[im 1/80]
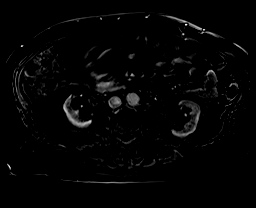
[im 40/80]
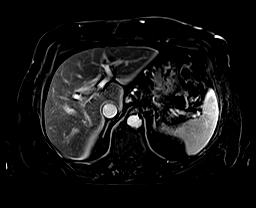
[im 80/80]
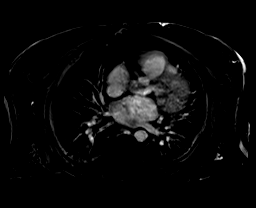

[Series 26: T1 dynamic post-contrast · axial · 3.0mm · 1.72mm/px · z∈[-149,+88]mm · 3 of 80 slices shown (4 of 5)]
[im 1/80]
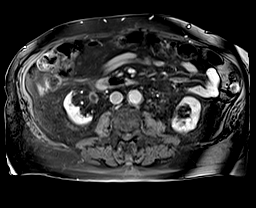
[im 40/80]
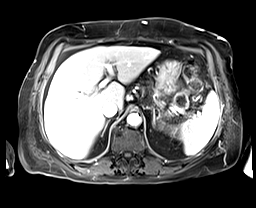
[im 80/80]
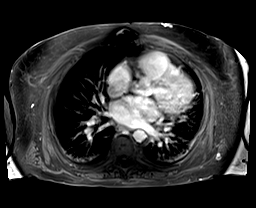

[Series 27: T1 dynamic · axial · 3.0mm · 1.72mm/px · z∈[-149,+88]mm · 3 of 80 slices shown (5 of 5)]
[im 1/80]
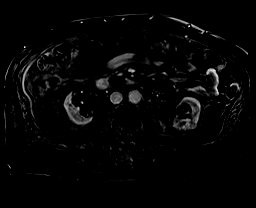
[im 40/80]
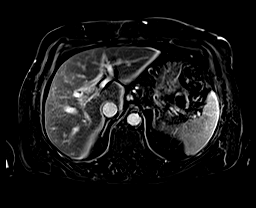
[im 80/80]
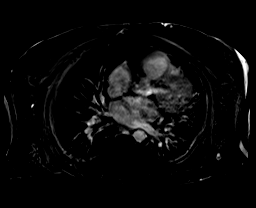

[Series 28: T1 dynamic post-contrast · coronal · 3.0mm · 1.56mm/px · 2 of 72 slices shown (5 of 5)]
[im 1/72]
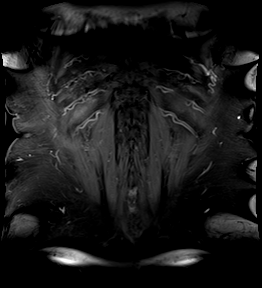
[im 36/72]
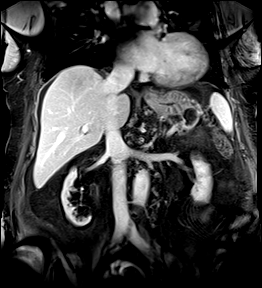

[45 of 48 positions shown; findings below may reference images not displayed]

FINDINGS: Despite efforts by the technologist and patient, mild motion
artifact is present on today's exam and could not be eliminated.
This reduces exam sensitivity and specificity. The thin section MRCP
images are essentially nondiagnostic.

Lower chest: Small dependent bilateral pleural effusions. The heart
is enlarged status post median sternotomy.

Hepatobiliary: The liver demonstrates mild periportal edema, but no
significant steatosis, focal lesion or abnormal enhancement. There
is some pericholecystic fluid, but no gallbladder wall thickening,
abnormal enhancement or biliary dilatation. As above, the thin
section dedicated MRCP images are limited.

Pancreas: As demonstrated on recent CT, there is a cystic mass
associated with the pancreatic tail. This measures 3.2 x 2.7 x
cm and demonstrates central high T2 signal and mild thin peripheral
enhancement. This lesion is contiguous with both the pancreatic tail
and the proximal stomach on the coronal images. The remainder of the
pancreas appears normal. There is no pancreatic ductal dilatation or
other surrounding fluid collection.

Spleen: Normal in size without focal abnormality.

Adrenals/Urinary Tract: Both adrenal glands appear normal. Both
kidneys demonstrate cortical thinning and small cysts. 13 mm lesion
in the upper pole of the left kidney demonstrates low T2 signal, but
no enhancement following contrast, consistent with a hemorrhagic
cyst. Unfortunately, the 14 mm exophytic lesion involving the lower
pole of the left kidney is not fully imaged in the axial plane and
is incompletely characterized. This lesion also demonstrates low T2
signal, although has higher signal on the postcontrast coronal
images (image 26/28). This signal is indeterminate for enhancement
as the precontrast images do not encompass this lesion.

Stomach/Bowel: No evidence of bowel wall thickening, distention or
surrounding inflammatory change.As above, the cystic mass involving
the pancreatic tail abuts the proximal stomach.

Vascular/Lymphatic: There are no enlarged abdominal lymph nodes.
Extensive aortic and branch vessel atherosclerosis, better
demonstrated on CT. Patient is status post aortobifemoral grafting.

Other: Mild anasarca with mild diffuse soft tissue edema and a small
amount of ascites. The edema appears greatest around the pancreas
and gallbladder.

Musculoskeletal: No acute or significant osseous findings. Schmorl's
nodes are noted in the spine.
IMPRESSION: 1. Study is moderately motion degraded, especially on the thin
section MRCP images. No biliary or pancreatic ductal dilatation is
demonstrated.
2. Previously demonstrated lesion involving the pancreatic tail
remains indeterminate, largely cystic in nature, but with thin
peripheral enhancement. This could reflect a postinflammatory
pseudocyst, although given normal recent serum lipase levels, cystic
pancreatic neoplasm or necrotic gastrointestinal stromal tumor of
the stomach remain potential explanations. Follow-up CT or MRI in 3
months recommended.
3. Previously described left renal lesions are likely hemorrhagic
cysts, although the lower pole lesion is incompletely visualized on
the axial images. Attention on follow-up recommended.
4. Anasarca with generalized edema, ascites and bilateral pleural
effusions. There is pericholecystic fluid without gallbladder wall
thickening or abnormal enhancement.

## 2019-07-12 ENCOUNTER — Ambulatory Visit: Payer: Medicare HMO

## 2019-07-14 ENCOUNTER — Ambulatory Visit: Payer: Medicare HMO

## 2019-07-16 ENCOUNTER — Ambulatory Visit: Payer: Medicare HMO | Attending: Internal Medicine

## 2019-07-16 DIAGNOSIS — Z23 Encounter for immunization: Secondary | ICD-10-CM | POA: Insufficient documentation

## 2019-07-16 NOTE — Progress Notes (Signed)
   Covid-[facility] Vaccination [facility]  Name:  [name]    MRN: [id number] DOB: 03-11-1941  07/16/2019  [name] was observed post Covid-19 immunization for 15 minutes without incidence. He was provided with Vaccine Information Sheet and instruction to access the [employer] system.   [name] was instructed to call 911 with any severe reactions post vaccine: Marland Kitchen Difficulty breathing  . Swelling of your face and throat  . A fast heartbeat  . A bad rash all over your body  . Dizziness and weakness    Immunizations Administered    Name Date Dose VIS Date Route   Pfizer COVID-19 Vaccine 07/16/2019  1:14 PM 0.3 mL 05/05/2019 Intramuscular   Manufacturer: [place]   Lot: [id number]   [place]: [id number]

## 2019-10-24 DEATH — deceased

## 2019-12-06 ENCOUNTER — Ambulatory Visit: Payer: Medicare HMO | Admitting: Interventional Cardiology
